# Patient Record
Sex: Male | Born: 1960
Health system: Southern US, Community
[De-identification: ages and names within clinical notes are randomized; demographics above are authoritative.]

## PROBLEM LIST (undated history)

## (undated) DIAGNOSIS — I2510 Atherosclerotic heart disease of native coronary artery without angina pectoris: Secondary | ICD-10-CM

## (undated) DIAGNOSIS — R519 Headache, unspecified: Secondary | ICD-10-CM

## (undated) DIAGNOSIS — H3530 Unspecified macular degeneration: Secondary | ICD-10-CM

## (undated) DIAGNOSIS — I639 Cerebral infarction, unspecified: Secondary | ICD-10-CM

## (undated) DIAGNOSIS — I011 Acute rheumatic endocarditis: Secondary | ICD-10-CM

## (undated) DIAGNOSIS — I5022 Chronic systolic (congestive) heart failure: Secondary | ICD-10-CM

## (undated) DIAGNOSIS — E7800 Pure hypercholesterolemia, unspecified: Secondary | ICD-10-CM

## (undated) DIAGNOSIS — F32A Depression, unspecified: Secondary | ICD-10-CM

## (undated) DIAGNOSIS — Z96651 Presence of right artificial knee joint: Secondary | ICD-10-CM

## (undated) DIAGNOSIS — G4730 Sleep apnea, unspecified: Secondary | ICD-10-CM

## (undated) DIAGNOSIS — J342 Deviated nasal septum: Secondary | ICD-10-CM

## (undated) DIAGNOSIS — M7542 Impingement syndrome of left shoulder: Secondary | ICD-10-CM

## (undated) DIAGNOSIS — M069 Rheumatoid arthritis, unspecified: Secondary | ICD-10-CM

## (undated) DIAGNOSIS — E785 Hyperlipidemia, unspecified: Secondary | ICD-10-CM

## (undated) DIAGNOSIS — I429 Cardiomyopathy, unspecified: Secondary | ICD-10-CM

## (undated) DIAGNOSIS — F419 Anxiety disorder, unspecified: Secondary | ICD-10-CM

## (undated) HISTORY — PX: TONSILLECTOMY: SUR1361

## (undated) HISTORY — PX: KNEE ARTHROSCOPY: SUR90

## (undated) HISTORY — PX: ANKLE SURGERY: SHX546

## (undated) HISTORY — PX: FRACTURE SURGERY: SHX138

## (undated) HISTORY — DX: Cerebral infarction, unspecified: I63.9

## (undated) HISTORY — DX: Hyperlipidemia, unspecified: E78.5

## (undated) HISTORY — DX: Presence of right artificial knee joint: Z96.651

## (undated) HISTORY — DX: Impingement syndrome of left shoulder: M75.42

## (undated) HISTORY — DX: Deviated nasal septum: J34.2

## (undated) HISTORY — DX: Sleep apnea, unspecified: G47.30

## (undated) HISTORY — DX: Atherosclerotic heart disease of native coronary artery without angina pectoris: I25.10

## (undated) HISTORY — DX: Chronic systolic (congestive) heart failure: I50.22

---

## 2007-06-20 ENCOUNTER — Encounter: Admission: RE | Admit: 2007-06-20 | Discharge: 2007-06-20 | Payer: Self-pay | Admitting: Orthopedic Surgery

## 2008-08-30 ENCOUNTER — Encounter: Admission: RE | Admit: 2008-08-30 | Discharge: 2008-08-30 | Payer: Self-pay | Admitting: Internal Medicine

## 2009-07-25 ENCOUNTER — Ambulatory Visit (HOSPITAL_BASED_OUTPATIENT_CLINIC_OR_DEPARTMENT_OTHER): Admission: RE | Admit: 2009-07-25 | Discharge: 2009-07-25 | Payer: Self-pay | Admitting: Orthopedic Surgery

## 2010-07-29 LAB — BASIC METABOLIC PANEL
Chloride: 104 mEq/L (ref 96–112)
GFR calc Af Amer: >60 mL/min (ref >60)
Potassium: 4.3 mEq/L (ref 3.5–5.1)

## 2010-07-29 LAB — POCT HEMOGLOBIN-HEMACUE: Hemoglobin: 13.2 g/dL (ref 13.0–17.0)

## 2010-10-31 ENCOUNTER — Other Ambulatory Visit: Payer: Self-pay | Admitting: Rheumatology

## 2010-10-31 DIAGNOSIS — R0602 Shortness of breath: Secondary | ICD-10-CM

## 2010-11-01 ENCOUNTER — Ambulatory Visit
Admission: RE | Admit: 2010-11-01 | Discharge: 2010-11-01 | Disposition: A | Discharge status: Home / Self Care | Payer: 59 | Source: Ambulatory Visit | Attending: Rheumatology | Admitting: Rheumatology

## 2010-11-01 ENCOUNTER — Other Ambulatory Visit: Payer: Self-pay | Admitting: Rheumatology

## 2010-11-01 DIAGNOSIS — R0602 Shortness of breath: Secondary | ICD-10-CM

## 2010-11-01 MED ORDER — IOHEXOL 300 MG/ML  SOLN
100.0000 mL | Freq: Once | INTRAMUSCULAR | Status: AC | PRN
  Administered 2010-11-01: 100 mL via INTRAVENOUS

## 2010-11-04 ENCOUNTER — Other Ambulatory Visit: Payer: Self-pay | Admitting: Rheumatology

## 2010-11-20 ENCOUNTER — Ambulatory Visit
Admission: RE | Admit: 2010-11-20 | Discharge: 2010-11-20 | Disposition: A | Discharge status: Home / Self Care | Payer: 59 | Source: Ambulatory Visit | Attending: Internal Medicine | Admitting: Internal Medicine

## 2010-11-20 ENCOUNTER — Other Ambulatory Visit: Payer: Self-pay | Admitting: Internal Medicine

## 2010-11-20 DIAGNOSIS — R109 Unspecified abdominal pain: Secondary | ICD-10-CM

## 2010-11-20 MED ORDER — IOHEXOL 300 MG/ML  SOLN
125.0000 mL | Freq: Once | INTRAMUSCULAR | Status: AC | PRN
  Administered 2010-11-20: 125 mL via INTRAVENOUS

## 2011-04-11 ENCOUNTER — Other Ambulatory Visit: Payer: Self-pay | Admitting: Otolaryngology

## 2011-04-14 ENCOUNTER — Inpatient Hospital Stay (HOSPITAL_COMMUNITY): Admission: RE | Admit: 2011-04-14 | Payer: 59 | Source: Ambulatory Visit

## 2011-05-21 ENCOUNTER — Other Ambulatory Visit (HOSPITAL_COMMUNITY): Payer: 59

## 2011-06-18 ENCOUNTER — Encounter (HOSPITAL_COMMUNITY): Payer: Self-pay | Admitting: Pharmacy Technician

## 2011-06-18 ENCOUNTER — Other Ambulatory Visit: Payer: Self-pay

## 2011-06-18 ENCOUNTER — Encounter (HOSPITAL_COMMUNITY): Payer: Self-pay

## 2011-06-18 ENCOUNTER — Encounter (HOSPITAL_COMMUNITY)
Admission: RE | Admit: 2011-06-18 | Discharge: 2011-06-18 | Disposition: A | Discharge status: Home / Self Care | Payer: 59 | Source: Ambulatory Visit | Attending: Otolaryngology | Admitting: Otolaryngology

## 2011-06-18 HISTORY — DX: Acute rheumatic endocarditis: I01.1

## 2011-06-18 HISTORY — DX: Sleep apnea, unspecified: G47.30

## 2011-06-18 LAB — CBC
HCT: 38.9 % — ABNORMAL LOW (ref 39.0–52.0)
Hemoglobin: 13.5 g/dL (ref 13.0–17.0)
RDW: 13.4 % (ref 11.5–15.5)
WBC: 9.8 10*3/uL (ref 4.0–10.5)

## 2011-06-18 LAB — SURGICAL PCR SCREEN
MRSA, PCR: NEGATIVE
Staphylococcus aureus: NEGATIVE

## 2011-06-18 NOTE — Pre-Procedure Instructions (Signed)
20 Christopher Yu  06/18/2011   Your procedure is scheduled on:  Wednesday Feb. 20, 2013  Report to [facility] at 0630 AM.  Call this number if you have problems the morning of surgery: [phone]   Remember:   Do not eat food:After Midnight.  May have clear liquids: up to 4 Hours before arrival. ( up to 2:30am)  Clear liquids include soda, tea, black coffee, apple or grape juice, broth.  Take these medicines the morning of surgery with A SIP OF WATER: omeprazole, sulfasalazine, prednisone, hydrocodone, plaquenil, sertraline   Do not wear jewelry, make-up or nail polish.  Do not wear lotions, powders, or perfumes. You may wear deodorant.  Do not shave 48 hours prior to surgery.  Do not bring valuables to the hospital.  Contacts, dentures or bridgework may not be worn into surgery.  Leave suitcase in the car. After surgery it may be brought to your room.  For patients admitted to the hospital, checkout time is 11:00 AM the day of discharge.   Patients discharged the day of surgery will not be allowed to drive home.  Name and phone number of your driver: [name] [phone]  Special Instructions: CHG Shower Use Special Wash: 1/2 bottle night before surgery and 1/2 bottle morning of surgery.   Please read over the following fact sheets that you were given: Pain Booklet, Coughing and Deep Breathing, MRSA Information and Surgical Site Infection Prevention

## 2011-06-18 NOTE — Progress Notes (Signed)
Contacted Dr. Noralee Space office, spoke with Elita Quick in  Medical records requested sleep study and CXR.

## 2011-06-19 ENCOUNTER — Encounter (HOSPITAL_COMMUNITY): Payer: Self-pay | Admitting: *Deleted

## 2011-06-24 MED ORDER — CEFAZOLIN SODIUM-DEXTROSE 2-3 GM-% IV SOLR
2.0000 g | INTRAVENOUS | Status: AC
  Administered 2011-06-25: 2 g via INTRAVENOUS

## 2011-06-24 MED ORDER — DEXAMETHASONE SODIUM PHOSPHATE 10 MG/ML IJ SOLN
10.0000 mg | Freq: Once | INTRAMUSCULAR | Status: AC
  Administered 2011-06-25: 10 mg via INTRAVENOUS

## 2011-06-25 ENCOUNTER — Encounter (HOSPITAL_COMMUNITY)
Admission: RE | Disposition: A | Discharge status: Home / Self Care | Payer: Self-pay | Source: Ambulatory Visit | Attending: Otolaryngology

## 2011-06-25 ENCOUNTER — Ambulatory Visit (HOSPITAL_COMMUNITY)
Admission: RE | Admit: 2011-06-25 | Discharge: 2011-06-26 | Disposition: A | Discharge status: Home / Self Care | Payer: 59 | Source: Ambulatory Visit | Attending: Otolaryngology | Admitting: Otolaryngology

## 2011-06-25 ENCOUNTER — Encounter (HOSPITAL_COMMUNITY): Payer: Self-pay | Admitting: Anesthesiology

## 2011-06-25 ENCOUNTER — Encounter (HOSPITAL_COMMUNITY): Payer: Self-pay | Admitting: *Deleted

## 2011-06-25 ENCOUNTER — Ambulatory Visit (HOSPITAL_COMMUNITY): Payer: 59 | Admitting: Anesthesiology

## 2011-06-25 ENCOUNTER — Encounter (HOSPITAL_COMMUNITY): Payer: Self-pay | Admitting: Otolaryngology

## 2011-06-25 ENCOUNTER — Encounter (HOSPITAL_COMMUNITY): Payer: Self-pay

## 2011-06-25 DIAGNOSIS — Z01810 Encounter for preprocedural cardiovascular examination: Secondary | ICD-10-CM | POA: Insufficient documentation

## 2011-06-25 DIAGNOSIS — J343 Hypertrophy of nasal turbinates: Secondary | ICD-10-CM | POA: Insufficient documentation

## 2011-06-25 DIAGNOSIS — J342 Deviated nasal septum: Secondary | ICD-10-CM

## 2011-06-25 DIAGNOSIS — J3489 Other specified disorders of nose and nasal sinuses: Secondary | ICD-10-CM | POA: Insufficient documentation

## 2011-06-25 DIAGNOSIS — Z126 Encounter for screening for malignant neoplasm of bladder: Secondary | ICD-10-CM | POA: Insufficient documentation

## 2011-06-25 DIAGNOSIS — G4733 Obstructive sleep apnea (adult) (pediatric): Secondary | ICD-10-CM

## 2011-06-25 HISTORY — PX: NASAL SEPTOPLASTY W/ TURBINOPLASTY: SHX2070

## 2011-06-25 HISTORY — DX: Deviated nasal septum: J34.2

## 2011-06-25 SURGERY — SEPTOPLASTY, NOSE, WITH NASAL TURBINATE REDUCTION
Anesthesia: General | Site: Nose | Laterality: Bilateral | Wound class: Clean Contaminated

## 2011-06-25 MED ORDER — METHOTREXATE 2.5 MG PO TABS: 25.0000 mg | ORAL_TABLET | ORAL | Status: DC

## 2011-06-25 MED ORDER — MORPHINE SULFATE 4 MG/ML IJ SOLN
2.0000 mg | INTRAMUSCULAR | Status: DC | PRN
  Administered 2011-06-25 (×4): 4 mg via INTRAVENOUS
  Filled 2011-06-25 (×4): qty 1

## 2011-06-25 MED ORDER — LACTATED RINGERS IV SOLN
INTRAVENOUS | Status: DC | PRN
  Administered 2011-06-25 (×2): via INTRAVENOUS

## 2011-06-25 MED ORDER — FOLIC ACID 1 MG PO TABS
1.0000 mg | ORAL_TABLET | Freq: Every day | ORAL | Status: DC
  Administered 2011-06-26: 1 mg via ORAL
  Filled 2011-06-25: qty 1

## 2011-06-25 MED ORDER — CEFAZOLIN SODIUM 1-5 GM-% IV SOLN
1.0000 g | Freq: Three times a day (TID) | INTRAVENOUS | Status: DC
  Administered 2011-06-25 – 2011-06-26 (×2): 1 g via INTRAVENOUS
  Filled 2011-06-25 (×4): qty 50

## 2011-06-25 MED ORDER — HYDROMORPHONE HCL PF 1 MG/ML IJ SOLN
0.2500 mg | INTRAMUSCULAR | Status: DC | PRN
  Administered 2011-06-25 (×3): 0.5 mg via INTRAVENOUS

## 2011-06-25 MED ORDER — ACETAMINOPHEN 160 MG/5ML PO SOLN
650.0000 mg | ORAL | Status: DC | PRN
  Filled 2011-06-25: qty 20.3

## 2011-06-25 MED ORDER — SERTRALINE HCL 100 MG PO TABS
100.0000 mg | ORAL_TABLET | Freq: Every day | ORAL | Status: DC
  Administered 2011-06-26: 100 mg via ORAL
  Filled 2011-06-25: qty 1

## 2011-06-25 MED ORDER — MIDAZOLAM HCL 5 MG/5ML IJ SOLN
INTRAMUSCULAR | Status: DC | PRN
  Administered 2011-06-25: 2 mg via INTRAVENOUS

## 2011-06-25 MED ORDER — MIDAZOLAM HCL 2 MG/2ML IJ SOLN: 1.0000 mg | INTRAMUSCULAR | Status: DC | PRN

## 2011-06-25 MED ORDER — PROMETHAZINE HCL 25 MG/ML IJ SOLN: 6.2500 mg | INTRAMUSCULAR | Status: DC | PRN

## 2011-06-25 MED ORDER — HYDROMORPHONE HCL PF 1 MG/ML IJ SOLN
INTRAMUSCULAR | Status: AC
  Filled 2011-06-25: qty 1

## 2011-06-25 MED ORDER — ACETAMINOPHEN 650 MG RE SUPP: 650.0000 mg | RECTAL | Status: DC | PRN

## 2011-06-25 MED ORDER — ONDANSETRON HCL 4 MG/2ML IJ SOLN: 4.0000 mg | INTRAMUSCULAR | Status: DC | PRN

## 2011-06-25 MED ORDER — OXYMETAZOLINE HCL 0.05 % NA SOLN
NASAL | Status: DC | PRN
  Administered 2011-06-25: 1 via NASAL

## 2011-06-25 MED ORDER — PREDNISOLONE 5 MG PO TABS: 10.0000 mg | ORAL_TABLET | Freq: Every day | ORAL | Status: DC

## 2011-06-25 MED ORDER — ETANERCEPT 50 MG/ML ~~LOC~~ SOLN: 50.0000 mg | SUBCUTANEOUS | Status: DC

## 2011-06-25 MED ORDER — ONDANSETRON HCL 4 MG/2ML IJ SOLN
INTRAMUSCULAR | Status: DC | PRN
  Administered 2011-06-25: 4 mg via INTRAVENOUS

## 2011-06-25 MED ORDER — PANTOPRAZOLE SODIUM 40 MG PO TBEC: 80.0000 mg | DELAYED_RELEASE_TABLET | Freq: Every day | ORAL | Status: DC

## 2011-06-25 MED ORDER — ONDANSETRON HCL 4 MG PO TABS: 4.0000 mg | ORAL_TABLET | ORAL | Status: DC | PRN

## 2011-06-25 MED ORDER — FENTANYL CITRATE 0.05 MG/ML IJ SOLN
50.0000 ug | INTRAMUSCULAR | Status: DC | PRN
  Administered 2011-06-25: 100 ug via INTRAVENOUS
  Administered 2011-06-25: 150 ug via INTRAVENOUS

## 2011-06-25 MED ORDER — ROCURONIUM BROMIDE 100 MG/10ML IV SOLN
INTRAVENOUS | Status: DC | PRN
  Administered 2011-06-25: 50 mg via INTRAVENOUS

## 2011-06-25 MED ORDER — NEOSTIGMINE METHYLSULFATE 1 MG/ML IJ SOLN
INTRAMUSCULAR | Status: DC | PRN
  Administered 2011-06-25: 5 mg via INTRAVENOUS

## 2011-06-25 MED ORDER — MUPIROCIN CALCIUM 2 % EX CREA
TOPICAL_CREAM | CUTANEOUS | Status: DC | PRN
  Administered 2011-06-25: 1 via TOPICAL

## 2011-06-25 MED ORDER — LORAZEPAM 2 MG/ML IJ SOLN: 1.0000 mg | Freq: Once | INTRAMUSCULAR | Status: DC | PRN

## 2011-06-25 MED ORDER — LIDOCAINE-EPINEPHRINE 1 %-1:100000 IJ SOLN
INTRAMUSCULAR | Status: DC | PRN
  Administered 2011-06-25: 20 mL

## 2011-06-25 MED ORDER — HYDROCODONE-ACETAMINOPHEN 5-325 MG PO TABS
1.0000 | ORAL_TABLET | Freq: Four times a day (QID) | ORAL | Status: DC | PRN
  Administered 2011-06-26: 1 via ORAL
  Filled 2011-06-25: qty 1

## 2011-06-25 MED ORDER — PREDNISONE 10 MG PO TABS
10.0000 mg | ORAL_TABLET | Freq: Every day | ORAL | Status: DC
  Administered 2011-06-26: 10 mg via ORAL
  Filled 2011-06-25 (×2): qty 1

## 2011-06-25 MED ORDER — PHENYLEPHRINE HCL 10 MG/ML IJ SOLN
INTRAMUSCULAR | Status: DC | PRN
  Administered 2011-06-25 (×2): 80 ug via INTRAVENOUS

## 2011-06-25 MED ORDER — SULFASALAZINE 500 MG PO TABS
500.0000 mg | ORAL_TABLET | Freq: Two times a day (BID) | ORAL | Status: DC
  Administered 2011-06-25 – 2011-06-26 (×2): 500 mg via ORAL
  Filled 2011-06-25 (×4): qty 1

## 2011-06-25 MED ORDER — PROPOFOL 10 MG/ML IV EMUL
INTRAVENOUS | Status: DC | PRN
  Administered 2011-06-25: 130 mg via INTRAVENOUS

## 2011-06-25 MED ORDER — GLYCOPYRROLATE 0.2 MG/ML IJ SOLN
INTRAMUSCULAR | Status: DC | PRN
  Administered 2011-06-25: .7 mg via INTRAVENOUS

## 2011-06-25 MED ORDER — HYDROXYCHLOROQUINE SULFATE 200 MG PO TABS
400.0000 mg | ORAL_TABLET | Freq: Every day | ORAL | Status: DC
  Administered 2011-06-26: 400 mg via ORAL
  Filled 2011-06-25: qty 2

## 2011-06-25 MED ORDER — METHOTREXATE 2.5 MG PO TABS: 25.0000 mg | ORAL_TABLET | Freq: Once | ORAL | Status: DC

## 2011-06-25 MED ORDER — KCL-LACTATED RINGERS-D5W 20 MEQ/L IV SOLN
INTRAVENOUS | Status: DC
  Administered 2011-06-25: 14:00:00 via INTRAVENOUS
  Filled 2011-06-25 (×4): qty 1000

## 2011-06-25 SURGICAL SUPPLY — 31 items
BLADE SURG 15 STRL LF DISP TIS (BLADE) ×1 IMPLANT
BLADE SURG 15 STRL SS (BLADE) ×1
CANISTER SUCTION 2500CC (MISCELLANEOUS) ×2 IMPLANT
CLOTH BEACON ORANGE TIMEOUT ST (SAFETY) ×2 IMPLANT
COAGULATOR SUCT SWTCH 10FR 6 (ELECTROSURGICAL) IMPLANT
ELECT REM PT RETURN 9FT ADLT (ELECTROSURGICAL) ×2
ELECTRODE REM PT RTRN 9FT ADLT (ELECTROSURGICAL) ×1 IMPLANT
GAUZE SPONGE 2X2 8PLY STRL LF (GAUZE/BANDAGES/DRESSINGS) IMPLANT
GLOVE BIOGEL M 7.0 STRL (GLOVE) ×4 IMPLANT
GLOVE ECLIPSE 6.5 STRL STRAW (GLOVE) ×2 IMPLANT
GOWN STRL NON-REIN LRG LVL3 (GOWN DISPOSABLE) ×4 IMPLANT
KIT BASIN OR (CUSTOM PROCEDURE TRAY) ×2 IMPLANT
KIT ROOM TURNOVER OR (KITS) ×2 IMPLANT
NS IRRIG 1000ML POUR BTL (IV SOLUTION) ×2 IMPLANT
PAD ARMBOARD 7.5X6 YLW CONV (MISCELLANEOUS) ×4 IMPLANT
SPLINT NASAL DOYLE BI-VL (GAUZE/BANDAGES/DRESSINGS) ×2 IMPLANT
SPONGE GAUZE 2X2 STER 10/PKG (GAUZE/BANDAGES/DRESSINGS)
SPONGE NEURO XRAY DETECT 1X3 (DISPOSABLE) ×2 IMPLANT
SUCTION FRAZIER TIP 10 FR DISP (SUCTIONS) ×2 IMPLANT
SUCTION FRAZIER TIP 8 FR DISP (SUCTIONS) ×1
SUCTION TUBE FRAZIER 8FR DISP (SUCTIONS) ×1 IMPLANT
SUT ETHILON 3 0 PS 1 (SUTURE) ×2 IMPLANT
SUT PLAIN 4 0 ~~LOC~~ 1 (SUTURE) ×2 IMPLANT
TAPE SURG TRANSPORE 1 IN (GAUZE/BANDAGES/DRESSINGS) ×1 IMPLANT
TAPE SURGICAL TRANSPORE 1 IN (GAUZE/BANDAGES/DRESSINGS) ×1
TOWEL OR 17X24 6PK STRL BLUE (TOWEL DISPOSABLE) ×2 IMPLANT
TOWEL OR 17X26 10 PK STRL BLUE (TOWEL DISPOSABLE) ×2 IMPLANT
TRAY ENT MC OR (CUSTOM PROCEDURE TRAY) ×2 IMPLANT
TUBE SALEM SUMP 16 FR W/ARV (TUBING) ×2 IMPLANT
TUBING EXTENTION W/L.L. (IV SETS) ×2 IMPLANT
WATER STERILE IRR 1000ML POUR (IV SOLUTION) IMPLANT

## 2011-06-25 NOTE — Brief Op Note (Signed)
06/25/2011  10:00 AM  PATIENT:  Christopher Yu  51 y.o. male  PRE-OPERATIVE DIAGNOSIS:  1. DEVIATED NASAL SEPTUM 2. Turbinate Hypertrophy 3. Nasal Adhesions 4. Left Ethmoid Sinusitis 5. OSA     POST-OPERATIVE DIAGNOSIS:   1. DEVIATED NASAL SEPTUM 2. Turbinate Hypertrophy 3. Nasal Adhesions 4. Left Ethmoid Sinusitis 5. OSA     PROCEDURE:  Procedure(s) (LRB): NASAL SEPTOPLASTY WITH TURBINATE REDUCTION (Bilateral) Left Anterior Ethmoidectomy  SURGEON:  Surgeon(s) and Role:    * [name], MD - Primary  PHYSICIAN ASSISTANT:   ASSISTANTS: none   ANESTHESIA:   general  EBL:  Total I/O In: 1600 [I.V.:1600] Out: -   BLOOD ADMINISTERED:none  DRAINS: none   LOCAL MEDICATIONS USED: Lidocaine 5 cc  SPECIMEN:  No Specimen  DISPOSITION OF SPECIMEN:  N/A  COUNTS:  YES  TOURNIQUET:  * No tourniquets in log *  DICTATION: .Other Dictation: Dictation Number [id number]  PLAN OF CARE: Admit for overnight observation  PATIENT DISPOSITION:  PACU - hemodynamically stable.   Delay start of Pharmacological VTE agent (>24hrs) due to surgical blood loss or risk of bleeding: no

## 2011-06-25 NOTE — Anesthesia Postprocedure Evaluation (Signed)
  Anesthesia Post-op Note  Patient: Christopher Yu  Procedure(s) Performed: Procedure(s) (LRB): NASAL SEPTOPLASTY WITH TURBINATE REDUCTION (Bilateral)  Patient Location: PACU  Anesthesia Type: General  Level of Consciousness: awake  Airway and Oxygen Therapy: Patient Spontanous Breathing  Post-op Pain: mild  Post-op Assessment: Post-op Vital signs reviewed, Patient's Cardiovascular Status Stable, Respiratory Function Stable, Patent Airway, No signs of Nausea or vomiting and Pain level controlled  Post-op Vital Signs: stable  Complications: No apparent anesthesia complications

## 2011-06-25 NOTE — Transfer of Care (Signed)
Immediate Anesthesia Transfer of Care Note  Patient: Christopher Yu  Procedure(s) Performed: Procedure(s) (LRB): NASAL SEPTOPLASTY WITH TURBINATE REDUCTION (Bilateral)  Patient Location: PACU  Anesthesia Type: General  Level of Consciousness: awake, alert  and oriented  Airway & Oxygen Therapy: Patient Spontanous Breathing and Patient connected to face mask oxygen  Post-op Assessment: Report given to PACU RN, Post -op Vital signs reviewed and stable and Patient moving all extremities  Post vital signs: Reviewed and stable  Complications: No apparent anesthesia complications

## 2011-06-25 NOTE — H&P (Signed)
Christopher Yu is an 51 y.o. male.   Chief Complaint: Nasal Obstruction and OSA HPI: Chronic hx of prog nasal airway obstr  Past Medical History  Diagnosis Date  . Rheumatoid aortitis   . Sleep apnea     does not use cpap   . Pneumonia     as a child    Past Surgical History  Procedure Date  . Knee arthroscopy     right x 4  . Fracture surgery     left femur fracture x 3  . Tonsillectomy     No family history on file. Social History:  reports that he has quit smoking. He does not have any smokeless tobacco history on file. He reports that he does not drink alcohol or use illicit drugs.  Allergies: No Known Allergies  Medications Prior to Admission  Medication Dose Route Frequency Provider Last Rate Last Dose  . ceFAZolin (ANCEF) IVPB 2 g/50 mL premix  2 g Intravenous 60 min Pre-Op [name], MD      . dexamethasone (DECADRON) injection 10 mg  10 mg Intravenous Once    10 mg at 06/25/11 0816  . fentaNYL (SUBLIMAZE) injection 50-100 mcg  50-100 mcg Intravenous PRN [name], MD      . midazolam (VERSED) injection 1-2 mg  1-2 mg Intravenous PRN [name], MD       No current outpatient prescriptions on file as of 06/25/2011.    No results found for this or any previous visit (from the past 48 hour(s)). No results found.  Review of Systems  Constitutional: Negative.   HENT: Positive for congestion.   Respiratory: Negative.   Cardiovascular: Negative.   Gastrointestinal: Negative.   Musculoskeletal: Negative.   Skin: Negative.     Blood pressure 129/78, pulse 59, temperature 99.7 F (37.6 C), temperature source Oral, resp. rate 18, SpO2 97.00%. Physical Exam  Constitutional: He is oriented to person, place, and time. He appears well-developed and well-nourished.  HENT:       Severe nasal septal deviation, nasal obstr  Neck: Normal range of motion. Neck supple.  Cardiovascular: Normal rate and regular rhythm.   Respiratory: Effort normal and breath sounds  normal.  GI: Soft. Bowel sounds are normal.  Musculoskeletal: Normal range of motion.  Neurological: He is alert and oriented to person, place, and time.     Assessment/Plan Pt adm for Septoplasty and turb reduction. O/N obs for OSA  [name] L 06/25/2011, 8:33 AM

## 2011-06-25 NOTE — Progress Notes (Signed)
06/25/2011 5:48 PM  Christopher Yu [id number]  Post-Op check    Temp:  [97.7 F (36.5 C)-99.7 F (37.6 C)] 97.8 F (36.6 C) (02/20 1700) Pulse Rate:  [51-93] 65  (02/20 1715) Resp:  [7-20] 14  (02/20 1715) BP: (109-131)/(69-80) 109/69 mmHg (02/20 1715) SpO2:  [90 %-97 %] 93 % (02/20 1715) Weight:  [99.5 kg (219 lb 5.7 oz)] 99.5 kg (219 lb 5.7 oz) (02/20 1300),     Intake/Output Summary (Last 24 hours) at 06/25/11 1748 Last data filed at 06/25/11 1700  Gross per 24 hour  Intake   1600 ml  Output   1200 ml  Net    400 ml   Good po fluids.  Good spontaneous void.  SUBJECTIVE:  C/o nasal congestion.  Min pain.  Some bloody drainage in pharynx.  Min pain.  OBJECTIVE:  Color, energy good.  Voice cl. Pharynx moist and clear.  IMPRESSION:  Satisfactory check.  PLAN:  Advance diet.  Begin saline nasal spray.  Anticipate D/C in AM.  Flo Shanks T

## 2011-06-25 NOTE — Anesthesia Preprocedure Evaluation (Addendum)
Anesthesia Evaluation  Patient identified by MRN, date of birth, ID band Patient awake    Reviewed: Allergy & Precautions, H&P , NPO status , Patient's Chart, lab work & pertinent test results  Airway Mallampati: II TM Distance: >3 FB Neck ROM: Full    Dental   Pulmonary sleep apnea ,    Pulmonary exam normal       Cardiovascular     Neuro/Psych    GI/Hepatic   Endo/Other    Renal/GU      Musculoskeletal  (+) Arthritis -,   Abdominal   Peds  Hematology   Anesthesia Other Findings   Reproductive/Obstetrics                           Anesthesia Physical Anesthesia Plan  ASA: II  Anesthesia Plan: General   Post-op Pain Management:    Induction: Intravenous  Airway Management Planned: Oral ETT  Additional Equipment:   Intra-op Plan:   Post-operative Plan: Extubation in OR  Informed Consent: I have reviewed the patients History and Physical, chart, labs and discussed the procedure including the risks, benefits and alternatives for the proposed anesthesia with the patient or authorized representative who has indicated his/her understanding and acceptance.     Plan Discussed with: CRNA and Surgeon  Anesthesia Plan Comments:        Anesthesia Quick Evaluation

## 2011-06-25 NOTE — Op Note (Signed)
NAMEVESTAL, Christopher NO.:  Yu  MEDICAL RECORD NO.:  [id number]  LOCATION:  2602                         FACILITY:  [facility]  PHYSICIAN:  Christopher Yu, M.D.DATE OF BIRTH:  [birth year]  DATE OF PROCEDURE: DATE OF DISCHARGE:                              OPERATIVE REPORT   PREPROCEDURE DIAGNOSES: 1. Nasal septal deviation. 2. Inferior turbinate hypertrophy. 3. Bilateral nasal synechiae. 4. Status post previous sinonasal surgery. 5. Obstructive sleep apnea.  POSTPROCEDURE DIAGNOSES: 1. Nasal septal deviation. 2. Inferior turbinate hypertrophy. 3. Bilateral nasal synechiae. 4. Status post previous sinonasal surgery. 5. Obstructive sleep apnea.  INDICATION FOR SURGERY: 1. Nasal septal deviation. 2. Inferior turbinate hypertrophy. 3. Bilateral nasal synechiae. 4. Status post previous sinonasal surgery. 5. Obstructive sleep apnea.  SURGICAL PROCEDURE: 1. Revision of nasal septoplasty. 2. Bilateral inferior turbinate reduction. 3. Left anterior ethmoidectomy.  ANESTHESIA:  General endotracheal.  COMPLICATIONS:  None.  ESTIMATED BLOOD LOSS:  Less than 50 mL.  The patient was transferred from the operating room to the recovery room in stable condition.  FINDINGS:  Dense bilateral nasal septal adhesions/synechiae.  Dense scarring in the left middle meatus and the lateral nasal wall occluding the anterior ethmoid region.  Thick mucoid material within the left middle meatus, no evidence of polyps or active infection. Anterior ethmoidectomy performed to improve sinus patency and reduce the risk of long-term scarring.  BRIEF HISTORY:  The patient is a 51 year old male referred to our office with a history of progressive symptoms of nasal airway obstruction and obstructive sleep apnea.  He had undergone previous sinonasal surgery many years ago for similar complaints.  Initially had significant improvement in breathing and congestion but  developed worsening symptoms over time with significant nasal airway obstruction, unable to tolerate CPAP secondary to nasal blockage, and given his history, examination and physical findings, I recommended we consider him for the above surgical procedures.  Examination showed a severely scarred nasal septum with multiple adhesions and synechiae between the nasal septum on the lateral nasal wall, unable to adequately visualize the nasal cavity on the patient's left.  Given the history and examination, we recommended the above procedures, primarily revision septoplasty and turbinate reduction.  Intraoperatively, the patient was found to have scarring and adhesions of the left middle turbinate, the lateral nasal wall with obstruction of the middle meatus and an anterior ethmoidectomy was performed to improve nasal patency and reduce the risk of long-term scarring.  Risks, benefits, possible complications of these procedures were discussed in detail with the patient who understood and concurred to our plan for surgery.  He has a history of obstructive sleep apnea, and postoperative recovery was scheduled with overnight observation in step-down.  PROCEDURE:  The patient was brought to the operating room on June 25, 2011, and placed in supine position on the operating table.  General endotracheal anesthesia was established without difficulty.  When the patient adequately anesthetized, he was positioned on the operating table and then prepped and draped in the sterile fashion.  The patient's nose was then injected with a total of 5 mL of 1% lidocaine 1:100,000 solution epinephrine was injected in submucosal fashion along the nasal septum and inferior turbinates bilaterally.  The patient's nose was then packed with Afrin-soaked cottonoid pledgets and were left in place for approximately 10 minutes for vasoconstriction hemostasis.  The patient was positioned, prepped and draped, and the  surgical procedure was begun.  With the patient prepared for surgery, nasal packing was removed, and nasal cavity was examined.  The patient had dense bilateral synechiae with scar tissue between the nasal septum and inferior middle turbinates bilaterally.  The procedure was begun with division of these scar bands using a #15 scalpel blade and a Freer elevator to carefully preserve the mucosa but to lyse the adhesions to allow access to more patent nasal cavity.  With this aspect of the procedure completed, nasal cavity was inspected, and the patient was found to have additional dense adhesions between the left middle turbinate and the left lateral nasal wall with near complete obstruction of the anterior ethmoid region and thick mucoid secretions.  Given that finding, I opted to also undertake an anterior ethmoidectomy during the surgical procedure.  A left anterior hemitransfixion incision was created, and a mucoperichondrial flap was elevated from anterior to posterior along the left-hand side preserving the overlying mucosa.  The patient had undergone previous nasal septoplasty, cartilage was still intact.  This was left into good midline position, but posterior cartilage and bone was resected in order to bring the septum to midline position.  There was a large bony septal spur on the left, which was resected preserving the overlying mucosa. The mucoperichondrial flaps were reapproximated with a 4-0 gut suture on a Keith needle in a horizontal mattressing fashion, and the anterior hemitransfixion incision was closed with the same stitch.  At the conclusion of the procedure, bilateral Doyle nasal septal splints were placed after the application of Bactroban ointment and sutured into position with a 3-0 Ethilon suture.  Inferior turbinate reduction was then performed with cautery set at 12 watts 2 submucosal passes were made in each inferior turbinate where the turbinates have been  adequately cauterized, anterior incisions were created, a small amount of turbinate bone was resected while preserving the overlying mucosa.  The turbinates were then outfractured creating more patent nasal cavity.  Given the dense mucosal adhesions along the left middle turbinate and lateral nasal wall, an incision was created using a Therapist, nutritional and a 15 scalpel blade to lyse adhesions and elevate the middle turbinate into a better medialized position within the ethmoid cavity.  There was a moderate amount of polypoid disease and thick mucoid discharge.  This was carefully resected using thru-cutting forceps creating a patent anterior ethmoid region.  The remainder of the sinuses, maxillary, frontal, and posterior ethmoid were not implemented to allow for better observation and perhaps additional procedures as needed in the future. With the sinus portion of procedure completed, and the patient's nasal cavity was inspected.  There was no evidence of infection or polyps.  No significant active bleeding.  An orogastric tube was passed.  Stomach contents were aspirated.  The Doyle nasal septal splints were placed lateral to the middle turbinates to maintain patency within the middle meatus bilaterally and anterior transection stitch was made at the anterior aspect of the each middle turbinate to the septum in order to help stabilize the middle turbinates in the best anatomic position.  The splints were placed after the application of Bactroban ointment over suture and positioned with a 3-0 Ethilon suture.  The patient was then awakened from his anesthetic, extubated, and then transferred from the operating room to the recovery  room in stable condition.          ______________________________ Christopher Scales Annalee Yu, M.D.     DLS/MEDQ  D:  40/98/1191  T:  06/25/2011  Job:  [id number]

## 2011-06-26 MED ORDER — AMOXICILLIN-POT CLAVULANATE 500-125 MG PO TABS: 1.0000 | ORAL_TABLET | Freq: Two times a day (BID) | ORAL | Status: AC

## 2011-06-26 MED FILL — Dextrose 5% w/ Sodium Chloride 0.45%: INTRAVENOUS | Qty: 1000 | Status: AC

## 2011-06-26 NOTE — Progress Notes (Signed)
Order to discharge patient.  IV d/ced without problems.  Taken off oxygen and sats stayed greater than 92, pt denies SOB.  Reviewed all d/c instructions with pt including handouts on new medication and rx.  Reviewed when to call md and f/u appt.  Discussed s/s of infection including IV site and to call md if any signs or symptoms noted.  Pt verbalized understanding of all instructions and denied further questions.  Written copy on instructions given.  Awaiting wife to d/c via WC.

## 2011-06-26 NOTE — Discharge Instructions (Signed)
Deviated Septum Deviated septum is a shift of the nasal septum away from the midline. The nasal septum is a wall that divides the nasal cavity into halves. Normally, the nasal septum is straight and is located exactly in the middle of the nasal cavity. In many people, it is bent towards the left or right. Symptoms occur when there is a severe shift from the midline. Difficulty in breathing through the nose is the most common symptom. The crooked septum can block the drainage of the air-filled spaces within the bones of the face (sinuses) causing repeated sinus infection. Surgery can be done to correct the deviation. Surgery is usually not recommended in minors as the septum grows till 50 years of age.  SYMPTOMS  People with mild deviation of the nasal septum usually do not have any symptoms. Symptoms develop when the deviation is severe. The common symptoms are:  Blockage in one or both sides of the nose.   Nasal congestion or stuffy nose.   Frequent nosebleeds.   Frequent sinus infection.   Headache.   Facial pain.   Excess collection of mucus at the back of the throat or nose (postnasal drip).   Noisy breathing while sleeping.  Some people with mild deviation have symptoms only when they get common cold. The deviated septum causes frequent inflammation of the sinuses (sinusitis) by blocking the sinus. DIAGNOSIS   During the first visit, your caregiver will ask about your symptoms.   Your caregiver will examine the appearance of your nose.   You will be asked about any previous injury that may have caused damage to your nose.   You will be asked about any previous nose surgeries.   Your caregiver will check the position of your nasal septum.   Your caregiver may use a flashlight and a device used to widen your nostril (nasal speculum).   Your caregiver may suggest other tests, such as a computerized X-ray scan (CT or CAT scan), if needed.  TREATMENT  Mild deviation of the septum  does not need any treatment. Your caregiver may advise a surgery to correct the deviated septum (septoplasty), if the deviation is severe. This surgery is done through your nose. No bruising will be visible outside. Your caregiver may instruct you to stop taking aspirin and other blood thinning medicines. Listen to your caregiver regarding those medicines. You will be given local or general anesthesia. Your surgeon will remove or adjust the septum and place it in the correct position. The entire procedure takes about 1 to 2 hours. You will be given a nasal pack to control the bleeding. The patient typically keeps the nasal pack in until the first follow-up visit after surgery. Hospital admission is not needed for this. Your surgeon might also do a sinus surgery along with this surgery, if needed. Sinus surgery entails opening the ostia (openings) of the sinuses to allow better drainage. Once deviated septum has been fixed, larger openings allow for better drainage. This leads to decreased incidence in sinus infections. HOME CARE INSTRUCTIONS   Follow your caregiver's advice after surgery.   Do not blow your nose.   Do not hold your breath.   Use ice packs to reduce pain.   For a few days, tighten your muscles while bearing down during bowel movement.   If bleeding occurs that exceeds nasal packing, and it does not stop with gentle pressure, call your caregiver.  MAKE SURE YOU:   Understand these instructions.   Will watch your condition.  Will get help right away if you are not doing well or get worse.  Document Released: 02/16/2007 Document Revised: 01/01/2011 Document Reviewed: 02/16/2007 [facility] Patient Information 2012 [place], [place].

## 2011-06-26 NOTE — Discharge Summary (Signed)
Physician Discharge Summary  Patient ID: Christopher Yu MRN: [id number] DOB/AGE: [birth year] 51 y.o.  Admit date: 06/25/2011 Discharge date: 06/26/2011  Admission Diagnoses:  Principal Problem:  *Deviated nasal septum Active Problems:  Obstructive apnea   Discharge Diagnoses:  Same  Surgeries: Procedure(s): NASAL SEPTOPLASTY WITH TURBINATE REDUCTION on 06/25/2011   Consultants: None  Discharged Condition: Improved  Hospital Course: Christopher Yu is an 51 y.o. male who was admitted 06/25/2011 with a diagnosis of Deviated nasal septum and went to the operating room on 06/25/2011 and underwent the above named procedures.   Pt stable O/N, no airway or respiratory concerns. D/C to home.  Recent vital signs:  Filed Vitals:   06/26/11 0700  BP: 109/68  Pulse: 50  Temp: 97.7 F (36.5 C)  Resp: 13    Recent laboratory studies:  Results for orders placed during the hospital encounter of 06/18/11  CBC      Component Value Range   WBC 9.8  4.0 - 10.5 (K/uL)   RBC 4.40  4.22 - 5.81 (MIL/uL)   Hemoglobin 13.5  13.0 - 17.0 (g/dL)   HCT 13.0 (*) 86.5 - 52.0 (%)   MCV 88.4  78.0 - 100.0 (fL)   MCH 30.7  26.0 - 34.0 (pg)   MCHC 34.7  30.0 - 36.0 (g/dL)   RDW 78.4  69.6 - 29.5 (%)   Platelets 225  150 - 400 (K/uL)  SURGICAL PCR SCREEN      Component Value Range   MRSA, PCR NEGATIVE  NEGATIVE    Staphylococcus aureus NEGATIVE  NEGATIVE     Discharge Medications:   Medication List  As of 06/26/2011  8:01 AM   STOP taking these medications         aspirin EC 81 MG tablet         TAKE these medications         amoxicillin-clavulanate 500-125 MG per tablet   Commonly known as: AUGMENTIN   Take 1 tablet (500 mg total) by mouth 2 (two) times daily.      etanercept 50 MG/ML injection   Commonly known as: ENBREL   Inject 50 mg into the skin once a week. On Sunday      folic acid 1 MG tablet   Commonly known as: FOLVITE   Take 1 mg by mouth daily.     HYDROcodone-acetaminophen 5-500 MG per tablet   Commonly known as: VICODIN   Take 1 tablet by mouth every 6 (six) hours as needed. For pain      hydroxychloroquine 200 MG tablet   Commonly known as: PLAQUENIL   Take 400 mg by mouth daily.      methotrexate 2.5 MG tablet   Commonly known as: RHEUMATREX   Take 25 mg by mouth once a week. On Saturday.     Caution:Chemotherapy. Protect from light.      omeprazole 20 MG capsule   Commonly known as: PRILOSEC   Take 20 mg by mouth daily.      prednisoLONE 5 MG Tabs   Take 10 mg by mouth daily.      sertraline 100 MG tablet   Commonly known as: ZOLOFT   Take 100 mg by mouth daily.      sulfaSALAzine 500 MG tablet   Commonly known as: AZULFIDINE   Take 500 mg by mouth 2 (two) times daily.            Diagnostic Studies: No results found.  Disposition: Final discharge  disposition not confirmed  Discharge Orders    Future Orders Please Complete By Expires   Diet - low sodium heart healthy      Increase activity slowly      Discharge instructions      Comments:   1. Limited activity 2. Normal diet 3. May bathe and shower 4. Saline nasal spray - 4 puffs/nostril every hour while awake 5. Elevate Head of Bed 6. No nose blowing      Follow-up Information    Follow up with Lala Been L, MD in 2 weeks.   Contact information:   [address] [place] [address] [phone]           Signed: [name] 06/26/2011, 8:01 AM

## 2011-06-26 NOTE — Progress Notes (Signed)
Patient ID: Christopher Yu, male   DOB: 04/13/1961, 51 y.o.   MRN: [id number]   ENT Progress Note: POD #1 s/p Procedure(s): NASAL SEPTOPLASTY WITH TURBINATE REDUCTION   Subjective: Pt stable, no complaints  Objective: Vital signs in last 24 hours: Temp:  [97.6 F (36.4 C)-98.9 F (37.2 C)] 97.9 F (36.6 C) (02/21 0555) Pulse Rate:  [51-93] 60  (02/21 0555) Resp:  [7-21] 21  (02/21 0555) BP: (96-131)/(55-83) 96/55 mmHg (02/21 0555) SpO2:  [90 %-96 %] 94 % (02/21 0555) Weight:  [99.5 kg (219 lb 5.7 oz)-105.4 kg (232 lb 5.8 oz)] 105.4 kg (232 lb 5.8 oz) (02/21 0011) Weight change:  Last BM Date: 06/25/11  Intake/Output from previous day: 02/20 0701 - 02/21 0700 In: 2600 [I.V.:2500; IV Piggyback:100] Out: 2800 [Urine:2800] Intake/Output this shift:    Labs:  Studies/Results: No results found.   PHYSICAL EXAM: Min bloody d/c Splints in-place Airway stable   Assessment/Plan: D/C to home    Christopher Yu L 06/26/2011, 7:50 AM

## 2011-07-01 ENCOUNTER — Encounter (HOSPITAL_COMMUNITY): Payer: Self-pay | Admitting: Otolaryngology

## 2011-09-26 ENCOUNTER — Ambulatory Visit (HOSPITAL_BASED_OUTPATIENT_CLINIC_OR_DEPARTMENT_OTHER): Payer: 59 | Attending: Otolaryngology

## 2011-09-26 VITALS — Ht 74.0 in | Wt 220.0 lb

## 2011-09-26 DIAGNOSIS — G4733 Obstructive sleep apnea (adult) (pediatric): Secondary | ICD-10-CM | POA: Insufficient documentation

## 2011-10-04 DIAGNOSIS — G4733 Obstructive sleep apnea (adult) (pediatric): Secondary | ICD-10-CM

## 2011-10-04 NOTE — Procedures (Signed)
NAME:  Christopher Yu, Christopher Yu             ACCOUNT NO.:  [id number]  MEDICAL RECORD NO.:  [id number]          PATIENT TYPE:  OUT  LOCATION:  SLEEP CENTER                 FACILITY[facility]  [facility]  PHYSICIAN:  Zurisadai Helminiak D. Maple Hudson, MD, FCCP, FACPDATE OF BIRTH:  27-Jan-1962  DATE OF STUDY:  09/26/2011                           NOCTURNAL POLYSOMNOGRAM  REFERRING PHYSICIAN:  Onalee Hua L. Annalee Genta, M.D.  INDICATION FOR STUDY:  Hypersomnia with sleep apnea.  EPWORTH SLEEPINESS SCORE:  7/24.  BMI 28.2, weight 220 pounds, height 74 inches, neck 16 inches.  HOME MEDICATIONS:  Charted and reviewed.  SLEEP ARCHITECTURE:  Total sleep time 355.5 minutes with sleep efficiency 91.9%.  Stage I was 10.1%, stage II 62.2%, stage III 2.8%, REM 24.9% of total sleep time.  Sleep latency 19.5 minutes, REM latency 84.5 minutes.  Awake after sleep onset 12 minutes.  Arousal index of 12.7.  Bedtime medication:  None.  RESPIRATORY DATA:  Apnea-hypopnea index (AHI) 10.1 per hour.  A total of 60 events was scored including 16 obstructive apneas, 1 central apnea, 43 hypopneas.  Events were more common while supine.  REM AHI 10.2 per hour.  There were insufficient early events to permit application of CPAP by split protocol CPAP titration on this study night.  Most respiratory events developed around 4 to 5 a.m.  OXYGEN DATA:  Very loud snoring with oxygen desaturation to a nadir of 83% and mean oxygen saturation through the study of 92.8% on room air.  CARDIAC DATA:  Sinus rhythm with PACs and PVCs.  MOVEMENT/PARASOMNIA:  Twenty-four limb jerks were counted, of which 4 were associated with arousals or awakening for periodic limb movement with arousal index of 0.7 per hour.  No bathroom trips.  IMPRESSION/RECOMMENDATION: 1. Mild obstructive sleep apnea/hypopnea syndrome, apnea-hypopnea     index 10.1 per hour with events mostly associated with supine sleep     and rapid eye movement.  Very loud snoring with oxygen desaturation     to a nadir of 83% and mean oxygen saturation through the study of     92.8% on room air. 2. There were insufficient early events to meet protocol requirements     for initiation of split protocol continuous     positive airway pressure titration on this study night.  Return for     continuous positive airway pressure titration is an option if     clinically appropriate.     [name], MD, [facility] Bollman, FACP Diplomate, American Board of Sleep Medicine    CDY/MEDQ  D:  10/04/2011 08:48:17  T:  10/04/2011 09:10:19  Job:  [id number]

## 2011-12-12 ENCOUNTER — Ambulatory Visit (HOSPITAL_BASED_OUTPATIENT_CLINIC_OR_DEPARTMENT_OTHER): Payer: 59 | Attending: Otolaryngology

## 2011-12-12 VITALS — Ht 74.0 in | Wt 220.0 lb

## 2011-12-12 DIAGNOSIS — G4710 Hypersomnia, unspecified: Secondary | ICD-10-CM | POA: Insufficient documentation

## 2011-12-12 DIAGNOSIS — Z9989 Dependence on other enabling machines and devices: Secondary | ICD-10-CM

## 2011-12-12 DIAGNOSIS — G4730 Sleep apnea, unspecified: Secondary | ICD-10-CM | POA: Insufficient documentation

## 2011-12-12 DIAGNOSIS — G4733 Obstructive sleep apnea (adult) (pediatric): Secondary | ICD-10-CM

## 2011-12-21 DIAGNOSIS — G4710 Hypersomnia, unspecified: Secondary | ICD-10-CM

## 2011-12-21 DIAGNOSIS — G4730 Sleep apnea, unspecified: Secondary | ICD-10-CM

## 2011-12-22 ENCOUNTER — Other Ambulatory Visit: Payer: Self-pay | Admitting: Rheumatology

## 2011-12-22 DIAGNOSIS — M069 Rheumatoid arthritis, unspecified: Secondary | ICD-10-CM

## 2011-12-22 NOTE — Procedures (Signed)
NAME:  Christopher Yu, Christopher Yu             ACCOUNT NO.:  [id number]  MEDICAL RECORD NO.:  [id number]          PATIENT TYPE:  OUT  LOCATION:  SLEEP CENTER                 FACILITY[facility]  [facility]  PHYSICIAN:  Clinton D. Maple Hudson, MD, FCCP, FACPDATE OF BIRTH:  07[birth year]03-1961  DATE OF STUDY:  12/12/2011                           NOCTURNAL POLYSOMNOGRAM  REFERRING PHYSICIAN:  Onalee Hua L. Annalee Genta, M.D.  INDICATION FOR STUDY:  Hypersomnia with sleep apnea.  EPWORTH SLEEPINESS SCORE:  6/24.  BMI 28.2, weight 220 pounds, height 74 inches, neck 16 inches.  MEDICATIONS:  Home medications charted and reviewed.  A baseline diagnostic NPSG on Sep 26, 2011, had recorded an AHI of 10.1 per hour with body weight 220 pounds.  CPAP titration is now requested.  SLEEP ARCHITECTURE:  Total sleep time 393.5 minutes with sleep efficiency 92.4%.  Stage I was 3.7%, stage II 62.8%.  Stage III 1.7%, REM 31.9% of total sleep time.  Sleep latency 12.5 minutes, REM latency 112 minutes.  Awake after sleep onset 15 minutes.  Arousal index 5.8.  Bedtime medication:  None.  RESPIRATORY DATA:  CPAP titration protocol.  CPAP was titrated to 8 CWP, AHI 0 per hour.  He wore a medium ResMed Quattro FX full-face mask with heated humidifier.  OXYGEN DATA:  Snoring was prevented by CPAP, and mean oxygen saturation held at 93.1% on room air.  CARDIAC DATA:  Sinus rhythm with occasional PVC.  MOVEMENT-PARASOMNIA:  A few limb jerks were noted with insignificant effect on sleep.  IMPRESSION-RECOMMENDATIONS: 1. Successful CPAP titration to 8 CWP, AHI 0 per hour.  He wore a     medium ResMed Quattro FX full-face mask with heated humidifier.     Snoring was prevented, oxygen saturation maintained at a mean of     93.1% on room air through the study, and percentage time spent in     REM was high, suggesting REM rebound with restoration of normal     airway function. 2. Baseline diagnostic NPSG on Sep 26, 2011, had recorded AHI 10.1 per  hour.  Body weight was 200 pounds for the initial study.     Clinton D. Maple Hudson, MD, [facility], FACP Diplomate, American Board of Sleep Medicine    CDY/MEDQ  D:  12/21/2011 13:22:16  T:  12/22/2011 03:25:27  Job:  [id number]

## 2011-12-24 ENCOUNTER — Ambulatory Visit
Admission: RE | Admit: 2011-12-24 | Discharge: 2011-12-24 | Disposition: A | Discharge status: Home / Self Care | Payer: 59 | Source: Ambulatory Visit | Attending: Rheumatology | Admitting: Rheumatology

## 2011-12-24 ENCOUNTER — Other Ambulatory Visit: Payer: Self-pay | Admitting: Rheumatology

## 2011-12-24 DIAGNOSIS — M069 Rheumatoid arthritis, unspecified: Secondary | ICD-10-CM

## 2011-12-24 MED ORDER — IOHEXOL 180 MG/ML  SOLN
2.0000 mL | Freq: Once | INTRAMUSCULAR | Status: AC | PRN
  Administered 2011-12-24: 2 mL via INTRA_ARTICULAR

## 2011-12-24 MED ORDER — METHYLPREDNISOLONE ACETATE 40 MG/ML INJ SUSP (RADIOLOG
120.0000 mg | Freq: Once | INTRAMUSCULAR | Status: AC
  Administered 2011-12-24: 120 mg via INTRA_ARTICULAR

## 2012-05-05 HISTORY — PX: ANKLE FUSION: SHX881

## 2013-05-25 HISTORY — PX: ANKLE FUSION: SHX881

## 2013-06-06 ENCOUNTER — Other Ambulatory Visit (HOSPITAL_COMMUNITY): Payer: Self-pay | Admitting: Rheumatology

## 2013-06-06 ENCOUNTER — Encounter (HOSPITAL_COMMUNITY)
Admission: RE | Admit: 2013-06-06 | Discharge: 2013-06-06 | Disposition: A | Discharge status: Home / Self Care | Payer: 59 | Source: Ambulatory Visit | Attending: Rheumatology | Admitting: Rheumatology

## 2013-06-06 ENCOUNTER — Encounter (HOSPITAL_COMMUNITY): Payer: Self-pay

## 2013-06-06 DIAGNOSIS — M069 Rheumatoid arthritis, unspecified: Secondary | ICD-10-CM | POA: Insufficient documentation

## 2013-06-06 HISTORY — DX: Rheumatoid arthritis, unspecified: M06.9

## 2013-06-06 MED ORDER — SODIUM CHLORIDE 0.9 % IV SOLN
Freq: Every day | INTRAVENOUS | Status: DC
  Administered 2013-06-06: 09:00:00 via INTRAVENOUS

## 2013-06-06 MED ORDER — HYDROCORTISONE NA SUCCINATE PF 100 MG IJ SOLR
100.0000 mg | Freq: Every day | INTRAMUSCULAR | Status: DC
  Administered 2013-06-06: 100 mg via INTRAVENOUS
  Filled 2013-06-06: qty 2

## 2013-06-06 MED ORDER — LORATADINE 10 MG PO TABS
10.0000 mg | ORAL_TABLET | Freq: Every day | ORAL | Status: DC
  Administered 2013-06-06: 10 mg via ORAL
  Filled 2013-06-06: qty 1

## 2013-06-06 MED ORDER — ACETAMINOPHEN 325 MG PO TABS
650.0000 mg | ORAL_TABLET | Freq: Every day | ORAL | Status: DC
  Administered 2013-06-06: 650 mg via ORAL
  Filled 2013-06-06: qty 2

## 2013-06-06 MED ORDER — SODIUM CHLORIDE 0.9 % IV SOLN
1000.0000 mg | Freq: Every day | INTRAVENOUS | Status: DC
  Administered 2013-06-06: 1000 mg via INTRAVENOUS
  Filled 2013-06-06 (×2): qty 100

## 2013-06-06 NOTE — Progress Notes (Signed)
Pt here for # 1of 2 infusion of Rituxan. Pt did not take Zyrtec at home last night so I called the office and spoke to [place] ( for Dr Nickola Major) Rip Harbour to give Claritin po (along with other premeds) pre infusion while in short stay today and for the future infusion. She states she had tried to call patient on Friday and asked that I have him call office today .This message was relayed to patient. Pt had" Left Ankle fusion surgery at [facility] in [place] 05/25/13" and Dr Nickola Major office was made aware of this by short stay staff. Pt is OK for this infusion today per Morrie Sheldon ( per Dr Nickola Major)

## 2013-06-06 NOTE — Progress Notes (Signed)
Uneventful infusion of #1 of 2 Rituxan with next scheduled appointment for 06/20/13

## 2013-06-06 NOTE — Discharge Instructions (Signed)
Rituximab injection What is this medicine? RITUXIMAB (ri TUX i mab) is a monoclonal antibody. This medicine changes the way the body's immune system works. It is used commonly to treat non-Hodgkin's lymphoma and other conditions. In cancer cells, this drug targets a specific protein within cancer cells and stops the cancer cells from growing. It is also used to treat rhuematoid arthritis (RA). In RA, this medicine slow the inflammatory process and help reduce joint pain and swelling. This medicine is often used with other cancer or arthritis medications. This medicine may be used for other purposes; ask your health care provider or pharmacist if you have questions. COMMON BRAND NAME(S): Rituxan What should I tell my health care provider before I take this medicine? They need to know if you have any of these conditions: -blood disorders -heart disease -history of hepatitis B -infection (especially a virus infection such as chickenpox, cold sores, or herpes) -irregular heartbeat -kidney disease -lung or breathing disease, like asthma -lupus -an unusual or allergic reaction to rituximab, mouse proteins, other medicines, foods, dyes, or preservatives -pregnant or trying to get pregnant -breast-feeding How should I use this medicine? This medicine is for infusion into a vein. It is administered in a hospital or clinic by a specially trained health care professional. A special MedGuide will be given to you by the pharmacist with each prescription and refill. Be sure to read this information carefully each time. Talk to your pediatrician regarding the use of this medicine in children. This medicine is not approved for use in children. Overdosage: If you think you have taken too much of this medicine contact a poison control center or emergency room at once. NOTE: This medicine is only for you. Do not share this medicine with others. What if I miss a dose? It is important not to miss a dose. Call  your doctor or health care professional if you are unable to keep an appointment. What may interact with this medicine? -cisplatin -medicines for blood pressure -some other medicines for arthritis -vaccines This list may not describe all possible interactions. Give your health care provider a list of all the medicines, herbs, non-prescription drugs, or dietary supplements you use. Also tell them if you smoke, drink alcohol, or use illegal drugs. Some items may interact with your medicine. What should I watch for while using this medicine? Report any side effects that you notice during your treatment right away, such as changes in your breathing, fever, chills, dizziness or lightheadedness. These effects are more common with the first dose. Visit your prescriber or health care professional for checks on your progress. You will need to have regular blood work. Report any other side effects. The side effects of this medicine can continue after you finish your treatment. Continue your course of treatment even though you feel ill unless your doctor tells you to stop. Call your doctor or health care professional for advice if you get a fever, chills or sore throat, or other symptoms of a cold or flu. Do not treat yourself. This drug decreases your body's ability to fight infections. Try to avoid being around people who are sick. This medicine may increase your risk to bruise or bleed. Call your doctor or health care professional if you notice any unusual bleeding. Be careful brushing and flossing your teeth or using a toothpick because you may get an infection or bleed more easily. If you have any dental work done, tell your dentist you are receiving this medicine. Avoid taking products  that contain aspirin, acetaminophen, ibuprofen, naproxen, or ketoprofen unless instructed by your doctor. These medicines may hide a fever. Do not become pregnant while taking this medicine. Women should inform their doctor  if they wish to become pregnant or think they might be pregnant. There is a potential for serious side effects to an unborn child. Talk to your health care professional or pharmacist for more information. Do not breast-feed an infant while taking this medicine. What side effects may I notice from receiving this medicine? Side effects that you should report to your doctor or health care professional as soon as possible: -allergic reactions like skin rash, itching or hives, swelling of the face, lips, or tongue -low blood counts - this medicine may decrease the number of white blood cells, red blood cells and platelets. You may be at increased risk for infections and bleeding. -signs of infection - fever or chills, cough, sore throat, pain or difficulty passing urine -signs of decreased platelets or bleeding - bruising, pinpoint red spots on the skin, black, tarry stools, blood in the urine -signs of decreased red blood cells - unusually weak or tired, fainting spells, lightheadedness -breathing problems -confused, not responsive -chest pain -fast, irregular heartbeat -feeling faint or lightheaded, falls -mouth sores -redness, blistering, peeling or loosening of the skin, including inside the mouth -stomach pain -swelling of the ankles, feet, or hands -trouble passing urine or change in the amount of urine Side effects that usually do not require medical attention (report to your doctor or other health care professional if they continue or are bothersome): -anxiety -headache -loss of appetite -muscle aches -nausea -night sweats This list may not describe all possible side effects. Call your doctor for medical advice about side effects. You may report side effects to FDA at 1-800-FDA-1088. Where should I keep my medicine? This drug is given in a hospital or clinic and will not be stored at home. NOTE: This sheet is a summary. It may not cover all possible information. If you have questions  about this medicine, talk to your doctor, pharmacist, or health care provider.  2014, Elsevier/Gold Standard. (2007-12-20 14:04:59)  Rheumatoid Arthritis Rheumatoid arthritis is a long-term (chronic) inflammatory disease that causes pain, swelling, and stiffness of the joints. It can affect the entire body, including the eyes and lungs. The effects of rheumatoid arthritis vary widely among those with the condition. CAUSES  The cause of rheumatoid arthritis is not known. It tends to run in families and is more common in women. Certain cells of the body's natural defense system (immune system) do not work properly and begin to attack healthy joints. It primarily involves the connective tissue that lines the joints (synovial membrane). This can cause damage to the joint. SYMPTOMS   Pain, stiffness, swelling, and decreased motion of many joints, especially in the hands and feet.  Stiffness that is worse in the morning. It may last 1 2 hours or longer.  Numbness and tingling in the hands.  Fatigue.  Loss of appetite.  Weight loss.  Low-grade fever.  Dry eyes and mouth.  Firm lumps (rheumatoid nodules) that grow beneath the skin in areas such as the elbows and hands. DIAGNOSIS  Diagnosis is based on the symptoms described, an exam, and blood tests. Sometimes, X-rays are helpful. TREATMENT  The goals of treatment are to relieve pain, reduce inflammation, and to slow down or stop joint damage and disability. Methods vary and may include:  Maintaining a balance of rest, exercise, and proper nutrition.  Medicines:  Pain relievers (analgesics).  Corticosteroids and nonsteroidal anti-inflammatory drugs (NSAIDs) to reduce inflammation.  Disease-modifying antirheumatic drugs (DMARDs) to try to slow the course of the disease.  Biologic response modifiers to reduce inflammation and damage.  Physical therapy and occupational therapy.  Surgery for patients with severe joint damage. Joint  replacement or fusing of joints may be needed.  Routine monitoring and ongoing care, such as office visits, blood and urine tests, and X-rays. HOME CARE INSTRUCTIONS   Remain physically active and reduce activity when the disease gets worse.  Eat a well-balanced diet.  Put heat on affected joints when you wake up and before activities. Keep the heat on the affected joint for as long as directed by your caregiver.  Put ice on affected joints following activities or exercising.  Put ice in a plastic bag.  Place a towel between your skin and the bag.  Leave the ice on for 15-20 minutes, 03-04 times a day.  Take all medicines and supplements as directed by your caregiver.  Use splints as directed by your caregiver. Splints help maintain joint position and function.  Do not sleep with pillows under your knees. This may lead to spasms.  Participate in a self-management program to keep current with the latest treatment and coping skills. SEEK IMMEDIATE MEDICAL CARE IF:  You have fainting episodes.  You have periods of extreme weakness.  You rapidly develop a hot, painful joint that is more severe than usual joint aches.  You have chills.  You have a fever. MAKE SURE YOU:  Understand these instructions.  Will watch your condition.  Will get help right away if you are not doing well or get worse. FOR MORE INFORMATION  American College of Rheumatology: www.rheumatology.org Arthritis Foundation: www.arthritis.org Document Released: 04/18/2000 Document Revised: 10/21/2011 Document Reviewed: 05/28/2011 [facility] Patient Information 2014 [place], [place].

## 2013-06-20 ENCOUNTER — Encounter (HOSPITAL_COMMUNITY)
Admission: RE | Admit: 2013-06-20 | Discharge: 2013-06-20 | Disposition: A | Discharge status: Home / Self Care | Payer: 59 | Source: Ambulatory Visit | Attending: Rheumatology | Admitting: Rheumatology

## 2013-06-20 ENCOUNTER — Encounter (HOSPITAL_COMMUNITY): Payer: Self-pay

## 2013-06-20 ENCOUNTER — Other Ambulatory Visit (HOSPITAL_COMMUNITY): Payer: Self-pay | Admitting: Rheumatology

## 2013-06-20 MED ORDER — LORATADINE 10 MG PO TABS
10.0000 mg | ORAL_TABLET | Freq: Every day | ORAL | Status: AC
  Administered 2013-06-20: 10 mg via ORAL
  Filled 2013-06-20: qty 1

## 2013-06-20 MED ORDER — METHYLPREDNISOLONE SODIUM SUCC 125 MG IJ SOLR
100.0000 mg | Freq: Once | INTRAMUSCULAR | Status: AC
  Administered 2013-06-20: 100 mg via INTRAVENOUS
  Filled 2013-06-20: qty 1.6

## 2013-06-20 MED ORDER — ACETAMINOPHEN 325 MG PO TABS
650.0000 mg | ORAL_TABLET | Freq: Every day | ORAL | Status: AC
  Administered 2013-06-20: 650 mg via ORAL
  Filled 2013-06-20: qty 2

## 2013-06-20 MED ORDER — SODIUM CHLORIDE 0.9 % IV SOLN
Freq: Every day | INTRAVENOUS | Status: AC
  Administered 2013-06-20: 08:00:00 via INTRAVENOUS

## 2013-06-20 MED ORDER — SODIUM CHLORIDE 0.9 % IV SOLN
1000.0000 mg | Freq: Every day | INTRAVENOUS | Status: AC
  Administered 2013-06-20: 1000 mg via INTRAVENOUS
  Filled 2013-06-20: qty 100

## 2013-06-20 NOTE — Discharge Instructions (Signed)

## 2013-06-20 NOTE — Progress Notes (Signed)
Uneventful infusion #2 of 2 Rituxan today. Pt discharged ambulatory with his crutches to meet wife at car

## 2015-04-17 ENCOUNTER — Other Ambulatory Visit (HOSPITAL_COMMUNITY): Payer: Self-pay | Admitting: Orthopaedic Surgery

## 2015-04-19 NOTE — Pre-Procedure Instructions (Signed)
GLOVER CAPANO  04/19/2015      [id number] - [place], [place] - [address] [place] [address] Phone: [phone] Fax: [phone]    Your procedure is scheduled on Tuesday, Dec. 27th   Report to [facility] Admitting at 5:30 AM  Call this number if you have problems the morning of surgery:  [phone]   Remember:  Do not eat food or drink liquids after midnight Monday.  Take these medicines the morning of surgery with A SIP OF WATER : Hydrocodone   Do not wear jewelry - no rings or watches.  Do not wear lotions or colognes.   You may NOT wear deodorant the morning of surgery.             Men may shave face and neck.   Do not bring valuables to the hospital.  [facility] is not responsible for any belongings or valuables.  Contacts, dentures or bridgework may not be worn into surgery.  Leave your suitcase in the car.  After surgery it may be brought to your room. For patients admitted to the hospital, discharge time will be determined by your treatment team.  Name and phone number of your driver:     Please read over the following fact sheets that you were given. Pain Booklet, Coughing and Deep Breathing, MRSA Information and Surgical Site Infection Prevention

## 2015-04-20 ENCOUNTER — Encounter (HOSPITAL_COMMUNITY): Payer: Self-pay

## 2015-04-20 ENCOUNTER — Encounter (HOSPITAL_COMMUNITY)
Admission: RE | Admit: 2015-04-20 | Discharge: 2015-04-20 | Disposition: A | Discharge status: Home / Self Care | Payer: 59 | Source: Ambulatory Visit | Attending: Orthopaedic Surgery | Admitting: Orthopaedic Surgery

## 2015-04-20 DIAGNOSIS — G4733 Obstructive sleep apnea (adult) (pediatric): Secondary | ICD-10-CM | POA: Diagnosis not present

## 2015-04-20 DIAGNOSIS — Z87891 Personal history of nicotine dependence: Secondary | ICD-10-CM | POA: Diagnosis not present

## 2015-04-20 DIAGNOSIS — Z01812 Encounter for preprocedural laboratory examination: Secondary | ICD-10-CM | POA: Insufficient documentation

## 2015-04-20 DIAGNOSIS — Z01818 Encounter for other preprocedural examination: Secondary | ICD-10-CM | POA: Diagnosis present

## 2015-04-20 DIAGNOSIS — Z7982 Long term (current) use of aspirin: Secondary | ICD-10-CM | POA: Insufficient documentation

## 2015-04-20 DIAGNOSIS — M179 Osteoarthritis of knee, unspecified: Secondary | ICD-10-CM | POA: Insufficient documentation

## 2015-04-20 DIAGNOSIS — I444 Left anterior fascicular block: Secondary | ICD-10-CM | POA: Insufficient documentation

## 2015-04-20 DIAGNOSIS — Z79899 Other long term (current) drug therapy: Secondary | ICD-10-CM | POA: Insufficient documentation

## 2015-04-20 DIAGNOSIS — M069 Rheumatoid arthritis, unspecified: Secondary | ICD-10-CM | POA: Diagnosis not present

## 2015-04-20 LAB — BASIC METABOLIC PANEL
ANION GAP: 9 (ref 5–15)
BUN: 10 mg/dL (ref 6–20)
CALCIUM: 9.1 mg/dL (ref 8.9–10.3)
CO2: 25 mmol/L (ref 22–32)
Chloride: 107 mmol/L (ref 101–111)
Creatinine, Ser: 1.18 mg/dL (ref 0.61–1.24)
GFR calc non Af Amer: >60 mL/min (ref >60)
Glucose, Bld: 117 mg/dL — ABNORMAL HIGH (ref 65–99)
Potassium: 4.6 mmol/L (ref 3.5–5.1)
Sodium: 141 mmol/L (ref 135–145)

## 2015-04-20 LAB — CBC
HCT: 39.7 % (ref 39.0–52.0)
HEMOGLOBIN: 13.1 g/dL (ref 13.0–17.0)
MCH: 29.9 pg (ref 26.0–34.0)
MCHC: 33 g/dL (ref 30.0–36.0)
MCV: 90.6 fL (ref 78.0–100.0)
Platelets: 193 10*3/uL (ref 150–400)
RBC: 4.38 MIL/uL (ref 4.22–5.81)
RDW: 15.6 % — ABNORMAL HIGH (ref 11.5–15.5)
WBC: 8.3 10*3/uL (ref 4.0–10.5)

## 2015-04-20 LAB — SURGICAL PCR SCREEN
MRSA, PCR: NEGATIVE
Staphylococcus aureus: NEGATIVE

## 2015-04-20 NOTE — Progress Notes (Signed)
Per his rheumatologist, he wanted patient to have stress test back in 2005.  Came out normal. Denies any cardiac issues now, nor has he seen a cardio. Tested for OSA back in 2013, he thinks at [facility].  Test came back positive, but patient is unable to tolerate the mask, so, he doesn't wear it.Marland Kitchen

## 2015-04-23 ENCOUNTER — Encounter (HOSPITAL_COMMUNITY): Payer: Self-pay | Admitting: Emergency Medicine

## 2015-04-23 NOTE — Progress Notes (Signed)
Anesthesia Chart Review:  Pt is 54 year old male scheduled for R total knee arthroplasty on 05/01/2015 with Dr. Maureen Ralphs.   PMH includes:  OSA (no CPAP use), RA. Former smoker. BMI 32. S/p nasal septoplasty 06/25/11.   Medications include: ASA, hydroxychloroquine, leflunomide, methotrexate, prednisone, sulfasalazine.   Preoperative labs reviewed.    EKG 04/20/15: NSR. LAFB. T wave abnormality, consider lateral ischemia. Ischemia is new compared to 06/18/11 tracing.   Reviewed case with Dr. Aleene Davidson. Pt will need cardiac clearance prior to surgery. Left voicemail for Sherrie in Dr. Eliberto Ivory office.   Rica Mast, FNP-BC [facility] Stay [facility]/Anesthesiology Phone: [phone] 04/23/2015 5:04 PM

## 2015-04-25 ENCOUNTER — Encounter: Payer: Self-pay | Admitting: Cardiology

## 2015-04-26 ENCOUNTER — Other Ambulatory Visit: Payer: Self-pay | Admitting: Cardiology

## 2015-04-26 NOTE — Progress Notes (Signed)
Patient ID: Christopher Yu, male   DOB: Aug 25, 1960, 54 y.o.   MRN: [id number]   Christopher Yu  Date of visit:  04/25/2015 DOB:  [birth year]    Age:  54 yrs. Medical record number:  [id number]     Account number:  [id number] Primary Care Provider: Thora Lance ____________________________ CURRENT DIAGNOSES  1. Abnormal electrocardiogram [ECG]  2. Encounter for preprocedural cardiovascular examination ____________________________ ALLERGIES  No Known Allergies ____________________________ MEDICATIONS  1. methotrexate sodium 2.5 mg tablet, 8 q week  2. folic acid 1 mg tablet, 1 p.o. daily  3. Plaquenil 200 mg tablet, 2 p.o. daily  4. Arava 10 mg tablet, 1 p.o. daily  5. sulfasalazine 500 mg tablet, 2 p.o. b.i.d.  6. aspirin 81 mg chewable tablet, 1 p.o. daily  7. hydrocodone 5 mg-acetaminophen 325 mg tablet, TID  8. prednisone 2.5 mg tablet, PRN ____________________________ CHIEF COMPLAINTS  Surg clearance for knee replacement ____________________________ HISTORY OF PRESENT ILLNESS This very nice 54 year old is seen for preoperative cardiac evaluation. The patient has a long-standing history of rheumatoid arthritis. He has been treated with a no improve disease modifying therapist through the years and has been under the care of multiple rheumatologists. He is in the process of switching care. He is currently on prednisone, hydroxychloroquine, Arava, sulfasalzine and methotrexate. His activity is limited because of arthritis and he is unable to exercise. He has no cardiovascular symptoms. He denies angina and has no PND, orthopnea, syncope, palpitations, or claudication. In 2012 he evidently had a CT scan done that showed some coronary calcification advanced for age particularly in the LAD at that time. He reports having had a stress test a number of years ago.  On a preoperative EKG done for upcoming knee replacement surgery next week he was found to have left axis deviation and T wave  inversions in the lateral leads. I was asked to assess him. A previous EKG from 2013 showed upright T waves in the lateral leads. He may have mild hyperlipidemia. ____________________________ PAST HISTORY  Past Medical Illnesses:  rheumatoid arthritis, obesity, denies hypertension or diabetes;  Cardiovascular Illnesses:  CAD (coronary calcification noted on CT scan 2012);  Surgical Procedures:  deviated septum repair, knee surgery, bil ankle surg;  NYHA Classification:  I;  Canadian Angina Classification:  Class 0: Asymptomatic;  Cardiology Procedures-Invasive:  no previous interventional or invasive cardiology procedures;  Cardiology Procedures-Noninvasive:  treadmill cardiolite;  LVEF not documented,   ____________________________ CARDIO-PULMONARY TEST DATES EKG Date:  04/25/2015;   ____________________________ FAMILY HISTORY Brother -- Brother alive and well Father -- Father dead Mother -- Mother alive and well Sister -- Sister alive and well Sister -- Sister alive with problem, Lupus erythematosus, Kidney disorder Sister -- Sister alive and well ____________________________ SOCIAL HISTORY Alcohol Use:  socially;  Smoking:  used to smoke but quit 2008;  Diet:  regular diet;  Lifestyle:  divorced and remarried;  Exercise:  exercise is limited due to physical disability;  Occupation:  Production designer, 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 Weight:  245.00 lbs. Height:  74"BMI: 31  Constitutional:  pleasant white male in no acute distress, mildly obese Skin:  warm and dry to touch, no apparent skin lesions, or masses noted. Head:  normocephalic, normal hair pattern, no masses or tenderness Eyes:  EOMS Intact, PERRLA, C and S clear, Funduscopic exam not done. ENT:  ears, nose and throat reveal no  gross abnormalities.  Dentition good. Neck:  supple, without massess. No JVD, thyromegaly or carotid bruits. Carotid upstroke normal. Chest:  normal symmetry, clear to auscultation. Cardiac:  regular rhythm, normal S1 and S2, No S3 or S4, no murmurs, gallops or rubs detected. Peripheral Pulses:  the femoral,dorsalis pedis, and posterior tibial pulses are full and equal bilaterally with no bruits auscultated. Extremities & Back:  no deformities, clubbing, cyanosis, erythema or edema observed. Normal muscle strength and tone. Neurological:  no gross motor or sensory deficits noted, affect appropriate, oriented x3. ____________________________ IMPRESSIONS/PLAN  1. Abnormal EKG 2. Coronary artery disease as documented by coronary calcification on CT scan several years ago 3. Obesity 4. Rheumatoid arthritis  Recommendations:  He does have coronary calcification which would indicate the presence of established coronary disease. I would recommend prior to surgery that he have a myocardial perfusion scan to be sure that he does not have high risk ischemia. At the present time he is totally asymptomatic. Thank you for asking me to see him with you.  ____________________________ TODAYS ORDERS  1. 12 Lead EKG: Today  2. Lexiscan 1 day: First Available                       ____________________________ Cardiology Physician:  Darden Palmer MD [facility]

## 2015-04-27 ENCOUNTER — Encounter: Payer: Self-pay | Admitting: Cardiology

## 2015-04-27 ENCOUNTER — Other Ambulatory Visit: Payer: Self-pay | Admitting: Cardiology

## 2015-04-27 ENCOUNTER — Ambulatory Visit
Admission: RE | Admit: 2015-04-27 | Discharge: 2015-04-27 | Disposition: A | Discharge status: Home / Self Care | Payer: 59 | Source: Ambulatory Visit | Attending: Cardiology | Admitting: Cardiology

## 2015-04-27 ENCOUNTER — Ambulatory Visit (HOSPITAL_COMMUNITY)
Admission: RE | Admit: 2015-04-27 | Discharge: 2015-04-27 | Disposition: A | Discharge status: Home / Self Care | Payer: 59 | Source: Ambulatory Visit | Attending: Internal Medicine | Admitting: Internal Medicine

## 2015-04-27 DIAGNOSIS — I2510 Atherosclerotic heart disease of native coronary artery without angina pectoris: Secondary | ICD-10-CM

## 2015-04-27 DIAGNOSIS — I429 Cardiomyopathy, unspecified: Secondary | ICD-10-CM

## 2015-04-27 DIAGNOSIS — Z87891 Personal history of nicotine dependence: Secondary | ICD-10-CM | POA: Diagnosis not present

## 2015-04-27 DIAGNOSIS — Z01810 Encounter for preprocedural cardiovascular examination: Secondary | ICD-10-CM

## 2015-04-27 DIAGNOSIS — I517 Cardiomegaly: Secondary | ICD-10-CM | POA: Diagnosis not present

## 2015-04-27 DIAGNOSIS — I351 Nonrheumatic aortic (valve) insufficiency: Secondary | ICD-10-CM | POA: Insufficient documentation

## 2015-04-27 DIAGNOSIS — G4730 Sleep apnea, unspecified: Secondary | ICD-10-CM

## 2015-04-27 DIAGNOSIS — E785 Hyperlipidemia, unspecified: Secondary | ICD-10-CM

## 2015-04-27 DIAGNOSIS — M0609 Rheumatoid arthritis without rheumatoid factor, multiple sites: Secondary | ICD-10-CM

## 2015-04-27 MED ORDER — TRANEXAMIC ACID 1000 MG/10ML IV SOLN
1000.0000 mg | INTRAVENOUS | Status: DC
  Filled 2015-04-27: qty 10

## 2015-04-27 NOTE — Progress Notes (Signed)
Patient ID: Christopher [name], male   DOB: 05/02/1961, 54 y.o.   MRN: [id number]  Christopher [name]  Date of visit:  04/25/2015 DOB:  08/17/1960    Age:  54 yrs. Medical record number:  [id number]     Account number:  [id number] Primary Care Provider: GRIFFIN, JOHN [name] ____________________________ CURRENT DIAGNOSES  1. Abnormal electrocardiogram [ECG]  2. Encounter for preprocedural cardiovascular examination ____________________________ ALLERGIES  No Known Allergies ____________________________ MEDICATIONS  1. methotrexate sodium 2.5 mg tablet, 8 q week  2. folic acid 1 mg tablet, 1 p.o. daily  3. Plaquenil 200 mg tablet, 2 p.o. daily  4. Arava 10 mg tablet, 1 p.o. daily  5. sulfasalazine 500 mg tablet, 2 p.o. b.i.d.  6. aspirin 81 mg chewable tablet, 1 p.o. daily  7. hydrocodone 5 mg-acetaminophen 325 mg tablet, TID  8. prednisone 2.5 mg tablet, PRN ____________________________ CHIEF COMPLAINTS  Surg clearance for knee replacement ____________________________ HISTORY OF PRESENT ILLNESS This very nice 54-year-old is seen for preoperative cardiac evaluation. The patient has a long-standing history of rheumatoid arthritis. He has been treated with a number of  disease modifying therapies through the years and has been under the care of multiple rheumatologists. He is in the process of switching care. He is currently on prednisone, hydroxychloroquine, Arava, sulfasalzine and methotrexate. His activity is limited because of arthritis and she is unable to exercise. He has no cardiovascular symptoms. He denies angina and has no PND, orthopnea, syncope, palpitations, or claudication. In 2012 he evidently had a CT scan done that showed some coronary calcification advanced for age particularly in the LAD at that time. He reports having had a stress test a number of years ago.  On a preoperative EKG done for upcoming knee replacement surgery next week he was found to have left axis deviation and T wave  inversions in the lateral leads. I was asked to assess him. A previous EKG from 2013 showed upright T waves in the lateral leads. He may have mild hyperlipidemia. ____________________________ PAST HISTORY  Past Medical Illnesses:  rheumatoid arthritis, obesity, denies hypertension or diabetes;  Cardiovascular Illnesses:  CAD (coronary calcification noted on CT scan 2012);  Surgical Procedures:  deviated septum repair, knee surgery, bil ankle surg;  NYHA Classification:  I;  Canadian Angina Classification:  Class 0: Asymptomatic;  Cardiology Procedures-Invasive:  no previous interventional or invasive cardiology procedures;  Cardiology Procedures-Noninvasive:  treadmill cardiolite;  LVEF not documented,   ____________________________ CARDIO-PULMONARY TEST DATES EKG Date:  04/25/2015;   ____________________________ FAMILY HISTORY Brother -- Brother alive and well Father -- Father dead Mother -- Mother alive and well Sister -- Sister alive and well Sister -- Sister alive with problem, Lupus erythematosus, Kidney disorder Sister -- Sister alive and well ____________________________ SOCIAL HISTORY Alcohol Use:  socially;  Smoking:  used to smoke but quit 2008;  Diet:  regular diet;  Lifestyle:  divorced and remarried;  Exercise:  exercise is limited due to physical disability;  Occupation:  manager and [employer];  Residence:  lives with wife and children;   ____________________________ PHYSICAL EXAMINATION VITAL SIGNS  Blood Pressure:  124/70 Sitting, Left arm, regular cuff  , 134/74 Standing, Left arm and regular cuff   Pulse:  76/min. Weight:  245.00 lbs. Height:  74"BMI: 31  Constitutional:  pleasant white male in no acute distress, mildly obese Skin:  warm and dry to touch, no apparent skin lesions, or masses noted. Head:  normocephalic, normal hair pattern, no masses or tenderness Eyes:    EOMS Intact, PERRLA, C and S clear, Funduscopic exam not done. ENT:  ears, nose and throat reveal no  gross abnormalities.  Dentition good. Neck:  supple, without massess. No JVD, thyromegaly or carotid bruits. Carotid upstroke normal. Chest:  normal symmetry, clear to auscultation. Cardiac:  regular rhythm, normal S1 and S2, No S3 or S4, no murmurs, gallops or rubs detected. Peripheral Pulses:  the femoral,dorsalis pedis, and posterior tibial pulses are full and equal bilaterally with no bruits auscultated. Extremities & Back:  no deformities, clubbing, cyanosis, erythema or edema observed. Normal muscle strength and tone. Neurological:  no gross motor or sensory deficits noted, affect appropriate, oriented x3. ____________________________ IMPRESSIONS/PLAN  1. Abnormal EKG 2. Coronary artery disease as documented by coronary calcification on CT scan several years ago 3. Obesity 4. Rheumatoid arthritis  Recommendations:  He does have coronary calcification which would indicate the presence of established coronary disease. I would recommend prior to surgery that he have a myocardial perfusion scan to be sure that he does not have high risk ischemia. At the present time he is totally asymptomatic. Thank you for asking me to see him with you.  ____________________________ TODAYS ORDERS  1. 12 Lead EKG: Today  2. Lexiscan 1 day: First Available                       ____________________________ Cardiology Physician:  W. Spencer Myrlene Riera, Jr. MD [facility]     

## 2015-04-27 NOTE — Progress Notes (Signed)
  Echocardiogram 2D Echocardiogram has been performed.  Christopher [name] 04/27/2015, 2:16 PM

## 2015-04-27 NOTE — Progress Notes (Unsigned)
Patient ID: Christopher [name], male   DOB: 21-Jun-1960, 54 y.o.   MRN: [id number]   Christopher [name]  Date of visit:  04/27/2015 DOB:  [birth year]    Age:  54 yrs. Medical record number:  [id number]     Account number:  [id number] Primary Care Provider: Thora [name] ____________________________ CURRENT DIAGNOSES  1. Cardiomyopathy, unspecified  2. Other Rheumatoid Arthritis With Rheumatoid Factor Of Multiple Sites  3. Abnormal electrocardiogram [ECG]  4. Encounter for preprocedural cardiovascular examination ____________________________ ALLERGIES  No Known Allergies ____________________________ MEDICATIONS  1. methotrexate sodium 2.5 mg tablet, 8 q week  2. folic acid 1 mg tablet, 1 p.o. daily  3. Plaquenil 200 mg tablet, 2 p.o. daily  4. Arava 10 mg tablet, 1 p.o. daily  5. sulfasalazine 500 mg tablet, 2 p.o. b.i.d.  6. aspirin 81 mg chewable tablet, 1 p.o. daily  7. hydrocodone 5 mg-acetaminophen 325 mg tablet, TID  8. prednisone 2.5 mg tablet, PRN  9. metoprolol succinate ER 25 mg tablet,extended release 24 hr, 1 p.o. daily  10. lisinopril 10 mg tablet, 1 p.o. daily ____________________________ CHIEF COMPLAINTS  Followup of Abnormal electrocardiogram [ECG] ____________________________ HISTORY OF PRESENT ILLNESS The patient returns for cardiac followup. He had a nuclear perfusion scan yesterday that showed an EF of 39% with global hypokinesis and a fixed inferior wall defect. He was originally seen for preoperative cardiac evaluation but when this was noted this was put on hold. An echocardiogram done earlier at time today confirms ejection fraction of around 40% with global hypokinesis. The echo also shows aortic regurgitation of a mild degree. The patient has an extensive history of rheumatoid arthritis and has received Rutoxin as well as a number of other immunomodulators. He is also on hydroxy chloroquine. He is not symptomatic and denies PND, orthopnea or  edema. ____________________________ PAST HISTORY  Past Medical Illnesses:  rheumatoid arthritis, obesity, denies hypertension or diabetes;  Cardiovascular Illnesses:  CAD (coronary calcification noted on CT scan 2012), cardiomyopathy(idiopathic);  Surgical Procedures:  deviated septum repair, knee surgery, bil ankle surg;  NYHA Classification:  I;  Canadian Angina Classification:  Class 0: Asymptomatic;  Cardiology Procedures-Invasive:  no previous interventional or invasive cardiology procedures;  Cardiology Procedures-Noninvasive:  treadmill cardiolite, adenosine cardiolite December 2016, echocardiogram December 2016;  LVEF of % documented via nuclear study on 04/26/2015,   ____________________________ CARDIO-PULMONARY TEST DATES EKG Date:  04/25/2015;  Nuclear Study Date:  04/26/2015;  Echocardiography Date: 04/27/2015;   ____________________________ FAMILY HISTORY Brother -- Brother alive and well Father -- Father dead Mother -- Mother alive and well Sister -- Sister alive and well Sister -- Sister alive with problem, Lupus erythematosus, Kidney disorder Sister -- Sister alive and well ____________________________ SOCIAL HISTORY Alcohol Use:  socially;  Smoking:  used to smoke but quit 2008;  Diet:  regular diet;  Lifestyle:  divorced and remarried;  Exercise:  exercise is limited due to physical disability;  Occupation:  Production designer, 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  Integumentary:no rashes or new skin lesions. Respiratory: denies dyspnea, cough, wheezing or hemoptysis. Cardiovascular:  please review HPI Abdominal: denies dyspepsia, GI bleeding, constipation, or diarrhea Genitourinary-Male: no dysuria, urgency, frequency, or nocturia  Musculoskeletal:  severe arthirits Neurological:  denies headaches, stroke, or TIA Psychiatric:  denies depression or anxiety  Hematological/Immunologic:  denies any food allergies, bleeding disorders. ____________________________ PHYSICAL EXAMINATION VITAL SIGNS  Blood Pressure:  132/80 Sitting, Left arm, regular cuff  , 128/80 Standing, Left arm and regular cuff   Pulse:  88/min. Weight:  240.00 lbs. Height:  74"BMI: 31  Constitutional:  pleasant white male in no acute distress, mildly obese Skin:  warm and dry to touch, no apparent skin lesions, or masses noted. Head:  normocephalic, normal hair pattern, no masses or tenderness Eyes:  EOMS Intact, PERRLA, C and S clear, Funduscopic exam not done. ENT:  ears, nose and throat reveal no gross abnormalities.  Dentition good. Neck:  supple, without massess. No JVD, thyromegaly or carotid bruits. Carotid upstroke normal. Chest:  normal symmetry, clear to auscultation. Cardiac:  regular rhythm, normal S1 and S2, No S3 or S4, no murmurs, gallops or rubs detected. Abdomen:  abdomen soft,non-tender, no masses, no hepatospenomegaly, or aneurysm noted Peripheral Pulses:  the femoral,dorsalis pedis, and posterior tibial pulses are full and equal bilaterally with no bruits auscultated. Extremities & Back:  no deformities, clubbing, cyanosis, erythema or edema observed. Normal muscle strength and tone. Neurological:  no gross motor or sensory deficits noted, affect appropriate, oriented x3. ____________________________ IMPRESSIONS/PLAN  1. New diagnosis of cardiomyopathy of undetermined cause. He does have coronary calcification noted on his CT scan from 2012 in the LAD and circumflex and also has a mild fixed inferior defect that could either be diaphragmatic attenuation or scar. EF was 40% confirmed by echo. 2. Severe rheumatoid arthritis 3. Mild hyperlipidemia  Recommendations:  Discuss cardiomyopathy in detail with the patient and wife. We discussed potential etiologies for cardiomyopathy. I think it is important to exclude coronary artery disease as a cause. He and his  wife would like to get a catheterization taken care of next week. He's reports that he is extremely claustrophobic in the CT scan. We will request a radial catheterization from [facility] to be done hopefully next week. Cardiac catheterization was discussed with the patient including risks of myocardial infarction, death, stroke, bleeding, arrhythmia, dye allergy, or renal insufficiency. He understands and is willing to proceed.   In the meantime we'll request lab work, chest x-ray and will initiate treatment with lisinopril 10 mg daily and metoprolol succinate 25 mg daily. I asked him to discontinue the hydroxychloroquine none as this has been associated with heart failure in the past. Will also need to review the other rheumatology medicines he has been on as some have been associated with cardiomyopathy. ____________________________ TODAYS ORDERS  1. Chest X-ray PA/Lat: today  2. Comprehensive Metabolic Panel: Today  3. Complete Blood Count: Today  4. Draw PT/INR: Today  5. PTT: Today  6. Left Heart Cath: First Available                       ____________________________ Cardiology Physician:  Darden Palmer MD [facility]

## 2015-05-01 ENCOUNTER — Inpatient Hospital Stay (HOSPITAL_COMMUNITY): Admission: RE | Admit: 2015-05-01 | Payer: 59 | Source: Ambulatory Visit | Admitting: Orthopaedic Surgery

## 2015-05-01 ENCOUNTER — Ambulatory Visit (HOSPITAL_COMMUNITY)
Admission: AD | Admit: 2015-05-01 | Discharge: 2015-05-01 | Disposition: A | Discharge status: Home / Self Care | Payer: 59 | Source: Ambulatory Visit | Attending: Interventional Cardiology | Admitting: Interventional Cardiology

## 2015-05-01 ENCOUNTER — Encounter (HOSPITAL_COMMUNITY)
Admission: AD | Disposition: A | Discharge status: Home / Self Care | Payer: Self-pay | Source: Ambulatory Visit | Attending: Interventional Cardiology

## 2015-05-01 ENCOUNTER — Encounter (HOSPITAL_COMMUNITY): Admission: RE | Payer: Self-pay | Source: Ambulatory Visit

## 2015-05-01 DIAGNOSIS — I5021 Acute systolic (congestive) heart failure: Secondary | ICD-10-CM | POA: Insufficient documentation

## 2015-05-01 DIAGNOSIS — Z87891 Personal history of nicotine dependence: Secondary | ICD-10-CM | POA: Insufficient documentation

## 2015-05-01 DIAGNOSIS — Z6831 Body mass index (BMI) 31.0-31.9, adult: Secondary | ICD-10-CM | POA: Insufficient documentation

## 2015-05-01 DIAGNOSIS — I5022 Chronic systolic (congestive) heart failure: Secondary | ICD-10-CM

## 2015-05-01 DIAGNOSIS — M069 Rheumatoid arthritis, unspecified: Secondary | ICD-10-CM | POA: Diagnosis not present

## 2015-05-01 DIAGNOSIS — Z7982 Long term (current) use of aspirin: Secondary | ICD-10-CM | POA: Insufficient documentation

## 2015-05-01 DIAGNOSIS — I2510 Atherosclerotic heart disease of native coronary artery without angina pectoris: Secondary | ICD-10-CM

## 2015-05-01 DIAGNOSIS — Z7952 Long term (current) use of systemic steroids: Secondary | ICD-10-CM | POA: Diagnosis not present

## 2015-05-01 DIAGNOSIS — E669 Obesity, unspecified: Secondary | ICD-10-CM | POA: Insufficient documentation

## 2015-05-01 HISTORY — PX: CARDIAC CATHETERIZATION: SHX172

## 2015-05-01 HISTORY — DX: Chronic systolic (congestive) heart failure: I50.22

## 2015-05-01 SURGERY — ARTHROPLASTY, KNEE, TOTAL
Anesthesia: Choice | Laterality: Right

## 2015-05-01 SURGERY — LEFT HEART CATH AND CORONARY ANGIOGRAPHY
Anesthesia: LOCAL

## 2015-05-01 MED ORDER — ACETAMINOPHEN 325 MG PO TABS
650.0000 mg | ORAL_TABLET | ORAL | Status: DC | PRN
  Administered 2015-05-01: 650 mg via ORAL

## 2015-05-01 MED ORDER — VERAPAMIL HCL 2.5 MG/ML IV SOLN
INTRAVENOUS | Status: DC | PRN
  Administered 2015-05-01: 12:00:00 via INTRA_ARTERIAL

## 2015-05-01 MED ORDER — SODIUM CHLORIDE 0.9 % IJ SOLN: 3.0000 mL | INTRAMUSCULAR | Status: DC | PRN

## 2015-05-01 MED ORDER — IOHEXOL 350 MG/ML SOLN
INTRAVENOUS | Status: DC | PRN
  Administered 2015-05-01: 120 mL via INTRAVENOUS

## 2015-05-01 MED ORDER — HEPARIN SODIUM (PORCINE) 1000 UNIT/ML IJ SOLN
INTRAMUSCULAR | Status: DC | PRN
  Administered 2015-05-01: 5000 [IU] via INTRAVENOUS

## 2015-05-01 MED ORDER — ONDANSETRON HCL 4 MG/2ML IJ SOLN: 4.0000 mg | Freq: Four times a day (QID) | INTRAMUSCULAR | Status: DC | PRN

## 2015-05-01 MED ORDER — SODIUM CHLORIDE 0.9 % WEIGHT BASED INFUSION: 1.0000 mL/kg/h | INTRAVENOUS | Status: DC

## 2015-05-01 MED ORDER — SODIUM CHLORIDE 0.9 % IV SOLN: 250.0000 mL | INTRAVENOUS | Status: DC | PRN

## 2015-05-01 MED ORDER — MIDAZOLAM HCL 2 MG/2ML IJ SOLN
INTRAMUSCULAR | Status: AC
  Filled 2015-05-01: qty 2

## 2015-05-01 MED ORDER — LIDOCAINE HCL (PF) 1 % IJ SOLN
INTRAMUSCULAR | Status: AC
  Filled 2015-05-01: qty 30

## 2015-05-01 MED ORDER — ACETAMINOPHEN 325 MG PO TABS
ORAL_TABLET | ORAL | Status: AC
  Filled 2015-05-01: qty 2

## 2015-05-01 MED ORDER — HEPARIN SODIUM (PORCINE) 1000 UNIT/ML IJ SOLN
INTRAMUSCULAR | Status: AC
  Filled 2015-05-01: qty 1

## 2015-05-01 MED ORDER — VERAPAMIL HCL 2.5 MG/ML IV SOLN
INTRAVENOUS | Status: AC
  Filled 2015-05-01: qty 2

## 2015-05-01 MED ORDER — SODIUM CHLORIDE 0.9 % IJ SOLN: 3.0000 mL | Freq: Two times a day (BID) | INTRAMUSCULAR | Status: DC

## 2015-05-01 MED ORDER — SODIUM CHLORIDE 0.9 % WEIGHT BASED INFUSION
3.0000 mL/kg/h | INTRAVENOUS | Status: AC
  Administered 2015-05-01: 3 mL/kg/h via INTRAVENOUS

## 2015-05-01 MED ORDER — ASPIRIN 81 MG PO CHEW
CHEWABLE_TABLET | ORAL | Status: AC
  Administered 2015-05-01: 81 mg via ORAL
  Filled 2015-05-01: qty 1

## 2015-05-01 MED ORDER — FENTANYL CITRATE (PF) 100 MCG/2ML IJ SOLN
INTRAMUSCULAR | Status: AC
  Filled 2015-05-01: qty 2

## 2015-05-01 MED ORDER — NITROGLYCERIN 1 MG/10 ML FOR IR/CATH LAB
INTRA_ARTERIAL | Status: DC | PRN
  Administered 2015-05-01: 13:00:00

## 2015-05-01 MED ORDER — HEPARIN (PORCINE) IN NACL 2-0.9 UNIT/ML-% IJ SOLN
INTRAMUSCULAR | Status: AC
  Filled 2015-05-01: qty 1000

## 2015-05-01 MED ORDER — FENTANYL CITRATE (PF) 100 MCG/2ML IJ SOLN
INTRAMUSCULAR | Status: DC | PRN
  Administered 2015-05-01 (×2): 50 ug via INTRAVENOUS

## 2015-05-01 MED ORDER — MIDAZOLAM HCL 2 MG/2ML IJ SOLN
INTRAMUSCULAR | Status: DC | PRN
  Administered 2015-05-01 (×2): 1 mg via INTRAVENOUS

## 2015-05-01 MED ORDER — ASPIRIN 81 MG PO CHEW
81.0000 mg | CHEWABLE_TABLET | ORAL | Status: AC
  Administered 2015-05-01: 81 mg via ORAL

## 2015-05-01 SURGICAL SUPPLY — 12 items
CATH EXPO 5F MPA-1 (CATHETERS) ×2 IMPLANT
CATH INFINITI 5 FR JL3.5 (CATHETERS) ×2 IMPLANT
CATH INFINITI JR4 5F (CATHETERS) ×2 IMPLANT
CATH LAUNCHER 5F RADR (CATHETERS) ×1 IMPLANT
CATHETER LAUNCHER 5F RADR (CATHETERS) ×2
DEVICE RAD COMP TR BAND LRG (VASCULAR PRODUCTS) ×2 IMPLANT
GLIDESHEATH SLEND A-KIT 6F 22G (SHEATH) ×2 IMPLANT
KIT HEART LEFT (KITS) ×2 IMPLANT
PACK CARDIAC CATHETERIZATION (CUSTOM PROCEDURE TRAY) ×2 IMPLANT
TRANSDUCER W/STOPCOCK (MISCELLANEOUS) ×2 IMPLANT
TUBING CIL FLEX 10 FLL-RA (TUBING) ×2 IMPLANT
WIRE SAFE-T 1.5MM-J .035X260CM (WIRE) ×2 IMPLANT

## 2015-05-01 NOTE — Discharge Instructions (Signed)
Radial Site Care °Refer to this sheet in the next few weeks. These instructions provide you with information about caring for yourself after your procedure. Your health care provider may also give you more specific instructions. Your treatment has been planned according to current medical practices, but problems sometimes occur. Call your health care provider if you have any problems or questions after your procedure. °WHAT TO EXPECT AFTER THE PROCEDURE °After your procedure, it is typical to have the following: °· Bruising at the radial site that usually fades within 1-2 weeks. °· Blood collecting in the tissue (hematoma) that may be painful to the touch. It should usually decrease in size and tenderness within 1-2 weeks. °HOME CARE INSTRUCTIONS °· Take medicines only as directed by your health care provider. °· You may shower 24-48 hours after the procedure or as directed by your health care provider. Remove the bandage (dressing) and gently wash the site with plain soap and water. Pat the area dry with a clean towel. Do not rub the site, because this may cause bleeding. °· Do not take baths, swim, or use a hot tub until your health care provider approves. °· Check your insertion site every day for redness, swelling, or drainage. °· Do not apply powder or lotion to the site. °· Do not flex or bend the affected arm for 24 hours or as directed by your health care provider. °· Do not push or pull heavy objects with the affected arm for 24 hours or as directed by your health care provider. °· Do not lift over 10 lb (4.5 kg) for 5 days after your procedure or as directed by your health care provider. °· Ask your health care provider when it is okay to: °¨ Return to work or school. °¨ Resume usual physical activities or sports. °¨ Resume sexual activity. °· Do not drive home if you are discharged the same day as the procedure. Have someone else drive you. °· You may drive 24 hours after the procedure unless otherwise  instructed by your health care provider. °· Do not operate machinery or power tools for 24 hours after the procedure. °· If your procedure was done as an outpatient procedure, which means that you went home the same day as your procedure, a responsible adult should be with you for the first 24 hours after you arrive home. °· Keep all follow-up visits as directed by your health care provider. This is important. °SEEK MEDICAL CARE IF: °· You have a fever. °· You have chills °· You have increased bleeding from the radial site. Hold pressure on the site. °SEEK IMMEDIATE MEDICAL CARE IF: °· You have unusual pain at the radial site. °· You have redness, warmth, or swelling at the radial site. °· You have drainage (other than a small amount of blood on the dressing) from the radial site. °· The radial site is bleeding, and the bleeding does not stop after 30 minutes of holding steady pressure on the site. CALL 911 °· Your arm or hand becomes pale, cool, tingly, or numb. °  °This information is not intended to replace advice given to you by your health care provider. Make sure you discuss any questions you have with your health care provider. °  °Document Released: 05/24/2010 Document Revised: 05/12/2014 Document Reviewed: 11/07/2013 °Elsevier Interactive Patient Education ©2016 [employer] ° °

## 2015-05-01 NOTE — Interval H&P Note (Signed)
Cath Lab Visit (complete for each Cath Lab visit)  Clinical Evaluation Leading to the Procedure:   ACS: No.  Non-ACS:    Anginal Classification: No Symptoms  Anti-ischemic medical therapy: Minimal Therapy (1 class of medications)  Non-Invasive Test Results: No non-invasive testing performed  Prior CABG: No previous CABG      History and Physical Interval Note:  05/01/2015 12:19 PM  Christopher Yu  has presented today for surgery, with the diagnosis of abnormal EKG  The various methods of treatment have been discussed with the patient and family. After consideration of risks, benefits and other options for treatment, the patient has consented to  Procedure(s): Left Heart Cath and Coronary Angiography (N/A) as a surgical intervention .  The patient's history has been reviewed, patient examined, no change in status, stable for surgery.  I have reviewed the patient's chart and labs.  Questions were answered to the patient's satisfaction.     Lesleigh Noe

## 2015-05-01 NOTE — Research (Signed)
[place] Study Informed Consent   Subject Name: Christopher Yu  Subject met inclusion and exclusion criteria.  The informed consent form, study requirements and expectations were reviewed with the subject and questions and concerns were addressed prior to the signing of the consent form.  The subject verbalized understanding of the trial requirements.  The subject agreed to participate in the [place] trial and signed the informed consent.  The informed consent was obtained prior to performance of any protocol-specific procedures for the subject.  A copy of the signed informed consent was given to the subject and a copy was placed in the subject's medical record.  [name] 05/01/2015, 11:20 am

## 2015-05-01 NOTE — Interval H&P Note (Signed)
Cath Lab Visit (complete for each Cath Lab visit)  Clinical Evaluation Leading to the Procedure:   ACS: No.  Non-ACS:    Anginal Classification: CCS III  Anti-ischemic medical therapy: Maximal Therapy (2 or more classes of medications)  Non-Invasive Test Results: No non-invasive testing performed  Prior CABG: No previous CABG      History and Physical Interval Note:  05/01/2015 9:14 AM  Christopher Yu  has presented today for surgery, with the diagnosis of abnormal EKG  The various methods of treatment have been discussed with the patient and family. After consideration of risks, benefits and other options for treatment, the patient has consented to  Procedure(s): Left Heart Cath and Coronary Angiography (N/A) as a surgical intervention .  The patient's history has been reviewed, patient examined, no change in status, stable for surgery.  I have reviewed the patient's chart and labs.  Questions were answered to the patient's satisfaction.     Lesleigh Noe

## 2015-05-01 NOTE — H&P (View-Only) (Signed)
Patient ID: BUSH MURDOCH, male   DOB: October 27, 1960, 54 y.o.   MRN: [id number]  Raphel, Stickles  Date of visit:  04/25/2015 DOB:  02-19-1961    Age:  54 yrs. Medical record number:  [id number]     Account number:  [id number] Primary Care Provider: Thora Lance ____________________________ CURRENT DIAGNOSES  1. Abnormal electrocardiogram [ECG]  2. Encounter for preprocedural cardiovascular examination ____________________________ ALLERGIES  No Known Allergies ____________________________ MEDICATIONS  1. methotrexate sodium 2.5 mg tablet, 8 q week  2. folic acid 1 mg tablet, 1 p.o. daily  3. Plaquenil 200 mg tablet, 2 p.o. daily  4. Arava 10 mg tablet, 1 p.o. daily  5. sulfasalazine 500 mg tablet, 2 p.o. b.i.d.  6. aspirin 81 mg chewable tablet, 1 p.o. daily  7. hydrocodone 5 mg-acetaminophen 325 mg tablet, TID  8. prednisone 2.5 mg tablet, PRN ____________________________ CHIEF COMPLAINTS  Surg clearance for knee replacement ____________________________ HISTORY OF PRESENT ILLNESS This very nice 54 year old is seen for preoperative cardiac evaluation. The patient has a long-standing history of rheumatoid arthritis. He has been treated with a number of  disease modifying therapies through the years and has been under the care of multiple rheumatologists. He is in the process of switching care. He is currently on prednisone, hydroxychloroquine, Arava, sulfasalzine and methotrexate. His activity is limited because of arthritis and she is unable to exercise. He has no cardiovascular symptoms. He denies angina and has no PND, orthopnea, syncope, palpitations, or claudication. In 2012 he evidently had a CT scan done that showed some coronary calcification advanced for age particularly in the LAD at that time. He reports having had a stress test a number of years ago.  On a preoperative EKG done for upcoming knee replacement surgery next week he was found to have left axis deviation and T wave  inversions in the lateral leads. I was asked to assess him. A previous EKG from 2013 showed upright T waves in the lateral leads. He may have mild hyperlipidemia. ____________________________ PAST HISTORY  Past Medical Illnesses:  rheumatoid arthritis, obesity, denies hypertension or diabetes;  Cardiovascular Illnesses:  CAD (coronary calcification noted on CT scan 2012);  Surgical Procedures:  deviated septum repair, knee surgery, bil ankle surg;  NYHA Classification:  I;  Canadian Angina Classification:  Class 0: Asymptomatic;  Cardiology Procedures-Invasive:  no previous interventional or invasive cardiology procedures;  Cardiology Procedures-Noninvasive:  treadmill cardiolite;  LVEF not documented,   ____________________________ CARDIO-PULMONARY TEST DATES EKG Date:  04/25/2015;   ____________________________ FAMILY HISTORY Brother -- Brother alive and well Father -- Father dead Mother -- Mother alive and well Sister -- Sister alive and well Sister -- Sister alive with problem, Lupus erythematosus, Kidney disorder Sister -- Sister alive and well ____________________________ SOCIAL HISTORY Alcohol Use:  socially;  Smoking:  used to smoke but quit 2008;  Diet:  regular diet;  Lifestyle:  divorced and remarried;  Exercise:  exercise is limited due to physical disability;  Occupation:  Production designer, 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 Weight:  245.00 lbs. Height:  74"BMI: 31  Constitutional:  pleasant white male in no acute distress, mildly obese Skin:  warm and dry to touch, no apparent skin lesions, or masses noted. Head:  normocephalic, normal hair pattern, no masses or tenderness Eyes:  EOMS Intact, PERRLA, C and S clear, Funduscopic exam not done. ENT:  ears, nose and throat reveal no  gross abnormalities.  Dentition good. Neck:  supple, without massess. No JVD, thyromegaly or carotid bruits. Carotid upstroke normal. Chest:  normal symmetry, clear to auscultation. Cardiac:  regular rhythm, normal S1 and S2, No S3 or S4, no murmurs, gallops or rubs detected. Peripheral Pulses:  the femoral,dorsalis pedis, and posterior tibial pulses are full and equal bilaterally with no bruits auscultated. Extremities & Back:  no deformities, clubbing, cyanosis, erythema or edema observed. Normal muscle strength and tone. Neurological:  no gross motor or sensory deficits noted, affect appropriate, oriented x3. ____________________________ IMPRESSIONS/PLAN  1. Abnormal EKG 2. Coronary artery disease as documented by coronary calcification on CT scan several years ago 3. Obesity 4. Rheumatoid arthritis  Recommendations:  He does have coronary calcification which would indicate the presence of established coronary disease. I would recommend prior to surgery that he have a myocardial perfusion scan to be sure that he does not have high risk ischemia. At the present time he is totally asymptomatic. Thank you for asking me to see him with you.  ____________________________ TODAYS ORDERS  1. 12 Lead EKG: Today  2. Lexiscan 1 day: First Available                       ____________________________ Cardiology Physician:  Darden Palmer MD [facility]

## 2015-05-02 ENCOUNTER — Encounter (HOSPITAL_COMMUNITY): Payer: Self-pay | Admitting: Interventional Cardiology

## 2015-05-02 DIAGNOSIS — G4730 Sleep apnea, unspecified: Secondary | ICD-10-CM | POA: Insufficient documentation

## 2015-05-02 DIAGNOSIS — M069 Rheumatoid arthritis, unspecified: Secondary | ICD-10-CM | POA: Insufficient documentation

## 2015-05-02 DIAGNOSIS — I429 Cardiomyopathy, unspecified: Secondary | ICD-10-CM | POA: Insufficient documentation

## 2015-05-02 DIAGNOSIS — E785 Hyperlipidemia, unspecified: Secondary | ICD-10-CM | POA: Insufficient documentation

## 2015-05-02 NOTE — Progress Notes (Unsigned)
Patient ID: Christopher Yu, male   DOB: [birth year], 54 y.o.   MRN: [id number]   Christopher Yu  Date of visit:  04/27/2015 DOB:  11/22/1960    Age:  54 yrs. Medical record number:  [id number]     Account number:  [id number] Primary Care Provider: Thora Lance ____________________________ CURRENT DIAGNOSES  1. Cardiomyopathy, unspecified  2. Other Rheumatoid Arthritis With Rheumatoid Factor Of Multiple Sites  3. Abnormal electrocardiogram [ECG]  4. Encounter for preprocedural cardiovascular examination ____________________________ ALLERGIES  No Known Allergies ____________________________ MEDICATIONS  1. methotrexate sodium 2.5 mg tablet, 8 q week  2. folic acid 1 mg tablet, 1 p.o. daily  3. Plaquenil 200 mg tablet, 2 p.o. daily  4. Arava 10 mg tablet, 1 p.o. daily  5. sulfasalazine 500 mg tablet, 2 p.o. b.i.d.  6. aspirin 81 mg chewable tablet, 1 p.o. daily  7. hydrocodone 5 mg-acetaminophen 325 mg tablet, TID  8. prednisone 2.5 mg tablet, PRN  9. metoprolol succinate ER 25 mg tablet,extended release 24 hr, 1 p.o. daily  10. lisinopril 10 mg tablet, 1 p.o. daily ____________________________ CHIEF COMPLAINTS  Followup of Abnormal electrocardiogram [ECG] ____________________________ HISTORY OF PRESENT ILLNESS The patient returns for cardiac followup. He had a nuclear perfusion scan yesterday that showed an EF of 39% with global hypokinesis and a fixed inferior wall defect. He was originally seen for preoperative cardiac evaluation but when this was noted this was put on hold. An echocardiogram done earlier at time today confirms ejection fraction of around 40% with global hypokinesis. The echo also shows aortic regurgitation of a mild degree. The patient has an extensive history of rheumatoid arthritis and has received Rutuxin as well as a number of other immunomodulators. He is also on hydroxy chloroquine. He is not symptomatic and denies PND, orthopnea or  edema. ____________________________ PAST HISTORY  Past Medical Illnesses:  rheumatoid arthritis, obesity, denies hypertension or diabetes;  Cardiovascular Illnesses:  CAD (coronary calcification noted on CT scan 2012), cardiomyopathy(idiopathic);  Surgical Procedures:  deviated septum repair, knee surgery, bil ankle surg;  NYHA Classification:  I;  Canadian Angina Classification:  Class 0: Asymptomatic;  Cardiology Procedures-Invasive:  no previous interventional or invasive cardiology procedures;  Cardiology Procedures-Noninvasive:  treadmill cardiolite, adenosine cardiolite December 2016, echocardiogram December 2016;  LVEF of % documented via nuclear study on 04/26/2015,   ____________________________ CARDIO-PULMONARY TEST DATES EKG Date:  04/25/2015;  Nuclear Study Date:  04/26/2015;  Echocardiography Date: 04/27/2015;   ____________________________ FAMILY HISTORY Brother -- Brother alive and well Father -- Father dead Mother -- Mother alive and well Sister -- Sister alive and well Sister -- Sister alive with problem, Lupus erythematosus, Kidney disorder Sister -- Sister alive and well ____________________________ SOCIAL HISTORY Alcohol Use:  socially;  Smoking:  used to smoke but quit 2008;  Diet:  regular diet;  Lifestyle:  divorced and remarried;  Exercise:  exercise is limited due to physical disability;  Occupation:  Production designer, 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  Integumentary:no rashes or new skin lesions. Respiratory: denies dyspnea, cough, wheezing or hemoptysis. Cardiovascular:  please review HPI Abdominal: denies dyspepsia, GI bleeding, constipation, or diarrhea Genitourinary-Male: no dysuria, urgency, frequency, or nocturia  Musculoskeletal:  severe arthirits Neurological:  denies headaches, stroke, or TIA Psychiatric:  denies depression or anxiety  Hematological/Immunologic:  denies any food allergies, bleeding disorders. ____________________________ PHYSICAL EXAMINATION VITAL SIGNS  Blood Pressure:  132/80 Sitting, Left arm, regular cuff  , 128/80 Standing, Left arm and regular cuff   Pulse:  88/min. Weight:  240.00 lbs. Height:  74"BMI: 31  Constitutional:  pleasant white male in no acute distress, mildly obese Skin:  warm and dry to touch, no apparent skin lesions, or masses noted. Head:  normocephalic, normal hair pattern, no masses or tenderness Eyes:  EOMS Intact, PERRLA, C and S clear, Funduscopic exam not done. ENT:  ears, nose and throat reveal no gross abnormalities.  Dentition good. Neck:  supple, without massess. No JVD, thyromegaly or carotid bruits. Carotid upstroke normal. Chest:  normal symmetry, clear to auscultation. Cardiac:  regular rhythm, normal S1 and S2, No S3 or S4, no murmurs, gallops or rubs detected. Abdomen:  abdomen soft,non-tender, no masses, no hepatospenomegaly, or aneurysm noted Peripheral Pulses:  the femoral,dorsalis pedis, and posterior tibial pulses are full and equal bilaterally with no bruits auscultated. Extremities & Back:  no deformities, clubbing, cyanosis, erythema or edema observed. Normal muscle strength and tone. Neurological:  no gross motor or sensory deficits noted, affect appropriate, oriented x3. ____________________________ IMPRESSIONS/PLAN  1. New diagnosis of cardiomyopathy of undetermined cause. He does have coronary calcification noted on his CT scan from 2012 in the [facility] and circumflex and also has a mild fixed inferior defect that could either be diaphragmatic attenuation or scar. EF was 40% confirmed by echo. 2. Severe rheumatoid arthritis 3. Mild hyperlipidemia  Recommendations:  Discuss cardiomyopathy in detail with the patient and wife. We discussed potential etiologies for cardiomyopathy. I think it is important to exclude coronary artery disease as a cause. He and his  wife would like to get a catheterization taken care of next week. He reports that he is extremely claustrophobic in the CT scan. We will request a radial catheterization from [facility] to be done hopefully next week. Cardiac catheterization was discussed with the patient including risks of myocardial infarction, death, stroke, bleeding, arrhythmia, dye allergy, or renal insufficiency. He understands and is willing to proceed.   In the meantime we'll request lab work, chest x-ray and will initiate treatment with lisinopril 10 mg daily and metoprolol succinate 25 mg daily. I asked him to discontinue the hydroxychloroquine as this has been associated with heart failure in some cases. Will also need to review the other rheumatology medicines he has been on as some have been associated with cardiomyopathy. ____________________________ TODAYS ORDERS  1. Chest X-ray PA/Lat: today  2. Comprehensive Metabolic Panel: Today  3. Complete Blood Count: Today  4. Draw PT/INR: Today  5. PTT: Today  6. Left Heart Cath: First Available                       ____________________________ Cardiology Physician:  Darden Palmer MD [facility]

## 2015-05-24 ENCOUNTER — Other Ambulatory Visit (HOSPITAL_COMMUNITY): Payer: Self-pay | Admitting: Orthopaedic Surgery

## 2015-05-25 ENCOUNTER — Encounter (HOSPITAL_COMMUNITY): Payer: Self-pay

## 2015-05-25 ENCOUNTER — Encounter (HOSPITAL_COMMUNITY)
Admission: RE | Admit: 2015-05-25 | Discharge: 2015-05-25 | Disposition: A | Discharge status: Home / Self Care | Payer: 59 | Source: Ambulatory Visit | Attending: Orthopaedic Surgery | Admitting: Orthopaedic Surgery

## 2015-05-25 DIAGNOSIS — Z0183 Encounter for blood typing: Secondary | ICD-10-CM | POA: Diagnosis not present

## 2015-05-25 DIAGNOSIS — Z01812 Encounter for preprocedural laboratory examination: Secondary | ICD-10-CM | POA: Insufficient documentation

## 2015-05-25 DIAGNOSIS — M1711 Unilateral primary osteoarthritis, right knee: Secondary | ICD-10-CM | POA: Diagnosis not present

## 2015-05-25 HISTORY — DX: Cardiomyopathy, unspecified: I42.9

## 2015-05-25 LAB — CBC
HEMATOCRIT: 36.9 % — AB (ref 39.0–52.0)
HEMOGLOBIN: 12.2 g/dL — AB (ref 13.0–17.0)
MCH: 30.1 pg (ref 26.0–34.0)
MCHC: 33.1 g/dL (ref 30.0–36.0)
MCV: 91.1 fL (ref 78.0–100.0)
PLATELETS: 181 10*3/uL (ref 150–400)
RBC: 4.05 MIL/uL — AB (ref 4.22–5.81)
RDW: 15.5 % (ref 11.5–15.5)
WBC: 7.8 10*3/uL (ref 4.0–10.5)

## 2015-05-25 LAB — BASIC METABOLIC PANEL
Anion gap: 6 (ref 5–15)
BUN: 16 mg/dL (ref 6–20)
CALCIUM: 8.5 mg/dL — AB (ref 8.9–10.3)
CO2: 27 mmol/L (ref 22–32)
Chloride: 107 mmol/L (ref 101–111)
Creatinine, Ser: 1.3 mg/dL — ABNORMAL HIGH (ref 0.61–1.24)
GFR calc Af Amer: >60 mL/min (ref >60)
GLUCOSE: 91 mg/dL (ref 65–99)
Potassium: 4.3 mmol/L (ref 3.5–5.1)
SODIUM: 140 mmol/L (ref 135–145)

## 2015-05-25 LAB — TYPE AND SCREEN
ABO/RH(D): O POS
Antibody Screen: NEGATIVE

## 2015-05-25 LAB — SURGICAL PCR SCREEN
MRSA, PCR: NEGATIVE
STAPHYLOCOCCUS AUREUS: NEGATIVE

## 2015-05-25 LAB — ABO/RH: ABO/RH(D): O POS

## 2015-05-25 NOTE — Patient Instructions (Addendum)
YOUR PROCEDURE IS SCHEDULED ON :06/01/15  REPORT TO [facility] MAIN ENTRANCE FOLLOW SIGNS TO EAST ELEVATOR - GO TO 3rd FLOOR CHECK IN AT 3 EAST NURSES STATION (SHORT STAY) AT:  8:00 AM  CALL THIS NUMBER IF YOU HAVE PROBLEMS THE MORNING OF SURGERY [phone]  REMEMBER:ONLY 1 PER PERSON MAY GO TO SHORT STAY WITH YOU TO GET READY THE MORNING OF YOUR SURGERY  DO NOT EAT FOOD OR DRINK LIQUIDS AFTER MIDNIGHT  TAKE THESE MEDICINES THE MORNING OF SURGERY: METOPROLOL / SULFASALAZINE  YOU MAY NOT HAVE ANY METAL ON YOUR BODY INCLUDING HAIR PINS AND PIERCING'S. DO NOT WEAR JEWELRY, MAKEUP, LOTIONS, POWDERS OR PERFUMES. DO NOT WEAR NAIL POLISH. DO NOT SHAVE 48 HRS PRIOR TO SURGERY. MEN MAY SHAVE FACE AND NECK.  DO NOT BRING VALUABLES TO HOSPITAL. [facility] IS NOT RESPONSIBLE FOR VALUABLES.  CONTACTS, DENTURES OR PARTIALS MAY NOT BE WORN TO SURGERY. LEAVE SUITCASE IN CAR. CAN BE BROUGHT TO ROOM AFTER SURGERY.  PATIENTS DISCHARGED THE DAY OF SURGERY WILL NOT BE ALLOWED TO DRIVE HOME.  PLEASE READ OVER THE FOLLOWING INSTRUCTION SHEETS _________________________________________________________________________________                                          [place] - PREPARING FOR SURGERY  Before surgery, you can play an important role.  Because skin is not sterile, your skin needs to be as free of germs as possible.  You can reduce the number of germs on your skin by washing with CHG (chlorahexidine gluconate) soap before surgery.  CHG is an antiseptic cleaner which kills germs and bonds with the skin to continue killing germs even after washing. Please DO NOT use if you have an allergy to CHG or antibacterial soaps.  If your skin becomes reddened/irritated stop using the CHG and inform your nurse when you arrive at Short Stay. Do not shave (including legs and underarms) for at least 48 hours prior to the first CHG shower.  You may shave your face. Please follow these  instructions carefully:   1.  Shower with CHG Soap the night before surgery and the  morning of Surgery.   2.  If you choose to wash your hair, wash your hair first as usual with your  normal  Shampoo.   3.  After you shampoo, rinse your hair and body thoroughly to remove the  shampoo.                                         4.  Use CHG as you would any other liquid soap.  You can apply chg directly  to the skin and wash . Gently wash with scrungie or clean wascloth    5.  Apply the CHG Soap to your body ONLY FROM THE NECK DOWN.   Do not use on open                           Wound or open sores. Avoid contact with eyes, ears mouth and genitals (private parts).                        Genitals (private parts) with your normal soap.  6.  Wash thoroughly, paying special attention to the area where your surgery  will be performed.   7.  Thoroughly rinse your body with warm water from the neck down.   8.  DO NOT shower/wash with your normal soap after using and rinsing off  the CHG Soap .                9.  Pat yourself dry with a clean towel.             10.  Wear clean night clothes to bed after shower             11.  Place clean sheets on your bed the night of your first shower and do not  sleep with pets.  Day of Surgery : Do not apply any lotions/deodorants the morning of surgery.  Please wear clean clothes to the hospital/surgery center.  FAILURE TO FOLLOW THESE INSTRUCTIONS MAY RESULT IN THE CANCELLATION OF YOUR SURGERY    PATIENT SIGNATURE_________________________________  ______________________________________________________________________

## 2015-06-01 ENCOUNTER — Inpatient Hospital Stay (HOSPITAL_COMMUNITY)
Admission: RE | Admit: 2015-06-01 | Discharge: 2015-06-04 | DRG: 470 | Disposition: A | Discharge status: Home Health | Payer: 59 | Source: Ambulatory Visit | Attending: Orthopaedic Surgery | Admitting: Orthopaedic Surgery

## 2015-06-01 ENCOUNTER — Inpatient Hospital Stay (HOSPITAL_COMMUNITY): Payer: 59

## 2015-06-01 ENCOUNTER — Encounter (HOSPITAL_COMMUNITY)
Admission: RE | Disposition: A | Discharge status: Home Health | Payer: Self-pay | Source: Ambulatory Visit | Attending: Orthopaedic Surgery

## 2015-06-01 ENCOUNTER — Encounter (HOSPITAL_COMMUNITY): Payer: Self-pay | Admitting: *Deleted

## 2015-06-01 ENCOUNTER — Inpatient Hospital Stay (HOSPITAL_COMMUNITY): Payer: 59 | Admitting: Anesthesiology

## 2015-06-01 DIAGNOSIS — I2510 Atherosclerotic heart disease of native coronary artery without angina pectoris: Secondary | ICD-10-CM | POA: Diagnosis present

## 2015-06-01 DIAGNOSIS — I429 Cardiomyopathy, unspecified: Secondary | ICD-10-CM | POA: Diagnosis present

## 2015-06-01 DIAGNOSIS — E669 Obesity, unspecified: Secondary | ICD-10-CM | POA: Diagnosis present

## 2015-06-01 DIAGNOSIS — Z87891 Personal history of nicotine dependence: Secondary | ICD-10-CM

## 2015-06-01 DIAGNOSIS — Z01812 Encounter for preprocedural laboratory examination: Secondary | ICD-10-CM

## 2015-06-01 DIAGNOSIS — G4730 Sleep apnea, unspecified: Secondary | ICD-10-CM | POA: Diagnosis present

## 2015-06-01 DIAGNOSIS — M06861 Other specified rheumatoid arthritis, right knee: Principal | ICD-10-CM | POA: Diagnosis present

## 2015-06-01 DIAGNOSIS — I5022 Chronic systolic (congestive) heart failure: Secondary | ICD-10-CM | POA: Diagnosis present

## 2015-06-01 DIAGNOSIS — Z6830 Body mass index (BMI) 30.0-30.9, adult: Secondary | ICD-10-CM | POA: Diagnosis not present

## 2015-06-01 DIAGNOSIS — Z96651 Presence of right artificial knee joint: Secondary | ICD-10-CM

## 2015-06-01 DIAGNOSIS — M069 Rheumatoid arthritis, unspecified: Secondary | ICD-10-CM

## 2015-06-01 DIAGNOSIS — E785 Hyperlipidemia, unspecified: Secondary | ICD-10-CM | POA: Diagnosis present

## 2015-06-01 DIAGNOSIS — M25561 Pain in right knee: Secondary | ICD-10-CM | POA: Diagnosis present

## 2015-06-01 HISTORY — DX: Presence of right artificial knee joint: Z96.651

## 2015-06-01 HISTORY — PX: TOTAL KNEE ARTHROPLASTY: SHX125

## 2015-06-01 SURGERY — ARTHROPLASTY, KNEE, TOTAL
Anesthesia: General | Site: Knee | Laterality: Right

## 2015-06-01 MED ORDER — DEXAMETHASONE SODIUM PHOSPHATE 10 MG/ML IJ SOLN
INTRAMUSCULAR | Status: DC | PRN
  Administered 2015-06-01: 10 mg via INTRAVENOUS

## 2015-06-01 MED ORDER — ESMOLOL HCL 100 MG/10ML IV SOLN
INTRAVENOUS | Status: DC | PRN
  Administered 2015-06-01: 30 ug via INTRAVENOUS

## 2015-06-01 MED ORDER — METOCLOPRAMIDE HCL 5 MG/ML IJ SOLN: 5.0000 mg | Freq: Three times a day (TID) | INTRAMUSCULAR | Status: DC | PRN

## 2015-06-01 MED ORDER — MIDAZOLAM HCL 2 MG/2ML IJ SOLN
INTRAMUSCULAR | Status: AC
  Filled 2015-06-01: qty 2

## 2015-06-01 MED ORDER — BUPIVACAINE-EPINEPHRINE (PF) 0.5% -1:200000 IJ SOLN
INTRAMUSCULAR | Status: AC
  Filled 2015-06-01: qty 30

## 2015-06-01 MED ORDER — ZOLPIDEM TARTRATE 5 MG PO TABS
5.0000 mg | ORAL_TABLET | Freq: Every evening | ORAL | Status: DC | PRN
  Administered 2015-06-02 (×2): 5 mg via ORAL
  Filled 2015-06-01 (×2): qty 1

## 2015-06-01 MED ORDER — ONDANSETRON HCL 4 MG/2ML IJ SOLN: 4.0000 mg | Freq: Four times a day (QID) | INTRAMUSCULAR | Status: DC | PRN

## 2015-06-01 MED ORDER — LACTATED RINGERS IV SOLN: INTRAVENOUS | Status: DC

## 2015-06-01 MED ORDER — PROPOFOL 10 MG/ML IV BOLUS
INTRAVENOUS | Status: AC
  Filled 2015-06-01: qty 20

## 2015-06-01 MED ORDER — SUGAMMADEX SODIUM 500 MG/5ML IV SOLN
INTRAVENOUS | Status: DC | PRN
  Administered 2015-06-01: 200 mg via INTRAVENOUS

## 2015-06-01 MED ORDER — STERILE WATER FOR IRRIGATION IR SOLN
Status: DC | PRN
  Administered 2015-06-01 (×2): 1000 mL

## 2015-06-01 MED ORDER — ACETAMINOPHEN 650 MG RE SUPP: 650.0000 mg | Freq: Four times a day (QID) | RECTAL | Status: DC | PRN

## 2015-06-01 MED ORDER — CEFAZOLIN SODIUM-DEXTROSE 2-3 GM-% IV SOLR
INTRAVENOUS | Status: AC
  Filled 2015-06-01: qty 50

## 2015-06-01 MED ORDER — OXYCODONE HCL 5 MG PO TABS
5.0000 mg | ORAL_TABLET | ORAL | Status: DC | PRN
  Administered 2015-06-01 – 2015-06-04 (×16): 10 mg via ORAL
  Filled 2015-06-01 (×16): qty 2

## 2015-06-01 MED ORDER — FOLIC ACID 1 MG PO TABS
1.0000 mg | ORAL_TABLET | Freq: Every day | ORAL | Status: DC
  Administered 2015-06-01 – 2015-06-04 (×4): 1 mg via ORAL
  Filled 2015-06-01 (×4): qty 1

## 2015-06-01 MED ORDER — POLYETHYLENE GLYCOL 3350 17 G PO PACK
17.0000 g | PACK | Freq: Every day | ORAL | Status: DC | PRN
  Administered 2015-06-04: 17 g via ORAL
  Filled 2015-06-01: qty 1

## 2015-06-01 MED ORDER — SUCCINYLCHOLINE CHLORIDE 20 MG/ML IJ SOLN
INTRAMUSCULAR | Status: DC | PRN
Start: 2015-06-01 — End: 2015-06-01
  Administered 2015-06-01: 100 mg via INTRAVENOUS

## 2015-06-01 MED ORDER — HYDROMORPHONE HCL 1 MG/ML IJ SOLN
INTRAMUSCULAR | Status: DC | PRN
  Administered 2015-06-01 (×2): 1 mg via INTRAVENOUS

## 2015-06-01 MED ORDER — RIVAROXABAN 10 MG PO TABS
10.0000 mg | ORAL_TABLET | Freq: Every day | ORAL | Status: DC
  Administered 2015-06-02 – 2015-06-04 (×3): 10 mg via ORAL
  Filled 2015-06-01 (×4): qty 1

## 2015-06-01 MED ORDER — ONDANSETRON HCL 4 MG/2ML IJ SOLN: 4.0000 mg | Freq: Once | INTRAMUSCULAR | Status: DC | PRN

## 2015-06-01 MED ORDER — SULFASALAZINE 500 MG PO TABS
1000.0000 mg | ORAL_TABLET | Freq: Two times a day (BID) | ORAL | Status: DC
  Administered 2015-06-01 – 2015-06-04 (×6): 1000 mg via ORAL
  Filled 2015-06-01 (×7): qty 2

## 2015-06-01 MED ORDER — METOCLOPRAMIDE HCL 10 MG PO TABS: 5.0000 mg | ORAL_TABLET | Freq: Three times a day (TID) | ORAL | Status: DC | PRN

## 2015-06-01 MED ORDER — ONDANSETRON HCL 4 MG PO TABS: 4.0000 mg | ORAL_TABLET | Freq: Four times a day (QID) | ORAL | Status: DC | PRN

## 2015-06-01 MED ORDER — ESMOLOL HCL 100 MG/10ML IV SOLN
INTRAVENOUS | Status: AC
  Filled 2015-06-01: qty 10

## 2015-06-01 MED ORDER — HYDROMORPHONE HCL 2 MG/ML IJ SOLN
INTRAMUSCULAR | Status: AC
  Filled 2015-06-01: qty 1

## 2015-06-01 MED ORDER — MENTHOL 3 MG MT LOZG: 1.0000 | LOZENGE | OROMUCOSAL | Status: DC | PRN

## 2015-06-01 MED ORDER — METOPROLOL SUCCINATE ER 25 MG PO TB24
25.0000 mg | ORAL_TABLET | Freq: Every day | ORAL | Status: DC
  Administered 2015-06-02 – 2015-06-04 (×3): 25 mg via ORAL
  Filled 2015-06-01 (×3): qty 1

## 2015-06-01 MED ORDER — MIDAZOLAM HCL 5 MG/5ML IJ SOLN
INTRAMUSCULAR | Status: DC | PRN
  Administered 2015-06-01: 2 mg via INTRAVENOUS

## 2015-06-01 MED ORDER — CEFAZOLIN SODIUM-DEXTROSE 2-3 GM-% IV SOLR
2.0000 g | INTRAVENOUS | Status: AC
  Administered 2015-06-01: 2 g via INTRAVENOUS

## 2015-06-01 MED ORDER — LIDOCAINE HCL (CARDIAC) 20 MG/ML IV SOLN
INTRAVENOUS | Status: DC | PRN
Start: 2015-06-01 — End: 2015-06-01
  Administered 2015-06-01: 50 mg via INTRAVENOUS

## 2015-06-01 MED ORDER — FENTANYL CITRATE (PF) 100 MCG/2ML IJ SOLN
INTRAMUSCULAR | Status: DC | PRN
  Administered 2015-06-01 (×2): 100 ug via INTRAVENOUS

## 2015-06-01 MED ORDER — FENTANYL CITRATE (PF) 100 MCG/2ML IJ SOLN
25.0000 ug | INTRAMUSCULAR | Status: DC | PRN
  Administered 2015-06-01 (×2): 50 ug via INTRAVENOUS

## 2015-06-01 MED ORDER — METOPROLOL SUCCINATE ER 25 MG PO TB24
25.0000 mg | ORAL_TABLET | Freq: Every day | ORAL | Status: DC
  Administered 2015-06-01: 25 mg via ORAL
  Filled 2015-06-01: qty 1

## 2015-06-01 MED ORDER — SODIUM CHLORIDE 0.9 % IR SOLN
Status: DC | PRN
  Administered 2015-06-01 (×2): 1000 mL

## 2015-06-01 MED ORDER — FENTANYL CITRATE (PF) 100 MCG/2ML IJ SOLN
INTRAMUSCULAR | Status: AC
  Filled 2015-06-01: qty 2

## 2015-06-01 MED ORDER — PROPOFOL 10 MG/ML IV BOLUS
INTRAVENOUS | Status: DC | PRN
  Administered 2015-06-01: 200 mg via INTRAVENOUS

## 2015-06-01 MED ORDER — ROCURONIUM BROMIDE 100 MG/10ML IV SOLN
INTRAVENOUS | Status: DC | PRN
  Administered 2015-06-01: 35 mg via INTRAVENOUS
  Administered 2015-06-01: 5 mg via INTRAVENOUS
  Administered 2015-06-01: 10 mg via INTRAVENOUS

## 2015-06-01 MED ORDER — HYDROMORPHONE HCL 1 MG/ML IJ SOLN
INTRAMUSCULAR | Status: AC
  Filled 2015-06-01: qty 2

## 2015-06-01 MED ORDER — DEXAMETHASONE SODIUM PHOSPHATE 10 MG/ML IJ SOLN
INTRAMUSCULAR | Status: AC
  Filled 2015-06-01: qty 1

## 2015-06-01 MED ORDER — ONDANSETRON HCL 4 MG/2ML IJ SOLN
INTRAMUSCULAR | Status: AC
  Filled 2015-06-01: qty 2

## 2015-06-01 MED ORDER — SUGAMMADEX SODIUM 200 MG/2ML IV SOLN
INTRAVENOUS | Status: AC
  Filled 2015-06-01: qty 2

## 2015-06-01 MED ORDER — PHENOL 1.4 % MT LIQD: 1.0000 | OROMUCOSAL | Status: DC | PRN

## 2015-06-01 MED ORDER — ONDANSETRON HCL 4 MG/2ML IJ SOLN
INTRAMUSCULAR | Status: DC | PRN
  Administered 2015-06-01: 4 mg via INTRAVENOUS

## 2015-06-01 MED ORDER — 0.9 % SODIUM CHLORIDE (POUR BTL) OPTIME
TOPICAL | Status: DC | PRN
  Administered 2015-06-01: 1000 mL

## 2015-06-01 MED ORDER — HYDROMORPHONE HCL 1 MG/ML IJ SOLN
0.2500 mg | INTRAMUSCULAR | Status: DC | PRN
  Administered 2015-06-01 (×7): 0.25 mg via INTRAVENOUS

## 2015-06-01 MED ORDER — ACETAMINOPHEN 325 MG PO TABS: 650.0000 mg | ORAL_TABLET | Freq: Four times a day (QID) | ORAL | Status: DC | PRN

## 2015-06-01 MED ORDER — LEFLUNOMIDE 20 MG PO TABS
20.0000 mg | ORAL_TABLET | Freq: Every day | ORAL | Status: DC
  Administered 2015-06-01 – 2015-06-04 (×4): 20 mg via ORAL
  Filled 2015-06-01 (×5): qty 1

## 2015-06-01 MED ORDER — METHOCARBAMOL 1000 MG/10ML IJ SOLN
500.0000 mg | Freq: Four times a day (QID) | INTRAVENOUS | Status: DC | PRN
  Administered 2015-06-01: 500 mg via INTRAVENOUS
  Filled 2015-06-01 (×2): qty 5

## 2015-06-01 MED ORDER — METHOCARBAMOL 500 MG PO TABS
500.0000 mg | ORAL_TABLET | Freq: Four times a day (QID) | ORAL | Status: DC | PRN
  Administered 2015-06-01 – 2015-06-04 (×6): 500 mg via ORAL
  Filled 2015-06-01 (×6): qty 1

## 2015-06-01 MED ORDER — LIDOCAINE HCL (CARDIAC) 20 MG/ML IV SOLN
INTRAVENOUS | Status: AC
  Filled 2015-06-01: qty 5

## 2015-06-01 MED ORDER — ALUM & MAG HYDROXIDE-SIMETH 200-200-20 MG/5ML PO SUSP: 30.0000 mL | ORAL | Status: DC | PRN

## 2015-06-01 MED ORDER — CEFAZOLIN SODIUM 1-5 GM-% IV SOLN
1.0000 g | Freq: Four times a day (QID) | INTRAVENOUS | Status: AC
  Administered 2015-06-01 (×2): 1 g via INTRAVENOUS
  Filled 2015-06-01 (×2): qty 50

## 2015-06-01 MED ORDER — BUPIVACAINE-EPINEPHRINE (PF) 0.5% -1:200000 IJ SOLN
INTRAMUSCULAR | Status: DC | PRN
  Administered 2015-06-01: 25 mL

## 2015-06-01 MED ORDER — DOCUSATE SODIUM 100 MG PO CAPS
100.0000 mg | ORAL_CAPSULE | Freq: Two times a day (BID) | ORAL | Status: DC
  Administered 2015-06-01 – 2015-06-04 (×6): 100 mg via ORAL
  Filled 2015-06-01 (×2): qty 1

## 2015-06-01 MED ORDER — HYDROMORPHONE HCL 1 MG/ML IJ SOLN
1.0000 mg | INTRAMUSCULAR | Status: DC | PRN
Start: 2015-06-01 — End: 2015-06-04
  Administered 2015-06-01 – 2015-06-03 (×11): 1 mg via INTRAVENOUS
  Filled 2015-06-01 (×11): qty 1

## 2015-06-01 MED ORDER — LACTATED RINGERS IV SOLN
INTRAVENOUS | Status: DC | PRN
  Administered 2015-06-01 (×2): via INTRAVENOUS

## 2015-06-01 MED ORDER — SODIUM CHLORIDE 0.9 % IV SOLN
INTRAVENOUS | Status: DC
  Administered 2015-06-01: 20:00:00 via INTRAVENOUS

## 2015-06-01 MED ORDER — DIPHENHYDRAMINE HCL 12.5 MG/5ML PO ELIX: 12.5000 mg | ORAL_SOLUTION | ORAL | Status: DC | PRN

## 2015-06-01 SURGICAL SUPPLY — 57 items
BAG ZIPLOCK 12X15 (MISCELLANEOUS) ×2 IMPLANT
BANDAGE ACE 6X5 VEL STRL LF (GAUZE/BANDAGES/DRESSINGS) ×2 IMPLANT
BENZOIN TINCTURE PRP APPL 2/3 (GAUZE/BANDAGES/DRESSINGS) ×2 IMPLANT
BLADE SAG 13.0X1.37X90 (BLADE) IMPLANT
BLADE SAG 18X100X1.27 (BLADE) ×2 IMPLANT
BNDG COHESIVE 6X5 TAN STRL LF (GAUZE/BANDAGES/DRESSINGS) ×2 IMPLANT
BOWL SMART MIX CTS (DISPOSABLE) ×2 IMPLANT
CAPT KNEE TOTAL 3 ×2 IMPLANT
CEMENT BONE 1-PACK (Cement) ×4 IMPLANT
CLOTH BEACON ORANGE TIMEOUT ST (SAFETY) ×2 IMPLANT
COVER BACK TABLE 60X90IN (DRAPES) ×2 IMPLANT
CUFF TOURN SGL QUICK 34 (TOURNIQUET CUFF) ×1
CUFF TRNQT CYL 34X4X40X1 (TOURNIQUET CUFF) ×1 IMPLANT
DRAPE EXTREMITY T 121X128X90 (DRAPE) ×2 IMPLANT
DRAPE POUCH INSTRU U-SHP 10X18 (DRAPES) ×2 IMPLANT
DRAPE U-SHAPE 47X51 STRL (DRAPES) ×2 IMPLANT
DRSG AQUACEL AG ADV 3.5X10 (GAUZE/BANDAGES/DRESSINGS) IMPLANT
DRSG PAD ABDOMINAL 8X10 ST (GAUZE/BANDAGES/DRESSINGS) IMPLANT
DURAPREP 26ML APPLICATOR (WOUND CARE) ×2 IMPLANT
ELECT PENCIL ROCKER SW 15FT (MISCELLANEOUS) ×2 IMPLANT
ELECT REM PT RETURN 9FT ADLT (ELECTROSURGICAL) ×2
ELECTRODE REM PT RTRN 9FT ADLT (ELECTROSURGICAL) ×1 IMPLANT
GAUZE SPONGE 4X4 12PLY STRL (GAUZE/BANDAGES/DRESSINGS) ×2 IMPLANT
GAUZE XEROFORM 1X8 LF (GAUZE/BANDAGES/DRESSINGS) IMPLANT
GLOVE BIO SURGEON STRL SZ7.5 (GLOVE) ×2 IMPLANT
GLOVE BIOGEL PI IND STRL 8 (GLOVE) ×2 IMPLANT
GLOVE BIOGEL PI INDICATOR 8 (GLOVE) ×2
GLOVE ECLIPSE 8.0 STRL XLNG CF (GLOVE) ×2 IMPLANT
GOWN STRL REUS W/TWL XL LVL3 (GOWN DISPOSABLE) ×4 IMPLANT
HANDPIECE INTERPULSE COAX TIP (DISPOSABLE) ×1
IMMOBILIZER KNEE 20 (SOFTGOODS) ×2
IMMOBILIZER KNEE 20 THIGH 36 (SOFTGOODS) ×1 IMPLANT
NS IRRIG 1000ML POUR BTL (IV SOLUTION) ×2 IMPLANT
PACK ANTERIOR HIP CUSTOM (KITS) ×2 IMPLANT
PACK TOTAL KNEE CUSTOM (KITS) IMPLANT
PAD ABD 8X10 STRL (GAUZE/BANDAGES/DRESSINGS) ×4 IMPLANT
PADDING CAST COTTON 6X4 STRL (CAST SUPPLIES) ×4 IMPLANT
POSITIONER SURGICAL ARM (MISCELLANEOUS) IMPLANT
SET HNDPC FAN SPRY TIP SCT (DISPOSABLE) ×1 IMPLANT
SET PAD KNEE POSITIONER (MISCELLANEOUS) ×2 IMPLANT
STAPLER VISISTAT 35W (STAPLE) IMPLANT
STOCKINETTE 6  STRL (DRAPES) ×1
STOCKINETTE 6 STRL (DRAPES) ×1 IMPLANT
STRIP CLOSURE SKIN 1/2X4 (GAUZE/BANDAGES/DRESSINGS) ×4 IMPLANT
SUCTION FRAZIER 12FR DISP (SUCTIONS) ×2 IMPLANT
SUT MNCRL AB 4-0 PS2 18 (SUTURE) ×2 IMPLANT
SUT VIC AB 0 CT1 27 (SUTURE) ×1
SUT VIC AB 0 CT1 27XBRD ANTBC (SUTURE) ×1 IMPLANT
SUT VIC AB 1 CT1 27 (SUTURE) ×2
SUT VIC AB 1 CT1 27XBRD ANTBC (SUTURE) ×2 IMPLANT
SUT VIC AB 2-0 CT1 27 (SUTURE) ×2
SUT VIC AB 2-0 CT1 TAPERPNT 27 (SUTURE) ×2 IMPLANT
TRAY FOLEY W/METER SILVER 14FR (SET/KITS/TRAYS/PACK) IMPLANT
TRAY FOLEY W/METER SILVER 16FR (SET/KITS/TRAYS/PACK) IMPLANT
WATER STERILE IRR 1500ML POUR (IV SOLUTION) IMPLANT
WRAP KNEE MAXI GEL POST OP (GAUZE/BANDAGES/DRESSINGS) ×2 IMPLANT
YANKAUER SUCT BULB TIP 10FT TU (MISCELLANEOUS) IMPLANT

## 2015-06-01 NOTE — Anesthesia Postprocedure Evaluation (Signed)
Anesthesia Post Note  Patient: Christopher Yu  Procedure(s) Performed: Procedure(s) (LRB): RIGHT TOTAL KNEE ARTHROPLASTY (Right)  Patient location during evaluation: PACU Anesthesia Type: General Level of consciousness: awake and alert Pain management: pain level controlled Vital Signs Assessment: post-procedure vital signs reviewed and stable Respiratory status: spontaneous breathing, nonlabored ventilation, respiratory function stable and patient connected to nasal cannula oxygen Cardiovascular status: blood pressure returned to baseline and stable Postop Assessment: no signs of nausea or vomiting Anesthetic complications: no    Last Vitals:  Filed Vitals:   06/01/15 1255 06/01/15 1300  BP:  112/81  Pulse: 80 80  Temp:    Resp: 11 11    Last Pain:  Filed Vitals:   06/01/15 1310  PainSc: 6                  Jackalynn Art JENNETTE

## 2015-06-01 NOTE — H&P (Signed)
TOTAL KNEE ADMISSION H&P  Patient is being admitted for right total knee arthroplasty.  Subjective:  Chief Complaint:right knee pain.  HPI: Christopher Yu, 55 y.o. male, has a history of pain and functional disability in the right knee due to arthritis and has failed non-surgical conservative treatments for greater than 12 weeks to includeNSAID's and/or analgesics, corticosteriod injections, viscosupplementation injections, flexibility and strengthening excercises and activity modification.  Onset of symptoms was gradual, starting 5 years ago with gradually worsening course since that time. The patient noted prior procedures on the knee to include  arthroscopy on the right knee(s).  Patient currently rates pain in the right knee(s) at 10 out of 10 with activity. Patient has night pain, worsening of pain with activity and weight bearing, pain that interferes with activities of daily living and pain with passive range of motion.  Patient has evidence of periarticular osteophytes and joint space narrowing by imaging studies. There is no active infection.  Patient Active Problem List   Diagnosis Date Noted  . Rheumatoid arthritis involving right knee (HCC) 06/01/2015  . Cardiomyopathy (HCC) 05/02/2015  . Hyperlipidemia   . Sleep apnea in adult   . Rheumatoid arthritis (HCC)   . Chronic systolic heart failure (HCC) 05/01/2015  . CAD (coronary artery disease), native coronary artery    Past Medical History  Diagnosis Date  . Rheumatoid aortitis (HCC)   . Arthritis, rheumatoid (HCC)     dx 1988  . Cardiomyopathy (HCC)   . Sleep apnea     does not use cpap - UNABLE TO TOLERATE MASK    Past Surgical History  Procedure Laterality Date  . Knee arthroscopy      right x 4  . Fracture surgery      left femur fracture x 3  . Tonsillectomy    . Nasal septoplasty w/ turbinoplasty  06/25/2011    Procedure: NASAL SEPTOPLASTY WITH TURBINATE REDUCTION;  Surgeon: Osborn Coho, MD;  Location: [facility]  OR;  Service: ENT;  Laterality: Bilateral;  . Ankle fusion Right 2014    related to his arthritis  . Ankle fusion Left 05/25/13    related to his arthritis  . Cardiac catheterization N/A 05/01/2015    Procedure: Left Heart Cath and Coronary Angiography;  Surgeon: Lyn Records, MD;  Location: [facility] INVASIVE CV LAB;  Service: Cardiovascular;  Laterality: N/A;    No prescriptions prior to admission   No Known Allergies  Social History  Substance Use Topics  . Smoking status: Former Smoker -- 0.50 packs/day for 20 years    Quit date: 05/24/2008  . Smokeless tobacco: Not on file     Comment: stopped smoking 8 yrs ago.  . Alcohol Use: Yes     Comment: occaision monthly    No family history on file.   Review of Systems  Musculoskeletal: Positive for joint pain.  All other systems reviewed and are negative.   Objective:  Physical Exam  Constitutional: He is oriented to person, place, and time. He appears well-developed and well-nourished.  HENT:  Head: Normocephalic and atraumatic.  Eyes: EOM are normal. Pupils are equal, round, and reactive to light.  Neck: Normal range of motion. Neck supple.  Cardiovascular: Normal rate and regular rhythm.   Respiratory: Effort normal and breath sounds normal.  GI: Soft. Bowel sounds are normal.  Musculoskeletal:       Right knee: He exhibits decreased range of motion and swelling. Tenderness found. Medial joint line and lateral joint line tenderness noted.  Neurological: He is alert and oriented to person, place, and time.  Skin: Skin is warm and dry.  Psychiatric: He has a normal mood and affect.    Vital signs in last 24 hours:    Labs:   Estimated body mass index is 30.80 kg/(m^2) as calculated from the following:   Height as of 05/01/15: 6\' 2"  (1.88 m).   Weight as of 05/01/15: 108.863 kg (240 lb).   Imaging Review Plain radiographs demonstrate moderate degenerative joint disease of the right knee(s). The overall alignment  isneutral. The bone quality appears to be good for age and reported activity level.  Assessment/Plan:  End stage arthritis, right knee   The patient history, physical examination, clinical judgment of the provider and imaging studies are consistent with end stage degenerative joint disease of the right knee(s) and total knee arthroplasty is deemed medically necessary. The treatment options including medical management, injection therapy arthroscopy and arthroplasty were discussed at length. The risks and benefits of total knee arthroplasty were presented and reviewed. The risks due to aseptic loosening, infection, stiffness, patella tracking problems, thromboembolic complications and other imponderables were discussed. The patient acknowledged the explanation, agreed to proceed with the plan and consent was signed. Patient is being admitted for inpatient treatment for surgery, pain control, PT, OT, prophylactic antibiotics, VTE prophylaxis, progressive ambulation and ADL's and discharge planning. The patient is planning to be discharged home with home health services

## 2015-06-01 NOTE — Discharge Instructions (Addendum)
Information on my medicine - XARELTO® (Rivaroxaban) ° °This medication education was reviewed with me or my healthcare representative as part of my discharge preparation.  The pharmacist that spoke with me during my hospital stay was:  Christopher Yu, Christopher Yu, RPH ° °Why was Xarelto® prescribed for you? °Xarelto® was prescribed for you to reduce the risk of blood clots forming after orthopedic surgery. The medical term for these abnormal blood clots is venous thromboembolism (VTE). ° °What do you need to know about xarelto® ? °Take your Xarelto® ONCE DAILY at the same time every day. °You may take it either with or without food. ° °If you have difficulty swallowing the tablet whole, you may crush it and mix in applesauce just prior to taking your dose. ° °Take Xarelto® exactly as prescribed by your doctor and DO NOT stop taking Xarelto® without talking to the doctor who prescribed the medication.  Stopping without other VTE prevention medication to take the place of Xarelto® may increase your risk of developing a clot. ° °After discharge, you should have regular check-up appointments with your healthcare provider that is prescribing your Xarelto®.   ° °What do you do if you miss a dose? °If you miss a dose, take it as soon as you remember on the same day then continue your regularly scheduled once daily regimen the next day. Do not take two doses of Xarelto® on the same day.  ° °Important Safety Information °A possible side effect of Xarelto® is bleeding. You should call your healthcare provider right away if you experience any of the following: °? Bleeding from an injury or your nose that does not stop. °? Unusual colored urine (red or dark brown) or unusual colored stools (red or black). °? Unusual bruising for unknown reasons. °? A serious fall or if you hit your head (even if there is no bleeding). ° °Some medicines may interact with Xarelto® and might increase your risk of bleeding while on Xarelto®. To help avoid  this, consult your healthcare provider or pharmacist prior to using any new prescription or non-prescription medications, including herbals, vitamins, non-steroidal anti-inflammatory drugs (NSAIDs) and supplements. ° °This website has more information on Xarelto®: www.xarelto.com. ° °INSTRUCTIONS AFTER JOINT REPLACEMENT  ° °o Remove items at home which could result in a fall. This includes throw rugs or furniture in walking pathways °o ICE to the affected joint every three hours while awake for 30 minutes at a time, for at least the first 3-5 days, and then as needed for pain and swelling.  Continue to use ice for pain and swelling. You may notice swelling that will progress down to the foot and ankle.  This is normal after surgery.  Elevate your leg when you are not up walking on it.   °o Continue to use the breathing machine you got in the hospital (incentive spirometer) which will help keep your temperature down.  It is common for your temperature to cycle up and down following surgery, especially at night when you are not up moving around and exerting yourself.  The breathing machine keeps your lungs expanded and your temperature down. ° ° °DIET:  As you were doing prior to hospitalization, we recommend a well-balanced diet. ° °DRESSING / WOUND CARE / SHOWERING ° °Keep the surgical dressing until follow up.  The dressing is water proof, so you can shower without any extra covering.  IF THE DRESSING FALLS OFF or the wound gets wet inside, change the dressing with sterile gauze.    Please use good hand washing techniques before changing the dressing.  Do not use any lotions or creams on the incision until instructed by your surgeon.   ° °ACTIVITY ° °o Increase activity slowly as tolerated, but follow the weight bearing instructions below.   °o No driving for 6 weeks or until further direction given by your physician.  You cannot drive while taking narcotics.  °o No lifting or carrying greater than 10 lbs. until  further directed by your surgeon. °o Avoid periods of inactivity such as sitting longer than an hour when not asleep. This helps prevent blood clots.  °o You may return to work once you are authorized by your doctor.  ° ° ° °WEIGHT BEARING  ° °Weight bearing as tolerated with assist device (walker, cane, etc) as directed, use it as long as suggested by your surgeon or therapist, typically at least 4-6 weeks. ° ° °EXERCISES ° °Results after joint replacement surgery are often greatly improved when you follow the exercise, range of motion and muscle strengthening exercises prescribed by your doctor. Safety measures are also important to protect the joint from further injury. Any time any of these exercises cause you to have increased pain or swelling, decrease what you are doing until you are comfortable again and then slowly increase them. If you have problems or questions, call your caregiver or physical therapist for advice.  ° °Rehabilitation is important following a joint replacement. After just a few days of immobilization, the muscles of the leg can become weakened and shrink (atrophy).  These exercises are designed to build up the tone and strength of the thigh and leg muscles and to improve motion. Often times heat used for twenty to thirty minutes before working out will loosen up your tissues and help with improving the range of motion but do not use heat for the first two weeks following surgery (sometimes heat can increase post-operative swelling).  ° °These exercises can be done on a training (exercise) mat, on the floor, on a table or on a bed. Use whatever works the best and is most comfortable for you.    Use music or television while you are exercising so that the exercises are a pleasant break in your day. This will make your life better with the exercises acting as a break in your routine that you can look forward to.   Perform all exercises about fifteen times, three times per day or as directed.   You should exercise both the operative leg and the other leg as well. ° °Exercises include: °  °• Quad Sets - Tighten up the muscle on the front of the thigh (Quad) and hold for 5-10 seconds.   °• Straight Leg Raises - With your knee straight (if you were given a brace, keep it on), lift the leg to 60 degrees, hold for 3 seconds, and slowly lower the leg.  Perform this exercise against resistance later as your leg gets stronger.  °• Leg Slides: Lying on your back, slowly slide your foot toward your buttocks, bending your knee up off the floor (only go as far as is comfortable). Then slowly slide your foot back down until your leg is flat on the floor again.  °• Angel Wings: Lying on your back spread your legs to the side as far apart as you can without causing discomfort.  °• Hamstring Strength:  Lying on your back, push your heel against the floor with your leg straight by tightening up the   muscles of your buttocks.  Repeat, but this time bend your knee to a comfortable angle, and push your heel against the floor.  You may put a pillow under the heel to make it more comfortable if necessary.  ° °A rehabilitation program following joint replacement surgery can speed recovery and prevent re-injury in the future due to weakened muscles. Contact your doctor or a physical therapist for more information on knee rehabilitation.  ° ° °CONSTIPATION ° °Constipation is defined medically as fewer than three stools per week and severe constipation as less than one stool per week.  Even if you have a regular bowel pattern at home, your normal regimen is likely to be disrupted due to multiple reasons following surgery.  Combination of anesthesia, postoperative narcotics, change in appetite and fluid intake all can affect your bowels.  ° °YOU MUST use at least one of the following options; they are listed in order of increasing strength to get the job done.  They are all available over the counter, and you may need to use some,  POSSIBLY even all of these options:   ° °Drink plenty of fluids (prune juice may be helpful) and high fiber foods °Colace 100 mg by mouth twice a day  °Senokot for constipation as directed and as needed Dulcolax (bisacodyl), take with full glass of water  °Miralax (polyethylene glycol) once or twice a day as needed. ° °If you have tried all these things and are unable to have a bowel movement in the first 3-4 days after surgery call either your surgeon or your primary doctor.   ° °If you experience loose stools or diarrhea, hold the medications until you stool forms back up.  If your symptoms do not get better within 1 week or if they get worse, check with your doctor.  If you experience "the worst abdominal pain ever" or develop nausea or vomiting, please contact the office immediately for further recommendations for treatment. ° ° °ITCHING:  If you experience itching with your medications, try taking only a single pain pill, or even half a pain pill at a time.  You can also use Benadryl over the counter for itching or also to help with sleep.  ° °TED HOSE STOCKINGS:  Use stockings on both legs until for at least 2 weeks or as directed by physician office. They may be removed at night for sleeping. ° °MEDICATIONS:  See your medication summary on the “After Visit Summary” that nursing will review with you.  You may have some home medications which will be placed on hold until you complete the course of blood thinner medication.  It is important for you to complete the blood thinner medication as prescribed. ° °PRECAUTIONS:  If you experience chest pain or shortness of breath - call 911 immediately for transfer to the hospital emergency department.  ° °If you develop a fever greater that 101 F, purulent drainage from wound, increased redness or drainage from wound, foul odor from the wound/dressing, or calf pain - CONTACT YOUR SURGEON.   °                                                °FOLLOW-UP APPOINTMENTS:  If  you do not already have a post-op appointment, please call the office for an appointment to be seen by your surgeon.  Guidelines for how   soon to be seen are listed in your “After Visit Summary”, but are typically between 1-4 weeks after surgery. ° °OTHER INSTRUCTIONS:  ° °Knee Replacement:  Do not place pillow under knee, focus on keeping the knee straight while resting. CPM instructions: 0-90 degrees, 2 hours in the morning, 2 hours in the afternoon, and 2 hours in the evening. Place foam block, curve side up under heel at all times except when in CPM or when walking.  DO NOT modify, tear, cut, or change the foam block in any way. ° °MAKE SURE YOU:  °• Understand these instructions.  °• Get help right away if you are not doing well or get worse.  ° ° °Thank you for letting us be a part of your medical care team.  It is a privilege we respect greatly.  We hope these instructions will help you stay on track for a fast and full recovery!  ° °

## 2015-06-01 NOTE — Op Note (Signed)
Christopher Yu, Christopher Yu NO.:  [id number]  MEDICAL RECORD NO.:  [id number]  LOCATION:  [facility]                         FACILITY:  [facility]  PHYSICIAN:  Christopher Yu. Christopher Yu, M.D.DATE OF BIRTH:  1961/05/01  DATE OF PROCEDURE:  06/01/2015 DATE OF DISCHARGE:                              OPERATIVE REPORT   PREOPERATIVE DIAGNOSIS:  Rheumatoid arthritis and degenerative joint disease, right knee.  POSTOPERATIVE DIAGNOSIS:  Rheumatoid arthritis and degenerative joint disease, right knee.  PROCEDURE:  Right total knee arthroplasty.  IMPLANTS:  Stryker Triathlon knee with size 6 femur, size 6 tibial tray, 9 mm fix bearing polyethylene insert, size 35 patellar button.  SURGEON:  Christopher Poisson, MD.  ASSISTANT:  Christopher Canal, PA-C.  ANESTHESIA: 1. Regional right lower extremity adductor Yu block. 2. General.  TOURNIQUET TIME:  Under 1 hour.  ANTIBIOTICS:  2 g IV Ancef.  BLOOD LOSS:  Less than 200 mL.  COMPLICATIONS:  None.  INDICATIONS:  Christopher Yu is a very pleasant 55 year old gentleman with rheumatoid arthritis.  He has had at least two arthroscopic interventions of his right knee.  His swelling is daily, greatly and detrimentally affected his activities of daily living, his mobility, and his quality of life.  His pain has been quite extensive, and he has failed other forms of conservative treatment as well, including supplement injections and steroid injections in his knee.  At this point, he wished to proceed with a total knee arthroplasty.  He understands the risk of acute blood loss anemia, nerve and vessel injury, fracture, infection, and DVT.  He understands our goals are decreased pain, improved mobility, and overall improved quality of life.  PROCEDURE DESCRIPTION:  After informed consent was obtained, appropriate right knee was marked.  He was brought to the operating room after an adductor Yu block was placed to the right lower  extremity by Anesthesia in the holding room.  In the operating room, general anesthesia was obtained while he was on the operating table.  A nonsterile tourniquet was placed on his upper right thigh, and his right leg was prepped and draped from the thigh down the ankle with DuraPrep and sterile drapes, including a sterile stockinette.  A time-out was called to identify the correct patient, correct right knee.  We then used an Esmarch to wrap out the leg, and the tourniquet was inflated to 300 mmHg of pressure.  We then made a direct midline incision over the patella and carried this proximally and distally.  We dissected down the knee joint and performed a medial parapatellar arthrotomy.  We found a large joint effusion.  We found the lateral compartment was almost devoid of cartilage as well as patellofemoral joint with significant synovitis throughout the knee.  We cleaned as much synovium from the knee as we could and remnants of the ACL, PCL, and medial and lateral meniscus.  With the knee in a flexed position, using the extramedullary cutting guide, we set our cut to take 9 mm off the high side.  We made this cut without difficulty.  We then went for an intramedullary guide through the notch of the femur and the intercondylar area for setting our distal femoral cutting block  at 10 with taking 10 mm off the distal femoral cut and setting our rotation for 5 degrees externally rotated right.  We then made this cut without difficulty.  We brought the knee back down into full extension.  With a 9 mm extension block, we achieved full extension.  We went back to the femur and put our femoral sizing guide based off 3 degrees right and based off the epicondylar axis and Whiteside's line.  We then chose a size 6 femur.  We placed a 4-in-1 cutting block for a size 6 femur, made our anterior and posterior cuts, followed by our chamfer cuts, and then we made our femoral box cut.  We went back to  the tibia.  We trialed for a size 6 tibia in setting the rotation off the femur and the tibial tubercle.  We then made our keel cut for the tibial component.  We placed the trial 6 tibia, followed by the trial 6 femur.  We placed the 9 mm fix bearing polyethylene insert for a size 6 tibial tray and then made our patellar cut and drilled three holes for a patella button size 35.  With all trial components in place, we were pleased with stability and range of motion.  We then removed all trial components and irrigated the knee with normal saline solution using pulsatile lavage.  We then mixed our cement and cemented the real Stryker Triathlon tibial tray size 6 and the real size 6 femur. We cleaned cement debris from the knee and placed the real fix bearing 9 mm polyethylene insert.  We then cemented our patellar button.  Once the cement had hardened, we removed more cement debris from the knee.  We then let the tourniquet down.  Hemostasis was obtained with electrocautery.  We then irrigated the knee with normal saline solution again using pulsatile lavage.  We closed the arthrotomy with interrupted #1 Vicryl suture, followed by 0 Vicryl in the deep tissue, 2-0 Vicryl in the subcutaneous tissue, 4-0 Monocryl subcuticular stitch, and Steri- Strips on the skin.  He was then awakened, extubated, and taken to recovery room in stable condition.  All final counts were correct. There were no complications noted.  Of note, Christopher Canal, PA-C assisted throughout the entire case.  His assistance was crucial for facilitating all aspects of this case.     Christopher Yu. Christopher Yu, M.D.     CYB/MEDQ  D:  06/01/2015  T:  06/01/2015  Job:  [id number]

## 2015-06-01 NOTE — Brief Op Note (Signed)
06/01/2015  11:47 AM  PATIENT:  Christopher Yu  55 y.o. male  PRE-OPERATIVE DIAGNOSIS:  Right knee osteoarthritis  POST-OPERATIVE DIAGNOSIS:  Right knee osteoarthritis  PROCEDURE:  Procedure(s) with comments: RIGHT TOTAL KNEE ARTHROPLASTY (Right) - Block+general  SURGEON:  Surgeon(s) and Role:    * Kathryne Hitch, MD - Primary  PHYSICIAN ASSISTANT: Rexene Edison, PA-C  ANESTHESIA:   regional and general  EBL:  Total I/O In: 1000 [I.V.:1000] Out: -   COUNTS:  YES  TOURNIQUET:   Total Tourniquet Time Documented: Thigh (Right) - 51 minutes Total: Thigh (Right) - 51 minutes   DICTATION: .Other Dictation: Dictation Number [id number]  PLAN OF CARE: Admit to inpatient   PATIENT DISPOSITION:  PACU - hemodynamically stable.   Delay start of Pharmacological VTE agent (>24hrs) due to surgical blood loss or risk of bleeding: no

## 2015-06-01 NOTE — Transfer of Care (Signed)
Immediate Anesthesia Transfer of Care Note  Patient: Christopher Yu  Procedure(s) Performed: Procedure(s) with comments: RIGHT TOTAL KNEE ARTHROPLASTY (Right) - Block+general  Patient Location: PACU  Anesthesia Type:GA combined with regional for post-op pain  Level of Consciousness:  sedated, patient cooperative and responds to stimulation  Airway & Oxygen Therapy:Patient Spontanous Breathing and Patient connected to face mask oxgen  Post-op Assessment:  Report given to PACU RN and Post -op Vital signs reviewed and stable  Post vital signs:  Reviewed and stable  Last Vitals:  Filed Vitals:   06/01/15 0954 06/01/15 0955  BP:    Pulse: 62 64  Temp:    Resp: 18 14    Complications: No apparent anesthesia complications

## 2015-06-01 NOTE — Progress Notes (Signed)
Assisted Dr. M. Judd with right, ultrasound guided, adductor canal block. Side rails up, monitors on throughout procedure. See vital signs in flow sheet. Tolerated Procedure well.  

## 2015-06-01 NOTE — Evaluation (Signed)
Physical Therapy Evaluation Patient Details Name: Christopher Yu MRN: [id number] DOB: May 26, 1960 Today's Date: 06/01/2015   History of Present Illness  R TKR; Pt with hx of RA and s/p Bil ankle fusion  Clinical Impression  Pt s/p R TKR presents with decreased R LE strength/ROM and post op pain limiting functional mobility.  Pt should progress to dc home with HHPT follow up and limited assist of family (wife plans return to work Monday).    Follow Up Recommendations Home health PT    Equipment Recommendations  None recommended by PT    Recommendations for Other Services OT consult     Precautions / Restrictions Precautions Precautions: Knee;Fall Required Braces or Orthoses: Knee Immobilizer - Right Knee Immobilizer - Right: Discontinue once straight leg raise with < 10 degree lag Restrictions Weight Bearing Restrictions: No      Mobility  Bed Mobility Overal bed mobility: Needs Assistance Bed Mobility: Supine to Sit     Supine to sit: Min assist;Mod assist     General bed mobility comments: cues for sequence and use of L LE to self assist  Transfers Overall transfer level: Needs assistance Equipment used: Rolling walker (2 wheeled) Transfers: Sit to/from Stand Sit to Stand: Min assist;From elevated surface         General transfer comment: cues for LE management and use of UEs to self assist  Ambulation/Gait Ambulation/Gait assistance: Min assist;+2 physical assistance;+2 safety/equipment Ambulation Distance (Feet): 5 Feet Assistive device: Rolling walker (2 wheeled) Gait Pattern/deviations: Step-to pattern;Decreased step length - right;Decreased step length - left;Shuffle;Trunk flexed Gait velocity: decreased   General Gait Details: cues for posture, sequence and position from [employer]            Wheelchair Mobility    Modified Rankin (Stroke Patients Only)       Balance                                              Pertinent Vitals/Pain Pain Assessment: 0-10 Pain Score: 8  Pain Location: R knee Pain Descriptors / Indicators: Aching;Sore Pain Intervention(s): Limited activity within patient's tolerance;Monitored during session;Premedicated before session;Ice applied    Home Living Family/patient expects to be discharged to:: Private residence Living Arrangements: Spouse/significant other (Wife says going back to work on Monday) Available Help at Discharge: Family Type of Home: House Home Access: Stairs to enter Entrance Stairs-Rails: Right Entrance Stairs-Number of Steps: 3 Home Layout: Two level;Able to live on main level with bedroom/bathroom Home Equipment: Dan Humphreys - 2 wheels      Prior Function Level of Independence: Independent               Hand Dominance        Extremity/Trunk Assessment   Upper Extremity Assessment: Overall WFL for tasks assessed           Lower Extremity Assessment: RLE deficits/detail      Cervical / Trunk Assessment: Kyphotic  Communication   Communication: No difficulties  Cognition Arousal/Alertness: Awake/alert Behavior During Therapy: WFL for tasks assessed/performed Overall Cognitive Status: Within Functional Limits for tasks assessed                      General Comments      Exercises        Assessment/Plan    PT Assessment Patient needs continued PT services  PT Diagnosis Difficulty walking   PT Problem List Decreased strength;Decreased range of motion;Decreased activity tolerance;Decreased mobility;Decreased knowledge of use of DME;Pain  PT Treatment Interventions DME instruction;Gait training;Stair training;Functional mobility training;Therapeutic activities;Therapeutic exercise;Patient/family education   PT Goals (Current goals can be found in the Care Plan section) Acute Rehab PT Goals Patient Stated Goal: Home PT Goal Formulation: With patient Time For Goal Achievement: 06/05/15 Potential to Achieve  Goals: Good    Frequency 7X/week   Barriers to discharge        Co-evaluation               End of Session Equipment Utilized During Treatment: Gait belt;Right knee immobilizer Activity Tolerance: Patient tolerated treatment well Patient left: in chair;with call bell/phone within reach;with chair alarm set;with family/visitor present Nurse Communication: Mobility status         Time: 8032-1224 PT Time Calculation (min) (ACUTE ONLY): 20 min   Charges:   PT Evaluation $PT Eval Low Complexity: 1 Procedure     PT G Codes:        Christopher Yu 06-08-15, 5:24 PM

## 2015-06-01 NOTE — Anesthesia Procedure Notes (Addendum)
Anesthesia Regional Block:  Adductor canal block  Pre-Anesthetic Checklist: ,, timeout performed, Correct Patient, Correct Site, Correct Laterality, Correct Procedure, Correct Position, site marked, Risks and benefits discussed,  Surgical consent,  Pre-op evaluation,  At surgeon's request and post-op pain management  Laterality: Right  Prep: chloraprep       Needles:  Injection technique: Single-shot  Needle Type: Echogenic Stimulator Needle      Needle Gauge: 21 and 21 G    Additional Needles:  Procedures: ultrasound guided (picture in chart) Adductor canal block Narrative:  Injection made incrementally with aspirations every 5 mL.  Performed by: Personally  Anesthesiologist: Karie Schwalbe  Additional Notes: Patient tolerated the procedure well without complications   Procedure Name: Intubation Date/Time: 06/01/2015 10:20 AM Performed by: Enriqueta Shutter D Pre-anesthesia Checklist: Patient identified, Emergency Drugs available, Suction available and Patient being monitored Patient Re-evaluated:Patient Re-evaluated prior to inductionOxygen Delivery Method: Circle System Utilized Preoxygenation: Pre-oxygenation with 100% oxygen Intubation Type: IV induction Ventilation: Mask ventilation without difficulty Laryngoscope Size: Mac and 4 Grade View: Grade III Tube type: Oral Tube size: 7.5 mm Number of attempts: 1 Airway Equipment and Method: Stylet,  Oral airway and Bougie stylet Placement Confirmation: ETT inserted through vocal cords under direct vision,  positive ETCO2 and breath sounds checked- equal and bilateral Tube secured with: Tape Dental Injury: Teeth and Oropharynx as per pre-operative assessment

## 2015-06-01 NOTE — Anesthesia Preprocedure Evaluation (Signed)
Anesthesia Evaluation  Patient identified by MRN, date of birth, ID band Patient awake    Reviewed: Allergy & Precautions, NPO status , Patient's Chart, lab work & pertinent test results  History of Anesthesia Complications Negative for: history of anesthetic complications  Airway Mallampati: III  TM Distance: >3 FB Neck ROM: Full    Dental no notable dental hx. (+) Dental Advisory Given   Pulmonary sleep apnea , former smoker,    Pulmonary exam normal breath sounds clear to auscultation       Cardiovascular + CAD  Normal cardiovascular exam Rhythm:Regular Rate:Normal  Cardiac clearance per Dr Donnie Aho on the chart. Presented with abnormal EKG in 12/16 and workup discovered EF 35-40%. Patient remains NYHA class 1. Close cardiac follow up.    Neuro/Psych negative neurological ROS  negative psych ROS   GI/Hepatic negative GI ROS, Neg liver ROS,   Endo/Other  obesity  Renal/GU negative Renal ROS  negative genitourinary   Musculoskeletal  (+) Arthritis ,   Abdominal   Peds negative pediatric ROS (+)  Hematology negative hematology ROS (+)   Anesthesia Other Findings   Reproductive/Obstetrics negative OB ROS                             Anesthesia Physical Anesthesia Plan  ASA: III  Anesthesia Plan: General   Post-op Pain Management:    Induction: Intravenous  Airway Management Planned: LMA and Oral ETT  Additional Equipment:   Intra-op Plan:   Post-operative Plan: Extubation in OR  Informed Consent: I have reviewed the patients History and Physical, chart, labs and discussed the procedure including the risks, benefits and alternatives for the proposed anesthesia with the patient or authorized representative who has indicated his/her understanding and acceptance.   Dental advisory given  Plan Discussed with: CRNA  Anesthesia Plan Comments:         Anesthesia Quick  Evaluation

## 2015-06-02 LAB — CBC
HCT: 31.2 % — ABNORMAL LOW (ref 39.0–52.0)
Hemoglobin: 10.2 g/dL — ABNORMAL LOW (ref 13.0–17.0)
MCH: 30.3 pg (ref 26.0–34.0)
MCHC: 32.7 g/dL (ref 30.0–36.0)
MCV: 92.6 fL (ref 78.0–100.0)
PLATELETS: 215 10*3/uL (ref 150–400)
RBC: 3.37 MIL/uL — AB (ref 4.22–5.81)
RDW: 15.4 % (ref 11.5–15.5)
WBC: 10.4 10*3/uL (ref 4.0–10.5)

## 2015-06-02 LAB — BASIC METABOLIC PANEL
Anion gap: 8 (ref 5–15)
BUN: 14 mg/dL (ref 6–20)
CALCIUM: 7.8 mg/dL — AB (ref 8.9–10.3)
CO2: 26 mmol/L (ref 22–32)
CREATININE: 1.01 mg/dL (ref 0.61–1.24)
Chloride: 103 mmol/L (ref 101–111)
GFR calc Af Amer: >60 mL/min (ref >60)
Glucose, Bld: 121 mg/dL — ABNORMAL HIGH (ref 65–99)
POTASSIUM: 4.1 mmol/L (ref 3.5–5.1)
SODIUM: 137 mmol/L (ref 135–145)

## 2015-06-02 NOTE — Progress Notes (Signed)
Physical Therapy Treatment Patient Details Name: Christopher Yu MRN: [id number] DOB: 05-03-1961 Today's Date: 06/02/2015    History of Present Illness R TKR; Pt with hx of RA and s/p Bil ankle fusion    PT Comments    Pt motivated but progressing slowly 2* pain.  Follow Up Recommendations  Home health PT     Equipment Recommendations  None recommended by PT    Recommendations for Other Services OT consult     Precautions / Restrictions Precautions Precautions: Knee;Fall Required Braces or Orthoses: Knee Immobilizer - Right Knee Immobilizer - Right: Discontinue once straight leg raise with < 10 degree lag Restrictions Weight Bearing Restrictions: No Other Position/Activity Restrictions: WBAT    Mobility  Bed Mobility Overal bed mobility: Needs Assistance Bed Mobility: Supine to Sit     Supine to sit: Min assist     General bed mobility comments: cues for sequence and use of L LE to self assist  Transfers Overall transfer level: Needs assistance Equipment used: Rolling walker (2 wheeled) Transfers: Sit to/from Stand Sit to Stand: Min assist;From elevated surface         General transfer comment: cues for LE management and use of UEs to self assist  Ambulation/Gait Ambulation/Gait assistance: Min assist Ambulation Distance (Feet): 41 Feet Assistive device: Rolling walker (2 wheeled) Gait Pattern/deviations: Step-to pattern;Decreased step length - right;Decreased step length - left;Shuffle;Trunk flexed Gait velocity: decreased   General Gait Details: cues for posture, sequence, stride length and position from [employer]            Wheelchair Mobility    Modified Rankin (Stroke Patients Only)       Balance                                    Cognition Arousal/Alertness: Awake/alert Behavior During Therapy: WFL for tasks assessed/performed Overall Cognitive Status: Within Functional Limits for tasks assessed                       Exercises Total Joint Exercises Ankle Circles/Pumps: AROM;Both;15 reps;Supine Quad Sets: AROM;Both;10 reps;Supine Heel Slides: AAROM;Right;10 reps;Supine Straight Leg Raises: AAROM;Right;10 reps;Supine    General Comments        Pertinent Vitals/Pain Pain Assessment: 0-10 Pain Score: 7  Pain Location: R knee Pain Descriptors / Indicators: Sore;Aching;Throbbing Pain Intervention(s): Limited activity within patient's tolerance;Monitored during session;Premedicated before session;Ice applied    Home Living                      Prior Function            PT Goals (current goals can now be found in the care plan section) Acute Rehab PT Goals Patient Stated Goal: Home PT Goal Formulation: With patient Time For Goal Achievement: 06/05/15 Potential to Achieve Goals: Good Progress towards PT goals: Progressing toward goals    Frequency  7X/week    PT Plan Current plan remains appropriate    Co-evaluation             End of Session Equipment Utilized During Treatment: Gait belt;Right knee immobilizer Activity Tolerance: Patient tolerated treatment well Patient left: in chair;with call bell/phone within reach;with chair alarm set;with family/visitor present     Time: 9518-8416 PT Time Calculation (min) (ACUTE ONLY): 40 min  Charges:  $Gait Training: 23-37 mins $Therapeutic Exercise: 8-22 mins  G Codes:      Christopher Yu 06/06/15, 11:37 AM

## 2015-06-02 NOTE — Progress Notes (Signed)
Subjective: 1 Day Post-Op Procedure(s) (LRB): RIGHT TOTAL KNEE ARTHROPLASTY (Right) Patient reports pain as 9 on 0-10 scale.    Objective: Vital signs in last 24 hours: Temp:  [97.6 F (36.4 C)-99 F (37.2 C)] 98.7 F (37.1 C) (01/28 0800) Pulse Rate:  [63-85] 67 (01/28 0800) Resp:  [10-18] 16 (01/28 0800) BP: (109-137)/(57-81) 116/74 mmHg (01/28 0800) SpO2:  [93 %-99 %] 96 % (01/28 0800) Weight:  [109.77 kg (242 lb)] 109.77 kg (242 lb) (01/27 1324)  Intake/Output from previous day: 01/27 0701 - 01/28 0700 In: 2727.5 [P.O.:1200; I.V.:1527.5] Out: 1325 [Urine:1300; Blood:25] Intake/Output this shift: Total I/O In: 360 [P.O.:360] Out: 925 [Urine:925]   Recent Labs  06/02/15 0518  HGB 10.2*    Recent Labs  06/02/15 0518  WBC 10.4  RBC 3.37*  HCT 31.2*  PLT 215    Recent Labs  06/02/15 0518  NA 137  K 4.1  CL 103  CO2 26  BUN 14  CREATININE 1.01  GLUCOSE 121*  CALCIUM 7.8*   No results for input(s): LABPT, INR in the last 72 hours.  Neurologically intact  Assessment/Plan: 1 Day Post-Op Procedure(s) (LRB): RIGHT TOTAL KNEE ARTHROPLASTY (Right) Up with therapy  Kham Zuckerman C 06/02/2015, 12:41 PM

## 2015-06-02 NOTE — Progress Notes (Signed)
OT Cancellation Note  Patient Details Name: ICHAEL PULLARA MRN: [id number] DOB: 1961/01/19   Cancelled Treatment:    Reason Eval/Treat Not Completed: Other (comment)  Pt with PT . OT explained role.  Pt prefers OT next day. Will see pt next day   Alba Cory 06/02/2015, 3:27 PM

## 2015-06-02 NOTE — Progress Notes (Signed)
Physical Therapy Treatment Patient Details Name: Christopher Yu MRN: [id number] DOB: December 27, 1960 Today's Date: 06/02/2015    History of Present Illness R TKR; Pt with hx of RA and s/p Bil ankle fusion    PT Comments    Steady progress with mobility.  Pt hopeful for dc Monday.  Follow Up Recommendations  Home health PT     Equipment Recommendations  None recommended by PT    Recommendations for Other Services OT consult     Precautions / Restrictions Precautions Precautions: Knee;Fall Required Braces or Orthoses: Knee Immobilizer - Right Knee Immobilizer - Right: Discontinue once straight leg raise with < 10 degree lag Restrictions Weight Bearing Restrictions: No Other Position/Activity Restrictions: WBAT    Mobility  Bed Mobility Overal bed mobility: Needs Assistance Bed Mobility: Sit to Supine       Sit to supine: Min assist   General bed mobility comments: cues for sequence and use of L LE to self assist  Transfers Overall transfer level: Needs assistance Equipment used: Rolling walker (2 wheeled) Transfers: Sit to/from Stand Sit to Stand: Min assist         General transfer comment: cues for LE management and use of UEs to self assist  Ambulation/Gait Ambulation/Gait assistance: Min assist;Min guard Ambulation Distance (Feet): 54 Feet (and 15 back from bathroom) Assistive device: Rolling walker (2 wheeled) Gait Pattern/deviations: Step-to pattern;Decreased step length - right;Decreased step length - left;Shuffle;Trunk flexed Gait velocity: decreased   General Gait Details: cues for posture, sequence, stride length and position from [employer]            Wheelchair Mobility    Modified Rankin (Stroke Patients Only)       Balance                                    Cognition Arousal/Alertness: Awake/alert Behavior During Therapy: WFL for tasks assessed/performed Overall Cognitive Status: Within Functional Limits for  tasks assessed                      Exercises      General Comments        Pertinent Vitals/Pain Pain Assessment: 0-10 Pain Score: 6  Pain Location: R knee Pain Descriptors / Indicators: Aching;Sore Pain Intervention(s): Limited activity within patient's tolerance;Monitored during session;Premedicated before session;Ice applied    Home Living                      Prior Function            PT Goals (current goals can now be found in the care plan section) Acute Rehab PT Goals Patient Stated Goal: Home PT Goal Formulation: With patient Time For Goal Achievement: 06/05/15 Potential to Achieve Goals: Good Progress towards PT goals: Progressing toward goals    Frequency  7X/week    PT Plan Current plan remains appropriate    Co-evaluation             End of Session Equipment Utilized During Treatment: Gait belt;Right knee immobilizer Activity Tolerance: Patient tolerated treatment well Patient left: in bed;with call bell/phone within reach;with family/visitor present     Time: 3086-5784 PT Time Calculation (min) (ACUTE ONLY): 24 min  Charges:  $Gait Training: 8-22 mins $Therapeutic Activity: 8-22 mins  G Codes:      Christopher Yu 06/27/15, 3:50 PM

## 2015-06-03 NOTE — Progress Notes (Signed)
Physical Therapy Treatment Patient Details Name: Christopher Yu MRN: [id number] DOB: 1961/05/04 Today's Date: 06/03/2015    History of Present Illness R TKR; Pt with hx of RA and s/p Bil ankle fusion    PT Comments    Pt motivated and with increased confidence re: d/c home.  Reviewed don/doff KI and stairs with written information provided.  Follow Up Recommendations  Home health PT     Equipment Recommendations  None recommended by PT    Recommendations for Other Services OT consult     Precautions / Restrictions Precautions Precautions: Knee;Fall Required Braces or Orthoses: Knee Immobilizer - Right Knee Immobilizer - Right: Discontinue once straight leg raise with < 10 degree lag Restrictions Weight Bearing Restrictions: No Other Position/Activity Restrictions: WBAT    Mobility  Bed Mobility               General bed mobility comments: NT, pt in chair and declines back to bed  Transfers Overall transfer level: Needs assistance Equipment used: Rolling walker (2 wheeled) Transfers: Sit to/from Stand Sit to Stand: Supervision         General transfer comment: cue for LE management  Ambulation/Gait Ambulation/Gait assistance: Supervision Ambulation Distance (Feet): 48 Feet Assistive device: Rolling walker (2 wheeled) Gait Pattern/deviations: Step-to pattern;Decreased step length - right;Decreased step length - left;Shuffle;Trunk flexed Gait velocity: decreased Gait velocity interpretation: Below normal speed for age/gender General Gait Details: min cues for posture, sequence, and position from RW   Stairs Stairs: Yes Stairs assistance: Min assist Stair Management: One rail Right;Step to pattern;Forwards;With crutches Number of Stairs: 4 General stair comments: cues for sequence and foot/crutch placement  Wheelchair Mobility    Modified Rankin (Stroke Patients Only)       Balance                                     Cognition Arousal/Alertness: Awake/alert Behavior During Therapy: WFL for tasks assessed/performed Overall Cognitive Status: Within Functional Limits for tasks assessed                      Exercises      General Comments        Pertinent Vitals/Pain Pain Assessment: 0-10 Pain Score: 6  Pain Location: R knee Pain Descriptors / Indicators: Aching;Sore Pain Intervention(s): Limited activity within patient's tolerance;Monitored during session;Premedicated before session;Ice applied    Home Living Family/patient expects to be discharged to:: Private residence Living Arrangements: Spouse/significant other (Wife says going back to work on Monday) Available Help at Discharge: Family Type of Home: House Home Access: Stairs to enter Entrance Stairs-Rails: Right Home Layout: Two level;Able to live on main level with bedroom/bathroom Home Equipment: Walker - 2 wheels      Prior Function Level of Independence: Independent          PT Goals (current goals can now be found in the care plan section) Acute Rehab PT Goals Patient Stated Goal: Home PT Goal Formulation: With patient Time For Goal Achievement: 06/05/15 Potential to Achieve Goals: Good Progress towards PT goals: Progressing toward goals    Frequency  7X/week    PT Plan Current plan remains appropriate    Co-evaluation             End of Session Equipment Utilized During Treatment: Gait belt;Right knee immobilizer Activity Tolerance: Patient tolerated treatment well Patient left: in chair;with call bell/phone within reach  Time: 1330-1400 PT Time Calculation (min) (ACUTE ONLY): 30 min  Charges:  $Gait Training: 8-22 mins $Therapeutic Activity: 8-22 mins                    G Codes:      Rayven Hendrickson 2015-06-10, 2:15 PM

## 2015-06-03 NOTE — Progress Notes (Signed)
Physical Therapy Treatment Patient Details Name: Christopher Yu MRN: [id number] DOB: Sep 12, 1960 Today's Date: 06/03/2015    History of Present Illness R TKR; Pt with hx of RA and s/p Bil ankle fusion    PT Comments    Pt continues cooperative and progressing steadily with mobility.  ROM at R knee continues pain ltd.  Follow Up Recommendations  Home health PT     Equipment Recommendations  None recommended by PT    Recommendations for Other Services OT consult     Precautions / Restrictions Precautions Precautions: Knee;Fall Required Braces or Orthoses: Knee Immobilizer - Right Knee Immobilizer - Right: Discontinue once straight leg raise with < 10 degree lag Restrictions Weight Bearing Restrictions: No Other Position/Activity Restrictions: WBAT    Mobility  Bed Mobility Overal bed mobility: Needs Assistance Bed Mobility: Supine to Sit     Supine to sit: Min assist     General bed mobility comments: cues for sequence and use of L LE to self assist; min physical assist for R LE  Transfers Overall transfer level: Needs assistance Equipment used: Rolling walker (2 wheeled) Transfers: Sit to/from Stand Sit to Stand: Min guard         General transfer comment: min cues for LE management and use of UEs to self assist  Ambulation/Gait Ambulation/Gait assistance: Min assist;Min guard Ambulation Distance (Feet): 100 Feet Assistive device: Rolling walker (2 wheeled) Gait Pattern/deviations: Step-to pattern;Decreased step length - right;Decreased step length - left;Shuffle;Trunk flexed Gait velocity: decreased Gait velocity interpretation: Below normal speed for age/gender General Gait Details: min cues for posture, sequence, and position from [employer]            Wheelchair Mobility    Modified Rankin (Stroke Patients Only)       Balance                                    Cognition Arousal/Alertness: Awake/alert Behavior  During Therapy: WFL for tasks assessed/performed Overall Cognitive Status: Within Functional Limits for tasks assessed                      Exercises Total Joint Exercises Ankle Circles/Pumps: AROM;Both;15 reps;Supine Quad Sets: AROM;Both;Supine;15 reps Heel Slides: AAROM;Right;Supine;15 reps Straight Leg Raises: Right;Supine;AAROM;15 reps Goniometric ROM: AAROM R knee -8 - 40    General Comments        Pertinent Vitals/Pain Pain Assessment: 0-10 Pain Score: 6  Pain Location: R knee Pain Descriptors / Indicators: Aching;Sore Pain Intervention(s): Limited activity within patient's tolerance;Monitored during session;Premedicated before session;Ice applied    Home Living                      Prior Function            PT Goals (current goals can now be found in the care plan section) Acute Rehab PT Goals Patient Stated Goal: Home PT Goal Formulation: With patient Time For Goal Achievement: 06/05/15 Potential to Achieve Goals: Good Progress towards PT goals: Progressing toward goals    Frequency  7X/week    PT Plan Current plan remains appropriate    Co-evaluation             End of Session Equipment Utilized During Treatment: Gait belt;Right knee immobilizer Activity Tolerance: Patient tolerated treatment well Patient left: in chair;with call bell/phone within reach     Time: [phone]  PT Time Calculation (min) (ACUTE ONLY): 52 min  Charges:  $Gait Training: 8-22 mins $Therapeutic Exercise: 23-37 mins                    G Codes:      Arisha Gervais 06/10/2015, 9:26 AM

## 2015-06-03 NOTE — Progress Notes (Signed)
Subjective: 2 Days Post-Op Procedure(s) (LRB): RIGHT TOTAL KNEE ARTHROPLASTY (Right) Patient reports pain as moderate.    Objective: Vital signs in last 24 hours: Temp:  [98.8 F (37.1 C)-99 F (37.2 C)] 98.8 F (37.1 C) (01/29 0622) Pulse Rate:  [73-74] 73 (01/29 0622) Resp:  [16-17] 16 (01/29 0622) BP: (121-126)/(67-72) 121/72 mmHg (01/29 0622) SpO2:  [94 %-95 %] 94 % (01/29 0622)  Intake/Output from previous day: 01/28 0701 - 01/29 0700 In: 1680 [P.O.:1440; I.V.:240] Out: 1925 [Urine:1925] Intake/Output this shift: Total I/O In: -  Out: 600 [Urine:600]   Recent Labs  06/02/15 0518  HGB 10.2*    Recent Labs  06/02/15 0518  WBC 10.4  RBC 3.37*  HCT 31.2*  PLT 215    Recent Labs  06/02/15 0518  NA 137  K 4.1  CL 103  CO2 26  BUN 14  CREATININE 1.01  GLUCOSE 121*  CALCIUM 7.8*   No results for input(s): LABPT, INR in the last 72 hours.  Neurologically intact  Assessment/Plan: 2 Days Post-Op Procedure(s) (LRB): RIGHT TOTAL KNEE ARTHROPLASTY (Right) Up with therapy , d/c home late tomorrow afternoon.   YATES,MARK C 06/03/2015, 8:57 AM

## 2015-06-03 NOTE — Evaluation (Signed)
Occupational Therapy Evaluation Patient Details Name: Christopher Yu MRN: [id number] DOB: Jun 15, 1960 Today's Date: 06/03/2015    History of Present Illness R TKR; Pt with hx of RA and s/p Bil ankle fusion   Clinical Impression   OT education complete regarding ADL activity s/p TKR. No DME needs    Follow Up Recommendations  No OT follow up    Equipment Recommendations  None recommended by OT    Recommendations for Other Services       Precautions / Restrictions Precautions Precautions: Knee;Fall Required Braces or Orthoses: Knee Immobilizer - Right Knee Immobilizer - Right: Discontinue once straight leg raise with < 10 degree lag Restrictions Weight Bearing Restrictions: No Other Position/Activity Restrictions: WBAT      Mobility Bed Mobility Overal bed mobility: Needs Assistance Bed Mobility: Supine to Sit     Supine to sit: Min assist     General bed mobility comments: pt in chair  Transfers Overall transfer level: Needs assistance Equipment used: Rolling walker (2 wheeled) Transfers: Sit to/from Stand Sit to Stand: Min assist         General transfer comment: min cues for LE management and use of UEs to self assist    Balance                                            ADL Overall ADL's : Needs assistance/impaired Eating/Feeding: Modified independent   Grooming: Set up;Sitting   Upper Body Bathing: Set up;Sitting   Lower Body Bathing: Sit to/from stand;Minimal assistance;With adaptive equipment;Cueing for safety   Upper Body Dressing : Set up;Sitting   Lower Body Dressing: Minimal assistance;Sit to/from stand;Cueing for sequencing;Cueing for safety Lower Body Dressing Details (indicate cue type and reason): instructed in AE - pt is not interested     Toileting- Architect and Hygiene: Min guard;Sit to/from stand;Cueing for safety               Vision     Perception     Praxis      Pertinent  Vitals/Pain Pain Assessment: 0-10 Pain Score: 6  Pain Location: R knee Pain Descriptors / Indicators: Aching;Sore Pain Intervention(s): Limited activity within patient's tolerance;Monitored during session;Premedicated before session;Ice applied     Hand Dominance     Extremity/Trunk Assessment Upper Extremity Assessment Upper Extremity Assessment: Overall WFL for tasks assessed           Communication Communication Communication: No difficulties   Cognition Arousal/Alertness: Awake/alert Behavior During Therapy: WFL for tasks assessed/performed Overall Cognitive Status: Within Functional Limits for tasks assessed                     General Comments       Exercises Exercises: Total Joint     Shoulder Instructions      Home Living Family/patient expects to be discharged to:: Private residence Living Arrangements: Spouse/significant other (Wife says going back to work on Monday) Available Help at Discharge: Family Type of Home: House Home Access: Stairs to enter Secretary/administrator of Steps: 3 Entrance Stairs-Rails: Right Home Layout: Two level;Able to live on main level with bedroom/bathroom               Home Equipment: Walker - 2 wheels          Prior Functioning/Environment Level of Independence: Independent  OT Diagnosis:     OT Problem List:     OT Treatment/Interventions:      OT Goals(Current goals can be found in the care plan section) Acute Rehab OT Goals Patient Stated Goal: Home  OT Frequency:     Barriers to D/C:            Co-evaluation              End of Session Equipment Utilized During Treatment: Rolling walker CPM Right Knee CPM Right Knee: Off Nurse Communication: Mobility status  Activity Tolerance: Patient tolerated treatment well Patient left: in chair;with call bell/phone within reach   Time: 1000-1025 OT Time Calculation (min): 25 min Charges:  OT General Charges $OT Visit: 1  Procedure OT Evaluation $OT Eval Low Complexity: 1 Procedure OT Treatments $Self Care/Home Management : 8-22 mins G-Codes:    Einar Crow D 06/16/15, 10:40 AM

## 2015-06-04 MED ORDER — OXYCODONE-ACETAMINOPHEN 5-325 MG PO TABS: 1.0000 | ORAL_TABLET | ORAL | Status: DC | PRN

## 2015-06-04 MED ORDER — METHOCARBAMOL 500 MG PO TABS: 500.0000 mg | ORAL_TABLET | Freq: Four times a day (QID) | ORAL | Status: DC | PRN

## 2015-06-04 MED ORDER — RIVAROXABAN 10 MG PO TABS
10.0000 mg | ORAL_TABLET | Freq: Every day | ORAL | Status: DC
Start: 2015-06-04 — End: 2018-08-20

## 2015-06-04 NOTE — Progress Notes (Signed)
Subjective: 3 Days Post-Op Procedure(s) (LRB): RIGHT TOTAL KNEE ARTHROPLASTY (Right) Patient reports pain as moderate.    Objective: Vital signs in last 24 hours: Temp:  [98.3 F (36.8 C)-99.5 F (37.5 C)] 99.5 F (37.5 C) (01/30 0600) Pulse Rate:  [98-117] 98 (01/30 0600) Resp:  [18-20] 18 (01/30 0600) BP: (111-130)/(66-76) 111/66 mmHg (01/30 0600) SpO2:  [94 %-96 %] 94 % (01/30 0600)  Intake/Output from previous day: 01/29 0701 - 01/30 0700 In: 840 [P.O.:840] Out: 600 [Urine:600] Intake/Output this shift:     Recent Labs  06/02/15 0518  HGB 10.2*    Recent Labs  06/02/15 0518  WBC 10.4  RBC 3.37*  HCT 31.2*  PLT 215    Recent Labs  06/02/15 0518  NA 137  K 4.1  CL 103  CO2 26  BUN 14  CREATININE 1.01  GLUCOSE 121*  CALCIUM 7.8*   No results for input(s): LABPT, INR in the last 72 hours.  Sensation intact distally Intact pulses distally Dorsiflexion/Plantar flexion intact Incision: dressing C/D/I No cellulitis present Compartment soft  Assessment/Plan: 3 Days Post-Op Procedure(s) (LRB): RIGHT TOTAL KNEE ARTHROPLASTY (Right) Discharge home with home health today  Kathryne Hitch 06/04/2015, 7:30 AM

## 2015-06-04 NOTE — Progress Notes (Addendum)
Physical Therapy Treatment Patient Details Name: Christopher Yu MRN: [id number] DOB: 1960-07-26 Today's Date: 06/04/2015    History of Present Illness R TKR; Pt with hx of RA and s/p Bil ankle fusion    PT Comments    POD # 3 pt eager to D/C to home.  Pt dressed and in recliner with KI on.  Had many questions about when to wear KI.  Sleeping positions.  Given HEP.  Instructed on freq. Instructed on use of ICE.  Asked about car transfers.  Reviewed stairs using one crutch and one rail.  Time spent 15 min addressing all therapy and mobility questions.    Follow Up Recommendations  Home health PT     Equipment Recommendations  None recommended by PT    Recommendations for Other Services       Precautions / Restrictions Precautions Precautions: Knee;Fall Restrictions Weight Bearing Restrictions: No Other Position/Activity Restrictions: WBAT       Balance                                    Cognition Arousal/Alertness: Awake/alert                          Exercises      General Comments        Pertinent Vitals/Pain Pain Assessment: 0-10 Pain Score: 3 Pain Location: R knee Pain Descriptors / Indicators: Aching;Sore Pain Intervention(s): Monitored during session;Premedicated before session;Repositioned    Home Living                      Prior Function            PT Goals (current goals can now be found in the care plan section) Progress towards PT goals: Progressing toward goals    Frequency  7X/week    PT Plan Current plan remains appropriate    Co-evaluation             End of Session Equipment Utilized During Treatment: Gait belt Activity Tolerance: Patient tolerated treatment well Patient left: in chair;with call bell/phone within reach     Time:  11:15 - 11:30                 G Codes:    1 hm  Felecia Shelling  PTA [facility]  Acute  Rehab Pager      [phone]

## 2015-06-04 NOTE — Discharge Summary (Signed)
Patient ID: TAJE LITTLER MRN: [id number] DOB/AGE: [birth year] 54 y.o.  Admit date: 06/01/2015 Discharge date: 06/04/2015  Admission Diagnoses:  Principal Problem:   Rheumatoid arthritis involving right knee [facility]) Active Problems:   Status post total right knee replacement   Discharge Diagnoses:  Same  Past Medical History  Diagnosis Date  . Rheumatoid aortitis (HCC)   . Arthritis, rheumatoid (HCC)     dx 1988  . Cardiomyopathy (HCC)   . Sleep apnea     does not use cpap - UNABLE TO TOLERATE MASK    Surgeries: Procedure(s): RIGHT TOTAL KNEE ARTHROPLASTY on 06/01/2015   Consultants:    Discharged Condition: Improved  Hospital Course: GONZALO WAYMIRE is an 55 y.o. male who was admitted 06/01/2015 for operative treatment ofRheumatoid arthritis involving right knee (HCC). Patient has severe unremitting pain that affects sleep, daily activities, and work/hobbies. After pre-op clearance the patient was taken to the operating room on 06/01/2015 and underwent  Procedure(s): RIGHT TOTAL KNEE ARTHROPLASTY.    Patient was given perioperative antibiotics: Anti-infectives    Start     Dose/Rate Route Frequency Ordered Stop   06/01/15 1600  ceFAZolin (ANCEF) IVPB 1 g/50 mL premix     1 g 100 mL/hr over 30 Minutes Intravenous Every 6 hours 06/01/15 1346 06/01/15 2152   06/01/15 0808  ceFAZolin (ANCEF) IVPB 2 g/50 mL premix     2 g 100 mL/hr over 30 Minutes Intravenous On call to O.R. 06/01/15 0808 06/01/15 1022       Patient was given sequential compression devices, early ambulation, and chemoprophylaxis to prevent DVT.  Patient benefited maximally from hospital stay and there were no complications.    Recent vital signs: Patient Vitals for the past 24 hrs:  BP Temp Temp src Pulse Resp SpO2  06/04/15 0600 111/66 mmHg 99.5 F (37.5 C) Oral 98 18 94 %  06/03/15 2044 130/72 mmHg 98.3 F (36.8 C) Oral (!) 117 20 96 %  06/03/15 1509 112/76 mmHg 99.1 F (37.3 C) Oral (!) 115 18  95 %     Recent laboratory studies:  Recent Labs  06/02/15 0518  WBC 10.4  HGB 10.2*  HCT 31.2*  PLT 215  NA 137  K 4.1  CL 103  CO2 26  BUN 14  CREATININE 1.01  GLUCOSE 121*  CALCIUM 7.8*     Discharge Medications:     Medication List    STOP taking these medications        HYDROcodone-acetaminophen 5-325 MG tablet  Commonly known as:  NORCO/VICODIN      TAKE these medications        aspirin 81 MG tablet  Take 81 mg by mouth daily.     folic acid 1 MG tablet  Commonly known as:  FOLVITE  Take 1 mg by mouth daily.     leflunomide 20 MG tablet  Commonly known as:  ARAVA  Take 20 mg by mouth daily.     lisinopril 10 MG tablet  Commonly known as:  PRINIVIL,ZESTRIL  Take 10 mg by mouth daily.     methocarbamol 500 MG tablet  Commonly known as:  ROBAXIN  Take 1 tablet (500 mg total) by mouth every 6 (six) hours as needed for muscle spasms.     methotrexate 2.5 MG tablet  Commonly known as:  RHEUMATREX  Take 20 mg by mouth once a week. Takes 8 tablets(20mg ) every Saturday.     Caution:Chemotherapy. Protect from light.  metoprolol succinate 25 MG 24 hr tablet  Commonly known as:  TOPROL-XL  Take 25 mg by mouth daily.     oxyCODONE-acetaminophen 5-325 MG tablet  Commonly known as:  ROXICET  Take 1-2 tablets by mouth every 4 (four) hours as needed.     predniSONE 10 MG tablet  Commonly known as:  DELTASONE  Take 5-20 mg by mouth See admin instructions. TAKE 20 MG X 3 DAYS, 15 MG X 3 DAYS, 10 MG X 3 DAYS, AND 5 MG X 3 DAYS. Last dose on 05/23/15.     rivaroxaban 10 MG Tabs tablet  Commonly known as:  XARELTO  Take 1 tablet (10 mg total) by mouth daily with breakfast.     sulfaSALAzine 500 MG tablet  Commonly known as:  AZULFIDINE  Take 1,000 mg by mouth 2 (two) times daily.     Vitamin D (Ergocalciferol) 50000 units Caps capsule  Commonly known as:  DRISDOL  Take 50,000 Units by mouth every 7 (seven) days. Saturdays        Diagnostic  Studies: Dg Knee Right Port  06/01/2015  CLINICAL DATA:  Status post right total knee replacement. EXAM: PORTABLE RIGHT KNEE - 1-2 VIEW COMPARISON:  None. FINDINGS: The femoral and tibial components appear to be well situated. Expected postoperative changes are noted in the anterior soft tissues. No fracture or dislocation is noted. IMPRESSION: Status post right total knee arthroplasty. Electronically Signed   By: Lupita Raider, M.D.   On: 06/01/2015 13:15    Disposition: 01-Home or Self Care      Discharge Instructions    Discharge patient    Complete by:  As directed            Follow-up Information    Follow up with Kathryne Hitch, MD In 2 weeks.   Specialty:  Orthopedic Surgery   Contact information:   [address] [place] [address] [phone]        Signed: Kathryne Hitch 06/04/2015, 7:32 AM

## 2015-06-04 NOTE — Care Management Note (Signed)
Case Management Note  Patient Details  Name: TAVARIUS GREWE MRN: [id number] Date of Birth: 1960-06-07  Subjective/Objective:   Status post total right knee replacement                 Action/Plan: Discharge planning, spoke with patient and spouse at bedside. Have chosen [facility] for [facility] PT. Contacted [facility] for referral. Has all DME at home.   Expected Discharge Date:  06/03/15               Expected Discharge Plan:  Home w Home Health Services  In-House Referral:  NA  Discharge planning Services  CM Consult  Post Acute Care Choice:  Home Health Choice offered to:  Patient  DME Arranged:  N/A DME Agency:  NA  HH Arranged:  PT HH Agency:  [facility]  Status of Service:  Completed, signed off  Medicare Important Message Given:    Date Medicare IM Given:    Medicare IM give by:    Date Additional Medicare IM Given:    Additional Medicare Important Message give by:     If discussed at Long Length of Stay Meetings, dates discussed:    Additional Comments:  Alexis Goodell, RN 06/04/2015, 9:45 AM [phone]

## 2015-06-04 NOTE — Progress Notes (Signed)
Discussed discharge orders with patient and wife until all questions anwsered. Patient states he has walker and all needed Dme. HH arranged with [facility].. Physical therapist states they have finished with today's therapy and patient is ready for discharge. Discharged via wheel chair with wife.

## 2015-06-08 ENCOUNTER — Ambulatory Visit (HOSPITAL_COMMUNITY)
Admission: RE | Admit: 2015-06-08 | Discharge: 2015-06-08 | Disposition: A | Discharge status: Home / Self Care | Payer: 59 | Source: Ambulatory Visit | Attending: Orthopaedic Surgery | Admitting: Orthopaedic Surgery

## 2015-06-08 ENCOUNTER — Other Ambulatory Visit (HOSPITAL_COMMUNITY): Payer: Self-pay | Admitting: Orthopaedic Surgery

## 2015-06-08 DIAGNOSIS — M79661 Pain in right lower leg: Secondary | ICD-10-CM | POA: Diagnosis present

## 2015-06-08 DIAGNOSIS — Z96651 Presence of right artificial knee joint: Secondary | ICD-10-CM | POA: Insufficient documentation

## 2015-06-08 DIAGNOSIS — R938 Abnormal findings on diagnostic imaging of other specified body structures: Secondary | ICD-10-CM | POA: Insufficient documentation

## 2015-06-08 DIAGNOSIS — R52 Pain, unspecified: Secondary | ICD-10-CM | POA: Diagnosis not present

## 2015-06-08 DIAGNOSIS — M7989 Other specified soft tissue disorders: Secondary | ICD-10-CM | POA: Insufficient documentation

## 2015-06-08 NOTE — Progress Notes (Signed)
VASCULAR LAB PRELIMINARY  PRELIMINARY  PRELIMINARY  PRELIMINARY  Bilateral lower extremity venous duplex completed.    Preliminary report:  Right:  DVT noted in the posterior tibial vein ankle to proximal calf .  No evidence of superficial thrombosis.  No Baker's cyst. Left:  No evidence of DVT, superficial thrombosis, or Baker's cyst.  Chevon Laufer, RVS 06/08/2015, 5:35 PM

## 2015-06-11 ENCOUNTER — Ambulatory Visit (HOSPITAL_COMMUNITY): Payer: 59

## 2016-03-25 ENCOUNTER — Other Ambulatory Visit: Payer: Self-pay | Admitting: Internal Medicine

## 2016-03-25 ENCOUNTER — Ambulatory Visit
Admission: RE | Admit: 2016-03-25 | Discharge: 2016-03-25 | Disposition: A | Discharge status: Home / Self Care | Payer: 59 | Source: Ambulatory Visit | Attending: Internal Medicine | Admitting: Internal Medicine

## 2016-03-25 DIAGNOSIS — R519 Headache, unspecified: Secondary | ICD-10-CM

## 2016-03-25 DIAGNOSIS — R51 Headache: Principal | ICD-10-CM

## 2016-04-08 ENCOUNTER — Encounter (INDEPENDENT_AMBULATORY_CARE_PROVIDER_SITE_OTHER): Payer: Self-pay

## 2016-04-08 ENCOUNTER — Ambulatory Visit (INDEPENDENT_AMBULATORY_CARE_PROVIDER_SITE_OTHER): Payer: Self-pay | Admitting: Orthopaedic Surgery

## 2016-04-08 ENCOUNTER — Ambulatory Visit (INDEPENDENT_AMBULATORY_CARE_PROVIDER_SITE_OTHER): Payer: 59 | Admitting: Physician Assistant

## 2016-04-08 DIAGNOSIS — Z96651 Presence of right artificial knee joint: Secondary | ICD-10-CM

## 2016-04-08 NOTE — Progress Notes (Signed)
Office Visit Note   Patient: Christopher Yu           Date of Birth: [birth year]           MRN: [id number] Visit Date: 04/08/2016              Requested by: Kirby Funk, MD [address] [place], [place] [address] PCP: Lillia Mountain, MD   Assessment & Plan: Visit Diagnoses:  1. History of total knee arthroplasty, right     Plan: Quad strengthening exercises are reviewed with the patient. Reassurance the clicking should resolve with strengthening is given. I'll up when necessary basis or if he has pain or any questions or concerns.  Follow-Up Instructions: Return if symptoms worsen or fail to improve.   Orders:  No orders of the defined types were placed in this encounter.  No orders of the defined types were placed in this encounter.     Procedures: No procedures performed   Clinical Data: No additional findings.   Subjective: Chief Complaint  Patient presents with  . Right Knee - Follow-up    Patient states he's doing well. Good ROM and strength,     HPI  Review of Systems   Objective: Vital Signs: There were no vitals taken for this visit.  Physical Exam  Constitutional: He is oriented to person, place, and time. He appears well-developed and well-nourished. He appears distressed.  Eyes: EOM are normal.  Pulmonary/Chest: Effort normal.  Neurological: He is alert and oriented to person, place, and time.  Skin: Skin is warm and dry. He is not diaphoretic.  Surgical incision right knee well healed  Psychiatric: He has a normal mood and affect.    Ortho Exam Excellent range of motion of the right knee. No instability valgus varus stressing of the right knee. Right knee with slight click with passive range of motion under the patella. Underdeveloped VMO on the right compared to the left VMO. Right calf supple nontender.  Specialty Comments:  No specialty comments available.  Imaging: No results found. No radiographs were  obtained today patient had films in August 2017  PMFS History: Patient Active Problem List   Diagnosis Date Noted  . Rheumatoid arthritis involving right knee (HCC) 06/01/2015  . Status post total right knee replacement 06/01/2015  . Cardiomyopathy (HCC) 05/02/2015  . Hyperlipidemia   . Sleep apnea in adult   . Rheumatoid arthritis (HCC)   . Chronic systolic heart failure (HCC) 05/01/2015  . CAD (coronary artery disease), native coronary artery    Past Medical History:  Diagnosis Date  . Arthritis, rheumatoid (HCC)    dx 1988  . Cardiomyopathy (HCC)   . Rheumatoid aortitis   . Sleep apnea    does not use cpap - UNABLE TO TOLERATE MASK    No family history on file.  Past Surgical History:  Procedure Laterality Date  . ANKLE FUSION Right 2014   related to his arthritis  . ANKLE FUSION Left 05/25/13   related to his arthritis  . CARDIAC CATHETERIZATION N/A 05/01/2015   Procedure: Left Heart Cath and Coronary Angiography;  Surgeon: Lyn Records, MD;  Location: [facility] INVASIVE CV LAB;  Service: Cardiovascular;  Laterality: N/A;  . FRACTURE SURGERY     left femur fracture x 3  . KNEE ARTHROSCOPY     right x 4  . NASAL SEPTOPLASTY W/ TURBINOPLASTY  06/25/2011   Procedure: NASAL SEPTOPLASTY WITH TURBINATE REDUCTION;  Surgeon: Osborn Coho, MD;  Location: [facility] OR;  Service: ENT;  Laterality: Bilateral;  . TONSILLECTOMY    . TOTAL KNEE ARTHROPLASTY Right 06/01/2015   Procedure: RIGHT TOTAL KNEE ARTHROPLASTY;  Surgeon: Kathryne Hitch, MD;  Location: [facility] ORS;  Service: Orthopedics;  Laterality: Right;  Block+general   Social History   Occupational History  . Not on file.   Social History Main Topics  . Smoking status: Former Smoker    Packs/day: 0.50    Years: 20.00    Quit date: 05/24/2008  . Smokeless tobacco: Not on file     Comment: stopped smoking 8 yrs ago.  . Alcohol use Yes     Comment: occaision monthly  . Drug use: No  . Sexual activity: Not on file

## 2016-05-26 DIAGNOSIS — E785 Hyperlipidemia, unspecified: Secondary | ICD-10-CM | POA: Diagnosis not present

## 2016-05-26 DIAGNOSIS — I429 Cardiomyopathy, unspecified: Secondary | ICD-10-CM | POA: Diagnosis not present

## 2016-05-26 DIAGNOSIS — I2510 Atherosclerotic heart disease of native coronary artery without angina pectoris: Secondary | ICD-10-CM | POA: Diagnosis not present

## 2016-06-13 DIAGNOSIS — M0579 Rheumatoid arthritis with rheumatoid factor of multiple sites without organ or systems involvement: Secondary | ICD-10-CM | POA: Diagnosis not present

## 2016-06-13 DIAGNOSIS — Z79899 Other long term (current) drug therapy: Secondary | ICD-10-CM | POA: Diagnosis not present

## 2016-06-27 DIAGNOSIS — M0579 Rheumatoid arthritis with rheumatoid factor of multiple sites without organ or systems involvement: Secondary | ICD-10-CM | POA: Diagnosis not present

## 2016-10-16 DIAGNOSIS — Z79899 Other long term (current) drug therapy: Secondary | ICD-10-CM | POA: Diagnosis not present

## 2016-10-16 DIAGNOSIS — M0579 Rheumatoid arthritis with rheumatoid factor of multiple sites without organ or systems involvement: Secondary | ICD-10-CM | POA: Diagnosis not present

## 2016-10-22 DIAGNOSIS — Z79899 Other long term (current) drug therapy: Secondary | ICD-10-CM | POA: Diagnosis not present

## 2016-10-22 DIAGNOSIS — M19041 Primary osteoarthritis, right hand: Secondary | ICD-10-CM | POA: Diagnosis not present

## 2016-10-22 DIAGNOSIS — M0579 Rheumatoid arthritis with rheumatoid factor of multiple sites without organ or systems involvement: Secondary | ICD-10-CM | POA: Diagnosis not present

## 2016-11-21 DIAGNOSIS — M0579 Rheumatoid arthritis with rheumatoid factor of multiple sites without organ or systems involvement: Secondary | ICD-10-CM | POA: Diagnosis not present

## 2016-11-21 DIAGNOSIS — Z79899 Other long term (current) drug therapy: Secondary | ICD-10-CM | POA: Diagnosis not present

## 2016-12-01 DIAGNOSIS — M0589 Other rheumatoid arthritis with rheumatoid factor of multiple sites: Secondary | ICD-10-CM | POA: Diagnosis not present

## 2016-12-01 DIAGNOSIS — I429 Cardiomyopathy, unspecified: Secondary | ICD-10-CM | POA: Diagnosis not present

## 2016-12-01 DIAGNOSIS — I2510 Atherosclerotic heart disease of native coronary artery without angina pectoris: Secondary | ICD-10-CM | POA: Diagnosis not present

## 2016-12-04 DIAGNOSIS — Z0000 Encounter for general adult medical examination without abnormal findings: Secondary | ICD-10-CM | POA: Diagnosis not present

## 2016-12-04 DIAGNOSIS — M069 Rheumatoid arthritis, unspecified: Secondary | ICD-10-CM | POA: Diagnosis not present

## 2016-12-04 DIAGNOSIS — E7800 Pure hypercholesterolemia, unspecified: Secondary | ICD-10-CM | POA: Diagnosis not present

## 2016-12-04 DIAGNOSIS — I427 Cardiomyopathy due to drug and external agent: Secondary | ICD-10-CM | POA: Diagnosis not present

## 2016-12-04 DIAGNOSIS — Z125 Encounter for screening for malignant neoplasm of prostate: Secondary | ICD-10-CM | POA: Diagnosis not present

## 2016-12-05 DIAGNOSIS — Z79899 Other long term (current) drug therapy: Secondary | ICD-10-CM | POA: Diagnosis not present

## 2016-12-05 DIAGNOSIS — M0579 Rheumatoid arthritis with rheumatoid factor of multiple sites without organ or systems involvement: Secondary | ICD-10-CM | POA: Diagnosis not present

## 2017-02-12 DIAGNOSIS — H353122 Nonexudative age-related macular degeneration, left eye, intermediate dry stage: Secondary | ICD-10-CM | POA: Diagnosis not present

## 2017-04-03 DIAGNOSIS — M0579 Rheumatoid arthritis with rheumatoid factor of multiple sites without organ or systems involvement: Secondary | ICD-10-CM | POA: Diagnosis not present

## 2017-04-23 DIAGNOSIS — M0579 Rheumatoid arthritis with rheumatoid factor of multiple sites without organ or systems involvement: Secondary | ICD-10-CM | POA: Diagnosis not present

## 2017-04-23 DIAGNOSIS — M479 Spondylosis, unspecified: Secondary | ICD-10-CM | POA: Diagnosis not present

## 2017-04-23 DIAGNOSIS — Z79899 Other long term (current) drug therapy: Secondary | ICD-10-CM | POA: Diagnosis not present

## 2017-06-22 DIAGNOSIS — E7849 Other hyperlipidemia: Secondary | ICD-10-CM | POA: Diagnosis not present

## 2017-06-22 DIAGNOSIS — I428 Other cardiomyopathies: Secondary | ICD-10-CM | POA: Diagnosis not present

## 2017-06-22 DIAGNOSIS — I429 Cardiomyopathy, unspecified: Secondary | ICD-10-CM | POA: Diagnosis not present

## 2017-06-22 DIAGNOSIS — I2510 Atherosclerotic heart disease of native coronary artery without angina pectoris: Secondary | ICD-10-CM | POA: Diagnosis not present

## 2017-07-10 DIAGNOSIS — M0579 Rheumatoid arthritis with rheumatoid factor of multiple sites without organ or systems involvement: Secondary | ICD-10-CM | POA: Diagnosis not present

## 2017-09-24 DIAGNOSIS — M0579 Rheumatoid arthritis with rheumatoid factor of multiple sites without organ or systems involvement: Secondary | ICD-10-CM | POA: Diagnosis not present

## 2017-10-23 DIAGNOSIS — M0579 Rheumatoid arthritis with rheumatoid factor of multiple sites without organ or systems involvement: Secondary | ICD-10-CM | POA: Diagnosis not present

## 2017-10-23 DIAGNOSIS — G8929 Other chronic pain: Secondary | ICD-10-CM | POA: Diagnosis not present

## 2017-10-23 DIAGNOSIS — M25512 Pain in left shoulder: Secondary | ICD-10-CM | POA: Diagnosis not present

## 2017-10-23 DIAGNOSIS — M25511 Pain in right shoulder: Secondary | ICD-10-CM | POA: Diagnosis not present

## 2017-11-06 IMAGING — CR DG CHEST 2V
2 series · 2 of 2 positions shown · non-contrast
Comparison: PA and lateral chest x-ray October 23, 2010

PRE CLINICAL DATA:
Preprocedural examination prior to cardiac catheterization.

EXAM:
CHEST  2 VIEW

[w chest pa]
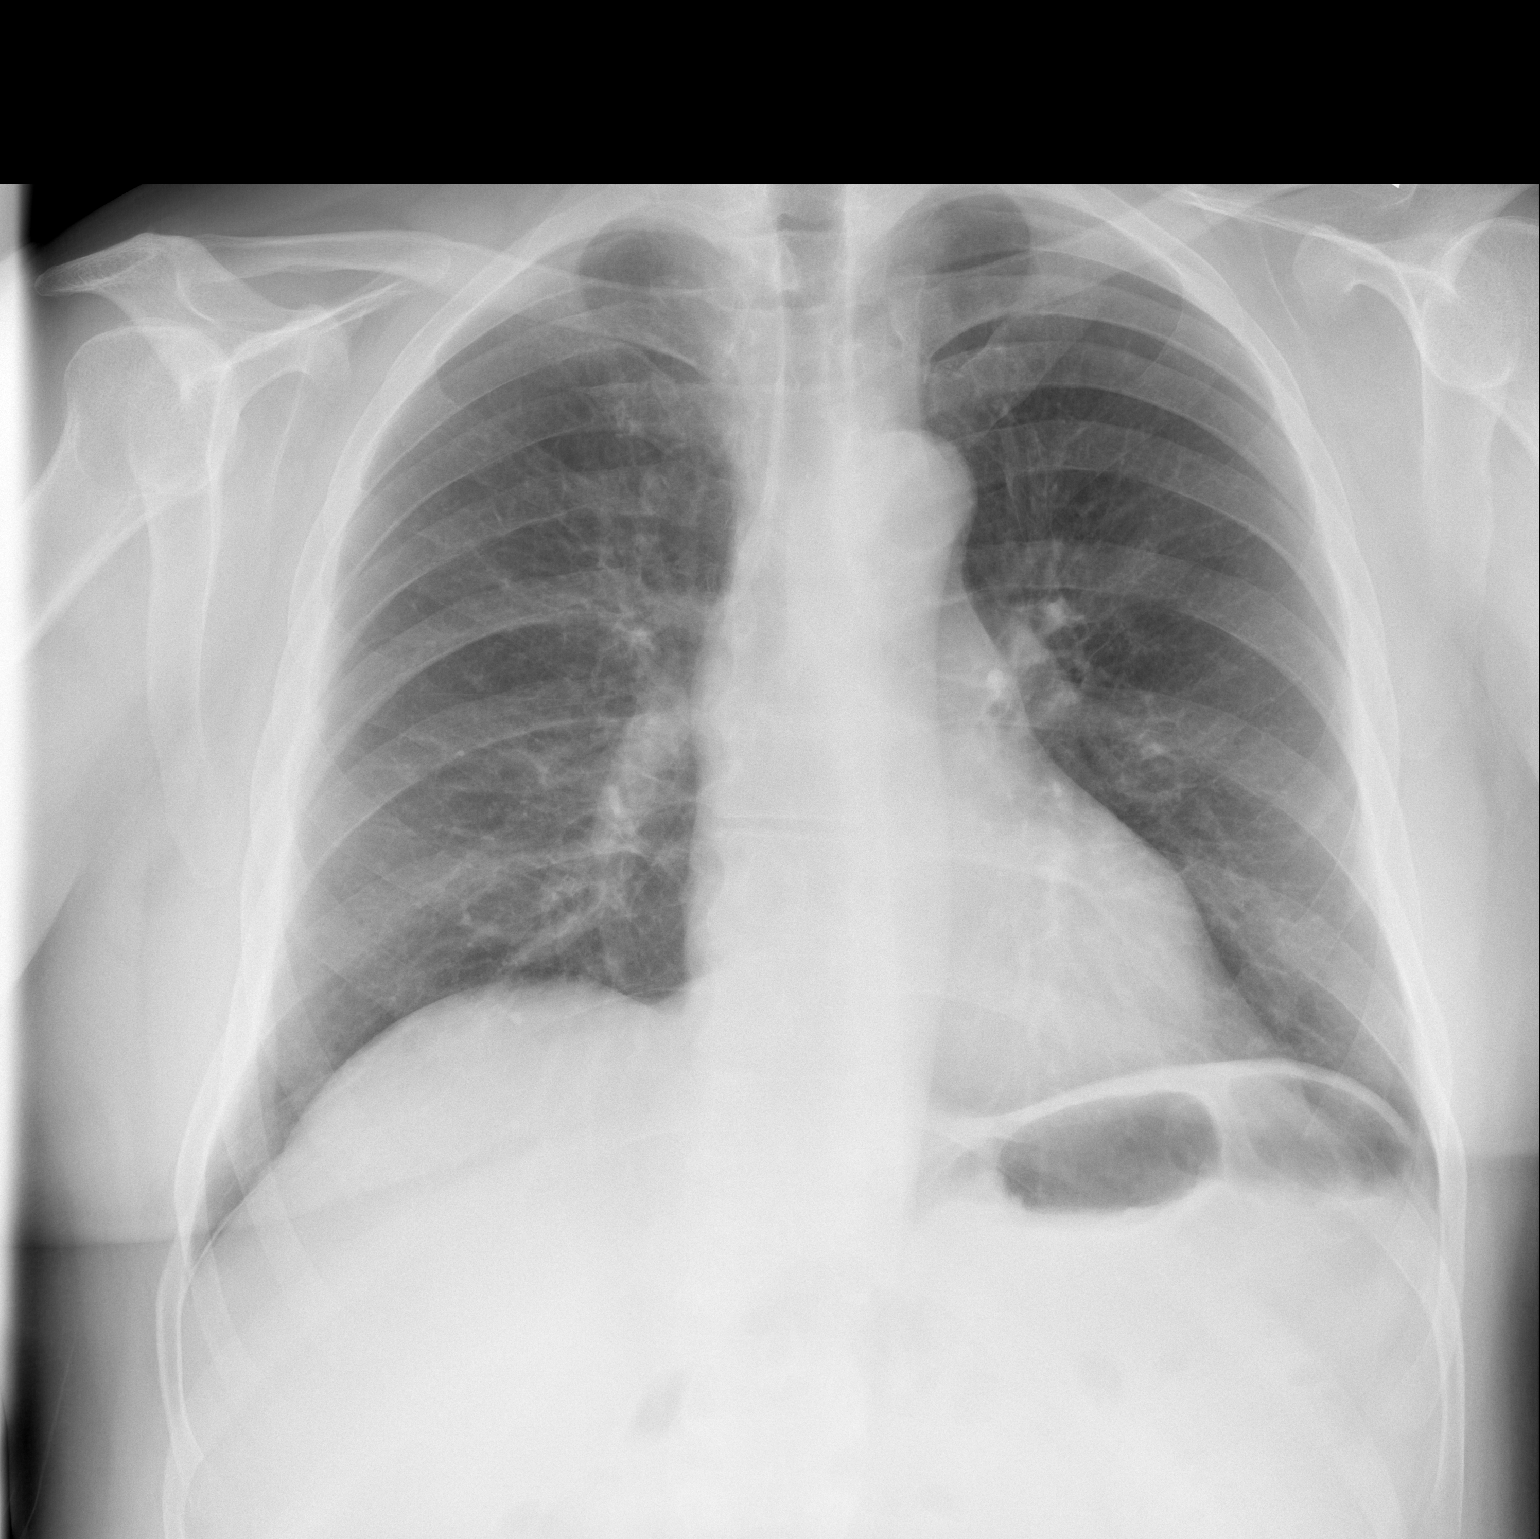

[w chest lat]
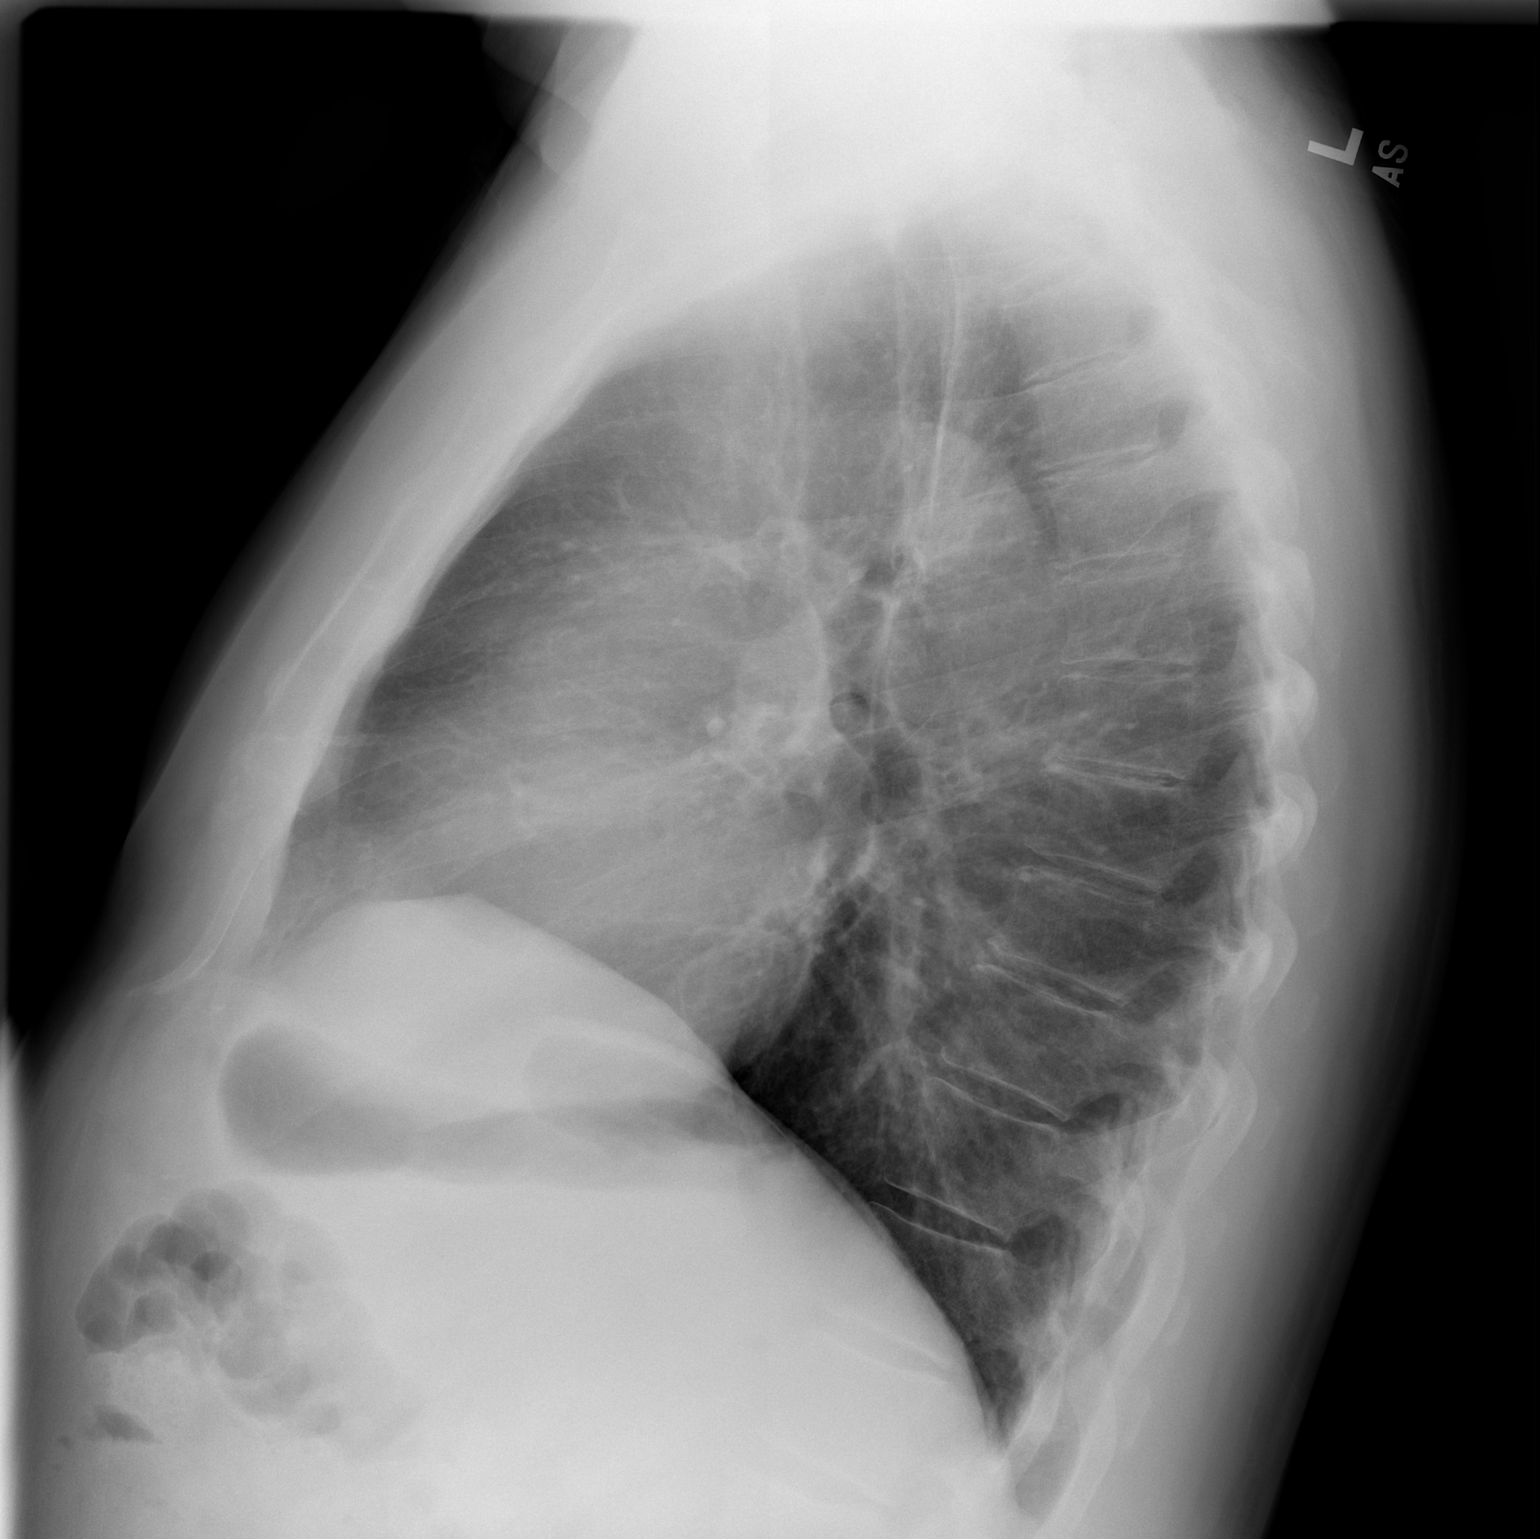

[2 of 2 positions shown; findings below may reference images not displayed]

FINDINGS: The lungs are adequately inflated. There is no focal infiltrate.
There is no pleural effusion. The heart and pulmonary vascularity
are normal. The mediastinum is normal in width. The bony thorax is
unremarkable.
IMPRESSION: There is no active cardiopulmonary disease.

## 2017-12-09 DIAGNOSIS — M069 Rheumatoid arthritis, unspecified: Secondary | ICD-10-CM | POA: Diagnosis not present

## 2017-12-09 DIAGNOSIS — Z0000 Encounter for general adult medical examination without abnormal findings: Secondary | ICD-10-CM | POA: Diagnosis not present

## 2017-12-09 DIAGNOSIS — E7800 Pure hypercholesterolemia, unspecified: Secondary | ICD-10-CM | POA: Diagnosis not present

## 2017-12-09 DIAGNOSIS — I427 Cardiomyopathy due to drug and external agent: Secondary | ICD-10-CM | POA: Diagnosis not present

## 2017-12-21 DIAGNOSIS — I429 Cardiomyopathy, unspecified: Secondary | ICD-10-CM | POA: Diagnosis not present

## 2017-12-21 DIAGNOSIS — I2510 Atherosclerotic heart disease of native coronary artery without angina pectoris: Secondary | ICD-10-CM | POA: Diagnosis not present

## 2017-12-21 DIAGNOSIS — E785 Hyperlipidemia, unspecified: Secondary | ICD-10-CM | POA: Diagnosis not present

## 2017-12-22 ENCOUNTER — Ambulatory Visit (INDEPENDENT_AMBULATORY_CARE_PROVIDER_SITE_OTHER): Payer: 59

## 2017-12-22 ENCOUNTER — Ambulatory Visit (INDEPENDENT_AMBULATORY_CARE_PROVIDER_SITE_OTHER): Payer: 59 | Admitting: Orthopaedic Surgery

## 2017-12-22 ENCOUNTER — Encounter (INDEPENDENT_AMBULATORY_CARE_PROVIDER_SITE_OTHER): Payer: Self-pay | Admitting: Orthopaedic Surgery

## 2017-12-22 DIAGNOSIS — M25512 Pain in left shoulder: Secondary | ICD-10-CM

## 2017-12-22 DIAGNOSIS — M25511 Pain in right shoulder: Secondary | ICD-10-CM

## 2017-12-22 DIAGNOSIS — M7541 Impingement syndrome of right shoulder: Secondary | ICD-10-CM | POA: Diagnosis not present

## 2017-12-22 DIAGNOSIS — M7542 Impingement syndrome of left shoulder: Secondary | ICD-10-CM

## 2017-12-22 HISTORY — DX: Impingement syndrome of left shoulder: M75.42

## 2017-12-22 NOTE — Progress Notes (Signed)
Office Visit Note   Patient: Christopher Yu           Date of Birth: 07-31-1960           MRN: [id number] Visit Date: 12/22/2017              Requested by: Kirby Funk, MD [address] [place], [place] [address] PCP: Kirby Funk, MD   Assessment & Plan: Visit Diagnoses:  1. Acute pain of both shoulders   2. Impingement syndrome of left shoulder   3. Impingement syndrome of right shoulder     Plan: I do feel his diagnosis is shoulder impingement syndrome.  I showed him some sites on the Internet help in terms of decreasing his shoulder pain through exercise.  He will limit his overhead activities for now.  He will limit some of his working out activities.  Since his range of motion is normal not recommending therapy for now.  However he understands that this would be an option.  Also feel that he would benefit from repeat subacromial injections in about 2 months from now only if he is having pain.  All question concerns were answered and addressed.  Follow-up will be as needed.  Follow-Up Instructions: Return if symptoms worsen or fail to improve.   Orders:  Orders Placed This Encounter  Procedures  . XR Shoulder Right  . XR Shoulder Left   No orders of the defined types were placed in this encounter.     Procedures: No procedures performed   Clinical Data: No additional findings.   Subjective: Chief Complaint  Patient presents with  . Right Shoulder - Pain  . Left Shoulder - Pain  The patient comes in today with a history of bilateral shoulder pain left worse than right.  This is been going on for about a year now and is off and on in terms of the pain that it causes him.  He is an avid Teacher, English as a foreign language.  He does have rheumatoid disease.  He hates taking steroids but he had been on steroids recently and that helped calm things down.  He denies any specific injury.  It is intermittent when the pain occurs.  Does not hurt reaching overhead and behind and across  the front.  He denies any numbness and tingling in his hands denies any neck pain.  He denies any specific injuries to his shoulders.  It has been coming down recently but is still stressful to him having this type of pain but it does occur.  HPI  Review of Systems He currently denies any headache, chest pain, shortness of breath, fever, chills, nausea, vomiting.  Objective: Vital Signs: There were no vitals taken for this visit.  Physical Exam Is alert and oriented x3 and in no acute distress Ortho Exam Examination of both shoulder show excellent strength of the rotator cuff.  His range of motion is full in all planes.  He does have positive Neer and Hawkins signs.  A lot of his pain is around the [facility] joint and the subacromial outlet on both shoulders.  The left seems to be worse than right.   Specialty Comments:  No specialty comments available.  Imaging: Xr Shoulder Left  Result Date: 12/22/2017 3 views of the left shoulder show decreased in the subacromial outlet that is minimal.  Otherwise there are no acute findings.  The acromioclavicular joint has adequate space as does the glenohumeral joint.  The shoulder itself is not high riding.  Xr Shoulder Right  Result Date: 12/22/2017 3 views of the right shoulder show no acute findings.  There is adequate space of the subacromial outlet as well as the glenohumeral joint and the AC joint.    PMFS History: Patient Active Problem List   Diagnosis Date Noted  . Impingement syndrome of left shoulder 12/22/2017  . Impingement syndrome of right shoulder 12/22/2017  . Rheumatoid arthritis involving right knee (HCC) 06/01/2015  . Status post total right knee replacement 06/01/2015  . Cardiomyopathy (HCC) 05/02/2015  . Hyperlipidemia   . Sleep apnea in adult   . Rheumatoid arthritis (HCC)   . Chronic systolic heart failure (HCC) 05/01/2015  . CAD (coronary artery disease), native coronary artery    Past Medical History:  Diagnosis  Date  . Arthritis, rheumatoid (HCC)    dx 1988  . Cardiomyopathy (HCC)   . Rheumatoid aortitis   . Sleep apnea    does not use cpap - UNABLE TO TOLERATE MASK    History reviewed. No pertinent family history.  Past Surgical History:  Procedure Laterality Date  . ANKLE FUSION Right 2014   related to his arthritis  . ANKLE FUSION Left 05/25/13   related to his arthritis  . CARDIAC CATHETERIZATION N/A 05/01/2015   Procedure: Left Heart Cath and Coronary Angiography;  Surgeon: Lyn Records, MD;  Location: [facility] INVASIVE CV LAB;  Service: Cardiovascular;  Laterality: N/A;  . FRACTURE SURGERY     left femur fracture x 3  . KNEE ARTHROSCOPY     right x 4  . NASAL SEPTOPLASTY W/ TURBINOPLASTY  06/25/2011   Procedure: NASAL SEPTOPLASTY WITH TURBINATE REDUCTION;  Surgeon: Osborn Coho, MD;  Location: [facility] OR;  Service: ENT;  Laterality: Bilateral;  . TONSILLECTOMY    . TOTAL KNEE ARTHROPLASTY Right 06/01/2015   Procedure: RIGHT TOTAL KNEE ARTHROPLASTY;  Surgeon: Kathryne Hitch, MD;  Location: [facility] ORS;  Service: Orthopedics;  Laterality: Right;  Block+general   Social History   Occupational History  . Not on file  Tobacco Use  . Smoking status: Former Smoker    Packs/day: 0.50    Years: 20.00    Pack years: 10.00    Last attempt to quit: 05/24/2008    Years since quitting: 9.5  . Tobacco comment: stopped smoking 8 yrs ago.  Substance and Sexual Activity  . Alcohol use: Yes    Comment: occaision monthly  . Drug use: No  . Sexual activity: Not on file

## 2017-12-28 DIAGNOSIS — M0579 Rheumatoid arthritis with rheumatoid factor of multiple sites without organ or systems involvement: Secondary | ICD-10-CM | POA: Diagnosis not present

## 2017-12-28 DIAGNOSIS — Z79899 Other long term (current) drug therapy: Secondary | ICD-10-CM | POA: Diagnosis not present

## 2017-12-28 DIAGNOSIS — M8589 Other specified disorders of bone density and structure, multiple sites: Secondary | ICD-10-CM | POA: Diagnosis not present

## 2017-12-28 DIAGNOSIS — M810 Age-related osteoporosis without current pathological fracture: Secondary | ICD-10-CM | POA: Diagnosis not present

## 2018-03-03 ENCOUNTER — Other Ambulatory Visit: Payer: Self-pay | Admitting: Cardiology

## 2018-03-03 ENCOUNTER — Other Ambulatory Visit: Payer: Self-pay

## 2018-03-03 MED ORDER — METOPROLOL SUCCINATE ER 25 MG PO TB24: 25.0000 mg | ORAL_TABLET | Freq: Every day | ORAL | 4 refills | Status: DC

## 2018-03-03 NOTE — Telephone Encounter (Signed)
Refill sent for metoprolol succinate 25mg  one tablet daily.  Patient is due for follow up in Feb 2020.  Will notify front desk to call and scheduled appointment.

## 2018-03-03 NOTE — Telephone Encounter (Signed)
Metoprolol Succinate 25mg  1QD #30 LISINOPROL 10MG  1qd #30 [facility] [phone]

## 2018-03-15 DIAGNOSIS — H353132 Nonexudative age-related macular degeneration, bilateral, intermediate dry stage: Secondary | ICD-10-CM | POA: Diagnosis not present

## 2018-03-17 DIAGNOSIS — M79671 Pain in right foot: Secondary | ICD-10-CM | POA: Diagnosis not present

## 2018-03-17 DIAGNOSIS — M19071 Primary osteoarthritis, right ankle and foot: Secondary | ICD-10-CM | POA: Diagnosis not present

## 2018-03-17 DIAGNOSIS — M19041 Primary osteoarthritis, right hand: Secondary | ICD-10-CM | POA: Diagnosis not present

## 2018-03-17 DIAGNOSIS — Z79899 Other long term (current) drug therapy: Secondary | ICD-10-CM | POA: Diagnosis not present

## 2018-03-17 DIAGNOSIS — M79643 Pain in unspecified hand: Secondary | ICD-10-CM | POA: Diagnosis not present

## 2018-03-17 DIAGNOSIS — M0579 Rheumatoid arthritis with rheumatoid factor of multiple sites without organ or systems involvement: Secondary | ICD-10-CM | POA: Diagnosis not present

## 2018-03-17 DIAGNOSIS — M79642 Pain in left hand: Secondary | ICD-10-CM | POA: Diagnosis not present

## 2018-03-19 NOTE — Telephone Encounter (Signed)
Pharmacy states they did not get lisinopril refill, can this be resent today please?

## 2018-03-23 ENCOUNTER — Telehealth: Payer: Self-pay

## 2018-03-23 ENCOUNTER — Telehealth: Payer: Self-pay | Admitting: Cardiology

## 2018-03-23 MED ORDER — LISINOPRIL 10 MG PO TABS: 10.0000 mg | ORAL_TABLET | Freq: Every day | ORAL | 0 refills | Status: DC

## 2018-03-23 NOTE — Telephone Encounter (Signed)
Rx sent to pharmacy for Lisinopril 10mg  one tablet daily # 30 with no refills sent to pharmacy as requested.   Patient needs appointment for future refills.

## 2018-03-23 NOTE — Telephone Encounter (Signed)
Morrie Sheldon, Please call in this medicine. It was originally sent 10/30 but the pharmacy states they did not receive. Lisinopril 10mg  tablet [facility] [address] [facility] tablets. I put an addendum on Nick's phone note but this was not taken card of apparently.

## 2018-03-24 NOTE — Telephone Encounter (Signed)
Patient will need labs prior to refill being given, will request patient have these done tomorrow as I do not have their current contact info.

## 2018-03-24 NOTE — Telephone Encounter (Signed)
Given to Revankar for review.

## 2018-03-25 NOTE — Telephone Encounter (Signed)
Left message with the answering service for Tilley's office to have someone provide the patient's contact info.

## 2018-03-29 NOTE — Telephone Encounter (Signed)
Christopher Yu no call back yet.

## 2018-03-29 NOTE — Telephone Encounter (Signed)
Per Gunnar Fusi this med has been previously refilled. Patient phone number was added to the chart.

## 2018-03-29 NOTE — Telephone Encounter (Signed)
Christopher Yu, it looks like Christopher Yu already put a script in. One of Dr. York Spaniel CMA is here if you call here, I can put you thru to her

## 2018-04-14 DIAGNOSIS — M0579 Rheumatoid arthritis with rheumatoid factor of multiple sites without organ or systems involvement: Secondary | ICD-10-CM | POA: Diagnosis not present

## 2018-04-14 DIAGNOSIS — Z79899 Other long term (current) drug therapy: Secondary | ICD-10-CM | POA: Diagnosis not present

## 2018-04-14 DIAGNOSIS — M79643 Pain in unspecified hand: Secondary | ICD-10-CM | POA: Diagnosis not present

## 2018-05-10 DIAGNOSIS — M0579 Rheumatoid arthritis with rheumatoid factor of multiple sites without organ or systems involvement: Secondary | ICD-10-CM | POA: Diagnosis not present

## 2018-05-26 DIAGNOSIS — M0579 Rheumatoid arthritis with rheumatoid factor of multiple sites without organ or systems involvement: Secondary | ICD-10-CM | POA: Diagnosis not present

## 2018-06-11 ENCOUNTER — Telehealth: Payer: Self-pay | Admitting: *Deleted

## 2018-06-11 NOTE — Telephone Encounter (Signed)
REFERRAL SENT TO SCHEDULING AND NOTES ON FILE FROM DR. St[place] Leon GRIFFIN [phone]

## 2018-07-14 DIAGNOSIS — M79643 Pain in unspecified hand: Secondary | ICD-10-CM | POA: Diagnosis not present

## 2018-07-14 DIAGNOSIS — M0579 Rheumatoid arthritis with rheumatoid factor of multiple sites without organ or systems involvement: Secondary | ICD-10-CM | POA: Diagnosis not present

## 2018-07-14 DIAGNOSIS — Z79899 Other long term (current) drug therapy: Secondary | ICD-10-CM | POA: Diagnosis not present

## 2018-08-15 ENCOUNTER — Other Ambulatory Visit: Payer: Self-pay | Admitting: Cardiology

## 2018-08-16 ENCOUNTER — Telehealth: Payer: Self-pay

## 2018-08-16 NOTE — Telephone Encounter (Signed)
Called pt to set up possible evisit, left voicemail asking pt to call the office.

## 2018-08-16 NOTE — Telephone Encounter (Signed)
Virtual Visit Pre-Appointment Phone Call  Steps For Call:  1. Confirm consent - "In the setting of the current Covid19 crisis, you are scheduled for a (phone or video) visit with your provider on (date) at (time).  Just as we do with many in-office visits, in order for you to participate in this visit, we must obtain consent.  If you'd like, I can send this to your mychart (if signed up) or email for you to review.  Otherwise, I can obtain your verbal consent now.  All virtual visits are billed to your insurance company just like a normal visit would be.  By agreeing to a virtual visit, we'd like you to understand that the technology does not allow for your provider to perform an examination, and thus may limit your provider's ability to fully assess your condition.  Finally, though the technology is pretty good, we cannot assure that it will always work on either your or our end, and in the setting of a video visit, we may have to convert it to a phone-only visit.  In either situation, we cannot ensure that we have a secure connection.  Are you willing to proceed?"  2. Give patient instructions for WebEx download to smartphone as below if video visit  3. Advise patient to be prepared with any vital sign or heart rhythm information, their current medicines, and a piece of paper and pen handy for any instructions they may receive the day of their visit  4. Inform patient they will receive a phone call 15 minutes prior to their appointment time (may be from unknown caller ID) so they should be prepared to answer  5. Confirm that appointment type is correct in Epic appointment notes (video vs telephone)    TELEPHONE CALL NOTE  Christopher Yu has been deemed a candidate for a follow-up tele-health visit to limit community exposure during the Covid-19 pandemic. I spoke with the patient via phone to ensure availability of phone/video source, confirm preferred email & phone number, and  discuss instructions and expectations.  I reminded Christopher Yu to be prepared with any vital sign and/or heart rhythm information that could potentially be obtained via home monitoring, at the time of his visit. I reminded Christopher Yu to expect a phone call at the time of his visit if his visit.  Did the patient verbally acknowledge consent to treatment? yes  Adele Schilder, CMA 08/16/2018 1:36 PM   DOWNLOADING THE WEBEX SOFTWARE TO SMARTPHONE  - If Apple, go to [employer] and type in WebEx in the search bar. Download [employer], the blue/green circle. The app is free but as with any other app downloads, their phone may require them to verify saved payment information or Apple password. The patient does NOT have to create an account.  - If Android, ask patient to go to [employer] and type in WebEx in the search bar. Download [employer], the blue/green circle. The app is free but as with any other app downloads, their phone may require them to verify saved payment information or Android password. The patient does NOT have to create an account.   CONSENT FOR TELE-HEALTH VISIT - PLEASE REVIEW  I hereby voluntarily request, consent and authorize [facility] and its employed or contracted physicians, physician assistants, nurse practitioners or other licensed health care professionals (the Practitioner), to provide me with telemedicine health care services (the "Services") as deemed necessary by the treating Practitioner.  I acknowledge and consent to receive the Services by the Practitioner via telemedicine. I understand that the telemedicine visit will involve communicating with the Practitioner through live audiovisual communication technology and the disclosure of certain medical information by electronic transmission. I acknowledge that I have been given the opportunity to request an in-person assessment or other available alternative  prior to the telemedicine visit and am voluntarily participating in the telemedicine visit.  I understand that I have the right to withhold or withdraw my consent to the use of telemedicine in the course of my care at any time, without affecting my right to future care or treatment, and that the Practitioner or I may terminate the telemedicine visit at any time. I understand that I have the right to inspect all information obtained and/or recorded in the course of the telemedicine visit and may receive copies of available information for a reasonable fee.  I understand that some of the potential risks of receiving the Services via telemedicine include:  Marland Kitchen Delay or interruption in medical evaluation due to technological equipment failure or disruption; . Information transmitted may not be sufficient (e.g. poor resolution of images) to allow for appropriate medical decision making by the Practitioner; and/or  . In rare instances, security protocols could fail, causing a breach of personal health information.  Furthermore, I acknowledge that it is my responsibility to provide information about my medical history, conditions and care that is complete and accurate to the best of my ability. I acknowledge that Practitioner's advice, recommendations, and/or decision may be based on factors not within their control, such as incomplete or inaccurate data provided by me or distortions of diagnostic images or specimens that may result from electronic transmissions. I understand that the practice of medicine is not an exact science and that Practitioner makes no warranties or guarantees regarding treatment outcomes. I acknowledge that I will receive a copy of this consent concurrently upon execution via email to the email address I last provided but may also request a printed copy by calling the office of [facility].    I understand that my insurance will be billed for this visit.   I have read or had this  consent read to me. . I understand the contents of this consent, which adequately explains the benefits and risks of the Services being provided via telemedicine.  . I have been provided ample opportunity to ask questions regarding this consent and the Services and have had my questions answered to my satisfaction. . I give my informed consent for the services to be provided through the use of telemedicine in my medical care  By participating in this telemedicine visit I agree to the above.

## 2018-08-16 NOTE — Telephone Encounter (Signed)
F/U Message           Patient is retuning "Lucy's call, pls call again.

## 2018-08-20 ENCOUNTER — Other Ambulatory Visit: Payer: Self-pay

## 2018-08-20 ENCOUNTER — Encounter: Payer: Self-pay | Admitting: Interventional Cardiology

## 2018-08-20 ENCOUNTER — Telehealth (INDEPENDENT_AMBULATORY_CARE_PROVIDER_SITE_OTHER): Payer: 59 | Admitting: Interventional Cardiology

## 2018-08-20 DIAGNOSIS — I5022 Chronic systolic (congestive) heart failure: Secondary | ICD-10-CM

## 2018-08-20 DIAGNOSIS — I428 Other cardiomyopathies: Secondary | ICD-10-CM

## 2018-08-20 DIAGNOSIS — E782 Mixed hyperlipidemia: Secondary | ICD-10-CM

## 2018-08-20 DIAGNOSIS — I2510 Atherosclerotic heart disease of native coronary artery without angina pectoris: Secondary | ICD-10-CM | POA: Diagnosis not present

## 2018-08-20 MED ORDER — SPIRONOLACTONE 25 MG PO TABS: 25.0000 mg | ORAL_TABLET | Freq: Every day | ORAL | 3 refills | Status: DC

## 2018-08-20 MED ORDER — LISINOPRIL 10 MG PO TABS: 10.0000 mg | ORAL_TABLET | Freq: Every day | ORAL | 3 refills | Status: DC

## 2018-08-20 MED ORDER — METOPROLOL SUCCINATE ER 25 MG PO TB24: 25.0000 mg | ORAL_TABLET | Freq: Every day | ORAL | 3 refills | Status: DC

## 2018-08-20 NOTE — Patient Instructions (Signed)

## 2018-08-20 NOTE — Progress Notes (Signed)
Virtual Visit via Video Note   This visit type was conducted due to national recommendations for restrictions regarding the COVID-19 Pandemic (e.g. social distancing) in an effort to limit this patient's exposure and mitigate transmission in our community.  Due to his co-morbid illnesses, this patient is at least at moderate risk for complications without adequate follow up.  This format is felt to be most appropriate for this patient at this time.  All issues noted in this document were discussed and addressed.  A limited physical exam was performed with this format.  Please refer to the patient's chart for his consent to telehealth for [facility].   Evaluation Performed:  Follow-up visit  Date:  08/20/2018   ID:  Christopher Yu, DOB May 03, 1961, MRN [id number]  Patient Location: Home Provider Location: Home  PCP:  Christopher FunkGriffin, John, MD  Cardiologist:  No primary care provider on file. Christopher Yu Electrophysiologist:  None   Chief Complaint: Cardiomyopathy  History of Present Illness:    Christopher Yu is a 58 y.o. male with a history of rheumatoid arthritis and chronic systolic heart failure.  Ejection fraction has been documented in the 35 to 40% range in the past.  He has had stress testing done in the past but no cardiac catheterization.  He does carry a diagnosis of coronary artery disease on his problem list from Dr. Donnie Yu.  ECho in 2016.  Stress test in 2016.  Cath in 2016 was done and there was no significant blockage.   He has been on medical therapy for his cardiomyopathy for several years.  The patient does not have symptoms concerning for COVID-19 infection (fever, chills, cough, or new shortness of breath).   Trying to exercise during the quarantine.  Golf has been best exercise for him.    Denies : Chest pain. Dizziness. Leg edema. Nitroglycerin use. Orthopnea. Palpitations. Paroxysmal nocturnal dyspnea. Shortness of breath. Syncope.    Past  Medical History:  Diagnosis Date  . Arthritis, rheumatoid (HCC)    dx 1988  . CAD (coronary artery disease), native coronary artery    Mild LAD disease with calcification noted at cath 12//27/16   . Cardiomyopathy (HCC)   . Chronic systolic heart failure (HCC) 05/01/2015  . Deviated nasal septum 06/25/2011  . Hyperlipidemia   . Impingement syndrome of left shoulder 12/22/2017  . Rheumatoid aortitis   . Sleep apnea    does not use cpap - UNABLE TO TOLERATE MASK  . Sleep apnea in adult   . Status post total right knee replacement 06/01/2015   Past Surgical History:  Procedure Laterality Date  . ANKLE FUSION Right 2014   related to his arthritis  . ANKLE FUSION Left 05/25/13   related to his arthritis  . CARDIAC CATHETERIZATION N/A 05/01/2015   Procedure: Left Heart Cath and Coronary Angiography;  Surgeon: Christopher RecordsHenry W Smith, MD;  [facility];  Service: Cardiovascular;  Laterality: N/A;  . FRACTURE SURGERY     left femur fracture x 3  . KNEE ARTHROSCOPY     right x 4  . NASAL SEPTOPLASTY W/ TURBINOPLASTY  06/25/2011   Procedure: NASAL SEPTOPLASTY WITH TURBINATE REDUCTION;  Surgeon: Christopher Cohoavid Shoemaker, MD;  [facility];  Service: ENT;  Laterality: Bilateral;  . TONSILLECTOMY    . TOTAL KNEE ARTHROPLASTY Right 06/01/2015   Procedure: RIGHT TOTAL KNEE ARTHROPLASTY;  Surgeon: Christopher Hitchhristopher Y Blackman, MD;  Location: [facility] ORS;  Service: Orthopedics;  Laterality: Right;  Block+general  Current Meds  Medication Sig  . aspirin 81 MG tablet Take 81 mg by mouth daily.  . folic acid (FOLVITE) 1 MG tablet Take 1 mg by mouth daily.  Marland Kitchen leflunomide (ARAVA) 10 MG tablet Take 20 mg by mouth daily.   Marland Kitchen lisinopril (PRINIVIL,ZESTRIL) 10 MG tablet Take 1 tablet (10 mg total) by mouth daily. Please call [phone] to make follow up appointment for future refills.  . methotrexate (RHEUMATREX) 2.5 MG tablet Take 20 mg by mouth once a week. Takes 8 tablets(20mg ) every Saturday.      Caution:Chemotherapy. Protect from light.  . metoprolol succinate (TOPROL-XL) 25 MG 24 hr tablet Take 1 tablet (25 mg total) by mouth daily.  . Multiple Vitamins-Minerals (PRESERVISION AREDS PO) Take 2 capsules by mouth daily.  Marland Kitchen spironolactone (ALDACTONE) 25 MG tablet Take 25 mg by mouth daily.  Marland Kitchen sulfaSALAzine (AZULFIDINE) 500 MG tablet Take 1,000 mg by mouth 2 (two) times daily.   . traMADol (ULTRAM) 50 MG tablet Take 50 mg by mouth as needed.     Allergies:   Patient has no known allergies.   Social History   Tobacco Use  . Smoking status: Former Smoker    Packs/day: 0.50    Years: 20.00    Pack years: 10.00    Last attempt to quit: 05/24/2008    Years since quitting: 10.2  . Tobacco comment: stopped smoking 8 yrs ago.  Substance Use Topics  . Alcohol use: Yes    Comment: occaision monthly  . Drug use: No     Family Hx: The patient's family history includes Lupus in his sister.  ROS:   Please see the history of present illness.    Occasional knee pain All other systems reviewed and are negative.   Prior CV studies:   The following studies were reviewed today:  Prior cath reviewed  Labs/Other Tests and Data Reviewed:    EKG:  An ECG dated 12/16 was personally reviewed today and demonstrated:  NSR, lateral T wave inversions  Recent Labs: No results found for requested labs within last 8760 hours.   Recent Lipid Panel No results found for: CHOL, TRIG, HDL, CHOLHDL, LDLCALC, LDLDIRECT  Wt Readings from Last 3 Encounters:  08/20/18 243 lb (110.2 kg)  06/01/15 242 lb (109.8 kg)  05/25/15 245 lb (111.1 kg)     Objective:    Vital Signs:  BP 130/78   Pulse (!) 56   Ht 6\' 2"  (1.88 m)   Wt 243 lb (110.2 kg)   BMI 31.20 kg/m    VITAL SIGNS:  reviewed GEN:  no acute distress RESPIRATORY:  no obvious Shortness of breath exam limited by video format  ASSESSMENT & PLAN:    1. Chronic systolic heart failure: Appears euvolemic.  COntinue current meds.  Will  call in refills. 2. Hyperlipidemia: LDL 140 in 8/19.  Will see if he is candidate  For bempedoic acid. 3. Obesity: Trying to lose weight with exercise and diet. Decreasing sugar intake. 4. Coronary calcification: No significant obstructive disease.  COVID-19 Education: The signs and symptoms of COVID-19 were discussed with the patient and how to seek care for testing (follow up with PCP or arrange E-visit).  The importance of social distancing was discussed today.  Time:   Today, I have spent 25 minutes with the patient with telehealth technology discussing the above problems.     Medication Adjustments/Labs and Tests Ordered: Current medicines are reviewed at length with the patient today.  Concerns regarding  medicines are outlined above.    Tests Ordered: No orders of the defined types were placed in this encounter.   Medication Changes: No orders of the defined types were placed in this encounter.   Disposition:  Follow up in 6 month(s)  Signed, Lance Muss, MD  08/20/2018 10:16 AM    [facility]

## 2018-10-05 IMAGING — CT CT HEAD W/O CM
3 of 4 series · 16 of 47 positions shown, 19 images · non-contrast
Comparison: Brain MRI 08/30/2008.

CLINICAL DATA: 55-year-old male with posterior middle and right
headache for 6 days. No known injury. Initial encounter.

EXAM:
CT HEAD WITHOUT CONTRAST
TECHNIQUE: Contiguous axial images were obtained from the base of the skull
through the vertex without intravenous contrast.

[Series 34: 3d filtered head w/o · axial · non-contrast · 0.49mm/px · z∈[-162,-12]mm · 10 of 36 slices shown, 13 images]
[im 3/36  brain]
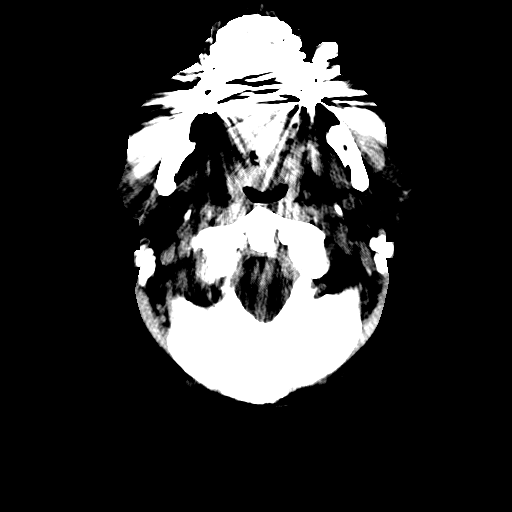
[im 3/36  bone]
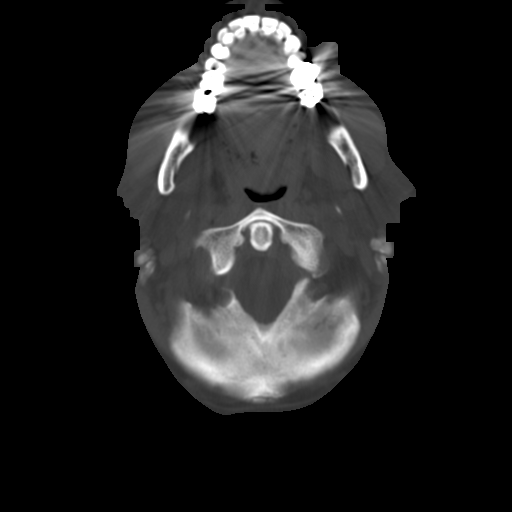
[im 6/36  brain]
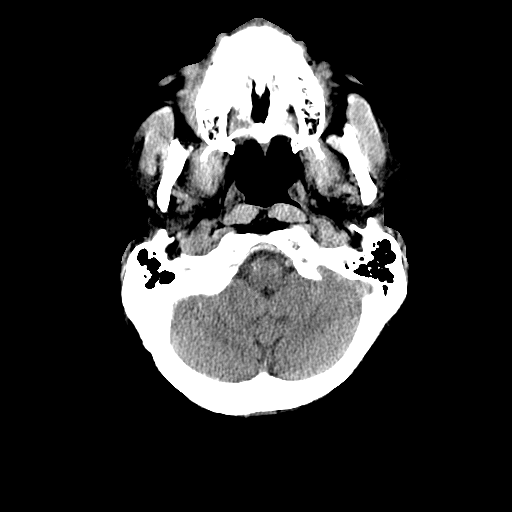
[im 11/36  brain]
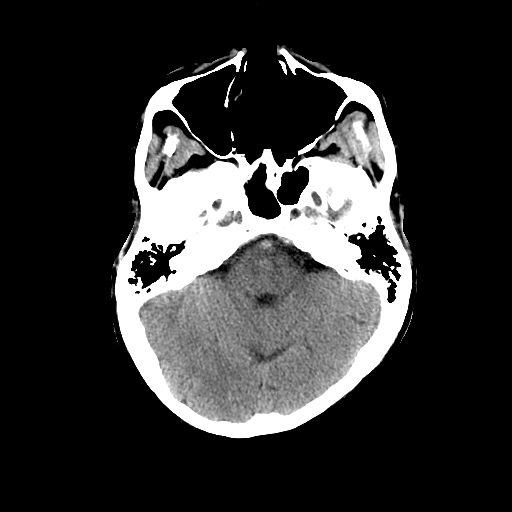
[im 13/36  brain]
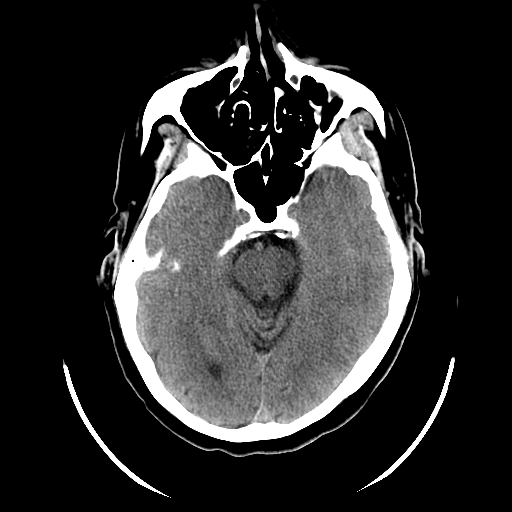
[im 16/36  brain]
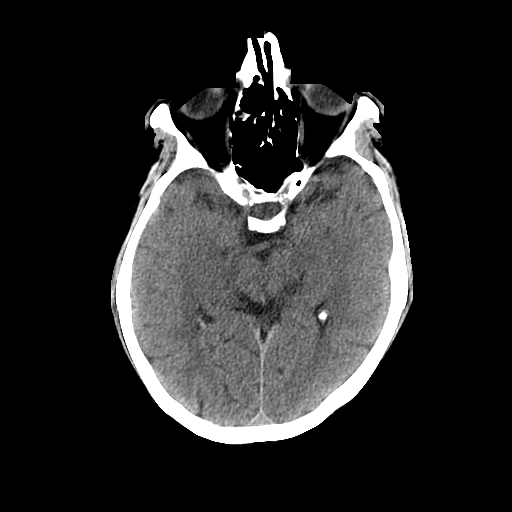
[im 16/36  bone]
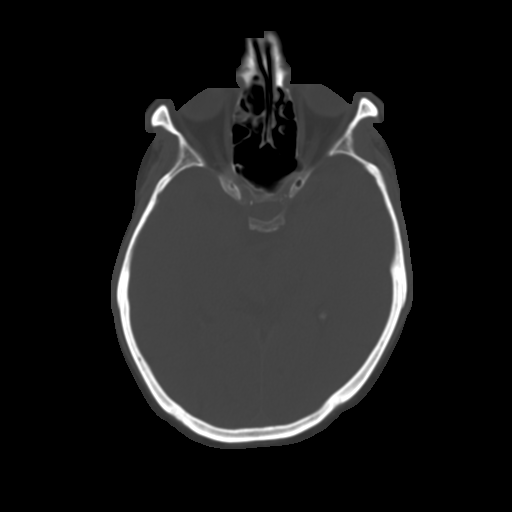
[im 21/36  brain]
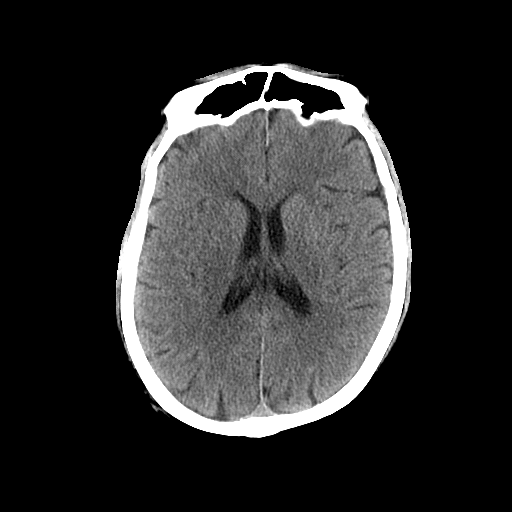
[im 23/36  brain]
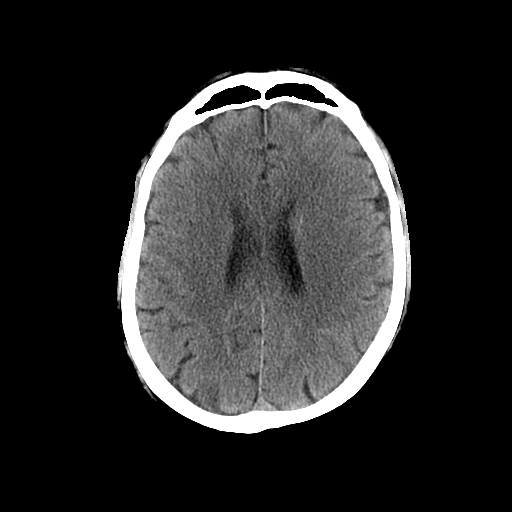
[im 26/36  brain]
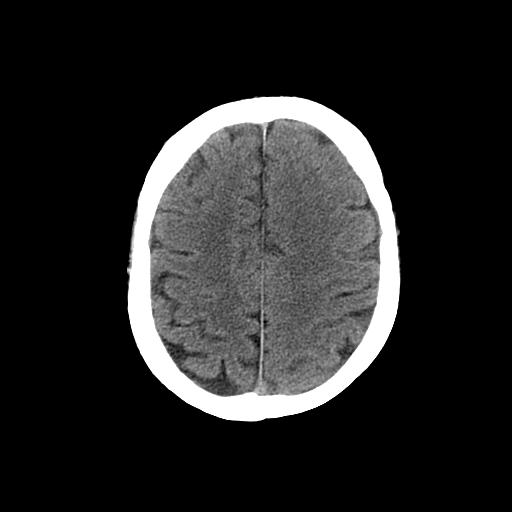
[im 31/36  brain]
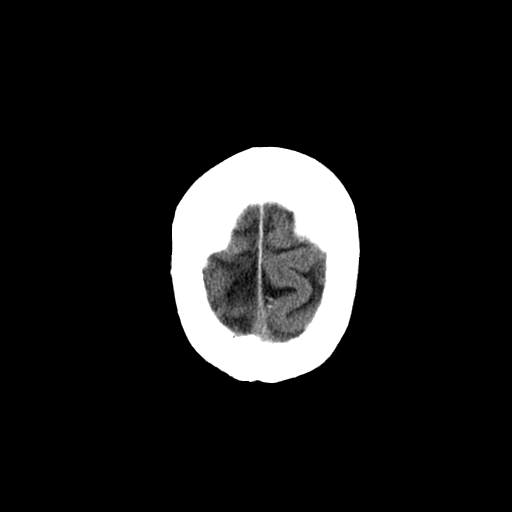
[im 31/36  bone]
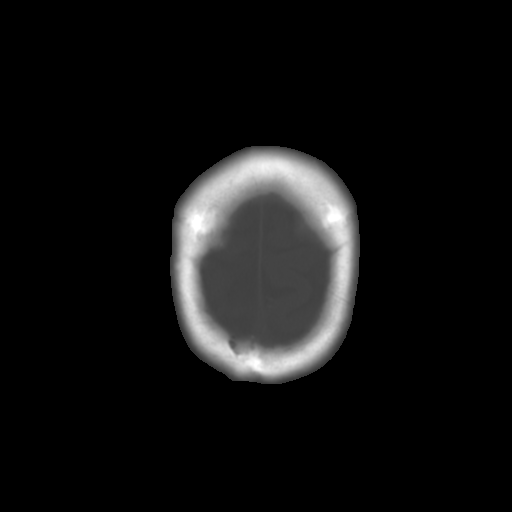
[im 33/36  brain]
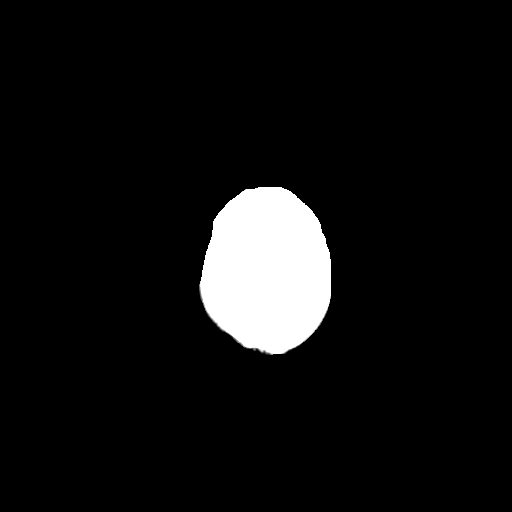

[Series 601: coronal brain · coronal · 0.49mm/px · 3 of 76 slices shown]
[im 26/76  brain]
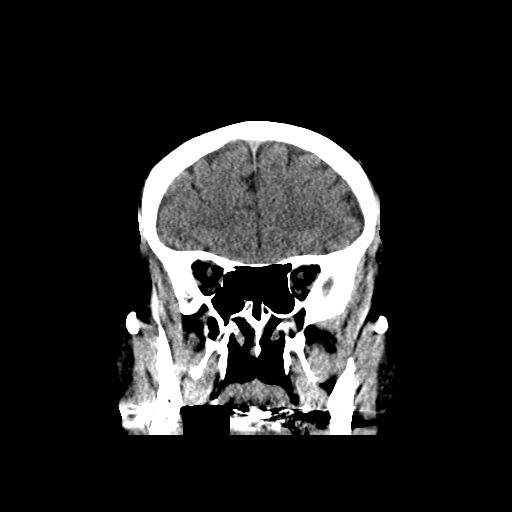
[im 34/76  brain]
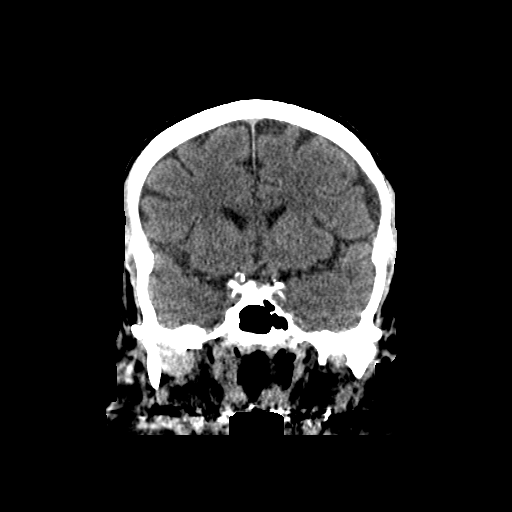
[im 42/76  brain]
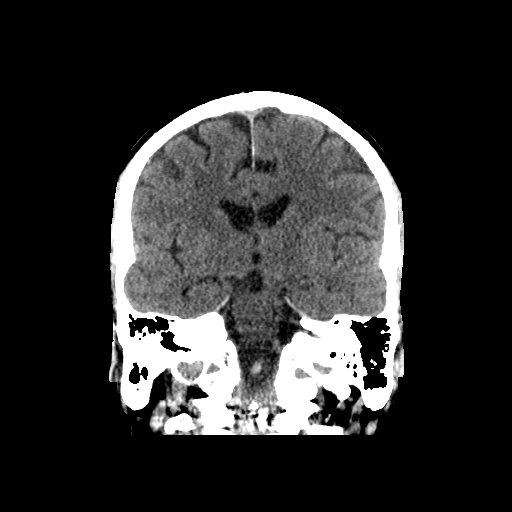

[Series 602: sagittal brain · sagittal · 0.49mm/px · 3 of 62 slices shown]
[im 21/62  brain]
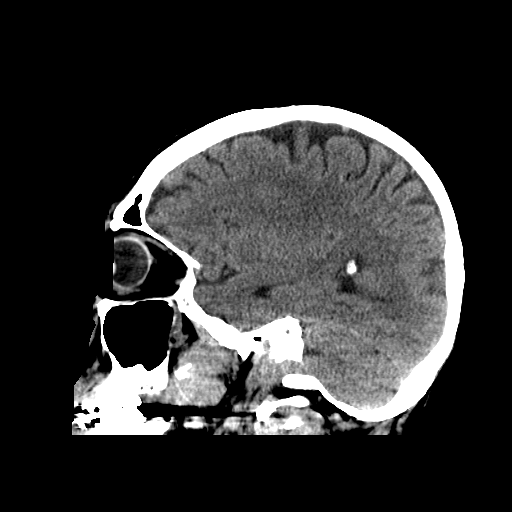
[im 31/62  brain]
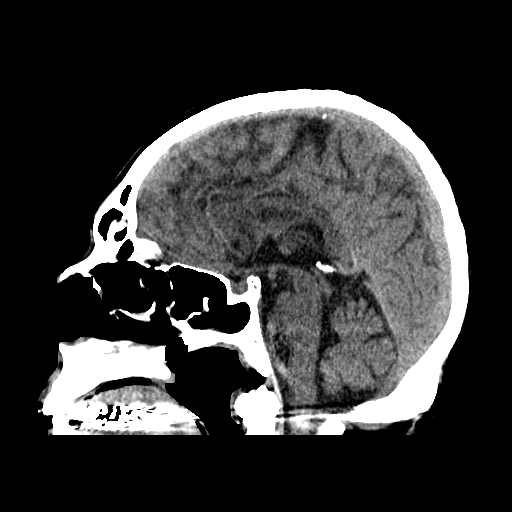
[im 41/62  brain]
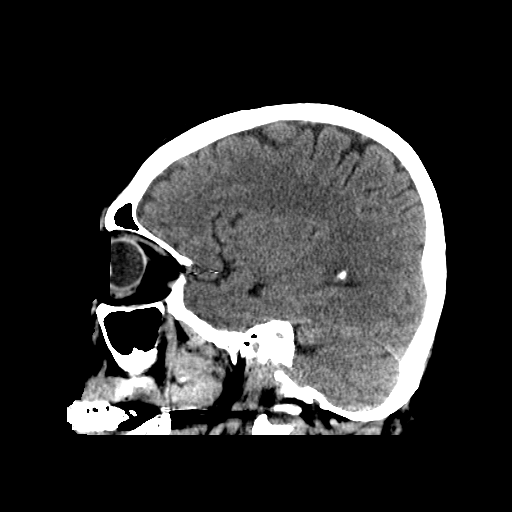

[16 of 47 positions shown; findings below may reference images not displayed]

FINDINGS: Brain: No midline shift, ventriculomegaly, mass effect, evidence of
mass lesion, intracranial hemorrhage or evidence of cortically based
acute infarction. Gray-white matter differentiation is within normal
limits throughout the brain.

Vascular: Mild Calcified atherosclerosis at the skull base.

Skull: No acute osseous abnormality identified.

Sinuses/Orbits: Chronic paranasal sinus polypoid mucosal thickening,
but significantly improved compared to 4868. Residual most affecting
the frontal sinuses, frontoethmoidal recesses, and nasal cavity.
Tympanic cavities and mastoids are clear. No sinus fluid levels
identified.

Other: Visualized orbits and scalp soft tissues are within normal
limits.
IMPRESSION: 1.  Normal noncontrast CT appearance of the brain.
2. Sinonasal polyposis. Associated paranasal sinus disease improved
compared to 4868. No CT evidence of acute sinusitis.

## 2019-08-08 ENCOUNTER — Other Ambulatory Visit: Payer: Self-pay | Admitting: Interventional Cardiology

## 2019-09-01 ENCOUNTER — Encounter: Payer: Self-pay | Admitting: Interventional Cardiology

## 2019-09-01 ENCOUNTER — Other Ambulatory Visit: Payer: Self-pay

## 2019-09-01 ENCOUNTER — Ambulatory Visit: Payer: 59 | Admitting: Interventional Cardiology

## 2019-09-01 VITALS — BP 118/72 | HR 68 | Ht 74.0 in | Wt 243.4 lb

## 2019-09-01 DIAGNOSIS — I2510 Atherosclerotic heart disease of native coronary artery without angina pectoris: Secondary | ICD-10-CM | POA: Diagnosis not present

## 2019-09-01 DIAGNOSIS — E782 Mixed hyperlipidemia: Secondary | ICD-10-CM | POA: Diagnosis not present

## 2019-09-01 DIAGNOSIS — I5022 Chronic systolic (congestive) heart failure: Secondary | ICD-10-CM | POA: Diagnosis not present

## 2019-09-01 DIAGNOSIS — I2584 Coronary atherosclerosis due to calcified coronary lesion: Secondary | ICD-10-CM | POA: Diagnosis not present

## 2019-09-01 MED ORDER — ROSUVASTATIN CALCIUM 10 MG PO TABS: 10.0000 mg | ORAL_TABLET | Freq: Every day | ORAL | 3 refills | Status: DC

## 2019-09-01 NOTE — Patient Instructions (Signed)
Medication Instructions:  Your physician has recommended you make the following change in your medication:   START: rosuvastatin (crestor) 10 mg tablet: Take 1 tablet by mouth once a day   *If you need a refill on your cardiac medications before your next appointment, please call your pharmacy*   Lab Work: Your physician recommends that you return for a FASTING lipid profile and liver function panel in 3 months  If you have labs (blood work) drawn today and your tests are completely normal, you will receive your results only by: Marland Kitchen MyChart Message (if you have MyChart) OR . A paper copy in the mail If you have any lab test that is abnormal or we need to change your treatment, we will call you to review the results.   Testing/Procedures: None ordered   Follow-Up: At [facility], you and your health needs are our priority.  As part of our continuing mission to provide you with exceptional heart care, we have created designated Provider Care Teams.  These Care Teams include your primary Cardiologist (physician) and Advanced Practice Providers (APPs -  Physician Assistants and Nurse Practitioners) who all work together to provide you with the care you need, when you need it.  We recommend signing up for the patient portal called "MyChart".  Sign up information is provided on this After Visit Summary.  MyChart is used to connect with patients for Virtual Visits (Telemedicine).  Patients are able to view lab/test results, encounter notes, upcoming appointments, etc.  Non-urgent messages can be sent to your provider as well.   To learn more about what you can do with MyChart, go to [email or web address].    Your next appointment:   12 month(s)  The format for your next appointment:   In Person  Provider:   You may see Lance Muss, MD or one of the following Advanced Practice Providers on your designated Care Team:    Ronie Spies, PA-C  Jacolyn Reedy, PA-C    Other  Instructions  High-Fiber Diet Fiber, also called dietary fiber, is a type of carbohydrate that is found in fruits, vegetables, whole grains, and beans. A high-fiber diet can have many health benefits. Your health care provider may recommend a high-fiber diet to help:  Prevent constipation. Fiber can make your bowel movements more regular.  Lower your cholesterol.  Relieve the following conditions: ? Swelling of veins in the anus (hemorrhoids). ? Swelling and irritation (inflammation) of specific areas of the digestive tract (uncomplicated diverticulosis). ? A problem of the large intestine (colon) that sometimes causes pain and diarrhea (irritable bowel syndrome, IBS).  Prevent overeating as part of a weight-loss plan.  Prevent heart disease, type 2 diabetes, and certain cancers. What is my plan? The recommended daily fiber intake in grams (g) includes:  38 g for men age 84 or younger.  30 g for men over age 75.  25 g for women age 68 or younger.  21 g for women over age 16. You can get the recommended daily intake of dietary fiber by:  Eating a variety of fruits, vegetables, grains, and beans.  Taking a fiber supplement, if it is not possible to get enough fiber through your diet. What do I need to know about a high-fiber diet?  It is better to get fiber through food sources rather than from fiber supplements. There is not a lot of research about how effective supplements are.  Always check the fiber content on the nutrition facts label of any prepackaged  food. Look for foods that contain 5 g of fiber or more per serving.  Talk with a diet and nutrition specialist (dietitian) if you have questions about specific foods that are recommended or not recommended for your medical condition, especially if those foods are not listed below.  Gradually increase how much fiber you consume. If you increase your intake of dietary fiber too quickly, you may have bloating, cramping, or  gas.  Drink plenty of water. Water helps you to digest fiber. What are tips for following this plan?  Eat a wide variety of high-fiber foods.  Make sure that half of the grains that you eat each day are whole grains.  Eat breads and cereals that are made with whole-grain flour instead of refined flour or white flour.  Eat brown rice, bulgur wheat, or millet instead of white rice.  Start the day with a breakfast that is high in fiber, such as a cereal that contains 5 g of fiber or more per serving.  Use beans in place of meat in soups, salads, and pasta dishes.  Eat high-fiber snacks, such as berries, raw vegetables, nuts, and popcorn.  Choose whole fruits and vegetables instead of processed forms like juice or sauce. What foods can I eat?  Fruits Berries. Pears. Apples. Oranges. Avocado. Prunes and raisins. Dried figs. Vegetables Sweet potatoes. Spinach. Kale. Artichokes. Cabbage. Broccoli. Cauliflower. Green peas. Carrots. Squash. Grains Whole-grain breads. Multigrain cereal. Oats and oatmeal. Brown rice. Barley. Bulgur wheat. [place]. Quinoa. Bran muffins. Popcorn. Rye wafer crackers. Meats and other proteins Navy, kidney, and pinto beans. Soybeans. Split peas. Lentils. Nuts and seeds. Dairy Fiber-fortified yogurt. Beverages Fiber-fortified soy milk. Fiber-fortified orange juice. Other foods Fiber bars. The items listed above may not be a complete list of recommended foods and beverages. Contact a dietitian for more options. What foods are not recommended? Fruits Fruit juice. Cooked, strained fruit. Vegetables Fried potatoes. Canned vegetables. Well-cooked vegetables. Grains White bread. Pasta made with refined flour. White rice. Meats and other proteins Fatty cuts of meat. Fried chicken or fried fish. Dairy Milk. Yogurt. Cream cheese. Sour cream. Fats and oils Butters. Beverages Soft drinks. Other foods Cakes and pastries. The items listed above may not be a  complete list of foods and beverages to avoid. Contact a dietitian for more information. Summary  Fiber is a type of carbohydrate. It is found in fruits, vegetables, whole grains, and beans.  There are many health benefits of eating a high-fiber diet, such as preventing constipation, lowering blood cholesterol, helping with weight loss, and reducing your risk of heart disease, diabetes, and certain cancers.  Gradually increase your intake of fiber. Increasing too fast can result in cramping, bloating, and gas. Drink plenty of water while you increase your fiber.  The best sources of fiber include whole fruits and vegetables, whole grains, nuts, seeds, and beans. This information is not intended to replace advice given to you by your health care provider. Make sure you discuss any questions you have with your health care provider. Document Revised: 02/23/2017 Document Reviewed: 02/23/2017 Elsevier Patient Education  2020 [employer].

## 2019-09-01 NOTE — Progress Notes (Signed)
Cardiology Office Note   Date:  09/01/2019   ID:  Christopher Yu 07-15-1960, MRN [id number]  PCP:  Christopher Funk, MD    No chief complaint on file.  Chronic systolic heart failure  Wt Readings from Last 3 Encounters:  09/01/19 243 lb 6.4 oz (110.4 kg)  08/20/18 243 lb (110.2 kg)  06/01/15 242 lb (109.8 kg)       History of Present Illness: Christopher Yu is a 59 y.o. male  with a history of rheumatoid arthritis and chronic systolic heart failure.  Ejection fraction has been documented in the 35 to 40% range in the past.  He has had stress testing done in the past but no cardiac catheterization.  He does carry a diagnosis of coronary artery disease on his problem list from Dr. Donnie Yu.  ECho in 2016.  Stress test in 2016.  Cath in 2016 was done and there was no significant blockage.  Had problems with a statin in the past.  He does not remember the name.  He can get his cholesterol down with diet.     He has been on medical therapy for his cardiomyopathy for several years.  Denies : Chest pain. Dizziness.  Nitroglycerin use. Orthopnea. Palpitations. Paroxysmal nocturnal dyspnea. Shortness of breath. Syncope.   Minimal edema at the sock line.  Walking 18 holes of golf several times a week.   He got first dose of COVID vaccine.  Delayed based on Rituxan.       Past Medical History:  Diagnosis Date  . Arthritis, rheumatoid (HCC)    dx 1988  . CAD (coronary artery disease), native coronary artery    Mild LAD disease with calcification noted at cath 12//27/16   . Cardiomyopathy (HCC)   . Chronic systolic heart failure (HCC) 05/01/2015  . Deviated nasal septum 06/25/2011  . Hyperlipidemia   . Impingement syndrome of left shoulder 12/22/2017  . Rheumatoid aortitis   . Sleep apnea    does not use cpap - UNABLE TO TOLERATE MASK  . Sleep apnea in adult   . Status post total right knee replacement 06/01/2015    Past Surgical History:    Procedure Laterality Date  . ANKLE FUSION Right 2014   related to his arthritis  . ANKLE FUSION Left 05/25/13   related to his arthritis  . CARDIAC CATHETERIZATION N/A 05/01/2015   Procedure: Left Heart Cath and Coronary Angiography;  Surgeon: Lyn Records, MD;  Location: [facility] INVASIVE CV LAB;  Service: Cardiovascular;  Laterality: N/A;  . FRACTURE SURGERY     left femur fracture x 3  . KNEE ARTHROSCOPY     right x 4  . NASAL SEPTOPLASTY W/ TURBINOPLASTY  06/25/2011   Procedure: NASAL SEPTOPLASTY WITH TURBINATE REDUCTION;  Surgeon: Osborn Coho, MD;  Location: [facility] OR;  Service: ENT;  Laterality: Bilateral;  . TONSILLECTOMY    . TOTAL KNEE ARTHROPLASTY Right 06/01/2015   Procedure: RIGHT TOTAL KNEE ARTHROPLASTY;  Surgeon: Kathryne Hitch, MD;  Location: [facility] ORS;  Service: Orthopedics;  Laterality: Right;  Block+general     Current Outpatient Medications  Medication Sig Dispense Refill  . aspirin 81 MG tablet Take 81 mg by mouth daily.    . folic acid (FOLVITE) 1 MG tablet Take 1 mg by mouth daily.    Marland Kitchen leflunomide (ARAVA) 20 MG tablet Take 20 mg by mouth daily.    Marland Kitchen lisinopril (ZESTRIL) 10 MG tablet Take 1 tablet (10 mg total) by mouth  daily. 90 tablet 3  . methotrexate (RHEUMATREX) 2.5 MG tablet Take 20 mg by mouth once a week. Takes 8 tablets(20mg ) every Saturday.     Caution:Chemotherapy. Protect from light.    . metoprolol succinate (TOPROL-XL) 25 MG 24 hr tablet Take 1 tablet (25 mg total) by mouth daily. Please call and make overdue appt for further refills [phone]. 30 tablet 0  . Multiple Vitamins-Minerals (PRESERVISION AREDS PO) Take 2 capsules by mouth daily.    . predniSONE (DELTASONE) 5 MG tablet Take 5 mg by mouth daily.    Marland Kitchen RITUXAN 500 MG/50ML injection     . sertraline (ZOLOFT) 50 MG tablet Take 50 mg by mouth daily.    Marland Kitchen spironolactone (ALDACTONE) 25 MG tablet Take 1 tablet (25 mg total) by mouth daily. 90 tablet 3  . sulfaSALAzine (AZULFIDINE) 500 MG tablet  Take 1,000 mg by mouth 2 (two) times daily.     . traMADol (ULTRAM) 50 MG tablet Take 50 mg by mouth as needed.     No current facility-administered medications for this visit.    Allergies:   Patient has no known allergies.    Social History:  The patient  reports that he quit smoking about 11 years ago. He has a 10.00 pack-year smoking history. He has never used smokeless tobacco. He reports current alcohol use. He reports that he does not use drugs.   Family History:  The patient's family history includes Lupus in his sister.    ROS:  Please see the history of present illness.   Otherwise, review of systems are positive for increasing exercise.   All other systems are reviewed and negative.    PHYSICAL EXAM: VS:  BP 118/72   Pulse 68   Ht 6\' 2"  (1.88 m)   Wt 243 lb 6.4 oz (110.4 kg)   SpO2 94%   BMI 31.25 kg/m  , BMI Body mass index is 31.25 kg/m. GEN: Well nourished, well developed, in no acute distress  HEENT: normal  Neck: no JVD, carotid bruits, or masses Cardiac: RRR; no murmurs, rubs, or gallops,; trivial pretibial edema  Respiratory:  clear to auscultation bilaterally, normal work of breathing GI: soft, nontender, nondistended, + BS MS: no deformity or atrophy  Skin: warm and dry, no rash Neuro:  Strength and sensation are intact Psych: euthymic mood, full affect   EKG:   The ekg ordered today demonstrates NSR, nonspecific ST changes   Recent Labs: No results found for requested labs within last 8760 hours.   Lipid Panel No results found for: CHOL, TRIG, HDL, CHOLHDL, VLDL, LDLCALC, LDLDIRECT   Other studies Reviewed: Additional studies/ records that were reviewed today with results demonstrating: PMD labs reviewed.   ASSESSMENT AND PLAN:  1. Chronic systolic heart failure: EF 40% in 2019.  Nonischemic cardiomyopathy.  Appears euvolemic. 2. Hyperlipidemia: Start rosuvastatin 10 mg daily.  Recheck lipids in 3 months. 3. Coronary artery calcification:.   Noted LAD calcification in 2016 by catheterization.  No angina.  4. High fiber, whole food plant based diet recommended.    Current medicines are reviewed at length with the patient today.  The patient concerns regarding his medicines were addressed.  The following changes have been made:  No change  Labs/ tests ordered today include: lipids, liver No orders of the defined types were placed in this encounter.   Recommend 150 minutes/week of aerobic exercise Low fat, low carb, high fiber diet recommended  Disposition:   FU in 1 year  Signed, Larae Grooms, MD  09/01/2019 2:32 PM    [facility] [place], [place],   [address] Phone: [phone]; Fax: [phone]

## 2019-09-09 ENCOUNTER — Other Ambulatory Visit: Payer: Self-pay | Admitting: Interventional Cardiology

## 2019-09-20 ENCOUNTER — Other Ambulatory Visit: Payer: Self-pay | Admitting: Interventional Cardiology

## 2020-06-29 DIAGNOSIS — Z79899 Other long term (current) drug therapy: Secondary | ICD-10-CM | POA: Diagnosis not present

## 2020-06-29 DIAGNOSIS — M0579 Rheumatoid arthritis with rheumatoid factor of multiple sites without organ or systems involvement: Secondary | ICD-10-CM | POA: Diagnosis not present

## 2020-06-29 DIAGNOSIS — I429 Cardiomyopathy, unspecified: Secondary | ICD-10-CM | POA: Diagnosis not present

## 2020-06-29 DIAGNOSIS — M79643 Pain in unspecified hand: Secondary | ICD-10-CM | POA: Diagnosis not present

## 2020-09-03 ENCOUNTER — Other Ambulatory Visit: Payer: Self-pay | Admitting: Interventional Cardiology

## 2020-10-24 NOTE — Progress Notes (Signed)
Cardiology Office Note   Date:  10/25/2020   ID:  Christopher Yu October 11, 1960, MRN [id number]  PCP:  Christopher Funk, MD    No chief complaint on file.  Chronic systolic heart failure  Wt Readings from Last 3 Encounters:  10/25/20 251 lb 9.6 oz (114.1 kg)  09/01/19 243 lb 6.4 oz (110.4 kg)  08/20/18 243 lb (110.2 kg)       History of Present Illness: Christopher Yu is a 60 y.o. male  with a history of rheumatoid arthritis and chronic systolic heart failure.  Ejection fraction has been documented in the 35 to 40% range in the past.   He has had stress testing done in the past but no cardiac catheterization.  He does carry a diagnosis of coronary artery disease on his problem list from Dr. Donnie Yu.  ECho in 2016.  Stress test in 2016.  Cath in 2016 was done and there was no significant blockage.   Had problems with a statin in the past.  He does not remember the name.  He can get his cholesterol down with diet.      He has been on medical therapy for his cardiomyopathy for several years.  Since the last visit, he has gained weight.  He plays golf regularly.    Denies : Chest pain. Dizziness. Leg edema. Nitroglycerin use. Orthopnea. Palpitations. Paroxysmal nocturnal dyspnea. Shortness of breath. Syncope.    Past Medical History:  Diagnosis Date   Arthritis, rheumatoid (HCC)    dx 1988   CAD (coronary artery disease), native coronary artery    Mild LAD disease with calcification noted at cath 12//27/16    Cardiomyopathy [facility])    Chronic systolic heart failure (HCC) 05/01/2015   Deviated nasal septum 06/25/2011   Hyperlipidemia    Impingement syndrome of left shoulder 12/22/2017   Rheumatoid aortitis    Sleep apnea    does not use cpap - UNABLE TO TOLERATE MASK   Sleep apnea in adult    Status post total right knee replacement 06/01/2015    Past Surgical History:  Procedure Laterality Date   ANKLE FUSION Right 2014   related to his  arthritis   ANKLE FUSION Left 05/25/13   related to his arthritis   CARDIAC CATHETERIZATION N/A 05/01/2015   Procedure: Left Heart Cath and Coronary Angiography;  Surgeon: Christopher Records, MD;  Location: [facility] INVASIVE CV LAB;  Service: Cardiovascular;  Laterality: N/A;   FRACTURE SURGERY     left femur fracture x 3   KNEE ARTHROSCOPY     right x 4   NASAL SEPTOPLASTY W/ TURBINOPLASTY  06/25/2011   Procedure: NASAL SEPTOPLASTY WITH TURBINATE REDUCTION;  Surgeon: Christopher Coho, MD;  Location: [facility] OR;  Service: ENT;  Laterality: Bilateral;   TONSILLECTOMY     TOTAL KNEE ARTHROPLASTY Right 06/01/2015   Procedure: RIGHT TOTAL KNEE ARTHROPLASTY;  Surgeon: Christopher Hitch, MD;  Location: [facility] ORS;  Service: Orthopedics;  Laterality: Right;  Block+general     Current Outpatient Medications  Medication Sig Dispense Refill   aspirin 81 MG tablet Take 81 mg by mouth daily.     folic acid (FOLVITE) 1 MG tablet Take 1 mg by mouth daily.     leflunomide (ARAVA) 20 MG tablet Take 20 mg by mouth daily.     lisinopril (ZESTRIL) 10 MG tablet TAKE 1 TABLET BY MOUTH EVERY DAY 90 tablet 3   methotrexate (RHEUMATREX) 2.5 MG tablet Take 20 mg by mouth  once a week. Takes 8 tablets(20mg ) every Saturday.     Caution:Chemotherapy. Protect from light.     metoprolol succinate (TOPROL-XL) 25 MG 24 hr tablet TAKE 1 TABLET BY MOUTH EVERY DAY 90 tablet 0   Multiple Vitamins-Minerals (PRESERVISION AREDS PO) Take 2 capsules by mouth daily.     predniSONE (DELTASONE) 5 MG tablet Take 5 mg by mouth daily.     RITUXAN 500 MG/50ML injection      rosuvastatin (CRESTOR) 10 MG tablet TAKE 1 TABLET BY MOUTH EVERY DAY 90 tablet 0   sertraline (ZOLOFT) 50 MG tablet Take 50 mg by mouth daily.     spironolactone (ALDACTONE) 25 MG tablet Take 1 tablet (25 mg total) by mouth daily. 90 tablet 3   sulfaSALAzine (AZULFIDINE) 500 MG tablet Take 1,000 mg by mouth 2 (two) times daily.      traMADol (ULTRAM) 50 MG tablet Take 50 mg by  mouth as needed.     No current facility-administered medications for this visit.    Allergies:   Patient has no known allergies.    Social History:  The patient  reports that he quit smoking about 12 years ago. His smoking use included cigarettes. He has a 10.00 pack-year smoking history. He has never used smokeless tobacco. He reports current alcohol use. He reports that he does not use drugs.   Family History:  The patient's family history includes Lupus in his sister.    ROS:  Please see the history of present illness.   Otherwise, review of systems are positive for weight gain.   All other systems are reviewed and negative.    PHYSICAL EXAM: VS:  BP 112/70   Pulse 72   Ht 6\' 2"  (1.88 m)   Wt 251 lb 9.6 oz (114.1 kg)   SpO2 95%   BMI 32.30 kg/m  , BMI Body mass index is 32.3 kg/m. GEN: Well nourished, well developed, in no acute distress HEENT: normal Neck: no JVD, carotid bruits, or masses Cardiac: RRR; no murmurs, rubs, or gallops,tr LE edema  Respiratory:  clear to auscultation bilaterally, normal work of breathing GI: soft, nontender, nondistended, + BS MS: no deformity or atrophy Skin: warm and dry, no rash Neuro:  Strength and sensation are intact Psych: euthymic mood, full affect   EKG:   The ekg ordered today demonstrates NSR, nonspecific ST flattening; no change from the prior   Recent Labs: No results found for requested labs within last 8760 hours.   Lipid Panel No results found for: CHOL, TRIG, HDL, CHOLHDL, VLDL, LDLCALC, LDLDIRECT   Other studies Reviewed: Additional studies/ Yu that were reviewed today with results demonstrating: LDL 94.    ASSESSMENT AND PLAN:  Chronic systolic heart failure/NICM: EF 40% in 2019 per Dr. Donnie Yu. No signs of heart failure. Labs to be checked in 8/22. Rx given for him to take to [facility].  We went over rationale behind his current CHF eds. He was hoping to decrease meds but is agreeable to continue what he is  currently on.  Hyperlipidemia: LDL 94.  Tolerating rosuvastatin.   Coronary artery calcification: No angina.  Negative cath in 2016. High fiber, whole food, plant based diet.    Current medicines are reviewed at length with the patient today.  The patient concerns regarding his medicines were addressed.  The following changes have been made:  No change  Labs/ tests ordered today include:  No orders of the defined types were placed in this encounter.  Recommend 150 minutes/week of aerobic exercise Low fat, low carb, high fiber diet recommended  Disposition:   FU in 1 year   Signed, Lance Muss, MD  10/25/2020 10:15 AM    [facility] [address] [place], [place], [place]  [address] Phone: [phone]; Fax: [phone]

## 2020-10-25 ENCOUNTER — Ambulatory Visit: Payer: 59 | Admitting: Interventional Cardiology

## 2020-10-25 ENCOUNTER — Encounter: Payer: Self-pay | Admitting: Interventional Cardiology

## 2020-10-25 ENCOUNTER — Other Ambulatory Visit: Payer: Self-pay

## 2020-10-25 VITALS — BP 112/70 | HR 72 | Ht 74.0 in | Wt 251.6 lb

## 2020-10-25 DIAGNOSIS — I2510 Atherosclerotic heart disease of native coronary artery without angina pectoris: Secondary | ICD-10-CM

## 2020-10-25 DIAGNOSIS — I5022 Chronic systolic (congestive) heart failure: Secondary | ICD-10-CM

## 2020-10-25 DIAGNOSIS — E782 Mixed hyperlipidemia: Secondary | ICD-10-CM

## 2020-10-25 DIAGNOSIS — I2584 Coronary atherosclerosis due to calcified coronary lesion: Secondary | ICD-10-CM

## 2020-10-25 NOTE — Patient Instructions (Signed)
Medication Instructions:  Your physician recommends that you continue on your current medications as directed. Please refer to the Current Medication list given to you today.  *If you need a refill on your cardiac medications before your next appointment, please call your pharmacy*   Lab Work: Have lipid profile and CMET done at [facility] in August.  This will be fasting  If you have labs (blood work) drawn today and your tests are completely normal, you will receive your results only by: MyChart Message (if you have MyChart) OR A paper copy in the mail If you have any lab test that is abnormal or we need to change your treatment, we will call you to review the results.   Testing/Procedures: none   Follow-Up: At [facility], you and your health needs are our priority.  As part of our continuing mission to provide you with exceptional heart care, we have created designated Provider Care Teams.  These Care Teams include your primary Cardiologist (physician) and Advanced Practice Providers (APPs -  Physician Assistants and Nurse Practitioners) who all work together to provide you with the care you need, when you need it.  We recommend signing up for the patient portal called "MyChart".  Sign up information is provided on this After Visit Summary.  MyChart is used to connect with patients for Virtual Visits (Telemedicine).  Patients are able to view lab/test results, encounter notes, upcoming appointments, etc.  Non-urgent messages can be sent to your provider as well.   To learn more about what you can do with MyChart, go to [email or web address].    Your next appointment:   12 month(s)  The format for your next appointment:   In Person  Provider:   You may see Lance Muss, MD or one of the following Advanced Practice Providers on your designated Care Team:   Ronie Spies, PA-C Jacolyn Reedy, PA-C   Other Instructions

## 2020-11-06 DIAGNOSIS — M9904 Segmental and somatic dysfunction of sacral region: Secondary | ICD-10-CM | POA: Diagnosis not present

## 2020-11-06 DIAGNOSIS — Z79899 Other long term (current) drug therapy: Secondary | ICD-10-CM | POA: Diagnosis not present

## 2020-11-06 DIAGNOSIS — M5137 Other intervertebral disc degeneration, lumbosacral region: Secondary | ICD-10-CM | POA: Diagnosis not present

## 2020-11-06 DIAGNOSIS — M5386 Other specified dorsopathies, lumbar region: Secondary | ICD-10-CM | POA: Diagnosis not present

## 2020-11-06 DIAGNOSIS — M0579 Rheumatoid arthritis with rheumatoid factor of multiple sites without organ or systems involvement: Secondary | ICD-10-CM | POA: Diagnosis not present

## 2020-11-06 DIAGNOSIS — M79643 Pain in unspecified hand: Secondary | ICD-10-CM | POA: Diagnosis not present

## 2020-11-06 DIAGNOSIS — M9903 Segmental and somatic dysfunction of lumbar region: Secondary | ICD-10-CM | POA: Diagnosis not present

## 2020-11-06 DIAGNOSIS — I429 Cardiomyopathy, unspecified: Secondary | ICD-10-CM | POA: Diagnosis not present

## 2020-11-09 DIAGNOSIS — M5137 Other intervertebral disc degeneration, lumbosacral region: Secondary | ICD-10-CM | POA: Diagnosis not present

## 2020-11-09 DIAGNOSIS — M5386 Other specified dorsopathies, lumbar region: Secondary | ICD-10-CM | POA: Diagnosis not present

## 2020-11-09 DIAGNOSIS — M9904 Segmental and somatic dysfunction of sacral region: Secondary | ICD-10-CM | POA: Diagnosis not present

## 2020-11-09 DIAGNOSIS — M9903 Segmental and somatic dysfunction of lumbar region: Secondary | ICD-10-CM | POA: Diagnosis not present

## 2020-11-13 DIAGNOSIS — M5137 Other intervertebral disc degeneration, lumbosacral region: Secondary | ICD-10-CM | POA: Diagnosis not present

## 2020-11-13 DIAGNOSIS — M9903 Segmental and somatic dysfunction of lumbar region: Secondary | ICD-10-CM | POA: Diagnosis not present

## 2020-11-13 DIAGNOSIS — M5386 Other specified dorsopathies, lumbar region: Secondary | ICD-10-CM | POA: Diagnosis not present

## 2020-11-13 DIAGNOSIS — M9904 Segmental and somatic dysfunction of sacral region: Secondary | ICD-10-CM | POA: Diagnosis not present

## 2020-11-15 DIAGNOSIS — M9904 Segmental and somatic dysfunction of sacral region: Secondary | ICD-10-CM | POA: Diagnosis not present

## 2020-11-15 DIAGNOSIS — M5386 Other specified dorsopathies, lumbar region: Secondary | ICD-10-CM | POA: Diagnosis not present

## 2020-11-15 DIAGNOSIS — M5137 Other intervertebral disc degeneration, lumbosacral region: Secondary | ICD-10-CM | POA: Diagnosis not present

## 2020-11-15 DIAGNOSIS — M9903 Segmental and somatic dysfunction of lumbar region: Secondary | ICD-10-CM | POA: Diagnosis not present

## 2020-11-20 DIAGNOSIS — M9903 Segmental and somatic dysfunction of lumbar region: Secondary | ICD-10-CM | POA: Diagnosis not present

## 2020-11-20 DIAGNOSIS — M5386 Other specified dorsopathies, lumbar region: Secondary | ICD-10-CM | POA: Diagnosis not present

## 2020-11-20 DIAGNOSIS — M9904 Segmental and somatic dysfunction of sacral region: Secondary | ICD-10-CM | POA: Diagnosis not present

## 2020-11-20 DIAGNOSIS — M5137 Other intervertebral disc degeneration, lumbosacral region: Secondary | ICD-10-CM | POA: Diagnosis not present

## 2020-11-22 ENCOUNTER — Other Ambulatory Visit: Payer: Self-pay | Admitting: Interventional Cardiology

## 2020-11-27 DIAGNOSIS — M9904 Segmental and somatic dysfunction of sacral region: Secondary | ICD-10-CM | POA: Diagnosis not present

## 2020-11-27 DIAGNOSIS — M5137 Other intervertebral disc degeneration, lumbosacral region: Secondary | ICD-10-CM | POA: Diagnosis not present

## 2020-11-27 DIAGNOSIS — M9903 Segmental and somatic dysfunction of lumbar region: Secondary | ICD-10-CM | POA: Diagnosis not present

## 2020-11-27 DIAGNOSIS — M5386 Other specified dorsopathies, lumbar region: Secondary | ICD-10-CM | POA: Diagnosis not present

## 2020-12-03 ENCOUNTER — Other Ambulatory Visit: Payer: Self-pay | Admitting: Interventional Cardiology

## 2021-01-28 ENCOUNTER — Other Ambulatory Visit: Payer: Self-pay

## 2021-02-04 ENCOUNTER — Other Ambulatory Visit: Payer: Self-pay

## 2021-02-04 MED ORDER — METOPROLOL SUCCINATE ER 25 MG PO TB24: 25.0000 mg | ORAL_TABLET | Freq: Every day | ORAL | 2 refills | Status: DC

## 2021-02-04 MED ORDER — SPIRONOLACTONE 25 MG PO TABS: 25.0000 mg | ORAL_TABLET | Freq: Every day | ORAL | 2 refills | Status: DC

## 2021-02-04 MED ORDER — LISINOPRIL 10 MG PO TABS: 10.0000 mg | ORAL_TABLET | Freq: Every day | ORAL | 2 refills | Status: DC

## 2021-02-04 MED ORDER — ROSUVASTATIN CALCIUM 10 MG PO TABS: 10.0000 mg | ORAL_TABLET | Freq: Every day | ORAL | 2 refills | Status: DC

## 2021-02-20 DIAGNOSIS — M0579 Rheumatoid arthritis with rheumatoid factor of multiple sites without organ or systems involvement: Secondary | ICD-10-CM | POA: Diagnosis not present

## 2021-02-20 DIAGNOSIS — M810 Age-related osteoporosis without current pathological fracture: Secondary | ICD-10-CM | POA: Diagnosis not present

## 2021-02-20 DIAGNOSIS — Z79899 Other long term (current) drug therapy: Secondary | ICD-10-CM | POA: Diagnosis not present

## 2021-02-20 DIAGNOSIS — I429 Cardiomyopathy, unspecified: Secondary | ICD-10-CM | POA: Diagnosis not present

## 2021-02-20 DIAGNOSIS — M79643 Pain in unspecified hand: Secondary | ICD-10-CM | POA: Diagnosis not present

## 2021-02-20 DIAGNOSIS — M549 Dorsalgia, unspecified: Secondary | ICD-10-CM | POA: Diagnosis not present

## 2021-02-20 DIAGNOSIS — Z23 Encounter for immunization: Secondary | ICD-10-CM | POA: Diagnosis not present

## 2021-02-25 ENCOUNTER — Other Ambulatory Visit: Payer: Self-pay | Admitting: Interventional Cardiology

## 2021-03-07 DIAGNOSIS — M0579 Rheumatoid arthritis with rheumatoid factor of multiple sites without organ or systems involvement: Secondary | ICD-10-CM | POA: Diagnosis not present

## 2021-03-22 DIAGNOSIS — H2513 Age-related nuclear cataract, bilateral: Secondary | ICD-10-CM | POA: Diagnosis not present

## 2021-03-22 DIAGNOSIS — H353131 Nonexudative age-related macular degeneration, bilateral, early dry stage: Secondary | ICD-10-CM | POA: Diagnosis not present

## 2021-03-22 DIAGNOSIS — H04123 Dry eye syndrome of bilateral lacrimal glands: Secondary | ICD-10-CM | POA: Diagnosis not present

## 2021-03-22 DIAGNOSIS — H524 Presbyopia: Secondary | ICD-10-CM | POA: Diagnosis not present

## 2021-05-28 DIAGNOSIS — I429 Cardiomyopathy, unspecified: Secondary | ICD-10-CM | POA: Diagnosis not present

## 2021-05-28 DIAGNOSIS — M0579 Rheumatoid arthritis with rheumatoid factor of multiple sites without organ or systems involvement: Secondary | ICD-10-CM | POA: Diagnosis not present

## 2021-05-28 DIAGNOSIS — M810 Age-related osteoporosis without current pathological fracture: Secondary | ICD-10-CM | POA: Diagnosis not present

## 2021-05-28 DIAGNOSIS — M79643 Pain in unspecified hand: Secondary | ICD-10-CM | POA: Diagnosis not present

## 2021-05-28 DIAGNOSIS — Z79899 Other long term (current) drug therapy: Secondary | ICD-10-CM | POA: Diagnosis not present

## 2021-06-26 DIAGNOSIS — E7800 Pure hypercholesterolemia, unspecified: Secondary | ICD-10-CM | POA: Diagnosis not present

## 2021-06-26 DIAGNOSIS — M8580 Other specified disorders of bone density and structure, unspecified site: Secondary | ICD-10-CM | POA: Diagnosis not present

## 2021-06-26 DIAGNOSIS — I427 Cardiomyopathy due to drug and external agent: Secondary | ICD-10-CM | POA: Diagnosis not present

## 2021-06-26 DIAGNOSIS — R6882 Decreased libido: Secondary | ICD-10-CM | POA: Diagnosis not present

## 2021-06-26 DIAGNOSIS — R5383 Other fatigue: Secondary | ICD-10-CM | POA: Diagnosis not present

## 2021-06-26 DIAGNOSIS — M069 Rheumatoid arthritis, unspecified: Secondary | ICD-10-CM | POA: Diagnosis not present

## 2021-06-26 DIAGNOSIS — Z0000 Encounter for general adult medical examination without abnormal findings: Secondary | ICD-10-CM | POA: Diagnosis not present

## 2021-06-26 DIAGNOSIS — Z125 Encounter for screening for malignant neoplasm of prostate: Secondary | ICD-10-CM | POA: Diagnosis not present

## 2021-07-03 DIAGNOSIS — E291 Testicular hypofunction: Secondary | ICD-10-CM | POA: Diagnosis not present

## 2021-07-16 DIAGNOSIS — E291 Testicular hypofunction: Secondary | ICD-10-CM | POA: Diagnosis not present

## 2021-08-05 DIAGNOSIS — E2740 Unspecified adrenocortical insufficiency: Secondary | ICD-10-CM | POA: Diagnosis not present

## 2021-08-05 DIAGNOSIS — R7989 Other specified abnormal findings of blood chemistry: Secondary | ICD-10-CM | POA: Diagnosis not present

## 2021-08-09 ENCOUNTER — Ambulatory Visit: Payer: BC Managed Care – PPO | Attending: Internal Medicine

## 2021-08-09 DIAGNOSIS — M5441 Lumbago with sciatica, right side: Secondary | ICD-10-CM | POA: Insufficient documentation

## 2021-08-09 DIAGNOSIS — M5459 Other low back pain: Secondary | ICD-10-CM

## 2021-08-09 DIAGNOSIS — R262 Difficulty in walking, not elsewhere classified: Secondary | ICD-10-CM

## 2021-08-09 DIAGNOSIS — R252 Cramp and spasm: Secondary | ICD-10-CM

## 2021-08-09 DIAGNOSIS — M6281 Muscle weakness (generalized): Secondary | ICD-10-CM

## 2021-08-09 NOTE — Therapy (Signed)
?OUTPATIENT PHYSICAL THERAPY THORACOLUMBAR EVALUATION ? ? ?Patient Name: Christopher Yu ?MRN: [id number] ?DOB:03-Jan-1961, 61 y.o., male ?Today's Date: 08/09/2021 ? ? PT End of Session - 08/09/21 1016   ? ? Visit Number 1   ? Authorization Type BCBS   ? PT Start Time 1015   ? PT Stop Time 1100   ? PT Time Calculation (min) 45 min   ? Activity Tolerance Patient tolerated treatment well   ? Behavior During Therapy [facility] for tasks assessed/performed   ? ?  ?  ? ?  ? ? ?Past Medical History:  ?Diagnosis Date  ? Arthritis, rheumatoid (HCC)   ? dx 1988  ? CAD (coronary artery disease), native coronary artery   ? Mild LAD disease with calcification noted at cath 12//27/16   ? Cardiomyopathy (HCC)   ? Chronic systolic heart failure (HCC) 05/01/2015  ? Deviated nasal septum 06/25/2011  ? Hyperlipidemia   ? Impingement syndrome of left shoulder 12/22/2017  ? Rheumatoid aortitis   ? Sleep apnea   ? does not use cpap - UNABLE TO TOLERATE MASK  ? Sleep apnea in adult   ? Status post total right knee replacement 06/01/2015  ? ?Past Surgical History:  ?Procedure Laterality Date  ? ANKLE FUSION Right 2014  ? related to his arthritis  ? ANKLE FUSION Left 05/25/13  ? related to his arthritis  ? CARDIAC CATHETERIZATION N/A 05/01/2015  ? Procedure: Left Heart Cath and Coronary Angiography;  Surgeon: Lyn Records, MD;  Location: [facility] INVASIVE CV LAB;  Service: Cardiovascular;  Laterality: N/A;  ? FRACTURE SURGERY    ? left femur fracture x 3  ? KNEE ARTHROSCOPY    ? right x 4  ? NASAL SEPTOPLASTY W/ TURBINOPLASTY  06/25/2011  ? Procedure: NASAL SEPTOPLASTY WITH TURBINATE REDUCTION;  Surgeon: Osborn Coho, MD;  Location: [facility] OR;  Service: ENT;  Laterality: Bilateral;  ? TONSILLECTOMY    ? TOTAL KNEE ARTHROPLASTY Right 06/01/2015  ? Procedure: RIGHT TOTAL KNEE ARTHROPLASTY;  Surgeon: Kathryne Hitch, MD;  Location: [facility] ORS;  Service: Orthopedics;  Laterality: Right;  Block+general  ? ?Patient Active Problem List  ? Diagnosis Date  Noted  ? Impingement syndrome of left shoulder 12/22/2017  ? Impingement syndrome of right shoulder 12/22/2017  ? Rheumatoid arthritis involving right knee (HCC) 06/01/2015  ? Status post total right knee replacement 06/01/2015  ? Cardiomyopathy (HCC) 05/02/2015  ? Hyperlipidemia   ? Sleep apnea in adult   ? Rheumatoid arthritis (HCC)   ? Chronic systolic heart failure (HCC) 05/01/2015  ? CAD (coronary artery disease), native coronary artery   ? ? ?PCP: Kirby Funk, MD ? ?REFERRING PROVIDER: Kirby Funk, MD ? ?REFERRING DIAG: M54.41 (ICD-10-CM) - Lumbago with sciatica, right side  ? ?THERAPY DIAG:  ?Other low back pain ? ?Muscle weakness (generalized) ? ?Difficulty in walking, not elsewhere classified ? ?Cramp and spasm ? ?ONSET DATE: 07-03-21 ? ?SUBJECTIVE:                                                                                                                                                                                          ? ?  SUBJECTIVE STATEMENT: ?Patient has history of RA and bilateral ankle fusions.  He enjoys golfing.   He states that he typically has exacerbations when he is really active.  Per patient, for example, if he plays golf on Monday, he experiences elevated pain the following day.  He has cardiomyopathy due to his history of high doses of medication for RA.  He states his back pain has lasted for about one year.  He has played through the pain typically. He states that he gets most of hs pain when sitting for long periods of time.  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  He confirms some discomfort in the right leg.  Unsure if this is related to his back.  Right handed golfer.  Plays one time per week.  Has membership at [employer].  Has not been going due to his back pain.  But also admits he gets bored and is more of a recumbant bike, watch a movie while pedaling type exercise person.   ?PERTINENT HISTORY:  ?RA ? ?PAIN:  ?Are you having pain?  Yes: NPRS scale: 3/10 ?Pain location: low back and buttocks ?Pain description: Aching ?Aggravating factors: prolonged sitting ?Relieving factors: foam rolling and stretching ? ? ?PRECAUTIONS: None ? ?WEIGHT BEARING RESTRICTIONS No ? ?FALLS:  ?Has patient fallen in last 6 months? No ? ?LIVING ENVIRONMENT: ?Lives with: lives with their spouse ?Lives in: House/apartment ?Stairs: Yes: Internal: 16 steps; can reach both and External: 5 steps; can reach both ?Has following equipment at home: None ? ?OCCUPATION: Community education officer works from [employer] ? ?PLOF: Independent ? ?PATIENT GOALS To find things that I can do to alleviate the pain ? ? ?OBJECTIVE:  ? ?DIAGNOSTIC FINDINGS:  ?none ? ?PATIENT SURVEYS:  ?FOTO 78 ? ?SCREENING FOR RED FLAGS: ?Bowel or bladder incontinence: No ?Spinal tumors: No ?Cauda equina syndrome: No ?Compression fracture: No ?Abdominal aneurysm: No ? ?COGNITION: ? Overall cognitive status: Within functional limits for tasks assessed   ?  ?SENSATION: ?WFL ? ?MUSCLE LENGTH: ?Hamstrings: Right 50 deg; Left 40 deg ?Thomas test: Right pos; Left pos ? ?POSTURE:  ?No observable abnormalities ? ? ?LUMBAR ROM:  ? ?Active  A/PROM  ?08/09/2021  ?Flexion Fingertiips to mid shin  ?Extension wnl  ?Right lateral flexion To joint line  ?Left lateral flexion To just above joint line  ?Right rotation wnl  ?Left rotation wnl  ? (Blank rows = not tested) ? ?LE ROM: ? ?WNL ? ?LE MMT: ? ?Generally 4 to 4+/5 ? ?LUMBAR SPECIAL TESTS:  ?Slump test: Negative and FABER test: Negative ? ? ?GAIT: ?Distance walked: 50 ?Assistive device utilized: None ?Level of assistance: Complete Independence ?Comments: antalgic ? ? ? ?TODAY'S TREATMENT  ?Initial eval completed and initiated HEP ? ? ?PATIENT EDUCATION:  ?Education details: initiated HEP ?Person educated: Patient ?Education method: Explanation, Demonstration, Verbal cues, and Handouts ?Education comprehension: verbalized understanding, returned demonstration, and verbal  cues required ? ? ?HOME EXERCISE PROGRAM: ?Access Code: [email or web address] ?URL: [email or web address] ?Date: 08/09/2021 ?Prepared by: Mikey Kirschner ? ?Exercises ?- Standing Hamstring Stretch on Chair  - 2 x daily - 7 x weekly - 1 sets - 3 reps - 30 hold ?- Materials engineer with Table and Chair Support  - 2 x daily - 7 x weekly - 1 sets - 3 reps - 30 hold ?- Supine Hamstring Stretch with Strap  - 2 x daily - 7 x weekly - 1 sets - 3 reps - 30  hold ?- Supine ITB Stretch with Strap  - 2 x daily - 7 x weekly - 1 sets - 3 reps - 30 hold ?- Supine Piriformis Stretch Pulling Heel to Hip  - 2 x daily - 7 x weekly - 1 sets - 3 reps - 30 hold ?- Seated Piriformis Stretch with Trunk Bend  - 2 x daily - 7 x weekly - 1 sets - 3 reps - 30 hold ? ?ASSESSMENT: ? ?CLINICAL IMPRESSION: ?Patient is a 61 y.o. male who was seen today for physical therapy evaluation and treatment for low back pain. He presents with extremely tight hamstrings bilaterally, weakness bilateral hip ER, IR, abduction and extension.  He would respond well to LE flexibility, core stabilization and pain control.   ? ? ?OBJECTIVE IMPAIRMENTS cardiopulmonary status limiting activity, difficulty walking, decreased ROM, decreased strength, increased fascial restrictions, increased muscle spasms, impaired flexibility, improper body mechanics, postural dysfunction, and pain.  ? ?ACTIVITY LIMITATIONS cleaning, community activity, driving, meal prep, occupation, laundry, yard work, and shopping.  ? ?PERSONAL FACTORS Fitness, Past/current experiences, Profession, Social background, Time since onset of injury/illness/exacerbation, and 1-2 comorbidities: cardiomyopathy, CHF, RA  are also affecting patient's functional outcome.  ? ? ?REHAB POTENTIAL: Good ? ?CLINICAL DECISION MAKING: Evolving/moderate complexity ? ?EVALUATION COMPLEXITY: Moderate ? ? ?GOALS: ?Goals reviewed with patient? Yes ? ?SHORT TERM GOALS: Target date: 09/06/2021 ? ?Patient will be independent  with initial HEP  ?Baseline: ?Goal status: INITIAL ? ?2.  Pain report to be no greater than 4/10  ?Baseline:  ?Goal status: INITIAL ? ?3.  Patient to be able to complete 9 holes of golf without low back pa

## 2021-08-14 ENCOUNTER — Ambulatory Visit: Payer: BC Managed Care – PPO

## 2021-08-14 DIAGNOSIS — M5441 Lumbago with sciatica, right side: Secondary | ICD-10-CM | POA: Diagnosis not present

## 2021-08-14 DIAGNOSIS — M5459 Other low back pain: Secondary | ICD-10-CM

## 2021-08-14 DIAGNOSIS — R262 Difficulty in walking, not elsewhere classified: Secondary | ICD-10-CM

## 2021-08-14 DIAGNOSIS — M6281 Muscle weakness (generalized): Secondary | ICD-10-CM

## 2021-08-14 DIAGNOSIS — R252 Cramp and spasm: Secondary | ICD-10-CM

## 2021-08-14 NOTE — Therapy (Signed)
?OUTPATIENT PHYSICAL THERAPY TREATMENT NOTE ? ? ?Patient Name: Christopher Yu ?MRN: [id number] ?DOB:[birth year], 61 y.o., male ?Today's Date: 08/14/2021 ? ?PCP: Kirby Funk, MD ?REFERRING PROVIDER: Kirby Funk, MD ? ?END OF SESSION:  ? PT End of Session - 08/14/21 1058   ? ? Visit Number 2   ? Authorization Type BCBS   ? PT Start Time 1016   ? PT Stop Time 1059   ? PT Time Calculation (min) 43 min   ? Activity Tolerance Patient tolerated treatment well   ? Behavior During Therapy [facility] for tasks assessed/performed   ? ?  ?  ? ?  ? ? ?Past Medical History:  ?Diagnosis Date  ? Arthritis, rheumatoid (HCC)   ? dx 1988  ? CAD (coronary artery disease), native coronary artery   ? Mild LAD disease with calcification noted at cath 12//27/16   ? Cardiomyopathy (HCC)   ? Chronic systolic heart failure (HCC) 05/01/2015  ? Deviated nasal septum 06/25/2011  ? Hyperlipidemia   ? Impingement syndrome of left shoulder 12/22/2017  ? Rheumatoid aortitis   ? Sleep apnea   ? does not use cpap - UNABLE TO TOLERATE MASK  ? Sleep apnea in adult   ? Status post total right knee replacement 06/01/2015  ? ?Past Surgical History:  ?Procedure Laterality Date  ? ANKLE FUSION Right 2014  ? related to his arthritis  ? ANKLE FUSION Left 05/25/13  ? related to his arthritis  ? CARDIAC CATHETERIZATION N/A 05/01/2015  ? Procedure: Left Heart Cath and Coronary Angiography;  Surgeon: Lyn Records, MD;  Location: [facility] INVASIVE CV LAB;  Service: Cardiovascular;  Laterality: N/A;  ? FRACTURE SURGERY    ? left femur fracture x 3  ? KNEE ARTHROSCOPY    ? right x 4  ? NASAL SEPTOPLASTY W/ TURBINOPLASTY  06/25/2011  ? Procedure: NASAL SEPTOPLASTY WITH TURBINATE REDUCTION;  Surgeon: Osborn Coho, MD;  Location: [facility] OR;  Service: ENT;  Laterality: Bilateral;  ? TONSILLECTOMY    ? TOTAL KNEE ARTHROPLASTY Right 06/01/2015  ? Procedure: RIGHT TOTAL KNEE ARTHROPLASTY;  Surgeon: Kathryne Hitch, MD;  Location: [facility] ORS;  Service: Orthopedics;   Laterality: Right;  Block+general  ? ?Patient Active Problem List  ? Diagnosis Date Noted  ? Impingement syndrome of left shoulder 12/22/2017  ? Impingement syndrome of right shoulder 12/22/2017  ? Rheumatoid arthritis involving right knee (HCC) 06/01/2015  ? Status post total right knee replacement 06/01/2015  ? Cardiomyopathy (HCC) 05/02/2015  ? Hyperlipidemia   ? Sleep apnea in adult   ? Rheumatoid arthritis (HCC)   ? Chronic systolic heart failure (HCC) 05/01/2015  ? CAD (coronary artery disease), native coronary artery   ? ? ?REFERRING DIAG: M54.41 (ICD-10-CM) - Lumbago with sciatica, right side  ? ?THERAPY DIAG:  ?Other low back pain ? ?Muscle weakness (generalized) ? ?Difficulty in walking, not elsewhere classified ? ?Cramp and spasm ? ?PERTINENT HISTORY: RA ? ?PRECAUTIONS: None ? ?SUBJECTIVE: I have done the exercises.  I've done the easy ones more ? ?PAIN:  ?Are you having pain? Yes: NPRS scale: 3/10 ?Pain location: Rt low back and buttock ?Pain description: sore, stiff  ?Aggravating factors: sitting  ?Relieving factors: Tramadol ? ?OBJECTIVE:  from eval on 08/09/21 ?  ?DIAGNOSTIC FINDINGS:  ?none ?  ?PATIENT SURVEYS:  ?FOTO 73 ?  ?SCREENING FOR RED FLAGS: ?Bowel or bladder incontinence: No ?Spinal tumors: No ?Cauda equina syndrome: No ?Compression fracture: No ?Abdominal aneurysm: No ?  ?COGNITION: ?  Overall cognitive status: Within functional limits for tasks assessed               ?           ?SENSATION: ?WFL ?  ?MUSCLE LENGTH: ?Hamstrings: Right 50 deg; Left 40 deg ?Thomas test: Right pos; Left pos ?  ?POSTURE:  ?No observable abnormalities ?  ?  ?LUMBAR ROM:  ?  ?Active  A/PROM  ?08/09/2021  ?Flexion Fingertiips to mid shin  ?Extension wnl  ?Right lateral flexion To joint line  ?Left lateral flexion To just above joint line  ?Right rotation wnl  ?Left rotation wnl  ? (Blank rows = not tested) ?  ?LE ROM: ?  ?WNL ?LE MMT: ?  ?Generally 4 to 4+/5 ?  ?LUMBAR SPECIAL TESTS:  ?Slump test: Negative and  FABER test: Negative ?  ?  GAIT: ?Distance walked: 50 ?Assistive device utilized: None ?Level of assistance: Complete Independence ?Comments: antalgic ?  ?  ? ?TODAY'S TREATMENT  ?08/09/21: Initial eval completed and initiated HEP ?Treatment on date: 08/14/21 ?NuStep: Level 1 x 6 minutes- PT present to discuss progress ?Hamstring stretch on step and strap: 3x20 seconds each ?ITB stretch with strap 3x20 seconds  ?Piriformis: seated and supine: 3x20 seconds each ? Trigger Point Dry-Needling  ?Treatment instructions: Expect mild to moderate muscle soreness. S/S of pneumothorax if dry needled over a lung field, and to seek immediate medical attention should they occur. Patient verbalized understanding of these instructions and education. ? ?Patient Consent Given: Yes ?Education handout provided: Yes ?Muscles treated: bil. lumbar multifidi and Rt gluteals  ?Treatment response/outcome: twitch response and improved tissue mobility   ? ?Skilled palpation and monitoring by PT during dry needling  ?  ?  ?PATIENT EDUCATION:  ?Education details: [email or web address], DN info (08/14/21) ?Person educated: Patient ?Education method: Explanation, Demonstration, Verbal cues, and Handouts ?Education comprehension: verbalized understanding, returned demonstration, and verbal cues required ?  ?  ?HOME EXERCISE PROGRAM: ?Access Code: [email or web address] ?URL: [email or web address] ?Date: 08/09/2021 ?Prepared by: Mikey Kirschner ?  ?Exercises ?- Standing Hamstring Stretch on Chair  - 2 x daily - 7 x weekly - 1 sets - 3 reps - 30 hold ?- Materials engineer with Table and Chair Support  - 2 x daily - 7 x weekly - 1 sets - 3 reps - 30 hold ?- Supine Hamstring Stretch with Strap  - 2 x daily - 7 x weekly - 1 sets - 3 reps - 30 hold ?- Supine ITB Stretch with Strap  - 2 x daily - 7 x weekly - 1 sets - 3 reps - 30 hold ?- Supine Piriformis Stretch Pulling Heel to Hip  - 2 x daily - 7 x weekly - 1 sets - 3 reps - 30 hold ?- Seated Piriformis Stretch with  Trunk Bend  - 2 x daily - 7 x weekly - 1 sets - 3 reps - 30 hold ?  ?ASSESSMENT: ?  ?CLINICAL IMPRESSION: ?First time follow-up after evaluation today.  Session spent reviewing HEP for flexibility.  Pt was able to demonstrate all aspects correctly.  Pt with tension and trigger points in Rt gluteals and bil lumbar multifidi.  Pt with good response to DN with twitch response and improved tissue mobility.   Pt will continue to benefit from skilled PT to address Rt sided lumbar pain, improve flexibility, core strength and tissue mobility.   ?  ?  ?OBJECTIVE IMPAIRMENTS cardiopulmonary status limiting activity, difficulty walking, decreased ROM, decreased strength, increased fascial  restrictions, increased muscle spasms, impaired flexibility, improper body mechanics, postural dysfunction, and pain.  ?  ?ACTIVITY LIMITATIONS cleaning, community activity, driving, meal prep, occupation, laundry, yard work, and shopping.  ?  ?PERSONAL FACTORS Fitness, Past/current experiences, Profession, Social background, Time since onset of injury/illness/exacerbation, and 1-2 comorbidities: cardiomyopathy, CHF, RA  are also affecting patient's functional outcome.  ?  ?  ?REHAB POTENTIAL: Good ?  ?CLINICAL DECISION MAKING: Evolving/moderate complexity ?  ?EVALUATION COMPLEXITY: Moderate ?  ?  ?GOALS: ?Goals reviewed with patient? Yes ?  ?SHORT TERM GOALS: Target date: 09/06/2021 ?  ?Patient will be independent with initial HEP  ?Baseline: ?Goal status: INITIAL ?  ?2.  Pain report to be no greater than 4/10  ?Baseline:  ?Goal status: INITIAL ?  ?3.  Patient to be able to complete 9 holes of golf without low back pain. ?Baseline:  ?Goal status: INITIAL ?  ?  ?LONG TERM GOALS: Target date: 10/04/2021 ?  ?Patient to be independent with advanced HEP  ?Baseline:  ?Goal status: INITIAL ?  ?2.  Patient to report pain no greater than 2/10  ?Baseline:  ?Goal status: INITIAL ?  ?3.  Patient be be able to sit to do work for greater than 30  minutes ?Baseline:  ?Goal status: INITIAL ?  ?4.  Patient to be able to play full round of golf without exacerbation of pain ?Baseline:  ?Goal status: INITIAL ?  ?  ?  ?PLAN: ?PT FREQUENCY: 2x/week ?  ?PT DURATION: 8 weeks

## 2021-08-14 NOTE — Patient Instructions (Signed)

## 2021-08-15 DIAGNOSIS — E291 Testicular hypofunction: Secondary | ICD-10-CM | POA: Diagnosis not present

## 2021-08-21 DIAGNOSIS — E291 Testicular hypofunction: Secondary | ICD-10-CM | POA: Diagnosis not present

## 2021-08-22 ENCOUNTER — Ambulatory Visit: Payer: BC Managed Care – PPO

## 2021-08-22 DIAGNOSIS — M5441 Lumbago with sciatica, right side: Secondary | ICD-10-CM | POA: Diagnosis not present

## 2021-08-22 DIAGNOSIS — M6281 Muscle weakness (generalized): Secondary | ICD-10-CM

## 2021-08-22 DIAGNOSIS — M5459 Other low back pain: Secondary | ICD-10-CM

## 2021-08-22 DIAGNOSIS — R262 Difficulty in walking, not elsewhere classified: Secondary | ICD-10-CM

## 2021-08-22 DIAGNOSIS — R252 Cramp and spasm: Secondary | ICD-10-CM

## 2021-08-22 NOTE — Therapy (Signed)
?OUTPATIENT PHYSICAL THERAPY TREATMENT NOTE ? ? ?Patient Name: Christopher Yu ?MRN: [id number] ?DOB:December 15, 1960, 61 y.o., male ?Today's Date: 08/22/2021 ? ?PCP: Kirby Funk, MD ?REFERRING PROVIDER: Kirby Funk, MD ? ?END OF SESSION:  ? PT End of Session - 08/22/21 0801   ? ? Visit Number 3   ? Authorization Type BCBS   ? PT Start Time 0802   ? PT Stop Time 0840   ? PT Time Calculation (min) 38 min   ? Activity Tolerance Patient tolerated treatment well   ? Behavior During Therapy [facility] for tasks assessed/performed   ? ?  ?  ? ?  ? ? ?Past Medical History:  ?Diagnosis Date  ? Arthritis, rheumatoid (HCC)   ? dx 1988  ? CAD (coronary artery disease), native coronary artery   ? Mild LAD disease with calcification noted at cath 12//27/16   ? Cardiomyopathy (HCC)   ? Chronic systolic heart failure (HCC) 05/01/2015  ? Deviated nasal septum 06/25/2011  ? Hyperlipidemia   ? Impingement syndrome of left shoulder 12/22/2017  ? Rheumatoid aortitis   ? Sleep apnea   ? does not use cpap - UNABLE TO TOLERATE MASK  ? Sleep apnea in adult   ? Status post total right knee replacement 06/01/2015  ? ?Past Surgical History:  ?Procedure Laterality Date  ? ANKLE FUSION Right 2014  ? related to his arthritis  ? ANKLE FUSION Left 05/25/13  ? related to his arthritis  ? CARDIAC CATHETERIZATION N/A 05/01/2015  ? Procedure: Left Heart Cath and Coronary Angiography;  Surgeon: Lyn Records, MD;  Location: [facility] INVASIVE CV LAB;  Service: Cardiovascular;  Laterality: N/A;  ? FRACTURE SURGERY    ? left femur fracture x 3  ? KNEE ARTHROSCOPY    ? right x 4  ? NASAL SEPTOPLASTY W/ TURBINOPLASTY  06/25/2011  ? Procedure: NASAL SEPTOPLASTY WITH TURBINATE REDUCTION;  Surgeon: Osborn Coho, MD;  Location: [facility] OR;  Service: ENT;  Laterality: Bilateral;  ? TONSILLECTOMY    ? TOTAL KNEE ARTHROPLASTY Right 06/01/2015  ? Procedure: RIGHT TOTAL KNEE ARTHROPLASTY;  Surgeon: Kathryne Hitch, MD;  Location: [facility] ORS;  Service: Orthopedics;   Laterality: Right;  Block+general  ? ?Patient Active Problem List  ? Diagnosis Date Noted  ? Impingement syndrome of left shoulder 12/22/2017  ? Impingement syndrome of right shoulder 12/22/2017  ? Rheumatoid arthritis involving right knee (HCC) 06/01/2015  ? Status post total right knee replacement 06/01/2015  ? Cardiomyopathy (HCC) 05/02/2015  ? Hyperlipidemia   ? Sleep apnea in adult   ? Rheumatoid arthritis (HCC)   ? Chronic systolic heart failure (HCC) 05/01/2015  ? CAD (coronary artery disease), native coronary artery   ? ? ?REFERRING DIAG: M54.41 (ICD-10-CM) - Lumbago with sciatica, right side  ? ?THERAPY DIAG:  ?Other low back pain ? ?Muscle weakness (generalized) ? ?Difficulty in walking, not elsewhere classified ? ?Cramp and spasm ? ?PERTINENT HISTORY: RA ? ?PRECAUTIONS: None ? ?SUBJECTIVE: Patient states he is doing well.  He reports the pain is 3/10.  No problems with DN.   ? ?PAIN:  ?Are you having pain? Yes: NPRS scale: 3/10 ?Pain location: Rt low back and buttock ?Pain description: sore, stiff  ?Aggravating factors: sitting  ?Relieving factors: Tramadol ? ?OBJECTIVE:  from eval on 08/09/21 ?  ?DIAGNOSTIC FINDINGS:  ?none ?  ?PATIENT SURVEYS:  ?FOTO 99 ?  ?SCREENING FOR RED FLAGS: ?Bowel or bladder incontinence: No ?Spinal tumors: No ?Cauda equina syndrome: No ?Compression fracture: No ?Abdominal aneurysm:  No ?  ?COGNITION: ?          Overall cognitive status: Within functional limits for tasks assessed               ?           ?SENSATION: ?WFL ?  ?MUSCLE LENGTH: ?Hamstrings: Right 50 deg; Left 40 deg ?Thomas test: Right pos; Left pos ?  ?POSTURE:  ?No observable abnormalities ?  ?  ?LUMBAR ROM:  ?  ?Active  A/PROM  ?08/09/2021  ?Flexion Fingertiips to mid shin  ?Extension wnl  ?Right lateral flexion To joint line  ?Left lateral flexion To just above joint line  ?Right rotation wnl  ?Left rotation wnl  ? (Blank rows = not tested) ?  ?LE ROM: ?  ?WNL ?LE MMT: ?  ?Generally 4 to 4+/5 ?  ?LUMBAR SPECIAL  TESTS:  ?Slump test: Negative and FABER test: Negative ?  ?  GAIT: ?Distance walked: 50 ?Assistive device utilized: None ?Level of assistance: Complete Independence ?Comments: antalgic ?  ?  ? ?TODAY'S TREATMENT  ?Treatment on date: 08/22/21 ?NuStep: Level 5 x 5 minutes- PT present to discuss progress ?Hamstring stretch on step  3x30 seconds each ?Quad/hip flexor stretch at steps with chair behind with balance pad in chair 3 x 30 sec ?Piriformis: seated and supine: 3x10 seconds each ?Supine butterfly stretch 5 x 10 sec ?PPT x 20 ?PPT with dying bug x 20 ?PPT with clam (blue loop) x 20 ?PPT with bridge + clam (blue loop) x 20 ? ? ?Treatment on date: 08/14/21 ?NuStep: Level 1 x 6 minutes- PT present to discuss progress ?Hamstring stretch on step and strap: 3x20 seconds each ?ITB stretch with strap 3x20 seconds  ?Piriformis: seated and supine: 3x20 seconds each ? Trigger Point Dry-Needling  ?Treatment instructions: Expect mild to moderate muscle soreness. S/S of pneumothorax if dry needled over a lung field, and to seek immediate medical attention should they occur. Patient verbalized understanding of these instructions and education. ? ?Patient Consent Given: Yes ?Education handout provided: Yes ?Muscles treated: bil. lumbar multifidi and Rt gluteals  ?Treatment response/outcome: twitch response and improved tissue mobility   ? ?Skilled palpation and monitoring by PT during dry needling  ? ? 08/09/21: Initial eval completed and initiated HEP ?  ?PATIENT EDUCATION:  ?Education details: [email or web address], DN info (08/14/21) ?Person educated: Patient ?Education method: Explanation, Demonstration, Verbal cues, and Handouts ?Education comprehension: verbalized understanding, returned demonstration, and verbal cues required ?  ?  ?HOME EXERCISE PROGRAM: ?Access Code: [email or web address] ?URL: [email or web address] ?Date: 08/22/2021 ?Prepared by: Mikey Kirschner ? ?Exercises ?- Standing Hamstring Stretch on Chair  - 2 x daily - 7 x  weekly - 1 sets - 3 reps - 30 hold ?- Materials engineer with Table and Chair Support  - 2 x daily - 7 x weekly - 1 sets - 3 reps - 30 hold ?- Supine Hamstring Stretch with Strap  - 2 x daily - 7 x weekly - 1 sets - 3 reps - 30 hold ?- Supine ITB Stretch with Strap  - 2 x daily - 7 x weekly - 1 sets - 3 reps - 30 hold ?- Supine Piriformis Stretch Pulling Heel to Hip  - 2 x daily - 7 x weekly - 1 sets - 3 reps - 30 hold ?- Seated Piriformis Stretch with Trunk Bend  - 2 x daily - 7 x weekly - 1 sets - 3 reps - 30 hold ?- Supine Posterior Pelvic Tilt  -  2 x daily - 7 x weekly - 1 sets - 20 reps ?- Supine Dead Bug with Leg Extension  - 2 x daily - 7 x weekly - 2 sets - 10 reps ?- Dead Bug with [employer]  - 2 x daily - 7 x weekly - 2 sets - 10 reps ?- Supine Bridge  - 2 x daily - 7 x weekly - 1 sets - 20 reps ?- Bridge with Hip Abduction and Resistance  - 2 x daily - 7 x weekly - 2 sets - 10 reps ?  ?ASSESSMENT: ?  ?CLINICAL IMPRESSION: ?Patient was able to tolerate addition of core strengthening with mod fatigue.  He needed rest break after 10 reps on each to maintain pelvic tilt.  On observation, he appears to have more pronounced hip IR and adduction when ambulating.  We will add in hip strengthening to address this next visit.     Pt will continue to benefit from skilled PT to address Rt sided lumbar pain, improve flexibility, core strength and tissue mobility.   ?  ?  ?OBJECTIVE IMPAIRMENTS cardiopulmonary status limiting activity, difficulty walking, decreased ROM, decreased strength, increased fascial restrictions, increased muscle spasms, impaired flexibility, improper body mechanics, postural dysfunction, and pain.  ?  ?ACTIVITY LIMITATIONS cleaning, community activity, driving, meal prep, occupation, laundry, yard work, and shopping.  ?  ?PERSONAL FACTORS Fitness, Past/current experiences, Profession, Social background, Time since onset of injury/illness/exacerbation, and 1-2 comorbidities: cardiomyopathy,  CHF, RA  are also affecting patient's functional outcome.  ?  ?  ?REHAB POTENTIAL: Good ?  ?CLINICAL DECISION MAKING: Evolving/moderate complexity ?  ?EVALUATION COMPLEXITY: Moderate ?  ?  ?GOALS: ?Goals reviewed with

## 2021-08-30 ENCOUNTER — Ambulatory Visit: Payer: BC Managed Care – PPO

## 2021-08-30 DIAGNOSIS — M6281 Muscle weakness (generalized): Secondary | ICD-10-CM

## 2021-08-30 DIAGNOSIS — R252 Cramp and spasm: Secondary | ICD-10-CM

## 2021-08-30 DIAGNOSIS — E291 Testicular hypofunction: Secondary | ICD-10-CM | POA: Diagnosis not present

## 2021-08-30 DIAGNOSIS — M5459 Other low back pain: Secondary | ICD-10-CM

## 2021-08-30 DIAGNOSIS — M5441 Lumbago with sciatica, right side: Secondary | ICD-10-CM | POA: Diagnosis not present

## 2021-08-30 DIAGNOSIS — R262 Difficulty in walking, not elsewhere classified: Secondary | ICD-10-CM

## 2021-08-30 NOTE — Therapy (Signed)
?OUTPATIENT PHYSICAL THERAPY TREATMENT NOTE ? ? ?Patient Name: Christopher MemoryJulian Josephlouis Yu ?MRN: [id number] ?DOB:[birth year], 61 y.o., male, male ?Today's Date: 08/30/2021 ? ?PCP: Kirby FunkGriffin, John, MD ?REFERRING PROVIDER: Kirby FunkGriffin, John, MD ? ?END OF SESSION:  ? PT End of Session - 08/30/21 0846   ? ? Visit Number 4   ? Authorization Type BCBS   ? PT Start Time [phone] [id number]   ? PT Stop Time 0927   ? PT Time Calculation (min) 41 min   ? Activity Tolerance Patient tolerated treatment well   ? Behavior [facility] for tasks assessed/performed   ? ?  ?  ? ?  ? ? ?Past Medical History:  ?Diagnosis Date  ? Arthritis, rheumatoid (HCC)   ? dx 1988  ? CAD (coronary artery disease), native coronary artery   ? Mild LAD disease with calcification noted at cath 12//27/16   ? Cardiomyopathy (HCC)   ? Chronic systolic heart failure (HCC) 05/01/2015  ? Deviated nasal septum 06/25/2011  ? Hyperlipidemia   ? Impingement syndrome of left shoulder 12/22/2017  ? Rheumatoid aortitis   ? Sleep apnea   ? does not use cpap - UNABLE TO TOLERATE MASK  ? Sleep apnea in adult   ? Status post total right knee replacement 06/01/2015  ? ?Past Surgical History:  ?Procedure Laterality Date  ? ANKLE FUSION Right 2014  ? related to his arthritis  ? ANKLE FUSION Left 05/25/13  ? related to his arthritis  ? CARDIAC CATHETERIZATION N/A 05/01/2015  ? Procedure: Left Heart Cath and Coronary Angiography;  Surgeon: Lyn RecordsHenry W Smith, MD;  [facility];  Service: Cardiovascular;  Laterality: N/A;  ? FRACTURE SURGERY    ? left femur fracture x 3  ? KNEE ARTHROSCOPY    ? right x 4  ? NASAL SEPTOPLASTY W/ TURBINOPLASTY  06/25/2011  ? Procedure: NASAL SEPTOPLASTY WITH TURBINATE REDUCTION;  Surgeon: Osborn Cohoavid Shoemaker, MD;  [facility];  Service: ENT;  Laterality: Bilateral;  ? TONSILLECTOMY    ? TOTAL KNEE ARTHROPLASTY Right 06/01/2015  ? Procedure: RIGHT TOTAL KNEE ARTHROPLASTY;  Surgeon: Kathryne Hitchhristopher Y Blackman, MD;  Location: [facility];  Service: Orthopedics;   Laterality: Right;  Block+general  ? ?Patient Active Problem List  ? Diagnosis Date Noted  ? Impingement syndrome of left shoulder 12/22/2017  ? Impingement syndrome of right shoulder 12/22/2017  ? Rheumatoid arthritis involving right knee (HCC) 06/01/2015  ? Status post total right knee replacement 06/01/2015  ? Cardiomyopathy (HCC) 05/02/2015  ? Hyperlipidemia   ? Sleep apnea in adult   ? Rheumatoid arthritis (HCC)   ? Chronic systolic heart failure (HCC) 05/01/2015  ? CAD (coronary artery disease), native coronary artery   ? ? ?REFERRING DIAG: M54.41 (ICD-10-CM) - Lumbago with sciatica, right side  ? ?THERAPY DIAG:  ?Other low back pain ? ?Muscle weakness (generalized) ? ?Difficulty in walking, not elsewhere classified ? ?Cramp and spasm ? ?PERTINENT HISTORY: RA ? ?PRECAUTIONS: None ? ?SUBJECTIVE: Patient states his arthritis is really bad today.  "Can we not do NuStep? I am really hurting.  I played golf on Sunday and it was bad the day after.  It has progressively he is doing well.  He reports the pain is 3/10.  No problems with DN.   ? ?PAIN:  ?Are you having pain? Yes: NPRS scale: 3/10 ?Pain location: Rt low back and buttock ?Pain description: sore, stiff  ?Aggravating factors: sitting  ?Relieving factors: Tramadol ? ?OBJECTIVE:  from eval on 08/09/21 ?  ?DIAGNOSTIC  FINDINGS:  ?none ?  ?PATIENT SURVEYS:  ?FOTO 45 ?  ?SCREENING FOR RED FLAGS: ?Bowel or bladder incontinence: No ?Spinal tumors: No ?Cauda equina syndrome: No ?Compression fracture: No ?Abdominal aneurysm: No ?  ?COGNITION: ?          Overall cognitive status: Within functional limits for tasks assessed               ?           ?SENSATION: ?WFL ?  ?MUSCLE LENGTH: ?Hamstrings: Right 50 deg; Left 40 deg ?Thomas test: Right pos; Left pos ?  ?POSTURE:  ?No observable abnormalities ?  ?  ?LUMBAR ROM:  ?  ?Active  A/PROM  ?08/09/2021  ?Flexion Fingertiips to mid shin  ?Extension wnl  ?Right lateral flexion To joint line  ?Left lateral flexion To just above  joint line  ?Right rotation wnl  ?Left rotation wnl  ? (Blank rows = not tested) ?  ?LE ROM: ?  ?WNL ?LE MMT: ?  ?Generally 4 to 4+/5 ?  ?LUMBAR SPECIAL TESTS:  ?Slump test: Negative and FABER test: Negative ?  ?  GAIT: ?Distance walked: 50 ?Assistive device utilized: None ?Level of assistance: Complete Independence ?Comments: antalgic ?  ?  ? ?TODAY'S TREATMENT  ?Treatment on date: 08/30/21 ?NuStep: Level 5 x 5 minutes- PT present to discuss progress (patient refused- states his arthritis is bad today) ?Supine hamstring stretch   3x30 seconds each ?Supine IT band stretch 3 x 30 seconds ea ?Piriformis: seated and supine: 3x10 seconds each ?Quad/hip flexor stretch at counter with chair behind (no balance pad in chair) 3 x 30 sec ?Lateral band walks 10 steps each way 3 laps (green loop) ?Seated clam x 20 (green loop) ?Supine butterfly stretch 5 x 10 sec ?Hooklying clam x 20 (green loop) ?PPT x 20 ?PPT with dying bug x 20 ?PPT with dying bug with physio ball x 20 ?Open book stretch: added to HEP for trunk and thoracic tightness ?Treatment on date: 08/22/21 ?NuStep: Level 5 x 5 minutes- PT present to discuss progress ?Hamstring stretch on step  3x30 seconds each ?Quad/hip flexor stretch at steps with chair behind with balance pad in chair 3 x 30 sec ?Piriformis: seated and supine: 3x10 seconds each ?Supine butterfly stretch 5 x 10 sec ?PPT x 20 ?PPT with dying bug x 20 ?PPT with clam (blue loop) x 20 ?PPT with bridge + clam (blue loop) x 20 ? ? ?Treatment on date: 08/14/21 ?NuStep: Level 1 x 6 minutes- PT present to discuss progress ?Hamstring stretch on step and strap: 3x20 seconds each ?ITB stretch with strap 3x20 seconds  ?Piriformis: seated and supine: 3x20 seconds each ? Trigger Point Dry-Needling  ?Treatment instructions: Expect mild to moderate muscle soreness. S/S of pneumothorax if dry needled over a lung field, and to seek immediate medical attention should they occur. Patient verbalized understanding of these  instructions and education. ? ?Patient Consent Given: Yes ?Education handout provided: Yes ?Muscles treated: bil. lumbar multifidi and Rt gluteals  ?Treatment response/outcome: twitch response and improved tissue mobility   ? ?Skilled palpation and monitoring by PT during dry needling  ? ? 08/09/21: Initial eval completed and initiated HEP ?  ?PATIENT EDUCATION:  ?Education details: [email or web address], DN info (08/14/21) ?Person educated: Patient ?Education method: Explanation, Demonstration, Verbal cues, and Handouts ?Education comprehension: verbalized understanding, returned demonstration, and verbal cues required ?  ?  ?HOME EXERCISE PROGRAM: ?Access Code: [email or web address] ?URL: [email or web address] ?Date: 08/22/2021 ?Prepared by: Mikey Kirschner ? ?Exercises ?-  Standing Hamstring Stretch on Chair  - 2 x daily - 7 x weekly - 1 sets - 3 reps - 30 hold ?- Materials engineer with Table and Chair Support  - 2 x daily - 7 x weekly - 1 sets - 3 reps - 30 hold ?- Supine Hamstring Stretch with Strap  - 2 x daily - 7 x weekly - 1 sets - 3 reps - 30 hold ?- Supine ITB Stretch with Strap  - 2 x daily - 7 x weekly - 1 sets - 3 reps - 30 hold ?- Supine Piriformis Stretch Pulling Heel to Hip  - 2 x daily - 7 x weekly - 1 sets - 3 reps - 30 hold ?- Seated Piriformis Stretch with Trunk Bend  - 2 x daily - 7 x weekly - 1 sets - 3 reps - 30 hold ?- Supine Posterior Pelvic Tilt  - 2 x daily - 7 x weekly - 1 sets - 20 reps ?- Supine Dead Bug with Leg Extension  - 2 x daily - 7 x weekly - 2 sets - 10 reps ?- Dead Bug with [employer]  - 2 x daily - 7 x weekly - 2 sets - 10 reps ?- Supine Bridge  - 2 x daily - 7 x weekly - 1 sets - 20 reps ?- Bridge with Hip Abduction and Resistance  - 2 x daily - 7 x weekly - 2 sets - 10 reps ?  ?ASSESSMENT: ?  ?CLINICAL IMPRESSION: ?Patient played golf on Sunday and has exacerbation of his arthritis.  He did not want to do NuStep but was able to complete all other tasks without pain.  He continues to  have very weak core and fatigues very easily with  Pt will continue to benefit from skilled PT to address Rt sided lumbar pain, improve flexibility, core strength and tissue mobility.   ?  ?  ?OBJECTIVE IMPAIRMENT

## 2021-09-04 DIAGNOSIS — M0579 Rheumatoid arthritis with rheumatoid factor of multiple sites without organ or systems involvement: Secondary | ICD-10-CM | POA: Diagnosis not present

## 2021-09-06 ENCOUNTER — Ambulatory Visit: Payer: BC Managed Care – PPO | Attending: Internal Medicine

## 2021-09-06 DIAGNOSIS — M5459 Other low back pain: Secondary | ICD-10-CM | POA: Diagnosis not present

## 2021-09-06 DIAGNOSIS — M6281 Muscle weakness (generalized): Secondary | ICD-10-CM

## 2021-09-06 DIAGNOSIS — R252 Cramp and spasm: Secondary | ICD-10-CM

## 2021-09-06 DIAGNOSIS — R262 Difficulty in walking, not elsewhere classified: Secondary | ICD-10-CM

## 2021-09-06 NOTE — Therapy (Addendum)
OUTPATIENT PHYSICAL THERAPY TREATMENT NOTE / DISCHARGE   Patient Name: Christopher Yu MRN: [id number] DOB:1960/07/02, 61 y.o., male Today's Date: 09/06/2021  PCP: Lavone Orn, MD REFERRING PROVIDER: Lavone Orn, MD  END OF SESSION:   PT End of Session - 09/06/21 0805     Visit Number 5    Date for PT Re-Evaluation 10/04/21    Authorization Type BCBS    PT Start Time 0800    PT Stop Time 5465    PT Time Calculation (min) 46 min             Past Medical History:  Diagnosis Date   Arthritis, rheumatoid ([place])    dx 1988   CAD (coronary artery disease), native coronary artery    Mild LAD disease with calcification noted at cath 12//27/16    Cardiomyopathy ([place])    Chronic systolic heart failure ([place]) 05/01/2015   Deviated nasal septum 06/25/2011   Hyperlipidemia    Impingement syndrome of left shoulder 12/22/2017   Rheumatoid aortitis    Sleep apnea    does not use cpap - UNABLE TO TOLERATE MASK   Sleep apnea in adult    Status post total right knee replacement 06/01/2015   Past Surgical History:  Procedure Laterality Date   ANKLE FUSION Right 2014   related to his arthritis   ANKLE FUSION Left 05/25/13   related to his arthritis   CARDIAC CATHETERIZATION N/A 05/01/2015   Procedure: Left Heart Cath and Coronary Angiography;  Surgeon: Belva Crome, MD;  Location: [place] [facility];  Service: Cardiovascular;  Laterality: N/A;   FRACTURE SURGERY     left femur fracture x 3   KNEE ARTHROSCOPY     right x 4   NASAL SEPTOPLASTY W/ TURBINOPLASTY  06/25/2011   Procedure: NASAL SEPTOPLASTY WITH TURBINATE REDUCTION;  Surgeon: Jerrell Belfast, MD;  Location: [facility];  Service: ENT;  Laterality: Bilateral;   TONSILLECTOMY     TOTAL KNEE ARTHROPLASTY Right 06/01/2015   Procedure: RIGHT TOTAL KNEE ARTHROPLASTY;  Surgeon: Mcarthur Rossetti, MD;  Location: [facility];  Service: Orthopedics;  Laterality: Right;  Block+general   Patient Active Problem List    Diagnosis Date Noted   Impingement syndrome of left shoulder 12/22/2017   Impingement syndrome of right shoulder 12/22/2017   Rheumatoid arthritis involving right knee ([place]) 06/01/2015   Status post total right knee replacement 06/01/2015   Cardiomyopathy ([place]) 05/02/2015   Hyperlipidemia    Sleep apnea in adult    Rheumatoid arthritis ([place])    Chronic systolic heart failure ([place]) 05/01/2015   CAD (coronary artery disease), native coronary artery     REFERRING DIAG: M54.41 (ICD-10-CM) - Lumbago with sciatica, right side   THERAPY DIAG:  Other low back pain  Muscle weakness (generalized)  Difficulty in walking, not elsewhere classified  Cramp and spasm  PERTINENT HISTORY: RA  PRECAUTIONS: None  SUBJECTIVE: Patient states he is doing better today.  He played a round of golf since last visit and did not have as much soreness.  He reports his pain at 2/10 today.    PAIN:  Are you having pain? Yes: NPRS scale: 2/10 Pain location: Rt low back and buttock Pain description: sore, stiff  Aggravating factors: sitting  Relieving factors: Tramadol  OBJECTIVE:  from eval on 08/09/21   DIAGNOSTIC FINDINGS:  none   PATIENT SURVEYS:  FOTO 53   SCREENING FOR RED FLAGS: Bowel or bladder incontinence: No Spinal tumors: No Cauda equina syndrome: No  Compression fracture: No Abdominal aneurysm: No   COGNITION:           Overall cognitive status: Within functional limits for tasks assessed                          SENSATION: WFL   MUSCLE LENGTH: Hamstrings: Right 50 deg; Left 40 deg Thomas test: Right pos; Left pos   POSTURE:  No observable abnormalities     LUMBAR ROM:    Active  A/PROM  08/09/2021  Flexion Fingertiips to mid shin  Extension wnl  Right lateral flexion To joint line  Left lateral flexion To just above joint line  Right rotation wnl  Left rotation wnl   (Blank rows = not tested)   LE ROM:   WNL LE MMT:   Generally 4 to 4+/5   LUMBAR SPECIAL  TESTS:  Slump test: Negative and FABER test: Negative     GAIT: Distance walked: 50 Assistive device utilized: None Level of assistance: Complete Independence Comments: antalgic      TODAY'S TREATMENT  Treatment on date: 09/06/21 NuStep: Level 5 x 5 minutes- PT present to discuss progress  Supine hamstring stretch   3x30 seconds each Supine IT band stretch 3 x 30 seconds ea PPT x 20 PPT with dying bug 2 x 10 PPT with dying bug with physio ball 2 x 10 Supine butterfly stretch 5 x 10 sec Hooklying clam x 20 (blue loop) Bridge with clam x 20 (blue loop) Lateral band walks 10 steps each way 3 laps (blue loop) Quad/hip flexor stretch at counter with chair behind (no balance pad in chair) 3 x 30 sec Piriformis: seated and supine: 3x10 seconds each  Treatment on date: 08/30/21 NuStep: Level 5 x 5 minutes- PT present to discuss progress (patient refused- states his arthritis is bad today) Supine hamstring stretch   3x30 seconds each Supine IT band stretch 3 x 30 seconds ea Piriformis: seated and supine: 3x10 seconds each Quad/hip flexor stretch at counter with chair behind (no balance pad in chair) 3 x 30 sec Lateral band walks 10 steps each way 3 laps (green loop) Seated clam x 20 (green loop) Supine butterfly stretch 5 x 10 sec Hooklying clam x 20 (green loop) PPT x 20 PPT with dying bug x 20 PPT with dying bug with physio ball x 20 Open book stretch: added to HEP for trunk and thoracic tightness Treatment on date: 08/22/21 NuStep: Level 5 x 5 minutes- PT present to discuss progress Hamstring stretch on step  3x30 seconds each Quad/hip flexor stretch at steps with chair behind with balance pad in chair 3 x 30 sec Piriformis: seated and supine: 3x10 seconds each Supine butterfly stretch 5 x 10 sec PPT x 20 PPT with dying bug x 20 PPT with clam (blue loop) x 20 PPT with bridge + clam (blue loop) x 20   Treatment on date: 08/14/21 NuStep: Level 1 x 6 minutes- PT present to  discuss progress Hamstring stretch on step and strap: 3x20 seconds each ITB stretch with strap 3x20 seconds  Piriformis: seated and supine: 3x20 seconds each  Trigger Point Dry-Needling  Treatment instructions: Expect mild to moderate muscle soreness. S/S of pneumothorax if dry needled over a lung field, and to seek immediate medical attention should they occur. Patient verbalized understanding of these instructions and education.  Patient Consent Given: Yes Education handout provided: Yes Muscles treated: bil. lumbar multifidi and Rt gluteals  Treatment  response/outcome: twitch response and improved tissue mobility    Skilled palpation and monitoring by PT during dry needling    08/09/21: Initial eval completed and initiated HEP   PATIENT EDUCATION:  Education details: [email or web address], DN info (08/14/21) Person educated: Patient Education method: Consulting civil engineer, Demonstration, Verbal cues, and Handouts Education comprehension: verbalized understanding, returned demonstration, and verbal cues required     HOME EXERCISE PROGRAM: Access Code: [email or web address] URL: [email or web address] Date: 08/22/2021 Prepared by: Candyce Churn  Exercises - Standing Hamstring Stretch on Chair  - 2 x daily - 7 x weekly - 1 sets - 3 reps - 30 hold - Standing Quad Stretch with Table and Chair Support  - 2 x daily - 7 x weekly - 1 sets - 3 reps - 30 hold - Supine Hamstring Stretch with Strap  - 2 x daily - 7 x weekly - 1 sets - 3 reps - 30 hold - Supine ITB Stretch with Strap  - 2 x daily - 7 x weekly - 1 sets - 3 reps - 30 hold - Supine Piriformis Stretch Pulling Heel to Hip  - 2 x daily - 7 x weekly - 1 sets - 3 reps - 30 hold - Seated Piriformis Stretch with Trunk Bend  - 2 x daily - 7 x weekly - 1 sets - 3 reps - 30 hold - Supine Posterior Pelvic Tilt  - 2 x daily - 7 x weekly - 1 sets - 20 reps - Supine Dead Bug with Leg Extension  - 2 x daily - 7 x weekly - 2 sets - 10 reps - Dead Bug with Swiss  Ball  - 2 x daily - 7 x weekly - 2 sets - 10 reps - Supine Bridge  - 2 x daily - 7 x weekly - 1 sets - 20 reps - Bridge with Hip Abduction and Resistance  - 2 x daily - 7 x weekly - 2 sets - 10 reps   ASSESSMENT:   CLINICAL IMPRESSION: Patient is progressing appropriately.  Compliance with HEP is good.  He does seem to be doing some stretches prior to playing golf.  He was able to do core strengthening with improved control and maintaining neutral pelvis but still needs rest break at 10 to complete 20 reps total. He should continue to improve.   We will continue to encourage consistency with HEP.    Pt will continue to benefit from skilled PT to address Rt sided lumbar pain, improve flexibility, core strength and tissue mobility.       OBJECTIVE IMPAIRMENTS cardiopulmonary status limiting activity, difficulty walking, decreased ROM, decreased strength, increased fascial restrictions, increased muscle spasms, impaired flexibility, improper body mechanics, postural dysfunction, and pain.    ACTIVITY LIMITATIONS cleaning, community activity, driving, meal prep, occupation, laundry, yard work, and shopping.    PERSONAL FACTORS Fitness, Past/current experiences, Profession, Social background, Time since onset of injury/illness/exacerbation, and 1-2 comorbidities: cardiomyopathy, CHF, RA  are also affecting patient's functional outcome.      REHAB POTENTIAL: Good   CLINICAL DECISION MAKING: Evolving/moderate complexity   EVALUATION COMPLEXITY: Moderate     GOALS: Goals reviewed with patient? Yes   SHORT TERM GOALS: Target date: 09/06/2021   Patient will be independent with initial HEP  Baseline: Goal status: MET   2.  Pain report to be no greater than 4/10  Baseline:  Goal status: Met 08/22/21   3.  Patient to be able to complete 9 holes of golf without low back pain. Baseline:  Goal status: Met 09/05/21     LONG TERM GOALS: Target date: 10/04/2021   Patient to be independent with  advanced HEP  Baseline:  Goal status: INITIAL   2.  Patient to report pain no greater than 2/10  Baseline:  Goal status: INITIAL   3.  Patient be be able to sit to do work for greater than 30 minutes Baseline:  Goal status: INITIAL   4.  Patient to be able to play full round of golf without exacerbation of pain Baseline:  Goal status: INITIAL       PLAN: PT FREQUENCY: 2x/week   PT DURATION: 8 weeks   PLANNED INTERVENTIONS: Therapeutic exercises, Therapeutic activity, Neuromuscular re-education, Balance training, Gait training, Patient/Family education, Joint mobilization, Stair training, Aquatic Therapy, Dry Needling, Electrical stimulation, Spinal mobilization, Cryotherapy, Moist heat, Taping, Traction, Ultrasound, Ionotophoresis 37m/ml Dexamethasone, and Manual therapy.   PLAN FOR NEXT SESSION: NuStep, DN if indicated, progress core ex , progress hip abductor and ER strengthening (band walks, side lying clam)    PHYSICAL THERAPY DISCHARGE SUMMARY  Visits from Start of Care: 5  Current functional level related to goals / functional outcomes: SEE ABOVE   Remaining deficits: SEE ABOVE   Education / Equipment: SEE ABOVE   Patient agrees to discharge. Patient goals were partially met. Patient is being discharged due to the patient's request.  JAnderson MaltaB. Tali Cleaves, PT 09/25/21 10:05 AM (DC ADDENDUM)  JAnderson MaltaB. Goldie Dimmer, PT 09/06/21 8:47 AM

## 2021-09-13 DIAGNOSIS — E291 Testicular hypofunction: Secondary | ICD-10-CM | POA: Diagnosis not present

## 2021-09-16 ENCOUNTER — Telehealth: Payer: Self-pay

## 2021-09-16 NOTE — Telephone Encounter (Signed)
Call placed to patient due to multiple cancellations.  Patient explains that he has been sick ever since returning from a business trip and has had to cancel a few visits but would like to continue his therapy.   ?

## 2021-09-19 DIAGNOSIS — M0579 Rheumatoid arthritis with rheumatoid factor of multiple sites without organ or systems involvement: Secondary | ICD-10-CM | POA: Diagnosis not present

## 2021-09-27 DIAGNOSIS — E291 Testicular hypofunction: Secondary | ICD-10-CM | POA: Diagnosis not present

## 2021-10-03 DIAGNOSIS — R0982 Postnasal drip: Secondary | ICD-10-CM | POA: Diagnosis not present

## 2021-10-03 DIAGNOSIS — R053 Chronic cough: Secondary | ICD-10-CM | POA: Diagnosis not present

## 2021-10-11 DIAGNOSIS — E291 Testicular hypofunction: Secondary | ICD-10-CM | POA: Diagnosis not present

## 2021-10-30 DIAGNOSIS — Z5181 Encounter for therapeutic drug level monitoring: Secondary | ICD-10-CM | POA: Diagnosis not present

## 2021-10-30 DIAGNOSIS — M818 Other osteoporosis without current pathological fracture: Secondary | ICD-10-CM | POA: Diagnosis not present

## 2021-10-30 DIAGNOSIS — E291 Testicular hypofunction: Secondary | ICD-10-CM | POA: Diagnosis not present

## 2021-10-30 DIAGNOSIS — E229 Hyperfunction of pituitary gland, unspecified: Secondary | ICD-10-CM | POA: Diagnosis not present

## 2021-11-19 DIAGNOSIS — E221 Hyperprolactinemia: Secondary | ICD-10-CM | POA: Diagnosis not present

## 2021-11-27 ENCOUNTER — Other Ambulatory Visit: Payer: Self-pay | Admitting: Internal Medicine

## 2021-11-27 DIAGNOSIS — E221 Hyperprolactinemia: Secondary | ICD-10-CM

## 2021-12-08 ENCOUNTER — Ambulatory Visit
Admission: RE | Admit: 2021-12-08 | Discharge: 2021-12-08 | Disposition: A | Discharge status: Home / Self Care | Payer: BC Managed Care – PPO | Source: Ambulatory Visit | Attending: Internal Medicine | Admitting: Internal Medicine

## 2021-12-08 DIAGNOSIS — E291 Testicular hypofunction: Secondary | ICD-10-CM | POA: Diagnosis not present

## 2021-12-08 DIAGNOSIS — E221 Hyperprolactinemia: Secondary | ICD-10-CM

## 2021-12-08 DIAGNOSIS — J341 Cyst and mucocele of nose and nasal sinus: Secondary | ICD-10-CM | POA: Diagnosis not present

## 2021-12-08 MED ORDER — GADOBENATE DIMEGLUMINE 529 MG/ML IV SOLN
20.0000 mL | Freq: Once | INTRAVENOUS | Status: AC | PRN
  Administered 2021-12-08: 20 mL via INTRAVENOUS

## 2021-12-24 DIAGNOSIS — M79643 Pain in unspecified hand: Secondary | ICD-10-CM | POA: Diagnosis not present

## 2021-12-24 DIAGNOSIS — M0579 Rheumatoid arthritis with rheumatoid factor of multiple sites without organ or systems involvement: Secondary | ICD-10-CM | POA: Diagnosis not present

## 2021-12-24 DIAGNOSIS — I429 Cardiomyopathy, unspecified: Secondary | ICD-10-CM | POA: Diagnosis not present

## 2021-12-24 DIAGNOSIS — Z79899 Other long term (current) drug therapy: Secondary | ICD-10-CM | POA: Diagnosis not present

## 2021-12-25 DIAGNOSIS — Q049 Congenital malformation of brain, unspecified: Secondary | ICD-10-CM | POA: Diagnosis not present

## 2021-12-30 ENCOUNTER — Other Ambulatory Visit: Payer: Self-pay | Admitting: Neurosurgery

## 2021-12-30 DIAGNOSIS — E291 Testicular hypofunction: Secondary | ICD-10-CM | POA: Diagnosis not present

## 2021-12-30 DIAGNOSIS — Z125 Encounter for screening for malignant neoplasm of prostate: Secondary | ICD-10-CM | POA: Diagnosis not present

## 2021-12-30 DIAGNOSIS — Q049 Congenital malformation of brain, unspecified: Secondary | ICD-10-CM

## 2021-12-31 ENCOUNTER — Other Ambulatory Visit: Payer: Self-pay | Admitting: Neurosurgery

## 2022-01-13 NOTE — Progress Notes (Deleted)
Office Visit    Patient Name: Christopher Yu Date of Encounter: 01/13/2022  Primary Care Provider:  Kirby Funk, MD Primary Cardiologist:  Lance Muss, MD Primary Electrophysiologist: None  Chief Complaint    Christopher Yu is a 61 y.o. male with PMH of rheumatoid arthritis, nonobstructive CAD, HFrEF, HLD, OSA (unable to tolerate mask) and ICM who presents today for 1 year follow-up of CHF.  Past Medical History    Past Medical History:  Diagnosis Date   Arthritis, rheumatoid (HCC)    dx 1988   CAD (coronary artery disease), native coronary artery    Mild LAD disease with calcification noted at cath 12//27/16    Cardiomyopathy [facility])    Chronic systolic heart failure (HCC) 05/01/2015   Deviated nasal septum 06/25/2011   Hyperlipidemia    Impingement syndrome of left shoulder 12/22/2017   Rheumatoid aortitis    Sleep apnea    does not use cpap - UNABLE TO TOLERATE MASK   Sleep apnea in adult    Status post total right knee replacement 06/01/2015   Past Surgical History:  Procedure Laterality Date   ANKLE FUSION Right 2014   related to his arthritis   ANKLE FUSION Left 05/25/13   related to his arthritis   CARDIAC CATHETERIZATION N/A 05/01/2015   Procedure: Left Heart Cath and Coronary Angiography;  Surgeon: Lyn Records, MD;  Location: [facility] INVASIVE CV LAB;  Service: Cardiovascular;  Laterality: N/A;   FRACTURE SURGERY     left femur fracture x 3   KNEE ARTHROSCOPY     right x 4   NASAL SEPTOPLASTY W/ TURBINOPLASTY  06/25/2011   Procedure: NASAL SEPTOPLASTY WITH TURBINATE REDUCTION;  Surgeon: Osborn Coho, MD;  Location: [facility] OR;  Service: ENT;  Laterality: Bilateral;   TONSILLECTOMY     TOTAL KNEE ARTHROPLASTY Right 06/01/2015   Procedure: RIGHT TOTAL KNEE ARTHROPLASTY;  Surgeon: Kathryne Hitch, MD;  Location: [facility] ORS;  Service: Orthopedics;  Laterality: Right;  Block+general    Allergies  No Known Allergies  History of  Present Illness    Christopher Yu has a PMH of is a 61 year old male with the above mention past medical history who presents today for follow-up of congestive heart failure.  Mr. Rockel was initially seen by Dr. Donnie Aho in 2016 for preoperative clearance of total knee replacement.  He was noted to have abnormal EKG with T wave inversion in precordial lead.  He was sent for myocardial perfusion scan that showed global hypokinesis and fixed inferior wall defect.  He was then sent for 2D echo that confirmed EF of 40% with global hypokinesis, and aortic regurgitation.  He underwent LHC by Dr. Katrinka Blazing on 04/2015 that showed minimal plaquing in the LAD with nondominant right and mildly dilated hypocontractile left ventricle with EF estimated 35-40%.  He was started on GDMT for management of CHF.  He was last seen on 10/2020 and was euvolemic on examination.  LDL was elevated at 94 and patient was tolerating rosuvastatin without myalgias.  He denied any angina was tolerating medications with no side effects.   Since last being seen in the office patient reports***.  Patient denies chest pain, palpitations, dyspnea, PND, orthopnea, nausea, vomiting, dizziness, syncope, edema, weight gain, or early satiety.     ***Notes:  Home Medications    Current Outpatient Medications  Medication Sig Dispense Refill   aspirin 81 MG tablet Take 81 mg by mouth daily.     folic  acid (FOLVITE) 1 MG tablet Take 1 mg by mouth daily.     leflunomide (ARAVA) 20 MG tablet Take 20 mg by mouth daily.     lisinopril (ZESTRIL) 10 MG tablet Take 1 tablet (10 mg total) by mouth daily. 90 tablet 2   methotrexate (RHEUMATREX) 2.5 MG tablet Take 20 mg by mouth once a week. Takes 8 tablets(20mg ) every Saturday.     Caution:Chemotherapy. Protect from light.     metoprolol succinate (TOPROL-XL) 25 MG 24 hr tablet Take 1 tablet (25 mg total) by mouth daily. 90 tablet 2   Multiple Vitamins-Minerals (PRESERVISION AREDS PO) Take  2 capsules by mouth daily.     predniSONE (DELTASONE) 5 MG tablet Take 5 mg by mouth daily.     RITUXAN 500 MG/50ML injection      rosuvastatin (CRESTOR) 10 MG tablet Take 1 tablet (10 mg total) by mouth daily. 90 tablet 2   sertraline (ZOLOFT) 50 MG tablet Take 50 mg by mouth daily.     spironolactone (ALDACTONE) 25 MG tablet Take 1 tablet (25 mg total) by mouth daily. 90 tablet 2   sulfaSALAzine (AZULFIDINE) 500 MG tablet Take 1,000 mg by mouth 2 (two) times daily.      traMADol (ULTRAM) 50 MG tablet Take 50 mg by mouth as needed.     No current facility-administered medications for this visit.     Review of Systems  Please see the history of present illness.    (+)*** (+)***  All other systems reviewed and are otherwise negative except as noted above.  Physical Exam    Wt Readings from Last 3 Encounters:  10/25/20 251 lb 9.6 oz (114.1 kg)  09/01/19 243 lb 6.4 oz (110.4 kg)  08/20/18 243 lb (110.2 kg)   AJ:OINOM were no vitals filed for this visit.,There is no height or weight on file to calculate BMI.  Constitutional:      Appearance: Healthy appearance. Not in distress.  Neck:     Vascular: JVD normal.  Pulmonary:     Effort: Pulmonary effort is normal.     Breath sounds: No wheezing. No rales. Diminished in the bases Cardiovascular:     Normal rate. Regular rhythm. Normal S1. Normal S2.      Murmurs: There is no murmur.  Edema:    Peripheral edema absent.  Abdominal:     Palpations: Abdomen is soft non tender. There is no hepatomegaly.  Skin:    General: Skin is warm and dry.  Neurological:     General: No focal deficit present.     Mental Status: Alert and oriented to person, place and time.     Cranial Nerves: Cranial nerves are intact.  EKG/LABS/Other Studies Reviewed    ECG personally reviewed by me today - ***  Risk Assessment/Calculations:   {Does this patient have ATRIAL FIBRILLATION?:[phone]}        Lab Results  Component Value Date   WBC  10.4 06/02/2015   HGB 10.2 (L) 06/02/2015   HCT 31.2 (L) 06/02/2015   MCV 92.6 06/02/2015   PLT 215 06/02/2015   Lab Results  Component Value Date   CREATININE 1.01 06/02/2015   BUN 14 06/02/2015   NA 137 06/02/2015   K 4.1 06/02/2015   CL 103 06/02/2015   CO2 26 06/02/2015   No results found for: "ALT", "AST", "GGT", "ALKPHOS", "BILITOT" No results found for: "CHOL", "HDL", "LDLCALC", "LDLDIRECT", "TRIG", "CHOLHDL"  No results found for: "HGBA1C"  Assessment & Plan  1.  HFrEF: -2D echo completed 2020 with moderate LVH and EF of 40% -Current GDMT with Aldactone 25 mg, Toprol-XL 25 mg, lisinopril 10 mg  2.  NICM: -Today patient reports*** -He is***on exam  3.  Hyperlipidemia: -Patient's last LDL cholesterol was*** -Continue Crestor 10 mg daily  4.  Nonobstructive CAD: -s/p LHC 2016 that showed minimal plaquing in the LAD with nondominant right and mildly dilated hypocontractile left ventricle with EF estimated 35-40%.  -Current GDMT with ASA 81 mg, Crestor 10 mg     Disposition: Follow-up with Lance Muss, MD or APP in *** months {Are you ordering a CV Procedure (e.g. stress test, cath, DCCV, TEE, etc)?   Press F2        :[id number]}   Medication Adjustments/Labs and Tests Ordered: Current medicines are reviewed at length with the patient today.  Concerns regarding medicines are outlined above.   Signed, Napoleon Form, Leodis Rains, NP 01/13/2022, 7:04 AM [facility]

## 2022-01-16 ENCOUNTER — Ambulatory Visit: Payer: BC Managed Care – PPO | Admitting: Nurse Practitioner

## 2022-01-17 DIAGNOSIS — E291 Testicular hypofunction: Secondary | ICD-10-CM | POA: Diagnosis not present

## 2022-01-28 ENCOUNTER — Other Ambulatory Visit: Payer: Self-pay

## 2022-01-28 ENCOUNTER — Ambulatory Visit (HOSPITAL_COMMUNITY)
Admission: RE | Admit: 2022-01-28 | Discharge: 2022-01-28 | Disposition: A | Discharge status: Home / Self Care | Payer: BC Managed Care – PPO | Source: Ambulatory Visit | Attending: Neurosurgery | Admitting: Neurosurgery

## 2022-01-28 ENCOUNTER — Other Ambulatory Visit: Payer: Self-pay | Admitting: Neurosurgery

## 2022-01-28 DIAGNOSIS — I429 Cardiomyopathy, unspecified: Secondary | ICD-10-CM | POA: Insufficient documentation

## 2022-01-28 DIAGNOSIS — M069 Rheumatoid arthritis, unspecified: Secondary | ICD-10-CM | POA: Insufficient documentation

## 2022-01-28 DIAGNOSIS — H3530 Unspecified macular degeneration: Secondary | ICD-10-CM | POA: Insufficient documentation

## 2022-01-28 DIAGNOSIS — Q282 Arteriovenous malformation of cerebral vessels: Secondary | ICD-10-CM | POA: Insufficient documentation

## 2022-01-28 DIAGNOSIS — Q049 Congenital malformation of brain, unspecified: Secondary | ICD-10-CM

## 2022-01-28 DIAGNOSIS — Z87891 Personal history of nicotine dependence: Secondary | ICD-10-CM | POA: Diagnosis not present

## 2022-01-28 HISTORY — PX: IR ANGIO EXTERNAL CAROTID SEL EXT CAROTID BILAT MOD SED: IMG5372

## 2022-01-28 HISTORY — PX: IR ANGIO INTRA EXTRACRAN SEL INTERNAL CAROTID BILAT MOD SED: IMG5363

## 2022-01-28 HISTORY — PX: IR ANGIO VERTEBRAL SEL VERTEBRAL BILAT MOD SED: IMG5369

## 2022-01-28 LAB — BASIC METABOLIC PANEL
Anion gap: 5 (ref 5–15)
BUN: 11 mg/dL (ref 6–20)
CO2: 29 mmol/L (ref 22–32)
Calcium: 8.9 mg/dL (ref 8.9–10.3)
Chloride: 106 mmol/L (ref 98–111)
Creatinine, Ser: 1.01 mg/dL (ref 0.61–1.24)
GFR, Estimated: >60 mL/min (ref >60)
Glucose, Bld: 102 mg/dL — ABNORMAL HIGH (ref 70–99)
Potassium: 4.1 mmol/L (ref 3.5–5.1)
Sodium: 140 mmol/L (ref 135–145)

## 2022-01-28 LAB — CBC WITH DIFFERENTIAL/PLATELET
Abs Immature Granulocytes: 0.03 10*3/uL (ref 0.00–0.07)
Basophils Absolute: 0.1 10*3/uL (ref 0.0–0.1)
Basophils Relative: 1 %
Eosinophils Absolute: 0.1 10*3/uL (ref 0.0–0.5)
Eosinophils Relative: 2 %
HCT: 38.7 % — ABNORMAL LOW (ref 39.0–52.0)
Hemoglobin: 13.3 g/dL (ref 13.0–17.0)
Immature Granulocytes: 0 %
Lymphocytes Relative: 14 %
Lymphs Abs: 1.1 10*3/uL (ref 0.7–4.0)
MCH: 29 pg (ref 26.0–34.0)
MCHC: 34.4 g/dL (ref 30.0–36.0)
MCV: 84.5 fL (ref 80.0–100.0)
Monocytes Absolute: 0.5 10*3/uL (ref 0.1–1.0)
Monocytes Relative: 6 %
Neutro Abs: 6.5 10*3/uL (ref 1.7–7.7)
Neutrophils Relative %: 77 %
Platelets: 197 10*3/uL (ref 150–400)
RBC: 4.58 MIL/uL (ref 4.22–5.81)
RDW: 20.2 % — ABNORMAL HIGH (ref 11.5–15.5)
WBC: 8.4 10*3/uL (ref 4.0–10.5)
nRBC: 0 % (ref 0.0–0.2)

## 2022-01-28 LAB — APTT: aPTT: 31 seconds (ref 24–36)

## 2022-01-28 LAB — PROTIME-INR
INR: 1.1 (ref 0.8–1.2)
Prothrombin Time: 14.1 seconds (ref 11.4–15.2)

## 2022-01-28 MED ORDER — MIDAZOLAM HCL 2 MG/2ML IJ SOLN
INTRAMUSCULAR | Status: AC
  Filled 2022-01-28: qty 2

## 2022-01-28 MED ORDER — HYDROCODONE-ACETAMINOPHEN 5-325 MG PO TABS
ORAL_TABLET | ORAL | Status: AC
  Filled 2022-01-28: qty 1

## 2022-01-28 MED ORDER — MIDAZOLAM HCL 2 MG/2ML IJ SOLN
INTRAMUSCULAR | Status: AC | PRN
  Administered 2022-01-28: .5 mg via INTRAVENOUS

## 2022-01-28 MED ORDER — HEPARIN SODIUM (PORCINE) 1000 UNIT/ML IJ SOLN
INTRAMUSCULAR | Status: AC
  Filled 2022-01-28: qty 3

## 2022-01-28 MED ORDER — HYDROCODONE-ACETAMINOPHEN 5-325 MG PO TABS
1.0000 | ORAL_TABLET | ORAL | Status: DC | PRN
  Administered 2022-01-28: 1 via ORAL

## 2022-01-28 MED ORDER — IOHEXOL 300 MG/ML  SOLN
100.0000 mL | Freq: Once | INTRAMUSCULAR | Status: AC | PRN
  Administered 2022-01-28: 50 mL via INTRA_ARTERIAL

## 2022-01-28 MED ORDER — HEPARIN SODIUM (PORCINE) 1000 UNIT/ML IJ SOLN
INTRAMUSCULAR | Status: AC | PRN
  Administered 2022-01-28: 2000 [IU] via INTRAVENOUS

## 2022-01-28 MED ORDER — SODIUM CHLORIDE 0.9 % IV SOLN: INTRAVENOUS | Status: DC

## 2022-01-28 MED ORDER — LIDOCAINE HCL 1 % IJ SOLN
INTRAMUSCULAR | Status: AC
  Filled 2022-01-28: qty 20

## 2022-01-28 MED ORDER — FENTANYL CITRATE (PF) 100 MCG/2ML IJ SOLN
INTRAMUSCULAR | Status: AC | PRN
  Administered 2022-01-28: 25 ug via INTRAVENOUS

## 2022-01-28 MED ORDER — CEFAZOLIN SODIUM-DEXTROSE 2-4 GM/100ML-% IV SOLN: 2.0000 g | INTRAVENOUS | Status: DC

## 2022-01-28 MED ORDER — FENTANYL CITRATE (PF) 100 MCG/2ML IJ SOLN
INTRAMUSCULAR | Status: AC
  Filled 2022-01-28: qty 2

## 2022-01-28 NOTE — Brief Op Note (Signed)
  NEUROSURGERY BRIEF OPERATIVE  NOTE   PREOP DX: AVM  POSTOP DX: Same  PROCEDURE: Diagnostic cerebral angiogram  SURGEON: Dr. Consuella Lose, MD  ANESTHESIA: IV Sedation with Local  APPROACH: Right trans-femoral  EBL: Minimal  SPECIMENS: None  COMPLICATIONS: None  CONDITION: Stable to recovery  FINDINGS (Full report in [facility]): 1. Spetzler-Martin grade 1 left cerebellar AVM   Consuella Lose, MD [facility]

## 2022-01-28 NOTE — Sedation Documentation (Signed)
Patient transported to short stay. Santiago Glad RN at the bedside to receive patient. Groin site assessed. Clean, dry and intact. No drainage noted. Soft to touch , no hematoma noted. +2 palpable distal pulses intact.

## 2022-01-28 NOTE — Sedation Documentation (Signed)
6 Fr angio right groin

## 2022-01-28 NOTE — H&P (Signed)
Chief Complaint   AVM  History of Present Illness  Christopher Yu is a 61 year old man seen at the request of Dr. Laurann Montana.  He is referred for evaluation of a recently discovered incidental likely arteriovenous malformation versus dural fistula.  Briefly, the patient presented with what sounds like generalized fatigue.  His workup revealed low testosterone level.  He ultimately underwent MRI of the brain with pituitary protocol for possible central etiology.  While there was no significant pituitary abnormality noted, he was found to harbor possible posterior fossa AVM versus dural fistula.  Upon questioning, the patient does not report any new headaches, although he does report a chronic history of headaches for which she has been taking gabapentin.  No new numbness tingling or weakness of the extremities.  No gait difficulties or changes in bladder function.  He has visual changes related to early onset of macular degeneration related to a medication he had been put on for rheumatoid arthritis.  Similarly, he had a side effect to one of his rheumatoid medications causing a cardiomyopathy.  He does not report any history of heart attack or stroke.  No known lung, liver, or kidney disease.  He is not currently on any blood thinners or antiplatelet agents.  He quit smoking years ago.  Past Medical History   Past Medical History:  Diagnosis Date   Arthritis, rheumatoid ([place])    dx 1988   CAD (coronary artery disease), native coronary artery    Mild LAD disease with calcification noted at cath 12//27/16    Cardiomyopathy [facility])    Chronic systolic heart failure ([place]) 05/01/2015   Deviated nasal septum 06/25/2011   Hyperlipidemia    Impingement syndrome of left shoulder 12/22/2017   Rheumatoid aortitis    Sleep apnea    does not use cpap - UNABLE TO TOLERATE MASK   Sleep apnea in adult    Status post total right knee replacement 06/01/2015    Past Surgical History   Past Surgical History:   Procedure Laterality Date   ANKLE FUSION Right 2014   related to his arthritis   ANKLE FUSION Left 05/25/13   related to his arthritis   CARDIAC CATHETERIZATION N/A 05/01/2015   Procedure: Left Heart Cath and Coronary Angiography;  Surgeon: Belva Crome, MD;  Location: [place] CV LAB;  Service: Cardiovascular;  Laterality: N/A;   FRACTURE SURGERY     left femur fracture x 3   KNEE ARTHROSCOPY     right x 4   NASAL SEPTOPLASTY W/ TURBINOPLASTY  06/25/2011   Procedure: NASAL SEPTOPLASTY WITH TURBINATE REDUCTION;  Surgeon: Jerrell Belfast, MD;  Location: [place];  Service: ENT;  Laterality: Bilateral;   TONSILLECTOMY     TOTAL KNEE ARTHROPLASTY Right 06/01/2015   Procedure: RIGHT TOTAL KNEE ARTHROPLASTY;  Surgeon: Mcarthur Rossetti, MD;  Location: [facility] ORS;  Service: Orthopedics;  Laterality: Right;  Block+general    Social History   Social History   Tobacco Use   Smoking status: Former    Packs/day: 0.50    Years: 20.00    Total pack years: 10.00    Types: Cigarettes    Quit date: 05/24/2008    Years since quitting: 13.6   Smokeless tobacco: Never   Tobacco comments:    stopped smoking 8 yrs ago.  Substance Use Topics   Alcohol use: Yes    Comment: occaision monthly   Drug use: No    Medications   Prior to Admission medications   Medication Sig  Start Date End Date Taking? Authorizing Provider  aspirin 81 MG tablet Take 81 mg by mouth daily.    [provider]  folic acid (FOLVITE) 1 MG tablet Take 1 mg by mouth daily.    [provider]  leflunomide (ARAVA) 20 MG tablet Take 20 mg by mouth daily. 05/20/19   [provider]  lisinopril (ZESTRIL) 10 MG tablet Take 1 tablet (10 mg total) by mouth daily. 02/04/21   Corky Crafts, MD  methotrexate (RHEUMATREX) 2.5 MG tablet Take 20 mg by mouth once a week. Takes 8 tablets(20mg ) every Saturday.     Caution:Chemotherapy. Protect from light.    [provider]  metoprolol succinate  (TOPROL-XL) 25 MG 24 hr tablet Take 1 tablet (25 mg total) by mouth daily. 02/04/21   Corky Crafts, MD  Multiple Vitamins-Minerals (PRESERVISION AREDS PO) Take 2 capsules by mouth daily.    [provider]  predniSONE (DELTASONE) 5 MG tablet Take 5 mg by mouth daily. 08/08/19   [provider]  RITUXAN 500 MG/50ML injection  06/14/19   [provider]  rosuvastatin (CRESTOR) 10 MG tablet Take 1 tablet (10 mg total) by mouth daily. 02/04/21   Corky Crafts, MD  sertraline (ZOLOFT) 50 MG tablet Take 50 mg by mouth daily.    [provider]  spironolactone (ALDACTONE) 25 MG tablet Take 1 tablet (25 mg total) by mouth daily. 02/04/21   Corky Crafts, MD  sulfaSALAzine (AZULFIDINE) 500 MG tablet Take 1,000 mg by mouth 2 (two) times daily.     [provider]  traMADol (ULTRAM) 50 MG tablet Take 50 mg by mouth as needed. 07/01/18   [provider]    Allergies  No Known Allergies  Review of Systems  ROS  Neurologic Exam  Awake, alert, oriented Memory and concentration grossly intact Speech fluent, appropriate CN grossly intact Motor exam: Upper Extremities Deltoid Bicep Tricep Grip  Right 5/5 5/5 5/5 5/5  Left 5/5 5/5 5/5 5/5   Lower Extremities IP Quad PF DF EHL  Right 5/5 5/5 5/5 5/5 5/5  Left 5/5 5/5 5/5 5/5 5/5   Sensation grossly intact to LT  Imaging  MRI of the brain dated 12/08/2021 was personally reviewed.  This does demonstrate some T2 flow voids in the superficial aspect of the left cerebellar hemisphere as well as somewhat prominent cortical veins in the left occipital region suggestive of a possible dural fistula.   Impression  - 61 y.o. 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 We have  also reviewed in detail the risks, benefits, and alternatives to the procedure. All questions were answered and [facility] provided informed consent to proceed.  Lisbeth Renshaw, MD [facility]

## 2022-01-28 NOTE — Progress Notes (Signed)
Patient ambulated to bathroom, right groin remains CDI, site U.

## 2022-02-06 DIAGNOSIS — Q049 Congenital malformation of brain, unspecified: Secondary | ICD-10-CM | POA: Diagnosis not present

## 2022-02-06 DIAGNOSIS — Z6829 Body mass index (BMI) 29.0-29.9, adult: Secondary | ICD-10-CM | POA: Diagnosis not present

## 2022-02-12 ENCOUNTER — Other Ambulatory Visit: Payer: Self-pay | Admitting: Neurosurgery

## 2022-02-17 ENCOUNTER — Encounter (HOSPITAL_COMMUNITY): Payer: Self-pay

## 2022-02-21 NOTE — Progress Notes (Addendum)
Cardiology Office Note   Date:  02/24/2022   ID:  Pawan, Knechtel Jan 20, 1961, MRN [id number]  PCP:  Kirby Funk, MD    No chief complaint on file.  Chronic systolic heart failure  Wt Readings from Last 3 Encounters:  02/24/22 230 lb (104.3 kg)  01/28/22 232 lb (105.2 kg)  10/25/20 251 lb 9.6 oz (114.1 kg)       History of Present Illness: Christopher Yu is a 61 y.o. male  with a history of rheumatoid arthritis and chronic systolic heart failure.  Ejection fraction has been documented in the 35 to 40% range in the past.   He has had stress testing done in the past.  He does carry a diagnosis of coronary artery disease on his problem list from Dr. Donnie Aho.  ECho in 2016.  Stress test in 2016.  Cath in 2016 was done and there was no significant blockage.   Had problems with a statin in the past.  He does not remember the name.  He can get his cholesterol down with diet.      He has been on medical therapy for his cardiomyopathy for several years.  He has hoped to decrease his medications and we have talked about maintaining ejection fraction by continuing medications, in the past.   In 2022, he has gained weight.  He plays golf regularly.     Denies : Chest pain. Dizziness. Leg edema. Nitroglycerin use. Orthopnea. Palpitations. Paroxysmal nocturnal dyspnea. Shortness of breath. Syncope.    Has AVM surgery from brain scheduled.    Goes to gym on occasion.     Past Medical History:  Diagnosis Date   Arthritis, rheumatoid (HCC)    dx 1988   CAD (coronary artery disease), native coronary artery    Mild LAD disease with calcification noted at cath 12//27/16    Cardiomyopathy [facility])    Chronic systolic heart failure (HCC) 05/01/2015   Deviated nasal septum 06/25/2011   Hyperlipidemia    Impingement syndrome of left shoulder 12/22/2017   Rheumatoid aortitis    Sleep apnea    does not use cpap - UNABLE TO TOLERATE MASK   Sleep apnea in adult     Status post total right knee replacement 06/01/2015    Past Surgical History:  Procedure Laterality Date   ANKLE FUSION Right 2014   related to his arthritis   ANKLE FUSION Left 05/25/13   related to his arthritis   CARDIAC CATHETERIZATION N/A 05/01/2015   Procedure: Left Heart Cath and Coronary Angiography;  Surgeon: Lyn Records, MD;  Location: [facility] INVASIVE CV LAB;  Service: Cardiovascular;  Laterality: N/A;   FRACTURE SURGERY     left femur fracture x 3   IR ANGIO EXTERNAL CAROTID SEL EXT CAROTID BILAT MOD SED  01/28/2022   IR ANGIO INTRA EXTRACRAN SEL INTERNAL CAROTID BILAT MOD SED  01/28/2022   IR ANGIO VERTEBRAL SEL VERTEBRAL BILAT MOD SED  01/28/2022   KNEE ARTHROSCOPY     right x 4   NASAL SEPTOPLASTY W/ TURBINOPLASTY  06/25/2011   Procedure: NASAL SEPTOPLASTY WITH TURBINATE REDUCTION;  Surgeon: Osborn Coho, MD;  Location: [facility] OR;  Service: ENT;  Laterality: Bilateral;   TONSILLECTOMY     TOTAL KNEE ARTHROPLASTY Right 06/01/2015   Procedure: RIGHT TOTAL KNEE ARTHROPLASTY;  Surgeon: Kathryne Hitch, MD;  [facility]S;  Service: Orthopedics;  Laterality: Right;  Block+general     Current Outpatient Medications  Medication Sig Dispense Refill  aspirin 81 MG tablet Take 81 mg by mouth daily.     folic acid (FOLVITE) 1 MG tablet Take 1 mg by mouth daily.     lisinopril (ZESTRIL) 10 MG tablet Take 1 tablet (10 mg total) by mouth daily. 90 tablet 2   methotrexate (RHEUMATREX) 2.5 MG tablet Take 20 mg by mouth once a week. Takes 8 tablets(20mg ) every Saturday.     Caution:Chemotherapy. Protect from light.     metoprolol succinate (TOPROL-XL) 25 MG 24 hr tablet Take 1 tablet (25 mg total) by mouth daily. 90 tablet 2   Multiple Vitamins-Minerals (PRESERVISION AREDS PO) Take 2 capsules by mouth daily.     predniSONE (DELTASONE) 5 MG tablet Take 5 mg by mouth daily.     RITUXAN 500 MG/50ML injection      rosuvastatin (CRESTOR) 10 MG tablet Take 1 tablet (10 mg total) by  mouth daily. 90 tablet 2   sertraline (ZOLOFT) 50 MG tablet Take 50 mg by mouth daily.     spironolactone (ALDACTONE) 25 MG tablet Take 1 tablet (25 mg total) by mouth daily. 90 tablet 2   sulfaSALAzine (AZULFIDINE) 500 MG tablet Take 1,000 mg by mouth 2 (two) times daily.      traMADol (ULTRAM) 50 MG tablet Take 50 mg by mouth as needed.     No current facility-administered medications for this visit.    Allergies:   Patient has no known allergies.    Social History:  The patient  reports that he quit smoking about 13 years ago. His smoking use included cigarettes. He has a 10.00 pack-year smoking history. He has never used smokeless tobacco. He reports current alcohol use. He reports that he does not use drugs.   Family History:  The patient's family history includes Lupus in his sister.    ROS:  Please see the history of present illness.   Otherwise, review of systems are positive for back pain.   All other systems are reviewed and negative.    PHYSICAL EXAM: VS:  BP 104/66   Pulse (!) 52   Ht 6\' 1"  (1.854 m)   Wt 230 lb (104.3 kg)   SpO2 96%   BMI 30.34 kg/m  , BMI Body mass index is 30.34 kg/m. GEN: Well nourished, well developed, in no acute distress HEENT: normal Neck: no JVD, carotid bruits, or masses Cardiac: RRR; no murmurs, rubs, or gallops,no edema  Respiratory:  clear to auscultation bilaterally, normal work of breathing GI: soft, nontender, nondistended, + BS MS: no deformity or atrophy Skin: warm and dry, no rash Neuro:  Strength and sensation are intact Psych: euthymic mood, full affect   EKG:   The ekg ordered today demonstrates NSR, nonspecific ST T wave changes, no change from prior   Recent Labs: 01/28/2022: BUN 11; Creatinine, Ser 1.01; Hemoglobin 13.3; Platelets 197; Potassium 4.1; Sodium 140   Lipid Panel No results found for: "CHOL", "TRIG", "HDL", "CHOLHDL", "VLDL", "LDLCALC", "LDLDIRECT"   Other studies Reviewed: Additional studies/ records  that were reviewed today with results demonstrating: labs reviewed.   ASSESSMENT AND PLAN:  Chronic systolic heart failure/nonischemic cardiomyopathy: EF 40% in 2019 per Dr. Wynonia Lawman.  He has hope to decrease his medications and we have talked about maintaining ejection fraction by continuing medications, in the past.  Given the upcoming brain surgery,Planned for nuclear stress test.  PET/CT scan. Hyperlipidemia: Tolerating rosuvastatin.  LDL 104,Would prefer to see LDL around 70..Increase Crestor to 20 mg daily .  Check liver and  lipid tests in about 3 months. Coronary artery calcification: No clear angina.  Did have 1 episode of chest tightness after exercising for the first time in quite a while.  Given upcoming brain surgery, will check PET/CT stress test to evaluate for any high risk ischemia. Whole food plant-based diet.  High-fiber diet.  Avoid processed foods.    Current medicines are reviewed at length with the patient today.  The patient concerns regarding his medicines were addressed.  The following changes have been made: Increase Crestor to 20 mg daily  Labs/ tests ordered today include: As above No orders of the defined types were placed in this encounter.   Recommend 150 minutes/week of aerobic exercise Low fat, low carb, high fiber diet recommended  Disposition:   FU in 1 year or sooner if stress test is abnormal   Signed, Lance Muss, MD  02/24/2022 8:29 AM    [facility] [address] [place], [place], [place]  [address] Phone: [phone]; Fax: [phone]   Addendum on 03/19/22: ECG reviewed again.  As noted above, he has ST and T wave changes which would make exercise test uninterpretable. Imaging will be necessary.  Corky Crafts, MD

## 2022-02-24 ENCOUNTER — Encounter: Payer: Self-pay | Admitting: Interventional Cardiology

## 2022-02-24 ENCOUNTER — Ambulatory Visit: Payer: BC Managed Care – PPO | Attending: Nurse Practitioner | Admitting: Interventional Cardiology

## 2022-02-24 VITALS — BP 104/66 | HR 52 | Ht 73.0 in | Wt 230.0 lb

## 2022-02-24 DIAGNOSIS — I2510 Atherosclerotic heart disease of native coronary artery without angina pectoris: Secondary | ICD-10-CM | POA: Diagnosis not present

## 2022-02-24 DIAGNOSIS — R072 Precordial pain: Secondary | ICD-10-CM | POA: Diagnosis not present

## 2022-02-24 DIAGNOSIS — I5022 Chronic systolic (congestive) heart failure: Secondary | ICD-10-CM | POA: Diagnosis not present

## 2022-02-24 DIAGNOSIS — I2584 Coronary atherosclerosis due to calcified coronary lesion: Secondary | ICD-10-CM

## 2022-02-24 DIAGNOSIS — E782 Mixed hyperlipidemia: Secondary | ICD-10-CM

## 2022-02-24 MED ORDER — ROSUVASTATIN CALCIUM 20 MG PO TABS: 20.0000 mg | ORAL_TABLET | Freq: Every day | ORAL | 3 refills | Status: DC

## 2022-02-24 NOTE — Patient Instructions (Signed)
Medication Instructions:  Your physician has recommended you make the following change in your medication: increase Rosuvastatin to 20 mg by mouth daily   *If you need a refill on your cardiac medications before your next appointment, please call your pharmacy*   Lab Work: Your physician recommends that you return for lab work on May 27, 2022.  This will be fasting.  The lab opens at 7:15 AM  If you have labs (blood work) drawn today and your tests are completely normal, you will receive your results only by: MyChart Message (if you have MyChart) OR A paper copy in the mail If you have any lab test that is abnormal or we need to change your treatment, we will call you to review the results.   Testing/Procedures: Dr Eldridge Dace recommends you have a Cardiac PET Scan   Follow-Up: At [facility], you and your health needs are our priority.  As part of our continuing mission to provide you with exceptional heart care, we have created designated Provider Care Teams.  These Care Teams include your primary Cardiologist (physician) and Advanced Practice Providers (APPs -  Physician Assistants and Nurse Practitioners) who all work together to provide you with the care you need, when you need it.  We recommend signing up for the patient portal called "MyChart".  Sign up information is provided on this After Visit Summary.  MyChart is used to connect with patients for Virtual Visits (Telemedicine).  Patients are able to view lab/test results, encounter notes, upcoming appointments, etc.  Non-urgent messages can be sent to your provider as well.   To learn more about what you can do with MyChart, go to [email or web address].    Your next appointment:   12 month(s)  The format for your next appointment:   In Person  Provider:   Lance Muss, MD     Other Instructions How to Prepare for Your Cardiac PET/CT Stress Test:  1. Please do not take these medications before your  test: Do not take Toprol the morning of the test  Medications that may interfere with the cardiac pharmacological stress agent (ex. nitrates - including erectile dysfunction medications or beta-blockers) the day of the exam. (Erectile dysfunction medication should be held for at least 72 hrs prior to test) Theophylline containing medications for 12 hours. Dipyridamole 48 hours prior to the test. Your remaining medications may be taken with water.  2. Nothing to eat or drink, except water, 3 hours prior to arrival time.   NO caffeine/decaffeinated products, or chocolate 12 hours prior to arrival.  3. NO perfume, cologne or lotion  4. Total time is 1 to 2 hours; you may want to bring reading material for the waiting time.  5. Please report to Admitting at the [facility] Main Entrance 60 minutes early for your test.  [address] [place], [place] [address]     In preparation for your appointment, medication and supplies will be purchased.  Appointment availability is limited, so if you need to cancel or reschedule, please call the Radiology Department at [phone]  24 hours in advance to avoid a cancellation fee of $100.00  What to Expect After you Arrive:  Once you arrive and check in for your appointment, you will be taken to a preparation room within the Radiology Department.  A technologist or Nurse will obtain your medical history, verify that you are correctly prepped for the exam, and explain the procedure.  Afterwards,  an IV will be  started in your arm and electrodes will be placed on your skin for EKG monitoring during the stress portion of the exam. Then you will be escorted to the PET/CT scanner.  There, staff will get you positioned on the scanner and obtain a blood pressure and EKG.  During the exam, you will continue to be connected to the EKG and blood pressure machines.  A small, safe amount of a radioactive tracer will be injected in your IV to obtain a  series of pictures of your heart along with an injection of a stress agent.    After your Exam:  It is recommended that you eat a meal and drink a caffeinated beverage to counter act any effects of the stress agent.  Drink plenty of fluids for the remainder of the day and urinate frequently for the first couple of hours after the exam.  Your doctor will inform you of your test results within 7-10 business days.  For questions about your test or how to prepare for your test, please call: Marchia Bond, Cardiac Imaging Nurse Navigator  Gordy Clement, Cardiac Imaging Nurse Navigator Office: [phone]   Important Information About Sugar

## 2022-02-27 DIAGNOSIS — E291 Testicular hypofunction: Secondary | ICD-10-CM | POA: Diagnosis not present

## 2022-03-05 ENCOUNTER — Ambulatory Visit (INDEPENDENT_AMBULATORY_CARE_PROVIDER_SITE_OTHER): Payer: BC Managed Care – PPO | Admitting: Orthopaedic Surgery

## 2022-03-05 ENCOUNTER — Ambulatory Visit (INDEPENDENT_AMBULATORY_CARE_PROVIDER_SITE_OTHER): Payer: BC Managed Care – PPO

## 2022-03-05 ENCOUNTER — Encounter: Payer: Self-pay | Admitting: Orthopaedic Surgery

## 2022-03-05 VITALS — Ht 73.0 in | Wt 230.0 lb

## 2022-03-05 DIAGNOSIS — Z96651 Presence of right artificial knee joint: Secondary | ICD-10-CM

## 2022-03-05 DIAGNOSIS — M25561 Pain in right knee: Secondary | ICD-10-CM

## 2022-03-05 NOTE — Progress Notes (Signed)
Office Visit Note   Patient: Christopher Yu           Date of Birth: [birth year]           MRN: [id number] Visit Date: 03/05/2022              Requested by: Lavone Orn, MD [address] [place],  [place] [address] PCP: Lavone Orn, MD   Assessment & Plan: Visit Diagnoses:  1. History of total right knee replacement     Plan: We will have him wear a hinged knee brace which he is given today.  Activities as tolerated.  Follow-up 4 to 6 weeks or if pain becomes worse he will return sooner.  Questions were encouraged and answered by Dr. Delilah Shan and myself.  Follow-Up Instructions: Return in about 4 weeks (around 04/02/2022).   Orders:  Orders Placed This Encounter  Procedures   XR Knee 1-2 Views Right   No orders of the defined types were placed in this encounter.     Procedures: No procedures performed   Clinical Data: No additional findings.   Subjective: No chief complaint on file.   HPI 61 year old male who twisted his knee this past weekend.  States he slipped on some pine straw fell backwards.  Initially had significant pain difficulty bearing weight on the knee.  History of right total knee arthroplasty 06/01/2015.  States that the knee was doing well until this episode.  No instability or pain in the knee prior.  He notes now that the right knee feels like it is going to give way on him particularly with going up and down stairs.  The pain overall has improved with icing and time.No other injuries.   Review of Systems   Objective: Vital Signs: Ht 6\' 1"  (1.854 m)   Wt 230 lb (104.3 kg)   BMI 30.34 kg/m   Physical Exam Constitutional:      Appearance: He is not ill-appearing or diaphoretic.  Pulmonary:     Effort: Pulmonary effort is normal.  Neurological:     Mental Status: He is alert and oriented to person, place, and time.  Psychiatric:        Mood and Affect: Mood normal.     Ortho Exam Right knee full extension  full flexion active.  No abnormal warmth erythema.  No ecchymosis about the knee.  Well-healed surgical incision.  Tenderness along medial joint line along the medial collateral ligament region.  No instability valgus varus stressing.  Anterior drawer is negative.  Slight effusion. Specialty Comments:  No specialty comments available.  Imaging: XR Knee 1-2 Views Right  Result Date: 03/05/2022 Right knee 2 views: Status post right total knee arthroplasty with well-seated components.  No acute fractures or acute findings.  Knee is well located.  Normal bone density.  Arthrosclerosis popliteal vessels.    PMFS History: Patient Active Problem List   Diagnosis Date Noted   Impingement syndrome of left shoulder 12/22/2017   Impingement syndrome of right shoulder 12/22/2017   Rheumatoid arthritis involving right knee ([place]) 06/01/2015   Status post total right knee replacement 06/01/2015   Cardiomyopathy ([place]) 05/02/2015   Hyperlipidemia    Sleep apnea in adult    Rheumatoid arthritis ([place])    Chronic systolic heart failure ([place]) 05/01/2015   CAD (coronary artery disease), native coronary artery    Past Medical History:  Diagnosis Date   Arthritis, rheumatoid (HCC)    dx 1988   CAD (coronary artery  disease), native coronary artery    Mild LAD disease with calcification noted at cath 12//27/16    Cardiomyopathy [facility])    Chronic systolic heart failure ([place]) 05/01/2015   Deviated nasal septum 06/25/2011   Hyperlipidemia    Impingement syndrome of left shoulder 12/22/2017   Rheumatoid aortitis    Sleep apnea    does not use cpap - UNABLE TO TOLERATE MASK   Sleep apnea in adult    Status post total right knee replacement 06/01/2015    Family History  Problem Relation Age of Onset   Lupus Sister     Past Surgical History:  Procedure Laterality Date   ANKLE FUSION Right 2014   related to his arthritis   ANKLE FUSION Left 05/25/13   related to his arthritis   CARDIAC CATHETERIZATION  N/A 05/01/2015   Procedure: Left Heart Cath and Coronary Angiography;  Surgeon: Belva Crome, MD;  Location: [facility];  Service: Cardiovascular;  Laterality: N/A;   FRACTURE SURGERY     left femur fracture x 3   IR ANGIO EXTERNAL CAROTID SEL EXT CAROTID BILAT MOD SED  01/28/2022   IR ANGIO INTRA EXTRACRAN SEL INTERNAL CAROTID BILAT MOD SED  01/28/2022   IR ANGIO VERTEBRAL SEL VERTEBRAL BILAT MOD SED  01/28/2022   KNEE ARTHROSCOPY     right x 4   NASAL SEPTOPLASTY W/ TURBINOPLASTY  06/25/2011   Procedure: NASAL SEPTOPLASTY WITH TURBINATE REDUCTION;  Surgeon: Jerrell Belfast, MD;  Location: [facility];  Service: ENT;  Laterality: Bilateral;   TONSILLECTOMY     TOTAL KNEE ARTHROPLASTY Right 06/01/2015   Procedure: RIGHT TOTAL KNEE ARTHROPLASTY;  Surgeon: Mcarthur Rossetti, MD;  Location: [facility];  Service: Orthopedics;  Laterality: Right;  Block+general   Social History   Occupational History   Not on file  Tobacco Use   Smoking status: Former    Packs/day: 0.50    Years: 20.00    Total pack years: 10.00    Types: Cigarettes    Quit date: 05/24/2008    Years since quitting: 13.7   Smokeless tobacco: Never   Tobacco comments:    stopped smoking 8 yrs ago.  Substance and Sexual Activity   Alcohol use: Yes    Comment: occaision monthly   Drug use: No   Sexual activity: Not on file

## 2022-03-17 ENCOUNTER — Ambulatory Visit: Payer: BC Managed Care – PPO | Admitting: Orthopaedic Surgery

## 2022-03-17 ENCOUNTER — Encounter: Payer: Self-pay | Admitting: Interventional Cardiology

## 2022-03-19 NOTE — Telephone Encounter (Signed)
Corky Crafts, MD  Dossie Arbour, RN OK to change to exercise myoview.

## 2022-03-25 DIAGNOSIS — H524 Presbyopia: Secondary | ICD-10-CM | POA: Diagnosis not present

## 2022-03-25 DIAGNOSIS — H2513 Age-related nuclear cataract, bilateral: Secondary | ICD-10-CM | POA: Diagnosis not present

## 2022-03-25 DIAGNOSIS — H353131 Nonexudative age-related macular degeneration, bilateral, early dry stage: Secondary | ICD-10-CM | POA: Diagnosis not present

## 2022-03-25 DIAGNOSIS — H04123 Dry eye syndrome of bilateral lacrimal glands: Secondary | ICD-10-CM | POA: Diagnosis not present

## 2022-03-26 DIAGNOSIS — E291 Testicular hypofunction: Secondary | ICD-10-CM | POA: Diagnosis not present

## 2022-04-02 NOTE — Pre-Procedure Instructions (Signed)
Surgical Instructions    Your procedure is scheduled on Friday 04/11/22.   Report to [facility] Main Entrance "A" at 05:30 A.M., then check in with the Admitting office.  Call this number if you have problems the morning of surgery:  [phone]   If you have any questions prior to your surgery date call [phone]: Open Monday-Friday 8am-4pm If you experience any cold or flu symptoms such as cough, fever, chills, shortness of breath, etc. between now and your scheduled surgery, please notify us at the above number     Remember:  Do not eat or drink after midnight the night before your surgery    Take these medicines the morning of surgery with A SIP OF WATER:   gabapentin (NEURONTIN)   metoprolol succinate (TOPROL-XL)   Propylene Glycol (SYSTANE COMPLETE)   rosuvastatin (CRESTOR)   sertraline (ZOLOFT)      traMADol (ULTRAM)- If needed   Please follow your surgeon's instructions regarding leflunomide (ARAVA) and sulfaSALAzine (AZULFIDINE).  As of today, STOP taking any Aspirin (unless otherwise instructed by your surgeon) Aleve, Naproxen, Ibuprofen, Motrin, Advil, Goody's, BC's, all herbal medications, fish oil, and all vitamins.           Do not wear jewelry or makeup. Do not wear lotions, powders, perfumes/cologne or deodorant. Do not shave 48 hours prior to surgery.  Men may shave face and neck. Do not bring valuables to the hospital. Do not wear nail polish, gel polish, artificial nails, or any other type of covering on natural nails (fingers and toes) If you have artificial nails or gel coating that need to be removed by a nail salon, please have this removed prior to surgery. Artificial nails or gel coating may interfere with anesthesia's ability to adequately monitor your vital signs.  [facility] is not responsible for any belongings or valuables.    Do NOT Smoke (Tobacco/Vaping)  24 hours prior to your procedure  If you use a CPAP at night, you may bring your  mask for your overnight stay.   Contacts, glasses, hearing aids, dentures or partials may not be worn into surgery, please bring cases for these belongings   For patients admitted to the hospital, discharge time will be determined by your treatment team.   Patients discharged the day of surgery will not be allowed to drive home, and someone needs to stay with them for 24 hours.   SURGICAL WAITING ROOM VISITATION Patients having surgery or a procedure may have no more than 2 support people in the waiting area - these visitors may rotate.   Children under the age of 83 must have an adult with them who is not the patient. If the patient needs to stay at the hospital during part of their recovery, the visitor guidelines for inpatient rooms apply. Pre-op nurse will coordinate an appropriate time for 1 support person to accompany patient in pre-op.  This support person may not rotate.   Please refer to [email or web address] for the visitor guidelines for Inpatients (after your surgery is over and you are in a regular room).    Special instructions:    Oral Hygiene is also important to reduce your risk of infection.  Remember - BRUSH YOUR TEETH THE MORNING OF SURGERY WITH YOUR REGULAR TOOTHPASTE   [facility]- Preparing For Surgery  Before surgery, you can play an important role. Because skin is not sterile, your skin needs to be as free of germs as possible. You can reduce the number of germs  on your skin by washing with CHG (chlorahexidine gluconate) Soap before surgery.  CHG is an antiseptic cleaner which kills germs and bonds with the skin to continue killing germs even after washing.     Please do not use if you have an allergy to CHG or antibacterial soaps. If your skin becomes reddened/irritated stop using the CHG.  Do not shave (including legs and underarms) for at least 48 hours prior to first CHG shower. It is OK to shave your  face.  Please follow these instructions carefully.     Shower the NIGHT BEFORE SURGERY and the MORNING OF SURGERY with CHG Soap.   If you chose to wash your hair, wash your hair first as usual with your normal shampoo. After you shampoo, rinse your hair and body thoroughly to remove the shampoo.  Then [employer] and genitals (private parts) with your normal soap and rinse thoroughly to remove soap.  After that Use CHG Soap as you would any other liquid soap. You can apply CHG directly to the skin and wash gently with a scrungie or a clean washcloth.   Apply the CHG Soap to your body ONLY FROM THE NECK DOWN.  Do not use on open wounds or open sores. Avoid contact with your eyes, ears, mouth and genitals (private parts). Wash Face and genitals (private parts)  with your normal soap.   Wash thoroughly, paying special attention to the area where your surgery will be performed.  Thoroughly rinse your body with warm water from the neck down.  DO NOT shower/wash with your normal soap after using and rinsing off the CHG Soap.  Pat yourself dry with a CLEAN TOWEL.  Wear CLEAN PAJAMAS to bed the night before surgery  Place CLEAN SHEETS on your bed the night before your surgery  DO NOT SLEEP WITH PETS.   Day of Surgery:  Take a shower with CHG soap. Wear Clean/Comfortable clothing the morning of surgery Do not apply any deodorants/lotions.   Remember to brush your teeth WITH YOUR REGULAR TOOTHPASTE.    If you received a COVID test during your pre-op visit, it is requested that you wear a mask when out in public, stay away from anyone that may not be feeling well, and notify your surgeon if you develop symptoms. If you have been in contact with anyone that has tested positive in the last 10 days, please notify your surgeon.    Please read over the following fact sheets that you were given.

## 2022-04-03 ENCOUNTER — Encounter (HOSPITAL_COMMUNITY)
Admission: RE | Admit: 2022-04-03 | Discharge: 2022-04-03 | Disposition: A | Discharge status: Home / Self Care | Payer: BC Managed Care – PPO | Source: Ambulatory Visit | Attending: Neurosurgery | Admitting: Neurosurgery

## 2022-04-03 ENCOUNTER — Encounter (HOSPITAL_COMMUNITY): Payer: Self-pay

## 2022-04-03 ENCOUNTER — Other Ambulatory Visit: Payer: Self-pay | Admitting: Interventional Cardiology

## 2022-04-03 ENCOUNTER — Other Ambulatory Visit: Payer: Self-pay

## 2022-04-03 VITALS — BP 116/60 | HR 54 | Temp 97.5°F | Resp 17 | Ht 73.5 in | Wt 239.8 lb

## 2022-04-03 DIAGNOSIS — Z89521 Acquired absence of right knee: Secondary | ICD-10-CM | POA: Insufficient documentation

## 2022-04-03 DIAGNOSIS — M069 Rheumatoid arthritis, unspecified: Secondary | ICD-10-CM | POA: Insufficient documentation

## 2022-04-03 DIAGNOSIS — Q282 Arteriovenous malformation of cerebral vessels: Secondary | ICD-10-CM | POA: Diagnosis not present

## 2022-04-03 DIAGNOSIS — Z87891 Personal history of nicotine dependence: Secondary | ICD-10-CM | POA: Diagnosis not present

## 2022-04-03 DIAGNOSIS — I5022 Chronic systolic (congestive) heart failure: Secondary | ICD-10-CM | POA: Insufficient documentation

## 2022-04-03 DIAGNOSIS — G4733 Obstructive sleep apnea (adult) (pediatric): Secondary | ICD-10-CM | POA: Diagnosis not present

## 2022-04-03 DIAGNOSIS — I350 Nonrheumatic aortic (valve) stenosis: Secondary | ICD-10-CM | POA: Insufficient documentation

## 2022-04-03 DIAGNOSIS — I2510 Atherosclerotic heart disease of native coronary artery without angina pectoris: Secondary | ICD-10-CM | POA: Insufficient documentation

## 2022-04-03 DIAGNOSIS — R9431 Abnormal electrocardiogram [ECG] [EKG]: Secondary | ICD-10-CM | POA: Insufficient documentation

## 2022-04-03 DIAGNOSIS — E785 Hyperlipidemia, unspecified: Secondary | ICD-10-CM | POA: Insufficient documentation

## 2022-04-03 DIAGNOSIS — Z79899 Other long term (current) drug therapy: Secondary | ICD-10-CM | POA: Insufficient documentation

## 2022-04-03 DIAGNOSIS — Z01812 Encounter for preprocedural laboratory examination: Secondary | ICD-10-CM | POA: Insufficient documentation

## 2022-04-03 DIAGNOSIS — Z01818 Encounter for other preprocedural examination: Secondary | ICD-10-CM

## 2022-04-03 HISTORY — DX: Anxiety disorder, unspecified: F41.9

## 2022-04-03 HISTORY — DX: Headache, unspecified: R51.9

## 2022-04-03 HISTORY — DX: Depression, unspecified: F32.A

## 2022-04-03 LAB — URINALYSIS, ROUTINE W REFLEX MICROSCOPIC
Bilirubin Urine: NEGATIVE
Glucose, UA: NEGATIVE mg/dL
Hgb urine dipstick: NEGATIVE
Ketones, ur: NEGATIVE mg/dL
Leukocytes,Ua: NEGATIVE
Nitrite: NEGATIVE
Protein, ur: NEGATIVE mg/dL
Specific Gravity, Urine: 1.018 (ref 1.005–1.030)
pH: 5 (ref 5.0–8.0)

## 2022-04-03 LAB — CBC
HCT: 40.7 % (ref 39.0–52.0)
Hemoglobin: 13.8 g/dL (ref 13.0–17.0)
MCH: 30.9 pg (ref 26.0–34.0)
MCHC: 33.9 g/dL (ref 30.0–36.0)
MCV: 91.3 fL (ref 80.0–100.0)
Platelets: 206 10*3/uL (ref 150–400)
RBC: 4.46 MIL/uL (ref 4.22–5.81)
RDW: 15.9 % — ABNORMAL HIGH (ref 11.5–15.5)
WBC: 8.3 10*3/uL (ref 4.0–10.5)
nRBC: 0 % (ref 0.0–0.2)

## 2022-04-03 NOTE — Progress Notes (Signed)
PCP - Kirby Funk MD- retired now Dr. Margaretann Loveless Cardiologist - Eldridge Dace  PPM/ICD - denies  Chest x-ray - n/a EKG - 02/24/22 Stress Test - 2005 ECHO - 04/27/15 & 2019 Cardiac Cath - 05/01/15  Sleep Study - yes- was + then - after deviated septum repaired CPAP - n/a  No diabetes.  Please follow your surgeon's instructions regarding leflunomide (ARAVA) and sulfaSALAzine (AZULFIDINE).   As of today, STOP taking any Aspirin (unless otherwise instructed by your surgeon) Aleve, Naproxen, Ibuprofen, Motrin, Advil, Goody's, BC's, all herbal medications, fish oil, and all vitamins.  ERAS Protcol - no PRE-SURGERY Ensure or G2- n/a  COVID TEST- n/a  Anesthesia review: yes - heart history  Patient denies shortness of breath, fever, cough and chest pain at PAT appointment   All instructions explained to the patient, with a verbal understanding of the material. Patient agrees to go over the instructions while at home for a better understanding. The opportunity to ask questions was provided.

## 2022-04-04 ENCOUNTER — Telehealth: Payer: Self-pay | Admitting: Interventional Cardiology

## 2022-04-04 DIAGNOSIS — Z01818 Encounter for other preprocedural examination: Secondary | ICD-10-CM

## 2022-04-04 DIAGNOSIS — R072 Precordial pain: Secondary | ICD-10-CM

## 2022-04-04 DIAGNOSIS — I2510 Atherosclerotic heart disease of native coronary artery without angina pectoris: Secondary | ICD-10-CM

## 2022-04-04 NOTE — Progress Notes (Signed)
Anesthesia Chart Review:  Case: [id number] Date/Time: 04/11/22 0715   Procedures:      STEREOTACTIC SUBOCCIPITAL CRANI FOR RESECT OF AVM     APPLICATION OF CRANIAL NAVIGATION   Anesthesia type: General   Pre-op diagnosis: ARTERIOVENOUS MALFORMATION OF BRAIN   [facility]: [facility] ROOM 21 / [facility]   Surgeons: Lisbeth Renshaw, MD       DISCUSSION: Patient is a 61 year old male scheduled for the above procedure.  History includes former smoker (quit 05/24/08), RA, chronic systolic CHF, nonischemic cardiomyopathy (minimal CAD EF 35-40% 05/01/15, CAD (minimal CAD on 2016), HLD, brain AVM, OSA (intolerant to CPAP), right TKA (06/01/15).    Last visit with cardiologist Dr. Eldridge Dace was 02/24/2022.  A preoperative stress test ordered in anticipation for brain surgery. Initially a PET/CT stress test was ordered, but the test had to be changed to an exercise Myoview due to insurance. I don't see that testing has been done yet. 1 year follow-up planned.  I contacted [facility] to follow-up on stress test and/or clearance status. I also updated Nikki at Dr. Val Riles office.       BMET was not done with PAT labs. Unremarkable BMET on 01/28/22, but > 30 days ago, so will plan for STAT BMET day of surgery.   Chart will be left for follow-up cardiology input.   ADDENDUM 04/10/22 11:41 AM: He had a nuclear stress test on 04/09/22 with findings consistent with prior MI, EF 39%. No reversible ischemia per Dr. Eldridge Dace. He wrote, "No reversible ischemia.  EF is stable from 2019 echo.  Findings consistent with nonischemic cardiomyopathy.  No further cardiac testing needed before AVM surgery.  Avoid excessive IV fluids at the time of surgery to prevent volume overload. "  Anesthesia team to evaluate on the day of surgery.    VS: BP 116/60   Pulse (!) 54   Temp (!) 36.4 C   Resp 17   Ht 6' 1.5" (1.867 m)   Wt 108.8 kg   SpO2 97%   BMI 31.21 kg/m    PROVIDERS: Kirby Funk, MD was PCP, recently  retired and will be seeing Hillard Danker, MD Lance Muss, MD is cardiologist Casimer Lanius, MD is rheumatologist   LABS: Preoperative labs noted. See DISCUSSION. (all labs ordered are listed, but only abnormal results are displayed)  Labs Reviewed  CBC - Abnormal; Notable for the following components:      Result Value   RDW 15.9 (*)    All other components within normal limits  URINALYSIS, ROUTINE W REFLEX MICROSCOPIC  TYPE AND SCREEN     IMAGES: Diagnostic Cerebral Angiogram 01/28/22: IMPRESSION:  1. Spetzler-Martin grade 1 left cerebellar arteriovenous  malformation as described above     MRI Brain 12/08/21: IMPRESSION: 1. Tangle of small vessels in the posterior left cerebellum with probable draining vein. Findings are highly suspicious for dural arteriovenous fistula or arteriovenous malformation. No appreciable surrounding edema. Recommend neurointerventional radiology consultation for management and probable catheter arteriogram. 2. No evidence of a pituitary mass.   EKG: 02/24/22: SB at 50 bpm. LAFB. Non-specific ST/T wave abnormality.    CV: Nuclear stress test 04/09/22:   Findings are consistent with prior myocardial infarction. The study is intermediate risk.   No ST deviation was noted.   LV perfusion is abnormal. Defect 1: There is a small defect with mild reduction in uptake present in the mid inferolateral location(s) that is fixed. There is abnormal wall motion in the defect area. Consistent with infarction.  Left ventricular function is normal. Nuclear stress EF: 39 %. The left ventricular ejection fraction is moderately decreased (30-44%). End diastolic cavity size is moderately enlarged.   Prior study available for comparison from 04/26/2015. - Reviewed by Dr. Eldridge Dace, "No reversible ischemia.  EF is stable from 2019 echo.  Findings consistent with nonischemic cardiomyopathy.  No further cardiac testing needed before AVM surgery.  Avoid excessive IV  fluids at the time of surgery to prevent volume overload."    Echo 06/22/17 [facility]): Conclusions: Moderate concentric LVH with moderate decrease in global wall motion. LV regional wall motion findings:. No wall motion abnormalities. Estimated EF 40%. Left atrial cavity is mildly dilated.  Trileaflet aortic vale with a no regurgitation noted. Mildly aortic vale leaflets. Trace aortic valve stenosis. AV Pk Vel 1.94 m/s. AV Pk Grad 15.0 mmHg. AV Mean Grad 8.8 mmHg. AVA (Vmax) 1.36 cm2. Trace mitral, tricuspid, and pulmonic regurgitation.  The aortic root is mildly dilated.   Cardiac cath 05/01/15: Ost LAD to Dist LAD lesion, 15% stenosed. There is mild to moderate left ventricular systolic dysfunction.   Left dominant coronary system. Minimal plaquing and calcification noted in the LAD. Nondominant right. Overall, widely patent coronary arteries. Mildly dilated and globally hypocontractile left ventricle, poorly visualized by hand injection. Ejection fraction is in the 35-40% range.     RECOMMENDATIONS:   Medical therapy for decreased LV function including removal of any potentially cardiotoxic therapies.  Past Medical History:  Diagnosis Date   Anxiety    Arthritis, rheumatoid (HCC)    dx 1988   CAD (coronary artery disease), native coronary artery    Mild LAD disease with calcification noted at cath 12//27/16    Cardiomyopathy [facility])    Chronic systolic heart failure (HCC) 05/01/2015   Depression    Deviated nasal septum 06/25/2011   Headache    Hyperlipidemia    Impingement syndrome of left shoulder 12/22/2017   Rheumatoid aortitis    Sleep apnea    does not use cpap - UNABLE TO TOLERATE MASK   Sleep apnea in adult    deviated septum repaired, most recent sleep study was negative   Status post total right knee replacement 06/01/2015    Past Surgical History:  Procedure Laterality Date   ANKLE FUSION Right 2014   related to his arthritis   ANKLE FUSION Left  05/25/13   related to his arthritis   CARDIAC CATHETERIZATION N/A 05/01/2015   Procedure: Left Heart Cath and Coronary Angiography;  Surgeon: Lyn Records, MD;  Location[facility] INVASIVE CV LAB;  Service: Cardiovascular;  Laterality: N/A;   FRACTURE SURGERY     left femur fracture x 3   IR ANGIO EXTERNAL CAROTID SEL EXT CAROTID BILAT MOD SED  01/28/2022   IR ANGIO INTRA EXTRACRAN SEL INTERNAL CAROTID BILAT MOD SED  01/28/2022   IR ANGIO VERTEBRAL SEL VERTEBRAL BILAT MOD SED  01/28/2022   KNEE ARTHROSCOPY     right x 4   NASAL SEPTOPLASTY W/ TURBINOPLASTY  06/25/2011   Procedure: NASAL SEPTOPLASTY WITH TURBINATE REDUCTION;  Surgeon: Osborn Coho, MD;  Location: [facility] OR;  Service: ENT;  Laterality: Bilateral;   TONSILLECTOMY     TOTAL KNEE ARTHROPLASTY Right 06/01/2015   Procedure: RIGHT TOTAL KNEE ARTHROPLASTY;  Surgeon: Kathryne Hitch, MD;  Location: [facility] ORS;  Service: Orthopedics;  Laterality: Right;  Block+general    MEDICATIONS:  aspirin 81 MG tablet   CALCIUM PO   Cholecalciferol (VITAMIN D3) 50 MCG (2000 UT) TABS  gabapentin (NEURONTIN) 300 MG capsule   leflunomide (ARAVA) 10 MG tablet   lisinopril (ZESTRIL) 10 MG tablet   methotrexate (RHEUMATREX) 2.5 MG tablet   metoprolol succinate (TOPROL-XL) 25 MG 24 hr tablet   Multiple Vitamins-Minerals (PRESERVISION AREDS PO)   predniSONE (DELTASONE) 5 MG tablet   Propylene Glycol (SYSTANE COMPLETE) 0.6 % SOLN   RITUXAN 500 MG/50ML injection   rosuvastatin (CRESTOR) 20 MG tablet   sertraline (ZOLOFT) 50 MG tablet   spironolactone (ALDACTONE) 25 MG tablet   sulfaSALAzine (AZULFIDINE) 500 MG tablet   TESTOSTERONE IM   traMADol (ULTRAM) 50 MG tablet   No current facility-administered medications for this encounter.    Shonna Chock, PA-C Surgical Short Stay/Anesthesiology [facility] Phone [phone] [facility] Phone [phone] 04/04/2022 5:20 PM

## 2022-04-04 NOTE — Telephone Encounter (Signed)
Received a call from Anesthesiologist at [facility] about scheduling pt a exercise myoview test before surgery 04/11/22. Im unsure if the office, which is [facility], is actually requesting medical clearance or they just need stress test done or both. Please advise

## 2022-04-07 ENCOUNTER — Telehealth (HOSPITAL_COMMUNITY): Payer: Self-pay | Admitting: *Deleted

## 2022-04-07 ENCOUNTER — Encounter: Payer: Self-pay | Admitting: *Deleted

## 2022-04-07 NOTE — Telephone Encounter (Signed)
Reviewed with nurse navigator and PET scan was not approved by insurance.  Patient has been changed to exercise myoview on 12/6.  Patient aware of this appointment.  I verbally went over instructions with patient and will send copy to him through my chart

## 2022-04-07 NOTE — Telephone Encounter (Signed)
Patient given detailed instructions per Myocardial Perfusion Study Information Sheet for the test on 04/09/22 at 0715. Patient notified to arrive 15 minutes early and that it is imperative to arrive on time for appointment to keep from having the test rescheduled.  If you need to cancel or reschedule your appointment, please call the office within 24 hours of your appointment. . Patient verbalized understanding.Jose Alleyne, Adelene Idler

## 2022-04-08 ENCOUNTER — Encounter (HOSPITAL_COMMUNITY): Admission: RE | Admit: 2022-04-08 | Payer: BC Managed Care – PPO | Source: Ambulatory Visit

## 2022-04-09 ENCOUNTER — Ambulatory Visit (HOSPITAL_BASED_OUTPATIENT_CLINIC_OR_DEPARTMENT_OTHER): Payer: BC Managed Care – PPO

## 2022-04-09 ENCOUNTER — Other Ambulatory Visit: Payer: Self-pay | Admitting: Neurosurgery

## 2022-04-09 DIAGNOSIS — Z87891 Personal history of nicotine dependence: Secondary | ICD-10-CM | POA: Diagnosis not present

## 2022-04-09 DIAGNOSIS — I2584 Coronary atherosclerosis due to calcified coronary lesion: Secondary | ICD-10-CM | POA: Diagnosis not present

## 2022-04-09 DIAGNOSIS — R072 Precordial pain: Secondary | ICD-10-CM | POA: Diagnosis not present

## 2022-04-09 DIAGNOSIS — J342 Deviated nasal septum: Secondary | ICD-10-CM | POA: Diagnosis not present

## 2022-04-09 DIAGNOSIS — E785 Hyperlipidemia, unspecified: Secondary | ICD-10-CM | POA: Diagnosis not present

## 2022-04-09 DIAGNOSIS — I429 Cardiomyopathy, unspecified: Secondary | ICD-10-CM | POA: Diagnosis not present

## 2022-04-09 DIAGNOSIS — Z981 Arthrodesis status: Secondary | ICD-10-CM | POA: Diagnosis not present

## 2022-04-09 DIAGNOSIS — Z01818 Encounter for other preprocedural examination: Secondary | ICD-10-CM | POA: Diagnosis not present

## 2022-04-09 DIAGNOSIS — I2510 Atherosclerotic heart disease of native coronary artery without angina pectoris: Secondary | ICD-10-CM | POA: Diagnosis not present

## 2022-04-09 DIAGNOSIS — F32A Depression, unspecified: Secondary | ICD-10-CM | POA: Diagnosis not present

## 2022-04-09 DIAGNOSIS — Z96651 Presence of right artificial knee joint: Secondary | ICD-10-CM | POA: Diagnosis not present

## 2022-04-09 DIAGNOSIS — Z7982 Long term (current) use of aspirin: Secondary | ICD-10-CM | POA: Diagnosis not present

## 2022-04-09 DIAGNOSIS — Q282 Arteriovenous malformation of cerebral vessels: Secondary | ICD-10-CM | POA: Diagnosis not present

## 2022-04-09 DIAGNOSIS — Z7952 Long term (current) use of systemic steroids: Secondary | ICD-10-CM | POA: Diagnosis not present

## 2022-04-09 DIAGNOSIS — Z79899 Other long term (current) drug therapy: Secondary | ICD-10-CM | POA: Diagnosis not present

## 2022-04-09 DIAGNOSIS — I5022 Chronic systolic (congestive) heart failure: Secondary | ICD-10-CM | POA: Diagnosis not present

## 2022-04-09 DIAGNOSIS — R252 Cramp and spasm: Secondary | ICD-10-CM | POA: Diagnosis not present

## 2022-04-09 DIAGNOSIS — H3530 Unspecified macular degeneration: Secondary | ICD-10-CM | POA: Diagnosis not present

## 2022-04-09 DIAGNOSIS — F419 Anxiety disorder, unspecified: Secondary | ICD-10-CM | POA: Diagnosis not present

## 2022-04-09 DIAGNOSIS — M0530 Rheumatoid heart disease with rheumatoid arthritis of unspecified site: Secondary | ICD-10-CM | POA: Diagnosis not present

## 2022-04-09 DIAGNOSIS — M069 Rheumatoid arthritis, unspecified: Secondary | ICD-10-CM | POA: Diagnosis not present

## 2022-04-09 DIAGNOSIS — I110 Hypertensive heart disease with heart failure: Secondary | ICD-10-CM | POA: Diagnosis not present

## 2022-04-09 DIAGNOSIS — I10 Essential (primary) hypertension: Secondary | ICD-10-CM | POA: Diagnosis not present

## 2022-04-09 DIAGNOSIS — I252 Old myocardial infarction: Secondary | ICD-10-CM | POA: Diagnosis not present

## 2022-04-09 LAB — MYOCARDIAL PERFUSION IMAGING
Angina Index: 0
Duke Treadmill Score: 8
Estimated workload: 8.5
Exercise duration (min): 7 min
Exercise duration (sec): 30 s
LV dias vol: 141 mL (ref 62–150)
LV sys vol: 87 mL
MPHR: 160 {beats}/min
Nuc Stress EF: 39 %
Peak HR: 150 {beats}/min
Percent HR: 93 %
Rest HR: 56 {beats}/min
Rest Nuclear Isotope Dose: 10.8 mCi
SDS: 0
SRS: 0
SSS: 0
ST Depression (mm): 0 mm
Stress Nuclear Isotope Dose: 30.3 mCi
TID: 0.81

## 2022-04-09 MED ORDER — TECHNETIUM TC 99M TETROFOSMIN IV KIT
30.3000 | PACK | Freq: Once | INTRAVENOUS | Status: AC | PRN
  Administered 2022-04-09: 30.3 via INTRAVENOUS

## 2022-04-09 MED ORDER — TECHNETIUM TC 99M TETROFOSMIN IV KIT
10.8000 | PACK | Freq: Once | INTRAVENOUS | Status: AC | PRN
  Administered 2022-04-09: 10.8 via INTRAVENOUS

## 2022-04-10 ENCOUNTER — Telehealth: Payer: Self-pay | Admitting: Interventional Cardiology

## 2022-04-10 NOTE — Anesthesia Preprocedure Evaluation (Addendum)
Anesthesia Evaluation  Patient identified by MRN, date of birth, ID band Patient awake    Reviewed: Allergy & Precautions, NPO status , Patient's Chart, lab work & pertinent test results  Airway Mallampati: II  TM Distance: >3 FB Neck ROM: Full    Dental no notable dental hx.    Pulmonary sleep apnea , former smoker   Pulmonary exam normal        Cardiovascular hypertension, Pt. on medications and Pt. on home beta blockers + CAD and + Past MI   Rhythm:Regular Rate:Normal  Perfusion study 2023:   Findings are consistent with prior myocardial infarction. The study is intermediate risk.   No ST deviation was noted.   LV perfusion is abnormal. Defect 1: There is a small defect with mild reduction in uptake present in the mid inferolateral location(s) that is fixed. There is abnormal wall motion in the defect area. Consistent with infarction.   Left ventricular function is normal. Nuclear stress EF: 39 %. The left ventricular ejection fraction is moderately decreased (30-44%). End diastolic cavity size is moderately enlarged.   Prior study available for comparison from 04/26/2015.    Neuro/Psych  Headaches  Anxiety Depression    AVM    GI/Hepatic negative GI ROS, Neg liver ROS,,,  Endo/Other  negative endocrine ROS    Renal/GU negative Renal ROS  negative genitourinary   Musculoskeletal  (+) Arthritis , Osteoarthritis and Rheumatoid disorders,    Abdominal Normal abdominal exam  (+)   Peds  Hematology negative hematology ROS (+)   Anesthesia Other Findings   Reproductive/Obstetrics                             Anesthesia Physical Anesthesia Plan  ASA: 3  Anesthesia Plan: General   Post-op Pain Management:    Induction: Intravenous  PONV Risk Score and Plan: 2 and Ondansetron, Dexamethasone, Midazolam and Treatment may vary due to age or medical condition  Airway Management Planned:  Mask and Oral ETT  Additional Equipment: Arterial line  Intra-op Plan:   Post-operative Plan: Extubation in OR  Informed Consent: I have reviewed the patients History and Physical, chart, labs and discussed the procedure including the risks, benefits and alternatives for the proposed anesthesia with the patient or authorized representative who has indicated his/her understanding and acceptance.     Dental advisory given  Plan Discussed with: CRNA  Anesthesia Plan Comments: (Lab Results      Component                Value               Date                      WBC                      8.3                 04/03/2022                HGB                      13.8                04/03/2022                HCT  40.7                04/03/2022                MCV                      91.3                04/03/2022                PLT                      206                 04/03/2022            Lab Results      Component                Value               Date                      NA                       140                 01/28/2022                K                        4.1                 01/28/2022                CO2                      29                  01/28/2022                GLUCOSE                  102 (H)             01/28/2022                BUN                      11                  01/28/2022                CREATININE               1.01                01/28/2022                CALCIUM                  8.9                 01/28/2022                GFRNONAA                 >60  01/28/2022             PAT note written by Shonna Chock, PA-C.  Nuclear stress test 04/09/22. Per cardiologist Dr. Eldridge Dace, "No reversible ischemia. EF is stable from 2019 echo. Findings consistent with nonischemic cardiomyopathy. No further cardiac testing needed before AVM surgery. Avoid excessive IV fluids at the time of surgery to prevent volume  overload. " )       Anesthesia Quick Evaluation

## 2022-04-10 NOTE — Telephone Encounter (Signed)
Patient is returning call.  °

## 2022-04-10 NOTE — Telephone Encounter (Signed)
I spoke with patient.  See phone note

## 2022-04-10 NOTE — Telephone Encounter (Signed)
I spoke with patient and reviewed stress test results with him 

## 2022-04-10 NOTE — Telephone Encounter (Signed)
Orvan Seen from [facility] called about patient's stress test he had done yesterday.  It came back abnormal, patient is schedule for surgery tomorrow.  They wanted to make sure doctor was away of this.

## 2022-04-10 NOTE — Telephone Encounter (Signed)
Call returned to Anesthesiology.  Message will be given to [facility]. Call placed to patient to review results.  Left message to call office.

## 2022-04-10 NOTE — Telephone Encounter (Signed)
Pam, I can't find Allison's contact, but I cleared him for surgery.  JV

## 2022-04-11 ENCOUNTER — Other Ambulatory Visit: Payer: Self-pay

## 2022-04-11 ENCOUNTER — Inpatient Hospital Stay (HOSPITAL_COMMUNITY)
Admission: RE | Admit: 2022-04-11 | Discharge: 2022-04-17 | DRG: 026 | Disposition: A | Discharge status: Home / Self Care | Payer: BC Managed Care – PPO | Attending: Neurosurgery | Admitting: Neurosurgery

## 2022-04-11 ENCOUNTER — Encounter (HOSPITAL_COMMUNITY): Payer: Self-pay | Admitting: Neurosurgery

## 2022-04-11 ENCOUNTER — Inpatient Hospital Stay (HOSPITAL_COMMUNITY): Payer: BC Managed Care – PPO | Admitting: Vascular Surgery

## 2022-04-11 ENCOUNTER — Inpatient Hospital Stay (HOSPITAL_COMMUNITY)
Admission: RE | Disposition: A | Discharge status: Home / Self Care | Payer: Self-pay | Source: Home / Self Care | Attending: Neurosurgery

## 2022-04-11 DIAGNOSIS — J342 Deviated nasal septum: Secondary | ICD-10-CM | POA: Diagnosis present

## 2022-04-11 DIAGNOSIS — Z7952 Long term (current) use of systemic steroids: Secondary | ICD-10-CM | POA: Diagnosis not present

## 2022-04-11 DIAGNOSIS — F419 Anxiety disorder, unspecified: Secondary | ICD-10-CM | POA: Diagnosis present

## 2022-04-11 DIAGNOSIS — I2510 Atherosclerotic heart disease of native coronary artery without angina pectoris: Secondary | ICD-10-CM | POA: Diagnosis present

## 2022-04-11 DIAGNOSIS — I252 Old myocardial infarction: Secondary | ICD-10-CM | POA: Diagnosis not present

## 2022-04-11 DIAGNOSIS — Z981 Arthrodesis status: Secondary | ICD-10-CM | POA: Diagnosis not present

## 2022-04-11 DIAGNOSIS — Z87891 Personal history of nicotine dependence: Secondary | ICD-10-CM | POA: Diagnosis not present

## 2022-04-11 DIAGNOSIS — Z96651 Presence of right artificial knee joint: Secondary | ICD-10-CM | POA: Diagnosis present

## 2022-04-11 DIAGNOSIS — M069 Rheumatoid arthritis, unspecified: Secondary | ICD-10-CM | POA: Diagnosis present

## 2022-04-11 DIAGNOSIS — Z79899 Other long term (current) drug therapy: Secondary | ICD-10-CM | POA: Diagnosis not present

## 2022-04-11 DIAGNOSIS — Q282 Arteriovenous malformation of cerebral vessels: Secondary | ICD-10-CM | POA: Diagnosis present

## 2022-04-11 DIAGNOSIS — F32A Depression, unspecified: Secondary | ICD-10-CM | POA: Diagnosis present

## 2022-04-11 DIAGNOSIS — I429 Cardiomyopathy, unspecified: Secondary | ICD-10-CM | POA: Diagnosis present

## 2022-04-11 DIAGNOSIS — Z7982 Long term (current) use of aspirin: Secondary | ICD-10-CM | POA: Diagnosis not present

## 2022-04-11 DIAGNOSIS — R252 Cramp and spasm: Secondary | ICD-10-CM | POA: Diagnosis not present

## 2022-04-11 DIAGNOSIS — H3530 Unspecified macular degeneration: Secondary | ICD-10-CM | POA: Diagnosis present

## 2022-04-11 DIAGNOSIS — I110 Hypertensive heart disease with heart failure: Secondary | ICD-10-CM | POA: Diagnosis present

## 2022-04-11 DIAGNOSIS — M0530 Rheumatoid heart disease with rheumatoid arthritis of unspecified site: Secondary | ICD-10-CM | POA: Diagnosis present

## 2022-04-11 DIAGNOSIS — Z9889 Other specified postprocedural states: Secondary | ICD-10-CM

## 2022-04-11 DIAGNOSIS — Z01818 Encounter for other preprocedural examination: Secondary | ICD-10-CM

## 2022-04-11 DIAGNOSIS — I5022 Chronic systolic (congestive) heart failure: Secondary | ICD-10-CM | POA: Diagnosis present

## 2022-04-11 DIAGNOSIS — E785 Hyperlipidemia, unspecified: Secondary | ICD-10-CM | POA: Diagnosis present

## 2022-04-11 HISTORY — PX: CRANIOTOMY: SHX93

## 2022-04-11 HISTORY — PX: APPLICATION OF CRANIAL NAVIGATION: SHX6578

## 2022-04-11 LAB — PREPARE RBC (CROSSMATCH)

## 2022-04-11 LAB — MRSA NEXT GEN BY PCR, NASAL: MRSA by PCR Next Gen: NOT DETECTED

## 2022-04-11 SURGERY — CRANIOTOMY INTRACRANIAL ARTERIO-VENOUS MALFORMATION DURAL COMPLEX (AVM)
Anesthesia: General

## 2022-04-11 MED ORDER — ARTIFICIAL TEARS OPHTHALMIC OINT
TOPICAL_OINTMENT | OPHTHALMIC | Status: AC
  Filled 2022-04-11: qty 3.5

## 2022-04-11 MED ORDER — FENTANYL CITRATE (PF) 100 MCG/2ML IJ SOLN
INTRAMUSCULAR | Status: AC
  Filled 2022-04-11: qty 2

## 2022-04-11 MED ORDER — SODIUM CHLORIDE 0.9 % IV SOLN: INTRAVENOUS | Status: DC

## 2022-04-11 MED ORDER — BUPIVACAINE HCL 0.5 % IJ SOLN
INTRAMUSCULAR | Status: DC | PRN
  Administered 2022-04-11: 5 mL

## 2022-04-11 MED ORDER — EPHEDRINE SULFATE-NACL 50-0.9 MG/10ML-% IV SOSY
PREFILLED_SYRINGE | INTRAVENOUS | Status: DC | PRN
  Administered 2022-04-11 (×2): 5 mg via INTRAVENOUS

## 2022-04-11 MED ORDER — FENTANYL CITRATE (PF) 100 MCG/2ML IJ SOLN
25.0000 ug | INTRAMUSCULAR | Status: DC | PRN
  Administered 2022-04-11 (×2): 25 ug via INTRAVENOUS

## 2022-04-11 MED ORDER — PROPOFOL 10 MG/ML IV BOLUS
INTRAVENOUS | Status: AC
  Filled 2022-04-11: qty 20

## 2022-04-11 MED ORDER — MICROFIBRILLAR COLL HEMOSTAT EX PADS: MEDICATED_PAD | CUTANEOUS | Status: DC | PRN

## 2022-04-11 MED ORDER — PHENYLEPHRINE HCL-NACL 20-0.9 MG/250ML-% IV SOLN
INTRAVENOUS | Status: DC | PRN
  Administered 2022-04-11: 25 ug/min via INTRAVENOUS

## 2022-04-11 MED ORDER — FENTANYL CITRATE (PF) 250 MCG/5ML IJ SOLN
INTRAMUSCULAR | Status: DC | PRN
  Administered 2022-04-11 (×3): 50 ug via INTRAVENOUS
  Administered 2022-04-11: 100 ug via INTRAVENOUS

## 2022-04-11 MED ORDER — ACETAMINOPHEN 10 MG/ML IV SOLN: 1000.0000 mg | Freq: Once | INTRAVENOUS | Status: DC | PRN

## 2022-04-11 MED ORDER — FENTANYL CITRATE (PF) 250 MCG/5ML IJ SOLN
INTRAMUSCULAR | Status: AC
  Filled 2022-04-11: qty 5

## 2022-04-11 MED ORDER — HEMOSTATIC AGENTS (NO CHARGE) OPTIME
TOPICAL | Status: DC | PRN
  Administered 2022-04-11: 1 via TOPICAL

## 2022-04-11 MED ORDER — LIDOCAINE 2% (20 MG/ML) 5 ML SYRINGE
INTRAMUSCULAR | Status: DC | PRN
  Administered 2022-04-11: 80 mg via INTRAVENOUS

## 2022-04-11 MED ORDER — THROMBIN 20000 UNITS EX SOLR
CUTANEOUS | Status: AC
  Filled 2022-04-11: qty 20000

## 2022-04-11 MED ORDER — POLYVINYL ALCOHOL 1.4 % OP SOLN
1.0000 [drp] | Freq: Two times a day (BID) | OPHTHALMIC | Status: DC
  Administered 2022-04-12 – 2022-04-17 (×10): 1 [drp] via OPHTHALMIC
  Filled 2022-04-11: qty 15

## 2022-04-11 MED ORDER — GABAPENTIN 300 MG PO CAPS
300.0000 mg | ORAL_CAPSULE | Freq: Two times a day (BID) | ORAL | Status: DC
  Administered 2022-04-12 – 2022-04-17 (×11): 300 mg via ORAL
  Filled 2022-04-11 (×12): qty 1

## 2022-04-11 MED ORDER — CHLORHEXIDINE GLUCONATE 0.12 % MT SOLN
15.0000 mL | Freq: Once | OROMUCOSAL | Status: AC
  Administered 2022-04-11: 15 mL via OROMUCOSAL
  Filled 2022-04-11: qty 15

## 2022-04-11 MED ORDER — PROMETHAZINE HCL 25 MG PO TABS
12.5000 mg | ORAL_TABLET | ORAL | Status: DC | PRN
  Administered 2022-04-12: 25 mg via ORAL
  Filled 2022-04-11: qty 1

## 2022-04-11 MED ORDER — MORPHINE SULFATE (PF) 2 MG/ML IV SOLN
1.0000 mg | INTRAVENOUS | Status: DC | PRN
  Administered 2022-04-11 – 2022-04-13 (×3): 2 mg via INTRAVENOUS
  Filled 2022-04-11 (×3): qty 1

## 2022-04-11 MED ORDER — CHLORHEXIDINE GLUCONATE CLOTH 2 % EX PADS
6.0000 | MEDICATED_PAD | Freq: Every day | CUTANEOUS | Status: DC
  Administered 2022-04-11 – 2022-04-15 (×5): 6 via TOPICAL

## 2022-04-11 MED ORDER — ROCURONIUM BROMIDE 10 MG/ML (PF) SYRINGE
PREFILLED_SYRINGE | INTRAVENOUS | Status: AC
  Filled 2022-04-11: qty 20

## 2022-04-11 MED ORDER — CHLORHEXIDINE GLUCONATE CLOTH 2 % EX PADS: 6.0000 | MEDICATED_PAD | Freq: Once | CUTANEOUS | Status: DC

## 2022-04-11 MED ORDER — SPIRONOLACTONE 25 MG PO TABS
25.0000 mg | ORAL_TABLET | Freq: Every day | ORAL | Status: DC
  Administered 2022-04-13 – 2022-04-17 (×4): 25 mg via ORAL
  Filled 2022-04-11 (×5): qty 1

## 2022-04-11 MED ORDER — DEXAMETHASONE SODIUM PHOSPHATE 10 MG/ML IJ SOLN
INTRAMUSCULAR | Status: DC | PRN
  Administered 2022-04-11: 10 mg via INTRAVENOUS

## 2022-04-11 MED ORDER — PHENYLEPHRINE 80 MCG/ML (10ML) SYRINGE FOR IV PUSH (FOR BLOOD PRESSURE SUPPORT)
PREFILLED_SYRINGE | INTRAVENOUS | Status: DC | PRN
  Administered 2022-04-11: 160 ug via INTRAVENOUS
  Administered 2022-04-11: 80 ug via INTRAVENOUS

## 2022-04-11 MED ORDER — PROSIGHT PO TABS
ORAL_TABLET | Freq: Every day | ORAL | Status: DC
  Administered 2022-04-12 – 2022-04-14 (×3): 1 via ORAL
  Filled 2022-04-11 (×3): qty 1

## 2022-04-11 MED ORDER — SODIUM CHLORIDE 0.9 % IV SOLN
0.0125 ug/kg/min | INTRAVENOUS | Status: AC
  Administered 2022-04-11 (×3): .2 ug/kg/min via INTRAVENOUS
  Filled 2022-04-11: qty 2000

## 2022-04-11 MED ORDER — PANTOPRAZOLE SODIUM 40 MG IV SOLR
40.0000 mg | Freq: Every day | INTRAVENOUS | Status: DC
  Administered 2022-04-12 – 2022-04-13 (×2): 40 mg via INTRAVENOUS
  Filled 2022-04-11 (×3): qty 10

## 2022-04-11 MED ORDER — HYDROCODONE-ACETAMINOPHEN 5-325 MG PO TABS
1.0000 | ORAL_TABLET | ORAL | Status: DC | PRN
  Administered 2022-04-11 – 2022-04-17 (×23): 1 via ORAL
  Filled 2022-04-11 (×24): qty 1

## 2022-04-11 MED ORDER — ONDANSETRON HCL 4 MG/2ML IJ SOLN
INTRAMUSCULAR | Status: AC
  Filled 2022-04-11: qty 2

## 2022-04-11 MED ORDER — CEFAZOLIN SODIUM-DEXTROSE 2-3 GM-%(50ML) IV SOLR
INTRAVENOUS | Status: DC | PRN
  Administered 2022-04-11 (×2): 2 g via INTRAVENOUS

## 2022-04-11 MED ORDER — THROMBIN 5000 UNITS EX SOLR
OROMUCOSAL | Status: DC | PRN
  Administered 2022-04-11 (×2): 5 mL via TOPICAL

## 2022-04-11 MED ORDER — ONDANSETRON HCL 4 MG/2ML IJ SOLN
4.0000 mg | INTRAMUSCULAR | Status: DC | PRN
  Administered 2022-04-11 – 2022-04-13 (×2): 4 mg via INTRAVENOUS
  Filled 2022-04-11 (×2): qty 2

## 2022-04-11 MED ORDER — EPHEDRINE 5 MG/ML INJ
INTRAVENOUS | Status: AC
  Filled 2022-04-11: qty 5

## 2022-04-11 MED ORDER — ACETAMINOPHEN 650 MG RE SUPP: 650.0000 mg | RECTAL | Status: DC | PRN

## 2022-04-11 MED ORDER — THROMBIN 20000 UNITS EX SOLR
CUTANEOUS | Status: DC | PRN
  Administered 2022-04-11: 20 mL via TOPICAL

## 2022-04-11 MED ORDER — THROMBIN 5000 UNITS EX SOLR
CUTANEOUS | Status: AC
  Filled 2022-04-11: qty 5000

## 2022-04-11 MED ORDER — DEXAMETHASONE SODIUM PHOSPHATE 10 MG/ML IJ SOLN
6.0000 mg | Freq: Four times a day (QID) | INTRAMUSCULAR | Status: AC
  Administered 2022-04-11 – 2022-04-12 (×4): 6 mg via INTRAVENOUS
  Filled 2022-04-11 (×4): qty 1

## 2022-04-11 MED ORDER — SENNOSIDES-DOCUSATE SODIUM 8.6-50 MG PO TABS: 1.0000 | ORAL_TABLET | Freq: Every evening | ORAL | Status: DC | PRN

## 2022-04-11 MED ORDER — DEXAMETHASONE SODIUM PHOSPHATE 4 MG/ML IJ SOLN
4.0000 mg | Freq: Three times a day (TID) | INTRAMUSCULAR | Status: DC
  Administered 2022-04-13 – 2022-04-17 (×11): 4 mg via INTRAVENOUS
  Filled 2022-04-11 (×11): qty 1

## 2022-04-11 MED ORDER — CLEVIDIPINE BUTYRATE 0.5 MG/ML IV EMUL
1.0000 mg/h | INTRAVENOUS | Status: DC
  Administered 2022-04-11: 6 mg/h via INTRAVENOUS
  Administered 2022-04-11: 4 mg/h via INTRAVENOUS
  Administered 2022-04-11: 2 mg/h via INTRAVENOUS
  Filled 2022-04-11: qty 50
  Filled 2022-04-11: qty 100
  Filled 2022-04-11: qty 50

## 2022-04-11 MED ORDER — ONDANSETRON HCL 4 MG/2ML IJ SOLN
INTRAMUSCULAR | Status: AC
  Administered 2022-04-12: 4 mg via INTRAVENOUS
  Filled 2022-04-11: qty 2

## 2022-04-11 MED ORDER — CEFAZOLIN SODIUM-DEXTROSE 2-4 GM/100ML-% IV SOLN
2.0000 g | INTRAVENOUS | Status: DC
  Filled 2022-04-11: qty 100

## 2022-04-11 MED ORDER — LABETALOL HCL 5 MG/ML IV SOLN
INTRAVENOUS | Status: DC | PRN
  Administered 2022-04-11 (×2): 5 mg via INTRAVENOUS
  Administered 2022-04-11: 10 mg via INTRAVENOUS

## 2022-04-11 MED ORDER — SODIUM CHLORIDE 0.9 % IV SOLN
0.0125 ug/kg/min | INTRAVENOUS | Status: DC
  Filled 2022-04-11: qty 2000

## 2022-04-11 MED ORDER — ONDANSETRON HCL 4 MG/2ML IJ SOLN
INTRAMUSCULAR | Status: DC | PRN
  Administered 2022-04-11: 4 mg via INTRAVENOUS

## 2022-04-11 MED ORDER — DEXAMETHASONE SODIUM PHOSPHATE 4 MG/ML IJ SOLN
4.0000 mg | Freq: Four times a day (QID) | INTRAMUSCULAR | Status: AC
  Administered 2022-04-12 – 2022-04-13 (×4): 4 mg via INTRAVENOUS
  Filled 2022-04-11 (×4): qty 1

## 2022-04-11 MED ORDER — BACITRACIN ZINC 500 UNIT/GM EX OINT
TOPICAL_OINTMENT | CUTANEOUS | Status: DC | PRN
  Administered 2022-04-11: 1 via TOPICAL

## 2022-04-11 MED ORDER — CEFAZOLIN SODIUM-DEXTROSE 2-4 GM/100ML-% IV SOLN
2.0000 g | Freq: Three times a day (TID) | INTRAVENOUS | Status: AC
  Administered 2022-04-11 – 2022-04-12 (×2): 2 g via INTRAVENOUS
  Filled 2022-04-11 (×2): qty 100

## 2022-04-11 MED ORDER — BUPIVACAINE HCL (PF) 0.5 % IJ SOLN
INTRAMUSCULAR | Status: AC
  Filled 2022-04-11: qty 30

## 2022-04-11 MED ORDER — PHENYLEPHRINE 80 MCG/ML (10ML) SYRINGE FOR IV PUSH (FOR BLOOD PRESSURE SUPPORT)
PREFILLED_SYRINGE | INTRAVENOUS | Status: AC
  Filled 2022-04-11: qty 10

## 2022-04-11 MED ORDER — SERTRALINE HCL 50 MG PO TABS
50.0000 mg | ORAL_TABLET | Freq: Every morning | ORAL | Status: DC
  Administered 2022-04-12 – 2022-04-17 (×6): 50 mg via ORAL
  Filled 2022-04-11 (×6): qty 1

## 2022-04-11 MED ORDER — SULFASALAZINE 500 MG PO TABS
1000.0000 mg | ORAL_TABLET | Freq: Two times a day (BID) | ORAL | Status: DC
  Administered 2022-04-12 – 2022-04-17 (×11): 1000 mg via ORAL
  Filled 2022-04-11 (×14): qty 2

## 2022-04-11 MED ORDER — ACETAMINOPHEN 325 MG PO TABS
650.0000 mg | ORAL_TABLET | ORAL | Status: DC | PRN
  Administered 2022-04-15 – 2022-04-17 (×6): 650 mg via ORAL
  Filled 2022-04-11 (×6): qty 2

## 2022-04-11 MED ORDER — CLEVIDIPINE BUTYRATE 0.5 MG/ML IV EMUL
INTRAVENOUS | Status: AC
  Filled 2022-04-11: qty 50

## 2022-04-11 MED ORDER — BISACODYL 10 MG RE SUPP: 10.0000 mg | Freq: Every day | RECTAL | Status: DC | PRN

## 2022-04-11 MED ORDER — METOPROLOL SUCCINATE ER 25 MG PO TB24
25.0000 mg | ORAL_TABLET | Freq: Every day | ORAL | Status: DC
  Administered 2022-04-13 – 2022-04-17 (×3): 25 mg via ORAL
  Filled 2022-04-11 (×5): qty 1

## 2022-04-11 MED ORDER — GLYCOPYRROLATE PF 0.2 MG/ML IJ SOSY
PREFILLED_SYRINGE | INTRAMUSCULAR | Status: DC | PRN
  Administered 2022-04-11: .2 mg via INTRAVENOUS

## 2022-04-11 MED ORDER — LIDOCAINE 2% (20 MG/ML) 5 ML SYRINGE
INTRAMUSCULAR | Status: AC
  Filled 2022-04-11: qty 5

## 2022-04-11 MED ORDER — ORAL CARE MOUTH RINSE: 15.0000 mL | Freq: Once | OROMUCOSAL | Status: AC

## 2022-04-11 MED ORDER — ROSUVASTATIN CALCIUM 20 MG PO TABS
20.0000 mg | ORAL_TABLET | Freq: Every day | ORAL | Status: DC
  Administered 2022-04-12 – 2022-04-17 (×6): 20 mg via ORAL
  Filled 2022-04-11 (×6): qty 1

## 2022-04-11 MED ORDER — ACETAMINOPHEN 10 MG/ML IV SOLN
INTRAVENOUS | Status: DC | PRN
  Administered 2022-04-11: 1000 mg via INTRAVENOUS

## 2022-04-11 MED ORDER — ARTIFICIAL TEARS OPHTHALMIC OINT
TOPICAL_OINTMENT | OPHTHALMIC | Status: DC | PRN
  Administered 2022-04-11: 1 via OPHTHALMIC

## 2022-04-11 MED ORDER — SUGAMMADEX SODIUM 200 MG/2ML IV SOLN
INTRAVENOUS | Status: DC | PRN
  Administered 2022-04-11: 215 mg via INTRAVENOUS

## 2022-04-11 MED ORDER — DEXAMETHASONE SODIUM PHOSPHATE 10 MG/ML IJ SOLN
INTRAMUSCULAR | Status: AC
  Filled 2022-04-11: qty 1

## 2022-04-11 MED ORDER — ONDANSETRON HCL 4 MG PO TABS
4.0000 mg | ORAL_TABLET | ORAL | Status: DC | PRN
  Administered 2022-04-13: 4 mg via ORAL
  Filled 2022-04-11: qty 1

## 2022-04-11 MED ORDER — PROPOFOL 10 MG/ML IV BOLUS
INTRAVENOUS | Status: DC | PRN
  Administered 2022-04-11: 160 mg via INTRAVENOUS
  Administered 2022-04-11: 40 mg via INTRAVENOUS

## 2022-04-11 MED ORDER — LISINOPRIL 10 MG PO TABS
10.0000 mg | ORAL_TABLET | Freq: Every day | ORAL | Status: DC
  Administered 2022-04-15 – 2022-04-17 (×2): 10 mg via ORAL
  Filled 2022-04-11 (×5): qty 1

## 2022-04-11 MED ORDER — SODIUM CHLORIDE 0.9 % IV SOLN: 10.0000 mL/h | Freq: Once | INTRAVENOUS | Status: DC

## 2022-04-11 MED ORDER — 0.9 % SODIUM CHLORIDE (POUR BTL) OPTIME
TOPICAL | Status: DC | PRN
  Administered 2022-04-11 (×2): 1000 mL
  Administered 2022-04-11: 2000 mL

## 2022-04-11 MED ORDER — LEFLUNOMIDE 10 MG PO TABS
10.0000 mg | ORAL_TABLET | Freq: Every day | ORAL | Status: DC
  Administered 2022-04-12 – 2022-04-17 (×6): 10 mg via ORAL
  Filled 2022-04-11 (×6): qty 1

## 2022-04-11 MED ORDER — SODIUM CHLORIDE 0.9 % IV SOLN: INTRAVENOUS | Status: DC | PRN

## 2022-04-11 MED ORDER — BACITRACIN ZINC 500 UNIT/GM EX OINT
TOPICAL_OINTMENT | CUTANEOUS | Status: AC
  Filled 2022-04-11: qty 28.35

## 2022-04-11 MED ORDER — LIDOCAINE HCL (PF) 1 % IJ SOLN
INTRAMUSCULAR | Status: DC | PRN
  Administered 2022-04-11: 5 mL

## 2022-04-11 MED ORDER — ROCURONIUM BROMIDE 10 MG/ML (PF) SYRINGE
PREFILLED_SYRINGE | INTRAVENOUS | Status: DC | PRN
  Administered 2022-04-11: 60 mg via INTRAVENOUS
  Administered 2022-04-11 (×2): 50 mg via INTRAVENOUS
  Administered 2022-04-11: 20 mg via INTRAVENOUS
  Administered 2022-04-11 (×2): 40 mg via INTRAVENOUS

## 2022-04-11 MED ORDER — ALBUMIN HUMAN 5 % IV SOLN: INTRAVENOUS | Status: DC | PRN

## 2022-04-11 SURGICAL SUPPLY — 89 items
BAG COUNTER SPONGE SURGICOUNT (BAG) ×1 IMPLANT
BAND RUBBER #18 3X1/16 STRL (MISCELLANEOUS) ×2 IMPLANT
BASKET BONE COLLECTION (BASKET) IMPLANT
BENZOIN TINCTURE PRP APPL 2/3 (GAUZE/BANDAGES/DRESSINGS) IMPLANT
BIT DRILL WIRE PASS 1.3MM (BIT) IMPLANT
BLADE SAW GIGLI 16 STRL (MISCELLANEOUS) IMPLANT
BLADE SURG 15 STRL LF DISP TIS (BLADE) ×1 IMPLANT
BLADE SURG 15 STRL SS (BLADE) ×1
BLADE ULTRA TIP 2M (BLADE) IMPLANT
BNDG GAUZE DERMACEA FLUFF 4 (GAUZE/BANDAGES/DRESSINGS) IMPLANT
BNDG STRETCH 4X75 STRL LF (GAUZE/BANDAGES/DRESSINGS) IMPLANT
BUR ACORN 6.0 PRECISION (BURR) ×1 IMPLANT
BUR MATCHSTICK NEURO 3.0 LAGG (BURR) IMPLANT
BUR ROUND FLUTED 4 SOFT TCH (BURR) IMPLANT
BUR SPIRAL ROUTER 2.3 (BUR) IMPLANT
CABLE BIPOLOR RESECTION CORD (MISCELLANEOUS) ×1 IMPLANT
CANISTER SUCT 3000ML PPV (MISCELLANEOUS) ×1 IMPLANT
CLIP TI MEDIUM 6 (CLIP) IMPLANT
CLIP VESOCCLUDE MED 6/CT (CLIP) IMPLANT
CNTNR URN SCR LID CUP LEK RST (MISCELLANEOUS) IMPLANT
CONT SPEC 4OZ STRL OR WHT (MISCELLANEOUS) ×1
COTTONBALL LRG STERILE PKG (GAUZE/BANDAGES/DRESSINGS) IMPLANT
COVERAGE SUPPORT O-ARM STEALTH (MISCELLANEOUS) ×1 IMPLANT
DERMABOND ADVANCED .7 DNX12 (GAUZE/BANDAGES/DRESSINGS) IMPLANT
DRAPE MICROSCOPE SLANT 54X150 (MISCELLANEOUS) ×1 IMPLANT
DRAPE NEUROLOGICAL W/INCISE (DRAPES) ×1 IMPLANT
DRAPE WARM FLUID 44X44 (DRAPES) ×1 IMPLANT
DRILL WIRE PASS 1.3MM (BIT)
DRSG ADAPTIC 3X8 NADH LF (GAUZE/BANDAGES/DRESSINGS) ×1 IMPLANT
DRSG OPSITE POSTOP 4X8 (GAUZE/BANDAGES/DRESSINGS) IMPLANT
DRSG TELFA 3X8 NADH STRL (GAUZE/BANDAGES/DRESSINGS) ×1 IMPLANT
DURAPREP 6ML APPLICATOR 50/CS (WOUND CARE) ×1 IMPLANT
ELECT REM PT RETURN 9FT ADLT (ELECTROSURGICAL) ×1
ELECTRODE REM PT RTRN 9FT ADLT (ELECTROSURGICAL) ×1 IMPLANT
EVACUATOR SILICONE 100CC (DRAIN) IMPLANT
FEE COVERAGE SUPPORT O-ARM (MISCELLANEOUS) ×1 IMPLANT
FORCEPS BIPOLAR SPETZLER 8 1.0 (NEUROSURGERY SUPPLIES) IMPLANT
GAUZE 4X4 16PLY ~~LOC~~+RFID DBL (SPONGE) IMPLANT
GAUZE SPONGE 4X4 12PLY STRL (GAUZE/BANDAGES/DRESSINGS) IMPLANT
GLOVE BIO SURGEON STRL SZ7.5 (GLOVE) IMPLANT
GLOVE BIOGEL PI IND STRL 7.0 (GLOVE) IMPLANT
GLOVE BIOGEL PI IND STRL 7.5 (GLOVE) ×2 IMPLANT
GLOVE ECLIPSE 7.0 STRL STRAW (GLOVE) ×2 IMPLANT
GLOVE EXAM NITRILE XL STR (GLOVE) IMPLANT
GOWN STRL REUS W/ TWL LRG LVL3 (GOWN DISPOSABLE) ×2 IMPLANT
GOWN STRL REUS W/ TWL XL LVL3 (GOWN DISPOSABLE) IMPLANT
GOWN STRL REUS W/TWL 2XL LVL3 (GOWN DISPOSABLE) IMPLANT
GOWN STRL REUS W/TWL LRG LVL3 (GOWN DISPOSABLE) ×2
GOWN STRL REUS W/TWL XL LVL3 (GOWN DISPOSABLE)
GRAFT DURAGEN MATRIX 3WX3L (Graft) ×1 IMPLANT
GRAFT DURAGEN MATRIX 3X3 SNGL (Graft) IMPLANT
HEMOSTAT POWDER KIT SURGIFOAM (HEMOSTASIS) ×1 IMPLANT
HEMOSTAT SURGICEL 2X14 (HEMOSTASIS) ×1 IMPLANT
HOOK DURA (MISCELLANEOUS) ×1 IMPLANT
KIT BASIN OR (CUSTOM PROCEDURE TRAY) ×1 IMPLANT
KIT DRAIN CSF ACCUDRAIN (MISCELLANEOUS) IMPLANT
KIT TURNOVER KIT B (KITS) ×1 IMPLANT
MARKER SPHERE PSV REFLC NDI (MISCELLANEOUS) ×3 IMPLANT
NEEDLE HYPO 22GX1.5 SAFETY (NEEDLE) ×1 IMPLANT
NS IRRIG 1000ML POUR BTL (IV SOLUTION) ×1 IMPLANT
PACK CRANIOTOMY CUSTOM (CUSTOM PROCEDURE TRAY) ×1 IMPLANT
PAD ARMBOARD 7.5X6 YLW CONV (MISCELLANEOUS) ×1 IMPLANT
PATTIES SURGICAL .25X.25 (GAUZE/BANDAGES/DRESSINGS) IMPLANT
PATTIES SURGICAL .5 X.5 (GAUZE/BANDAGES/DRESSINGS) IMPLANT
PATTIES SURGICAL .5 X3 (DISPOSABLE) IMPLANT
PATTIES SURGICAL 1/4 X 3 (GAUZE/BANDAGES/DRESSINGS) IMPLANT
PATTIES SURGICAL 1X1 (DISPOSABLE) IMPLANT
PIN MAYFIELD SKULL DISP (PIN) IMPLANT
SEALANT ADHERUS EXTEND TIP (MISCELLANEOUS) IMPLANT
SPIKE FLUID TRANSFER (MISCELLANEOUS) ×1 IMPLANT
SPONGE NEURO XRAY DETECT 1X3 (DISPOSABLE) IMPLANT
SPONGE SURGIFOAM ABS GEL 100 (HEMOSTASIS) ×1 IMPLANT
SPONGE SURGIFOAM ABS GEL 100C (HEMOSTASIS) IMPLANT
STAPLER VISISTAT 35W (STAPLE) ×1 IMPLANT
STOCKINETTE 6  STRL (DRAPES) ×1
STOCKINETTE 6 STRL (DRAPES) ×1 IMPLANT
SUT BONE WAX W31G (SUTURE) IMPLANT
SUT ETHILON 3 0 FSL (SUTURE) IMPLANT
SUT NURALON 4 0 TR CR/8 (SUTURE) ×2 IMPLANT
SUT VIC AB 0 CT1 18XCR BRD8 (SUTURE) ×2 IMPLANT
SUT VIC AB 0 CT1 8-18 (SUTURE) ×3
SUT VIC AB 3-0 SH 8-18 (SUTURE) ×2 IMPLANT
SUT VICRYL 3-0 RB1 18 ABS (SUTURE) IMPLANT
TAPE CLOTH 1X10 TAN NS (GAUZE/BANDAGES/DRESSINGS) ×1 IMPLANT
TOWEL GREEN STERILE (TOWEL DISPOSABLE) ×1 IMPLANT
TOWEL GREEN STERILE FF (TOWEL DISPOSABLE) ×1 IMPLANT
TRAY FOLEY MTR SLVR 16FR STAT (SET/KITS/TRAYS/PACK) IMPLANT
UNDERPAD 30X36 HEAVY ABSORB (UNDERPADS AND DIAPERS) IMPLANT
WATER STERILE IRR 1000ML POUR (IV SOLUTION) ×1 IMPLANT

## 2022-04-11 NOTE — Anesthesia Procedure Notes (Signed)
Procedure Name: Intubation Date/Time: 04/11/2022 8:43 AM  Performed by: Bryson Corona, CRNAPre-anesthesia Checklist: Patient identified, Emergency Drugs available, Suction available and Patient being monitored Patient Re-evaluated:Patient Re-evaluated prior to induction Oxygen Delivery Method: Circle System Utilized Preoxygenation: Pre-oxygenation with 100% oxygen Induction Type: IV induction Ventilation: Oral airway inserted - appropriate to patient size Laryngoscope Size: Mac and 4 Grade View: Grade II Tube type: Oral Tube size: 7.5 mm Number of attempts: 1 Airway Equipment and Method: Stylet and Oral airway Placement Confirmation: ETT inserted through vocal cords under direct vision, positive ETCO2 and breath sounds checked- equal and bilateral Secured at: 22 cm Tube secured with: Tape Dental Injury: Teeth and Oropharynx as per pre-operative assessment

## 2022-04-11 NOTE — Brief Op Note (Signed)
04/11/2022  4:09 PM  PATIENT:  Christopher Yu  61 y.o. male  PRE-OPERATIVE DIAGNOSIS:  ARTERIOVENOUS MALFORMATION OF BRAIN  POST-OPERATIVE DIAGNOSIS:  ARTERIOVENOUS MALFORMATION OF BRAIN  PROCEDURE:  Procedure(s): STEREOTACTIC SUBOCCIPITAL CRANIOTOMY FOR RESECTION OF ARTERIO-VENOUS MALFORMATION (N/A) APPLICATION OF CRANIAL NAVIGATION (N/A)  SURGEON:  Surgeon(s) and Role:    * Lisbeth Renshaw, MD - Primary    * Ostergard, Clovis Pu, MD - Assisting  PHYSICIAN ASSISTANT: None  ASSISTANTS: Dr. Autumn Patty, MD   ANESTHESIA:   general  EBL:  1500 mL   BLOOD ADMINISTERED:none  DRAINS: none   LOCAL MEDICATIONS USED:  BUPIVICAINE  and XYLOCAINE   SPECIMEN:  Source of Specimen:  cerebellar AVM for permanent path  DISPOSITION OF SPECIMEN:  PATHOLOGY  COUNTS:  YES  TOURNIQUET:  * No tourniquets in log *  DICTATION: .Dragon Dictation  PLAN OF CARE: Admit to inpatient   PATIENT DISPOSITION:  PACU - hemodynamically stable.   Delay start of Pharmacological VTE agent (>24hrs) due to surgical blood loss or risk of bleeding: yes   Lisbeth Renshaw, MD [facility]

## 2022-04-11 NOTE — Transfer of Care (Signed)
Immediate Anesthesia Transfer of Care Note  Patient: Christopher Yu  Procedure(s) Performed: STEREOTACTIC SUBOCCIPITAL CRANIOTOMY FOR RESECTION OF ARTERIO-VENOUS MALFORMATION APPLICATION OF CRANIAL NAVIGATION  Patient Location: PACU  Anesthesia Type:General  Level of Consciousness: awake and alert   Airway & Oxygen Therapy: Patient Spontanous Breathing and Patient connected to face mask oxygen  Post-op Assessment: Report given to RN, Post -op Vital signs reviewed and stable, Patient moving all extremities X 4, and Patient able to stick tongue midline  Post vital signs: Reviewed and stable  Last Vitals:  Vitals Value Taken Time  BP 112/56 04/11/22 1613  Temp    Pulse 60 04/11/22 1613  Resp 13 04/11/22 1613  SpO2 100 % 04/11/22 1613  Vitals shown include unvalidated device data.  Last Pain:  Vitals:   04/11/22 0620  TempSrc:   PainSc: 0-No pain         Complications: No notable events documented.

## 2022-04-11 NOTE — H&P (Signed)
Chief Complaint   AVM  History of Present Illness  [name] is a 61 year old man I am seeing for the above He is referred for evaluation of a recently discovered incidental likely arteriovenous malformation versus dural fistula. Briefly, the patient presented with what sounds like generalized fatigue. His workup revealed low testosterone level. He ultimately underwent MRI of the brain with pituitary protocol for possible central etiology. While there was no significant pituitary abnormality noted, he was found to harbor possible posterior fossa AVM versus dural fistula. Upon questioning, the patient does not report any new headaches, although he does report a chronic history of headaches for which she has been taking gabapentin. No new numbness tingling or weakness of the extremities. No gait difficulties or changes in bladder function. He underwent angiogram confirming the presence of a small superficial cerebellar AVM.  He has visual changes related to early onset of macular degeneration related to a medication he had been put on for rheumatoid arthritis. Similarly, he had a side effect to one of his rheumatoid medications causing a cardiomyopathy. He does not report any history of heart attack or stroke. No known lung, liver, or kidney disease. He is not currently on any blood thinners or antiplatelet agents. He quit smoking years ago.   Past Medical History   Past Medical History:  Diagnosis Date   Anxiety    Arthritis, rheumatoid (HCC)    dx 1988   CAD (coronary artery disease), native coronary artery    Mild LAD disease with calcification noted at cath 12//27/16    Cardiomyopathy [facility])    Chronic systolic heart failure (HCC) 05/01/2015   Depression    Deviated nasal septum 06/25/2011   Headache    Hyperlipidemia    Impingement syndrome of left shoulder 12/22/2017   Rheumatoid aortitis    Sleep apnea    does not use cpap - UNABLE TO TOLERATE MASK   Sleep apnea in adult     deviated septum repaired, most recent sleep study was negative   Status post total right knee replacement 06/01/2015    Past Surgical History   Past Surgical History:  Procedure Laterality Date   ANKLE FUSION Right 2014   related to his arthritis   ANKLE FUSION Left 05/25/13   related to his arthritis   CARDIAC CATHETERIZATION N/A 05/01/2015   Procedure: Left Heart Cath and Coronary Angiography;  Surgeon: Lyn Records, MD;  Location: [facility] INVASIVE CV LAB;  Service: Cardiovascular;  Laterality: N/A;   FRACTURE SURGERY     left femur fracture x 3   IR ANGIO EXTERNAL CAROTID SEL EXT CAROTID BILAT MOD SED  01/28/2022   IR ANGIO INTRA EXTRACRAN SEL INTERNAL CAROTID BILAT MOD SED  01/28/2022   IR ANGIO VERTEBRAL SEL VERTEBRAL BILAT MOD SED  01/28/2022   KNEE ARTHROSCOPY     right x 4   NASAL SEPTOPLASTY W/ TURBINOPLASTY  06/25/2011   Procedure: NASAL SEPTOPLASTY WITH TURBINATE REDUCTION;  Surgeon: Osborn Coho, MD;  Location[facility] OR;  Service: ENT;  Laterality: Bilateral;   TONSILLECTOMY     TOTAL KNEE ARTHROPLASTY Right 06/01/2015   Procedure: RIGHT TOTAL KNEE ARTHROPLASTY;  Surgeon: Kathryne Hitch, MD;  Location: [facility] ORS;  Service: Orthopedics;  Laterality: Right;  Block+general    Social History   Social History   Tobacco Use   Smoking status: Former    Packs/day: 0.50    Years: 20.00    Total pack years: 10.00    Types: Cigarettes  Quit date: 05/24/2008    Years since quitting: 13.8   Smokeless tobacco: Never   Tobacco comments:    stopped smoking 8 yrs ago.  Vaping Use   Vaping Use: Never used  Substance Use Topics   Alcohol use: Yes    Comment: rarely   Drug use: No    Medications   Prior to Admission medications   Medication Sig Start Date End Date Taking? Authorizing Provider  aspirin 81 MG tablet Take 81 mg by mouth daily.   Yes [provider]  CALCIUM PO Take 1 tablet by mouth daily.   Yes [provider]  Cholecalciferol (VITAMIN D3)  50 MCG (2000 UT) TABS Take 2,000 Units by mouth daily.   Yes [provider]  gabapentin (NEURONTIN) 300 MG capsule Take 300 mg by mouth 2 (two) times daily. 11/07/21  Yes [provider]  leflunomide (ARAVA) 10 MG tablet Take 10 mg by mouth daily.   Yes [provider]  methotrexate (RHEUMATREX) 2.5 MG tablet Take 20 mg by mouth once a week. Takes 8 tablets(20mg ) every Saturday.     Caution:Chemotherapy. Protect from light.   Yes [provider]  metoprolol succinate (TOPROL-XL) 25 MG 24 hr tablet TAKE 1 TABLET DAILY 04/03/22  Yes Corky Crafts, MD  Multiple Vitamins-Minerals (PRESERVISION AREDS PO) Take 2 capsules by mouth daily.   Yes [provider]  Propylene Glycol (SYSTANE COMPLETE) 0.6 % SOLN Place 1 drop into both eyes 2 (two) times daily.   Yes [provider]  rosuvastatin (CRESTOR) 20 MG tablet Take 1 tablet (20 mg total) by mouth daily. 02/24/22  Yes Corky Crafts, MD  sertraline (ZOLOFT) 50 MG tablet Take 50 mg by mouth every morning.   Yes [provider]  sulfaSALAzine (AZULFIDINE) 500 MG tablet Take 1,000 mg by mouth 2 (two) times daily.    Yes [provider]  TESTOSTERONE IM Inject into the muscle See admin instructions. Every three weeks   Yes [provider]  traMADol (ULTRAM) 50 MG tablet Take 50 mg by mouth daily as needed for moderate pain or severe pain. 07/01/18  Yes [provider]  lisinopril (ZESTRIL) 10 MG tablet TAKE 1 TABLET DAILY 04/03/22   Corky Crafts, MD  predniSONE (DELTASONE) 5 MG tablet Take 5 mg by mouth daily as needed (Arthritis). 08/08/19   [provider]  RITUXAN 500 MG/50ML injection See admin instructions. Every 6 months 2 dose infusion Ruxience 06/14/19   [provider]  spironolactone (ALDACTONE) 25 MG tablet TAKE 1 TABLET DAILY 04/03/22   Corky Crafts, MD    Allergies  No Known Allergies  Review of Systems   ROS  Neurologic Exam  Awake, alert, oriented Memory and concentration grossly intact Speech fluent, appropriate CN grossly intact Motor exam: Upper Extremities Deltoid Bicep Tricep Grip  Right 5/5 5/5 5/5 5/5  Left 5/5 5/5 5/5 5/5   Lower Extremities IP Quad PF DF EHL  Right 5/5 5/5 5/5 5/5 5/5  Left 5/5 5/5 5/5 5/5 5/5   Sensation grossly intact to LT  Imaging  S-M grade 1 left cerebellar AVM  Impression  - 61 y.o. male with largely incidental low-grade cerebellar AVM  Plan  - Will proceed with suboccipital crani for resection of AVM  I have reviewed the indications for the procedure as well as the details of the procedure and the expected postoperative course and recovery at length with the patient and his wife in the  office. We have also reviewed in detail the risks, benefits, and alternatives to the procedure. All questions were answered and [facility] provided informed consent to proceed.  Lisbeth Renshaw, MD [facility]

## 2022-04-11 NOTE — Anesthesia Postprocedure Evaluation (Signed)
Anesthesia Post Note  Patient: Marchello Rothgeb  Procedure(s) Performed: STEREOTACTIC SUBOCCIPITAL CRANIOTOMY FOR RESECTION OF ARTERIO-VENOUS MALFORMATION APPLICATION OF CRANIAL NAVIGATION     Patient location during evaluation: PACU Anesthesia Type: General Level of consciousness: awake and alert Pain management: pain level controlled Vital Signs Assessment: post-procedure vital signs reviewed and stable Respiratory status: spontaneous breathing, nonlabored ventilation, respiratory function stable and patient connected to nasal cannula oxygen Cardiovascular status: blood pressure returned to baseline and stable Postop Assessment: no apparent nausea or vomiting Anesthetic complications: no   No notable events documented.  Last Vitals:  Vitals:   04/11/22 1700 04/11/22 1721  BP: 130/89 (!) 162/73  Pulse: 71 (!) 45  Resp: 15 13  Temp: 36.4 C 36.4 C  SpO2: 97% 99%    Last Pain:  Vitals:   04/11/22 1721  TempSrc: Oral  PainSc:                  Nelle Don Arma Reining

## 2022-04-11 NOTE — Anesthesia Procedure Notes (Signed)
Arterial Line Insertion Start/End12/12/2021 7:15 AM, 04/11/2022 7:25 AM Performed by: Alvera Novel, CRNA, CRNA  Patient location: Pre-op. Preanesthetic checklist: patient identified, IV checked, site marked, risks and benefits discussed, surgical consent, monitors and equipment checked, pre-op evaluation, timeout performed and anesthesia consent Lidocaine 1% used for infiltration Right, radial was placed Catheter size: 20 G Hand hygiene performed  and maximum sterile barriers used   Attempts: 1 Procedure performed without using ultrasound guided technique. Following insertion, dressing applied and Biopatch. Post procedure assessment: normal and unchanged  Patient tolerated the procedure well with no immediate complications.

## 2022-04-12 LAB — POCT I-STAT 7, (LYTES, BLD GAS, ICA,H+H)
Acid-base deficit: 4 mmol/L — ABNORMAL HIGH (ref 0.0–2.0)
Acid-base deficit: 6 mmol/L — ABNORMAL HIGH (ref 0.0–2.0)
Acid-base deficit: 6 mmol/L — ABNORMAL HIGH (ref 0.0–2.0)
Bicarbonate: 21.9 mmol/L (ref 20.0–28.0)
Bicarbonate: 20 mmol/L (ref 20.0–28.0)
Bicarbonate: 19.7 mmol/L — ABNORMAL LOW (ref 20.0–28.0)
Calcium, Ion: 1.2 mmol/L (ref 1.15–1.40)
Calcium, Ion: 1.18 mmol/L (ref 1.15–1.40)
Calcium, Ion: 1.14 mmol/L — ABNORMAL LOW (ref 1.15–1.40)
HCT: 34 % — ABNORMAL LOW (ref 39.0–52.0)
HCT: 33 % — ABNORMAL LOW (ref 39.0–52.0)
HCT: 31 % — ABNORMAL LOW (ref 39.0–52.0)
Hemoglobin: 11.6 g/dL — ABNORMAL LOW (ref 13.0–17.0)
Hemoglobin: 11.2 g/dL — ABNORMAL LOW (ref 13.0–17.0)
Hemoglobin: 10.5 g/dL — ABNORMAL LOW (ref 13.0–17.0)
O2 Saturation: 100 %
O2 Saturation: 100 %
O2 Saturation: 100 %
Patient temperature: 35.4
Potassium: 4.6 mmol/L (ref 3.5–5.1)
Potassium: 5 mmol/L (ref 3.5–5.1)
Potassium: 4.8 mmol/L (ref 3.5–5.1)
Sodium: 142 mmol/L (ref 135–145)
Sodium: 142 mmol/L (ref 135–145)
Sodium: 140 mmol/L (ref 135–145)
TCO2: 23 mmol/L (ref 22–32)
TCO2: 21 mmol/L — ABNORMAL LOW (ref 22–32)
TCO2: 21 mmol/L — ABNORMAL LOW (ref 22–32)
pCO2 arterial: 44.6 mmHg (ref 32–48)
pCO2 arterial: 38.9 mmHg (ref 32–48)
pCO2 arterial: 37.9 mmHg (ref 32–48)
pH, Arterial: 7.299 — ABNORMAL LOW (ref 7.35–7.45)
pH, Arterial: 7.318 — ABNORMAL LOW (ref 7.35–7.45)
pH, Arterial: 7.316 — ABNORMAL LOW (ref 7.35–7.45)
pO2, Arterial: 212 mmHg — ABNORMAL HIGH (ref 83–108)
pO2, Arterial: 259 mmHg — ABNORMAL HIGH (ref 83–108)
pO2, Arterial: 241 mmHg — ABNORMAL HIGH (ref 83–108)

## 2022-04-12 MED ORDER — DIAZEPAM 5 MG PO TABS
5.0000 mg | ORAL_TABLET | Freq: Four times a day (QID) | ORAL | Status: DC | PRN
  Administered 2022-04-12 – 2022-04-13 (×3): 5 mg via ORAL
  Filled 2022-04-12 (×3): qty 1

## 2022-04-12 NOTE — Progress Notes (Signed)
  NEUROSURGERY PROGRESS NOTE   Pt seen and examined. No issues overnight. Pt does c/o HA and neck pain. No new N/T/W.  EXAM: Temp:  [97.6 F (36.4 C)-98.8 F (37.1 C)] 98.4 F (36.9 C) (12/09 0400) Pulse Rate:  [45-109] 81 (12/09 0700) Resp:  [11-26] 13 (12/09 0700) BP: (98-162)/(58-89) 98/71 (12/09 0700) SpO2:  [90 %-99 %] 93 % (12/09 0700) Arterial Line BP: (70-162)/(56-88) 70/56 (12/08 2200) Intake/Output      12/08 0701 12/09 0700 12/09 0701 12/10 0700   I.V. (mL/kg) 3659.2 (34.3)    IV Piggyback 1049.7    Total Intake(mL/kg) 4708.9 (44.2)    Urine (mL/kg/hr) 2650 (1)    Blood 1500    Total Output 4150    Net +558.9          Awake, alert, oriented Speech fluent, appropriate CN grossly intact Good strength Wound dry  LABS: Lab Results  Component Value Date   CREATININE 1.01 01/28/2022   BUN 11 01/28/2022   NA 140 04/11/2022   K 4.8 04/11/2022   CL 106 01/28/2022   CO2 29 01/28/2022   Lab Results  Component Value Date   WBC 8.3 04/03/2022   HGB 10.5 (L) 04/11/2022   HCT 31.0 (L) 04/11/2022   MCV 91.3 04/03/2022   PLT 206 04/03/2022    IMPRESSION: - 61 y.o. male POD#1 suboccipital crani for AVM resection, doing well  PLAN: - Mobilize with PT/OT today - d/c Foley - Maintain SBP<173mmHg - Add valium for neck spasm - Cont to monitor in ICU.  - Likely plan on post-resection angiogram early next week   Lisbeth Renshaw, MD [facility]

## 2022-04-13 MED ORDER — HYDROMORPHONE HCL 1 MG/ML IJ SOLN
0.5000 mg | INTRAMUSCULAR | Status: DC | PRN
  Administered 2022-04-13 – 2022-04-16 (×10): 0.5 mg via INTRAVENOUS
  Filled 2022-04-13: qty 1
  Filled 2022-04-13: qty 0.5
  Filled 2022-04-13: qty 1
  Filled 2022-04-13 (×2): qty 0.5
  Filled 2022-04-13: qty 1
  Filled 2022-04-13 (×2): qty 0.5
  Filled 2022-04-13: qty 1
  Filled 2022-04-13 (×2): qty 0.5

## 2022-04-13 MED ORDER — DIAZEPAM 5 MG PO TABS
10.0000 mg | ORAL_TABLET | Freq: Four times a day (QID) | ORAL | Status: DC | PRN
  Administered 2022-04-14 – 2022-04-17 (×8): 10 mg via ORAL
  Filled 2022-04-13 (×8): qty 2

## 2022-04-13 NOTE — Evaluation (Signed)
Physical Therapy Evaluation Patient Details Name: Christopher Yu MRN: [id number] DOB: 1961/03/20 Today's Date: 04/13/2022  History of Present Illness  Pt is a 61yo male who under went a suboccipital craniotomy for resection of cerebellar AVM. PMH: RA, anxiety, CAD PSH: L TKA   Clinical Impression  Pt  admitted with above. Pt with severe headache and onset of neck spasms when in upright position limiting EOB/OOB tolerance and ambulation. Suspect once pain under control pt will progress well however at this time pt requiring min/modAx2 for standing at beside and marching in place. Pt unable to ambulate at this time. Pt also maintains eyes closed t/o session due to light sensitivity (worsening of headache). Pt with good home set up and support. Hopeful for good mobility progression to ultimately d/c home with spouse once medically stable. Acute PT to cont to follow.     Recommendations for follow up therapy are one component of a multi-disciplinary discharge planning process, led by the attending physician.  Recommendations may be updated based on patient status, additional functional criteria and insurance authorization.  Follow Up Recommendations Home health PT (pt may progress enough to not need it)      Assistance Recommended at Discharge Frequent or constant Supervision/Assistance  Patient can return home with the following       Equipment Recommendations Rolling walker (2 wheels) (may not need)  Recommendations for Other Services       Functional Status Assessment Patient has had a recent decline in their functional status and demonstrates the ability to make significant improvements in function in a reasonable and predictable amount of time.     Precautions / Restrictions Precautions Precautions: Fall Precaution Comments: intense neck pain Restrictions Weight Bearing Restrictions: No      Mobility  Bed Mobility Overal bed mobility: Needs Assistance Bed  Mobility: Rolling, Sidelying to Sit, Sit to Sidelying Rolling: Min guard Sidelying to sit: Min assist     Sit to sidelying: Min assist General bed mobility comments: educated on log rolling technique, minA for trunk elevation to EOB, max directional verbal cues    Transfers Overall transfer level: Needs assistance Equipment used: 1 person hand held assist Transfers: Sit to/from Stand Sit to Stand: Min assist, From elevated surface           General transfer comment: verbal cues for safe hand placement, pt L HHA for sit to stand but then required bilat HHA to maintain static standing balance and then marching in place, pt kept eyes closed t/o due to light sensitivity and worsening of headache    Ambulation/Gait               General Gait Details: limited to side stepping to [facility] and marching x 8 reps due to onset of 9/10 pain in neck via spasms and worsening headache requiring pt to return to supine. again pt kept eyes closed, pt shaky with impaired balance during marching with noted posterior bias  Stairs            Wheelchair Mobility    Modified Rankin (Stroke Patients Only) Modified Rankin (Stroke Patients Only) Pre-Morbid Rankin Score: No significant disability Modified Rankin: Moderately severe disability     Balance Overall balance assessment: Needs assistance Sitting-balance support: Feet supported, Bilateral upper extremity supported Sitting balance-Leahy Scale: Fair Sitting balance - Comments: uses hands to brace self   Standing balance support: Bilateral upper extremity supported, During functional activity Standing balance-Leahy Scale: Poor Standing balance comment: pt required bilat HHA  Pertinent Vitals/Pain Pain Assessment Pain Assessment: 0-10 Pain Score: 9  (1/10 headache in supine, 9/10 once in  sitting with onset of spasms) Pain Location: headache with onset of muscle spasms in neck Pain  Descriptors / Indicators: Spasm, Sharp, Shooting Pain Intervention(s): Patient requesting pain meds-RN notified (returned to supine)    Home Living Family/patient expects to be discharged to:: Private residence Living Arrangements: Spouse/significant other Available Help at Discharge: Family Type of Home: House Home Access: Stairs to enter Entrance Stairs-Rails: Right Entrance Stairs-Number of Steps: 3 Alternate Level Stairs-Number of Steps: flight Home Layout: Two level;Able to live on main level with bedroom/bathroom        Prior Function Prior Level of Function : Independent/Modified Independent                     Hand Dominance   Dominant Hand: Right    Extremity/Trunk Assessment   Upper Extremity Assessment Upper Extremity Assessment: Overall WFL for tasks assessed    Lower Extremity Assessment Lower Extremity Assessment: Overall WFL for tasks assessed (suspect possibel coordination deficits when able to ambulate due to cerebellar involvement)    Cervical / Trunk Assessment Cervical / Trunk Assessment: Neck Surgery  Communication   Communication: No difficulties  Cognition Arousal/Alertness: Awake/alert Behavior During Therapy: WFL for tasks assessed/performed Overall Cognitive Status: Within Functional Limits for tasks assessed                                          General Comments General comments (skin integrity, edema, etc.): pt kept eyes closed t/o session. HR in 80s upon PT arrival and increased to 108bpm during standing, BP stable    Exercises     Assessment/Plan    PT Assessment Patient needs continued PT services  PT Problem List Decreased strength;Decreased activity tolerance;Decreased balance;Decreased mobility;Decreased coordination;Decreased knowledge of use of DME;Decreased cognition       PT Treatment Interventions DME instruction;Gait training;Stair training;Functional mobility training;Therapeutic  activities;Balance training;Therapeutic exercise;Cognitive remediation    PT Goals (Current goals can be found in the Care Plan section)  Acute Rehab PT Goals Patient Stated Goal: stop the pain PT Goal Formulation: With patient/family Time For Goal Achievement: 04/27/22 Potential to Achieve Goals: Good    Frequency Min 4X/week     Co-evaluation               AM-PAC PT "6 Clicks" Mobility  Outcome Measure Help needed turning from your back to your side while in a flat bed without using bedrails?: A Little Help needed moving from lying on your back to sitting on the side of a flat bed without using bedrails?: A Little Help needed moving to and from a bed to a chair (including a wheelchair)?: A Little Help needed standing up from a chair using your arms (e.g., wheelchair or bedside chair)?: A Little Help needed to walk in hospital room?: A Lot Help needed climbing 3-5 steps with a railing? : A Lot 6 Click Score: 16    End of Session Equipment Utilized During Treatment: Gait belt Activity Tolerance: Patient limited by pain Patient left: in bed;with call bell/phone within reach;with family/visitor present Nurse Communication: Mobility status;Patient requests pain meds PT Visit Diagnosis: Unsteadiness on feet (R26.81);Difficulty in walking, not elsewhere classified (R26.2)    Time: 1025-8527 PT Time Calculation (min) (ACUTE ONLY): 17 min   Charges:   PT Evaluation $  PT Eval Moderate Complexity: 1 Mod          Lewis Shock, PT, DPT Acute Rehabilitation Services Secure chat preferred Office #: [phone]   Iona Hansen 04/13/2022, 10:23 AM

## 2022-04-13 NOTE — Progress Notes (Signed)
Neurosurgery Service Progress Note  Subjective: No acute events overnight, still having incisional / neck pain, worse when sitting up and improved with laying flat   Objective: Vitals:   04/13/22 0700 04/13/22 0800 04/13/22 0900 04/13/22 1000  BP: (!) 107/52 120/64 115/68 134/73  Pulse: 62 81 77 93  Resp: 20 20 (!) 21 20  Temp:  99.5 F (37.5 C)    TempSrc:  Oral    SpO2: 94% 94% 96% 98%  Weight:      Height:        Physical Exam: Aox3, PERRL, EOMI, FS, strength 5/5x4, incision c/d/i  Assessment & Plan: 61 y.o. man s/p SOC for AVM rsxn, recovering well.  -will change morphine to hydromorphone to try and get better control w/ less nausea, increase diazepam to 10mg  -angio this week    04/13/22 10:44 AM

## 2022-04-13 NOTE — Progress Notes (Signed)
   04/13/22 1700  Provider Notification  Provider Name/Title Dr. Conchita Paris  Date Provider Notified 04/13/22  Time Provider Notified 1715  Method of Notification Call  Notification Reason Other (Comment) (Patient experiencing new double vision)  Provider response No new orders  Date of Provider Response 04/13/22  Time of Provider Response 1715

## 2022-04-13 NOTE — Progress Notes (Signed)
OT Cancellation Note  Patient Details Name: Christopher Yu MRN: [id number] DOB: 1960-05-18   Cancelled Treatment:    Reason Eval/Treat Not Completed: Pain limiting ability to participate (Pt with decreased tolerance of activity due to severe pain. MD adjusting medications. Will follow.)  Evern Bio 04/13/2022, 1:02 PM Berna Spare, OTR/L Acute Rehabilitation Services Office: [phone]

## 2022-04-14 LAB — SURGICAL PATHOLOGY

## 2022-04-14 LAB — BASIC METABOLIC PANEL
Anion gap: 7 (ref 5–15)
BUN: 11 mg/dL (ref 6–20)
CO2: 28 mmol/L (ref 22–32)
Calcium: 8.6 mg/dL — ABNORMAL LOW (ref 8.9–10.3)
Chloride: 105 mmol/L (ref 98–111)
Creatinine, Ser: 0.96 mg/dL (ref 0.61–1.24)
GFR, Estimated: >60 mL/min (ref >60)
Glucose, Bld: 134 mg/dL — ABNORMAL HIGH (ref 70–99)
Potassium: 3.9 mmol/L (ref 3.5–5.1)
Sodium: 140 mmol/L (ref 135–145)

## 2022-04-14 MED ORDER — PROSIGHT PO TABS
1.0000 | ORAL_TABLET | Freq: Every day | ORAL | Status: DC
  Administered 2022-04-15 – 2022-04-17 (×3): 1 via ORAL
  Filled 2022-04-14 (×3): qty 1

## 2022-04-14 MED ORDER — PANTOPRAZOLE SODIUM 40 MG PO TBEC
40.0000 mg | DELAYED_RELEASE_TABLET | Freq: Every day | ORAL | Status: DC
  Administered 2022-04-14 – 2022-04-16 (×3): 40 mg via ORAL
  Filled 2022-04-14 (×3): qty 1

## 2022-04-14 MED FILL — Thrombin For Soln 5000 Unit: CUTANEOUS | Qty: 5000 | Status: AC

## 2022-04-14 NOTE — Progress Notes (Signed)
OT NOTE ( late entry)  Patient is s/p suboccipital craniotomy surgery resulting in functional limitations due to the deficits listed below (see OT problem list). Pt without diplopia at present with testing but does demonstrate decreased convergence with R eye. Pt demonstrates decreased hand to nose with testing and needs more depth perception task next session along with higher balance challenged.  Patient will benefit from skilled OT acutely to increase independence and safety with ADLS to allow discharge HHOT.    04/14/22 1355  OT Visit Information  Last OT Received On 04/14/22  Assistance Needed +1  History of Present Illness Pt is a 61yo male who under went a suboccipital craniotomy for resection of cerebellar AVM. PMH: RA, anxiety, CAD PSH: L TKA  Precautions  Precautions Fall  Restrictions  Weight Bearing Restrictions No  Home Living  Family/patient expects to be discharged to: Private residence  Living Arrangements Spouse/significant other  Available Help at Discharge Family  Type of Home House  Home Access Stairs to enter  Entrance Stairs-Number of Steps 3  Entrance Stairs-Rails Right  Home Layout Two level;Able to live on main level with bedroom/bathroom  Alternate Level Stairs-Number of Steps flight  Bathroom Shower/Tub Walk-in shower  Bathroom Toilet Handicapped height  Bathroom Accessibility Yes  Home Research scientist (physical sciences) (2 wheels)  Prior Function  Prior Level of Function  Independent/Modified Independent  Communication  Communication No difficulties  Pain Assessment  Pain Assessment No/denies pain  Cognition  Arousal/Alertness Awake/alert  Behavior During Therapy WFL for tasks assessed/performed  Overall Cognitive Status Within Functional Limits for tasks assessed  Upper Extremity Assessment  Upper Extremity Assessment Generalized weakness;RUE deficits/detail;LUE deficits/detail  RUE Deficits / Details decreased coordination. pt with need for visual attention  and increased time to open bottle. Pt with decreased proprioception with hand to nose task  RUE Coordination decreased fine motor  LUE Deficits / Details decreased coordination. pt with need for visual attention and increased time to open bottle. Pt with decreased proprioception with hand to nose task  LUE Coordination decreased fine motor  Lower Extremity Assessment  Lower Extremity Assessment Defer to PT evaluation  Cervical / Trunk Assessment  Cervical / Trunk Assessment Neck Surgery  Vision- History  Baseline Vision/History 1 Wears glasses  Patient Visual Report No change from baseline  Vision- Assessment  Vision Assessment? Yes  Eye Alignment [facility]  Ocular Range of Motion [facility]  Tracking/Visual Pursuits Able to track stimulus in all quads without difficulty  Saccades WFL  Convergence Impaired (comment)  Additional Comments pt denies diplopia with all testing during session. pt is noted to have decreased convergence with R eye .  ADL  Overall ADL's  Needs assistance/impaired  Eating/Feeding Set up  Grooming Applying deodorant  Upper Body Bathing Set up  Lower Body Dressing Minimal assistance  Toilet Transfer Minimal assistance  Bed Mobility  General bed mobility comments oob on arrival in chair  Transfers  Overall transfer level Needs assistance  Transfers Sit to/from Stand  Sit to Stand Min guard  General transfer comment pt requires hands for sit<>stand . pt initiates scooting indep  Balance  Overall balance assessment Needs assistance  Sitting-balance support Bilateral upper extremity supported;Feet supported  Sitting balance-Leahy Scale Fair  Standing balance support Bilateral upper extremity supported;During functional activity  Standing balance-Leahy Scale Fair  High Level Balance Comments pt static standing with eye occlusion without LOB.  OT - End of Session  Activity Tolerance Patient tolerated treatment well  Patient left in chair;with chair alarm  set  Nurse  Communication Mobility status;Precautions  OT Assessment  OT Recommendation/Assessment Patient needs continued OT Services  OT Visit Diagnosis Unsteadiness on feet (R26.81)  OT Problem List Decreased activity tolerance;Impaired balance (sitting and/or standing);Impaired vision/perception;Decreased coordination;Decreased knowledge of use of DME or AE;Decreased knowledge of precautions  OT Plan  OT Frequency (ACUTE ONLY) Min 2X/week  OT Treatment/Interventions (ACUTE ONLY) Self-care/ADL training;Therapeutic exercise;Neuromuscular education;Energy conservation;DME and/or AE instruction;Manual therapy;Modalities;Therapeutic activities;Visual/perceptual remediation/compensation;Patient/family education;Balance training  AM-PAC OT "6 Clicks" Daily Activity Outcome Measure (Version 2)  Help from another person eating meals? 3  Help from another person taking care of personal grooming? 3  Help from another person toileting, which includes using toliet, bedpan, or urinal? 3  Help from another person bathing (including washing, rinsing, drying)? 3  Help from another person to put on and taking off regular upper body clothing? 3  Help from another person to put on and taking off regular lower body clothing? 3  6 Click Score 18  Progressive Mobility  What is the highest level of mobility based on the progressive mobility assessment? Level 5 (Walks with assist in room/hall) - Balance while stepping forward/back and can walk in room with assist - Complete  Mobility Referral Yes  Activity Repositioned in chair  OT Recommendation  Follow Up Recommendations Home health OT  Assistance recommended at discharge Set up Supervision/Assistance  Patient can return home with the following A little help with walking and/or transfers;A little help with bathing/dressing/bathroom;Assist for transportation  Functional Status Assessent Patient has had a recent decline in their functional status and demonstrates the ability  to make significant improvements in function in a reasonable and predictable amount of time.  OT Equipment BSC/3in1  Individuals Consulted  Consulted and Agree with Results and Recommendations Patient  Acute Rehab OT Goals  Patient Stated Goal none stated  OT Goal Formulation With patient  Time For Goal Achievement 04/28/22  Potential to Achieve Goals Good  OT Time Calculation  OT Start Time (ACUTE ONLY) 1222  OT Stop Time (ACUTE ONLY) 1239  OT Time Calculation (min) 17 min  OT General Charges  $OT Visit 1 Visit  OT Evaluation  $OT Eval Moderate Complexity 1 Mod  Written Expression  Dominant Hand Right   Brynn, OTR/L  Acute Rehabilitation Services Office: [phone] .

## 2022-04-14 NOTE — Progress Notes (Signed)
  NEUROSURGERY PROGRESS NOTE   Pt seen and examined. No issues overnight. Blurry vision yesterday has resolved. Cont to c/o primarily neck spasms when upright.  EXAM: Temp:  [98.3 F (36.8 C)-99.7 F (37.6 C)] 98.5 F (36.9 C) (12/11 0800) Pulse Rate:  [54-93] 73 (12/11 0900) Resp:  [12-24] 12 (12/11 0900) BP: (98-134)/(37-97) 122/71 (12/11 0900) SpO2:  [94 %-100 %] 94 % (12/11 0900) Intake/Output      12/10 0701 12/11 0700 12/11 0701 12/12 0700   P.O. 40    I.V. (mL/kg) 1863.8 (17.5) 149.9 (1.4)   Total Intake(mL/kg) 1903.8 (17.9) 149.9 (1.4)   Urine (mL/kg/hr) 2800 (1.1)    Total Output 2800    Net -896.3 +149.9         Awake, alert, oriented Speech fluent CN intact MAE good strength Incision c/d/I, no leak  LABS: Lab Results  Component Value Date   CREATININE 1.01 01/28/2022   BUN 11 01/28/2022   NA 140 04/11/2022   K 4.8 04/11/2022   CL 106 01/28/2022   CO2 29 01/28/2022   Lab Results  Component Value Date   WBC 8.3 04/03/2022   HGB 10.5 (L) 04/11/2022   HCT 31.0 (L) 04/11/2022   MCV 91.3 04/03/2022   PLT 206 04/03/2022    IMPRESSION: - 61 y.o. male POD# 3 s/p pos fossa crani for AVM resection, doing well.  PLAN: - Cont to mobilize with PT/OT - Cont Valium and dilaudid PRN - Can transfer to stepdown today - Plan on angiogram prior to d/c home   Lisbeth Renshaw, MD [facility]

## 2022-04-14 NOTE — Progress Notes (Signed)
Physical Therapy Treatment Patient Details Name: Christopher Yu MRN: [id number] DOB: 10-10-1960 Today's Date: 04/14/2022   History of Present Illness Pt is a 61yo male who under went a suboccipital craniotomy for resection of cerebellar AVM. PMH: RA, anxiety, CAD PSH: L TKA    PT Comments    Pt much improved from yesterdays PT eval. Pt with report of different, stronger pain medicine. Pt at 4/10 with headache and denies neck spasms this date. Pt received sitting up in bed. Pt able to tolerate ambulation of 150' with RW and minA this date. Pt very guarded and cautious with limited active cervical rotation, flexion/extension. Pt progressing well towards all goals. Acute PT to cont to follow.    Recommendations for follow up therapy are one component of a multi-disciplinary discharge planning process, led by the attending physician.  Recommendations may be updated based on patient status, additional functional criteria and insurance authorization.  Follow Up Recommendations  Home health PT (progressing well and may not need it)     Assistance Recommended at Discharge Frequent or constant Supervision/Assistance  Patient can return home with the following A little help with walking and/or transfers;A little help with bathing/dressing/bathroom;Assist for transportation;Help with stairs or ramp for entrance   Equipment Recommendations  Rolling walker (2 wheels) (may not need)    Recommendations for Other Services       Precautions / Restrictions Precautions Precautions: Fall Restrictions Weight Bearing Restrictions: No     Mobility  Bed Mobility Overal bed mobility: Needs Assistance Bed Mobility: Supine to Sit     Supine to sit: Min guard     General bed mobility comments: HOB elevated, used bed rails, increased time, guarded, cautious    Transfers Overall transfer level: Needs assistance Equipment used: Rolling walker (2 wheels) Transfers: Sit to/from  Stand Sit to Stand: Min assist, From elevated surface           General transfer comment: verbal cues for safe hand placement,  pt improved with bed elevated as pt is 6'1    Ambulation/Gait Ambulation/Gait assistance: Min assist Gait Distance (Feet): 150 Feet Assistive device: Rolling walker (2 wheels) Gait Pattern/deviations: Step-through pattern, Decreased stride length Gait velocity: decresaed compared to baseline Gait velocity interpretation: <1.31 ft/sec, indicative of household ambulator   General Gait Details: very guarded, stiff neck with no cervical rotation, minA to maintain forward progression of RW for fluid gait pattern, pt denied worsening of headache, remained constant at 4/10   Stairs             Wheelchair Mobility    Modified Rankin (Stroke Patients Only) Modified Rankin (Stroke Patients Only) Pre-Morbid Rankin Score: No significant disability Modified Rankin: Moderate disability     Balance Overall balance assessment: Needs assistance Sitting-balance support: Feet supported, Bilateral upper extremity supported Sitting balance-Leahy Scale: Fair Sitting balance - Comments: uses hands to brace self   Standing balance support: Bilateral upper extremity supported, During functional activity Standing balance-Leahy Scale: Fair Standing balance comment: pt benefits from RW, pt reports feeling more confident                            Cognition Arousal/Alertness: Awake/alert Behavior During Therapy: WFL for tasks assessed/performed Overall Cognitive Status: Within Functional Limits for tasks assessed  General Comments: pt guarded and cautious due to fear of onset of increased pain        Exercises      General Comments General comments (skin integrity, edema, etc.): VSS, pt able to maintain eyes open t/o session this date.      Pertinent Vitals/Pain Pain Assessment Pain Assessment:  0-10 Pain Score: 4  Pain Location: headache Pain Descriptors / Indicators: Headache, Constant Pain Intervention(s): Premedicated before session, Monitored during session    Home Living                          Prior Function            PT Goals (current goals can now be found in the care plan section) Acute Rehab PT Goals PT Goal Formulation: With patient/family Time For Goal Achievement: 04/27/22 Potential to Achieve Goals: Good Progress towards PT goals: Progressing toward goals    Frequency    Min 4X/week      PT Plan Current plan remains appropriate    Co-evaluation              AM-PAC PT "6 Clicks" Mobility   Outcome Measure  Help needed turning from your back to your side while in a flat bed without using bedrails?: A Little Help needed moving from lying on your back to sitting on the side of a flat bed without using bedrails?: A Little Help needed moving to and from a bed to a chair (including a wheelchair)?: A Little Help needed standing up from a chair using your arms (e.g., wheelchair or bedside chair)?: A Little Help needed to walk in hospital room?: A Lot Help needed climbing 3-5 steps with a railing? : A Lot 6 Click Score: 16    End of Session Equipment Utilized During Treatment: Gait belt Activity Tolerance: Patient tolerated treatment well Patient left: with family/visitor present;in chair Nurse Communication: Mobility status PT Visit Diagnosis: Unsteadiness on feet (R26.81);Difficulty in walking, not elsewhere classified (R26.2)     Time: 3154-0086 PT Time Calculation (min) (ACUTE ONLY): 28 min  Charges:  $Gait Training: 8-22 mins $Therapeutic Activity: 8-22 mins                     Christopher Yu, PT, DPT Acute Rehabilitation Services Secure chat preferred Office #: [phone]    Christopher Yu 04/14/2022, 12:10 PM

## 2022-04-15 ENCOUNTER — Encounter (HOSPITAL_COMMUNITY): Payer: Self-pay | Admitting: Neurosurgery

## 2022-04-15 LAB — BPAM RBC
Blood Product Expiration Date: 202401082359
Blood Product Expiration Date: 202401092359
Blood Product Expiration Date: 202401092359
Blood Product Expiration Date: 202401092359
ISSUE DATE / TIME: 202312081308
ISSUE DATE / TIME: 202312081308
Unit Type and Rh: 5100
Unit Type and Rh: 5100
Unit Type and Rh: 5100
Unit Type and Rh: 5100

## 2022-04-15 LAB — TYPE AND SCREEN
ABO/RH(D): O POS
Antibody Screen: NEGATIVE
Unit division: 0
Unit division: 0
Unit division: 0
Unit division: 0

## 2022-04-15 NOTE — Progress Notes (Signed)
Physical Therapy Treatment Patient Details Name: Christopher Yu MRN: [id number] DOB: [birth year] Today's Date: 04/15/2022   History of Present Illness Pt is a 61yo male who under went a suboccipital craniotomy for resection of cerebellar AVM. PMH: RA, anxiety, CAD PSH: L TKA    PT Comments    Pt progressing towards all goals. Pt continues with L UE and LE ataxia but overall improved since initial eval. At this time pt continues to require use of RW for safe ambulation as pt with mild L LE ataxia, impaired co-ordination, and increased fall risk when amb without. Pt also with LOB when turning head during ambulation. Pt given VORx1 exercises, wife present with good understanding. Acute PT to cont to follow.    Recommendations for follow up therapy are one component of a multi-disciplinary discharge planning process, led by the attending physician.  Recommendations may be updated based on patient status, additional functional criteria and insurance authorization.  Follow Up Recommendations  Home health PT     Assistance Recommended at Discharge Frequent or constant Supervision/Assistance  Patient can return home with the following A little help with walking and/or transfers;A little help with bathing/dressing/bathroom;Assist for transportation;Help with stairs or ramp for entrance   Equipment Recommendations  Rolling walker (2 wheels)    Recommendations for Other Services       Precautions / Restrictions Precautions Precautions: Fall Restrictions Weight Bearing Restrictions: No     Mobility  Bed Mobility Overal bed mobility: Needs Assistance Bed Mobility: Supine to Sit     Supine to sit: Min guard     General bed mobility comments: increased time, HOB elevated    Transfers Overall transfer level: Needs assistance Equipment used: Rolling walker (2 wheels) Transfers: Sit to/from Stand Sit to Stand: Min guard           General transfer comment: pt able to  push up from bed today with min guard and steady selft during transition of hands to RW    Ambulation/Gait Ambulation/Gait assistance: Min assist Gait Distance (Feet): 200 Feet Assistive device: Rolling walker (2 wheels) Gait Pattern/deviations: Step-through pattern, Decreased stride length Gait velocity: decresaed compared to baseline Gait velocity interpretation: <1.31 ft/sec, indicative of household ambulator   General Gait Details: pt with short step height and length and noted "heaviess step" with L LE/less control on descent when stepping with L LE. Attempted to ambulate without RW, provided pt with L HHA, pt with increased instability and noted impaired L LE ataxia/impaired co-ordination. pt improved with contraction of core muscles however remains to require modA when amb without AD   Stairs             Wheelchair Mobility    Modified Rankin (Stroke Patients Only) Modified Rankin (Stroke Patients Only) Pre-Morbid Rankin Score: No significant disability Modified Rankin: Moderate disability     Balance Overall balance assessment: Needs assistance Sitting-balance support: Bilateral upper extremity supported, Feet supported Sitting balance-Leahy Scale: Fair Sitting balance - Comments: uses hands to brace self   Standing balance support: Bilateral upper extremity supported, During functional activity Standing balance-Leahy Scale: Fair Standing balance comment: pt benefits from RW, pt reports feeling more confident             High level balance activites: Side stepping High Level Balance Comments: worked on side stepping along EOB progressing to no UE support and increasing step length to emphasize balancing on single limb. Also worked on SLS bilaterally, pt requiring UE assist and unable to balance without  UE assist when in SLS            Cognition Arousal/Alertness: Awake/alert Behavior During Therapy: [facility] for tasks assessed/performed Overall Cognitive  Status: Within Functional Limits for tasks assessed                                 General Comments: pt guarded and cautious due to fear of onset of increased pain        Exercises Other Exercises Other Exercises: gentle AROM to cervical rotation and flex/ext Other Exercises: VORx1 exercises to aide in minimizing dizziness with head turns, wife present with good understanding of exercises    General Comments General comments (skin integrity, edema, etc.): pt assisted to the bathroom, spouse present and demos good assist      Pertinent Vitals/Pain Pain Assessment Pain Assessment: 0-10 Pain Score: 6  Pain Location: headache Pain Descriptors / Indicators: Headache, Constant    Home Living Family/patient expects to be discharged to:: Private residence Living Arrangements: Spouse/significant other Available Help at Discharge: Family Type of Home: House Home Access: Stairs to enter Entrance Stairs-Rails: Right Entrance Stairs-Number of Steps: 3 Alternate Level Stairs-Number of Steps: flight Home Layout: Two level;Able to live on main level with bedroom/bathroom Home Equipment: Rolling Walker (2 wheels)      Prior Function            PT Goals (current goals can now be found in the care plan section) Acute Rehab PT Goals Patient Stated Goal: stop the pain PT Goal Formulation: With patient/family Time For Goal Achievement: 04/27/22 Potential to Achieve Goals: Good Progress towards PT goals: Progressing toward goals    Frequency    Min 4X/week      PT Plan Current plan remains appropriate    Co-evaluation              AM-PAC PT "6 Clicks" Mobility   Outcome Measure  Help needed turning from your back to your side while in a flat bed without using bedrails?: A Little Help needed moving from lying on your back to sitting on the side of a flat bed without using bedrails?: A Little Help needed moving to and from a bed to a chair (including a  wheelchair)?: A Little Help needed standing up from a chair using your arms (e.g., wheelchair or bedside chair)?: A Little Help needed to walk in hospital room?: A Lot Help needed climbing 3-5 steps with a railing? : A Lot 6 Click Score: 16    End of Session Equipment Utilized During Treatment: Gait belt Activity Tolerance: Patient tolerated treatment well Patient left: with family/visitor present;in chair Nurse Communication: Mobility status PT Visit Diagnosis: Unsteadiness on feet (R26.81);Difficulty in walking, not elsewhere classified (R26.2)     Time: 0822-0910 PT Time Calculation (min) (ACUTE ONLY): 48 min  Charges:  $Gait Training: 23-37 mins $Neuromuscular Re-education: 8-22 mins                     Lewis Shock, PT, DPT Acute Rehabilitation Services Secure chat preferred Office #: [phone]    Iona Hansen 04/15/2022, 9:26 AM

## 2022-04-15 NOTE — Progress Notes (Signed)
  NEUROSURGERY PROGRESS NOTE   Pt seen and examined. No issues overnight. No further double vision. Neck pain/spasms improved today.  EXAM: Temp:  [97.6 F (36.4 C)-98.7 F (37.1 C)] 97.6 F (36.4 C) (12/12 1129) Pulse Rate:  [62-88] 85 (12/12 1129) Resp:  [14-18] 18 (12/12 0256) BP: (105-133)/(64-85) 105/68 (12/12 1129) SpO2:  [92 %-97 %] 92 % (12/12 1129) Intake/Output      12/11 0701 12/12 0700 12/12 0701 12/13 0700   P.O.     I.V. (mL/kg) 484.8 (4.5)    Total Intake(mL/kg) 484.8 (4.5)    Urine (mL/kg/hr) 2400 (0.9)    Total Output 2400    Net -1915.2         Urine Occurrence 1 x     Awake, alert, oriented Speech fluent CN intact MAE good strength Incision c/d/I, no leak  LABS: Lab Results  Component Value Date   CREATININE 0.96 04/14/2022   BUN 11 04/14/2022   NA 140 04/14/2022   K 3.9 04/14/2022   CL 105 04/14/2022   CO2 28 04/14/2022   Lab Results  Component Value Date   WBC 8.3 04/03/2022   HGB 10.5 (L) 04/11/2022   HCT 31.0 (L) 04/11/2022   MCV 91.3 04/03/2022   PLT 206 04/03/2022    IMPRESSION: - 61 y.o. male POD# 4 s/p pos fossa crani for AVM resection, doing well.  PLAN: - Cont to mobilize with PT/OT - Cont Valium and dilaudid PRN - Likely angiogram tomorrow, may d/c home tomorrow or Thursday   Lisbeth Renshaw, MD [facility]

## 2022-04-15 NOTE — Op Note (Signed)
NEUROSURGERY OPERATIVE NOTE   PREOP DIAGNOSIS:  Posterior Fossa Arteriovenous malformation   POSTOP DIAGNOSIS: Same  PROCEDURE: Suboccipital craniectomy for resection of arteriovenous malformation Use of intraoperative microscope for microdissection technique  SURGEON: Dr. Lisbeth Renshaw, MD  ASSISTANT: Dr. Ervin Knack, MD  ANESTHESIA: General Endotracheal  EBL: 1500cc  SPECIMENS: Arteriovenous malformation for permanent pathology  DRAINS: None  COMPLICATIONS: None immediate  CONDITION: Hemodynamically stable to the postanesthesia care unit  HISTORY: Christopher Yu is a 61 y.o. male initially seen in the outpatient neurosurgery clinic after essentially incidental discovery of arteriovenous malformation of the posterior fossa.  He underwent workup including diagnostic cerebral angiogram confirming the presence of the AVM.  After lengthy discussion of treatment options, surgical resection was recommended.  The risks, benefits, and alternatives to surgery were all reviewed in detail with the patient and his family.  After all their questions were answered informed consent was obtained and witnessed.  PROCEDURE IN DETAIL: The patient was brought to the operating room. After induction of general anesthesia, the patient was positioned on the operative table in the [place] head holder in the prone position. All pressure points were meticulously padded.  Preoperative MRI was then Co. registered with surface markers and a good accuracy was achieved.  Midline skin incision was then marked out and prepped and draped in the usual sterile fashion.  After timeout was conducted, the midline skin incision was filtrated with local anesthetic with epinephrine.  The incision was then made sharply and carried down through the subcutaneous tissue until the nuchal fascia was identified.  This was then incised in a Y shaped based superiorly.  The suboccipital musculature was then  divided in the avascular midline plane and elevated from the occipital bone and the C1 arch.  All retaining retractors were then placed.  The stereotactic system was then used to confirm enough lateral and superior exposure including the edge of the torcular and transverse sinuses and the edge of the foramen magnum.  This point, the high-speed drill was used to create multiple bur holes, taking care to preserve the underlying dura.  Bur holes were then connected by drilling out troughs and connecting with Kerrison punches.  The suboccipital craniectomy flap was then elevated.  Hemostasis on the epidural surface was secured with bipolar electrocautery.  Leksell rongeur's were used to widen the craniectomy laterally on the left side.  Stereotactic system was used to delineate the lateral extent of the craniectomy.  I also used the high-speed drill to further drill away the occipital bone in order to visualize the most inferior portion of the torcular and left transverse sinus.  The dura was then opened in Y-shaped fashion and reflected superiorly.  The microscope was then brought into the field and the remainder of the AVM resection was done under the microscope using microdissection technique.  Initially, the bipolar electrocautery was used to create a corticotomy over the left cerebellar hemisphere.  The bipolar was then used to dissect through cerebellar white matter around the inferior margin of the tumor.  Dissection was then carried more laterally.  I then attempted to identify the superior margin of the tumor by identifying the tentorium which became very difficult.  It became clear that the AVM was densely adherent to the undersurface of the tentorium.  I therefore slowly worked laterally until I was able to identify the tentorium laterally.  The bipolar was then used to coagulate the tentorial surface of the cerebellum.  In doing so, a large draining  vein was identified and partially avulsed from the  tentorium.  Bleeding was controlled with bipolar electrocautery, morcellized Gelfoam and a cottonoid pledget.  At this point, with ligation of the main draining vein, the arteriovenous malformation became quite friable.  We began working on the medial surface, and in a similar fashion, the medial portion of the arteriovenous malformation was dissected.  It was still difficult to mobilize the arteriovenous malformation inferiorly in order to identify its tentorial surface.  We therefore elected to partially truncate the arteriovenous malformation superiorly as close as possible to the tentorium.  The bipolar electrocautery was used to do this by slowly coagulating the more superior superficial portion of the arteriovenous minute malformation and ligating and moving more anteriorly.  Ultimately, we were able to disconnect the arteriovenous malformation from the tentorium.  The AVM was then sent for permanent pathology.  The tentorial surface was then inspected and no further AVM was seen.  The resection bed was also inspected and no arteriovenous malformation was seen.  There is no active bleeding.  At this point the wound was irrigated with normal saline irrigation.  Hemostasis was secured easily with bipolar electrocautery and morselized L form and thrombin.  The dura was then reapproximated with interrupted 4-0 Nurolon stitches.  A DuraGen onlay graft was placed and the dural surface was covered with a layer of polyethylene glycol sealant.  Retaining retractors were then removed and the wound was again irrigated.  The wound was then closed in multiple layers in standard fashion using a combination of interrupted 0 Vicryl stitches.  The skin was closed with interrupted 3-0 Vicryl subcuticular stitches and Dermabond.  The patient was then transferred to the stretcher and the [place] head holder was removed.  He was then extubated and taken to the postanesthesia care unit in stable hemodynamic condition.  At the  end of the case all sponge, needle, instrument, and cottonoid counts were correct.   Lisbeth Renshaw, MD [facility]

## 2022-04-16 ENCOUNTER — Inpatient Hospital Stay (HOSPITAL_COMMUNITY): Payer: BC Managed Care – PPO

## 2022-04-16 HISTORY — PX: IR ANGIO INTRA EXTRACRAN SEL INTERNAL CAROTID BILAT MOD SED: IMG5363

## 2022-04-16 HISTORY — PX: IR ANGIO VERTEBRAL SEL VERTEBRAL BILAT MOD SED: IMG5369

## 2022-04-16 MED ORDER — IOHEXOL 300 MG/ML  SOLN
100.0000 mL | Freq: Once | INTRAMUSCULAR | Status: AC | PRN
  Administered 2022-04-16: 30 mL via INTRA_ARTERIAL

## 2022-04-16 MED ORDER — HEPARIN SODIUM (PORCINE) 1000 UNIT/ML IJ SOLN
INTRAMUSCULAR | Status: AC | PRN
  Administered 2022-04-16: 2000 [IU] via INTRAVENOUS

## 2022-04-16 MED ORDER — MIDAZOLAM HCL 2 MG/2ML IJ SOLN
INTRAMUSCULAR | Status: AC
  Filled 2022-04-16: qty 2

## 2022-04-16 MED ORDER — HYDROMORPHONE HCL 1 MG/ML IJ SOLN
INTRAMUSCULAR | Status: AC | PRN
  Administered 2022-04-16: .5 mg via INTRAVENOUS

## 2022-04-16 MED ORDER — LIDOCAINE HCL 1 % IJ SOLN
INTRAMUSCULAR | Status: AC
  Filled 2022-04-16: qty 20

## 2022-04-16 MED ORDER — FENTANYL CITRATE (PF) 100 MCG/2ML IJ SOLN
INTRAMUSCULAR | Status: AC | PRN
  Administered 2022-04-16 (×2): 25 ug via INTRAVENOUS

## 2022-04-16 MED ORDER — HEPARIN SODIUM (PORCINE) 1000 UNIT/ML IJ SOLN
INTRAMUSCULAR | Status: AC
  Filled 2022-04-16: qty 10

## 2022-04-16 MED ORDER — HYDROMORPHONE HCL 1 MG/ML IJ SOLN
INTRAMUSCULAR | Status: AC
  Filled 2022-04-16: qty 1

## 2022-04-16 MED ORDER — MIDAZOLAM HCL 2 MG/2ML IJ SOLN
INTRAMUSCULAR | Status: AC | PRN
  Administered 2022-04-16: 1 mg via INTRAVENOUS

## 2022-04-16 MED ORDER — FENTANYL CITRATE (PF) 100 MCG/2ML IJ SOLN
INTRAMUSCULAR | Status: AC
  Filled 2022-04-16: qty 2

## 2022-04-16 NOTE — Progress Notes (Signed)
  NEUROSURGERY PROGRESS NOTE   Pt seen and examined. No issues overnight. Cont to report improvement in neck pain/spasm. Sitting in bedside chair this am.  EXAM: Temp:  [97.6 F (36.4 C)-98.4 F (36.9 C)] 97.8 F (36.6 C) (12/13 0812) Pulse Rate:  [67-87] 67 (12/13 0315) Resp:  [15-19] 17 (12/13 0812) BP: (92-105)/(63-72) 92/63 (12/13 0812) SpO2:  [92 %-97 %] 94 % (12/13 0812) Intake/Output      12/12 0701 12/13 0700 12/13 0701 12/14 0700   P.O. 240    I.V. (mL/kg)     Total Intake(mL/kg) 240 (2.3)    Urine (mL/kg/hr) 700 (0.3)    Total Output 700    Net -460          Awake, alert, oriented Speech fluent CN intact MAE good strength Incision c/d/I, no leak  LABS: Lab Results  Component Value Date   CREATININE 0.96 04/14/2022   BUN 11 04/14/2022   NA 140 04/14/2022   K 3.9 04/14/2022   CL 105 04/14/2022   CO2 28 04/14/2022   Lab Results  Component Value Date   WBC 8.3 04/03/2022   HGB 10.5 (L) 04/11/2022   HCT 31.0 (L) 04/11/2022   MCV 91.3 04/03/2022   PLT 206 04/03/2022    IMPRESSION: - 61 y.o. male POD# 5 s/p pos fossa crani for AVM resection, doing well.  PLAN: - Cont to mobilize with PT/OT - Plan on angio today, likely d/c home tomorrow   Lisbeth Renshaw, MD [facility]

## 2022-04-16 NOTE — Progress Notes (Signed)
Physical Therapy Treatment Patient Details Name: Christopher Yu MRN: [id number] DOB: 10/03/1960 Today's Date: 04/16/2022   History of Present Illness Pt is a 61yo male who under went a suboccipital craniotomy for resection of cerebellar AVM. PMH: RA, anxiety, CAD PSH: L TKA    PT Comments    Pt received in recliner. Session focused on stair training. Pt required min guard assist transfers, min assist amb 200' with RW, and min assist ascend/descend 1 flight of stairs with R rail. Pt returned to recliner at end of session. Ice pack provided for posterior headache.    Recommendations for follow up therapy are one component of a multi-disciplinary discharge planning process, led by the attending physician.  Recommendations may be updated based on patient status, additional functional criteria and insurance authorization.  Follow Up Recommendations  Home health PT     Assistance Recommended at Discharge    Patient can return home with the following A little help with walking and/or transfers;A little help with bathing/dressing/bathroom;Assist for transportation;Help with stairs or ramp for entrance   Equipment Recommendations  Rolling walker (2 wheels)    Recommendations for Other Services       Precautions / Restrictions Precautions Precautions: Fall     Mobility  Bed Mobility               General bed mobility comments: Pt in recliner.    Transfers Overall transfer level: Needs assistance Equipment used: Rolling walker (2 wheels) Transfers: Sit to/from Stand Sit to Stand: Min guard           General transfer comment: cues for hand placement    Ambulation/Gait Ambulation/Gait assistance: Min assist Gait Distance (Feet): 200 Feet Assistive device: Rolling walker (2 wheels) Gait Pattern/deviations: Step-through pattern, Decreased stride length Gait velocity: decreased Gait velocity interpretation: 1.31 - 2.62 ft/sec, indicative of limited  community ambulator   General Gait Details: steady gait with RW   Stairs Stairs: Yes Stairs assistance: Min assist Stair Management: One rail Right, Forwards, Sideways, Alternating pattern, Step to pattern Number of Stairs: 12 General stair comments: forward alternating pattern for ascent, sideways step to pattern for descent. Verbal cues for sequencing   Wheelchair Mobility    Modified Rankin (Stroke Patients Only) Modified Rankin (Stroke Patients Only) Pre-Morbid Rankin Score: No significant disability Modified Rankin: Moderate disability     Balance Overall balance assessment: Needs assistance Sitting-balance support: Feet supported, Single extremity supported Sitting balance-Leahy Scale: Fair     Standing balance support: Bilateral upper extremity supported, During functional activity, No upper extremity supported Standing balance-Leahy Scale: Fair Standing balance comment: static stand without support, RW for amb                            Cognition Arousal/Alertness: Awake/alert Behavior During Therapy: WFL for tasks assessed/performed Overall Cognitive Status: Within Functional Limits for tasks assessed                                          Exercises      General Comments General comments (skin integrity, edema, etc.): sneakers donned for session      Pertinent Vitals/Pain Pain Assessment Pain Assessment: Faces Faces Pain Scale: Hurts even more Pain Location: headache Pain Descriptors / Indicators: Headache, Constant Pain Intervention(s): Monitored during session, Limited activity within patient's tolerance, Ice applied  Home Living                          Prior Function            PT Goals (current goals can now be found in the care plan section) Acute Rehab PT Goals Patient Stated Goal: home Progress towards PT goals: Progressing toward goals    Frequency    Min 4X/week      PT Plan  Current plan remains appropriate    Co-evaluation              AM-PAC PT "6 Clicks" Mobility   Outcome Measure  Help needed turning from your back to your side while in a flat bed without using bedrails?: A Little Help needed moving from lying on your back to sitting on the side of a flat bed without using bedrails?: A Little Help needed moving to and from a bed to a chair (including a wheelchair)?: A Little Help needed standing up from a chair using your arms (e.g., wheelchair or bedside chair)?: A Little Help needed to walk in hospital room?: A Little Help needed climbing 3-5 steps with a railing? : A Little 6 Click Score: 18    End of Session Equipment Utilized During Treatment: Gait belt Activity Tolerance: Patient tolerated treatment well Patient left: in chair;with family/visitor present;with call bell/phone within reach Nurse Communication: Mobility status PT Visit Diagnosis: Unsteadiness on feet (R26.81);Difficulty in walking, not elsewhere classified (R26.2)     Time: 8250-5397 PT Time Calculation (min) (ACUTE ONLY): 28 min  Charges:  $Gait Training: 23-37 mins                     Aida Raider, [place]  Office # [phone] Pager [phone]    Ilda Foil 04/16/2022, 12:21 PM

## 2022-04-16 NOTE — TOC Initial Note (Signed)
Transition of Care [facility]) - Initial/Assessment Note    Patient Details  Name: Christopher Yu MRN: [id number] Date of Birth: 1960-07-20  [facility]) CM/SW Contact:    Ella Bodo, RN Phone Number: 04/16/2022, 4:17 PM  Clinical Narrative:                 Pt is a 61yo male who under went a suboccipital craniotomy for resection of cerebellar AVM.  PTA, pt independent and living at home with spouse.  PT/OT recommending [place] follow up, but per [facility] agency, patient will have high copay from $50-$100.  Met with patient and spouse and discussed possible referral for OP rehab instead; noted patient ambulated 200 feet and was able to do stairs with PT.  Patient and spouse agreeable to OP therapies, and prefer [facility] location. Will make referrals for OP PT/OT at [facility], as desired.   Expected Discharge Plan: OP Rehab Barriers to Discharge: Barriers Resolved        Expected Discharge Plan and Services Expected Discharge Plan: OP Rehab   Discharge Planning Services: CM Consult   Living arrangements for the past 2 months: Single Family Home                                      Prior Living Arrangements/Services Living arrangements for the past 2 months: Single Family Home Lives with:: Spouse Patient language and need for interpreter reviewed:: Yes Do you feel safe going back to the place where you live?: Yes      Need for Family Participation in Patient Care: Yes (Comment) Care giver support system in place?: Yes (comment)   Criminal Activity/Legal Involvement Pertinent to Current Situation/Hospitalization: No - Comment as needed            Emotional Assessment Appearance:: Appears stated age Attitude/Demeanor/Rapport: Engaged Affect (typically observed): Accepting Orientation: : Oriented to Self, Oriented to Place, Oriented to  Time, Oriented to Situation      Admission diagnosis:  Status post craniotomy  [Z98.890] AVM (arteriovenous malformation) brain [Q28.2] Patient Active Problem List   Diagnosis Date Noted   Status post craniotomy 04/11/2022   AVM (arteriovenous malformation) brain 04/11/2022   Impingement syndrome of left shoulder 12/22/2017   Impingement syndrome of right shoulder 12/22/2017   Rheumatoid arthritis involving right knee ([place]) 06/01/2015   Status post total right knee replacement 06/01/2015   Cardiomyopathy ([place]) 05/02/2015   Hyperlipidemia    Sleep apnea in adult    Rheumatoid arthritis ([place])    Chronic systolic heart failure ([place]) 05/01/2015   CAD (coronary artery disease), native coronary artery    PCP:  Lavone Orn, MD Pharmacy:   [id number] [place] [address]Phone: [phone]: [phone] [facility] [place]: [phone]: [phone]    Social Determinants of Health (SDOH) Interventions    Readmission Risk Interventions     No data to display         JReinaldo Raddle RN, BSN  Trauma/Neuro ICU Case Manager [phone]

## 2022-04-16 NOTE — Sedation Documentation (Signed)
Versed 1mg , Fentanyl wasted in sink witness by Dr. .

## 2022-04-16 NOTE — Progress Notes (Signed)
Occupational Therapy Treatment Patient Details Name: Christopher Yu MRN: [id number] DOB: 10/11/1960 Today's Date: 04/16/2022   History of present illness Pt is a 61yo male who under went a suboccipital craniotomy for resection of cerebellar AVM. PMH: RA, anxiety, CAD PSH: L TKA   OT comments  Patient awaiting transport for IR procedure.  OT introduced gross and fine motor coordination exercises: stacking cups using R hand as stabilize assist, and placing straws in a cup.  OT to continue efforts in the acute setting to maximize functional status.  Patient does not have any double vision, but over/under shooting with L hand/arm.     Recommendations for follow up therapy are one component of a multi-disciplinary discharge planning process, led by the attending physician.  Recommendations may be updated based on patient status, additional functional criteria and insurance authorization.    Follow Up Recommendations  Home health OT     Assistance Recommended at Discharge Set up Supervision/Assistance  Patient can return home with the following  A little help with walking and/or transfers;A little help with bathing/dressing/bathroom;Assist for transportation   Equipment Recommendations  BSC/3in1    Recommendations for Other Services      Precautions / Restrictions Precautions Precautions: Fall Restrictions Weight Bearing Restrictions: No                Extremity/Trunk Assessment Upper Extremity Assessment LUE Deficits / Details: decreased coordination. LUE Coordination: decreased fine motor;decreased gross motor   Lower Extremity Assessment Lower Extremity Assessment: Defer to PT evaluation   Cervical / Trunk Assessment Cervical / Trunk Assessment: Neck Surgery    Vision Baseline Vision/History: 1 Wears glasses Patient Visual Report: No change from baseline     Perception Perception Perception: Not tested   Praxis Praxis Praxis: Not tested     Cognition Arousal/Alertness: Awake/alert Behavior During Therapy: WFL for tasks assessed/performed Overall Cognitive Status: Within Functional Limits for tasks assessed                                          Exercises Other Exercises Other Exercises: picking up straws and placing them in a cup.  RUE as stabilize assist. Other Exercises: cup stacking activity to LUE table top.    Shoulder Instructions       General Comments sneakers donned for session                                                                      Frequency  Min 2X/week        Progress Toward Goals  OT Goals(current goals can now be found in the care plan section)  Progress towards OT goals: Progressing toward goals  Acute Rehab OT Goals OT Goal Formulation: With patient Time For Goal Achievement: 04/28/22 Potential to Achieve Goals: Good  Plan Discharge plan remains appropriate    Co-evaluation                 AM-PAC OT "6 Clicks" Daily Activity     Outcome Measure   Help from another person eating meals?: A Little Help from another person taking care of personal grooming?: A Little Help from another  person toileting, which includes using toliet, bedpan, or urinal?: A Little Help from another person bathing (including washing, rinsing, drying)?: A Little Help from another person to put on and taking off regular upper body clothing?: A Little Help from another person to put on and taking off regular lower body clothing?: A Little 6 Click Score: 18    End of Session    OT Visit Diagnosis: Unsteadiness on feet (R26.81)   Activity Tolerance Patient tolerated treatment well   Patient Left in bed;with call bell/phone within reach;with family/visitor present   Nurse Communication          Time: [id number] OT Time Calculation (min): 13 min  Charges: OT General Charges $OT Visit: 1 Visit OT Treatments $Therapeutic Exercise:  8-22 mins  04/16/2022  RP, OTR/L  Acute Rehabilitation Services  Office:  [phone]   Metta Clines 04/16/2022, 3:50 PM

## 2022-04-16 NOTE — Progress Notes (Signed)
Mobility Specialist Progress Note   04/16/22 1655  Mobility  Activity Refused mobility   After max encouragement pt deferring mobility stating they have been moving all day and just got comfortable and would not like to get up. Will follow up tomorrow if time permits.   Frederico Hamman Mobility Specialist Please contact via SecureChat or  Rehab office at [phone]

## 2022-04-16 NOTE — Brief Op Note (Signed)
  NEUROSURGERY BRIEF OPERATIVE  NOTE   PREOP DX: Arteriovenous malformation  POSTOP DX: Same  PROCEDURE: Diagnostic cerebral angiogram  SURGEON: Dr. Lisbeth Renshaw, MD  ANESTHESIA: IV Sedation with Local  APPROACH: Right trans-femoral  EBL: Minimal  SPECIMENS: None  COMPLICATIONS: None  CONDITION: Stable to recovery  FINDINGS (Full report in [facility]): 1. Complete resection of previously seen left cerebellar AVM, withou residual AVM nidus or early venous drainage seen.   Lisbeth Renshaw, MD [facility]

## 2022-04-17 MED ORDER — DIAZEPAM 10 MG PO TABS: 10.0000 mg | ORAL_TABLET | Freq: Three times a day (TID) | ORAL | 0 refills | Status: DC | PRN

## 2022-04-17 MED ORDER — HYDROCODONE-ACETAMINOPHEN 5-325 MG PO TABS: 1.0000 | ORAL_TABLET | ORAL | 0 refills | Status: AC | PRN

## 2022-04-17 NOTE — Progress Notes (Signed)
Order to discharge pt home.  Discharge instructions/AVS given to patient and reviewed - education provided as needed.  Pt advised to call PCP and/or come back to the hospital if there are any problems. Pt verbalized understanding.    Pt waiting on walker to be delivered to room prior to discharge.

## 2022-04-17 NOTE — Plan of Care (Signed)
  Problem: Education: Goal: Knowledge of the prescribed therapeutic regimen will improve Outcome: Adequate for Discharge   Problem: Clinical Measurements: Goal: Usual level of consciousness will be regained or maintained. Outcome: Adequate for Discharge Goal: Neurologic status will improve Outcome: Adequate for Discharge Goal: Ability to maintain intracranial pressure will improve Outcome: Adequate for Discharge   Problem: Skin Integrity: Goal: Demonstration of wound healing without infection will improve Outcome: Adequate for Discharge   Problem: Education: Goal: Knowledge of General Education information will improve Description: Including pain rating scale, medication(s)/side effects and non-pharmacologic comfort measures Outcome: Adequate for Discharge   Problem: Health Behavior/Discharge Planning: Goal: Ability to manage health-related needs will improve Outcome: Adequate for Discharge   Problem: Clinical Measurements: Goal: Ability to maintain clinical measurements within normal limits will improve Outcome: Adequate for Discharge Goal: Will remain free from infection Outcome: Adequate for Discharge Goal: Diagnostic test results will improve Outcome: Adequate for Discharge Goal: Respiratory complications will improve Outcome: Adequate for Discharge Goal: Cardiovascular complication will be avoided Outcome: Adequate for Discharge   Problem: Activity: Goal: Risk for activity intolerance will decrease Outcome: Adequate for Discharge   Problem: Nutrition: Goal: Adequate nutrition will be maintained Outcome: Adequate for Discharge   Problem: Coping: Goal: Level of anxiety will decrease Outcome: Adequate for Discharge   Problem: Elimination: Goal: Will not experience complications related to bowel motility Outcome: Adequate for Discharge Goal: Will not experience complications related to urinary retention Outcome: Adequate for Discharge   Problem: Pain  Managment: Goal: General experience of comfort will improve Outcome: Adequate for Discharge   Problem: Safety: Goal: Ability to remain free from injury will improve Outcome: Adequate for Discharge   Problem: Skin Integrity: Goal: Risk for impaired skin integrity will decrease Outcome: Adequate for Discharge   

## 2022-04-17 NOTE — Progress Notes (Signed)
Order to discharge pt - family requesting walker based on PT recommendations.  Case management notified.

## 2022-04-17 NOTE — Discharge Summary (Signed)
Physician Discharge Summary  Patient ID: Christopher Yu MRN: [id number] DOB/AGE: 1960-10-23 61 y.o.  Admit date: 04/11/2022 Discharge date: 04/17/2022  Admission Diagnoses:  AVM  Discharge Diagnoses:  Same Principal Problem:   Status post craniotomy Active Problems:   AVM (arteriovenous malformation) brain   Discharged Condition: Stable  Hospital Course:  Harding Mcgourty is a 61 y.o. male admitted after elective crani for resection of AVM. He was at baseline postop with significant HA and neck spasms. He was observed in the ICU and then on the floor with progressive improvement in his condition. He underwent postop angiogram revealing complete resection of the AVM. He requested d/c on POD# 6.  Treatments:  Surgery - pos fossa crani for resection of AVM Diagnostic angiogram  Discharge Exam: Blood pressure 110/66, pulse 75, temperature 98.5 F (36.9 C), temperature source Oral, resp. rate 17, height 6' 1.5" (1.867 m), weight 106.6 kg, SpO2 91 %. Awake, alert, oriented Speech fluent, appropriate CN grossly intact 5/5 BUE/BLE Wound c/d/i  Disposition: Discharge disposition: 01-Home or Self Care       Discharge Instructions     Ambulatory referral to Occupational Therapy   Complete by: As directed    Ambulatory referral to Physical Therapy   Complete by: As directed    Call MD for:  redness, tenderness, or signs of infection (pain, swelling, redness, odor or green/yellow discharge around incision site)   Complete by: As directed    Call MD for:  temperature >100.4   Complete by: As directed    Diet - low sodium heart healthy   Complete by: As directed    Discharge instructions   Complete by: As directed    Walk at home as much as possible, at least 4 times / day   Increase activity slowly   Complete by: As directed    Lifting restrictions   Complete by: As directed    No lifting > 10 lbs   May shower / Bathe   Complete by: As  directed    48 hours after surgery   May walk up steps   Complete by: As directed    No dressing needed   Complete by: As directed    Other Restrictions   Complete by: As directed    No bending/twisting at waist      Allergies as of 04/17/2022   No Known Allergies      Medication List     STOP taking these medications    traMADol 50 MG tablet Commonly known as: ULTRAM       TAKE these medications    aspirin 81 MG tablet Take 81 mg by mouth daily.   CALCIUM PO Take 1 tablet by mouth daily.   diazepam 10 MG tablet Commonly known as: VALIUM Take 1 tablet (10 mg total) by mouth every 8 (eight) hours as needed for muscle spasms.   gabapentin 300 MG capsule Commonly known as: NEURONTIN Take 300 mg by mouth 2 (two) times daily.   HYDROcodone-acetaminophen 5-325 MG tablet Commonly known as: NORCO/VICODIN Take 1 tablet by mouth every 4 (four) hours as needed for up to 7 days for moderate pain.   leflunomide 10 MG tablet Commonly known as: ARAVA Take 10 mg by mouth daily.   lisinopril 10 MG tablet Commonly known as: ZESTRIL TAKE 1 TABLET DAILY   methotrexate 2.5 MG tablet Commonly known as: RHEUMATREX Take 20 mg by mouth once a week. Takes 8 tablets(20mg ) every Saturday.  Caution:Chemotherapy. Protect from light.   metoprolol succinate 25 MG 24 hr tablet Commonly known as: TOPROL-XL TAKE 1 TABLET DAILY   predniSONE 5 MG tablet Commonly known as: DELTASONE Take 5 mg by mouth daily as needed (Arthritis).   PRESERVISION AREDS PO Take 2 capsules by mouth daily.   Rituxan 500 MG/50ML injection Generic drug: riTUXimab See admin instructions. Every 6 months 2 dose infusion Ruxience   rosuvastatin 20 MG tablet Commonly known as: CRESTOR Take 1 tablet (20 mg total) by mouth daily.   sertraline 50 MG tablet Commonly known as: ZOLOFT Take 50 mg by mouth every morning.   spironolactone 25 MG tablet Commonly known as: ALDACTONE TAKE 1 TABLET DAILY    sulfaSALAzine 500 MG tablet Commonly known as: AZULFIDINE Take 1,000 mg by mouth 2 (two) times daily.   Systane Complete 0.6 % Soln Generic drug: Propylene Glycol Place 1 drop into both eyes 2 (two) times daily.   TESTOSTERONE IM Inject into the muscle See admin instructions. Every three weeks   Vitamin D3 50 MCG (2000 UT) Tabs Take 2,000 Units by mouth daily.               Discharge Care Instructions  (From admission, onward)           Start     Ordered   04/17/22 0000  No dressing needed        04/17/22 1311            Follow-up Information     [facility]. Call.   Specialty: Rehabilitation Why: Call ASAP to schedule outpatient physical and occupational appts.  An electronic referral has been made on your behalf. Contact information: [address] [place], [address] [id number] mc [place] [address] [phone]        Lisbeth Renshaw, MD Follow up.   Specialty: Neurosurgery Contact information: [address] [place] [address] [phone]                 Signed: Jackelyn Hoehn 04/17/2022, 1:12 PM

## 2022-04-17 NOTE — TOC Transition Note (Signed)
Transition of Care [facility]) - CM/SW Discharge Note   Patient Details  Name: Christopher Yu MRN: [id number] Date of Birth: March 29, 1961  [facility] of Care [facility]) CM/SW Contact:  Ella Bodo, RN Phone Number: 04/17/2022, 2:47pm  Clinical Narrative:    Patient met close stable for discharge home today with spouse.  Outpatient rehab referrals have been made for PT/OT.  Rolling walker requested from [employer] for home.   Final next level of care: OP Rehab Barriers to Discharge: Barriers Resolved   Patient Goals and CMS Choice        Discharge Placement                       Discharge Plan and Services   Discharge Planning Services: CM Consult            DME Arranged: Walker rolling DME Agency: [employer] Date DME Agency Contacted: 04/17/22 Time DME Agency Contacted: [id number] Representative spoke with at DME Agency: [place] ([place]) Interventions     Readmission Risk Interventions     No data to display         Reinaldo Raddle, RN, BSN  Trauma/Neuro ICU Case Manager [phone]

## 2022-04-17 NOTE — Progress Notes (Signed)
Walker arrived to room - pt discharged.

## 2022-04-17 NOTE — Progress Notes (Signed)
  NEUROSURGERY PROGRESS NOTE   Pt seen and examined. No issues overnight. Cont to report improvement in neck pain/spasm.   EXAM: Temp:  [97.7 F (36.5 C)-98.5 F (36.9 C)] 98.5 F (36.9 C) (12/14 1156) Pulse Rate:  [60-93] 75 (12/14 0344) Resp:  [8-19] 17 (12/14 1156) BP: (98-122)/(66-89) 110/66 (12/14 1156) SpO2:  [91 %-100 %] 91 % (12/14 0755) Intake/Output      12/13 0701 12/14 0700 12/14 0701 12/15 0700   P.O.  480   Total Intake(mL/kg)  480 (4.5)   Urine (mL/kg/hr) 750 (0.3) 725 (1.1)   Total Output 750 725   Net -750 -245         Awake, alert, oriented Speech fluent CN intact MAE good strength Incision c/d/I, no leak  LABS: Lab Results  Component Value Date   CREATININE 0.96 04/14/2022   BUN 11 04/14/2022   NA 140 04/14/2022   K 3.9 04/14/2022   CL 105 04/14/2022   CO2 28 04/14/2022   Lab Results  Component Value Date   WBC 8.3 04/03/2022   HGB 10.5 (L) 04/11/2022   HCT 31.0 (L) 04/11/2022   MCV 91.3 04/03/2022   PLT 206 04/03/2022    IMPRESSION: - 61 y.o. male POD# 6 s/p pos fossa crani for AVM resection, doing well. Postop angio revealing complete resection.  PLAN: - d/c home today   Lisbeth Renshaw, MD [facility]

## 2022-04-17 NOTE — Progress Notes (Signed)
PT Cancellation Note  Patient Details Name: Christopher Yu MRN: [id number] DOB: Sep 28, 1960   Cancelled Treatment:    Reason Eval/Treat Not Completed: Patient declined, no reason specified. Pt dressed and waiting for RW to arrive to discharge. Pt declines PT session at this time. PT will continue to follow up as time allows.   Arlyss Gandy 04/17/2022, 3:04 PM

## 2022-04-21 DIAGNOSIS — Q282 Arteriovenous malformation of cerebral vessels: Secondary | ICD-10-CM | POA: Diagnosis not present

## 2022-04-21 NOTE — Therapy (Signed)
OUTPATIENT PHYSICAL THERAPY NEURO EVALUATION   Patient Name: Christopher Yu MRN: [id number] DOB:1960/12/23, 61 y.o., male Today's Date: 04/22/2022   PCP: Kirby Funk, MD  REFERRING PROVIDER: Lisbeth Renshaw, MD  END OF SESSION:  PT End of Session - 04/22/22 1453     Visit Number 1    Number of Visits 17    Date for PT Re-Evaluation 06/17/22    Authorization Type [employer]    Authorization - Visit Number 1    Authorization - Number of Visits 15   30 PT and OT combined   PT Start Time 1320    PT Stop Time 1400    PT Time Calculation (min) 40 min    Equipment Utilized During Treatment Gait belt    Activity Tolerance Patient tolerated treatment well    Behavior During Therapy Flat affect             Past Medical History:  Diagnosis Date   Anxiety    Arthritis, rheumatoid (HCC)    dx 1988   CAD (coronary artery disease), native coronary artery    Mild LAD disease with calcification noted at cath 12//27/16    Cardiomyopathy [facility])    Chronic systolic heart failure (HCC) 05/01/2015   Depression    Deviated nasal septum 06/25/2011   Headache    Hyperlipidemia    Impingement syndrome of left shoulder 12/22/2017   Rheumatoid aortitis    Sleep apnea    does not use cpap - UNABLE TO TOLERATE MASK   Sleep apnea in adult    deviated septum repaired, most recent sleep study was negative   Status post total right knee replacement 06/01/2015   Past Surgical History:  Procedure Laterality Date   ANKLE FUSION Right 2014   related to his arthritis   ANKLE FUSION Left 05/25/13   related to his arthritis   APPLICATION OF CRANIAL NAVIGATION N/A 04/11/2022   Procedure: APPLICATION OF CRANIAL NAVIGATION;  Surgeon: Lisbeth Renshaw, MD;  Location: [facility] OR;  Service: Neurosurgery;  Laterality: N/A;   CARDIAC CATHETERIZATION N/A 05/01/2015   Procedure: Left Heart Cath and Coronary Angiography;  Surgeon: Lyn Records, MD;  Location: [facility] INVASIVE CV LAB;  Service:  Cardiovascular;  Laterality: N/A;   CRANIOTOMY N/A 04/11/2022   Procedure: STEREOTACTIC SUBOCCIPITAL CRANIOTOMY FOR RESECTION OF ARTERIO-VENOUS MALFORMATION;  Surgeon: Lisbeth Renshaw, MD;  Location: [facility] OR;  Service: Neurosurgery;  Laterality: N/A;   FRACTURE SURGERY     left femur fracture x 3   IR ANGIO EXTERNAL CAROTID SEL EXT CAROTID BILAT MOD SED  01/28/2022   IR ANGIO INTRA EXTRACRAN SEL INTERNAL CAROTID BILAT MOD SED  01/28/2022   IR ANGIO INTRA EXTRACRAN SEL INTERNAL CAROTID BILAT MOD SED  04/16/2022   IR ANGIO VERTEBRAL SEL VERTEBRAL BILAT MOD SED  01/28/2022   IR ANGIO VERTEBRAL SEL VERTEBRAL BILAT MOD SED  04/16/2022   KNEE ARTHROSCOPY     right x 4   NASAL SEPTOPLASTY W/ TURBINOPLASTY  06/25/2011   Procedure: NASAL SEPTOPLASTY WITH TURBINATE REDUCTION;  Surgeon: Osborn Coho, MD;  Location: [facility] OR;  Service: ENT;  Laterality: Bilateral;   TONSILLECTOMY     TOTAL KNEE ARTHROPLASTY Right 06/01/2015   Procedure: RIGHT TOTAL KNEE ARTHROPLASTY;  Surgeon: Kathryne Hitch, MD;  Location: [facility] ORS;  Service: Orthopedics;  Laterality: Right;  Block+general   Patient Active Problem List   Diagnosis Date Noted   Status post craniotomy 04/11/2022   AVM (arteriovenous malformation) brain 04/11/2022   Impingement syndrome  of left shoulder 12/22/2017   Impingement syndrome of right shoulder 12/22/2017   Rheumatoid arthritis involving right knee (HCC) 06/01/2015   Status post total right knee replacement 06/01/2015   Cardiomyopathy (HCC) 05/02/2015   Hyperlipidemia    Sleep apnea in adult    Rheumatoid arthritis (HCC)    Chronic systolic heart failure (HCC) 05/01/2015   CAD (coronary artery disease), native coronary artery     ONSET DATE: 04/11/22  REFERRING DIAG: Z16.967 (ICD-10-CM) - Status post craniotomy Q28.2 (ICD-10-CM) - AVM (arteriovenous malformation) brain  THERAPY DIAG:  Unsteadiness on feet  Dizziness and giddiness  Muscle weakness (generalized)  Other  abnormalities of gait and mobility  Rationale for Evaluation and Treatment: Rehabilitation  SUBJECTIVE:                                                                                                                                                                                             SUBJECTIVE STATEMENT: Patient reports that he was D/C'd from the hospital on Thursday with a RW; not using AD at PLOF. Reports lack of stability and wife reports dizziness when getting up or just being upright. No N/V since being home. Dizziness is described as "woozy like I'm going to fall." Worse with movement and continues with movement. Wife reports LOB and having to help him recover. Having trouble getting comfortable in bed d/t incision. Denies hearing loss, tinnitus, photo/phonophobia, migraines. Notes initial diplopia but this has since resolved. Wife reports BP has been a little low at recent MD appointment. Pt accompanied by: significant other  PERTINENT HISTORY: Anxiety, RA, CAD, cardiomyopathy, chronic systolic failure, depression, HA, HLD, R TKA 2017, B ankle fusion 2014/2015, L femur fx surgery x3, craniotomy with resection of AVM 04/11/22  PAIN:  Are you having pain? Yes: NPRS scale: 8/10 Pain location: entire back of the head Pain description: spasm down the incision line Aggravating factors: movement, light, noise Relieving factors: trying to rest, dark, quiet room  PRECAUTIONS: Other: per hospital DC note: No lifting >10 lbs, No bending/twisting at waist   WEIGHT BEARING RESTRICTIONS: No  FALLS: Has patient fallen in last 6 months? No  LIVING ENVIRONMENT: Lives with: lives with their spouse Lives in: House/apartment Stairs:  2 story house with 3 steps to enter with handrail on R Has following equipment at home: Dan Humphreys - 2 wheeled and Tour manager  PLOF: Independent; [facility] in home office which is on 2nd floor; golf  PATIENT GOALS: improve stability   OBJECTIVE:   DIAGNOSTIC  FINDINGS: CEREBRAL ANGIOGRAM 04/16/22: Complete resection of previously identified left cerebellar arteriovenous malformation, without residual nidus or early venous  drainage seen.  COGNITION: Overall cognitive status:  pt notes "trying very little to think"   SENSATION: WFL  COORDINATION: Alternating pronation/supination: dysmetria on L Alternating toe tap: intact B Finger to nose: L mild-mod dysmetria and bradykinesia   MUSCLE TONE: intact B   POSTURE: No Significant postural limitations  OBSERVATION: suboccipital incision well-healing and without red flag signs  PALPATION: no TTP in B shoulders  LOWER EXTREMITY ROM:     Active  Right Eval Left Eval  Hip flexion    Hip extension    Hip abduction    Hip adduction    Hip internal rotation    Hip external rotation    Knee flexion    Knee extension    Ankle dorsiflexion 4 -3  Ankle plantarflexion    Ankle inversion    Ankle eversion     (Blank rows = not tested)  LOWER EXTREMITY MMT:    MMT (in sitting) Right Eval Left Eval  Hip flexion 4+ 4  Hip extension    Hip abduction 4+ 4+  Hip adduction 4+ 4+  Hip internal rotation    Hip external rotation    Knee flexion 4 4  Knee extension 4 4+  Ankle dorsiflexion 4+ 4+  Ankle plantarflexion 4+ 4+  Ankle inversion    Ankle eversion    (Blank rows = not tested)  VESTIBULAR ASSESSMENT   GENERAL OBSERVATION: pt wears bifocals most of the time     OCULOMOTOR EXAM:   Ocular Alignment:  L eye slightly elevated in orbit   Ocular ROM: No Limitations   Spontaneous Nystagmus: absent   Gaze-Induced Nystagmus: absent   Smooth Pursuits:  2-3 saccades in each direction   Saccades:  poor trajectory L, overshooting R, slow vertical   Convergence/Divergence: 1.5 ft before diplopia    VESTIBULAR - OCULAR REFLEX:    Slow VOR: Normal performed within tolerable cervical ROM   VOR Cancellation: Corrective Saccades B   Head-Impulse Test: NT     M-CTSIB  Condition 1:  Firm Surface, EO 10 Sec, Severe and inability to self-correct, required mod A and walker support to steady self    Condition 2: Firm Surface, EC NT Sec,  NT  Sway  Condition 3: Foam Surface, EO NT Sec,  NT  Sway  Condition 4: Foam Surface, EC NT Sec,  NT  Sway      GAIT: Gait pattern:  B short steps with poor safety awareness Assistive device utilized: Walker - 2 wheeled Level of assistance: CGA     TODAY'S TREATMENT:                                                                                                                              DATE: 04/22/22    PATIENT EDUCATION: Education details: prognosis, POC, edu on exam findings, encouraged use of gait belt for safety at home Person educated: Patient and Spouse Education method: Explanation, Demonstration, Tactile cues, and Verbal  cues Education comprehension: verbalized understanding  HOME EXERCISE PROGRAM: Not yet initiated    GOALS: Goals reviewed with patient? Yes  SHORT TERM GOALS: Target date: 05/13/2022  Patient to be independent with initial HEP. Baseline: HEP initiated Goal status: INITIAL    LONG TERM GOALS: Target date: 06/17/2022  Patient to be independent with advanced HEP. Baseline: Not yet initiated  Goal status: INITIAL  Patient to demonstrate B LE strength >/=4+/5.  Baseline: See above Goal status: INITIAL  Patient to report 75% improvement in HAs.  Baseline: - Goal status: INITIAL  Patient to no dizziness with transfers or bed mobility.  Baseline: dizziness Goal status: INITIAL  Patient to complete TUG in <14 sec with LRAD in order to decrease risk of falls.   Baseline: NT Goal status: INITIAL  Patient to demonstrate 5xSTS test in <15 sec in order to decrease risk of falls.  Baseline: NT Goal status: INITIAL  Patient to score at least 40/56 on Berg in order to decrease risk of falls.  Baseline: NT Goal status: INITIAL   ASSESSMENT:  CLINICAL IMPRESSION:  Patient is a 60 y/o  M presenting to OPPT with c/o imbalance and dizziness s/p hospitalization 04/11/22-04/17/22 for suboccipital craniotomy for resection of cerebellar AVM on 04/11/22. Patient today presenting with limited L UE coordination, limited B ankle dorsiflexion ROM, mild hip and knee weakness, significant imbalance, and gait deviations. Oculomotor exam revealed saccades with smooth pursuits, abnormal saccades, convergence insufficiency, and corrective saccades with VOR cancellation.Prior to current episode, patient was independent. Would benefit from skilled PT services 2 x/week for 8 weeks to address aforementioned impairments in order to optimize level of function.    OBJECTIVE IMPAIRMENTS: Abnormal gait, decreased balance, decreased coordination, decreased knowledge of use of DME, difficulty walking, decreased ROM, decreased strength, dizziness, and pain.   ACTIVITY LIMITATIONS: carrying, lifting, bending, sitting, standing, squatting, stairs, transfers, bed mobility, bathing, toileting, dressing, reach over head, and hygiene/grooming  PARTICIPATION LIMITATIONS: meal prep, cleaning, laundry, driving, shopping, community activity, occupation, yard work, and church  PERSONAL FACTORS: Age, Past/current experiences, Time since onset of injury/illness/exacerbation, and 3+ comorbidities: Anxiety, RA, CAD, cardiomyopathy, chronic systolic failure, depression, HA, HLD, R TKA 2017, B ankle fusion 2014/2015, L femur fx surgery x3, craniotomy with resection of AVM 04/11/22  are also affecting patient's functional outcome.   REHAB POTENTIAL: Good  CLINICAL DECISION MAKING: Evolving/moderate complexity  EVALUATION COMPLEXITY: Moderate  PLAN:  PT FREQUENCY: 2x/week  PT DURATION: 8 weeks  PLANNED INTERVENTIONS: Therapeutic exercises, Therapeutic activity, Neuromuscular re-education, Balance training, Gait training, Patient/Family education, Self Care, Joint mobilization, Stair training, Vestibular training, Canalith  repositioning, DME instructions, Aquatic Therapy, Dry Needling, Electrical stimulation, Cryotherapy, Moist heat, Taping, Manual therapy, and Re-evaluation  PLAN FOR NEXT SESSION: assess TUG, 5xSTS, Berg; initiate HEP for gaze stabilization/habituation and balance; possibly check orthostatics if needed   Anette Guarneri, PT, DPT 04/22/22 3:02 PM  [facility] at [facility] [address] [place], [place] [address] Phone # [phone] Fax # [phone]

## 2022-04-22 ENCOUNTER — Other Ambulatory Visit (HOSPITAL_COMMUNITY): Payer: BC Managed Care – PPO

## 2022-04-22 ENCOUNTER — Other Ambulatory Visit: Payer: Self-pay

## 2022-04-22 ENCOUNTER — Ambulatory Visit: Payer: BC Managed Care – PPO | Admitting: Physical Therapy

## 2022-04-22 ENCOUNTER — Ambulatory Visit: Payer: BC Managed Care – PPO | Attending: Neurosurgery | Admitting: Occupational Therapy

## 2022-04-22 ENCOUNTER — Encounter: Payer: Self-pay | Admitting: Physical Therapy

## 2022-04-22 DIAGNOSIS — R2681 Unsteadiness on feet: Secondary | ICD-10-CM

## 2022-04-22 DIAGNOSIS — R41842 Visuospatial deficit: Secondary | ICD-10-CM | POA: Insufficient documentation

## 2022-04-22 DIAGNOSIS — R42 Dizziness and giddiness: Secondary | ICD-10-CM

## 2022-04-22 DIAGNOSIS — M6281 Muscle weakness (generalized): Secondary | ICD-10-CM | POA: Diagnosis not present

## 2022-04-22 DIAGNOSIS — R2689 Other abnormalities of gait and mobility: Secondary | ICD-10-CM

## 2022-04-22 DIAGNOSIS — R278 Other lack of coordination: Secondary | ICD-10-CM | POA: Diagnosis not present

## 2022-04-22 NOTE — Therapy (Signed)
OUTPATIENT OCCUPATIONAL THERAPY NEURO EVALUATION  Patient Name: Christopher Yu MRN: [id number] DOB:1960-09-11, 61 y.o., male Today's Date: 04/22/2022  PCP: Kirby Funk, MD REFERRING PROVIDER: Lisbeth Renshaw, MD  END OF SESSION:  OT End of Session - 04/22/22 1359     Visit Number 1    Number of Visits 17    Date for OT Re-Evaluation 06/20/22    Authorization Type [employer]    OT Start Time 1232    OT Stop Time 1317    OT Time Calculation (min) 45 min    Activity Tolerance Patient tolerated treatment well    Behavior During Therapy [facility] for tasks assessed/performed;Flat affect             Past Medical History:  Diagnosis Date   Anxiety    Arthritis, rheumatoid (HCC)    dx 1988   CAD (coronary artery disease), native coronary artery    Mild LAD disease with calcification noted at cath 12//27/16    Cardiomyopathy [facility])    Chronic systolic heart failure (HCC) 05/01/2015   Depression    Deviated nasal septum 06/25/2011   Headache    Hyperlipidemia    Impingement syndrome of left shoulder 12/22/2017   Rheumatoid aortitis    Sleep apnea    does not use cpap - UNABLE TO TOLERATE MASK   Sleep apnea in adult    deviated septum repaired, most recent sleep study was negative   Status post total right knee replacement 06/01/2015   Past Surgical History:  Procedure Laterality Date   ANKLE FUSION Right 2014   related to his arthritis   ANKLE FUSION Left 05/25/13   related to his arthritis   APPLICATION OF CRANIAL NAVIGATION N/A 04/11/2022   Procedure: APPLICATION OF CRANIAL NAVIGATION;  Surgeon: Lisbeth Renshaw, MD;  Location: [facility] OR;  Service: Neurosurgery;  Laterality: N/A;   CARDIAC CATHETERIZATION N/A 05/01/2015   Procedure: Left Heart Cath and Coronary Angiography;  Surgeon: Lyn Records, MD;  Location: [facility] INVASIVE CV LAB;  Service: Cardiovascular;  Laterality: N/A;   CRANIOTOMY N/A 04/11/2022   Procedure: STEREOTACTIC SUBOCCIPITAL CRANIOTOMY FOR  RESECTION OF ARTERIO-VENOUS MALFORMATION;  Surgeon: Lisbeth Renshaw, MD;  Location: [facility] OR;  Service: Neurosurgery;  Laterality: N/A;   FRACTURE SURGERY     left femur fracture x 3   IR ANGIO EXTERNAL CAROTID SEL EXT CAROTID BILAT MOD SED  01/28/2022   IR ANGIO INTRA EXTRACRAN SEL INTERNAL CAROTID BILAT MOD SED  01/28/2022   IR ANGIO INTRA EXTRACRAN SEL INTERNAL CAROTID BILAT MOD SED  04/16/2022   IR ANGIO VERTEBRAL SEL VERTEBRAL BILAT MOD SED  01/28/2022   IR ANGIO VERTEBRAL SEL VERTEBRAL BILAT MOD SED  04/16/2022   KNEE ARTHROSCOPY     right x 4   NASAL SEPTOPLASTY W/ TURBINOPLASTY  06/25/2011   Procedure: NASAL SEPTOPLASTY WITH TURBINATE REDUCTION;  Surgeon: Osborn Coho, MD;  Location: [facility] OR;  Service: ENT;  Laterality: Bilateral;   TONSILLECTOMY     TOTAL KNEE ARTHROPLASTY Right 06/01/2015   Procedure: RIGHT TOTAL KNEE ARTHROPLASTY;  Surgeon: Kathryne Hitch, MD;  Location: [facility] ORS;  Service: Orthopedics;  Laterality: Right;  Block+general   Patient Active Problem List   Diagnosis Date Noted   Status post craniotomy 04/11/2022   AVM (arteriovenous malformation) brain 04/11/2022   Impingement syndrome of left shoulder 12/22/2017   Impingement syndrome of right shoulder 12/22/2017   Rheumatoid arthritis involving right knee (HCC) 06/01/2015   Status post total right knee replacement 06/01/2015  Cardiomyopathy (HCC) 05/02/2015   Hyperlipidemia    Sleep apnea in adult    Rheumatoid arthritis (HCC)    Chronic systolic heart failure (HCC) 05/01/2015   CAD (coronary artery disease), native coronary artery     ONSET DATE: 04/11/22  REFERRING DIAG: E94.076 (ICD-10-CM) - Status post craniotomy Q28.2 (ICD-10-CM) - AVM (arteriovenous malformation) brain  THERAPY DIAG:  Other lack of coordination  Other abnormalities of gait and mobility  Unsteadiness on feet  Visuospatial deficit  Rationale for Evaluation and Treatment: Rehabilitation  SUBJECTIVE:   SUBJECTIVE  STATEMENT: 61 y.o. male s/p pos fossa crani for AVM resection on 04/11/22. Postop angio revealing complete resection. Pt is currently requiring min assist for ambulation with RW and Min-max assist for ADLs due to decreased initiation, fatigue, headache, and precautions limiting bending.  Pt reports diplopia has resolved, however experiencing light sensitivity and headaches.  Pt is keeping his eyes closed frequently. Pt accompanied by: self and significant other  PERTINENT HISTORY:  Pt is s/p suboccipital craniotomy for resection of cerebellar AVM. PMH: RA, anxiety, CAD PSH: L TKA  PRECAUTIONS: Other: no bending, twisting, lifting > 10#  WEIGHT BEARING RESTRICTIONS:  no lifting > 10#  PAIN:  Are you having pain? Yes: NPRS scale: 8/10 Pain location: headache Pain description: nonstop, dull/nagging but can become sharp/shooting Aggravating factors: movement, lights Relieving factors: nothing, however has been using ice packs to alleviate  FALLS: Has patient fallen in last 6 months? No  LIVING ENVIRONMENT: Lives with: lives with their family (wife, and 65 and 68 yo daughters) Lives in: House/apartment Stairs: Yes: Internal: full flight of steps to home office, however is currently not having to navigate steps; on right going up and External: 3 steps; on right going up Has following equipment at home: Dan Humphreys - 2 wheeled and shower chair  PLOF: Independent, Independent with basic ADLs, Independent with household mobility without device, and Vocation/Vocational requirements: working as a Production designer, theatre/television/film in [employer], requires occasional travel by plane  PATIENT GOALS: to be able to move without having to use a walker  OBJECTIVE:   HAND DOMINANCE: Right  ADLs: Transfers/ambulation related to ADLs: Min A with RW Eating: wife will assist with cutting up foods, opening items, and occasionally placing items in his hand Grooming: Supervision when standing at sink UB Dressing: Min assist LB  Dressing: Mod-max assist Toileting: wife will assist with pulling up pants/hygiene, min assist for transfers off toilet Bathing: supervision/cues for bathing, wife completes 5% Tub Shower transfers: Mod-max assist transfers in/out of shower Equipment: Shower seat without back, Walk in shower, Long handled shoe horn, and Long handled sponge  IADLs: Community mobility: not driving Medication management: Wife setting him up with meds Financial management: pt was primary, however wife doing at this time.  MOBILITY STATUS: Needs Assist: Min A with RW  ACTIVITY TOLERANCE: Activity tolerance: diminished  FUNCTIONAL OUTCOME MEASURES: Myrtis Ser Index of Independence in ADLs:2/6  UPPER EXTREMITY ROM:   WFL bilaterally, mild decreased coordination with reaching overhead and into internal/external rotation.  UPPER EXTREMITY MMT:    WFL bilaterally  HAND FUNCTION: Grip strength: Right: 80 lbs; Left: 81 lbs  COORDINATION: Finger Nose Finger test: overshooting mildly with LUE 9 Hole Peg test: Right: 36.38 sec; Left: 1:00.87 sec Box and Blocks:  Right 41 blocks, Left 35 blocks  SENSATION: WFL  COGNITION: Overall cognitive status: Difficulty to assess due to: flat affect and decreased engagement in tasks s/p surgery Pt reports that he has been avoiding his phone, television,  and bill paying, anything that makes him think. Spouse reports he is on a lot of medications which may also be impacting his arousal/cognition.   VISION: Subjective report: no changes Baseline vision: Bifocals and Wears glasses all the time Visual history: macular degeneration  VISION ASSESSMENT: To be further assessed in functional context Ocular ROM: WFL Tracking/Visual pursuits: Able to track stimulus in all quads without difficulty Saccades: additional eye shifts occurred during testing, decreased speed of saccadic movements, and additional eye shifts in B eyes when looking to L  Patient has difficulty with  following activities due to following visual impairments: is currently not reading, looking at phone or tv  OBSERVATIONS: flat affect, closing eyes frequently   TODAY'S TREATMENT:                                                               NA eval only  PATIENT EDUCATION: Education details: Educated on role and purpose of OT as well as potential interventions and goals for therapy based on initial evaluation findings. Person educated: Patient and Spouse Education method: Explanation Education comprehension: verbalized understanding and needs further education  HOME EXERCISE PROGRAM: TBD   GOALS: Goals reviewed with patient? No  SHORT TERM GOALS: Target date: 05/23/22  Pt will be independent in HEP for fine and gross motor control. Baseline: Goal status: INITIAL  2.  Pt will verbalize understanding of adaptive strategies, task modifications, and/or potential A/E needs to increase ease, safety, and independence w/ ADLs. Baseline:  Goal status: INITIAL  3.  Pt will demonstrate improved ease with sit > stand to get up from toilet or other seating options in home by improving score on sit > stand to decreased fall risk.  Baseline: time TBD Goal status: INITIAL  4.  Pt will perform dynamic standing task for 5 mins as needed for simple IADLs w/o LOB using DME and/or countertop support prn   Baseline:  Goal status: INITIAL  5.  Pt will demonstrate improved fine motor coordination for ADLs as evidenced by decreasing 9 hole peg test score for 5 by 3 secs  Baseline:  Goal status: INITIAL   LONG TERM GOALS: Target date: 06/20/22  Pt will demonstrate shower transfers with DME PRN at supervision level. Baseline: currently sponge bathing due to fearfulness of showering, but wants to return to showers.   Goal status: INITIAL   Pt will report increased participation in IADLs (such as cooking) to increase independence with functional IADLs.  Baseline:  Goal status: INITIAL   Pt  will complete UB dressing, to include clothing fasteners, at Mod I level with use of AE and/or alternative strategies PRN. Baseline:  Goal status: INITIAL   Pt will complete LB dressing with Supervision/setup with use of AE and/or alternative strategies PRN. Baseline:  Goal status: INITIAL   Pt will demonstrate improved fine motor coordination for ADLs as evidenced by decreasing 9 hole peg test score for LUE by 10 secs to increase participation in medication management. Baseline: Goal status: INITIAL  ASSESSMENT:  CLINICAL IMPRESSION: Patient is a 61 y.o. male who was seen today for occupational therapy evaluation for impairments s/p suboccipital craniotomy for resection of cerebellar AVM. 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  Pt will benefit from skilled occupational therapy services to address coordination, pain management, balance, GM/FM control, cognition, safety awareness, introduction of compensatory strategies/AE prn, visual-perception, and implementation of an HEP to improve participation and safety during ADLs and IADLs.   PERFORMANCE DEFICITS: in functional skills including ADLs, IADLs, coordination, pain, Fine motor control, Gross motor control, balance, body mechanics, endurance, decreased knowledge of precautions, decreased knowledge of use of DME, vision, and UE functional use, cognitive skills including energy/drive, problem solving, and thought, and psychosocial skills including environmental adaptation, habits, and routines and behaviors.   IMPAIRMENTS: are limiting patient from ADLs and IADLs.   CO-MORBIDITIES: may have co-morbidities  that affects occupational performance. Patient will benefit from skilled OT to address above impairments and improve overall function.  MODIFICATION OR ASSISTANCE TO  COMPLETE EVALUATION: Min-Moderate modification of tasks or assist with assess necessary to complete an evaluation.  OT OCCUPATIONAL PROFILE AND HISTORY: Detailed assessment: Review of records and additional review of physical, cognitive, psychosocial history related to current functional performance.  CLINICAL DECISION MAKING: Moderate - several treatment options, min-mod task modification necessary  REHAB POTENTIAL: Good  EVALUATION COMPLEXITY: Moderate    PLAN:  OT FREQUENCY: 2x/week  OT DURATION: 8 weeks  PLANNED INTERVENTIONS: self care/ADL training, therapeutic exercise, therapeutic activity, neuromuscular re-education, balance training, functional mobility training, compression bandaging, moist heat, cryotherapy, patient/family education, cognitive remediation/compensation, visual/perceptual remediation/compensation, psychosocial skills training, energy conservation, coping strategies training, and DME and/or AE instructions  RECOMMENDED OTHER SERVICES: NA  CONSULTED AND AGREED WITH PLAN OF CARE: Patient and family member/caregiver  PLAN FOR NEXT SESSION: Initiate vision exercises (for saccades), educate on AE/DME for LB bathing/dressing, functional transfers for shower transfers   [place], [facility], OTR/L 04/22/2022, 2:00 PM

## 2022-04-23 NOTE — Therapy (Signed)
OUTPATIENT PHYSICAL THERAPY NEURO TREATMENT   Patient Name: Christopher Yu MRN: [id number] DOB:[birth year], 61 y.o., male Today's Date: 04/24/2022   PCP: Kirby Funk, MD  REFERRING PROVIDER: Lisbeth Renshaw, MD  END OF SESSION:  PT End of Session - 04/24/22 1153     Visit Number 2    Number of Visits 17    Date for PT Re-Evaluation 06/17/22    Authorization Type [employer]    Authorization - Visit Number 2    Authorization - Number of Visits 15   30 PT and OT combined   PT Start Time 1104    PT Stop Time 1149    PT Time Calculation (min) 45 min    Equipment Utilized During Treatment Gait belt    Activity Tolerance Patient tolerated treatment well;Patient limited by fatigue    Behavior During Therapy Flat affect              Past Medical History:  Diagnosis Date   Anxiety    Arthritis, rheumatoid (HCC)    dx 1988   CAD (coronary artery disease), native coronary artery    Mild LAD disease with calcification noted at cath 12//27/16    Cardiomyopathy [facility])    Chronic systolic heart failure (HCC) 05/01/2015   Depression    Deviated nasal septum 06/25/2011   Headache    Hyperlipidemia    Impingement syndrome of left shoulder 12/22/2017   Rheumatoid aortitis    Sleep apnea    does not use cpap - UNABLE TO TOLERATE MASK   Sleep apnea in adult    deviated septum repaired, most recent sleep study was negative   Status post total right knee replacement 06/01/2015   Past Surgical History:  Procedure Laterality Date   ANKLE FUSION Right 2014   related to his arthritis   ANKLE FUSION Left 05/25/13   related to his arthritis   APPLICATION OF CRANIAL NAVIGATION N/A 04/11/2022   Procedure: APPLICATION OF CRANIAL NAVIGATION;  Surgeon: Lisbeth Renshaw, MD;  Location: [facility] OR;  Service: Neurosurgery;  Laterality: N/A;   CARDIAC CATHETERIZATION N/A 05/01/2015   Procedure: Left Heart Cath and Coronary Angiography;  Surgeon: Lyn Records, MD;  Location: [facility]  INVASIVE CV LAB;  Service: Cardiovascular;  Laterality: N/A;   CRANIOTOMY N/A 04/11/2022   Procedure: STEREOTACTIC SUBOCCIPITAL CRANIOTOMY FOR RESECTION OF ARTERIO-VENOUS MALFORMATION;  Surgeon: Lisbeth Renshaw, MD;  Location: [facility] OR;  Service: Neurosurgery;  Laterality: N/A;   FRACTURE SURGERY     left femur fracture x 3   IR ANGIO EXTERNAL CAROTID SEL EXT CAROTID BILAT MOD SED  01/28/2022   IR ANGIO INTRA EXTRACRAN SEL INTERNAL CAROTID BILAT MOD SED  01/28/2022   IR ANGIO INTRA EXTRACRAN SEL INTERNAL CAROTID BILAT MOD SED  04/16/2022   IR ANGIO VERTEBRAL SEL VERTEBRAL BILAT MOD SED  01/28/2022   IR ANGIO VERTEBRAL SEL VERTEBRAL BILAT MOD SED  04/16/2022   KNEE ARTHROSCOPY     right x 4   NASAL SEPTOPLASTY W/ TURBINOPLASTY  06/25/2011   Procedure: NASAL SEPTOPLASTY WITH TURBINATE REDUCTION;  Surgeon: Osborn Coho, MD;  Location: [facility] OR;  Service: ENT;  Laterality: Bilateral;   TONSILLECTOMY     TOTAL KNEE ARTHROPLASTY Right 06/01/2015   Procedure: RIGHT TOTAL KNEE ARTHROPLASTY;  Surgeon: Kathryne Hitch, MD;  Location: [facility] ORS;  Service: Orthopedics;  Laterality: Right;  Block+general   Patient Active Problem List   Diagnosis Date Noted   Status post craniotomy 04/11/2022   AVM (arteriovenous malformation) brain 04/11/2022  Impingement syndrome of left shoulder 12/22/2017   Impingement syndrome of right shoulder 12/22/2017   Rheumatoid arthritis involving right knee (HCC) 06/01/2015   Status post total right knee replacement 06/01/2015   Cardiomyopathy (HCC) 05/02/2015   Hyperlipidemia    Sleep apnea in adult    Rheumatoid arthritis (HCC)    Chronic systolic heart failure (HCC) 05/01/2015   CAD (coronary artery disease), native coronary artery     ONSET DATE: 04/11/22  REFERRING DIAG: B93.903 (ICD-10-CM) - Status post craniotomy Q28.2 (ICD-10-CM) - AVM (arteriovenous malformation) brain  THERAPY DIAG:  Unsteadiness on feet  Dizziness and giddiness  Muscle weakness  (generalized)  Other abnormalities of gait and mobility  Rationale for Evaluation and Treatment: Rehabilitation  SUBJECTIVE:                                                                                                                                                                                             SUBJECTIVE STATEMENT: No recent changes. Wife reports that he continues to be unstable. Had a fall into a wall without head injury. Wife reports reports that BP was 105/58 mmHg and HR 57 but not sure if this is normal for him.  Pt accompanied by: significant other  PERTINENT HISTORY: Anxiety, RA, CAD, cardiomyopathy, chronic systolic failure, depression, HA, HLD, R TKA 2017, B ankle fusion 2014/2015, L femur fx surgery x3, craniotomy with resection of AVM 04/11/22  PAIN:  Are you having pain? Yes: NPRS scale: 5/10 Pain location: entire back of the head Pain description: spasm down the incision line Aggravating factors: movement, light, noise Relieving factors: trying to rest, dark, quiet room  PRECAUTIONS: Other: per hospital DC note: No lifting >10 lbs, No bending/twisting at waist   WEIGHT BEARING RESTRICTIONS: No  FALLS: Has patient fallen in last 6 months? No  LIVING ENVIRONMENT: Lives with: lives with their spouse Lives in: House/apartment Stairs:  2 story house with 3 steps to enter with handrail on R Has following equipment at home: Environmental consultant - 2 wheeled and Shower bench  PLOF: Independent; WFH in home office which is on 2nd floor; golf  PATIENT GOALS: improve stability   OBJECTIVE:     TODAY'S TREATMENT: 04/24/22 Activity Comments  TUG 31.36 sec with RW  5xSTS 31.18 sec with B armrests   Berg 23/56  In II bars with mirror feedback: Standing balance Romberg Standing R/L and ant/pos wt shifts  Marching  Hesitancy with L wt shift and requiring additional verbal cues to avoid tipping onto toes with poor awareness and self-recovery; noted L knee pain and  fatigue- required sit break  [facility] PT Assessment - 04/24/22 0001       Standardized Balance Assessment   Standardized Balance Assessment Berg Balance Test      Berg Balance Test   Sit to Stand Able to stand without using hands and stabilize independently    Standing Unsupported Able to stand 30 seconds unsupported    Sitting with Back Unsupported but Feet Supported on Floor or Stool Able to sit safely and securely 2 minutes    Stand to Sit Sits independently, has uncontrolled descent    Transfers Able to transfer with verbal cueing and /or supervision    Standing Unsupported with Eyes Closed Able to stand 10 seconds with supervision    Standing Unsupported with Feet Together Needs help to attain position but able to stand for 30 seconds with feet together    From Standing, Reach Forward with Outstretched Arm Reaches forward but needs supervision    From Standing Position, Pick up Object from Floor Unable to try/needs assist to keep balance   NT d/t precautions   From Standing Position, Turn to Look Behind Over each Shoulder Turn sideways only but maintains balance    Turn 360 Degrees Needs assistance while turning   near-fall   Standing Unsupported, Alternately Place Feet on Step/Stool Needs assistance to keep from falling or unable to try    Standing Unsupported, One Foot in Front Able to plae foot ahead of the other independently and hold 30 seconds    Standing on One Leg Unable to try or needs assist to prevent fall    Total Score 23              PATIENT EDUCATION: Education details: answered wife's questions on patient's status and use of adaptive equipment/DME for safer toilet transfers; HEP- to be performed at counter with gait belt and wife's supervision; wife with concerns about somewhat if a decline in pt's function since leaving the hospital despite no overt red flag s/sx- encouraged to continue monitoring patient and vitals and keep MD aware of changes  Person  educated: Patient and Spouse Education method: Explanation, Demonstration, Tactile cues, Verbal cues, and Handouts Education comprehension: verbalized understanding and returned demonstration    Below measures were taken at time of initial evaluation unless otherwise specified:  DIAGNOSTIC FINDINGS: CEREBRAL ANGIOGRAM 04/16/22: Complete resection of previously identified left cerebellar arteriovenous malformation, without residual nidus or early venous drainage seen.  COGNITION: Overall cognitive status:  pt notes "trying very little to think"   SENSATION: WFL  COORDINATION: Alternating pronation/supination: dysmetria on L Alternating toe tap: intact B Finger to nose: L mild-mod dysmetria and bradykinesia   MUSCLE TONE: intact B   POSTURE: No Significant postural limitations  OBSERVATION: suboccipital incision well-healing and without red flag signs  PALPATION: no TTP in B shoulders  LOWER EXTREMITY ROM:     Active  Right Eval Left Eval  Hip flexion    Hip extension    Hip abduction    Hip adduction    Hip internal rotation    Hip external rotation    Knee flexion    Knee extension    Ankle dorsiflexion 4 -3  Ankle plantarflexion    Ankle inversion    Ankle eversion     (Blank rows = not tested)  LOWER EXTREMITY MMT:    MMT (in sitting) Right Eval Left Eval  Hip flexion 4+ 4  Hip extension    Hip abduction 4+ 4+  Hip adduction 4+ 4+  Hip internal rotation  Hip external rotation    Knee flexion 4 4  Knee extension 4 4+  Ankle dorsiflexion 4+ 4+  Ankle plantarflexion 4+ 4+  Ankle inversion    Ankle eversion    (Blank rows = not tested)  VESTIBULAR ASSESSMENT   GENERAL OBSERVATION: pt wears bifocals most of the time     OCULOMOTOR EXAM:   Ocular Alignment:  L eye slightly elevated in orbit   Ocular ROM: No Limitations   Spontaneous Nystagmus: absent   Gaze-Induced Nystagmus: absent   Smooth Pursuits:  2-3 saccades in each  direction   Saccades:  poor trajectory L, overshooting R, slow vertical   Convergence/Divergence: 1.5 ft before diplopia    VESTIBULAR - OCULAR REFLEX:    Slow VOR: Normal performed within tolerable cervical ROM   VOR Cancellation: Corrective Saccades B   Head-Impulse Test: NT     M-CTSIB  Condition 1: Firm Surface, EO 10 Sec, Severe and inability to self-correct, required mod A and walker support to steady self    Condition 2: Firm Surface, EC NT Sec,  NT  Sway  Condition 3: Foam Surface, EO NT Sec,  NT  Sway  Condition 4: Foam Surface, EC NT Sec,  NT  Sway      GAIT: Gait pattern:  B short steps with poor safety awareness Assistive device utilized: Walker - 2 wheeled Level of assistance: CGA     TODAY'S TREATMENT:                                                                                                                              DATE: 04/22/22    PATIENT EDUCATION: Education details: prognosis, POC, edu on exam findings, encouraged use of gait belt for safety at home Person educated: Patient and Spouse Education method: Explanation, Demonstration, Tactile cues, and Verbal cues Education comprehension: verbalized understanding  HOME EXERCISE PROGRAM: Not yet initiated    GOALS: Goals reviewed with patient? Yes  SHORT TERM GOALS: Target date: 05/13/2022  Patient to be independent with initial HEP. Baseline: HEP initiated Goal status: IN PROGRESS    LONG TERM GOALS: Target date: 06/17/2022  Patient to be independent with advanced HEP. Baseline: Not yet initiated  Goal status: IN PROGRESS  Patient to demonstrate B LE strength >/=4+/5.  Baseline: See above Goal status: IN PROGRESS  Patient to report 75% improvement in HAs.  Baseline: - Goal status: IN PROGRESS  Patient to no dizziness with transfers or bed mobility.  Baseline: dizziness Goal status: IN PROGRESS  Patient to complete TUG in <14 sec with LRAD in order to decrease risk of falls.    Baseline: 31.36 sec with RW Goal status: IN PROGRESS  Patient to demonstrate 5xSTS test in <15 sec in order to decrease risk of falls.  Baseline: 31.18 sec with B armrests Goal status: IN PROGRESS  Patient to score at least 40/56 on Berg in order to decrease risk of falls.  Baseline: 23/56 Goal  status: IN PROGRESS   ASSESSMENT:  CLINICAL IMPRESSION: Patient arrived to session with wife with near-fall occurring as he doffed his jacket at start of session. Patient continues with poor safety awareness and lack of awareness of limits of stability in standing. Wife reports low BP and HR, however upon chart review, vitals were similar at cardiology appointment in October (BP 104/66; Pulse 52). All balance testing today indicates an increased risk of falls- wife reports gait belt is in the mail. Worked on standing balance in parallel bars with use of mirror for max awareness. Patient limited by L knee pain and fatigue, but able to tolerate duration of session. Patient and wife reported understanding of all edu provided and without complaints at end of session except fatigue.     OBJECTIVE IMPAIRMENTS: Abnormal gait, decreased balance, decreased coordination, decreased knowledge of use of DME, difficulty walking, decreased ROM, decreased strength, dizziness, and pain.   ACTIVITY LIMITATIONS: carrying, lifting, bending, sitting, standing, squatting, stairs, transfers, bed mobility, bathing, toileting, dressing, reach over head, and hygiene/grooming  PARTICIPATION LIMITATIONS: meal prep, cleaning, laundry, driving, shopping, community activity, occupation, yard work, and church  PERSONAL FACTORS: Age, Past/current experiences, Time since onset of injury/illness/exacerbation, and 3+ comorbidities: Anxiety, RA, CAD, cardiomyopathy, chronic systolic failure, depression, HA, HLD, R TKA 2017, B ankle fusion 2014/2015, L femur fx surgery x3, craniotomy with resection of AVM 04/11/22  are also affecting  patient's functional outcome.   REHAB POTENTIAL: Good  CLINICAL DECISION MAKING: Evolving/moderate complexity  EVALUATION COMPLEXITY: Moderate  PLAN:  PT FREQUENCY: 2x/week  PT DURATION: 8 weeks  PLANNED INTERVENTIONS: Therapeutic exercises, Therapeutic activity, Neuromuscular re-education, Balance training, Gait training, Patient/Family education, Self Care, Joint mobilization, Stair training, Vestibular training, Canalith repositioning, DME instructions, Aquatic Therapy, Dry Needling, Electrical stimulation, Cryotherapy, Moist heat, Taping, Manual therapy, and Re-evaluation  PLAN FOR NEXT SESSION: progress HEP for gaze stabilization/habituation and balance; possibly check orthostatics if needed   Anette Guarneri, PT, DPT 04/24/22 11:59 AM  [facility] at [facility] [address], [address] [place], [place] [address] Phone # [phone] Fax # [phone]

## 2022-04-24 ENCOUNTER — Ambulatory Visit: Payer: BC Managed Care – PPO | Admitting: Physical Therapy

## 2022-04-24 ENCOUNTER — Ambulatory Visit: Payer: BC Managed Care – PPO | Admitting: Occupational Therapy

## 2022-04-24 ENCOUNTER — Encounter: Payer: Self-pay | Admitting: Physical Therapy

## 2022-04-24 DIAGNOSIS — R2689 Other abnormalities of gait and mobility: Secondary | ICD-10-CM | POA: Diagnosis not present

## 2022-04-24 DIAGNOSIS — R42 Dizziness and giddiness: Secondary | ICD-10-CM

## 2022-04-24 DIAGNOSIS — R2681 Unsteadiness on feet: Secondary | ICD-10-CM | POA: Diagnosis not present

## 2022-04-24 DIAGNOSIS — R278 Other lack of coordination: Secondary | ICD-10-CM

## 2022-04-24 DIAGNOSIS — R41842 Visuospatial deficit: Secondary | ICD-10-CM | POA: Diagnosis not present

## 2022-04-24 DIAGNOSIS — M6281 Muscle weakness (generalized): Secondary | ICD-10-CM

## 2022-04-24 NOTE — Therapy (Signed)
OUTPATIENT OCCUPATIONAL THERAPY  Treatment Note  Patient Name: Christopher Yu MRN: [id number] DOB:October 25, 1960, 61 y.o., male Today's Date: 04/24/2022  PCP: Kirby Funk, MD REFERRING PROVIDER: Lisbeth Renshaw, MD  END OF SESSION:  OT End of Session - 04/24/22 1201     Visit Number 2    Number of Visits 17    Date for OT Re-Evaluation 06/20/22    Authorization Type [employer]    OT Start Time 1151    OT Stop Time 1229    OT Time Calculation (min) 38 min    Activity Tolerance Patient tolerated treatment well    Behavior During Therapy [facility] for tasks assessed/performed;Flat affect              Past Medical History:  Diagnosis Date   Anxiety    Arthritis, rheumatoid (HCC)    dx 1988   CAD (coronary artery disease), native coronary artery    Mild LAD disease with calcification noted at cath 12//27/16    Cardiomyopathy [facility])    Chronic systolic heart failure (HCC) 05/01/2015   Depression    Deviated nasal septum 06/25/2011   Headache    Hyperlipidemia    Impingement syndrome of left shoulder 12/22/2017   Rheumatoid aortitis    Sleep apnea    does not use cpap - UNABLE TO TOLERATE MASK   Sleep apnea in adult    deviated septum repaired, most recent sleep study was negative   Status post total right knee replacement 06/01/2015   Past Surgical History:  Procedure Laterality Date   ANKLE FUSION Right 2014   related to his arthritis   ANKLE FUSION Left 05/25/13   related to his arthritis   APPLICATION OF CRANIAL NAVIGATION N/A 04/11/2022   Procedure: APPLICATION OF CRANIAL NAVIGATION;  Surgeon: Lisbeth Renshaw, MD;  Location: [facility] OR;  Service: Neurosurgery;  Laterality: N/A;   CARDIAC CATHETERIZATION N/A 05/01/2015   Procedure: Left Heart Cath and Coronary Angiography;  Surgeon: Lyn Records, MD;  Location: [facility] INVASIVE CV LAB;  Service: Cardiovascular;  Laterality: N/A;   CRANIOTOMY N/A 04/11/2022   Procedure: STEREOTACTIC SUBOCCIPITAL CRANIOTOMY FOR  RESECTION OF ARTERIO-VENOUS MALFORMATION;  Surgeon: Lisbeth Renshaw, MD;  Location: [facility] OR;  Service: Neurosurgery;  Laterality: N/A;   FRACTURE SURGERY     left femur fracture x 3   IR ANGIO EXTERNAL CAROTID SEL EXT CAROTID BILAT MOD SED  01/28/2022   IR ANGIO INTRA EXTRACRAN SEL INTERNAL CAROTID BILAT MOD SED  01/28/2022   IR ANGIO INTRA EXTRACRAN SEL INTERNAL CAROTID BILAT MOD SED  04/16/2022   IR ANGIO VERTEBRAL SEL VERTEBRAL BILAT MOD SED  01/28/2022   IR ANGIO VERTEBRAL SEL VERTEBRAL BILAT MOD SED  04/16/2022   KNEE ARTHROSCOPY     right x 4   NASAL SEPTOPLASTY W/ TURBINOPLASTY  06/25/2011   Procedure: NASAL SEPTOPLASTY WITH TURBINATE REDUCTION;  Surgeon: Osborn Coho, MD;  Location: [facility] OR;  Service: ENT;  Laterality: Bilateral;   TONSILLECTOMY     TOTAL KNEE ARTHROPLASTY Right 06/01/2015   Procedure: RIGHT TOTAL KNEE ARTHROPLASTY;  Surgeon: Kathryne Hitch, MD;  Location: [facility] ORS;  Service: Orthopedics;  Laterality: Right;  Block+general   Patient Active Problem List   Diagnosis Date Noted   Status post craniotomy 04/11/2022   AVM (arteriovenous malformation) brain 04/11/2022   Impingement syndrome of left shoulder 12/22/2017   Impingement syndrome of right shoulder 12/22/2017   Rheumatoid arthritis involving right knee (HCC) 06/01/2015   Status post total right knee replacement 06/01/2015  Cardiomyopathy (HCC) 05/02/2015   Hyperlipidemia    Sleep apnea in adult    Rheumatoid arthritis (HCC)    Chronic systolic heart failure (HCC) 05/01/2015   CAD (coronary artery disease), native coronary artery     ONSET DATE: 04/11/22  REFERRING DIAG: Y18.563 (ICD-10-CM) - Status post craniotomy Q28.2 (ICD-10-CM) - AVM (arteriovenous malformation) brain  THERAPY DIAG:  Unsteadiness on feet  Muscle weakness (generalized)  Other lack of coordination  Visuospatial deficit  Rationale for Evaluation and Treatment: Rehabilitation  SUBJECTIVE:   SUBJECTIVE STATEMENT: Pt  states that he is mentally fatigued after PT session. Pt accompanied by: self and significant other  PERTINENT HISTORY:  Pt is s/p suboccipital craniotomy for resection of cerebellar AVM. PMH: RA, anxiety, CAD PSH: L TKA  PRECAUTIONS: Other: no bending, twisting, lifting > 10#  WEIGHT BEARING RESTRICTIONS:  no lifting > 10#  PAIN:  Are you having pain? Yes: NPRS scale: 8/10 Pain location: headache Pain description: nonstop, dull/nagging but can become sharp/shooting Aggravating factors: movement, lights Relieving factors: nothing, however has been using ice packs to alleviate  FALLS: Has patient fallen in last 6 months? No  LIVING ENVIRONMENT: Lives with: lives with their family (wife, and 56 and 21 yo daughters) Lives in: House/apartment Stairs: Yes: Internal: full flight of steps to home office, however is currently not having to navigate steps; on right going up and External: 3 steps; on right going up Has following equipment at home: Dan Humphreys - 2 wheeled and shower chair  PLOF: Independent, Independent with basic ADLs, Independent with household mobility without device, and Vocation/Vocational requirements: working as a Production designer, theatre/television/film in [employer], requires occasional travel by plane  PATIENT GOALS: to be able to move without having to use a walker  OBJECTIVE:   HAND DOMINANCE: Right  ADLs: Transfers/ambulation related to ADLs: Min A with RW Eating: wife will assist with cutting up foods, opening items, and occasionally placing items in his hand Grooming: Supervision when standing at sink UB Dressing: Min assist LB Dressing: Mod-max assist Toileting: wife will assist with pulling up pants/hygiene, min assist for transfers off toilet Bathing: supervision/cues for bathing, wife completes 5% Tub Shower transfers: Mod-max assist transfers in/out of shower Equipment: Shower seat without back, Walk in shower, Long handled shoe horn, and Long handled sponge   MOBILITY STATUS:  Needs Assist: Min A with RW  ACTIVITY TOLERANCE: Activity tolerance: diminished  FUNCTIONAL OUTCOME MEASURES: Myrtis Ser Index of Independence in ADLs:2/6  COORDINATION: Finger Nose Finger test: overshooting mildly with LUE 9 Hole Peg test: Right: 36.38 sec; Left: 1:00.87 sec Box and Blocks:  Right 41 blocks, Left 35 blocks  COGNITION: Overall cognitive status: Difficulty to assess due to: flat affect and decreased engagement in tasks s/p surgery Pt reports that he has been avoiding his phone, television, and bill paying, anything that makes him think. Spouse reports he is on a lot of medications which may also be impacting his arousal/cognition.    VISION ASSESSMENT: To be further assessed in functional context Ocular ROM: WFL Tracking/Visual pursuits: Able to track stimulus in all quads without difficulty Saccades: additional eye shifts occurred during testing, decreased speed of saccadic movements, and additional eye shifts in B eyes when looking to L  Patient has difficulty with following activities due to following visual impairments: is currently not reading, looking at phone or tv  OBSERVATIONS: flat affect, closing eyes frequently   TODAY'S TREATMENT:  04/24/22 Large grip pegs: pattern replication with focus on visual scanning and sequencing as well as use of LUE.  Pt knocking pegs off table due to motor apraxia. Pt demonstrating good motor control with large grip pegs with placement and removal from resistive peg board. Word search: Attempted to engage in word search with focus on visual scanning and saccadic movements.  Pt able to complete 50% with increased time, however reports increasing headache and wishing to terminate task. Toilet transfers: wife asking for assistance with problem solving toilet transfers.  Pt's toilet is in a water closet therefore difficult spacing for access and for assistance.  Discussed hand  placement on RW and toilet seat (if possible) to assist in lowering self down to toilet.  OT providing verbal cues and demonstration for spouse positioning as well to aid in assisting pt with lowering him and lifting from toilet (as needed).  Discussed body mechanics for increased ease and to decrease risk of injuring back.  Pt's wife practiced with therapist, as pt not feeling up to additional movements at this time.   PATIENT EDUCATION: Education details: educated on paper/pen puzzles for vision and body mechanics and positioning for toilet transfers. Person educated: Patient and Spouse Education method: Explanation Education comprehension: verbalized understanding and needs further education  HOME EXERCISE PROGRAM: TBD   GOALS: Goals reviewed with patient? Yes  SHORT TERM GOALS: Target date: 05/23/22  Pt will be independent in HEP for fine and gross motor control. Baseline: Goal status: IN PROGRESS  2.  Pt will verbalize understanding of adaptive strategies, task modifications, and/or potential A/E needs to increase ease, safety, and independence w/ ADLs. Baseline:  Goal status: IN PROGRESS  3.  Pt will demonstrate improved ease with sit > stand to get up from toilet or other seating options in home by improving score on sit > stand to decreased fall risk.  Baseline: time TBD Goal status: IN PROGRESS  4.  Pt will perform dynamic standing task for 5 mins as needed for simple IADLs w/o LOB using DME and/or countertop support prn   Baseline:  Goal status: IN PROGRESS  5.  Pt will demonstrate improved fine motor coordination for ADLs as evidenced by decreasing 9 hole peg test score for LUE by 3 secs  Baseline:  Goal status: IN PROGRESS   LONG TERM GOALS: Target date: 06/20/22  Pt will demonstrate shower transfers with DME PRN at supervision level. Baseline: currently sponge bathing due to fearfulness of showering, but wants to return to showers.   Goal status: IN PROGRESS    Pt will report increased participation in IADLs (such as cooking) to increase independence with functional IADLs.  Baseline:  Goal status: IN PROGRESS   Pt will complete UB dressing, to include clothing fasteners, at Mod I level with use of AE and/or alternative strategies PRN. Baseline:  Goal status: IN PROGRESS   Pt will complete LB dressing with Supervision/setup with use of AE and/or alternative strategies PRN. Baseline:  Goal status: IN PROGRESS   Pt will demonstrate improved fine motor coordination for ADLs as evidenced by decreasing 9 hole peg test score for LUE by 10 secs to increase participation in medication management. Baseline: Goal status: IN PROGRESS  ASSESSMENT:  CLINICAL IMPRESSION: Pt continues to be limited by decreased activity tolerance, balance, and headache.  Pt with minimal tolerance for visual scanning tasks and simulated bathroom transfers this session.  Wife reports that pt had a near fall in the bathroom, therefore wanted to address safe  bathroom transfers.  OT and wife practiced and wife returned demonstration even providing appropriate cues and positioning to aid pt in sit > stand from therapy mat.  PERFORMANCE DEFICITS: in functional skills including ADLs, IADLs, coordination, pain, Fine motor control, Gross motor control, balance, body mechanics, endurance, decreased knowledge of precautions, decreased knowledge of use of DME, vision, and UE functional use, cognitive skills including energy/drive, problem solving, and thought, and psychosocial skills including environmental adaptation, habits, and routines and behaviors.   IMPAIRMENTS: are limiting patient from ADLs and IADLs.   CO-MORBIDITIES: may have co-morbidities  that affects occupational performance. Patient will benefit from skilled OT to address above impairments and improve overall function.  MODIFICATION OR ASSISTANCE TO COMPLETE EVALUATION: Min-Moderate modification of tasks or assist with  assess necessary to complete an evaluation.  OT OCCUPATIONAL PROFILE AND HISTORY: Detailed assessment: Review of records and additional review of physical, cognitive, psychosocial history related to current functional performance.  CLINICAL DECISION MAKING: Moderate - several treatment options, min-mod task modification necessary  REHAB POTENTIAL: Good  EVALUATION COMPLEXITY: Moderate    PLAN:  OT FREQUENCY: 2x/week  OT DURATION: 8 weeks  PLANNED INTERVENTIONS: self care/ADL training, therapeutic exercise, therapeutic activity, neuromuscular re-education, balance training, functional mobility training, compression bandaging, moist heat, cryotherapy, patient/family education, cognitive remediation/compensation, visual/perceptual remediation/compensation, psychosocial skills training, energy conservation, coping strategies training, and DME and/or AE instructions  RECOMMENDED OTHER SERVICES: NA  CONSULTED AND AGREED WITH PLAN OF CARE: Patient and family member/caregiver  PLAN FOR NEXT SESSION: Initiate vision exercises (for saccades), educate on AE/DME for LB bathing/dressing, functional transfers for shower transfers   [place], [facility], OTR/L 04/24/2022, 12:50 PM

## 2022-04-29 DIAGNOSIS — Q282 Arteriovenous malformation of cerebral vessels: Secondary | ICD-10-CM | POA: Diagnosis not present

## 2022-04-29 NOTE — Therapy (Signed)
OUTPATIENT PHYSICAL THERAPY NEURO TREATMENT   Patient Name: Christopher Yu MRN: [id number] DOB:01-02-1961, 61 y.o., male Today's Date: 04/30/2022   PCP: Kirby Funk, MD  REFERRING PROVIDER: Lisbeth Renshaw, MD  END OF SESSION:  PT End of Session - 04/30/22 1306     Visit Number 3    Number of Visits 17    Date for PT Re-Evaluation 06/17/22    Authorization Type [employer]    Authorization - Visit Number 3    Authorization - Number of Visits 15   30 PT and OT combined   PT Start Time 1230    PT Stop Time 1304    PT Time Calculation (min) 34 min    Equipment Utilized During Treatment Gait belt    Activity Tolerance Patient limited by fatigue    Behavior During Therapy Flat affect               Past Medical History:  Diagnosis Date   Anxiety    Arthritis, rheumatoid (HCC)    dx 1988   CAD (coronary artery disease), native coronary artery    Mild LAD disease with calcification noted at cath 12//27/16    Cardiomyopathy [facility])    Chronic systolic heart failure (HCC) 05/01/2015   Depression    Deviated nasal septum 06/25/2011   Headache    Hyperlipidemia    Impingement syndrome of left shoulder 12/22/2017   Rheumatoid aortitis    Sleep apnea    does not use cpap - UNABLE TO TOLERATE MASK   Sleep apnea in adult    deviated septum repaired, most recent sleep study was negative   Status post total right knee replacement 06/01/2015   Past Surgical History:  Procedure Laterality Date   ANKLE FUSION Right 2014   related to his arthritis   ANKLE FUSION Left 05/25/13   related to his arthritis   APPLICATION OF CRANIAL NAVIGATION N/A 04/11/2022   Procedure: APPLICATION OF CRANIAL NAVIGATION;  Surgeon: Lisbeth Renshaw, MD;  Location: [facility] OR;  Service: Neurosurgery;  Laterality: N/A;   CARDIAC CATHETERIZATION N/A 05/01/2015   Procedure: Left Heart Cath and Coronary Angiography;  Surgeon: Lyn Records, MD;  Location: [facility] INVASIVE CV LAB;  Service:  Cardiovascular;  Laterality: N/A;   CRANIOTOMY N/A 04/11/2022   Procedure: STEREOTACTIC SUBOCCIPITAL CRANIOTOMY FOR RESECTION OF ARTERIO-VENOUS MALFORMATION;  Surgeon: Lisbeth Renshaw, MD;  Location: [facility] OR;  Service: Neurosurgery;  Laterality: N/A;   FRACTURE SURGERY     left femur fracture x 3   IR ANGIO EXTERNAL CAROTID SEL EXT CAROTID BILAT MOD SED  01/28/2022   IR ANGIO INTRA EXTRACRAN SEL INTERNAL CAROTID BILAT MOD SED  01/28/2022   IR ANGIO INTRA EXTRACRAN SEL INTERNAL CAROTID BILAT MOD SED  04/16/2022   IR ANGIO VERTEBRAL SEL VERTEBRAL BILAT MOD SED  01/28/2022   IR ANGIO VERTEBRAL SEL VERTEBRAL BILAT MOD SED  04/16/2022   KNEE ARTHROSCOPY     right x 4   NASAL SEPTOPLASTY W/ TURBINOPLASTY  06/25/2011   Procedure: NASAL SEPTOPLASTY WITH TURBINATE REDUCTION;  Surgeon: Osborn Coho, MD;  Location: [facility] OR;  Service: ENT;  Laterality: Bilateral;   TONSILLECTOMY     TOTAL KNEE ARTHROPLASTY Right 06/01/2015   Procedure: RIGHT TOTAL KNEE ARTHROPLASTY;  Surgeon: Kathryne Hitch, MD;  Location: [facility] ORS;  Service: Orthopedics;  Laterality: Right;  Block+general   Patient Active Problem List   Diagnosis Date Noted   Status post craniotomy 04/11/2022   AVM (arteriovenous malformation) brain 04/11/2022  Impingement syndrome of left shoulder 12/22/2017   Impingement syndrome of right shoulder 12/22/2017   Rheumatoid arthritis involving right knee ([place]) 06/01/2015   Status post total right knee replacement 06/01/2015   Cardiomyopathy ([place]) 05/02/2015   Hyperlipidemia    Sleep apnea in adult    Rheumatoid arthritis ([place])    Chronic systolic heart failure ([place]) 05/01/2015   CAD (coronary artery disease), native coronary artery     ONSET DATE: 04/11/22  REFERRING DIAG: [id number] (ICD-10-CM) - Status post craniotomy Q28.2 (ICD-10-CM) - AVM (arteriovenous malformation) brain  THERAPY DIAG:  Unsteadiness on feet  Dizziness and giddiness  Muscle weakness (generalized)  Other  abnormalities of gait and mobility  Rationale for Evaluation and Treatment: Rehabilitation  SUBJECTIVE:                                                                                                                                                                                             SUBJECTIVE STATEMENT: Wife reports that patient had a CT scan yesterday. Still unsteady but seems like he is able to recover a little better. Wife reports that pt has not doing his HEP- does not want to. Patient reports increased HA over the last couple of days- reports that this is why he is not doing his HEP.   Pt accompanied by: significant other  PERTINENT HISTORY: Anxiety, RA, CAD, cardiomyopathy, chronic systolic failure, depression, HA, HLD, R TKA 2017, B ankle fusion 2014/2015, L femur fx surgery x3, craniotomy with resection of AVM 04/11/22  PAIN:  Are you having pain? Yes: NPRS scale: 7/10 Pain location: entire back of the head Pain description: spasm down the incision line Aggravating factors: movement, light, noise Relieving factors: trying to rest, dark, quiet room  PRECAUTIONS: Other: per hospital DC note: No lifting >10 lbs, No bending/twisting at waist   WEIGHT BEARING RESTRICTIONS: No  FALLS: Has patient fallen in last 6 months? No  LIVING ENVIRONMENT: Lives with: lives with their spouse Lives in: House/apartment Stairs:  2 story house with 3 steps to enter with handrail on R Has following equipment at home: Environmental consultant - 2 wheeled and Shower bench  PLOF: Independent; [place] in home office which is on 2nd floor; golf  PATIENT GOALS: improve stability   OBJECTIVE:    TODAY'S TREATMENT: 04/30/22 Activity Comments  STS pushing off knees 10x  Cueing to scoot forward, encouraged "nose over toes"  Standing balance hip width 2x30" Romberg 2x30" Standing wide with EC 2x30" Cues to shift slightly onto L LE and avoid L knee recurvatum; occasional cues to shift forward onto toes   At  walker: Alt march  10x  Limited by fatigue; excessive posterior sway     PATIENT EDUCATION: Education details: discussion with patient and wife about ongoing head pain and current functional status; discussed possibility of HHPT d/t patient's limited endurance and wife's report of difficulty navigating at home Person educated: Patient and Spouse Education method: Explanation Education comprehension: verbalized understanding   HOME EXERCISE PROGRAM Last updated: 04/24/22 Access Code: [email or web address] URL: [email or web address] Date: 04/29/2022 Prepared by: [facility]  Program Notes perform at a counter top and with gait belt with wife's supervision!  Exercises - Sit to Stand with Counter Support  - 1 x daily - 5 x weekly - 2 sets - 10 reps - Wide Stance with Counter Support  - 1 x daily - 5 x weekly - 2 sets - 10 reps - Side to Side Weight Shift with Counter Support  - 1 x daily - 5 x weekly - 2 sets - 10 reps - Forward Backward Weight Shift with Counter Support  - 1 x daily - 5 x weekly - 2 sets - 10 reps    Below measures were taken at time of initial evaluation unless otherwise specified:  DIAGNOSTIC FINDINGS: CEREBRAL ANGIOGRAM 04/16/22: Complete resection of previously identified left cerebellar arteriovenous malformation, without residual nidus or early venous drainage seen.  COGNITION: Overall cognitive status:  pt notes "trying very little to think"   SENSATION: WFL  COORDINATION: Alternating pronation/supination: dysmetria on L Alternating toe tap: intact B Finger to nose: L mild-mod dysmetria and bradykinesia   MUSCLE TONE: intact B   POSTURE: No Significant postural limitations  OBSERVATION: suboccipital incision well-healing and without red flag signs  PALPATION: no TTP in B shoulders  LOWER EXTREMITY ROM:     Active  Right Eval Left Eval  Hip flexion    Hip extension    Hip abduction    Hip  adduction    Hip internal rotation    Hip external rotation    Knee flexion    Knee extension    Ankle dorsiflexion 4 -3  Ankle plantarflexion    Ankle inversion    Ankle eversion     (Blank rows = not tested)  LOWER EXTREMITY MMT:    MMT (in sitting) Right Eval Left Eval  Hip flexion 4+ 4  Hip extension    Hip abduction 4+ 4+  Hip adduction 4+ 4+  Hip internal rotation    Hip external rotation    Knee flexion 4 4  Knee extension 4 4+  Ankle dorsiflexion 4+ 4+  Ankle plantarflexion 4+ 4+  Ankle inversion    Ankle eversion    (Blank rows = not tested)  VESTIBULAR ASSESSMENT   GENERAL OBSERVATION: pt wears bifocals most of the time     OCULOMOTOR EXAM:   Ocular Alignment:  L eye slightly elevated in orbit   Ocular ROM: No Limitations   Spontaneous Nystagmus: absent   Gaze-Induced Nystagmus: absent   Smooth Pursuits:  2-3 saccades in each direction   Saccades:  poor trajectory L, overshooting R, slow vertical   Convergence/Divergence: 1.5 ft before diplopia    VESTIBULAR - OCULAR REFLEX:    Slow VOR: Normal performed within tolerable cervical ROM   VOR Cancellation: Corrective Saccades B   Head-Impulse Test: NT     M-CTSIB  Condition 1: Firm Surface, EO 10 Sec, Severe and inability to self-correct, required mod A and walker support to steady self    Condition 2: Firm Surface,  EC NT Sec,  NT  Sway  Condition 3: Foam Surface, EO NT Sec,  NT  Sway  Condition 4: Foam Surface, EC NT Sec,  NT  Sway      GAIT: Gait pattern:  B short steps with poor safety awareness Assistive device utilized: Walker - 2 wheeled Level of assistance: CGA     TODAY'S TREATMENT:                                                                                                                              DATE: 04/22/22    PATIENT EDUCATION: Education details: prognosis, POC, edu on exam findings, encouraged use of gait belt for safety at home Person educated: Patient and  Spouse Education method: Explanation, Demonstration, Tactile cues, and Verbal cues Education comprehension: verbalized understanding  HOME EXERCISE PROGRAM: Not yet initiated    GOALS: Goals reviewed with patient? Yes  SHORT TERM GOALS: Target date: 05/13/2022  Patient to be independent with initial HEP. Baseline: HEP initiated Goal status: IN PROGRESS    LONG TERM GOALS: Target date: 06/17/2022  Patient to be independent with advanced HEP. Baseline: Not yet initiated  Goal status: IN PROGRESS  Patient to demonstrate B LE strength >/=4+/5.  Baseline: See above Goal status: IN PROGRESS  Patient to report 75% improvement in HAs.  Baseline: - Goal status: IN PROGRESS  Patient to no dizziness with transfers or bed mobility.  Baseline: dizziness Goal status: IN PROGRESS  Patient to complete TUG in <14 sec with LRAD in order to decrease risk of falls.   Baseline: 31.36 sec with RW Goal status: IN PROGRESS  Patient to demonstrate 5xSTS test in <15 sec in order to decrease risk of falls.  Baseline: 31.18 sec with B armrests Goal status: IN PROGRESS  Patient to score at least 40/56 on Berg in order to decrease risk of falls.  Baseline: 23/56 Goal status: IN PROGRESS   ASSESSMENT:  CLINICAL IMPRESSION: Patient arrived to session with wife with report of worsening headache and notes that the patient underwent head CT yesterday. Patient reports HEP noncompliance d/t this pain. D/t patient's c/o photophobia, session was performed in dim lighting. Worked on review of STS transfers with intermittent cueing for proper form and safety. Patient still requires cueing for safe transfers with walker occasionally. Static balance appeared to be improved today, however with very limited endurance for standing activities today. Patient requested to end session short and upon discussion, patient and spouse request transition to HHPT/OT d/t limited endurance and some mention of difficulty  navigating in the home.    OBJECTIVE IMPAIRMENTS: Abnormal gait, decreased balance, decreased coordination, decreased knowledge of use of DME, difficulty walking, decreased ROM, decreased strength, dizziness, and pain.   ACTIVITY LIMITATIONS: carrying, lifting, bending, sitting, standing, squatting, stairs, transfers, bed mobility, bathing, toileting, dressing, reach over head, and hygiene/grooming  PARTICIPATION LIMITATIONS: meal prep, cleaning, laundry, driving, shopping, community activity, occupation, yard work, and church  PERSONAL  FACTORS: Age, Past/current experiences, Time since onset of injury/illness/exacerbation, and 3+ comorbidities: Anxiety, RA, CAD, cardiomyopathy, chronic systolic failure, depression, HA, HLD, R TKA 2017, B ankle fusion 2014/2015, L femur fx surgery x3, craniotomy with resection of AVM 04/11/22  are also affecting patient's functional outcome.   REHAB POTENTIAL: Good  CLINICAL DECISION MAKING: Evolving/moderate complexity  EVALUATION COMPLEXITY: Moderate  PLAN:  PT FREQUENCY: 2x/week  PT DURATION: 8 weeks  PLANNED INTERVENTIONS: Therapeutic exercises, Therapeutic activity, Neuromuscular re-education, Balance training, Gait training, Patient/Family education, Self Care, Joint mobilization, Stair training, Vestibular training, Canalith repositioning, DME instructions, Aquatic Therapy, Dry Needling, Electrical stimulation, Cryotherapy, Moist heat, Taping, Manual therapy, and Re-evaluation  PLAN FOR NEXT SESSION: 30 day hold vs. DC at this time   Janene Harvey, PT, DPT 04/30/22 1:07 PM  [facility] [address] [place], [place] [address] Phone # [phone] Fax # [phone]

## 2022-04-30 ENCOUNTER — Ambulatory Visit: Payer: BC Managed Care – PPO | Admitting: Occupational Therapy

## 2022-04-30 ENCOUNTER — Encounter: Payer: Self-pay | Admitting: Physical Therapy

## 2022-04-30 ENCOUNTER — Ambulatory Visit: Payer: BC Managed Care – PPO | Admitting: Physical Therapy

## 2022-04-30 DIAGNOSIS — R2681 Unsteadiness on feet: Secondary | ICD-10-CM

## 2022-04-30 DIAGNOSIS — M6281 Muscle weakness (generalized): Secondary | ICD-10-CM

## 2022-04-30 DIAGNOSIS — R2689 Other abnormalities of gait and mobility: Secondary | ICD-10-CM | POA: Diagnosis not present

## 2022-04-30 DIAGNOSIS — R42 Dizziness and giddiness: Secondary | ICD-10-CM

## 2022-04-30 DIAGNOSIS — R278 Other lack of coordination: Secondary | ICD-10-CM | POA: Diagnosis not present

## 2022-04-30 DIAGNOSIS — R41842 Visuospatial deficit: Secondary | ICD-10-CM | POA: Diagnosis not present

## 2022-04-30 NOTE — Therapy (Signed)
[place] [facility] [address], [address] [place], [place], [address] Phone: [phone]   Fax:  [phone]  Patient Details  Name: Christopher Yu MRN: [id number] Date of Birth: 1960-08-01 Referring Provider:  Kirby Funk, MD  Encounter Date: 04/30/2022  Pt arrived for PT session prior to scheduled OT session.  PT session terminated early and pt, spouse, PT discussed pursuing home health services, due to poor tolerance.  Pt left prior to OT session.  Rosalio Loud, OT 04/30/2022, 1:08 PM  [place] [facility] [address] [place], [place], [address] Phone: [phone]   Fax:  [phone]

## 2022-05-01 DIAGNOSIS — Q282 Arteriovenous malformation of cerebral vessels: Secondary | ICD-10-CM | POA: Diagnosis not present

## 2022-05-06 ENCOUNTER — Ambulatory Visit: Payer: BC Managed Care – PPO | Attending: Neurosurgery | Admitting: Occupational Therapy

## 2022-05-06 DIAGNOSIS — R42 Dizziness and giddiness: Secondary | ICD-10-CM | POA: Insufficient documentation

## 2022-05-06 DIAGNOSIS — R262 Difficulty in walking, not elsewhere classified: Secondary | ICD-10-CM | POA: Diagnosis not present

## 2022-05-06 DIAGNOSIS — R41842 Visuospatial deficit: Secondary | ICD-10-CM | POA: Diagnosis not present

## 2022-05-06 DIAGNOSIS — R2681 Unsteadiness on feet: Secondary | ICD-10-CM | POA: Diagnosis not present

## 2022-05-06 DIAGNOSIS — M5459 Other low back pain: Secondary | ICD-10-CM | POA: Insufficient documentation

## 2022-05-06 DIAGNOSIS — M6281 Muscle weakness (generalized): Secondary | ICD-10-CM | POA: Insufficient documentation

## 2022-05-06 DIAGNOSIS — R278 Other lack of coordination: Secondary | ICD-10-CM | POA: Insufficient documentation

## 2022-05-06 DIAGNOSIS — R2689 Other abnormalities of gait and mobility: Secondary | ICD-10-CM | POA: Diagnosis not present

## 2022-05-06 NOTE — Therapy (Signed)
OUTPATIENT OCCUPATIONAL THERAPY  Treatment Note  Patient Name: Christopher Yu MRN: [id number] DOB:1961-04-25, 62 y.o., male Today's Date: 05/06/2022  PCP: Kirby Funk, MD REFERRING PROVIDER: Lisbeth Renshaw, MD  END OF SESSION:  OT End of Session - 05/06/22 0955     Visit Number 3    Number of Visits 17    Date for OT Re-Evaluation 06/20/22    Authorization Type [employer]    OT Start Time 0933    OT Stop Time 1015    OT Time Calculation (min) 42 min    Activity Tolerance Patient tolerated treatment well    Behavior During Therapy [facility] for tasks assessed/performed;Flat affect               Past Medical History:  Diagnosis Date   Anxiety    Arthritis, rheumatoid (HCC)    dx 1988   CAD (coronary artery disease), native coronary artery    Mild LAD disease with calcification noted at cath 12//27/16    Cardiomyopathy [facility])    Chronic systolic heart failure (HCC) 05/01/2015   Depression    Deviated nasal septum 06/25/2011   Headache    Hyperlipidemia    Impingement syndrome of left shoulder 12/22/2017   Rheumatoid aortitis    Sleep apnea    does not use cpap - UNABLE TO TOLERATE MASK   Sleep apnea in adult    deviated septum repaired, most recent sleep study was negative   Status post total right knee replacement 06/01/2015   Past Surgical History:  Procedure Laterality Date   ANKLE FUSION Right 2014   related to his arthritis   ANKLE FUSION Left 05/25/13   related to his arthritis   APPLICATION OF CRANIAL NAVIGATION N/A 04/11/2022   Procedure: APPLICATION OF CRANIAL NAVIGATION;  Surgeon: Lisbeth Renshaw, MD;  Location: [facility] OR;  Service: Neurosurgery;  Laterality: N/A;   CARDIAC CATHETERIZATION N/A 05/01/2015   Procedure: Left Heart Cath and Coronary Angiography;  Surgeon: Lyn Records, MD;  Location: [facility] INVASIVE CV LAB;  Service: Cardiovascular;  Laterality: N/A;   CRANIOTOMY N/A 04/11/2022   Procedure: STEREOTACTIC SUBOCCIPITAL CRANIOTOMY FOR  RESECTION OF ARTERIO-VENOUS MALFORMATION;  Surgeon: Lisbeth Renshaw, MD;  Location: [facility] OR;  Service: Neurosurgery;  Laterality: N/A;   FRACTURE SURGERY     left femur fracture x 3   IR ANGIO EXTERNAL CAROTID SEL EXT CAROTID BILAT MOD SED  01/28/2022   IR ANGIO INTRA EXTRACRAN SEL INTERNAL CAROTID BILAT MOD SED  01/28/2022   IR ANGIO INTRA EXTRACRAN SEL INTERNAL CAROTID BILAT MOD SED  04/16/2022   IR ANGIO VERTEBRAL SEL VERTEBRAL BILAT MOD SED  01/28/2022   IR ANGIO VERTEBRAL SEL VERTEBRAL BILAT MOD SED  04/16/2022   KNEE ARTHROSCOPY     right x 4   NASAL SEPTOPLASTY W/ TURBINOPLASTY  06/25/2011   Procedure: NASAL SEPTOPLASTY WITH TURBINATE REDUCTION;  Surgeon: Osborn Coho, MD;  Location: [facility] OR;  Service: ENT;  Laterality: Bilateral;   TONSILLECTOMY     TOTAL KNEE ARTHROPLASTY Right 06/01/2015   Procedure: RIGHT TOTAL KNEE ARTHROPLASTY;  Surgeon: Kathryne Hitch, MD;  Location: [facility] ORS;  Service: Orthopedics;  Laterality: Right;  Block+general   Patient Active Problem List   Diagnosis Date Noted   Status post craniotomy 04/11/2022   AVM (arteriovenous malformation) brain 04/11/2022   Impingement syndrome of left shoulder 12/22/2017   Impingement syndrome of right shoulder 12/22/2017   Rheumatoid arthritis involving right knee (HCC) 06/01/2015   Status post total right knee replacement  06/01/2015   Cardiomyopathy ([place]) 05/02/2015   Hyperlipidemia    Sleep apnea in adult    Rheumatoid arthritis ([place])    Chronic systolic heart failure ([place]) 05/01/2015   CAD (coronary artery disease), native coronary artery     ONSET DATE: 04/11/22  REFERRING DIAG: M42.683 (ICD-10-CM) - Status post craniotomy Q28.2 (ICD-10-CM) - AVM (arteriovenous malformation) brain  THERAPY DIAG:  Unsteadiness on feet  Muscle weakness (generalized)  Other abnormalities of gait and mobility  Other lack of coordination  Visuospatial deficit  Rationale for Evaluation and Treatment:  Rehabilitation  SUBJECTIVE:   SUBJECTIVE STATEMENT: Pt reports things are going "slowly." Pt accompanied by: self and significant other  PERTINENT HISTORY:  Pt is s/p suboccipital craniotomy for resection of cerebellar AVM. PMH: RA, anxiety, CAD PSH: L TKA  PRECAUTIONS: Other: no bending, twisting, lifting > 10#  WEIGHT BEARING RESTRICTIONS:  no lifting > 10#  PAIN:  Are you having pain? Yes: NPRS scale: 6/10 Pain location: headache Pain description: nonstop, dull/nagging but can become sharp/shooting Aggravating factors: movement, lights Relieving factors: nothing, however has been using ice packs to alleviate  FALLS: Has patient fallen in last 6 months? No  LIVING ENVIRONMENT: Lives with: lives with their family (wife, and 25 and 63 yo daughters) Lives in: House/apartment Stairs: Yes: Internal: full flight of steps to home office, however is currently not having to navigate steps; on right going up and External: 3 steps; on right going up Has following equipment at home: Gilford Rile - 2 wheeled and shower chair  PLOF: Independent, Independent with basic ADLs, Independent with household mobility without device, and Vocation/Vocational requirements: working as a Freight forwarder in [employer], requires occasional travel by plane  PATIENT GOALS: to be able to move without having to use a walker  OBJECTIVE:   HAND DOMINANCE: Right  ADLs: Transfers/ambulation related to ADLs: Min A with RW Eating: wife will assist with cutting up foods, opening items, and occasionally placing items in his hand Grooming: Supervision when standing at sink UB Dressing: Min assist LB Dressing: Mod-max assist Toileting: wife will assist with pulling up pants/hygiene, min assist for transfers off toilet Bathing: supervision/cues for bathing, wife completes 5% Tub Shower transfers: Mod-max assist transfers in/out of shower Equipment: Shower seat without back, Walk in shower, Long handled shoe horn, and  Long handled sponge   MOBILITY STATUS: Needs Assist: Min A with RW  ACTIVITY TOLERANCE: Activity tolerance: diminished  FUNCTIONAL OUTCOME MEASURES: Ron Parker Index of Independence in ADLs:2/6  COORDINATION: Finger Nose Finger test: overshooting mildly with LUE 9 Hole Peg test: Right: 36.38 sec; Left: 1:00.87 sec Box and Blocks:  Right 41 blocks, Left 35 blocks  COGNITION: Overall cognitive status: Difficulty to assess due to: flat affect and decreased engagement in tasks s/p surgery Pt reports that he has been avoiding his phone, television, and bill paying, anything that makes him think. Spouse reports he is on a lot of medications which may also be impacting his arousal/cognition.    VISION ASSESSMENT: To be further assessed in functional context Ocular ROM: WFL Tracking/Visual pursuits: Able to track stimulus in all quads without difficulty Saccades: additional eye shifts occurred during testing, decreased speed of saccadic movements, and additional eye shifts in B eyes when looking to L  Patient has difficulty with following activities due to following visual impairments: is currently not reading, looking at phone or tv  OBSERVATIONS: flat affect, closing eyes frequently   TODAY'S TREATMENT:  05/06/22 IADL: pt reports attempting to work on his laptop, but finding difficulty with entering passwords and when writing emails.  Discussed cognitive, visual, as well as coordination component with computer work. Bathroom transfers: pt and spouse report quality of mobility is improving, but not quantity.  Spouse reports that pt is getting in/out of bathroom water closet with improved ease.  Spouse reports that shower transfers are still challenging as the shower is quite small and the seat is sometimes unsteady due to slope of floor.  Discussed adjusting seat legs to improve stability as able. Coordination: placing large grip pegs  with LUE with focus on motor control.  Pt with mod overshooting when placing pegs.  OT increased challenge to placing stones on top of pegs starting with one at a time and progression to in-hand manipulation with stones.  Pt with min-mod difficulty dropping 40% with in-hand manipulation. Dynamic standing: Pt tolerated standing 3 mins while completing small peg board pattern replication.  Pt demonstrating significant slowing of coordination and cognition when completing peg board pattern in standing.  OT educated on not "overloading the system" by increasing challenge too many steps.  Discussed functional carryover to home with ADLs and IADLs.   04/24/22 Large grip pegs: pattern replication with focus on visual scanning and sequencing as well as use of LUE.  Pt knocking pegs off table due to motor apraxia. Pt demonstrating good motor control with large grip pegs with placement and removal from resistive peg board. Word search: Attempted to engage in word search with focus on visual scanning and saccadic movements.  Pt able to complete 50% with increased time, however reports increasing headache and wishing to terminate task. Toilet transfers: wife asking for assistance with problem solving toilet transfers.  Pt's toilet is in a water closet therefore difficult spacing for access and for assistance.  Discussed hand placement on RW and toilet seat (if possible) to assist in lowering self down to toilet.  OT providing verbal cues and demonstration for spouse positioning as well to aid in assisting pt with lowering him and lifting from toilet (as needed).  Discussed body mechanics for increased ease and to decrease risk of injuring back.  Pt's wife practiced with therapist, as pt not feeling up to additional movements at this time.   PATIENT EDUCATION: Education details: educated on paper/pen puzzles for vision and body mechanics and positioning for toilet transfers. Person educated: Patient and  Spouse Education method: Explanation Education comprehension: verbalized understanding and needs further education  HOME EXERCISE PROGRAM: TBD   GOALS: Goals reviewed with patient? Yes  SHORT TERM GOALS: Target date: 05/23/22  Pt will be independent in HEP for fine and gross motor control. Baseline: Goal status: IN PROGRESS  2.  Pt will verbalize understanding of adaptive strategies, task modifications, and/or potential A/E needs to increase ease, safety, and independence w/ ADLs. Baseline:  Goal status: IN PROGRESS  3.  Pt will demonstrate improved ease with sit > stand to get up from toilet or other seating options in home by improving score on sit > stand to decreased fall risk.  Baseline: time TBD Goal status: IN PROGRESS  4.  Pt will perform dynamic standing task for 5 mins as needed for simple IADLs w/o LOB using DME and/or countertop support prn   Baseline:  Goal status: IN PROGRESS  5.  Pt will demonstrate improved fine motor coordination for ADLs as evidenced by decreasing 9 hole peg test score for LUE by 3 secs  Baseline:9 [facility]  Peg test: Right: 36.38 sec; Left: 1:00.87 sec  Goal status: IN PROGRESS   LONG TERM GOALS: Target date: 06/20/22  Pt will demonstrate shower transfers with DME PRN at supervision level. Baseline: currently sponge bathing due to fearfulness of showering, but wants to return to showers.   Goal status: IN PROGRESS   Pt will report increased participation in IADLs (such as cooking) to increase independence with functional IADLs.  Baseline:  Goal status: IN PROGRESS   Pt will complete UB dressing, to include clothing fasteners, at Mod I level with use of AE and/or alternative strategies PRN. Baseline:  Goal status: IN PROGRESS   Pt will complete LB dressing with Supervision/setup with use of AE and/or alternative strategies PRN. Baseline:  Goal status: IN PROGRESS   Pt will demonstrate improved fine motor coordination for ADLs as  evidenced by decreasing 9 hole peg test score for LUE by 10 secs to increase participation in medication management. Baseline: Goal status: IN PROGRESS  ASSESSMENT:  CLINICAL IMPRESSION: Pt continues to be limited by decreased activity tolerance, balance, and headache.  OT treated pt in small treatment room with lights dimmed to allow for increased focus.  Pt completing gross and fine motor coordination tasks with min-mod difficulty with some overshooting and increased dropping of items with increased challenge.  OT attempted to incorporate dynamic standing and cognitive challenge, pt able to complete standing for 3 mins but demonstrating significant slowing in processing with increased demand.  OT educated on functional carryover to ADLs and IADLs with focus on small, manageable increases without "overloading the system".   PERFORMANCE DEFICITS: in functional skills including ADLs, IADLs, coordination, pain, Fine motor control, Gross motor control, balance, body mechanics, endurance, decreased knowledge of precautions, decreased knowledge of use of DME, vision, and UE functional use, cognitive skills including energy/drive, problem solving, and thought, and psychosocial skills including environmental adaptation, habits, and routines and behaviors.   IMPAIRMENTS: are limiting patient from ADLs and IADLs.   CO-MORBIDITIES: may have co-morbidities  that affects occupational performance. Patient will benefit from skilled OT to address above impairments and improve overall function.  MODIFICATION OR ASSISTANCE TO COMPLETE EVALUATION: Min-Moderate modification of tasks or assist with assess necessary to complete an evaluation.  OT OCCUPATIONAL PROFILE AND HISTORY: Detailed assessment: Review of records and additional review of physical, cognitive, psychosocial history related to current functional performance.  CLINICAL DECISION MAKING: Moderate - several treatment options, min-mod task modification  necessary  REHAB POTENTIAL: Good  EVALUATION COMPLEXITY: Moderate    PLAN:  OT FREQUENCY: 2x/week  OT DURATION: 8 weeks  PLANNED INTERVENTIONS: self care/ADL training, therapeutic exercise, therapeutic activity, neuromuscular re-education, balance training, functional mobility training, compression bandaging, moist heat, cryotherapy, patient/family education, cognitive remediation/compensation, visual/perceptual remediation/compensation, psychosocial skills training, energy conservation, coping strategies training, and DME and/or AE instructions  RECOMMENDED OTHER SERVICES: NA  CONSULTED AND AGREED WITH PLAN OF CARE: Patient and family member/caregiver  PLAN FOR NEXT SESSION: Initiate vision exercises (for saccades), educate on AE/DME for LB bathing/dressing, functional transfers for shower transfers, coordination, dynamic standing   Masiah Woody, [place], OTR/L 05/06/2022, 9:55 AM

## 2022-05-07 ENCOUNTER — Ambulatory Visit: Payer: BC Managed Care – PPO

## 2022-05-07 DIAGNOSIS — M6281 Muscle weakness (generalized): Secondary | ICD-10-CM

## 2022-05-07 DIAGNOSIS — R278 Other lack of coordination: Secondary | ICD-10-CM | POA: Diagnosis not present

## 2022-05-07 DIAGNOSIS — R2681 Unsteadiness on feet: Secondary | ICD-10-CM

## 2022-05-07 DIAGNOSIS — R42 Dizziness and giddiness: Secondary | ICD-10-CM

## 2022-05-07 DIAGNOSIS — R41842 Visuospatial deficit: Secondary | ICD-10-CM | POA: Diagnosis not present

## 2022-05-07 DIAGNOSIS — R262 Difficulty in walking, not elsewhere classified: Secondary | ICD-10-CM

## 2022-05-07 DIAGNOSIS — M5459 Other low back pain: Secondary | ICD-10-CM | POA: Diagnosis not present

## 2022-05-07 DIAGNOSIS — R2689 Other abnormalities of gait and mobility: Secondary | ICD-10-CM | POA: Diagnosis not present

## 2022-05-07 NOTE — Therapy (Signed)
OUTPATIENT PHYSICAL THERAPY NEURO TREATMENT   Patient Name: Christopher Yu MRN: [id number] DOB:[birth year], 62 y.o., male Today's Date: 05/07/2022   PCP: Kirby Funk, MD  REFERRING PROVIDER: Lisbeth Renshaw, MD  END OF SESSION:  PT End of Session - 05/07/22 1606     Visit Number 4    Number of Visits 17    Date for PT Re-Evaluation 06/17/22    Authorization Type [employer]    Authorization - Visit Number 4    Authorization - Number of Visits 15   30 PT and OT combined   PT Start Time 1615    PT Stop Time 1700    PT Time Calculation (min) 45 min    Equipment Utilized During Treatment Gait belt    Activity Tolerance Patient limited by fatigue    Behavior During Therapy Flat affect               Past Medical History:  Diagnosis Date   Anxiety    Arthritis, rheumatoid (HCC)    dx 1988   CAD (coronary artery disease), native coronary artery    Mild LAD disease with calcification noted at cath 12//27/16    Cardiomyopathy [facility])    Chronic systolic heart failure (HCC) 05/01/2015   Depression    Deviated nasal septum 06/25/2011   Headache    Hyperlipidemia    Impingement syndrome of left shoulder 12/22/2017   Rheumatoid aortitis    Sleep apnea    does not use cpap - UNABLE TO TOLERATE MASK   Sleep apnea in adult    deviated septum repaired, most recent sleep study was negative   Status post total right knee replacement 06/01/2015   Past Surgical History:  Procedure Laterality Date   ANKLE FUSION Right 2014   related to his arthritis   ANKLE FUSION Left 05/25/13   related to his arthritis   APPLICATION OF CRANIAL NAVIGATION N/A 04/11/2022   Procedure: APPLICATION OF CRANIAL NAVIGATION;  Surgeon: Lisbeth Renshaw, MD;  Location: [facility] OR;  Service: Neurosurgery;  Laterality: N/A;   CARDIAC CATHETERIZATION N/A 05/01/2015   Procedure: Left Heart Cath and Coronary Angiography;  Surgeon: Lyn Records, MD;  Location: [facility] INVASIVE CV LAB;  Service:  Cardiovascular;  Laterality: N/A;   CRANIOTOMY N/A 04/11/2022   Procedure: STEREOTACTIC SUBOCCIPITAL CRANIOTOMY FOR RESECTION OF ARTERIO-VENOUS MALFORMATION;  Surgeon: Lisbeth Renshaw, MD;  Location: [facility] OR;  Service: Neurosurgery;  Laterality: N/A;   FRACTURE SURGERY     left femur fracture x 3   IR ANGIO EXTERNAL CAROTID SEL EXT CAROTID BILAT MOD SED  01/28/2022   IR ANGIO INTRA EXTRACRAN SEL INTERNAL CAROTID BILAT MOD SED  01/28/2022   IR ANGIO INTRA EXTRACRAN SEL INTERNAL CAROTID BILAT MOD SED  04/16/2022   IR ANGIO VERTEBRAL SEL VERTEBRAL BILAT MOD SED  01/28/2022   IR ANGIO VERTEBRAL SEL VERTEBRAL BILAT MOD SED  04/16/2022   KNEE ARTHROSCOPY     right x 4   NASAL SEPTOPLASTY W/ TURBINOPLASTY  06/25/2011   Procedure: NASAL SEPTOPLASTY WITH TURBINATE REDUCTION;  Surgeon: Osborn Coho, MD;  Location: [facility] OR;  Service: ENT;  Laterality: Bilateral;   TONSILLECTOMY     TOTAL KNEE ARTHROPLASTY Right 06/01/2015   Procedure: RIGHT TOTAL KNEE ARTHROPLASTY;  Surgeon: Kathryne Hitch, MD;  Location: [facility] ORS;  Service: Orthopedics;  Laterality: Right;  Block+general   Patient Active Problem List   Diagnosis Date Noted   Status post craniotomy 04/11/2022   AVM (arteriovenous malformation) brain 04/11/2022  Impingement syndrome of left shoulder 12/22/2017   Impingement syndrome of right shoulder 12/22/2017   Rheumatoid arthritis involving right knee (HCC) 06/01/2015   Status post total right knee replacement 06/01/2015   Cardiomyopathy (HCC) 05/02/2015   Hyperlipidemia    Sleep apnea in adult    Rheumatoid arthritis (HCC)    Chronic systolic heart failure (HCC) 05/01/2015   CAD (coronary artery disease), native coronary artery     ONSET DATE: 04/11/22  REFERRING DIAG: S28.768 (ICD-10-CM) - Status post craniotomy Q28.2 (ICD-10-CM) - AVM (arteriovenous malformation) brain  THERAPY DIAG:  Unsteadiness on feet  Muscle weakness (generalized)  Other abnormalities of gait and  mobility  Dizziness and giddiness  Difficulty in walking, not elsewhere classified  Rationale for Evaluation and Treatment: Rehabilitation  SUBJECTIVE:                                                                                                                                                                                             SUBJECTIVE STATEMENT: Feeling fatigued busy day with MD appointments  Pt accompanied by: significant other  PERTINENT HISTORY: Anxiety, RA, CAD, cardiomyopathy, chronic systolic failure, depression, HA, HLD, R TKA 2017, B ankle fusion 2014/2015, L femur fx surgery x3, craniotomy with resection of AVM 04/11/22  PAIN:  Are you having pain? Yes: NPRS scale: "not as bad"/10 Pain location: entire back of the head Pain description: spasm down the incision line Aggravating factors: movement, light, noise Relieving factors: trying to rest, dark, quiet room  PRECAUTIONS: Other: per hospital DC note: No lifting >10 lbs, No bending/twisting at waist   WEIGHT BEARING RESTRICTIONS: No  FALLS: Has patient fallen in last 6 months? No  LIVING ENVIRONMENT: Lives with: lives with their spouse Lives in: House/apartment Stairs:  2 story house with 3 steps to enter with handrail on R Has following equipment at home: Environmental consultant - 2 wheeled and Shower bench  PLOF: Independent; WFH in home office which is on 2nd floor; golf  PATIENT GOALS: improve stability   OBJECTIVE:   TODAY'S TREATMENT: 05/07/22 Activity Comments  Seated warm-up -AP 30 reps -LAQ 20 reps  STS to alt march 10x W/ RW for UE support  Gait training  -W/ cane, cues for sequence and guarding due to unsteadiness. -w / RW and CGA  Sidestepping At counter x 60 sec  [facility] taps 1x3  Pt education -activity pacing, energy conservation, task segmentation  Vitals at end of session 106/82, 89 bpm     TODAY'S TREATMENT: 04/30/22 Activity Comments  STS pushing off knees 10x  Cueing to scoot forward,  encouraged "nose over toes"  Standing balance  hip width 2x30" Romberg 2x30" Standing wide with EC 2x30" Cues to shift slightly onto L LE and avoid L knee recurvatum; occasional cues to shift forward onto toes   At walker: Alt march 10x  Limited by fatigue; excessive posterior sway     PATIENT EDUCATION: Education details: discussion with patient and wife about ongoing head pain and current functional status; discussed possibility of HHPT d/t patient's limited endurance and wife's report of difficulty navigating at home Person educated: Patient and Spouse Education method: Explanation Education comprehension: verbalized understanding   HOME EXERCISE PROGRAM Last updated: 04/24/22 Access Code: [email or web address] URL: [email or web address] Date: 04/29/2022 Prepared by: [facility]  Program Notes perform at a counter top and with gait belt with wife's supervision!  Exercises - Sit to Stand with Counter Support  - 1 x daily - 5 x weekly - 2 sets - 10 reps - Wide Stance with Counter Support  - 1 x daily - 5 x weekly - 2 sets - 10 reps - Side to Side Weight Shift with Counter Support  - 1 x daily - 5 x weekly - 2 sets - 10 reps - Forward Backward Weight Shift with Counter Support  - 1 x daily - 5 x weekly - 2 sets - 10 reps    Below measures were taken at time of initial evaluation unless otherwise specified:  DIAGNOSTIC FINDINGS: CEREBRAL ANGIOGRAM 04/16/22: Complete resection of previously identified left cerebellar arteriovenous malformation, without residual nidus or early venous drainage seen.  COGNITION: Overall cognitive status:  pt notes "trying very little to think"   SENSATION: WFL  COORDINATION: Alternating pronation/supination: dysmetria on L Alternating toe tap: intact B Finger to nose: L mild-mod dysmetria and bradykinesia   MUSCLE TONE: intact B   POSTURE: No Significant postural limitations  OBSERVATION:  suboccipital incision well-healing and without red flag signs  PALPATION: no TTP in B shoulders  LOWER EXTREMITY ROM:     Active  Right Eval Left Eval  Hip flexion    Hip extension    Hip abduction    Hip adduction    Hip internal rotation    Hip external rotation    Knee flexion    Knee extension    Ankle dorsiflexion 4 -3  Ankle plantarflexion    Ankle inversion    Ankle eversion     (Blank rows = not tested)  LOWER EXTREMITY MMT:    MMT (in sitting) Right Eval Left Eval  Hip flexion 4+ 4  Hip extension    Hip abduction 4+ 4+  Hip adduction 4+ 4+  Hip internal rotation    Hip external rotation    Knee flexion 4 4  Knee extension 4 4+  Ankle dorsiflexion 4+ 4+  Ankle plantarflexion 4+ 4+  Ankle inversion    Ankle eversion    (Blank rows = not tested)  VESTIBULAR ASSESSMENT   GENERAL OBSERVATION: pt wears bifocals most of the time     OCULOMOTOR EXAM:   Ocular Alignment:  L eye slightly elevated in orbit   Ocular ROM: No Limitations   Spontaneous Nystagmus: absent   Gaze-Induced Nystagmus: absent   Smooth Pursuits:  2-3 saccades in each direction   Saccades:  poor trajectory L, overshooting R, slow vertical   Convergence/Divergence: 1.5 ft before diplopia    VESTIBULAR - OCULAR REFLEX:    Slow VOR: Normal performed within tolerable cervical ROM   VOR Cancellation: Corrective Saccades B   Head-Impulse Test:  NT     M-CTSIB  Condition 1: Firm Surface, EO 10 Sec, Severe and inability to self-correct, required mod A and walker support to steady self    Condition 2: Firm Surface, EC NT Sec,  NT  Sway  Condition 3: Foam Surface, EO NT Sec,  NT  Sway  Condition 4: Foam Surface, EC NT Sec,  NT  Sway      GAIT: Gait pattern:  B short steps with poor safety awareness Assistive device utilized: Walker - 2 wheeled Level of assistance: CGA     TODAY'S TREATMENT:                                                                                                                               DATE: 04/22/22    PATIENT EDUCATION: Education details: prognosis, POC, edu on exam findings, encouraged use of gait belt for safety at home Person educated: Patient and Spouse Education method: Explanation, Demonstration, Tactile cues, and Verbal cues Education comprehension: verbalized understanding  HOME EXERCISE PROGRAM: Not yet initiated    GOALS: Goals reviewed with patient? Yes  SHORT TERM GOALS: Target date: 05/13/2022  Patient to be independent with initial HEP. Baseline: HEP initiated Goal status: IN PROGRESS    LONG TERM GOALS: Target date: 06/17/2022  Patient to be independent with advanced HEP. Baseline: Not yet initiated  Goal status: IN PROGRESS  Patient to demonstrate B LE strength >/=4+/5.  Baseline: See above Goal status: IN PROGRESS  Patient to report 75% improvement in HAs.  Baseline: - Goal status: IN PROGRESS  Patient to no dizziness with transfers or bed mobility.  Baseline: dizziness Goal status: IN PROGRESS  Patient to complete TUG in <14 sec with LRAD in order to decrease risk of falls.   Baseline: 31.36 sec with RW Goal status: IN PROGRESS  Patient to demonstrate 5xSTS test in <15 sec in order to decrease risk of falls.  Baseline: 31.18 sec with B armrests Goal status: IN PROGRESS  Patient to score at least 40/56 on Berg in order to decrease risk of falls.  Baseline: 23/56 Goal status: IN PROGRESS   ASSESSMENT:  CLINICAL IMPRESSION: Emphasis on improving balance and coordination with focus on single limb support with activities.  Difficulty in sequence and coordination requiring cues for step length and symmetry.  Increased fatigue throughout session requiring frequent therapeutic rest periods of 1-3 minutes between activities. Quite limited in functional activity tolerance and questioning if home health services would be better option at this time due to excessive fatigue/exertion for traveling,  transferring, and ambulation to/from clinic causing undue burden.     OBJECTIVE IMPAIRMENTS: Abnormal gait, decreased balance, decreased coordination, decreased knowledge of use of DME, difficulty walking, decreased ROM, decreased strength, dizziness, and pain.   ACTIVITY LIMITATIONS: carrying, lifting, bending, sitting, standing, squatting, stairs, transfers, bed mobility, bathing, toileting, dressing, reach over head, and hygiene/grooming  PARTICIPATION LIMITATIONS: meal prep, cleaning, laundry, driving, shopping, community activity, occupation,  yard work, and church  PERSONAL FACTORS: Age, Past/current experiences, Time since onset of injury/illness/exacerbation, and 3+ comorbidities: Anxiety, RA, CAD, cardiomyopathy, chronic systolic failure, depression, HA, HLD, R TKA 2017, B ankle fusion 2014/2015, L femur fx surgery x3, craniotomy with resection of AVM 04/11/22  are also affecting patient's functional outcome.   REHAB POTENTIAL: Good  CLINICAL DECISION MAKING: Evolving/moderate complexity  EVALUATION COMPLEXITY: Moderate  PLAN:  PT FREQUENCY: 2x/week  PT DURATION: 8 weeks  PLANNED INTERVENTIONS: Therapeutic exercises, Therapeutic activity, Neuromuscular re-education, Balance training, Gait training, Patient/Family education, Self Care, Joint mobilization, Stair training, Vestibular training, Canalith repositioning, DME instructions, Aquatic Therapy, Dry Needling, Electrical stimulation, Cryotherapy, Moist heat, Taping, Manual therapy, and Re-evaluation  PLAN FOR NEXT SESSION: 30 day hold vs. DC at this time   5:04 PM, 05/07/22 M. Shary Decamp, PT, DPT Physical Therapist- [place] Office Number: [phone]   [facility] at [facility] [address] [place], [place] [address] Phone # [phone] Fax # [phone]

## 2022-05-09 ENCOUNTER — Ambulatory Visit: Payer: BC Managed Care – PPO

## 2022-05-09 ENCOUNTER — Telehealth: Payer: Self-pay | Admitting: Interventional Cardiology

## 2022-05-09 DIAGNOSIS — R278 Other lack of coordination: Secondary | ICD-10-CM | POA: Diagnosis not present

## 2022-05-09 DIAGNOSIS — R42 Dizziness and giddiness: Secondary | ICD-10-CM

## 2022-05-09 DIAGNOSIS — M6281 Muscle weakness (generalized): Secondary | ICD-10-CM | POA: Diagnosis not present

## 2022-05-09 DIAGNOSIS — R41842 Visuospatial deficit: Secondary | ICD-10-CM | POA: Diagnosis not present

## 2022-05-09 DIAGNOSIS — R2681 Unsteadiness on feet: Secondary | ICD-10-CM | POA: Diagnosis not present

## 2022-05-09 DIAGNOSIS — R262 Difficulty in walking, not elsewhere classified: Secondary | ICD-10-CM | POA: Diagnosis not present

## 2022-05-09 DIAGNOSIS — R2689 Other abnormalities of gait and mobility: Secondary | ICD-10-CM | POA: Diagnosis not present

## 2022-05-09 DIAGNOSIS — M5459 Other low back pain: Secondary | ICD-10-CM | POA: Diagnosis not present

## 2022-05-09 NOTE — Telephone Encounter (Signed)
Patient's wife reports BP has been running lower since patient had recent surgery.  He is not eating well or drinking much.  Patient will hold lisinopril as recommended below.  They will continue to monitor BP and let us know if any problems.  Wife encouraging patient to improve po intake.  Orthostatic precautions reviewed with patient's wife

## 2022-05-09 NOTE — Telephone Encounter (Signed)
Hold lisinopril and continue to monitor BP.

## 2022-05-09 NOTE — Therapy (Signed)
OUTPATIENT PHYSICAL THERAPY NEURO TREATMENT   Patient Name: Christopher Yu MRN: [id number] DOB:Dec 02, 1960, 62 y.o., male Today's Date: 05/09/2022   PCP: Lavone Orn, MD  REFERRING PROVIDER: Consuella Lose, MD  END OF SESSION:  PT End of Session - 05/09/22 0834     Visit Number 5    Number of Visits 17    Date for PT Re-Evaluation 06/17/22    Authorization Type [employer]    Authorization - Visit Number 5    Authorization - Number of Visits 15   30 PT and OT combined   PT Start Time 0845    PT Stop Time 0930    PT Time Calculation (min) 45 min    Equipment Utilized During Treatment Gait belt    Activity Tolerance Patient limited by fatigue    Behavior During Therapy Flat affect               Past Medical History:  Diagnosis Date   Anxiety    Arthritis, rheumatoid ([place])    dx 1988   CAD (coronary artery disease), native coronary artery    Mild LAD disease with calcification noted at cath 12//27/16    Cardiomyopathy [facility])    Chronic systolic heart failure ([place]) 05/01/2015   Depression    Deviated nasal septum 06/25/2011   Headache    Hyperlipidemia    Impingement syndrome of left shoulder 12/22/2017   Rheumatoid aortitis    Sleep apnea    does not use cpap - UNABLE TO TOLERATE MASK   Sleep apnea in adult    deviated septum repaired, most recent sleep study was negative   Status post total right knee replacement 06/01/2015   Past Surgical History:  Procedure Laterality Date   ANKLE FUSION Right 2014   related to his arthritis   ANKLE FUSION Left 05/25/13   related to his arthritis   APPLICATION OF CRANIAL NAVIGATION N/A 04/11/2022   Procedure: APPLICATION OF CRANIAL NAVIGATION;  Surgeon: Consuella Lose, MD;  Location: [place];  Service: Neurosurgery;  Laterality: N/A;   CARDIAC CATHETERIZATION N/A 05/01/2015   Procedure: Left Heart Cath and Coronary Angiography;  Surgeon: Belva Crome, MD;  Location: [place] [facility];  Service:  Cardiovascular;  Laterality: N/A;   CRANIOTOMY N/A 04/11/2022   Procedure: STEREOTACTIC SUBOCCIPITAL CRANIOTOMY FOR RESECTION OF ARTERIO-VENOUS MALFORMATION;  Surgeon: Consuella Lose, MD;  Location: [place];  Service: Neurosurgery;  Laterality: N/A;   FRACTURE SURGERY     left femur fracture x 3   IR ANGIO EXTERNAL CAROTID SEL EXT CAROTID BILAT MOD SED  01/28/2022   IR ANGIO INTRA EXTRACRAN SEL INTERNAL CAROTID BILAT MOD SED  01/28/2022   IR ANGIO INTRA EXTRACRAN SEL INTERNAL CAROTID BILAT MOD SED  04/16/2022   IR ANGIO VERTEBRAL SEL VERTEBRAL BILAT MOD SED  01/28/2022   IR ANGIO VERTEBRAL SEL VERTEBRAL BILAT MOD SED  04/16/2022   KNEE ARTHROSCOPY     right x 4   NASAL SEPTOPLASTY W/ TURBINOPLASTY  06/25/2011   Procedure: NASAL SEPTOPLASTY WITH TURBINATE REDUCTION;  Surgeon: Jerrell Belfast, MD;  Location: [place];  Service: ENT;  Laterality: Bilateral;   TONSILLECTOMY     TOTAL KNEE ARTHROPLASTY Right 06/01/2015   Procedure: RIGHT TOTAL KNEE ARTHROPLASTY;  Surgeon: Mcarthur Rossetti, MD;  Location: [facility];  Service: Orthopedics;  Laterality: Right;  Block+general   Patient Active Problem List   Diagnosis Date Noted   Status post craniotomy 04/11/2022   AVM (arteriovenous malformation) brain 04/11/2022  Impingement syndrome of left shoulder 12/22/2017   Impingement syndrome of right shoulder 12/22/2017   Rheumatoid arthritis involving right knee ([place]) 06/01/2015   Status post total right knee replacement 06/01/2015   Cardiomyopathy ([place]) 05/02/2015   Hyperlipidemia    Sleep apnea in adult    Rheumatoid arthritis ([place])    Chronic systolic heart failure ([place]) 05/01/2015   CAD (coronary artery disease), native coronary artery     ONSET DATE: 04/11/22  REFERRING DIAG: J49.702 (ICD-10-CM) - Status post craniotomy Q28.2 (ICD-10-CM) - AVM (arteriovenous malformation) brain  THERAPY DIAG:  Unsteadiness on feet  Muscle weakness (generalized)  Other abnormalities of gait and  mobility  Dizziness and giddiness  Difficulty in walking, not elsewhere classified  Rationale for Evaluation and Treatment: Rehabilitation  SUBJECTIVE:                                                                                                                                                                                             SUBJECTIVE STATEMENT: Feeling sluggish and some dizziness with transfers  Pt accompanied by: significant other  PERTINENT HISTORY: Anxiety, RA, CAD, cardiomyopathy, chronic systolic failure, depression, HA, HLD, R TKA 2017, B ankle fusion 2014/2015, L femur fx surgery x3, craniotomy with resection of AVM 04/11/22  PAIN:  Are you having pain? Yes: NPRS scale: "not as bad"/10 Pain location: entire back of the head Pain description: spasm down the incision line Aggravating factors: movement, light, noise Relieving factors: trying to rest, dark, quiet room  PRECAUTIONS: Other: per hospital DC note: No lifting >10 lbs, No bending/twisting at waist   WEIGHT BEARING RESTRICTIONS: No  FALLS: Has patient fallen in last 6 months? No  LIVING ENVIRONMENT: Lives with: lives with their spouse Lives in: House/apartment Stairs:  2 story house with 3 steps to enter with handrail on R Has following equipment at home: Environmental consultant - 2 wheeled and Shower bench  PLOF: Independent; [place] in home office which is on 2nd floor; golf  PATIENT GOALS: improve stability   OBJECTIVE:   TODAY'S TREATMENT: 05/09/22 Activity Comments  Seated gaze stabilization VOR x 1 horizontal: slow and with saccades present x 30 sec VOR x1 vertical, saccades present x 30 sec VOR cancellation 2x15 sec some saccadic intrusions --notes 3/10 HA symptoms  Balance/coordination -feet together EO x 30" -feet together EC x 30" mild-mod sway to left -feet together EO/EC head turns 3x--symptomatic w/ EC -standing at EOM: twist to cross midline to retrieve cones and place on ipsilat side 10x (break  after 4)   Vitals mid-session 90/74 mmHg, 104 bpm--feeling symptomatic  Positioned to supine x 5 min w/  knee wedge   Orthostatics Supine: 105/72, 72 bpm Standing: 77/65, 104 bpm       TODAY'S TREATMENT: 05/07/22 Activity Comments  Seated warm-up -AP 30 reps -LAQ 20 reps  STS to alt march 10x W/ RW for UE support  Gait training  -W/ cane, cues for sequence and guarding due to unsteadiness. -w / RW and CGA  Sidestepping At counter x 60 sec  [place] taps 1x3  Pt education -activity pacing, energy conservation, task segmentation  Vitals at end of session 106/82, 89 bpm      HOME EXERCISE PROGRAM: Access Code: [email or web address] URL: [email or web address] Date: 05/09/2022 Prepared by: Shary Decamp  Exercises - Seated Gaze Stabilization with Head Rotation  - 1 x daily - 7 x weekly - 3-5 sets - 15-30 sec hold - Seated Gaze Stabilization with Head Nod  - 1 x daily - 7 x weekly - 3-5 sets - 15-30 hold - Seated VOR Cancellation  - 1 x daily - 7 x weekly - 3-5 sets - 15-30 hold  PATIENT EDUCATION: Education details: discussion with patient and wife about ongoing head pain and current functional status; discussed possibility of HHPT d/t patient's limited endurance and wife's report of difficulty navigating at home Person educated: Patient and Spouse Education method: Explanation Education comprehension: verbalized understanding   HOME EXERCISE PROGRAM Last updated: 04/24/22 Access Code: [email or web address] URL: [email or web address] Date: 04/29/2022 Prepared by: [facility] - Outpatient  Rehab - [facility]  Program Notes perform at a counter top and with gait belt with wife's supervision!  Exercises - Sit to Stand with Counter Support  - 1 x daily - 5 x weekly - 2 sets - 10 reps - Wide Stance with Counter Support  - 1 x daily - 5 x weekly - 2 sets - 10 reps - Side to Side Weight Shift with Counter Support  - 1 x daily - 5 x weekly - 2 sets - 10 reps - Forward Backward  Weight Shift with Counter Support  - 1 x daily - 5 x weekly - 2 sets - 10 reps    Below measures were taken at time of initial evaluation unless otherwise specified:  DIAGNOSTIC FINDINGS: CEREBRAL ANGIOGRAM 04/16/22: Complete resection of previously identified left cerebellar arteriovenous malformation, without residual nidus or early venous drainage seen.  COGNITION: Overall cognitive status:  pt notes "trying very little to think"   SENSATION: WFL  COORDINATION: Alternating pronation/supination: dysmetria on L Alternating toe tap: intact B Finger to nose: L mild-mod dysmetria and bradykinesia   MUSCLE TONE: intact B   POSTURE: No Significant postural limitations  OBSERVATION: suboccipital incision well-healing and without red flag signs  PALPATION: no TTP in B shoulders  LOWER EXTREMITY ROM:     Active  Right Eval Left Eval  Hip flexion    Hip extension    Hip abduction    Hip adduction    Hip internal rotation    Hip external rotation    Knee flexion    Knee extension    Ankle dorsiflexion 4 -3  Ankle plantarflexion    Ankle inversion    Ankle eversion     (Blank rows = not tested)  LOWER EXTREMITY MMT:    MMT (in sitting) Right Eval Left Eval  Hip flexion 4+ 4  Hip extension    Hip abduction 4+ 4+  Hip adduction 4+ 4+  Hip internal rotation    Hip external rotation    Knee flexion 4 4  Knee extension 4  4+  Ankle dorsiflexion 4+ 4+  Ankle plantarflexion 4+ 4+  Ankle inversion    Ankle eversion    (Blank rows = not tested)  VESTIBULAR ASSESSMENT   GENERAL OBSERVATION: pt wears bifocals most of the time     OCULOMOTOR EXAM:   Ocular Alignment:  L eye slightly elevated in orbit   Ocular ROM: No Limitations   Spontaneous Nystagmus: absent   Gaze-Induced Nystagmus: absent   Smooth Pursuits:  2-3 saccades in each direction   Saccades:  poor trajectory L, overshooting R, slow vertical   Convergence/Divergence: 1.5 ft before diplopia     VESTIBULAR - OCULAR REFLEX:    Slow VOR: Normal performed within tolerable cervical ROM   VOR Cancellation: Corrective Saccades B   Head-Impulse Test: NT     M-CTSIB  Condition 1: Firm Surface, EO 10 Sec, Severe and inability to self-correct, required mod A and walker support to steady self    Condition 2: Firm Surface, EC NT Sec,  NT  Sway  Condition 3: Foam Surface, EO NT Sec,  NT  Sway  Condition 4: Foam Surface, EC NT Sec,  NT  Sway      GAIT: Gait pattern:  B short steps with poor safety awareness Assistive device utilized: Walker - 2 wheeled Level of assistance: CGA     TODAY'S TREATMENT:                                                                                                                              DATE: 04/22/22    PATIENT EDUCATION: Education details: prognosis, POC, edu on exam findings, encouraged use of gait belt for safety at home Person educated: Patient and Spouse Education method: Explanation, Demonstration, Tactile cues, and Verbal cues Education comprehension: verbalized understanding    GOALS: Goals reviewed with patient? Yes  SHORT TERM GOALS: Target date: 05/13/2022  Patient to be independent with initial HEP. Baseline: HEP initiated Goal status: IN PROGRESS    LONG TERM GOALS: Target date: 06/17/2022  Patient to be independent with advanced HEP. Baseline: Not yet initiated  Goal status: IN PROGRESS  Patient to demonstrate B LE strength >/=4+/5.  Baseline: See above Goal status: IN PROGRESS  Patient to report 75% improvement in HAs.  Baseline: - Goal status: IN PROGRESS  Patient to no dizziness with transfers or bed mobility.  Baseline: dizziness Goal status: IN PROGRESS  Patient to complete TUG in <14 sec with LRAD in order to decrease risk of falls.   Baseline: 31.36 sec with RW Goal status: IN PROGRESS  Patient to demonstrate 5xSTS test in <15 sec in order to decrease risk of falls.  Baseline: 31.18 sec with B  armrests Goal status: IN PROGRESS  Patient to score at least 40/56 on [place] in order to decrease risk of falls.  Baseline: 23/56 Goal status: IN PROGRESS   ASSESSMENT:  CLINICAL IMPRESSION: Session initiated with gaze stabilization activities and demonstrates significant degree of saccadic  intrusions during VOR x 1 and VOR cancellation and pt reports target to be "jumpy" in his sight.  Good performance with slow pacing and intermittently requires segmented movement vs smooth. Proceeded with balance and coordination activities from a sitting to standing position w/ pt becoming symptomatic in dizziness and lightheadedness and BP reveals low reading.  Performed orthostatic hypotension assessment from this point. See above for measurements    OBJECTIVE IMPAIRMENTS: Abnormal gait, decreased balance, decreased coordination, decreased knowledge of use of DME, difficulty walking, decreased ROM, decreased strength, dizziness, and pain.   ACTIVITY LIMITATIONS: carrying, lifting, bending, sitting, standing, squatting, stairs, transfers, bed mobility, bathing, toileting, dressing, reach over head, and hygiene/grooming  PARTICIPATION LIMITATIONS: meal prep, cleaning, laundry, driving, shopping, community activity, occupation, yard work, and church  PERSONAL FACTORS: Age, Past/current experiences, Time since onset of injury/illness/exacerbation, and 3+ comorbidities: Anxiety, RA, CAD, cardiomyopathy, chronic systolic failure, depression, HA, HLD, R TKA 2017, B ankle fusion 2014/2015, L femur fx surgery x3, craniotomy with resection of AVM 04/11/22  are also affecting patient's functional outcome.   REHAB POTENTIAL: Good  CLINICAL DECISION MAKING: Evolving/moderate complexity  EVALUATION COMPLEXITY: Moderate  PLAN:  PT FREQUENCY: 2x/week  PT DURATION: 8 weeks  PLANNED INTERVENTIONS: Therapeutic exercises, Therapeutic activity, Neuromuscular re-education, Balance training, Gait training,  Patient/Family education, Self Care, Joint mobilization, Stair training, Vestibular training, Canalith repositioning, DME instructions, Aquatic Therapy, Dry Needling, Electrical stimulation, Cryotherapy, Moist heat, Taping, Manual therapy, and Re-evaluation  PLAN FOR NEXT SESSION: monitor vitals, HEP review, standing activities as tolerated   8:35 AM, 05/09/22 M. Shary Decamp, PT, DPT Physical Therapist- [place] Office Number: [phone]   [facility] [address] [place], [place] [address] Phone # [phone] Fax # [phone]

## 2022-05-09 NOTE — Telephone Encounter (Signed)
  Pt c/o BP issue: STAT if pt c/o blurred vision, one-sided weakness or slurred speech  1. What are your last 5 BP readings?  At PT appt: 105/72 79 laying down 77/65 109 standing  At home: 100/39 58 94/39 57 95/46 57   2. Are you having any other symptoms (ex. Dizziness, headache, blurred vision, passed out)? When he stands up, pt feels like he is going to pass out  3. What is your BP issue? Pt's wife calling, she said, since pt had his brain surgery, pt's BP been low. She wanted to know if hey need to see Dr, Irish Lack

## 2022-05-12 ENCOUNTER — Telehealth: Payer: Self-pay | Admitting: Physical Therapy

## 2022-05-12 ENCOUNTER — Ambulatory Visit: Payer: BC Managed Care – PPO | Admitting: Physical Therapy

## 2022-05-12 ENCOUNTER — Ambulatory Visit: Payer: BC Managed Care – PPO | Admitting: Occupational Therapy

## 2022-05-12 ENCOUNTER — Encounter: Payer: Self-pay | Admitting: Physical Therapy

## 2022-05-12 DIAGNOSIS — M6281 Muscle weakness (generalized): Secondary | ICD-10-CM | POA: Diagnosis not present

## 2022-05-12 DIAGNOSIS — M5459 Other low back pain: Secondary | ICD-10-CM | POA: Diagnosis not present

## 2022-05-12 DIAGNOSIS — R2681 Unsteadiness on feet: Secondary | ICD-10-CM

## 2022-05-12 DIAGNOSIS — R41842 Visuospatial deficit: Secondary | ICD-10-CM

## 2022-05-12 DIAGNOSIS — R2689 Other abnormalities of gait and mobility: Secondary | ICD-10-CM

## 2022-05-12 DIAGNOSIS — R278 Other lack of coordination: Secondary | ICD-10-CM | POA: Diagnosis not present

## 2022-05-12 DIAGNOSIS — R262 Difficulty in walking, not elsewhere classified: Secondary | ICD-10-CM

## 2022-05-12 DIAGNOSIS — R42 Dizziness and giddiness: Secondary | ICD-10-CM

## 2022-05-12 NOTE — Therapy (Signed)
OUTPATIENT OCCUPATIONAL THERAPY  Treatment Note  Patient Name: Christopher Yu MRN: [id number] DOB:October 26, 1960, 62 y.o., male Today's Date: 05/12/2022  PCP: Christopher Funk, MD REFERRING PROVIDER: Lisbeth Renshaw, MD  END OF SESSION:  OT End of Session - 05/12/22 1551     Visit Number 4    Number of Visits 17    Date for OT Re-Evaluation 06/20/22    Authorization Type [employer]    OT Start Time 1535    OT Stop Time 1615    OT Time Calculation (min) 40 min    Activity Tolerance Patient tolerated treatment well    Behavior During Therapy Christopher Yu for tasks assessed/performed;Flat affect                Past Medical History:  Diagnosis Date   Anxiety    Arthritis, rheumatoid (HCC)    dx 1988   CAD (coronary artery disease), native coronary artery    Mild LAD disease with calcification noted at cath 12//27/16    Cardiomyopathy [facility])    Chronic systolic heart failure (HCC) 05/01/2015   Depression    Deviated nasal septum 06/25/2011   Headache    Hyperlipidemia    Impingement syndrome of left shoulder 12/22/2017   Rheumatoid aortitis    Sleep apnea    does not use cpap - UNABLE TO TOLERATE MASK   Sleep apnea in adult    deviated septum repaired, most recent sleep study was negative   Status post total right knee replacement 06/01/2015   Past Surgical History:  Procedure Laterality Date   ANKLE FUSION Right 2014   related to his arthritis   ANKLE FUSION Left 05/25/13   related to his arthritis   APPLICATION OF CRANIAL NAVIGATION N/A 04/11/2022   Procedure: APPLICATION OF CRANIAL NAVIGATION;  Surgeon: Christopher Renshaw, MD;  Location: [facility] OR;  Service: Neurosurgery;  Laterality: N/A;   CARDIAC CATHETERIZATION N/A 05/01/2015   Procedure: Left Heart Cath and Coronary Angiography;  Surgeon: Christopher Records, MD;  Location: [facility] INVASIVE CV LAB;  Service: Cardiovascular;  Laterality: N/A;   CRANIOTOMY N/A 04/11/2022   Procedure: STEREOTACTIC SUBOCCIPITAL CRANIOTOMY FOR  RESECTION OF ARTERIO-VENOUS MALFORMATION;  Surgeon: Christopher Renshaw, MD;  Location: [facility] OR;  Service: Neurosurgery;  Laterality: N/A;   FRACTURE SURGERY     left femur fracture x 3   IR ANGIO EXTERNAL CAROTID SEL EXT CAROTID BILAT MOD SED  01/28/2022   IR ANGIO INTRA EXTRACRAN SEL INTERNAL CAROTID BILAT MOD SED  01/28/2022   IR ANGIO INTRA EXTRACRAN SEL INTERNAL CAROTID BILAT MOD SED  04/16/2022   IR ANGIO VERTEBRAL SEL VERTEBRAL BILAT MOD SED  01/28/2022   IR ANGIO VERTEBRAL SEL VERTEBRAL BILAT MOD SED  04/16/2022   KNEE ARTHROSCOPY     right x 4   NASAL SEPTOPLASTY W/ TURBINOPLASTY  06/25/2011   Procedure: NASAL SEPTOPLASTY WITH TURBINATE REDUCTION;  Surgeon: Christopher Coho, MD;  Location: [facility] OR;  Service: ENT;  Laterality: Bilateral;   TONSILLECTOMY     TOTAL KNEE ARTHROPLASTY Right 06/01/2015   Procedure: RIGHT TOTAL KNEE ARTHROPLASTY;  Surgeon: Christopher Hitch, MD;  Location: [facility] ORS;  Service: Orthopedics;  Laterality: Right;  Block+general   Patient Active Problem List   Diagnosis Date Noted   Status post craniotomy 04/11/2022   AVM (arteriovenous malformation) brain 04/11/2022   Impingement syndrome of left shoulder 12/22/2017   Impingement syndrome of right shoulder 12/22/2017   Rheumatoid arthritis involving right knee (HCC) 06/01/2015   Status post total right knee  replacement 06/01/2015   Cardiomyopathy (HCC) 05/02/2015   Hyperlipidemia    Sleep apnea in adult    Rheumatoid arthritis (HCC)    Chronic systolic heart failure (HCC) 05/01/2015   CAD (coronary artery disease), native coronary artery     ONSET DATE: 04/11/22  REFERRING DIAG: I33.825 (ICD-10-CM) - Status post craniotomy Q28.2 (ICD-10-CM) - AVM (arteriovenous malformation) brain  THERAPY DIAG:  Unsteadiness on feet  Muscle weakness (generalized)  Other abnormalities of gait and mobility  Other lack of coordination  Visuospatial deficit  Rationale for Evaluation and Treatment:  Rehabilitation  SUBJECTIVE:   SUBJECTIVE STATEMENT: Pt reports fatigue and dizziness, especially when moving from place to place (the getting up and down). Pt accompanied by: self and significant other  PERTINENT HISTORY:  Pt is s/p suboccipital craniotomy for resection of cerebellar AVM. PMH: RA, anxiety, CAD PSH: L TKA  PRECAUTIONS: Other: no bending, twisting, lifting > 10#  WEIGHT BEARING RESTRICTIONS:  no lifting > 10#  PAIN:  Are you having pain? Yes: NPRS scale: 3/10 Pain location: headache Pain description: nonstop, dull/nagging but can become sharp/shooting Aggravating factors: movement, lights Relieving factors: nothing, however has been using ice packs to alleviate  FALLS: Has patient fallen in last 6 months? No  LIVING ENVIRONMENT: Lives with: lives with their family (wife, and 73 and 41 yo daughters) Lives in: House/apartment Stairs: Yes: Internal: full flight of steps to home office, however is currently not having to navigate steps; on right going up and External: 3 steps; on right going up Has following equipment at home: Dan Humphreys - 2 wheeled and shower chair  PLOF: Independent, Independent with basic ADLs, Independent with household mobility without device, and Vocation/Vocational requirements: working as a Production designer, theatre/television/film in [employer], requires occasional travel by plane  PATIENT GOALS: to be able to move without having to use a walker  OBJECTIVE:   HAND DOMINANCE: Right  ADLs: Transfers/ambulation related to ADLs: Min A with RW Eating: wife will assist with cutting up foods, opening items, and occasionally placing items in his hand Grooming: Supervision when standing at sink UB Dressing: Min assist LB Dressing: Mod-max assist Toileting: wife will assist with pulling up pants/hygiene, min assist for transfers off toilet Bathing: supervision/cues for bathing, wife completes 5% Tub Shower transfers: Mod-max assist transfers in/out of shower Equipment:  Shower seat without back, Walk in shower, Long handled shoe horn, and Long handled sponge   MOBILITY STATUS: Needs Assist: Min A with RW  ACTIVITY TOLERANCE: Activity tolerance: diminished  FUNCTIONAL OUTCOME MEASURES: Myrtis Ser Index of Independence in ADLs:2/6  COORDINATION: Finger Nose Finger test: overshooting mildly with LUE 9 Hole Peg test: Right: 36.38 sec; Left: 1:00.87 sec Box and Blocks:  Right 41 blocks, Left 35 blocks  COGNITION: Overall cognitive status: Difficulty to assess due to: flat affect and decreased engagement in tasks s/p surgery Pt reports that he has been avoiding his phone, television, and bill paying, anything that makes him think. Spouse reports he is on a lot of medications which may also be impacting his arousal/cognition.    VISION ASSESSMENT: To be further assessed in functional context Ocular ROM: WFL Tracking/Visual pursuits: Able to track stimulus in all quads without difficulty Saccades: additional eye shifts occurred during testing, decreased speed of saccadic movements, and additional eye shifts in B eyes when looking to L  Patient has difficulty with following activities due to following visual impairments: is currently not reading, looking at phone or tv  OBSERVATIONS: flat affect, closing eyes frequently  TODAY'S TREATMENT:                                                               05/12/22 Pt asking questions about his low BP.  Pt reports that his cardiologist discontinued one of his meds to aid in this.  Discussed positional changes with increased upright/out of bed positioning as well as possibility of wearing compression stockings for BP management. Coordination: small peg board pattern replication with focus on motor control and manipulation of small items.  Pt unable to see numbers on grid, therefore modified task to completing with colored dots to decrease visual and cognitive challenge.  Pt dropping 20% of pegs.  Pt able to recognize  that an error occurred but with increased difficulty to recognize and correct error. OT increased challenge to completing in-hand manipulation and translation of pegs from finger tips <> palm when removing pegs.  Pt demonstrating improved motor control with translation compared to one at a time placement into peg board. Coordination: w/ LUE including: flipping cards one at a time off a deck, picking up various small objects/coins, picking up a small object and translating palm-to-fingertips, picking up coins and stacking them, and picking up 5-10 coins 1 at a time and translating palm to fingertips to place in coin slot.  Pt demonstrating most difficulty with stacking and unstacking coins.  Provided with HEP of above exercises and encouraged engagement in these tasks at home.   05/06/22 IADL: pt reports attempting to work on his laptop, but finding difficulty with entering passwords and when writing emails.  Discussed cognitive, visual, as well as coordination component with computer work. Bathroom transfers: pt and spouse report quality of mobility is improving, but not quantity.  Spouse reports that pt is getting in/out of bathroom water closet with improved ease.  Spouse reports that shower transfers are still challenging as the shower is quite small and the seat is sometimes unsteady due to slope of floor.  Discussed adjusting seat legs to improve stability as able. Coordination: placing large grip pegs with LUE with focus on motor control.  Pt with mod overshooting when placing pegs.  OT increased challenge to placing stones on top of pegs starting with one at a time and progression to in-hand manipulation with stones.  Pt with min-mod difficulty dropping 40% with in-hand manipulation. Dynamic standing: Pt tolerated standing 3 mins while completing small peg board pattern replication.  Pt demonstrating significant slowing of coordination and cognition when completing peg board pattern in standing.  OT  educated on not "overloading the system" by increasing challenge too many steps.  Discussed functional carryover to home with ADLs and IADLs.   04/24/22 Large grip pegs: pattern replication with focus on visual scanning and sequencing as well as use of LUE.  Pt knocking pegs off table due to motor apraxia. Pt demonstrating good motor control with large grip pegs with placement and removal from resistive peg board. Word search: Attempted to engage in word search with focus on visual scanning and saccadic movements.  Pt able to complete 50% with increased time, however reports increasing headache and wishing to terminate task. Toilet transfers: wife asking for assistance with problem solving toilet transfers.  Pt's toilet is in a water closet therefore difficult spacing for access and for  assistance.  Discussed hand placement on RW and toilet seat (if possible) to assist in lowering self down to toilet.  OT providing verbal cues and demonstration for spouse positioning as well to aid in assisting pt with lowering him and lifting from toilet (as needed).  Discussed body mechanics for increased ease and to decrease risk of injuring back.  Pt's wife practiced with therapist, as pt not feeling up to additional movements at this time.   PATIENT EDUCATION: Education details: educated on paper/pen puzzles for vision and body mechanics and positioning for toilet transfers. Person educated: Patient and Spouse Education method: Explanation Education comprehension: verbalized understanding and needs further education  HOME EXERCISE PROGRAM: Coordination HEP   GOALS: Goals reviewed with patient? Yes  SHORT TERM GOALS: Target date: 05/23/22  Pt will be independent in HEP for fine and gross motor control. Baseline: Goal status: IN PROGRESS  2.  Pt will verbalize understanding of adaptive strategies, task modifications, and/or potential A/E needs to increase ease, safety, and independence w/  ADLs. Baseline:  Goal status: IN PROGRESS  3.  Pt will demonstrate improved ease with sit > stand to get up from toilet or other seating options in home by improving score on sit > stand to decreased fall risk.  Baseline: time TBD Goal status: IN PROGRESS  4.  Pt will perform dynamic standing task for 5 mins as needed for simple IADLs w/o LOB using DME and/or countertop support prn   Baseline:  Goal status: IN PROGRESS  5.  Pt will demonstrate improved fine motor coordination for ADLs as evidenced by decreasing 9 hole peg test score for LUE by 3 secs  Baseline:9 Hole Peg test: Right: 36.38 sec; Left: 1:00.87 sec  Goal status: IN PROGRESS   LONG TERM GOALS: Target date: 06/20/22  Pt will demonstrate shower transfers with DME PRN at supervision level. Baseline: currently sponge bathing due to fearfulness of showering, but wants to return to showers.   Goal status: IN PROGRESS   Pt will report increased participation in IADLs (such as cooking) to increase independence with functional IADLs.  Baseline:  Goal status: IN PROGRESS   Pt will complete UB dressing, to include clothing fasteners, at Mod I level with use of AE and/or alternative strategies PRN. Baseline:  Goal status: IN PROGRESS   Pt will complete LB dressing with Supervision/setup with use of AE and/or alternative strategies PRN. Baseline:  Goal status: IN PROGRESS   Pt will demonstrate improved fine motor coordination for ADLs as evidenced by decreasing 9 hole peg test score for LUE by 10 secs to increase participation in medication management. Baseline: Goal status: IN PROGRESS  ASSESSMENT:  CLINICAL IMPRESSION: Pt reports decreased headaches but still reports some dizziness and light sensitivity.  Therefore OT treated pt in small treatment room with lights dimmed to allow for increased focus.  Pt demonstrating coordination impairments with LUE, especially with manipulating small pegs to place into fingertips to  place pegs into peg board.  Vision continues to impact his sequencing and recognition of errors with pattern replication.  PERFORMANCE DEFICITS: in functional skills including ADLs, IADLs, coordination, pain, Fine motor control, Gross motor control, balance, body mechanics, endurance, decreased knowledge of precautions, decreased knowledge of use of DME, vision, and UE functional use, cognitive skills including energy/drive, problem solving, and thought, and psychosocial skills including environmental adaptation, habits, and routines and behaviors.   IMPAIRMENTS: are limiting patient from ADLs and IADLs.   CO-MORBIDITIES: may have co-morbidities  that affects occupational  performance. Patient will benefit from skilled OT to address above impairments and improve overall function.  MODIFICATION OR ASSISTANCE TO COMPLETE EVALUATION: Min-Moderate modification of tasks or assist with assess necessary to complete an evaluation.  OT OCCUPATIONAL PROFILE AND HISTORY: Detailed assessment: Review of Yu and additional review of physical, cognitive, psychosocial history related to current functional performance.  CLINICAL DECISION MAKING: Moderate - several treatment options, min-mod task modification necessary  REHAB POTENTIAL: Good  EVALUATION COMPLEXITY: Moderate    PLAN:  OT FREQUENCY: 2x/week  OT DURATION: 8 weeks  PLANNED INTERVENTIONS: self care/ADL training, therapeutic exercise, therapeutic activity, neuromuscular re-education, balance training, functional mobility training, compression bandaging, moist heat, cryotherapy, patient/family education, cognitive remediation/compensation, visual/perceptual remediation/compensation, psychosocial skills training, energy conservation, coping strategies training, and DME and/or AE instructions  RECOMMENDED OTHER SERVICES: NA  CONSULTED AND AGREED WITH PLAN OF CARE: Patient and family member/caregiver  PLAN FOR NEXT SESSION: Initiate vision  exercises (for saccades), educate on AE/DME for LB bathing/dressing, functional transfers for shower transfers, coordination, dynamic standing   Bryn Saline, OTR/L 05/12/2022, 3:51 PM

## 2022-05-12 NOTE — Therapy (Signed)
OUTPATIENT PHYSICAL THERAPY NEURO TREATMENT   Patient Name: Christopher Yu MRN: [id number] DOB:11/10/1960, 62 y.o., male Today's Date: 05/12/2022   PCP: Kirby Funk, MD  REFERRING PROVIDER: Lisbeth Renshaw, MD  END OF SESSION:  PT End of Session - 05/12/22 1701     Visit Number 6    Number of Visits 17    Date for PT Re-Evaluation 06/17/22    Authorization Type [employer]    Authorization - Visit Number 6    Authorization - Number of Visits 15   30 PT and OT combined   PT Start Time 1618    PT Stop Time 1659    PT Time Calculation (min) 41 min    Equipment Utilized During Treatment Gait belt    Activity Tolerance Patient tolerated treatment well    Behavior During Therapy Flat affect                Past Medical History:  Diagnosis Date   Anxiety    Arthritis, rheumatoid (HCC)    dx 1988   CAD (coronary artery disease), native coronary artery    Mild LAD disease with calcification noted at cath 12//27/16    Cardiomyopathy [facility])    Chronic systolic heart failure (HCC) 05/01/2015   Depression    Deviated nasal septum 06/25/2011   Headache    Hyperlipidemia    Impingement syndrome of left shoulder 12/22/2017   Rheumatoid aortitis    Sleep apnea    does not use cpap - UNABLE TO TOLERATE MASK   Sleep apnea in adult    deviated septum repaired, most recent sleep study was negative   Status post total right knee replacement 06/01/2015   Past Surgical History:  Procedure Laterality Date   ANKLE FUSION Right 2014   related to his arthritis   ANKLE FUSION Left 05/25/13   related to his arthritis   APPLICATION OF CRANIAL NAVIGATION N/A 04/11/2022   Procedure: APPLICATION OF CRANIAL NAVIGATION;  Surgeon: Lisbeth Renshaw, MD;  Location: [facility] OR;  Service: Neurosurgery;  Laterality: N/A;   CARDIAC CATHETERIZATION N/A 05/01/2015   Procedure: Left Heart Cath and Coronary Angiography;  Surgeon: Lyn Records, MD;  Location: [facility] INVASIVE CV LAB;  Service:  Cardiovascular;  Laterality: N/A;   CRANIOTOMY N/A 04/11/2022   Procedure: STEREOTACTIC SUBOCCIPITAL CRANIOTOMY FOR RESECTION OF ARTERIO-VENOUS MALFORMATION;  Surgeon: Lisbeth Renshaw, MD;  Location: [facility] OR;  Service: Neurosurgery;  Laterality: N/A;   FRACTURE SURGERY     left femur fracture x 3   IR ANGIO EXTERNAL CAROTID SEL EXT CAROTID BILAT MOD SED  01/28/2022   IR ANGIO INTRA EXTRACRAN SEL INTERNAL CAROTID BILAT MOD SED  01/28/2022   IR ANGIO INTRA EXTRACRAN SEL INTERNAL CAROTID BILAT MOD SED  04/16/2022   IR ANGIO VERTEBRAL SEL VERTEBRAL BILAT MOD SED  01/28/2022   IR ANGIO VERTEBRAL SEL VERTEBRAL BILAT MOD SED  04/16/2022   KNEE ARTHROSCOPY     right x 4   NASAL SEPTOPLASTY W/ TURBINOPLASTY  06/25/2011   Procedure: NASAL SEPTOPLASTY WITH TURBINATE REDUCTION;  Surgeon: Osborn Coho, MD;  Location: [facility];  Service: ENT;  Laterality: Bilateral;   TONSILLECTOMY     TOTAL KNEE ARTHROPLASTY Right 06/01/2015   Procedure: RIGHT TOTAL KNEE ARTHROPLASTY;  Surgeon: Kathryne Hitch, MD;  Location: [facility] ORS;  Service: Orthopedics;  Laterality: Right;  Block+general   Patient Active Problem List   Diagnosis Date Noted   Status post craniotomy 04/11/2022   AVM (arteriovenous malformation) brain 04/11/2022  Impingement syndrome of left shoulder 12/22/2017   Impingement syndrome of right shoulder 12/22/2017   Rheumatoid arthritis involving right knee ([place]) 06/01/2015   Status post total right knee replacement 06/01/2015   Cardiomyopathy ([place]) 05/02/2015   Hyperlipidemia    Sleep apnea in adult    Rheumatoid arthritis ([place])    Chronic systolic heart failure ([place]) 05/01/2015   CAD (coronary artery disease), native coronary artery     ONSET DATE: 04/11/22  REFERRING DIAG: Y85.027 (ICD-10-CM) - Status post craniotomy Q28.2 (ICD-10-CM) - AVM (arteriovenous malformation) brain  THERAPY DIAG:  Unsteadiness on feet  Muscle weakness (generalized)  Other abnormalities of gait and  mobility  Dizziness and giddiness  Difficulty in walking, not elsewhere classified  Rationale for Evaluation and Treatment: Rehabilitation  SUBJECTIVE:                                                                                                                                                                                             SUBJECTIVE STATEMENT: Still waiting to hear about HHPT. Feeling like he would prefer that once he gets a referral.   Pt accompanied by: significant other  PERTINENT HISTORY: Anxiety, RA, CAD, cardiomyopathy, chronic systolic failure, depression, HA, HLD, R TKA 2017, B ankle fusion 2014/2015, L femur fx surgery x3, craniotomy with resection of AVM 04/11/22  PAIN:  Are you having pain? Yes: NPRS scale: "6"/10 Pain location: entire back of the head Pain description: spasm down the incision line Aggravating factors: movement, light, noise Relieving factors: trying to rest, dark, quiet room  PRECAUTIONS: Other: per hospital DC note: No lifting >10 lbs, No bending/twisting at waist   WEIGHT BEARING RESTRICTIONS: No  FALLS: Has patient fallen in last 6 months? No  LIVING ENVIRONMENT: Lives with: lives with their spouse Lives in: House/apartment Stairs:  2 story house with 3 steps to enter with handrail on R Has following equipment at home: Environmental consultant - 2 wheeled and Shower bench  PLOF: Independent; [place] in home office which is on 2nd floor; golf  PATIENT GOALS: improve stability   OBJECTIVE:      TODAY'S TREATMENT: 05/12/22 Activity Comments  mini squat 10x At II bar   alt lateral steps 10x At II bar ; required standing rest break to allow dizziness to subside before continuing; cues to look straight ahead    alt posterior steps 10x  At II bar ; B UE support; narrow BOS  Sidestepping along II bars 6x Good stability; focusing gaze on target  STS focusing gaze on target 5x  Cues to scoot forward; pushing off knees to stand, using Ues to control  descent   LOB upon standing up from chair in between II bars Required min A to recover  Sitting head turns to targets 30" Moderate dizziness   Sitting head nods to targets 30", 15"   Able to complete 22 sec initially d/t moderate nausea; encouraged slower pace   Sitting VOR horizontal 30" C/o mild nausea   Sitting VOR vertical 30"  Mild dizziness   R/L elbow prop + gaze stability 4x C/o moderate dizziness; encouraged to slow speed           HOME EXERCISE PROGRAM Last updated: 05/12/22 Access Code: [email or web address] URL: [email or web address] Date: 05/12/2022 Prepared by: [facility] Out[facility]  Rehab - [facility]  Exercises - Seated Gaze Stabilization with Head Rotation  - 1 x daily - 7 x weekly - 3-5 sets - 15-30 sec hold - Seated Gaze Stabilization with Head Nod  - 1 x daily - 7 x weekly - 3-5 sets - 15-30 hold - Seated VOR Cancellation  - 1 x daily - 7 x weekly - 3-5 sets - 15-30 hold - Sidebending to Elbow Short Sit  - 1 x daily - 5 x weekly - 2-3 sets - 4 reps   PATIENT EDUCATION: Education details: educated on habituating light sensitivity by avoiding excessive sunglasses use; HEP update  Person educated: Patient Education method: Explanation, Demonstration, Tactile cues, Verbal cues, and Handouts Education comprehension: verbalized understanding and returned demonstration   Below measures were taken at time of initial evaluation unless otherwise specified:  DIAGNOSTIC FINDINGS: CEREBRAL ANGIOGRAM 04/16/22: Complete resection of previously identified left cerebellar arteriovenous malformation, without residual nidus or early venous drainage seen.  COGNITION: Overall cognitive status:  pt notes "trying very little to think"   SENSATION: WFL  COORDINATION: Alternating pronation/supination: dysmetria on L Alternating toe tap: intact B Finger to nose: L mild-mod dysmetria and bradykinesia   MUSCLE TONE: intact B   POSTURE: No Significant postural  limitations  OBSERVATION: suboccipital incision well-healing and without red flag signs  PALPATION: no TTP in B shoulders  LOWER EXTREMITY ROM:     Active  Right Eval Left Eval  Hip flexion    Hip extension    Hip abduction    Hip adduction    Hip internal rotation    Hip external rotation    Knee flexion    Knee extension    Ankle dorsiflexion 4 -3  Ankle plantarflexion    Ankle inversion    Ankle eversion     (Blank rows = not tested)  LOWER EXTREMITY MMT:    MMT (in sitting) Right Eval Left Eval  Hip flexion 4+ 4  Hip extension    Hip abduction 4+ 4+  Hip adduction 4+ 4+  Hip internal rotation    Hip external rotation    Knee flexion 4 4  Knee extension 4 4+  Ankle dorsiflexion 4+ 4+  Ankle plantarflexion 4+ 4+  Ankle inversion    Ankle eversion    (Blank rows = not tested)  VESTIBULAR ASSESSMENT   GENERAL OBSERVATION: pt wears bifocals most of the time     OCULOMOTOR EXAM:   Ocular Alignment:  L eye slightly elevated in orbit   Ocular ROM: No Limitations   Spontaneous Nystagmus: absent   Gaze-Induced Nystagmus: absent   Smooth Pursuits:  2-3 saccades in each direction   Saccades:  poor trajectory L, overshooting R, slow vertical   Convergence/Divergence: 1.5 ft before diplopia    VESTIBULAR - OCULAR REFLEX:    Slow  VOR: Normal performed within tolerable cervical ROM   VOR Cancellation: Corrective Saccades B   Head-Impulse Test: NT     M-CTSIB  Condition 1: Firm Surface, EO 10 Sec, Severe and inability to self-correct, required mod A and walker support to steady self    Condition 2: Firm Surface, EC NT Sec,  NT  Sway  Condition 3: Foam Surface, EO NT Sec,  NT  Sway  Condition 4: Foam Surface, EC NT Sec,  NT  Sway      GAIT: Gait pattern:  B short steps with poor safety awareness Assistive device utilized: Walker - 2 wheeled Level of assistance: CGA     TODAY'S TREATMENT:                                                                                                                               DATE: 04/22/22    PATIENT EDUCATION: Education details: prognosis, POC, edu on exam findings, encouraged use of gait belt for safety at home Person educated: Patient and Spouse Education method: Explanation, Demonstration, Tactile cues, and Verbal cues Education comprehension: verbalized understanding    GOALS: Goals reviewed with patient? Yes  SHORT TERM GOALS: Target date: 05/13/2022  Patient to be independent with initial HEP. Baseline: HEP initiated Goal status: IN PROGRESS    LONG TERM GOALS: Target date: 06/17/2022  Patient to be independent with advanced HEP. Baseline: Not yet initiated  Goal status: IN PROGRESS  Patient to demonstrate B LE strength >/=4+/5.  Baseline: See above Goal status: IN PROGRESS  Patient to report 75% improvement in HAs.  Baseline: - Goal status: IN PROGRESS  Patient to no dizziness with transfers or bed mobility.  Baseline: dizziness Goal status: IN PROGRESS  Patient to complete TUG in <14 sec with LRAD in order to decrease risk of falls.   Baseline: 31.36 sec with RW Goal status: IN PROGRESS  Patient to demonstrate 5xSTS test in <15 sec in order to decrease risk of falls.  Baseline: 31.18 sec with B armrests Goal status: IN PROGRESS  Patient to score at least 40/56 on [place] in order to decrease risk of falls.  Baseline: 23/56 Goal status: IN PROGRESS   ASSESSMENT:  CLINICAL IMPRESSION: Patient arrived to session with report of not hearing from HHPT agency for referral. Reports that he would prefer to be seen in HHPT. Worked on dynamic balance activities with improved capacity for functional and balance activities today compared to previous session. Patient appears safer with ambulation with RW than before; still with 1 LOB upon standing up from chair requiring min A to recover. Gaze stabilization activities brought on mild-moderate symptoms with cueing to reduce speed or  otherwise modify PRN. Patient tolerated session well and without complaints upon leaving.     OBJECTIVE IMPAIRMENTS: Abnormal gait, decreased balance, decreased coordination, decreased knowledge of use of DME, difficulty walking, decreased ROM, decreased strength, dizziness, and pain.   ACTIVITY LIMITATIONS: carrying, lifting, bending, sitting,  standing, squatting, stairs, transfers, bed mobility, bathing, toileting, dressing, reach over head, and hygiene/grooming  PARTICIPATION LIMITATIONS: meal prep, cleaning, laundry, driving, shopping, community activity, occupation, yard work, and church  PERSONAL FACTORS: Age, Past/current experiences, Time since onset of injury/illness/exacerbation, and 3+ comorbidities: Anxiety, RA, CAD, cardiomyopathy, chronic systolic failure, depression, HA, HLD, R TKA 2017, B ankle fusion 2014/2015, L femur fx surgery x3, craniotomy with resection of AVM 04/11/22  are also affecting patient's functional outcome.   REHAB POTENTIAL: Good  CLINICAL DECISION MAKING: Evolving/moderate complexity  EVALUATION COMPLEXITY: Moderate  PLAN:  PT FREQUENCY: 2x/week  PT DURATION: 8 weeks  PLANNED INTERVENTIONS: Therapeutic exercises, Therapeutic activity, Neuromuscular re-education, Balance training, Gait training, Patient/Family education, Self Care, Joint mobilization, Stair training, Vestibular training, Canalith repositioning, DME instructions, Aquatic Therapy, Dry Needling, Electrical stimulation, Cryotherapy, Moist heat, Taping, Manual therapy, and Re-evaluation  PLAN FOR NEXT SESSION: monitor vitals, HEP review, standing activities as tolerated  Anette Guarneri, PT, DPT 05/12/22 5:06 PM  [facility] [address], [address] [place], [place] [address] Phone # [phone] Fax # [phone]

## 2022-05-12 NOTE — Telephone Encounter (Signed)
Hello Dr. Kathyrn Sheriff,  I am seening [name] in OPPT s/p craniotomy for AVM. Patient is struggling with moderate-severe headaches and poor activity and therapy session tolerance. At this time I feel he may be better-served in home health PT/OT d/t poor tolerance and limited endurance. Patient and wife are agreeable. Please advise.  Thanks!  Janene Harvey, PT, DPT 04/30/22 1:09 PM  [facility] at [facility] [address] [place], [place], [place] [address] Phone # [phone] Fax # [phone]

## 2022-05-14 ENCOUNTER — Ambulatory Visit: Payer: BC Managed Care – PPO

## 2022-05-19 ENCOUNTER — Ambulatory Visit: Payer: BC Managed Care – PPO | Admitting: Occupational Therapy

## 2022-05-19 ENCOUNTER — Ambulatory Visit: Payer: BC Managed Care – PPO

## 2022-05-19 DIAGNOSIS — R262 Difficulty in walking, not elsewhere classified: Secondary | ICD-10-CM

## 2022-05-19 DIAGNOSIS — M6281 Muscle weakness (generalized): Secondary | ICD-10-CM | POA: Diagnosis not present

## 2022-05-19 DIAGNOSIS — R42 Dizziness and giddiness: Secondary | ICD-10-CM

## 2022-05-19 DIAGNOSIS — R41842 Visuospatial deficit: Secondary | ICD-10-CM

## 2022-05-19 DIAGNOSIS — R2681 Unsteadiness on feet: Secondary | ICD-10-CM

## 2022-05-19 DIAGNOSIS — M5459 Other low back pain: Secondary | ICD-10-CM

## 2022-05-19 DIAGNOSIS — R278 Other lack of coordination: Secondary | ICD-10-CM

## 2022-05-19 DIAGNOSIS — R2689 Other abnormalities of gait and mobility: Secondary | ICD-10-CM

## 2022-05-19 NOTE — Therapy (Signed)
OUTPATIENT PHYSICAL THERAPY NEURO TREATMENT   Patient Name: Christopher Yu MRN: [id number] DOB:1961/05/05, 62 y.o., male Today's Date: 05/19/2022   PCP: Kirby Funk, MD  REFERRING PROVIDER: Lisbeth Renshaw, MD  END OF SESSION:  PT End of Session - 05/19/22 1619     Visit Number 7    Number of Visits 17    Date for PT Re-Evaluation 06/17/22    Authorization Type [employer]    Authorization - Visit Number 7    Authorization - Number of Visits 15   30 PT and OT combined   PT Start Time 1618    PT Stop Time 1700    PT Time Calculation (min) 42 min    Equipment Utilized During Treatment Gait belt    Activity Tolerance Patient tolerated treatment well    Behavior During Therapy Flat affect                Past Medical History:  Diagnosis Date   Anxiety    Arthritis, rheumatoid (HCC)    dx 1988   CAD (coronary artery disease), native coronary artery    Mild LAD disease with calcification noted at cath 12//27/16    Cardiomyopathy [facility])    Chronic systolic heart failure (HCC) 05/01/2015   Depression    Deviated nasal septum 06/25/2011   Headache    Hyperlipidemia    Impingement syndrome of left shoulder 12/22/2017   Rheumatoid aortitis    Sleep apnea    does not use cpap - UNABLE TO TOLERATE MASK   Sleep apnea in adult    deviated septum repaired, most recent sleep study was negative   Status post total right knee replacement 06/01/2015   Past Surgical History:  Procedure Laterality Date   ANKLE FUSION Right 2014   related to his arthritis   ANKLE FUSION Left 05/25/13   related to his arthritis   APPLICATION OF CRANIAL NAVIGATION N/A 04/11/2022   Procedure: APPLICATION OF CRANIAL NAVIGATION;  Surgeon: Lisbeth Renshaw, MD;  Location: [facility] OR;  Service: Neurosurgery;  Laterality: N/A;   CARDIAC CATHETERIZATION N/A 05/01/2015   Procedure: Left Heart Cath and Coronary Angiography;  Surgeon: Lyn Records, MD;  Location: [facility] INVASIVE CV LAB;  Service:  Cardiovascular;  Laterality: N/A;   CRANIOTOMY N/A 04/11/2022   Procedure: STEREOTACTIC SUBOCCIPITAL CRANIOTOMY FOR RESECTION OF ARTERIO-VENOUS MALFORMATION;  Surgeon: Lisbeth Renshaw, MD;  Location: [facility] OR;  Service: Neurosurgery;  Laterality: N/A;   FRACTURE SURGERY     left femur fracture x 3   IR ANGIO EXTERNAL CAROTID SEL EXT CAROTID BILAT MOD SED  01/28/2022   IR ANGIO INTRA EXTRACRAN SEL INTERNAL CAROTID BILAT MOD SED  01/28/2022   IR ANGIO INTRA EXTRACRAN SEL INTERNAL CAROTID BILAT MOD SED  04/16/2022   IR ANGIO VERTEBRAL SEL VERTEBRAL BILAT MOD SED  01/28/2022   IR ANGIO VERTEBRAL SEL VERTEBRAL BILAT MOD SED  04/16/2022   KNEE ARTHROSCOPY     right x 4   NASAL SEPTOPLASTY W/ TURBINOPLASTY  06/25/2011   Procedure: NASAL SEPTOPLASTY WITH TURBINATE REDUCTION;  Surgeon: Osborn Coho, MD;  Location: [facility] OR;  Service: ENT;  Laterality: Bilateral;   TONSILLECTOMY     TOTAL KNEE ARTHROPLASTY Right 06/01/2015   Procedure: RIGHT TOTAL KNEE ARTHROPLASTY;  Surgeon: Kathryne Hitch, MD;  Location: [facility] ORS;  Service: Orthopedics;  Laterality: Right;  Block+general   Patient Active Problem List   Diagnosis Date Noted   Status post craniotomy 04/11/2022   AVM (arteriovenous malformation) brain 04/11/2022  Impingement syndrome of left shoulder 12/22/2017   Impingement syndrome of right shoulder 12/22/2017   Rheumatoid arthritis involving right knee ([place]) 06/01/2015   Status post total right knee replacement 06/01/2015   Cardiomyopathy ([place]) 05/02/2015   Hyperlipidemia    Sleep apnea in adult    Rheumatoid arthritis ([place])    Chronic systolic heart failure ([place]) 05/01/2015   CAD (coronary artery disease), native coronary artery     ONSET DATE: 04/11/22  REFERRING DIAG: J68.115 (ICD-10-CM) - Status post craniotomy Q28.2 (ICD-10-CM) - AVM (arteriovenous malformation) brain  THERAPY DIAG:  Unsteadiness on feet  Muscle weakness (generalized)  Other abnormalities of gait and  mobility  Dizziness and giddiness  Difficulty in walking, not elsewhere classified  Other low back pain  Rationale for Evaluation and Treatment: Rehabilitation  SUBJECTIVE:                                                                                                                                                                                             SUBJECTIVE STATEMENT: Still having a degree of dizziness and nauseated feeling . Back is ok today Pt accompanied by: significant other  PERTINENT HISTORY: Anxiety, RA, CAD, cardiomyopathy, chronic systolic failure, depression, HA, HLD, R TKA 2017, B ankle fusion 2014/2015, L femur fx surgery x3, craniotomy with resection of AVM 04/11/22  PAIN:  Are you having pain? Yes: NPRS scale: "6"/10 Pain location: entire back of the head Pain description: spasm down the incision line Aggravating factors: movement, light, noise Relieving factors: trying to rest, dark, quiet room  PRECAUTIONS: Other: per hospital DC note: No lifting >10 lbs, No bending/twisting at waist   WEIGHT BEARING RESTRICTIONS: No  FALLS: Has patient fallen in last 6 months? No  LIVING ENVIRONMENT: Lives with: lives with their spouse Lives in: House/apartment Stairs:  2 story house with 3 steps to enter with handrail on R Has following equipment at home: Environmental consultant - 2 wheeled and Shower bench  PLOF: Independent; [place] in home office which is on 2nd floor; golf  PATIENT GOALS: improve stability   OBJECTIVE:    TODAY'S TREATMENT: 05/19/22 Activity Comments  STS 3x5  Elevated EOM 25", no UE use  Seated VOR x 1 3x15 sec, saccadic intrusions horizontal > vertical, use of mirror for watching self which reduced abnormal eye movements  Walking with head turns W/ RW and performing fast head turns to targets, 5/10 lightheaded/dizzy  2MWT 280 ft w/ RW and onset of lightheaded/fatigue  vitals 120/85, 96 bpm, 97%         TODAY'S TREATMENT: 05/12/22 Activity  Comments  mini squat 10x At II bar  alt lateral steps 10x At II bar ; required standing rest break to allow dizziness to subside before continuing; cues to look straight ahead    alt posterior steps 10x  At II bar ; B UE support; narrow BOS  Sidestepping along II bars 6x Good stability; focusing gaze on target  STS focusing gaze on target 5x  Cues to scoot forward; pushing off knees to stand, using Ues to control descent   LOB upon standing up from chair in between II bars Required min A to recover  Sitting head turns to targets 30" Moderate dizziness   Sitting head nods to targets 30", 15"   Able to complete 22 sec initially d/t moderate nausea; encouraged slower pace   Sitting VOR horizontal 30" C/o mild nausea   Sitting VOR vertical 30"  Mild dizziness   R/L elbow prop + gaze stability 4x C/o moderate dizziness; encouraged to slow speed           HOME EXERCISE PROGRAM Last updated: 05/12/22 Access Code: [email or web address] URL: [email or web address] Date: 05/12/2022 Prepared by: [facility] - Outpatient  Rehab - [facility]  Exercises - Seated Gaze Stabilization with Head Rotation  - 1 x daily - 7 x weekly - 3-5 sets - 15-30 sec hold - Seated Gaze Stabilization with Head Nod  - 1 x daily - 7 x weekly - 3-5 sets - 15-30 hold - Seated VOR Cancellation  - 1 x daily - 7 x weekly - 3-5 sets - 15-30 hold - Sidebending to Elbow Short Sit  - 1 x daily - 5 x weekly - 2-3 sets - 4 reps   PATIENT EDUCATION: Education details: educated on habituating light sensitivity by avoiding excessive sunglasses use; HEP update  Person educated: Patient Education method: Explanation, Demonstration, Tactile cues, Verbal cues, and Handouts Education comprehension: verbalized understanding and returned demonstration   Below measures were taken at time of initial evaluation unless otherwise specified:  DIAGNOSTIC FINDINGS: CEREBRAL ANGIOGRAM 04/16/22: Complete resection of previously identified  left cerebellar arteriovenous malformation, without residual nidus or early venous drainage seen.  COGNITION: Overall cognitive status:  pt notes "trying very little to think"   SENSATION: WFL  COORDINATION: Alternating pronation/supination: dysmetria on L Alternating toe tap: intact B Finger to nose: L mild-mod dysmetria and bradykinesia   MUSCLE TONE: intact B   POSTURE: No Significant postural limitations  OBSERVATION: suboccipital incision well-healing and without red flag signs  PALPATION: no TTP in B shoulders  LOWER EXTREMITY ROM:     Active  Right Eval Left Eval  Hip flexion    Hip extension    Hip abduction    Hip adduction    Hip internal rotation    Hip external rotation    Knee flexion    Knee extension    Ankle dorsiflexion 4 -3  Ankle plantarflexion    Ankle inversion    Ankle eversion     (Blank rows = not tested)  LOWER EXTREMITY MMT:    MMT (in sitting) Right Eval Left Eval  Hip flexion 4+ 4  Hip extension    Hip abduction 4+ 4+  Hip adduction 4+ 4+  Hip internal rotation    Hip external rotation    Knee flexion 4 4  Knee extension 4 4+  Ankle dorsiflexion 4+ 4+  Ankle plantarflexion 4+ 4+  Ankle inversion    Ankle eversion    (Blank rows = not tested)  VESTIBULAR ASSESSMENT   GENERAL OBSERVATION: pt wears bifocals most of  the time     OCULOMOTOR EXAM:   Ocular Alignment:  L eye slightly elevated in orbit   Ocular ROM: No Limitations   Spontaneous Nystagmus: absent   Gaze-Induced Nystagmus: absent   Smooth Pursuits:  2-3 saccades in each direction   Saccades:  poor trajectory L, overshooting R, slow vertical   Convergence/Divergence: 1.5 ft before diplopia    VESTIBULAR - OCULAR REFLEX:    Slow VOR: Normal performed within tolerable cervical ROM   VOR Cancellation: Corrective Saccades B   Head-Impulse Test: NT     M-CTSIB  Condition 1: Firm Surface, EO 10 Sec, Severe and inability to self-correct, required mod A and  walker support to steady self    Condition 2: Firm Surface, EC NT Sec,  NT  Sway  Condition 3: Foam Surface, EO NT Sec,  NT  Sway  Condition 4: Foam Surface, EC NT Sec,  NT  Sway      GAIT: Gait pattern:  B short steps with poor safety awareness Assistive device utilized: Walker - 2 wheeled Level of assistance: CGA     TODAY'S TREATMENT:                                                                                                                              DATE: 04/22/22    PATIENT EDUCATION: Education details: prognosis, POC, edu on exam findings, encouraged use of gait belt for safety at home Person educated: Patient and Spouse Education method: Explanation, Demonstration, Tactile cues, and Verbal cues Education comprehension: verbalized understanding    GOALS: Goals reviewed with patient? Yes  SHORT TERM GOALS: Target date: 05/13/2022  Patient to be independent with initial HEP. Baseline: HEP initiated Goal status: IN PROGRESS    LONG TERM GOALS: Target date: 06/17/2022  Patient to be independent with advanced HEP. Baseline: Not yet initiated  Goal status: IN PROGRESS  Patient to demonstrate B LE strength >/=4+/5.  Baseline: See above Goal status: IN PROGRESS  Patient to report 75% improvement in HAs.  Baseline: - Goal status: IN PROGRESS  Patient to no dizziness with transfers or bed mobility.  Baseline: dizziness Goal status: IN PROGRESS  Patient to complete TUG in <14 sec with LRAD in order to decrease risk of falls.   Baseline: 31.36 sec with RW Goal status: IN PROGRESS  Patient to demonstrate 5xSTS test in <15 sec in order to decrease risk of falls.  Baseline: 31.18 sec with B armrests Goal status: IN PROGRESS  Patient to score at least 40/56 on [place] in order to decrease risk of falls.  Baseline: 23/56 Goal status: IN PROGRESS   ASSESSMENT:  CLINICAL IMPRESSION: Pt notes ongoing issues of pervasive dizziness/unsteadiness and also  limited by lack of endurance/activity tolerance and notes limited stamina when standing at home and performing activities.  Gaze stabilization activities initiated in sitting with fixation on near target and demonstrating saccadic intrusions horizontal > vertical.  Dynamic gait activity x 2.5  min with onset of fatigue and symptoms.  Following pt increasingly fatigued and seemingly agitated-anxious behaviors noted and requesting end of session.  Discussed with pt and spouse regarding activity intensity and perhaps need to reduce therapy days when OT and PT are performed back to back either appointments spaced apart by day and/or time if amenable.     OBJECTIVE IMPAIRMENTS: Abnormal gait, decreased balance, decreased coordination, decreased knowledge of use of DME, difficulty walking, decreased ROM, decreased strength, dizziness, and pain.   ACTIVITY LIMITATIONS: carrying, lifting, bending, sitting, standing, squatting, stairs, transfers, bed mobility, bathing, toileting, dressing, reach over head, and hygiene/grooming  PARTICIPATION LIMITATIONS: meal prep, cleaning, laundry, driving, shopping, community activity, occupation, yard work, and church  PERSONAL FACTORS: Age, Past/current experiences, Time since onset of injury/illness/exacerbation, and 3+ comorbidities: Anxiety, RA, CAD, cardiomyopathy, chronic systolic failure, depression, HA, HLD, R TKA 2017, B ankle fusion 2014/2015, L femur fx surgery x3, craniotomy with resection of AVM 04/11/22  are also affecting patient's functional outcome.   REHAB POTENTIAL: Good  CLINICAL DECISION MAKING: Evolving/moderate complexity  EVALUATION COMPLEXITY: Moderate  PLAN:  PT FREQUENCY: 2x/week  PT DURATION: 8 weeks  PLANNED INTERVENTIONS: Therapeutic exercises, Therapeutic activity, Neuromuscular re-education, Balance training, Gait training, Patient/Family education, Self Care, Joint mobilization, Stair training, Vestibular training, Canalith  repositioning, DME instructions, Aquatic Therapy, Dry Needling, Electrical stimulation, Cryotherapy, Moist heat, Taping, Manual therapy, and Re-evaluation  PLAN FOR NEXT SESSION: monitor vitals, HEP review, standing activities as tolerated.  4:54 PM, 05/19/22 M. Shary Decamp, PT, DPT Physical Therapist- [place] Office Number: [phone]   [facility] at [facility] [address] [place], [place] [address] Phone # [phone] Fax # [phone]

## 2022-05-19 NOTE — Therapy (Signed)
OUTPATIENT OCCUPATIONAL THERAPY  Treatment Note  Patient Name: Christopher Yu MRN: [id number] DOB:[birth year], 62 y.o., male Today's Date: 05/19/2022  PCP: Kirby Funk, MD REFERRING PROVIDER: Lisbeth Renshaw, MD  END OF SESSION:  OT End of Session - 05/19/22 1535     Visit Number 5    Number of Visits 17    Date for OT Re-Evaluation 06/20/22    Authorization Type [employer]    OT Start Time 1535    OT Stop Time 1615    OT Time Calculation (min) 40 min    Activity Tolerance Patient tolerated treatment well    Behavior During Therapy [facility] for tasks assessed/performed;Flat affect                 Past Medical History:  Diagnosis Date   Anxiety    Arthritis, rheumatoid (HCC)    dx 1988   CAD (coronary artery disease), native coronary artery    Mild LAD disease with calcification noted at cath 12//27/16    Cardiomyopathy [facility])    Chronic systolic heart failure (HCC) 05/01/2015   Depression    Deviated nasal septum 06/25/2011   Headache    Hyperlipidemia    Impingement syndrome of left shoulder 12/22/2017   Rheumatoid aortitis    Sleep apnea    does not use cpap - UNABLE TO TOLERATE MASK   Sleep apnea in adult    deviated septum repaired, most recent sleep study was negative   Status post total right knee replacement 06/01/2015   Past Surgical History:  Procedure Laterality Date   ANKLE FUSION Right 2014   related to his arthritis   ANKLE FUSION Left 05/25/13   related to his arthritis   APPLICATION OF CRANIAL NAVIGATION N/A 04/11/2022   Procedure: APPLICATION OF CRANIAL NAVIGATION;  Surgeon: Lisbeth Renshaw, MD;  Location: [facility] OR;  Service: Neurosurgery;  Laterality: N/A;   CARDIAC CATHETERIZATION N/A 05/01/2015   Procedure: Left Heart Cath and Coronary Angiography;  Surgeon: Lyn Records, MD;  Location: [facility] INVASIVE CV LAB;  Service: Cardiovascular;  Laterality: N/A;   CRANIOTOMY N/A 04/11/2022   Procedure: STEREOTACTIC SUBOCCIPITAL CRANIOTOMY FOR  RESECTION OF ARTERIO-VENOUS MALFORMATION;  Surgeon: Lisbeth Renshaw, MD;  Location: [facility] OR;  Service: Neurosurgery;  Laterality: N/A;   FRACTURE SURGERY     left femur fracture x 3   IR ANGIO EXTERNAL CAROTID SEL EXT CAROTID BILAT MOD SED  01/28/2022   IR ANGIO INTRA EXTRACRAN SEL INTERNAL CAROTID BILAT MOD SED  01/28/2022   IR ANGIO INTRA EXTRACRAN SEL INTERNAL CAROTID BILAT MOD SED  04/16/2022   IR ANGIO VERTEBRAL SEL VERTEBRAL BILAT MOD SED  01/28/2022   IR ANGIO VERTEBRAL SEL VERTEBRAL BILAT MOD SED  04/16/2022   KNEE ARTHROSCOPY     right x 4   NASAL SEPTOPLASTY W/ TURBINOPLASTY  06/25/2011   Procedure: NASAL SEPTOPLASTY WITH TURBINATE REDUCTION;  Surgeon: Osborn Coho, MD;  Location: [facility] OR;  Service: ENT;  Laterality: Bilateral;   TONSILLECTOMY     TOTAL KNEE ARTHROPLASTY Right 06/01/2015   Procedure: RIGHT TOTAL KNEE ARTHROPLASTY;  Surgeon: Kathryne Hitch, MD;  Location: [facility] ORS;  Service: Orthopedics;  Laterality: Right;  Block+general   Patient Active Problem List   Diagnosis Date Noted   Status post craniotomy 04/11/2022   AVM (arteriovenous malformation) brain 04/11/2022   Impingement syndrome of left shoulder 12/22/2017   Impingement syndrome of right shoulder 12/22/2017   Rheumatoid arthritis involving right knee (HCC) 06/01/2015   Status post total right  knee replacement 06/01/2015   Cardiomyopathy (HCC) 05/02/2015   Hyperlipidemia    Sleep apnea in adult    Rheumatoid arthritis (HCC)    Chronic systolic heart failure (HCC) 05/01/2015   CAD (coronary artery disease), native coronary artery     ONSET DATE: 04/11/22  REFERRING DIAG: E83.151 (ICD-10-CM) - Status post craniotomy Q28.2 (ICD-10-CM) - AVM (arteriovenous malformation) brain  THERAPY DIAG:  Unsteadiness on feet  Other lack of coordination  Visuospatial deficit  Muscle weakness (generalized)  Rationale for Evaluation and Treatment: Rehabilitation  SUBJECTIVE:   SUBJECTIVE STATEMENT: Pt  reports things are not "too bad", other that not being able to sleep.  Pt accompanied by: self and significant other  PERTINENT HISTORY:  Pt is s/p suboccipital craniotomy for resection of cerebellar AVM. PMH: RA, anxiety, CAD PSH: L TKA  PRECAUTIONS: Other: no bending, twisting, lifting > 10#  WEIGHT BEARING RESTRICTIONS:  no lifting > 10#  PAIN:  Are you having pain? Yes: NPRS scale: 5/10 Pain location: ear ache Pain description: nonstop, dull/nagging but can become sharp/shooting Aggravating factors: movement, lights Relieving factors: nothing, however has been using ice packs to alleviate  FALLS: Has patient fallen in last 6 months? No  LIVING ENVIRONMENT: Lives with: lives with their family (wife, and 18 and 71 yo daughters) Lives in: House/apartment Stairs: Yes: Internal: full flight of steps to home office, however is currently not having to navigate steps; on right going up and External: 3 steps; on right going up Has following equipment at home: Dan Humphreys - 2 wheeled and shower chair  PLOF: Independent, Independent with basic ADLs, Independent with household mobility without device, and Vocation/Vocational requirements: working as a Production designer, theatre/television/film in [employer], requires occasional travel by plane  PATIENT GOALS: to be able to move without having to use a walker  OBJECTIVE:   HAND DOMINANCE: Right  ADLs: Transfers/ambulation related to ADLs: Min A with RW Eating: wife will assist with cutting up foods, opening items, and occasionally placing items in his hand Grooming: Supervision when standing at sink UB Dressing: Min assist LB Dressing: Mod-max assist Toileting: wife will assist with pulling up pants/hygiene, min assist for transfers off toilet Bathing: supervision/cues for bathing, wife completes 5% Tub Shower transfers: Mod-max assist transfers in/out of shower Equipment: Shower seat without back, Walk in shower, Long handled shoe horn, and Long handled  sponge   MOBILITY STATUS: Needs Assist: Min A with RW  ACTIVITY TOLERANCE: Activity tolerance: diminished  FUNCTIONAL OUTCOME MEASURES: Myrtis Ser Index of Independence in ADLs:2/6  COORDINATION: Finger Nose Finger test: overshooting mildly with LUE 9 Hole Peg test: Right: 36.38 sec; Left: 1:00.87 sec Box and Blocks:  Right 41 blocks, Left 35 blocks  COGNITION: Overall cognitive status: Difficulty to assess due to: flat affect and decreased engagement in tasks s/p surgery Pt reports that he has been avoiding his phone, television, and bill paying, anything that makes him think. Spouse reports he is on a lot of medications which may also be impacting his arousal/cognition.    VISION ASSESSMENT: To be further assessed in functional context Ocular ROM: WFL Tracking/Visual pursuits: Able to track stimulus in all quads without difficulty Saccades: additional eye shifts occurred during testing, decreased speed of saccadic movements, and additional eye shifts in B eyes when looking to L  Patient has difficulty with following activities due to following visual impairments: is currently not reading, looking at phone or tv  OBSERVATIONS: flat affect, closing eyes frequently   TODAY'S TREATMENT:  05/19/22 Sleep: pt reports that he tosses and turns at night and will occasionally nap during the day if he did not get good sleep overnight.  Discussed sleep hygiene with attempting to decrease day time sleeping to allow for increased sleep overnight.  Pt reports turning off phone alerts to decrease stimulus that may awaken him at night.  Attributes restlessness to pain in head when he rolls over. Balance: Pt reports safety awareness with being able to walk around the kitchen without RW, however holding on to counter throughout kitchen.  Dynamic standing to engage in pipe tree puzzle pattern replication in standing.  Pt completing while  maintaining standing 3 mins before requiring seated rest break.  Pt demonstrating mild balance perturbations without UE support, but no LOB or reports of lightheadedness or dizziness.   Coordination: Noted ataxia in LUE when attempting pipe tree puzzle connections with LUE, frequently overshooting.  Noted increased ataxia with horizontal movements vs vertical movements.  OT did note and educated on decreased ataxia with elbow proximity to torso. 9 hole peg test: L: 1:01.41 sec.  Pt continues to demonstrate decreased motor control and ataxia, and difficulty placing pegs into peg holes.   05/12/22 Pt asking questions about his low BP.  Pt reports that his cardiologist discontinued one of his meds to aid in this.  Discussed positional changes with increased upright/out of bed positioning as well as possibility of wearing compression stockings for BP management. Coordination: small peg board pattern replication with focus on motor control and manipulation of small items.  Pt unable to see numbers on grid, therefore modified task to completing with colored dots to decrease visual and cognitive challenge.  Pt dropping 20% of pegs.  Pt able to recognize that an error occurred but with increased difficulty to recognize and correct error. OT increased challenge to completing in-hand manipulation and translation of pegs from finger tips <> palm when removing pegs.  Pt demonstrating improved motor control with translation compared to one at a time placement into peg board. Coordination: w/ LUE including: flipping cards one at a time off a deck, picking up various small objects/coins, picking up a small object and translating palm-to-fingertips, picking up coins and stacking them, and picking up 5-10 coins 1 at a time and translating palm to fingertips to place in coin slot.  Pt demonstrating most difficulty with stacking and unstacking coins.  Provided with HEP of above exercises and encouraged engagement in these tasks  at home.   05/06/22 IADL: pt reports attempting to work on his laptop, but finding difficulty with entering passwords and when writing emails.  Discussed cognitive, visual, as well as coordination component with computer work. Bathroom transfers: pt and spouse report quality of mobility is improving, but not quantity.  Spouse reports that pt is getting in/out of bathroom water closet with improved ease.  Spouse reports that shower transfers are still challenging as the shower is quite small and the seat is sometimes unsteady due to slope of floor.  Discussed adjusting seat legs to improve stability as able. Coordination: placing large grip pegs with LUE with focus on motor control.  Pt with mod overshooting when placing pegs.  OT increased challenge to placing stones on top of pegs starting with one at a time and progression to in-hand manipulation with stones.  Pt with min-mod difficulty dropping 40% with in-hand manipulation. Dynamic standing: Pt tolerated standing 3 mins while completing small peg board pattern replication.  Pt demonstrating significant slowing of coordination and cognition  when completing peg board pattern in standing.  OT educated on not "overloading the system" by increasing challenge too many steps.  Discussed functional carryover to home with ADLs and IADLs.   PATIENT EDUCATION: Education details: educated on paper/pen puzzles for vision and body mechanics and positioning for toilet transfers. Person educated: Patient and Spouse Education method: Explanation Education comprehension: verbalized understanding and needs further education  HOME EXERCISE PROGRAM: Coordination HEP   GOALS: Goals reviewed with patient? Yes  SHORT TERM GOALS: Target date: 05/23/22  Pt will be independent in HEP for fine and gross motor control. Baseline: Goal status: IN PROGRESS  2.  Pt will verbalize understanding of adaptive strategies, task modifications, and/or potential A/E needs to  increase ease, safety, and independence w/ ADLs. Baseline:  Goal status: IN PROGRESS  3.  Pt will demonstrate improved ease with sit > stand to get up from toilet or other seating options in home by improving score on sit > stand to decreased fall risk.  Baseline: time TBD Goal status: IN PROGRESS  4.  Pt will perform dynamic standing task for 5 mins as needed for simple IADLs w/o LOB using DME and/or countertop support prn   Baseline:  Goal status: IN PROGRESS  5.  Pt will demonstrate improved fine motor coordination for ADLs as evidenced by decreasing 9 hole peg test score for LUE by 3 secs  Baseline:9 Hole Peg test: Right: 36.38 sec; Left: 1:00.87 sec  Goal status: IN PROGRESS   LONG TERM GOALS: Target date: 06/20/22  Pt will demonstrate shower transfers with DME PRN at supervision level. Baseline: currently sponge bathing due to fearfulness of showering, but wants to return to showers.   Goal status: IN PROGRESS   Pt will report increased participation in IADLs (such as cooking) to increase independence with functional IADLs.  Baseline:  Goal status: IN PROGRESS   Pt will complete UB dressing, to include clothing fasteners, at Mod I level with use of AE and/or alternative strategies PRN. Baseline:  Goal status: IN PROGRESS   Pt will complete LB dressing with Supervision/setup with use of AE and/or alternative strategies PRN. Baseline:  Goal status: IN PROGRESS   Pt will demonstrate improved fine motor coordination for ADLs as evidenced by decreasing 9 hole peg test score for LUE by 10 secs to increase participation in medication management. Baseline: Goal status: IN PROGRESS  ASSESSMENT:  CLINICAL IMPRESSION: Pt reports decreased headaches but ongoing "pressure" in head especially when turning over in bed at night time.  Pt continues to demonstrate coordination impairments with LUE, both fine and gross motor control with small pegs and when completing pipe tree pattern.   Discussed visual attention to tasks and possible proximity of elbows to torso to minimize gross motor ataxia.  PERFORMANCE DEFICITS: in functional skills including ADLs, IADLs, coordination, pain, Fine motor control, Gross motor control, balance, body mechanics, endurance, decreased knowledge of precautions, decreased knowledge of use of DME, vision, and UE functional use, cognitive skills including energy/drive, problem solving, and thought, and psychosocial skills including environmental adaptation, habits, and routines and behaviors.   IMPAIRMENTS: are limiting patient from ADLs and IADLs.   CO-MORBIDITIES: may have co-morbidities  that affects occupational performance. Patient will benefit from skilled OT to address above impairments and improve overall function.  MODIFICATION OR ASSISTANCE TO COMPLETE EVALUATION: Min-Moderate modification of tasks or assist with assess necessary to complete an evaluation.  OT OCCUPATIONAL PROFILE AND HISTORY: Detailed assessment: Review of records and additional review of physical,  cognitive, psychosocial history related to current functional performance.  CLINICAL DECISION MAKING: Moderate - several treatment options, min-mod task modification necessary  REHAB POTENTIAL: Good  EVALUATION COMPLEXITY: Moderate    PLAN:  OT FREQUENCY: 2x/week  OT DURATION: 8 weeks  PLANNED INTERVENTIONS: self care/ADL training, therapeutic exercise, therapeutic activity, neuromuscular re-education, balance training, functional mobility training, compression bandaging, moist heat, cryotherapy, patient/family education, cognitive remediation/compensation, visual/perceptual remediation/compensation, psychosocial skills training, energy conservation, coping strategies training, and DME and/or AE instructions  RECOMMENDED OTHER SERVICES: NA  CONSULTED AND AGREED WITH PLAN OF CARE: Patient and family member/caregiver  PLAN FOR NEXT SESSION: Initiate vision exercises (for  saccades), educate on AE/DME for LB bathing/dressing, functional transfers for shower transfers, coordination, dynamic standing   Lizette Pazos, [place], OTR/L 05/19/2022, 3:35 PM

## 2022-05-21 ENCOUNTER — Ambulatory Visit: Payer: BC Managed Care – PPO | Admitting: Occupational Therapy

## 2022-05-21 ENCOUNTER — Ambulatory Visit: Payer: BC Managed Care – PPO

## 2022-05-22 ENCOUNTER — Ambulatory Visit: Payer: BC Managed Care – PPO | Admitting: Rehabilitative and Restorative Service Providers"

## 2022-05-22 ENCOUNTER — Encounter: Payer: Self-pay | Admitting: Rehabilitative and Restorative Service Providers"

## 2022-05-22 DIAGNOSIS — R262 Difficulty in walking, not elsewhere classified: Secondary | ICD-10-CM | POA: Diagnosis not present

## 2022-05-22 DIAGNOSIS — M5459 Other low back pain: Secondary | ICD-10-CM | POA: Diagnosis not present

## 2022-05-22 DIAGNOSIS — M6281 Muscle weakness (generalized): Secondary | ICD-10-CM

## 2022-05-22 DIAGNOSIS — R278 Other lack of coordination: Secondary | ICD-10-CM | POA: Diagnosis not present

## 2022-05-22 DIAGNOSIS — R42 Dizziness and giddiness: Secondary | ICD-10-CM

## 2022-05-22 DIAGNOSIS — R2689 Other abnormalities of gait and mobility: Secondary | ICD-10-CM | POA: Diagnosis not present

## 2022-05-22 DIAGNOSIS — R2681 Unsteadiness on feet: Secondary | ICD-10-CM

## 2022-05-22 DIAGNOSIS — R41842 Visuospatial deficit: Secondary | ICD-10-CM | POA: Diagnosis not present

## 2022-05-22 NOTE — Therapy (Signed)
OUTPATIENT PHYSICAL THERAPY NEURO TREATMENT   Patient Name: Christopher Yu MRN: [id number] DOB:[birth year], 62 y.o., male Today's Date: 05/22/2022   PCP: Lavone Orn, MD  REFERRING PROVIDER: Consuella Lose, MD  END OF SESSION:  PT End of Session - 05/22/22 1351     Visit Number 8    Number of Visits 17    Date for PT Re-Evaluation 06/17/22    Authorization Type BC[employer]    Authorization - Visit Number 8    Authorization - Number of Visits 15   30 PT and OT combined   PT Start Time 1400    PT Stop Time 2355    PT Time Calculation (min) 45 min    Equipment Utilized During Treatment Gait belt    Activity Tolerance Patient tolerated treatment well    Behavior During Therapy Flat affect             Past Medical History:  Diagnosis Date   Anxiety    Arthritis, rheumatoid ([place])    dx 1988   CAD (coronary artery disease), native coronary artery    Mild LAD disease with calcification noted at cath 12//27/16    Cardiomyopathy [facility])    Chronic systolic heart failure ([place]) 05/01/2015   Depression    Deviated nasal septum 06/25/2011   Headache    Hyperlipidemia    Impingement syndrome of left shoulder 12/22/2017   Rheumatoid aortitis    Sleep apnea    does not use cpap - UNABLE TO TOLERATE MASK   Sleep apnea in adult    deviated septum repaired, most recent sleep study was negative   Status post total right knee replacement 06/01/2015   Past Surgical History:  Procedure Laterality Date   ANKLE FUSION Right 2014   related to his arthritis   ANKLE FUSION Left 05/25/13   related to his arthritis   APPLICATION OF CRANIAL NAVIGATION N/A 04/11/2022   Procedure: APPLICATION OF CRANIAL NAVIGATION;  Surgeon: Consuella Lose, MD;  Location: [facility];  Service: Neurosurgery;  Laterality: N/A;   CARDIAC CATHETERIZATION N/A 05/01/2015   Procedure: Left Heart Cath and Coronary Angiography;  Surgeon: Belva Crome, MD;  Location: [facility];  Service:  Cardiovascular;  Laterality: N/A;   CRANIOTOMY N/A 04/11/2022   Procedure: STEREOTACTIC SUBOCCIPITAL CRANIOTOMY FOR RESECTION OF ARTERIO-VENOUS MALFORMATION;  Surgeon: Consuella Lose, MD;  Location: [facility];  Service: Neurosurgery;  Laterality: N/A;   FRACTURE SURGERY     left femur fracture x 3   IR ANGIO EXTERNAL CAROTID SEL EXT CAROTID BILAT MOD SED  01/28/2022   IR ANGIO INTRA EXTRACRAN SEL INTERNAL CAROTID BILAT MOD SED  01/28/2022   IR ANGIO INTRA EXTRACRAN SEL INTERNAL CAROTID BILAT MOD SED  04/16/2022   IR ANGIO VERTEBRAL SEL VERTEBRAL BILAT MOD SED  01/28/2022   IR ANGIO VERTEBRAL SEL VERTEBRAL BILAT MOD SED  04/16/2022   KNEE ARTHROSCOPY     right x 4   NASAL SEPTOPLASTY W/ TURBINOPLASTY  06/25/2011   Procedure: NASAL SEPTOPLASTY WITH TURBINATE REDUCTION;  Surgeon: Jerrell Belfast, MD;  Location: [facility];  Service: ENT;  Laterality: Bilateral;   TONSILLECTOMY     TOTAL KNEE ARTHROPLASTY Right 06/01/2015   Procedure: RIGHT TOTAL KNEE ARTHROPLASTY;  Surgeon: Mcarthur Rossetti, MD;  Location: [facility];  Service: Orthopedics;  Laterality: Right;  Block+general   Patient Active Problem List   Diagnosis Date Noted   Status post craniotomy 04/11/2022   AVM (arteriovenous malformation) brain 04/11/2022   Impingement syndrome  of left shoulder 12/22/2017   Impingement syndrome of right shoulder 12/22/2017   Rheumatoid arthritis involving right knee ([employer]) 06/01/2015   Status post total right knee replacement 06/01/2015   Cardiomyopathy ([place]) 05/02/2015   Hyperlipidemia    Sleep apnea in adult    Rheumatoid arthritis ([place])    Chronic systolic heart failure ([place]) 05/01/2015   CAD (coronary artery disease), native coronary artery     ONSET DATE: 04/11/22  REFERRING DIAG: [id number] (ICD-10-CM) - Status post craniotomy Q28.2 (ICD-10-CM) - AVM (arteriovenous malformation) brain  THERAPY DIAG:  Unsteadiness on feet  Dizziness and giddiness  Muscle weakness (generalized)  Other  abnormalities of gait and mobility  Rationale for Evaluation and Treatment: Rehabilitation  SUBJECTIVE:                                                                                                                                                                                             SUBJECTIVE STATEMENT: The patient is not doing HEP regularly. He did 2 minutes on his recumbent bike, rested, then did 3 minutes. The patient notes looking into a mirror with head turns, he doesn't get dizzy. He is also doing a playing card with visual tracking (gaze x 1)-- he gets a headache with this activity. HA comes on quickly and he is taking Advil and Tylenol to manage. He cannot get sleep because he rolls on the back of his head and wakes (he is not sure if it is b/c of the wound). Traveling here provokes nausea and it takes some time to reduce symptoms upon arrival. He is taking meds for motion sickness. He has had one fall-- he moves quick and doesn't like to use the RW at home--it makes his dog get close to his feet. Pt accompanied by: significant other, Kathlee Nations  PERTINENT HISTORY: Anxiety, RA, CAD, cardiomyopathy, chronic systolic failure, depression, HA, HLD, R TKA 2017, B ankle fusion 2014/2015, L femur fx surgery x3, craniotomy with resection of AVM 04/11/22  PAIN:  Are you having pain? Yes: NPRS scale: 5/10 Pain location: Headache Pain description: headache Aggravating factors: movement, light, noise Relieving factors: trying to rest, dark, quiet room  PRECAUTIONS: Other: per hospital DC note: No lifting >10 lbs, No bending/twisting at waist   WEIGHT BEARING RESTRICTIONS: No  FALLS: Has patient fallen in last 6 months? No  LIVING ENVIRONMENT: Lives with: lives with their spouse Lives in: House/apartment Stairs:  2 story house with 3 steps to enter with handrail on R Has following equipment at home: Gilford Rile - 2 wheeled and Shower bench  PLOF: Independent; [facility] in home office which is on 2nd  floor; golf  PATIENT GOALS: improve stability   OBJECTIVE:   [facility] Adult PT Treatment:                                                DATE: 05/22/22 Vitals: 114/86, hr=99 Therapeutic Exercise: Supine Bridges x 10 reps Neuromuscular re-ed: Habituation rolling x 4 reps-- supine rolling does not provokes dizziness. He does note some discomfort in back of his head.  Moving supine to sitting (after rolling), he reports dizziness 5/10 Gait: Gait activities with RW with supervision with fast movement and cues to slow speed x 75 feet Gait without device in clinic with gait belt with CGA x 100 feet with cues to slow pace Gait with SPC with cues for technique + close supervision and setting up cane Recommended continued use of the RW in the community due to imbalance. Self Care: Discussed HEP and compliance is variable due to fatigue, HA, and dizziness PT modified current HEP for activities that provoke less dizziness and reemphasized need for consistent routine  HOME EXERCISE PROGRAM Last updated: 05/22/22 Access Code: [email or web address] URL: [email or web address] Date: 05/22/2022 Prepared by: Margretta Ditty  Program Notes REMEMBER: Get up and stand in place, take a deep breath, and remember to go slow. Recumbent bike x 2 minutes every day to begin a consistent routine.  Exercises - Seated Cervical Flexion AROM  - 1 x daily - 7 x weekly - 1 sets - 3 reps - Seated Cervical Rotation AROM  - 1 x daily - 7 x weekly - 1 sets - 3 reps  PATIENT EDUCATION: Education details: HEP update, use of RW in community Person educated: Patient and his wife, [name] Education method: Explanation, Demonstration, Tactile cues, Verbal cues, and Handouts Education comprehension: verbalized understanding and returned demonstration   Below measures were taken at time of initial evaluation unless otherwise specified:  DIAGNOSTIC FINDINGS: CEREBRAL ANGIOGRAM 04/16/22: Complete resection of previously  identified left cerebellar arteriovenous malformation, without residual nidus or early venous drainage seen.  COGNITION: Overall cognitive status:  pt notes "trying very little to think"   SENSATION: WFL  COORDINATION: Alternating pronation/supination: dysmetria on L Alternating toe tap: intact B Finger to nose: L mild-mod dysmetria and bradykinesia   MUSCLE TONE: intact B   POSTURE: No Significant postural limitations  OBSERVATION: suboccipital incision well-healing and without red flag signs  PALPATION: no TTP in B shoulders  LOWER EXTREMITY ROM:     Active  Right Eval Left Eval  Ankle dorsiflexion 4 -3  Ankle plantarflexion     (Blank rows = not tested)  LOWER EXTREMITY MMT:    MMT (in sitting) Right Eval Left Eval  Hip flexion 4+ 4  Hip extension    Hip abduction 4+ 4+  Hip adduction 4+ 4+  Hip internal rotation    Hip external rotation    Knee flexion 4 4  Knee extension 4 4+  Ankle dorsiflexion 4+ 4+  Ankle plantarflexion 4+ 4+  (Blank rows = not tested)  VESTIBULAR ASSESSMENT   GENERAL OBSERVATION: pt wears bifocals most of the time     OCULOMOTOR EXAM:   Ocular Alignment:  L eye slightly elevated in orbit   Ocular ROM: No Limitations   Spontaneous Nystagmus: absent   Gaze-Induced Nystagmus: absent   Smooth Pursuits:  2-3 saccades in each direction   Saccades:  poor trajectory L,  overshooting R, slow vertical   Convergence/Divergence: 1.5 ft before diplopia    VESTIBULAR - OCULAR REFLEX:    Slow VOR: Normal performed within tolerable cervical ROM   VOR Cancellation: Corrective Saccades B   Head-Impulse Test: NT    M-CTSIB  Condition 1: Firm Surface, EO 10 Sec, Severe and inability to self-correct, required mod A and walker support to steady self    Condition 2: Firm Surface, EC NT Sec,  NT  Sway  Condition 3: Foam Surface, EO NT Sec,  NT  Sway  Condition 4: Foam Surface, EC NT Sec,  NT  Sway   GAIT: Gait pattern:  B short steps with  poor safety awareness Assistive device utilized: Walker - 2 wheeled Level of assistance: CGA  GOALS: Goals reviewed with patient? Yes  SHORT TERM GOALS: Target date: 05/13/2022  Patient to be independent with initial HEP. Baseline: HEP initiated Goal status: Not Met-- patient is having a hard time completing HEP due to HA, nausea, fatigue. *Modified 05/22/22  LONG TERM GOALS: Target date: 06/17/2022  Patient to be independent with advanced HEP. Baseline: Not yet initiated  Goal status: IN PROGRESS  Patient to demonstrate B LE strength >/=4+/5.  Baseline: See above Goal status: IN PROGRESS  Patient to report 75% improvement in HAs.  Baseline: - Goal status: IN PROGRESS  Patient to no dizziness with transfers or bed mobility.  Baseline: dizziness Goal status: IN PROGRESS  Patient to complete TUG in <14 sec with LRAD in order to decrease risk of falls.   Baseline: 31.36 sec with RW Goal status: IN PROGRESS  Patient to demonstrate 5xSTS test in <15 sec in order to decrease risk of falls.  Baseline: 31.18 sec with B armrests Goal status: IN PROGRESS  Patient to score at least 40/56 on [place] in order to decrease risk of falls.  Baseline: 23/56 Goal status: IN PROGRESS  ASSESSMENT:  CLINICAL IMPRESSION: The patient is continuing with limitations in performing carryover of activities to home. PT modified HEP to begin with more gentle habituation activities, and emphasizied slow movement and consistency at home. He notes lack of sleep hinders him during the day and this seems due to discomfort in posterior head and along incision with rolling. PT performed today in clinic and he tolerated this task well. Patient is able to ambulate with [facility] with supervision and cues to slow speed. He notes he has ordered a [facility] for home. He is currently wall walking at home and is not using device indoors. PT to continue to progress to patient tolerance recommending slower pace for energy conservation  and starting a more consistent routine. Patient tolerated today's session well.   OBJECTIVE IMPAIRMENTS: Abnormal gait, decreased balance, decreased coordination, decreased knowledge of use of DME, difficulty walking, decreased ROM, decreased strength, dizziness, and pain.   PLAN:  PT FREQUENCY: 2x/week  PT DURATION: 8 weeks  PLANNED INTERVENTIONS: Therapeutic exercises, Therapeutic activity, Neuromuscular re-education, Balance training, Gait training, Patient/Family education, Self Care, Joint mobilization, Stair training, Vestibular training, Canalith repositioning, DME instructions, Aquatic Therapy, Dry Needling, Electrical stimulation, Cryotherapy, Moist heat, Taping, Manual therapy, and Re-evaluation  PLAN FOR NEXT SESSION: monitor vitals, HEP review, plan to progress from seated head motions to gaze adaptation again when he can tolerate more activity (currently not doing regular HEP b/c of HA with activities), standing activities as tolerated.  3:20 PM, 05/22/22 Rudell Cobb, [facility] [address], [place], [place] [address] Phone # [phone])  [phone] Fax # [phone]

## 2022-05-23 ENCOUNTER — Other Ambulatory Visit: Payer: Self-pay | Admitting: *Deleted

## 2022-05-23 ENCOUNTER — Telehealth: Payer: Self-pay | Admitting: Interventional Cardiology

## 2022-05-23 DIAGNOSIS — E782 Mixed hyperlipidemia: Secondary | ICD-10-CM

## 2022-05-23 NOTE — Telephone Encounter (Signed)
Newq Message:    Patient says he needs an order ,so he can have his lab work at [facility] in [place] please.

## 2022-05-23 NOTE — Telephone Encounter (Signed)
New orders placed.  Patient's wife notified.

## 2022-05-26 ENCOUNTER — Ambulatory Visit: Payer: BC Managed Care – PPO

## 2022-05-26 ENCOUNTER — Ambulatory Visit: Payer: BC Managed Care – PPO | Admitting: Occupational Therapy
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
## 2022-05-27 ENCOUNTER — Encounter: Payer: Self-pay | Admitting: Physical Therapy

## 2022-05-27 ENCOUNTER — Other Ambulatory Visit: Payer: BC Managed Care – PPO

## 2022-05-27 ENCOUNTER — Ambulatory Visit: Payer: BC Managed Care – PPO | Admitting: Physical Therapy

## 2022-05-27 DIAGNOSIS — R42 Dizziness and giddiness: Secondary | ICD-10-CM | POA: Diagnosis not present

## 2022-05-27 DIAGNOSIS — M5459 Other low back pain: Secondary | ICD-10-CM | POA: Diagnosis not present

## 2022-05-27 DIAGNOSIS — R2681 Unsteadiness on feet: Secondary | ICD-10-CM

## 2022-05-27 DIAGNOSIS — R278 Other lack of coordination: Secondary | ICD-10-CM | POA: Diagnosis not present

## 2022-05-27 DIAGNOSIS — M6281 Muscle weakness (generalized): Secondary | ICD-10-CM

## 2022-05-27 DIAGNOSIS — R262 Difficulty in walking, not elsewhere classified: Secondary | ICD-10-CM | POA: Diagnosis not present

## 2022-05-27 DIAGNOSIS — R41842 Visuospatial deficit: Secondary | ICD-10-CM | POA: Diagnosis not present

## 2022-05-27 DIAGNOSIS — R2689 Other abnormalities of gait and mobility: Secondary | ICD-10-CM | POA: Diagnosis not present

## 2022-05-28 ENCOUNTER — Ambulatory Visit: Payer: BC Managed Care – PPO

## 2022-05-28 ENCOUNTER — Ambulatory Visit: Payer: BC Managed Care – PPO | Admitting: Occupational Therapy

## 2022-05-28 DIAGNOSIS — R2681 Unsteadiness on feet: Secondary | ICD-10-CM | POA: Diagnosis not present

## 2022-05-28 DIAGNOSIS — R41842 Visuospatial deficit: Secondary | ICD-10-CM | POA: Diagnosis not present

## 2022-05-28 DIAGNOSIS — R262 Difficulty in walking, not elsewhere classified: Secondary | ICD-10-CM | POA: Diagnosis not present

## 2022-05-28 DIAGNOSIS — R2689 Other abnormalities of gait and mobility: Secondary | ICD-10-CM

## 2022-05-28 DIAGNOSIS — M6281 Muscle weakness (generalized): Secondary | ICD-10-CM

## 2022-05-28 DIAGNOSIS — R278 Other lack of coordination: Secondary | ICD-10-CM

## 2022-05-28 DIAGNOSIS — M5459 Other low back pain: Secondary | ICD-10-CM | POA: Diagnosis not present

## 2022-05-28 DIAGNOSIS — R42 Dizziness and giddiness: Secondary | ICD-10-CM | POA: Diagnosis not present

## 2022-05-28 NOTE — Therapy (Signed)
OUTPATIENT OCCUPATIONAL THERAPY  Treatment Note  Patient Name: Christopher Yu MRN: [id number] DOB:1960-05-30, 62 y.o., male Today's Date: 05/28/2022  PCP: Kirby Funk, MD REFERRING PROVIDER: Lisbeth Renshaw, MD  END OF SESSION:        Past Medical History:  Diagnosis Date   Anxiety    Arthritis, rheumatoid (HCC)    dx 1988   CAD (coronary artery disease), native coronary artery    Mild LAD disease with calcification noted at cath 12//27/16    Cardiomyopathy [facility])    Chronic systolic heart failure (HCC) 05/01/2015   Depression    Deviated nasal septum 06/25/2011   Headache    Hyperlipidemia    Impingement syndrome of left shoulder 12/22/2017   Rheumatoid aortitis    Sleep apnea    does not use cpap - UNABLE TO TOLERATE MASK   Sleep apnea in adult    deviated septum repaired, most recent sleep study was negative   Status post total right knee replacement 06/01/2015   Past Surgical History:  Procedure Laterality Date   ANKLE FUSION Right 2014   related to his arthritis   ANKLE FUSION Left 05/25/13   related to his arthritis   APPLICATION OF CRANIAL NAVIGATION N/A 04/11/2022   Procedure: APPLICATION OF CRANIAL NAVIGATION;  Surgeon: Lisbeth Renshaw, MD;  Location: [facility] OR;  Service: Neurosurgery;  Laterality: N/A;   CARDIAC CATHETERIZATION N/A 05/01/2015   Procedure: Left Heart Cath and Coronary Angiography;  Surgeon: Lyn Records, MD;  Location: [facility] INVASIVE CV LAB;  Service: Cardiovascular;  Laterality: N/A;   CRANIOTOMY N/A 04/11/2022   Procedure: STEREOTACTIC SUBOCCIPITAL CRANIOTOMY FOR RESECTION OF ARTERIO-VENOUS MALFORMATION;  Surgeon: Lisbeth Renshaw, MD;  Location: [facility] OR;  Service: Neurosurgery;  Laterality: N/A;   FRACTURE SURGERY     left femur fracture x 3   IR ANGIO EXTERNAL CAROTID SEL EXT CAROTID BILAT MOD SED  01/28/2022   IR ANGIO INTRA EXTRACRAN SEL INTERNAL CAROTID BILAT MOD SED  01/28/2022   IR ANGIO INTRA EXTRACRAN SEL INTERNAL  CAROTID BILAT MOD SED  04/16/2022   IR ANGIO VERTEBRAL SEL VERTEBRAL BILAT MOD SED  01/28/2022   IR ANGIO VERTEBRAL SEL VERTEBRAL BILAT MOD SED  04/16/2022   KNEE ARTHROSCOPY     right x 4   NASAL SEPTOPLASTY W/ TURBINOPLASTY  06/25/2011   Procedure: NASAL SEPTOPLASTY WITH TURBINATE REDUCTION;  Surgeon: Osborn Coho, MD;  Location: [facility] OR;  Service: ENT;  Laterality: Bilateral;   TONSILLECTOMY     TOTAL KNEE ARTHROPLASTY Right 06/01/2015   Procedure: RIGHT TOTAL KNEE ARTHROPLASTY;  Surgeon: Kathryne Hitch, MD;  Location: [facility] ORS;  Service: Orthopedics;  Laterality: Right;  Block+general   Patient Active Problem List   Diagnosis Date Noted   Status post craniotomy 04/11/2022   AVM (arteriovenous malformation) brain 04/11/2022   Impingement syndrome of left shoulder 12/22/2017   Impingement syndrome of right shoulder 12/22/2017   Rheumatoid arthritis involving right knee (HCC) 06/01/2015   Status post total right knee replacement 06/01/2015   Cardiomyopathy (HCC) 05/02/2015   Hyperlipidemia    Sleep apnea in adult    Rheumatoid arthritis (HCC)    Chronic systolic heart failure (HCC) 05/01/2015   CAD (coronary artery disease), native coronary artery     ONSET DATE: 04/11/22  REFERRING DIAG: G26.948 (ICD-10-CM) - Status post craniotomy Q28.2 (ICD-10-CM) - AVM (arteriovenous malformation) brain  THERAPY DIAG:  No diagnosis found.  Rationale for Evaluation and Treatment: Rehabilitation  SUBJECTIVE:   SUBJECTIVE STATEMENT: Pt reports that  he took his morning medication at 8 and then feels that he is then taking medication to chase the headache instead of heading it off. Pt accompanied by: self and significant other  PERTINENT HISTORY:  Pt is s/p suboccipital craniotomy for resection of cerebellar AVM. PMH: RA, anxiety, CAD PSH: L TKA  PRECAUTIONS: Other: no bending, twisting, lifting > 10#  WEIGHT BEARING RESTRICTIONS:  no lifting > 10#  PAIN:  Are you having pain? Yes:  NPRS scale: 6/10 Pain location: headache Pain description: nonstop, dull/nagging but can become sharp/shooting Aggravating factors: dizziness with mobility/fatigue Relieving factors: nothing, however has been using ice packs to alleviate  FALLS: Has patient fallen in last 6 months? No  LIVING ENVIRONMENT: Lives with: lives with their family (wife, and 28 and 55 yo daughters) Lives in: House/apartment Stairs: Yes: Internal: full flight of steps to home office, however is currently not having to navigate steps; on right going up and External: 3 steps; on right going up Has following equipment at home: Gilford Rile - 2 wheeled and shower chair  PLOF: Independent, Independent with basic ADLs, Independent with household mobility without device, and Vocation/Vocational requirements: working as a Freight forwarder in [employer], requires occasional travel by plane  PATIENT GOALS: to be able to move without having to use a walker  OBJECTIVE:   HAND DOMINANCE: Right  ADLs: Transfers/ambulation related to ADLs: Min A with RW Eating: wife will assist with cutting up foods, opening items, and occasionally placing items in his hand Grooming: Supervision when standing at sink UB Dressing: Min assist LB Dressing: Mod-max assist Toileting: wife will assist with pulling up pants/hygiene, min assist for transfers off toilet Bathing: supervision/cues for bathing, wife completes 5% Tub Shower transfers: Mod-max assist transfers in/out of shower Equipment: Shower seat without back, Walk in shower, Long handled shoe horn, and Long handled sponge   MOBILITY STATUS: Needs Assist: Min A with RW  ACTIVITY TOLERANCE: Activity tolerance: diminished  FUNCTIONAL OUTCOME MEASURES: Ron Parker Index of Independence in ADLs:2/6  COORDINATION: Finger Nose Finger test: overshooting mildly with LUE 9 Hole Peg test: Right: 36.38 sec; Left: 1:00.87 sec Box and Blocks:  Right 41 blocks, Left 35 blocks  COGNITION: Overall  cognitive status: Difficulty to assess due to: flat affect and decreased engagement in tasks s/p surgery Pt reports that he has been avoiding his phone, television, and bill paying, anything that makes him think. Spouse reports he is on a lot of medications which may also be impacting his arousal/cognition.    VISION ASSESSMENT: To be further assessed in functional context Ocular ROM: WFL Tracking/Visual pursuits: Able to track stimulus in all quads without difficulty Saccades: additional eye shifts occurred during testing, decreased speed of saccadic movements, and additional eye shifts in B eyes when looking to L  Patient has difficulty with following activities due to following visual impairments: is currently not reading, looking at phone or tv  OBSERVATIONS: flat affect, closing eyes frequently   TODAY'S TREATMENT:                                                               05/28/22 5x sit > stand: 18.19 sec.  Pt demonstrating improved motor control and controlled descent.  Pt reports dizziness afterwards, reporting he typically will stand up and wait  a moment to allow everything to settle prior to moving. OT reviewing best practice to decrease fall risk and to take a pause once upright in standing to acclimate prior to ambulating.   Pipe tree puzzle: Pt tolerated standing 5.5 mins with alternating UE support on table while completing vertical pipe tree puzzle.  OT increased challenge to retrieving items with LUE to focus on motor control with reach and manipulation of PVC pieces.  Pt reports fatigue and lightheadedness post standing. 9 hole peg: Completed in 1:00 with 3 drops and increased trunk and UE ataxia.  Completed 2nd attempt in 45.38 sec with improvements in motor control and no drops.   ADL: Pt and spouse report that pt is moving around home with improved ease, however still with tendency to move too quickly sometimes and demonstrating instability.  Pt reports "shaky" when shaving  both in UE as well as trunk.  Utilizes seat intermittently to allow for rest break during grooming tasks.  Pt reports setup for clothing, with pt able to dress himself with occasional assist for socks/shoes.  Min A/ steady A for shower transfers, and would not attempt bathing if wife not present. Trunk control: engaged in supine bridging with and without ball between knees to further challenge core stability.  OT noted instability in trunk during standing tasks, possibly attributing to coordination impairments.   Educated on addressing core stability to aid in standing balance as well as LUE coordination.     05/19/22 Sleep: pt reports that he tosses and turns at night and will occasionally nap during the day if he did not get good sleep overnight.  Discussed sleep hygiene with attempting to decrease day time sleeping to allow for increased sleep overnight.  Pt reports turning off phone alerts to decrease stimulus that may awaken him at night.  Attributes restlessness to pain in head when he rolls over. Balance: Pt reports safety awareness with being able to walk around the kitchen without RW, however holding on to counter throughout kitchen.  Dynamic standing to engage in pipe tree puzzle pattern replication in standing.  Pt completing while maintaining standing 3 mins before requiring seated rest break.  Pt demonstrating mild balance perturbations without UE support, but no LOB or reports of lightheadedness or dizziness.   Coordination: Noted ataxia in LUE when attempting pipe tree puzzle connections with LUE, frequently overshooting.  Noted increased ataxia with horizontal movements vs vertical movements.  OT did note and educated on decreased ataxia with elbow proximity to torso. 9 hole peg test: L: 1:01.41 sec.  Pt continues to demonstrate decreased motor control and ataxia, and difficulty placing pegs into peg holes.   05/12/22 Pt asking questions about his low BP.  Pt reports that his cardiologist  discontinued one of his meds to aid in this.  Discussed positional changes with increased upright/out of bed positioning as well as possibility of wearing compression stockings for BP management. Coordination: small peg board pattern replication with focus on motor control and manipulation of small items.  Pt unable to see numbers on grid, therefore modified task to completing with colored dots to decrease visual and cognitive challenge.  Pt dropping 20% of pegs.  Pt able to recognize that an error occurred but with increased difficulty to recognize and correct error. OT increased challenge to completing in-hand manipulation and translation of pegs from finger tips <> palm when removing pegs.  Pt demonstrating improved motor control with translation compared to one at a time placement into peg board.  Coordination: w/ LUE including: flipping cards one at a time off a deck, picking up various small objects/coins, picking up a small object and translating palm-to-fingertips, picking up coins and stacking them, and picking up 5-10 coins 1 at a time and translating palm to fingertips to place in coin slot.  Pt demonstrating most difficulty with stacking and unstacking coins.  Provided with HEP of above exercises and encouraged engagement in these tasks at home.   PATIENT EDUCATION: Education details: ongoing condition specific education Person educated: Patient and Spouse Education method: Explanation Education comprehension: verbalized understanding and needs further education  HOME EXERCISE PROGRAM: Coordination HEP   GOALS: Goals reviewed with patient? Yes  SHORT TERM GOALS: Target date: 05/23/22  Pt will be independent in HEP for fine and gross motor control. Baseline: Goal status: IN PROGRESS  2.  Pt will verbalize understanding of adaptive strategies, task modifications, and/or potential A/E needs to increase ease, safety, and independence w/ ADLs. Baseline:  Goal status: MET -  05/28/22  3.  Pt will demonstrate improved ease with sit > stand to get up from toilet or other seating options in home by improving score on sit > stand to decreased fall risk.  Baseline: time TBD Goal status: MET - 18.19 sec on 05/28/22 and pt and spouse report improved mobility in home  4.  Pt will perform dynamic standing task for 5 mins as needed for simple IADLs w/o LOB using DME and/or countertop support prn   Baseline:  Goal status: MET - 05/28/22  5.  Pt will demonstrate improved fine motor coordination for ADLs as evidenced by decreasing 9 hole peg test score for LUE by 3 secs  Baseline:9 Hole Peg test: Right: 36.38 sec; Left: 1:00.87 sec  Goal status: MET - 52.7 sec on 05/28/22   LONG TERM GOALS: Target date: 06/20/22  Pt will demonstrate shower transfers with DME PRN at supervision level. Baseline: currently sponge bathing due to fearfulness of showering, but wants to return to showers.   Goal status: IN PROGRESS   Pt will report increased participation in IADLs (such as cooking) to increase independence with functional IADLs.  Baseline:  Goal status: IN PROGRESS   Pt will complete UB dressing, to include clothing fasteners, at Mod I level with use of AE and/or alternative strategies PRN. Baseline:  Goal status: IN PROGRESS   Pt will complete LB dressing with Supervision/setup with use of AE and/or alternative strategies PRN. Baseline:  Goal status: IN PROGRESS   Pt will demonstrate improved fine motor coordination for ADLs as evidenced by decreasing 9 hole peg test score for LUE by 10 secs to increase participation in medication management. Baseline: Goal status: IN PROGRESS  ASSESSMENT:  CLINICAL IMPRESSION: Pt and spouse asking questions about persistent swelling at surgical site, OT deferred back to surgeon in regards to best treatment.  Pt tolerated increased upright sitting and standing activity this session with min instability and fatigue.  Pt continues to  demonstrate coordination impairments with LUE, both fine and gross motor control with small pegs and when completing pipe tree pattern.  Reiterated possible proximity of elbows to torso to minimize gross motor ataxia as well as focus on trunk/core stability with supine exercises to carry over to standing activities.  PERFORMANCE DEFICITS: in functional skills including ADLs, IADLs, coordination, pain, Fine motor control, Gross motor control, balance, body mechanics, endurance, decreased knowledge of precautions, decreased knowledge of use of DME, vision, and UE functional use, cognitive skills including energy/drive, problem solving,  and thought, and psychosocial skills including environmental adaptation, habits, and routines and behaviors.   IMPAIRMENTS: are limiting patient from ADLs and IADLs.   CO-MORBIDITIES: may have co-morbidities  that affects occupational performance. Patient will benefit from skilled OT to address above impairments and improve overall function.  MODIFICATION OR ASSISTANCE TO COMPLETE EVALUATION: Min-Moderate modification of tasks or assist with assess necessary to complete an evaluation.  OT OCCUPATIONAL PROFILE AND HISTORY: Detailed assessment: Review of records and additional review of physical, cognitive, psychosocial history related to current functional performance.  CLINICAL DECISION MAKING: Moderate - several treatment options, min-mod task modification necessary  REHAB POTENTIAL: Good  EVALUATION COMPLEXITY: Moderate    PLAN:  OT FREQUENCY: 2x/week  OT DURATION: 8 weeks  PLANNED INTERVENTIONS: self care/ADL training, therapeutic exercise, therapeutic activity, neuromuscular re-education, balance training, functional mobility training, compression bandaging, moist heat, cryotherapy, patient/family education, cognitive remediation/compensation, visual/perceptual remediation/compensation, psychosocial skills training, energy conservation, coping strategies  training, and DME and/or AE instructions  RECOMMENDED OTHER SERVICES: NA  CONSULTED AND AGREED WITH PLAN OF CARE: Patient and family member/caregiver  PLAN FOR NEXT SESSION: Initiate vision exercises (for saccades), educate on AE/DME for LB bathing/dressing, functional transfers for shower transfers, coordination, dynamic standing   Chrishawn Boley, [place], OTR/L 05/28/2022, 12:27 PM

## 2022-05-29 ENCOUNTER — Ambulatory Visit: Payer: BC Managed Care – PPO | Admitting: Rehabilitative and Restorative Service Providers"

## 2022-05-29 ENCOUNTER — Encounter: Payer: Self-pay | Admitting: Rehabilitative and Restorative Service Providers"

## 2022-05-29 DIAGNOSIS — R2689 Other abnormalities of gait and mobility: Secondary | ICD-10-CM | POA: Diagnosis not present

## 2022-05-29 DIAGNOSIS — R2681 Unsteadiness on feet: Secondary | ICD-10-CM

## 2022-05-29 DIAGNOSIS — R262 Difficulty in walking, not elsewhere classified: Secondary | ICD-10-CM | POA: Diagnosis not present

## 2022-05-29 DIAGNOSIS — R278 Other lack of coordination: Secondary | ICD-10-CM | POA: Diagnosis not present

## 2022-05-29 DIAGNOSIS — M6281 Muscle weakness (generalized): Secondary | ICD-10-CM | POA: Diagnosis not present

## 2022-05-29 DIAGNOSIS — M5459 Other low back pain: Secondary | ICD-10-CM | POA: Diagnosis not present

## 2022-05-29 DIAGNOSIS — R42 Dizziness and giddiness: Secondary | ICD-10-CM | POA: Diagnosis not present

## 2022-05-29 DIAGNOSIS — R41842 Visuospatial deficit: Secondary | ICD-10-CM | POA: Diagnosis not present

## 2022-05-29 NOTE — Therapy (Signed)
OUTPATIENT PHYSICAL THERAPY NEURO TREATMENT   Patient Name: Christopher Yu MRN: [id number] DOB:1960/12/05, 62 y.o., male Today's Date: 05/29/2022   PCP: Lavone Orn, MD  REFERRING PROVIDER: Consuella Lose, MD  END OF SESSION:  PT End of Session - 05/29/22 1022     Visit Number 10    Number of Visits 17    Date for PT Re-Evaluation 06/17/22    Authorization Type [employer]    Authorization - Number of Visits 42   30 PT and OT combined   PT Start Time 1022    PT Stop Time 1100    PT Time Calculation (min) 38 min    Equipment Utilized During Treatment Gait belt    Activity Tolerance Patient tolerated treatment well    Behavior During Therapy WFL for tasks assessed/performed              Past Medical History:  Diagnosis Date   Anxiety    Arthritis, rheumatoid ([place])    dx 1988   CAD (coronary artery disease), native coronary artery    Mild LAD disease with calcification noted at cath 12//27/16    Cardiomyopathy [facility])    Chronic systolic heart failure ([place]) 05/01/2015   Depression    Deviated nasal septum 06/25/2011   Headache    Hyperlipidemia    Impingement syndrome of left shoulder 12/22/2017   Rheumatoid aortitis    Sleep apnea    does not use cpap - UNABLE TO TOLERATE MASK   Sleep apnea in adult    deviated septum repaired, most recent sleep study was negative   Status post total right knee replacement 06/01/2015   Past Surgical History:  Procedure Laterality Date   ANKLE FUSION Right 2014   related to his arthritis   ANKLE FUSION Left 05/25/13   related to his arthritis   APPLICATION OF CRANIAL NAVIGATION N/A 04/11/2022   Procedure: APPLICATION OF CRANIAL NAVIGATION;  Surgeon: Consuella Lose, MD;  Location: [facility];  Service: Neurosurgery;  Laterality: N/A;   CARDIAC CATHETERIZATION N/A 05/01/2015   Procedure: Left Heart Cath and Coronary Angiography;  Surgeon: Belva Crome, MD;  Location: [facility];  Service: Cardiovascular;   Laterality: N/A;   CRANIOTOMY N/A 04/11/2022   Procedure: STEREOTACTIC SUBOCCIPITAL CRANIOTOMY FOR RESECTION OF ARTERIO-VENOUS MALFORMATION;  Surgeon: Consuella Lose, MD;  Location: [employer];  Service: Neurosurgery;  Laterality: N/A;   FRACTURE SURGERY     left femur fracture x 3   IR ANGIO EXTERNAL CAROTID SEL EXT CAROTID BILAT MOD SED  01/28/2022   IR ANGIO INTRA EXTRACRAN SEL INTERNAL CAROTID BILAT MOD SED  01/28/2022   IR ANGIO INTRA EXTRACRAN SEL INTERNAL CAROTID BILAT MOD SED  04/16/2022   IR ANGIO VERTEBRAL SEL VERTEBRAL BILAT MOD SED  01/28/2022   IR ANGIO VERTEBRAL SEL VERTEBRAL BILAT MOD SED  04/16/2022   KNEE ARTHROSCOPY     right x 4   NASAL SEPTOPLASTY W/ TURBINOPLASTY  06/25/2011   Procedure: NASAL SEPTOPLASTY WITH TURBINATE REDUCTION;  Surgeon: Jerrell Belfast, MD;  Location: [facility];  Service: ENT;  Laterality: Bilateral;   TONSILLECTOMY     TOTAL KNEE ARTHROPLASTY Right 06/01/2015   Procedure: RIGHT TOTAL KNEE ARTHROPLASTY;  Surgeon: Mcarthur Rossetti, MD;  Location: [facility];  Service: Orthopedics;  Laterality: Right;  Block+general   Patient Active Problem List   Diagnosis Date Noted   Status post craniotomy 04/11/2022   AVM (arteriovenous malformation) brain 04/11/2022   Impingement syndrome of left shoulder 12/22/2017  Impingement syndrome of right shoulder 12/22/2017   Rheumatoid arthritis involving right knee ([place]) 06/01/2015   Status post total right knee replacement 06/01/2015   Cardiomyopathy ([place]) 05/02/2015   Hyperlipidemia    Sleep apnea in adult    Rheumatoid arthritis ([place])    Chronic systolic heart failure ([place]) 05/01/2015   CAD (coronary artery disease), native coronary artery     ONSET DATE: 04/11/22  REFERRING DIAG: [id number] (ICD-10-CM) - Status post craniotomy Q28.2 (ICD-10-CM) - AVM (arteriovenous malformation) brain  THERAPY DIAG:  Unsteadiness on feet  Muscle weakness (generalized)  Other abnormalities of gait and mobility  Rationale  for Evaluation and Treatment: Rehabilitation  SUBJECTIVE:                                                                                                                                                                                             SUBJECTIVE STATEMENT: Pt notes "not my best day". He reports not sleeping well, feeling nausea and having a HA today. Pt accompanied by: significant other  PERTINENT HISTORY: Anxiety, RA, CAD, cardiomyopathy, chronic systolic failure, depression, HA, HLD, R TKA 2017, B ankle fusion 2014/2015, L femur fx surgery x3, craniotomy with resection of AVM 04/11/22  PAIN:  Are you having pain? Yes: NPRS scale: 4/10 Pain location: Headache Pain description: headache Aggravating factors: movement, light, noise Relieving factors: trying to rest, dark, quiet room  PRECAUTIONS: Other: per hospital DC note: No lifting >10 lbs, No bending/twisting at waist   WEIGHT BEARING RESTRICTIONS: No  FALLS: Has patient fallen in last 6 months? No  LIVING ENVIRONMENT: Lives with: lives with their spouse Lives in: House/apartment Stairs:  2 story house with 3 steps to enter with handrail on R Has following equipment at home: Environmental consultant - 2 wheeled and Shower bench  PLOF: Independent; [place] in home office which is on 2nd floor; golf  PATIENT GOALS: improve stability   OBJECTIVE:   [place] [facility] Adult PT Treatment:                                                DATE: 05/29/2022 Therapeutic Exercise: Nu-step level 4 x 3 minutes Les only  Sit<>stand x 5 reps without UE support with increasing HA and dizziness Supine Trunk rotation adding head motion x 8 reps without dizziness Bridges x 10 reps Bridges with marching x 5 reps R and L Dizziness 5/10 and HA 6/10 after supine exercises and pressure in his head is increasing (to top and front); sat  and let symptoms settle Neuromuscular re-ed: Habituation cervical flexion to neutral x 4 reps cervical extension to neutral x 4  reps Combined flexion and extension x 5 reps Dizziness rated 5/10 after vertical head movements Standing head motion Oculomotor=moving between 2 targets in horizontal plane, tracking horizontal/vertical for smooth pursuits (did while resting-- no increase in symptoms-- continues with overshooting on saccadic eye movements) Foam standing with eyes open, eyes closed, rocker board with board positioned with L lateral lean working with eyes open and eyes closed with CGA to min A Marching in place Eyes open/eyes closed with narrowing base of support and CGA to SBA After standing has 5/10 HA Gait: Figure 8 walking with CGA x 4 reps without increased dizziness Gait with RW into clinic x 50 feet with cues for slower pace Gait without device in clinic with CGA   PATIENT EDUCATION: Education details: handout provided for safe stair sequencing, encouraged continued SPC use indoor and RW outdoors for max safety an energy conservation; discussion on finding right amount of activity to avoid extreme exacerbation of symptoms  Person educated: Patient Education method: Explanation, Demonstration, Tactile cues, Verbal cues, and Handouts Education comprehension: verbalized understanding and returned demonstration  HOME EXERCISE PROGRAM Last updated: 05/22/22 Access Code: [email or web address] URL: [email or web address] Date: 05/22/2022 Prepared by: Rudell Cobb  Program Notes REMEMBER: Get up and stand in place, take a deep breath, and remember to go slow. Recumbent bike x 2 minutes every day to begin a consistent routine.  Exercises - Seated Cervical Flexion AROM  - 1 x daily - 7 x weekly - 1 sets - 3 reps - Seated Cervical Rotation AROM  - 1 x daily - 7 x weekly - 1 sets - 3 reps   Below measures were taken at time of initial evaluation unless otherwise specified:  DIAGNOSTIC FINDINGS: CEREBRAL ANGIOGRAM 04/16/22: Complete resection of previously identified left cerebellar arteriovenous  malformation, without residual nidus or early venous drainage seen.  COGNITION: Overall cognitive status:  pt notes "trying very little to think"   SENSATION: WFL  COORDINATION: Alternating pronation/supination: dysmetria on L Alternating toe tap: intact B Finger to nose: L mild-mod dysmetria and bradykinesia   MUSCLE TONE: intact B   POSTURE: No Significant postural limitations  OBSERVATION: suboccipital incision well-healing and without red flag signs  PALPATION: no TTP in B shoulders  LOWER EXTREMITY ROM:     Active  Right Eval Left Eval  Ankle dorsiflexion 4 -3  Ankle plantarflexion     (Blank rows = not tested)  LOWER EXTREMITY MMT:    MMT (in sitting) Right Eval Left Eval  Hip flexion 4+ 4  Hip extension    Hip abduction 4+ 4+  Hip adduction 4+ 4+  Hip internal rotation    Hip external rotation    Knee flexion 4 4  Knee extension 4 4+  Ankle dorsiflexion 4+ 4+  Ankle plantarflexion 4+ 4+  (Blank rows = not tested)  VESTIBULAR ASSESSMENT   GENERAL OBSERVATION: pt wears bifocals most of the time     OCULOMOTOR EXAM:   Ocular Alignment:  L eye slightly elevated in orbit   Ocular ROM: No Limitations   Spontaneous Nystagmus: absent   Gaze-Induced Nystagmus: absent   Smooth Pursuits:  2-3 saccades in each direction   Saccades:  poor trajectory L, overshooting R, slow vertical   Convergence/Divergence: 1.5 ft before diplopia    VESTIBULAR - OCULAR REFLEX:    Slow VOR: Normal performed within tolerable cervical ROM   VOR  Cancellation: Corrective Saccades B   Head-Impulse Test: NT    M-CTSIB  Condition 1: Firm Surface, EO 10 Sec, Severe and inability to self-correct, required mod A and walker support to steady self    Condition 2: Firm Surface, EC NT Sec,  NT  Sway  Condition 3: Foam Surface, EO NT Sec,  NT  Sway  Condition 4: Foam Surface, EC NT Sec,  NT  Sway   GAIT: Gait pattern:  B short steps with poor safety awareness Assistive device  utilized: Walker - 2 wheeled Level of assistance: CGA  GOALS: Goals reviewed with patient? Yes  SHORT TERM GOALS: Target date: 05/13/2022  Patient to be independent with initial HEP. Baseline: HEP initiated Goal status: Not Met-- patient is having a hard time completing HEP due to HA, nausea, fatigue. *Modified 05/22/22  LONG TERM GOALS: Target date: 06/17/2022  Patient to be independent with advanced HEP. Baseline: Not yet initiated  Goal status: IN PROGRESS  Patient to demonstrate B LE strength >/=4+/5.  Baseline: See above Goal status: IN PROGRESS  Patient to report 75% improvement in HAs.  Baseline: - Goal status: IN PROGRESS  Patient to no dizziness with transfers or bed mobility.  Baseline: dizziness Goal status: IN PROGRESS  Patient to complete TUG in <14 sec with LRAD in order to decrease risk of falls.   Baseline: 31.36 sec with RW Goal status: IN PROGRESS  Patient to demonstrate 5xSTS test in <15 sec in order to decrease risk of falls.  Baseline: 31.18 sec with B armrests Goal status: IN PROGRESS  Patient to score at least 40/56 on Berg in order to decrease risk of falls.  Baseline: 23/56 Goal status: IN PROGRESS  ASSESSMENT:  CLINICAL IMPRESSION: The patient not feeling well at start of session, but tolerated activities. PT worked to vary tasks to avoid increasing dizziness t/o session taking breaks for supine exercises and multi-sensory balance activities. The patient was able to demo adaptation to L pulling with balance challenges with practice in session.  OBJECTIVE IMPAIRMENTS: Abnormal gait, decreased balance, decreased coordination, decreased knowledge of use of DME, difficulty walking, decreased ROM, decreased strength, dizziness, and pain.   PLAN:  PT FREQUENCY: 2x/week  PT DURATION: 8 weeks  PLANNED INTERVENTIONS: Therapeutic exercises, Therapeutic activity, Neuromuscular re-education, Balance training, Gait training, Patient/Family education,  Self Care, Joint mobilization, Stair training, Vestibular training, Canalith repositioning, DME instructions, Aquatic Therapy, Dry Needling, Electrical stimulation, Cryotherapy, Moist heat, Taping, Manual therapy, and Re-evaluation  PLAN FOR NEXT SESSION: plan to progress from seated head motions to gaze adaptation again when he can tolerate more activity (currently not doing regular HEP b/c of HA with activities), standing activities as tolerated; gait training with cane  Dandria Griego, PT 05/29/22 10:26 AM  [facility] at [facility] [address], [address] [place], [place] [address] Phone # [phone] Fax # [phone]

## 2022-05-30 ENCOUNTER — Ambulatory Visit: Payer: BC Managed Care – PPO | Admitting: Occupational Therapy

## 2022-05-30 DIAGNOSIS — R262 Difficulty in walking, not elsewhere classified: Secondary | ICD-10-CM | POA: Diagnosis not present

## 2022-05-30 DIAGNOSIS — M6281 Muscle weakness (generalized): Secondary | ICD-10-CM

## 2022-05-30 DIAGNOSIS — R41842 Visuospatial deficit: Secondary | ICD-10-CM

## 2022-05-30 DIAGNOSIS — R2681 Unsteadiness on feet: Secondary | ICD-10-CM | POA: Diagnosis not present

## 2022-05-30 DIAGNOSIS — R278 Other lack of coordination: Secondary | ICD-10-CM

## 2022-05-30 DIAGNOSIS — M5459 Other low back pain: Secondary | ICD-10-CM | POA: Diagnosis not present

## 2022-05-30 DIAGNOSIS — R2689 Other abnormalities of gait and mobility: Secondary | ICD-10-CM | POA: Diagnosis not present

## 2022-05-30 DIAGNOSIS — R42 Dizziness and giddiness: Secondary | ICD-10-CM | POA: Diagnosis not present

## 2022-05-30 NOTE — Therapy (Signed)
OUTPATIENT OCCUPATIONAL THERAPY  Treatment Note  Patient Name: Christopher Yu MRN: [id number] DOB:08-25-1960, 62 y.o., male Today's Date: 05/30/2022  PCP: Kirby Funk, MD REFERRING PROVIDER: Lisbeth Renshaw, MD  END OF SESSION:        Past Medical History:  Diagnosis Date   Anxiety    Arthritis, rheumatoid (HCC)    dx 1988   CAD (coronary artery disease), native coronary artery    Mild LAD disease with calcification noted at cath 12//27/16    Cardiomyopathy [facility])    Chronic systolic heart failure (HCC) 05/01/2015   Depression    Deviated nasal septum 06/25/2011   Headache    Hyperlipidemia    Impingement syndrome of left shoulder 12/22/2017   Rheumatoid aortitis    Sleep apnea    does not use cpap - UNABLE TO TOLERATE MASK   Sleep apnea in adult    deviated septum repaired, most recent sleep study was negative   Status post total right knee replacement 06/01/2015   Past Surgical History:  Procedure Laterality Date   ANKLE FUSION Right 2014   related to his arthritis   ANKLE FUSION Left 05/25/13   related to his arthritis   APPLICATION OF CRANIAL NAVIGATION N/A 04/11/2022   Procedure: APPLICATION OF CRANIAL NAVIGATION;  Surgeon: Lisbeth Renshaw, MD;  Location: [facility] OR;  Service: Neurosurgery;  Laterality: N/A;   CARDIAC CATHETERIZATION N/A 05/01/2015   Procedure: Left Heart Cath and Coronary Angiography;  Surgeon: Lyn Records, MD;  Location: [facility] INVASIVE CV LAB;  Service: Cardiovascular;  Laterality: N/A;   CRANIOTOMY N/A 04/11/2022   Procedure: STEREOTACTIC SUBOCCIPITAL CRANIOTOMY FOR RESECTION OF ARTERIO-VENOUS MALFORMATION;  Surgeon: Lisbeth Renshaw, MD;  Location: [facility] OR;  Service: Neurosurgery;  Laterality: N/A;   FRACTURE SURGERY     left femur fracture x 3   IR ANGIO EXTERNAL CAROTID SEL EXT CAROTID BILAT MOD SED  01/28/2022   IR ANGIO INTRA EXTRACRAN SEL INTERNAL CAROTID BILAT MOD SED  01/28/2022   IR ANGIO INTRA EXTRACRAN SEL INTERNAL  CAROTID BILAT MOD SED  04/16/2022   IR ANGIO VERTEBRAL SEL VERTEBRAL BILAT MOD SED  01/28/2022   IR ANGIO VERTEBRAL SEL VERTEBRAL BILAT MOD SED  04/16/2022   KNEE ARTHROSCOPY     right x 4   NASAL SEPTOPLASTY W/ TURBINOPLASTY  06/25/2011   Procedure: NASAL SEPTOPLASTY WITH TURBINATE REDUCTION;  Surgeon: Osborn Coho, MD;  Location: [facility] OR;  Service: ENT;  Laterality: Bilateral;   TONSILLECTOMY     TOTAL KNEE ARTHROPLASTY Right 06/01/2015   Procedure: RIGHT TOTAL KNEE ARTHROPLASTY;  Surgeon: Kathryne Hitch, MD;  Location: [facility] ORS;  Service: Orthopedics;  Laterality: Right;  Block+general   Patient Active Problem List   Diagnosis Date Noted   Status post craniotomy 04/11/2022   AVM (arteriovenous malformation) brain 04/11/2022   Impingement syndrome of left shoulder 12/22/2017   Impingement syndrome of right shoulder 12/22/2017   Rheumatoid arthritis involving right knee (HCC) 06/01/2015   Status post total right knee replacement 06/01/2015   Cardiomyopathy (HCC) 05/02/2015   Hyperlipidemia    Sleep apnea in adult    Rheumatoid arthritis (HCC)    Chronic systolic heart failure (HCC) 05/01/2015   CAD (coronary artery disease), native coronary artery     ONSET DATE: 04/11/22  REFERRING DIAG: N56.213 (ICD-10-CM) - Status post craniotomy Q28.2 (ICD-10-CM) - AVM (arteriovenous malformation) brain  THERAPY DIAG:  No diagnosis found.  Rationale for Evaluation and Treatment: Rehabilitation  SUBJECTIVE:   SUBJECTIVE STATEMENT: Pt reports that  he took his morning medication at 8 and then feels that he is then taking medication to chase the headache instead of heading it off. Pt accompanied by: self and significant other  PERTINENT HISTORY:  Pt is s/p suboccipital craniotomy for resection of cerebellar AVM. PMH: RA, anxiety, CAD PSH: L TKA  PRECAUTIONS: Other: no bending, twisting, lifting > 10#  WEIGHT BEARING RESTRICTIONS:  no lifting > 10#  PAIN:  Are you having pain? Yes:  NPRS scale: 6/10 Pain location: headache Pain description: nonstop, dull/nagging but can become sharp/shooting Aggravating factors: dizziness with mobility/fatigue Relieving factors: nothing, however has been using ice packs to alleviate  FALLS: Has patient fallen in last 6 months? No  LIVING ENVIRONMENT: Lives with: lives with their family (wife, and 87 and 77 yo daughters) Lives in: House/apartment Stairs: Yes: Internal: full flight of steps to home office, however is currently not having to navigate steps; on right going up and External: 3 steps; on right going up Has following equipment at home: Gilford Rile - 2 wheeled and shower chair  PLOF: Independent, Independent with basic ADLs, Independent with household mobility without device, and Vocation/Vocational requirements: working as a Freight forwarder in [employer], requires occasional travel by plane  PATIENT GOALS: to be able to move without having to use a walker  OBJECTIVE:   HAND DOMINANCE: Right  ADLs: Transfers/ambulation related to ADLs: Min A with RW Eating: wife will assist with cutting up foods, opening items, and occasionally placing items in his hand Grooming: Supervision when standing at sink UB Dressing: Min assist LB Dressing: Mod-max assist Toileting: wife will assist with pulling up pants/hygiene, min assist for transfers off toilet Bathing: supervision/cues for bathing, wife completes 5% Tub Shower transfers: Mod-max assist transfers in/out of shower Equipment: Shower seat without back, Walk in shower, Long handled shoe horn, and Long handled sponge   MOBILITY STATUS: Needs Assist: Min A with RW  ACTIVITY TOLERANCE: Activity tolerance: diminished  FUNCTIONAL OUTCOME MEASURES: Ron Parker Index of Independence in ADLs:2/6  COORDINATION: Finger Nose Finger test: overshooting mildly with LUE 9 Hole Peg test: Right: 36.38 sec; Left: 1:00.87 sec Box and Blocks:  Right 41 blocks, Left 35 blocks  COGNITION: Overall  cognitive status: Difficulty to assess due to: flat affect and decreased engagement in tasks s/p surgery Pt reports that he has been avoiding his phone, television, and bill paying, anything that makes him think. Spouse reports he is on a lot of medications which may also be impacting his arousal/cognition.    VISION ASSESSMENT: To be further assessed in functional context Ocular ROM: WFL Tracking/Visual pursuits: Able to track stimulus in all quads without difficulty Saccades: additional eye shifts occurred during testing, decreased speed of saccadic movements, and additional eye shifts in B eyes when looking to L  Patient has difficulty with following activities due to following visual impairments: is currently not reading, looking at phone or tv  OBSERVATIONS: flat affect, closing eyes frequently   TODAY'S TREATMENT:                                                               05/30/22 Shower transfers: Pt reports that he is getting in the shower with Min- steady assist when stepping over shower ledge and then supervision for bathing.  Pt reports  that he starts out standing, but then has shower chair to sit as he fatigues.  Pt states that he stands to wash his hair standing, but then sits when applying soap to wash cloth and then remains seated.   Cup stacking: engaged in stacking cups in pyramid shape with LUE with focus on gross motor control.  Pt completing initial pyramid stack with good effort and only dropping one.  OT then increased challenge to rotating cups in finger tips prior to stacking. Pt then dropping x3 due to increased gross and fine motor control challenge.  Incorporated reaching across midline and out to L side to challenge weight shifting and functional reach.   Ball toss: toss/catch with ipsilateral LUE as well as tossing R <> L UE.  OT educating on motor control as well as visual component to task.  Pt demonstrating improved motor control and decreased drops with use of  "juggling" ball due to increased weight for additional proprioception during toss/catch. BUE use: engaged in chest pass with soccer ball with focus on symmetry with tossing.  OT increasing challenge by moving to facilitate grading of movement and motor control when tossing/catching.  Engaged in folding towels with focus on use of BUE in tandem and with reciprocal movements.       05/28/22 5x sit > stand: 18.19 sec.  Pt demonstrating improved motor control and controlled descent.  Pt reports dizziness afterwards, reporting he typically will stand up and wait a moment to allow everything to settle prior to moving. OT reviewing best practice to decrease fall risk and to take a pause once upright in standing to acclimate prior to ambulating.   Pipe tree puzzle: Pt tolerated standing 5.5 mins with alternating UE support on table while completing vertical pipe tree puzzle.  OT increased challenge to retrieving items with LUE to focus on motor control with reach and manipulation of PVC pieces.  Pt reports fatigue and lightheadedness post standing. 9 hole peg: Completed in 1:00 with 3 drops and increased trunk and UE ataxia.  Completed 2nd attempt in 45.38 sec with improvements in motor control and no drops.   ADL: Pt and spouse report that pt is moving around home with improved ease, however still with tendency to move too quickly sometimes and demonstrating instability.  Pt reports "shaky" when shaving both in UE as well as trunk.  Utilizes seat intermittently to allow for rest break during grooming tasks.  Pt reports setup for clothing, with pt able to dress himself with occasional assist for socks/shoes.  Min A/ steady A for shower transfers, and would not attempt bathing if wife not present. Trunk control: engaged in supine bridging with and without ball between knees to further challenge core stability.  OT noted instability in trunk during standing tasks, possibly attributing to coordination impairments.    Educated on addressing core stability to aid in standing balance as well as LUE coordination.     05/19/22 Sleep: pt reports that he tosses and turns at night and will occasionally nap during the day if he did not get good sleep overnight.  Discussed sleep hygiene with attempting to decrease day time sleeping to allow for increased sleep overnight.  Pt reports turning off phone alerts to decrease stimulus that may awaken him at night.  Attributes restlessness to pain in head when he rolls over. Balance: Pt reports safety awareness with being able to walk around the kitchen without RW, however holding on to counter throughout kitchen.  Dynamic standing to  engage in pipe tree puzzle pattern replication in standing.  Pt completing while maintaining standing 3 mins before requiring seated rest break.  Pt demonstrating mild balance perturbations without UE support, but no LOB or reports of lightheadedness or dizziness.   Coordination: Noted ataxia in LUE when attempting pipe tree puzzle connections with LUE, frequently overshooting.  Noted increased ataxia with horizontal movements vs vertical movements.  OT did note and educated on decreased ataxia with elbow proximity to torso. 9 hole peg test: L: 1:01.41 sec.  Pt continues to demonstrate decreased motor control and ataxia, and difficulty placing pegs into peg holes.   PATIENT EDUCATION: Education details: ongoing condition specific education Person educated: Patient and Spouse Education method: Explanation Education comprehension: verbalized understanding and needs further education  HOME EXERCISE PROGRAM: Coordination HEP  Functional tasks for LUE/BUE (see pt instructions)   GOALS: Goals reviewed with patient? Yes  SHORT TERM GOALS: Target date: 05/23/22  Pt will be independent in HEP for fine and gross motor control. Baseline: Goal status: IN PROGRESS  2.  Pt will verbalize understanding of adaptive strategies, task modifications, and/or  potential A/E needs to increase ease, safety, and independence w/ ADLs. Baseline:  Goal status: MET - 05/28/22  3.  Pt will demonstrate improved ease with sit > stand to get up from toilet or other seating options in home by improving score on sit > stand to decreased fall risk.  Baseline: time TBD Goal status: MET - 18.19 sec on 05/28/22 and pt and spouse report improved mobility in home  4.  Pt will perform dynamic standing task for 5 mins as needed for simple IADLs w/o LOB using DME and/or countertop support prn   Baseline:  Goal status: MET - 05/28/22  5.  Pt will demonstrate improved fine motor coordination for ADLs as evidenced by decreasing 9 hole peg test score for LUE by 3 secs  Baseline:9 Hole Peg test: Right: 36.38 sec; Left: 1:00.87 sec  Goal status: MET - 52.7 sec on 05/28/22   LONG TERM GOALS: Target date: 06/20/22  Pt will demonstrate shower transfers with DME PRN at supervision level. Baseline: currently sponge bathing due to fearfulness of showering, but wants to return to showers.   Goal status: IN PROGRESS - Min - Steady A for transfers on 05/30/22   Pt will report increased participation in IADLs (such as cooking) to increase independence with functional IADLs.  Baseline:  Goal status: IN PROGRESS   Pt will complete UB dressing, to include clothing fasteners, at Mod I level with use of AE and/or alternative strategies PRN. Baseline:  Goal status: IN PROGRESS   Pt will complete LB dressing with Supervision/setup with use of AE and/or alternative strategies PRN. Baseline:  Goal status: IN PROGRESS   Pt will demonstrate improved fine motor coordination for ADLs as evidenced by decreasing 9 hole peg test score for LUE by 10 secs to increase participation in medication management. Baseline: Goal status: IN PROGRESS  ASSESSMENT:  CLINICAL IMPRESSION: Pt and spouse reporting decreased engagement in [facility] activities at home, therefore focus placed on increased functional  tasks and/or tasks similar to preferred leisure tasks (golf). Pt continues to demonstrate coordination impairments with LUE, particularly gross motor control this session with stacking/unstacking of cups and ball toss.  Pt demonstrating overflow movements with LUE when tossing ball.    PERFORMANCE DEFICITS: in functional skills including ADLs, IADLs, coordination, pain, Fine motor control, Gross motor control, balance, body mechanics, endurance, decreased knowledge of precautions, decreased knowledge  of use of DME, vision, and UE functional use, cognitive skills including energy/drive, problem solving, and thought, and psychosocial skills including environmental adaptation, habits, and routines and behaviors.   IMPAIRMENTS: are limiting patient from ADLs and IADLs.   CO-MORBIDITIES: may have co-morbidities  that affects occupational performance. Patient will benefit from skilled OT to address above impairments and improve overall function.  MODIFICATION OR ASSISTANCE TO COMPLETE EVALUATION: Min-Moderate modification of tasks or assist with assess necessary to complete an evaluation.  OT OCCUPATIONAL PROFILE AND HISTORY: Detailed assessment: Review of records and additional review of physical, cognitive, psychosocial history related to current functional performance.  CLINICAL DECISION MAKING: Moderate - several treatment options, min-mod task modification necessary  REHAB POTENTIAL: Good  EVALUATION COMPLEXITY: Moderate    PLAN:  OT FREQUENCY: 2x/week  OT DURATION: 8 weeks  PLANNED INTERVENTIONS: self care/ADL training, therapeutic exercise, therapeutic activity, neuromuscular re-education, balance training, functional mobility training, compression bandaging, moist heat, cryotherapy, patient/family education, cognitive remediation/compensation, visual/perceptual remediation/compensation, psychosocial skills training, energy conservation, coping strategies training, and DME and/or AE  instructions  RECOMMENDED OTHER SERVICES: NA  CONSULTED AND AGREED WITH PLAN OF CARE: Patient and family member/caregiver  PLAN FOR NEXT SESSION: Initiate vision exercises (for saccades), educate on AE/DME for LB bathing/dressing, functional transfers for shower transfers, coordination, dynamic standing   Adrieanna Boteler, [place], OTR/L 05/30/2022, 8:31 AM

## 2022-05-30 NOTE — Patient Instructions (Signed)
Functional activities: Christopher Yu toss within Left hand Balls toss R <> L hand (large arc and side to side) Chest pass with use of Both hands  Fold towels, clothes, socks, etc with both hand Put dishes away  [name] clothes in closet

## 2022-06-02 ENCOUNTER — Ambulatory Visit: Payer: BC Managed Care – PPO

## 2022-06-02 ENCOUNTER — Ambulatory Visit: Payer: BC Managed Care – PPO | Admitting: Occupational Therapy

## 2022-06-02 DIAGNOSIS — M6281 Muscle weakness (generalized): Secondary | ICD-10-CM | POA: Diagnosis not present

## 2022-06-02 DIAGNOSIS — R278 Other lack of coordination: Secondary | ICD-10-CM | POA: Diagnosis not present

## 2022-06-02 DIAGNOSIS — M5459 Other low back pain: Secondary | ICD-10-CM | POA: Diagnosis not present

## 2022-06-02 DIAGNOSIS — R2689 Other abnormalities of gait and mobility: Secondary | ICD-10-CM

## 2022-06-02 DIAGNOSIS — R41842 Visuospatial deficit: Secondary | ICD-10-CM

## 2022-06-02 DIAGNOSIS — R42 Dizziness and giddiness: Secondary | ICD-10-CM | POA: Diagnosis not present

## 2022-06-02 DIAGNOSIS — R2681 Unsteadiness on feet: Secondary | ICD-10-CM | POA: Diagnosis not present

## 2022-06-02 DIAGNOSIS — R262 Difficulty in walking, not elsewhere classified: Secondary | ICD-10-CM | POA: Diagnosis not present

## 2022-06-02 NOTE — Patient Instructions (Addendum)
Local Driver Evaluation Programs:  Comprehensive Evaluation: includes clinical and in vehicle behind the wheel testing by OCCUPATIONAL THERAPIST. Programs have varying levels of adaptive controls available for trial.   [employer], [place] [address] [place], [place]  [address] [phone] or [phone] [email or web address] Evaluator:  Richelle Ito, OT/CDRS/CDI/SCDCM/[facility] [address] [place], [place] [address] [phone] [email or web address] Evaluators:  Bertram Savin, OT and Mertie Clause, OT  [facility]) [place] ([place]) [facility] [place] [address] [place], [place]  [address] [phone] [email or web address] Evaluators:  Bernadene Bell, KT; Heron Sabins, KT;  Shirlee Latch, KT (KT=kiniesotherapist)   Clinical evaluations only:  Includes clinical testing, refers to other programs or local certified driving instructor for behind the wheel testing.  [facility] (outpatient Rehab) [place] [address] [place],  [address] [phone] for scheduling [email or web address] Evaluators:  Valentino Hue, OT; Haynes Hoehn, OT  Other area clinical evaluators available upon request including [facility], [place] and [facility].       Resource List What is a Warden/ranger: Your Road Ahead - A Guide to [employer] Evaluations http://www.thehartford.[email or web address]/resources/mature-market-excellence/publications-on-aging  Association for Musician - Disability and Driving Fact Sheets http://www.aded.net/?page=510  Driving after a Brain  Injury: Brain Injury Association of [employer] [place].be?A=SearchResult&SearchID=9495675&ObjectID=2758842&ObjectType=35  Driving with Adaptive Equipment: Chi[employer] Association [email or web address]

## 2022-06-02 NOTE — Therapy (Signed)
OUTPATIENT OCCUPATIONAL THERAPY  Treatment Note  Patient Name: Nikhil Opsahl MRN: [id number] DOB:18-Jul-1960, 62 y.o., male Today's Date: 06/02/2022  PCP: Lavone Orn, MD REFERRING PROVIDER: Consuella Lose, MD  END OF SESSION:  OT End of Session - 06/02/22 1602     Visit Number 8    Number of Visits 17    Date for OT Re-Evaluation 06/20/22    Authorization Type [employer]    OT Start Time 1538    OT Stop Time 1618    OT Time Calculation (min) 40 min    Activity Tolerance Patient tolerated treatment well    Behavior During Therapy [facility] for tasks assessed/performed;Flat affect                  Past Medical History:  Diagnosis Date   Anxiety    Arthritis, rheumatoid ([place])    dx 1988   CAD (coronary artery disease), native coronary artery    Mild LAD disease with calcification noted at cath 12//27/16    Cardiomyopathy [facility])    Chronic systolic heart failure ([place]) 05/01/2015   Depression    Deviated nasal septum 06/25/2011   Headache    Hyperlipidemia    Impingement syndrome of left shoulder 12/22/2017   Rheumatoid aortitis    Sleep apnea    does not use cpap - UNABLE TO TOLERATE MASK   Sleep apnea in adult    deviated septum repaired, most recent sleep study was negative   Status post total right knee replacement 06/01/2015   Past Surgical History:  Procedure Laterality Date   ANKLE FUSION Right 2014   related to his arthritis   ANKLE FUSION Left 05/25/13   related to his arthritis   APPLICATION OF CRANIAL NAVIGATION N/A 04/11/2022   Procedure: APPLICATION OF CRANIAL NAVIGATION;  Surgeon: Consuella Lose, MD;  Location: [place];  Service: Neurosurgery;  Laterality: N/A;   CARDIAC CATHETERIZATION N/A 05/01/2015   Procedure: Left Heart Cath and Coronary Angiography;  Surgeon: Belva Crome, MD;  Location: [place] [facility];  Service: Cardiovascular;  Laterality: N/A;   CRANIOTOMY N/A 04/11/2022   Procedure: STEREOTACTIC SUBOCCIPITAL CRANIOTOMY  FOR RESECTION OF ARTERIO-VENOUS MALFORMATION;  Surgeon: Consuella Lose, MD;  Location: [place];  Service: Neurosurgery;  Laterality: N/A;   FRACTURE SURGERY     left femur fracture x 3   IR ANGIO EXTERNAL CAROTID SEL EXT CAROTID BILAT MOD SED  01/28/2022   IR ANGIO INTRA EXTRACRAN SEL INTERNAL CAROTID BILAT MOD SED  01/28/2022   IR ANGIO INTRA EXTRACRAN SEL INTERNAL CAROTID BILAT MOD SED  04/16/2022   IR ANGIO VERTEBRAL SEL VERTEBRAL BILAT MOD SED  01/28/2022   IR ANGIO VERTEBRAL SEL VERTEBRAL BILAT MOD SED  04/16/2022   KNEE ARTHROSCOPY     right x 4   NASAL SEPTOPLASTY W/ TURBINOPLASTY  06/25/2011   Procedure: NASAL SEPTOPLASTY WITH TURBINATE REDUCTION;  Surgeon: Jerrell Belfast, MD;  Location: [place];  Service: ENT;  Laterality: Bilateral;   TONSILLECTOMY     TOTAL KNEE ARTHROPLASTY Right 06/01/2015   Procedure: RIGHT TOTAL KNEE ARTHROPLASTY;  Surgeon: Mcarthur Rossetti, MD;  Location: [facility];  Service: Orthopedics;  Laterality: Right;  Block+general   Patient Active Problem List   Diagnosis Date Noted   Status post craniotomy 04/11/2022   AVM (arteriovenous malformation) brain 04/11/2022   Impingement syndrome of left shoulder 12/22/2017   Impingement syndrome of right shoulder 12/22/2017   Rheumatoid arthritis involving right knee ([place]) 06/01/2015   Status post total  right knee replacement 06/01/2015   Cardiomyopathy ([facility]) 05/02/2015   Hyperlipidemia    Sleep apnea in adult    Rheumatoid arthritis ([facility])    Chronic systolic heart failure ([facility]) 05/01/2015   CAD (coronary artery disease), native coronary artery     ONSET DATE: 04/11/22  REFERRING DIAG: B15.176 (ICD-10-CM) - Status post craniotomy Q28.2 (ICD-10-CM) - AVM (arteriovenous malformation) brain  THERAPY DIAG:  No diagnosis found.  Rationale for Evaluation and Treatment: Rehabilitation  SUBJECTIVE:   SUBJECTIVE STATEMENT: Pt reports "more tired than anything", that he wakes up 2-3 times per night and has  difficulty falling asleep. Pt accompanied by: self and significant other  PERTINENT HISTORY:  Pt is s/p suboccipital craniotomy for resection of cerebellar AVM. PMH: RA, anxiety, CAD PSH: L TKA  PRECAUTIONS: Other: no bending, twisting, lifting > 10#  WEIGHT BEARING RESTRICTIONS:  no lifting > 10#  PAIN:  Are you having pain? Yes: NPRS scale: 2/10 Pain location: neck Pain description: dull/nagging, aggravating Aggravating factors: neck exercises Relieving factors: pain meds occasionally  FALLS: Has patient fallen in last 6 months? No  LIVING ENVIRONMENT: Lives with: lives with their family (wife, and 19 and 33 yo daughters) Lives in: House/apartment Stairs: Yes: Internal: full flight of steps to home office, however is currently not having to navigate steps; on right going up and External: 3 steps; on right going up Has following equipment at home: Dan Humphreys - 2 wheeled and shower chair  PLOF: Independent, Independent with basic ADLs, Independent with household mobility without device, and Vocation/Vocational requirements: working as a Production designer, theatre/television/film in [employer], requires occasional travel by plane  PATIENT GOALS: to be able to move without having to use a walker  OBJECTIVE:   HAND DOMINANCE: Right  ADLs: Transfers/ambulation related to ADLs: Min A with RW Eating: wife will assist with cutting up foods, opening items, and occasionally placing items in his hand Grooming: Supervision when standing at sink UB Dressing: Min assist LB Dressing: Mod-max assist Toileting: wife will assist with pulling up pants/hygiene, min assist for transfers off toilet Bathing: supervision/cues for bathing, wife completes 5% Tub Shower transfers: Mod-max assist transfers in/out of shower Equipment: Shower seat without back, Walk in shower, Long handled shoe horn, and Long handled sponge   MOBILITY STATUS: Needs Assist: Min A with RW  ACTIVITY TOLERANCE: Activity tolerance:  diminished  FUNCTIONAL OUTCOME MEASURES: Myrtis Ser Index of Independence in ADLs:2/6  COORDINATION: Finger Nose Finger test: overshooting mildly with LUE 9 Hole Peg test: Right: 36.38 sec; Left: 1:00.87 sec Box and Blocks:  Right 41 blocks, Left 35 blocks  COGNITION: Overall cognitive status: Difficulty to assess due to: flat affect and decreased engagement in tasks s/p surgery Pt reports that he has been avoiding his phone, television, and bill paying, anything that makes him think. Spouse reports he is on a lot of medications which may also be impacting his arousal/cognition.    VISION ASSESSMENT: To be further assessed in functional context Ocular ROM: WFL Tracking/Visual pursuits: Able to track stimulus in all quads without difficulty Saccades: additional eye shifts occurred during testing, decreased speed of saccadic movements, and additional eye shifts in B eyes when looking to L  Patient has difficulty with following activities due to following visual impairments: is currently not reading, looking at phone or tv  OBSERVATIONS: flat affect, closing eyes frequently   TODAY'S TREATMENT:  06/02/22 Return to driving: discussed typical return to driving recommendations.  Pt reports surgeon deferring decision to return to driving to PT/OT.  Discussed return to driving recommendations with driving in parking lot > neighborhood > back roads, etc.  OT also educated on in clinic and comprehensive drivers evaluations if needed.  Provided with printouts of above. Ball toss with BUE while completing cognitive dual tasking to name items based on letter on ball.  Pt demonstrating mild improved coordination with ball toss with BUE compared to previous session. [place]: completed small peg board pattern replication with LUE with focus on motor control and coordination.  Pt dropping 20% of pegs due to small size and decreased coordination.   Transitioned to tangram activity with challenge to pick up pieces to fit into picture.  Pt frequently dragging pieces to edge of table, OT encouraging pt to attempt to pick up.  Completed second tangram picture with pt successfully picking up pieces without dragging.  OT encouraged pt to engage in jigsaw puzzles with use of nondominant LUE 25-50% of time.   05/30/22 Shower transfers: Pt reports that he is getting in the shower with Min- steady assist when stepping over shower ledge and then supervision for bathing.  Pt reports that he starts out standing, but then has shower chair to sit as he fatigues.  Pt states that he stands to wash his hair standing, but then sits when applying soap to wash cloth and then remains seated.   Cup stacking: engaged in stacking cups in pyramid shape with LUE with focus on gross motor control.  Pt completing initial pyramid stack with good effort and only dropping one.  OT then increased challenge to rotating cups in finger tips prior to stacking. Pt then dropping x3 due to increased gross and fine motor control challenge.  Incorporated reaching across midline and out to L side to challenge weight shifting and functional reach.   Ball toss: toss/catch with ipsilateral LUE as well as tossing R <> L UE.  OT educating on motor control as well as visual component to task.  Pt demonstrating improved motor control and decreased drops with use of "juggling" ball due to increased weight for additional proprioception during toss/catch. BUE use: engaged in chest pass with soccer ball with focus on symmetry with tossing.  OT increasing challenge by moving to facilitate grading of movement and motor control when tossing/catching.  Engaged in folding towels with focus on use of BUE in tandem and with reciprocal movements.      05/28/22 5x sit > stand: 18.19 sec.  Pt demonstrating improved motor control and controlled descent.  Pt reports dizziness afterwards, reporting he typically will  stand up and wait a moment to allow everything to settle prior to moving. OT reviewing best practice to decrease fall risk and to take a pause once upright in standing to acclimate prior to ambulating.   Pipe tree puzzle: Pt tolerated standing 5.5 mins with alternating UE support on table while completing vertical pipe tree puzzle.  OT increased challenge to retrieving items with LUE to focus on motor control with reach and manipulation of PVC pieces.  Pt reports fatigue and lightheadedness post standing. 9 hole peg: Completed in 1:00 with 3 drops and increased trunk and UE ataxia.  Completed 2nd attempt in 45.38 sec with improvements in motor control and no drops.   ADL: Pt and spouse report that pt is moving around home with improved ease, however still with tendency to move too quickly  sometimes and demonstrating instability.  Pt reports "shaky" when shaving both in UE as well as trunk.  Utilizes seat intermittently to allow for rest break during grooming tasks.  Pt reports setup for clothing, with pt able to dress himself with occasional assist for socks/shoes.  Min A/ steady A for shower transfers, and would not attempt bathing if wife not present. Trunk control: engaged in supine bridging with and without ball between knees to further challenge core stability.  OT noted instability in trunk during standing tasks, possibly attributing to coordination impairments.   Educated on addressing core stability to aid in standing balance as well as LUE coordination.    PATIENT EDUCATION: Education details: ongoing condition specific education Person educated: Patient and Spouse Education method: Explanation Education comprehension: verbalized understanding and needs further education  HOME EXERCISE PROGRAM: Coordination HEP  Functional tasks for LUE/BUE (see pt instructions)   GOALS: Goals reviewed with patient? Yes  SHORT TERM GOALS: Target date: 05/23/22  Pt will be independent in HEP for fine  and gross motor control. Baseline: Goal status: IN PROGRESS  2.  Pt will verbalize understanding of adaptive strategies, task modifications, and/or potential A/E needs to increase ease, safety, and independence w/ ADLs. Baseline:  Goal status: MET - 05/28/22  3.  Pt will demonstrate improved ease with sit > stand to get up from toilet or other seating options in home by improving score on sit > stand to decreased fall risk.  Baseline: time TBD Goal status: MET - 18.19 sec on 05/28/22 and pt and spouse report improved mobility in home  4.  Pt will perform dynamic standing task for 5 mins as needed for simple IADLs w/o LOB using DME and/or countertop support prn   Baseline:  Goal status: MET - 05/28/22  5.  Pt will demonstrate improved fine motor coordination for ADLs as evidenced by decreasing 9 hole peg test score for LUE by 3 secs  Baseline:9 Hole Peg test: Right: 36.38 sec; Left: 1:00.87 sec  Goal status: MET - 52.7 sec on 05/28/22   LONG TERM GOALS: Target date: 06/20/22  Pt will demonstrate shower transfers with DME PRN at supervision level. Baseline: currently sponge bathing due to fearfulness of showering, but wants to return to showers.   Goal status: IN PROGRESS - Min - Steady A for transfers on 05/30/22   Pt will report increased participation in IADLs (such as cooking) to increase independence with functional IADLs.  Baseline:  Goal status: IN PROGRESS   Pt will complete UB dressing, to include clothing fasteners, at Mod I level with use of AE and/or alternative strategies PRN. Baseline:  Goal status: IN PROGRESS   Pt will complete LB dressing with Supervision/setup with use of AE and/or alternative strategies PRN. Baseline:  Goal status: IN PROGRESS   Pt will demonstrate improved fine motor coordination for ADLs as evidenced by decreasing 9 hole peg test score for LUE by 10 secs to increase participation in medication management. Baseline: Goal status: IN  PROGRESS  ASSESSMENT:  CLINICAL IMPRESSION: Pt asking questions about return to driving.  Discussed attempting to drive in empty parking lot initially with spouse in car, once pt, spouse, and MD report pt ready to resume driving trials.  Discussed additional clinic and comprehensive drivers evaluations if desired.  Pt continues to demonstrate min-mod coordination impairments with LUE, with gross and fine motor control, however demonstrating good problem solving and adaptive strategies.  PERFORMANCE DEFICITS: in functional skills including ADLs, IADLs, coordination, pain, Fine  motor control, Gross motor control, balance, body mechanics, endurance, decreased knowledge of precautions, decreased knowledge of use of DME, vision, and UE functional use, cognitive skills including energy/drive, problem solving, and thought, and psychosocial skills including environmental adaptation, habits, and routines and behaviors.   IMPAIRMENTS: are limiting patient from ADLs and IADLs.   CO-MORBIDITIES: may have co-morbidities  that affects occupational performance. Patient will benefit from skilled OT to address above impairments and improve overall function.  MODIFICATION OR ASSISTANCE TO COMPLETE EVALUATION: Min-Moderate modification of tasks or assist with assess necessary to complete an evaluation.  OT OCCUPATIONAL PROFILE AND HISTORY: Detailed assessment: Review of records and additional review of physical, cognitive, psychosocial history related to current functional performance.  CLINICAL DECISION MAKING: Moderate - several treatment options, min-mod task modification necessary  REHAB POTENTIAL: Good  EVALUATION COMPLEXITY: Moderate    PLAN:  OT FREQUENCY: 2x/week  OT DURATION: 8 weeks  PLANNED INTERVENTIONS: self care/ADL training, therapeutic exercise, therapeutic activity, neuromuscular re-education, balance training, functional mobility training, compression bandaging, moist heat, cryotherapy,  patient/family education, cognitive remediation/compensation, visual/perceptual remediation/compensation, psychosocial skills training, energy conservation, coping strategies training, and DME and/or AE instructions  RECOMMENDED OTHER SERVICES: NA  CONSULTED AND AGREED WITH PLAN OF CARE: Patient and family member/caregiver  PLAN FOR NEXT SESSION: Initiate vision exercises (for saccades), educate on AE/DME for LB bathing/dressing, functional transfers for shower transfers, coordination, dynamic standing   Galileah Piggee, [place], OTR/L 06/02/2022, 4:02 PM

## 2022-06-03 ENCOUNTER — Ambulatory Visit: Payer: BC Managed Care – PPO

## 2022-06-03 DIAGNOSIS — R262 Difficulty in walking, not elsewhere classified: Secondary | ICD-10-CM

## 2022-06-03 DIAGNOSIS — M5459 Other low back pain: Secondary | ICD-10-CM | POA: Diagnosis not present

## 2022-06-03 DIAGNOSIS — R42 Dizziness and giddiness: Secondary | ICD-10-CM

## 2022-06-03 DIAGNOSIS — M6281 Muscle weakness (generalized): Secondary | ICD-10-CM | POA: Diagnosis not present

## 2022-06-03 DIAGNOSIS — R2689 Other abnormalities of gait and mobility: Secondary | ICD-10-CM

## 2022-06-03 DIAGNOSIS — R41842 Visuospatial deficit: Secondary | ICD-10-CM | POA: Diagnosis not present

## 2022-06-03 DIAGNOSIS — R2681 Unsteadiness on feet: Secondary | ICD-10-CM

## 2022-06-03 DIAGNOSIS — R278 Other lack of coordination: Secondary | ICD-10-CM | POA: Diagnosis not present

## 2022-06-03 NOTE — Therapy (Signed)
OUTPATIENT PHYSICAL THERAPY NEURO TREATMENT   Patient Name: Christopher Yu MRN: [id number] DOB:12-23-1960, 62 y.o., male Today's Date: 06/03/2022   PCP: Kirby Funk, MD  REFERRING PROVIDER: Lisbeth Renshaw, MD  END OF SESSION:  PT End of Session - 06/03/22 1617     Visit Number 11    Number of Visits 17    Date for PT Re-Evaluation 06/17/22    Authorization Type [employer]    Authorization - Visit Number 11    Authorization - Number of Visits 15   30 PT and OT combined   PT Start Time 1615    PT Stop Time 1700    PT Time Calculation (min) 45 min    Equipment Utilized During Treatment Gait belt    Activity Tolerance Patient tolerated treatment well    Behavior During Therapy WFL for tasks assessed/performed              Past Medical History:  Diagnosis Date   Anxiety    Arthritis, rheumatoid (HCC)    dx 1988   CAD (coronary artery disease), native coronary artery    Mild LAD disease with calcification noted at cath 12//27/16    Cardiomyopathy [facility])    Chronic systolic heart failure (HCC) 05/01/2015   Depression    Deviated nasal septum 06/25/2011   Headache    Hyperlipidemia    Impingement syndrome of left shoulder 12/22/2017   Rheumatoid aortitis    Sleep apnea    does not use cpap - UNABLE TO TOLERATE MASK   Sleep apnea in adult    deviated septum repaired, most recent sleep study was negative   Status post total right knee replacement 06/01/2015   Past Surgical History:  Procedure Laterality Date   ANKLE FUSION Right 2014   related to his arthritis   ANKLE FUSION Left 05/25/13   related to his arthritis   APPLICATION OF CRANIAL NAVIGATION N/A 04/11/2022   Procedure: APPLICATION OF CRANIAL NAVIGATION;  Surgeon: Lisbeth Renshaw, MD;  Location: [facility] OR;  Service: Neurosurgery;  Laterality: N/A;   CARDIAC CATHETERIZATION N/A 05/01/2015   Procedure: Left Heart Cath and Coronary Angiography;  Surgeon: Lyn Records, MD;  Location: [facility] INVASIVE CV  LAB;  Service: Cardiovascular;  Laterality: N/A;   CRANIOTOMY N/A 04/11/2022   Procedure: STEREOTACTIC SUBOCCIPITAL CRANIOTOMY FOR RESECTION OF ARTERIO-VENOUS MALFORMATION;  Surgeon: Lisbeth Renshaw, MD;  Location: [facility] OR;  Service: Neurosurgery;  Laterality: N/A;   FRACTURE SURGERY     left femur fracture x 3   IR ANGIO EXTERNAL CAROTID SEL EXT CAROTID BILAT MOD SED  01/28/2022   IR ANGIO INTRA EXTRACRAN SEL INTERNAL CAROTID BILAT MOD SED  01/28/2022   IR ANGIO INTRA EXTRACRAN SEL INTERNAL CAROTID BILAT MOD SED  04/16/2022   IR ANGIO VERTEBRAL SEL VERTEBRAL BILAT MOD SED  01/28/2022   IR ANGIO VERTEBRAL SEL VERTEBRAL BILAT MOD SED  04/16/2022   KNEE ARTHROSCOPY     right x 4   NASAL SEPTOPLASTY W/ TURBINOPLASTY  06/25/2011   Procedure: NASAL SEPTOPLASTY WITH TURBINATE REDUCTION;  Surgeon: Osborn Coho, MD;  Location: [facility] OR;  Service: ENT;  Laterality: Bilateral;   TONSILLECTOMY     TOTAL KNEE ARTHROPLASTY Right 06/01/2015   Procedure: RIGHT TOTAL KNEE ARTHROPLASTY;  Surgeon: Kathryne Hitch, MD;  Location: [facility] ORS;  Service: Orthopedics;  Laterality: Right;  Block+general   Patient Active Problem List   Diagnosis Date Noted   Status post craniotomy 04/11/2022   AVM (arteriovenous malformation) brain 04/11/2022  Impingement syndrome of left shoulder 12/22/2017   Impingement syndrome of right shoulder 12/22/2017   Rheumatoid arthritis involving right knee ([place]) 06/01/2015   Status post total right knee replacement 06/01/2015   Cardiomyopathy ([place]) 05/02/2015   Hyperlipidemia    Sleep apnea in adult    Rheumatoid arthritis ([place])    Chronic systolic heart failure ([place]) 05/01/2015   CAD (coronary artery disease), native coronary artery     ONSET DATE: 04/11/22  REFERRING DIAG: U27.253 (ICD-10-CM) - Status post craniotomy Q28.2 (ICD-10-CM) - AVM (arteriovenous malformation) brain  THERAPY DIAG:  Other abnormalities of gait and mobility  Muscle weakness  (generalized)  Unsteadiness on feet  Dizziness and giddiness  Difficulty in walking, not elsewhere classified  Rationale for Evaluation and Treatment: Rehabilitation  SUBJECTIVE:                                                                                                                                                                                             SUBJECTIVE STATEMENT: Been busier today walking more around the house. Efforts on the bike are limited to around 2 min rounds. Fatigue is prominent. HA has been less frequent. Pt accompanied by: significant other  PERTINENT HISTORY: Anxiety, RA, CAD, cardiomyopathy, chronic systolic failure, depression, HA, HLD, R TKA 2017, B ankle fusion 2014/2015, L femur fx surgery x3, craniotomy with resection of AVM 04/11/22  PAIN:  Are you having pain? Yes: NPRS scale: 4/10 Pain location: Headache Pain description: headache Aggravating factors: movement, light, noise Relieving factors: trying to rest, dark, quiet room  PRECAUTIONS: Other: per hospital DC note: No lifting >10 lbs, No bending/twisting at waist   WEIGHT BEARING RESTRICTIONS: No  FALLS: Has patient fallen in last 6 months? No  LIVING ENVIRONMENT: Lives with: lives with their spouse Lives in: House/apartment Stairs:  2 story house with 3 steps to enter with handrail on R Has following equipment at home: Environmental consultant - 2 wheeled and Shower bench  PLOF: Independent; [place] in home office which is on 2nd floor; golf  PATIENT GOALS: improve stability   OBJECTIVE:   TODAY'S TREATMENT: 06/03/22 Activity Comments  habituation -cervical flexion-extension--discomfort on posterior neck from pulling on surgical site -cervical rotation 3x  Oculomotor (seated) -horizontal/vertical saccades: 2-target, no symptoms  Forward march/backward walk 4x25 ft CGA  Sidestepping  4x25 ft CGA  Fig 8 walk 4 laps  Side step over 6" hurdle Some dizziness/unsteadiness with looking down   LAQ  3x10 5#  Standing hamstring curl 1x10 5#  Seated hip add iso 2x10 3 sec hold  Seated hip abd 2x10 Green band     [facility] Adult  PT Treatment:                                                DATE: 05/29/2022 Therapeutic Exercise: Nu-step level 4 x 3 minutes Les only  Sit<>stand x 5 reps without UE support with increasing HA and dizziness Supine Trunk rotation adding head motion x 8 reps without dizziness Bridges x 10 reps Bridges with marching x 5 reps R and L Dizziness 5/10 and HA 6/10 after supine exercises and pressure in his head is increasing (to top and front); sat and let symptoms settle Neuromuscular re-ed: Habituation cervical flexion to neutral x 4 reps cervical extension to neutral x 4 reps Combined flexion and extension x 5 reps Dizziness rated 5/10 after vertical head movements Standing head motion Oculomotor=moving between 2 targets in horizontal plane, tracking horizontal/vertical for smooth pursuits (did while resting-- no increase in symptoms-- continues with overshooting on saccadic eye movements) Foam standing with eyes open, eyes closed, rocker board with board positioned with L lateral lean working with eyes open and eyes closed with CGA to min A Marching in place Eyes open/eyes closed with narrowing base of support and CGA to SBA After standing has 5/10 HA Gait: Figure 8 walking with CGA x 4 reps without increased dizziness Gait with RW into clinic x 50 feet with cues for slower pace Gait without device in clinic with CGA   PATIENT EDUCATION: Education details: handout provided for safe stair sequencing, encouraged continued SPC use indoor and RW outdoors for max safety an energy conservation; discussion on finding right amount of activity to avoid extreme exacerbation of symptoms  Person educated: Patient Education method: Explanation, Demonstration, Tactile cues, Verbal cues, and Handouts Education comprehension: verbalized understanding and returned  demonstration  HOME EXERCISE PROGRAM Last updated: 05/22/22 Access Code: [email or web address] URL: [email or web address] Date: 05/22/2022 Prepared by: Rudell Cobb  Program Notes REMEMBER: Get up and stand in place, take a deep breath, and remember to go slow. Recumbent bike x 2 minutes every day to begin a consistent routine.  Exercises - Seated Cervical Flexion AROM  - 1 x daily - 7 x weekly - 1 sets - 3 reps - Seated Cervical Rotation AROM  - 1 x daily - 7 x weekly - 1 sets - 3 reps   Below measures were taken at time of initial evaluation unless otherwise specified:  DIAGNOSTIC FINDINGS: CEREBRAL ANGIOGRAM 04/16/22: Complete resection of previously identified left cerebellar arteriovenous malformation, without residual nidus or early venous drainage seen.  COGNITION: Overall cognitive status:  pt notes "trying very little to think"   SENSATION: WFL  COORDINATION: Alternating pronation/supination: dysmetria on L Alternating toe tap: intact B Finger to nose: L mild-mod dysmetria and bradykinesia   MUSCLE TONE: intact B   POSTURE: No Significant postural limitations  OBSERVATION: suboccipital incision well-healing and without red flag signs  PALPATION: no TTP in B shoulders  LOWER EXTREMITY ROM:     Active  Right Eval Left Eval  Ankle dorsiflexion 4 -3  Ankle plantarflexion     (Blank rows = not tested)  LOWER EXTREMITY MMT:    MMT (in sitting) Right Eval Left Eval  Hip flexion 4+ 4  Hip extension    Hip abduction 4+ 4+  Hip adduction 4+ 4+  Hip internal rotation    Hip external rotation    Knee flexion 4 4  Knee extension 4 4+  Ankle dorsiflexion 4+ 4+  Ankle plantarflexion 4+ 4+  (Blank rows = not tested)  VESTIBULAR ASSESSMENT   GENERAL OBSERVATION: pt wears bifocals most of the time     OCULOMOTOR EXAM:   Ocular Alignment:  L eye slightly elevated in orbit   Ocular ROM: No Limitations   Spontaneous Nystagmus: absent   Gaze-Induced  Nystagmus: absent   Smooth Pursuits:  2-3 saccades in each direction   Saccades:  poor trajectory L, overshooting R, slow vertical   Convergence/Divergence: 1.5 ft before diplopia    VESTIBULAR - OCULAR REFLEX:    Slow VOR: Normal performed within tolerable cervical ROM   VOR Cancellation: Corrective Saccades B   Head-Impulse Test: NT    M-CTSIB  Condition 1: Firm Surface, EO 10 Sec, Severe and inability to self-correct, required mod A and walker support to steady self    Condition 2: Firm Surface, EC NT Sec,  NT  Sway  Condition 3: Foam Surface, EO NT Sec,  NT  Sway  Condition 4: Foam Surface, EC NT Sec,  NT  Sway   GAIT: Gait pattern:  B short steps with poor safety awareness Assistive device utilized: Walker - 2 wheeled Level of assistance: CGA  GOALS: Goals reviewed with patient? Yes  SHORT TERM GOALS: Target date: 05/13/2022  Patient to be independent with initial HEP. Baseline: HEP initiated Goal status: Not Met-- patient is having a hard time completing HEP due to HA, nausea, fatigue. *Modified 05/22/22  LONG TERM GOALS: Target date: 06/17/2022  Patient to be independent with advanced HEP. Baseline: Not yet initiated  Goal status: IN PROGRESS  Patient to demonstrate B LE strength >/=4+/5.  Baseline: See above Goal status: IN PROGRESS  Patient to report 75% improvement in HAs.  Baseline: - Goal status: IN PROGRESS  Patient to no dizziness with transfers or bed mobility.  Baseline: dizziness Goal status: IN PROGRESS  Patient to complete TUG in <14 sec with LRAD in order to decrease risk of falls.   Baseline: 31.36 sec with RW Goal status: IN PROGRESS  Patient to demonstrate 5xSTS test in <15 sec in order to decrease risk of falls.  Baseline: 31.18 sec with B armrests Goal status: IN PROGRESS  Patient to score at least 40/56 on Berg in order to decrease risk of falls.  Baseline: 23/56 Goal status: IN PROGRESS  ASSESSMENT:  CLINICAL IMPRESSION: Reports  no HA or nausea experienced with oculomotor activities but notable for continued overshooting during saccades.  Proceeded with dynamic balance activities to improve postural stability and several instances of LOB with single limb stance demands and when stepping over obstacles looking down causing unsteadiness and symptoms of dizziness. Initiated resistance training which was tolerated well in sitting, less so in standing (3x10 reps vs 1x10). Continued sessions to advance POC details.   OBJECTIVE IMPAIRMENTS: Abnormal gait, decreased balance, decreased coordination, decreased knowledge of use of DME, difficulty walking, decreased ROM, decreased strength, dizziness, and pain.   PLAN:  PT FREQUENCY: 2x/week  PT DURATION: 8 weeks  PLANNED INTERVENTIONS: Therapeutic exercises, Therapeutic activity, Neuromuscular re-education, Balance training, Gait training, Patient/Family education, Self Care, Joint mobilization, Stair training, Vestibular training, Canalith repositioning, DME instructions, Aquatic Therapy, Dry Needling, Electrical stimulation, Cryotherapy, Moist heat, Taping, Manual therapy, and Re-evaluation  PLAN FOR NEXT SESSION: plan to progress from seated head motions to gaze adaptation again when he can tolerate more activity (currently not doing regular HEP b/c of HA with activities), standing activities as tolerated;  gait training with cane  5:08 PM, 06/03/22 M. Sherlyn Lees, PT, DPT Physical Therapist- [place] Office Number: [phone]   [place] [facility] [address], [address], [place] [address] Phone # [phone] Fax # [phone]

## 2022-06-04 ENCOUNTER — Ambulatory Visit: Payer: BC Managed Care – PPO

## 2022-06-04 ENCOUNTER — Ambulatory Visit: Payer: BC Managed Care – PPO | Admitting: Occupational Therapy

## 2022-06-04 DIAGNOSIS — R278 Other lack of coordination: Secondary | ICD-10-CM | POA: Diagnosis not present

## 2022-06-04 DIAGNOSIS — M5459 Other low back pain: Secondary | ICD-10-CM | POA: Diagnosis not present

## 2022-06-04 DIAGNOSIS — R262 Difficulty in walking, not elsewhere classified: Secondary | ICD-10-CM | POA: Diagnosis not present

## 2022-06-04 DIAGNOSIS — M6281 Muscle weakness (generalized): Secondary | ICD-10-CM

## 2022-06-04 DIAGNOSIS — R41842 Visuospatial deficit: Secondary | ICD-10-CM | POA: Diagnosis not present

## 2022-06-04 DIAGNOSIS — R42 Dizziness and giddiness: Secondary | ICD-10-CM | POA: Diagnosis not present

## 2022-06-04 DIAGNOSIS — R2681 Unsteadiness on feet: Secondary | ICD-10-CM | POA: Diagnosis not present

## 2022-06-04 DIAGNOSIS — R2689 Other abnormalities of gait and mobility: Secondary | ICD-10-CM

## 2022-06-04 NOTE — Therapy (Addendum)
OUTPATIENT PHYSICAL THERAPY NEURO TREATMENT   Patient Name: Christopher Yu MRN: [id number] DOB:[birth year], 62 y.o., male Today's Date: 06/05/2022  Progress Note Reporting Period 04/22/22 to 06/05/22  See note below for Objective Data and Assessment of Progress/Goals.   PCP: Lavone Orn, MD  REFERRING PROVIDER: Consuella Lose, MD  END OF SESSION:  PT End of Session - 06/05/22 1703     Visit Number 12    Number of Visits 17    Date for PT Re-Evaluation 06/17/22    Authorization Type [employer]    Authorization - Visit Number 12    Authorization - Number of Visits 70   30 PT and OT combined   PT Start Time 1619    PT Stop Time 1700    PT Time Calculation (min) 41 min    Equipment Utilized During Treatment Gait belt    Activity Tolerance Patient tolerated treatment well    Behavior During Therapy WFL for tasks assessed/performed               Past Medical History:  Diagnosis Date   Anxiety    Arthritis, rheumatoid ([place])    dx 1988   CAD (coronary artery disease), native coronary artery    Mild LAD disease with calcification noted at cath 12//27/16    Cardiomyopathy [facility])    Chronic systolic heart failure ([place]) 05/01/2015   Depression    Deviated nasal septum 06/25/2011   Headache    Hyperlipidemia    Impingement syndrome of left shoulder 12/22/2017   Rheumatoid aortitis    Sleep apnea    does not use cpap - UNABLE TO TOLERATE MASK   Sleep apnea in adult    deviated septum repaired, most recent sleep study was negative   Status post total right knee replacement 06/01/2015   Past Surgical History:  Procedure Laterality Date   ANKLE FUSION Right 2014   related to his arthritis   ANKLE FUSION Left 05/25/13   related to his arthritis   APPLICATION OF CRANIAL NAVIGATION N/A 04/11/2022   Procedure: APPLICATION OF CRANIAL NAVIGATION;  Surgeon: Consuella Lose, MD;  Location: [place];  Service: Neurosurgery;  Laterality: N/A;   CARDIAC CATHETERIZATION  N/A 05/01/2015   Procedure: Left Heart Cath and Coronary Angiography;  Surgeon: Belva Crome, MD;  Location: [place] [facility];  Service: Cardiovascular;  Laterality: N/A;   CRANIOTOMY N/A 04/11/2022   Procedure: STEREOTACTIC SUBOCCIPITAL CRANIOTOMY FOR RESECTION OF ARTERIO-VENOUS MALFORMATION;  Surgeon: Consuella Lose, MD;  Location: [place];  Service: Neurosurgery;  Laterality: N/A;   FRACTURE SURGERY     left femur fracture x 3   IR ANGIO EXTERNAL CAROTID SEL EXT CAROTID BILAT MOD SED  01/28/2022   IR ANGIO INTRA EXTRACRAN SEL INTERNAL CAROTID BILAT MOD SED  01/28/2022   IR ANGIO INTRA EXTRACRAN SEL INTERNAL CAROTID BILAT MOD SED  04/16/2022   IR ANGIO VERTEBRAL SEL VERTEBRAL BILAT MOD SED  01/28/2022   IR ANGIO VERTEBRAL SEL VERTEBRAL BILAT MOD SED  04/16/2022   KNEE ARTHROSCOPY     right x 4   NASAL SEPTOPLASTY W/ TURBINOPLASTY  06/25/2011   Procedure: NASAL SEPTOPLASTY WITH TURBINATE REDUCTION;  Surgeon: Jerrell Belfast, MD;  Location: [place];  Service: ENT;  Laterality: Bilateral;   TONSILLECTOMY     TOTAL KNEE ARTHROPLASTY Right 06/01/2015   Procedure: RIGHT TOTAL KNEE ARTHROPLASTY;  Surgeon: Mcarthur Rossetti, MD;  Location: [facility];  Service: Orthopedics;  Laterality: Right;  Block+general   Patient Active Problem  List   Diagnosis Date Noted   Status post craniotomy 04/11/2022   AVM (arteriovenous malformation) brain 04/11/2022   Impingement syndrome of left shoulder 12/22/2017   Impingement syndrome of right shoulder 12/22/2017   Rheumatoid arthritis involving right knee ([place]) 06/01/2015   Status post total right knee replacement 06/01/2015   Cardiomyopathy ([place]) 05/02/2015   Hyperlipidemia    Sleep apnea in adult    Rheumatoid arthritis ([place])    Chronic systolic heart failure ([place]) 05/01/2015   CAD (coronary artery disease), native coronary artery     ONSET DATE: 04/11/22  REFERRING DIAG: [id number] (ICD-10-CM) - Status post craniotomy Q28.2 (ICD-10-CM) - AVM  (arteriovenous malformation) brain  THERAPY DIAG:  Muscle weakness (generalized)  Unsteadiness on feet  Other abnormalities of gait and mobility  Dizziness and giddiness  Rationale for Evaluation and Treatment: Rehabilitation  SUBJECTIVE:                                                                                                                                                                                             SUBJECTIVE STATEMENT: Nothing new since last time. HAs are less severe and less frequent. Describes a prior injury to the R lateral knee which was treated by Dr. Ninfa Linden who said it was a strain. Felt this again today when moving blankets in bed.   Pt accompanied by: significant other  PERTINENT HISTORY: Anxiety, RA, CAD, cardiomyopathy, chronic systolic failure, depression, HA, HLD, R TKA 2017, B ankle fusion 2014/2015, L femur fx surgery x3, craniotomy with resection of AVM 04/11/22  PAIN:  Are you having pain? Yes: NPRS scale: 0/10 Pain location: Headache Pain description: headache Aggravating factors: movement, light, noise Relieving factors: trying to rest, dark, quiet room  PRECAUTIONS: Other: per hospital DC note: No lifting >10 lbs, No bending/twisting at waist   WEIGHT BEARING RESTRICTIONS: No  FALLS: Has patient fallen in last 6 months? No  LIVING ENVIRONMENT: Lives with: lives with their spouse Lives in: House/apartment Stairs:  2 story house with 3 steps to enter with handrail on R Has following equipment at home: Environmental consultant - 2 wheeled and Shower bench  PLOF: Independent; [place] in home office which is on 2nd floor; golf  PATIENT GOALS: improve stability   OBJECTIVE:     TODAY'S TREATMENT: 06/05/22 Activity Comments  R knee valgus/varus testing  Some discomfort with varus testing; TTP over R distal HS tendon  STS + medball 2x5 Cues to scoot forward; mild posterior sway; c/o moderate R knee pain   fwd/back stepping 10x each Weaning UE support  to finger tips; narrow BOS  1/4 turns, 1/2 turns  to targets Good use of wide BOS; good stability; mild dizziness   placing cones from low mat to overhead mirror 2x7 each side Mild-moderate dizziness, worse in L diagonal; good stability; L hand dysmetria   Gait training with pt's cane x168f Cane in R hand moving slower than L foot  Stair navigation with cane x5 Edu patient and wife on safe sequencing  Figure 8 turns with cane x4 Some difficulty sequencing with R turns     PATIENT EDUCATION: Education details: encouraged continued use of RW outside, cane inside; advised patient ok to walk outside with cane on level surfaces with supervision only Person educated: Patient and Spouse Education method: Explanation, Demonstration, Tactile cues, and Verbal cues Education comprehension: verbalized understanding and returned demonstration   HOME EXERCISE PROGRAM Last updated: 05/22/22 Access Code: [email or web address]: [email or web address] Date: 05/22/2022 Prepared by: CRudell Cobb Program Notes REMEMBER: Get up and stand in place, take a deep breath, and remember to go slow. Recumbent bike x 2 minutes every day to begin a consistent routine.  Exercises - Seated Cervical Flexion AROM  - 1 x daily - 7 x weekly - 1 sets - 3 reps - Seated Cervical Rotation AROM  - 1 x daily - 7 x weekly - 1 sets - 3 reps   Below measures were taken at time of initial evaluation unless otherwise specified:  DIAGNOSTIC FINDINGS: CEREBRAL ANGIOGRAM 04/16/22: Complete resection of previously identified left cerebellar arteriovenous malformation, without residual nidus or early venous drainage seen.  COGNITION: Overall cognitive status:  pt notes "trying very little to think"   SENSATION: WFL  COORDINATION: Alternating pronation/supination: dysmetria on L Alternating toe tap: intact B Finger to nose: L mild-mod dysmetria and bradykinesia   MUSCLE TONE: intact B   POSTURE: No Significant  postural limitations  OBSERVATION: suboccipital incision well-healing and without red flag signs  PALPATION: no TTP in B shoulders  LOWER EXTREMITY ROM:     Active  Right Eval Left Eval  Ankle dorsiflexion 4 -3  Ankle plantarflexion     (Blank rows = not tested)  LOWER EXTREMITY MMT:    MMT (in sitting) Right Eval Left Eval  Hip flexion 4+ 4  Hip extension    Hip abduction 4+ 4+  Hip adduction 4+ 4+  Hip internal rotation    Hip external rotation    Knee flexion 4 4  Knee extension 4 4+  Ankle dorsiflexion 4+ 4+  Ankle plantarflexion 4+ 4+  (Blank rows = not tested)  VESTIBULAR ASSESSMENT   GENERAL OBSERVATION: pt wears bifocals most of the time     OCULOMOTOR EXAM:   Ocular Alignment:  L eye slightly elevated in orbit   Ocular ROM: No Limitations   Spontaneous Nystagmus: absent   Gaze-Induced Nystagmus: absent   Smooth Pursuits:  2-3 saccades in each direction   Saccades:  poor trajectory L, overshooting R, slow vertical   Convergence/Divergence: 1.5 ft before diplopia    VESTIBULAR - OCULAR REFLEX:    Slow VOR: Normal performed within tolerable cervical ROM   VOR Cancellation: Corrective Saccades B   Head-Impulse Test: NT    M-CTSIB  Condition 1: Firm Surface, EO 10 Sec, Severe and inability to self-correct, required mod A and walker support to steady self    Condition 2: Firm Surface, EC NT Sec,  NT  Sway  Condition 3: Foam Surface, EO NT Sec,  NT  Sway  Condition 4: Foam Surface, EC NT Sec,  NT  Sway  GAIT: Gait pattern:  B short steps with poor safety awareness Assistive device utilized: Walker - 2 wheeled Level of assistance: CGA  GOALS: Goals reviewed with patient? Yes  SHORT TERM GOALS: Target date: 05/13/2022  Patient to be independent with initial HEP. Baseline: HEP initiated Goal status: Not Met-- patient is having a hard time completing HEP due to HA, nausea, fatigue. *Modified 05/22/22  LONG TERM GOALS: Target date:  06/17/2022  Patient to be independent with advanced HEP. Baseline: Not yet initiated  Goal status: IN PROGRESS  Patient to demonstrate B LE strength >/=4+/5.  Baseline: See above Goal status: IN PROGRESS  Patient to report 75% improvement in HAs.  Baseline: - Goal status: IN PROGRESS  Patient to no dizziness with transfers or bed mobility.  Baseline: dizziness Goal status: IN PROGRESS  Patient to complete TUG in <14 sec with LRAD in order to decrease risk of falls.   Baseline: 31.36 sec with RW Goal status: IN PROGRESS  Patient to demonstrate 5xSTS test in <15 sec in order to decrease risk of falls.  Baseline: 31.18 sec with B armrests Goal status: IN PROGRESS  Patient to score at least 40/56 on Berg in order to decrease risk of falls.  Baseline: 23/56 Goal status: IN PROGRESS  ASSESSMENT:  CLINICAL IMPRESSION: Patient arrived to session with report of a flare of R posterolateral knee pain which he reports is an old injury. Patient was able to perform functional habituation activities and balance tasks today with improved stability and tolerance. Patient with most dizziness with bending activities to the L, but resolved with rest. Worked on gait training with patient's cane which required some verbal and manual cues to improve. Also reviewed sequencing with stairs using cane. Patient reported understanding of all edu provided and without complaints at end of session.    OBJECTIVE IMPAIRMENTS: Abnormal gait, decreased balance, decreased coordination, decreased knowledge of use of DME, difficulty walking, decreased ROM, decreased strength, dizziness, and pain.   PLAN:  PT FREQUENCY: 2x/week  PT DURATION: 8 weeks  PLANNED INTERVENTIONS: Therapeutic exercises, Therapeutic activity, Neuromuscular re-education, Balance training, Gait training, Patient/Family education, Self Care, Joint mobilization, Stair training, Vestibular training, Canalith repositioning, DME instructions,  Aquatic Therapy, Dry Needling, Electrical stimulation, Cryotherapy, Moist heat, Taping, Manual therapy, and Re-evaluation  PLAN FOR NEXT SESSION: plan to progress from seated head motions to gaze adaptation again when he can tolerate more activity (currently not doing regular HEP b/c of HA with activities), standing activities as tolerated; gait training with cane   Janene Harvey, PT, DPT 06/05/22 5:09 PM  [facility] [address] [place],  [address] Phone # [phone] Fax # [phone]   PHYSICAL THERAPY DISCHARGE SUMMARY  Visits from Start of Care: 12  Current functional level related to goals / functional outcomes: Unable to assess; patient did not return   Remaining deficits: Unable to assess   Education / Equipment: HEP  Plan: Patient agrees to discharge.  Patient goals were not met. Patient is being discharged due to not returning to PT.       Janene Harvey, PT, DPT 07/08/22 4:02 PM  [facility] [address] [place], [place],  [address] Phone # [phone] Fax # [phone]

## 2022-06-04 NOTE — Therapy (Signed)
OUTPATIENT OCCUPATIONAL THERAPY  Treatment Note  Patient Name: Christopher Yu MRN: [id number] DOB:[birth year], 62 y.o., male Today's Date: 06/04/2022  PCP: Kirby Funk, MD REFERRING PROVIDER: Lisbeth Renshaw, MD  END OF SESSION:  OT End of Session - 06/04/22 1537     Visit Number 9    Number of Visits 17    Date for OT Re-Evaluation 06/20/22    Authorization Type [employer]    OT Start Time 1537    OT Stop Time 1617    OT Time Calculation (min) 40 min    Activity Tolerance Patient tolerated treatment well    Behavior During Therapy [facility] for tasks assessed/performed;Flat affect                   Past Medical History:  Diagnosis Date   Anxiety    Arthritis, rheumatoid (HCC)    dx 1988   CAD (coronary artery disease), native coronary artery    Mild LAD disease with calcification noted at cath 12//27/16    Cardiomyopathy [facility])    Chronic systolic heart failure (HCC) 05/01/2015   Depression    Deviated nasal septum 06/25/2011   Headache    Hyperlipidemia    Impingement syndrome of left shoulder 12/22/2017   Rheumatoid aortitis    Sleep apnea    does not use cpap - UNABLE TO TOLERATE MASK   Sleep apnea in adult    deviated septum repaired, most recent sleep study was negative   Status post total right knee replacement 06/01/2015   Past Surgical History:  Procedure Laterality Date   ANKLE FUSION Right 2014   related to his arthritis   ANKLE FUSION Left 05/25/13   related to his arthritis   APPLICATION OF CRANIAL NAVIGATION N/A 04/11/2022   Procedure: APPLICATION OF CRANIAL NAVIGATION;  Surgeon: Lisbeth Renshaw, MD;  Location: [facility] OR;  Service: Neurosurgery;  Laterality: N/A;   CARDIAC CATHETERIZATION N/A 05/01/2015   Procedure: Left Heart Cath and Coronary Angiography;  Surgeon: Lyn Records, MD;  Location: [facility] INVASIVE CV LAB;  Service: Cardiovascular;  Laterality: N/A;   CRANIOTOMY N/A 04/11/2022   Procedure: STEREOTACTIC SUBOCCIPITAL CRANIOTOMY  FOR RESECTION OF ARTERIO-VENOUS MALFORMATION;  Surgeon: Lisbeth Renshaw, MD;  Location: [facility] OR;  Service: Neurosurgery;  Laterality: N/A;   FRACTURE SURGERY     left femur fracture x 3   IR ANGIO EXTERNAL CAROTID SEL EXT CAROTID BILAT MOD SED  01/28/2022   IR ANGIO INTRA EXTRACRAN SEL INTERNAL CAROTID BILAT MOD SED  01/28/2022   IR ANGIO INTRA EXTRACRAN SEL INTERNAL CAROTID BILAT MOD SED  04/16/2022   IR ANGIO VERTEBRAL SEL VERTEBRAL BILAT MOD SED  01/28/2022   IR ANGIO VERTEBRAL SEL VERTEBRAL BILAT MOD SED  04/16/2022   KNEE ARTHROSCOPY     right x 4   NASAL SEPTOPLASTY W/ TURBINOPLASTY  06/25/2011   Procedure: NASAL SEPTOPLASTY WITH TURBINATE REDUCTION;  Surgeon: Osborn Coho, MD;  Location: [facility] OR;  Service: ENT;  Laterality: Bilateral;   TONSILLECTOMY     TOTAL KNEE ARTHROPLASTY Right 06/01/2015   Procedure: RIGHT TOTAL KNEE ARTHROPLASTY;  Surgeon: Kathryne Hitch, MD;  Location: [facility] ORS;  Service: Orthopedics;  Laterality: Right;  Block+general   Patient Active Problem List   Diagnosis Date Noted   Status post craniotomy 04/11/2022   AVM (arteriovenous malformation) brain 04/11/2022   Impingement syndrome of left shoulder 12/22/2017   Impingement syndrome of right shoulder 12/22/2017   Rheumatoid arthritis involving right knee (HCC) 06/01/2015   Status post  total right knee replacement 06/01/2015   Cardiomyopathy ([place]) 05/02/2015   Hyperlipidemia    Sleep apnea in adult    Rheumatoid arthritis ([place])    Chronic systolic heart failure ([place]) 05/01/2015   CAD (coronary artery disease), native coronary artery     ONSET DATE: 04/11/22  REFERRING DIAG: R48.546 (ICD-10-CM) - Status post craniotomy Q28.2 (ICD-10-CM) - AVM (arteriovenous malformation) brain  THERAPY DIAG:  Muscle weakness (generalized)  Unsteadiness on feet  Other abnormalities of gait and mobility  Other lack of coordination  Rationale for Evaluation and Treatment: Rehabilitation  SUBJECTIVE:    SUBJECTIVE STATEMENT: Pt reports taking tylenol about every 6 hours to keep headache at [place].   Pt accompanied by: self and significant other  PERTINENT HISTORY:  Pt is s/p suboccipital craniotomy for resection of cerebellar AVM. PMH: RA, anxiety, CAD PSH: L TKA  PRECAUTIONS: Other: no bending, twisting, lifting > 10#  WEIGHT BEARING RESTRICTIONS:  no lifting > 10#  PAIN:  Are you having pain? No  FALLS: Has patient fallen in last 6 months? No  LIVING ENVIRONMENT: Lives with: lives with their family (wife, and 6 and 72 yo daughters) Lives in: House/apartment Stairs: Yes: Internal: full flight of steps to home office, however is currently not having to navigate steps; on right going up and External: 3 steps; on right going up Has following equipment at home: Gilford Rile - 2 wheeled and shower chair  PLOF: Independent, Independent with basic ADLs, Independent with household mobility without device, and Vocation/Vocational requirements: working as a Freight forwarder in [employer], requires occasional travel by plane  PATIENT GOALS: to be able to move without having to use a walker  OBJECTIVE:   HAND DOMINANCE: Right  ADLs: Transfers/ambulation related to ADLs: Min A with RW Eating: wife will assist with cutting up foods, opening items, and occasionally placing items in his hand Grooming: Supervision when standing at sink UB Dressing: Min assist LB Dressing: Mod-max assist Toileting: wife will assist with pulling up pants/hygiene, min assist for transfers off toilet Bathing: supervision/cues for bathing, wife completes 5% Tub Shower transfers: Mod-max assist transfers in/out of shower Equipment: Shower seat without back, Walk in shower, Long handled shoe horn, and Long handled sponge   MOBILITY STATUS: Needs Assist: Min A with RW  ACTIVITY TOLERANCE: Activity tolerance: diminished  FUNCTIONAL OUTCOME MEASURES: Ron Parker Index of Independence in ADLs:2/6  COORDINATION: Finger  Nose Finger test: overshooting mildly with LUE 9 Hole Peg test: Right: 36.38 sec; Left: 1:00.87 sec Box and Blocks:  Right 41 blocks, Left 35 blocks  COGNITION: Overall cognitive status: Difficulty to assess due to: flat affect and decreased engagement in tasks s/p surgery Pt reports that he has been avoiding his phone, television, and bill paying, anything that makes him think. Spouse reports he is on a lot of medications which may also be impacting his arousal/cognition.    VISION ASSESSMENT: To be further assessed in functional context Ocular ROM: WFL Tracking/Visual pursuits: Able to track stimulus in all quads without difficulty Saccades: additional eye shifts occurred during testing, decreased speed of saccadic movements, and additional eye shifts in B eyes when looking to L  Patient has difficulty with following activities due to following visual impairments: is currently not reading, looking at phone or tv  OBSERVATIONS: flat affect, closing eyes frequently   TODAY'S TREATMENT:  06/04/22 Dynamic standing: tolerated standing 9 mins while completing jigsaw puzzle with no onset of dizziness, instability, or headache.  Pt utilizing LUE to pick up jigsaw puzzle pieces and BUE when placing into puzzle. Pt demonstrating mild coordination deficits with large puzzle pieces, and requiring increased time with sequencing when assembling 24 piece puzzle. Coordination: utilized Leisure centre manager with LUE on level 75# and then 50# to pick up 1 inch blocks to address motor control while maintaining sustained grasp. Pt reports improved ease with less resistance, however continues to demonstrate decreased motor control and coordination despite resistance amount. Engaged in stacking of blocks with LUE with focus on use of wrist weight vs stabilizing at elbow and/or forearm.  Pt demonstrating improved coordination with stabilization at table top or  along torso. ADL: reiterated BUE tasks to complete at home to continue to address coordination as pt not overly motivated to engage in coins and other small tasks.  Pt preferring more functional tasks.    06/02/22 Return to driving: discussed typical return to driving recommendations.  Pt reports surgeon deferring decision to return to driving to PT/OT.  Discussed return to driving recommendations with driving in parking lot > neighborhood > back roads, etc.  OT also educated on in clinic and comprehensive drivers evaluations if needed.  Provided with printouts of above. Ball toss with BUE while completing cognitive dual tasking to name items based on letter on ball.  Pt demonstrating mild improved coordination with ball toss with BUE compared to previous session. FMC: completed small peg board pattern replication with LUE with focus on motor control and coordination.  Pt dropping 20% of pegs due to small size and decreased coordination.  Transitioned to tangram activity with challenge to pick up pieces to fit into picture.  Pt frequently dragging pieces to edge of table, OT encouraging pt to attempt to pick up.  Completed second tangram picture with pt successfully picking up pieces without dragging.  OT encouraged pt to engage in jigsaw puzzles with use of nondominant LUE 25-50% of time.   05/30/22 Shower transfers: Pt reports that he is getting in the shower with Min- steady assist when stepping over shower ledge and then supervision for bathing.  Pt reports that he starts out standing, but then has shower chair to sit as he fatigues.  Pt states that he stands to wash his hair standing, but then sits when applying soap to wash cloth and then remains seated.   Cup stacking: engaged in stacking cups in pyramid shape with LUE with focus on gross motor control.  Pt completing initial pyramid stack with good effort and only dropping one.  OT then increased challenge to rotating cups in finger tips prior to  stacking. Pt then dropping x3 due to increased gross and fine motor control challenge.  Incorporated reaching across midline and out to L side to challenge weight shifting and functional reach.   Ball toss: toss/catch with ipsilateral LUE as well as tossing R <> L UE.  OT educating on motor control as well as visual component to task.  Pt demonstrating improved motor control and decreased drops with use of "juggling" ball due to increased weight for additional proprioception during toss/catch. BUE use: engaged in chest pass with soccer ball with focus on symmetry with tossing.  OT increasing challenge by moving to facilitate grading of movement and motor control when tossing/catching.  Engaged in folding towels with focus on use of BUE in tandem and with reciprocal movements.  PATIENT EDUCATION: Education details: ongoing condition specific education Person educated: Patient and Spouse Education method: Explanation Education comprehension: verbalized understanding and needs further education  HOME EXERCISE PROGRAM: Coordination HEP  Functional tasks for LUE/BUE (see pt instructions)   GOALS: Goals reviewed with patient? Yes  SHORT TERM GOALS: Target date: 05/23/22  Pt will be independent in HEP for fine and gross motor control. Baseline: Goal status: IN PROGRESS  2.  Pt will verbalize understanding of adaptive strategies, task modifications, and/or potential A/E needs to increase ease, safety, and independence w/ ADLs. Baseline:  Goal status: MET - 05/28/22  3.  Pt will demonstrate improved ease with sit > stand to get up from toilet or other seating options in home by improving score on sit > stand to decreased fall risk.  Baseline: time TBD Goal status: MET - 18.19 sec on 05/28/22 and pt and spouse report improved mobility in home  4.  Pt will perform dynamic standing task for 5 mins as needed for simple IADLs w/o LOB using DME and/or countertop support prn   Baseline:  Goal  status: MET - 05/28/22  5.  Pt will demonstrate improved fine motor coordination for ADLs as evidenced by decreasing 9 hole peg test score for LUE by 3 secs  Baseline:9 Hole Peg test: Right: 36.38 sec; Left: 1:00.87 sec  Goal status: MET - 52.7 sec on 05/28/22   LONG TERM GOALS: Target date: 06/20/22  Pt will demonstrate shower transfers with DME PRN at supervision level. Baseline: currently sponge bathing due to fearfulness of showering, but wants to return to showers.   Goal status: IN PROGRESS - Min - Steady A for transfers on 05/30/22   Pt will report increased participation in IADLs (such as cooking) to increase independence with functional IADLs.  Baseline:  Goal status: IN PROGRESS   Pt will complete UB dressing, to include clothing fasteners, at Mod I level with use of AE and/or alternative strategies PRN. Baseline:  Goal status: IN PROGRESS   Pt will complete LB dressing with Supervision/setup with use of AE and/or alternative strategies PRN. Baseline:  Goal status: IN PROGRESS   Pt will demonstrate improved fine motor coordination for ADLs as evidenced by decreasing 9 hole peg test score for LUE by 10 secs to increase participation in medication management. Baseline: Goal status: IN PROGRESS  ASSESSMENT:  CLINICAL IMPRESSION: Pt tolerated increased standing this session without dizziness or onset of instability.  Pt continues to demonstrate min-mod coordination impairments with LUE, with gross and fine motor control, however demonstrating good problem solving and adaptive strategies.  Engaged in problem solving to decrease impact of motor control on tasks and increase engagement in functional coordination tasks at home.  PERFORMANCE DEFICITS: in functional skills including ADLs, IADLs, coordination, pain, Fine motor control, Gross motor control, balance, body mechanics, endurance, decreased knowledge of precautions, decreased knowledge of use of DME, vision, and UE functional  use, cognitive skills including energy/drive, problem solving, and thought, and psychosocial skills including environmental adaptation, habits, and routines and behaviors.   IMPAIRMENTS: are limiting patient from ADLs and IADLs.   CO-MORBIDITIES: may have co-morbidities  that affects occupational performance. Patient will benefit from skilled OT to address above impairments and improve overall function.  MODIFICATION OR ASSISTANCE TO COMPLETE EVALUATION: Min-Moderate modification of tasks or assist with assess necessary to complete an evaluation.  OT OCCUPATIONAL PROFILE AND HISTORY: Detailed assessment: Review of records and additional review of physical, cognitive, psychosocial history related to current functional performance.  CLINICAL DECISION MAKING: Moderate - several treatment options, min-mod task modification necessary  REHAB POTENTIAL: Good  EVALUATION COMPLEXITY: Moderate    PLAN:  OT FREQUENCY: 2x/week  OT DURATION: 8 weeks  PLANNED INTERVENTIONS: self care/ADL training, therapeutic exercise, therapeutic activity, neuromuscular re-education, balance training, functional mobility training, compression bandaging, moist heat, cryotherapy, patient/family education, cognitive remediation/compensation, visual/perceptual remediation/compensation, psychosocial skills training, energy conservation, coping strategies training, and DME and/or AE instructions  RECOMMENDED OTHER SERVICES: NA  CONSULTED AND AGREED WITH PLAN OF CARE: Patient and family member/caregiver  PLAN FOR NEXT SESSION: Initiate vision exercises (for saccades), educate on AE/DME for LB bathing/dressing, functional transfers for shower transfers, coordination, dynamic standing   Saleah Rishel, [place], OTR/L 06/04/2022, 3:39 PM

## 2022-06-05 ENCOUNTER — Encounter: Payer: Self-pay | Admitting: Physical Therapy

## 2022-06-05 ENCOUNTER — Ambulatory Visit: Payer: BC Managed Care – PPO | Attending: Neurosurgery | Admitting: Physical Therapy

## 2022-06-05 DIAGNOSIS — R2689 Other abnormalities of gait and mobility: Secondary | ICD-10-CM | POA: Insufficient documentation

## 2022-06-05 DIAGNOSIS — R2681 Unsteadiness on feet: Secondary | ICD-10-CM | POA: Diagnosis not present

## 2022-06-05 DIAGNOSIS — R42 Dizziness and giddiness: Secondary | ICD-10-CM | POA: Insufficient documentation

## 2022-06-05 DIAGNOSIS — R41842 Visuospatial deficit: Secondary | ICD-10-CM | POA: Diagnosis not present

## 2022-06-05 DIAGNOSIS — R278 Other lack of coordination: Secondary | ICD-10-CM | POA: Diagnosis not present

## 2022-06-05 DIAGNOSIS — M6281 Muscle weakness (generalized): Secondary | ICD-10-CM | POA: Insufficient documentation

## 2022-06-09 ENCOUNTER — Ambulatory Visit: Payer: BC Managed Care – PPO | Admitting: Occupational Therapy

## 2022-06-09 ENCOUNTER — Ambulatory Visit: Payer: BC Managed Care – PPO

## 2022-06-09 DIAGNOSIS — R41842 Visuospatial deficit: Secondary | ICD-10-CM

## 2022-06-09 DIAGNOSIS — R278 Other lack of coordination: Secondary | ICD-10-CM | POA: Diagnosis not present

## 2022-06-09 DIAGNOSIS — R42 Dizziness and giddiness: Secondary | ICD-10-CM | POA: Diagnosis not present

## 2022-06-09 DIAGNOSIS — R2689 Other abnormalities of gait and mobility: Secondary | ICD-10-CM | POA: Diagnosis not present

## 2022-06-09 DIAGNOSIS — M6281 Muscle weakness (generalized): Secondary | ICD-10-CM

## 2022-06-09 DIAGNOSIS — R2681 Unsteadiness on feet: Secondary | ICD-10-CM

## 2022-06-09 NOTE — Therapy (Addendum)
OUTPATIENT OCCUPATIONAL THERAPY  Treatment Note  Patient Name: Christopher Yu MRN: [id number] DOB:1961/02/17, 62 y.o., male Today's Date: 06/09/2022  PCP: Lavone Orn, MD REFERRING PROVIDER: Consuella Lose, MD  END OF SESSION:  OT End of Session - 06/09/22 1536     Visit Number 10    Number of Visits 17    Date for OT Re-Evaluation 06/20/22    Authorization Type BC[employer]    OT Start Time [id number]    OT Stop Time [id number]    OT Time Calculation (min) 40 min    Activity Tolerance Patient tolerated treatment well    Behavior During Therapy [facility] for tasks assessed/performed;Flat affect                    Past Medical History:  Diagnosis Date   Anxiety    Arthritis, rheumatoid ([place])    dx 1988   CAD (coronary artery disease), native coronary artery    Mild LAD disease with calcification noted at cath 12//27/16    Cardiomyopathy [facility])    Chronic systolic heart failure ([place]) 05/01/2015   Depression    Deviated nasal septum 06/25/2011   Headache    Hyperlipidemia    Impingement syndrome of left shoulder 12/22/2017   Rheumatoid aortitis    Sleep apnea    does not use cpap - UNABLE TO TOLERATE MASK   Sleep apnea in adult    deviated septum repaired, most recent sleep study was negative   Status post total right knee replacement 06/01/2015   Past Surgical History:  Procedure Laterality Date   ANKLE FUSION Right 2014   related to his arthritis   ANKLE FUSION Left 05/25/13   related to his arthritis   APPLICATION OF CRANIAL NAVIGATION N/A 04/11/2022   Procedure: APPLICATION OF CRANIAL NAVIGATION;  Surgeon: Consuella Lose, MD;  Location: [place];  Service: Neurosurgery;  Laterality: N/A;   CARDIAC CATHETERIZATION N/A 05/01/2015   Procedure: Left Heart Cath and Coronary Angiography;  Surgeon: Belva Crome, MD;  Location: [place] [facility];  Service: Cardiovascular;  Laterality: N/A;   CRANIOTOMY N/A 04/11/2022   Procedure: STEREOTACTIC SUBOCCIPITAL  CRANIOTOMY FOR RESECTION OF ARTERIO-VENOUS MALFORMATION;  Surgeon: Consuella Lose, MD;  Location: [place];  Service: Neurosurgery;  Laterality: N/A;   FRACTURE SURGERY     left femur fracture x 3   IR ANGIO EXTERNAL CAROTID SEL EXT CAROTID BILAT MOD SED  01/28/2022   IR ANGIO INTRA EXTRACRAN SEL INTERNAL CAROTID BILAT MOD SED  01/28/2022   IR ANGIO INTRA EXTRACRAN SEL INTERNAL CAROTID BILAT MOD SED  04/16/2022   IR ANGIO VERTEBRAL SEL VERTEBRAL BILAT MOD SED  01/28/2022   IR ANGIO VERTEBRAL SEL VERTEBRAL BILAT MOD SED  04/16/2022   KNEE ARTHROSCOPY     right x 4   NASAL SEPTOPLASTY W/ TURBINOPLASTY  06/25/2011   Procedure: NASAL SEPTOPLASTY WITH TURBINATE REDUCTION;  Surgeon: Jerrell Belfast, MD;  Location: [place];  Service: ENT;  Laterality: Bilateral;   TONSILLECTOMY     TOTAL KNEE ARTHROPLASTY Right 06/01/2015   Procedure: RIGHT TOTAL KNEE ARTHROPLASTY;  Surgeon: Mcarthur Rossetti, MD;  Location: [facility];  Service: Orthopedics;  Laterality: Right;  Block+general   Patient Active Problem List   Diagnosis Date Noted   Status post craniotomy 04/11/2022   AVM (arteriovenous malformation) brain 04/11/2022   Impingement syndrome of left shoulder 12/22/2017   Impingement syndrome of right shoulder 12/22/2017   Rheumatoid arthritis involving right knee ([place]) 06/01/2015   Status  post total right knee replacement 06/01/2015   Cardiomyopathy ([place]) 05/02/2015   Hyperlipidemia    Sleep apnea in adult    Rheumatoid arthritis ([place])    Chronic systolic heart failure ([place]) 05/01/2015   CAD (coronary artery disease), native coronary artery     ONSET DATE: 04/11/22  REFERRING DIAG: [id number] (ICD-10-CM) - Status post craniotomy Q28.2 (ICD-10-CM) - AVM (arteriovenous malformation) brain  THERAPY DIAG:  Muscle weakness (generalized)  Unsteadiness on feet  Other lack of coordination  Visuospatial deficit  Rationale for Evaluation and Treatment: Rehabilitation  SUBJECTIVE:   SUBJECTIVE  STATEMENT: Pt reports increased head pain and mood swings, spouse reports they are tapering his prednisone with today being last dose.   Pt accompanied by: self and significant other  PERTINENT HISTORY:  Pt is s/p suboccipital craniotomy for resection of cerebellar AVM. PMH: RA, anxiety, CAD PSH: L TKA  PRECAUTIONS: Other: no bending, twisting, lifting > 10#  WEIGHT BEARING RESTRICTIONS:  no lifting > 10#  PAIN:  Are you having pain? Yes: NPRS scale: 3/10 Pain location: head Pain description: headache, constant Aggravating factors: bending forward Relieving factors: ice  FALLS: Has patient fallen in last 6 months? No  LIVING ENVIRONMENT: Lives with: lives with their family (wife, and 89 and 48 yo daughters) Lives in: House/apartment Stairs: Yes: Internal: full flight of steps to home office, however is currently not having to navigate steps; on right going up and External: 3 steps; on right going up Has following equipment at home: [place]ford Rile - 2 wheeled and shower chair  PLOF: Independent, Independent with basic ADLs, Independent with household mobility without device, and Vocation/Vocational requirements: working as a Freight forwarder in [employer], requires occasional travel by plane  PATIENT GOALS: to be able to move without having to use a walker  OBJECTIVE:   HAND DOMINANCE: Right  ADLs: Transfers/ambulation related to ADLs: Min A with RW Eating: wife will assist with cutting up foods, opening items, and occasionally placing items in his hand Grooming: Supervision when standing at sink UB Dressing: Min assist LB Dressing: Mod-max assist Toileting: wife will assist with pulling up pants/hygiene, min assist for transfers off toilet Bathing: supervision/cues for bathing, wife completes 5% Tub Shower transfers: Mod-max assist transfers in/out of shower Equipment: Shower seat without back, Walk in shower, Long handled shoe horn, and Long handled sponge   MOBILITY STATUS:  Needs Assist: Min A with RW  ACTIVITY TOLERANCE: Activity tolerance: diminished  FUNCTIONAL OUTCOME MEASURES: Ron Parker Index of Independence in ADLs:2/6  COORDINATION: Finger Nose Finger test: overshooting mildly with LUE 9 Hole Peg test: Right: 36.38 sec; Left: 1:00.87 sec Box and Blocks:  Right 41 blocks, Left 35 blocks  COGNITION: Overall cognitive status: Difficulty to assess due to: flat affect and decreased engagement in tasks s/p surgery Pt reports that he has been avoiding his phone, television, and bill paying, anything that makes him think. Spouse reports he is on a lot of medications which may also be impacting his arousal/cognition.    VISION ASSESSMENT: To be further assessed in functional context Ocular ROM: WFL Tracking/Visual pursuits: Able to track stimulus in all quads without difficulty Saccades: additional eye shifts occurred during testing, decreased speed of saccadic movements, and additional eye shifts in B eyes when looking to L  Patient has difficulty with following activities due to following visual impairments: is currently not reading, looking at phone or tv  OBSERVATIONS: flat affect, closing eyes frequently   TODAY'S TREATMENT:  06/09/22 Complex planning/organization: engaged in complex planning/problem solving prompt requiring pt to organize items into systematic schedule to complete all in time allotted.  Discussed prioritizing activities to ensure completion of tasks.  Pt requiring OT to read prompt, while pt listing all tasks and then placing stops in order.  Pt reporting significant change in handwriting, however still legible.  OT discussed impact of headache and speed on handwriting.  Pt with decreased recall of information impacting correct organization of stops, but able to correct when reminded and pt making good rationales for particular order of stops.  OT encouraged pt to prioritize daily  tasks to increase initiation and participation in more complex sequencing.  IADLs: discussed alternative methods to allow pt to increase participation in household tasks such as laundry.  Discussed sitting vs standing and use of reacher to retrieve clothing from dryer.  OT recommended placing laundry basket on top of dryer to decrease need to bend forward.  Even recommended sitting to fold laundry in laundry room and then wife to transfer items to correct location as pt still not stable to carry laundry basket without AD. Handwriting: encouraged pt to increase participation in handwriting activities, with attempts to identify purposeful and/or motivating tasks to increase participation.  Discussed writing grocery list, reminders for upcoming PCP appt, and/or writing down sports scores/stats.   06/04/22 Dynamic standing: tolerated standing 9 mins while completing jigsaw puzzle with no onset of dizziness, instability, or headache.  Pt utilizing LUE to pick up jigsaw puzzle pieces and BUE when placing into puzzle. Pt demonstrating mild coordination deficits with large puzzle pieces, and requiring increased time with sequencing when assembling 24 piece puzzle. Coordination: utilized Psychologist, forensic with LUE on level 75# and then 50# to pick up 1 inch blocks to address motor control while maintaining sustained grasp. Pt reports improved ease with less resistance, however continues to demonstrate decreased motor control and coordination despite resistance amount. Engaged in [place] of blocks with LUE with focus on use of wrist weight vs stabilizing at elbow and/or forearm.  Pt demonstrating improved coordination with stabilization at table top or along torso. ADL: reiterated BUE tasks to complete at home to continue to address coordination as pt not overly motivated to engage in coins and other small tasks.  Pt preferring more functional tasks.    06/02/22 Return to driving: discussed typical return to driving  recommendations.  Pt reports surgeon deferring decision to return to driving to PT/OT.  Discussed return to driving recommendations with driving in parking lot > neighborhood > back roads, etc.  OT also educated on in clinic and comprehensive drivers evaluations if needed.  Provided with printouts of above. Ball toss with BUE while completing cognitive dual tasking to name items based on letter on ball.  Pt demonstrating mild improved coordination with ball toss with BUE compared to previous session. [place]: completed small peg board pattern replication with LUE with focus on motor control and coordination.  Pt dropping 20% of pegs due to small size and decreased coordination.  Transitioned to tangram activity with challenge to pick up pieces to fit into picture.  Pt frequently dragging pieces to edge of table, OT encouraging pt to attempt to pick up.  Completed second tangram picture with pt successfully picking up pieces without dragging.  OT encouraged pt to engage in jigsaw puzzles with use of nondominant LUE 25-50% of time.   PATIENT EDUCATION: Education details: ongoing condition specific education Person educated: Patient and Spouse Education method: Explanation Education comprehension:  verbalized understanding and needs further education  HOME EXERCISE PROGRAM: Coordination HEP  Functional tasks for LUE/BUE (see pt instructions)   GOALS: Goals reviewed with patient? Yes  SHORT TERM GOALS: Target date: 05/23/22  Pt will be independent in HEP for fine and gross motor control. Baseline: Goal status: IN PROGRESS  2.  Pt will verbalize understanding of adaptive strategies, task modifications, and/or potential A/E needs to increase ease, safety, and independence w/ ADLs. Baseline:  Goal status: MET - 05/28/22  3.  Pt will demonstrate improved ease with sit > stand to get up from toilet or other seating options in home by improving score on sit > stand to decreased fall risk.  Baseline:  time TBD Goal status: MET - 18.19 sec on 05/28/22 and pt and spouse report improved mobility in home  4.  Pt will perform dynamic standing task for 5 mins as needed for simple IADLs w/o LOB using DME and/or countertop support prn   Baseline:  Goal status: MET - 05/28/22  5.  Pt will demonstrate improved fine motor coordination for ADLs as evidenced by decreasing 9 hole peg test score for LUE by 3 secs  Baseline:9 Hole Peg test: Right: 36.38 sec; Left: 1:00.87 sec  Goal status: MET - 52.7 sec on 05/28/22   LONG TERM GOALS: Target date: 06/20/22  Pt will demonstrate shower transfers with DME PRN at supervision level. Baseline: currently sponge bathing due to fearfulness of showering, but wants to return to showers.   Goal status: IN PROGRESS - Min - Steady A for transfers on 05/30/22   Pt will report increased participation in IADLs (such as cooking) to increase independence with functional IADLs.  Baseline:  Goal status: IN PROGRESS   Pt will complete UB dressing, to include clothing fasteners, at Mod I level with use of AE and/or alternative strategies PRN. Baseline:  Goal status: IN PROGRESS   Pt will complete LB dressing with Supervision/setup with use of AE and/or alternative strategies PRN. Baseline:  Goal status: IN PROGRESS   Pt will demonstrate improved fine motor coordination for ADLs as evidenced by decreasing 9 hole peg test score for LUE by 10 secs to increase participation in medication management. Baseline: Goal status: IN PROGRESS  ASSESSMENT:  CLINICAL IMPRESSION: Pt verbalizing increased headache, mood swings, and overall fatigue this session.  Discussed impact of decreased sleep on moods and overall demeanor as pt more frustrated with performance this session.  Pt reporting decreased motivation to engage in previously enjoyable activities as he cannot complete them to the full extent that he could prior to surgery.  OT and spouse encouraged pt to break down tasks and  complete those tasks to focus on improved mobility, coordination, and activity tolerance.    PERFORMANCE DEFICITS: in functional skills including ADLs, IADLs, coordination, pain, Fine motor control, Gross motor control, balance, body mechanics, endurance, decreased knowledge of precautions, decreased knowledge of use of DME, vision, and UE functional use, cognitive skills including energy/drive, problem solving, and thought, and psychosocial skills including environmental adaptation, habits, and routines and behaviors.   IMPAIRMENTS: are limiting patient from ADLs and IADLs.   CO-MORBIDITIES: may have co-morbidities  that affects occupational performance. Patient will benefit from skilled OT to address above impairments and improve overall function.  MODIFICATION OR ASSISTANCE TO COMPLETE EVALUATION: Min-Moderate modification of tasks or assist with assess necessary to complete an evaluation.  OT OCCUPATIONAL PROFILE AND HISTORY: Detailed assessment: Review of records and additional review of physical, cognitive, psychosocial history related  to current functional performance.  CLINICAL DECISION MAKING: Moderate - several treatment options, min-mod task modification necessary  REHAB POTENTIAL: Good  EVALUATION COMPLEXITY: Moderate    PLAN:  OT FREQUENCY: 2x/week  OT DURATION: 8 weeks  PLANNED INTERVENTIONS: self care/ADL training, therapeutic exercise, therapeutic activity, neuromuscular re-education, balance training, functional mobility training, compression bandaging, moist heat, cryotherapy, patient/family education, cognitive remediation/compensation, visual/perceptual remediation/compensation, psychosocial skills training, energy conservation, coping strategies training, and DME and/or AE instructions  RECOMMENDED OTHER SERVICES: NA  CONSULTED AND AGREED WITH PLAN OF CARE: Patient and family member/caregiver  PLAN FOR NEXT SESSION: Initiate vision exercises (for saccades),  educate on AE/DME for LB bathing/dressing, functional transfers for shower transfers, coordination, dynamic standing   Keilyn Haggard, [place], OTR/L 06/09/2022, 4:29 PM  OCCUPATIONAL THERAPY DISCHARGE SUMMARY  Visits from Start of Care: 10  Current functional level related to goals / functional outcomes: Unable to assess; pt did not return since visit 06/09/22.  Pt with increased concerns about swelling at surgical site and cancelled additional visits.     Remaining deficits: Impaired coordination and decreased attention to task   Education / Equipment: Energy conservation, coordination HEP   Patient agrees to discharge. Patient goals were not met. Patient is being discharged due to not returning since the last visit.Marland Kitchen   Simonne Come OTR/L 07/09/22

## 2022-06-10 ENCOUNTER — Ambulatory Visit: Payer: BC Managed Care – PPO

## 2022-06-11 ENCOUNTER — Ambulatory Visit: Payer: BC Managed Care – PPO | Admitting: Occupational Therapy

## 2022-06-11 ENCOUNTER — Ambulatory Visit: Payer: BC Managed Care – PPO

## 2022-06-11 DIAGNOSIS — I10 Essential (primary) hypertension: Secondary | ICD-10-CM | POA: Diagnosis not present

## 2022-06-11 DIAGNOSIS — Q059 Spina bifida, unspecified: Secondary | ICD-10-CM | POA: Diagnosis not present

## 2022-06-11 DIAGNOSIS — Q282 Arteriovenous malformation of cerebral vessels: Secondary | ICD-10-CM | POA: Diagnosis not present

## 2022-06-12 ENCOUNTER — Ambulatory Visit: Payer: BC Managed Care – PPO

## 2022-06-12 DIAGNOSIS — G96198 Other disorders of meninges, not elsewhere classified: Secondary | ICD-10-CM | POA: Diagnosis not present

## 2022-06-13 ENCOUNTER — Other Ambulatory Visit (HOSPITAL_COMMUNITY): Payer: Self-pay | Admitting: Neurosurgery

## 2022-06-13 DIAGNOSIS — G96198 Other disorders of meninges, not elsewhere classified: Secondary | ICD-10-CM

## 2022-06-16 ENCOUNTER — Ambulatory Visit: Payer: BC Managed Care – PPO | Admitting: Occupational Therapy

## 2022-06-16 ENCOUNTER — Ambulatory Visit: Payer: BC Managed Care – PPO

## 2022-06-17 ENCOUNTER — Ambulatory Visit: Payer: BC Managed Care – PPO | Admitting: Physical Therapy

## 2022-06-18 ENCOUNTER — Ambulatory Visit (HOSPITAL_COMMUNITY)
Admission: RE | Admit: 2022-06-18 | Discharge: 2022-06-18 | Disposition: A | Discharge status: Home / Self Care | Payer: BC Managed Care – PPO | Source: Ambulatory Visit | Attending: Neurosurgery | Admitting: Neurosurgery

## 2022-06-18 ENCOUNTER — Ambulatory Visit: Payer: BC Managed Care – PPO | Admitting: Occupational Therapy

## 2022-06-18 ENCOUNTER — Other Ambulatory Visit (HOSPITAL_COMMUNITY): Payer: Self-pay | Admitting: Neurosurgery

## 2022-06-18 ENCOUNTER — Ambulatory Visit: Payer: BC Managed Care – PPO

## 2022-06-18 DIAGNOSIS — G96198 Other disorders of meninges, not elsewhere classified: Secondary | ICD-10-CM

## 2022-06-18 MED ORDER — LIDOCAINE HCL (PF) 1 % IJ SOLN
5.0000 mL | Freq: Once | INTRAMUSCULAR | Status: AC
  Administered 2022-06-18: 2 mL via INTRADERMAL

## 2022-06-18 NOTE — Discharge Instructions (Signed)
Myelogram and Lumbar Puncture Discharge Instructions  Go home and rest quietly for the next 24 hours.  It is important to lie flat for the next 24 hours.  Get up only to go to the restroom.  You may lie in the bed or on a couch on your back, your stomach, your left side or your right side.  You may have one pillow under your head.  You may have pillows between your knees while you are on your side or under your knees while you are on your back.  DO NOT drive today.  Recline the seat as far back as it will go, while still wearing your seat belt, on the way home.  You may get up to go to the bathroom as needed.  You may sit up for 10 minutes to eat.  You may resume your normal diet and medications unless otherwise indicated.  The incidence of headache, nausea, or vomiting is about 5% (one in 20 patients).  If you develop a headache, lie flat and drink plenty of fluids until the headache goes away.  Caffeinated beverages may be helpful.  If you develop severe nausea and vomiting or a headache that does not go away with flat bed rest, call radiology department  You may resume normal activities after your 24 hours of bed rest is over; however, do not exert yourself strongly or do any heavy lifting tomorrow.  Call your physician for a follow-up appointment.  The results of your myelogram will be sent directly to your physician by the following day.  If you have any questions or if complications develop after you arrive home, please call radiology department  Discharge instructions have been explained to the patient.  The patient, or the person responsible for the patient, fully understands these instructions.

## 2022-06-18 NOTE — Procedures (Signed)
PROCEDURE SUMMARY:  Successful Fl guided lumbar puncture from L3-4.  Yielded 33cc of clear CSF. Opening pressure: 21 cmH2O Closing pressure: 13 cmH2O  No immediate complications.  Pt tolerated well.   EBL negligible  Joh Rao PA-C 06/18/2022 11:21 AM

## 2022-06-19 ENCOUNTER — Ambulatory Visit: Payer: BC Managed Care – PPO | Admitting: Physical Therapy

## 2022-06-20 ENCOUNTER — Encounter (HOSPITAL_COMMUNITY): Payer: Self-pay | Admitting: Interventional Cardiology

## 2022-06-23 DIAGNOSIS — Q282 Arteriovenous malformation of cerebral vessels: Secondary | ICD-10-CM | POA: Diagnosis not present

## 2022-06-23 DIAGNOSIS — G96198 Other disorders of meninges, not elsewhere classified: Secondary | ICD-10-CM | POA: Diagnosis not present

## 2022-07-02 ENCOUNTER — Telehealth (HOSPITAL_COMMUNITY): Payer: Self-pay | Admitting: Interventional Cardiology

## 2022-07-02 ENCOUNTER — Other Ambulatory Visit: Payer: Self-pay | Admitting: Interventional Cardiology

## 2022-07-02 NOTE — Telephone Encounter (Signed)
Just an FYI. We have made several attempts to contact this patient including sending a letter to schedule or reschedule their Cardiac PET CT. We will be removing the patient from the [place].   MAILED LETTER [facility]  06/20/22 [facility] to schedule @ 12:43/[facility]  left vm; LW11/13'@1148'$      Thank you

## 2022-07-02 NOTE — Telephone Encounter (Signed)
PET Scan was not approved by insurance.  Patient had exercise myoview

## 2022-07-04 ENCOUNTER — Other Ambulatory Visit: Payer: Self-pay | Admitting: Neurosurgery

## 2022-07-04 DIAGNOSIS — G96198 Other disorders of meninges, not elsewhere classified: Secondary | ICD-10-CM

## 2022-07-05 ENCOUNTER — Ambulatory Visit
Admission: RE | Admit: 2022-07-05 | Discharge: 2022-07-05 | Disposition: A | Discharge status: Home / Self Care | Payer: BC Managed Care – PPO | Source: Ambulatory Visit | Attending: Neurosurgery | Admitting: Neurosurgery

## 2022-07-05 DIAGNOSIS — G9389 Other specified disorders of brain: Secondary | ICD-10-CM | POA: Diagnosis not present

## 2022-07-05 DIAGNOSIS — G96198 Other disorders of meninges, not elsewhere classified: Secondary | ICD-10-CM

## 2022-07-05 DIAGNOSIS — Z9889 Other specified postprocedural states: Secondary | ICD-10-CM | POA: Diagnosis not present

## 2022-07-08 ENCOUNTER — Other Ambulatory Visit (HOSPITAL_COMMUNITY): Payer: BC Managed Care – PPO

## 2022-07-09 ENCOUNTER — Other Ambulatory Visit: Payer: Self-pay | Admitting: Neurosurgery

## 2022-07-09 DIAGNOSIS — G96198 Other disorders of meninges, not elsewhere classified: Secondary | ICD-10-CM | POA: Diagnosis not present

## 2022-07-09 DIAGNOSIS — Z6826 Body mass index (BMI) 26.0-26.9, adult: Secondary | ICD-10-CM | POA: Diagnosis not present

## 2022-07-15 NOTE — Pre-Procedure Instructions (Signed)
Surgical Instructions    Your procedure is scheduled on Friday 07/18/22.   Report to [facility] Main Entrance "A" at 11:30 A.M., then check in with the Admitting office.  Call this number if you have problems the morning of surgery:  [phone]   If you have any questions prior to your surgery date call [phone]: Open Monday-Friday 8am-4pm If you experience any cold or flu symptoms such as cough, fever, chills, shortness of breath, etc. between now and your scheduled surgery, please notify us at the above number     Remember:  Do not eat after midnight the night before your surgery  You may drink clear liquids until 10:30 A.M. the morning of your surgery.   Clear liquids allowed are: Water, Non-Citrus Juices (without pulp), Carbonated Beverages, Clear Tea, Black Coffee ONLY (NO MILK, CREAM OR POWDERED CREAMER of any kind), and Gatorade    Take these medicines the morning of surgery with A SIP OF WATER:   gabapentin (NEURONTIN)   metoprolol succinate (TOPROL-XL)   rosuvastatin (CRESTOR)   sertraline (ZOLOFT)    Take these medicines if needed:   diazepam (VALIUM)   Propylene Glycol (SYSTANE COMPLETE)   Please follow your surgeon's instructions regarding sulfaSALAzine (AZULFIDINE) and Aspirin. If you have not received instructions then please contact your surgeon's office for instructions.   As of today, STOP taking any Aspirin (unless otherwise instructed by your surgeon) Aleve, Naproxen, Ibuprofen, Motrin, Advil, Goody's, BC's, all herbal medications, fish oil, and all vitamins.           Do not wear jewelry or makeup. Do not wear lotions, powders, perfumes/cologne or deodorant. Do not shave 48 hours prior to surgery.  Men may shave face and neck. Do not bring valuables to the hospital. Do not wear nail polish, gel polish, artificial nails, or any other type of covering on natural nails (fingers and toes) If you have artificial nails or gel coating that need to be removed  by a nail salon, please have this removed prior to surgery. Artificial nails or gel coating may interfere with anesthesia's ability to adequately monitor your vital signs.  [facility] is not responsible for any belongings or valuables.    Do NOT Smoke (Tobacco/Vaping)  24 hours prior to your procedure  If you use a CPAP at night, you may bring your mask for your overnight stay.   Contacts, glasses, hearing aids, dentures or partials may not be worn into surgery, please bring cases for these belongings   For patients admitted to the hospital, discharge time will be determined by your treatment team.   Patients discharged the day of surgery will not be allowed to drive home, and someone needs to stay with them for 24 hours.   SURGICAL WAITING ROOM VISITATION Patients having surgery or a procedure may have no more than 2 support people in the waiting area - these visitors may rotate.   Children under the age of 30 must have an adult with them who is not the patient. If the patient needs to stay at the hospital during part of their recovery, the visitor guidelines for inpatient rooms apply. Pre-op nurse will coordinate an appropriate time for 1 support person to accompany patient in pre-op.  This support person may not rotate.   Please refer to [email or web address] for the visitor guidelines for Inpatients (after your surgery is over and you are in a regular room).    Special instructions:    Oral Hygiene is also important to  reduce your risk of infection.  Remember - BRUSH YOUR TEETH THE MORNING OF SURGERY WITH YOUR REGULAR TOOTHPASTE   [place]- Preparing For Surgery  Before surgery, you can play an important role. Because skin is not sterile, your skin needs to be as free of germs as possible. You can reduce the number of germs on your skin by washing with CHG (chlorahexidine gluconate) Soap before surgery.  CHG is an antiseptic  cleaner which kills germs and bonds with the skin to continue killing germs even after washing.     Please do not use if you have an allergy to CHG or antibacterial soaps. If your skin becomes reddened/irritated stop using the CHG.  Do not shave (including legs and underarms) for at least 48 hours prior to first CHG shower. It is OK to shave your face.  Please follow these instructions carefully.     Shower the NIGHT BEFORE SURGERY and the MORNING OF SURGERY with CHG Soap.   If you chose to wash your hair, wash your hair first as usual with your normal shampoo. After you shampoo, rinse your hair and body thoroughly to remove the shampoo.  Then [employer] and genitals (private parts) with your normal soap and rinse thoroughly to remove soap.  After that Use CHG Soap as you would any other liquid soap. You can apply CHG directly to the skin and wash gently with a scrungie or a clean washcloth.   Apply the CHG Soap to your body ONLY FROM THE NECK DOWN.  Do not use on open wounds or open sores. Avoid contact with your eyes, ears, mouth and genitals (private parts). Wash Face and genitals (private parts)  with your normal soap.   Wash thoroughly, paying special attention to the area where your surgery will be performed.  Thoroughly rinse your body with warm water from the neck down.  DO NOT shower/wash with your normal soap after using and rinsing off the CHG Soap.  Pat yourself dry with a CLEAN TOWEL.  Wear CLEAN PAJAMAS to bed the night before surgery  Place CLEAN SHEETS on your bed the night before your surgery  DO NOT SLEEP WITH PETS.   Day of Surgery:  Take a shower with CHG soap. Wear Clean/Comfortable clothing the morning of surgery Do not apply any deodorants/lotions.   Remember to brush your teeth WITH YOUR REGULAR TOOTHPASTE.    If you received a COVID test during your pre-op visit, it is requested that you wear a mask when out in public, stay away from anyone that may not  be feeling well, and notify your surgeon if you develop symptoms. If you have been in contact with anyone that has tested positive in the last 10 days, please notify your surgeon.    Please read over the following fact sheets that you were given.

## 2022-07-16 ENCOUNTER — Other Ambulatory Visit: Payer: Self-pay

## 2022-07-16 ENCOUNTER — Other Ambulatory Visit: Payer: Self-pay | Admitting: Neurosurgery

## 2022-07-16 ENCOUNTER — Encounter (HOSPITAL_COMMUNITY)
Admission: RE | Admit: 2022-07-16 | Discharge: 2022-07-16 | Disposition: A | Discharge status: Home / Self Care | Payer: BC Managed Care – PPO | Source: Ambulatory Visit | Attending: Neurosurgery | Admitting: Neurosurgery

## 2022-07-16 ENCOUNTER — Encounter (HOSPITAL_COMMUNITY): Payer: Self-pay

## 2022-07-16 VITALS — BP 123/88 | HR 62 | Temp 98.2°F | Resp 18 | Ht 73.0 in | Wt 198.0 lb

## 2022-07-16 DIAGNOSIS — Z981 Arthrodesis status: Secondary | ICD-10-CM | POA: Diagnosis not present

## 2022-07-16 DIAGNOSIS — G96198 Other disorders of meninges, not elsewhere classified: Secondary | ICD-10-CM | POA: Diagnosis not present

## 2022-07-16 DIAGNOSIS — Z7982 Long term (current) use of aspirin: Secondary | ICD-10-CM | POA: Diagnosis not present

## 2022-07-16 DIAGNOSIS — Z01818 Encounter for other preprocedural examination: Secondary | ICD-10-CM

## 2022-07-16 DIAGNOSIS — I5022 Chronic systolic (congestive) heart failure: Secondary | ICD-10-CM | POA: Diagnosis not present

## 2022-07-16 DIAGNOSIS — M069 Rheumatoid arthritis, unspecified: Secondary | ICD-10-CM | POA: Diagnosis not present

## 2022-07-16 DIAGNOSIS — E7800 Pure hypercholesterolemia, unspecified: Secondary | ICD-10-CM | POA: Diagnosis not present

## 2022-07-16 DIAGNOSIS — G4733 Obstructive sleep apnea (adult) (pediatric): Secondary | ICD-10-CM | POA: Diagnosis not present

## 2022-07-16 DIAGNOSIS — Z01812 Encounter for preprocedural laboratory examination: Secondary | ICD-10-CM | POA: Insufficient documentation

## 2022-07-16 DIAGNOSIS — Z87891 Personal history of nicotine dependence: Secondary | ICD-10-CM | POA: Insufficient documentation

## 2022-07-16 DIAGNOSIS — Z79899 Other long term (current) drug therapy: Secondary | ICD-10-CM | POA: Diagnosis not present

## 2022-07-16 DIAGNOSIS — I2510 Atherosclerotic heart disease of native coronary artery without angina pectoris: Secondary | ICD-10-CM | POA: Diagnosis not present

## 2022-07-16 DIAGNOSIS — E785 Hyperlipidemia, unspecified: Secondary | ICD-10-CM | POA: Insufficient documentation

## 2022-07-16 DIAGNOSIS — I428 Other cardiomyopathies: Secondary | ICD-10-CM | POA: Diagnosis not present

## 2022-07-16 DIAGNOSIS — G4730 Sleep apnea, unspecified: Secondary | ICD-10-CM | POA: Diagnosis not present

## 2022-07-16 DIAGNOSIS — Z7952 Long term (current) use of systemic steroids: Secondary | ICD-10-CM | POA: Diagnosis not present

## 2022-07-16 DIAGNOSIS — Z96651 Presence of right artificial knee joint: Secondary | ICD-10-CM | POA: Diagnosis not present

## 2022-07-16 DIAGNOSIS — G9782 Other postprocedural complications and disorders of nervous system: Secondary | ICD-10-CM | POA: Diagnosis not present

## 2022-07-16 DIAGNOSIS — I252 Old myocardial infarction: Secondary | ICD-10-CM | POA: Diagnosis not present

## 2022-07-16 DIAGNOSIS — H3530 Unspecified macular degeneration: Secondary | ICD-10-CM | POA: Diagnosis not present

## 2022-07-16 HISTORY — DX: Unspecified macular degeneration: H35.30

## 2022-07-16 HISTORY — DX: Pure hypercholesterolemia, unspecified: E78.00

## 2022-07-16 LAB — CBC
HCT: 39.5 % (ref 39.0–52.0)
Hemoglobin: 12.5 g/dL — ABNORMAL LOW (ref 13.0–17.0)
MCH: 23.8 pg — ABNORMAL LOW (ref 26.0–34.0)
MCHC: 31.6 g/dL (ref 30.0–36.0)
MCV: 75.2 fL — ABNORMAL LOW (ref 80.0–100.0)
Platelets: 263 10*3/uL (ref 150–400)
RBC: 5.25 MIL/uL (ref 4.22–5.81)
RDW: 19.2 % — ABNORMAL HIGH (ref 11.5–15.5)
WBC: 11.1 10*3/uL — ABNORMAL HIGH (ref 4.0–10.5)
nRBC: 0 % (ref 0.0–0.2)

## 2022-07-16 LAB — BASIC METABOLIC PANEL
Anion gap: 9 (ref 5–15)
BUN: 14 mg/dL (ref 6–20)
CO2: 27 mmol/L (ref 22–32)
Calcium: 9.5 mg/dL (ref 8.9–10.3)
Chloride: 104 mmol/L (ref 98–111)
Creatinine, Ser: 0.95 mg/dL (ref 0.61–1.24)
GFR, Estimated: >60 mL/min (ref >60)
Glucose, Bld: 135 mg/dL — ABNORMAL HIGH (ref 70–99)
Potassium: 4 mmol/L (ref 3.5–5.1)
Sodium: 140 mmol/L (ref 135–145)

## 2022-07-16 LAB — SURGICAL PCR SCREEN
MRSA, PCR: NEGATIVE
Staphylococcus aureus: NEGATIVE

## 2022-07-16 NOTE — Progress Notes (Addendum)
PCP - Dr. Rochele Raring Cardiologist - Dr. Irish Lack Rheumatologist: Dr. Kathlene November  PPM/ICD - Denies  Chest x-ray - 04/27/2015 EKG - 02/24/2022 Stress Test - 04/09/2022 ECHO - 04/27/2015 Cardiac Cath - 05/01/2015  Sleep Study - Yes, diagnosed with sleep apnea CPAP - Does not wear one  Non-diabetic Last dose of GLP1 agonist-  n/a GLP1 instructions: n/a  Blood Thinner Instructions: Denies Aspirin Instructions: Last dose 07/11/2022  ERAS Protcol -Yes, clear liquid 3 hours prior to surgery PRE-SURGERY Ensure or G2- No  COVID TEST- Denies   Anesthesia review: Yes, Hx cardiomyopathy. Significant, progressively worsening difficulty walking and performing ADL's. Has macular degeneration, wife needs to be present to review information.   Patient denies shortness of breath, fever, cough and chest pain at PAT appointment   All instructions explained to the patient, with a verbal understanding of the material. Patient agrees to go over the instructions while at home for a better understanding. Patient also instructed to self quarantine after being tested for COVID-19. The opportunity to ask questions was provided.

## 2022-07-17 NOTE — Anesthesia Preprocedure Evaluation (Addendum)
Anesthesia Evaluation  Patient identified by MRN, date of birth, ID band Patient awake    Reviewed: Allergy & Precautions, Patient's Chart, lab work & pertinent test results, Unable to perform ROS - Chart review only  Airway Mallampati: II  TM Distance: >3 FB Neck ROM: Full    Dental no notable dental hx.    Pulmonary sleep apnea , former smoker   Pulmonary exam normal        Cardiovascular + CAD   Rhythm:Regular Rate:Normal  Stress MPS 04/2022   Findings are consistent with prior myocardial infarction. The study is intermediate risk.   No ST deviation was noted.   LV perfusion is abnormal. Defect 1: There is a small defect with mild reduction in uptake present in the mid inferolateral location(s) that is fixed. There is abnormal wall motion in the defect area. Consistent with infarction.   Left ventricular function is normal. Nuclear stress EF: 39 %. The left ventricular ejection fraction is moderately decreased (30-44%). End diastolic cavity size is moderately enlarged.   Prior study available for comparison from 04/26/2015.    Neuro/Psych  Headaches PSYCHIATRIC DISORDERS Anxiety Depression       GI/Hepatic negative GI ROS, Neg liver ROS,,,  Endo/Other  negative endocrine ROS    Renal/GU negative Renal ROS     Musculoskeletal  (+) Arthritis ,    Abdominal Normal abdominal exam  (+)   Peds  Hematology negative hematology ROS (+)   Anesthesia Other Findings   Reproductive/Obstetrics                             Anesthesia Physical Anesthesia Plan  ASA: 4  Anesthesia Plan: General   Post-op Pain Management:    Induction: Intravenous  PONV Risk Score and Plan: Ondansetron, Dexamethasone and Treatment may vary due to age or medical condition  Airway Management Planned:   Additional Equipment: ClearSight  Intra-op Plan:   Post-operative Plan: Extubation in OR  Informed  Consent: I have reviewed the patients History and Physical, chart, labs and discussed the procedure including the risks, benefits and alternatives for the proposed anesthesia with the patient or authorized representative who has indicated his/her understanding and acceptance.     Dental advisory given  Plan Discussed with: CRNA  Anesthesia Plan Comments: (PAT note by Karoline Caldwell, PA-C: History includesformer smoker (quit [id number], chronic systolic CHF, nonischemic cardiomyopathy(minimal CAD EF 35-40% 05/01/15,CAD (minimal CAD on 2016),HLD, brain AVM, OSA (intolerant to CPAP), right TKA (06/01/15).  Last visit with cardiologist Dr.Varanasiwas 02/24/2022. A preoperative stress test ordered in anticipation for brain surgery. Nuclear stress test on 04/09/22 with findings consistent with prior MI, EF 39%. No reversible ischemia per Dr. Irish Lack. He wrote, "No reversible ischemia. EF is stable from 2019 echo. Findings consistent with nonischemic cardiomyopathy. No further cardiac testing needed before AVM surgery. Avoid excessive IV fluids at the time of surgery to prevent volume overload."  Patient did subsequently undergo surgery on [id number] without complication.  Preop labs reviewed, mild anemia with hemoglobin 12.5, otherwise unremarkable.  EKG 02/24/22: SB at 50 bpm. LAFB. Non-specific ST/T wave abnormality.   Nuclear stress test 04/09/22:  Findings are consistent with prior myocardial infarction. The study is intermediate risk.  No ST deviation was noted.  LV perfusion is abnormal. Defect 1: There is a small defect with mild reduction in uptake present in the mid inferolateral location(s) that is fixed. There is abnormal wall motion in the defect area. Consistent  with infarction.  Left ventricular function is normal. Nuclear stress EF: 39 %. The left ventricular ejection fraction is moderately decreased (30-44%). End diastolic cavity size is moderately enlarged.   Prior study available for comparison from 04/26/2015. - Reviewed by Dr. Irish Lack, "No reversible ischemia. EF is stable from 2019 echo. Findings consistent with nonischemic cardiomyopathy. No further cardiac testing needed before AVM surgery. Avoid excessive IV fluids at the time of surgery to prevent volume overload."   Echo 06/22/17: Conclusions: 1. Moderate concentric LVH with moderate decrease in global wall motion. LV regional wall motion findings:. No wall motion abnormalities. Estimated EF 40%. 2. Left atrial cavity is mildly dilated.  3. Trileaflet aortic vale with a no regurgitation noted. Mildly aortic vale leaflets. Trace aortic valve stenosis. AV Pk Vel 1.94 m/s. AV Pk Grad 15.0 mmHg. AV Mean Grad 8.8 mmHg. AVA (Vmax) 1.36 cm2. 4. Trace mitral, tricuspid, and pulmonic regurgitation.  5. The aortic root is mildly dilated.  Cardiac cath 05/01/15: 1. Ost LAD to Dist LAD lesion, 15% stenosed. 2. There is mild to moderate left ventricular systolic dysfunction.  ? Left dominant coronary system. Minimal plaquing and calcification noted in the LAD. Nondominant right. ? Overall, widely patent coronary arteries. ? Mildly dilated and globally hypocontractile left ventricle, poorly visualized by hand injection. Ejection fraction is in the 35-40% range.   RECOMMENDATIONS:  ? Medical therapy for decreased LV function including removal of any potentially cardiotoxic therapies.   )        Anesthesia Quick Evaluation

## 2022-07-17 NOTE — Progress Notes (Signed)
Patient was called and informed that the surgery time for tomorrow was changed to 12:30 o'clock. Patient was instructed to be at the hospital at 10:30 o'clock and stop drinking clear liquids at 09:30 o'clock. Patient verbalized understanding.

## 2022-07-17 NOTE — Progress Notes (Signed)
Anesthesia Chart Review:  History includes former smoker (quit 05/24/08), RA, chronic systolic CHF, nonischemic cardiomyopathy (minimal CAD EF 35-40% 05/01/15, CAD (minimal CAD on 2016), HLD, brain AVM, OSA (intolerant to CPAP), right TKA (06/01/15).     Last visit with cardiologist Dr. Irish Lack was 02/24/2022.  A preoperative stress test ordered in anticipation for brain surgery. Nuclear stress test on 04/09/22 with findings consistent with prior MI, EF 39%. No reversible ischemia per Dr. Irish Lack. He wrote, "No reversible ischemia.  EF is stable from 2019 echo.  Findings consistent with nonischemic cardiomyopathy.  No further cardiac testing needed before AVM surgery.  Avoid excessive IV fluids at the time of surgery to prevent volume overload. "   Patient did subsequently undergo surgery on 99991111 without complication.  Preop labs reviewed, mild anemia with hemoglobin 12.5, otherwise unremarkable.   EKG 02/24/22: SB at 50 bpm. LAFB. Non-specific ST/T wave abnormality.    Nuclear stress test 04/09/22:   Findings are consistent with prior myocardial infarction. The study is intermediate risk.   No ST deviation was noted.   LV perfusion is abnormal. Defect 1: There is a small defect with mild reduction in uptake present in the mid inferolateral location(s) that is fixed. There is abnormal wall motion in the defect area. Consistent with infarction.   Left ventricular function is normal. Nuclear stress EF: 39 %. The left ventricular ejection fraction is moderately decreased (30-44%). End diastolic cavity size is moderately enlarged.   Prior study available for comparison from 04/26/2015. - Reviewed by Dr. Irish Lack, "No reversible ischemia.  EF is stable from 2019 echo.  Findings consistent with nonischemic cardiomyopathy.  No further cardiac testing needed before AVM surgery.  Avoid excessive IV fluids at the time of surgery to prevent volume overload."      Echo 06/22/17: Conclusions: Moderate  concentric LVH with moderate decrease in global wall motion. LV regional wall motion findings:. No wall motion abnormalities. Estimated EF 40%. Left atrial cavity is mildly dilated.  Trileaflet aortic vale with a no regurgitation noted. Mildly aortic vale leaflets. Trace aortic valve stenosis. AV Pk Vel 1.94 m/s. AV Pk Grad 15.0 mmHg. AV Mean Grad 8.8 mmHg. AVA (Vmax) 1.36 cm2. Trace mitral, tricuspid, and pulmonic regurgitation.  The aortic root is mildly dilated.    Cardiac cath 05/01/15: Ost LAD to Dist LAD lesion, 15% stenosed. There is mild to moderate left ventricular systolic dysfunction.   Left dominant coronary system. Minimal plaquing and calcification noted in the LAD. Nondominant right. Overall, widely patent coronary arteries. Mildly dilated and globally hypocontractile left ventricle, poorly visualized by hand injection. Ejection fraction is in the 35-40% range.     RECOMMENDATIONS:   Medical therapy for decreased LV function including removal of any potentially cardiotoxic therapies.    Wynonia Musty [facility] Short Stay Center/Anesthesiology Phone [phone] 07/17/2022 10:19 AM

## 2022-07-18 ENCOUNTER — Other Ambulatory Visit: Payer: Self-pay

## 2022-07-18 ENCOUNTER — Inpatient Hospital Stay (HOSPITAL_COMMUNITY): Payer: BC Managed Care – PPO

## 2022-07-18 ENCOUNTER — Inpatient Hospital Stay (HOSPITAL_COMMUNITY): Payer: BC Managed Care – PPO | Admitting: Certified Registered"

## 2022-07-18 ENCOUNTER — Encounter (HOSPITAL_COMMUNITY): Payer: Self-pay | Admitting: Neurosurgery

## 2022-07-18 ENCOUNTER — Inpatient Hospital Stay (HOSPITAL_COMMUNITY): Payer: BC Managed Care – PPO | Admitting: Physician Assistant

## 2022-07-18 ENCOUNTER — Inpatient Hospital Stay (HOSPITAL_COMMUNITY)
Admission: RE | Admit: 2022-07-18 | Discharge: 2022-07-25 | DRG: 026 | Disposition: A | Discharge status: Home / Self Care | Payer: BC Managed Care – PPO | Source: Ambulatory Visit | Attending: Neurosurgery | Admitting: Neurosurgery

## 2022-07-18 ENCOUNTER — Inpatient Hospital Stay (HOSPITAL_COMMUNITY)
Admission: RE | Disposition: A | Discharge status: Home / Self Care | Payer: Self-pay | Source: Ambulatory Visit | Attending: Neurosurgery

## 2022-07-18 DIAGNOSIS — M069 Rheumatoid arthritis, unspecified: Secondary | ICD-10-CM | POA: Diagnosis present

## 2022-07-18 DIAGNOSIS — I2510 Atherosclerotic heart disease of native coronary artery without angina pectoris: Secondary | ICD-10-CM | POA: Diagnosis present

## 2022-07-18 DIAGNOSIS — Z87891 Personal history of nicotine dependence: Secondary | ICD-10-CM

## 2022-07-18 DIAGNOSIS — G4733 Obstructive sleep apnea (adult) (pediatric): Secondary | ICD-10-CM | POA: Diagnosis present

## 2022-07-18 DIAGNOSIS — I428 Other cardiomyopathies: Secondary | ICD-10-CM | POA: Diagnosis present

## 2022-07-18 DIAGNOSIS — E7800 Pure hypercholesterolemia, unspecified: Secondary | ICD-10-CM | POA: Diagnosis present

## 2022-07-18 DIAGNOSIS — I5022 Chronic systolic (congestive) heart failure: Secondary | ICD-10-CM | POA: Diagnosis present

## 2022-07-18 DIAGNOSIS — G96198 Other disorders of meninges, not elsewhere classified: Secondary | ICD-10-CM | POA: Diagnosis present

## 2022-07-18 DIAGNOSIS — Z7982 Long term (current) use of aspirin: Secondary | ICD-10-CM

## 2022-07-18 DIAGNOSIS — Z981 Arthrodesis status: Secondary | ICD-10-CM | POA: Diagnosis not present

## 2022-07-18 DIAGNOSIS — I252 Old myocardial infarction: Secondary | ICD-10-CM | POA: Diagnosis not present

## 2022-07-18 DIAGNOSIS — Z79899 Other long term (current) drug therapy: Secondary | ICD-10-CM | POA: Diagnosis not present

## 2022-07-18 DIAGNOSIS — Z96651 Presence of right artificial knee joint: Secondary | ICD-10-CM | POA: Diagnosis present

## 2022-07-18 DIAGNOSIS — H3530 Unspecified macular degeneration: Secondary | ICD-10-CM | POA: Diagnosis present

## 2022-07-18 DIAGNOSIS — Z7952 Long term (current) use of systemic steroids: Secondary | ICD-10-CM | POA: Diagnosis not present

## 2022-07-18 DIAGNOSIS — G9782 Other postprocedural complications and disorders of nervous system: Secondary | ICD-10-CM | POA: Diagnosis present

## 2022-07-18 HISTORY — PX: LUMBAR LAMINECTOMY/DECOMPRESSION MICRODISCECTOMY: SHX5026

## 2022-07-18 HISTORY — PX: PLACEMENT OF LUMBAR DRAIN: SHX6028

## 2022-07-18 LAB — MRSA NEXT GEN BY PCR, NASAL: MRSA by PCR Next Gen: NOT DETECTED

## 2022-07-18 SURGERY — PLACEMENT OF LUMBAR DRAIN
Anesthesia: General

## 2022-07-18 MED ORDER — DEXAMETHASONE SODIUM PHOSPHATE 10 MG/ML IJ SOLN
INTRAMUSCULAR | Status: AC
  Filled 2022-07-18: qty 1

## 2022-07-18 MED ORDER — SPIRONOLACTONE 12.5 MG HALF TABLET
12.5000 mg | ORAL_TABLET | Freq: Every day | ORAL | Status: DC
  Administered 2022-07-19 – 2022-07-25 (×7): 12.5 mg via ORAL
  Filled 2022-07-18 (×8): qty 1

## 2022-07-18 MED ORDER — METOPROLOL SUCCINATE ER 25 MG PO TB24
25.0000 mg | ORAL_TABLET | Freq: Every day | ORAL | Status: DC
  Administered 2022-07-19 – 2022-07-25 (×7): 25 mg via ORAL
  Filled 2022-07-18 (×7): qty 1

## 2022-07-18 MED ORDER — CEFAZOLIN SODIUM-DEXTROSE 2-4 GM/100ML-% IV SOLN
2.0000 g | INTRAVENOUS | Status: AC
  Administered 2022-07-18: 2 g via INTRAVENOUS
  Filled 2022-07-18: qty 100

## 2022-07-18 MED ORDER — SERTRALINE HCL 50 MG PO TABS
50.0000 mg | ORAL_TABLET | Freq: Every morning | ORAL | Status: DC
  Administered 2022-07-19 – 2022-07-25 (×7): 50 mg via ORAL
  Filled 2022-07-18 (×7): qty 1

## 2022-07-18 MED ORDER — PHENYLEPHRINE HCL-NACL 20-0.9 MG/250ML-% IV SOLN
INTRAVENOUS | Status: DC | PRN
  Administered 2022-07-18: 25 ug/min via INTRAVENOUS

## 2022-07-18 MED ORDER — PROPOFOL 10 MG/ML IV BOLUS
INTRAVENOUS | Status: DC | PRN
  Administered 2022-07-18: 140 mg via INTRAVENOUS

## 2022-07-18 MED ORDER — MIDAZOLAM HCL 2 MG/2ML IJ SOLN
INTRAMUSCULAR | Status: DC | PRN
  Administered 2022-07-18: 2 mg via INTRAVENOUS

## 2022-07-18 MED ORDER — FENTANYL CITRATE (PF) 100 MCG/2ML IJ SOLN
25.0000 ug | INTRAMUSCULAR | Status: DC | PRN
  Administered 2022-07-18 (×2): 25 ug via INTRAVENOUS

## 2022-07-18 MED ORDER — LABETALOL HCL 5 MG/ML IV SOLN: 10.0000 mg | INTRAVENOUS | Status: DC | PRN

## 2022-07-18 MED ORDER — ONDANSETRON HCL 4 MG/2ML IJ SOLN
4.0000 mg | INTRAMUSCULAR | Status: DC | PRN
  Administered 2022-07-19: 4 mg via INTRAVENOUS
  Filled 2022-07-18: qty 2

## 2022-07-18 MED ORDER — THROMBIN 5000 UNITS EX SOLR
CUTANEOUS | Status: AC
  Filled 2022-07-18: qty 15000

## 2022-07-18 MED ORDER — THROMBIN (RECOMBINANT) 5000 UNITS EX SOLR
CUTANEOUS | Status: DC | PRN
  Administered 2022-07-18: 10 mL via TOPICAL

## 2022-07-18 MED ORDER — ORAL CARE MOUTH RINSE: 15.0000 mL | OROMUCOSAL | Status: DC | PRN

## 2022-07-18 MED ORDER — ADHERUS DURAL SEALANT
PACK | TOPICAL | Status: DC | PRN
  Administered 2022-07-18: 1 via TOPICAL

## 2022-07-18 MED ORDER — FENTANYL CITRATE (PF) 250 MCG/5ML IJ SOLN
INTRAMUSCULAR | Status: DC | PRN
  Administered 2022-07-18 (×2): 100 ug via INTRAVENOUS

## 2022-07-18 MED ORDER — CHLORHEXIDINE GLUCONATE CLOTH 2 % EX PADS: 6.0000 | MEDICATED_PAD | Freq: Once | CUTANEOUS | Status: DC

## 2022-07-18 MED ORDER — LIDOCAINE 2% (20 MG/ML) 5 ML SYRINGE
INTRAMUSCULAR | Status: DC | PRN
  Administered 2022-07-18: 60 mg via INTRAVENOUS

## 2022-07-18 MED ORDER — PROMETHAZINE HCL 12.5 MG PO TABS: 12.5000 mg | ORAL_TABLET | ORAL | Status: DC | PRN

## 2022-07-18 MED ORDER — LIDOCAINE 2% (20 MG/ML) 5 ML SYRINGE
INTRAMUSCULAR | Status: AC
  Filled 2022-07-18: qty 5

## 2022-07-18 MED ORDER — CHLORHEXIDINE GLUCONATE CLOTH 2 % EX PADS
6.0000 | MEDICATED_PAD | Freq: Every day | CUTANEOUS | Status: DC
  Administered 2022-07-19 – 2022-07-25 (×7): 6 via TOPICAL

## 2022-07-18 MED ORDER — ACETAMINOPHEN 10 MG/ML IV SOLN
1000.0000 mg | Freq: Once | INTRAVENOUS | Status: DC | PRN
  Administered 2022-07-18: 1000 mg via INTRAVENOUS

## 2022-07-18 MED ORDER — PANTOPRAZOLE SODIUM 40 MG IV SOLR
40.0000 mg | Freq: Every day | INTRAVENOUS | Status: DC
  Administered 2022-07-18 – 2022-07-20 (×2): 40 mg via INTRAVENOUS
  Filled 2022-07-18 (×2): qty 10

## 2022-07-18 MED ORDER — ACETAMINOPHEN 10 MG/ML IV SOLN
INTRAVENOUS | Status: AC
  Filled 2022-07-18: qty 100

## 2022-07-18 MED ORDER — ROCURONIUM BROMIDE 10 MG/ML (PF) SYRINGE
PREFILLED_SYRINGE | INTRAVENOUS | Status: DC | PRN
  Administered 2022-07-18: 60 mg via INTRAVENOUS
  Administered 2022-07-18: 10 mg via INTRAVENOUS

## 2022-07-18 MED ORDER — DIAZEPAM 5 MG PO TABS
10.0000 mg | ORAL_TABLET | Freq: Three times a day (TID) | ORAL | Status: DC | PRN
  Administered 2022-07-18 – 2022-07-21 (×3): 10 mg via ORAL
  Filled 2022-07-18 (×4): qty 2

## 2022-07-18 MED ORDER — LIDOCAINE-EPINEPHRINE 1 %-1:100000 IJ SOLN
INTRAMUSCULAR | Status: AC
  Filled 2022-07-18: qty 1

## 2022-07-18 MED ORDER — ONDANSETRON HCL 4 MG PO TABS: 4.0000 mg | ORAL_TABLET | ORAL | Status: DC | PRN

## 2022-07-18 MED ORDER — HYDROCODONE-ACETAMINOPHEN 5-325 MG PO TABS
1.0000 | ORAL_TABLET | ORAL | Status: DC | PRN
  Administered 2022-07-18: 1 via ORAL
  Administered 2022-07-18: 2 via ORAL
  Filled 2022-07-18: qty 1
  Filled 2022-07-18: qty 2

## 2022-07-18 MED ORDER — DEXAMETHASONE SODIUM PHOSPHATE 10 MG/ML IJ SOLN
INTRAMUSCULAR | Status: DC | PRN
  Administered 2022-07-18: 5 mg via INTRAVENOUS

## 2022-07-18 MED ORDER — LACTATED RINGERS IV SOLN: INTRAVENOUS | Status: DC

## 2022-07-18 MED ORDER — PHENYLEPHRINE 80 MCG/ML (10ML) SYRINGE FOR IV PUSH (FOR BLOOD PRESSURE SUPPORT)
PREFILLED_SYRINGE | INTRAVENOUS | Status: DC | PRN
  Administered 2022-07-18: 160 ug via INTRAVENOUS

## 2022-07-18 MED ORDER — CEFAZOLIN SODIUM-DEXTROSE 1-4 GM/50ML-% IV SOLN
1.0000 g | Freq: Three times a day (TID) | INTRAVENOUS | Status: AC
  Administered 2022-07-18: 1 g via INTRAVENOUS
  Filled 2022-07-18: qty 50

## 2022-07-18 MED ORDER — ORAL CARE MOUTH RINSE: 15.0000 mL | Freq: Once | OROMUCOSAL | Status: AC

## 2022-07-18 MED ORDER — SODIUM CHLORIDE 0.9 % IV SOLN: INTRAVENOUS | Status: DC

## 2022-07-18 MED ORDER — ROSUVASTATIN CALCIUM 20 MG PO TABS
20.0000 mg | ORAL_TABLET | Freq: Every day | ORAL | Status: DC
  Administered 2022-07-19 – 2022-07-25 (×7): 20 mg via ORAL
  Filled 2022-07-18 (×7): qty 1

## 2022-07-18 MED ORDER — GABAPENTIN 300 MG PO CAPS
300.0000 mg | ORAL_CAPSULE | Freq: Two times a day (BID) | ORAL | Status: DC
  Administered 2022-07-18 – 2022-07-25 (×14): 300 mg via ORAL
  Filled 2022-07-18 (×14): qty 1

## 2022-07-18 MED ORDER — 0.9 % SODIUM CHLORIDE (POUR BTL) OPTIME
TOPICAL | Status: DC | PRN
  Administered 2022-07-18: 1000 mL

## 2022-07-18 MED ORDER — PROPOFOL 10 MG/ML IV BOLUS
INTRAVENOUS | Status: AC
  Filled 2022-07-18: qty 20

## 2022-07-18 MED ORDER — ONDANSETRON HCL 4 MG/2ML IJ SOLN
INTRAMUSCULAR | Status: AC
  Filled 2022-07-18: qty 2

## 2022-07-18 MED ORDER — MIDAZOLAM HCL 2 MG/2ML IJ SOLN
INTRAMUSCULAR | Status: AC
  Filled 2022-07-18: qty 2

## 2022-07-18 MED ORDER — FENTANYL CITRATE (PF) 250 MCG/5ML IJ SOLN
INTRAMUSCULAR | Status: AC
  Filled 2022-07-18: qty 5

## 2022-07-18 MED ORDER — BACITRACIN ZINC 500 UNIT/GM EX OINT
TOPICAL_OINTMENT | CUTANEOUS | Status: AC
  Filled 2022-07-18: qty 28.35

## 2022-07-18 MED ORDER — FENTANYL CITRATE (PF) 100 MCG/2ML IJ SOLN
INTRAMUSCULAR | Status: AC
  Filled 2022-07-18: qty 2

## 2022-07-18 MED ORDER — FLUTICASONE PROPIONATE 50 MCG/ACT NA SUSP: 1.0000 | Freq: Every day | NASAL | Status: DC | PRN

## 2022-07-18 MED ORDER — ACETAMINOPHEN 650 MG RE SUPP: 650.0000 mg | RECTAL | Status: DC | PRN

## 2022-07-18 MED ORDER — ONDANSETRON HCL 4 MG/2ML IJ SOLN
INTRAMUSCULAR | Status: DC | PRN
  Administered 2022-07-18: 4 mg via INTRAVENOUS

## 2022-07-18 MED ORDER — ACETAMINOPHEN 325 MG PO TABS
650.0000 mg | ORAL_TABLET | ORAL | Status: DC | PRN
  Administered 2022-07-21 – 2022-07-23 (×4): 650 mg via ORAL
  Filled 2022-07-18 (×4): qty 2

## 2022-07-18 MED ORDER — SULFASALAZINE 500 MG PO TABS
1000.0000 mg | ORAL_TABLET | Freq: Two times a day (BID) | ORAL | Status: DC
  Administered 2022-07-18 – 2022-07-25 (×14): 1000 mg via ORAL
  Filled 2022-07-18 (×15): qty 2

## 2022-07-18 MED ORDER — HYDROCODONE-ACETAMINOPHEN 5-325 MG PO TABS
1.0000 | ORAL_TABLET | ORAL | Status: DC | PRN
  Administered 2022-07-18: 1 via ORAL
  Filled 2022-07-18: qty 1

## 2022-07-18 MED ORDER — CHLORHEXIDINE GLUCONATE 0.12 % MT SOLN
15.0000 mL | Freq: Once | OROMUCOSAL | Status: AC
  Administered 2022-07-18: 15 mL via OROMUCOSAL
  Filled 2022-07-18: qty 15

## 2022-07-18 MED ORDER — THROMBIN 5000 UNITS EX SOLR
OROMUCOSAL | Status: DC | PRN
  Administered 2022-07-18: 5 mL via TOPICAL

## 2022-07-18 MED ORDER — PREDNISONE 20 MG PO TABS: 20.0000 mg | ORAL_TABLET | Freq: Every day | ORAL | Status: DC | PRN

## 2022-07-18 MED ORDER — POLYVINYL ALCOHOL 1.4 % OP SOLN
1.0000 [drp] | Freq: Two times a day (BID) | OPHTHALMIC | Status: DC
  Administered 2022-07-18 – 2022-07-25 (×14): 1 [drp] via OPHTHALMIC
  Filled 2022-07-18 (×3): qty 15

## 2022-07-18 MED ORDER — ROCURONIUM BROMIDE 10 MG/ML (PF) SYRINGE
PREFILLED_SYRINGE | INTRAVENOUS | Status: AC
  Filled 2022-07-18: qty 10

## 2022-07-18 MED ORDER — SUGAMMADEX SODIUM 200 MG/2ML IV SOLN
INTRAVENOUS | Status: DC | PRN
  Administered 2022-07-18: 200 mg via INTRAVENOUS

## 2022-07-18 MED ORDER — BUPIVACAINE HCL (PF) 0.5 % IJ SOLN
INTRAMUSCULAR | Status: AC
  Filled 2022-07-18: qty 30

## 2022-07-18 SURGICAL SUPPLY — 78 items
ADH SKN CLS APL DERMABOND .7 (GAUZE/BANDAGES/DRESSINGS) ×1
APL SKNCLS STERI-STRIP NONHPOA (GAUZE/BANDAGES/DRESSINGS)
BAG COUNTER SPONGE SURGICOUNT (BAG) ×2 IMPLANT
BAG DRN CSF CATH SYS STRL MNTR (MISCELLANEOUS) ×1
BAG SPNG CNTER NS LX DISP (BAG) ×2
BAND INSRT 18 STRL LF DISP RB (MISCELLANEOUS) ×2
BAND RUBBER #18 3X1/16 STRL (MISCELLANEOUS) ×2 IMPLANT
BENZOIN TINCTURE PRP APPL 2/3 (GAUZE/BANDAGES/DRESSINGS) IMPLANT
BLADE CLIPPER SURG (BLADE) IMPLANT
BLADE SURG 11 STRL SS (BLADE) ×1 IMPLANT
BLADE SURG 15 STRL LF DISP TIS (BLADE) ×1 IMPLANT
BLADE SURG 15 STRL SS (BLADE) ×1
BUR MATCHSTICK NEURO 3.0 LAGG (BURR) ×1 IMPLANT
BUR PRECISION FLUTE 5.0 (BURR) IMPLANT
CANISTER SUCT 3000ML PPV (MISCELLANEOUS) ×1 IMPLANT
CATH LUMBAR HERMETIC 14G (CATHETERS) IMPLANT
CATHETER LUMBAR HERMETIC 14G (CATHETERS) ×1
DERMABOND ADVANCED .7 DNX12 (GAUZE/BANDAGES/DRESSINGS) ×2 IMPLANT
DRAPE C-ARM 42X72 X-RAY (DRAPES) ×1 IMPLANT
DRAPE HALF SHEET 40X57 (DRAPES) ×1 IMPLANT
DRAPE LAPAROTOMY 100X72X124 (DRAPES) ×2 IMPLANT
DRAPE MICROSCOPE SLANT 54X150 (MISCELLANEOUS) ×1 IMPLANT
DRAPE SURG 17X23 STRL (DRAPES) ×2 IMPLANT
DRSG OPSITE POSTOP 3X4 (GAUZE/BANDAGES/DRESSINGS) ×1 IMPLANT
DRSG OPSITE POSTOP 4X8 (GAUZE/BANDAGES/DRESSINGS) IMPLANT
DRSG TEGADERM 4X10 (GAUZE/BANDAGES/DRESSINGS) IMPLANT
DRSG TEGADERM 4X4.75 (GAUZE/BANDAGES/DRESSINGS) IMPLANT
DURAGUARD 04CMX04CM IMPLANT
DURAPREP 26ML APPLICATOR (WOUND CARE) ×2 IMPLANT
ELECT REM PT RETURN 9FT ADLT (ELECTROSURGICAL) ×1
ELECTRODE REM PT RTRN 9FT ADLT (ELECTROSURGICAL) ×1 IMPLANT
GAUZE 4X4 16PLY ~~LOC~~+RFID DBL (SPONGE) ×1 IMPLANT
GAUZE SPONGE 4X4 12PLY STRL (GAUZE/BANDAGES/DRESSINGS) IMPLANT
GLOVE BIO SURGEON STRL SZ7.5 (GLOVE) IMPLANT
GLOVE BIOGEL PI IND STRL 7.5 (GLOVE) ×4 IMPLANT
GLOVE ECLIPSE 7.0 STRL STRAW (GLOVE) ×2 IMPLANT
GLOVE EXAM NITRILE XL STR (GLOVE) IMPLANT
GOWN STRL REUS W/ TWL LRG LVL3 (GOWN DISPOSABLE) ×4 IMPLANT
GOWN STRL REUS W/ TWL XL LVL3 (GOWN DISPOSABLE) IMPLANT
GOWN STRL REUS W/TWL 2XL LVL3 (GOWN DISPOSABLE) IMPLANT
GOWN STRL REUS W/TWL LRG LVL3 (GOWN DISPOSABLE) ×4
GOWN STRL REUS W/TWL XL LVL3 (GOWN DISPOSABLE) ×2
GRAFT DURAGEN MATRIX 3WX3L (Graft) ×1 IMPLANT
GRAFT DURAGEN MATRIX 3X3 SNGL (Graft) IMPLANT
HEMOSTAT POWDER KIT SURGIFOAM (HEMOSTASIS) ×1 IMPLANT
KIT BASIN OR (CUSTOM PROCEDURE TRAY) ×2 IMPLANT
KIT TURNOVER KIT B (KITS) ×2 IMPLANT
NDL HYPO 18GX1.5 BLUNT FILL (NEEDLE) IMPLANT
NDL HYPO 25X1 1.5 SAFETY (NEEDLE) ×1 IMPLANT
NDL SPNL 18GX3.5 QUINCKE PK (NEEDLE) IMPLANT
NEEDLE HYPO 18GX1.5 BLUNT FILL (NEEDLE) IMPLANT
NEEDLE HYPO 22GX1.5 SAFETY (NEEDLE) ×1 IMPLANT
NEEDLE HYPO 25X1 1.5 SAFETY (NEEDLE) ×1 IMPLANT
NEEDLE SPNL 18GX3.5 QUINCKE PK (NEEDLE) IMPLANT
NS IRRIG 1000ML POUR BTL (IV SOLUTION) ×2 IMPLANT
PACK BASIC III (CUSTOM PROCEDURE TRAY) ×1
PACK LAMINECTOMY NEURO (CUSTOM PROCEDURE TRAY) ×1 IMPLANT
PACK SRG BSC III STRL LF ECLPS (CUSTOM PROCEDURE TRAY) ×1 IMPLANT
PAD ARMBOARD 7.5X6 YLW CONV (MISCELLANEOUS) ×6 IMPLANT
SOL ELECTROSURG ANTI STICK (MISCELLANEOUS) ×2
SOLUTION ELECTROSURG ANTI STCK (MISCELLANEOUS) ×2 IMPLANT
SPECIMEN JAR SMALL (MISCELLANEOUS) IMPLANT
SPIKE FLUID TRANSFER (MISCELLANEOUS) ×1 IMPLANT
SPONGE SURGIFOAM ABS GEL 12-7 (HEMOSTASIS) IMPLANT
SPONGE SURGIFOAM ABS GEL SZ50 (HEMOSTASIS) ×1 IMPLANT
SPONGE T-LAP 4X18 ~~LOC~~+RFID (SPONGE) IMPLANT
STAPLER SKIN PROX WIDE 3.9 (STAPLE) IMPLANT
STRIP CLOSURE SKIN 1/2X4 (GAUZE/BANDAGES/DRESSINGS) IMPLANT
SUT VIC AB 0 CT1 18XCR BRD8 (SUTURE) ×1 IMPLANT
SUT VIC AB 0 CT1 8-18 (SUTURE) ×2
SUT VIC AB 2-0 CT1 18 (SUTURE) IMPLANT
SUT VICRYL 3-0 RB1 18 ABS (SUTURE) ×3 IMPLANT
SYR 3ML LL SCALE MARK (SYRINGE) IMPLANT
SYR CONTROL 10ML LL (SYRINGE) ×2 IMPLANT
SYSTEM CSF EXTERNAL DRAINAGE (MISCELLANEOUS) IMPLANT
TOWEL GREEN STERILE (TOWEL DISPOSABLE) ×2 IMPLANT
TOWEL GREEN STERILE FF (TOWEL DISPOSABLE) ×2 IMPLANT
WATER STERILE IRR 1000ML POUR (IV SOLUTION) ×1 IMPLANT

## 2022-07-18 NOTE — Op Note (Signed)
  NEUROSURGERY OPERATIVE NOTE   PREOP DIAGNOSIS:  Posterior fossa pseudomeningocele   POSTOP DIAGNOSIS: Same  PROCEDURE: Placement of lumbar drain Repair of posterior fossa pseudomeningocele  SURGEON: Dr. Consuella Lose, MD  ASSISTANT: None  ANESTHESIA: General Endotracheal  EBL: Minimal  SPECIMENS: None  DRAINS: Lumbar drain  COMPLICATIONS: None immediate  CONDITION: Hemodynamically stable to PACU  HISTORY: Christopher Yu is a 62 y.o. male with a history of posterior fossa arteriovenous malformation who underwent posterior fossa craniectomy for resection approximately 3 months ago.  Patient has complained of generalized fatigue and gait instability with progressively enlarging pseudomeningocele at the operative site.  Although he did not have any significant improvement after high-volume lumbar puncture, we elected to proceed with placement of a lumbar drain and repair of the pseudomeningocele in hopes of improving his symptoms.  The risks, benefits, and alternatives to surgery were all reviewed in detail with the patient and his wife.  After all questions were answered informed consent was obtained and witnessed.   PROCEDURE IN DETAIL: The patient was brought to the operating room. After induction of general anesthesia, the patient was positioned on the operative table in the Mayfield head holder in the prone position. All pressure points were meticulously padded. Previous skin incision was then marked out and prepped and draped in the usual sterile fashion.  After timeout was conducted, the skin of the low back was prepped and draped.  Under AP fluoroscopy, the L4-5 interspace was identified and a Touhy needle was introduced.  Good clear flow of CSF was obtained.  A lumbar drain catheter was inserted to approximately 15 cm at the skin.  Position was confirmed with fluoroscopy.  Touhy needle was then removed and the drain was secured to the skin.  At this point,  the posterior fossa incision was opened sharply.  Immediately underneath this identified a large pseudomeningocele pocket with CSF.  This was suctioned out.  I then was easily able to identify the previous posterior fossa craniectomy.  I identified 2 areas of dehiscent dura, at the level of the foramen magnum just above the level of C1, and more laterally on the patient's left side.  The remainder of the dura appeared to be somewhat adherent to the cerebellum underneath.  It became clear that primary closure of the dura would not be possible.  I therefore elected to place an inlay graft of collagen.  This was then covered with a sheet of bovine pericardial onlay graft.  Subsequent to this, I cover the region with polyethylene glycol sealant.  There is no active CSF leakage noted.  The suboccipital musculature was then reapproximated in multiple layers using a combination of interrupted 0 Vicryl stitches.  Skin was closed with interrupted 3-0 Vicryl stitches and the skin was closed with Dermabond.  Sterile dressing was then applied.  At the end of the case all sponge, needle, and instrument counts were correct. The patient was then transferred to the stretcher and the Mayfield removed. He was then extubated, and taken to the post-anesthesia care unit in stable hemodynamic condition.   Consuella Lose, MD [facility]

## 2022-07-18 NOTE — Anesthesia Procedure Notes (Signed)
Procedure Name: Intubation Date/Time: 07/18/2022 11:47 AM  Performed by: Mosetta Pigeon, CRNAPre-anesthesia Checklist: Patient identified, Emergency Drugs available, Suction available and Patient being monitored Patient Re-evaluated:Patient Re-evaluated prior to induction Oxygen Delivery Method: Circle System Utilized Preoxygenation: Pre-oxygenation with 100% oxygen Induction Type: IV induction Ventilation: Mask ventilation without difficulty Laryngoscope Size: Glidescope and 3 Grade View: Grade I Tube type: Oral Tube size: 7.5 mm Number of attempts: 1 Airway Equipment and Method: Rigid stylet and Video-laryngoscopy Placement Confirmation: ETT inserted through vocal cords under direct vision, positive ETCO2 and breath sounds checked- equal and bilateral Secured at: 23 cm Tube secured with: Tape Dental Injury: Teeth and Oropharynx as per pre-operative assessment  Comments: Glidescope used because of limited mobility r/t pseudomenigiocele

## 2022-07-18 NOTE — Anesthesia Postprocedure Evaluation (Signed)
Anesthesia Post Note  Patient: Christopher Yu  Procedure(s) Performed: PLACEMENT OF LUMBAR DRAIN Repair of Pseudomeningiocele posterior     Patient location during evaluation: PACU Anesthesia Type: General Level of consciousness: awake and alert Pain management: pain level controlled Vital Signs Assessment: post-procedure vital signs reviewed and stable Respiratory status: spontaneous breathing, nonlabored ventilation, respiratory function stable and patient connected to nasal cannula oxygen Cardiovascular status: blood pressure returned to baseline and stable Postop Assessment: no apparent nausea or vomiting Anesthetic complications: no   No notable events documented.  Last Vitals:  Vitals:   07/18/22 1430 07/18/22 1500  BP: 137/86 130/83  Pulse: 79 63  Resp: 15 11  Temp: 36.8 C   SpO2: 99% 97%    Last Pain:  Vitals:   07/18/22 1333  TempSrc:   PainSc: 0-No pain                 Belenda Cruise P Everlyn Farabaugh

## 2022-07-18 NOTE — Transfer of Care (Signed)
Immediate Anesthesia Transfer of Care Note  Patient: Christopher Yu  Procedure(s) Performed: PLACEMENT OF LUMBAR DRAIN Repair of Pseudomeningiocele  Patient Location: PACU  Anesthesia Type:General  Level of Consciousness: awake, drowsy, and patient cooperative  Airway & Oxygen Therapy: Patient Spontanous Breathing and Patient connected to nasal cannula oxygen  Post-op Assessment: Report given to RN and Post -op Vital signs reviewed and stable  Post vital signs: Reviewed and stable  Last Vitals:  Vitals Value Taken Time  BP 120/86 07/18/22 1332  Temp    Pulse 83 07/18/22 1340  Resp 13 07/18/22 1340  SpO2 97 % 07/18/22 1340  Vitals shown include unvalidated device data.  Last Pain:  Vitals:   07/18/22 1020  TempSrc: Oral  PainSc: 4          Complications: No notable events documented.

## 2022-07-18 NOTE — Progress Notes (Addendum)
Patient  transferred from PACU to 4NICU.  Patient is alert and orient at this time.  Lumbar drain site assess, clean dry and intact.    Patient's wife notified and in room.  Patient's belonging at bedside (clothing only).    Per Dr. Kathyrn Sheriff: Lumbar drain 20 cc every 2 hours or 10 cc every hour.

## 2022-07-18 NOTE — H&P (Signed)
Chief Complaint   No chief complaint on file.   History of Present Illness  Christopher Yu is a 62 y.o. male with a history of cerebellar arteriovenous malformation who underwent posterior fossa craniotomy for resection of AVM approximately 3 months ago.  While the patient has done well from a neurologic standpoint, he continues to complain of generalized fatigue and lack of progress in ambulation.  Patient does have a enlarging pseudomeningocele at the operative site.  Although patient did undergo high-volume lumbar puncture without any significant improvement in symptoms we did elect to proceed with operative repair of the pseudomeningocele with placement of lumbar drain.  Past Medical History   Past Medical History:  Diagnosis Date   Anxiety    Arthritis, rheumatoid ([place])    dx 1988   CAD (coronary artery disease), native coronary artery    Mild LAD disease with calcification noted at cath 12//27/16    Cardiomyopathy ([place])    Cardiomyopathy ([place])    Chronic systolic heart failure ([place]) 05/01/2015   Depression    Deviated nasal septum 06/25/2011   Headache    High cholesterol    Hyperlipidemia    Impingement syndrome of left shoulder 12/22/2017   Macular degeneration    Rheumatoid aortitis    Sleep apnea    does not use cpap - UNABLE TO TOLERATE MASK   Sleep apnea in adult    deviated septum repaired, most recent sleep study was negative   Status post total right knee replacement 06/01/2015    Past Surgical History   Past Surgical History:  Procedure Laterality Date   ANKLE FUSION Right 05/05/2012   related to his arthritis   ANKLE FUSION Left 05/25/2013   related to his arthritis   ANKLE SURGERY Bilateral    APPLICATION OF CRANIAL NAVIGATION N/A 04/11/2022   Procedure: APPLICATION OF CRANIAL NAVIGATION;  Surgeon: Consuella Lose, MD;  Location: [facility];  Service: Neurosurgery;  Laterality: N/A;   CARDIAC CATHETERIZATION N/A 05/01/2015   Procedure:  Left Heart Cath and Coronary Angiography;  Surgeon: Belva Crome, MD;  Location: [facility];  Service: Cardiovascular;  Laterality: N/A;   CRANIOTOMY N/A 04/11/2022   Procedure: STEREOTACTIC SUBOCCIPITAL CRANIOTOMY FOR RESECTION OF ARTERIO-VENOUS MALFORMATION;  Surgeon: Consuella Lose, MD;  Location: [facility];  Service: Neurosurgery;  Laterality: N/A;   FRACTURE SURGERY     left femur fracture x 3   IR ANGIO EXTERNAL CAROTID SEL EXT CAROTID BILAT MOD SED  01/28/2022   IR ANGIO INTRA EXTRACRAN SEL INTERNAL CAROTID BILAT MOD SED  01/28/2022   IR ANGIO INTRA EXTRACRAN SEL INTERNAL CAROTID BILAT MOD SED  04/16/2022   IR ANGIO VERTEBRAL SEL VERTEBRAL BILAT MOD SED  01/28/2022   IR ANGIO VERTEBRAL SEL VERTEBRAL BILAT MOD SED  04/16/2022   KNEE ARTHROSCOPY     right x 4   NASAL SEPTOPLASTY W/ TURBINOPLASTY  06/25/2011   Procedure: NASAL SEPTOPLASTY WITH TURBINATE REDUCTION;  Surgeon: Jerrell Belfast, MD;  Location: [facility];  Service: ENT;  Laterality: Bilateral;   TONSILLECTOMY     TOTAL KNEE ARTHROPLASTY Right 06/01/2015   Procedure: RIGHT TOTAL KNEE ARTHROPLASTY;  Surgeon: Mcarthur Rossetti, MD;  Location: [facility];  Service: Orthopedics;  Laterality: Right;  Block+general    Social History   Social History   Tobacco Use   Smoking status: Former    Packs/day: 0.50    Years: 20.00    Additional pack years: 0.00    Total pack years: 10.00  Types: Cigarettes    Quit date: 05/24/2008    Years since quitting: 14.1   Smokeless tobacco: Never   Tobacco comments:    stopped smoking 8 yrs ago.  Vaping Use   Vaping Use: Never used  Substance Use Topics   Alcohol use: Yes    Comment: rarely   Drug use: No    Medications   Prior to Admission medications   Medication Sig Start Date End Date Taking? Authorizing Provider  aspirin 81 MG tablet Take 81 mg by mouth daily.   Yes [provider]  CALCIUM PO Take 1 tablet by mouth daily.   Yes [provider]   Cholecalciferol (VITAMIN D3) 50 MCG (2000 UT) TABS Take 2,000 Units by mouth daily.   Yes [provider]  clindamycin (CLEOCIN) 150 MG capsule Take 600 mg by mouth See admin instructions. Take 600mg  by mouth an hour prior to dental appointment.   Yes [provider]  diazepam (VALIUM) 10 MG tablet Take 1 tablet (10 mg total) by mouth every 8 (eight) hours as needed for muscle spasms. 04/17/22  Yes Consuella Lose, MD  fluticasone (FLONASE) 50 MCG/ACT nasal spray Place 1 spray into both nostrils daily as needed for allergies or rhinitis.   Yes [provider]  gabapentin (NEURONTIN) 300 MG capsule Take 300 mg by mouth 2 (two) times daily. 11/07/21  Yes [provider]  methotrexate (RHEUMATREX) 2.5 MG tablet Take 20 mg by mouth once a week. Takes 8 tablets(20mg ) every Saturday.     Caution:Chemotherapy. Protect from light.   Yes [provider]  metoprolol succinate (TOPROL-XL) 25 MG 24 hr tablet TAKE 1 TABLET DAILY 07/02/22  Yes Jettie Booze, MD  Multiple Vitamins-Minerals (PRESERVISION AREDS 2 PO) Take 2 capsules by mouth daily.   Yes [provider]  predniSONE (DELTASONE) 5 MG tablet Take 20 mg by mouth daily as needed (Arthritis). 08/08/19  Yes [provider]  Propylene Glycol (SYSTANE COMPLETE) 0.6 % SOLN Place 1 drop into both eyes 2 (two) times daily.   Yes [provider]  rosuvastatin (CRESTOR) 20 MG tablet Take 1 tablet (20 mg total) by mouth daily. 02/24/22  Yes Jettie Booze, MD  sertraline (ZOLOFT) 50 MG tablet Take 50 mg by mouth every morning.   Yes [provider]  spironolactone (ALDACTONE) 25 MG tablet TAKE 1 TABLET DAILY Patient taking differently: Take 12.5 mg by mouth daily. 07/02/22  Yes Jettie Booze, MD  sulfaSALAzine (AZULFIDINE) 500 MG tablet Take 1,000 mg by mouth 2 (two) times daily.    Yes [provider]  traMADol (ULTRAM) 50 MG tablet Take 50 mg by mouth every  6 (six) hours as needed for moderate pain or severe pain. 12/05/16  Yes [provider]  lisinopril (ZESTRIL) 10 MG tablet TAKE 1 TABLET DAILY Patient not taking: Reported on 05/09/2022 04/03/22   Jettie Booze, MD  RITUXAN 500 MG/50ML injection See admin instructions. Every 6 months 2 dose infusion Ruxience Patient not taking: Reported on 04/22/2022 06/14/19   [provider]    Allergies  No Known Allergies  Review of Systems  ROS  Neurologic Exam  Awake, alert, oriented Memory and concentration grossly intact Speech fluent, appropriate CN grossly intact Motor exam: Upper Extremities Deltoid Bicep Tricep Grip  Right 5/5 5/5 5/5 5/5  Left 5/5 5/5 5/5 5/5   Lower Extremities IP Quad PF DF EHL  Right 5/5 5/5 5/5 5/5 5/5  Left 5/5 5/5 5/5  5/5 5/5   Sensation grossly intact to LT  Impression  - 62 y.o. male 6-month status post posterior fossa craniotomy for resection of arteriovenous malformation with enlarging pseudomeningocele  Plan  -Proceed with placement of lumbar drain and direct repair of the posterior fossa pseudomeningocele  I have reviewed the indications for the procedure as well as the details of the procedure and the expected postoperative course and recovery at length with the patient and his wife in the office. We have also reviewed in detail the risks, benefits, and alternatives to the procedure. All questions were answered and Gardner Candle provided informed consent to proceed.  Consuella Lose, MD [facility]

## 2022-07-19 MED ORDER — OXYCODONE HCL 5 MG PO TABS
5.0000 mg | ORAL_TABLET | ORAL | Status: DC | PRN
  Administered 2022-07-19 – 2022-07-22 (×19): 5 mg via ORAL
  Filled 2022-07-19 (×19): qty 1

## 2022-07-19 MED ORDER — DIAZEPAM 5 MG PO TABS
5.0000 mg | ORAL_TABLET | ORAL | Status: DC
  Administered 2022-07-19 – 2022-07-25 (×32): 5 mg via ORAL
  Filled 2022-07-19 (×33): qty 1

## 2022-07-19 NOTE — Progress Notes (Signed)
Patient neuro status stable. Alert and oriented x 4. Lumbar drain draining 10 cc /hr and then clamp. HOB maintained at 30 degrees. Honeycomb dressing to the back of the neck C/D/I. V/S stable. Patient voiding. No N/V. Tolerating PO.   The main issue so far overnight is pain control being inadequate. The patient c/o 8/10 to the neck and back of the head where surgery was performed earlier today consistently despite given 5mg  hydrocodone during the day, escalated to 10mg  at 8pm, changed to 5mg  oxycodone administered at 12am. The patient also got his scheduled 300mg  gabapentin and prn 10mg  Valium.   At 1am he remains wide awake in bed unable to get comfortable.   The patient has not called me into the room to report being in pain or needing medication, he does not mention it until pain assessment and reassessment is done per unit protocol. He has been open to trying other interventions like ice pack and even asked for a refill on this.   I checked his PTA meds and he was prescribed Tramadol 50mg  q 6 hours PRN as well as non-narcotic meds for his rheumatoid Arthritis. I checked PMP aware database for [place] and the patient is not on chronic narcotics. He was prescribed hydrocodone 5mg  in short supply by Dr. Kathyrn Sheriff after d/c from prior surgery in December and also recently leading up to this surgery.   The on call provider contacted twice concerning unchanged pain of 8/10 intensity and difficulty sleeping. The patient was quickly responsive and attentive to the concerns presented however The overall position is that the pain will get better gradually, we cannot take his pain away, and we cannot risk impairing his neuro status.   The patient is encouraged to address it further with the provider team and upper management tomorrow if he chooses.

## 2022-07-19 NOTE — Progress Notes (Signed)
  NEUROSURGERY PROGRESS NOTE   No issues overnight. Pt cont to report significant neck pain/soreness 8/10 severity. Appears comfortable currently.  EXAM:  BP 136/71   Pulse (!) 58   Temp 98 F (36.7 C) (Oral)   Resp 13   Ht 6\' 1"  (1.854 m)   Wt 90.3 kg   SpO2 93%   BMI 26.25 kg/m   Awake, alert, oriented  Speech fluent, appropriate  CN grossly intact  5/5 BUE/BLE  Wound c/d/I, no leak, no recurrence of pseudomeningocele LD in place, draining adequately  IMPRESSION:  62 y.o. male POD#1 s/p placement of lumbar drain, repair of pos fossa pseudomeningocele, doing wel  PLAN: - valium scheduled 5mg  q4hrs, cont oxycodone q4 hrs - Cont lumbar drainage 20cc q2 hrs, plan on at least another 3-4 days - Mobilize today   Consuella Lose, MD [facility]

## 2022-07-20 MED ORDER — PANTOPRAZOLE SODIUM 40 MG PO TBEC
40.0000 mg | DELAYED_RELEASE_TABLET | Freq: Every day | ORAL | Status: DC
  Administered 2022-07-20 – 2022-07-24 (×5): 40 mg via ORAL
  Filled 2022-07-20 (×6): qty 1

## 2022-07-20 NOTE — Progress Notes (Addendum)
NEUROSURGERY PROGRESS NOTE  Doing well. No acute events overnight. Complains of appropriate headaches Posterior fossa Incision is a little swollen but no drainage.  Lumbar drain in place  Temp:  [98.6 F (37 C)-99.9 F (37.7 C)] 99.9 F (37.7 C) (03/17 0300) Pulse Rate:  [58-109] 80 (03/17 0800) Resp:  [12-21] 16 (03/17 0800) BP: (106-166)/(56-95) 114/62 (03/17 0800) SpO2:  [87 %-97 %] 94 % (03/17 0800)  Plan: Continue lumbar drain with a goal of 20cc Q2 hrs.   Eleonore Chiquito, NP 07/20/2022 8:30 AM

## 2022-07-21 ENCOUNTER — Encounter (HOSPITAL_COMMUNITY): Payer: Self-pay | Admitting: Neurosurgery

## 2022-07-21 MED FILL — Thrombin For Soln 5000 Unit: CUTANEOUS | Qty: 2 | Status: AC

## 2022-07-21 NOTE — Progress Notes (Signed)
  NEUROSURGERY PROGRESS NOTE   No issues overnight. Pt cont to report significant neck pain/soreness. Appears comfortable currently, eating breakfast.  EXAM:  BP 118/71   Pulse (!) 104   Temp 98.6 F (37 C) (Oral)   Resp 16   Ht 6\' 1"  (1.854 m)   Wt 90.3 kg   SpO2 92%   BMI 26.25 kg/m   Awake, alert, oriented  Speech fluent, appropriate  CN grossly intact  5/5 BUE/BLE  Wound c/d/I, no leak, no recurrence of pseudomeningocele LD in place, draining adequately  IMPRESSION:  62 y.o. male POD#3 s/p placement of lumbar drain, repair of pos fossa pseudomeningocele, doing well  PLAN: - cont current pain regimen - Cont lumbar drainage 20cc q2 hrs, plan on at least another few days - Mobilize today with PT/OT   Consuella Lose, MD [facility]

## 2022-07-21 NOTE — Evaluation (Signed)
Physical Therapy Evaluation Patient Details Name: Christopher Yu MRN: [id number] DOB: 1960-06-27 Today's Date: 07/21/2022  History of Present Illness  62 yo male presenting 3/15 for surgical repair of  pseudomeningocele now after having craniotomy for resection of AVM approximately 3 months ago.  He continues to complain of generalized fatigue and lack of progress in ambulation. PMH: for CAD, R TKA, cardiomyopathy, chronic systolic heart failure, macular degeneration.   Clinical Impression  Pt in bed upon arrival of PT, agreeable to evaluation at this time. Prior to admission the pt was ambulating with use of RW, mostly independent, but wife does report x2 falls at home since last d/c. The pt now presents with limitations in functional mobility, power, endurance, and dynamic stability due to above dx and resulting pain, and will continue to benefit from skilled PT to address these deficits. The pt was able to complete sit-stand transfers and short bout of hallway ambulation with minG and use of RW. He had no overt LOB and VSS, but was limited by headache with activity. Will continue to benefit from skilled PT acutely to progress endurance and further challenge dynamic stability to allow pt to return to full independence.         Recommendations for follow up therapy are one component of a multi-disciplinary discharge planning process, led by the attending physician.  Recommendations may be updated based on patient status, additional functional criteria and insurance authorization.  Follow Up Recommendations Outpatient PT      Assistance Recommended at Discharge Intermittent Supervision/Assistance  Patient can return home with the following  A little help with walking and/or transfers;A little help with bathing/dressing/bathroom;Assistance with cooking/housework;Help with stairs or ramp for entrance    Equipment Recommendations None recommended by PT  Recommendations for Other  Services       Functional Status Assessment Patient has had a recent decline in their functional status and demonstrates the ability to make significant improvements in function in a reasonable and predictable amount of time.     Precautions / Restrictions Precautions Precautions: Fall Precaution Comments: lumbar drain Restrictions Weight Bearing Restrictions: No      Mobility  Bed Mobility Overal bed mobility: Needs Assistance             General bed mobility comments: pt OOB in recliner at start and end of session    Transfers Overall transfer level: Needs assistance Equipment used: Rolling walker (2 wheels) Transfers: Sit to/from Stand Sit to Stand: Min guard           General transfer comment: Min guard and use of the RW for sit to stand    Ambulation/Gait Ambulation/Gait assistance: Min guard, +2 safety/equipment Gait Distance (Feet): 60 Feet Assistive device: Rolling walker (2 wheels) Gait Pattern/deviations: Step-through pattern, Decreased stride length Gait velocity: decreased Gait velocity interpretation: <1.31 ft/sec, indicative of household ambulator   General Gait Details: slow and guarded but no overt LOB or need for assist. limited cervical movement due to pain. VSS.  Stairs            Wheelchair Mobility    Modified Rankin (Stroke Patients Only)       Balance Overall balance assessment: Needs assistance, History of Falls Sitting-balance support: No upper extremity supported Sitting balance-Leahy Scale: Fair     Standing balance support: During functional activity, Reliant on assistive device for balance Standing balance-Leahy Scale: Poor Standing balance comment: dependent on BUE support  Pertinent Vitals/Pain Pain Assessment Pain Assessment: Faces Faces Pain Scale: Hurts even more Pain Location: headache Pain Descriptors / Indicators: Aching, Discomfort Pain Intervention(s): Limited  activity within patient's tolerance, Monitored during session, Repositioned, Patient requesting pain meds-RN notified    Home Living Family/patient expects to be discharged to:: Private residence Living Arrangements: Spouse/significant other;Children (two daughters age 14 and 22) Available Help at Discharge: Available PRN/intermittently (spouse works 7-12:30) Type of Home: House Home Access: Stairs to enter Entrance Stairs-Rails: Right Entrance Stairs-Number of Steps: 3 Alternate Level Stairs-Number of Steps: flight Home Layout: Two level;Able to live on main level with bedroom/bathroom Home Equipment: Rolling Walker (2 wheels);Shower seat;Cane - single point      Prior Function Prior Level of Function : Needs assist             Mobility Comments: Uses walker has had some falls since original surgery but prior, did not need assistance. ADLs Comments: Spouse has been supervising recently with ADLs     Hand Dominance   Dominant Hand: Right    Extremity/Trunk Assessment   Upper Extremity Assessment Upper Extremity Assessment: Defer to OT evaluation    Lower Extremity Assessment Lower Extremity Assessment: Overall WFL for tasks assessed    Cervical / Trunk Assessment Cervical / Trunk Assessment: Other exceptions Cervical / Trunk Exceptions: decreased cervical movements throughout secondary to pain and stiffness.  Communication   Communication: No difficulties  Cognition Arousal/Alertness: Awake/alert Behavior During Therapy: WFL for tasks assessed/performed Overall Cognitive Status: Within Functional Limits for tasks assessed                                          General Comments General comments (skin integrity, edema, etc.): VSS on RA    Exercises     Assessment/Plan    PT Assessment Patient needs continued PT services  PT Problem List Decreased strength;Decreased activity tolerance;Decreased balance       PT Treatment Interventions  Gait training;DME instruction;Stair training;Functional mobility training;Therapeutic activities;Therapeutic exercise;Balance training;Patient/family education    PT Goals (Current goals can be found in the Care Plan section)  Acute Rehab PT Goals Patient Stated Goal: return home, not fall PT Goal Formulation: With patient Time For Goal Achievement: 08/04/22 Potential to Achieve Goals: Good    Frequency Min 3X/week     Co-evaluation               AM-PAC PT "6 Clicks" Mobility  Outcome Measure Help needed turning from your back to your side while in a flat bed without using bedrails?: A Little Help needed moving from lying on your back to sitting on the side of a flat bed without using bedrails?: A Little Help needed moving to and from a bed to a chair (including a wheelchair)?: A Little Help needed standing up from a chair using your arms (e.g., wheelchair or bedside chair)?: A Little Help needed to walk in hospital room?: A Little Help needed climbing 3-5 steps with a railing? : A Little 6 Click Score: 18    End of Session Equipment Utilized During Treatment: Gait belt Activity Tolerance: Patient tolerated treatment well Patient left: in chair;with call bell/phone within reach;with chair alarm set;with nursing/sitter in room;with family/visitor present Nurse Communication: Mobility status;Patient requests pain meds PT Visit Diagnosis: Unsteadiness on feet (R26.81);History of falling (Z91.81)    Time: [id number] PT Time Calculation (min) (ACUTE ONLY): 24 min  Charges:   PT Evaluation $PT Eval Low Complexity: 1 Low PT Treatments $Gait Training: 8-22 mins        West Carbo, PT, DPT   Acute Rehabilitation Department Office [phone] Secure Chat Communication Preferred   Sandra Cockayne 07/21/2022, 3:03 PM

## 2022-07-21 NOTE — Evaluation (Signed)
Occupational Therapy Evaluation Patient Details Name: Christopher Yu MRN: [id number] DOB: [birth year] Today's Date: 07/21/2022   History of Present Illness Christopher Yu is a 62 y.o. male admitted for enlarging pseudomeningocele now after having craniotomy for resection of AVM approximately 3 months ago.  He continues to complain of generalized fatigue and lack of progress in ambulation. Procedure completed by Dr Kathyrn Sheriff.  PMH: for CAD, R TKA, cardiomyopathy, chronic systolic heart failure, macular degeneration.   Clinical Impression   Pt currently at min guard assist for simulated selfcare and toilet transfers with use of the RW.  Increased posterior head/neck pain limited participation.  Feel overall he continues to have balance deficits that will need to be addressed in order to return home and be at a safe level for completion of selfcare tasks.  Recommend acute care OT at this time to continue working on balance and endurance with impact on ADL performance.  He will have PRN supervision at home from his spouse.       Recommendations for follow up therapy are one component of a multi-disciplinary discharge planning process, led by the attending physician.  Recommendations may be updated based on patient status, additional functional criteria and insurance authorization.   Follow Up Recommendations  No OT follow up     Assistance Recommended at Discharge PRN  Patient can return home with the following Assistance with cooking/housework;A little help with bathing/dressing/bathroom;Help with stairs or ramp for entrance;Assist for transportation    Functional Status Assessment  Patient has had a recent decline in their functional status and demonstrates the ability to make significant improvements in function in a reasonable and predictable amount of time.  Equipment Recommendations  None recommended by OT    Recommendations for Other Services        Precautions / Restrictions Precautions Precautions: Fall Precaution Comments: lumbar drain Restrictions Weight Bearing Restrictions: No      Mobility Bed Mobility Overal bed mobility: Needs Assistance Bed Mobility: Supine to Sit     Supine to sit: Supervision, HOB elevated          Transfers Overall transfer level: Needs assistance Equipment used: Rolling walker (2 wheels) Transfers: Sit to/from Stand Sit to Stand: Min guard           General transfer comment: Min guard and use of the RW for sit to stand and transfer to the bedside chair.      Balance Overall balance assessment: Needs assistance Sitting-balance support: No upper extremity supported Sitting balance-Leahy Scale: Fair     Standing balance support: During functional activity, Reliant on assistive device for balance Standing balance-Leahy Scale: Poor                             ADL either performed or assessed with clinical judgement   ADL Overall ADL's : Needs assistance/impaired Eating/Feeding: Independent;Sitting   Grooming: Oral care;Bed level;Set up   Upper Body Bathing: Supervision/ safety;Sitting Upper Body Bathing Details (indicate cue type and reason): simulated Lower Body Bathing: Min guard;Sit to/from stand Lower Body Bathing Details (indicate cue type and reason): simulated Upper Body Dressing : Set up;Sitting Upper Body Dressing Details (indicate cue type and reason): simulated Lower Body Dressing: Min guard;Sit to/from stand Lower Body Dressing Details (indicate cue type and reason): simulated Toilet Transfer: Ambulation;Stand-pivot;Min guard;Rolling walker (2 wheels) Toilet Transfer Details (indicate cue type and reason): simulated to recliner, pt denied need to toilet Toileting- Clothing Manipulation and  Hygiene: Sit to/from stand;Min guard       Functional mobility during ADLs: Min guard;Rolling walker (2 wheels) General ADL Comments: Pt currently min guard for  transfers with use of the RW for support.  Increased posterior cervical pain limits ability to participate.  BP in sitting 101/74 initially with HR in the low 100s.  After standing and transferring to recliner BP at 116/73.  Oxygen sats at 91-92% on room air.     Vision Baseline Vision/History: 1 Wears glasses;6 Macular Degeneration Ability to See in Adequate Light: 0 Adequate Patient Visual Report: Other (comment) (Pt reports having diplopia previously but now resolved.  Vision overall not back to Christopher Yu or baseline but getting closer.) Vision Assessment?: Vision impaired- to be further tested in functional context     Perception  [facility]   Praxis  [employer]    Pertinent Vitals/Pain Pain Assessment Pain Assessment: Faces Pain Score: 5  Faces Pain Scale: Hurts a little bit Pain Location: back of the neck and upper lower back Pain Descriptors / Indicators: Aching, Discomfort Pain Intervention(s): Limited activity within patient's tolerance, Repositioned     Hand Dominance  right   Extremity/Trunk Assessment Upper Extremity Assessment Upper Extremity Assessment: Overall WFL for tasks assessed   Lower Extremity Assessment Lower Extremity Assessment: Defer to PT evaluation   Cervical / Trunk Assessment Cervical / Trunk Assessment: Other exceptions Cervical / Trunk Exceptions: decreased cervical movements throughout secondary to pain and stiffness.   Communication Communication Communication: No difficulties   Cognition Arousal/Alertness: Awake/alert Behavior During Therapy: WFL for tasks assessed/performed Overall Cognitive Status: Within Functional Limits for tasks assessed                                       General Comments       Exercises     Shoulder Instructions      Home Living Family/patient expects to be discharged to:: Private residence Living Arrangements: Spouse/significant other;Children (two daughers 16 and 6) Available Help at Discharge:  Available PRN/intermittently (spouse works right now 7-12:30) Type of Home: House Home Access: Stairs to enter [employer] of Steps: [place]: Two level;Able to live on main level with bedroom/bathroom     Bathroom Shower/Tub: Occupational psychologist: Handicapped height Bathroom Accessibility: Yes   Home Equipment: Conservation officer, nature (2 wheels);Shower seat;Cane - single point          Prior Functioning/Environment Prior Level of Function : Needs assist             Mobility Comments: Uses walker has had some falls since original surgery but prior, did not need assistance. ADLs Comments: Spouse has been supervising recently with ADLs        OT Problem List: Decreased range of motion;Impaired balance (sitting and/or standing);Pain;Decreased strength;Decreased coordination      OT Treatment/Interventions: Self-care/ADL training;Patient/family education;Balance training;Therapeutic activities;DME and/or AE instruction    OT Goals(Current goals can be found in the care plan section) Acute Rehab OT Goals Patient Stated Goal: Pt wants to get back to playing golf OT Goal Formulation: With patient/family Time For Goal Achievement: 08/04/22 Potential to Achieve Goals: Good  OT Frequency: Min 2X/week       AM-PAC OT "6 Clicks" Daily Activity     Outcome Measure Help from another person eating meals?: None Help from another person taking care of personal grooming?: A Little Help from  another person toileting, which includes using toliet, bedpan, or urinal?: A Little Help from another person bathing (including washing, rinsing, drying)?: A Little Help from another person to put on and taking off regular upper body clothing?: A Little Help from another person to put on and taking off regular lower body clothing?: A Little 6 Click Score: 19   End of Session Equipment Utilized During Treatment: Gait belt;Rolling walker (2 wheels) Nurse Communication:  Mobility status  Activity Tolerance: Patient limited by pain Patient left: in chair;with chair alarm set;with family/visitor present  OT Visit Diagnosis: Unsteadiness on feet (R26.81);Other abnormalities of gait and mobility (R26.89);Repeated falls (R29.6);Muscle weakness (generalized) (M62.81);Pain Pain - Right/Left:  (posterior neck)                Time: [id number] OT Time Calculation (min): 39 min Charges:  OT General Charges $OT Visit: 1 Visit OT Evaluation $OT Eval Moderate Complexity: 1 Mod OT Treatments $Self Care/Home Management : 23-37 mins Clyda Greener, OTR/L [place]  Office [phone] 07/21/2022

## 2022-07-22 MED ORDER — HYDROCODONE-ACETAMINOPHEN 7.5-325 MG PO TABS
1.0000 | ORAL_TABLET | Freq: Four times a day (QID) | ORAL | Status: DC | PRN
  Administered 2022-07-22 – 2022-07-25 (×9): 1 via ORAL
  Filled 2022-07-22 (×10): qty 1

## 2022-07-22 NOTE — Progress Notes (Signed)
  NEUROSURGERY PROGRESS NOTE   No issues overnight.   EXAM:  BP 106/75   Pulse 79   Temp 98.4 F (36.9 C) (Oral)   Resp 15   Ht 6\' 1"  (1.854 m)   Wt 90.3 kg   SpO2 93%   BMI 26.25 kg/m   Awake, alert, oriented  Speech fluent, appropriate  CN grossly intact  5/5 BUE/BLE  Wound c/d/I, no leak, no recurrence of pseudomeningocele LD in place, draining adequately  IMPRESSION:  62 y.o. male POD#4 s/p placement of lumbar drain, repair of pos fossa pseudomeningocele, doing well  PLAN: - cont current pain regimen - Cont lumbar drainage 20cc q2 hrs, plan on at least another day - Cont to mobilize   Consuella Lose, MD [facility]

## 2022-07-22 NOTE — Progress Notes (Signed)
Physical Therapy Treatment Patient Details Name: Christopher Yu MRN: [id number] DOB: April 06, 1961 Today's Date: 07/22/2022   History of Present Illness 62 yo male presenting 3/15 for surgical repair of  pseudomeningocele now after having craniotomy for resection of AVM approximately 3 months ago.  He continues to complain of generalized fatigue and lack of progress in ambulation. +lumbar drain PMH: for CAD, R TKA, cardiomyopathy, chronic systolic heart failure, macular degeneration.    PT Comments    Patient initially not wanting to work with PT, however when RN agreed he could go back to bed after working with PT he was agreeable. Patient feeling better but still with "throbbing" headache while up walking. Tolerated incr distance (250 ft) with RW and minguard assist (after RN clamped lumbar drain and detached IV so pt only had one pole to manage while walking).     Recommendations for follow up therapy are one component of a multi-disciplinary discharge planning process, led by the attending physician.  Recommendations may be updated based on patient status, additional functional criteria and insurance authorization.  Follow Up Recommendations  Outpatient PT     Assistance Recommended at Discharge Intermittent Supervision/Assistance  Patient can return home with the following A little help with walking and/or transfers;A little help with bathing/dressing/bathroom;Assistance with cooking/housework;Help with stairs or ramp for entrance   Equipment Recommendations  None recommended by PT    Recommendations for Other Services       Precautions / Restrictions Precautions Precautions: Fall;Other (comment) Precaution Comments: lumbar drain to be clamped prior to change of position Restrictions Weight Bearing Restrictions: No     Mobility  Bed Mobility Overal bed mobility: Needs Assistance Bed Mobility: Supine to Sit, Sit to Supine     Supine to sit: Supervision, HOB  elevated Sit to supine: Supervision   General bed mobility comments: no physical assist; managing lines/drain only    Transfers Overall transfer level: Needs assistance Equipment used: Rolling walker (2 wheels) Transfers: Sit to/from Stand Sit to Stand: Min guard           General transfer comment: Min guard and use of the RW for sit to stand    Ambulation/Gait Ambulation/Gait assistance: Min guard Gait Distance (Feet): 250 Feet Assistive device: Rolling walker (2 wheels) Gait Pattern/deviations: Step-through pattern, Decreased stride length, Trunk flexed Gait velocity: decreased Gait velocity interpretation: 1.31 - 2.62 ft/sec, indicative of limited community ambulator   General Gait Details: improved pace; vc for upright posture   Stairs             Wheelchair Mobility    Modified Rankin (Stroke Patients Only)       Balance Overall balance assessment: Needs assistance, History of Falls Sitting-balance support: No upper extremity supported Sitting balance-Leahy Scale: Fair     Standing balance support: No upper extremity supported Standing balance-Leahy Scale: Fair                              Cognition Arousal/Alertness: Awake/alert Behavior During Therapy: WFL for tasks assessed/performed Overall Cognitive Status: Within Functional Limits for tasks assessed                                          Exercises      General Comments General comments (skin integrity, edema, etc.): VSS per monitor      Pertinent  Vitals/Pain Pain Assessment Pain Assessment: Faces Faces Pain Scale: Hurts even more Pain Location: headache Pain Descriptors / Indicators: Aching, Discomfort Pain Intervention(s): Limited activity within patient's tolerance, Monitored during session    Home Living                          Prior Function            PT Goals (current goals can now be found in the care plan section) Acute  Rehab PT Goals Patient Stated Goal: return home, not fall Time For Goal Achievement: 08/04/22 Potential to Achieve Goals: Good Progress towards PT goals: Progressing toward goals    Frequency    Min 3X/week      PT Plan Current plan remains appropriate    Co-evaluation              AM-PAC PT "6 Clicks" Mobility   Outcome Measure  Help needed turning from your back to your side while in a flat bed without using bedrails?: A Little Help needed moving from lying on your back to sitting on the side of a flat bed without using bedrails?: A Little Help needed moving to and from a bed to a chair (including a wheelchair)?: A Little Help needed standing up from a chair using your arms (e.g., wheelchair or bedside chair)?: A Little Help needed to walk in hospital room?: A Little Help needed climbing 3-5 steps with a railing? : A Little 6 Click Score: 18    End of Session Equipment Utilized During Treatment: Gait belt Activity Tolerance: Patient tolerated treatment well Patient left: with call bell/phone within reach;in bed;with bed alarm set;Other (comment) (pt agreed to get up only if he could go back to bed) Nurse Communication: Mobility status;Other (comment) (session done and can reconnect IV) PT Visit Diagnosis: Unsteadiness on feet (R26.81);History of falling (Z91.81)     Time: [id number] PT Time Calculation (min) (ACUTE ONLY): 25 min  Charges:  $Gait Training: 23-37 mins                      Arby Barrette, [place]  Office [phone]    Rexanne Mano 07/22/2022, 2:41 PM

## 2022-07-23 NOTE — Progress Notes (Signed)
PT Cancellation Note  Patient Details Name: Christopher Yu MRN: [id number] DOB: Jun 26, 1960   Cancelled Treatment:    Reason Eval/Treat Not Completed: Pain limiting ability to participate; attempted twice and pt had recently walked with OT then in too much pain and RN aware.  Will follow up.   Reginia Naas 07/23/2022, 5:04 PM Magda Kiel, PT Acute Rehabilitation Services Office:[phone] 07/23/2022

## 2022-07-23 NOTE — Progress Notes (Signed)
  NEUROSURGERY PROGRESS NOTE   No issues overnight.   EXAM:  BP (!) 115/55   Pulse 69   Temp 98.1 F (36.7 C) (Oral)   Resp 17   Ht 6\' 1"  (1.854 m)   Wt 90.3 kg   SpO2 91%   BMI 26.25 kg/m   Awake, alert, oriented  Speech fluent, appropriate  CN grossly intact  5/5 BUE/BLE  Wound c/d/I, no leak, no recurrence of pseudomeningocele LD in place, draining adequately  IMPRESSION:  62 y.o. male POD#5 s/p placement of lumbar drain, repair of pos fossa pseudomeningocele, doing well  PLAN: - cont current pain regimen - Clamp LD today, monitor pseudomeningocele - Cont to mobilize   Consuella Lose, MD [facility]

## 2022-07-23 NOTE — Progress Notes (Signed)
Occupational Therapy Treatment Patient Details Name: Christopher Yu MRN: [id number] DOB: Dec 17, 1960 Today's Date: 07/23/2022   History of present illness 62 yo male presenting 3/15 for surgical repair of  pseudomeningocele now after having craniotomy for resection of AVM approximately 3 months ago.  He continues to complain of generalized fatigue and lack of progress in ambulation. +lumbar drain PMH: for CAD, R TKA, cardiomyopathy, chronic systolic heart failure, macular degeneration.   OT comments  Pt currently min assist for LB dressing sit to stand without use of an assistive device.  Min guard with use as well as for functional mobility in the room and in the hallway.  Encouraged head turns while walking as pt demonstrates limited cervical movement secondary stiffness and pain.  VSS throughout session with pt reporting pain at 7/10 with standing and mobility which was in improvement from yesterday.  Feel he is making steady progress with OT recommend continued acute care rehab to reach modified independent level for home.     Recommendations for follow up therapy are one component of a multi-disciplinary discharge planning process, led by the attending physician.  Recommendations may be updated based on patient status, additional functional criteria and insurance authorization.    Follow Up Recommendations  No OT follow up     Assistance Recommended at Discharge PRN  Patient can return home with the following  Assistance with cooking/housework;A little help with bathing/dressing/bathroom;Help with stairs or ramp for entrance;Assist for transportation   Equipment Recommendations  None recommended by OT       Precautions / Restrictions Precautions Precaution Comments: lumbar drain to be clamped prior to change of position Restrictions Weight Bearing Restrictions: No       Mobility Bed Mobility Overal bed mobility: Needs Assistance Bed Mobility: Supine to Sit, Sit to  Supine     Supine to sit: Supervision, HOB elevated Sit to supine: Supervision   General bed mobility comments: HOB sliglty elevated approximately 25 degrees with pt not needing any physical assist for transitions, just perparation of lines.    Transfers Overall transfer level: Needs assistance Equipment used: Rolling walker (2 wheels) Transfers: Sit to/from Stand Sit to Stand: Min guard           General transfer comment: Min guard and use of the RW for sit to stand     Balance Overall balance assessment: Needs assistance, History of Falls Sitting-balance support: No upper extremity supported Sitting balance-Leahy Scale: Fair     Standing balance support: No upper extremity supported Standing balance-Leahy Scale: Poor Standing balance comment: RW utilized for mobility.  LOB noted posteriorly when standing to pull underpants up over hips.                           ADL either performed or assessed with clinical judgement   ADL Overall ADL's : Needs assistance/impaired                 Upper Body Dressing : Minimal assistance;Sitting Upper Body Dressing Details (indicate cue type and reason): to donn hospital gown like a robe Lower Body Dressing: Sit to/from stand;Minimal assistance Lower Body Dressing Details (indicate cue type and reason): for donning shorts and gripper socks EOB Toilet Transfer: Min guard;Ambulation;Rolling walker (2 wheels) Toilet Transfer Details (indicate cue type and reason): simulated as pt declined need to toilet Toileting- Clothing Manipulation and Hygiene: Sit to/from stand;Min guard Toileting - Clothing Manipulation Details (indicate cue type and reason): simulated sit  to stand.     Functional mobility during ADLs: Min guard;Rolling walker (2 wheels) General ADL Comments: Pt able to mobilize in the room and in the hallway with use of the RW for support and min guard.  He completed LB dressing tasks prior to mobilization but  declined need to toilet or complete grooming or bathing tasks.  VS stable throughout session.  BP at 110/68 in sitting with HR at 67 BPM.      Cognition Arousal/Alertness: Awake/alert Behavior During Therapy: WFL for tasks assessed/performed Overall Cognitive Status: Within Functional Limits for tasks assessed                                                     Pertinent Vitals/ Pain       Pain Assessment Pain Assessment: 0-10 Pain Score: 7  Pain Location: headache Pain Descriptors / Indicators: Aching, Discomfort Pain Intervention(s): Limited activity within patient's tolerance, Repositioned         Frequency  Min 2X/week        Progress Toward Goals  OT Goals(current goals can now be found in the care plan section)  Progress towards OT goals: Progressing toward goals  Acute Rehab OT Goals Patient Stated Goal: Pt wants to get back to playing golf. OT Goal Formulation: With patient/family Time For Goal Achievement: 08/04/22 Potential to Achieve Goals: Good  Plan Discharge plan remains appropriate       AM-PAC OT "6 Clicks" Daily Activity     Outcome Measure   Help from another person eating meals?: None Help from another person taking care of personal grooming?: A Little Help from another person toileting, which includes using toliet, bedpan, or urinal?: A Little Help from another person bathing (including washing, rinsing, drying)?: A Little   Help from another person to put on and taking off regular lower body clothing?: A Little 6 Click Score: 16    End of Session Equipment Utilized During Treatment: Gait belt;Rolling walker (2 wheels)  OT Visit Diagnosis: Unsteadiness on feet (R26.81);Other abnormalities of gait and mobility (R26.89);Repeated falls (R29.6);Muscle weakness (generalized) (M62.81);Pain Pain - part of body:  (posterior head)   Activity Tolerance Patient limited by pain   Patient Left in bed;with family/visitor  present   Nurse Communication Mobility status        Time: 1433-1500 OT Time Calculation (min): 27 min  Charges: OT General Charges $OT Visit: 1 Visit OT Treatments $Self Care/Home Management : 8-22 mins $Therapeutic Activity: 8-22 mins Clyda Greener, OTR/L [place]  Office [phone] 07/23/2022

## 2022-07-24 NOTE — Progress Notes (Signed)
Patient arrived to unit via 4N-ICU. Patient Aox4, vitals WNL and focused assessment completed. Patient oriented to unit, call light in reach, bed in lowest position and bed alarm on. Patient currently resting in bed.

## 2022-07-24 NOTE — Progress Notes (Signed)
Physical Therapy Treatment Patient Details Name: Christopher Yu MRN: [id number] DOB: [birth year] Today's Date: 07/24/2022   History of Present Illness 62 yo male presenting 3/15 for surgical repair of  pseudomeningocele now after having craniotomy for resection of AVM approximately 3 months ago.  He continues to complain of generalized fatigue and lack of progress in ambulation. +lumbar drain (removed 3/21) PMH: for CAD, R TKA, cardiomyopathy, chronic systolic heart failure, macular degeneration.    PT Comments    Patient reports plan for discharge home tomorrow. Wife present and understandably a bit anxious about his return home and continued risk of falling. (He had 2 falls at home PTA). Educated wife on use of gait belt and she reported they have one but pt does not like for her to use it. Patient reports he feels she makes him lose his balance when she holds the belt. Educated that they must use gait belt and she must walk with pt for at least 2 days on discharge home as pt undoubtedly weaker than when he came into hospital. We discussed their technique for up/down steps and recommendation for OPPT. He was getting services at [facility] [place] and felt they were not challenging him. Educated on [facility] on [address] and he is interested in trying that clinic.     Recommendations for follow up therapy are one component of a multi-disciplinary discharge planning process, led by the attending physician.  Recommendations may be updated based on patient status, additional functional criteria and insurance authorization.  Follow Up Recommendations  Outpatient PT (they would like to try [facility] on [address])     Assistance Recommended at Discharge Frequent or constant Supervision/Assistance  Patient can return home with the following A little help with walking and/or transfers;A little help with bathing/dressing/bathroom;Assistance with cooking/housework;Help with stairs or ramp  for entrance   Equipment Recommendations  None recommended by PT    Recommendations for Other Services       Precautions / Restrictions Precautions Precautions: Fall Restrictions Weight Bearing Restrictions: No     Mobility  Bed Mobility               General bed mobility comments: up in recliner    Transfers Overall transfer level: Needs assistance Equipment used: Rolling walker (2 wheels) Transfers: Sit to/from Stand Sit to Stand: Supervision           General transfer comment: proper sequencing to push up from chair and to reach back to chair prior to sitting    Ambulation/Gait Ambulation/Gait assistance: Min guard Gait Distance (Feet): 400 Feet Assistive device: Rolling walker (2 wheels) Gait Pattern/deviations: Step-through pattern, Decreased stride length, Trunk flexed Gait velocity: decreased     General Gait Details: improved pace; no imbalance with turns, walking sideways or backwards   Stairs Stairs:  (discussed how pt does stairs at home and how they maneuver RW (pt stands and holds rail at bottom of steps while wife puts RW up to landing, then she comes down to assist him up)           Wheelchair Mobility    Modified Rankin (Stroke Patients Only)       Balance Overall balance assessment: Needs assistance, History of Falls Sitting-balance support: No upper extremity supported Sitting balance-Leahy Scale: Fair     Standing balance support: No upper extremity supported Standing balance-Leahy Scale: Poor Standing balance comment: RW utilized for mobility.  Wife reports periods of imbalance backwards at home (one of his falls was  backwards                            Cognition Arousal/Alertness: Awake/alert Behavior During Therapy: WFL for tasks assessed/performed Overall Cognitive Status: Within Functional Limits for tasks assessed                                          Exercises      General  Comments General comments (skin integrity, edema, etc.): Discussed pt's falls at home (1. was not using RW and trying to go from bed to bathroom on his own, 2. Had RW with him and lost balance backwards. Wife anxious re: his return home scheduled for tomorrow. Practiced with gait belt with her and gave tips on how to try to slow his fall and protect his head if he were to go backwards. Discussed with pt and wife that she needs to walk with him at all times for at least first couple of days as he acclimates to being home and on carpet/various surfaces. They already have a gait belt at home.      Pertinent Vitals/Pain Pain Assessment Pain Assessment: 0-10 Pain Score: 5  Pain Location: headache Pain Descriptors / Indicators: Aching, Discomfort Pain Intervention(s): Limited activity within patient's tolerance, Monitored during session, Ice applied    Home Living                          Prior Function            PT Goals (current goals can now be found in the care plan section) Acute Rehab PT Goals Patient Stated Goal: return home, not fall Time For Goal Achievement: 08/04/22 Potential to Achieve Goals: Good Progress towards PT goals: Progressing toward goals    Frequency    Min 3X/week      PT Plan Current plan remains appropriate    Co-evaluation              AM-PAC PT "6 Clicks" Mobility   Outcome Measure  Help needed turning from your back to your side while in a flat bed without using bedrails?: None Help needed moving from lying on your back to sitting on the side of a flat bed without using bedrails?: None Help needed moving to and from a bed to a chair (including a wheelchair)?: A Little Help needed standing up from a chair using your arms (e.g., wheelchair or bedside chair)?: A Little Help needed to walk in hospital room?: A Little Help needed climbing 3-5 steps with a railing? : A Little 6 Click Score: 20    End of Session Equipment Utilized  During Treatment: Gait belt Activity Tolerance: Patient tolerated treatment well Patient left: with call bell/phone within reach;in chair;with family/visitor present Nurse Communication: Mobility status;Other (comment) (education with wife due to anxiety) PT Visit Diagnosis: Unsteadiness on feet (R26.81);History of falling (Z91.81)     Time: [id number] PT Time Calculation (min) (ACUTE ONLY): 35 min  Charges:  $Gait Training: 8-22 mins $Self Care/Home Management: Leonardo, [place]  Office [phone]    Rexanne Mano 07/24/2022, 3:33 PM

## 2022-07-24 NOTE — Progress Notes (Signed)
  NEUROSURGERY PROGRESS NOTE   LD disconnected early this am. Pt cont to c/o neck pain/HA  EXAM:  BP 125/60   Pulse 83   Temp 98.1 F (36.7 C) (Oral)   Resp (!) 21   Ht 6\' 1"  (1.854 m)   Wt 90.3 kg   SpO2 94%   BMI 26.25 kg/m   Awake, alert, oriented  Speech fluent, appropriate  CN grossly intact  5/5 BUE/BLE  Wound c/d/I, no leak, no recurrence of pseudomeningocele LD in place, clamped  IMPRESSION:  62 y.o. male POD#6 s/p placement of lumbar drain, repair of pos fossa pseudomeningocele, doing well.   PLAN: - d/c LD today - cont current pain regimen - Cont to mobilize - Pt prefers d/c home tomorrow   Consuella Lose, MD [facility]

## 2022-07-24 NOTE — Progress Notes (Signed)
  Transition of Care [facility]) Screening Note   Patient Details  Name: Christopher Yu Date of Birth: March 24, 1961   Transition of Care [facility]) CM/SW Contact:    Benard Halsted, LCSW Phone Number: 07/24/2022, 9:37 AM    Transition of Care Department [facility]) has reviewed patient. We will continue to monitor patient advancement through interdisciplinary progression rounds. If new patient transition needs arise, please place a TOC consult.

## 2022-07-24 NOTE — Progress Notes (Signed)
TOC following for home health needs.   Gilmore Laroche, MSW, [facility]

## 2022-07-24 NOTE — Progress Notes (Signed)
Patient pulled apart his lumbar drain, the white tubing into his back was still intact but disconnected from the drainage tubing. Neurosurgery aware, tubing tied off, and taped in place. Patient c/o headache and lying flat in bed, will continue to monitor.

## 2022-07-25 MED ORDER — DIAZEPAM 5 MG PO TABS: 5.0000 mg | ORAL_TABLET | Freq: Three times a day (TID) | ORAL | 0 refills | Status: DC | PRN

## 2022-07-25 MED ORDER — HYDROCODONE-ACETAMINOPHEN 5-325 MG PO TABS: 1.0000 | ORAL_TABLET | ORAL | 0 refills | Status: DC | PRN

## 2022-07-25 NOTE — TOC Transition Note (Signed)
Transition of Care (TOC) - CM/SW Discharge Note Marvetta Gibbons RN,BSN Transitions of Care Unit 4NP (Non Trauma)- RN Case Manager See Treatment Team for direct Phone #   Patient Details  Name: Christopher Yu MRN: [id number] Date of Birth: 20-May-1960  Transition of Care [facility]) CM/SW Contact:  Dawayne Patricia, RN Phone Number: 07/25/2022, 11:03 AM   Clinical Narrative:    Pt stable for transition home today, wife to transport home. Per PT note pt requesting referral to Neuro outpt rehab for outpt PT. Referral has been made - Referral # [id number].   No further TOC needs noted.    Final next level of care: OP Rehab Barriers to Discharge: No Barriers Identified   Patient Goals and CMS Choice CMS Medicare.gov Compare Post Acute Care list provided to:: Patient    Discharge Placement                 Home        Discharge Plan and Services Additional resources added to the After Visit Summary for     Discharge Planning Services: CM Consult Post Acute Care Choice: NA          DME Arranged: N/A DME Agency: NA       HH Arranged: NA HH Agency: NA        Social Determinants of Health (SDOH) Interventions SDOH Screenings   Food Insecurity: No Food Insecurity (07/18/2022)  Housing: [place]  (07/18/2022)  Transportation Needs: No Transportation Needs (07/18/2022)  Utilities: Not At Risk (07/18/2022)  Tobacco Use: Medium Risk (07/21/2022)     Readmission Risk Interventions    07/25/2022   11:03 AM  Readmission Risk Prevention Plan  Post Dischage Appt Complete  Medication Screening Complete  Transportation Screening Complete

## 2022-07-25 NOTE — Progress Notes (Signed)
Discharge instructions reviewed with pt and his wife.  Copy of instructions given to pt/wife. Informed scripts sent to his pharmacy for pick up.  Dr Kathyrn Sheriff to see pt during instructions and spoke with pt and wife, he answered any questions she had.  Nurse asked about 2 staples in R lateral scalp as to when should he have them removed, as wife had asked this nurse.  Dr Kathyrn Sheriff ordered to go ahead and remove now before discharging home.  2 staples were removed easily, site unremarkable, no bleeding, no drainage, area with hair, no need for steri strips. PT had worked with pt and wife yesterday per PT notes, they worked on walking,  use of gait belt and walker.   Pt a little unsteady this am, pt does better with walker when stands than without walker. Gait belt obtained and placed on pt for use by wife to aide in getting him into the house when they get home. Wife has his walker in the trunk of car to use when she gets home.  Pt d/c'd via wheelchair with belongings, with wife.              Escorted by nurse to assist getting pt into car.

## 2022-07-25 NOTE — Discharge Summary (Signed)
Physician Discharge Summary  Patient ID: Christopher Yu MRN: [id number] DOB/AGE: 08-27-1960 62 y.o.  Admit date: 07/18/2022 Discharge date: 07/25/2022  Admission Diagnoses:  Pseudomeningocele  Discharge Diagnoses:  Same Principal Problem:   Pseudomeningocele Active Problems:   Pseudomeningocele due to surgical procedure   Discharged Condition: Stable  Hospital Course:  Christopher Yu is a 62 y.o. male admitted after operative repair of pseudomeningocele and placement of lumbar drain. He was monitored in the ICU with drainage of CSF for 5 days. LD was then clamped and removed without recurrence of the pseudo. He was discharged in stable condition  Treatments: Surgery - Repair of pseudomeningocele, placement of lumbar drain.  Discharge Exam: Blood pressure 120/62, pulse 83, temperature 98.2 F (36.8 C), temperature source Oral, resp. rate 17, height 6\' 1"  (1.854 m), weight 90.3 kg, SpO2 96 %. Awake, alert, oriented Speech fluent, appropriate CN grossly intact 5/5 BUE/BLE Wound c/d/i  Disposition: Discharge disposition: 01-Home or Self Care       Discharge Instructions     Call MD for:  redness, tenderness, or signs of infection (pain, swelling, redness, odor or green/yellow discharge around incision site)   Complete by: As directed    Call MD for:  temperature >100.4   Complete by: As directed    Diet - low sodium heart healthy   Complete by: As directed    Discharge instructions   Complete by: As directed    Walk at home as much as possible, at least 4 times / day   Increase activity slowly   Complete by: As directed    Lifting restrictions   Complete by: As directed    No lifting > 10 lbs   May shower / Bathe   Complete by: As directed    48 hours after surgery   May walk up steps   Complete by: As directed    No dressing needed   Complete by: As directed    Other Restrictions   Complete by: As directed    No bending/twisting  at waist      Allergies as of 07/25/2022   No Known Allergies      Medication List     STOP taking these medications    traMADol 50 MG tablet Commonly known as: ULTRAM       TAKE these medications    aspirin 81 MG tablet Take 81 mg by mouth daily.   CALCIUM PO Take 1 tablet by mouth daily.   clindamycin 150 MG capsule Commonly known as: CLEOCIN Take 600 mg by mouth See admin instructions. Take 600mg  by mouth an hour prior to dental appointment.   diazepam 5 MG tablet Commonly known as: VALIUM Take 1 tablet (5 mg total) by mouth every 8 (eight) hours as needed for muscle spasms. What changed:  medication strength how much to take   fluticasone 50 MCG/ACT nasal spray Commonly known as: FLONASE Place 1 spray into both nostrils daily as needed for allergies or rhinitis.   gabapentin 300 MG capsule Commonly known as: NEURONTIN Take 300 mg by mouth 2 (two) times daily.   HYDROcodone-acetaminophen 5-325 MG tablet Commonly known as: NORCO/VICODIN Take 1 tablet by mouth every 4 (four) hours as needed for up to 7 days for moderate pain.   lisinopril 10 MG tablet Commonly known as: ZESTRIL TAKE 1 TABLET DAILY   methotrexate 2.5 MG tablet Commonly known as: RHEUMATREX Take 20 mg by mouth once a week. Takes 8 tablets(20mg ) every Saturday.  Caution:Chemotherapy. Protect from light.   metoprolol succinate 25 MG 24 hr tablet Commonly known as: TOPROL-XL TAKE 1 TABLET DAILY   predniSONE 5 MG tablet Commonly known as: DELTASONE Take 20 mg by mouth daily as needed (Arthritis).   PRESERVISION AREDS 2 PO Take 2 capsules by mouth daily.   Rituxan 500 MG/50ML injection Generic drug: riTUXimab See admin instructions. Every 6 months 2 dose infusion Ruxience   rosuvastatin 20 MG tablet Commonly known as: CRESTOR Take 1 tablet (20 mg total) by mouth daily.   sertraline 50 MG tablet Commonly known as: ZOLOFT Take 50 mg by mouth every morning.   spironolactone  25 MG tablet Commonly known as: ALDACTONE TAKE 1 TABLET DAILY What changed: how much to take   sulfaSALAzine 500 MG tablet Commonly known as: AZULFIDINE Take 1,000 mg by mouth 2 (two) times daily.   Systane Complete 0.6 % Soln Generic drug: Propylene Glycol Place 1 drop into both eyes 2 (two) times daily.   Vitamin D3 50 MCG (2000 UT) Tabs Take 2,000 Units by mouth daily.               Discharge Care Instructions  (From admission, onward)           Start     Ordered   07/25/22 0000  No dressing needed        07/25/22 [id number]            Follow-up Information     Consuella Lose, MD Follow up.   Specialty: Neurosurgery Contact information: [address] [place] [address] [phone]                 Signed: Jairo Ben 07/25/2022, 9:00 AM

## 2022-07-28 ENCOUNTER — Emergency Department (HOSPITAL_COMMUNITY): Payer: BC Managed Care – PPO

## 2022-07-28 ENCOUNTER — Other Ambulatory Visit: Payer: Self-pay

## 2022-07-28 ENCOUNTER — Inpatient Hospital Stay (HOSPITAL_COMMUNITY)
Admission: EM | Admit: 2022-07-28 | Discharge: 2022-07-31 | DRG: 080 | Disposition: A | Discharge status: Rehabilitation Facility | Payer: BC Managed Care – PPO | Attending: Internal Medicine | Admitting: Internal Medicine

## 2022-07-28 ENCOUNTER — Encounter (HOSPITAL_COMMUNITY): Payer: Self-pay | Admitting: Emergency Medicine

## 2022-07-28 DIAGNOSIS — F4320 Adjustment disorder, unspecified: Secondary | ICD-10-CM | POA: Diagnosis not present

## 2022-07-28 DIAGNOSIS — Z6826 Body mass index (BMI) 26.0-26.9, adult: Secondary | ICD-10-CM | POA: Diagnosis not present

## 2022-07-28 DIAGNOSIS — R471 Dysarthria and anarthria: Secondary | ICD-10-CM | POA: Diagnosis not present

## 2022-07-28 DIAGNOSIS — R260 Ataxic gait: Secondary | ICD-10-CM | POA: Diagnosis present

## 2022-07-28 DIAGNOSIS — Z8249 Family history of ischemic heart disease and other diseases of the circulatory system: Secondary | ICD-10-CM | POA: Diagnosis not present

## 2022-07-28 DIAGNOSIS — E7800 Pure hypercholesterolemia, unspecified: Secondary | ICD-10-CM | POA: Diagnosis not present

## 2022-07-28 DIAGNOSIS — G96198 Other disorders of meninges, not elsewhere classified: Secondary | ICD-10-CM | POA: Diagnosis not present

## 2022-07-28 DIAGNOSIS — G936 Cerebral edema: Secondary | ICD-10-CM | POA: Diagnosis present

## 2022-07-28 DIAGNOSIS — I63541 Cerebral infarction due to unspecified occlusion or stenosis of right cerebellar artery: Principal | ICD-10-CM | POA: Diagnosis present

## 2022-07-28 DIAGNOSIS — R531 Weakness: Secondary | ICD-10-CM | POA: Diagnosis not present

## 2022-07-28 DIAGNOSIS — R7303 Prediabetes: Secondary | ICD-10-CM | POA: Diagnosis present

## 2022-07-28 DIAGNOSIS — H3530 Unspecified macular degeneration: Secondary | ICD-10-CM | POA: Diagnosis present

## 2022-07-28 DIAGNOSIS — Z7982 Long term (current) use of aspirin: Secondary | ICD-10-CM

## 2022-07-28 DIAGNOSIS — I959 Hypotension, unspecified: Secondary | ICD-10-CM | POA: Diagnosis not present

## 2022-07-28 DIAGNOSIS — I110 Hypertensive heart disease with heart failure: Secondary | ICD-10-CM | POA: Diagnosis present

## 2022-07-28 DIAGNOSIS — E785 Hyperlipidemia, unspecified: Secondary | ICD-10-CM | POA: Diagnosis not present

## 2022-07-28 DIAGNOSIS — F32A Depression, unspecified: Secondary | ICD-10-CM | POA: Diagnosis not present

## 2022-07-28 DIAGNOSIS — I5042 Chronic combined systolic (congestive) and diastolic (congestive) heart failure: Secondary | ICD-10-CM | POA: Diagnosis present

## 2022-07-28 DIAGNOSIS — M069 Rheumatoid arthritis, unspecified: Secondary | ICD-10-CM | POA: Diagnosis present

## 2022-07-28 DIAGNOSIS — Z7952 Long term (current) use of systemic steroids: Secondary | ICD-10-CM

## 2022-07-28 DIAGNOSIS — M0609 Rheumatoid arthritis without rheumatoid factor, multiple sites: Secondary | ICD-10-CM | POA: Diagnosis not present

## 2022-07-28 DIAGNOSIS — Z981 Arthrodesis status: Secondary | ICD-10-CM | POA: Diagnosis not present

## 2022-07-28 DIAGNOSIS — R1310 Dysphagia, unspecified: Secondary | ICD-10-CM | POA: Diagnosis present

## 2022-07-28 DIAGNOSIS — Z982 Presence of cerebrospinal fluid drainage device: Secondary | ICD-10-CM | POA: Diagnosis not present

## 2022-07-28 DIAGNOSIS — I63549 Cerebral infarction due to unspecified occlusion or stenosis of unspecified cerebellar artery: Secondary | ICD-10-CM | POA: Diagnosis not present

## 2022-07-28 DIAGNOSIS — E43 Unspecified severe protein-calorie malnutrition: Secondary | ICD-10-CM | POA: Diagnosis not present

## 2022-07-28 DIAGNOSIS — I2510 Atherosclerotic heart disease of native coronary artery without angina pectoris: Secondary | ICD-10-CM | POA: Diagnosis present

## 2022-07-28 DIAGNOSIS — G4733 Obstructive sleep apnea (adult) (pediatric): Secondary | ICD-10-CM | POA: Diagnosis not present

## 2022-07-28 DIAGNOSIS — Z79899 Other long term (current) drug therapy: Secondary | ICD-10-CM | POA: Diagnosis not present

## 2022-07-28 DIAGNOSIS — R29818 Other symptoms and signs involving the nervous system: Secondary | ICD-10-CM | POA: Diagnosis not present

## 2022-07-28 DIAGNOSIS — Z87891 Personal history of nicotine dependence: Secondary | ICD-10-CM | POA: Diagnosis not present

## 2022-07-28 DIAGNOSIS — R9431 Abnormal electrocardiogram [ECG] [EKG]: Secondary | ICD-10-CM | POA: Diagnosis not present

## 2022-07-28 DIAGNOSIS — R4701 Aphasia: Secondary | ICD-10-CM | POA: Diagnosis present

## 2022-07-28 DIAGNOSIS — R4781 Slurred speech: Secondary | ICD-10-CM | POA: Diagnosis not present

## 2022-07-28 DIAGNOSIS — Z91199 Patient's noncompliance with other medical treatment and regimen due to unspecified reason: Secondary | ICD-10-CM

## 2022-07-28 DIAGNOSIS — I5022 Chronic systolic (congestive) heart failure: Secondary | ICD-10-CM | POA: Diagnosis not present

## 2022-07-28 DIAGNOSIS — I429 Cardiomyopathy, unspecified: Secondary | ICD-10-CM | POA: Diagnosis not present

## 2022-07-28 DIAGNOSIS — E663 Overweight: Secondary | ICD-10-CM | POA: Diagnosis present

## 2022-07-28 DIAGNOSIS — T380X5A Adverse effect of glucocorticoids and synthetic analogues, initial encounter: Secondary | ICD-10-CM | POA: Diagnosis not present

## 2022-07-28 DIAGNOSIS — I493 Ventricular premature depolarization: Secondary | ICD-10-CM | POA: Diagnosis present

## 2022-07-28 DIAGNOSIS — M0520 Rheumatoid vasculitis with rheumatoid arthritis of unspecified site: Secondary | ICD-10-CM | POA: Diagnosis not present

## 2022-07-28 DIAGNOSIS — R519 Headache, unspecified: Secondary | ICD-10-CM | POA: Diagnosis not present

## 2022-07-28 DIAGNOSIS — R29703 NIHSS score 3: Secondary | ICD-10-CM | POA: Diagnosis present

## 2022-07-28 DIAGNOSIS — Z96651 Presence of right artificial knee joint: Secondary | ICD-10-CM | POA: Diagnosis present

## 2022-07-28 DIAGNOSIS — Z832 Family history of diseases of the blood and blood-forming organs and certain disorders involving the immune mechanism: Secondary | ICD-10-CM

## 2022-07-28 DIAGNOSIS — G4700 Insomnia, unspecified: Secondary | ICD-10-CM | POA: Diagnosis not present

## 2022-07-28 DIAGNOSIS — I639 Cerebral infarction, unspecified: Secondary | ICD-10-CM | POA: Diagnosis not present

## 2022-07-28 DIAGNOSIS — W19XXXA Unspecified fall, initial encounter: Secondary | ICD-10-CM | POA: Diagnosis not present

## 2022-07-28 DIAGNOSIS — I69993 Ataxia following unspecified cerebrovascular disease: Secondary | ICD-10-CM | POA: Diagnosis not present

## 2022-07-28 DIAGNOSIS — R1312 Dysphagia, oropharyngeal phase: Secondary | ICD-10-CM | POA: Diagnosis not present

## 2022-07-28 DIAGNOSIS — G4489 Other headache syndrome: Secondary | ICD-10-CM | POA: Diagnosis not present

## 2022-07-28 DIAGNOSIS — I63233 Cerebral infarction due to unspecified occlusion or stenosis of bilateral carotid arteries: Secondary | ICD-10-CM | POA: Diagnosis not present

## 2022-07-28 DIAGNOSIS — R270 Ataxia, unspecified: Secondary | ICD-10-CM | POA: Diagnosis not present

## 2022-07-28 DIAGNOSIS — R29810 Facial weakness: Secondary | ICD-10-CM | POA: Diagnosis not present

## 2022-07-28 DIAGNOSIS — I679 Cerebrovascular disease, unspecified: Secondary | ICD-10-CM | POA: Diagnosis not present

## 2022-07-28 DIAGNOSIS — R000 Tachycardia, unspecified: Secondary | ICD-10-CM | POA: Diagnosis present

## 2022-07-28 DIAGNOSIS — I25810 Atherosclerosis of coronary artery bypass graft(s) without angina pectoris: Secondary | ICD-10-CM | POA: Diagnosis not present

## 2022-07-28 DIAGNOSIS — I6389 Other cerebral infarction: Secondary | ICD-10-CM | POA: Diagnosis not present

## 2022-07-28 DIAGNOSIS — G910 Communicating hydrocephalus: Secondary | ICD-10-CM | POA: Diagnosis not present

## 2022-07-28 LAB — COMPREHENSIVE METABOLIC PANEL
ALT: 20 U/L (ref 0–44)
AST: 23 U/L (ref 15–41)
Albumin: 3.9 g/dL (ref 3.5–5.0)
Alkaline Phosphatase: 65 U/L (ref 38–126)
Anion gap: 12 (ref 5–15)
BUN: 11 mg/dL (ref 6–20)
CO2: 27 mmol/L (ref 22–32)
Calcium: 9.5 mg/dL (ref 8.9–10.3)
Chloride: 100 mmol/L (ref 98–111)
Creatinine, Ser: 0.92 mg/dL (ref 0.61–1.24)
GFR, Estimated: >60 mL/min (ref >60)
Glucose, Bld: 116 mg/dL — ABNORMAL HIGH (ref 70–99)
Potassium: 4.5 mmol/L (ref 3.5–5.1)
Sodium: 139 mmol/L (ref 135–145)
Total Bilirubin: 0.4 mg/dL (ref 0.3–1.2)
Total Protein: 7.3 g/dL (ref 6.5–8.1)

## 2022-07-28 LAB — CBC WITH DIFFERENTIAL/PLATELET
Abs Immature Granulocytes: 0.04 10*3/uL (ref 0.00–0.07)
Basophils Absolute: 0.1 10*3/uL (ref 0.0–0.1)
Basophils Relative: 1 %
Eosinophils Absolute: 0.6 10*3/uL — ABNORMAL HIGH (ref 0.0–0.5)
Eosinophils Relative: 6 %
HCT: 38 % — ABNORMAL LOW (ref 39.0–52.0)
Hemoglobin: 11.5 g/dL — ABNORMAL LOW (ref 13.0–17.0)
Immature Granulocytes: 0 %
Lymphocytes Relative: 14 %
Lymphs Abs: 1.5 10*3/uL (ref 0.7–4.0)
MCH: 23 pg — ABNORMAL LOW (ref 26.0–34.0)
MCHC: 30.3 g/dL (ref 30.0–36.0)
MCV: 76 fL — ABNORMAL LOW (ref 80.0–100.0)
Monocytes Absolute: 0.5 10*3/uL (ref 0.1–1.0)
Monocytes Relative: 4 %
Neutro Abs: 8.2 10*3/uL — ABNORMAL HIGH (ref 1.7–7.7)
Neutrophils Relative %: 75 %
Platelets: 240 10*3/uL (ref 150–400)
RBC: 5 MIL/uL (ref 4.22–5.81)
RDW: 19.1 % — ABNORMAL HIGH (ref 11.5–15.5)
WBC: 10.9 10*3/uL — ABNORMAL HIGH (ref 4.0–10.5)
nRBC: 0 % (ref 0.0–0.2)

## 2022-07-28 MED ORDER — SENNOSIDES-DOCUSATE SODIUM 8.6-50 MG PO TABS: 1.0000 | ORAL_TABLET | Freq: Every evening | ORAL | Status: DC | PRN

## 2022-07-28 MED ORDER — SERTRALINE HCL 50 MG PO TABS
50.0000 mg | ORAL_TABLET | Freq: Every morning | ORAL | Status: DC
  Administered 2022-07-29 – 2022-07-31 (×3): 50 mg via ORAL
  Filled 2022-07-28 (×3): qty 1

## 2022-07-28 MED ORDER — SPIRONOLACTONE 12.5 MG HALF TABLET
12.5000 mg | ORAL_TABLET | Freq: Every day | ORAL | Status: DC
  Administered 2022-07-29: 12.5 mg via ORAL
  Filled 2022-07-28: qty 1

## 2022-07-28 MED ORDER — LACTATED RINGERS IV SOLN: INTRAVENOUS | Status: DC

## 2022-07-28 MED ORDER — STROKE: EARLY STAGES OF RECOVERY BOOK
Freq: Once | Status: AC
  Filled 2022-07-28: qty 1

## 2022-07-28 MED ORDER — ACETAMINOPHEN 325 MG PO TABS
650.0000 mg | ORAL_TABLET | ORAL | Status: DC | PRN
  Administered 2022-07-29 – 2022-07-31 (×2): 650 mg via ORAL
  Filled 2022-07-28 (×2): qty 2

## 2022-07-28 MED ORDER — DIAZEPAM 5 MG PO TABS
5.0000 mg | ORAL_TABLET | Freq: Three times a day (TID) | ORAL | Status: DC | PRN
  Administered 2022-07-29: 5 mg via ORAL
  Filled 2022-07-28: qty 1

## 2022-07-28 MED ORDER — DIAZEPAM 5 MG PO TABS
5.0000 mg | ORAL_TABLET | Freq: Once | ORAL | Status: AC
  Administered 2022-07-28: 5 mg via ORAL
  Filled 2022-07-28: qty 1

## 2022-07-28 MED ORDER — ASPIRIN 81 MG PO TABS: 81.0000 mg | ORAL_TABLET | Freq: Every day | ORAL | Status: DC

## 2022-07-28 MED ORDER — ENOXAPARIN SODIUM 40 MG/0.4ML IJ SOSY
40.0000 mg | PREFILLED_SYRINGE | INTRAMUSCULAR | Status: DC
  Administered 2022-07-28 – 2022-07-30 (×3): 40 mg via SUBCUTANEOUS
  Filled 2022-07-28 (×3): qty 0.4

## 2022-07-28 MED ORDER — ACETAMINOPHEN 160 MG/5ML PO SOLN
650.0000 mg | ORAL | Status: DC | PRN
  Filled 2022-07-28: qty 20.3

## 2022-07-28 MED ORDER — ROSUVASTATIN CALCIUM 20 MG PO TABS
20.0000 mg | ORAL_TABLET | Freq: Every day | ORAL | Status: DC
  Administered 2022-07-29 – 2022-07-31 (×3): 20 mg via ORAL
  Filled 2022-07-28 (×3): qty 1

## 2022-07-28 MED ORDER — OXYCODONE-ACETAMINOPHEN 5-325 MG PO TABS
1.0000 | ORAL_TABLET | Freq: Once | ORAL | Status: AC
  Administered 2022-07-28: 1 via ORAL
  Filled 2022-07-28: qty 1

## 2022-07-28 MED ORDER — ACETAMINOPHEN 650 MG RE SUPP: 650.0000 mg | RECTAL | Status: DC | PRN

## 2022-07-28 MED ORDER — ASPIRIN 81 MG PO TBEC
81.0000 mg | DELAYED_RELEASE_TABLET | Freq: Every day | ORAL | Status: DC
  Administered 2022-07-29 – 2022-07-31 (×3): 81 mg via ORAL
  Filled 2022-07-28 (×3): qty 1

## 2022-07-28 NOTE — H&P (Signed)
History and Physical    Christopher Yu [id number] DOB: Jun 14, 1960 DOA: 07/28/2022  PCP: Charlane Ferretti, MD   Patient coming from: Home   Chief Complaint: Gait difficulty, slurred speech   HPI: Christopher Yu is a 62 y.o. male with medical history significant for rheumatoid arthritis, depression, CAD, OSA with CPAP intolerance, chronic systolic CHF, AVM resection in December 2023, and repair of pseudomeningocele on 07/18/2022, presenting to the emergency department with gait difficulty and slurred speech.  Patient's wife is at the bedside and assists with the history.  Patient reportedly had some dysarthria while still in the hospital that was attributed to sedating medications.  Dysarthria was noted to have worsened significantly on 07/26/2022 and did not improve when his wife withheld his analgesics.  He has also had increasing difficulty with gait and balance over the past 3 days.  ED Course: Upon arrival to the ED, patient is found to be afebrile and saturating mid 90s on room air with normal respiratory rate, normal heart rate, and stable blood pressure.  EKG demonstrates sinus tachycardia with rate 1 1, PVCs, and LAFB.  MRI brain notable for new small focus of acute ischemia in right superior cerebellar hemisphere.  Findings were discussed with neurosurgery, neurology was consulted by the ED physician, and the patient was treated with Valium and Percocet in the ED.  Review of Systems:  All other systems reviewed and apart from HPI, are negative.  Past Medical History:  Diagnosis Date   Anxiety    Arthritis, rheumatoid ([place])    dx 1988   CAD (coronary artery disease), native coronary artery    Mild LAD disease with calcification noted at cath 12//27/16    Cardiomyopathy ([place])    Cardiomyopathy ([place])    Chronic systolic heart failure ([place]) 05/01/2015   Depression    Deviated nasal septum 06/25/2011   Headache    High cholesterol    Hyperlipidemia     Impingement syndrome of left shoulder 12/22/2017   Macular degeneration    Rheumatoid aortitis    Sleep apnea    does not use cpap - UNABLE TO TOLERATE MASK   Sleep apnea in adult    deviated septum repaired, most recent sleep study was negative   Status post total right knee replacement 06/01/2015    Past Surgical History:  Procedure Laterality Date   ANKLE FUSION Right 05/05/2012   related to his arthritis   ANKLE FUSION Left 05/25/2013   related to his arthritis   ANKLE SURGERY Bilateral    APPLICATION OF CRANIAL NAVIGATION N/A 04/11/2022   Procedure: APPLICATION OF CRANIAL NAVIGATION;  Surgeon: Consuella Lose, MD;  Location: [facility];  Service: Neurosurgery;  Laterality: N/A;   CARDIAC CATHETERIZATION N/A 05/01/2015   Procedure: Left Heart Cath and Coronary Angiography;  Surgeon: Belva Crome, MD;  Location: [facility];  Service: Cardiovascular;  Laterality: N/A;   CRANIOTOMY N/A 04/11/2022   Procedure: STEREOTACTIC SUBOCCIPITAL CRANIOTOMY FOR RESECTION OF ARTERIO-VENOUS MALFORMATION;  Surgeon: Consuella Lose, MD;  Location: [facility];  Service: Neurosurgery;  Laterality: N/A;   FRACTURE SURGERY     left femur fracture x 3   IR ANGIO EXTERNAL CAROTID SEL EXT CAROTID BILAT MOD SED  01/28/2022   IR ANGIO INTRA EXTRACRAN SEL INTERNAL CAROTID BILAT MOD SED  01/28/2022   IR ANGIO INTRA EXTRACRAN SEL INTERNAL CAROTID BILAT MOD SED  04/16/2022   IR ANGIO VERTEBRAL SEL VERTEBRAL BILAT MOD SED  01/28/2022   IR ANGIO VERTEBRAL  SEL VERTEBRAL BILAT MOD SED  04/16/2022   KNEE ARTHROSCOPY     right x 4   LUMBAR LAMINECTOMY/DECOMPRESSION MICRODISCECTOMY N/A 07/18/2022   Procedure: Repair of Pseudomeningiocele posterior;  Surgeon: Consuella Lose, MD;  Location: [facility];  Service: Neurosurgery;  Laterality: N/A;   NASAL SEPTOPLASTY W/ TURBINOPLASTY  06/25/2011   Procedure: NASAL SEPTOPLASTY WITH TURBINATE REDUCTION;  Surgeon: Jerrell Belfast, MD;  Location: [facility];  Service: ENT;   Laterality: Bilateral;   PLACEMENT OF LUMBAR DRAIN N/A 07/18/2022   Procedure: PLACEMENT OF LUMBAR DRAIN;  Surgeon: Consuella Lose, MD;  Location: [facility];  Service: Neurosurgery;  Laterality: N/A;   TONSILLECTOMY     TOTAL KNEE ARTHROPLASTY Right 06/01/2015   Procedure: RIGHT TOTAL KNEE ARTHROPLASTY;  Surgeon: Mcarthur Rossetti, MD;  Location: [facility];  Service: Orthopedics;  Laterality: Right;  Block+general    Social History:   reports that he quit smoking about 14 years ago. His smoking use included cigarettes. He has a 10.00 pack-year smoking history. He has never used smokeless tobacco. He reports current alcohol use. He reports that he does not use drugs.  No Known Allergies  Family History  Problem Relation Age of Onset   Lupus Sister      Prior to Admission medications   Medication Sig Start Date End Date Taking? Authorizing Provider  aspirin 81 MG tablet Take 81 mg by mouth daily.    [provider]  CALCIUM PO Take 1 tablet by mouth daily.    [provider]  Cholecalciferol (VITAMIN D3) 50 MCG (2000 UT) TABS Take 2,000 Units by mouth daily.    [provider]  clindamycin (CLEOCIN) 150 MG capsule Take 600 mg by mouth See admin instructions. Take 600mg  by mouth an hour prior to dental appointment.    [provider]  diazepam (VALIUM) 5 MG tablet Take 1 tablet (5 mg total) by mouth every 8 (eight) hours as needed for muscle spasms. 07/25/22   Consuella Lose, MD  fluticasone (FLONASE) 50 MCG/ACT nasal spray Place 1 spray into both nostrils daily as needed for allergies or rhinitis.    [provider]  gabapentin (NEURONTIN) 300 MG capsule Take 300 mg by mouth 2 (two) times daily. 11/07/21   [provider]  HYDROcodone-acetaminophen (NORCO/VICODIN) 5-325 MG tablet Take 1 tablet by mouth every 4 (four) hours as needed for up to 7 days for moderate pain. 07/25/22 08/01/22  Consuella Lose, MD  lisinopril (ZESTRIL) 10 MG  tablet TAKE 1 TABLET DAILY Patient not taking: Reported on 05/09/2022 04/03/22   Jettie Booze, MD  methotrexate (RHEUMATREX) 2.5 MG tablet Take 20 mg by mouth once a week. Takes 8 tablets(20mg ) every Saturday.     Caution:Chemotherapy. Protect from light.    [provider]  metoprolol succinate (TOPROL-XL) 25 MG 24 hr tablet TAKE 1 TABLET DAILY 07/02/22   Jettie Booze, MD  Multiple Vitamins-Minerals (PRESERVISION AREDS 2 PO) Take 2 capsules by mouth daily.    [provider]  predniSONE (DELTASONE) 5 MG tablet Take 20 mg by mouth daily as needed (Arthritis). 08/08/19   [provider]  Propylene Glycol (SYSTANE COMPLETE) 0.6 % SOLN Place 1 drop into both eyes 2 (two) times daily.    [provider]  RITUXAN 500 MG/50ML injection See admin instructions. Every 6 months 2 dose infusion Ruxience Patient not taking: Reported on 04/22/2022 06/14/19   [provider]  rosuvastatin (CRESTOR) 20 MG tablet Take 1 tablet (20 mg total)  by mouth daily. 02/24/22   Jettie Booze, MD  sertraline (ZOLOFT) 50 MG tablet Take 50 mg by mouth every morning.    [provider]  spironolactone (ALDACTONE) 25 MG tablet TAKE 1 TABLET DAILY Patient taking differently: Take 12.5 mg by mouth daily. 07/02/22   Jettie Booze, MD  sulfaSALAzine (AZULFIDINE) 500 MG tablet Take 1,000 mg by mouth 2 (two) times daily.     [provider]    Physical Exam: Vitals:   07/28/22 1715 07/28/22 2015 07/28/22 2021 07/28/22 2030  BP: 113/80 113/67  110/68  Pulse: 91 89  83  Resp: 19 11  14   Temp:   98.7 F (37.1 C)   TempSrc:   Oral   SpO2: 94% 94%  95%  Weight:      Height:         Constitutional: NAD, no pallor or diaphoresis   Eyes: PERTLA, lids and conjunctivae normal ENMT: Mucous membranes are moist. Posterior pharynx clear of any exudate or lesions.   Neck: supple, no masses  Respiratory: no wheezing, no crackles. No accessory muscle  use.  Cardiovascular: S1 & S2 heard, regular rate and rhythm. No extremity edema.   Abdomen: No distension, no tenderness, soft. Bowel sounds active.  Musculoskeletal: no clubbing / cyanosis. No joint deformity upper and lower extremities.   Skin: no significant rashes, lesions, ulcers. Warm, dry, well-perfused. Neurologic: CN 2-12 grossly intact. Sensation intact. Strength 5/5 in all 4 limbs. Alert and oriented.  Psychiatric: Calm. Cooperative.    Labs and Imaging on Admission: I have personally reviewed following labs and imaging studies  CBC: Recent Labs  Lab 07/28/22 1644  WBC 10.9*  NEUTROABS 8.2*  HGB 11.5*  HCT 38.0*  MCV 76.0*  PLT [id number]   Basic Metabolic Panel: Recent Labs  Lab 07/28/22 1644  NA 139  K 4.5  CL 100  CO2 27  GLUCOSE 116*  BUN 11  CREATININE 0.92  CALCIUM 9.5   GFR: Estimated Creatinine Clearance: 96.5 mL/min (by C-G formula based on SCr of 0.92 mg/dL). Liver Function Tests: Recent Labs  Lab 07/28/22 1644  AST 23  ALT 20  ALKPHOS 65  BILITOT 0.4  PROT 7.3  ALBUMIN 3.9   No results for input(s): "LIPASE", "AMYLASE" in the last 168 hours. No results for input(s): "AMMONIA" in the last 168 hours. Coagulation Profile: No results for input(s): "INR", "PROTIME" in the last 168 hours. Cardiac Enzymes: No results for input(s): "CKTOTAL", "CKMB", "CKMBINDEX", "TROPONINI" in the last 168 hours. BNP (last 3 results) No results for input(s): "PROBNP" in the last 8760 hours. HbA1C: No results for input(s): "HGBA1C" in the last 72 hours. CBG: No results for input(s): "GLUCAP" in the last 168 hours. Lipid Profile: No results for input(s): "CHOL", "HDL", "LDLCALC", "TRIG", "CHOLHDL", "LDLDIRECT" in the last 72 hours. Thyroid Function Tests: No results for input(s): "TSH", "T4TOTAL", "FREET4", "T3FREE", "THYROIDAB" in the last 72 hours. Anemia Panel: No results for input(s): "VITAMINB12", "FOLATE", "FERRITIN", "TIBC", "IRON", "RETICCTPCT" in the  last 72 hours. Urine analysis:    Component Value Date/Time   COLORURINE YELLOW 04/03/2022 0816   APPEARANCEUR CLEAR 04/03/2022 0816   LABSPEC 1.018 04/03/2022 0816   PHURINE 5.0 04/03/2022 0816   GLUCOSEU NEGATIVE 04/03/2022 0816   HGBUR NEGATIVE 04/03/2022 0816   BILIRUBINUR NEGATIVE 04/03/2022 0816   KETONESUR NEGATIVE 04/03/2022 0816   PROTEINUR NEGATIVE 04/03/2022 0816   NITRITE NEGATIVE 04/03/2022 0816   LEUKOCYTESUR NEGATIVE 04/03/2022 0816   Sepsis Labs: @LABRCNTIP (procalcitonin:4,lacticidven:4) )  No results found for this or any previous visit (from the past 240 hour(s)).   Radiological Exams on Admission: MR Brain Wo Contrast (neuro protocol)  Result Date: 07/28/2022 CLINICAL DATA:  Neuro deficit, acute, stroke suspected. EXAM: MRI HEAD WITHOUT CONTRAST TECHNIQUE: Multiplanar, multiecho pulse sequences of the brain and surrounding structures were obtained without intravenous contrast. COMPARISON:  Head CT 07/28/2022.  MRI brain 07/05/2022. FINDINGS: Brain: New small focus of acute infarction in the right superior cerebellar hemisphere with surrounding area of increased T2 signal and cystic change, which is new since the prior MRI dated 07/05/2022 and suspicious for now chronic infarct. Postoperative changes of prior left posterior fossa resection. Redemonstrated foci of gas within the resection cavity, fourth ventricle and extra-axial spaces, likely secondary to recent pseudomeningocele repair and/or lumbar drain placement/removal. No acute intracranial hemorrhage.  No hydrocephalus. Vascular: Normal flow voids. Skull and upper cervical spine: Suboccipital craniectomy. Sinuses/Orbits: Trace fluid in the right sphenoid sinus. Other: Decreased size of the fluid collection in the dorsal soft tissues overlying the suboccipital craniectomy. IMPRESSION: 1. New small focus of acute infarction in the right superior cerebellar hemisphere with surrounding area of increased T2 signal and cystic  change, which is new since the prior MRI dated 07/05/2022 and suspicious for now chronic infarct. 2. Postoperative changes of prior left posterior fossa resection. Redemonstrated foci of gas within the resection cavity, fourth ventricle and extra-axial spaces, likely secondary to recent pseudomeningocele repair and/or lumbar drain placement/removal. 3. Decreased size of the fluid collection in the dorsal soft tissues overlying the suboccipital craniectomy. Electronically Signed   By: Emmit Alexanders M.D.   On: 07/28/2022 20:06   CT Head Wo Contrast  Result Date: 07/28/2022 CLINICAL DATA:  Headache, neuro deficit. EXAM: CT HEAD WITHOUT CONTRAST TECHNIQUE: Contiguous axial images were obtained from the base of the skull through the vertex without intravenous contrast. RADIATION DOSE REDUCTION: This exam was performed according to the departmental dose-optimization program which includes automated exposure control, adjustment of the mA and/or kV according to patient size and/or use of iterative reconstruction technique. COMPARISON:  Head CT 04/29/2022.  MRI brain 07/05/2022. 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 New pneumocephalus in the resection cavity, likely secondary to recent pseudomeningocele repair and lumbar drain placement/removal. No acute intracranial hemorrhage, mass effect or midline shift. No hydrocephalus. Vascular: No hyperdense vessel or unexpected calcification. Skull: Suboccipital craniectomy. Sinuses/Orbits: Trace fluid in the sphenoid sinus. Orbits are unremarkable. Other: Partially imaged fluid collection in the dorsal soft tissues overlying the suboccipital craniectomy, likely residual or recurrent pseudomeningocele. IMPRESSION: 1. 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 New  pneumocephalus in the resection cavity, likely secondary to recent pseudomeningocele repair and lumbar drain placement/removal. No acute intracranial hemorrhage. 2. Partially imaged fluid collection in the dorsal soft tissues overlying the suboccipital craniectomy, likely residual or recurrent pseudomeningocele. Electronically Signed   By: Emmit Alexanders M.D.   On: 07/28/2022 17:15    EKG: Independently reviewed. Sinus tachycardia, rate 101, PVCs, LAFB, QTc 523.   Assessment/Plan   1. Ischemic CVA  - ***  -  -  -  -  -  -  -   2. CAD  - No anginal complaints  - Continue ASA, Crestor    3. Chronic HFrEF  - EF was 40% in 2019  - Appears compensated  - Continue Aldactone, monitor wt and I/Os   4. Depression  -  Continue Zoloft   5. Rheumatoid arthritis  - Pt reports being overdue for Rituxan infusion but is asymptomatic at this time    DVT prophylaxis: Lovenox  Code Status: Full  Level of Care: Level of care: Telemetry Medical Family Communication: Wife at bedside  Disposition Plan:  Patient is from: Home   Anticipated d/c is to: TBD Anticipated d/c date is: 3/26 or 07/30/22  Patient currently: Pending CVA workup  Consults called: Neurology  Admission status: Observation     Vianne Bulls, MD [facility]  07/28/2022, 10:26 PM

## 2022-07-28 NOTE — ED Provider Notes (Signed)
[facility] Provider Note   CSN: [id number] Arrival date & time: 07/28/22  1558     History  Chief Complaint  Patient presents with   Aphasia   Weakness    Christopher Yu is a 62 y.o. male.  62 year old male presents with dysarthria time several days.  Patient recently had drainage of a pseudomeningocele done and was discharged in the hospital 2 days ago.  Patient notes some new headache.  He has had nausea but no vomiting.  No fever or chills.  Wife at bedside states that patient has been on pain medications which took him off but his symptoms did not improve.  Denies any new falls.  Patient with left the hospital had to use a walker with a gait belt.  Wife states that his gait has become worse.  Called the neurosurgery office and told to come here       Home Medications Prior to Admission medications   Medication Sig Start Date End Date Taking? Authorizing Provider  aspirin 81 MG tablet Take 81 mg by mouth daily.    [provider]  CALCIUM PO Take 1 tablet by mouth daily.    [provider]  Cholecalciferol (VITAMIN D3) 50 MCG (2000 UT) TABS Take 2,000 Units by mouth daily.    [provider]  clindamycin (CLEOCIN) 150 MG capsule Take 600 mg by mouth See admin instructions. Take 600mg  by mouth an hour prior to dental appointment.    [provider]  diazepam (VALIUM) 5 MG tablet Take 1 tablet (5 mg total) by mouth every 8 (eight) hours as needed for muscle spasms. 07/25/22   Consuella Lose, MD  fluticasone (FLONASE) 50 MCG/ACT nasal spray Place 1 spray into both nostrils daily as needed for allergies or rhinitis.    [provider]  gabapentin (NEURONTIN) 300 MG capsule Take 300 mg by mouth 2 (two) times daily. 11/07/21   [provider]  HYDROcodone-acetaminophen (NORCO/VICODIN) 5-325 MG tablet Take 1 tablet by mouth every 4 (four) hours as needed for up to 7 days for  moderate pain. 07/25/22 08/01/22  Consuella Lose, MD  lisinopril (ZESTRIL) 10 MG tablet TAKE 1 TABLET DAILY Patient not taking: Reported on 05/09/2022 04/03/22   Jettie Booze, MD  methotrexate (RHEUMATREX) 2.5 MG tablet Take 20 mg by mouth once a week. Takes 8 tablets(20mg ) every Saturday.     Caution:Chemotherapy. Protect from light.    [provider]  metoprolol succinate (TOPROL-XL) 25 MG 24 hr tablet TAKE 1 TABLET DAILY 07/02/22   Jettie Booze, MD  Multiple Vitamins-Minerals (PRESERVISION AREDS 2 PO) Take 2 capsules by mouth daily.    [provider]  predniSONE (DELTASONE) 5 MG tablet Take 20 mg by mouth daily as needed (Arthritis). 08/08/19   [provider]  Propylene Glycol (SYSTANE COMPLETE) 0.6 % SOLN Place 1 drop into both eyes 2 (two) times daily.    [provider]  RITUXAN 500 MG/50ML injection See admin instructions. Every 6 months 2 dose infusion Ruxience Patient not taking: Reported on 04/22/2022 06/14/19   [provider]  rosuvastatin (CRESTOR) 20 MG tablet Take 1 tablet (20 mg total) by mouth daily. 02/24/22   Jettie Booze, MD  sertraline (ZOLOFT) 50 MG tablet Take 50 mg by mouth every morning.    [provider]  spironolactone (ALDACTONE) 25 MG tablet TAKE 1 TABLET DAILY Patient taking differently: Take 12.5 mg by mouth daily. 07/02/22  Jettie Booze, MD  sulfaSALAzine (AZULFIDINE) 500 MG tablet Take 1,000 mg by mouth 2 (two) times daily.     [provider]      Allergies    Patient has no known allergies.    Review of Systems   Review of Systems  All other systems reviewed and are negative.   Physical Exam Updated Vital Signs BP 108/72   Pulse 65   Temp 98.9 F (37.2 C)   Resp 18   Ht 1.854 m (6\' 1" )   Wt 90.3 kg   SpO2 96%   BMI 26.25 kg/m  Physical Exam Vitals and nursing note reviewed.  Constitutional:      General: He is not in acute distress.    Appearance:  Normal appearance. He is well-developed. He is not toxic-appearing.  HENT:     Head: Normocephalic and atraumatic.  Eyes:     General: Lids are normal.     Conjunctiva/sclera: Conjunctivae normal.     Pupils: Pupils are equal, round, and reactive to light.  Neck:     Thyroid: No thyroid mass.     Trachea: No tracheal deviation.  Cardiovascular:     Rate and Rhythm: Normal rate and regular rhythm.     Heart sounds: Normal heart sounds. No murmur heard.    No gallop.  Pulmonary:     Effort: Pulmonary effort is normal. No respiratory distress.     Breath sounds: Normal breath sounds. No stridor. No decreased breath sounds, wheezing, rhonchi or rales.  Abdominal:     General: There is no distension.     Palpations: Abdomen is soft.     Tenderness: There is no abdominal tenderness. There is no rebound.  Musculoskeletal:        General: No tenderness. Normal range of motion.     Cervical back: Normal range of motion and neck supple.  Skin:    General: Skin is warm and dry.     Findings: No abrasion or rash.  Neurological:     Mental Status: He is alert and oriented to person, place, and time. Mental status is at baseline.     GCS: GCS eye subscore is 4. GCS verbal subscore is 5. GCS motor subscore is 6.     Cranial Nerves: No cranial nerve deficit or facial asymmetry.     Sensory: No sensory deficit.     Motor: Motor function is intact.     Comments: Dysarthria noted Strength is 5 of 5 in upper as well as lower extremities  Psychiatric:        Attention and Perception: Attention normal.        Mood and Affect: Affect is flat.        Speech: Speech is delayed.        Behavior: Behavior normal.     ED Results / Procedures / Treatments   Labs (all labs ordered are listed, but only abnormal results are displayed) Labs Reviewed  CBC WITH DIFFERENTIAL/PLATELET  COMPREHENSIVE METABOLIC PANEL    EKG EKG Interpretation  Date/Time:  Monday July 28 2022 16:14:44 EDT Ventricular  Rate:  101 PR Interval:  150 QRS Duration: 94 QT Interval:  404 QTC Calculation: 523 R Axis:   -54 Text Interpretation: Sinus tachycardia with occasional Premature ventricular complexes Pulmonary disease pattern Left anterior fascicular block Nonspecific ST and T wave abnormality Prolonged QT Abnormal ECG When compared with ECG of 20-Apr-2015 10:39, PREVIOUS ECG IS PRESENT Confirmed by Lacretia Leigh ([id number]) on  07/28/2022 4:21:34 PM  Radiology No results found.  Procedures Procedures    Medications Ordered in ED Medications  lactated ringers infusion ( Intravenous New Bag/Given 07/28/22 1643)    ED Course/ Medical Decision Making/ A&P                             Medical Decision Making Amount and/or Complexity of Data Reviewed Labs: ordered. Radiology: ordered. ECG/medicine tests: ordered.  Risk Prescription drug management.  Patient is EKG per my interpretation shows sinus tach with occasional PVCs. Head CT per my interpretation showed no acute findings.  Subsequently had MRI of his brain which was positive for acute CVA.  Patient symptoms began several days ago and therefore he is out of any intervention window.  Discussed with neurosurgeon on-call, Dr. Saintclair Halsted and he is aware of the results.  I have discussed with neurology on-call who is seen the patient.  Will admit to the medicine service  CRITICAL CARE Performed by: Leota Jacobsen Total critical care time: 45 minutes Critical care time was exclusive of separately billable procedures and treating other patients. Critical care was necessary to treat or prevent imminent or life-threatening deterioration. Critical care was time spent personally by me on the following activities: development of treatment plan with patient and/or surrogate as well as nursing, discussions with consultants, evaluation of patient's response to treatment, examination of patient, obtaining history from patient or surrogate, ordering and performing  treatments and interventions, ordering and review of laboratory studies, ordering and review of radiographic studies, pulse oximetry and re-evaluation of patient's condition.         Final Clinical Impression(s) / ED Diagnoses Final diagnoses:  None    Rx / DC Orders ED Discharge Orders     None         Lacretia Leigh, MD 07/28/22 2122

## 2022-07-28 NOTE — ED Triage Notes (Signed)
Per [facility] pt coming from home- recently in hospital and had brain surgery 2 weeks ago. Over the past three days patient has been having gait issues, slurred speech, and left sided facial droop.

## 2022-07-29 ENCOUNTER — Ambulatory Visit: Payer: BC Managed Care – PPO | Admitting: Physical Therapy

## 2022-07-29 ENCOUNTER — Observation Stay (HOSPITAL_COMMUNITY): Payer: BC Managed Care – PPO

## 2022-07-29 DIAGNOSIS — Z8249 Family history of ischemic heart disease and other diseases of the circulatory system: Secondary | ICD-10-CM | POA: Diagnosis not present

## 2022-07-29 DIAGNOSIS — E43 Unspecified severe protein-calorie malnutrition: Secondary | ICD-10-CM | POA: Diagnosis present

## 2022-07-29 DIAGNOSIS — E785 Hyperlipidemia, unspecified: Secondary | ICD-10-CM | POA: Diagnosis not present

## 2022-07-29 DIAGNOSIS — I2510 Atherosclerotic heart disease of native coronary artery without angina pectoris: Secondary | ICD-10-CM | POA: Diagnosis not present

## 2022-07-29 DIAGNOSIS — Z7952 Long term (current) use of systemic steroids: Secondary | ICD-10-CM | POA: Diagnosis not present

## 2022-07-29 DIAGNOSIS — Z981 Arthrodesis status: Secondary | ICD-10-CM | POA: Diagnosis not present

## 2022-07-29 DIAGNOSIS — G96198 Other disorders of meninges, not elsewhere classified: Secondary | ICD-10-CM | POA: Diagnosis present

## 2022-07-29 DIAGNOSIS — G4733 Obstructive sleep apnea (adult) (pediatric): Secondary | ICD-10-CM | POA: Diagnosis not present

## 2022-07-29 DIAGNOSIS — Z96651 Presence of right artificial knee joint: Secondary | ICD-10-CM | POA: Diagnosis not present

## 2022-07-29 DIAGNOSIS — R471 Dysarthria and anarthria: Secondary | ICD-10-CM | POA: Diagnosis not present

## 2022-07-29 DIAGNOSIS — E7800 Pure hypercholesterolemia, unspecified: Secondary | ICD-10-CM | POA: Diagnosis not present

## 2022-07-29 DIAGNOSIS — I110 Hypertensive heart disease with heart failure: Secondary | ICD-10-CM | POA: Diagnosis present

## 2022-07-29 DIAGNOSIS — I63549 Cerebral infarction due to unspecified occlusion or stenosis of unspecified cerebellar artery: Secondary | ICD-10-CM | POA: Diagnosis not present

## 2022-07-29 DIAGNOSIS — M0520 Rheumatoid vasculitis with rheumatoid arthritis of unspecified site: Secondary | ICD-10-CM | POA: Diagnosis not present

## 2022-07-29 DIAGNOSIS — I959 Hypotension, unspecified: Secondary | ICD-10-CM | POA: Diagnosis not present

## 2022-07-29 DIAGNOSIS — F4320 Adjustment disorder, unspecified: Secondary | ICD-10-CM | POA: Diagnosis not present

## 2022-07-29 DIAGNOSIS — Z7982 Long term (current) use of aspirin: Secondary | ICD-10-CM | POA: Diagnosis not present

## 2022-07-29 DIAGNOSIS — I429 Cardiomyopathy, unspecified: Secondary | ICD-10-CM | POA: Diagnosis not present

## 2022-07-29 DIAGNOSIS — R1312 Dysphagia, oropharyngeal phase: Secondary | ICD-10-CM | POA: Diagnosis not present

## 2022-07-29 DIAGNOSIS — I69993 Ataxia following unspecified cerebrovascular disease: Secondary | ICD-10-CM | POA: Diagnosis not present

## 2022-07-29 DIAGNOSIS — E663 Overweight: Secondary | ICD-10-CM | POA: Diagnosis present

## 2022-07-29 DIAGNOSIS — R4701 Aphasia: Secondary | ICD-10-CM | POA: Diagnosis not present

## 2022-07-29 DIAGNOSIS — I5022 Chronic systolic (congestive) heart failure: Secondary | ICD-10-CM | POA: Diagnosis not present

## 2022-07-29 DIAGNOSIS — R1310 Dysphagia, unspecified: Secondary | ICD-10-CM | POA: Diagnosis not present

## 2022-07-29 DIAGNOSIS — G936 Cerebral edema: Secondary | ICD-10-CM | POA: Diagnosis present

## 2022-07-29 DIAGNOSIS — I639 Cerebral infarction, unspecified: Secondary | ICD-10-CM | POA: Diagnosis not present

## 2022-07-29 DIAGNOSIS — R270 Ataxia, unspecified: Secondary | ICD-10-CM | POA: Diagnosis not present

## 2022-07-29 DIAGNOSIS — Z6826 Body mass index (BMI) 26.0-26.9, adult: Secondary | ICD-10-CM | POA: Diagnosis not present

## 2022-07-29 DIAGNOSIS — I63541 Cerebral infarction due to unspecified occlusion or stenosis of right cerebellar artery: Secondary | ICD-10-CM | POA: Diagnosis present

## 2022-07-29 DIAGNOSIS — I5042 Chronic combined systolic (congestive) and diastolic (congestive) heart failure: Secondary | ICD-10-CM | POA: Diagnosis present

## 2022-07-29 DIAGNOSIS — M0609 Rheumatoid arthritis without rheumatoid factor, multiple sites: Secondary | ICD-10-CM | POA: Diagnosis not present

## 2022-07-29 DIAGNOSIS — Z87891 Personal history of nicotine dependence: Secondary | ICD-10-CM | POA: Diagnosis not present

## 2022-07-29 DIAGNOSIS — G4700 Insomnia, unspecified: Secondary | ICD-10-CM | POA: Diagnosis not present

## 2022-07-29 DIAGNOSIS — T380X5A Adverse effect of glucocorticoids and synthetic analogues, initial encounter: Secondary | ICD-10-CM | POA: Diagnosis not present

## 2022-07-29 DIAGNOSIS — I6389 Other cerebral infarction: Secondary | ICD-10-CM

## 2022-07-29 DIAGNOSIS — R7303 Prediabetes: Secondary | ICD-10-CM | POA: Diagnosis not present

## 2022-07-29 DIAGNOSIS — F32A Depression, unspecified: Secondary | ICD-10-CM | POA: Diagnosis present

## 2022-07-29 DIAGNOSIS — G4489 Other headache syndrome: Secondary | ICD-10-CM | POA: Diagnosis not present

## 2022-07-29 DIAGNOSIS — G910 Communicating hydrocephalus: Secondary | ICD-10-CM | POA: Diagnosis not present

## 2022-07-29 DIAGNOSIS — Z982 Presence of cerebrospinal fluid drainage device: Secondary | ICD-10-CM | POA: Diagnosis not present

## 2022-07-29 DIAGNOSIS — M069 Rheumatoid arthritis, unspecified: Secondary | ICD-10-CM | POA: Diagnosis not present

## 2022-07-29 DIAGNOSIS — Z79899 Other long term (current) drug therapy: Secondary | ICD-10-CM | POA: Diagnosis not present

## 2022-07-29 DIAGNOSIS — I25810 Atherosclerosis of coronary artery bypass graft(s) without angina pectoris: Secondary | ICD-10-CM | POA: Diagnosis not present

## 2022-07-29 DIAGNOSIS — I63233 Cerebral infarction due to unspecified occlusion or stenosis of bilateral carotid arteries: Secondary | ICD-10-CM | POA: Diagnosis not present

## 2022-07-29 LAB — BASIC METABOLIC PANEL
Anion gap: 12 (ref 5–15)
BUN: 10 mg/dL (ref 6–20)
CO2: 27 mmol/L (ref 22–32)
Calcium: 9.2 mg/dL (ref 8.9–10.3)
Chloride: 102 mmol/L (ref 98–111)
Creatinine, Ser: 0.92 mg/dL (ref 0.61–1.24)
GFR, Estimated: >60 mL/min (ref >60)
Glucose, Bld: 90 mg/dL (ref 70–99)
Potassium: 3.9 mmol/L (ref 3.5–5.1)
Sodium: 141 mmol/L (ref 135–145)

## 2022-07-29 LAB — ECHOCARDIOGRAM COMPLETE
AR max vel: 2.01 cm2
AV Area VTI: 1.97 cm2
AV Area mean vel: 1.93 cm2
AV Mean grad: 7 mmHg
AV Peak grad: 12.7 mmHg
Ao pk vel: 1.79 m/s
Area-P 1/2: 3.77 cm2
Height: 73 in
S' Lateral: 3.2 cm
Weight: 3184 oz

## 2022-07-29 LAB — MAGNESIUM: Magnesium: 1.6 mg/dL — ABNORMAL LOW (ref 1.7–2.4)

## 2022-07-29 LAB — LIPID PANEL
Cholesterol: 118 mg/dL (ref 0–200)
HDL: 42 mg/dL (ref >40)
LDL Cholesterol: 57 mg/dL (ref 0–99)
Total CHOL/HDL Ratio: 2.8 RATIO
Triglycerides: 97 mg/dL (ref <150)
VLDL: 19 mg/dL (ref 0–40)

## 2022-07-29 LAB — HIV ANTIBODY (ROUTINE TESTING W REFLEX): HIV Screen 4th Generation wRfx: NONREACTIVE

## 2022-07-29 MED ORDER — IOHEXOL 350 MG/ML SOLN
75.0000 mL | Freq: Once | INTRAVENOUS | Status: AC | PRN
  Administered 2022-07-29: 75 mL via INTRAVENOUS

## 2022-07-29 MED ORDER — MAGNESIUM SULFATE 2 GM/50ML IV SOLN
2.0000 g | Freq: Once | INTRAVENOUS | Status: AC
  Administered 2022-07-29: 2 g via INTRAVENOUS
  Filled 2022-07-29: qty 50

## 2022-07-29 MED ORDER — DEXAMETHASONE SODIUM PHOSPHATE 4 MG/ML IJ SOLN
4.0000 mg | Freq: Three times a day (TID) | INTRAMUSCULAR | Status: DC
  Administered 2022-07-29 – 2022-07-31 (×5): 4 mg via INTRAVENOUS
  Filled 2022-07-29 (×5): qty 1

## 2022-07-29 NOTE — Progress Notes (Signed)
Inpatient Rehab Admissions Coordinator Note:   Per PT recommendations patient was screened for CIR candidacy by Michel Santee, PT. At this time, pt appears to be a potential candidate for CIR. I will plcae an order for rehab consult for full assessment, per our protocol.  Please contact me any with questions.Shann Medal, PT, DPT [phone] 07/29/22 2:33 PM

## 2022-07-29 NOTE — Evaluation (Signed)
Physical Therapy Evaluation Patient Details Name: Christopher Yu MRN: [id number] DOB: 1960-07-22 Today's Date: 07/29/2022  History of Present Illness  62 y.o. male presents to [facility] hospital on 07/28/2022 with slurred speech, worsening gait and HA. Pt recently discharged 07/25/2022 after operative repair of pseudomeningiocele. MRI brain 3/25 demonstrates acute R cerebellar infarct. PMH: for CAD, R TKA, cardiomyopathy, chronic systolic heart failure, macular degeneration.  Clinical Impression  Pt presents to PT with deficits in functional mobility, gait, balance, endurance, strength, power, coordination, speech. Pt appears generally weak, with significantly impaired sitting and standing balance. Pt demonstrates a strong posterior lean initially in sitting, followed by a left lateral lean in standing. Pt benefits from physical assistance to ambulate for a limited distance at this time, with both lateral and posterior losses of balance. PT recommends frequent mobilization during this admission in an effort to improve mobility quality and to reduce falls risk. PT recommends high intensity inpatient PT services at the time of discharge.       Recommendations for follow up therapy are one component of a multi-disciplinary discharge planning process, led by the attending physician.  Recommendations may be updated based on patient status, additional functional criteria and insurance authorization.  Follow Up Recommendations       Assistance Recommended at Discharge Frequent or constant Supervision/Assistance  Patient can return home with the following  A lot of help with walking and/or transfers;A lot of help with bathing/dressing/bathroom;Assistance with cooking/housework;Direct supervision/assist for medications management;Direct supervision/assist for financial management;Assist for transportation;Help with stairs or ramp for entrance    Equipment Recommendations Wheelchair (measurements PT)   Recommendations for Other Services  Rehab consult    Functional Status Assessment Patient has had a recent decline in their functional status and demonstrates the ability to make significant improvements in function in a reasonable and predictable amount of time.     Precautions / Restrictions Precautions Precautions: Fall Restrictions Weight Bearing Restrictions: No      Mobility  Bed Mobility Overal bed mobility: Needs Assistance Bed Mobility: Supine to Sit, Sit to Supine     Supine to sit: Mod assist, HOB elevated Sit to supine: Min assist        Transfers Overall transfer level: Needs assistance Equipment used: Rolling walker (2 wheels) Transfers: Sit to/from Stand Sit to Stand: Min assist           General transfer comment: strong left lateral lean initially, requires verbal and visual cues to correct    Ambulation/Gait Ambulation/Gait assistance: Min assist Gait Distance (Feet): 4 Feet Assistive device: Rolling walker (2 wheels) Gait Pattern/deviations: Step-to pattern Gait velocity: reduced Gait velocity interpretation: <1.31 ft/sec, indicative of household ambulator   General Gait Details: slowed step-to gait, reduced step length bilaterally, increased lateral sway, posterior lean with backward steps when returning toward bed  Stairs            Wheelchair Mobility    Modified Rankin (Stroke Patients Only) Modified Rankin (Stroke Patients Only) Pre-Morbid Rankin Score: No symptoms Modified Rankin: Moderately severe disability     Balance Overall balance assessment: Needs assistance Sitting-balance support: Single extremity supported, Feet supported Sitting balance-Leahy Scale: Poor Sitting balance - Comments: posterior and rightward lean Postural control: Posterior lean, Right lateral lean Standing balance support: Bilateral upper extremity supported, Reliant on assistive device for balance Standing balance-Leahy Scale: Poor Standing  balance comment: leftward lean initially  Pertinent Vitals/Pain Pain Assessment Pain Assessment: Faces Faces Pain Scale: Hurts little more Pain Location: generalized Pain Descriptors / Indicators: Grimacing Pain Intervention(s): Monitored during session    Home Living Family/patient expects to be discharged to:: Private residence Living Arrangements: Spouse/significant other;Children Available Help at Discharge: Available PRN/intermittently (spouse works 7-12:30 typically) Type of Home: House Home Access: Stairs to enter Entrance Stairs-Rails: Right Entrance Stairs-Number of Steps: 3 Alternate Level Stairs-Number of Steps: flight Home Layout: Two level;Able to live on main level with bedroom/bathroom Home Equipment: Rolling Walker (2 wheels);Shower seat;Cane - single point;Grab bars - toilet      Prior Function Prior Level of Function : Needs assist             Mobility Comments: independent with mobility prior to initial surgery in december. Has been ambulating with RW since surgery, multiple falls. Was ambulating with RW and assistance from spouse at all times since recent discharge on 3/22. ADLs Comments: assistance with all ADLs since discharge last week. Independent prior to initial surgery in December     Hand Dominance   Dominant Hand: Right    Extremity/Trunk Assessment   Upper Extremity Assessment Upper Extremity Assessment: RUE deficits/detail;LUE deficits/detail RUE Deficits / Details: ROM WFL, strength grossly 4+/5 RUE Coordination: decreased gross motor (slowed finger to nose) LUE Deficits / Details: ROM WFL, strength grossly 4+/5 LUE Coordination: decreased gross motor (slowed finger to nose)    Lower Extremity Assessment Lower Extremity Assessment: Generalized weakness    Cervical / Trunk Assessment Cervical / Trunk Assessment: Other exceptions;Kyphotic Cervical / Trunk Exceptions: reduced cervical extension  since most recent surgery  Communication   Communication: Other (comment);Expressive difficulties (significant dysarthria)  Cognition Arousal/Alertness: Awake/alert Behavior During Therapy: WFL for tasks assessed/performed Overall Cognitive Status: Impaired/Different from baseline Area of Impairment: Problem solving, Awareness                           Awareness: Emergent Problem Solving: Slow processing, Requires verbal cues          General Comments General comments (skin integrity, edema, etc.): VSS on RA with mobility, pt on 5L [place] upon PT arrival due to desaturation when sleeping    Exercises     Assessment/Plan    PT Assessment Patient needs continued PT services  PT Problem List Decreased strength;Decreased activity tolerance;Decreased balance;Decreased mobility;Decreased knowledge of use of DME;Decreased safety awareness;Decreased knowledge of precautions;Pain;Decreased coordination;Decreased cognition       PT Treatment Interventions DME instruction;Gait training;Stair training;Functional mobility training;Therapeutic activities;Therapeutic exercise;Balance training;Neuromuscular re-education;Patient/family education    PT Goals (Current goals can be found in the Care Plan section)  Acute Rehab PT Goals Patient Stated Goal: to return to independence in mobility PT Goal Formulation: With patient/family Time For Goal Achievement: 08/12/22 Potential to Achieve Goals: Fair    Frequency Min 4X/week     Co-evaluation               AM-PAC PT "6 Clicks" Mobility  Outcome Measure Help needed turning from your back to your side while in a flat bed without using bedrails?: A Little Help needed moving from lying on your back to sitting on the side of a flat bed without using bedrails?: A Lot Help needed moving to and from a bed to a chair (including a wheelchair)?: A Little Help needed standing up from a chair using your arms (e.g., wheelchair or bedside  chair)?: A Little Help needed to walk in hospital room?:  Total Help needed climbing 3-5 steps with a railing? : Total 6 Click Score: 13    End of Session Equipment Utilized During Treatment: Gait belt Activity Tolerance: Patient limited by fatigue Patient left: in bed;with call bell/phone within reach;with family/visitor present Nurse Communication: Mobility status PT Visit Diagnosis: Other abnormalities of gait and mobility (R26.89);Unsteadiness on feet (R26.81);Other symptoms and signs involving the nervous system (R29.898)    Time: 1202-1230 PT Time Calculation (min) (ACUTE ONLY): 28 min   Charges:   PT Evaluation $PT Eval Moderate Complexity: [place], PT, DPT Acute Rehabilitation Office [phone]   Zenaida Niece 07/29/2022, 1:49 PM

## 2022-07-29 NOTE — Hospital Course (Signed)
Patient is a 62 year old with past medical history of rheumatoid arthritis, CAD, OSA with CPAP intolerance, systolic/diastolic heart failure with ejection fraction of 40 to 45%, depression and recent repair of pseudomeningocele on 3/15 (discharged on 3/22) who presented to the emergency department on 3/25 dysarthria and gait difficulty.    Admitted to hospitalist service with neurology and neurosurgery consult.  MRI brain on admission noted improvement in small pseudomeningocele and no significant hemorrhage in the surgical cavity.  There is some right cerebellar flair noted which may be affecting patient's language.  Neurosurgery felt no need for intervention, but would benefit from steroids and patient started on dexamethasone.  Patient seen by physical and Occupational Therapy who recommended inpatient rehab of which consult is pending.  Patient seen by speech therapy put him on dysphagia 2 diet with nectar thick liquids.

## 2022-07-29 NOTE — Evaluation (Signed)
Occupational Therapy Evaluation Patient Details Name: Christopher Yu MRN: [id number] DOB: [birth year] Today's Date: 07/29/2022   History of Present Illness 62 y.o. male presents to [facility] hospital on 07/28/2022 with slurred speech, worsening gait and HA. Pt recently discharged 07/25/2022 after operative repair of pseudomeningiocele. MRI brain 3/25 demonstrates acute R cerebellar infarct. PMH: for CAD, R TKA, cardiomyopathy, chronic systolic heart failure, macular degeneration.   Clinical Impression   Pt admitted for above diagnosis. PTA, patient lived with spouse and has been receiving assistance with bADLs and completing functional ambulation Mod I with RW. Patient currently needing Min A for bed mobility and Min guard assist for sit>stand at bedside and lateral stepping. Patient not buckling but slow to take steps at bedside. Patient assessed for vision and coordination, moderate deficits with RUE/LUE coordination. RUE undershoots more than L but LUE not as coordinated with rapid alt movements. Pt would continue to benefit from continued acute skilled OT services to address above deficits and help transition to next level of care. Pt demos ability to tolerate 3+ hours of intensive therapy and reach Mod I functional performance, would benefit with intensive 3 hour therapy program to maximize functional independence.     Recommendations for follow up therapy are one component of a multi-disciplinary discharge planning process, led by the attending physician.  Recommendations may be updated based on patient status, additional functional criteria and insurance authorization.   Assistance Recommended at Discharge Frequent or constant Supervision/Assistance  Patient can return home with the following Assistance with cooking/housework;Help with stairs or ramp for entrance;Assist for transportation;A lot of help with bathing/dressing/bathroom;A little help with walking and/or transfers     Functional Status Assessment  Patient has had a recent decline in their functional status and demonstrates the ability to make significant improvements in function in a reasonable and predictable amount of time.  Equipment Recommendations  None recommended by OT    Recommendations for Other Services       Precautions / Restrictions Precautions Precautions: Fall Restrictions Weight Bearing Restrictions: No      Mobility Bed Mobility Overal bed mobility: Needs Assistance Bed Mobility: Supine to Sit, Sit to Supine     Supine to sit: Min assist Sit to supine: Min guard        Transfers Overall transfer level: Needs assistance Equipment used: Rolling walker (2 wheels) Transfers: Sit to/from Stand Sit to Stand: Min guard (increased time)           General transfer comment: Lateral side steps at bedside      Balance Overall balance assessment: Needs assistance Sitting-balance support: Single extremity supported, Feet supported Sitting balance-Leahy Scale: Fair Sitting balance - Comments: L lean, able to correct and return to midline with visual cues Postural control: Left lateral lean Standing balance support: Bilateral upper extremity supported, Reliant on assistive device for balance Standing balance-Leahy Scale: Poor Standing balance comment: Reliant on RW for support                           ADL either performed or assessed with clinical judgement   ADL Overall ADL's : Needs assistance/impaired Eating/Feeding: Independent;Sitting   Grooming: Sitting;Min guard   Upper Body Bathing: Min guard;Sitting Upper Body Bathing Details (indicate cue type and reason): Pt spouse reports sponge bathing recently PTA Lower Body Bathing: Sitting/lateral leans;Moderate assistance Lower Body Bathing Details (indicate cue type and reason): Pt spouse reports sponge bathing recently PTA Upper Body Dressing : Min guard;Sitting  Lower Body Dressing: Moderate  assistance;Sitting/lateral leans   Toilet Transfer: Rolling walker (2 wheels);Ambulation;Moderate assistance   Toileting- Clothing Manipulation and Hygiene: Moderate assistance;Sit to/from stand   Tub/ Banker: Moderate assistance;Rolling walker (2 wheels);Ambulation   Functional mobility during ADLs: Rolling walker (2 wheels);Min guard General ADL Comments: Lateral stepping at bedside     Vision Baseline Vision/History: 1 Wears glasses;6 Macular Degeneration Patient Visual Report: No change from baseline (Pt reports no visual changes PTA since December admission) Vision Assessment?: Yes Eye Alignment: Within Functional Limits Ocular Range of Motion: Within Functional Limits Tracking/Visual Pursuits: Decreased smoothness of horizontal tracking;Decreased smoothness of vertical tracking;Decreased smoothness of eye movement to RIGHT inferior field;Decreased smoothness of eye movement to LEFT inferior field;Decreased smoothness of eye movement to RIGHT superior field;Decreased smoothness of eye movement to LEFT superior field Diplopia Assessment:  (Pt does not report any double vision) Depth Perception: Undershoots Additional Comments: R undershoots more than L     Perception     Praxis      Pertinent Vitals/Pain Pain Assessment Pain Location: Headache     Hand Dominance Right   Extremity/Trunk Assessment Upper Extremity Assessment Upper Extremity Assessment: RUE deficits/detail;LUE deficits/detail RUE Deficits / Details: ROM WFL, Dysmetria present RUE Coordination: decreased gross motor (undershoots finger to nose) LUE Deficits / Details: ROM WFL, Dysmetria present LUE Coordination: decreased gross motor (undershoots finger to nose)   Lower Extremity Assessment Lower Extremity Assessment: Generalized weakness   Cervical / Trunk Assessment Cervical / Trunk Assessment: Kyphotic   Communication Communication Communication: Other (comment);Expressive difficulties  (significant dysarthria)   Cognition Arousal/Alertness: Awake/alert Behavior During Therapy: WFL for tasks assessed/performed   Area of Impairment: Awareness, Problem solving                           Awareness: Anticipatory Problem Solving: Slow processing       General Comments  VSS on RA, Sp02 at 91% at end of OT session, returned to 2L 02 and Sp02 at 95%    Exercises     Shoulder Instructions      Home Living Family/patient expects to be discharged to:: Private residence Living Arrangements: Spouse/significant other;Children Available Help at Discharge: Available PRN/intermittently (spouse works 7-12:30 typically) Type of Home: House Home Access: Stairs to enter Technical brewer of Steps: 3 Entrance Stairs-Rails: Right Home Layout: Two level;Able to live on main level with bedroom/bathroom Alternate Level Stairs-Number of Steps: flight   Bathroom Shower/Tub: Hospital doctor Toilet: Handicapped height Bathroom Accessibility: Yes   Home Equipment: Conservation officer, nature (2 wheels);Shower seat;Cane - single point;Grab bars - toilet          Prior Functioning/Environment Prior Level of Function : Needs assist             Mobility Comments: independent with mobility prior to initial surgery in december. Has been ambulating with RW since surgery, multiple falls. Was ambulating with RW and assistance from spouse at all times since recent discharge on 3/22. ADLs Comments: assistance with all ADLs since discharge last week. Independent prior to initial surgery in December        OT Problem List: Decreased range of motion;Impaired balance (sitting and/or standing);Decreased strength;Decreased coordination      OT Treatment/Interventions: Self-care/ADL training;Patient/family education;Balance training;Therapeutic activities;DME and/or AE instruction;Therapeutic exercise    OT Goals(Current goals can be found in the care plan section) Acute  Rehab OT Goals Patient Stated Goal: To return home OT Goal Formulation: With  patient/family Time For Goal Achievement: 08/11/22 Potential to Achieve Goals: Good ADL Goals Pt Will Perform Grooming: standing;with min guard assist (standing at sink) Pt Will Perform Lower Body Bathing: with min guard assist;sitting/lateral leans Pt Will Transfer to Toilet: with min guard assist;ambulating  OT Frequency: Min 2X/week    Co-evaluation              AM-PAC OT "6 Clicks" Daily Activity     Outcome Measure Help from another person eating meals?: None Help from another person taking care of personal grooming?: A Little Help from another person toileting, which includes using toliet, bedpan, or urinal?: A Lot Help from another person bathing (including washing, rinsing, drying)?: A Lot Help from another person to put on and taking off regular upper body clothing?: A Little Help from another person to put on and taking off regular lower body clothing?: A Lot 6 Click Score: 16   End of Session Equipment Utilized During Treatment: Gait belt;Rolling walker (2 wheels) Nurse Communication: Mobility status  Activity Tolerance: Patient tolerated treatment well Patient left: in bed;with family/visitor present;with call bell/phone within reach  OT Visit Diagnosis: Unsteadiness on feet (R26.81);Other abnormalities of gait and mobility (R26.89);Other symptoms and signs involving the nervous system (R29.898)                Time: [id number] OT Time Calculation (min): 22 min Charges:  OT General Charges $OT Visit: 1 Visit OT Evaluation $OT Eval Moderate Complexity: 1 Mod  07/29/2022  AB, OTR/L  Acute Rehabilitation Services  Office: [place] 07/29/2022, 4:40 PM

## 2022-07-29 NOTE — ED Notes (Signed)
ED TO INPATIENT HANDOFF REPORT  ED Nurse Name and Phone #:  Eloisa Northern [phone]  S Name/Age/Gender Christopher Yu 62 y.o. male Room/Bed: 040C/040C  Code Status   Code Status: Full Code  Home/SNF/Other Home Patient oriented to: situation Is this baseline? Yes   Triage Complete: Triage complete  Chief Complaint Ischemic cerebrovascular accident (CVA) ([place]) [I63.9] Acute CVA (cerebrovascular accident) [facility]) [I63.9]  Triage Note Per [facility] pt coming from home- recently in hospital and had brain surgery 2 weeks ago. Over the past three days patient has been having gait issues, slurred speech, and left sided facial droop.    Allergies No Known Allergies  Level of Care/Admitting Diagnosis ED Disposition     ED Disposition  Admit   Condition  --   Comment  Hospital Area: [facility] [[id number]]  Level of Care: Telemetry Medical [104]  May admit patient to [facility] or [facility] if equivalent level of care is available:: No  Covid Evaluation: Asymptomatic - no recent exposure (last 10 days) testing not required  Diagnosis: Acute CVA (cerebrovascular accident) [facility]) [id number]  Admitting Physician: Lavina Hamman [id number]  Attending Physician: Lavina Hamman [id number]  Certification:: I certify there are rare and unusual circumstances requiring inpatient admission          B Medical/Surgery History Past Medical History:  Diagnosis Date   Anxiety    Arthritis, rheumatoid ([place])    dx 1988   CAD (coronary artery disease), native coronary artery    Mild LAD disease with calcification noted at cath 12//27/16    Cardiomyopathy ([place])    Cardiomyopathy ([place])    Chronic systolic heart failure ([place]) 05/01/2015   Depression    Deviated nasal septum 06/25/2011   Headache    High cholesterol    Hyperlipidemia    Impingement syndrome of left shoulder 12/22/2017   Macular degeneration    Rheumatoid aortitis    Sleep apnea     does not use cpap - UNABLE TO TOLERATE MASK   Sleep apnea in adult    deviated septum repaired, most recent sleep study was negative   Status post total right knee replacement 06/01/2015   Past Surgical History:  Procedure Laterality Date   ANKLE FUSION Right 05/05/2012   related to his arthritis   ANKLE FUSION Left 05/25/2013   related to his arthritis   ANKLE SURGERY Bilateral    APPLICATION OF CRANIAL NAVIGATION N/A 04/11/2022   Procedure: APPLICATION OF CRANIAL NAVIGATION;  Surgeon: Consuella Lose, MD;  Location: [facility];  Service: Neurosurgery;  Laterality: N/A;   CARDIAC CATHETERIZATION N/A 05/01/2015   Procedure: Left Heart Cath and Coronary Angiography;  Surgeon: Belva Crome, MD;  Location: [facility];  Service: Cardiovascular;  Laterality: N/A;   CRANIOTOMY N/A 04/11/2022   Procedure: STEREOTACTIC SUBOCCIPITAL CRANIOTOMY FOR RESECTION OF ARTERIO-VENOUS MALFORMATION;  Surgeon: Consuella Lose, MD;  Location: [facility];  Service: Neurosurgery;  Laterality: N/A;   FRACTURE SURGERY     left femur fracture x 3   IR ANGIO EXTERNAL CAROTID SEL EXT CAROTID BILAT MOD SED  01/28/2022   IR ANGIO INTRA EXTRACRAN SEL INTERNAL CAROTID BILAT MOD SED  01/28/2022   IR ANGIO INTRA EXTRACRAN SEL INTERNAL CAROTID BILAT MOD SED  04/16/2022   IR ANGIO VERTEBRAL SEL VERTEBRAL BILAT MOD SED  01/28/2022   IR ANGIO VERTEBRAL SEL VERTEBRAL BILAT MOD SED  04/16/2022   KNEE ARTHROSCOPY     right x 4   LUMBAR  LAMINECTOMY/DECOMPRESSION MICRODISCECTOMY N/A 07/18/2022   Procedure: Repair of Pseudomeningiocele posterior;  Surgeon: Consuella Lose, MD;  Location: [facility];  Service: Neurosurgery;  Laterality: N/A;   NASAL SEPTOPLASTY W/ TURBINOPLASTY  06/25/2011   Procedure: NASAL SEPTOPLASTY WITH TURBINATE REDUCTION;  Surgeon: Jerrell Belfast, MD;  Location: [facility];  Service: ENT;  Laterality: Bilateral;   PLACEMENT OF LUMBAR DRAIN N/A 07/18/2022   Procedure: PLACEMENT OF LUMBAR DRAIN;  Surgeon:  Consuella Lose, MD;  Location: [facility];  Service: Neurosurgery;  Laterality: N/A;   TONSILLECTOMY     TOTAL KNEE ARTHROPLASTY Right 06/01/2015   Procedure: RIGHT TOTAL KNEE ARTHROPLASTY;  Surgeon: Mcarthur Rossetti, MD;  Location: [facility];  Service: Orthopedics;  Laterality: Right;  Block+general     A IV Location/Drains/Wounds Patient Lines/Drains/Airways Status     Active Line/Drains/Airways     Name Placement date Placement time Site Days   Peripheral IV 07/28/22 18 G Right Antecubital 07/28/22  1635  Antecubital  1            Intake/Output Last 24 hours  Intake/Output Summary (Last 24 hours) at 07/29/2022 1435 Last data filed at 07/29/2022 1140 Gross per 24 hour  Intake 770.83 ml  Output --  Net 770.83 ml    Labs/Imaging Results for orders placed or performed during the hospital encounter of 07/28/22 (from the past 48 hour(s))  CBC with Differential/Platelet     Status: Abnormal   Collection Time: 07/28/22  4:44 PM  Result Value Ref Range   WBC 10.9 (H) 4.0 - 10.5 K/uL   RBC 5.00 4.22 - 5.81 MIL/uL   Hemoglobin 11.5 (L) 13.0 - 17.0 g/dL   HCT 38.0 (L) 39.0 - 52.0 %   MCV 76.0 (L) 80.0 - 100.0 fL   MCH 23.0 (L) 26.0 - 34.0 pg   MCHC 30.3 30.0 - 36.0 g/dL   RDW 19.1 (H) 11.5 - 15.5 %   Platelets 240 150 - 400 K/uL    Comment: REPEATED TO VERIFY   nRBC 0.0 0.0 - 0.2 %   Neutrophils Relative % 75 %   Neutro Abs 8.2 (H) 1.7 - 7.7 K/uL   Lymphocytes Relative 14 %   Lymphs Abs 1.5 0.7 - 4.0 K/uL   Monocytes Relative 4 %   Monocytes Absolute 0.5 0.1 - 1.0 K/uL   Eosinophils Relative 6 %   Eosinophils Absolute 0.6 (H) 0.0 - 0.5 K/uL   Basophils Relative 1 %   Basophils Absolute 0.1 0.0 - 0.1 K/uL   Immature Granulocytes 0 %   Abs Immature Granulocytes 0.04 0.00 - 0.07 K/uL    Comment: Performed at [facility], [address] [place], [place] [address]  Comprehensive metabolic panel     Status: Abnormal   Collection Time: 07/28/22  4:44 PM  Result  Value Ref Range   Sodium 139 135 - 145 mmol/L   Potassium 4.5 3.5 - 5.1 mmol/L   Chloride 100 98 - 111 mmol/L   CO2 27 22 - 32 mmol/L   Glucose, Bld 116 (H) 70 - 99 mg/dL    Comment: Glucose reference range applies only to samples taken after fasting for at least 8 hours.   BUN 11 6 - 20 mg/dL   Creatinine, Ser 0.92 0.61 - 1.24 mg/dL   Calcium 9.5 8.9 - 10.3 mg/dL   Total Protein 7.3 6.5 - 8.1 g/dL   Albumin 3.9 3.5 - 5.0 g/dL   AST 23 15 - 41 U/L   ALT 20  0 - 44 U/L   Alkaline Phosphatase 65 38 - 126 U/L   Total Bilirubin 0.4 0.3 - 1.2 mg/dL   GFR, Estimated >60 >60 mL/min    Comment: (NOTE) Calculated using the CKD-EPI Creatinine Equation (2021)    Anion gap 12 5 - 15    Comment: Performed at [facility] [address], [place], [place] [id number]  Basic metabolic panel     Status: None   Collection Time: 07/29/22  1:56 AM  Result Value Ref Range   Sodium 141 135 - 145 mmol/L   Potassium 3.9 3.5 - 5.1 mmol/L   Chloride 102 98 - 111 mmol/L   CO2 27 22 - 32 mmol/L   Glucose, Bld 90 70 - 99 mg/dL    Comment: Glucose reference range applies only to samples taken after fasting for at least 8 hours.   BUN 10 6 - 20 mg/dL   Creatinine, Ser 0.92 0.61 - 1.24 mg/dL   Calcium 9.2 8.9 - 10.3 mg/dL   GFR, Estimated >60 >60 mL/min    Comment: (NOTE) Calculated using the CKD-EPI Creatinine Equation (2021)    Anion gap 12 5 - 15    Comment: Performed at [facility] [address] [place], [place] [address]  HIV Antibody (routine testing w rflx)     Status: None   Collection Time: 07/29/22  2:00 AM  Result Value Ref Range   HIV Screen 4th Generation wRfx Non Reactive Non Reactive    Comment: Performed at [facility], [place] [address] [place], [place] [address]  Lipid panel     Status: None   Collection Time: 07/29/22  2:00 AM  Result Value Ref Range   Cholesterol 118 0 - 200 mg/dL   Triglycerides 97 <150 mg/dL   HDL 42 >40 mg/dL   Total CHOL/HDL Ratio 2.8  RATIO   VLDL 19 0 - 40 mg/dL   LDL Cholesterol 57 0 - 99 mg/dL    Comment:        Total Cholesterol/HDL:CHD Risk Coronary Heart Disease Risk Table                     Men   Women  1/2 Average Risk   3.4   3.3  Average Risk       5.0   4.4  2 X Average Risk   9.6   7.1  3 X Average Risk  23.4   11.0        Use the calculated Patient Ratio above and the CHD Risk Table to determine the patient's CHD Risk.        ATP III CLASSIFICATION (LDL):  <100     mg/dL   Optimal  100-129  mg/dL   Near or Above                    Optimal  130-159  mg/dL   Borderline  160-189  mg/dL   High  >190     mg/dL   Very High Performed at [facility] [address] [place], [place] [address]   Magnesium     Status: Abnormal   Collection Time: 07/29/22  2:00 AM  Result Value Ref Range   Magnesium 1.6 (L) 1.7 - 2.4 mg/dL    Comment: Performed at [facility] [address] [place], [place] [address]   CT ANGIO HEAD NECK W WO CM  Result Date:  07/29/2022 CLINICAL DATA:  Stroke, determine embolic source EXAM: CT ANGIOGRAPHY HEAD AND NECK TECHNIQUE: Multidetector CT imaging of the head and neck was performed using the standard protocol during bolus administration of intravenous contrast. Multiplanar CT image reconstructions and MIPs were obtained to evaluate the vascular anatomy. Carotid stenosis measurements (when applicable) are obtained utilizing NASCET criteria, using the distal internal carotid diameter as the denominator. RADIATION DOSE REDUCTION: This exam was performed according to the departmental dose-optimization program which includes automated exposure control, adjustment of the mA and/or kV according to patient size and/or use of iterative reconstruction technique. CONTRAST:  70mL OMNIPAQUE IOHEXOL 350 MG/ML SOLN COMPARISON:  Brain MRI from yesterday FINDINGS: CT HEAD FINDINGS Brain: Pseudomeningocele with recent repair and posterior fossa gas along the upper cerebellum. Infarct by  MRIs difficult to separate from chronic low-density in the right more than left cerebral hemisphere. Unchanged collection with mass effect asymmetrically to the left cerebral hemisphere. No hydrocephalus or acute hemorrhage. Vascular: See below Skull: Remote suboccipital craniectomy. Sinuses/Orbits: Negative Review of the MIP images confirms the above findings CTA NECK FINDINGS Aortic arch: Atheromatous plaque with 3 vessel branching. Right carotid system: Mild atheromatous plaque at the bifurcation. No ulceration or significant stenosis Left carotid system: Mild atheromatous plaque at the bifurcation. No ulceration or significant stenosis. Vertebral arteries: No proximal subclavian stenosis. Mild atheromatous plaque at the right vertebral origin. No beading or ulceration. Extrinsic narrowing of the left vertebral artery at C5-6 due to facet spurring from behind, narrowing measuring 60% on sagittal reformats. Skeleton: Ordinary cervical spine degeneration. Other neck: No acute finding. Known dissecting fluid collection spanning from the suboccipital craniectomy inferiorly into the neck. Upper chest: Clear apical lungs. Review of the MIP images confirms the above findings CTA HEAD FINDINGS Anterior circulation: Mild calcified plaque along the carotid siphons. No stenosis, branch occlusion, beading, or aneurysm Posterior circulation: The vertebral and basilar arteries are smoothly contoured and widely patent. Symmetrically enhancing superior cerebellar arteries without visible occlusion or irregularity. Symmetric and smoothly contoured posterior cerebral arteries. Venous sinuses: Unremarkable for arterial timing. Anatomic variants: None significant Review of the MIP images confirms the above findings IMPRESSION: 1. No vascular lesion seen underlying the cerebellar infarct. 2. Chronic 60% narrowing of the left vertebral artery due to extrinsic compression by C5-6 left facet osteophyte. 3. Limited atheromatous change  for age. Electronically Signed   By: Jorje Guild M.D.   On: 07/29/2022 05:06   MR Brain Wo Contrast (neuro protocol)  Result Date: 07/28/2022 CLINICAL DATA:  Neuro deficit, acute, stroke suspected. EXAM: MRI HEAD WITHOUT CONTRAST TECHNIQUE: Multiplanar, multiecho pulse sequences of the brain and surrounding structures were obtained without intravenous contrast. COMPARISON:  Head CT 07/28/2022.  MRI brain 07/05/2022. FINDINGS: Brain: New small focus of acute infarction in the right superior cerebellar hemisphere with surrounding area of increased T2 signal and cystic change, which is new since the prior MRI dated 07/05/2022 and suspicious for now chronic infarct. Postoperative changes of prior left posterior fossa resection. Redemonstrated foci of gas within the resection cavity, fourth ventricle and extra-axial spaces, likely secondary to recent pseudomeningocele repair and/or lumbar drain placement/removal. No acute intracranial hemorrhage.  No hydrocephalus. Vascular: Normal flow voids. Skull and upper cervical spine: Suboccipital craniectomy. Sinuses/Orbits: Trace fluid in the right sphenoid sinus. Other: Decreased size of the fluid collection in the dorsal soft tissues overlying the suboccipital craniectomy. IMPRESSION: 1. New small focus of acute infarction in the right superior cerebellar hemisphere with surrounding area of increased T2 signal and  cystic change, which is new since the prior MRI dated 07/05/2022 and suspicious for now chronic infarct. 2. Postoperative changes of prior left posterior fossa resection. Redemonstrated foci of gas within the resection cavity, fourth ventricle and extra-axial spaces, likely secondary to recent pseudomeningocele repair and/or lumbar drain placement/removal. 3. Decreased size of the fluid collection in the dorsal soft tissues overlying the suboccipital craniectomy. Electronically Signed   By: Emmit Alexanders M.D.   On: 07/28/2022 20:06   CT Head Wo  Contrast  Result Date: 07/28/2022 CLINICAL DATA:  Headache, neuro deficit. EXAM: CT HEAD WITHOUT CONTRAST TECHNIQUE: Contiguous axial images were obtained from the base of the skull through the vertex without intravenous contrast. RADIATION DOSE REDUCTION: This exam was performed according to the departmental dose-optimization program which includes automated exposure control, adjustment of the mA and/or kV according to patient size and/or use of iterative reconstruction technique. COMPARISON:  Head CT 04/29/2022.  MRI brain 07/05/2022. 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 New pneumocephalus in the resection cavity, likely secondary to recent pseudomeningocele repair and lumbar drain placement/removal. No acute intracranial hemorrhage, mass effect or midline shift. No hydrocephalus. Vascular: No hyperdense vessel or unexpected calcification. Skull: Suboccipital craniectomy. Sinuses/Orbits: Trace fluid in the sphenoid sinus. Orbits are unremarkable. Other: Partially imaged fluid collection in the dorsal soft tissues overlying the suboccipital craniectomy, likely residual or recurrent pseudomeningocele. IMPRESSION: 1. 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 New pneumocephalus in the resection cavity, likely secondary to recent pseudomeningocele repair and lumbar drain placement/removal. No acute intracranial hemorrhage. 2. Partially imaged fluid collection in the dorsal soft tissues overlying the suboccipital craniectomy, likely residual or recurrent pseudomeningocele. Electronically Signed   By: Emmit Alexanders M.D.   On: 07/28/2022 17:15    Pending Labs Unresulted Labs (From admission, onward)     Start     Ordered   08/04/22 0500  Creatinine, serum  (enoxaparin (LOVENOX)    CrCl >/= 30 ml/min)  Weekly,    R     Comments: while on enoxaparin therapy    07/28/22 2225   07/29/22 0500  Hemoglobin A1c  (Labs)  Tomorrow morning,   R       Comments: To assess prior glycemic control    07/28/22 2225            Vitals/Pain Today's Vitals   07/29/22 1000 07/29/22 1100 07/29/22 1200 07/29/22 1225  BP: (!) 140/93 (!) 124/100 122/79   Pulse: 92 95 81   Resp: 17 (!) 21 14   Temp:   98.1 F (36.7 C)   TempSrc:   Oral   SpO2: 96% 96% 98%   Weight:      Height:      PainSc:    0-No pain    Isolation Precautions No active isolations  Medications Medications  rosuvastatin (CRESTOR) tablet 20 mg (20 mg Oral Given 07/29/22 1058)  sertraline (ZOLOFT) tablet 50 mg (50 mg Oral Given 07/29/22 1058)  diazepam (VALIUM) tablet 5 mg (5 mg Oral Given 07/29/22 1058)  acetaminophen (TYLENOL) tablet 650 mg (650 mg Oral Given 07/29/22 1058)    Or  acetaminophen (TYLENOL) 160 MG/5ML solution 650 mg ( Per Tube See Alternative 07/29/22 1058)    Or  acetaminophen (TYLENOL) suppository 650 mg ( Rectal See Alternative 07/29/22 1058)  senna-docusate (Senokot-S) tablet 1 tablet (has no administration in time range)  enoxaparin (LOVENOX) injection 40  mg (40 mg Subcutaneous Given 07/28/22 2251)  aspirin EC tablet 81 mg (81 mg Oral Given 07/29/22 1058)  diazepam (VALIUM) tablet 5 mg (5 mg Oral Given 07/28/22 1911)  oxyCODONE-acetaminophen (PERCOCET/ROXICET) 5-325 MG per tablet 1 tablet (1 tablet Oral Given 07/28/22 2043)   stroke: early stages of recovery book ( Does not apply Given 07/29/22 1058)  iohexol (OMNIPAQUE) 350 MG/ML injection 75 mL (75 mLs Intravenous Contrast Given 07/29/22 0454)  magnesium sulfate IVPB 2 g 50 mL (0 g Intravenous Stopped 07/29/22 1140)    Mobility walks     Focused Assessments Neuro Assessment Handoff:  Swallow screen pass? Yes    NIH Stroke Scale  Dizziness Present: No Headache Present: No Interval: Other (Comment) Level of Consciousness (1a.)   : Alert, keenly responsive LOC  Questions (1b. )   : Answers both questions correctly LOC Commands (1c. )   : Performs both tasks correctly Best Gaze (2. )  : Normal Visual (3. )  : No visual loss Facial Palsy (4. )    : Minor paralysis Motor Arm, Left (5a. )   : No drift Motor Arm, Right (5b. ) : No drift Motor Leg, Left (6a. )  : No drift Motor Leg, Right (6b. ) : No drift Limb Ataxia (7. ): Absent Sensory (8. )  : Normal, no sensory loss Best Language (9. )  : Mild-to-moderate aphasia Dysarthria (10. ): Mild-to-moderate dysarthria, patient slurs at least some words and, at worst, can be understood with some difficulty Extinction/Inattention (11.)   : No Abnormality Complete NIHSS TOTAL: 3     Neuro Assessment:   Neuro Checks:   Shift assessment (07/28/22 2250)  Has TPA been given? No If patient is a Neuro Trauma and patient is going to OR before floor call report to [facility] nurse: [phone] or [phone]   R Recommendations: See Admitting Provider Note  Report given to:   Additional Notes:

## 2022-07-29 NOTE — Progress Notes (Signed)
Echocardiogram 2D Echocardiogram has been performed.  Ronny Flurry 07/29/2022, 2:56 PM

## 2022-07-29 NOTE — Progress Notes (Signed)
TR[facility] night cross cover note:   I was notified by RN that this patient has failed his nursing bedside swallow screen, coughing, choking, when attempting to drink.  Consequently, I have made the patient strict n.p.o. for now.  He has an existing order for SLP consultation, with request for formal swallow evaluation.     Babs Bertin, DO Hospitalist

## 2022-07-29 NOTE — ED Notes (Signed)
RN tried to assess pt and pt refuses to answer questions. RN cannot establish neuro baseline.

## 2022-07-29 NOTE — Consult Note (Signed)
NEUROLOGY CONSULTATION NOTE   Date of service: July 29, 2022 Patient Name: Christopher Yu MRN:  [id number] DOB:  [birth year] Reason for consult: "R cerebellar stroke on MRI Brain" Requesting Provider: Vianne Bulls, MD _ _ _   _ __   _ __ _ _  __ __   _ __   __ _  History of Present Illness  Christopher Yu is a 62 y.o. male with PMH significant for CAD, OSA, HLD, cerebellar AVM s/p posterior fossa craniotomy for resection in Dec 2023 and then recent operative repair of pseudomeningiocele and just discharged on Friday who presents with slurred speech, worsening gait and headache.  Timeline of his symptoms is somewhat unclear. Per wife, some gait imbalance after surgery in December with some slurring of speech. Was initially using walker and was intermittently able to switch to cane by late Jan/ early feb. Had some falls with cane and when he would stand up, he would sway left to right. Also had some slurring of speech back in December.  He underwent repair of meningocele on 07/18/22. He was in the hospital and still with gait imbalance, speech slurred but still able to understand. He was discharged Friday and worsened again on Saturday, she stopped sedating meds but still wobbly, speech now significantly worse, coughed up on food a little and eating mostly soups/ juices/puree. She was unable to get in touch with PCP or NSGY office and was finally able to get in touch with them today and was recommended that he come to hospital.  MRI Brain demonstrates a small area of acute R cerebellar infarct overlying possible chronic R superior cerebellar stroke.  No prior hx of strokes, no famil hx of strokes, former smoker, quit decade ago, does no drink EtOH.  Was on rituximab for RA, last dose was in oct 2023 and was hel for planned neurosurgery. Was taking aspirin at home and was held again for neurosurgery and just resumed on Saturday. Wife has also been cutting down pain  medications as he seems very loopy.  Eats mostly vegetables, little meat. Can feel when his bladder is full hold urine for a resonable amount of time and can completely empty his bladder when he urinates, bowel movements are voluntary, no saddle anesthesia.   ROS   Constitutional Denies weight loss, fever and chills.   HEENT Denies changes in vision and hearing.   Respiratory Denies SOB and cough.   CV Denies palpitations and CP   GI Denies abdominal pain, nausea, vomiting and diarrhea.   GU Denies dysuria and urinary frequency.   MSK Denies myalgia and joint pain.   Skin Denies rash and pruritus.   Neurological Denies headache and syncope.   Psychiatric Denies recent changes in mood. Denies anxiety and depression.    Past History   Past Medical History:  Diagnosis Date   Anxiety    Arthritis, rheumatoid ([place])    dx 1988   CAD (coronary artery disease), native coronary artery    Mild LAD disease with calcification noted at cath 12//27/16    Cardiomyopathy ([place])    Cardiomyopathy ([place])    Chronic systolic heart failure ([place]) 05/01/2015   Depression    Deviated nasal septum 06/25/2011   Headache    High cholesterol    Hyperlipidemia    Impingement syndrome of left shoulder 12/22/2017   Macular degeneration    Rheumatoid aortitis    Sleep apnea    does not use cpap - UNABLE TO TOLERATE  MASK   Sleep apnea in adult    deviated septum repaired, most recent sleep study was negative   Status post total right knee replacement 06/01/2015   Past Surgical History:  Procedure Laterality Date   ANKLE FUSION Right 05/05/2012   related to his arthritis   ANKLE FUSION Left 05/25/2013   related to his arthritis   ANKLE SURGERY Bilateral    APPLICATION OF CRANIAL NAVIGATION N/A 04/11/2022   Procedure: APPLICATION OF CRANIAL NAVIGATION;  Surgeon: Consuella Lose, MD;  Location: [facility];  Service: Neurosurgery;  Laterality: N/A;   CARDIAC CATHETERIZATION N/A 05/01/2015   Procedure:  Left Heart Cath and Coronary Angiography;  Surgeon: Belva Crome, MD;  Location: [facility];  Service: Cardiovascular;  Laterality: N/A;   CRANIOTOMY N/A 04/11/2022   Procedure: STEREOTACTIC SUBOCCIPITAL CRANIOTOMY FOR RESECTION OF ARTERIO-VENOUS MALFORMATION;  Surgeon: Consuella Lose, MD;  Location: [facility];  Service: Neurosurgery;  Laterality: N/A;   FRACTURE SURGERY     left femur fracture x 3   IR ANGIO EXTERNAL CAROTID SEL EXT CAROTID BILAT MOD SED  01/28/2022   IR ANGIO INTRA EXTRACRAN SEL INTERNAL CAROTID BILAT MOD SED  01/28/2022   IR ANGIO INTRA EXTRACRAN SEL INTERNAL CAROTID BILAT MOD SED  04/16/2022   IR ANGIO VERTEBRAL SEL VERTEBRAL BILAT MOD SED  01/28/2022   IR ANGIO VERTEBRAL SEL VERTEBRAL BILAT MOD SED  04/16/2022   KNEE ARTHROSCOPY     right x 4   LUMBAR LAMINECTOMY/DECOMPRESSION MICRODISCECTOMY N/A 07/18/2022   Procedure: Repair of Pseudomeningiocele posterior;  Surgeon: Consuella Lose, MD;  Location: [facility];  Service: Neurosurgery;  Laterality: N/A;   NASAL SEPTOPLASTY W/ TURBINOPLASTY  06/25/2011   Procedure: NASAL SEPTOPLASTY WITH TURBINATE REDUCTION;  Surgeon: Jerrell Belfast, MD;  Location: [facility];  Service: ENT;  Laterality: Bilateral;   PLACEMENT OF LUMBAR DRAIN N/A 07/18/2022   Procedure: PLACEMENT OF LUMBAR DRAIN;  Surgeon: Consuella Lose, MD;  Location: [facility];  Service: Neurosurgery;  Laterality: N/A;   TONSILLECTOMY     TOTAL KNEE ARTHROPLASTY Right 06/01/2015   Procedure: RIGHT TOTAL KNEE ARTHROPLASTY;  Surgeon: Mcarthur Rossetti, MD;  Location: [facility];  Service: Orthopedics;  Laterality: Right;  Block+general   Family History  Problem Relation Age of Onset   Lupus Sister    Social History   Socioeconomic History   Marital status: Married    Spouse name: Christopher Yu   Number of children: 4   Years of education: Not on file   Highest education level: Not on file  Occupational History   Not on file  Tobacco Use   Smoking  status: Former    Packs/day: 0.50    Years: 20.00    Additional pack years: 0.00    Total pack years: 10.00    Types: Cigarettes    Quit date: 05/24/2008    Years since quitting: 14.1   Smokeless tobacco: Never   Tobacco comments:    stopped smoking 8 yrs ago.  Vaping Use   Vaping Use: Never used  Substance and Sexual Activity   Alcohol use: Yes    Comment: rarely   Drug use: No   Sexual activity: Not on file  Other Topics Concern   Not on file  Social History Narrative   Not on file   Social Determinants of Health   Financial Resource Strain: Not on file  Food Insecurity: No Food Insecurity (07/18/2022)   Hunger Vital Sign    Worried About Running Out of  Food in the Last Year: Never true    [place] in the Last Year: Never true  Transportation Needs: No Transportation Needs (07/18/2022)   PRAPARE - Hydrologist (Medical): No    Lack of Transportation (Non-Medical): No  Physical Activity: Not on file  Stress: Not on file  Social Connections: Not on file   No Known Allergies  Medications  (Not in a hospital admission)    Vitals   Vitals:   07/28/22 2015 07/28/22 2021 07/28/22 2030 07/28/22 2230  BP: 113/67  110/68 109/82  Pulse: 89  83 86  Resp: 11  14 15   Temp:  98.7 F (37.1 C)    TempSrc:  Oral    SpO2: 94%  95% 96%  Weight:      Height:         Body mass index is 26.25 kg/m.  Physical Exam   General: Laying comfortably in bed; in no acute distress.  HENT: Normal oropharynx and mucosa. Normal external appearance of ears and nose.  Neck: Supple, no pain or tenderness  CV: No JVD. No peripheral edema.  Pulmonary: Symmetric Chest rise. Normal respiratory effort.  Abdomen: Soft to touch, non-tender.  Ext: No cyanosis, edema, or deformity  Skin: No rash. Normal palpation of skin.   Musculoskeletal: Normal digits and nails by inspection. No clubbing.   Neurologic Examination  Mental status/Cognition: Alert,  oriented to self, place, month and year, good attention.  Speech/language: significantly dysarthric speech, non fluent, comprehension intact, object naming intact, repetition intact. Cranial nerves:   CN II Pupils equal and reactive to light, no VF deficits    CN III,IV,VI EOM intact, no gaze preference or deviation, no nystagmus    CN V normal sensation in V1, V2, and V3 segments bilaterally    CN VII no asymmetry, no nasolabial fold flattening    CN VIII normal hearing to speech    CN IX & X normal palatal elevation, no uvular deviation    CN XI 5/5 head turn and 5/5 shoulder shrug bilaterally    CN XII midline tongue protrusion    Motor:  Muscle bulk: poor, tone normal, pronator drift none tremor none Mvmt Root Nerve  Muscle Right Left Comments  SA C5/6 Ax Deltoid 5 5   EF C5/6 Mc Biceps 5 5   EE C6/7/8 Rad Triceps 5 5   WF C6/7 Med FCR     WE C7/8 PIN ECU     F Ab C8/T1 U ADM/FDI 5 5   HF L1/2/3 Fem Illopsoas 4+ 4+   KE L2/3/4 Fem Quad 4+ 4+   DF L4/5 D Peron Tib Ant 5 5   PF S1/2 Tibial Grc/Sol 5 5    Reflexes:  Right Left Comments  Pectoralis      Biceps (C5/6) 2 2   Brachioradialis (C5/6) 2 2    Triceps (C6/7) 2 2    Patellar (L3/4) 3 3 Spread with cross adductors +   Achilles (S1)      Hoffman      Plantar down down   Jaw jerk    Sensation:  Light touch    Pin prick    Temperature    Vibration   Proprioception    Coordination/Complex Motor:  - Finger to Nose with some past pointing on the right - Heel to shin intact BL - Rapid alternating movement are slowed but intact BL - Gait: attempted but aborted 2/2 noted truncal  ataxia.  Labs   CBC:  Recent Labs  Lab 07/28/22 1644  WBC 10.9*  NEUTROABS 8.2*  HGB 11.5*  HCT 38.0*  MCV 76.0*  PLT [id number]    Basic Metabolic Panel:  Lab Results  Component Value Date   NA 139 07/28/2022   K 4.5 07/28/2022   CO2 27 07/28/2022   GLUCOSE 116 (H) 07/28/2022   BUN 11 07/28/2022   CREATININE 0.92 07/28/2022    CALCIUM 9.5 07/28/2022   GFRNONAA >60 07/28/2022   GFRAA >60 06/02/2015   Lipid Panel: No results found for: "LDLCALC" HgbA1c: No results found for: "HGBA1C" Urine Drug Screen: No results found for: "LABOPIA", "COCAINSCRNUR", "LABBENZ", "AMPHETMU", "THCU", "LABBARB"  Alcohol Level No results found for: "ETH"  CT Head without contrast(Personally reviewed): 1. 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 New pneumocephalus in the resection cavity, likely secondary to recent pseudomeningocele repair and lumbar drain placement/removal. No acute intracranial hemorrhage. 2. Partially imaged fluid collection in the dorsal soft tissues overlying the suboccipital craniectomy, likely residual or recurrent pseudomeningocele.  CT angio Head and Neck with contrast: pending  MRI Brain: 1. New small focus of acute infarction in the right superior cerebellar hemisphere with surrounding area of increased T2 signal and cystic change, which is new since the prior MRI dated 07/05/2022 and suspicious for now chronic infarct. 2. Postoperative changes of prior left posterior fossa resection. Redemonstrated foci of gas within the resection cavity, fourth ventricle and extra-axial spaces, likely secondary to recent pseudomeningocele repair and/or lumbar drain placement/removal. 3. Decreased size of the fluid collection in the dorsal soft tissues overlying the suboccipital craniectomy.  Impression   Christopher Yu is a 62 y.o. male with PMH significant for CAD, OSA, HLD, cerebellar AVM s/p posterior fossa craniotomy for resection in Dec 2023 and then recent operative repair of pseudomeningiocele and just discharged on Friday who presents with slurred speech, worsening gait. His neurologic examination is notable for truncal ataxia. MRI shows acute on likely chronic R superior cerebellar infarct.  Timeline  of is symptoms is somewhat unclear as symptoms were noted after surgery back in December and there seems to have been acute worsening more recently over the weekend. Risk of strokes is elevated in the periop period.  Recommendations  Acute on likely chronic R superior Cerebellar stroke: - Frequent Neuro checks per stroke unit protocol - Recommend brain imaging with MRI Brain without contrast - CT angio head and neck - Recommend obtaining TTE - Recommend obtaining Lipid panel with LDL - Please start statin if LDL > 70 - Recommend HbA1c to evaluate for diabetes and how well it is controlled. - Antithrombotic - Aspirin 81mg  daily if okay with neurosurgery. - Recommend DVT ppx - SBP goal - gradual normotension. - Recommend Telemetry monitoring for arrythmia - Recommend bedside swallow screen prior to PO intake. - Stroke education booklet - Recommend PT/OT/SLP consult - stroke team to follow.  ______________________________________________________________________   Thank you for the opportunity to take part in the care of this patient. If you have any further questions, please contact the neurology consultation attending.  Signed,  [place] Pager Number [phone] _ _ _   _ __   _ __ _ _  __ __   _ __   __ _

## 2022-07-29 NOTE — Consult Note (Signed)
CC: speech difficulty  HPI:     Patient is a 62 y.o. male with hx of L cerebellar AVM resected by Dr. Kathyrn Sheriff who developed pseudomeningocele for which he underwent repair and LD placement on 3/15.  He was discharged on 3/22.  He developed increased speech difficulty with less intelligible speech and increased ataxia over 3 days and was brought to the ER yesterday.   No fevers, chills, wound drainage.    Patient Active Problem List   Diagnosis Date Noted   Ischemic cerebrovascular accident (CVA) ([place]) 07/28/2022   Prolonged QT interval 07/28/2022   Pseudomeningocele 07/18/2022   Pseudomeningocele due to surgical procedure 07/18/2022   Status post craniotomy 04/11/2022   AVM (arteriovenous malformation) brain 04/11/2022   Impingement syndrome of left shoulder 12/22/2017   Impingement syndrome of right shoulder 12/22/2017   Rheumatoid arthritis involving right knee ([place]) 06/01/2015   Status post total right knee replacement 06/01/2015   Cardiomyopathy ([place]) 05/02/2015   Hyperlipidemia    Sleep apnea in adult    Rheumatoid arthritis ([place])    Chronic systolic heart failure ([place]) 05/01/2015   CAD (coronary artery disease), native coronary artery    Past Medical History:  Diagnosis Date   Anxiety    Arthritis, rheumatoid ([place])    dx 1988   CAD (coronary artery disease), native coronary artery    Mild LAD disease with calcification noted at cath 12//27/16    Cardiomyopathy ([place])    Cardiomyopathy ([place])    Chronic systolic heart failure ([place]) 05/01/2015   Depression    Deviated nasal septum 06/25/2011   Headache    High cholesterol    Hyperlipidemia    Impingement syndrome of left shoulder 12/22/2017   Macular degeneration    Rheumatoid aortitis    Sleep apnea    does not use cpap - UNABLE TO TOLERATE MASK   Sleep apnea in adult    deviated septum repaired, most recent sleep study was negative   Status post total right knee replacement 06/01/2015    Past Surgical  History:  Procedure Laterality Date   ANKLE FUSION Right 05/05/2012   related to his arthritis   ANKLE FUSION Left 05/25/2013   related to his arthritis   ANKLE SURGERY Bilateral    APPLICATION OF CRANIAL NAVIGATION N/A 04/11/2022   Procedure: APPLICATION OF CRANIAL NAVIGATION;  Surgeon: Consuella Lose, MD;  Location: [facility];  Service: Neurosurgery;  Laterality: N/A;   CARDIAC CATHETERIZATION N/A 05/01/2015   Procedure: Left Heart Cath and Coronary Angiography;  Surgeon: Belva Crome, MD;  Location: [facility];  Service: Cardiovascular;  Laterality: N/A;   CRANIOTOMY N/A 04/11/2022   Procedure: STEREOTACTIC SUBOCCIPITAL CRANIOTOMY FOR RESECTION OF ARTERIO-VENOUS MALFORMATION;  Surgeon: Consuella Lose, MD;  Location: [facility];  Service: Neurosurgery;  Laterality: N/A;   FRACTURE SURGERY     left femur fracture x 3   IR ANGIO EXTERNAL CAROTID SEL EXT CAROTID BILAT MOD SED  01/28/2022   IR ANGIO INTRA EXTRACRAN SEL INTERNAL CAROTID BILAT MOD SED  01/28/2022   IR ANGIO INTRA EXTRACRAN SEL INTERNAL CAROTID BILAT MOD SED  04/16/2022   IR ANGIO VERTEBRAL SEL VERTEBRAL BILAT MOD SED  01/28/2022   IR ANGIO VERTEBRAL SEL VERTEBRAL BILAT MOD SED  04/16/2022   KNEE ARTHROSCOPY     right x 4   LUMBAR LAMINECTOMY/DECOMPRESSION MICRODISCECTOMY N/A 07/18/2022   Procedure: Repair of Pseudomeningiocele posterior;  Surgeon: Consuella Lose, MD;  Location: [facility];  Service: Neurosurgery;  Laterality: N/A;  NASAL SEPTOPLASTY W/ TURBINOPLASTY  06/25/2011   Procedure: NASAL SEPTOPLASTY WITH TURBINATE REDUCTION;  Surgeon: Jerrell Belfast, MD;  Location: [facility];  Service: ENT;  Laterality: Bilateral;   PLACEMENT OF LUMBAR DRAIN N/A 07/18/2022   Procedure: PLACEMENT OF LUMBAR DRAIN;  Surgeon: Consuella Lose, MD;  Location: [facility];  Service: Neurosurgery;  Laterality: N/A;   TONSILLECTOMY     TOTAL KNEE ARTHROPLASTY Right 06/01/2015   Procedure: RIGHT TOTAL KNEE ARTHROPLASTY;  Surgeon: Mcarthur Rossetti, MD;  Location[facility];  Service: Orthopedics;  Laterality: Right;  Block+general    (Not in a hospital admission)  No Known Allergies  Social History   Tobacco Use   Smoking status: Former    Packs/day: 0.50    Years: 20.00    Additional pack years: 0.00    Total pack years: 10.00    Types: Cigarettes    Quit date: 05/24/2008    Years since quitting: 14.1   Smokeless tobacco: Never   Tobacco comments:    stopped smoking 8 yrs ago.  Substance Use Topics   Alcohol use: Yes    Comment: rarely    Family History  Problem Relation Age of Onset   Lupus Sister      Review of Systems Review of systems not obtained due to patient factors.  Objective:   Patient Vitals for the past 8 hrs:  BP Temp Temp src Pulse Resp SpO2  07/29/22 1200 122/79 98.1 F (36.7 C) Oral 81 14 98 %  07/29/22 1100 (!) 124/100 -- -- 95 (!) 21 96 %  07/29/22 1000 (!) 140/93 -- -- 92 17 96 %  07/29/22 0851 119/87 98.3 F (36.8 C) Oral (!) 101 (!) 21 96 %   I/O last 3 completed shifts: In: 720.8 [I.V.:720.8] Out: -  No intake/output data recorded.    Eyes open spontaneously. + dysarthria.  Wound c/d with small soft underlying fluid collection  Data ReviewCBC:  Lab Results  Component Value Date   WBC 10.9 (H) 07/28/2022   RBC 5.00 07/28/2022   BMP:  Lab Results  Component Value Date   GLUCOSE 90 07/29/2022   CO2 27 07/29/2022   BUN 10 07/29/2022   CREATININE 0.92 07/29/2022   CALCIUM 9.2 07/29/2022   Radiology review:   MRI brain performed yesterday was reviewed.  Small pseudomeningocele noted, significantly improved from preoperatively.  No significant hemorrhage in surgical cavity.  Some right cerebellar FLAIR signal noted.  Small partial hyperintensity over superior aspect of right cerebellar hemisphere on DWI.  No hydrocephalus seen  Assessment:   Principal Problem:   Ischemic cerebrovascular accident (CVA) ([place]) Active Problems:   CAD (coronary artery disease),  native coronary artery   Chronic systolic heart failure ([facility])   Rheumatoid arthritis ([facility])   Prolonged QT interval  62 yo M with hx of resection of cerebellar AVM and s/p repair of pseudomeningocele  Plan:  - no neurosurgical intervention indicated.  I agree with stroke neurology that this small DWI hyperintensity is not typical of stroke in morphology, distribution or intensity and is more likely postoperative change.  He does have some increased cerebellar FLAIR signal and may have some cerebellar dysfunction affecting his prosody.  He likely would benefit from steroids-- dexamethasone 4 mg q 8 x 3 days, tapering by half every 3 days.  He likely will be admitted for possible eventual rehab admission.  I discussed my clinical impression with the patient's wife.

## 2022-07-29 NOTE — Progress Notes (Addendum)
STROKE TEAM PROGRESS NOTE   INTERVAL HISTORY His wife is at the bedside.   Patient underwent repair of meningocele on 07/18/22.  By Dr. Kathyrn Sheriff.  Per wife patient was "out of it", had slurred speech, gait instability and balance issues since surgery. Discharged 07/25/22.  She stated his slurred speech worsened on Saturday and that the patient has been having issues walking and falling since going home.  MRI showed what could possibly be an acute right cerebellar infarct.   However with the extensive surgical changes, fluid and pressure changes in that area recently it is hard to tell if this is an actual stroke or just due to postop changes. Stroke workup continuing.  Patient exhibiting dysarthria with scanning speech.  Gait was not tested but from wife's description it sounds like truncal ataxia which would be prevalent due to a midline cerebellar issue. Neurosurgery was consulted and agrees that scan is not typical of stroke in morphology distribution or intensity and is more likely postoperative change.  Vitals:   07/29/22 0851 07/29/22 1000 07/29/22 1100 07/29/22 1200  BP: 119/87 (!) 140/93 (!) 124/100 122/79  Pulse: (!) 101 92 95 81  Resp: (!) 21 17 (!) 21 14  Temp: 98.3 F (36.8 C)   98.1 F (36.7 C)  TempSrc: Oral   Oral  SpO2: 96% 96% 96% 98%  Weight:      Height:       CBC:  Recent Labs  Lab 07/28/22 1644  WBC 10.9*  NEUTROABS 8.2*  HGB 11.5*  HCT 38.0*  MCV 76.0*  PLT [id number]   Basic Metabolic Panel:  Recent Labs  Lab 07/28/22 1644 07/29/22 0156 07/29/22 0200  NA 139 141  --   K 4.5 3.9  --   CL 100 102  --   CO2 27 27  --   GLUCOSE 116* 90  --   BUN 11 10  --   CREATININE 0.92 0.92  --   CALCIUM 9.5 9.2  --   MG  --   --  1.6*   Lipid Panel:  Recent Labs  Lab 07/29/22 0200  CHOL 118  TRIG 97  HDL 42  CHOLHDL 2.8  VLDL 19  LDLCALC 57   HgbA1c: No results for input(s): "HGBA1C" in the last 168 hours. Urine Drug Screen: No results for input(s):  "LABOPIA", "COCAINSCRNUR", "LABBENZ", "AMPHETMU", "THCU", "LABBARB" in the last 168 hours.  Alcohol Level No results for input(s): "ETH" in the last 168 hours.  IMAGING past 24 hours CT ANGIO HEAD NECK W WO CM  Result Date: 07/29/2022 CLINICAL DATA:  Stroke, determine embolic source EXAM: CT ANGIOGRAPHY HEAD AND NECK TECHNIQUE: Multidetector CT imaging of the head and neck was performed using the standard protocol during bolus administration of intravenous contrast. Multiplanar CT image reconstructions and MIPs were obtained to evaluate the vascular anatomy. Carotid stenosis measurements (when applicable) are obtained utilizing NASCET criteria, using the distal internal carotid diameter as the denominator. RADIATION DOSE REDUCTION: This exam was performed according to the departmental dose-optimization program which includes automated exposure control, adjustment of the mA and/or kV according to patient size and/or use of iterative reconstruction technique. CONTRAST:  28mL OMNIPAQUE IOHEXOL 350 MG/ML SOLN COMPARISON:  Brain MRI from yesterday FINDINGS: CT HEAD FINDINGS Brain: Pseudomeningocele with recent repair and posterior fossa gas along the upper cerebellum. Infarct by MRIs difficult to separate from chronic low-density in the right more than left cerebral hemisphere. Unchanged collection with mass effect asymmetrically to the left cerebral  hemisphere. No hydrocephalus or acute hemorrhage. Vascular: See below Skull: Remote suboccipital craniectomy. Sinuses/Orbits: Negative Review of the MIP images confirms the above findings CTA NECK FINDINGS Aortic arch: Atheromatous plaque with 3 vessel branching. Right carotid system: Mild atheromatous plaque at the bifurcation. No ulceration or significant stenosis Left carotid system: Mild atheromatous plaque at the bifurcation. No ulceration or significant stenosis. Vertebral arteries: No proximal subclavian stenosis. Mild atheromatous plaque at the right vertebral  origin. No beading or ulceration. Extrinsic narrowing of the left vertebral artery at C5-6 due to facet spurring from behind, narrowing measuring 60% on sagittal reformats. Skeleton: Ordinary cervical spine degeneration. Other neck: No acute finding. Known dissecting fluid collection spanning from the suboccipital craniectomy inferiorly into the neck. Upper chest: Clear apical lungs. Review of the MIP images confirms the above findings CTA HEAD FINDINGS Anterior circulation: Mild calcified plaque along the carotid siphons. No stenosis, branch occlusion, beading, or aneurysm Posterior circulation: The vertebral and basilar arteries are smoothly contoured and widely patent. Symmetrically enhancing superior cerebellar arteries without visible occlusion or irregularity. Symmetric and smoothly contoured posterior cerebral arteries. Venous sinuses: Unremarkable for arterial timing. Anatomic variants: None significant Review of the MIP images confirms the above findings IMPRESSION: 1. No vascular lesion seen underlying the cerebellar infarct. 2. Chronic 60% narrowing of the left vertebral artery due to extrinsic compression by C5-6 left facet osteophyte. 3. Limited atheromatous change for age. Electronically Signed   By: Jorje Guild M.D.   On: 07/29/2022 05:06   MR Brain Wo Contrast (neuro protocol)  Result Date: 07/28/2022 CLINICAL DATA:  Neuro deficit, acute, stroke suspected. EXAM: MRI HEAD WITHOUT CONTRAST TECHNIQUE: Multiplanar, multiecho pulse sequences of the brain and surrounding structures were obtained without intravenous contrast. COMPARISON:  Head CT 07/28/2022.  MRI brain 07/05/2022. FINDINGS: Brain: New small focus of acute infarction in the right superior cerebellar hemisphere with surrounding area of increased T2 signal and cystic change, which is new since the prior MRI dated 07/05/2022 and suspicious for now chronic infarct. Postoperative changes of prior left posterior fossa resection.  Redemonstrated foci of gas within the resection cavity, fourth ventricle and extra-axial spaces, likely secondary to recent pseudomeningocele repair and/or lumbar drain placement/removal. No acute intracranial hemorrhage.  No hydrocephalus. Vascular: Normal flow voids. Skull and upper cervical spine: Suboccipital craniectomy. Sinuses/Orbits: Trace fluid in the right sphenoid sinus. Other: Decreased size of the fluid collection in the dorsal soft tissues overlying the suboccipital craniectomy. IMPRESSION: 1. New small focus of acute infarction in the right superior cerebellar hemisphere with surrounding area of increased T2 signal and cystic change, which is new since the prior MRI dated 07/05/2022 and suspicious for now chronic infarct. 2. Postoperative changes of prior left posterior fossa resection. Redemonstrated foci of gas within the resection cavity, fourth ventricle and extra-axial spaces, likely secondary to recent pseudomeningocele repair and/or lumbar drain placement/removal. 3. Decreased size of the fluid collection in the dorsal soft tissues overlying the suboccipital craniectomy. Electronically Signed   By: Emmit Alexanders M.D.   On: 07/28/2022 20:06   CT Head Wo Contrast  Result Date: 07/28/2022 CLINICAL DATA:  Headache, neuro deficit. EXAM: CT HEAD WITHOUT CONTRAST TECHNIQUE: Contiguous axial images were obtained from the base of the skull through the vertex without intravenous contrast. RADIATION DOSE REDUCTION: This exam was performed according to the departmental dose-optimization program which includes automated exposure control, adjustment of the mA and/or kV according to patient size and/or use of iterative reconstruction technique. COMPARISON:  Head CT 04/29/2022.  MRI  brain 07/05/2022. 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 New pneumocephalus in the resection cavity, likely  secondary to recent pseudomeningocele repair and lumbar drain placement/removal. No acute intracranial hemorrhage, mass effect or midline shift. No hydrocephalus. Vascular: No hyperdense vessel or unexpected calcification. Skull: Suboccipital craniectomy. Sinuses/Orbits: Trace fluid in the sphenoid sinus. Orbits are unremarkable. Other: Partially imaged fluid collection in the dorsal soft tissues overlying the suboccipital craniectomy, likely residual or recurrent pseudomeningocele. IMPRESSION: 1. 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 New pneumocephalus in the resection cavity, likely secondary to recent pseudomeningocele repair and lumbar drain placement/removal. No acute intracranial hemorrhage. 2. Partially imaged fluid collection in the dorsal soft tissues overlying the suboccipital craniectomy, likely residual or recurrent pseudomeningocele. Electronically Signed   By: Emmit Alexanders M.D.   On: 07/28/2022 17:15    PHYSICAL EXAM  Temp:  [98.1 F (36.7 C)-98.9 F (37.2 C)] 98.1 F (36.7 C) (03/26 1200) Pulse Rate:  [65-101] 81 (03/26 1200) Resp:  [11-21] 14 (03/26 1200) BP: (107-140)/(67-103) 122/79 (03/26 1200) SpO2:  [93 %-98 %] 98 % (03/26 1200) Weight:  [90.3 kg] 90.3 kg (03/25 1615)  General - Well nourished, well developed caucasian male, in no apparent distress. Swelling noted to occipital region, wife states this has improved since surgery.  Cardiovascular - Regular rhythm and rate.  Mental status/Cognition: Alert, oriented to self, place, month and year, good attention.  Speech/language: significantly dysarthric scanning cerebellar speech, non fluent, comprehension intact, object naming intact, repetition intact.  Cranial Nerves II - XII - II - Visual field intact OU. III, IV, VI - Extraocular movements intact, slightly slower to the right, no nystagmus. V - Facial sensation intact  bilaterally. VII - Facial movement intact bilaterally. VIII - Hearing intact to voice X - Palate elevates symmetrically. XI - Chin turning & shoulder shrug intact bilaterally. XII - Tongue protrusion midline  Motor Strength - The patient's strength was normal in all extremities and pronator drift was absent.  Bulk was normal and fasciculations were absent.   Motor Tone - Muscle tone was assessed at the neck and appendages and was normal.  Sensory - Light touch assessed and symmetrical.    Coordination - The patient had normal movements in the hands and feet with no ataxia or dysmetria.  Tremor was absent. Likely truncal ataxia from the description of symptoms.   Gait and Station - deferred.  As patient complains of truncal ataxia  ASSESSMENT/PLAN [name] is a 62 y.o. male with PMH significant for CAD, OSA, HLD, cerebellar AVM s/p posterior fossa craniotomy for resection in Dec 2023 and then recent operative repair of pseudomeningiocele and just discharged on Friday who presents with slurred speech, worsening gait. His neurologic examination is notable for truncal ataxia. MRI read states acute on likely chronic R superior cerebellar infarct. However, in comparing the most recent MRI to the one post-op, it is hard to tell if this is a stroke or is a deficit from the fluid shift/pressure changes occurred post-op/post-drain removal.   Timeline of is symptoms is somewhat unclear as symptoms were noted after surgery back in December and more recently after second surgery on March 15th. Wife states that the symptoms have gotten worse since going home, but were initially present while in the hospital after the surgery.   R cerebellar infarct versus post-op changes from recent surgery x 2 Etiology:  pending testing  CT 3/25: Similar postoperative appearance from prior suboccipital craniectomy for resection of the posterior fossa arteriovenous malformation. New pneumocephalus  in the resection cavity, likely secondary to recent pseudomeningocele repair and lumbar drain placement/removal. No acute intracranial hemorrhage. Partially imaged fluid collection in the dorsal soft tissues overlying the suboccipital craniectomy, likely residual or recurrent pseudomeningocele. MRI  3/25: New small focus of acute infarction in the right superior cerebellar hemisphere with surrounding area of increased T2 signal and cystic change, which is new since the prior MRI dated 07/05/2022 and suspicious for now chronic infarct. Postoperative changes of prior left posterior fossa resection. Redemonstrated foci of gas within the resection cavity, fourth ventricle and extra-axial spaces, likely secondary to recent pseudomeningocele repair and/or lumbar drain placement/removal. Decreased size of the fluid collection in the dorsal soft tissues overlying the suboccipital craniectomy Neurosurgery consulted: Agree that small DWI hyperintensity is not typical of stroke in morphology, distribution or intensity and is more likely postoperative change. Recommends  steroids-- dexamethasone 4 mg q 8 x 3 days, tapering by half every 3 days.   2D Echo: pending  LDL 57 HgbA1c No results found for requested labs within last 1095 days. VTE prophylaxis - lovenox    Diet   Diet regular Fluid consistency: Thin   aspirin 81 mg daily prior to admission, now on aspirin 81 mg daily.  Therapy recommendations:  pending Disposition:  pending  Hypertension Home meds: Metoprolol 25 mg, lisinopril 10 mg Permissive hypertension (OK if < 200/120) but gradually normalize in 1-2 days Long-term BP goal normotensive  Hyperlipidemia Home meds: Crestor 20 mg, resumed in hospital LDL 57, goal < 70 Continue statin at discharge   Other Stroke Risk Factors Former Cigarette smoker Coronary artery disease Obstructive sleep apnea Congestive heart failure  Other Active Problems Depression Rheumatoid  Arthritis  Hospital day # 0   Pt seen by Neuro NP/APP and later by MD. Note/plan to be edited by MD as needed.    Otelia Santee, DNP, AGACNP-BC [facility] Please use AMION for pager and EPIC for messaging  I have personally obtained history,examined this patient, reviewed notes, independently viewed imaging studies, participated in medical decision making and plan of care.ROS completed by me personally and pertinent positives fully documented  I have made any additions or clarifications directly to the above note. Agree with note above.  Patient underwent posterior fossa surgery for left cerebellar AVM resection initially and then had pseudomeningocele excision last week following which she has had some slurred speech and gait ataxia which got worse prompting this admission.  MRI shows weak diffusion positive lesion in the right superior cerebellum but given other abnormalities including a new CSF cyst collection and FLAIR hyperintensities these likely represent postoperative changes rather than a true cerebellar infarct.  Case discussed with neurosurgeon Dr. Marcello Moores who agrees.  Plan Short course of steroids to see if it helps.  Continue ongoing stroke workup.  Physical Occupational Therapy consults and patient would likely benefit from inpatient rehab stay.  Continue aspirin 81 mg daily for now and aggressive risk factor modification.  Long discussion with patient and wife at the bedside and answered questions.  Discussed with Dr. Posey Pronto and Dr. Marcello Moores neurosurgeon.     Greater than 50% time during this 50-minute visit were spent in counseling and coordination of care about his abnormal MRI and discussion about evaluation and treatment plan and answering questions.  Antony Contras, MD Medical Director [facility] Pager: [phone] 07/29/2022 3:29 PM  To contact  Stroke Continuity provider, please refer to [email or web address] After hours, contact General Neurology

## 2022-07-29 NOTE — Progress Notes (Signed)
[facility] Progress Note Patient: Rephael Brincefield [id number] DOB: 26-Aug-1960 DOA: 07/28/2022  DOS: the patient was seen and examined on 07/29/2022  Brief hospital course: PMH of rheumatoid arthritis, CAD, OSA with CPAP intolerance, chronic systolic CHF, AVM resection in December, repair of pseudomeningocele on 3/15, depression present to the hospital with dysarthria and gait difficulty. Found to have postop cerebral edema likely responsible for his dysarthria and gait difficulty.  Neurology and neurosurgery were consulted.  Currently on IV steroids. Assessment and Plan: Postop changes from recent neurosurgery. Concern for right cerebellar infarct Presents with complaints of dysarthria as well as gait ataxia. CT of the head shows postop changes. MRI brain shows area of small focus of infarction in the right superior cerebellar hemisphere. Neurology was consulted. Concern is that that this appears to be most likely post op changes rather than acute stroke. Neurosurgery was consulted. Recommend to initiate the patient on dexamethasone 4 mg every 8 hours for 3 days with tapering by half every 3-day after that. LDL 57.  Hemoglobin A1c pending.  Currently on Lovenox for DVT prophylaxis. Speech therapy cleared the patient and currently on regular diet. PT OT recommends AIR. Neurology recommends 80 mg aspirin on discharge. Echocardiogram currently pending.  History of left cerebellar AVM resection 12/23. Pseudomeningocele repair and lumbar drain placement on 3/15. Appreciate neurosurgery consultation. Presentation most likely appear to be postop changes. Currently on Decadron.  OSA. Noncompliance with CPAP. Placing the patient at high risk of poor outcome.  CAD. Chronic HFrEF. EF 40%. Echocardiogram currently pending. On Aldactone currently holding. On aspirin continue.  HLD. On Crestor continue.  Depression. Currently no SI or HI. Continue Zoloft.  History  of rheumatoid arthritis. On methotrexate, sulfasalazine, rituximab. Currently holding on medication. Receiving steroids.  Patient was on gabapentin. Currently on hold.   Subjective: Patient is drowsy.  Easily arousable.  Denies any headache.  Denies any dizziness.  No nausea no vomiting no fever no chills.  No chest pain.  No abdomen pain.  Clearly has apneic spell at the time of my evaluation while sleeping.  Physical Exam: General: in Mild distress, No Rash Cardiovascular: S1 and S2 Present, No Murmur Respiratory: Good respiratory effort, Bilateral Air entry present. No Crackles, No wheezes Abdomen: Bowel Sound present, No tenderness Extremities: No edema Neuro: Alert and oriented x3, no new focal deficit, past-pointing on the right.  No asterixis.  Data Reviewed: I have Reviewed nursing notes, Vitals, and Lab results. Since last encounter, pertinent lab results CBC and BMP   . I have ordered test including CBC and BMP  . I have discussed pt's care plan and test results with neurology  .   Disposition: Status is: Inpatient Remains inpatient appropriate because: Need IV steroids and CIR.  enoxaparin (LOVENOX) injection 40 mg Start: 07/28/22 2230   Family Communication: Wife at bedside Level of care: Telemetry Medical   Vitals:   07/29/22 1100 07/29/22 1200 07/29/22 1610 07/29/22 1656  BP: (!) 124/100 122/79  121/78  Pulse: 95 81  83  Resp: (!) 21 14    Temp:  98.1 F (36.7 C) 98 F (36.7 C) 97.8 F (36.6 C)  TempSrc:  Oral Oral Oral  SpO2: 96% 98%  96%  Weight:      Height:         Author: Berle Mull, MD 07/29/2022 5:58 PM  Please look on [email or web address] to find out who is on call.

## 2022-07-30 ENCOUNTER — Inpatient Hospital Stay (HOSPITAL_COMMUNITY): Payer: BC Managed Care – PPO

## 2022-07-30 DIAGNOSIS — F32A Depression, unspecified: Secondary | ICD-10-CM

## 2022-07-30 DIAGNOSIS — R7303 Prediabetes: Secondary | ICD-10-CM

## 2022-07-30 DIAGNOSIS — G4733 Obstructive sleep apnea (adult) (pediatric): Secondary | ICD-10-CM

## 2022-07-30 DIAGNOSIS — M0609 Rheumatoid arthritis without rheumatoid factor, multiple sites: Secondary | ICD-10-CM | POA: Diagnosis not present

## 2022-07-30 DIAGNOSIS — R270 Ataxia, unspecified: Secondary | ICD-10-CM | POA: Diagnosis not present

## 2022-07-30 DIAGNOSIS — R471 Dysarthria and anarthria: Secondary | ICD-10-CM

## 2022-07-30 DIAGNOSIS — R1310 Dysphagia, unspecified: Secondary | ICD-10-CM

## 2022-07-30 DIAGNOSIS — I5042 Chronic combined systolic (congestive) and diastolic (congestive) heart failure: Secondary | ICD-10-CM | POA: Diagnosis not present

## 2022-07-30 DIAGNOSIS — E785 Hyperlipidemia, unspecified: Secondary | ICD-10-CM

## 2022-07-30 DIAGNOSIS — I5022 Chronic systolic (congestive) heart failure: Secondary | ICD-10-CM | POA: Diagnosis not present

## 2022-07-30 DIAGNOSIS — I639 Cerebral infarction, unspecified: Secondary | ICD-10-CM | POA: Diagnosis not present

## 2022-07-30 LAB — CBC
HCT: 34.7 % — ABNORMAL LOW (ref 39.0–52.0)
Hemoglobin: 10.7 g/dL — ABNORMAL LOW (ref 13.0–17.0)
MCH: 23 pg — ABNORMAL LOW (ref 26.0–34.0)
MCHC: 30.8 g/dL (ref 30.0–36.0)
MCV: 74.5 fL — ABNORMAL LOW (ref 80.0–100.0)
Platelets: 231 10*3/uL (ref 150–400)
RBC: 4.66 MIL/uL (ref 4.22–5.81)
RDW: 18.6 % — ABNORMAL HIGH (ref 11.5–15.5)
WBC: 7.7 10*3/uL (ref 4.0–10.5)
nRBC: 0 % (ref 0.0–0.2)

## 2022-07-30 LAB — MAGNESIUM: Magnesium: 1.9 mg/dL (ref 1.7–2.4)

## 2022-07-30 LAB — BASIC METABOLIC PANEL
Anion gap: 12 (ref 5–15)
BUN: 9 mg/dL (ref 6–20)
CO2: 28 mmol/L (ref 22–32)
Calcium: 9.2 mg/dL (ref 8.9–10.3)
Chloride: 99 mmol/L (ref 98–111)
Creatinine, Ser: 0.78 mg/dL (ref 0.61–1.24)
GFR, Estimated: >60 mL/min (ref >60)
Glucose, Bld: 120 mg/dL — ABNORMAL HIGH (ref 70–99)
Potassium: 3.7 mmol/L (ref 3.5–5.1)
Sodium: 139 mmol/L (ref 135–145)

## 2022-07-30 LAB — BRAIN NATRIURETIC PEPTIDE: B Natriuretic Peptide: 22.5 pg/mL (ref 0.0–100.0)

## 2022-07-30 LAB — HEMOGLOBIN A1C
Hgb A1c MFr Bld: 6 % — ABNORMAL HIGH (ref 4.8–5.6)
Mean Plasma Glucose: 126 mg/dL

## 2022-07-30 NOTE — Plan of Care (Signed)
  Problem: Clinical Measurements: Goal: Neurologic status will improve Outcome: Progressing   Problem: Skin Integrity: Goal: Demonstration of wound healing without infection will improve Outcome: Progressing   Problem: Education: Goal: Knowledge of disease or condition will improve Outcome: Progressing   Problem: Ischemic Stroke/TIA Tissue Perfusion: Goal: Complications of ischemic stroke/TIA will be minimized Outcome: Progressing   Problem: Nutrition: Goal: Risk of aspiration will decrease Outcome: Progressing

## 2022-07-30 NOTE — Progress Notes (Addendum)
On call notified re: pt's wife had attempted to give pt various liquids. When entering room, pt had a congested cough. When attempting to drink, pt coughing, choking, drooling. Pt has baseline asymmetrical face/lips, slurred speech. Participated in assessment, follows commands.

## 2022-07-30 NOTE — Progress Notes (Signed)
    Inpatient Rehabilitation Admissions Coordinator   Met with patient and wife at bedside for rehab assessment. We discussed goals and expectations of a possible CIR admit. They prefer CIR for rehab. Wife would have to take FMLA to provide supervision after CIR if needed. I will begin insurance Auth with [employer] for possible CIR admit pending approval. Please call me with any questions.   Danne Baxter, RN, MSN Rehab Admissions Coordinator [phone]

## 2022-07-30 NOTE — Progress Notes (Signed)
Speech Language Pathology Treatment: Dysphagia  Patient Details Name: Christopher Yu MRN: [id number] DOB: 11-28-1960 Today's Date: 07/30/2022 Time: [id number] SLP Time Calculation (min) (ACUTE ONLY): 23 min  Assessment / Plan / Recommendation Clinical Impression  Notified by dietitian that pt had difficulty with lunch. Pt and wife stated he got choked with scrambled eggs. Pt is on Dys 2 diet, nectar thick liquids and tried to order soup and peanut butter sandwich. Educated that these are difficult textures and would not be safest textures (soups can be thickened with Simply Thick). Looked at menu with pt and spouse and helped chose items that may be safest and easiest to manage. Will upgrade diet texture to regular so he has fewer restrictions and can order foods (softer) that he feels he can manage.  Observed with nectar thick juice and he independently turned head to the left. Consumed pudding and immediately began to talk after swallow with noted wet vocal quality. Educated him to refrain from talking right after he swallows but to swallow a second time and intermittently clear his throat. He needed cues to be in a more upright position as well. ST will continue to follow for strategies and speech-language-cognitive assessment tomorrow.    HPI HPI: 62 y.o. male [facility] to [facility] hospital [facility] 07[facility]22 with slurred speech, worsening gait and HA. Pt recently discharged 07/25/2022 after operative repair of pseudomeningiocele. MRI brain 3/25 demonstrates acute R cerebellar infarct. PMH: for CAD, R TKA, cardiomyopathy, chronic systolic heart failure, macular degeneration.      SLP Plan  Continue with current plan of care      Recommendations for follow up therapy are one component of a multi-disciplinary discharge planning process, led by the attending physician.  Recommendations may be updated based on patient status, additional functional criteria and insurance authorization.     Recommendations  Diet recommendations: Regular;Nectar-thick liquid Liquids provided via: Cup;No straw Medication Administration: Crushed with puree Supervision: Patient able to self feed;Full supervision/cueing for compensatory strategies Compensations: Slow rate;Small sips/bites;Multiple dry swallows after each bite/sip;Clear throat intermittently Postural Changes and/or Swallow Maneuvers: Seated upright 90 degrees                Rehab consult Oral care BID   Frequent or constant Supervision/Assistance Dysphagia, oropharyngeal phase (R13.12)     Continue with current plan of care     Houston Siren  07/30/2022, 3:07 PM

## 2022-07-30 NOTE — Progress Notes (Signed)
STROKE TEAM PROGRESS NOTE   INTERVAL HISTORY His wife is at the bedside.   Patient is lying comfortably in bed.  Speech therapist is at the room and recommends dysphagia 2 diet and patient advised to bend his neck to the left while drinking liquids.  Therapist recommend inpatient rehab patient and family are willing Neurological exam unchanged.  Vital signs stable Vitals:   07/29/22 2330 07/30/22 0322 07/30/22 0745 07/30/22 1132  BP: 127/78 120/65 108/80 108/87  Pulse: 92 (!) 105 83 94  Resp: 18 18 18 18   Temp: 98.1 F (36.7 C) 98.7 F (37.1 C) 97.9 F (36.6 C) 98.1 F (36.7 C)  TempSrc: Oral Oral Oral Oral  SpO2: 92% 93% 97% 96%  Weight:      Height:       CBC:  Recent Labs  Lab 07/28/22 1644 07/30/22 0401  WBC 10.9* 7.7  NEUTROABS 8.2*  --   HGB 11.5* 10.7*  HCT 38.0* 34.7*  MCV 76.0* 74.5*  PLT 240 [id number]   Basic Metabolic Panel:  Recent Labs  Lab 07/29/22 0156 07/29/22 0200 07/30/22 0401  NA 141  --  139  K 3.9  --  3.7  CL 102  --  99  CO2 27  --  28  GLUCOSE 90  --  120*  BUN 10  --  9  CREATININE 0.92  --  0.78  CALCIUM 9.2  --  9.2  MG  --  1.6* 1.9   Lipid Panel:  Recent Labs  Lab 07/29/22 0200  CHOL 118  TRIG 97  HDL 42  CHOLHDL 2.8  VLDL 19  LDLCALC 57   HgbA1c:  Recent Labs  Lab 07/29/22 0200  HGBA1C 6.0*   Urine Drug Screen: No results for input(s): "LABOPIA", "COCAINSCRNUR", "LABBENZ", "AMPHETMU", "THCU", "LABBARB" in the last 168 hours.  Alcohol Level No results for input(s): "ETH" in the last 168 hours.  IMAGING past 24 hours DG Swallowing Func-Speech Pathology  Result Date: 07/30/2022 Table formatting from the original result was not included. Modified Barium Swallow Study Patient Details Name: Christopher Yu MRN: [id number] Date of Birth: 08/31/1960 Today's Date: 07/30/2022 HPI/PMH: HPI: 62 y.o. male presents to [facility] hospital on 07/28/2022 with slurred speech, worsening gait and HA. Pt recently discharged 07/25/2022 after  operative repair of pseudomeningiocele. MRI brain 3/25 demonstrates acute R cerebellar infarct. PMH: for CAD, R TKA, cardiomyopathy, chronic systolic heart failure, macular degeneration. Clinical Impression: Clinical Impression: Pt demonstrated minimal oral dysphagia with trace lingual residue present intermittently and mild pharyngeal dysphagia. Pt exhibited decreased laryngeal elevation with thin barium entering airway prior to full laryngeal closure resulting in penetration remaining on the anterior portion of vestibule. A chin tuck posture was not effective and he continued to penetrate with spontaeous cough that expelled penetrates. A head turn to left with thin resulted in sensed aspiration with weak cough. Nectar thick was also penetrated during and after the swallow and chin tuck was ineffective however a head turn to the left was 90% effective to prevent penetration over multiple trials. Reduced tonuge base retraction led to minimal vallecular residue consistently. Pt was able to masticate solid texture with flash penetration. Given deconditioning recommend he start a more conservative diet texture of Dys 2 (minced), nectar thick with head turn to the left with liquids, intermittent cough/throat clear, no straws, small sips and initiate meds crushed in puree (can advance to whole in puree as able). Factors that may increase risk of adverse event in presence of  aspiration ([place] 2021): No data recorded Recommendations/Plan: Swallowing Evaluation Recommendations Swallowing Evaluation Recommendations Recommendations: PO diet PO Diet Recommendation: Dysphagia 2 (Finely chopped); Mildly thick liquids (Level 2, nectar thick) Liquid Administration via: Cup; No straw Medication Administration: Crushed with puree Supervision: Staff to assist with self-feeding; Full supervision/cueing for swallowing strategies Swallowing strategies  : Head turn left during swallowing (with liquids) Postural changes:  Position pt fully upright for meals Oral care recommendations: Oral care BID (2x/day) Treatment Plan Treatment Plan Treatment recommendations: Therapy as outlined in treatment plan below Follow-up recommendations: Acute inpatient rehab (3 hours/day) Functional status assessment: Patient has had a recent decline in their functional status and demonstrates the ability to make significant improvements in function in a reasonable and predictable amount of time. Treatment frequency: Min 2x/week Treatment duration: 2 weeks Interventions: Aspiration precaution training; Patient/family education; Compensatory techniques; Trials of upgraded texture/liquids Recommendations Recommendations for follow up therapy are one component of a multi-disciplinary discharge planning process, led by the attending physician.  Recommendations may be updated based on patient status, additional functional criteria and insurance authorization. Assessment: Orofacial Exam: Orofacial Exam Oral Cavity: Oral Hygiene: WFL Oral Cavity - Dentition: Adequate natural dentition Orofacial Anatomy: Other (comment) Oral Motor/Sensory Function: Suspected cranial nerve impairment CN V - Trigeminal: WFL CN VII - Facial: Left motor impairment CN IX - Glossopharyngeal, CN X - Vagus: Not tested Anatomy: Anatomy: WFL Boluses Administered: Boluses Administered Boluses Administered: Thin liquids (Level 0); Mildly thick liquids (Level 2, nectar thick); Moderately thick liquids (Level 3, honey thick); Puree; Solid  Oral Impairment Domain: Oral Impairment Domain Lip Closure: No labial escape Tongue control during bolus hold: Cohesive bolus between tongue to palatal seal Bolus preparation/mastication: Timely and efficient chewing and mashing Bolus transport/lingual motion: Brisk tongue motion Oral residue: Trace residue lining oral structures Location of oral residue : Tongue Initiation of pharyngeal swallow : Pyriform sinuses  Pharyngeal Impairment Domain: Pharyngeal  Impairment Domain Soft palate elevation: No bolus between soft palate (SP)/pharyngeal wall (PW) Laryngeal elevation: Partial superior movement of thyroid cartilage/partial approximation of arytenoids to epiglottic petiole Anterior hyoid excursion: Partial anterior movement Epiglottic movement: Complete inversion Laryngeal vestibule closure: Incomplete, narrow column air/contrast in laryngeal vestibule Pharyngeal stripping wave : Present - complete Pharyngeal contraction (A/P view only): N/A Pharyngoesophageal segment opening: Complete distension and complete duration, no obstruction of flow Tongue base retraction: Trace column of contrast or air between tongue base and PPW Pharyngeal residue: Collection of residue within or on pharyngeal structures Location of pharyngeal residue: Valleculae  Esophageal Impairment Domain: Esophageal Impairment Domain Esophageal clearance upright position: Complete clearance, esophageal coating Pill: Esophageal Impairment Domain Esophageal clearance upright position: Complete clearance, esophageal coating Penetration/Aspiration Scale Score: Penetration/Aspiration Scale Score 2.  Material enters airway, remains ABOVE vocal cords then ejected out: Thin liquids (Level 0) 3.  Material enters airway, remains ABOVE vocal cords and not ejected out: Thin liquids (Level 0) 4.  Material enters airway, CONTACTS cords then ejected out: Mildly thick liquids (Level 2, nectar thick) Compensatory Strategies: Compensatory Strategies Compensatory strategies: Yes Chin tuck: Ineffective Ineffective Chin Tuck: Thin liquid (Level 0); Mildly thick liquid (Level 2, nectar thick) Left head turn: Effective (ineffective with thin, effective with nectar) Effective Left Head Turn: Mildly thick liquid (Level 2, nectar thick) Ineffective Left Head Turn: Thin liquid (Level 0)   General Information: Caregiver present: No  Diet Prior to this Study: NPO   Temperature : Normal   Respiratory Status: WFL   Supplemental  O2: None (Room air)  History of Recent Intubation: No  Behavior/Cognition: Alert; Cooperative; Pleasant mood Self-Feeding Abilities: Able to self-feed Baseline vocal quality/speech: Hypophonia/low volume Volitional Cough: Able to elicit Volitional Swallow: Able to elicit No data recorded Goal Planning: Prognosis for improved oropharyngeal function: Good No data recorded No data recorded No data recorded Consulted and agree with results and recommendations: Patient; Family member/caregiver Pain: Pain Assessment Pain Assessment: No/denies pain Pain Score: 5 Faces Pain Scale: 4 Pain Location: Headache Pain Descriptors / Indicators: Grimacing Pain Intervention(s): Monitored during session End of Session: Start Time:SLP Start Time (ACUTE ONLY): 1038 Stop Time: SLP Stop Time (ACUTE ONLY): 1051 Time Calculation:SLP Time Calculation (min) (ACUTE ONLY): 13 min Charges: SLP Evaluations $ SLP Speech Visit: 1 Visit SLP Evaluations $BSS Swallow: 1 Procedure $MBS Swallow: 1 Procedure SLP visit diagnosis: SLP Visit Diagnosis: Dysphagia, oropharyngeal phase (R13.12) Past Medical History: Past Medical History: Diagnosis Date  Anxiety   Arthritis, rheumatoid ([place])   dx 1988  CAD (coronary artery disease), native coronary artery   Mild LAD disease with calcification noted at cath 12//27/16   Cardiomyopathy ([place])   Cardiomyopathy ([place])   Chronic systolic heart failure ([place]) 05/01/2015  Depression   Deviated nasal septum 06/25/2011  Headache   High cholesterol   Hyperlipidemia   Impingement syndrome of left shoulder 12/22/2017  Macular degeneration   Rheumatoid aortitis   Sleep apnea   does not use cpap - UNABLE TO TOLERATE MASK  Sleep apnea in adult   deviated septum repaired, most recent sleep study was negative  Status post total right knee replacement 06/01/2015 Past Surgical History: Past Surgical History: Procedure Laterality Date  ANKLE FUSION Right 05/05/2012  related to his arthritis  ANKLE FUSION Left 05/25/2013  related to  his arthritis  ANKLE SURGERY Bilateral   APPLICATION OF CRANIAL NAVIGATION N/A [id number]  Procedure: APPLICATION OF CRANIAL NAVIGATION;  Surgeon: Consuella Lose, MD;  Location: [facility];  Service: Neurosurgery;  Laterality: N/A;  CARDIAC CATHETERIZATION N/A 05/01/2015  Procedure: Left Heart Cath and Coronary Angiography;  Surgeon: Belva Crome, MD;  Location: [facility];  Service: Cardiovascular;  Laterality: N/A;  CRANIOTOMY N/A 04/11/2022  Procedure: STEREOTACTIC SUBOCCIPITAL CRANIOTOMY FOR RESECTION OF ARTERIO-VENOUS MALFORMATION;  Surgeon: Consuella Lose, MD;  Location: [facility];  Service: Neurosurgery;  Laterality: N/A;  FRACTURE SURGERY    left femur fracture x 3  IR ANGIO EXTERNAL CAROTID SEL EXT CAROTID BILAT MOD SED  01/28/2022  IR ANGIO INTRA EXTRACRAN SEL INTERNAL CAROTID BILAT MOD SED  01/28/2022  IR ANGIO INTRA EXTRACRAN SEL INTERNAL CAROTID BILAT MOD SED  04/16/2022  IR ANGIO VERTEBRAL SEL VERTEBRAL BILAT MOD SED  01/28/2022  IR ANGIO VERTEBRAL SEL VERTEBRAL BILAT MOD SED  04/16/2022  KNEE ARTHROSCOPY    right x 4  LUMBAR LAMINECTOMY/DECOMPRESSION MICRODISCECTOMY N/A 07/18/2022  Procedure: Repair of Pseudomeningiocele posterior;  Surgeon: Consuella Lose, MD;  Location: [facility];  Service: Neurosurgery;  Laterality: N/A;  NASAL SEPTOPLASTY W/ TURBINOPLASTY  06/25/2011  Procedure: NASAL SEPTOPLASTY WITH TURBINATE REDUCTION;  Surgeon: Jerrell Belfast, MD;  Location: [facility];  Service: ENT;  Laterality: Bilateral;  PLACEMENT OF LUMBAR DRAIN N/A 07/18/2022  Procedure: PLACEMENT OF LUMBAR DRAIN;  Surgeon: Consuella Lose, MD;  Location: [facility];  Service: Neurosurgery;  Laterality: N/A;  TONSILLECTOMY    TOTAL KNEE ARTHROPLASTY Right 06/01/2015  Procedure: RIGHT TOTAL KNEE ARTHROPLASTY;  Surgeon: Mcarthur Rossetti, MD;  Location: [facility];  Service: Orthopedics;  Laterality: Right;  Block+general [place] Siren 07/30/2022, 12:27 PM  ECHOCARDIOGRAM COMPLETE  Result Date: 07/29/2022     ECHOCARDIOGRAM REPORT   Patient Name:   ALEXXANDER BRINDLE Date of Exam: 07/29/2022 Medical Rec #:  [id number]                   Height:       73.0 in Accession #:    [id number]                  Weight:       199.0 lb Date of Birth:  [birth year]                   BSA:          2.147 m Patient Age:    62 years                    BP:           124/103 mmHg Patient Gender: M                           HR:           93 bpm. Exam Location:  Inpatient Procedure: 2D Echo, Cardiac Doppler and Color Doppler Indications:    Stroke I63.9  History:        Patient has prior history of Echocardiogram examinations, most                 recent 06/22/2017. Cardiomyopathy and CHF, CAD, Stroke; Risk                 Factors:Sleep Apnea and Dyslipidemia.  Sonographer:    Ronny Flurry Referring Phys: [id number] TIMOTHY S OPYD  Sonographer Comments: No subcostal window. IMPRESSIONS  1. Left ventricular ejection fraction, by estimation, is 40 to 45%. The left ventricle has mildly decreased function. The left ventricle demonstrates global hypokinesis. Left ventricular diastolic parameters are consistent with Grade I diastolic dysfunction (impaired relaxation).  2. Right ventricular systolic function is normal. The right ventricular size is normal.  3. The mitral valve is normal in structure. No evidence of mitral valve regurgitation.  4. Suspect bicuspid aortic valve. There is mild low flow-low gradient aortic stenosis (gradients are underestimated due to low stroke volume). The aortic valve is bicuspid. There is mild calcification of the aortic valve. There is moderate thickening of  the aortic valve. Aortic valve regurgitation is not visualized. Mild aortic valve stenosis. Aortic valve mean gradient measures 7.0 mmHg. Aortic valve Vmax measures 1.78 m/s.  5. Aortic dilatation noted. There is moderate dilatation of the aortic root, measuring 45 mm. Comparison(s): Prior images unable to be directly viewed, comparison made by report  only. The aortic valve is identified as bicuspid, otherwise no change. FINDINGS  Left Ventricle: Left ventricular ejection fraction, by estimation, is 40 to 45%. The left ventricle has mildly decreased function. The left ventricle demonstrates global hypokinesis. The left ventricular internal cavity size was normal in size. There is  borderline concentric left ventricular hypertrophy. Left ventricular diastolic parameters are consistent with Grade I diastolic dysfunction (impaired relaxation). Normal left ventricular filling pressure. Right Ventricle: The right ventricular size is normal. No increase in right ventricular wall thickness. Right ventricular systolic function is normal. Left Atrium: Left atrial size was normal in size. Right Atrium: Right atrial size was normal in size. Pericardium: There is no evidence of pericardial effusion. Mitral Valve: The mitral valve is normal in structure. No evidence of mitral valve regurgitation.  Tricuspid Valve: The tricuspid valve is normal in structure. Tricuspid valve regurgitation is not demonstrated. Aortic Valve: Suspect bicuspid aortic valve. There is mild low flow-low gradient aortic stenosis (gradients are underestimated due to low stroke volume). The aortic valve is bicuspid. There is mild calcification of the aortic valve. There is moderate thickening of the aortic valve. Aortic valve regurgitation is not visualized. Mild aortic stenosis is present. Aortic valve mean gradient measures 7.0 mmHg. Aortic valve peak gradient measures 12.7 mmHg. Aortic valve area, by VTI measures 1.97 cm. Pulmonic Valve: The pulmonic valve was grossly normal. Pulmonic valve regurgitation is not visualized. Aorta: Aortic dilatation noted. There is moderate dilatation of the aortic root, measuring 45 mm. IAS/Shunts: No atrial level shunt detected by color flow Doppler.  LEFT VENTRICLE PLAX 2D LVIDd:         4.60 cm LVIDs:         3.20 cm LV PW:         1.10 cm LV IVS:        1.20 cm  LVOT diam:     2.60 cm LV SV:         60 LV SV Index:   28 LVOT Area:     5.31 cm  RIGHT VENTRICLE RV S prime:     12.90 cm/s LEFT ATRIUM             Index        RIGHT ATRIUM           Index LA diam:        3.60 cm 1.68 cm/m   RA Area:     15.70 cm LA Vol (A2C):   39.7 ml 18.49 ml/m  RA Volume:   31.45 ml  14.65 ml/m LA Vol (A4C):   31.0 ml 14.44 ml/m LA Biplane Vol: 36.5 ml 17.00 ml/m  AORTIC VALVE AV Area (Vmax):    2.01 cm AV Area (Vmean):   1.93 cm AV Area (VTI):     1.97 cm AV Vmax:           178.50 cm/s AV Vmean:          121.500 cm/s AV VTI:            0.302 m AV Peak Grad:      12.7 mmHg AV Mean Grad:      7.0 mmHg LVOT Vmax:         67.42 cm/s LVOT Vmean:        44.100 cm/s LVOT VTI:          0.112 m LVOT/AV VTI ratio: 0.37  AORTA Ao Root diam: 4.55 cm Ao Asc diam:  4.20 cm MITRAL VALVE MV Area (PHT): 3.77 cm    SHUNTS MV Decel Time: 201 msec    Systemic VTI:  0.11 m MV E velocity: 38.00 cm/s  Systemic Diam: 2.60 cm MV A velocity: 75.10 cm/s MV E/A ratio:  0.51 Mihai Croitoru MD Electronically signed by Sanda Klein MD Signature Date/Time: 07/29/2022/4:13:22 PM    Final     PHYSICAL EXAM  Temp:  [97.8 F (36.6 C)-98.7 F (37.1 C)] 98.1 F (36.7 C) (03/27 1132) Pulse Rate:  [79-105] 94 (03/27 1132) Resp:  [18] 18 (03/27 1132) BP: (108-127)/(65-87) 108/87 (03/27 1132) SpO2:  [92 %-97 %] 96 % (03/27 1132)  General - Well nourished, well developed caucasian male, in no apparent distress. Swelling noted to occipital region, wife states this has improved since surgery.  Cardiovascular - Regular  rhythm and rate.  Mental status/Cognition: Alert, oriented to self, place, month and year, good attention.  Speech/language: significantly dysarthric scanning cerebellar speech, non fluent, comprehension intact, object naming intact, repetition intact.  Cranial Nerves II - XII - II - Visual field intact OU. III, IV, VI - Extraocular movements intact, slightly slower to the right, no  nystagmus. V - Facial sensation intact bilaterally. VII - Facial movement intact bilaterally. VIII - Hearing intact to voice X - Palate elevates symmetrically. XI - Chin turning & shoulder shrug intact bilaterally. XII - Tongue protrusion midline  Motor Strength - The patient's strength was normal in all extremities and pronator drift was absent.  Bulk was normal and fasciculations were absent.   Motor Tone - Muscle tone was assessed at the neck and appendages and was normal.  Sensory - Light touch assessed and symmetrical.    Coordination - The patient had normal movements in the hands and feet with no ataxia or dysmetria.  Tremor was absent. Likely truncal ataxia from the description of symptoms.   Gait and Station - deferred.  As patient complains of truncal ataxia  ASSESSMENT/PLAN [name] is a 62 y.o. male with PMH significant for CAD, OSA, HLD, cerebellar AVM s/p posterior fossa craniotomy for resection in Dec 2023 and then recent operative repair of pseudomeningiocele and just discharged on Friday who presents with slurred speech, worsening gait. His neurologic examination is notable for truncal ataxia. MRI read states acute on likely chronic R superior cerebellar infarct. However, in comparing the most recent MRI to the one post-op, it is hard to tell if this is a stroke or is a deficit from the fluid shift/pressure changes occurred post-op/post-drain removal.   Timeline of is symptoms is somewhat unclear as symptoms were noted after surgery back in December and more recently after second surgery on March 15th. Wife states that the symptoms have gotten worse since going home, but were initially present while in the hospital after the surgery.    ? R cerebellar infarct versus post-op changes from recent surgery x 2 Etiology:  pending testing   CT 3/25: Similar postoperative appearance from prior suboccipital craniectomy for resection of the posterior fossa  arteriovenous malformation. New pneumocephalus in the resection cavity, likely secondary to recent pseudomeningocele repair and lumbar drain placement/removal. No acute intracranial hemorrhage. Partially imaged fluid collection in the dorsal soft tissues overlying the suboccipital craniectomy, likely residual or recurrent pseudomeningocele. MRI  3/25: New small focus of acute infarction in the right superior cerebellar hemisphere with surrounding area of increased T2 signal and cystic change, which is new since the prior MRI dated 07/05/2022 and suspicious for now chronic infarct. Postoperative changes of prior left posterior fossa resection. Redemonstrated foci of gas within the resection cavity, fourth ventricle and extra-axial spaces, likely secondary to recent pseudomeningocele repair and/or lumbar drain placement/removal. Decreased size of the fluid collection in the dorsal soft tissues overlying the suboccipital craniectomy Neurosurgery consulted: Agree that small DWI hyperintensity is not typical of stroke in morphology, distribution or intensity and is more likely postoperative change. Recommends  steroids-- dexamethasone 4 mg q 8 x 3 days, tapering by half every 3 days.   2D Echo: Ejection fraction 40 to 45%.  Global hypokinesis.  Suspect bicuspid aortic valve.  Moderate aortic root dilatation measuring 45 mm  LDL 57 HgbA1c No results found for requested labs within last 1095 days. VTE prophylaxis - lovenox    Diet   DIET DYS 2 Room service appropriate?  Yes; Fluid consistency: Nectar Thick   aspirin 81 mg daily prior to admission, now on aspirin 81 mg daily.  Therapy recommendations:  pending Disposition:  pending  Hypertension Home meds: Metoprolol 25 mg, lisinopril 10 mg Permissive hypertension (OK if < 200/120) but gradually normalize in 1-2 days Long-term BP goal normotensive  Hyperlipidemia Home meds: Crestor 20 mg, resumed in hospital LDL 57, goal < 70 Continue statin at  discharge   Other Stroke Risk Factors Former Cigarette smoker Coronary artery disease Obstructive sleep apnea Congestive heart failure  Other Active Problems Depression Rheumatoid Arthritis  Hospital day # 1    Patient underwent posterior fossa surgery for left cerebellar AVM resection initially and then had pseudomeningocele excision last week following which she has had some slurred speech and gait ataxia which got worse prompting this admission.  MRI shows weak diffusion positive lesion in the right superior cerebellum but given other abnormalities including a new CSF cyst collection and FLAIR hyperintensities these likely represent postoperative changes rather than a true cerebellar infarct.  Case discussed with neurosurgeon Dr. Marcello Moores who agrees.  Continue Short course of steroids to see if it helps.  Continue ongoing stroke workup.  Physical Occupational Therapy consults and patient would likely benefit from inpatient rehab stay.  Continue aspirin 81 mg daily for now and aggressive risk factor modification.  Long discussion with patient and wife at the bedside and answered questions.  Discussed with Dr.Krishnan.  Stroke team will sign off.  Kindly call for questions     greater than 50% time during this 35 minute visit were spent in counseling and coordination of care about his abnormal MRI and discussion about evaluation and treatment plan and answering questions.  Antony Contras, MD Medical Director [facility] Pager: [phone] 07/30/2022 12:34 PM  To contact Stroke Continuity provider, please refer to [email or web address] After hours, contact General Neurology

## 2022-07-30 NOTE — Progress Notes (Signed)
[facility] Progress Note Patient: Christopher Yu [id number] DOB: 09/13/1960 DOA: 07/28/2022  DOS: the patient was seen and examined on 07/30/2022  Brief hospital course: Patient is a 62 year old with past medical history of rheumatoid arthritis, CAD, OSA with CPAP intolerance, systolic/diastolic heart failure with ejection fraction of 40 to 45%, depression and recent repair of pseudomeningocele on 3/15 (discharged on 3/22) who presented to the emergency department on 3/25 dysarthria and gait difficulty.    Admitted to hospitalist service with neurology and neurosurgery consult.  MRI brain on admission noted improvement in small pseudomeningocele and no significant hemorrhage in the surgical cavity.  There is some right cerebellar flair noted which may be affecting patient's language.  Neurosurgery felt no need for intervention, but would benefit from steroids and patient started on dexamethasone.  Patient seen by physical and Occupational Therapy who recommended inpatient rehab of which consult is pending.  Patient seen by speech therapy put him on dysphagia 2 diet with nectar thick liquids. Assessment and Plan: Dysarthria and gait ataxia:  Appreciate neurosurgery and neurology help.  Stroke workup unrevealing.  A1c and LDL unremarkable.  Neurology recommends starting aspirin.  Neurosurgery with steroid taper. Awaiting inpatient reach evaluation  Prediabetes A1c at 6.0.  Dysphagia Seen by speech therapy and put on restricted diet.  They will continue to follow patient to inpatient rehab  History of left cerebellar AVM resection 12/23. Pseudomeningocele repair and lumbar drain placement on 3/15. Appreciate neurosurgery consultation. Presentation most likely appear to be postop changes. Currently on Decadron with taper  OSA. Noncompliance with CPAP. Placing the patient at high risk of poor outcome.  Chronic systolic/diastolic heart failure Echocardiogram notes  ejection fraction of 40 to 45% and grade 1 diastolic dysfunction.  Looks to be euvolemic, but will check BNP.  Continue aspirin  Hyperlipidemia On Crestor continue.  Depression. Stable, continue Zoloft  History of rheumatoid arthritis. On methotrexate, sulfasalazine, rituximab. Currently holding on medication. Receiving steroids.  Patient was on gabapentin. Currently on hold.   Subjective: No complaints other than continued weakness and some difficulty in speech  Physical Exam: General: Alert and oriented x 3, no acute distress Cardiovascular: Regular rate and rhythm, S1-S2 Respiratory: Clear to auscultation bilaterally Abdomen: Soft nontender, nondistended, positive bowel sounds Extremities: No clubbing or cyanosis or edema Neuro: Dysarthric speech, but no focal deficits  Data Reviewed: Hemoglobin of 10.7 with MCV of 75  Disposition: Status is: Inpatient Remains inpatient appropriate because:  -Continue IV steroids -Waiting inpatient rehab evaluation  enoxaparin (LOVENOX) injection 40 mg Start: 07/28/22 2230   Family Communication: Wife at bedside Level of care: Telemetry Medical   Vitals:   07/29/22 2330 07/30/22 0322 07/30/22 0745 07/30/22 1132  BP: 127/78 120/65 108/80 108/87  Pulse: 92 (!) 105 83 94  Resp: 18 18 18 18   Temp: 98.1 F (36.7 C) 98.7 F (37.1 C) 97.9 F (36.6 C) 98.1 F (36.7 C)  TempSrc: Oral Oral Oral Oral  SpO2: 92% 93% 97% 96%  Weight:      Height:         Author: Annita Brod, MD 07/30/2022 1:31 PM  Please look on [email or web address] to find out who is on call.

## 2022-07-30 NOTE — Consult Note (Signed)
Physical Medicine and Rehabilitation Consult Reason for Consult:Rehab Referring Physician: Dr. Maryland Pink    HPI: Christopher Yu is a 62 y.o. male with past medical history of CAD, OSA, depression, rheumatoid arthritis, chronic systolic CHF, prior AVM resection December 2023 and pseudomeningocele repair on 07/18/2022 who presented to the ER on 07/28/2022 for altered gait and dysarthria or worsening for about 3 days prior to admission.  Head CT on 07/28/2022 showed similar postoperative appearance of prior suboccipital craniectomy, new pneumocephalus and resection cavity.  MRI brain 07/28/2022 showed new small focus of acute infarction right superior cerebellar hemisphere.  He was seen by Dr. Duffy Rhody neurosurgery and neurology Dr. Leonie Man.  Neurosurgery reports no neurosurgical intervention is indicated.  He was noted to have some increased cerebellar FLAIR signal indicating possible cerebellar dysfunction.  Neurology and neurosurgery feel that small DWI hyperintensity not felt to be typical of stroke and more likely to be postoperative change.  He was started on steroids with dexamethasone taper.  He was seen by speech therapist and started on dysphagia 2/nectar diet.  He has been continued on aspirin 81 mg daily and aggressive risk factor modification recommended.  Patient reported to have compliance with CPAP.  Echocardiogram with EF 40 to 45% and grade 1 diastolic dysfunction.  Patient seen by PT and OT who feel that patient would benefit from CIR.   Review of Systems  Constitutional:  Negative for chills and fever.  Eyes:  Negative for double vision.  Respiratory:  Negative for shortness of breath.   Cardiovascular:  Negative for chest pain.  Gastrointestinal:  Negative for abdominal pain, nausea and vomiting.  Genitourinary: Negative.   Musculoskeletal:  Negative for joint pain.  Skin:  Negative for rash.  Neurological:  Positive for speech change and weakness.    Past Medical History:  Diagnosis Date   Anxiety    Arthritis, rheumatoid ([place])    dx 1988   CAD (coronary artery disease), native coronary artery    Mild LAD disease with calcification noted at cath 12//27/16    Cardiomyopathy ([place])    Cardiomyopathy ([place])    Chronic systolic heart failure ([place]) 05/01/2015   Depression    Deviated nasal septum 06/25/2011   Headache    High cholesterol    Hyperlipidemia    Impingement syndrome of left shoulder 12/22/2017   Macular degeneration    Rheumatoid aortitis    Sleep apnea    does not use cpap - UNABLE TO TOLERATE MASK   Sleep apnea in adult    deviated septum repaired, most recent sleep study was negative   Status post total right knee replacement 06/01/2015   Past Surgical History:  Procedure Laterality Date   ANKLE FUSION Right 05/05/2012   related to his arthritis   ANKLE FUSION Left 05/25/2013   related to his arthritis   ANKLE SURGERY Bilateral    APPLICATION OF CRANIAL NAVIGATION N/A 04/11/2022   Procedure: APPLICATION OF CRANIAL NAVIGATION;  Surgeon: Consuella Lose, MD;  Location: [place];  Service: Neurosurgery;  Laterality: N/A;   CARDIAC CATHETERIZATION N/A 05/01/2015   Procedure: Left Heart Cath and Coronary Angiography;  Surgeon: Belva Crome, MD;  Location: [facility];  Service: Cardiovascular;  Laterality: N/A;   CRANIOTOMY N/A 04/11/2022   Procedure: STEREOTACTIC SUBOCCIPITAL CRANIOTOMY FOR RESECTION OF ARTERIO-VENOUS MALFORMATION;  Surgeon: Consuella Lose, MD;  Location: [place];  Service: Neurosurgery;  Laterality: N/A;   FRACTURE SURGERY  left femur fracture x 3   IR ANGIO EXTERNAL CAROTID SEL EXT CAROTID BILAT MOD SED  01/28/2022   IR ANGIO INTRA EXTRACRAN SEL INTERNAL CAROTID BILAT MOD SED  01/28/2022   IR ANGIO INTRA EXTRACRAN SEL INTERNAL CAROTID BILAT MOD SED  04/16/2022   IR ANGIO VERTEBRAL SEL VERTEBRAL BILAT MOD SED  01/28/2022   IR ANGIO VERTEBRAL SEL VERTEBRAL BILAT MOD SED  04/16/2022    KNEE ARTHROSCOPY     right x 4   LUMBAR LAMINECTOMY/DECOMPRESSION MICRODISCECTOMY N/A 07/18/2022   Procedure: Repair of Pseudomeningiocele posterior;  Surgeon: Consuella Lose, MD;  Location: [place];  Service: Neurosurgery;  Laterality: N/A;   NASAL SEPTOPLASTY W/ TURBINOPLASTY  06/25/2011   Procedure: NASAL SEPTOPLASTY WITH TURBINATE REDUCTION;  Surgeon: Jerrell Belfast, MD;  Location: [place];  Service: ENT;  Laterality: Bilateral;   PLACEMENT OF LUMBAR DRAIN N/A 07/18/2022   Procedure: PLACEMENT OF LUMBAR DRAIN;  Surgeon: Consuella Lose, MD;  Location: [facility];  Service: Neurosurgery;  Laterality: N/A;   TONSILLECTOMY     TOTAL KNEE ARTHROPLASTY Right 06/01/2015   Procedure: RIGHT TOTAL KNEE ARTHROPLASTY;  Surgeon: Mcarthur Rossetti, MD;  [facility];  Service: Orthopedics;  Laterality: Right;  Block+general   Family History  Problem Relation Age of Onset   Lupus Sister    Social History:  reports that he quit smoking about 14 years ago. His smoking use included cigarettes. He has a 10.00 pack-year smoking history. He has never used smokeless tobacco. He reports current alcohol use. He reports that he does not use drugs. Allergies: No Known Allergies Medications Prior to Admission  Medication Sig Dispense Refill   aspirin 81 MG tablet Take 81 mg by mouth daily.     CALCIUM PO Take 1 tablet by mouth daily.     Cholecalciferol (VITAMIN D3) 50 MCG (2000 UT) TABS Take 2,000 Units by mouth daily.     diazepam (VALIUM) 5 MG tablet Take 1 tablet (5 mg total) by mouth every 8 (eight) hours as needed for muscle spasms. 30 tablet 0   fluticasone (FLONASE) 50 MCG/ACT nasal spray Place 1 spray into both nostrils daily as needed for allergies or rhinitis.     gabapentin (NEURONTIN) 300 MG capsule Take 300 mg by mouth 2 (two) times daily.     methotrexate (RHEUMATREX) 2.5 MG tablet Take 20 mg by mouth once a week. Takes 8 tablets(20mg ) every Saturday.     Caution:Chemotherapy. Protect  from light.     metoprolol succinate (TOPROL-XL) 25 MG 24 hr tablet TAKE 1 TABLET DAILY 90 tablet 2   Multiple Vitamins-Minerals (PRESERVISION AREDS 2 PO) Take 2 capsules by mouth daily.     predniSONE (DELTASONE) 5 MG tablet Take 20 mg by mouth daily as needed (Arthritis).     rosuvastatin (CRESTOR) 20 MG tablet Take 1 tablet (20 mg total) by mouth daily. 90 tablet 3   sertraline (ZOLOFT) 50 MG tablet Take 50 mg by mouth every morning.     spironolactone (ALDACTONE) 25 MG tablet TAKE 1 TABLET DAILY (Patient taking differently: Take 12.5 mg by mouth daily.) 90 tablet 2   sulfaSALAzine (AZULFIDINE) 500 MG tablet Take 1,000 mg by mouth 2 (two) times daily.      traMADol (ULTRAM) 50 MG tablet Take 50 mg by mouth every 6 (six) hours as needed for moderate pain or severe pain.     clindamycin (CLEOCIN) 150 MG capsule Take 600 mg by mouth See admin instructions. Take 600mg  by mouth an  hour prior to dental appointment.     HYDROcodone-acetaminophen (NORCO/VICODIN) 5-325 MG tablet Take 1 tablet by mouth every 4 (four) hours as needed for up to 7 days for moderate pain. (Patient not taking: Reported on 07/30/2022) 30 tablet 0   lisinopril (ZESTRIL) 10 MG tablet TAKE 1 TABLET DAILY (Patient not taking: Reported on 05/09/2022) 90 tablet 3   Propylene Glycol (SYSTANE COMPLETE) 0.6 % SOLN Place 1 drop into both eyes 2 (two) times daily.     RITUXAN 500 MG/50ML injection See admin instructions. Every 6 months 2 dose infusion Ruxience (Patient not taking: Reported on 04/22/2022)      Home: [place] expects to be discharged to:: Private residence Living Arrangements: Spouse/significant other, Children Available Help at Discharge: Available PRN/intermittently (spouse works 7-12:30 typically) Type of Home: House Home Access: Stairs to enter Technical brewer of Steps: 3 Entrance Stairs-Rails: Right [place]: Two level, Able to live on main level with bedroom/bathroom Alternate Level  Stairs-Number of Steps: flight Bathroom Shower/Tub: Multimedia programmer: Handicapped height Bathroom Accessibility: Yes Home Equipment: Conservation officer, nature (2 wheels), [place] - single point, Grab bars - toilet  Functional History: Prior Function Prior Level of Function : Needs assist Mobility Comments: independent with mobility prior to initial surgery in december. Has been ambulating with RW since surgery, multiple falls. Was ambulating with RW and assistance from spouse at all times since recent discharge on 3/22. ADLs Comments: assistance with all ADLs since discharge last week. Independent prior to initial surgery in December Functional Status:  Mobility: Bed Mobility Overal bed mobility: Needs Assistance Bed Mobility: Supine to Sit, Sit to Supine Supine to sit: Supervision Sit to supine: Supervision General bed mobility comments: No physical assist required Transfers Overall transfer level: Needs assistance Equipment used: Rolling walker (2 wheels) Transfers: Sit to/from Stand Sit to Stand: Min assist General transfer comment: Increased time to rise, light minA to power up to walker Ambulation/Gait Ambulation/Gait assistance: Min assist, Mod assist Gait Distance (Feet): 100 Feet Assistive device: Rolling walker (2 wheels) Gait Pattern/deviations: Decreased step length - left, Ataxic, Trunk flexed, Step-to pattern General Gait Details: Verbal cues for increased L step length, upright posture, environmental/obstacle negotiation. Pt requiring min assist overall for balance, regressing to modA with fatigue and increased L lateral lean in standing Gait velocity: reduced Gait velocity interpretation: <1.31 ft/sec, indicative of household ambulator    ADL: ADL Overall ADL's : Needs assistance/impaired Eating/Feeding: Independent, Sitting Grooming: Sitting, Min guard Upper Body Bathing: Min guard, Sitting Upper Body Bathing Details (indicate cue type and reason):  Pt spouse reports sponge bathing recently PTA Lower Body Bathing: Sitting/lateral leans, Moderate assistance Lower Body Bathing Details (indicate cue type and reason): Pt spouse reports sponge bathing recently PTA Upper Body Dressing : Min guard, Sitting Lower Body Dressing: Moderate assistance, Sitting/lateral leans Toilet Transfer: Rolling walker (2 wheels), Ambulation, Moderate assistance Toileting- Clothing Manipulation and Hygiene: Moderate assistance, Sit to/from stand Tub/ Shower Transfer: Moderate assistance, Rolling walker (2 wheels), Ambulation Functional mobility during ADLs: Rolling walker (2 wheels), Min guard General ADL Comments: Lateral stepping at bedside  Cognition: Cognition Overall Cognitive Status: Impaired/Different from baseline Orientation Level: Oriented X4 Cognition Arousal/Alertness: Awake/alert Behavior During Therapy: Flat affect Overall Cognitive Status: Impaired/Different from baseline Area of Impairment: Problem solving, Awareness Awareness: Emergent Problem Solving: Requires verbal cues  Blood pressure 108/87, pulse 94, temperature 98.1 F (36.7 C), temperature source Oral, resp. rate 18, height 6\' 1"  (1.854 m), weight 90.3 kg, SpO2 96 %.  Physical Exam  General: NAD, lying in bed HEENT: Head with occipital swelling noted, PERRLA, EOMI, sclera anicteric, oral mucosa pink and moist, dentition intact, ext ear canals clear,  Neck: Supple without JVD or lymphadenopathy Heart: Reg rate and rhythm. No murmurs rubs or gallops Chest: CTA bilaterally without wheezes, rales, or rhonchi; no distress Abdomen: Soft, non-tender, non-distended, bowel sounds positive. Extremities: No clubbing, cyanosis, or edema. Pulses are 2+ Psych: Pt's affect is appropriate. Pt is cooperative Skin: Clean and intact without signs of breakdown Neuro: Alert and oriented x 4, follows commands, dysarthria present, comprehension overall intact, repetition intact, able to name 3  objects, cranial nerves II through XII intact Strength 5 out of 5 in bilateral upper extremities Strength 4+ out of 5 in proximal lower extremities, 5 out of 5 in distal lower extremities Sensation intact light touch in all 4 extremities No pronator drift FTN altered L>R HTS b/l slow to complete Musculoskeletal: Normal bulk and tone, no joint swelling noted Healed right TKA incision  Results for orders placed or performed during the hospital encounter of 07/28/22 (from the past 24 hour(s))  Basic metabolic panel     Status: Abnormal   Collection Time: 07/30/22  4:01 AM  Result Value Ref Range   Sodium 139 135 - 145 mmol/L   Potassium 3.7 3.5 - 5.1 mmol/L   Chloride 99 98 - 111 mmol/L   CO2 28 22 - 32 mmol/L   Glucose, Bld 120 (H) 70 - 99 mg/dL   BUN 9 6 - 20 mg/dL   Creatinine, Ser 0.78 0.61 - 1.24 mg/dL   Calcium 9.2 8.9 - 10.3 mg/dL   GFR, Estimated >60 >60 mL/min   Anion gap 12 5 - 15  CBC     Status: Abnormal   Collection Time: 07/30/22  4:01 AM  Result Value Ref Range   WBC 7.7 4.0 - 10.5 K/uL   RBC 4.66 4.22 - 5.81 MIL/uL   Hemoglobin 10.7 (L) 13.0 - 17.0 g/dL   HCT 34.7 (L) 39.0 - 52.0 %   MCV 74.5 (L) 80.0 - 100.0 fL   MCH 23.0 (L) 26.0 - 34.0 pg   MCHC 30.8 30.0 - 36.0 g/dL   RDW 18.6 (H) 11.5 - 15.5 %   Platelets 231 150 - 400 K/uL   nRBC 0.0 0.0 - 0.2 %  Magnesium     Status: None   Collection Time: 07/30/22  4:01 AM  Result Value Ref Range   Magnesium 1.9 1.7 - 2.4 mg/dL   DG Swallowing Func-Speech Pathology  Result Date: 07/30/2022 Table formatting from the original result was not included. Modified Barium Swallow Study Patient Details Name: Jaggar Zaborski MRN: [id number] Date of Birth: 03-Oct-1960 Today's Date: 07/30/2022 HPI/PMH: HPI: 62 y.o. male [facility] to [facility] hospital on [facility]22 with slurred speech, worsening gait and HA. Pt recently discharged 07/25/2022 after operative repair of pseudomeningiocele. MRI brain 3/25 demonstrates acute R  cerebellar infarct. PMH: for CAD, R TKA, cardiomyopathy, chronic systolic heart failure, macular degeneration. Clinical Impression: Clinical Impression: Pt demonstrated minimal oral dysphagia with trace lingual residue present intermittently and mild pharyngeal dysphagia. Pt exhibited decreased laryngeal elevation with thin barium entering airway prior to full laryngeal closure resulting in penetration remaining on the anterior portion of vestibule. A chin tuck posture was not effective and he continued to penetrate with spontaeous cough that expelled penetrates. A head turn to left with thin resulted in sensed aspiration with weak cough. Nectar thick was also penetrated during and  after the swallow and chin tuck was ineffective however a head turn to the left was 90% effective to prevent penetration over multiple trials. Reduced tonuge base retraction led to minimal vallecular residue consistently. Pt was able to masticate solid texture with flash penetration. Given deconditioning recommend he start a more conservative diet texture of Dys 2 (minced), nectar thick with head turn to the left with liquids, intermittent cough/throat clear, no straws, small sips and initiate meds crushed in puree (can advance to whole in puree as able). Factors that may increase risk of adverse event in presence of aspiration ([place] 2021): No data recorded Recommendations/Plan: Swallowing Evaluation Recommendations Swallowing Evaluation Recommendations Recommendations: PO diet PO Diet Recommendation: Dysphagia 2 (Finely chopped); Mildly thick liquids (Level 2, nectar thick) Liquid Administration via: Cup; No straw Medication Administration: Crushed with puree Supervision: Staff to assist with self-feeding; Full supervision/cueing for swallowing strategies Swallowing strategies  : Head turn left during swallowing (with liquids) Postural changes: Position pt fully upright for meals Oral care recommendations: Oral care BID  (2x/day) Treatment Plan Treatment Plan Treatment recommendations: Therapy as outlined in treatment plan below Follow-up recommendations: Acute inpatient rehab (3 hours/day) Functional status assessment: Patient has had a recent decline in their functional status and demonstrates the ability to make significant improvements in function in a reasonable and predictable amount of time. Treatment frequency: Min 2x/week Treatment duration: 2 weeks Interventions: Aspiration precaution training; Patient/family education; Compensatory techniques; Trials of upgraded texture/liquids Recommendations Recommendations for follow up therapy are one component of a multi-disciplinary discharge planning process, led by the attending physician.  Recommendations may be updated based on patient status, additional functional criteria and insurance authorization. Assessment: Orofacial Exam: Orofacial Exam Oral Cavity: Oral Hygiene: WFL Oral Cavity - Dentition: Adequate natural dentition Orofacial Anatomy: Other (comment) Oral Motor/Sensory Function: Suspected cranial nerve impairment CN V - Trigeminal: WFL CN VII - Facial: Left motor impairment CN IX - Glossopharyngeal, CN X - Vagus: Not tested Anatomy: Anatomy: WFL Boluses Administered: Boluses Administered Boluses Administered: Thin liquids (Level 0); Mildly thick liquids (Level 2, nectar thick); Moderately thick liquids (Level 3, honey thick); Puree; Solid  Oral Impairment Domain: Oral Impairment Domain Lip Closure: No labial escape Tongue control during bolus hold: Cohesive bolus between tongue to palatal seal Bolus preparation/mastication: Timely and efficient chewing and mashing Bolus transport/lingual motion: Brisk tongue motion Oral residue: Trace residue lining oral structures Location of oral residue : Tongue Initiation of pharyngeal swallow : Pyriform sinuses  Pharyngeal Impairment Domain: Pharyngeal Impairment Domain Soft palate elevation: No bolus between soft palate  (SP)/pharyngeal wall (PW) Laryngeal elevation: Partial superior movement of thyroid cartilage/partial approximation of arytenoids to epiglottic petiole Anterior hyoid excursion: Partial anterior movement Epiglottic movement: Complete inversion Laryngeal vestibule closure: Incomplete, narrow column air/contrast in laryngeal vestibule Pharyngeal stripping wave : Present - complete Pharyngeal contraction (A/P view only): N/A Pharyngoesophageal segment opening: Complete distension and complete duration, no obstruction of flow Tongue base retraction: Trace column of contrast or air between tongue base and PPW Pharyngeal residue: Collection of residue within or on pharyngeal structures Location of pharyngeal residue: Valleculae  Esophageal Impairment Domain: Esophageal Impairment Domain Esophageal clearance upright position: Complete clearance, esophageal coating Pill: Esophageal Impairment Domain Esophageal clearance upright position: Complete clearance, esophageal coating Penetration/Aspiration Scale Score: Penetration/Aspiration Scale Score 2.  Material enters airway, remains ABOVE vocal cords then ejected out: Thin liquids (Level 0) 3.  Material enters airway, remains ABOVE vocal cords and not ejected out: Thin liquids (Level 0) 4.  Material  enters airway, CONTACTS cords then ejected out: Mildly thick liquids (Level 2, nectar thick) Compensatory Strategies: Compensatory Strategies Compensatory strategies: Yes Chin tuck: Ineffective Ineffective Chin Tuck: Thin liquid (Level 0); Mildly thick liquid (Level 2, nectar thick) Left head turn: Effective (ineffective with thin, effective with nectar) Effective Left Head Turn: Mildly thick liquid (Level 2, nectar thick) Ineffective Left Head Turn: Thin liquid (Level 0)   General Information: Caregiver present: No  Diet Prior to this Study: NPO   Temperature : Normal   Respiratory Status: WFL   Supplemental O2: None (Room air)   History of Recent Intubation: No   Behavior/Cognition: Alert; Cooperative; Pleasant mood Self-Feeding Abilities: Able to self-feed Baseline vocal quality/speech: Hypophonia/low volume Volitional Cough: Able to elicit Volitional Swallow: Able to elicit No data recorded Goal Planning: Prognosis for improved oropharyngeal function: Good No data recorded No data recorded No data recorded Consulted and agree with results and recommendations: Patient; Family member/caregiver Pain: Pain Assessment Pain Assessment: No/denies pain Pain Score: 5 Faces Pain Scale: 4 Pain Location: Headache Pain Descriptors / Indicators: Grimacing Pain Intervention(s): Monitored during session End of Session: Start Time:SLP Start Time (ACUTE ONLY): 1038 Stop Time: SLP Stop Time (ACUTE ONLY): 1051 Time Calculation:SLP Time Calculation (min) (ACUTE ONLY): 13 min Charges: SLP Evaluations $ SLP Speech Visit: 1 Visit SLP Evaluations $BSS Swallow: 1 Procedure $MBS Swallow: 1 Procedure SLP visit diagnosis: SLP Visit Diagnosis: Dysphagia, oropharyngeal phase (R13.12) Past Medical History: Past Medical History: Diagnosis Date  Anxiety   Arthritis, rheumatoid ([place])   dx 1988  CAD (coronary artery disease), native coronary artery   Mild LAD disease with calcification noted at cath 12//27/16   Cardiomyopathy ([place])   Cardiomyopathy ([place])   Chronic systolic heart failure ([place]) 05/01/2015  Depression   Deviated nasal septum 06/25/2011  Headache   High cholesterol   Hyperlipidemia   Impingement syndrome of left shoulder 12/22/2017  Macular degeneration   Rheumatoid aortitis   Sleep apnea   does not use cpap - UNABLE TO TOLERATE MASK  Sleep apnea in adult   deviated septum repaired, most recent sleep study was negative  Status post total right knee replacement 06/01/2015 Past Surgical History: Past Surgical History: Procedure Laterality Date  ANKLE FUSION Right 05/05/2012  related to his arthritis  ANKLE FUSION Left 05/25/2013  related to his arthritis  ANKLE SURGERY Bilateral   APPLICATION OF  CRANIAL NAVIGATION N/A [id number]  Procedure: APPLICATION OF CRANIAL NAVIGATION;  Surgeon: Consuella Lose, MD;  Location: [place];  Service: Neurosurgery;  Laterality: N/A;  CARDIAC CATHETERIZATION N/A 05/01/2015  Procedure: Left Heart Cath and Coronary Angiography;  Surgeon: Belva Crome, MD;  Location: [facility];  Service: Cardiovascular;  Laterality: N/A;  CRANIOTOMY N/A 04/11/2022  Procedure: STEREOTACTIC SUBOCCIPITAL CRANIOTOMY FOR RESECTION OF ARTERIO-VENOUS MALFORMATION;  Surgeon: Consuella Lose, MD;  Location: [place];  Service: Neurosurgery;  Laterality: N/A;  FRACTURE SURGERY    left femur fracture x 3  IR ANGIO EXTERNAL CAROTID SEL EXT CAROTID BILAT MOD SED  01/28/2022  IR ANGIO INTRA EXTRACRAN SEL INTERNAL CAROTID BILAT MOD SED  01/28/2022  IR ANGIO INTRA EXTRACRAN SEL INTERNAL CAROTID BILAT MOD SED  04/16/2022  IR ANGIO VERTEBRAL SEL VERTEBRAL BILAT MOD SED  01/28/2022  IR ANGIO VERTEBRAL SEL VERTEBRAL BILAT MOD SED  04/16/2022  KNEE ARTHROSCOPY    right x 4  LUMBAR LAMINECTOMY/DECOMPRESSION MICRODISCECTOMY N/A 07/18/2022  Procedure: Repair of Pseudomeningiocele posterior;  Surgeon: Consuella Lose, MD;  Location: [place];  Service: Neurosurgery;  Laterality:  N/A;  NASAL SEPTOPLASTY W/ TURBINOPLASTY  06/25/2011  Procedure: NASAL SEPTOPLASTY WITH TURBINATE REDUCTION;  Surgeon: Jerrell Belfast, MD;  Location: [place];  Service: ENT;  Laterality: Bilateral;  PLACEMENT OF LUMBAR DRAIN N/A 07/18/2022  Procedure: PLACEMENT OF LUMBAR DRAIN;  Surgeon: Consuella Lose, MD;  Location: [place];  Service: Neurosurgery;  Laterality: N/A;  TONSILLECTOMY    TOTAL KNEE ARTHROPLASTY Right 06/01/2015  Procedure: RIGHT TOTAL KNEE ARTHROPLASTY;  Surgeon: Mcarthur Rossetti, MD;  [facility];  Service: Orthopedics;  Laterality: Right;  Block+general [place] Si[place] 07/30/2022, 12:27 PM  ECHOCARDIOGRAM COMPLETE  Result Date: 07/29/2022    ECHOCARDIOGRAM REPORT   Patient Name:   JACQUE VIRGINIA Date of Exam: 07/29/2022 Medical Rec #:  [id number]                   Height:       73.0 in Accession #:    [id number]                  Weight:       199.0 lb Date of Birth:  [birth year]                   BSA:          2.147 m Patient Age:    28 years                    BP:           124/103 mmHg Patient Gender: M                           HR:           93 bpm. Exam Location:  Inpatient Procedure: 2D Echo, Cardiac Doppler and Color Doppler Indications:    Stroke I63.9  History:        Patient has prior history of Echocardiogram examinations, most                 recent 06/22/2017. Cardiomyopathy and CHF, CAD, Stroke; Risk                 Factors:Sleep Apnea and Dyslipidemia.  Sonographer:    Ronny Flurry Referring Phys: [id number] TIMOTHY S OPYD  Sonographer Comments: No subcostal window. IMPRESSIONS  1. Left ventricular ejection fraction, by estimation, is 40 to 45%. The left ventricle has mildly decreased function. The left ventricle demonstrates global hypokinesis. Left ventricular diastolic parameters are consistent with Grade I diastolic dysfunction (impaired relaxation).  2. Right ventricular systolic function is normal. The right ventricular size is normal.  3. The mitral valve is normal in structure. No evidence of mitral valve regurgitation.  4. Suspect bicuspid aortic valve. There is mild low flow-low gradient aortic stenosis (gradients are underestimated due to low stroke volume). The aortic valve is bicuspid. There is mild calcification of the aortic valve. There is moderate thickening of  the aortic valve. Aortic valve regurgitation is not visualized. Mild aortic valve stenosis. Aortic valve mean gradient measures 7.0 mmHg. Aortic valve Vmax measures 1.78 m/s.  5. Aortic dilatation noted. There is moderate dilatation of the aortic root, measuring 45 mm. Comparison(s): Prior images unable to be directly viewed, comparison made by report only. The aortic valve is identified as bicuspid, otherwise no  change. FINDINGS  Left Ventricle: Left ventricular ejection fraction, by estimation, is 40 to 45%. The left ventricle has mildly decreased function. The left  ventricle demonstrates global hypokinesis. The left ventricular internal cavity size was normal in size. There is  borderline concentric left ventricular hypertrophy. Left ventricular diastolic parameters are consistent with Grade I diastolic dysfunction (impaired relaxation). Normal left ventricular filling pressure. Right Ventricle: The right ventricular size is normal. No increase in right ventricular wall thickness. Right ventricular systolic function is normal. Left Atrium: Left atrial size was normal in size. Right Atrium: Right atrial size was normal in size. Pericardium: There is no evidence of pericardial effusion. Mitral Valve: The mitral valve is normal in structure. No evidence of mitral valve regurgitation. Tricuspid Valve: The tricuspid valve is normal in structure. Tricuspid valve regurgitation is not demonstrated. Aortic Valve: Suspect bicuspid aortic valve. There is mild low flow-low gradient aortic stenosis (gradients are underestimated due to low stroke volume). The aortic valve is bicuspid. There is mild calcification of the aortic valve. There is moderate thickening of the aortic valve. Aortic valve regurgitation is not visualized. Mild aortic stenosis is present. Aortic valve mean gradient measures 7.0 mmHg. Aortic valve peak gradient measures 12.7 mmHg. Aortic valve area, by VTI measures 1.97 cm. Pulmonic Valve: The pulmonic valve was grossly normal. Pulmonic valve regurgitation is not visualized. Aorta: Aortic dilatation noted. There is moderate dilatation of the aortic root, measuring 45 mm. IAS/Shunts: No atrial level shunt detected by color flow Doppler.  LEFT VENTRICLE PLAX 2D LVIDd:         4.60 cm LVIDs:         3.20 cm LV PW:         1.10 cm LV IVS:        1.20 cm LVOT diam:     2.60 cm LV SV:         60 LV SV Index:   28 LVOT  Area:     5.31 cm  RIGHT VENTRICLE RV S prime:     12.90 cm/s LEFT ATRIUM             Index        RIGHT ATRIUM           Index LA diam:        3.60 cm 1.68 cm/m   RA Area:     15.70 cm LA Vol (A2C):   39.7 ml 18.49 ml/m  RA Volume:   31.45 ml  14.65 ml/m LA Vol (A4C):   31.0 ml 14.44 ml/m LA Biplane Vol: 36.5 ml 17.00 ml/m  AORTIC VALVE AV Area (Vmax):    2.01 cm AV Area (Vmean):   1.93 cm AV Area (VTI):     1.97 cm AV Vmax:           178.50 cm/s AV Vmean:          121.500 cm/s AV VTI:            0.302 m AV Peak Grad:      12.7 mmHg AV Mean Grad:      7.0 mmHg LVOT Vmax:         67.42 cm/s LVOT Vmean:        44.100 cm/s LVOT VTI:          0.112 m LVOT/AV VTI ratio: 0.37  AORTA Ao Root diam: 4.55 cm Ao Asc diam:  4.20 cm MITRAL VALVE MV Area (PHT): 3.77 cm    SHUNTS MV Decel Time: 201 msec    Systemic VTI:  0.11 m MV E velocity: 38.00 cm/s  Systemic Diam: 2.60 cm MV A velocity:  75.10 cm/s MV E/A ratio:  0.51 Mihai Croitoru MD Electronically signed by Sanda Klein MD Signature Date/Time: 07/29/2022/4:13:22 PM    Final    CT ANGIO HEAD NECK W WO CM  Result Date: 07/29/2022 CLINICAL DATA:  Stroke, determine embolic source EXAM: CT ANGIOGRAPHY HEAD AND NECK TECHNIQUE: Multidetector CT imaging of the head and neck was performed using the standard protocol during bolus administration of intravenous contrast. Multiplanar CT image reconstructions and MIPs were obtained to evaluate the vascular anatomy. Carotid stenosis measurements (when applicable) are obtained utilizing NASCET criteria, using the distal internal carotid diameter as the denominator. RADIATION DOSE REDUCTION: This exam was performed according to the departmental dose-optimization program which includes automated exposure control, adjustment of the mA and/or kV according to patient size and/or use of iterative reconstruction technique. CONTRAST:  54mL OMNIPAQUE IOHEXOL 350 MG/ML SOLN COMPARISON:  Brain MRI from yesterday FINDINGS: CT HEAD  FINDINGS Brain: Pseudomeningocele with recent repair and posterior fossa gas along the upper cerebellum. Infarct by MRIs difficult to separate from chronic low-density in the right more than left cerebral hemisphere. Unchanged collection with mass effect asymmetrically to the left cerebral hemisphere. No hydrocephalus or acute hemorrhage. Vascular: See below Skull: Remote suboccipital craniectomy. Sinuses/Orbits: Negative Review of the MIP images confirms the above findings CTA NECK FINDINGS Aortic arch: Atheromatous plaque with 3 vessel branching. Right carotid system: Mild atheromatous plaque at the bifurcation. No ulceration or significant stenosis Left carotid system: Mild atheromatous plaque at the bifurcation. No ulceration or significant stenosis. Vertebral arteries: No proximal subclavian stenosis. Mild atheromatous plaque at the right vertebral origin. No beading or ulceration. Extrinsic narrowing of the left vertebral artery at C5-6 due to facet spurring from behind, narrowing measuring 60% on sagittal reformats. Skeleton: Ordinary cervical spine degeneration. Other neck: No acute finding. Known dissecting fluid collection spanning from the suboccipital craniectomy inferiorly into the neck. Upper chest: Clear apical lungs. Review of the MIP images confirms the above findings CTA HEAD FINDINGS Anterior circulation: Mild calcified plaque along the carotid siphons. No stenosis, branch occlusion, beading, or aneurysm Posterior circulation: The vertebral and basilar arteries are smoothly contoured and widely patent. Symmetrically enhancing superior cerebellar arteries without visible occlusion or irregularity. Symmetric and smoothly contoured posterior cerebral arteries. Venous sinuses: Unremarkable for arterial timing. Anatomic variants: None significant Review of the MIP images confirms the above findings IMPRESSION: 1. No vascular lesion seen underlying the cerebellar infarct. 2. Chronic 60% narrowing of the  left vertebral artery due to extrinsic compression by C5-6 left facet osteophyte. 3. Limited atheromatous change for age. Electronically Signed   By: Jorje Guild M.D.   On: 07/29/2022 05:06   MR Brain Wo Contrast (neuro protocol)  Result Date: 07/28/2022 CLINICAL DATA:  Neuro deficit, acute, stroke suspected. EXAM: MRI HEAD WITHOUT CONTRAST TECHNIQUE: Multiplanar, multiecho pulse sequences of the brain and surrounding structures were obtained without intravenous contrast. COMPARISON:  Head CT 07/28/2022.  MRI brain 07/05/2022. FINDINGS: Brain: New small focus of acute infarction in the right superior cerebellar hemisphere with surrounding area of increased T2 signal and cystic change, which is new since the prior MRI dated 07/05/2022 and suspicious for now chronic infarct. Postoperative changes of prior left posterior fossa resection. Redemonstrated foci of gas within the resection cavity, fourth ventricle and extra-axial spaces, likely secondary to recent pseudomeningocele repair and/or lumbar drain placement/removal. No acute intracranial hemorrhage.  No hydrocephalus. Vascular: Normal flow voids. Skull and upper cervical spine: Suboccipital craniectomy. Sinuses/Orbits: Trace fluid in the right sphenoid sinus. Other:  Decreased size of the fluid collection in the dorsal soft tissues overlying the suboccipital craniectomy. IMPRESSION: 1. New small focus of acute infarction in the right superior cerebellar hemisphere with surrounding area of increased T2 signal and cystic change, which is new since the prior MRI dated 07/05/2022 and suspicious for now chronic infarct. 2. Postoperative changes of prior left posterior fossa resection. Redemonstrated foci of gas within the resection cavity, fourth ventricle and extra-axial spaces, likely secondary to recent pseudomeningocele repair and/or lumbar drain placement/removal. 3. Decreased size of the fluid collection in the dorsal soft tissues overlying the  suboccipital craniectomy. Electronically Signed   By: Emmit Alexanders M.D.   On: 07/28/2022 20:06   CT Head Wo Contrast  Result Date: 07/28/2022 CLINICAL DATA:  Headache, neuro deficit. EXAM: CT HEAD WITHOUT CONTRAST TECHNIQUE: Contiguous axial images were obtained from the base of the skull through the vertex without intravenous contrast. RADIATION DOSE REDUCTION: This exam was performed according to the departmental dose-optimization program which includes automated exposure control, adjustment of the mA and/or kV according to patient size and/or use of iterative reconstruction technique. COMPARISON:  Head CT 04/29/2022.  MRI brain 07/05/2022. 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 New pneumocephalus in the resection cavity, likely secondary to recent pseudomeningocele repair and lumbar drain placement/removal. No acute intracranial hemorrhage, mass effect or midline shift. No hydrocephalus. Vascular: No hyperdense vessel or unexpected calcification. Skull: Suboccipital craniectomy. Sinuses/Orbits: Trace fluid in the sphenoid sinus. Orbits are unremarkable. Other: Partially imaged fluid collection in the dorsal soft tissues overlying the suboccipital craniectomy, likely residual or recurrent pseudomeningocele. IMPRESSION: 1. 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 New pneumocephalus in the resection cavity, likely secondary to recent pseudomeningocele repair and lumbar drain placement/removal. No acute intracranial hemorrhage. 2. Partially imaged fluid collection in the dorsal soft tissues overlying the suboccipital craniectomy, likely residual or recurrent pseudomeningocele. Electronically Signed   By: Emmit Alexanders M.D.   On: 07/28/2022 17:15    Assessment/Plan: Diagnosis: Pseudomeningocele repair  with worsening ataxia, dysarthria, dysphagia Does the need for close, 24 hr/day medical supervision in concert with the patient's rehab needs make it unreasonable for this patient to be served in a less intensive setting? Yes Co-Morbidities requiring supervision/potential complications:  -Prediabetes -OSA -Systolic/diastolic heart failure -Rheumatoid arthritis -Depression -Hyperlipidemia Due to bladder management, bowel management, safety, skin/wound care, disease management, medication administration, pain management, and patient education, does the patient require 24 hr/day rehab nursing? Yes Does the patient require coordinated care of a physician, rehab nurse, therapy disciplines of PT/OT/SLP  to address physical and functional deficits in the context of the above medical diagnosis(es)? Yes Addressing deficits in the following areas: balance, endurance, locomotion, strength, transferring, bowel/bladder control, bathing, dressing, feeding, grooming, toileting, swallowing, and psychosocial support Can the patient actively participate in an intensive therapy program of at least 3 hrs of therapy per day at least 5 days per week? Yes The potential for patient to make measurable gains while on inpatient rehab is excellent Anticipated functional outcomes upon discharge from inpatient rehab are modified independent  with PT, modified independent with OT, modified independent with SLP. Estimated rehab length of stay to reach the above functional goals is: 7-10 Anticipated discharge destination: Home Overall Rehab/Functional Prognosis: excellent  POST ACUTE RECOMMENDATIONS: This patient's condition is appropriate for continued rehabilitative care in the following setting: [facility] Patient has agreed to participate in recommended program. Yes  Note that insurance prior authorization may be required for reimbursement for recommended care.  Comment: Patient with functional deficits related to ataxia,  dysarthria and dysphagia.  He would be a good candidate for treatment with inpatient therapy at [facility].  I have personally performed a face to face diagnostic evaluation of this patient. Additionally, I have examined the patient's medical record including any pertinent labs and radiographic images. If the physician assistant has documented in this note, I have reviewed and edited or otherwise concur with the physician assistant's documentation.  Thanks,  Jennye Boroughs, MD 07/30/2022

## 2022-07-30 NOTE — Progress Notes (Signed)
Modified Barium Swallow Study  Patient Details  Name: Christopher Yu MRN: [id number] Date of Birth: 10-05-1960  Today's Date: 07/30/2022  Modified Barium Swallow completed.  Full report located under Chart Review in the Imaging Section.  History of Present Illness 62 y.o. male presents to [facility] hospital on 07/28/2022 with slurred speech, worsening gait and HA. Pt recently discharged 07/25/2022 after operative repair of pseudomeningiocele. MRI brain 3/25 demonstrates acute R cerebellar infarct. PMH: for CAD, R TKA, cardiomyopathy, chronic systolic heart failure, macular degeneration.   Clinical Impression Pt demonstrated minimal oral dysphagia with trace lingual residue present intermittently and mild pharyngeal dysphagia. Pt exhibited decreased laryngeal elevation with thin barium entering airway prior to full laryngeal closure resulting in penetration remaining on the anterior portion of vestibule. A chin tuck posture was not effective and he continued to penetrate with spontaeous cough that expelled penetrates. A head turn to left with thin resulted in sensed aspiration with weak cough. Nectar thick was also penetrated during and after the swallow and chin tuck was ineffective however a head turn to the left was 90% effective to prevent penetration over multiple trials. Reduced tonuge base retraction led to minimal vallecular residue consistently. Pt was able to masticate solid texture with flash penetration. Given deconditioning recommend he start a more conservative diet texture of Dys 2 (minced), nectar thick with head turn to the left with liquids, intermittent cough/throat clear, no straws, small sips and initiate meds crushed in puree (can advance to whole in puree as able). Factors that may increase risk of adverse event in presence of aspiration Christopher Yu & Christopher Yu 2021):    Swallow Evaluation Recommendations Recommendations: PO diet PO Diet Recommendation: Dysphagia 2 (Finely  chopped);Mildly thick liquids (Level 2, nectar thick) Liquid Administration via: Cup;No straw Medication Administration: Crushed with puree Supervision: Staff to assist with self-feeding;Full supervision/cueing for swallowing strategies Swallowing strategies  : Head turn left during swallowing (with liquids) Postural changes: Position pt fully upright for meals Oral care recommendations: Oral care BID (2x/day)      Christopher Yu 07/30/2022,12:27 PM

## 2022-07-30 NOTE — Progress Notes (Signed)
Initial Nutrition Assessment  DOCUMENTATION CODES:   Severe malnutrition in context of chronic illness  INTERVENTION:  Continue diet as recommended by SLP Monitor PO intake Magic cup TID with meals, each supplement provides 290 kcal and 9 grams of protein Nectar Thick Mighty Shake TID, each supplement provides 330 and 9g protein   NUTRITION DIAGNOSIS:   Severe Malnutrition (in the context of chronic illness) related to poor appetite as evidenced by severe muscle depletion, percent weight loss.  GOAL:   Patient will meet greater than or equal to 90% of their needs  MONITOR:   PO intake, Supplement acceptance, Labs, Weight trends  REASON FOR ASSESSMENT:   Malnutrition Screening Tool    ASSESSMENT:   Pt with hx of CAD, CHF, HLD, and recent pseudomeningocele s/p drainage presented to ED with difficulty speaking and gait issues.  Imaging in ED initially read as a CVA but after comparing recent MRI, Neurology and Neurosurgery feel it is more consistent with post-op changes.  Pt resting in bed at the time of assessment, wife at bedside. Discussed events over the last few months. Pt and wife endorse that since pt's initial hospitalization in December for AVM resection he has had periods of poor PO and decline. Noted a 15.2% weight loss noted over the last 3 months.which is extreme and severe for this time frame. Pt endorses that he has had poor energy and is getting fatigued performing his normal daily activities.   Pt did undergo an MBS this AM and had diet put in place. However, eggs were not well tolerated and pt began to cough. Frustrated with inability to consume foods that he wants and all he had had since his diet was advanced was a carton of nectar thick apple juice. Discussed with SLP and MD.   Did speak to pt and wife about my concerns with his excessive weight loss and the muscle and fat deficis that are seen on exam. Pt meets criteria for severe malnutrition. Discussed the  importance of starting to obtain adequate nutrition in rebuilding lost muscle stores and preserving functional status. SLP made changes to diet and discussed strategies with pt. Hopeful that pt will be able to increase his PO intake but did mention a cortrak tube as a last resort as a tool in providing nutrition while swallowing improves.   Pt being evaluated for CIR.  Nutritionally Relevant Medications: Scheduled Meds:  dexamethasone injection  4 mg Intravenous Q8H   rosuvastatin  20 mg Oral Daily   PRN Meds: senna-docusate  Labs Reviewed  NUTRITION - FOCUSED PHYSICAL EXAM: Flowsheet Row Most Recent Value  Orbital Region Mild depletion  Upper Arm Region Moderate depletion  Thoracic and Lumbar Region Mild depletion  Buccal Region Mild depletion  Temple Region Mild depletion  Clavicle Bone Region Mild depletion  Clavicle and Acromion Bone Region Mild depletion  Scapular Bone Region Mild depletion  Dorsal Hand Moderate depletion  Patellar Region Severe depletion  Anterior Thigh Region Severe depletion  Posterior Calf Region Severe depletion  Edema (RD Assessment) None  Hair Reviewed  Eyes Reviewed  Mouth Reviewed  Skin Reviewed  Nails Reviewed   Diet Order:   Diet Order             Diet regular Room service appropriate? No; Fluid consistency: Nectar Thick  Diet effective now                   EDUCATION NEEDS:   Education needs have been addressed  Skin:  Skin Assessment: Reviewed RN Assessment  Last BM:  unsure  Height:   Ht Readings from Last 1 Encounters:  07/28/22 6\' 1"  (1.854 m)    Weight:   Wt Readings from Last 1 Encounters:  07/28/22 90.3 kg    Ideal Body Weight:  83.6 kg  BMI:  Body mass index is 26.25 kg/m.  Estimated Nutritional Needs:  Kcal:  2100-2300 kcal/d Protein:  105-120g/d Fluid:  2.2 L/d    Ranell Patrick, RD, LDN Clinical Dietitian RD pager # available in [place]  After hours/weekend pager # available in [facility]

## 2022-07-30 NOTE — Evaluation (Signed)
Clinical/Bedside Swallow Evaluation Patient Details  Name: Christopher Yu MRN: [id number] Date of Birth: [birth year]  Today's Date: 07/30/2022 Time: SLP Start Time (ACUTE ONLY): [id number] SLP Stop Time (ACUTE ONLY): 0908 SLP Time Calculation (min) (ACUTE ONLY): 14 min  Past Medical History:  Past Medical History:  Diagnosis Date   Anxiety    Arthritis, rheumatoid ([place])    dx 1988   CAD (coronary artery disease), native coronary artery    Mild LAD disease with calcification noted at cath 12//27/16    Cardiomyopathy ([place])    Cardiomyopathy ([place])    Chronic systolic heart failure ([place]) 05/01/2015   Depression    Deviated nasal septum 06/25/2011   Headache    High cholesterol    Hyperlipidemia    Impingement syndrome of left shoulder 12/22/2017   Macular degeneration    Rheumatoid aortitis    Sleep apnea    does not use cpap - UNABLE TO TOLERATE MASK   Sleep apnea in adult    deviated septum repaired, most recent sleep study was negative   Status post total right knee replacement 06/01/2015   Past Surgical History:  Past Surgical History:  Procedure Laterality Date   ANKLE FUSION Right 05/05/2012   related to his arthritis   ANKLE FUSION Left 05/25/2013   related to his arthritis   ANKLE SURGERY Bilateral    APPLICATION OF CRANIAL NAVIGATION N/A 04/11/2022   Procedure: APPLICATION OF CRANIAL NAVIGATION;  Surgeon: Consuella Lose, MD;  Location: [place];  Service: Neurosurgery;  Laterality: N/A;   CARDIAC CATHETERIZATION N/A 05/01/2015   Procedure: Left Heart Cath and Coronary Angiography;  Surgeon: Belva Crome, MD;  Location: [place] [facility];  Service: Cardiovascular;  Laterality: N/A;   CRANIOTOMY N/A 04/11/2022   Procedure: STEREOTACTIC SUBOCCIPITAL CRANIOTOMY FOR RESECTION OF ARTERIO-VENOUS MALFORMATION;  Surgeon: Consuella Lose, MD;  Location: [place];  Service: Neurosurgery;  Laterality: N/A;   FRACTURE SURGERY     left femur fracture x 3   IR ANGIO  EXTERNAL CAROTID SEL EXT CAROTID BILAT MOD SED  01/28/2022   IR ANGIO INTRA EXTRACRAN SEL INTERNAL CAROTID BILAT MOD SED  01/28/2022   IR ANGIO INTRA EXTRACRAN SEL INTERNAL CAROTID BILAT MOD SED  04/16/2022   IR ANGIO VERTEBRAL SEL VERTEBRAL BILAT MOD SED  01/28/2022   IR ANGIO VERTEBRAL SEL VERTEBRAL BILAT MOD SED  04/16/2022   KNEE ARTHROSCOPY     right x 4   LUMBAR LAMINECTOMY/DECOMPRESSION MICRODISCECTOMY N/A 07/18/2022   Procedure: Repair of Pseudomeningiocele posterior;  Surgeon: Consuella Lose, MD;  Location: [place];  Service: Neurosurgery;  Laterality: N/A;   NASAL SEPTOPLASTY W/ TURBINOPLASTY  06/25/2011   Procedure: NASAL SEPTOPLASTY WITH TURBINATE REDUCTION;  Surgeon: Jerrell Belfast, MD;  Location: [place];  Service: ENT;  Laterality: Bilateral;   PLACEMENT OF LUMBAR DRAIN N/A 07/18/2022   Procedure: PLACEMENT OF LUMBAR DRAIN;  Surgeon: Consuella Lose, MD;  Location: [place];  Service: Neurosurgery;  Laterality: N/A;   TONSILLECTOMY     TOTAL KNEE ARTHROPLASTY Right 06/01/2015   Procedure: RIGHT TOTAL KNEE ARTHROPLASTY;  Surgeon: Mcarthur Rossetti, MD;  [facility];  Service: Orthopedics;  Laterality: Right;  Block+general   HPI:  62 y.o. male presents to [facility] hospital on 07/28/2022 with slurred speech, worsening gait and HA. Pt recently discharged 07/25/2022 after operative repair of pseudomeningiocele. MRI brain 3/25 demonstrates acute R cerebellar infarct. PMH: for CAD, R TKA, cardiomyopathy, chronic systolic heart failure, macular degeneration.    Assessment / Plan /  Recommendation  Clinical Impression  Pt's wife states he had been having trouble with foods and liquids at once once discharged after recent procedure. Intermittently he has been coughing when eating. He exhibits mild facial asymmetry on left side with dysarthric speech and low intensity. He consumed applesauce, thin and solid texture with what appeared to be timely onset from observation. There was one  delayed cough at end of eval. Given reports of dysphagia from pt/family and RN, pt and instrumental assessment is recommended at [facility] is scheduled for today at 10:30. Prior to study he can have sips thn and applesauce if desired. SLP Visit Diagnosis: Dysphagia, unspecified (R13.10)    Aspiration Risk  Mild aspiration risk    Diet Recommendation Other (Comment) (applesauce, sips thin)   Liquid Administration via: Cup Compensations: Slow rate;Small sips/bites Postural Changes: Seated upright at 90 degrees    Other  Recommendations Oral Care Recommendations: Oral care BID    Recommendations for follow up therapy are one component of a multi-disciplinary discharge planning process, led by the attending physician.  Recommendations may be updated based on patient status, additional functional criteria and insurance authorization.  Follow up Recommendations        Assistance Recommended at Discharge    Functional Status Assessment    Frequency and Duration            Prognosis        Swallow Study   General HPI: 62 y.o. male presents to [facility] hospital on 07/28/2022 with slurred speech, worsening gait and HA. Pt recently discharged 07/25/2022 after operative repair of pseudomeningiocele. MRI brain 3/25 demonstrates acute R cerebellar infarct. PMH: for CAD, R TKA, cardiomyopathy, chronic systolic heart failure, macular degeneration. Type of Study: Bedside Swallow Evaluation Previous Swallow Assessment:  (none) Diet Prior to this Study: NPO Temperature Spikes Noted: No Respiratory Status: Nasal cannula History of Recent Intubation: No Behavior/Cognition: Alert;Cooperative;Pleasant mood Oral Cavity Assessment: Within Functional Limits Oral Care Completed by SLP: No Oral Cavity - Dentition: Adequate natural dentition Vision: Functional for self-feeding Self-Feeding Abilities: Needs assist Patient Positioning: Upright in bed Baseline Vocal Quality: Low vocal intensity Volitional Cough:  Weak    Oral/Motor/Sensory Function Overall Oral Motor/Sensory Function: Mild impairment Facial ROM: Reduced left;Suspected CN VII (facial) dysfunction Facial Symmetry: Suspected CN VII (facial) dysfunction;Abnormal symmetry left Facial Strength: Reduced left;Suspected CN VII (facial) dysfunction   Ice Chips Ice chips: Not tested   Thin Liquid Thin Liquid: Impaired Presentation: Cup;Straw Pharyngeal  Phase Impairments: Cough - Delayed    Nectar Thick Nectar Thick Liquid: Not tested   Honey Thick Honey Thick Liquid: Not tested   Puree Puree: Within functional limits   Solid     Solid: Within functional limits      Houston Siren 07/30/2022,9:21 AM

## 2022-07-30 NOTE — Progress Notes (Signed)
Physical Therapy Treatment Patient Details Name: Christopher Yu MRN: [id number] DOB: 09-14-1960 Today's Date: 07/30/2022   History of Present Illness 62 y.o. male presents to [facility] hospital on 07/28/2022 with slurred speech, worsening gait and HA. Pt recently discharged 07/25/2022 after operative repair of pseudomeningiocele. MRI brain 3/25 demonstrates acute R cerebellar infarct. PMH: for CAD, R TKA, cardiomyopathy, chronic systolic heart failure, macular degeneration.    PT Comments    Pt making great progress towards his physical therapy goals, exhibiting improved balance, ambulation distance and activity tolerance this session. Session focused on sitting and standing balance activities and gait training. Pt requiring min-mod assist for functional mobility. Exhibits mildly ataxic gait with decreased L sided coordination and increased L lateral lean with fatigue. Has excellent potential for neuro recovery based on age, PLOF, motivation and family support. Patient will benefit from intensive inpatient follow up therapy, >3 hours/day   Recommendations for follow up therapy are one component of a multi-disciplinary discharge planning process, led by the attending physician.  Recommendations may be updated based on patient status, additional functional criteria and insurance authorization.        Assistance Recommended at Discharge Frequent or constant Supervision/Assistance  Patient can return home with the following A lot of help with walking and/or transfers;A lot of help with bathing/dressing/bathroom;Assistance with cooking/housework;Direct supervision/assist for medications management;Direct supervision/assist for financial management;Assist for transportation;Help with stairs or ramp for entrance   Equipment Recommendations  None recommended by PT    Recommendations for Other Services Rehab consult     Precautions / Restrictions Precautions Precautions:  Fall Restrictions Weight Bearing Restrictions: No     Mobility  Bed Mobility Overal bed mobility: Needs Assistance Bed Mobility: Supine to Sit, Sit to Supine     Supine to sit: Supervision Sit to supine: Supervision   General bed mobility comments: No physical assist required    Transfers Overall transfer level: Needs assistance Equipment used: Rolling walker (2 wheels) Transfers: Sit to/from Stand Sit to Stand: Min assist           General transfer comment: Increased time to rise, light minA to power up to walker    Ambulation/Gait Ambulation/Gait assistance: Min assist, Mod assist Gait Distance (Feet): 100 Feet Assistive device: Rolling walker (2 wheels) Gait Pattern/deviations: Decreased step length - left, Ataxic, Trunk flexed, Step-to pattern Gait velocity: reduced     General Gait Details: Verbal cues for increased L step length, upright posture, environmental/obstacle negotiation. Pt requiring min assist overall for balance, regressing to modA with fatigue and increased L lateral lean in standing   Stairs             Wheelchair Mobility    Modified Rankin (Stroke Patients Only) Modified Rankin (Stroke Patients Only) Pre-Morbid Rankin Score: No symptoms Modified Rankin: Moderately severe disability     Balance Overall balance assessment: Needs assistance Sitting-balance support: Feet supported Sitting balance-Leahy Scale: Fair     Standing balance support: No upper extremity supported, During functional activity Standing balance-Leahy Scale: Poor Standing balance comment: Requiring min guard when brushing teeth at sink                            Cognition Arousal/Alertness: Awake/alert Behavior During Therapy: Flat affect Overall Cognitive Status: Impaired/Different from baseline Area of Impairment: Problem solving, Awareness  Awareness: Emergent Problem Solving: Requires verbal cues           Exercises Other Exercises Other Exercises: Seated: functional reaching with BUE's    General Comments        Pertinent Vitals/Pain Pain Assessment Pain Assessment: No/denies pain    Home Living                          Prior Function            PT Goals (current goals can now be found in the care plan section) Acute Rehab PT Goals Patient Stated Goal: to return to independence in mobility PT Goal Formulation: With patient/family Time For Goal Achievement: 08/12/22 Potential to Achieve Goals: Good Progress towards PT goals: Progressing toward goals    Frequency    Min 4X/week      PT Plan Current plan remains appropriate    Co-evaluation              AM-PAC PT "6 Clicks" Mobility   Outcome Measure  Help needed turning from your back to your side while in a flat bed without using bedrails?: A Little Help needed moving from lying on your back to sitting on the side of a flat bed without using bedrails?: A Little Help needed moving to and from a bed to a chair (including a wheelchair)?: A Little Help needed standing up from a chair using your arms (e.g., wheelchair or bedside chair)?: A Little Help needed to walk in hospital room?: A Lot Help needed climbing 3-5 steps with a railing? : A Lot 6 Click Score: 16    End of Session Equipment Utilized During Treatment: Gait belt Activity Tolerance: Patient tolerated treatment well Patient left: in bed;with call bell/phone within reach;with family/visitor present;with bed alarm set Nurse Communication: Mobility status PT Visit Diagnosis: Other abnormalities of gait and mobility (R26.89);Unsteadiness on feet (R26.81);Other symptoms and signs involving the nervous system (R29.898)     Time: [id number] PT Time Calculation (min) (ACUTE ONLY): 24 min  Charges:  $Gait Training: 8-22 mins $Therapeutic Activity: 8-22 mins                     Wyona Almas, PT, DPT Acute Rehabilitation  Services Office [place] 07/30/2022, 10:38 AM

## 2022-07-31 ENCOUNTER — Inpatient Hospital Stay (HOSPITAL_COMMUNITY)
Admission: RE | Admit: 2022-07-31 | Discharge: 2022-08-26 | DRG: 031 | Disposition: A | Discharge status: Other Acute Inpatient Hospital | Payer: BC Managed Care – PPO | Source: Intra-hospital | Attending: Physical Medicine and Rehabilitation | Admitting: Physical Medicine and Rehabilitation

## 2022-07-31 ENCOUNTER — Encounter (HOSPITAL_COMMUNITY): Payer: Self-pay | Admitting: Physical Medicine and Rehabilitation

## 2022-07-31 ENCOUNTER — Other Ambulatory Visit: Payer: Self-pay

## 2022-07-31 DIAGNOSIS — I639 Cerebral infarction, unspecified: Secondary | ICD-10-CM | POA: Diagnosis present

## 2022-07-31 DIAGNOSIS — Z982 Presence of cerebrospinal fluid drainage device: Secondary | ICD-10-CM

## 2022-07-31 DIAGNOSIS — G910 Communicating hydrocephalus: Secondary | ICD-10-CM | POA: Diagnosis not present

## 2022-07-31 DIAGNOSIS — Z87891 Personal history of nicotine dependence: Secondary | ICD-10-CM

## 2022-07-31 DIAGNOSIS — G4733 Obstructive sleep apnea (adult) (pediatric): Secondary | ICD-10-CM | POA: Diagnosis not present

## 2022-07-31 DIAGNOSIS — E7800 Pure hypercholesterolemia, unspecified: Secondary | ICD-10-CM | POA: Diagnosis not present

## 2022-07-31 DIAGNOSIS — E785 Hyperlipidemia, unspecified: Secondary | ICD-10-CM

## 2022-07-31 DIAGNOSIS — E43 Unspecified severe protein-calorie malnutrition: Secondary | ICD-10-CM | POA: Insufficient documentation

## 2022-07-31 DIAGNOSIS — R1310 Dysphagia, unspecified: Secondary | ICD-10-CM | POA: Diagnosis not present

## 2022-07-31 DIAGNOSIS — G4700 Insomnia, unspecified: Secondary | ICD-10-CM | POA: Diagnosis present

## 2022-07-31 DIAGNOSIS — I959 Hypotension, unspecified: Secondary | ICD-10-CM | POA: Diagnosis not present

## 2022-07-31 DIAGNOSIS — I10 Essential (primary) hypertension: Secondary | ICD-10-CM | POA: Diagnosis not present

## 2022-07-31 DIAGNOSIS — G96198 Other disorders of meninges, not elsewhere classified: Secondary | ICD-10-CM | POA: Diagnosis not present

## 2022-07-31 DIAGNOSIS — Z5189 Encounter for other specified aftercare: Secondary | ICD-10-CM

## 2022-07-31 DIAGNOSIS — T380X5A Adverse effect of glucocorticoids and synthetic analogues, initial encounter: Secondary | ICD-10-CM | POA: Diagnosis not present

## 2022-07-31 DIAGNOSIS — D72829 Elevated white blood cell count, unspecified: Secondary | ICD-10-CM | POA: Diagnosis present

## 2022-07-31 DIAGNOSIS — H3530 Unspecified macular degeneration: Secondary | ICD-10-CM | POA: Diagnosis present

## 2022-07-31 DIAGNOSIS — I69993 Ataxia following unspecified cerebrovascular disease: Secondary | ICD-10-CM | POA: Diagnosis not present

## 2022-07-31 DIAGNOSIS — R1312 Dysphagia, oropharyngeal phase: Secondary | ICD-10-CM

## 2022-07-31 DIAGNOSIS — Z79899 Other long term (current) drug therapy: Secondary | ICD-10-CM | POA: Diagnosis not present

## 2022-07-31 DIAGNOSIS — R7303 Prediabetes: Secondary | ICD-10-CM | POA: Diagnosis not present

## 2022-07-31 DIAGNOSIS — Z96651 Presence of right artificial knee joint: Secondary | ICD-10-CM | POA: Diagnosis not present

## 2022-07-31 DIAGNOSIS — G4489 Other headache syndrome: Secondary | ICD-10-CM | POA: Diagnosis not present

## 2022-07-31 DIAGNOSIS — R519 Headache, unspecified: Secondary | ICD-10-CM | POA: Diagnosis not present

## 2022-07-31 DIAGNOSIS — G4730 Sleep apnea, unspecified: Secondary | ICD-10-CM | POA: Diagnosis present

## 2022-07-31 DIAGNOSIS — M069 Rheumatoid arthritis, unspecified: Secondary | ICD-10-CM | POA: Diagnosis present

## 2022-07-31 DIAGNOSIS — Z7982 Long term (current) use of aspirin: Secondary | ICD-10-CM

## 2022-07-31 DIAGNOSIS — F4320 Adjustment disorder, unspecified: Secondary | ICD-10-CM | POA: Diagnosis not present

## 2022-07-31 DIAGNOSIS — I69320 Aphasia following cerebral infarction: Secondary | ICD-10-CM

## 2022-07-31 DIAGNOSIS — R471 Dysarthria and anarthria: Secondary | ICD-10-CM | POA: Diagnosis not present

## 2022-07-31 DIAGNOSIS — Z832 Family history of diseases of the blood and blood-forming organs and certain disorders involving the immune mechanism: Secondary | ICD-10-CM

## 2022-07-31 DIAGNOSIS — I429 Cardiomyopathy, unspecified: Secondary | ICD-10-CM | POA: Diagnosis not present

## 2022-07-31 DIAGNOSIS — G9389 Other specified disorders of brain: Secondary | ICD-10-CM | POA: Diagnosis not present

## 2022-07-31 DIAGNOSIS — R4701 Aphasia: Secondary | ICD-10-CM | POA: Diagnosis present

## 2022-07-31 DIAGNOSIS — M0520 Rheumatoid vasculitis with rheumatoid arthritis of unspecified site: Secondary | ICD-10-CM

## 2022-07-31 DIAGNOSIS — I69322 Dysarthria following cerebral infarction: Secondary | ICD-10-CM

## 2022-07-31 DIAGNOSIS — I25810 Atherosclerosis of coronary artery bypass graft(s) without angina pectoris: Secondary | ICD-10-CM

## 2022-07-31 DIAGNOSIS — I2510 Atherosclerotic heart disease of native coronary artery without angina pectoris: Secondary | ICD-10-CM | POA: Diagnosis present

## 2022-07-31 DIAGNOSIS — I63549 Cerebral infarction due to unspecified occlusion or stenosis of unspecified cerebellar artery: Secondary | ICD-10-CM | POA: Diagnosis not present

## 2022-07-31 DIAGNOSIS — Z79631 Long term (current) use of antimetabolite agent: Secondary | ICD-10-CM

## 2022-07-31 DIAGNOSIS — M62838 Other muscle spasm: Secondary | ICD-10-CM | POA: Diagnosis present

## 2022-07-31 DIAGNOSIS — R4182 Altered mental status, unspecified: Secondary | ICD-10-CM | POA: Diagnosis not present

## 2022-07-31 DIAGNOSIS — I5022 Chronic systolic (congestive) heart failure: Secondary | ICD-10-CM | POA: Diagnosis not present

## 2022-07-31 DIAGNOSIS — M0609 Rheumatoid arthritis without rheumatoid factor, multiple sites: Secondary | ICD-10-CM | POA: Diagnosis not present

## 2022-07-31 DIAGNOSIS — S60211D Contusion of right wrist, subsequent encounter: Secondary | ICD-10-CM

## 2022-07-31 DIAGNOSIS — I69391 Dysphagia following cerebral infarction: Secondary | ICD-10-CM

## 2022-07-31 LAB — BASIC METABOLIC PANEL
Anion gap: 10 (ref 5–15)
BUN: 16 mg/dL (ref 6–20)
CO2: 28 mmol/L (ref 22–32)
Calcium: 9.1 mg/dL (ref 8.9–10.3)
Chloride: 102 mmol/L (ref 98–111)
Creatinine, Ser: 0.82 mg/dL (ref 0.61–1.24)
GFR, Estimated: >60 mL/min (ref >60)
Glucose, Bld: 125 mg/dL — ABNORMAL HIGH (ref 70–99)
Potassium: 3.5 mmol/L (ref 3.5–5.1)
Sodium: 140 mmol/L (ref 135–145)

## 2022-07-31 LAB — CBC
HCT: 31.7 % — ABNORMAL LOW (ref 39.0–52.0)
Hemoglobin: 10.4 g/dL — ABNORMAL LOW (ref 13.0–17.0)
MCH: 23.8 pg — ABNORMAL LOW (ref 26.0–34.0)
MCHC: 32.8 g/dL (ref 30.0–36.0)
MCV: 72.5 fL — ABNORMAL LOW (ref 80.0–100.0)
Platelets: 241 10*3/uL (ref 150–400)
RBC: 4.37 MIL/uL (ref 4.22–5.81)
RDW: 18.5 % — ABNORMAL HIGH (ref 11.5–15.5)
WBC: 10.2 10*3/uL (ref 4.0–10.5)
nRBC: 0 % (ref 0.0–0.2)

## 2022-07-31 LAB — MAGNESIUM: Magnesium: 2 mg/dL (ref 1.7–2.4)

## 2022-07-31 MED ORDER — DEXAMETHASONE SODIUM PHOSPHATE 4 MG/ML IJ SOLN
1.0000 mg | Freq: Three times a day (TID) | INTRAMUSCULAR | Status: AC
  Administered 2022-08-04 – 2022-08-07 (×9): 1 mg via INTRAVENOUS
  Filled 2022-07-31 (×10): qty 1

## 2022-07-31 MED ORDER — ENOXAPARIN SODIUM 40 MG/0.4ML IJ SOSY
40.0000 mg | PREFILLED_SYRINGE | INTRAMUSCULAR | Status: DC
  Administered 2022-07-31 – 2022-08-07 (×8): 40 mg via SUBCUTANEOUS
  Filled 2022-07-31 (×8): qty 0.4

## 2022-07-31 MED ORDER — KETOROLAC TROMETHAMINE 15 MG/ML IJ SOLN
15.0000 mg | Freq: Once | INTRAMUSCULAR | Status: AC
  Administered 2022-07-31: 15 mg via INTRAVENOUS
  Filled 2022-07-31: qty 1

## 2022-07-31 MED ORDER — ACETAMINOPHEN 325 MG PO TABS
650.0000 mg | ORAL_TABLET | ORAL | Status: DC | PRN
  Administered 2022-08-01: 650 mg via ORAL
  Filled 2022-07-31: qty 2

## 2022-07-31 MED ORDER — DEXAMETHASONE SODIUM PHOSPHATE 4 MG/ML IJ SOLN
2.0000 mg | Freq: Three times a day (TID) | INTRAMUSCULAR | Status: AC
  Administered 2022-08-01 – 2022-08-04 (×9): 2 mg via INTRAVENOUS
  Filled 2022-07-31 (×9): qty 1

## 2022-07-31 MED ORDER — ACETAMINOPHEN 650 MG RE SUPP: 650.0000 mg | RECTAL | Status: DC | PRN

## 2022-07-31 MED ORDER — ENOXAPARIN SODIUM 40 MG/0.4ML IJ SOSY: 40.0000 mg | PREFILLED_SYRINGE | INTRAMUSCULAR | Status: DC

## 2022-07-31 MED ORDER — SERTRALINE HCL 50 MG PO TABS
50.0000 mg | ORAL_TABLET | Freq: Every morning | ORAL | Status: DC
  Administered 2022-08-01 – 2022-08-12 (×11): 50 mg via ORAL
  Filled 2022-07-31 (×11): qty 1

## 2022-07-31 MED ORDER — ROSUVASTATIN CALCIUM 20 MG PO TABS
20.0000 mg | ORAL_TABLET | Freq: Every day | ORAL | Status: DC
  Administered 2022-08-01 – 2022-08-26 (×25): 20 mg via ORAL
  Filled 2022-07-31 (×25): qty 1

## 2022-07-31 MED ORDER — DEXAMETHASONE SODIUM PHOSPHATE 4 MG/ML IJ SOLN: 1.0000 mg | Freq: Every day | INTRAMUSCULAR | Status: DC

## 2022-07-31 MED ORDER — DEXAMETHASONE 4 MG PO TABS
4.0000 mg | ORAL_TABLET | Freq: Three times a day (TID) | ORAL | Status: DC
  Administered 2022-07-31: 4 mg via ORAL
  Filled 2022-07-31: qty 1

## 2022-07-31 MED ORDER — SENNOSIDES-DOCUSATE SODIUM 8.6-50 MG PO TABS: 1.0000 | ORAL_TABLET | Freq: Every evening | ORAL | Status: DC | PRN

## 2022-07-31 MED ORDER — DIAZEPAM 2 MG PO TABS
5.0000 mg | ORAL_TABLET | Freq: Three times a day (TID) | ORAL | Status: DC | PRN
  Administered 2022-08-09 – 2022-08-23 (×3): 5 mg via ORAL
  Filled 2022-07-31 (×3): qty 3

## 2022-07-31 MED ORDER — DEXAMETHASONE SODIUM PHOSPHATE 4 MG/ML IJ SOLN
1.0000 mg | Freq: Two times a day (BID) | INTRAMUSCULAR | Status: DC
  Filled 2022-07-31 (×2): qty 1

## 2022-07-31 MED ORDER — DEXAMETHASONE SODIUM PHOSPHATE 4 MG/ML IJ SOLN
4.0000 mg | Freq: Three times a day (TID) | INTRAMUSCULAR | Status: AC
  Administered 2022-07-31 – 2022-08-01 (×2): 4 mg via INTRAVENOUS
  Filled 2022-07-31 (×2): qty 1

## 2022-07-31 MED ORDER — ACETAMINOPHEN 160 MG/5ML PO SOLN: 650.0000 mg | ORAL | Status: DC | PRN

## 2022-07-31 MED ORDER — ASPIRIN 81 MG PO TBEC
81.0000 mg | DELAYED_RELEASE_TABLET | Freq: Every day | ORAL | Status: DC
  Administered 2022-08-01 – 2022-08-07 (×7): 81 mg via ORAL
  Filled 2022-07-31 (×7): qty 1

## 2022-07-31 NOTE — Evaluation (Signed)
Speech Language Pathology Evaluation Patient Details Name: Christopher Yu MRN: [id number] DOB: 01-07-1961 Today's Date: 07/31/2022 Time: [id number] SLP Time Calculation (min) (ACUTE ONLY): 11 min  Problem List:  Patient Active Problem List   Diagnosis Date Noted   Protein-calorie malnutrition, severe 07/31/2022   Acute CVA (cerebrovascular accident) ([place]) 07/29/2022   Ischemic cerebrovascular accident (CVA) ([place]) 07/28/2022   Prolonged QT interval 07/28/2022   Pseudomeningocele 07/18/2022   Pseudomeningocele due to surgical procedure 07/18/2022   Status post craniotomy 04/11/2022   AVM (arteriovenous malformation) brain 04/11/2022   Impingement syndrome of left shoulder 12/22/2017   Impingement syndrome of right shoulder 12/22/2017   Rheumatoid arthritis involving right knee ([place]) 06/01/2015   Status post total right knee replacement 06/01/2015   Cardiomyopathy ([place]) 05/02/2015   Hyperlipidemia    Sleep apnea in adult    Rheumatoid arthritis ([place])    Chronic systolic heart failure ([place]) 05/01/2015   CAD (coronary artery disease), native coronary artery    Past Medical History:  Past Medical History:  Diagnosis Date   Anxiety    Arthritis, rheumatoid ([place])    dx 1988   CAD (coronary artery disease), native coronary artery    Mild LAD disease with calcification noted at cath 12//27/16    Cardiomyopathy ([place])    Cardiomyopathy ([place])    Chronic systolic heart failure ([place]) 05/01/2015   Depression    Deviated nasal septum 06/25/2011   Headache    High cholesterol    Hyperlipidemia    Impingement syndrome of left shoulder 12/22/2017   Macular degeneration    Rheumatoid aortitis    Sleep apnea    does not use cpap - UNABLE TO TOLERATE MASK   Sleep apnea in adult    deviated septum repaired, most recent sleep study was negative   Status post total right knee replacement 06/01/2015   Past Surgical History:  Past Surgical History:  Procedure Laterality Date    ANKLE FUSION Right 05/05/2012   related to his arthritis   ANKLE FUSION Left 05/25/2013   related to his arthritis   ANKLE SURGERY Bilateral    APPLICATION OF CRANIAL NAVIGATION N/A 04/11/2022   Procedure: APPLICATION OF CRANIAL NAVIGATION;  Surgeon: Consuella Lose, MD;  Location: [place];  Service: Neurosurgery;  Laterality: N/A;   CARDIAC CATHETERIZATION N/A 05/01/2015   Procedure: Left Heart Cath and Coronary Angiography;  Surgeon: Belva Crome, MD;  Location: [place] [facility];  Service: Cardiovascular;  Laterality: N/A;   CRANIOTOMY N/A 04/11/2022   Procedure: STEREOTACTIC SUBOCCIPITAL CRANIOTOMY FOR RESECTION OF ARTERIO-VENOUS MALFORMATION;  Surgeon: Consuella Lose, MD;  Location: [place];  Service: Neurosurgery;  Laterality: N/A;   FRACTURE SURGERY     left femur fracture x 3   IR ANGIO EXTERNAL CAROTID SEL EXT CAROTID BILAT MOD SED  01/28/2022   IR ANGIO INTRA EXTRACRAN SEL INTERNAL CAROTID BILAT MOD SED  01/28/2022   IR ANGIO INTRA EXTRACRAN SEL INTERNAL CAROTID BILAT MOD SED  04/16/2022   IR ANGIO VERTEBRAL SEL VERTEBRAL BILAT MOD SED  01/28/2022   IR ANGIO VERTEBRAL SEL VERTEBRAL BILAT MOD SED  04/16/2022   KNEE ARTHROSCOPY     right x 4   LUMBAR LAMINECTOMY/DECOMPRESSION MICRODISCECTOMY N/A 07/18/2022   Procedure: Repair of Pseudomeningiocele posterior;  Surgeon: Consuella Lose, MD;  Location: [place];  Service: Neurosurgery;  Laterality: N/A;   NASAL SEPTOPLASTY W/ TURBINOPLASTY  06/25/2011   Procedure: NASAL SEPTOPLASTY WITH TURBINATE REDUCTION;  Surgeon: Jerrell Belfast, MD;  Location: [place];  Service: ENT;  Laterality: Bilateral;   PLACEMENT OF LUMBAR DRAIN N/A 07/18/2022   Procedure: PLACEMENT OF LUMBAR DRAIN;  Surgeon: Consuella Lose, MD;  Location: [place];  Service: Neurosurgery;  Laterality: N/A;   TONSILLECTOMY     TOTAL KNEE ARTHROPLASTY Right 06/01/2015   Procedure: RIGHT TOTAL KNEE ARTHROPLASTY;  Surgeon: Mcarthur Rossetti, MD;  [facility];   Service: Orthopedics;  Laterality: Right;  Block+general   HPI:  62 y.o. male presents to [facility] hospital on 07/28/2022 with slurred speech, worsening gait and HA. Pt recently discharged 07/25/2022 after operative repair of pseudomeningiocele. MRI brain 3/25 demonstrates acute R cerebellar infarct. PMH: for CAD, R TKA, cardiomyopathy, chronic systolic heart failure, macular degeneration.   Assessment / Plan / Recommendation Clinical Impression  Pt presents with a moderate dysarthria marked by imprecise articulation, reduced respiratory support and hypohonia. His language abilities are intact to express his needs, thoughts and comprehend information. Cognitively he scored a 22/30 on the SLUMS indicative of mild cognitive impairments. He was able to recall all 5 words. Deficits noted in the areas of problem solving, divergent naming of simple category, attention to backward digit recall. Pt would benefit from continued ST on rehab and CIR coordinator stated that he will be admitted to rehab today. Educated pt and wife re: strategies to increase respiratory support and articulatory precision. Will defer goals and plan to ST on CIR at this time.    SLP Assessment  SLP Recommendation/Assessment: Patient needs continued Speech Lanaguage Pathology Services SLP Visit Diagnosis: Dysphagia, oropharyngeal phase (R13.12)    Recommendations for follow up therapy are one component of a multi-disciplinary discharge planning process, led by the attending physician.  Recommendations may be updated based on patient status, additional functional criteria and insurance authorization.    Follow Up Recommendations  Acute inpatient rehab (3hours/day)    Assistance Recommended at Discharge  Frequent or constant Supervision/Assistance  Functional Status Assessment Patient has had a recent decline in their functional status and demonstrates the ability to make significant improvements in function in a reasonable and predictable  amount of time.  Frequency and Duration min 2x/week  2 weeks      SLP Evaluation Cognition  Overall Cognitive Status: Impaired/Different from baseline Arousal/Alertness: Awake/alert Orientation Level: Oriented to person;Oriented to place;Oriented to situation (oriented to year, not day of week) Year: 2024 Day of Week: Incorrect Attention: Sustained Sustained Attention: Appears intact Memory: Appears intact (recalled 5/5 words) Awareness: Impaired Awareness Impairment: Intellectual impairment;Anticipatory impairment Problem Solving: Impaired Problem Solving Impairment: Verbal basic Safety/Judgment: Impaired       Comprehension  Auditory Comprehension Overall Auditory Comprehension: Appears within functional limits for tasks assessed Visual Recognition/Discrimination Discrimination: Not tested Reading Comprehension Reading Status: Not tested    Expression Expression Primary Mode of Expression: Verbal Verbal Expression Overall Verbal Expression: Appears within functional limits for tasks assessed Pragmatics: No impairment Written Expression Dominant Hand: Right Written Expression: Not tested   Oral / Motor  Oral Motor/Sensory Function Overall Oral Motor/Sensory Function: Mild impairment Facial ROM: Reduced left;Suspected CN VII (facial) dysfunction Facial Symmetry: Suspected CN VII (facial) dysfunction;Abnormal symmetry left Facial Strength: Reduced left;Suspected CN VII (facial) dysfunction Motor Speech Overall Motor Speech: Impaired Respiration: Impaired Level of Impairment: Sentence Phonation: Low vocal intensity Resonance: Within functional limits Articulation: Impaired Level of Impairment: Word Intelligibility: Intelligibility reduced Word: 75-100% accurate Phrase: 50-74% accurate Sentence: 50-74% accurate Conversation: 25-49% accurate Motor Planning: Impaired Level of Impairment: Phrase Effective Techniques: Over-articulate;Increased vocal intensity;Slow  rate  Houston Siren 07/31/2022, 10:50 AM

## 2022-07-31 NOTE — Discharge Summary (Signed)
Physician Discharge Summary   Patient: Christopher Yu MRN: [id number] DOB: 1961-03-17  Admit date:     07/28/2022  Discharge date: 07/31/22  Discharge Physician: Annita Brod   PCP: Charlane Ferretti, MD   Recommendations at discharge:   Patient being discharged to inpatient rehab New medications: IV Decadron 4 mg every 8 hours x 3 days, then decrease down to 2 mg every 8 hours x 3 days then decrease down to 1 mg every 8 hours x 3 days then decrease down to 1 mg twice a day x 3 days then decrease down to 1 mg once a day x 3 days. Patient's home rheumatologic medications of rituximab, methotrexate and sulfasalazine may be resumed once steroids are completed.  Discharge Diagnoses: Principal Problem:   Ischemic cerebrovascular accident (CVA) ([place]) Active Problems:   CAD (coronary artery disease), native coronary artery   Chronic systolic heart failure (HCC)   Rheumatoid arthritis (HCC)   Prolonged QT interval   Acute CVA (cerebrovascular accident) ([place])   Protein-calorie malnutrition, severe  Resolved Problems:   * No resolved hospital problems. [facility] Course: Patient is a 62 year old with past medical history of rheumatoid arthritis, CAD, OSA with CPAP intolerance, systolic/diastolic heart failure with ejection fraction of 40 to 45%, depression and recent repair of pseudomeningocele on 3/15 (discharged on 3/22) who presented to the emergency department on 3/25 dysarthria and gait difficulty.    Admitted to hospitalist service with neurology and neurosurgery consult.  MRI brain on admission noted improvement in small pseudomeningocele and no significant hemorrhage in the surgical cavity.  There is some right cerebellar flair noted which may be affecting patient's language.  Neurosurgery felt no need for intervention, but would benefit from steroids and patient started on dexamethasone.  Patient seen by physical and Occupational Therapy who recommended inpatient rehab of  which consult is pending.  Patient seen by speech therapy put him on dysphagia 2 diet with nectar thick liquids.  Assessment and Plan: Dysarthria and gait ataxia:  Appreciate neurosurgery and neurology help.  Stroke workup unrevealing.  A1c and LDL unremarkable.  Neurology recommends starting aspirin.  Neurosurgery with steroid taper. Seen by PT and OT and patient accepted for inpatient rehab.   Prediabetes A1c at 6.0.   Dysphagia Seen by speech therapy and put on restricted diet.  They will continue to follow patient to inpatient rehab   History of left cerebellar AVM resection 12/23. Pseudomeningocele repair and lumbar drain placement on 3/15. Appreciate neurosurgery consultation. Presentation most likely appear to be postop changes. Currently on Decadron with taper   OSA. Noncompliance with CPAP. Placing the patient at high risk of poor outcome.   Chronic systolic/diastolic heart failure Echocardiogram notes ejection fraction of 40 to 45% and grade 1 diastolic dysfunction.  Looks to be euvolemic, and BNP on day of discharge at 22.  Continue aspirin   Hyperlipidemia On Crestor continue.   Depression. Stable, continue Zoloft  Overweight Meets criteria BMI greater than 25   History of rheumatoid arthritis. On methotrexate, sulfasalazine, rituximab. Currently holding on medication. Receiving steroids.   Patient was on gabapentin. Currently on hold.        Consultants:  -Neurosurgery -Neurology -Inpatient rehab  Procedures performed: Echocardiogram done 3/26 notes actually improvement in ejection fraction now at 40 to [id number] with diastolic dysfunction Disposition: Inpatient rehab Diet recommendation:  Dysphagia 2 diet regular with nectar thick liquids DISCHARGE MEDICATION: Allergies as of 07/31/2022   No Known Allergies  Medication List     ASK your doctor about these medications    aspirin 81 MG tablet Take 81 mg by mouth daily.   CALCIUM PO Take  1 tablet by mouth daily.   clindamycin 150 MG capsule Commonly known as: CLEOCIN Take 600 mg by mouth See admin instructions. Take 600mg  by mouth an hour prior to dental appointment.   diazepam 5 MG tablet Commonly known as: VALIUM Take 1 tablet (5 mg total) by mouth every 8 (eight) hours as needed for muscle spasms.   fluticasone 50 MCG/ACT nasal spray Commonly known as: FLONASE Place 1 spray into both nostrils daily as needed for allergies or rhinitis.   gabapentin 300 MG capsule Commonly known as: NEURONTIN Take 300 mg by mouth 2 (two) times daily.   HYDROcodone-acetaminophen 5-325 MG tablet Commonly known as: NORCO/VICODIN Take 1 tablet by mouth every 4 (four) hours as needed for up to 7 days for moderate pain.   lisinopril 10 MG tablet Commonly known as: ZESTRIL TAKE 1 TABLET DAILY   methotrexate 2.5 MG tablet Commonly known as: RHEUMATREX Take 20 mg by mouth once a week. Takes 8 tablets(20mg ) every Saturday.     Caution:Chemotherapy. Protect from light.   metoprolol succinate 25 MG 24 hr tablet Commonly known as: TOPROL-XL TAKE 1 TABLET DAILY   predniSONE 5 MG tablet Commonly known as: DELTASONE Take 20 mg by mouth daily as needed (Arthritis).   PRESERVISION AREDS 2 PO Take 2 capsules by mouth daily.   Rituxan 500 MG/50ML injection Generic drug: riTUXimab See admin instructions. Every 6 months 2 dose infusion Ruxience   rosuvastatin 20 MG tablet Commonly known as: CRESTOR Take 1 tablet (20 mg total) by mouth daily.   sertraline 50 MG tablet Commonly known as: ZOLOFT Take 50 mg by mouth every morning.   spironolactone 25 MG tablet Commonly known as: ALDACTONE TAKE 1 TABLET DAILY   sulfaSALAzine 500 MG tablet Commonly known as: AZULFIDINE Take 1,000 mg by mouth 2 (two) times daily.   Systane Complete 0.6 % Soln Generic drug: Propylene Glycol Place 1 drop into both eyes 2 (two) times daily.   traMADol 50 MG tablet Commonly known as: ULTRAM Take  50 mg by mouth every 6 (six) hours as needed for moderate pain or severe pain.   Vitamin D3 50 MCG (2000 UT) Tabs Take 2,000 Units by mouth daily.        Discharge Exam: Filed Weights   07/28/22 1615  Weight: 90.3 kg   General: Alert and oriented x 2-3, no acute distress, continue dysarthric speech Cardiovascular: Regular rate and rhythm, S1-S2  Condition at discharge: improving  The results of significant diagnostics from this hospitalization (including imaging, microbiology, ancillary and laboratory) are listed below for reference.   Imaging Studies: DG Swallowing Func-Speech Pathology  Result Date: 07/30/2022 Table formatting from the original result was not included. Modified Barium Swallow Study Patient Details Name: Ivery Hovsepian MRN: [id number] Date of Birth: 07-Jul-1960 Today's Date: 07/30/2022 HPI/PMH: HPI: 62 y.o. male presents to [facility] hospital on 07/28/2022 with slurred speech, worsening gait and HA. Pt recently discharged 07/25/2022 after operative repair of pseudomeningiocele. MRI brain 3/25 demonstrates acute R cerebellar infarct. PMH: for CAD, R TKA, cardiomyopathy, chronic systolic heart failure, macular degeneration. Clinical Impression: Clinical Impression: Pt demonstrated minimal oral dysphagia with trace lingual residue present intermittently and mild pharyngeal dysphagia. Pt exhibited decreased laryngeal elevation with thin barium entering airway prior to full laryngeal closure resulting in penetration remaining on the anterior  portion of vestibule. A chin tuck posture was not effective and he continued to penetrate with spontaeous cough that expelled penetrates. A head turn to left with thin resulted in sensed aspiration with weak cough. Nectar thick was also penetrated during and after the swallow and chin tuck was ineffective however a head turn to the left was 90% effective to prevent penetration over multiple trials. Reduced tonuge base retraction led to  minimal vallecular residue consistently. Pt was able to masticate solid texture with flash penetration. Given deconditioning recommend he start a more conservative diet texture of Dys 2 (minced), nectar thick with head turn to the left with liquids, intermittent cough/throat clear, no straws, small sips and initiate meds crushed in puree (can advance to whole in puree as able). Factors that may increase risk of adverse event in presence of aspiration ([place] 2021): No data recorded Recommendations/Plan: Swallowing Evaluation Recommendations Swallowing Evaluation Recommendations Recommendations: PO diet PO Diet Recommendation: Dysphagia 2 (Finely chopped); Mildly thick liquids (Level 2, nectar thick) Liquid Administration via: Cup; No straw Medication Administration: Crushed with puree Supervision: Staff to assist with self-feeding; Full supervision/cueing for swallowing strategies Swallowing strategies  : Head turn left during swallowing (with liquids) Postural changes: Position pt fully upright for meals Oral care recommendations: Oral care BID (2x/day) Treatment Plan Treatment Plan Treatment recommendations: Therapy as outlined in treatment plan below Follow-up recommendations: Acute inpatient rehab (3 hours/day) Functional status assessment: Patient has had a recent decline in their functional status and demonstrates the ability to make significant improvements in function in a reasonable and predictable amount of time. Treatment frequency: Min 2x/week Treatment duration: 2 weeks Interventions: Aspiration precaution training; Patient/family education; Compensatory techniques; Trials of upgraded texture/liquids Recommendations Recommendations for follow up therapy are one component of a multi-disciplinary discharge planning process, led by the attending physician.  Recommendations may be updated based on patient status, additional functional criteria and insurance authorization. Assessment: Orofacial  Exam: Orofacial Exam Oral Cavity: Oral Hygiene: WFL Oral Cavity - Dentition: Adequate natural dentition Orofacial Anatomy: Other (comment) Oral Motor/Sensory Function: Suspected cranial nerve impairment CN V - Trigeminal: WFL CN VII - Facial: Left motor impairment CN IX - Glossopharyngeal, CN X - Vagus: Not tested Anatomy: Anatomy: WFL Boluses Administered: Boluses Administered Boluses Administered: Thin liquids (Level 0); Mildly thick liquids (Level 2, nectar thick); Moderately thick liquids (Level 3, honey thick); Puree; Solid  Oral Impairment Domain: Oral Impairment Domain Lip Closure: No labial escape Tongue control during bolus hold: Cohesive bolus between tongue to palatal seal Bolus preparation/mastication: Timely and efficient chewing and mashing Bolus transport/lingual motion: Brisk tongue motion Oral residue: Trace residue lining oral structures Location of oral residue : Tongue Initiation of pharyngeal swallow : Pyriform sinuses  Pharyngeal Impairment Domain: Pharyngeal Impairment Domain Soft palate elevation: No bolus between soft palate (SP)/pharyngeal wall (PW) Laryngeal elevation: Partial superior movement of thyroid cartilage/partial approximation of arytenoids to epiglottic petiole Anterior hyoid excursion: Partial anterior movement Epiglottic movement: Complete inversion Laryngeal vestibule closure: Incomplete, narrow column air/contrast in laryngeal vestibule Pharyngeal stripping wave : Present - complete Pharyngeal contraction (A/P view only): N/A Pharyngoesophageal segment opening: Complete distension and complete duration, no obstruction of flow Tongue base retraction: Trace column of contrast or air between tongue base and PPW Pharyngeal residue: Collection of residue within or on pharyngeal structures Location of pharyngeal residue: Valleculae  Esophageal Impairment Domain: Esophageal Impairment Domain Esophageal clearance upright position: Complete clearance, esophageal coating Pill:  Esophageal Impairment Domain Esophageal clearance upright position: Complete clearance, esophageal  coating Penetration/Aspiration Scale Score: Penetration/Aspiration Scale Score 2.  Material enters airway, remains ABOVE vocal cords then ejected out: Thin liquids (Level 0) 3.  Material enters airway, remains ABOVE vocal cords and not ejected out: Thin liquids (Level 0) 4.  Material enters airway, CONTACTS cords then ejected out: Mildly thick liquids (Level 2, nectar thick) Compensatory Strategies: Compensatory Strategies Compensatory strategies: Yes Chin tuck: Ineffective Ineffective Chin Tuck: Thin liquid (Level 0); Mildly thick liquid (Level 2, nectar thick) Left head turn: Effective (ineffective with thin, effective with nectar) Effective Left Head Turn: Mildly thick liquid (Level 2, nectar thick) Ineffective Left Head Turn: Thin liquid (Level 0)   General Information: Caregiver present: No  Diet Prior to this Study: NPO   Temperature : Normal   Respiratory Status: WFL   Supplemental O2: None (Room air)   History of Recent Intubation: No  Behavior/Cognition: Alert; Cooperative; Pleasant mood Self-Feeding Abilities: Able to self-feed Baseline vocal quality/speech: Hypophonia/low volume Volitional Cough: Able to elicit Volitional Swallow: Able to elicit No data recorded Goal Planning: Prognosis for improved oropharyngeal function: Good No data recorded No data recorded No data recorded Consulted and agree with results and recommendations: Patient; Family member/caregiver Pain: Pain Assessment Pain Assessment: No/denies pain Pain Score: 5 Faces Pain Scale: 4 Pain Location: Headache Pain Descriptors / Indicators: Grimacing Pain Intervention(s): Monitored during session End of Session: Start Time:SLP Start Time (ACUTE ONLY): 1038 Stop Time: SLP Stop Time (ACUTE ONLY): 1051 Time Calculation:SLP Time Calculation (min) (ACUTE ONLY): 13 min Charges: SLP Evaluations $ SLP Speech Visit: 1 Visit SLP Evaluations $BSS Swallow:  1 Procedure $MBS Swallow: 1 Procedure SLP visit diagnosis: SLP Visit Diagnosis: Dysphagia, oropharyngeal phase (R13.12) Past Medical History: Past Medical History: Diagnosis Date  Anxiety   Arthritis, rheumatoid ([place])   dx 1988  CAD (coronary artery disease), native coronary artery   Mild LAD disease with calcification noted at cath 12//27/16   Cardiomyopathy ([place])   Cardiomyopathy ([place])   Chronic systolic heart failure ([place]) 05/01/2015  Depression   Deviated nasal septum 06/25/2011  Headache   High cholesterol   Hyperlipidemia   Impingement syndrome of left shoulder 12/22/2017  Macular degeneration   Rheumatoid aortitis   Sleep apnea   does not use cpap - UNABLE TO TOLERATE MASK  Sleep apnea in adult   deviated septum repaired, most recent sleep study was negative  Status post total right knee replacement 06/01/2015 Past Surgical History: Past Surgical History: Procedure Laterality Date  ANKLE FUSION Right 05/05/2012  related to his arthritis  ANKLE FUSION Left 05/25/2013  related to his arthritis  ANKLE SURGERY Bilateral   APPLICATION OF CRANIAL NAVIGATION N/A [id number]  Procedure: APPLICATION OF CRANIAL NAVIGATION;  Surgeon: Consuella Lose, MD;  Location: [facility];  Service: Neurosurgery;  Laterality: N/A;  CARDIAC CATHETERIZATION N/A 05/01/2015  Procedure: Left Heart Cath and Coronary Angiography;  Surgeon: Belva Crome, MD;  Location: [facility];  Service: Cardiovascular;  Laterality: N/A;  CRANIOTOMY N/A 04/11/2022  Procedure: STEREOTACTIC SUBOCCIPITAL CRANIOTOMY FOR RESECTION OF ARTERIO-VENOUS MALFORMATION;  Surgeon: Consuella Lose, MD;  Location: [facility];  Service: Neurosurgery;  Laterality: N/A;  FRACTURE SURGERY    left femur fracture x 3  IR ANGIO EXTERNAL CAROTID SEL EXT CAROTID BILAT MOD SED  01/28/2022  IR ANGIO INTRA EXTRACRAN SEL INTERNAL CAROTID BILAT MOD SED  01/28/2022  IR ANGIO INTRA EXTRACRAN SEL INTERNAL CAROTID BILAT MOD SED  04/16/2022  IR ANGIO VERTEBRAL SEL VERTEBRAL BILAT MOD  SED  01/28/2022  IR ANGIO  VERTEBRAL SEL VERTEBRAL BILAT MOD SED  04/16/2022  KNEE ARTHROSCOPY    right x 4  LUMBAR LAMINECTOMY/DECOMPRESSION MICRODISCECTOMY N/A 07/18/2022  Procedure: Repair of Pseudomeningiocele posterior;  Surgeon: Consuella Lose, MD;  Location: [facility];  Service: Neurosurgery;  Laterality: N/A;  NASAL SEPTOPLASTY W/ TURBINOPLASTY  06/25/2011  Procedure: NASAL SEPTOPLASTY WITH TURBINATE REDUCTION;  Surgeon: Jerrell Belfast, MD;  Location: [facility];  Service: ENT;  Laterality: Bilateral;  PLACEMENT OF LUMBAR DRAIN N/A 07/18/2022  Procedure: PLACEMENT OF LUMBAR DRAIN;  Surgeon: Consuella Lose, MD;  Location: [facility];  Service: Neurosurgery;  Laterality: N/A;  TONSILLECTOMY    TOTAL KNEE ARTHROPLASTY Right 06/01/2015  Procedure: RIGHT TOTAL KNEE ARTHROPLASTY;  Surgeon: Mcarthur Rossetti, MD;  Location: [facility];  Service: Orthopedics;  Laterality: Right;  Block+general [place] Siren 07/30/2022, 12:27 PM  ECHOCARDIOGRAM COMPLETE  Result Date: 07/29/2022    ECHOCARDIOGRAM REPORT   Patient Name:   TARY DIEUDONNE Date of Exam: 07/29/2022 Medical Rec #:  [id number]                   Height:       73.0 in Accession #:    [id number]                  Weight:       199.0 lb Date of Birth:  1960-06-11                   BSA:          2.147 m Patient Age:    47 years                    BP:           124/103 mmHg Patient Gender: M                           HR:           93 bpm. Exam Location:  Inpatient Procedure: 2D Echo, Cardiac Doppler and Color Doppler Indications:    Stroke I63.9  History:        Patient has prior history of Echocardiogram examinations, most                 recent 06/22/2017. Cardiomyopathy and CHF, CAD, Stroke; Risk                 Factors:Sleep Apnea and Dyslipidemia.  Sonographer:    Ronny Flurry Referring Phys: [id number] TIMOTHY S OPYD  Sonographer Comments: No subcostal window. IMPRESSIONS  1. Left ventricular ejection fraction, by estimation, is 40 to 45%. The  left ventricle has mildly decreased function. The left ventricle demonstrates global hypokinesis. Left ventricular diastolic parameters are consistent with Grade I diastolic dysfunction (impaired relaxation).  2. Right ventricular systolic function is normal. The right ventricular size is normal.  3. The mitral valve is normal in structure. No evidence of mitral valve regurgitation.  4. Suspect bicuspid aortic valve. There is mild low flow-low gradient aortic stenosis (gradients are underestimated due to low stroke volume). The aortic valve is bicuspid. There is mild calcification of the aortic valve. There is moderate thickening of  the aortic valve. Aortic valve regurgitation is not visualized. Mild aortic valve stenosis. Aortic valve mean gradient measures 7.0 mmHg. Aortic valve Vmax measures 1.78 m/s.  5. Aortic dilatation noted. There is moderate dilatation of the aortic root, measuring 45 mm. Comparison(s): Prior images unable  to be directly viewed, comparison made by report only. The aortic valve is identified as bicuspid, otherwise no change. FINDINGS  Left Ventricle: Left ventricular ejection fraction, by estimation, is 40 to 45%. The left ventricle has mildly decreased function. The left ventricle demonstrates global hypokinesis. The left ventricular internal cavity size was normal in size. There is  borderline concentric left ventricular hypertrophy. Left ventricular diastolic parameters are consistent with Grade I diastolic dysfunction (impaired relaxation). Normal left ventricular filling pressure. Right Ventricle: The right ventricular size is normal. No increase in right ventricular wall thickness. Right ventricular systolic function is normal. Left Atrium: Left atrial size was normal in size. Right Atrium: Right atrial size was normal in size. Pericardium: There is no evidence of pericardial effusion. Mitral Valve: The mitral valve is normal in structure. No evidence of mitral valve regurgitation.  Tricuspid Valve: The tricuspid valve is normal in structure. Tricuspid valve regurgitation is not demonstrated. Aortic Valve: Suspect bicuspid aortic valve. There is mild low flow-low gradient aortic stenosis (gradients are underestimated due to low stroke volume). The aortic valve is bicuspid. There is mild calcification of the aortic valve. There is moderate thickening of the aortic valve. Aortic valve regurgitation is not visualized. Mild aortic stenosis is present. Aortic valve mean gradient measures 7.0 mmHg. Aortic valve peak gradient measures 12.7 mmHg. Aortic valve area, by VTI measures 1.97 cm. Pulmonic Valve: The pulmonic valve was grossly normal. Pulmonic valve regurgitation is not visualized. Aorta: Aortic dilatation noted. There is moderate dilatation of the aortic root, measuring 45 mm. IAS/Shunts: No atrial level shunt detected by color flow Doppler.  LEFT VENTRICLE PLAX 2D LVIDd:         4.60 cm LVIDs:         3.20 cm LV PW:         1.10 cm LV IVS:        1.20 cm LVOT diam:     2.60 cm LV SV:         60 LV SV Index:   28 LVOT Area:     5.31 cm  RIGHT VENTRICLE RV S prime:     12.90 cm/s LEFT ATRIUM             Index        RIGHT ATRIUM           Index LA diam:        3.60 cm 1.68 cm/m   RA Area:     15.70 cm LA Vol (A2C):   39.7 ml 18.49 ml/m  RA Volume:   31.45 ml  14.65 ml/m LA Vol (A4C):   31.0 ml 14.44 ml/m LA Biplane Vol: 36.5 ml 17.00 ml/m  AORTIC VALVE AV Area (Vmax):    2.01 cm AV Area (Vmean):   1.93 cm AV Area (VTI):     1.97 cm AV Vmax:           178.50 cm/s AV Vmean:          121.500 cm/s AV VTI:            0.302 m AV Peak Grad:      12.7 mmHg AV Mean Grad:      7.0 mmHg LVOT Vmax:         67.42 cm/s LVOT Vmean:        44.100 cm/s LVOT VTI:          0.112 m LVOT/AV VTI ratio: 0.37  AORTA Ao Root diam: 4.55 cm Ao  Asc diam:  4.20 cm MITRAL VALVE MV Area (PHT): 3.77 cm    SHUNTS MV Decel Time: 201 msec    Systemic VTI:  0.11 m MV E velocity: 38.00 cm/s  Systemic Diam: 2.60 cm MV  A velocity: 75.10 cm/s MV E/A ratio:  0.51 Mihai Croitoru MD Electronically signed by Sanda Klein MD Signature Date/Time: 07/29/2022/4:13:22 PM    Final    CT ANGIO HEAD NECK W WO CM  Result Date: 07/29/2022 CLINICAL DATA:  Stroke, determine embolic source EXAM: CT ANGIOGRAPHY HEAD AND NECK TECHNIQUE: Multidetector CT imaging of the head and neck was performed using the standard protocol during bolus administration of intravenous contrast. Multiplanar CT image reconstructions and MIPs were obtained to evaluate the vascular anatomy. Carotid stenosis measurements (when applicable) are obtained utilizing NASCET criteria, using the distal internal carotid diameter as the denominator. RADIATION DOSE REDUCTION: This exam was performed according to the departmental dose-optimization program which includes automated exposure control, adjustment of the mA and/or kV according to patient size and/or use of iterative reconstruction technique. CONTRAST:  26mL OMNIPAQUE IOHEXOL 350 MG/ML SOLN COMPARISON:  Brain MRI from yesterday FINDINGS: CT HEAD FINDINGS Brain: Pseudomeningocele with recent repair and posterior fossa gas along the upper cerebellum. Infarct by MRIs difficult to separate from chronic low-density in the right more than left cerebral hemisphere. Unchanged collection with mass effect asymmetrically to the left cerebral hemisphere. No hydrocephalus or acute hemorrhage. Vascular: See below Skull: Remote suboccipital craniectomy. Sinuses/Orbits: Negative Review of the MIP images confirms the above findings CTA NECK FINDINGS Aortic arch: Atheromatous plaque with 3 vessel branching. Right carotid system: Mild atheromatous plaque at the bifurcation. No ulceration or significant stenosis Left carotid system: Mild atheromatous plaque at the bifurcation. No ulceration or significant stenosis. Vertebral arteries: No proximal subclavian stenosis. Mild atheromatous plaque at the right vertebral origin. No beading or  ulceration. Extrinsic narrowing of the left vertebral artery at C5-6 due to facet spurring from behind, narrowing measuring 60% on sagittal reformats. Skeleton: Ordinary cervical spine degeneration. Other neck: No acute finding. Known dissecting fluid collection spanning from the suboccipital craniectomy inferiorly into the neck. Upper chest: Clear apical lungs. Review of the MIP images confirms the above findings CTA HEAD FINDINGS Anterior circulation: Mild calcified plaque along the carotid siphons. No stenosis, branch occlusion, beading, or aneurysm Posterior circulation: The vertebral and basilar arteries are smoothly contoured and widely patent. Symmetrically enhancing superior cerebellar arteries without visible occlusion or irregularity. Symmetric and smoothly contoured posterior cerebral arteries. Venous sinuses: Unremarkable for arterial timing. Anatomic variants: None significant Review of the MIP images confirms the above findings IMPRESSION: 1. No vascular lesion seen underlying the cerebellar infarct. 2. Chronic 60% narrowing of the left vertebral artery due to extrinsic compression by C5-6 left facet osteophyte. 3. Limited atheromatous change for age. Electronically Signed   By: Jorje Guild M.D.   On: 07/29/2022 05:06   MR Brain Wo Contrast (neuro protocol)  Result Date: 07/28/2022 CLINICAL DATA:  Neuro deficit, acute, stroke suspected. EXAM: MRI HEAD WITHOUT CONTRAST TECHNIQUE: Multiplanar, multiecho pulse sequences of the brain and surrounding structures were obtained without intravenous contrast. COMPARISON:  Head CT 07/28/2022.  MRI brain 07/05/2022. FINDINGS: Brain: New small focus of acute infarction in the right superior cerebellar hemisphere with surrounding area of increased T2 signal and cystic change, which is new since the prior MRI dated 07/05/2022 and suspicious for now chronic infarct. Postoperative changes of prior left posterior fossa resection. Redemonstrated foci of gas  within the resection  cavity, fourth ventricle and extra-axial spaces, likely secondary to recent pseudomeningocele repair and/or lumbar drain placement/removal. No acute intracranial hemorrhage.  No hydrocephalus. Vascular: Normal flow voids. Skull and upper cervical spine: Suboccipital craniectomy. Sinuses/Orbits: Trace fluid in the right sphenoid sinus. Other: Decreased size of the fluid collection in the dorsal soft tissues overlying the suboccipital craniectomy. IMPRESSION: 1. New small focus of acute infarction in the right superior cerebellar hemisphere with surrounding area of increased T2 signal and cystic change, which is new since the prior MRI dated 07/05/2022 and suspicious for now chronic infarct. 2. Postoperative changes of prior left posterior fossa resection. Redemonstrated foci of gas within the resection cavity, fourth ventricle and extra-axial spaces, likely secondary to recent pseudomeningocele repair and/or lumbar drain placement/removal. 3. Decreased size of the fluid collection in the dorsal soft tissues overlying the suboccipital craniectomy. Electronically Signed   By: Emmit Alexanders M.D.   On: 07/28/2022 20:06   CT Head Wo Contrast  Result Date: 07/28/2022 CLINICAL DATA:  Headache, neuro deficit. EXAM: CT HEAD WITHOUT CONTRAST TECHNIQUE: Contiguous axial images were obtained from the base of the skull through the vertex without intravenous contrast. RADIATION DOSE REDUCTION: This exam was performed according to the departmental dose-optimization program which includes automated exposure control, adjustment of the mA and/or kV according to patient size and/or use of iterative reconstruction technique. COMPARISON:  Head CT 04/29/2022.  MRI brain 07/05/2022. 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 New pneumocephalus in the resection cavity, likely secondary to recent  pseudomeningocele repair and lumbar drain placement/removal. No acute intracranial hemorrhage, mass effect or midline shift. No hydrocephalus. Vascular: No hyperdense vessel or unexpected calcification. Skull: Suboccipital craniectomy. Sinuses/Orbits: Trace fluid in the sphenoid sinus. Orbits are unremarkable. Other: Partially imaged fluid collection in the dorsal soft tissues overlying the suboccipital craniectomy, likely residual or recurrent pseudomeningocele. IMPRESSION: 1. 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 New pneumocephalus in the resection cavity, likely secondary to recent pseudomeningocele repair and lumbar drain placement/removal. No acute intracranial hemorrhage. 2. Partially imaged fluid collection in the dorsal soft tissues overlying the suboccipital craniectomy, likely residual or recurrent pseudomeningocele. Electronically Signed   By: Emmit Alexanders M.D.   On: 07/28/2022 17:15   DG Lumbar Spine 1 View  Result Date: 07/18/2022 CLINICAL DATA:  [id number] Surgery, elective [id number] EXAM: LUMBAR SPINE - 1 VIEW COMPARISON:  None Available. FINDINGS: Single intraoperative image during lumbar drain placement. IMPRESSION: Single intraoperative image during lumbar drain placement. Electronically Signed   By: Maurine Simmering M.D.   On: 07/18/2022 12:42   DG C-Arm 1-60 Min-No Report  Result Date: 07/18/2022 Fluoroscopy was utilized by the requesting physician.  No radiographic interpretation.   MR BRAIN WO CONTRAST  Result Date: 07/05/2022 CLINICAL DATA:  Acquired pseudomeningocele EXAM: MRI HEAD WITHOUT CONTRAST TECHNIQUE: Multiplanar, multiecho pulse sequences of the brain and surrounding structures were obtained without intravenous contrast. COMPARISON:  12/08/2021 FINDINGS: Brain: History of cerebellar AVM treatment. Suboccipital craniectomy is present with large CSF intensity collection in the posterior  scalp and neck measuring 9 cm craniocaudal and 7.5 cm in thickness. Contiguous collection in the left and superior posterior fossa. Fourth ventricle and foramen magnum are patent. No brain sagging. No acute infarct, hemorrhage, hydrocephalus, or collection. Susceptibility artifact and gliosis around the left cerebellar surgical site. Vascular: Major flow voids and vascular enhancements are preserved. Skull and upper cervical spine: Suboccipital craniotomy as described.  Sinuses/Orbits: Negative IMPRESSION: 9 x 7 cm suboccipital pseudomeningocele at site of craniotomy. Some intracranial extension along the postoperative left cerebellum. No hydrocephalus or foramen magnum stenosis. Electronically Signed   By: Jorje Guild M.D.   On: 07/05/2022 09:10    Microbiology: Results for orders placed or performed during the hospital encounter of 07/18/22  MRSA Next Gen by PCR, Nasal     Status: None   Collection Time: 07/18/22  4:09 PM   Specimen: Nasal Mucosa; Nasal Swab  Result Value Ref Range Status   MRSA by PCR Next Gen NOT DETECTED NOT DETECTED Final    Comment: (NOTE) The GeneXpert MRSA Assay (FDA approved for NASAL specimens only), is one component of a comprehensive MRSA colonization surveillance program. It is not intended to diagnose MRSA infection nor to guide or monitor treatment for MRSA infections. Test performance is not FDA approved in patients less than 59 years old. Performed at [facility], [place] [address] [place], [place] [address]     Labs: CBC: Recent Labs  Lab 07/28/22 1644 07/30/22 0401 07/31/22 0640  WBC 10.9* 7.7 10.2  NEUTROABS 8.2*  --   --   HGB 11.5* 10.7* 10.4*  HCT 38.0* 34.7* 31.7*  MCV 76.0* 74.5* 72.5*  PLT 240 231 [id number]   Basic Metabolic Panel: Recent Labs  Lab 07/28/22 1644 07/29/22 0156 07/29/22 0200 07/30/22 0401 07/31/22 0640  NA 139 141  --  139 140  K 4.5 3.9  --  3.7 3.5  CL 100 102  --  99 102  CO2 27 27  --  28 28  GLUCOSE 116*  90  --  120* 125*  BUN 11 10  --  9 16  CREATININE 0.92 0.92  --  0.78 0.82  CALCIUM 9.5 9.2  --  9.2 9.1  MG  --   --  1.6* 1.9 2.0   Liver Function Tests: Recent Labs  Lab 07/28/22 1644  AST 23  ALT 20  ALKPHOS 65  BILITOT 0.4  PROT 7.3  ALBUMIN 3.9   CBG: No results for input(s): "GLUCAP" in the last 168 hours.  Discharge time spent: greater than 30 minutes.  Signed: Annita Brod, MD [facility] 07/31/2022

## 2022-07-31 NOTE — Discharge Instructions (Addendum)
Inpatient Rehab Discharge Instructions  Christopher Yu Discharge date and time: No discharge date for patient encounter.   Activities/Precautions/ Functional Status: Activity: activity as tolerated Diet: regular diet nectar thick liquids Wound Care: routine skin checks Functional status:  ___ No restrictions     ___ Walk up steps independently ___ 24/7 supervision/assistance   ___ Walk up steps with assistance ___ Intermittent supervision/assistance  ___ Bathe/dress independently ___ Walk with walker     _x__ Bathe/dress with assistance ___ Walk Independently    ___ Shower independently ___ Walk with assistance    ___ Shower with assistance ___ No alcohol     ___ Return to work/school ________  Special Instructions: No driving smoking or alcohol  COMMUNITY REFERRALS UPON DISCHARGE:    Outpatient: PT     OT    ST                 Agency: [facility]     Phone: [phone]             Appointment Date/Time: *Please expect follow-up within 7-10 business days to schedule your appointment. If you have not received updates, be sure to contact site directly.*  Medical Equipment/Items Ordered: wheelchair and 3in1 bedside commode                                                 Agency/Supplier: [employer] [phone]     My questions have been answered and I understand these instructions. I will adhere to these goals and the provided educational materials after my discharge from the hospital.  Patient/Caregiver Signature _______________________________ Date __________  Clinician Signature _______________________________________ Date __________  Please bring this form and your medication list with you to all your follow-up doctor's appointments.

## 2022-07-31 NOTE — Progress Notes (Signed)
Inpatient Rehabilitation Admission Medication Review by a Pharmacist  A complete drug regimen review was completed for this patient to identify any potential clinically significant medication issues.  High Risk Drug Classes Is patient taking? Indication by Medication  Antipsychotic No   Anticoagulant Yes Lovenox - VTE ppx  Antibiotic No   Opioid No   Antiplatelet No   Hypoglycemics/insulin No   Vasoactive Medication No   Chemotherapy No   Other Yes Dexamethasone - dysarthria, gait ataxia, RA  Rosuvastatin - HLD Sertraline - Mood Diazepam prn spasms     Type of Medication Issue Identified Description of Issue Recommendation(s)  Drug Interaction(s) (clinically significant)     Duplicate Therapy     Allergy     No Medication Administration End Date     Incorrect Dose     Additional Drug Therapy Needed     Significant med changes from prior encounter (inform family/care partners about these prior to discharge).    Other       Clinically significant medication issues were identified that warrant physician communication and completion of prescribed/recommended actions by midnight of the next day:  No  Pharmacist comments: metoprolol, spironolactone, lisinopril, gabapentin, methotrexate, sulfasalazine on hold (on discharge order medication list to be reconciled).  Time spent performing this drug regimen review (minutes):  30 minutes  Thank you Anette Guarneri, PharmD

## 2022-07-31 NOTE — Plan of Care (Signed)
  Problem: Education: Goal: Knowledge of disease or condition will improve Outcome: Progressing   Problem: Ischemic Stroke/TIA Tissue Perfusion: Goal: Complications of ischemic stroke/TIA will be minimized Outcome: Progressing   Problem: Health Behavior/Discharge Planning: Goal: Ability to manage health-related needs will improve Outcome: Progressing Goal: Goals will be collaboratively established with patient/family Outcome: Progressing   Problem: Nutrition: Goal: Risk of aspiration will decrease Outcome: Progressing Goal: Dietary intake will improve Outcome: Progressing

## 2022-07-31 NOTE — Progress Notes (Signed)
Patient ID: Christopher Yu, male   DOB: 07-04-1960, 62 y.o.   MRN: [id number]  INPATIENT REHABILITATION ADMISSION NOTE   Arrival Method: bed     Mental Orientation: x4   Assessment: see flowsheet   Skin: CDI   IV'S: right forearm/saline locked   Pain: none reported   Tubes and Drains: n/a   Safety Measures: in place   Vital Signs: see flowsheet   Height and Weight: see flowsheet   Rehab Orientation: completed   Family: at bedside

## 2022-07-31 NOTE — Progress Notes (Signed)
Jennye Boroughs, MD  Physician Physical Medicine and Rehabilitation   Consult Note    Signed   Date of Service: 07/30/2022  1:37 PM  Related encounter: ED to Hosp-Admission (Discharged) from 07/28/2022 in [facility]   Signed     Expand All Collapse All  Show:Clear all [x] Written[x] Templated[] Copied  Added by: [x] Jennye Boroughs, MD  [] Hover for details                Physical Medicine and Rehabilitation Consult Reason for Consult:Rehab Referring Physician: Dr. Maryland Pink      HPI: Christopher Yu is a 62 y.o. male with past medical history of CAD, OSA, depression, rheumatoid arthritis, chronic systolic CHF, prior AVM resection December 2023 and pseudomeningocele repair on 07/18/2022 who presented to the ER on 07/28/2022 for altered gait and dysarthria or worsening for about 3 days prior to admission.  Head CT on 07/28/2022 showed similar postoperative appearance of prior suboccipital craniectomy, new pneumocephalus and resection cavity.  MRI brain 07/28/2022 showed new small focus of acute infarction right superior cerebellar hemisphere.  He was seen by Dr. Duffy Rhody neurosurgery and neurology Dr. Leonie Man.  Neurosurgery reports no neurosurgical intervention is indicated.  He was noted to have some increased cerebellar FLAIR signal indicating possible cerebellar dysfunction.  Neurology and neurosurgery feel that small DWI hyperintensity not felt to be typical of stroke and more likely to be postoperative change.  He was started on steroids with dexamethasone taper.  He was seen by speech therapist and started on dysphagia 2/nectar diet.  He has been continued on aspirin 81 mg daily and aggressive risk factor modification recommended.  Patient reported to have compliance with CPAP.  Echocardiogram with EF 40 to 45% and grade 1 diastolic dysfunction.  Patient seen by PT and OT who feel that patient would benefit from CIR.     Review of Systems   Constitutional:  Negative for chills and fever.  Eyes:  Negative for double vision.  Respiratory:  Negative for shortness of breath.   Cardiovascular:  Negative for chest pain.  Gastrointestinal:  Negative for abdominal pain, nausea and vomiting.  Genitourinary: Negative.   Musculoskeletal:  Negative for joint pain.  Skin:  Negative for rash.  Neurological:  Positive for speech change and weakness.        Past Medical History:  Diagnosis Date   Anxiety     Arthritis, rheumatoid ([place])      dx 1988   CAD (coronary artery disease), native coronary artery      Mild LAD disease with calcification noted at cath 12//27/16    Cardiomyopathy ([place])     Cardiomyopathy ([place])     Chronic systolic heart failure ([place]) 05/01/2015   Depression     Deviated nasal septum 06/25/2011   Headache     High cholesterol     Hyperlipidemia     Impingement syndrome of left shoulder 12/22/2017   Macular degeneration     Rheumatoid aortitis     Sleep apnea      does not use cpap - UNABLE TO TOLERATE MASK   Sleep apnea in adult      deviated septum repaired, most recent sleep study was negative   Status post total right knee replacement 06/01/2015         Past Surgical History:  Procedure Laterality Date   ANKLE FUSION Right 05/05/2012    related to his arthritis   ANKLE FUSION Left 05/25/2013    related to  his arthritis   ANKLE SURGERY Bilateral     APPLICATION OF CRANIAL NAVIGATION N/A 04/11/2022    Procedure: APPLICATION OF CRANIAL NAVIGATION;  Surgeon: Consuella Lose, MD;  Location: [facility];  Service: Neurosurgery;  Laterality: N/A;   CARDIAC CATHETERIZATION N/A 05/01/2015    Procedure: Left Heart Cath and Coronary Angiography;  Surgeon: Belva Crome, MD;  Location: [facility];  Service: Cardiovascular;  Laterality: N/A;   CRANIOTOMY N/A 04/11/2022    Procedure: STEREOTACTIC SUBOCCIPITAL CRANIOTOMY FOR RESECTION OF ARTERIO-VENOUS MALFORMATION;  Surgeon: Consuella Lose, MD;   Location: [facility];  Service: Neurosurgery;  Laterality: N/A;   FRACTURE SURGERY        left femur fracture x 3   IR ANGIO EXTERNAL CAROTID SEL EXT CAROTID BILAT MOD SED   01/28/2022   IR ANGIO INTRA EXTRACRAN SEL INTERNAL CAROTID BILAT MOD SED   01/28/2022   IR ANGIO INTRA EXTRACRAN SEL INTERNAL CAROTID BILAT MOD SED   04/16/2022   IR ANGIO VERTEBRAL SEL VERTEBRAL BILAT MOD SED   01/28/2022   IR ANGIO VERTEBRAL SEL VERTEBRAL BILAT MOD SED   04/16/2022   KNEE ARTHROSCOPY        right x 4   LUMBAR LAMINECTOMY/DECOMPRESSION MICRODISCECTOMY N/A 07/18/2022    Procedure: Repair of Pseudomeningiocele posterior;  Surgeon: Consuella Lose, MD;  Location: [facility];  Service: Neurosurgery;  Laterality: N/A;   NASAL SEPTOPLASTY W/ TURBINOPLASTY   06/25/2011    Procedure: NASAL SEPTOPLASTY WITH TURBINATE REDUCTION;  Surgeon: Jerrell Belfast, MD;  Location: [facility];  Service: ENT;  Laterality: Bilateral;   PLACEMENT OF LUMBAR DRAIN N/A 07/18/2022    Procedure: PLACEMENT OF LUMBAR DRAIN;  Surgeon: Consuella Lose, MD;  Location: [facility];  Service: Neurosurgery;  Laterality: N/A;   TONSILLECTOMY       TOTAL KNEE ARTHROPLASTY Right 06/01/2015    Procedure: RIGHT TOTAL KNEE ARTHROPLASTY;  Surgeon: Mcarthur Rossetti, MD;  [facility];  Service: Orthopedics;  Laterality: Right;  Block+general         Family History  Problem Relation Age of Onset   Lupus Sister      Social History:  reports that he quit smoking about 14 years ago. His smoking use included cigarettes. He has a 10.00 pack-year smoking history. He has never used smokeless tobacco. He reports current alcohol use. He reports that he does not use drugs. Allergies: No Known Allergies       Medications Prior to Admission  Medication Sig Dispense Refill   aspirin 81 MG tablet Take 81 mg by mouth daily.       CALCIUM PO Take 1 tablet by mouth daily.       Cholecalciferol (VITAMIN D3) 50 MCG (2000 UT) TABS Take 2,000 Units by mouth daily.        diazepam (VALIUM) 5 MG tablet Take 1 tablet (5 mg total) by mouth every 8 (eight) hours as needed for muscle spasms. 30 tablet 0   fluticasone (FLONASE) 50 MCG/ACT nasal spray Place 1 spray into both nostrils daily as needed for allergies or rhinitis.       gabapentin (NEURONTIN) 300 MG capsule Take 300 mg by mouth 2 (two) times daily.       methotrexate (RHEUMATREX) 2.5 MG tablet Take 20 mg by mouth once a week. Takes 8 tablets(20mg ) every Saturday.     Caution:Chemotherapy. Protect from light.       metoprolol succinate (TOPROL-XL) 25 MG 24 hr tablet TAKE 1 TABLET DAILY 90 tablet  2   Multiple Vitamins-Minerals (PRESERVISION AREDS 2 PO) Take 2 capsules by mouth daily.       predniSONE (DELTASONE) 5 MG tablet Take 20 mg by mouth daily as needed (Arthritis).       rosuvastatin (CRESTOR) 20 MG tablet Take 1 tablet (20 mg total) by mouth daily. 90 tablet 3   sertraline (ZOLOFT) 50 MG tablet Take 50 mg by mouth every morning.       spironolactone (ALDACTONE) 25 MG tablet TAKE 1 TABLET DAILY (Patient taking differently: Take 12.5 mg by mouth daily.) 90 tablet 2   sulfaSALAzine (AZULFIDINE) 500 MG tablet Take 1,000 mg by mouth 2 (two) times daily.        traMADol (ULTRAM) 50 MG tablet Take 50 mg by mouth every 6 (six) hours as needed for moderate pain or severe pain.       clindamycin (CLEOCIN) 150 MG capsule Take 600 mg by mouth See admin instructions. Take 600mg  by mouth an hour prior to dental appointment.       HYDROcodone-acetaminophen (NORCO/VICODIN) 5-325 MG tablet Take 1 tablet by mouth every 4 (four) hours as needed for up to 7 days for moderate pain. (Patient not taking: Reported on 07/30/2022) 30 tablet 0   lisinopril (ZESTRIL) 10 MG tablet TAKE 1 TABLET DAILY (Patient not taking: Reported on 05/09/2022) 90 tablet 3   Propylene Glycol (SYSTANE COMPLETE) 0.6 % SOLN Place 1 drop into both eyes 2 (two) times daily.       RITUXAN 500 MG/50ML injection See admin instructions. Every 6 months 2 dose  infusion Ruxience (Patient not taking: Reported on 04/22/2022)          Home: [place] expects to be discharged to:: Private residence Living Arrangements: Spouse/significant other, Children Available Help at Discharge: Available PRN/intermittently (spouse works 7-12:30 typically) Type of Home: House Home Access: Stairs to enter Technical brewer of Steps: 3 Entrance Stairs-Rails: Right [place]: Two level, Able to live on main level with bedroom/bathroom Alternate Level Stairs-Number of Steps: flight Bathroom Shower/Tub: Multimedia programmer: Handicapped height Bathroom Accessibility: Yes Home Equipment: Conservation officer, nature (2 wheels), [place] - single point, Grab bars - toilet  Functional History: Prior Function Prior Level of Function : Needs assist Mobility Comments: independent with mobility prior to initial surgery in december. Has been ambulating with RW since surgery, multiple falls. Was ambulating with RW and assistance from spouse at all times since recent discharge on 3/22. ADLs Comments: assistance with all ADLs since discharge last week. Independent prior to initial surgery in December Functional Status:  Mobility: Bed Mobility Overal bed mobility: Needs Assistance Bed Mobility: Supine to Sit, Sit to Supine Supine to sit: Supervision Sit to supine: Supervision General bed mobility comments: No physical assist required Transfers Overall transfer level: Needs assistance Equipment used: Rolling walker (2 wheels) Transfers: Sit to/from Stand Sit to Stand: Min assist General transfer comment: Increased time to rise, light minA to power up to walker Ambulation/Gait Ambulation/Gait assistance: Min assist, Mod assist Gait Distance (Feet): 100 Feet Assistive device: Rolling walker (2 wheels) Gait Pattern/deviations: Decreased step length - left, Ataxic, Trunk flexed, Step-to pattern General Gait Details: Verbal cues for increased  L step length, upright posture, environmental/obstacle negotiation. Pt requiring min assist overall for balance, regressing to modA with fatigue and increased L lateral lean in standing Gait velocity: reduced Gait velocity interpretation: <1.31 ft/sec, indicative of household ambulator   ADL: ADL Overall ADL's : Needs assistance/impaired Eating/Feeding: Independent, Sitting Grooming: Sitting,  Min guard Upper Body Bathing: Min guard, Sitting Upper Body Bathing Details (indicate cue type and reason): Pt spouse reports sponge bathing recently PTA Lower Body Bathing: Sitting/lateral leans, Moderate assistance Lower Body Bathing Details (indicate cue type and reason): Pt spouse reports sponge bathing recently PTA Upper Body Dressing : Min guard, Sitting Lower Body Dressing: Moderate assistance, Sitting/lateral leans Toilet Transfer: Rolling walker (2 wheels), Ambulation, Moderate assistance Toileting- Clothing Manipulation and Hygiene: Moderate assistance, Sit to/from stand Tub/ Shower Transfer: Moderate assistance, Rolling walker (2 wheels), Ambulation Functional mobility during ADLs: Rolling walker (2 wheels), Min guard General ADL Comments: Lateral stepping at bedside   Cognition: Cognition Overall Cognitive Status: Impaired/Different from baseline Orientation Level: Oriented X4 Cognition Arousal/Alertness: Awake/alert Behavior During Therapy: Flat affect Overall Cognitive Status: Impaired/Different from baseline Area of Impairment: Problem solving, Awareness Awareness: Emergent Problem Solving: Requires verbal cues   Blood pressure 108/87, pulse 94, temperature 98.1 F (36.7 C), temperature source Oral, resp. rate 18, height 6\' 1"  (1.854 m), weight 90.3 kg, SpO2 96 %. Physical Exam   General: NAD, lying in bed HEENT: Head with occipital swelling noted, PERRLA, EOMI, sclera anicteric, oral mucosa pink and moist, dentition intact, ext ear canals clear,  Neck: Supple without JVD or  lymphadenopathy Heart: Reg rate and rhythm. No murmurs rubs or gallops Chest: CTA bilaterally without wheezes, rales, or rhonchi; no distress Abdomen: Soft, non-tender, non-distended, bowel sounds positive. Extremities: No clubbing, cyanosis, or edema. Pulses are 2+ Psych: Pt's affect is appropriate. Pt is cooperative Skin: Clean and intact without signs of breakdown Neuro: Alert and oriented x 4, follows commands, dysarthria present, comprehension overall intact, repetition intact, able to name 3 objects, cranial nerves II through XII intact Strength 5 out of 5 in bilateral upper extremities Strength 4+ out of 5 in proximal lower extremities, 5 out of 5 in distal lower extremities Sensation intact light touch in all 4 extremities No pronator drift FTN altered L>R HTS b/l slow to complete Musculoskeletal: Normal bulk and tone, no joint swelling noted Healed right TKA incision   Lab Results Last 24 Hours       Results for orders placed or performed during the hospital encounter of 07/28/22 (from the past 24 hour(s))  Basic metabolic panel     Status: Abnormal    Collection Time: 07/30/22  4:01 AM  Result Value Ref Range    Sodium 139 135 - 145 mmol/L    Potassium 3.7 3.5 - 5.1 mmol/L    Chloride 99 98 - 111 mmol/L    CO2 28 22 - 32 mmol/L    Glucose, Bld 120 (H) 70 - 99 mg/dL    BUN 9 6 - 20 mg/dL    Creatinine, Ser 0.78 0.61 - 1.24 mg/dL    Calcium 9.2 8.9 - 10.3 mg/dL    GFR, Estimated >60 >60 mL/min    Anion gap 12 5 - 15  CBC     Status: Abnormal    Collection Time: 07/30/22  4:01 AM  Result Value Ref Range    WBC 7.7 4.0 - 10.5 K/uL    RBC 4.66 4.22 - 5.81 MIL/uL    Hemoglobin 10.7 (L) 13.0 - 17.0 g/dL    HCT 34.7 (L) 39.0 - 52.0 %    MCV 74.5 (L) 80.0 - 100.0 fL    MCH 23.0 (L) 26.0 - 34.0 pg    MCHC 30.8 30.0 - 36.0 g/dL    RDW 18.6 (H) 11.5 - 15.5 %    Platelets  231 150 - 400 K/uL    nRBC 0.0 0.0 - 0.2 %  Magnesium     Status: None    Collection Time: 07/30/22   4:01 AM  Result Value Ref Range    Magnesium 1.9 1.7 - 2.4 mg/dL       Imaging Results (Last 48 hours)  DG Swallowing Func-Speech Pathology   Result Date: 07/30/2022 Table formatting from the original result was not included. Modified Barium Swallow Study Patient Details Name: Hosey Harps MRN: [id number] Date of Birth: 1961-03-30 Today's Date: 07/30/2022 HPI/PMH: HPI: 62 y.o. male presents to [facility] hospital on 07/28/2022 with slurred speech, worsening gait and HA. Pt recently discharged 07/25/2022 after operative repair of pseudomeningiocele. MRI brain 3/25 demonstrates acute R cerebellar infarct. PMH: for CAD, R TKA, cardiomyopathy, chronic systolic heart failure, macular degeneration. Clinical Impression: Clinical Impression: Pt demonstrated minimal oral dysphagia with trace lingual residue present intermittently and mild pharyngeal dysphagia. Pt exhibited decreased laryngeal elevation with thin barium entering airway prior to full laryngeal closure resulting in penetration remaining on the anterior portion of vestibule. A chin tuck posture was not effective and he continued to penetrate with spontaeous cough that expelled penetrates. A head turn to left with thin resulted in sensed aspiration with weak cough. Nectar thick was also penetrated during and after the swallow and chin tuck was ineffective however a head turn to the left was 90% effective to prevent penetration over multiple trials. Reduced tonuge base retraction led to minimal vallecular residue consistently. Pt was able to masticate solid texture with flash penetration. Given deconditioning recommend he start a more conservative diet texture of Dys 2 (minced), nectar thick with head turn to the left with liquids, intermittent cough/throat clear, no straws, small sips and initiate meds crushed in puree (can advance to whole in puree as able). Factors that may increase risk of adverse event in presence of aspiration ([place]  2021): No data recorded Recommendations/Plan: Swallowing Evaluation Recommendations Swallowing Evaluation Recommendations Recommendations: PO diet PO Diet Recommendation: Dysphagia 2 (Finely chopped); Mildly thick liquids (Level 2, nectar thick) Liquid Administration via: Cup; No straw Medication Administration: Crushed with puree Supervision: Staff to assist with self-feeding; Full supervision/cueing for swallowing strategies Swallowing strategies  : Head turn left during swallowing (with liquids) Postural changes: Position pt fully upright for meals Oral care recommendations: Oral care BID (2x/day) Treatment Plan Treatment Plan Treatment recommendations: Therapy as outlined in treatment plan below Follow-up recommendations: Acute inpatient rehab (3 hours/day) Functional status assessment: Patient has had a recent decline in their functional status and demonstrates the ability to make significant improvements in function in a reasonable and predictable amount of time. Treatment frequency: Min 2x/week Treatment duration: 2 weeks Interventions: Aspiration precaution training; Patient/family education; Compensatory techniques; Trials of upgraded texture/liquids Recommendations Recommendations for follow up therapy are one component of a multi-disciplinary discharge planning process, led by the attending physician.  Recommendations may be updated based on patient status, additional functional criteria and insurance authorization. Assessment: Orofacial Exam: Orofacial Exam Oral Cavity: Oral Hygiene: WFL Oral Cavity - Dentition: Adequate natural dentition Orofacial Anatomy: Other (comment) Oral Motor/Sensory Function: Suspected cranial nerve impairment CN V - Trigeminal: WFL CN VII - Facial: Left motor impairment CN IX - Glossopharyngeal, CN X - Vagus: Not tested Anatomy: Anatomy: WFL Boluses Administered: Boluses Administered Boluses Administered: Thin liquids (Level 0); Mildly thick liquids (Level 2, nectar thick);  Moderately thick liquids (Level 3, honey thick); Puree; Solid  Oral Impairment Domain: Oral Impairment Domain Lip  Closure: No labial escape Tongue control during bolus hold: Cohesive bolus between tongue to palatal seal Bolus preparation/mastication: Timely and efficient chewing and mashing Bolus transport/lingual motion: Brisk tongue motion Oral residue: Trace residue lining oral structures Location of oral residue : Tongue Initiation of pharyngeal swallow : Pyriform sinuses  Pharyngeal Impairment Domain: Pharyngeal Impairment Domain Soft palate elevation: No bolus between soft palate (SP)/pharyngeal wall (PW) Laryngeal elevation: Partial superior movement of thyroid cartilage/partial approximation of arytenoids to epiglottic petiole Anterior hyoid excursion: Partial anterior movement Epiglottic movement: Complete inversion Laryngeal vestibule closure: Incomplete, narrow column air/contrast in laryngeal vestibule Pharyngeal stripping wave : Present - complete Pharyngeal contraction (A/P view only): N/A Pharyngoesophageal segment opening: Complete distension and complete duration, no obstruction of flow Tongue base retraction: Trace column of contrast or air between tongue base and PPW Pharyngeal residue: Collection of residue within or on pharyngeal structures Location of pharyngeal residue: Valleculae  Esophageal Impairment Domain: Esophageal Impairment Domain Esophageal clearance upright position: Complete clearance, esophageal coating Pill: Esophageal Impairment Domain Esophageal clearance upright position: Complete clearance, esophageal coating Penetration/Aspiration Scale Score: Penetration/Aspiration Scale Score 2.  Material enters airway, remains ABOVE vocal cords then ejected out: Thin liquids (Level 0) 3.  Material enters airway, remains ABOVE vocal cords and not ejected out: Thin liquids (Level 0) 4.  Material enters airway, CONTACTS cords then ejected out: Mildly thick liquids (Level 2, nectar thick)  Compensatory Strategies: Compensatory Strategies Compensatory strategies: Yes Chin tuck: Ineffective Ineffective Chin Tuck: Thin liquid (Level 0); Mildly thick liquid (Level 2, nectar thick) Left head turn: Effective (ineffective with thin, effective with nectar) Effective Left Head Turn: Mildly thick liquid (Level 2, nectar thick) Ineffective Left Head Turn: Thin liquid (Level 0)   General Information: Caregiver present: No  Diet Prior to this Study: NPO   Temperature : Normal   Respiratory Status: WFL   Supplemental O2: None (Room air)   History of Recent Intubation: No  Behavior/Cognition: Alert; Cooperative; Pleasant mood Self-Feeding Abilities: Able to self-feed Baseline vocal quality/speech: Hypophonia/low volume Volitional Cough: Able to elicit Volitional Swallow: Able to elicit No data recorded Goal Planning: Prognosis for improved oropharyngeal function: Good No data recorded No data recorded No data recorded Consulted and agree with results and recommendations: Patient; Family member/caregiver Pain: Pain Assessment Pain Assessment: No/denies pain Pain Score: 5 Faces Pain Scale: 4 Pain Location: Headache Pain Descriptors / Indicators: Grimacing Pain Intervention(s): Monitored during session End of Session: Start Time:SLP Start Time (ACUTE ONLY): 1038 Stop Time: SLP Stop Time (ACUTE ONLY): 1051 Time Calculation:SLP Time Calculation (min) (ACUTE ONLY): 13 min Charges: SLP Evaluations $ SLP Speech Visit: 1 Visit SLP Evaluations $BSS Swallow: 1 Procedure $MBS Swallow: 1 Procedure SLP visit diagnosis: SLP Visit Diagnosis: Dysphagia, oropharyngeal phase (R13.12) Past Medical History: Past Medical History: Diagnosis Date  Anxiety   Arthritis, rheumatoid ([place])   dx 1988  CAD (coronary artery disease), native coronary artery   Mild LAD disease with calcification noted at cath 12//27/16   Cardiomyopathy ([place])   Cardiomyopathy ([place])   Chronic systolic heart failure ([place]) 05/01/2015  Depression   Deviated nasal septum  06/25/2011  Headache   High cholesterol   Hyperlipidemia   Impingement syndrome of left shoulder 12/22/2017  Macular degeneration   Rheumatoid aortitis   Sleep apnea   does not use cpap - UNABLE TO TOLERATE MASK  Sleep apnea in adult   deviated septum repaired, most recent sleep study was negative  Status post total right knee replacement 06/01/2015 Past Surgical History: Past  Surgical History: Procedure Laterality Date  ANKLE FUSION Right 05/05/2012  related to his arthritis  ANKLE FUSION Left 05/25/2013  related to his arthritis  ANKLE SURGERY Bilateral   APPLICATION OF CRANIAL NAVIGATION N/A [id number]  Procedure: APPLICATION OF CRANIAL NAVIGATION;  Surgeon: Consuella Lose, MD;  Location: [facility];  Service: Neurosurgery;  Laterality: N/A;  CARDIAC CATHETERIZATION N/A 05/01/2015  Procedure: Left Heart Cath and Coronary Angiography;  Surgeon: Belva Crome, MD;  Location: [facility];  Service: Cardiovascular;  Laterality: N/A;  CRANIOTOMY N/A 04/11/2022  Procedure: STEREOTACTIC SUBOCCIPITAL CRANIOTOMY FOR RESECTION OF ARTERIO-VENOUS MALFORMATION;  Surgeon: Consuella Lose, MD;  Location: [facility];  Service: Neurosurgery;  Laterality: N/A;  FRACTURE SURGERY    left femur fracture x 3  IR ANGIO EXTERNAL CAROTID SEL EXT CAROTID BILAT MOD SED  01/28/2022  IR ANGIO INTRA EXTRACRAN SEL INTERNAL CAROTID BILAT MOD SED  01/28/2022  IR ANGIO INTRA EXTRACRAN SEL INTERNAL CAROTID BILAT MOD SED  04/16/2022  IR ANGIO VERTEBRAL SEL VERTEBRAL BILAT MOD SED  01/28/2022  IR ANGIO VERTEBRAL SEL VERTEBRAL BILAT MOD SED  04/16/2022  KNEE ARTHROSCOPY    right x 4  LUMBAR LAMINECTOMY/DECOMPRESSION MICRODISCECTOMY N/A 07/18/2022  Procedure: Repair of Pseudomeningiocele posterior;  Surgeon: Consuella Lose, MD;  Location: [facility];  Service: Neurosurgery;  Laterality: N/A;  NASAL SEPTOPLASTY W/ TURBINOPLASTY  06/25/2011  Procedure: NASAL SEPTOPLASTY WITH TURBINATE REDUCTION;  Surgeon: Jerrell Belfast, MD;  Location: [facility];   Service: ENT;  Laterality: Bilateral;  PLACEMENT OF LUMBAR DRAIN N/A 07/18/2022  Procedure: PLACEMENT OF LUMBAR DRAIN;  Surgeon: Consuella Lose, MD;  Location: [facility];  Service: Neurosurgery;  Laterality: N/A;  TONSILLECTOMY    TOTAL KNEE ARTHROPLASTY Right 06/01/2015  Procedure: RIGHT TOTAL KNEE ARTHROPLASTY;  Surgeon: Mcarthur Rossetti, MD;  Location: [facility];  Service: Orthopedics;  Laterality: Right;  Block+general [place] Siren 07/30/2022, 12:27 PM   ECHOCARDIOGRAM COMPLETE   Result Date: 07/29/2022    ECHOCARDIOGRAM REPORT   Patient Name:   KELLEE KNEECE Date of Exam: 07/29/2022 Medical Rec #:  [id number]                   Height:       73.0 in Accession #:    [id number]                  Weight:       199.0 lb Date of Birth:  26-Oct-1960                   BSA:          2.147 m Patient Age:    62 years                    BP:           124/103 mmHg Patient Gender: M                           HR:           93 bpm. Exam Location:  Inpatient Procedure: 2D Echo, Cardiac Doppler and Color Doppler Indications:    Stroke I63.9  History:        Patient has prior history of Echocardiogram examinations, most                 recent 06/22/2017. Cardiomyopathy and CHF, CAD, Stroke; Risk  Factors:Sleep Apnea and Dyslipidemia.  Sonographer:    Ronny Flurry Referring Phys: [id number] TIMOTHY S OPYD  Sonographer Comments: No subcostal window. IMPRESSIONS  1. Left ventricular ejection fraction, by estimation, is 40 to 45%. The left ventricle has mildly decreased function. The left ventricle demonstrates global hypokinesis. Left ventricular diastolic parameters are consistent with Grade I diastolic dysfunction (impaired relaxation).  2. Right ventricular systolic function is normal. The right ventricular size is normal.  3. The mitral valve is normal in structure. No evidence of mitral valve regurgitation.  4. Suspect bicuspid aortic valve. There is mild low flow-low gradient aortic  stenosis (gradients are underestimated due to low stroke volume). The aortic valve is bicuspid. There is mild calcification of the aortic valve. There is moderate thickening of  the aortic valve. Aortic valve regurgitation is not visualized. Mild aortic valve stenosis. Aortic valve mean gradient measures 7.0 mmHg. Aortic valve Vmax measures 1.78 m/s.  5. Aortic dilatation noted. There is moderate dilatation of the aortic root, measuring 45 mm. Comparison(s): Prior images unable to be directly viewed, comparison made by report only. The aortic valve is identified as bicuspid, otherwise no change. FINDINGS  Left Ventricle: Left ventricular ejection fraction, by estimation, is 40 to 45%. The left ventricle has mildly decreased function. The left ventricle demonstrates global hypokinesis. The left ventricular internal cavity size was normal in size. There is  borderline concentric left ventricular hypertrophy. Left ventricular diastolic parameters are consistent with Grade I diastolic dysfunction (impaired relaxation). Normal left ventricular filling pressure. Right Ventricle: The right ventricular size is normal. No increase in right ventricular wall thickness. Right ventricular systolic function is normal. Left Atrium: Left atrial size was normal in size. Right Atrium: Right atrial size was normal in size. Pericardium: There is no evidence of pericardial effusion. Mitral Valve: The mitral valve is normal in structure. No evidence of mitral valve regurgitation. Tricuspid Valve: The tricuspid valve is normal in structure. Tricuspid valve regurgitation is not demonstrated. Aortic Valve: Suspect bicuspid aortic valve. There is mild low flow-low gradient aortic stenosis (gradients are underestimated due to low stroke volume). The aortic valve is bicuspid. There is mild calcification of the aortic valve. There is moderate thickening of the aortic valve. Aortic valve regurgitation is not visualized. Mild aortic stenosis is  present. Aortic valve mean gradient measures 7.0 mmHg. Aortic valve peak gradient measures 12.7 mmHg. Aortic valve area, by VTI measures 1.97 cm. Pulmonic Valve: The pulmonic valve was grossly normal. Pulmonic valve regurgitation is not visualized. Aorta: Aortic dilatation noted. There is moderate dilatation of the aortic root, measuring 45 mm. IAS/Shunts: No atrial level shunt detected by color flow Doppler.  LEFT VENTRICLE PLAX 2D LVIDd:         4.60 cm LVIDs:         3.20 cm LV PW:         1.10 cm LV IVS:        1.20 cm LVOT diam:     2.60 cm LV SV:         60 LV SV Index:   28 LVOT Area:     5.31 cm  RIGHT VENTRICLE RV S prime:     12.90 cm/s LEFT ATRIUM             Index        RIGHT ATRIUM           Index LA diam:        3.60 cm 1.68 cm/m   RA  Area:     15.70 cm LA Vol (A2C):   39.7 ml 18.49 ml/m  RA Volume:   31.45 ml  14.65 ml/m LA Vol (A4C):   31.0 ml 14.44 ml/m LA Biplane Vol: 36.5 ml 17.00 ml/m  AORTIC VALVE AV Area (Vmax):    2.01 cm AV Area (Vmean):   1.93 cm AV Area (VTI):     1.97 cm AV Vmax:           178.50 cm/s AV Vmean:          121.500 cm/s AV VTI:            0.302 m AV Peak Grad:      12.7 mmHg AV Mean Grad:      7.0 mmHg LVOT Vmax:         67.42 cm/s LVOT Vmean:        44.100 cm/s LVOT VTI:          0.112 m LVOT/AV VTI ratio: 0.37  AORTA Ao Root diam: 4.55 cm Ao Asc diam:  4.20 cm MITRAL VALVE MV Area (PHT): 3.77 cm    SHUNTS MV Decel Time: 201 msec    Systemic VTI:  0.11 m MV E velocity: 38.00 cm/s  Systemic Diam: 2.60 cm MV A velocity: 75.10 cm/s MV E/A ratio:  0.51 Mihai Croitoru MD Electronically signed by Sanda Klein MD Signature Date/Time: 07/29/2022/4:13:22 PM    Final     CT ANGIO HEAD NECK W WO CM   Result Date: 07/29/2022 CLINICAL DATA:  Stroke, determine embolic source EXAM: CT ANGIOGRAPHY HEAD AND NECK TECHNIQUE: Multidetector CT imaging of the head and neck was performed using the standard protocol during bolus administration of intravenous contrast. Multiplanar  CT image reconstructions and MIPs were obtained to evaluate the vascular anatomy. Carotid stenosis measurements (when applicable) are obtained utilizing NASCET criteria, using the distal internal carotid diameter as the denominator. RADIATION DOSE REDUCTION: This exam was performed according to the departmental dose-optimization program which includes automated exposure control, adjustment of the mA and/or kV according to patient size and/or use of iterative reconstruction technique. CONTRAST:  43mL OMNIPAQUE IOHEXOL 350 MG/ML SOLN COMPARISON:  Brain MRI from yesterday FINDINGS: CT HEAD FINDINGS Brain: Pseudomeningocele with recent repair and posterior fossa gas along the upper cerebellum. Infarct by MRIs difficult to separate from chronic low-density in the right more than left cerebral hemisphere. Unchanged collection with mass effect asymmetrically to the left cerebral hemisphere. No hydrocephalus or acute hemorrhage. Vascular: See below Skull: Remote suboccipital craniectomy. Sinuses/Orbits: Negative Review of the MIP images confirms the above findings CTA NECK FINDINGS Aortic arch: Atheromatous plaque with 3 vessel branching. Right carotid system: Mild atheromatous plaque at the bifurcation. No ulceration or significant stenosis Left carotid system: Mild atheromatous plaque at the bifurcation. No ulceration or significant stenosis. Vertebral arteries: No proximal subclavian stenosis. Mild atheromatous plaque at the right vertebral origin. No beading or ulceration. Extrinsic narrowing of the left vertebral artery at C5-6 due to facet spurring from behind, narrowing measuring 60% on sagittal reformats. Skeleton: Ordinary cervical spine degeneration. Other neck: No acute finding. Known dissecting fluid collection spanning from the suboccipital craniectomy inferiorly into the neck. Upper chest: Clear apical lungs. Review of the MIP images confirms the above findings CTA HEAD FINDINGS Anterior circulation: Mild  calcified plaque along the carotid siphons. No stenosis, branch occlusion, beading, or aneurysm Posterior circulation: The vertebral and basilar arteries are smoothly contoured and widely patent. Symmetrically enhancing superior cerebellar arteries without visible occlusion  or irregularity. Symmetric and smoothly contoured posterior cerebral arteries. Venous sinuses: Unremarkable for arterial timing. Anatomic variants: None significant Review of the MIP images confirms the above findings IMPRESSION: 1. No vascular lesion seen underlying the cerebellar infarct. 2. Chronic 60% narrowing of the left vertebral artery due to extrinsic compression by C5-6 left facet osteophyte. 3. Limited atheromatous change for age. Electronically Signed   By: Jorje Guild M.D.   On: 07/29/2022 05:06    MR Brain Wo Contrast (neuro protocol)   Result Date: 07/28/2022 CLINICAL DATA:  Neuro deficit, acute, stroke suspected. EXAM: MRI HEAD WITHOUT CONTRAST TECHNIQUE: Multiplanar, multiecho pulse sequences of the brain and surrounding structures were obtained without intravenous contrast. COMPARISON:  Head CT 07/28/2022.  MRI brain 07/05/2022. FINDINGS: Brain: New small focus of acute infarction in the right superior cerebellar hemisphere with surrounding area of increased T2 signal and cystic change, which is new since the prior MRI dated 07/05/2022 and suspicious for now chronic infarct. Postoperative changes of prior left posterior fossa resection. Redemonstrated foci of gas within the resection cavity, fourth ventricle and extra-axial spaces, likely secondary to recent pseudomeningocele repair and/or lumbar drain placement/removal. No acute intracranial hemorrhage.  No hydrocephalus. Vascular: Normal flow voids. Skull and upper cervical spine: Suboccipital craniectomy. Sinuses/Orbits: Trace fluid in the right sphenoid sinus. Other: Decreased size of the fluid collection in the dorsal soft tissues overlying the suboccipital  craniectomy. IMPRESSION: 1. New small focus of acute infarction in the right superior cerebellar hemisphere with surrounding area of increased T2 signal and cystic change, which is new since the prior MRI dated 07/05/2022 and suspicious for now chronic infarct. 2. Postoperative changes of prior left posterior fossa resection. Redemonstrated foci of gas within the resection cavity, fourth ventricle and extra-axial spaces, likely secondary to recent pseudomeningocele repair and/or lumbar drain placement/removal. 3. Decreased size of the fluid collection in the dorsal soft tissues overlying the suboccipital craniectomy. Electronically Signed   By: Emmit Alexanders M.D.   On: 07/28/2022 20:06    CT Head Wo Contrast   Result Date: 07/28/2022 CLINICAL DATA:  Headache, neuro deficit. EXAM: CT HEAD WITHOUT CONTRAST TECHNIQUE: Contiguous axial images were obtained from the base of the skull through the vertex without intravenous contrast. RADIATION DOSE REDUCTION: This exam was performed according to the departmental dose-optimization program which includes automated exposure control, adjustment of the mA and/or kV according to patient size and/or use of iterative reconstruction technique. COMPARISON:  Head CT 04/29/2022.  MRI brain 07/05/2022. 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 New pneumocephalus in the resection cavity, likely secondary to recent pseudomeningocele repair and lumbar drain placement/removal. No acute intracranial hemorrhage, mass effect or midline shift. No hydrocephalus. Vascular: No hyperdense vessel or unexpected calcification. Skull: Suboccipital craniectomy. Sinuses/Orbits: Trace fluid in the sphenoid sinus. Orbits are unremarkable. Other: Partially imaged fluid collection in the dorsal soft tissues overlying the suboccipital craniectomy, likely residual or recurrent pseudomeningocele.  IMPRESSION: 1. 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 New pneumocephalus in the resection cavity, likely secondary to recent pseudomeningocele repair and lumbar drain placement/removal. No acute intracranial hemorrhage. 2. Partially imaged fluid collection in the dorsal soft tissues overlying the suboccipital craniectomy, likely residual or recurrent pseudomeningocele. Electronically Signed   By: Emmit Alexanders M.D.   On: 07/28/2022 17:15       Assessment/Plan: Diagnosis: Pseudomeningocele repair with worsening ataxia, dysarthria, dysphagia Does the need for close,  24 hr/day medical supervision in concert with the patient's rehab needs make it unreasonable for this patient to be served in a less intensive setting? Yes Co-Morbidities requiring supervision/potential complications:  -Prediabetes -OSA -Systolic/diastolic heart failure -Rheumatoid arthritis -Depression -Hyperlipidemia Due to bladder management, bowel management, safety, skin/wound care, disease management, medication administration, pain management, and patient education, does the patient require 24 hr/day rehab nursing? Yes Does the patient require coordinated care of a physician, rehab nurse, therapy disciplines of PT/OT/SLP  to address physical and functional deficits in the context of the above medical diagnosis(es)? Yes Addressing deficits in the following areas: balance, endurance, locomotion, strength, transferring, bowel/bladder control, bathing, dressing, feeding, grooming, toileting, swallowing, and psychosocial support Can the patient actively participate in an intensive therapy program of at least 3 hrs of therapy per day at least 5 days per week? Yes The potential for patient to make measurable gains while on inpatient rehab is excellent Anticipated functional outcomes upon discharge from inpatient rehab are modified independent   with PT, modified independent with OT, modified independent with SLP. Estimated rehab length of stay to reach the above functional goals is: 7-10 Anticipated discharge destination: Home Overall Rehab/Functional Prognosis: excellent   POST ACUTE RECOMMENDATIONS: This patient's condition is appropriate for continued rehabilitative care in the following setting: CIR Patient has agreed to participate in recommended program. Yes Note that insurance prior authorization may be required for reimbursement for recommended care.   Comment: Patient with functional deficits related to ataxia, dysarthria and dysphagia.  He would be a good candidate for treatment with inpatient therapy at CIR.   I have personally performed a face to face diagnostic evaluation of this patient. Additionally, I have examined the patient's medical record including any pertinent labs and radiographic images. If the physician assistant has documented in this note, I have reviewed and edited or otherwise concur with the physician assistant's documentation.   Thanks,   Jennye Boroughs, MD 07/30/2022             Routing History

## 2022-07-31 NOTE — Progress Notes (Signed)
Speech Language Pathology Treatment: Dysphagia  Patient Details Name: Christopher Yu MRN: [id number] DOB: 09/05/1960 Today's Date: 07/31/2022 Time: [id number] SLP Time Calculation (min) (ACUTE ONLY): 11 min  Assessment / Plan / Recommendation Clinical Impression  Pt's wife stated he had several coughs with breakfast and some reminders for left head turn. Observed with nectar thick liquids and he needed cueing for one of two trials to turn head- no s/s aspiration noted with liquids but he did cough after bite of cut up pancake. Reminded him to refrain from talking immediately after swallows and for double swallows and intermittent throat clears. Reiterated upright position, small sips and no straws. At end of session he was able to recall left head turn. Continue regular texture with pt/wife assisting to order foods that are softer and easily managed, nectar thick with left head turn, crush meds, double swallow and intermittent throat clear. Wife noted mild oral residue after liquids intermittently.    HPI HPI: 62 y.o. male presents to [facility] hospital on 07/28/2022 with slurred speech, worsening gait and HA. Pt recently discharged 07/25/2022 after operative repair of pseudomeningiocele. MRI brain 3/25 demonstrates acute R cerebellar infarct. PMH: for CAD, R TKA, cardiomyopathy, chronic systolic heart failure, macular degeneration.      SLP Plan  Continue with current plan of care      Recommendations for follow up therapy are one component of a multi-disciplinary discharge planning process, led by the attending physician.  Recommendations may be updated based on patient status, additional functional criteria and insurance authorization.    Recommendations  Diet recommendations: Regular;Nectar-thick liquid Liquids provided via: Cup;No straw Medication Administration: Crushed with puree Supervision: Patient able to self feed;Full supervision/cueing for compensatory  strategies Compensations: Slow rate;Small sips/bites;Multiple dry swallows after each bite/sip;Clear throat intermittently Postural Changes and/or Swallow Maneuvers: Seated upright 90 degrees                Rehab consult Oral care BID   Frequent or constant Supervision/Assistance Dysphagia, oropharyngeal phase (R13.12)     Continue with current plan of care     Houston Siren  07/31/2022, 10:26 AM

## 2022-07-31 NOTE — Progress Notes (Signed)
Occupational Therapy Treatment Patient Details Name: Christopher Yu MRN: [id number] DOB: 1960/07/24 Today's Date: 07/31/2022   History of present illness 62 y.o. male presents to [facility] hospital on 07/28/2022 with slurred speech, worsening gait and HA. Pt recently discharged 07/25/2022 after operative repair of pseudomeningiocele. MRI brain 3/25 demonstrates acute R cerebellar infarct. PMH: for CAD, R TKA, cardiomyopathy, chronic systolic heart failure, macular degeneration.   OT comments  Patient demonstrating good gains with OT treatment. Patient able to stand at sink for grooming with min guard to min assist for balance. Patient required mod assist for toilet transfer due to lower surface and assisted with toilet hygiene but required mod assist to complete. Patient is motivated towards progress and would benefit from intensive therapy to increase independence with self care and functional transfers. Acute OT to continue to follow.    Recommendations for follow up therapy are one component of a multi-disciplinary discharge planning process, led by the attending physician.  Recommendations may be updated based on patient status, additional functional criteria and insurance authorization.    Assistance Recommended at Discharge Frequent or constant Supervision/Assistance  Patient can return home with the following  Assistance with cooking/housework;Help with stairs or ramp for entrance;Assist for transportation;A lot of help with bathing/dressing/bathroom;A little help with walking and/or transfers   Equipment Recommendations  None recommended by OT    Recommendations for Other Services      Precautions / Restrictions Precautions Precautions: Fall Restrictions Weight Bearing Restrictions: No       Mobility Bed Mobility Overal bed mobility: Needs Assistance Bed Mobility: Supine to Sit     Supine to sit: Supervision     General bed mobility comments: increased time and  supervision    Transfers Overall transfer level: Needs assistance Equipment used: Rolling walker (2 wheels) Transfers: Sit to/from Stand Sit to Stand: Min assist           General transfer comment: able to ambulate to bathroom and required mod assist from commode to stand     Balance Overall balance assessment: Needs assistance Sitting-balance support: Feet supported Sitting balance-Leahy Scale: Fair     Standing balance support: Single extremity supported, Bilateral upper extremity supported, No upper extremity supported Standing balance-Leahy Scale: Poor Standing balance comment: min guard to min assist standing at sink                           ADL either performed or assessed with clinical judgement   ADL Overall ADL's : Needs assistance/impaired     Grooming: Wash/dry hands;Wash/dry face;Oral care;Brushing hair;Minimal assistance;Standing Grooming Details (indicate cue type and reason): stood at sink with min guard to min assist for balance                 Toilet Transfer: Rolling walker (2 wheels);Ambulation;Moderate assistance Toilet Transfer Details (indicate cue type and reason): ambulated to bathroom with cues for rail use and assistance to lower to commode and to stand from commode Toileting- Clothing Manipulation and Hygiene: Moderate assistance;Sit to/from stand Toileting - Clothing Manipulation Details (indicate cue type and reason): Patient performed toilet hygiene seated with supervision and stood with mod assist to complete       General ADL Comments: frequent cues for safety    Extremity/Trunk Assessment              Vision       Perception     Praxis      Cognition Arousal/Alertness:  Awake/alert Behavior During Therapy: Flat affect Overall Cognitive Status: Impaired/Different from baseline Area of Impairment: Problem solving, Awareness                           Awareness: Emergent Problem Solving:  Requires verbal cues General Comments: cues for safety with walker use and tranfers        Exercises      Shoulder Instructions       General Comments      Pertinent Vitals/ Pain       Pain Assessment Pain Assessment: No/denies pain Pain Intervention(s): Monitored during session  Home Living     Available Help at Discharge: Available 24 hours/day (I asked wife to take FMLA for 24/7 supervision after CIR) Type of Home: House                              Lives With: Spouse    Prior Functioning/Environment              Frequency  Min 2X/week        Progress Toward Goals  OT Goals(current goals can now be found in the care plan section)  Progress towards OT goals: Progressing toward goals  Acute Rehab OT Goals Patient Stated Goal: go to rehab OT Goal Formulation: With patient/family Time For Goal Achievement: 08/11/22 Potential to Achieve Goals: Good ADL Goals Pt Will Perform Grooming: standing;with min guard assist Pt Will Perform Lower Body Bathing: with min guard assist;sitting/lateral leans Pt Will Perform Lower Body Dressing: with modified independence;sit to/from stand Pt Will Transfer to Toilet: with min guard assist;ambulating Pt Will Perform Toileting - Clothing Manipulation and hygiene: with modified independence;sit to/from stand Pt Will Perform Tub/Shower Transfer: Shower transfer;shower seat;rolling walker Pt/caregiver will Perform Home Exercise Program: Increased ROM;With written HEP provided;Independently  Plan Discharge plan remains appropriate    Co-evaluation                 AM-PAC OT "6 Clicks" Daily Activity     Outcome Measure   Help from another person eating meals?: None Help from another person taking care of personal grooming?: A Little Help from another person toileting, which includes using toliet, bedpan, or urinal?: A Lot Help from another person bathing (including washing, rinsing, drying)?: A Lot Help  from another person to put on and taking off regular upper body clothing?: A Little Help from another person to put on and taking off regular lower body clothing?: A Lot 6 Click Score: 16    End of Session Equipment Utilized During Treatment: Gait belt;Rolling walker (2 wheels)  OT Visit Diagnosis: Unsteadiness on feet (R26.81);Other abnormalities of gait and mobility (R26.89);Other symptoms and signs involving the nervous system (R29.898)   Activity Tolerance Patient tolerated treatment well   Patient Left in chair;with call bell/phone within reach;with chair alarm set;with family/visitor present   Nurse Communication Mobility status        Time: 1033-1100 OT Time Calculation (min): 27 min  Charges: OT General Charges $OT Visit: 1 Visit OT Treatments $Self Care/Home Management : 23-37 mins  Lodema Hong, [place]  Office [phone]   Trixie Dredge 07/31/2022, 12:37 PM

## 2022-07-31 NOTE — Progress Notes (Signed)
Inpatient Rehabilitation Admissions Coordinator  I have insurance approval and CIR bed to admit him to today. I met with patient, wife and 3 daughters at bedside and they are in agreement. I will alert acute team and TOC and make the arrangements to admit today.  Danne Baxter, RN, MSN Rehab Admissions Coordinator [phone] 07/31/2022 10:43 AM

## 2022-07-31 NOTE — H&P (Signed)
Physical Medicine and Rehabilitation Admission H&P    Chief Complaint  Patient presents with   Aphasia   Weakness   : HPI: Christopher Yu is a 62 year old right-handed male with history significant for rheumatoid arthritis maintained on methotrexate, CAD maintained on low-dose aspirin, OSA with CPAP intolerance, quit smoking 14 years ago, chronic systolic congestive heart failure, 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 Kathyrn Sheriff and discharged home 07/25/2022.  Per chart review lives with spouse and family.  Spouse works 7-12 30 typically.  Two-level home bed and bath main level 3 steps to entry.  Patient was ambulated with a rolling walker and assistance from spouse since recent discharge 3/22.  Patient presented 07/28/2022 with progressive slurred speech gait abnormality and headache.  CT/MRI showed new small focus of acute infarction in the right superior cerebellar hemisphere with surrounding area of increased T2 signal and cystic change new since prior comparison 07/05/2022.  CT angiogram head and neck no vascular lesions seen underlying the cerebellar infarct.  Echocardiogram with ejection fraction of 40 to 45% left ventricle demonstrating global hypokinesis with grade 1 diastolic dysfunction.  Neurology Dr. Leonie Man as well as neurosurgery follow-up Dr. Duffy Rhody follow-up currently maintained on low-dose aspirin.  No current plan for neurosurgical intervention.  Lovenox added for DVT prophylaxis.  Neurology as well as neurosurgery felt that small DWI hyperintensity identified on imaging not felt to be typical of stroke and more likely to be postoperative change.  He was started on steroids with dexamethasone taper.  His home rheumatologic medications currently on hold with plan to consider restarting after dexamethasone taper is complete.  He is currently on a regular diet with nectar thick liquid diet.  Therapy evaluations completed due  to patient's decreased functional ability gait abnormality was admitted for a comprehensive rehab program.  Review of Systems  Constitutional:  Negative for chills, fever and malaise/fatigue.  HENT:  Negative for congestion and hearing loss.   Eyes:  Negative for blurred vision and double vision.  Respiratory:  Negative for cough, shortness of breath and wheezing.   Cardiovascular:  Negative for chest pain, palpitations and leg swelling.  Gastrointestinal:  Negative for abdominal pain, constipation, heartburn, nausea and vomiting.  Genitourinary:  Negative for dysuria, flank pain and hematuria.  Musculoskeletal:  Positive for joint pain and myalgias.  Skin:  Negative for rash.  Neurological:  Positive for speech change, weakness and headaches. Negative for sensory change.  Psychiatric/Behavioral:  Positive for depression.        Anxiety  All other systems reviewed and are negative.  Past Medical History:  Diagnosis Date   Anxiety    Arthritis, rheumatoid ([place])    dx 1988   CAD (coronary artery disease), native coronary artery    Mild LAD disease with calcification noted at cath 12//27/16    Cardiomyopathy ([place])    Cardiomyopathy ([place])    Chronic systolic heart failure ([place]) 05/01/2015   Depression    Deviated nasal septum 06/25/2011   Headache    High cholesterol    Hyperlipidemia    Impingement syndrome of left shoulder 12/22/2017   Macular degeneration    Rheumatoid aortitis    Sleep apnea    does not use cpap - UNABLE TO TOLERATE MASK   Sleep apnea in adult    deviated septum repaired, most recent sleep study was negative   Status post total right knee replacement 06/01/2015   Past Surgical History:  Procedure Laterality  Date   ANKLE FUSION Right 05/05/2012   related to his arthritis   ANKLE FUSION Left 05/25/2013   related to his arthritis   ANKLE SURGERY Bilateral    APPLICATION OF CRANIAL NAVIGATION N/A 04/11/2022   Procedure: APPLICATION OF CRANIAL NAVIGATION;   Surgeon: Consuella Lose, MD;  Location: [facility];  Service: Neurosurgery;  Laterality: N/A;   CARDIAC CATHETERIZATION N/A 05/01/2015   Procedure: Left Heart Cath and Coronary Angiography;  Surgeon: Belva Crome, MD;  Location: [facility];  Service: Cardiovascular;  Laterality: N/A;   CRANIOTOMY N/A 04/11/2022   Procedure: STEREOTACTIC SUBOCCIPITAL CRANIOTOMY FOR RESECTION OF ARTERIO-VENOUS MALFORMATION;  Surgeon: Consuella Lose, MD;  Location: [facility];  Service: Neurosurgery;  Laterality: N/A;   FRACTURE SURGERY     left femur fracture x 3   IR ANGIO EXTERNAL CAROTID SEL EXT CAROTID BILAT MOD SED  01/28/2022   IR ANGIO INTRA EXTRACRAN SEL INTERNAL CAROTID BILAT MOD SED  01/28/2022   IR ANGIO INTRA EXTRACRAN SEL INTERNAL CAROTID BILAT MOD SED  04/16/2022   IR ANGIO VERTEBRAL SEL VERTEBRAL BILAT MOD SED  01/28/2022   IR ANGIO VERTEBRAL SEL VERTEBRAL BILAT MOD SED  04/16/2022   KNEE ARTHROSCOPY     right x 4   LUMBAR LAMINECTOMY/DECOMPRESSION MICRODISCECTOMY N/A 07/18/2022   Procedure: Repair of Pseudomeningiocele posterior;  Surgeon: Consuella Lose, MD;  Location: [facility];  Service: Neurosurgery;  Laterality: N/A;   NASAL SEPTOPLASTY W/ TURBINOPLASTY  06/25/2011   Procedure: NASAL SEPTOPLASTY WITH TURBINATE REDUCTION;  Surgeon: Jerrell Belfast, MD;  Location: [facility];  Service: ENT;  Laterality: Bilateral;   PLACEMENT OF LUMBAR DRAIN N/A 07/18/2022   Procedure: PLACEMENT OF LUMBAR DRAIN;  Surgeon: Consuella Lose, MD;  Location: [facility];  Service: Neurosurgery;  Laterality: N/A;   TONSILLECTOMY     TOTAL KNEE ARTHROPLASTY Right 06/01/2015   Procedure: RIGHT TOTAL KNEE ARTHROPLASTY;  Surgeon: Mcarthur Rossetti, MD;  Location: [facility];  Service: Orthopedics;  Laterality: Right;  Block+general   Family History  Problem Relation Age of Onset   Lupus Sister    Social History:  reports that he quit smoking about 14 years ago. His smoking use included cigarettes. He has a 10.00  pack-year smoking history. He has never used smokeless tobacco. He reports current alcohol use. He reports that he does not use drugs. Allergies: No Known Allergies Medications Prior to Admission  Medication Sig Dispense Refill   aspirin 81 MG tablet Take 81 mg by mouth daily.     CALCIUM PO Take 1 tablet by mouth daily.     Cholecalciferol (VITAMIN D3) 50 MCG (2000 UT) TABS Take 2,000 Units by mouth daily.     clindamycin (CLEOCIN) 150 MG capsule Take 600 mg by mouth See admin instructions. Take 600mg  by mouth an hour prior to dental appointment.     diazepam (VALIUM) 5 MG tablet Take 1 tablet (5 mg total) by mouth every 8 (eight) hours as needed for muscle spasms. 30 tablet 0   fluticasone (FLONASE) 50 MCG/ACT nasal spray Place 1 spray into both nostrils daily as needed for allergies or rhinitis.     gabapentin (NEURONTIN) 300 MG capsule Take 300 mg by mouth 2 (two) times daily.     HYDROcodone-acetaminophen (NORCO/VICODIN) 5-325 MG tablet Take 1 tablet by mouth every 4 (four) hours as needed for up to 7 days for moderate pain. (Patient not taking: Reported on 07/30/2022) 30 tablet 0   lisinopril (ZESTRIL) 10 MG tablet TAKE 1  TABLET DAILY (Patient not taking: Reported on 05/09/2022) 90 tablet 3   methotrexate (RHEUMATREX) 2.5 MG tablet Take 20 mg by mouth once a week. Takes 8 tablets(20mg ) every Saturday.     Caution:Chemotherapy. Protect from light.     metoprolol succinate (TOPROL-XL) 25 MG 24 hr tablet TAKE 1 TABLET DAILY 90 tablet 2   Multiple Vitamins-Minerals (PRESERVISION AREDS 2 PO) Take 2 capsules by mouth daily.     predniSONE (DELTASONE) 5 MG tablet Take 20 mg by mouth daily as needed (Arthritis).     Propylene Glycol (SYSTANE COMPLETE) 0.6 % SOLN Place 1 drop into both eyes 2 (two) times daily.     RITUXAN 500 MG/50ML injection See admin instructions. Every 6 months 2 dose infusion Ruxience (Patient not taking: Reported on 04/22/2022)     rosuvastatin (CRESTOR) 20 MG tablet Take 1  tablet (20 mg total) by mouth daily. 90 tablet 3   sertraline (ZOLOFT) 50 MG tablet Take 50 mg by mouth every morning.     spironolactone (ALDACTONE) 25 MG tablet TAKE 1 TABLET DAILY (Patient taking differently: Take 12.5 mg by mouth daily.) 90 tablet 2   sulfaSALAzine (AZULFIDINE) 500 MG tablet Take 1,000 mg by mouth 2 (two) times daily.      traMADol (ULTRAM) 50 MG tablet Take 50 mg by mouth every 6 (six) hours as needed for moderate pain or severe pain.       Home: Home Living Family/patient expects to be discharged to:: Private residence Living Arrangements: Spouse/significant other, Children Available Help at Discharge: Available PRN/intermittently (spouse works 7-12:30 typically) Type of Home: House Home Access: Stairs to enter Technical brewer of Steps: 3 Entrance Stairs-Rails: Right Fried[place]: Two level, Able to live on main level with bedroom/bathroom Alternate Level Stairs-Number of Steps: flight Bathroom Shower/Tub: Multimedia programmer: Handicapped height Bathroom Accessibility: Yes Home Equipment: Conservation officer, nature (2 wheels), [place] - single point, Grab bars - toilet   Functional History: Prior Function Prior Level of Function : Needs assist Mobility Comments: independent with mobility prior to initial surgery in december. Has been ambulating with RW since surgery, multiple falls. Was ambulating with RW and assistance from spouse at all times since recent discharge on 3/22. ADLs Comments: assistance with all ADLs since discharge last week. Independent prior to initial surgery in December   Functional Status:  Mobility: Bed Mobility Overal bed mobility: Needs Assistance Bed Mobility: Supine to Sit, Sit to Supine Supine to sit: Supervision Sit to supine: Supervision General bed mobility comments: No physical assist required Transfers Overall transfer level: Needs assistance Equipment used: Rolling walker (2 wheels) Transfers: Sit to/from  Stand Sit to Stand: Min assist General transfer comment: Increased time to rise, light minA to power up to walker Ambulation/Gait Ambulation/Gait assistance: Min assist, Mod assist Gait Distance (Feet): 100 Feet Assistive device: Rolling walker (2 wheels) Gait Pattern/deviations: Decreased step length - left, Ataxic, Trunk flexed, Step-to pattern General Gait Details: Verbal cues for increased L step length, upright posture, environmental/obstacle negotiation. Pt requiring min assist overall for balance, regressing to modA with fatigue and increased L lateral lean in standing Gait velocity: reduced Gait velocity interpretation: <1.31 ft/sec, indicative of household ambulator   ADL: ADL Overall ADL's : Needs assistance/impaired Eating/Feeding: Independent, Sitting Grooming: Sitting, Min guard Upper Body Bathing: Min guard, Sitting Upper Body Bathing Details (indicate cue type and reason): Pt spouse reports sponge bathing recently PTA Lower Body Bathing: Sitting/lateral leans, Moderate assistance Lower Body Bathing Details (indicate cue type and  reason): Pt spouse reports sponge bathing recently PTA Upper Body Dressing : Min guard, Sitting Lower Body Dressing: Moderate assistance, Sitting/lateral leans Toilet Transfer: Rolling walker (2 wheels), Ambulation, Moderate assistance Toileting- Clothing Manipulation and Hygiene: Moderate assistance, Sit to/from stand Tub/ Shower Transfer: Moderate assistance, Rolling walker (2 wheels), Ambulation Functional mobility during ADLs: Rolling walker (2 wheels), Min guard General ADL Comments: Lateral stepping at bedside   Cognition: Cognition Overall Cognitive Status: Impaired/Different from baseline Orientation Level: Oriented X4 Cognition Arousal/Alertness: Awake/alert Behavior During Therapy: Flat affect Overall Cognitive Status: Impaired/Different from baseline Area of Impairment: Problem solving, Awareness Awareness: Emergent Problem  Solving: Requires verbal cues      Physical Exam: Blood pressure 104/65, pulse 75, temperature 98.4 F (36.9 C), resp. rate 16, height 6\' 1"  (1.854 m), weight 90.3 kg, SpO2 95 %.     General: NAD, lying in bed HEENT: Head with occipital swelling noted, PERRLA, EOMI, sclera anicteric, oral mucosa pink and moist, dentition intact, ext ear canals clear,  Neck: Supple without JVD or lymphadenopathy Heart: Reg rate and rhythm. No murmurs rubs or gallops Chest: CTA bilaterally without wheezes, rales, or rhonchi; no distress Abdomen: Soft, non-tender, non-distended, bowel sounds positive. Extremities: No clubbing, cyanosis, or edema. Pulses are 2+ Psych: Pt's affect is appropriate. Pt is cooperative Skin: Clean and intact without signs of breakdown Neuro: Alert and oriented x 4, makes eye contact, follows commands, dysarthria present, comprehension overall intact, fair insight and awareness, repetition intact, able to name 3 objects, cranial nerves II through XII intact Strength 5 out of 5 in bilateral upper extremities Strength 4+ out of 5 in proximal lower extremities, 5 out of 5 in distal lower extremities Sensation intact light touch in all 4 extremities No pronator drift FTN slightly altered L>R HTS b/l slightly slow to complete Musculoskeletal: Normal bulk and tone, no joint swelling noted Healed right TKA incision    Results for orders placed or performed during the hospital encounter of 07/28/22 (from the past 48 hour(s))  Basic metabolic panel     Status: Abnormal   Collection Time: 07/30/22  4:01 AM  Result Value Ref Range   Sodium 139 135 - 145 mmol/L   Potassium 3.7 3.5 - 5.1 mmol/L   Chloride 99 98 - 111 mmol/L   CO2 28 22 - 32 mmol/L   Glucose, Bld 120 (H) 70 - 99 mg/dL    Comment: Glucose reference range applies only to samples taken after fasting for at least 8 hours.   BUN 9 6 - 20 mg/dL   Creatinine, Ser 0.78 0.61 - 1.24 mg/dL   Calcium 9.2 8.9 - 10.3 mg/dL    GFR, Estimated >60 >60 mL/min    Comment: (NOTE) Calculated using the CKD-EPI Creatinine Equation (2021)    Anion gap 12 5 - 15    Comment: Performed at [facility] [address], [place], [place] [address]  CBC     Status: Abnormal   Collection Time: 07/30/22  4:01 AM  Result Value Ref Range   WBC 7.7 4.0 - 10.5 K/uL   RBC 4.66 4.22 - 5.81 MIL/uL   Hemoglobin 10.7 (L) 13.0 - 17.0 g/dL   HCT 34.7 (L) 39.0 - 52.0 %   MCV 74.5 (L) 80.0 - 100.0 fL   MCH 23.0 (L) 26.0 - 34.0 pg   MCHC 30.8 30.0 - 36.0 g/dL   RDW 18.6 (H) 11.5 - 15.5 %   Platelets 231 150 - 400 K/uL    Comment:  REPEATED TO VERIFY   nRBC 0.0 0.0 - 0.2 %    Comment: Performed at [facility], [place] [address], [place], [place] [address]  Magnesium     Status: None   Collection Time: 07/30/22  4:01 AM  Result Value Ref Range   Magnesium 1.9 1.7 - 2.4 mg/dL    Comment: Performed at [facility] [address], [place], [place] [address]  Brain natriuretic peptide     Status: None   Collection Time: 07/30/22  1:47 PM  Result Value Ref Range   B Natriuretic Peptide 22.5 0.0 - 100.0 pg/mL    Comment: Performed at [facility] [address], [place], [place] [id number]  Basic metabolic panel     Status: Abnormal   Collection Time: 07/31/22  6:40 AM  Result Value Ref Range   Sodium 140 135 - 145 mmol/L   Potassium 3.5 3.5 - 5.1 mmol/L   Chloride 102 98 - 111 mmol/L   CO2 28 22 - 32 mmol/L   Glucose, Bld 125 (H) 70 - 99 mg/dL    Comment: Glucose reference range applies only to samples taken after fasting for at least 8 hours.   BUN 16 6 - 20 mg/dL   Creatinine, Ser 0.82 0.61 - 1.24 mg/dL   Calcium 9.1 8.9 - 10.3 mg/dL   GFR, Estimated >60 >60 mL/min    Comment: (NOTE) Calculated using the CKD-EPI Creatinine Equation (2021)    Anion gap 10 5 - 15    Comment: Performed at [facility] [address], [place], [place] [address]  CBC     Status: Abnormal   Collection Time: 07/31/22   6:40 AM  Result Value Ref Range   WBC 10.2 4.0 - 10.5 K/uL   RBC 4.37 4.22 - 5.81 MIL/uL   Hemoglobin 10.4 (L) 13.0 - 17.0 g/dL   HCT 31.7 (L) 39.0 - 52.0 %   MCV 72.5 (L) 80.0 - 100.0 fL   MCH 23.8 (L) 26.0 - 34.0 pg   MCHC 32.8 30.0 - 36.0 g/dL   RDW 18.5 (H) 11.5 - 15.5 %   Platelets 241 150 - 400 K/uL    Comment: REPEATED TO VERIFY   nRBC 0.0 0.0 - 0.2 %    Comment: Performed at [facility], [place] [address], [place],  [address]  Magnesium     Status: None   Collection Time: 07/31/22  6:40 AM  Result Value Ref Range   Magnesium 2.0 1.7 - 2.4 mg/dL    Comment: Performed at [facility] [address], [place],  [address]   DG Swallowing Func-Speech Pathology  Result Date: 07/30/2022 Table formatting from the original result was not included. Modified Barium Swallow Study Patient Details Name: Hiro Dufour MRN: [id number] Date of Birth: 1961/03/13 Today's Date: 07/30/2022 HPI/PMH: HPI: 62 y.o. male presents to [facility] hospital on 07/28/2022 with slurred speech, worsening gait and HA. Pt recently discharged 07/25/2022 after operative repair of pseudomeningiocele. MRI brain 3/25 demonstrates acute R cerebellar infarct. PMH: for CAD, R TKA, cardiomyopathy, chronic systolic heart failure, macular degeneration. Clinical Impression: Clinical Impression: Pt demonstrated minimal oral dysphagia with trace lingual residue present intermittently and mild pharyngeal dysphagia. Pt exhibited decreased laryngeal elevation with thin barium entering airway prior to full laryngeal closure resulting in penetration remaining on the anterior portion of vestibule. A chin tuck posture was not effective and he continued to penetrate with spontaeous cough that  expelled penetrates. A head turn to left with thin resulted in sensed aspiration with weak cough. Nectar thick was also penetrated during and after the swallow and chin tuck was ineffective however a head turn to the left was 90%  effective to prevent penetration over multiple trials. Reduced tonuge base retraction led to minimal vallecular residue consistently. Pt was able to masticate solid texture with flash penetration. Given deconditioning recommend he start a more conservative diet texture of Dys 2 (minced), nectar thick with head turn to the left with liquids, intermittent cough/throat clear, no straws, small sips and initiate meds crushed in puree (can advance to whole in puree as able). Factors that may increase risk of adverse event in presence of aspiration ([place] 2021): No data recorded Recommendations/Plan: Swallowing Evaluation Recommendations Swallowing Evaluation Recommendations Recommendations: PO diet PO Diet Recommendation: Dysphagia 2 (Finely chopped); Mildly thick liquids (Level 2, nectar thick) Liquid Administration via: Cup; No straw Medication Administration: Crushed with puree Supervision: Staff to assist with self-feeding; Full supervision/cueing for swallowing strategies Swallowing strategies  : Head turn left during swallowing (with liquids) Postural changes: Position pt fully upright for meals Oral care recommendations: Oral care BID (2x/day) Treatment Plan Treatment Plan Treatment recommendations: Therapy as outlined in treatment plan below Follow-up recommendations: Acute inpatient rehab (3 hours/day) Functional status assessment: Patient has had a recent decline in their functional status and demonstrates the ability to make significant improvements in function in a reasonable and predictable amount of time. Treatment frequency: Min 2x/week Treatment duration: 2 weeks Interventions: Aspiration precaution training; Patient/family education; Compensatory techniques; Trials of upgraded texture/liquids Recommendations Recommendations for follow up therapy are one component of a multi-disciplinary discharge planning process, led by the attending physician.  Recommendations may be updated based on patient  status, additional functional criteria and insurance authorization. Assessment: Orofacial Exam: Orofacial Exam Oral Cavity: Oral Hygiene: WFL Oral Cavity - Dentition: Adequate natural dentition Orofacial Anatomy: Other (comment) Oral Motor/Sensory Function: Suspected cranial nerve impairment CN V - Trigeminal: WFL CN VII - Facial: Left motor impairment CN IX - Glossopharyngeal, CN X - Vagus: Not tested Anatomy: Anatomy: WFL Boluses Administered: Boluses Administered Boluses Administered: Thin liquids (Level 0); Mildly thick liquids (Level 2, nectar thick); Moderately thick liquids (Level 3, honey thick); Puree; Solid  Oral Impairment Domain: Oral Impairment Domain Lip Closure: No labial escape Tongue control during bolus hold: Cohesive bolus between tongue to palatal seal Bolus preparation/mastication: Timely and efficient chewing and mashing Bolus transport/lingual motion: Brisk tongue motion Oral residue: Trace residue lining oral structures Location of oral residue : Tongue Initiation of pharyngeal swallow : Pyriform sinuses  Pharyngeal Impairment Domain: Pharyngeal Impairment Domain Soft palate elevation: No bolus between soft palate (SP)/pharyngeal wall (PW) Laryngeal elevation: Partial superior movement of thyroid cartilage/partial approximation of arytenoids to epiglottic petiole Anterior hyoid excursion: Partial anterior movement Epiglottic movement: Complete inversion Laryngeal vestibule closure: Incomplete, narrow column air/contrast in laryngeal vestibule Pharyngeal stripping wave : Present - complete Pharyngeal contraction (A/P view only): N/A Pharyngoesophageal segment opening: Complete distension and complete duration, no obstruction of flow Tongue base retraction: Trace column of contrast or air between tongue base and PPW Pharyngeal residue: Collection of residue within or on pharyngeal structures Location of pharyngeal residue: Valleculae  Esophageal Impairment Domain: Esophageal Impairment Domain  Esophageal clearance upright position: Complete clearance, esophageal coating Pill: Esophageal Impairment Domain Esophageal clearance upright position: Complete clearance, esophageal coating Penetration/Aspiration Scale Score: Penetration/Aspiration Scale Score 2.  Material enters airway, remains ABOVE vocal cords then ejected out:  Thin liquids (Level 0) 3.  Material enters airway, remains ABOVE vocal cords and not ejected out: Thin liquids (Level 0) 4.  Material enters airway, CONTACTS cords then ejected out: Mildly thick liquids (Level 2, nectar thick) Compensatory Strategies: Compensatory Strategies Compensatory strategies: Yes Chin tuck: Ineffective Ineffective Chin Tuck: Thin liquid (Level 0); Mildly thick liquid (Level 2, nectar thick) Left head turn: Effective (ineffective with thin, effective with nectar) Effective Left Head Turn: Mildly thick liquid (Level 2, nectar thick) Ineffective Left Head Turn: Thin liquid (Level 0)   General Information: Caregiver present: No  Diet Prior to this Study: NPO   Temperature : Normal   Respiratory Status: WFL   Supplemental O2: None (Room air)   History of Recent Intubation: No  Behavior/Cognition: Alert; Cooperative; Pleasant mood Self-Feeding Abilities: Able to self-feed Baseline vocal quality/speech: Hypophonia/low volume Volitional Cough: Able to elicit Volitional Swallow: Able to elicit No data recorded Goal Planning: Prognosis for improved oropharyngeal function: Good No data recorded No data recorded No data recorded Consulted and agree with results and recommendations: Patient; Family member/caregiver Pain: Pain Assessment Pain Assessment: No/denies pain Pain Score: 5 Faces Pain Scale: 4 Pain Location: Headache Pain Descriptors / Indicators: Grimacing Pain Intervention(s): Monitored during session End of Session: Start Time:SLP Start Time (ACUTE ONLY): 1038 Stop Time: SLP Stop Time (ACUTE ONLY): 1051 Time Calculation:SLP Time Calculation (min) (ACUTE ONLY): 13 min  Charges: SLP Evaluations $ SLP Speech Visit: 1 Visit SLP Evaluations $BSS Swallow: 1 Procedure $MBS Swallow: 1 Procedure SLP visit diagnosis: SLP Visit Diagnosis: Dysphagia, oropharyngeal phase (R13.12) Past Medical History: Past Medical History: Diagnosis Date  Anxiety   Arthritis, rheumatoid ([facility])   dx 1988  CAD (coronary artery disease), native coronary artery   Mild LAD disease with calcification noted at cath 12//27/16   Cardiomyopathy ([place])   Cardiomyopathy ([place])   Chronic systolic heart failure ([place]) 05/01/2015  Depression   Deviated nasal septum 06/25/2011  Headache   High cholesterol   Hyperlipidemia   Impingement syndrome of left shoulder 12/22/2017  Macular degeneration   Rheumatoid aortitis   Sleep apnea   does not use cpap - UNABLE TO TOLERATE MASK  Sleep apnea in adult   deviated septum repaired, most recent sleep study was negative  Status post total right knee replacement 06/01/2015 Past Surgical History: Past Surgical History: Procedure Laterality Date  ANKLE FUSION Right 05/05/2012  related to his arthritis  ANKLE FUSION Left 05/25/2013  related to his arthritis  ANKLE SURGERY Bilateral   APPLICATION OF CRANIAL NAVIGATION N/A [id number]  Procedure: APPLICATION OF CRANIAL NAVIGATION;  Surgeon: Consuella Lose, MD;  Location: [facility];  Service: Neurosurgery;  Laterality: N/A;  CARDIAC CATHETERIZATION N/A 05/01/2015  Procedure: Left Heart Cath and Coronary Angiography;  Surgeon: Belva Crome, MD;  Location: [facility];  Service: Cardiovascular;  Laterality: N/A;  CRANIOTOMY N/A 04/11/2022  Procedure: STEREOTACTIC SUBOCCIPITAL CRANIOTOMY FOR RESECTION OF ARTERIO-VENOUS MALFORMATION;  Surgeon: Consuella Lose, MD;  Location: [facility];  Service: Neurosurgery;  Laterality: N/A;  FRACTURE SURGERY    left femur fracture x 3  IR ANGIO EXTERNAL CAROTID SEL EXT CAROTID BILAT MOD SED  01/28/2022  IR ANGIO INTRA EXTRACRAN SEL INTERNAL CAROTID BILAT MOD SED  01/28/2022  IR ANGIO INTRA EXTRACRAN SEL  INTERNAL CAROTID BILAT MOD SED  04/16/2022  IR ANGIO VERTEBRAL SEL VERTEBRAL BILAT MOD SED  01/28/2022  IR ANGIO VERTEBRAL SEL VERTEBRAL BILAT MOD SED  04/16/2022  KNEE ARTHROSCOPY    right x 4  LUMBAR  LAMINECTOMY/DECOMPRESSION MICRODISCECTOMY N/A 07/18/2022  Procedure: Repair of Pseudomeningiocele posterior;  Surgeon: Consuella Lose, MD;  Location: [facility];  Service: Neurosurgery;  Laterality: N/A;  NASAL SEPTOPLASTY W/ TURBINOPLASTY  06/25/2011  Procedure: NASAL SEPTOPLASTY WITH TURBINATE REDUCTION;  Surgeon: Jerrell Belfast, MD;  Location: [facility];  Service: ENT;  Laterality: Bilateral;  PLACEMENT OF LUMBAR DRAIN N/A 07/18/2022  Procedure: PLACEMENT OF LUMBAR DRAIN;  Surgeon: Consuella Lose, MD;  Location: [facility];  Service: Neurosurgery;  Laterality: N/A;  TONSILLECTOMY    TOTAL KNEE ARTHROPLASTY Right 06/01/2015  Procedure: RIGHT TOTAL KNEE ARTHROPLASTY;  Surgeon: Mcarthur Rossetti, MD;  Location: [facility];  Service: Orthopedics;  Laterality: Right;  Block+general [place] Siren 07/30/2022, 12:27 PM  ECHOCARDIOGRAM COMPLETE  Result Date: 07/29/2022    ECHOCARDIOGRAM REPORT   Patient Name:   GUINN WEISHAUPT Date of Exam: 07/29/2022 Medical Rec #:  [id number]                   Height:       73.0 in Accession #:    [id number]                  Weight:       199.0 lb Date of Birth:  [birth year]                   BSA:          2.147 m Patient Age:    85 years                    BP:           124/103 mmHg Patient Gender: M                           HR:           93 bpm. Exam Location:  Inpatient Procedure: 2D Echo, Cardiac Doppler and Color Doppler Indications:    Stroke I63.9  History:        Patient has prior history of Echocardiogram examinations, most                 recent 06/22/2017. Cardiomyopathy and CHF, CAD, Stroke; Risk                 Factors:Sleep Apnea and Dyslipidemia.  Sonographer:    Ronny Flurry Referring Phys: [id number] TIMOTHY S OPYD  Sonographer Comments: No subcostal window.  IMPRESSIONS  1. Left ventricular ejection fraction, by estimation, is 40 to 45%. The left ventricle has mildly decreased function. The left ventricle demonstrates global hypokinesis. Left ventricular diastolic parameters are consistent with Grade I diastolic dysfunction (impaired relaxation).  2. Right ventricular systolic function is normal. The right ventricular size is normal.  3. The mitral valve is normal in structure. No evidence of mitral valve regurgitation.  4. Suspect bicuspid aortic valve. There is mild low flow-low gradient aortic stenosis (gradients are underestimated due to low stroke volume). The aortic valve is bicuspid. There is mild calcification of the aortic valve. There is moderate thickening of  the aortic valve. Aortic valve regurgitation is not visualized. Mild aortic valve stenosis. Aortic valve mean gradient measures 7.0 mmHg. Aortic valve Vmax measures 1.78 m/s.  5. Aortic dilatation noted. There is moderate dilatation of the aortic root, measuring 45 mm. Comparison(s): Prior images unable to be directly viewed, comparison made by report only. The aortic valve is identified as bicuspid, otherwise no change.  FINDINGS  Left Ventricle: Left ventricular ejection fraction, by estimation, is 40 to 45%. The left ventricle has mildly decreased function. The left ventricle demonstrates global hypokinesis. The left ventricular internal cavity size was normal in size. There is  borderline concentric left ventricular hypertrophy. Left ventricular diastolic parameters are consistent with Grade I diastolic dysfunction (impaired relaxation). Normal left ventricular filling pressure. Right Ventricle: The right ventricular size is normal. No increase in right ventricular wall thickness. Right ventricular systolic function is normal. Left Atrium: Left atrial size was normal in size. Right Atrium: Right atrial size was normal in size. Pericardium: There is no evidence of pericardial effusion. Mitral Valve:  The mitral valve is normal in structure. No evidence of mitral valve regurgitation. Tricuspid Valve: The tricuspid valve is normal in structure. Tricuspid valve regurgitation is not demonstrated. Aortic Valve: Suspect bicuspid aortic valve. There is mild low flow-low gradient aortic stenosis (gradients are underestimated due to low stroke volume). The aortic valve is bicuspid. There is mild calcification of the aortic valve. There is moderate thickening of the aortic valve. Aortic valve regurgitation is not visualized. Mild aortic stenosis is present. Aortic valve mean gradient measures 7.0 mmHg. Aortic valve peak gradient measures 12.7 mmHg. Aortic valve area, by VTI measures 1.97 cm. Pulmonic Valve: The pulmonic valve was grossly normal. Pulmonic valve regurgitation is not visualized. Aorta: Aortic dilatation noted. There is moderate dilatation of the aortic root, measuring 45 mm. IAS/Shunts: No atrial level shunt detected by color flow Doppler.  LEFT VENTRICLE PLAX 2D LVIDd:         4.60 cm LVIDs:         3.20 cm LV PW:         1.10 cm LV IVS:        1.20 cm LVOT diam:     2.60 cm LV SV:         60 LV SV Index:   28 LVOT Area:     5.31 cm  RIGHT VENTRICLE RV S prime:     12.90 cm/s LEFT ATRIUM             Index        RIGHT ATRIUM           Index LA diam:        3.60 cm 1.68 cm/m   RA Area:     15.70 cm LA Vol (A2C):   39.7 ml 18.49 ml/m  RA Volume:   31.45 ml  14.65 ml/m LA Vol (A4C):   31.0 ml 14.44 ml/m LA Biplane Vol: 36.5 ml 17.00 ml/m  AORTIC VALVE AV Area (Vmax):    2.01 cm AV Area (Vmean):   1.93 cm AV Area (VTI):     1.97 cm AV Vmax:           178.50 cm/s AV Vmean:          121.500 cm/s AV VTI:            0.302 m AV Peak Grad:      12.7 mmHg AV Mean Grad:      7.0 mmHg LVOT Vmax:         67.42 cm/s LVOT Vmean:        44.100 cm/s LVOT VTI:          0.112 m LVOT/AV VTI ratio: 0.37  AORTA Ao Root diam: 4.55 cm Ao Asc diam:  4.20 cm MITRAL VALVE MV Area (PHT): 3.77 cm    SHUNTS MV Decel Time:  201  msec    Systemic VTI:  0.11 m MV E velocity: 38.00 cm/s  Systemic Diam: 2.60 cm MV A velocity: 75.10 cm/s MV E/A ratio:  0.51 Mihai Croitoru MD Electronically signed by Sanda Klein MD Signature Date/Time: 07/29/2022/4:13:22 PM    Final       Blood pressure 104/65, pulse 75, temperature 98.4 F (36.9 C), resp. rate 16, height 6\' 1"  (1.854 m), weight 90.3 kg, SpO2 95 %.    Medical Problem List and Plan: 1. Functional deficits secondary to question right cerebellar infarct versus postoperative changes from recent pseudomeningocele repair with worsening ataxia, dysarthria and dysphagia as well as history of AVM resection December 2023.Marland Kitchen  Decadron as indicated 4 MG Q8 HOURS with TAPER STARTING TOMORROW  -patient may shower  -ELOS/Goals: 7-10 days, Mod I PT/OT/SLP  -Admit to CIR 2.  Antithrombotics: -DVT/anticoagulation:  Pharmaceutical: Lovenox  -antiplatelet therapy: Aspirin 81 mg daily 3. Pain Management: Tylenol as needed, Valium as needed muscle spasms 4. Mood/Behavior/Sleep: Zoloft 50 mg daily  -antipsychotic agents: N/A 5. Neuropsych/cognition: This patient is capable of making decisions on his own behalf. 6. Skin/Wound Care: Routine skin checks 7. Fluids/Electrolytes/Nutrition: Routine in and outs with follow-up chemistries 8.  Hyperlipidemia.  Crestor 20mg  daily 9.  Rheumatoid arthritis.  Consider resuming home regimen of rituximab, methotrexate and sulfasalazine after steroids completed.  Currently on steroids.  10.  CAD.  Continue low-dose aspirin. Denies CP 11.  Dysphagia.Regular diet with nectar liquids.Follow up SLP 12.  Chronic systolic congestive heart failure.  Monitor for any signs of fluid overload. Daily weight  13.  OSA.Intolerance to CPAP. Counseling on CPAP benefits.  14. Prediabetes. A1C 6.0. Monitor on CBC/CMP   Jennye Boroughs, MD 07/31/2022

## 2022-07-31 NOTE — PMR Pre-admission (Signed)
PMR Admission Coordinator Pre-Admission Assessment  Patient: Christopher Yu is an 62 y.o., male MRN: [id number] DOB: [birth year] Height: 6\' 1"  (185.4 cm) Weight: 90.3 kg              Insurance Information HMO:     PPO: yes     PCP:      IPA:      80/20:      OTHER:  PRIMARY: [employer] [place]      Policy#: [id number]      Subscriber: pt CM Name: approved via portal      Phone#: [phone]     Fax#: via [place] hub or [phone] Pre-Cert#: [id number] approved 3/28 until 08/06/22      Employer: [employer] Benefits:  Phone #: [phone]     Name: 3/28 Eff. Date: 05/10/20     Deduct: $6000      Out of Pocket Max: $8000      Life Max: none  CIR: 90%      SNF: 90% Outpatient: 90%     Co-Pay: 30 visits combined ( 28 remaining) Home Health: 90%      Co-Pay: 10% DME: 90%     Co-Pay: 10% Providers: In network  SECONDARY: none  Development worker, community:       Phone#:   [employer] Engineer, [employer]" for patients in Inpatient Rehabilitation Facilities with attached "Privacy Act [place] Records" was provided and verbally reviewed with: N/A  Emergency Contact Information Contact Information     Name Relation Home Work Mobile   [place] Spouse [phone]  [phone]      Current Medical History  Patient Admitting Diagnosis: pseudomeningocele repair  History of Present Illness:  62 year old right-handed male with history significant for rheumatoid arthritis maintained on methotrexate, CAD maintained on low-dose aspirin, OSA with CPAP intolerance, quit smoking 14 years ago, chronic systolic congestive heart failure, 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 Kathyrn Sheriff and discharged home 07/25/2022. Patient presented on 07/28/22 with progressive slurred speech gait abnormality and headache.    CT/MRI showed new small focus of acute infarction in the right superior cerebellar  hemisphere with surrounding area of increased T2 signal and cystic change new since prior comparison 07/05/2022.  CT angiogram head and neck no vascular lesions seen underlying the cerebellar infarct.  Echocardiogram with ejection fraction of 40 to 45% left ventricle demonstrating global hypokinesis with grade 1 diastolic dysfunction.  Neurology Dr. Leonie Man as well as neurosurgery follow-up Dr. Duffy Rhody follow-up currently maintained on low-dose aspirin.  No current plan for neurosurgical intervention.  Lovenox added for DVT prophylaxis.  Neurology as well as neurosurgery felt that small DWI hyperintensity identified on imaging not felt to be typical of stroke and more likely to be postoperative change.  He was started on steroids with dexamethasone taper.  He is currently on a regular diet with nectar thick liquid diet.    Patient[facility] from [facility] has been reviewed by the rehabilitation admission coordinator and physician.  Past Medical History  Past Medical History:  Diagnosis Date   Anxiety    Arthritis, rheumatoid ([place])    dx 1988   CAD (coronary artery disease), native coronary artery    Mild LAD disease with calcification noted at cath 12//27/16    Cardiomyopathy ([place])    Cardiomyopathy ([place])    Chronic systolic heart failure ([place]) 05/01/2015   Depression    Deviated nasal septum 06/25/2011   Headache  High cholesterol    Hyperlipidemia    Impingement syndrome of left shoulder 12/22/2017   Macular degeneration    Rheumatoid aortitis    Sleep apnea    does not use cpap - UNABLE TO TOLERATE MASK   Sleep apnea in adult    deviated septum repaired, most recent sleep study was negative   Status post total right knee replacement 06/01/2015   Has the patient had major surgery during 100 days prior to admission? Yes  Family History  family history includes Lupus in his sister.   Current Medications   Current Facility-Administered Medications:     acetaminophen (TYLENOL) tablet 650 mg, 650 mg, Oral, Q4H PRN, 650 mg at 07/29/22 1058 **OR** acetaminophen (TYLENOL) 160 MG/5ML solution 650 mg, 650 mg, Per Tube, Q4H PRN **OR** acetaminophen (TYLENOL) suppository 650 mg, 650 mg, Rectal, Q4H PRN, Opyd, Ilene Qua, MD   aspirin EC tablet 81 mg, 81 mg, Oral, Daily, Opyd, Ilene Qua, MD, 81 mg at 07/30/22 1106   dexamethasone (DECADRON) injection 4 mg, 4 mg, Intravenous, Q8H, Lavina Hamman, MD, 4 mg at 07/31/22 [id number]   diazepam (VALIUM) tablet 5 mg, 5 mg, Oral, Q8H PRN, Opyd, Timothy S, MD, 5 mg at 07/29/22 1058   enoxaparin (LOVENOX) injection 40 mg, 40 mg, Subcutaneous, Q24H, Opyd, Ilene Qua, MD, 40 mg at 07/30/22 2055   rosuvastatin (CRESTOR) tablet 20 mg, 20 mg, Oral, Daily, Opyd, Timothy S, MD, 20 mg at 07/30/22 1106   senna-docusate (Senokot-S) tablet 1 tablet, 1 tablet, Oral, QHS PRN, Opyd, Ilene Qua, MD   sertraline (ZOLOFT) tablet 50 mg, 50 mg, Oral, q morning, Opyd, Ilene Qua, MD, 50 mg at 07/30/22 1106  Patients Current Diet:  Diet Order             Diet regular Room service appropriate? No; Fluid consistency: Nectar Thick  Diet effective now                   Precautions / Restrictions Precautions Precautions: Fall Restrictions Weight Bearing Restrictions: No   Has the patient had 2 or more falls or a fall with injury in the past year?Yes  Prior Activity Level Community (5-7x/wk): was independent and working prior to surgery 12/23  Prior Functional Level Prior Function Prior Level of Function : Needs assist Mobility Comments: independent with mobility prior to initial surgery in december. Has been ambulating with RW since surgery, multiple falls. Was ambulating with RW and assistance from spouse at all times since recent discharge on 3/22. ADLs Comments: assistance with all ADLs since discharge last week. Independent prior to initial surgery in December  Self Care: Did the patient need help bathing, dressing, using the  toilet or eating?  Independent  Indoor Mobility: Did the patient need assistance with walking from room to room (with or without device)? Independent  Stairs: Did the patient need assistance with internal or external stairs (with or without device)? Independent  Functional Cognition: Did the patient need help planning regular tasks such as shopping or remembering to take medications? Independent  Patient Information Are you of Hispanic, Latino/a,or Spanish origin?: A. No, not of Hispanic, Latino/a, or Spanish origin What is your race?: C. American [place] or [place] Native ([place]) Do you need or want an interpreter to communicate with a doctor or health care staff?: 0. No  Patient's Response To:  Health Literacy and Transportation Is the patient able to respond to health literacy and transportation needs?: Yes Health Literacy -  How often do you need to have someone help you when you read instructions, pamphlets, or other written material from your doctor or pharmacy?: Never In the past 12 months, has lack of transportation kept you from medical appointments or from getting medications?: No In the past 12 months, has lack of transportation kept you from meetings, work, or from getting things needed for daily living?: No  Home Assistive Devices / [place] Devices/Equipment: Environmental consultant (specify type), Shower chair with back, Transfer belt Home Equipment: Conservation officer, nature (2 wheels), Shower seat, Cane - single point, Grab bars - toilet  Prior Device Use: Indicate devices/aids used by the patient prior to current illness, exacerbation or injury? None of the above recently using RW since last surgery and d/c 3/22  Current Functional Level Cognition  Arousal/Alertness: Awake/alert Overall Cognitive Status: Impaired/Different from baseline Orientation Level: Oriented to person, Oriented to place, Oriented to situation (oriented to year, not day of  week) Attention: Sustained Sustained Attention: Appears intact Memory: Appears intact (recalled 5/5 words) Awareness: Impaired Awareness Impairment: Intellectual impairment, Anticipatory impairment Problem Solving: Impaired Problem Solving Impairment: Verbal basic Safety/Judgment: Impaired    Extremity Assessment (includes Sensation/Coordination)  Upper Extremity Assessment: RUE deficits/detail, LUE deficits/detail RUE Deficits / Details: ROM WFL, Dysmetria present RUE Coordination: decreased gross motor (undershoots finger to nose) LUE Deficits / Details: ROM WFL, Dysmetria present LUE Coordination: decreased gross motor (undershoots finger to nose)  Lower Extremity Assessment: Generalized weakness    ADLs  Overall ADL's : Needs assistance/impaired Eating/Feeding: Independent, Sitting Grooming: Sitting, Min guard Upper Body Bathing: Min guard, Sitting Upper Body Bathing Details (indicate cue type and reason): Pt spouse reports sponge bathing recently PTA Lower Body Bathing: Sitting/lateral leans, Moderate assistance Lower Body Bathing Details (indicate cue type and reason): Pt spouse reports sponge bathing recently PTA Upper Body Dressing : Min guard, Sitting Lower Body Dressing: Moderate assistance, Sitting/lateral leans Toilet Transfer: Rolling walker (2 wheels), Ambulation, Moderate assistance Toileting- Clothing Manipulation and Hygiene: Moderate assistance, Sit to/from stand Tub/ Shower Transfer: Moderate assistance, Rolling walker (2 wheels), Ambulation Functional mobility during ADLs: Rolling walker (2 wheels), Min guard General ADL Comments: Lateral stepping at bedside    Mobility  Overal bed mobility: Needs Assistance Bed Mobility: Supine to Sit, Sit to Supine Supine to sit: Supervision Sit to supine: Supervision General bed mobility comments: No physical assist required    Transfers  Overall transfer level: Needs assistance Equipment used: Rolling walker (2  wheels) Transfers: Sit to/from Stand Sit to Stand: Min assist General transfer comment: Increased time to rise, light minA to power up to walker    Ambulation / Gait / Stairs / Wheelchair Mobility  Ambulation/Gait Ambulation/Gait assistance: Min assist, Mod assist Gait Distance (Feet): 100 Feet Assistive device: Rolling walker (2 wheels) Gait Pattern/deviations: Decreased step length - left, Ataxic, Trunk flexed, Step-to pattern General Gait Details: Verbal cues for increased L step length, upright posture, environmental/obstacle negotiation. Pt requiring min assist overall for balance, regressing to modA with fatigue and increased L lateral lean in standing Gait velocity: reduced Gait velocity interpretation: <1.31 ft/sec, indicative of household ambulator    Posture / Balance Dynamic Sitting Balance Sitting balance - Comments: L lean, able to correct and return to midline with visual cues Balance Overall balance assessment: Needs assistance Sitting-balance support: Feet supported Sitting balance-Leahy Scale: Fair Sitting balance - Comments: L lean, able to correct and return to midline with visual cues Postural control: Left lateral lean Standing balance support: No upper extremity supported, During  functional activity Standing balance-Leahy Scale: Poor Standing balance comment: Requiring min guard when brushing teeth at sink    Special needs/care consideration Fall precautions     Previous Home Environment Living Arrangements: Spouse/significant other, Children  Lives With: Spouse Available Help at Discharge: Available 24 hours/day (I asked wife to take FMLA for 24/7 supervision after CIR) Type of Home: House Home Layout: Two level, Able to live on main level with bedroom/bathroom Alternate Level Stairs-Number of Steps: flight Home Access: Stairs to enter Entrance Stairs-Rails: Right Entrance Stairs-Number of Steps: 3 Bathroom Shower/Tub: Multimedia programmer:  Handicapped height Bathroom Accessibility: Yes Golf: No  Discharge Living Setting Plans for Discharge Living Setting: Patient's home, Lives with (comment) (wife) Type of Home at Discharge: House Discharge Home Layout: Two level, Able to live on main level with bedroom/bathroom Alternate Level Stairs-Number of Steps: flight Discharge Home Access: Stairs to enter Entrance Stairs-Rails: Right Entrance Stairs-Number of Steps: 3 Discharge Bathroom Shower/Tub: Walk-in shower Discharge Bathroom Toilet: Handicapped height Discharge Bathroom Accessibility: Yes How Accessible: Accessible via walker Does the patient have any problems obtaining your medications?: No  Social/Family/Support Systems Patient Roles: Spouse, Parent (employee) Contact Information: wife, Kathlee [place] Anticipated Caregiver: wife Anticipated Ambulance person Information: see contacts Ability/Limitations of Caregiver: wife works but can take [employer] if needed Caregiver Availability: 24/7 Discharge Plan Discussed with Primary Caregiver: Yes Is Caregiver In Agreement with Plan?: Yes Does Caregiver/Family have Issues with Lodging/Transportation while Pt is in Rehab?: No  Goals Patient/Family Goal for Rehab: Mod I with PT, OT and SLP Expected length of stay: ELOS 7 to 10 days Pt/Family Agrees to Admission and willing to participate: Yes Program Orientation Provided & Reviewed with Pt/Caregiver Including Roles  & Responsibilities: Yes  Decrease burden of Care through IP rehab admission: n/a  Possible need for SNF placement upon discharge:not anticipated  Patient Condition: This patient's condition remains as documented in the consult dated 07/30/22, in which the Rehabilitation Physician determined and documented that the patient's condition is appropriate for intensive rehabilitative care in an inpatient rehabilitation facility. Will admit to inpatient rehab today.  Preadmission Screen Completed By:  Cleatrice Burke, RN, MSN 07/31/2022 11:21 AM ______________________________________________________________________   Discussed status with Dr. Curlene Dolphin on 07/31/22 at 1123 and received approval for admission today.  Admission Coordinator:  Cleatrice Burke, time [id number] Date 07/31/22

## 2022-07-31 NOTE — Progress Notes (Signed)
Jennye Boroughs, MD  Physician Physical Medicine and Rehabilitation   PMR Pre-admission    Signed   Date of Service: 07/31/2022 10:54 AM  Related encounter: ED to Hosp-Admission (Discharged) from 07/28/2022 in [facility]   Signed      Show:Clear all [x] Written[x] Templated[x] Copied  Added by: [x] Cristina Gong, RN  [] Hover for details PMR Admission Coordinator Pre-Admission Assessment   Patient: Christopher Yu is an 62 y.o., male MRN: [id number] DOB: 05/17/1960 Height: 6\' 1"  (185.4 cm) Weight: 90.3 kg                                                                                                                                                  Insurance Information HMO:     PPO: yes     PCP:      IPA:      80/20:      OTHER:  PRIMARY: [employer] [place]      Policy#: [id number]      Subscriber: pt CM Name: approved via portal      Phone#: [phone]     Fax#: via [place] hub or [phone] Pre-Cert#: [id number] approved 3/28 until 08/06/22      Employer: [employer] Benefits:  Phone #: [phone]     Name: 3/28 Eff. Date: 05/10/20     Deduct: $6000      Out of Pocket Max: $8000      Life Max: none  CIR: 90%      SNF: 90% Outpatient: 90%     Co-Pay: 30 visits combined ( 28 remaining) Home Health: 90%      Co-Pay: 10% DME: 90%     Co-Pay: 10% Providers: In network  SECONDARY: none   Development worker, community:       Phone#:    The Engineer, petroleum" for patients in Inpatient Rehabilitation Facilities with attached "Privacy Act [place] Records" was provided and verbally reviewed with: N/A   Emergency Contact Information Contact Information       Name Relation Home Work Mobile    [place] Spouse [phone]   [phone]         Current Medical History  Patient Admitting Diagnosis: pseudomeningocele repair   History of Present Illness:  62 year old right-handed  male with history significant for rheumatoid arthritis maintained on methotrexate, CAD maintained on low-dose aspirin, OSA with CPAP intolerance, quit smoking 14 years ago, chronic systolic congestive heart failure, 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 Kathyrn Sheriff and discharged home 07/25/2022. Patient presented on 07/28/22 with progressive slurred speech gait abnormality and headache.     CT/MRI showed new small focus of acute infarction in the right superior cerebellar hemisphere with surrounding area of increased T2 signal and cystic change new since prior comparison 07/05/2022.  CT angiogram  head and neck no vascular lesions seen underlying the cerebellar infarct.  Echocardiogram with ejection fraction of 40 to 45% left ventricle demonstrating global hypokinesis with grade 1 diastolic dysfunction.  Neurology Dr. Leonie Man as well as neurosurgery follow-up Dr. Duffy Rhody follow-up currently maintained on low-dose aspirin.  No current plan for neurosurgical intervention.  Lovenox added for DVT prophylaxis.  Neurology as well as neurosurgery felt that small DWI hyperintensity identified on imaging not felt to be typical of stroke and more likely to be postoperative change.  He was started on steroids with dexamethasone taper.  He is currently on a regular diet with nectar thick liquid diet.     Patient's [facility] has been reviewed by the rehabilitation admission coordinator and physician.   Past Medical History      Past Medical History:  Diagnosis Date   Anxiety     Arthritis, rheumatoid ([place])      dx 1988   CAD (coronary artery disease), native coronary artery      Mild LAD disease with calcification noted at cath 12//27/16    Cardiomyopathy ([place])     Cardiomyopathy ([place])     Chronic systolic heart failure ([place]) 05/01/2015   Depression     Deviated nasal septum 06/25/2011   Headache     High cholesterol      Hyperlipidemia     Impingement syndrome of left shoulder 12/22/2017   Macular degeneration     Rheumatoid aortitis     Sleep apnea      does not use cpap - UNABLE TO TOLERATE MASK   Sleep apnea in adult      deviated septum repaired, most recent sleep study was negative   Status post total right knee replacement 06/01/2015    Has the patient had major surgery during 100 days prior to admission? Yes   Family History  family history includes Lupus in his sister.     Current Medications    Current Facility-Administered Medications:    acetaminophen (TYLENOL) tablet 650 mg, 650 mg, Oral, Q4H PRN, 650 mg at 07/29/22 1058 **OR** acetaminophen (TYLENOL) 160 MG/5ML solution 650 mg, 650 mg, Per Tube, Q4H PRN **OR** acetaminophen (TYLENOL) suppository 650 mg, 650 mg, Rectal, Q4H PRN, Opyd, Ilene Qua, MD   aspirin EC tablet 81 mg, 81 mg, Oral, Daily, Opyd, Ilene Qua, MD, 81 mg at 07/30/22 1106   dexamethasone (DECADRON) injection 4 mg, 4 mg, Intravenous, Q8H, Lavina Hamman, MD, 4 mg at 07/31/22 [id number]   diazepam (VALIUM) tablet 5 mg, 5 mg, Oral, Q8H PRN, Opyd, Timothy S, MD, 5 mg at 07/29/22 1058   enoxaparin (LOVENOX) injection 40 mg, 40 mg, Subcutaneous, Q24H, Opyd, Ilene Qua, MD, 40 mg at 07/30/22 2055   rosuvastatin (CRESTOR) tablet 20 mg, 20 mg, Oral, Daily, Opyd, Timothy S, MD, 20 mg at 07/30/22 1106   senna-docusate (Senokot-S) tablet 1 tablet, 1 tablet, Oral, QHS PRN, Opyd, Ilene Qua, MD   sertraline (ZOLOFT) tablet 50 mg, 50 mg, Oral, q morning, Opyd, Ilene Qua, MD, 50 mg at 07/30/22 1106   Patients Current Diet:  Diet Order                  Diet regular Room service appropriate? No; Fluid consistency: Nectar Thick  Diet effective now                         Precautions / Restrictions Precautions Precautions: Fall Restrictions Weight  Bearing Restrictions: No    Has the patient had 2 or more falls or a fall with injury in the past year?Yes   Prior Activity  Level Community (5-7x/wk): was independent and working prior to surgery 12/23   Prior Functional Level Prior Function Prior Level of Function : Needs assist Mobility Comments: independent with mobility prior to initial surgery in december. Has been ambulating with RW since surgery, multiple falls. Was ambulating with RW and assistance from spouse at all times since recent discharge on 3/22. ADLs Comments: assistance with all ADLs since discharge last week. Independent prior to initial surgery in December   Self Care: Did the patient need help bathing, dressing, using the toilet or eating?  Independent   Indoor Mobility: Did the patient need assistance with walking from room to room (with or without device)? Independent   Stairs: Did the patient need assistance with internal or external stairs (with or without device)? Independent   Functional Cognition: Did the patient need help planning regular tasks such as shopping or remembering to take medications? Independent   Patient Information Are you of Hispanic, Latino/a,or Spanish origin?: A. No, not of Hispanic, Latino/a, or Spanish origin What is your race?: C. American [place] or [place] Native ([place]) Do you need or want an interpreter to communicate with a doctor or health care staff?: 0. No   Patient's Response To:  Health Literacy and Transportation Is the patient able to respond to health literacy and transportation needs?: Yes Health Literacy - How often do you need to have someone help you when you read instructions, pamphlets, or other written material from your doctor or pharmacy?: Never In the past 12 months, has lack of transportation kept you from medical appointments or from getting medications?: No In the past 12 months, has lack of transportation kept you from meetings, work, or from getting things needed for daily living?: No   Home Assistive Devices / [place] Devices/Equipment:  Environmental consultant (specify type), Shower chair with back, Transfer belt Home Equipment: Conservation officer, nature (2 wheels), Shower seat, Cane - single point, Grab bars - toilet   Prior Device Use: Indicate devices/aids used by the patient prior to current illness, exacerbation or injury? None of the above recently using RW since last surgery and d/c 3/22   Current Functional Level Cognition   Arousal/Alertness: Awake/alert Overall Cognitive Status: Impaired/Different from baseline Orientation Level: Oriented to person, Oriented to place, Oriented to situation (oriented to year, not day of week) Attention: Sustained Sustained Attention: Appears intact Memory: Appears intact (recalled 5/5 words) Awareness: Impaired Awareness Impairment: Intellectual impairment, Anticipatory impairment Problem Solving: Impaired Problem Solving Impairment: Verbal basic Safety/Judgment: Impaired    Extremity Assessment (includes Sensation/Coordination)   Upper Extremity Assessment: RUE deficits/detail, LUE deficits/detail RUE Deficits / Details: ROM WFL, Dysmetria present RUE Coordination: decreased gross motor (undershoots finger to nose) LUE Deficits / Details: ROM WFL, Dysmetria present LUE Coordination: decreased gross motor (undershoots finger to nose)  Lower Extremity Assessment: Generalized weakness     ADLs   Overall ADL's : Needs assistance/impaired Eating/Feeding: Independent, Sitting Grooming: Sitting, Min guard Upper Body Bathing: Min guard, Sitting Upper Body Bathing Details (indicate cue type and reason): Pt spouse reports sponge bathing recently PTA Lower Body Bathing: Sitting/lateral leans, Moderate assistance Lower Body Bathing Details (indicate cue type and reason): Pt spouse reports sponge bathing recently PTA Upper Body Dressing : Min guard, Sitting Lower Body Dressing: Moderate assistance, Sitting/lateral leans Toilet Transfer: Rolling walker (2 wheels), Ambulation,  Moderate assistance Toileting-  Clothing Manipulation and Hygiene: Moderate assistance, Sit to/from stand Tub/ Shower Transfer: Moderate assistance, Rolling walker (2 wheels), Ambulation Functional mobility during ADLs: Rolling walker (2 wheels), Min guard General ADL Comments: Lateral stepping at bedside     Mobility   Overal bed mobility: Needs Assistance Bed Mobility: Supine to Sit, Sit to Supine Supine to sit: Supervision Sit to supine: Supervision General bed mobility comments: No physical assist required     Transfers   Overall transfer level: Needs assistance Equipment used: Rolling walker (2 wheels) Transfers: Sit to/from Stand Sit to Stand: Min assist General transfer comment: Increased time to rise, light minA to power up to walker     Ambulation / Gait / Stairs / Wheelchair Mobility   Ambulation/Gait Ambulation/Gait assistance: Min assist, Mod assist Gait Distance (Feet): 100 Feet Assistive device: Rolling walker (2 wheels) Gait Pattern/deviations: Decreased step length - left, Ataxic, Trunk flexed, Step-to pattern General Gait Details: Verbal cues for increased L step length, upright posture, environmental/obstacle negotiation. Pt requiring min assist overall for balance, regressing to modA with fatigue and increased L lateral lean in standing Gait velocity: reduced Gait velocity interpretation: <1.31 ft/sec, indicative of household ambulator     Posture / Balance Dynamic Sitting Balance Sitting balance - Comments: L lean, able to correct and return to midline with visual cues Balance Overall balance assessment: Needs assistance Sitting-balance support: Feet supported Sitting balance-Leahy Scale: Fair Sitting balance - Comments: L lean, able to correct and return to midline with visual cues Postural control: Left lateral lean Standing balance support: No upper extremity supported, During functional activity Standing balance-Leahy Scale: Poor Standing balance comment: Requiring min guard when  brushing teeth at sink     Special needs/care consideration Fall precautions        Previous Home Environment Living Arrangements: Spouse/significant other, Children  Lives With: Spouse Available Help at Discharge: Available 24 hours/day (I asked wife to take FMLA for 24/7 supervision after CIR) Type of Home: House Home Layout: Two level, Able to live on main level with bedroom/bathroom Alternate Level Stairs-Number of Steps: flight Home Access: Stairs to enter Entrance Stairs-Rails: Right Entrance Stairs-Number of Steps: 3 Bathroom Shower/Tub: Multimedia programmer: Handicapped height Bathroom Accessibility: Yes Home Care Services: No   Discharge Living Setting Plans for Discharge Living Setting: Patient's home, Lives with (comment) (wife) Type of Home at Discharge: House Discharge Home Layout: Two level, Able to live on main level with bedroom/bathroom Alternate Level Stairs-Number of Steps: flight Discharge Home Access: Stairs to enter Entrance Stairs-Rails: Right Entrance Stairs-Number of Steps: 3 Discharge Bathroom Shower/Tub: Walk-in shower Discharge Bathroom Toilet: Handicapped height Discharge Bathroom Accessibility: Yes How Accessible: Accessible via walker Does the patient have any problems obtaining your medications?: No   Social/Family/Support Systems Patient Roles: Spouse, Parent (employee) Contact Information: wife, Kathlee [place] Anticipated Caregiver: wife Anticipated Ambulance person Information: see contacts Ability/Limitations of Caregiver: wife works but can take [employer] if needed Caregiver Availability: 24/7 Discharge Plan Discussed with Primary Caregiver: Yes Is Caregiver In Agreement with Plan?: Yes Does Caregiver/Family have Issues with Lodging/Transportation while Pt is in Rehab?: No   Goals Patient/Family Goal for Rehab: Mod I with PT, OT and SLP Expected length of stay: ELOS 7 to 10 days Pt/Family Agrees to Admission and willing to  participate: Yes Program Orientation Provided & Reviewed with Pt/Caregiver Including Roles  & Responsibilities: Yes   Decrease burden of Care through IP rehab admission: n/a   Possible need for SNF placement upon  discharge:not anticipated   Patient Condition: This patient's condition remains as documented in the consult dated 07/30/22, in which the Rehabilitation Physician determined and documented that the patient's condition is appropriate for intensive rehabilitative care in an inpatient rehabilitation facility. Will admit to inpatient rehab today.   Preadmission Screen Completed By:  Cleatrice Burke, RN, MSN 07/31/2022 11:21 AM ______________________________________________________________________   Discussed status with Dr. Curlene Dolphin on 07/31/22 at 1123 and received approval for admission today.   Admission Coordinator:  Cleatrice Burke, time [id number] Date 07/31/22            Revision History

## 2022-07-31 NOTE — Progress Notes (Signed)
PT Cancellation Note  Patient Details Name: Christopher Yu MRN: [id number] DOB: [birth year]   Cancelled Treatment:    Reason Eval/Treat Not Completed: (P) Fatigue/lethargy limiting ability to participate. Pt reporting he just got back to bed and was comfortable, requesting PT follow-up later as time permits.   Moishe Spice, PT, DPT Acute Rehabilitation Services  Office: [place] 07/31/2022, 1:26 PM

## 2022-07-31 NOTE — Progress Notes (Signed)
Patient ID: Christopher Yu, male   DOB: 1960/08/10, 62 y.o.   MRN: [id number] Met with the patient, and family to review current situation, rehab process, team conference and plan of care. Reviewed medications and dietary modifications including nectar thickened liquids.  Continue to follow along to address educational needs to facilitate preparation for discharge. Margarito Liner

## 2022-08-01 DIAGNOSIS — G96198 Other disorders of meninges, not elsewhere classified: Secondary | ICD-10-CM | POA: Diagnosis not present

## 2022-08-01 LAB — CBC WITH DIFFERENTIAL/PLATELET
Abs Immature Granulocytes: 0.05 10*3/uL (ref 0.00–0.07)
Basophils Absolute: 0 10*3/uL (ref 0.0–0.1)
Basophils Relative: 0 %
Eosinophils Absolute: 0 10*3/uL (ref 0.0–0.5)
Eosinophils Relative: 0 %
HCT: 32.7 % — ABNORMAL LOW (ref 39.0–52.0)
Hemoglobin: 10.6 g/dL — ABNORMAL LOW (ref 13.0–17.0)
Immature Granulocytes: 1 %
Lymphocytes Relative: 6 %
Lymphs Abs: 0.7 10*3/uL (ref 0.7–4.0)
MCH: 23.6 pg — ABNORMAL LOW (ref 26.0–34.0)
MCHC: 32.4 g/dL (ref 30.0–36.0)
MCV: 72.8 fL — ABNORMAL LOW (ref 80.0–100.0)
Monocytes Absolute: 0.3 10*3/uL (ref 0.1–1.0)
Monocytes Relative: 3 %
Neutro Abs: 10 10*3/uL — ABNORMAL HIGH (ref 1.7–7.7)
Neutrophils Relative %: 90 %
Platelets: 241 10*3/uL (ref 150–400)
RBC: 4.49 MIL/uL (ref 4.22–5.81)
RDW: 19.5 % — ABNORMAL HIGH (ref 11.5–15.5)
WBC: 11 10*3/uL — ABNORMAL HIGH (ref 4.0–10.5)
nRBC: 0 % (ref 0.0–0.2)

## 2022-08-01 LAB — COMPREHENSIVE METABOLIC PANEL
ALT: 15 U/L (ref 0–44)
AST: 19 U/L (ref 15–41)
Albumin: 3.7 g/dL (ref 3.5–5.0)
Alkaline Phosphatase: 57 U/L (ref 38–126)
Anion gap: 12 (ref 5–15)
BUN: 29 mg/dL — ABNORMAL HIGH (ref 6–20)
CO2: 26 mmol/L (ref 22–32)
Calcium: 9.1 mg/dL (ref 8.9–10.3)
Chloride: 102 mmol/L (ref 98–111)
Creatinine, Ser: 0.93 mg/dL (ref 0.61–1.24)
GFR, Estimated: >60 mL/min (ref >60)
Glucose, Bld: 149 mg/dL — ABNORMAL HIGH (ref 70–99)
Potassium: 3.5 mmol/L (ref 3.5–5.1)
Sodium: 140 mmol/L (ref 135–145)
Total Bilirubin: 0.4 mg/dL (ref 0.3–1.2)
Total Protein: 6.8 g/dL (ref 6.5–8.1)

## 2022-08-01 MED ORDER — ACETAMINOPHEN 500 MG PO TABS
1000.0000 mg | ORAL_TABLET | Freq: Three times a day (TID) | ORAL | Status: DC
  Administered 2022-08-01 – 2022-08-02 (×3): 1000 mg via ORAL
  Filled 2022-08-01 (×3): qty 2

## 2022-08-01 MED ORDER — MELATONIN 5 MG PO TABS: 5.0000 mg | ORAL_TABLET | Freq: Every evening | ORAL | Status: DC | PRN

## 2022-08-01 MED ORDER — GABAPENTIN 300 MG PO CAPS
300.0000 mg | ORAL_CAPSULE | Freq: Two times a day (BID) | ORAL | Status: DC | PRN
  Administered 2022-08-01 – 2022-08-02 (×2): 300 mg via ORAL
  Filled 2022-08-01 (×2): qty 1

## 2022-08-01 MED ORDER — DICLOFENAC SODIUM 1 % EX GEL
2.0000 g | Freq: Four times a day (QID) | CUTANEOUS | Status: DC
  Administered 2022-08-01 – 2022-08-25 (×53): 2 g via TOPICAL
  Filled 2022-08-01: qty 100

## 2022-08-01 MED ORDER — GABAPENTIN 300 MG PO CAPS: 300.0000 mg | ORAL_CAPSULE | Freq: Every day | ORAL | Status: DC

## 2022-08-01 NOTE — Progress Notes (Signed)
PROGRESS NOTE   Subjective/Complaints:  No events overnihgt. Complaints of headaches radiating from surgical site on back of neck, ongoing, unchanged. Also c/o poor sleep due to headaches and inability to calm down.   ROS: +Has, +insomnia. Denies fevers, chills, N/V, abdominal pain, constipation, diarrhea, SOB, cough, chest pain, new weakness or paraesthesias.    Objective:   No results found. Recent Labs    07/31/22 0640 08/01/22 0917  WBC 10.2 11.0*  HGB 10.4* 10.6*  HCT 31.7* 32.7*  PLT 241 241   Recent Labs    07/31/22 0640 08/01/22 0917  NA 140 140  K 3.5 3.5  CL 102 102  CO2 28 26  GLUCOSE 125* 149*  BUN 16 29*  CREATININE 0.82 0.93  CALCIUM 9.1 9.1    Intake/Output Summary (Last 24 hours) at 08/01/2022 1317 Last data filed at 07/31/2022 1830 Gross per 24 hour  Intake 120 ml  Output --  Net 120 ml        Physical Exam: Vital Signs Blood pressure 113/66, pulse 85, temperature 97.7 F (36.5 C), resp. rate 15, height 6\' 1"  (1.854 m), weight 90 kg, SpO2 94 %.     General: NAD, lying in bed HEENT: Head with occipital swelling noted, fluctuant, nontedner, no warmth. , PERRLA, EOMI, sclera anicteric, oral mucosa pink and moist  Neck: Supple without JVD or lymphadenopathy Heart: Reg rate and rhythm. No murmurs rubs or gallops Chest: CTA bilaterally without wheezes, rales, or rhonchi; no distress Abdomen: Soft, non-tender, non-distended, bowel sounds positive. Extremities: No clubbing, cyanosis, or edema. Pulses are 2+ Psych: Pt's affect is appropriate. Pt is cooperative Skin: Clean and intact without signs of breakdown Neuro: Alert and oriented x 4, Strength 5 out of 5 in bilateral upper extremities Strength 4+ out of 5 in proximal lower extremities, 5 out of 5 in distal lower extremities Sensation intact light touch in all 4 extremities + Speech apraxia and mild word confusion +dysarthria + FTN  slightly altered L>R HTS b/l slightly slow to complete Musculoskeletal: Normal bulk and tone, no joint swelling noted Healed right TKA incision  Assessment/Plan: 1. Functional deficits which require 3+ hours per day of interdisciplinary therapy in a comprehensive inpatient rehab setting. Physiatrist is providing close team supervision and 24 hour management of active medical problems listed below. Physiatrist and rehab team continue to assess barriers to discharge/monitor patient progress toward functional and medical goals  Care Tool:  Bathing              Bathing assist       Upper Body Dressing/Undressing Upper body dressing        Upper body assist      Lower Body Dressing/Undressing Lower body dressing            Lower body assist       Toileting Toileting    Toileting assist       Transfers Chair/bed transfer  Transfers assist     Chair/bed transfer assist level: Minimal Assistance - Patient > 75%     Locomotion Ambulation   Ambulation assist      Assist level: Minimal Assistance - Patient > 75% Assistive device: Walker-rolling  Max distance: 150'   Walk 10 feet activity   Assist     Assist level: Moderate Assistance - Patient - 50 - 74% Assistive device: Hand held assist   Walk 50 feet activity   Assist    Assist level: Minimal Assistance - Patient > 75% Assistive device: Walker-rolling    Walk 150 feet activity   Assist    Assist level: Minimal Assistance - Patient > 75% Assistive device: Walker-rolling    Walk 10 feet on uneven surface  activity   Assist     Assist level: Minimal Assistance - Patient > 75% Assistive device: Walker-rolling   Wheelchair     Assist Is the patient using a wheelchair?: Yes Type of Wheelchair: Manual    Wheelchair assist level: Minimal Assistance - Patient > 75% Max wheelchair distance: 150'    Wheelchair 50 feet with 2 turns activity    Assist        Assist  Level: Minimal Assistance - Patient > 75%   Wheelchair 150 feet activity     Assist      Assist Level: Minimal Assistance - Patient > 75%   Blood pressure 113/66, pulse 85, temperature 97.7 F (36.5 C), resp. rate 15, height 6\' 1"  (1.854 m), weight 90 kg, SpO2 94 %.  1. Functional deficits secondary to postoperative changes from recent pseudomeningocele repair with worsening ataxia, dysarthria and dysphagia as well as history of AVM resection December 2023..            -patient may shower             -ELOS/Goals: 7-10 days, Mod I PT/OT/SLP             -Admit to CIR -   Decadron as indicated 4 MG Q8 HOURS with TAPER STARTING 3/29 to 4/10  2.  Antithrombotics: -DVT/anticoagulation:  Pharmaceutical: Lovenox             -antiplatelet therapy: Aspirin 81 mg daily 3. Pain Management: Tylenol as needed, Valium as needed muscle spasms   - 3/29: Schedule Tylenol 100 mg TID, add gabapentin 300 mg QHS for headache and sleep  4. Mood/Behavior/Sleep: Zoloft 50 mg daily             -antipsychotic agents: N/A   -3/29: Gabapentin as above ,melatonin PRN  5. Neuropsych/cognition: This patient is capable of making decisions on his own behalf. 6. Skin/Wound Care: Routine skin checks 7. Dysphagia/Fluids/Electrolytes/Nutrition: Routine in and outs with follow-up chemistries   - 3/29: Cr stable, BUN increased, encourage PO fluids and re-test Monday   - SLP eval pending; thickened liquids will make hydration challenging   8.  Hyperlipidemia.  Crestor 20mg  daily 9.  Rheumatoid arthritis.  Consider resuming home regimen of rituximab, methotrexate and sulfasalazine after steroids completed (4/10).  Currently on steroids.   10.  CAD.  Continue low-dose aspirin. Denies CP 11.  Dysphagia.Regular diet with nectar liquids.Follow up SLP 12.  Chronic systolic congestive heart failure.  Monitor for any signs of fluid overload. Daily weight  Filed Weights   07/31/22 1430 08/01/22 0500  Weight: 91.1 kg 90  kg    - stable, monitor  13.  OSA.Intolerance to CPAP. Counseling on CPAP benefits.  14. Prediabetes. A1C 6.0. Monitor on CBC/CMP 15. Leukocytosis. [place] due to steroids. Stable. Monitor for s/s infection.   LOS: 1 days A FACE TO [place] 08/01/2022, 1:17 PM

## 2022-08-01 NOTE — Progress Notes (Signed)
Patient family request evaluation of right wrist small bilateral bruising and pain with lateral movement

## 2022-08-01 NOTE — Progress Notes (Signed)
Occupational Therapy Assessment and Plan  Patient Details  Name: Christopher Yu MRN: [id number] Date of Birth: January 17, 1961  OT Diagnosis: ataxia, cognitive deficits, hemiplegia affecting non-dominant side, and muscle weakness (generalized) Rehab Potential: Rehab Potential (ACUTE ONLY): Excellent ELOS: 14-18 days   Today's Date: 08/01/2022 OT Individual Time: 1300-1415 OT Individual Time Calculation (min): 75 min     Hospital Problem: Principal Problem:   Pseudomeningocele   Past Medical History:  Past Medical History:  Diagnosis Date   Anxiety    Arthritis, rheumatoid ([place])    dx 1988   CAD (coronary artery disease), native coronary artery    Mild LAD disease with calcification noted at cath 12//27/16    Cardiomyopathy ([place])    Cardiomyopathy ([place])    Chronic systolic heart failure ([place]) 05/01/2015   Depression    Deviated nasal septum 06/25/2011   Headache    High cholesterol    Hyperlipidemia    Impingement syndrome of left shoulder 12/22/2017   Macular degeneration    Rheumatoid aortitis    Sleep apnea    does not use cpap - UNABLE TO TOLERATE MASK   Sleep apnea in adult    deviated septum repaired, most recent sleep study was negative   Status post total right knee replacement 06/01/2015   Past Surgical History:  Past Surgical History:  Procedure Laterality Date   ANKLE FUSION Right 05/05/2012   related to his arthritis   ANKLE FUSION Left 05/25/2013   related to his arthritis   ANKLE SURGERY Bilateral    APPLICATION OF CRANIAL NAVIGATION N/A 04/11/2022   Procedure: APPLICATION OF CRANIAL NAVIGATION;  Surgeon: Consuella Lose, MD;  Location: [facility];  Service: Neurosurgery;  Laterality: N/A;   CARDIAC CATHETERIZATION N/A 05/01/2015   Procedure: Left Heart Cath and Coronary Angiography;  Surgeon: Belva Crome, MD;  Location: [facility];  Service: Cardiovascular;  Laterality: N/A;   CRANIOTOMY N/A 04/11/2022   Procedure: STEREOTACTIC  SUBOCCIPITAL CRANIOTOMY FOR RESECTION OF ARTERIO-VENOUS MALFORMATION;  Surgeon: Consuella Lose, MD;  Location: [facility];  Service: Neurosurgery;  Laterality: N/A;   FRACTURE SURGERY     left femur fracture x 3   IR ANGIO EXTERNAL CAROTID SEL EXT CAROTID BILAT MOD SED  01/28/2022   IR ANGIO INTRA EXTRACRAN SEL INTERNAL CAROTID BILAT MOD SED  01/28/2022   IR ANGIO INTRA EXTRACRAN SEL INTERNAL CAROTID BILAT MOD SED  04/16/2022   IR ANGIO VERTEBRAL SEL VERTEBRAL BILAT MOD SED  01/28/2022   IR ANGIO VERTEBRAL SEL VERTEBRAL BILAT MOD SED  04/16/2022   KNEE ARTHROSCOPY     right x 4   LUMBAR LAMINECTOMY/DECOMPRESSION MICRODISCECTOMY N/A 07/18/2022   Procedure: Repair of Pseudomeningiocele posterior;  Surgeon: Consuella Lose, MD;  Location: [facility];  Service: Neurosurgery;  Laterality: N/A;   NASAL SEPTOPLASTY W/ TURBINOPLASTY  06/25/2011   Procedure: NASAL SEPTOPLASTY WITH TURBINATE REDUCTION;  Surgeon: Jerrell Belfast, MD;  Location: [facility];  Service: ENT;  Laterality: Bilateral;   PLACEMENT OF LUMBAR DRAIN N/A 07/18/2022   Procedure: PLACEMENT OF LUMBAR DRAIN;  Surgeon: Consuella Lose, MD;  Location: [facility];  Service: Neurosurgery;  Laterality: N/A;   TONSILLECTOMY     TOTAL KNEE ARTHROPLASTY Right 06/01/2015   Procedure: RIGHT TOTAL KNEE ARTHROPLASTY;  Surgeon: Mcarthur Rossetti, MD;  Location: [facility];  Service: Orthopedics;  Laterality: Right;  Block+general    Assessment & Plan Clinical Impression: Patient is a 62 y.o. year old male with recent admission to the hospital on 07/28/2022 with  slurred speech, worsening gait and HA. Pt recently discharged 07/25/2022 after operative repair of pseudomeningiocele. MRI brain 3/25 demonstrates acute R cerebellar infarct. PMH: for CAD, R TKA, cardiomyopathy, chronic systolic heart failure, macular degeneration. Patient transferred to CIR on 07/31/2022 .    Patient currently requiresmin/ mod with basic self-care skills secondary to muscle weakness,  decreased cardiorespiratoy endurance, impaired timing and sequencing, unbalanced muscle activation, ataxia, and decreased coordination, decreased awareness and decreased safety awareness, and decreased standing balance, decreased postural control, hemiplegia, and decreased balance strategies.  Prior to hospitalization, patient could complete BADL with modified independent .  Patient will benefit from skilled intervention to increase independence with basic self-care skills prior to discharge home with care partner.  Anticipate patient will require 24 hour supervision and follow up outpatient.  OT - End of Session Endurance Deficit: Yes Endurance Deficit Description: Rest breaks within BADL tasks OT Assessment Rehab Potential (ACUTE ONLY): Excellent OT Patient demonstrates impairments in the following area(s): Balance;Cognition;Endurance;Motor;Perception;Safety;Vision;Behavior;Pain OT Basic ADL's Functional Problem(s): Eating;Bathing;Grooming;Toileting;Dressing OT Transfers Functional Problem(s): Toilet;Tub/Shower OT Additional Impairment(s): Fuctional Use of Upper Extremity OT Plan OT Intensity: Minimum of 1-2 x/day, 45 to 90 minutes OT Frequency: 5 out of 7 days OT Duration/Estimated Length of Stay: 14-18 days OT Treatment/Interventions: Balance/vestibular training;Cognitive remediation/compensation;Community reintegration;Discharge planning;Disease mangement/prevention;DME/adaptive equipment instruction;Functional electrical stimulation;Functional mobility training;Neuromuscular re-education;Pain management;Patient/family education;Psychosocial support;Self Care/advanced ADL retraining;Skin care/wound managment;Splinting/orthotics;Therapeutic Activities;Therapeutic Exercise;UE/LE Strength taining/ROM;UE/LE Coordination activities;Visual/perceptual remediation/compensation;Wheelchair propulsion/positioning OT Self Feeding Anticipated Outcome(s): mod I OT Basic Self-Care Anticipated Outcome(s):  mod I/supervision OT Toileting Anticipated Outcome(s): supervision OT Bathroom Transfers Anticipated Outcome(s): supevrision OT Recommendation Recommendations for Other Services: Therapeutic Recreation consult Therapeutic Recreation Interventions: Pet therapy;Outing/community reintergration Patient destination: Home Follow Up Recommendations: Outpatient OT Equipment Recommended: To be determined Equipment Details: will likely need a shower seat   OT Evaluation Precautions/Restrictions  Precautions Precautions: Fall Restrictions Weight Bearing Restrictions: No Other Position/Activity Restrictions: Ataxia, impulsive, impaired problem-solving Pain  Denies pain Home Living/Prior [place] expects to be discharged to:: Private residence Living Arrangements: Spouse/significant other Available Help at Discharge: Available 24 hours/day, Available PRN/intermittently Type of Home: House Home Access: Stairs to enter [employer] of Steps: 3 Entrance Stairs-Rails: Right Home Layout: Two level, Able to live on main level with bedroom/bathroom Alternate Level Stairs-Number of Steps: Full flight of stairs- Patient's office is on the second floor Alternate Level Stairs-Rails: Right Bathroom Shower/Tub: Walk-in shower Bathroom Toilet: Handicapped height Bathroom Accessibility: No Additional Comments: Difficulty getting RW into water closet where toilet is, would get to main bathroom where shower is.  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 Has not been back to work since then. He was active and independent prior to decemeber. Driving, golfing, working. 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 Leisure: Hobbies-yes (Comment) (Golf) Vision Baseline Vision/History: 1 Wears glasses Ability to See in Adequate Light: 0 Adequate Patient Visual Report: No change from baseline Vision Assessment?: Yes Eye Alignment: Within Functional Limits Ocular Range of Motion: Within Functional Limits Tracking/Visual Pursuits: Decreased smoothness of horizontal tracking;Decreased smoothness of vertical tracking;Decreased smoothness of eye movement to RIGHT inferior field;Decreased smoothness of eye movement to LEFT inferior field;Decreased smoothness of eye movement to RIGHT superior field;Decreased  smoothness of eye movement to LEFT superior field Depth Perception: Undershoots Additional Comments: R undershoots Perception  Perception: Impaired Spatial Orientation: Patient with R lateral bias with functional mobility Praxis Praxis: Impaired Praxis Impairment Details: Motor planning Praxis-Other Comments:  (Impaired coordination, worse on the L than R) Cognition Cognition Overall Cognitive Status: Impaired/Different from baseline Arousal/Alertness: Awake/alert Memory: Appears intact Attention: Sustained Sustained Attention: Appears intact Awareness: Impaired Awareness Impairment: Intellectual impairment;Anticipatory impairment Problem Solving: Impaired Problem Solving Impairment: Verbal basic Safety/Judgment: Impaired Brief Interview for Mental Status (BIMS) Repetition of Three Words (First Attempt): 3 Temporal Orientation: Year: Correct Temporal Orientation: Month: Accurate within 5 days Temporal Orientation: Day: Correct Recall: "Sock": Yes, no cue required Recall: "Blue": Yes, no cue required Recall: "Bed": Yes, after cueing ("a piece of furniture") BIMS Summary Score: 14 Sensation Sensation Light Touch: Appears Intact Coordination Gross Motor Movements are Fluid and Coordinated:  No Fine Motor Movements are Fluid and Coordinated: No Coordination and Movement Description: Ataxic movement patterns Finger Nose Finger Test: Undershoots with B UE (L>R) 9 Hole Peg Test: R-52 L- 62 Motor  Motor Motor: Ataxia Motor - Skilled Clinical Observations: Ataxic movements patterns with impaired coordination  Trunk/Postural Assessment  Cervical Assessment Cervical Assessment: Within Functional Limits Thoracic Assessment Thoracic Assessment: Within Functional Limits Lumbar Assessment Lumbar Assessment: Within Functional Limits Postural Control Postural Control: Deficits on evaluation Righting Reactions: Delayed and inadequate  Balance Balance Balance Assessed: Yes Static Sitting Balance Static Sitting - Balance Support: No upper extremity supported;Feet supported Static Sitting - Level of Assistance: 5: Stand by assistance Dynamic Sitting Balance Dynamic Sitting - Balance Support: During functional activity;Feet supported Dynamic Sitting - Level of Assistance: 5: Stand by assistance Sitting balance - Comments: L lean, able to correct and return to midline with visual cues Static Standing Balance Static Standing - Balance Support: During functional activity;Bilateral upper extremity supported Static Standing - Level of Assistance: 5: Stand by assistance;4: Min assist Dynamic Standing Balance Dynamic Standing - Balance Support: During functional activity;Bilateral upper extremity supported Dynamic Standing - Level of Assistance: 4: Min assist;3: Mod assist Extremity/Trunk Assessment RUE Assessment RUE Assessment: Within Functional Limits General Strength Comments: strength is 5/5-coordination deficits LUE Assessment LUE Assessment: Within Functional Limits General Strength Comments: strength 5/5-coordination deficits worse on L than R  Care Tool Care Tool Self Care Eating   Eating Assist Level: Supervision/Verbal cueing    Oral Care    Oral Care Assist Level:  Minimal Assistance - Patient > 75%    Bathing   Body parts bathed by patient: Right arm;Left arm;Chest;Abdomen;Front perineal area;Right upper leg;Buttocks;Left upper leg;Right lower leg;Left lower leg;Face     Assist Level: Moderate Assistance - Patient 50 - 74%    Upper Body Dressing(including orthotics)   What is the patient wearing?: Pull over shirt   Assist Level: Minimal Assistance - Patient > 75%    Lower Body Dressing (excluding footwear)   What is the patient wearing?: Pants;Underwear/pull up Assist for lower body dressing: Moderate Assistance - Patient 50 - 74%    Putting on/Taking off footwear   What is the patient wearing?: Non-skid slipper socks Assist for footwear: Moderate Assistance - Patient 50 - 74%       Care Tool Toileting Toileting activity   Assist for toileting: Minimal Assistance - Patient > 75%     Care Tool Bed Mobility Roll left and right activity   Roll left and right assist level: Supervision/Verbal cueing    Sit to lying activity   Sit to lying  assist level: Supervision/Verbal cueing    Lying to sitting on side of bed activity   Lying to sitting on side of bed assist level: the ability to move from lying on the back to sitting on the side of the bed with no back support.: Supervision/Verbal cueing     Care Tool Transfers Sit to stand transfer   Sit to stand assist level: Minimal Assistance - Patient > 75%    Chair/bed transfer   Chair/bed transfer assist level: Minimal Assistance - Patient > 75%     Toilet transfer   Assist Level: Minimal Assistance - Patient > 75%     Care Tool Cognition  Expression of Ideas and Wants Expression of Ideas and Wants: 3. Some difficulty - exhibits some difficulty with expressing needs and ideas (e.g, some words or finishing thoughts) or speech is not clear  Understanding Verbal and Non-Verbal Content Understanding Verbal and Non-Verbal Content: 4. Understands (complex and basic) - clear comprehension  without cues or repetitions   Memory/Recall Ability Memory/Recall Ability : Current season;Location of own room;Staff names and faces;That he or she is in a hospital/hospital unit   Refer to Care Plan for [place] 1 OT Short Term Goal 1 (Week 1): Patient will maintain standing balance at the sink with CGA within functional BADL task OT Short Term Goal 2 (Week 1): Patient will complete LB dressing with min A OT Short Term Goal 3 (Week 1): Patient will tolerate standing for 3 minutes in preparation for BADL tasks  Recommendations for other services: Therapeutic Recreation  Pet therapy and Outing/community reintegration   Skilled Therapeutic Intervention ADL ADL Eating: Set up;Supervision/safety Grooming: Minimal assistance Upper Body Bathing: Minimal assistance Lower Body Bathing: Minimal assistance Upper Body Dressing: Minimal assistance Lower Body Dressing: Moderate assistance Toileting: Minimal assistance Toilet Transfer: Minimal assistance Social research officer, government: Minimal assistance ADL Comments: Has RW, has raised seat with handles over commode Mobility  Bed Mobility Bed Mobility: Rolling Right;Rolling Left;Supine to Sit;Sit to Supine Rolling Right: Supervision/verbal cueing Rolling Left: Supervision/Verbal cueing Supine to Sit: Supervision/Verbal cueing Sit to Supine: Supervision/Verbal cueing Transfers Sit to Stand: Minimal Assistance - Patient > 75% Stand to Sit: Minimal Assistance - Patient > 75%   Discharge Criteria: Patient will be discharged from OT if patient refuses treatment 3 consecutive times without medical reason, if treatment goals not met, if there is a change in medical status, if patient makes no progress towards goals or if patient is discharged from hospital.  The above assessment, treatment plan, treatment alternatives and goals were discussed and mutually agreed upon: by patient and by family  Valma Cava 08/01/2022,  3:31 PM

## 2022-08-01 NOTE — Progress Notes (Signed)
Inpatient Rehabilitation  Patient information reviewed and entered into eRehab system by Amritha Yorke M. Nariah Morgano, M.A., CCC/SLP, PPS Coordinator.  Information including medical coding, functional ability and quality indicators will be reviewed and updated through discharge.    

## 2022-08-01 NOTE — Progress Notes (Signed)
Patient ID: Christopher Yu, male   DOB: 1960-11-14, 62 y.o.   MRN: [id number]  SW went by room to complete assessment, however, pt in OT session. SW met with pt wife Christopher Yu [phone]) who as in room. SW introduced self, explained role, and discussed discharge process. She confirms she will be his primary caregiver despite two teenagers at home. She is aware SW will follow-up with updates as they are available.   SW will follow-up with pt to complete assessment.   Loralee Pacas, MSW, [place] Office: [phone] Cell: [phone] Fax: [phone]

## 2022-08-01 NOTE — Progress Notes (Signed)
Physical Therapy Assessment and Plan  Patient Details  Name: Christopher Yu MRN: [id number] Date of Birth: February 24, 1961  PT Diagnosis: Abnormality of gait, Ataxia, Ataxic gait, Cognitive deficits, Coordination disorder, Difficulty walking, Impaired cognition, and Pain in neck/head (surgical site) Rehab Potential: Good ELOS: 14-18 days   Today's Date: 08/01/2022 PT Individual Time: 1005-1120 PT Individual Time Calculation (min): 75 min    Hospital Problem: Principal Problem:   Pseudomeningocele   Past Medical History:  Past Medical History:  Diagnosis Date   Anxiety    Arthritis, rheumatoid ([place])    dx 1988   CAD (coronary artery disease), native coronary artery    Mild LAD disease with calcification noted at cath 12//27/16    Cardiomyopathy ([place])    Cardiomyopathy ([place])    Chronic systolic heart failure ([place]) 05/01/2015   Depression    Deviated nasal septum 06/25/2011   Headache    High cholesterol    Hyperlipidemia    Impingement syndrome of left shoulder 12/22/2017   Macular degeneration    Rheumatoid aortitis    Sleep apnea    does not use cpap - UNABLE TO TOLERATE MASK   Sleep apnea in adult    deviated septum repaired, most recent sleep study was negative   Status post total right knee replacement 06/01/2015   Past Surgical History:  Past Surgical History:  Procedure Laterality Date   ANKLE FUSION Right 05/05/2012   related to his arthritis   ANKLE FUSION Left 05/25/2013   related to his arthritis   ANKLE SURGERY Bilateral    APPLICATION OF CRANIAL NAVIGATION N/A 04/11/2022   Procedure: APPLICATION OF CRANIAL NAVIGATION;  Surgeon: Consuella Lose, MD;  Location: [facility];  Service: Neurosurgery;  Laterality: N/A;   CARDIAC CATHETERIZATION N/A 05/01/2015   Procedure: Left Heart Cath and Coronary Angiography;  Surgeon: Belva Crome, MD;  Location: [facility];  Service: Cardiovascular;  Laterality: N/A;   CRANIOTOMY N/A 04/11/2022    Procedure: STEREOTACTIC SUBOCCIPITAL CRANIOTOMY FOR RESECTION OF ARTERIO-VENOUS MALFORMATION;  Surgeon: Consuella Lose, MD;  Location: [facility];  Service: Neurosurgery;  Laterality: N/A;   FRACTURE SURGERY     left femur fracture x 3   IR ANGIO EXTERNAL CAROTID SEL EXT CAROTID BILAT MOD SED  01/28/2022   IR ANGIO INTRA EXTRACRAN SEL INTERNAL CAROTID BILAT MOD SED  01/28/2022   IR ANGIO INTRA EXTRACRAN SEL INTERNAL CAROTID BILAT MOD SED  04/16/2022   IR ANGIO VERTEBRAL SEL VERTEBRAL BILAT MOD SED  01/28/2022   IR ANGIO VERTEBRAL SEL VERTEBRAL BILAT MOD SED  04/16/2022   KNEE ARTHROSCOPY     right x 4   LUMBAR LAMINECTOMY/DECOMPRESSION MICRODISCECTOMY N/A 07/18/2022   Procedure: Repair of Pseudomeningiocele posterior;  Surgeon: Consuella Lose, MD;  Location: [facility];  Service: Neurosurgery;  Laterality: N/A;   NASAL SEPTOPLASTY W/ TURBINOPLASTY  06/25/2011   Procedure: NASAL SEPTOPLASTY WITH TURBINATE REDUCTION;  Surgeon: Jerrell Belfast, MD;  Location: [facility];  Service: ENT;  Laterality: Bilateral;   PLACEMENT OF LUMBAR DRAIN N/A 07/18/2022   Procedure: PLACEMENT OF LUMBAR DRAIN;  Surgeon: Consuella Lose, MD;  Location: [facility];  Service: Neurosurgery;  Laterality: N/A;   TONSILLECTOMY     TOTAL KNEE ARTHROPLASTY Right 06/01/2015   Procedure: RIGHT TOTAL KNEE ARTHROPLASTY;  Surgeon: Mcarthur Rossetti, MD;  Location: [facility];  Service: Orthopedics;  Laterality: Right;  Block+general    Assessment & Plan Clinical Impression: Patient is a 62 year old right-handed male with history significant for rheumatoid arthritis maintained  on methotrexate, CAD maintained on low-dose aspirin, OSA with CPAP intolerance, quit smoking 14 years ago, chronic systolic congestive heart failure, 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 Kathyrn Sheriff and discharged home 07/25/2022.  Per chart review lives with spouse and family.  Spouse works 7-12 30 typically.   Two-level home bed and bath main level 3 steps to entry.  Patient was ambulated with a rolling walker and assistance from spouse since recent discharge 3/22.  Patient presented 07/28/2022 with progressive slurred speech gait abnormality and headache.  CT/MRI showed new small focus of acute infarction in the right superior cerebellar hemisphere with surrounding area of increased T2 signal and cystic change new since prior comparison 07/05/2022.  CT angiogram head and neck no vascular lesions seen underlying the cerebellar infarct.  Echocardiogram with ejection fraction of 40 to 45% left ventricle demonstrating global hypokinesis with grade 1 diastolic dysfunction.  Neurology Dr. Leonie Man as well as neurosurgery follow-up Dr. Duffy Rhody follow-up currently maintained on low-dose aspirin.  No current plan for neurosurgical intervention.  Lovenox added for DVT prophylaxis.  Neurology as well as neurosurgery felt that small DWI hyperintensity identified on imaging not felt to be typical of stroke and more likely to be postoperative change.  He was started on steroids with dexamethasone taper.  His home rheumatologic medications currently on hold with plan to consider restarting after dexamethasone taper is complete.  He is currently on a regular diet with nectar thick liquid diet.  Therapy evaluations completed due to patient's decreased functional ability gait abnormality was admitted for a comprehensive rehab program.   Patient currently requires mod with mobility secondary to muscle weakness, decreased cardiorespiratoy endurance, impaired timing and sequencing, ataxia, and decreased coordination, impaired safety awareness, decreased midline orientation and decreased attention to left, decreased problem solving and decreased safety awareness, and decreased standing balance and decreased balance strategies.  Prior to hospitalization, patient was supervision with mobility and lived with Spouse in a House home.  Home  access is 3Stairs to enter.  Patient will benefit from skilled PT intervention to maximize safe functional mobility, minimize fall risk, and decrease caregiver burden for planned discharge home with 24 hour supervision.  Anticipate patient will benefit from follow up OP at discharge.  PT - End of Session Activity Tolerance: Tolerates 30+ min activity with multiple rests Endurance Deficit: Yes Endurance Deficit Description: Impaired endurance/activity tolerance PT Assessment Rehab Potential (ACUTE/IP ONLY): Good PT Barriers to Discharge: Home environment access/layout;Insurance for SNF coverage PT Patient demonstrates impairments in the following area(s): Balance;Perception;Behavior;Safety;Sensory;Endurance;Motor;Pain PT Transfers Functional Problem(s): Bed Mobility;Bed to Chair;Car;Floor;Furniture PT Locomotion Functional Problem(s): Ambulation;Wheelchair Mobility;Stairs PT Plan PT Intensity: Minimum of 1-2 x/day ,45 to 90 minutes PT Frequency: 5 out of 7 days PT Duration Estimated Length of Stay: 14-18 days PT Treatment/Interventions: Community reintegration;Ambulation/gait training;DME/adaptive equipment instruction;Neuromuscular re-education;Psychosocial support;Stair training;UE/LE Strength taining/ROM;Wheelchair propulsion/positioning;Balance/vestibular training;Discharge planning;Functional electrical stimulation;Pain management;Therapeutic Activities;UE/LE Coordination activities;Cognitive remediation/compensation;Disease management/prevention;Functional mobility training;Patient/family education;Splinting/orthotics;Therapeutic Exercise;Visual/perceptual remediation/compensation PT Transfers Anticipated Outcome(s): Supv with LRAD PT Locomotion Anticipated Outcome(s): Supv with LRAD PT Recommendation Recommendations for Other Services: Neuropsych consult;Therapeutic Recreation consult Therapeutic Recreation Interventions: Pet therapy;Kitchen group;Stress management;Outing/community  reintergration Follow Up Recommendations: Outpatient PT Patient destination: Home Equipment Recommended: To be determined Equipment Details: TBD   PT Evaluation Precautions/Restrictions Precautions Precautions: Fall Restrictions Weight Bearing Restrictions: No Other Position/Activity Restrictions: Ataxia, impulsive, impaired problem-solving Pain Interference Pain Interference Pain Effect on Sleep: 1. Rarely or not at all Pain Interference with Therapy Activities: 1. Rarely or not  at all Pain Interference with Day-to-Day Activities: 1. Rarely or not at all Home Living/Prior [place] Available Help at Discharge: Available 24 hours/day;Available PRN/intermittently Type of Home: House Home Access: Stairs to enter [employer] of Steps: 3 Entrance Stairs-Rails: Right Home Layout: Two level;Able to live on main level with bedroom/bathroom Alternate Level Stairs-Number of Steps: Full flight of stairs- Patient's office is on the second floor Alternate Level Stairs-Rails: Right Bathroom Shower/Tub: Walk-in shower Bathroom Toilet: Handicapped height Bathroom Accessibility: No Additional Comments: Patient states walker would not be able to "completely fit" in their bathroom  Lives With: Spouse Prior Function Level of Independence: Requires assistive device for independence;Needs assistance with ADLs;Needs assistance with gait;Needs assistance with tranfers (Patient states wife was assisting with mobility, ADLs, medication management, etc. Patient ambulated with RW and stated he feel twice and both were backwards)  Able to Take Stairs?: Yes Driving: No Vocation: Full time employment Vocation Requirements: Works for a small [employer] firm and works mostly from home Leisure: Hobbies-yes (Comment) (Golf) Vision/Perception  Vision - History Ability to See in Adequate Light: 0 Adequate Perception Perception: Impaired Spatial Orientation: Patient with R lateral bias with  functional mobility Praxis Praxis: Impaired Praxis Impairment Details: Motor planning Praxis-Other Comments: Impaired coordination with ataxic movement patterns  Cognition Overall Cognitive Status: Impaired/Different from baseline Arousal/Alertness: Awake/alert Orientation Level: Oriented X4 Year: 2024 Month: March Day of Week: Incorrect Sustained Attention: Appears intact Memory: Appears intact Awareness: Impaired Problem Solving: Impaired Safety/Judgment: Impaired Sensation Sensation Light Touch: Appears Intact Coordination Gross Motor Movements are Fluid and Coordinated: No Fine Motor Movements are Fluid and Coordinated: No Coordination and Movement Description: Ataxic movement patterns Finger Nose Finger Test: Undershoots with B UE (L>R) Motor  Motor Motor: Ataxia Motor - Skilled Clinical Observations: Ataxic movements patterns with impaired coordination   Trunk/Postural Assessment  Cervical Assessment Cervical Assessment: Within Functional Limits Thoracic Assessment Thoracic Assessment: Within Functional Limits Lumbar Assessment Lumbar Assessment: Within Functional Limits Postural Control Postural Control: Deficits on evaluation Righting Reactions: Delayed and inadequate  Balance Balance Balance Assessed: Yes Static Sitting Balance Static Sitting - Balance Support: No upper extremity supported;Feet supported Static Sitting - Level of Assistance: 5: Stand by assistance Dynamic Sitting Balance Dynamic Sitting - Balance Support: During functional activity;Feet supported Dynamic Sitting - Level of Assistance: 5: Stand by assistance Static Standing Balance Static Standing - Balance Support: During functional activity;Bilateral upper extremity supported Static Standing - Level of Assistance: 5: Stand by assistance;4: Min assist (CGA) Dynamic Standing Balance Dynamic Standing - Balance Support: During functional activity;Bilateral upper extremity supported Dynamic  Standing - Level of Assistance: 4: Min assist;3: Mod assist Extremity Assessment      RLE Assessment RLE Assessment: Within Functional Limits General Strength Comments: Gross strength WFL, however limited by impaired coordination LLE Assessment LLE Assessment: Exceptions to [facility] General Strength Comments: 4/5- limited by impaired coordination  Care Tool Care Tool Bed Mobility Roll left and right activity   Roll left and right assist level: Supervision/Verbal cueing    Sit to lying activity   Sit to lying assist level: Supervision/Verbal cueing    Lying to sitting on side of bed activity   Lying to sitting on side of bed assist level: the ability to move from lying on the back to sitting on the side of the bed with no back support.: Supervision/Verbal cueing     Care Tool Transfers Sit to stand transfer   Sit to stand assist level: Minimal Assistance - Patient > 75%  Chair/bed transfer   Chair/bed transfer assist level: Minimal Assistance - Patient > 75%     Physiological scientist transfer assist level: Minimal Assistance - Patient > 75%      Care Tool Locomotion Ambulation   Assist level: Minimal Assistance - Patient > 75% Assistive device: Walker-rolling Max distance: 150'  Walk 10 feet activity   Assist level: Moderate Assistance - Patient - 50 - 74% Assistive device: Hand held assist   Walk 50 feet with 2 turns activity   Assist level: Minimal Assistance - Patient > 75% Assistive device: Walker-rolling  Walk 150 feet activity   Assist level: Minimal Assistance - Patient > 75% Assistive device: Walker-rolling  Walk 10 feet on uneven surfaces activity   Assist level: Minimal Assistance - Patient > 75% Assistive device: Walker-rolling  Stairs   Assist level: Minimal Assistance - Patient > 75% Stairs assistive device: 2 hand rails Max number of stairs: 12  Walk up/down 1 step activity   Walk up/down 1 step (curb) assist level: Minimal  Assistance - Patient > 75% Walk up/down 1 step or curb assistive device: 2 hand rails  Walk up/down 4 steps activity   Walk up/down 4 steps assist level: Minimal Assistance - Patient > 75% Walk up/down 4 steps assistive device: 2 hand rails  Walk up/down 12 steps activity   Walk up/down 12 steps assist level: Minimal Assistance - Patient > 75% Walk up/down 12 steps assistive device: 2 hand rails  Pick up small objects from floor   Pick up small object from the floor assist level: Minimal Assistance - Patient > 75% Pick up small object from the floor assistive device: RW  Wheelchair Is the patient using a wheelchair?: Yes Type of Wheelchair: Manual   Wheelchair assist level: Minimal Assistance - Patient > 75% Max wheelchair distance: 150'  Wheel 50 feet with 2 turns activity   Assist Level: Minimal Assistance - Patient > 75%  Wheel 150 feet activity   Assist Level: Minimal Assistance - Patient > 75%    Refer to Care Plan for Long Term Goals  SHORT TERM GOAL WEEK 1 PT Short Term Goal 1 (Week 1): Patient will perform bed/chair transfer with LRAD and CGA PT Short Term Goal 2 (Week 1): Patient will ambulate >108' with LRAD and CGA PT Short Term Goal 3 (Week 1): Patient will ascend/descend > a flight of stairs with R HR and CGA  Recommendations for other services: Neuropsych and Therapeutic Recreation  Pet therapy, Kitchen group, Stress management, and Outing/community reintegration  Skilled Therapeutic Intervention Mobility Bed Mobility Bed Mobility: Rolling Right;Rolling Left;Supine to Sit;Sit to Supine Rolling Right: Supervision/verbal cueing Rolling Left: Supervision/Verbal cueing Supine to Sit: Supervision/Verbal cueing Sit to Supine: Supervision/Verbal cueing Transfers Transfers: Sit to Stand;Stand to Sit;Stand Pivot Transfers Sit to Stand: Minimal Assistance - Patient > 75% Stand to Sit: Minimal Assistance - Patient > 75% Stand Pivot Transfers: Minimal Assistance - Patient  > 75% Stand Pivot Transfer Details: Verbal cues for technique;Verbal cues for precautions/safety;Verbal cues for safe use of DME/AE Transfer (Assistive device): Rolling walker Locomotion  Gait Ambulation: Yes Gait Assistance: Minimal Assistance - Patient > 75% Gait Distance (Feet): 150 Feet Assistive device: Rolling walker Gait Assistance Details: Verbal cues for technique;Verbal cues for safe use of DME/AE;Verbal cues for precautions/safety;Verbal cues for gait pattern Gait Gait: Yes Gait Pattern: Shuffle;Poor foot clearance - right (R lateral lean throughout gait leading to poor R foot  clearance) Gait velocity: Decreased Stairs / Additional Locomotion Stairs: Yes Stairs Assistance: Minimal Assistance - Patient > 75% Stair Management Technique: Two rails Number of Stairs: 12 Height of Stairs: 6 Ramp: Minimal Assistance - Patient >75% Curb: Minimal Assistance - Patient >75% Wheelchair Mobility Wheelchair Mobility: Yes Wheelchair Assistance: Minimal assistance - Patient >75% Wheelchair Propulsion: Both upper extremities Wheelchair Parts Management: Needs assistance Distance: 150'   Skilled Intervention- Patient greeted supine in bed and agreeable to PT treatment session- Patient reported 4/10 pain in his head, however pre-medicated prior to start of treatment session. Evaluation completed (see details above and below) with education on PT POC and goals and individual treatment initiated with focus on dynamic stability, transfers, gait, endurance/activity tolerance and improved coordination. Patient performed all functional mobility with the use of a RW and MinA- Patient required VC for staying within the middle of the frame of the RW as patient has a R lateral bias with functional mobility. Patient gait trained x35' without the use of an AD and L HHA with ModA overall- Patient demonstrated propulsive gait pattern with poor foot clearance and anterior lean secondary to impaired  coordination and poor midline posturing. Patient returned to his room sitting upright in wheelchair with posey belt on, call bell within reach, RN present and all needs met.    Discharge Criteria: Patient will be discharged from PT if patient refuses treatment 3 consecutive times without medical reason, if treatment goals not met, if there is a change in medical status, if patient makes no progress towards goals or if patient is discharged from hospital.  The above assessment, treatment plan, treatment alternatives and goals were discussed and mutually agreed upon: by patient  [place] 08/01/2022, 12:04 PM

## 2022-08-01 NOTE — Evaluation (Signed)
Speech Language Pathology Assessment and Plan  Patient Details  Name: Christopher Yu MRN: [id number] Date of Birth: 1960/11/11  SLP Diagnosis: Dysarthria;Cognitive Impairments;Dysphagia  Rehab Potential: Excellent ELOS: 14-18 days    Today's Date: 08/01/2022 SLP Individual Time: [id number] SLP Individual Time Calculation (min): 65 min   Hospital Problem: Principal Problem:   Pseudomeningocele  Past Medical History:  Past Medical History:  Diagnosis Date   Anxiety    Arthritis, rheumatoid ([place])    dx 1988   CAD (coronary artery disease), native coronary artery    Mild LAD disease with calcification noted at cath 12//27/16    Cardiomyopathy ([place])    Cardiomyopathy ([place])    Chronic systolic heart failure ([place]) 05/01/2015   Depression    Deviated nasal septum 06/25/2011   Headache    High cholesterol    Hyperlipidemia    Impingement syndrome of left shoulder 12/22/2017   Macular degeneration    Rheumatoid aortitis    Sleep apnea    does not use cpap - UNABLE TO TOLERATE MASK   Sleep apnea in adult    deviated septum repaired, most recent sleep study was negative   Status post total right knee replacement 06/01/2015   Past Surgical History:  Past Surgical History:  Procedure Laterality Date   ANKLE FUSION Right 05/05/2012   related to his arthritis   ANKLE FUSION Left 05/25/2013   related to his arthritis   ANKLE SURGERY Bilateral    APPLICATION OF CRANIAL NAVIGATION N/A 04/11/2022   Procedure: APPLICATION OF CRANIAL NAVIGATION;  Surgeon: Consuella Lose, MD;  Location: [facility];  Service: Neurosurgery;  Laterality: N/A;   CARDIAC CATHETERIZATION N/A 05/01/2015   Procedure: Left Heart Cath and Coronary Angiography;  Surgeon: Belva Crome, MD;  Location: [facility];  Service: Cardiovascular;  Laterality: N/A;   CRANIOTOMY N/A 04/11/2022   Procedure: STEREOTACTIC SUBOCCIPITAL CRANIOTOMY FOR RESECTION OF ARTERIO-VENOUS MALFORMATION;  Surgeon:  Consuella Lose, MD;  Location: [facility];  Service: Neurosurgery;  Laterality: N/A;   FRACTURE SURGERY     left femur fracture x 3   IR ANGIO EXTERNAL CAROTID SEL EXT CAROTID BILAT MOD SED  01/28/2022   IR ANGIO INTRA EXTRACRAN SEL INTERNAL CAROTID BILAT MOD SED  01/28/2022   IR ANGIO INTRA EXTRACRAN SEL INTERNAL CAROTID BILAT MOD SED  04/16/2022   IR ANGIO VERTEBRAL SEL VERTEBRAL BILAT MOD SED  01/28/2022   IR ANGIO VERTEBRAL SEL VERTEBRAL BILAT MOD SED  04/16/2022   KNEE ARTHROSCOPY     right x 4   LUMBAR LAMINECTOMY/DECOMPRESSION MICRODISCECTOMY N/A 07/18/2022   Procedure: Repair of Pseudomeningiocele posterior;  Surgeon: Consuella Lose, MD;  Location: [facility];  Service: Neurosurgery;  Laterality: N/A;   NASAL SEPTOPLASTY W/ TURBINOPLASTY  06/25/2011   Procedure: NASAL SEPTOPLASTY WITH TURBINATE REDUCTION;  Surgeon: Jerrell Belfast, MD;  Location: [facility];  Service: ENT;  Laterality: Bilateral;   PLACEMENT OF LUMBAR DRAIN N/A 07/18/2022   Procedure: PLACEMENT OF LUMBAR DRAIN;  Surgeon: Consuella Lose, MD;  Location: [facility];  Service: Neurosurgery;  Laterality: N/A;   TONSILLECTOMY     TOTAL KNEE ARTHROPLASTY Right 06/01/2015   Procedure: RIGHT TOTAL KNEE ARTHROPLASTY;  Surgeon: Mcarthur Rossetti, MD;  Location: [facility];  Service: Orthopedics;  Laterality: Right;  Block+general    Assessment / Plan / Recommendation Clinical Impression Patient  is a 62 year old right-handed male with history significant for rheumatoid arthritis maintained on methotrexate, CAD maintained on low-dose aspirin, OSA with CPAP intolerance, quit smoking 14  years ago, chronic systolic congestive heart failure, 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 Kathyrn Sheriff and discharged home 07/25/2022.    Patient presented 07/28/2022 with progressive slurred speech gait abnormality and headache.  CT/MRI showed new small focus of acute infarction in the right superior  cerebellar hemisphere with surrounding area of increased T2 signal and cystic change new since prior comparison 07/05/2022.  CT angiogram head and neck no vascular lesions seen underlying the cerebellar infarct.  Echocardiogram with ejection fraction of 40 to 45% left ventricle demonstrating global hypokinesis with grade 1 diastolic dysfunction.  Neurology as well as neurosurgery felt that small DWI hyperintensity identified on imaging not felt to be typical of stroke and more likely to be postoperative change.  He was started on steroids with dexamethasone taper.  Therapy evaluations completed with recommendations for a comprehensive rehab program. Patient admitted 07/31/22.  Upon arrival, patient awake while sitting EOB. Patient reported he had already consumed a banana with "no problems" and agreeable to consuming his breakfast tray.  Prior to consuming breakfast, patient with a baseline wet vocal quality that required cues to self-monitor and correct. Patient demonstrated efficient mastication and minimal oral residue with regular solid textures without overt s/s of aspiration. Intermittent overt s/s of aspiration were observed with nectar-thick liquids characterized by wet vocal quality and delayed coughing. Moderate verbal cues were needed for a complete head turn to the left (patient often turning his body along with his head), use of small sips and multiple swallows, and to minimize talking during PO intake. Overall, bedside presentation appears consistent with MBS results. Recommend patient continue current diet but patient will require close supervision for use of swallowing compensatory strategies and diet tolerance.  SLP administered the Cognistat in which the patient scored WFL on all subtests except for mild impairments in short-term recall. During informal assessment, deficits in attention and awareness were observed.   Patient demonstrates a moderate to severe ataxic dysarthria characterized by  decreased/uncoordinated respiratory support, impaired prosody and imprecise consonants impacting his overall speech intelligibility at the phrase level.   Patient would benefit from skilled SLP intervention to maximize his swallowing and cognitive functioning as well as his functional communication prior to discharge.     Skilled Therapeutic Interventions          Administered a BSE and cognitive-linguistic evaluation, please see above for details.   SLP Assessment  Patient will need skilled Speech Lanaguage Pathology Services during CIR admission    Recommendations  SLP Diet Recommendations: Age appropriate regular solids;Nectar (with focus on choosing softer foods) Medication Administration: Crushed with puree Supervision: Patient able to self feed;Full supervision/cueing for compensatory strategies Compensations: Slow rate;Small sips/bites;Multiple dry swallows after each bite/sip;Clear throat intermittently Postural Changes and/or Swallow Maneuvers: Seated upright 90 degrees;Head turn left during swallow (with liquids) Oral Care Recommendations: Oral care BID Recommendations for Other Services: Neuropsych consult Patient destination: Home Follow up Recommendations: Outpatient SLP;Home Health SLP;24 hour supervision/assistance Equipment Recommended: To be determined    SLP Frequency 3 to 5 out of 7 days   SLP Duration  SLP Intensity  SLP Treatment/Interventions 14-18 days  Minumum of 1-2 x/day, 30 to 90 minutes  Cognitive remediation/compensation;Cueing hierarchy;Dysphagia/aspiration precaution training;Environmental controls;Functional tasks;Internal/external aids;Speech/Language facilitation;Patient/family education;Therapeutic Activities    Pain Intermittent pain at surgery site Nursing aware  Prior Functioning Type of Home: House  Lives With: Spouse Available Help at Discharge: Available 24 hours/day;Available PRN/intermittently Vocation: Full time employment  SLP  Evaluation Cognition Overall  Cognitive Status: Impaired/Different from baseline Arousal/Alertness: Awake/alert Orientation Level: Oriented X4 Attention: Sustained Sustained Attention: Appears intact Memory: Appears intact Awareness: Impaired Awareness Impairment: Emergent impairment Problem Solving: Impaired Problem Solving Impairment: Functional basic Safety/Judgment: Impaired  Comprehension Auditory Comprehension Overall Auditory Comprehension: Appears within functional limits for tasks assessed Expression Expression Primary Mode of Expression: Verbal Verbal Expression Overall Verbal Expression: Appears within functional limits for tasks assessed Written Expression Dominant Hand: Right Oral Motor Oral Motor/Sensory Function Overall Oral Motor/Sensory Function: Mild impairment Facial ROM: Reduced left;Suspected CN VII (facial) dysfunction Facial Symmetry: Suspected CN VII (facial) dysfunction;Abnormal symmetry left Facial Strength: Reduced left;Suspected CN VII (facial) dysfunction Motor Speech Overall Motor Speech: Impaired Respiration: Impaired Level of Impairment: Phrase Resonance: Hypernasality Articulation: Impaired Level of Impairment: Word Intelligibility: Intelligibility reduced Word: 75-100% accurate Phrase: 75-100% accurate Sentence: 50-74% accurate Motor Planning: Impaired Effective Techniques: Over-articulate;Increased vocal intensity;Slow rate  Care Tool Care Tool Cognition Ability to hear (with hearing aid or hearing appliances if normally used Ability to hear (with hearing aid or hearing appliances if normally used): 0. Adequate - no difficulty in normal conservation, social interaction, listening to TV   Expression of Ideas and Wants Expression of Ideas and Wants: 3. Some difficulty - exhibits some difficulty with expressing needs and ideas (e.g, some words or finishing thoughts) or speech is not clear   Understanding Verbal and Non-Verbal Content  Understanding Verbal and Non-Verbal Content: 4. Understands (complex and basic) - clear comprehension without cues or repetitions  Memory/Recall Ability Memory/Recall Ability : Current season;Location of own room;Staff names and faces;That he or she is in a hospital/hospital unit   Bedside Swallowing Assessment General Date of Onset: 07/30/22 Previous Swallow Assessment: MBS; Recommended regular textures with nectar-thick liquids with use of compensatory strategies Diet Prior to this Study: Regular;Mildly thick liquids (Level 2, nectar thick) Temperature Spikes Noted: No Respiratory Status: Room air History of Recent Intubation: No Behavior/Cognition: Alert;Cooperative;Pleasant mood;Impulsive Oral Cavity - Dentition: Adequate natural dentition Self-Feeding Abilities: Able to feed self;Needs set up Vision: Functional for self-feeding Patient Positioning: Upright in bed Baseline Vocal Quality: Wet Volitional Cough: Weak Volitional Swallow: Able to elicit  Ice Chips Ice chips: Not tested Thin Liquid Thin Liquid: Not tested Nectar Thick Nectar Thick Liquid: Impaired Presentation: Cup Pharyngeal Phase Impairments: Suspected delayed Swallow;Multiple swallows;Wet Vocal Quality;Cough - Delayed Honey Thick Honey Thick Liquid: Not tested Puree Puree: Within functional limits Presentation: Self Fed;Spoon Solid Solid: Within functional limits Presentation: Self Fed;Spoon BSE Assessment Risk for Aspiration Impact on safety and function: Mild aspiration risk;Moderate aspiration risk  Short Term Goals: Week 1: SLP Short Term Goal 1 (Week 1): Patient will consume curent diet with Min verbal cues for use of swallowing compensatory strategies. SLP Short Term Goal 2 (Week 1): Patient will utilize speech intelligibility strategies at the phrase level to achieve ~90% intelligibility with Min verbal cues. SLP Short Term Goal 3 (Week 1): Patient will demonstrate sustained attention to functional  tasks for 30 minutes with  min verbal cues for redirection. SLP Short Term Goal 4 (Week 1): Patient will self-monitor and correct communication breakdowns with Mod verbal cues. SLP Short Term Goal 5 (Week 1): Patient will self-monitor and correct wet vocal quality with mod verbal cues.  Refer to Care Plan for Long Term Goals  Recommendations for other services: Neuropsych  Discharge Criteria: Patient will be discharged from SLP if patient refuses treatment 3 consecutive times without medical reason, if treatment goals not met, if there is a change in medical status, if patient  makes no progress towards goals or if patient is discharged from hospital.  The above assessment, treatment plan, treatment alternatives and goals were discussed and mutually agreed upon: by patient  Valina Maes 08/01/2022, 4:43 PM

## 2022-08-01 NOTE — Plan of Care (Signed)
  Problem: RH Balance Goal: LTG Patient will maintain dynamic standing with ADLs (OT) Description: LTG:  Patient will maintain dynamic standing balance with assist during activities of daily living (OT)  Flowsheets (Taken 08/01/2022 1517) LTG: Pt will maintain dynamic standing balance during ADLs with: Supervision/Verbal cueing   Problem: Sit to Stand Goal: LTG:  Patient will perform sit to stand in prep for activites of daily living with assistance level (OT) Description: LTG:  Patient will perform sit to stand in prep for activites of daily living with assistance level (OT) Flowsheets (Taken 08/01/2022 1517) LTG: PT will perform sit to stand in prep for activites of daily living with assistance level: Supervision/Verbal cueing   Problem: RH Grooming Goal: LTG Patient will perform grooming w/assist,cues/equip (OT) Description: LTG: Patient will perform grooming with assist, with/without cues using equipment (OT) Flowsheets (Taken 08/01/2022 1517) LTG: Pt will perform grooming with assistance level of: Independent with assistive device    Problem: RH Bathing Goal: LTG Patient will bathe all body parts with assist levels (OT) Description: LTG: Patient will bathe all body parts with assist levels (OT) Flowsheets (Taken 08/01/2022 1517) LTG: Pt will perform bathing with assistance level/cueing: Supervision/Verbal cueing   Problem: RH Dressing Goal: LTG Patient will perform upper body dressing (OT) Description: LTG Patient will perform upper body dressing with assist, with/without cues (OT). Flowsheets (Taken 08/01/2022 1517) LTG: Pt will perform upper body dressing with assistance level of: Independent with assistive device Goal: LTG Patient will perform lower body dressing w/assist (OT) Description: LTG: Patient will perform lower body dressing with assist, with/without cues in positioning using equipment (OT) Flowsheets (Taken 08/01/2022 1517) LTG: Pt will perform lower body dressing with  assistance level of: Supervision/Verbal cueing   Problem: RH Toileting Goal: LTG Patient will perform toileting task (3/3 steps) with assistance level (OT) Description: LTG: Patient will perform toileting task (3/3 steps) with assistance level (OT)  Flowsheets (Taken 08/01/2022 1517) LTG: Pt will perform toileting task (3/3 steps) with assistance level: Supervision/Verbal cueing   Problem: RH Functional Use of Upper Extremity Goal: LTG Patient will use RT/LT upper extremity as a (OT) Description: LTG: Patient will use right/left upper extremity as a stabilizer/gross assist/diminished/nondominant/dominant level with assist, with/without cues during functional activity (OT) Flowsheets (Taken 08/01/2022 1517) LTG: Use of upper extremity in functional activities: LUE as diminished level   Problem: RH Toilet Transfers Goal: LTG Patient will perform toilet transfers w/assist (OT) Description: LTG: Patient will perform toilet transfers with assist, with/without cues using equipment (OT) Flowsheets (Taken 08/01/2022 1517) LTG: Pt will perform toilet transfers with assistance level of: Supervision/Verbal cueing   Problem: RH Tub/Shower Transfers Goal: LTG Patient will perform tub/shower transfers w/assist (OT) Description: LTG: Patient will perform tub/shower transfers with assist, with/without cues using equipment (OT) Flowsheets (Taken 08/01/2022 1517) LTG: Pt will perform tub/shower stall transfers with assistance level of: Supervision/Verbal cueing

## 2022-08-01 NOTE — Plan of Care (Signed)
  Problem: RH Swallowing Goal: LTG Patient will consume least restrictive diet using compensatory strategies with assistance (SLP) Description: LTG:  Patient will consume least restrictive diet using compensatory strategies with assistance (SLP) Flowsheets (Taken 08/01/2022 1646) LTG: Pt Patient will consume least restrictive diet using compensatory strategies with assistance of (SLP): Supervision Goal: LTG Patient will participate in dysphagia therapy to increase swallow function with assistance (SLP) Description: LTG:  Patient will participate in dysphagia therapy to increase swallow function with assistance (SLP) Flowsheets (Taken 08/01/2022 1646) LTG: Pt will participate in dysphagia therapy to increase swallow function with assistance of (SLP): Supervision Goal: LTG Pt will demonstrate functional change in swallow as evidenced by bedside/clinical objective assessment (SLP) Description: LTG: Patient will demonstrate functional change in swallow as evidenced by bedside/clinical objective assessment (SLP) Flowsheets (Taken 08/01/2022 1646) LTG: Patient will demonstrate functional change in swallow as evidenced by bedside/clinical objective assessment: Oropharyngeal swallow   Problem: RH Expression Communication Goal: LTG Patient will increase speech intelligibility (SLP) Description: LTG: Patient will increase speech intelligibility at word/phrase/conversation level with cues, % of the time (SLP) Flowsheets (Taken 08/01/2022 1646) LTG: Patient will increase speech intelligibility (SLP): Minimal Assistance - Patient > 75%   Problem: RH Attention Goal: LTG Patient will demonstrate this level of attention during functional activites (SLP) Description: LTG:  Patient will will demonstrate this level of attention during functional activites (SLP) Flowsheets (Taken 08/01/2022 1646) Patient will demonstrate during cognitive/linguistic activities the attention type of: Selective LTG: Patient will  demonstrate this level of attention during cognitive/linguistic activities with assistance of (SLP): Supervision   Problem: RH Awareness Goal: LTG: Patient will demonstrate awareness during functional activites type of (SLP) Description: LTG: Patient will demonstrate awareness during functional activites type of (SLP) Flowsheets (Taken 08/01/2022 1646) Patient will demonstrate during cognitive/linguistic activities awareness type of: Emergent LTG: Patient will demonstrate awareness during cognitive/linguistic activities with assistance of (SLP): Supervision

## 2022-08-01 NOTE — Plan of Care (Signed)
Problem: RH Balance Goal: LTG Patient will maintain dynamic standing balance (PT) Description: LTG:  Patient will maintain dynamic standing balance with assistance during mobility activities (PT) Flowsheets (Taken 08/01/2022 1206) LTG: Pt will maintain dynamic standing balance during mobility activities with:: Supervision/Verbal cueing   Problem: Sit to Stand Goal: LTG:  Patient will perform sit to stand with assistance level (PT) Description: LTG:  Patient will perform sit to stand with assistance level (PT) Flowsheets (Taken 08/01/2022 1206) LTG: PT will perform sit to stand in preparation for functional mobility with assistance level: Supervision/Verbal cueing   Problem: RH Bed Mobility Goal: LTG Patient will perform bed mobility with assist (PT) Description: LTG: Patient will perform bed mobility with assistance, with/without cues (PT). Flowsheets (Taken 08/01/2022 1206) LTG: Pt will perform bed mobility with assistance level of: Independent with assistive device    Problem: RH Bed to Chair Transfers Goal: LTG Patient will perform bed/chair transfers w/assist (PT) Description: LTG: Patient will perform bed to chair transfers with assistance (PT). Flowsheets (Taken 08/01/2022 1206) LTG: Pt will perform Bed to Chair Transfers with assistance level: Supervision/Verbal cueing   Problem: RH Car Transfers Goal: LTG Patient will perform car transfers with assist (PT) Description: LTG: Patient will perform car transfers with assistance (PT). Flowsheets (Taken 08/01/2022 1206) LTG: Pt will perform car transfers with assist:: Supervision/Verbal cueing   Problem: RH Ambulation Goal: LTG Patient will ambulate in home environment (PT) Description: LTG: Patient will ambulate in home environment, # of feet with assistance (PT). Flowsheets (Taken 08/01/2022 1206) LTG: Pt will ambulate in home environ  assist needed:: Supervision/Verbal cueing LTG: Ambulation distance in home environment: 50' Goal:  LTG Patient will ambulate in community environment (PT) Description: LTG: Patient will ambulate in community environment, # of feet with assistance (PT). Flowsheets (Taken 08/01/2022 1206) LTG: Pt will ambulate in community environ  assist needed:: Supervision/Verbal cueing LTG: Ambulation distance in community environment: 150'   Problem: RH Stairs Goal: LTG Patient will ambulate up and down stairs w/assist (PT) Description: LTG: Patient will ambulate up and down # of stairs with assistance (PT) Flowsheets (Taken 08/01/2022 1206) LTG: Pt will ambulate up/down stairs assist needed:: Supervision/Verbal cueing LTG: Pt will  ambulate up and down number of stairs: Flight of stairs with R HR

## 2022-08-02 DIAGNOSIS — G4489 Other headache syndrome: Secondary | ICD-10-CM | POA: Diagnosis not present

## 2022-08-02 DIAGNOSIS — G4700 Insomnia, unspecified: Secondary | ICD-10-CM | POA: Diagnosis not present

## 2022-08-02 DIAGNOSIS — G96198 Other disorders of meninges, not elsewhere classified: Secondary | ICD-10-CM | POA: Diagnosis not present

## 2022-08-02 MED ORDER — PROSIGHT PO TABS
1.0000 | ORAL_TABLET | Freq: Every day | ORAL | Status: DC
  Administered 2022-08-02 – 2022-08-26 (×24): 1 via ORAL
  Filled 2022-08-02 (×25): qty 1

## 2022-08-02 MED ORDER — ARTIFICIAL TEARS OPHTHALMIC OINT: TOPICAL_OINTMENT | Freq: Every evening | OPHTHALMIC | Status: DC | PRN

## 2022-08-02 MED ORDER — ARTIFICIAL TEARS OPHTHALMIC OINT
TOPICAL_OINTMENT | Freq: Two times a day (BID) | OPHTHALMIC | Status: DC
  Filled 2022-08-02 (×2): qty 3.5

## 2022-08-02 MED ORDER — ERGOCALCIFEROL 200 MCG/ML PO SOLN
2000.0000 [IU] | Freq: Every day | ORAL | Status: DC
  Administered 2022-08-02 – 2022-08-26 (×24): 2000 [IU] via ORAL
  Filled 2022-08-02 (×26): qty 0.25

## 2022-08-02 MED ORDER — GABAPENTIN 300 MG PO CAPS
300.0000 mg | ORAL_CAPSULE | Freq: Two times a day (BID) | ORAL | Status: DC
  Administered 2022-08-02 – 2022-08-11 (×17): 300 mg via ORAL
  Filled 2022-08-02 (×14): qty 1
  Filled 2022-08-02: qty 3
  Filled 2022-08-02 (×2): qty 1

## 2022-08-02 MED ORDER — POLYETHYL GLYCOL-PROPYL GLYCOL 0.4-0.3 % OP SOLN: Freq: Every day | OPHTHALMIC | Status: DC

## 2022-08-02 MED ORDER — ACETAMINOPHEN 325 MG PO TABS
650.0000 mg | ORAL_TABLET | Freq: Three times a day (TID) | ORAL | Status: DC
  Administered 2022-08-02 – 2022-08-26 (×64): 650 mg via ORAL
  Filled 2022-08-02 (×69): qty 2

## 2022-08-02 MED ORDER — HYDROCODONE-ACETAMINOPHEN 5-325 MG PO TABS
1.0000 | ORAL_TABLET | Freq: Four times a day (QID) | ORAL | Status: AC | PRN
  Administered 2022-08-02 – 2022-08-08 (×7): 1 via ORAL
  Filled 2022-08-02 (×7): qty 1

## 2022-08-02 MED ORDER — CALCIUM CARBONATE ANTACID 500 MG PO CHEW
1.0000 | CHEWABLE_TABLET | Freq: Every day | ORAL | Status: DC
  Administered 2022-08-02 – 2022-08-25 (×23): 200 mg via ORAL
  Filled 2022-08-02 (×23): qty 1

## 2022-08-02 MED ORDER — MELATONIN 5 MG PO TABS
5.0000 mg | ORAL_TABLET | Freq: Every day | ORAL | Status: DC
  Administered 2022-08-02 – 2022-08-25 (×23): 5 mg via ORAL
  Filled 2022-08-02 (×23): qty 1

## 2022-08-02 MED ORDER — OCUVITE-LUTEIN PO CAPS
ORAL_CAPSULE | Freq: Every day | ORAL | Status: DC
  Filled 2022-08-02: qty 1

## 2022-08-02 NOTE — Progress Notes (Signed)
Occupational Therapy Session Note  Patient Details  Name: Xylon Weldin MRN: [id number] Date of Birth: 03/16/1961  Today's Date: 08/02/2022 OT Individual Time: 0815-0900 OT Individual Time Calculation (min): 45 min    Short Term Goals: Week 1:  OT Short Term Goal 1 (Week 1): Patient will maintain standing balance at the sink with CGA within functional BADL task OT Short Term Goal 2 (Week 1): Patient will complete LB dressing with min A OT Short Term Goal 3 (Week 1): Patient will tolerate standing for 3 minutes in preparation for BADL tasks  Skilled Therapeutic Interventions/Progress Updates:    P trecieved in bed agreeable to OT. Pt with no pain requesting to get OOB, get dresssed and eat breakfast. Pt needs min A at EOB for trunk control d/t ataxia while donning UB and LB clothing. Pt with 2 overt LOB to L needing MAX to recover to midline. MIN A for components of shirt and pants at seated/sit to stand level with RW. Pt transfers into w/c for grooming with S. Pt completes self feeding in supported sititng position with mod cuing for swallowing stratiegies as outlined in SLP plan while consuming regular textures and nectar liqiuids. 2 coughs overt s/s of aspirtaion with 1 expulsion of sausage. offered adaptive feeding utensils utilized to improve self feeding/ hand to mouth excursion but pt declines weighted utensils with built up handles. Exited session with pt seated in w/c, exit alarm on and call light in reach    Therapy Documentation Precautions:  Precautions Precautions: Fall Restrictions Weight Bearing Restrictions: No Other Position/Activity Restrictions: Ataxia, impulsive, impaired problem-solving   Therapy/Group: Individual Therapy  Tonny Branch 08/02/2022, 9:00 AM

## 2022-08-02 NOTE — Progress Notes (Signed)
Occupational Therapy Session Note  Patient Details  Name: Christopher Yu MRN: [id number] Date of Birth: 10-18-1960  Today's Date: 08/02/2022 OT Individual Time: 1100-1158 OT Individual Time Calculation (min): 58 min    Short Term Goals: Week 1:  OT Short Term Goal 1 (Week 1): Patient will maintain standing balance at the sink with CGA within functional BADL task OT Short Term Goal 2 (Week 1): Patient will complete LB dressing with min A OT Short Term Goal 3 (Week 1): Patient will tolerate standing for 3 minutes in preparation for BADL tasks   Skilled Therapeutic Interventions/Progress Updates:    Pt up in wheelchair at time of session with ADL needs met, no c/o pain. Spouse present who remained throughout session. Pt transported to therapy gym and performing multiple short distance mobility trials with transfer to chairs/mat with cues for pacing with RW, CGA/MIN for ataxia. Seated at [facility], pt performing 1x10 sit ups + basketball toss to spouse. In standing, pt performing multiple dynamic standing balance based activities with basketball toss, chest press, bounce multiple rounds with pt dropping approx 30% of ball toss, encouarged to incorporate LUE to promote symmetry. Pt with occasional LOB needing MIN A to steady given no UE support. Pt in standing performing 15/15 taps alternating LE on various colors up to sequence of 3 to improve single leg stance and balance for LB ADL tasks. Ambulated back to room with RW and wheelchair following MIN/GCA. Set up in wheelchair alarm on call bell in reach.   Therapy Documentation Precautions:  Precautions Precautions: Fall Restrictions Weight Bearing Restrictions: No Other Position/Activity Restrictions: Ataxia, impulsive, impaired problem-solving    Therapy/Group: Individual Therapy  Viona Gilmore 08/02/2022, 12:02 PM

## 2022-08-02 NOTE — Progress Notes (Signed)
Speech Language Pathology Daily Session Note  Patient Details  Name: Christopher Yu MRN: [id number] Date of Birth: 06/12/1960  Today's Date: 08/02/2022 SLP Individual Time: 0910-1000 SLP Individual Time Calculation (min): 50 min  Short Term Goals: Week 1: SLP Short Term Goal 1 (Week 1): Patient will consume curent diet with Min verbal cues for use of swallowing compensatory strategies. SLP Short Term Goal 2 (Week 1): Patient will utilize speech intelligibility strategies at the phrase level to achieve ~90% intelligibility with Min verbal cues. SLP Short Term Goal 3 (Week 1): Patient will demonstrate sustained attention to functional tasks for 30 minutes with  min verbal cues for redirection. SLP Short Term Goal 4 (Week 1): Patient will self-monitor and correct communication breakdowns with Mod verbal cues. SLP Short Term Goal 5 (Week 1): Patient will self-monitor and correct wet vocal quality with mod verbal cues.  Skilled Therapeutic Interventions:  Pt was seen for skilled ST targeting goals for speech and swallowing.  Session began late as pt and family were having a discussion with the PA regarding current medications.  SLP facilitated the session with a functional snack of regular textures and nectar thick liquids to monitor toleration of current diet and continue working towards diet progression.  Pt utilized recommended swallowing precautions with min assist and intermittently wet vocal quality which cleared with use of a throat clear followed by a second swallow.  Pt's wife presented with questions regarding appropriate foods for pt to eat right now as his PO intake has been limited.  SLP provided pt's wife with diet handouts to help in meal selections and answered all questions to pt's and wife's satisfaction at this time.  During conversations with SLP, pt needed min cues to slow rate, increase vocal intensity, and overarticulate to achieve intelligibility at the phrase level.   SLP posted a handout of speech strategies in pt's room to maximize carryover in between therapy sessions.  Pt was left in chair with wife at bedside.  Continue per current plan of care.    Pain Pain Assessment Pain Scale: 0-10 Pain Score: 0-No pain  Therapy/Group: Individual Therapy  Karson Chicas, Selinda Orion 08/02/2022, 12:45 PM

## 2022-08-02 NOTE — Progress Notes (Signed)
PROGRESS NOTE   Subjective/Complaints:  Pt doing alright, didn't sleep great last night, wasn't aware that he needed to request his melatonin. Would prefer that be scheduled rather than PRN.  Wife with many questions about meds being d/c'd. Wants to make sure he's on all the correct meds. Has a list of meds with her. (List of BP/CHF meds that weren't restarted: Spironolactone 12.5mg  QD, Metoprolol 25mg  QD, Lisinopril 10mg  QD [on hold from before]; RA/bone health meds: calcium supplement, Vitamin D3 2000IU QD; Macular degen meds: preservision AREDS2 1 tab BID, Systane 0.6% 1gtt BID). Explained other RA meds on hold while on steroids.  Was taking Hydrocodone 5-325mg  PRN at home but not reordered here, would like this reordered because he's still having incisional/post op pain.  Having some HAs still. Would prefer gabapentin be scheduled rather than PRN.  LBM yesterday.  Urinating fine.  Has some bruises to his R wrist, but denies pain here.  Denies any other complaints or concerns.   ROS: +HAs, +insomnia, +bruises R wrist but no pain. Denies fevers, chills, N/V, abdominal pain, constipation, diarrhea, SOB, cough, chest pain, new weakness or paraesthesias.    Objective:   No results found. Recent Labs    07/31/22 0640 08/01/22 0917  WBC 10.2 11.0*  HGB 10.4* 10.6*  HCT 31.7* 32.7*  PLT 241 241   Recent Labs    07/31/22 0640 08/01/22 0917  NA 140 140  K 3.5 3.5  CL 102 102  CO2 28 26  GLUCOSE 125* 149*  BUN 16 29*  CREATININE 0.82 0.93  CALCIUM 9.1 9.1    Intake/Output Summary (Last 24 hours) at 08/02/2022 1411 Last data filed at 08/02/2022 1324 Gross per 24 hour  Intake 1060 ml  Output 600 ml  Net 460 ml        Physical Exam: Vital Signs Blood pressure (!) 126/92, pulse 99, temperature 97.6 F (36.4 C), temperature source Oral, resp. rate 18, height 6\' 1"  (1.854 m), weight 90.2 kg, SpO2 95 %.    General:  NAD, lying in bed HEENT: Head with occipital swelling noted, fluctuant, nontender, no warmth--not reassessed today PERRLA, EOMI, sclera anicteric, oral mucosa pink and moist  Neck: Supple without JVD Heart: Reg rate and rhythm. No murmurs rubs or gallops Chest: CTA bilaterally without wheezes, rales, or rhonchi; no distress Abdomen: Soft, non-tender, non-distended, bowel sounds positive. Extremities: No clubbing, cyanosis, or edema. Pulses are 2+ Psych: Pt's affect is appropriate. Pt is cooperative Skin: Clean and intact without signs of breakdown, healed incision/scar to posterior neck Neuro: Speech apraxia and dysarthria MsK: small bruise to R wrist over ulnar styloid without tenderness or crepitus, FROM intact, small bruise over [facility] joint as well, also with FROM intact and without tenderness or crepitus  PRIOR EXAM: Neuro: Alert and oriented x 4, Strength 5 out of 5 in bilateral upper extremities Strength 4+ out of 5 in proximal lower extremities, 5 out of 5 in distal lower extremities Sensation intact light touch in all 4 extremities + Speech apraxia and mild word confusion +dysarthria + FTN slightly altered L>R HTS b/l slightly slow to complete Musculoskeletal: Normal bulk and tone, no joint swelling noted Healed right  TKA incision   Assessment/Plan: 1. Functional deficits which require 3+ hours per day of interdisciplinary therapy in a comprehensive inpatient rehab setting. Physiatrist is providing close team supervision and 24 hour management of active medical problems listed below. Physiatrist and rehab team continue to assess barriers to discharge/monitor patient progress toward functional and medical goals  Care Tool:  Bathing    Body parts bathed by patient: Right arm, Left arm, Chest, Abdomen, Front perineal area, Right upper leg, Buttocks, Left upper leg, Right lower leg, Left lower leg, Face         Bathing assist Assist Level: Moderate Assistance - Patient 50 -  74%     Upper Body Dressing/Undressing Upper body dressing   What is the patient wearing?: Pull over shirt    Upper body assist Assist Level: Minimal Assistance - Patient > 75%    Lower Body Dressing/Undressing Lower body dressing      What is the patient wearing?: Pants, Underwear/pull up     Lower body assist Assist for lower body dressing: Moderate Assistance - Patient 50 - 74%     Toileting Toileting    Toileting assist Assist for toileting: Minimal Assistance - Patient > 75%     Transfers Chair/bed transfer  Transfers assist     Chair/bed transfer assist level: Minimal Assistance - Patient > 75%     Locomotion Ambulation   Ambulation assist      Assist level: Minimal Assistance - Patient > 75% Assistive device: Walker-rolling Max distance: 150'   Walk 10 feet activity   Assist     Assist level: Moderate Assistance - Patient - 50 - 74% Assistive device: Hand held assist   Walk 50 feet activity   Assist    Assist level: Minimal Assistance - Patient > 75% Assistive device: Walker-rolling    Walk 150 feet activity   Assist    Assist level: Minimal Assistance - Patient > 75% Assistive device: Walker-rolling    Walk 10 feet on uneven surface  activity   Assist     Assist level: Minimal Assistance - Patient > 75% Assistive device: Walker-rolling   Wheelchair     Assist Is the patient using a wheelchair?: Yes Type of Wheelchair: Manual    Wheelchair assist level: Minimal Assistance - Patient > 75% Max wheelchair distance: 150'    Wheelchair 50 feet with 2 turns activity    Assist        Assist Level: Minimal Assistance - Patient > 75%   Wheelchair 150 feet activity     Assist      Assist Level: Minimal Assistance - Patient > 75%   Blood pressure (!) 126/92, pulse 99, temperature 97.6 F (36.4 C), temperature source Oral, resp. rate 18, height 6\' 1"  (1.854 m), weight 90.2 kg, SpO2 95 %.   Medical  Problem List and Plan: 1. Functional deficits secondary to postoperative changes from recent pseudomeningocele repair with worsening ataxia, dysarthria and dysphagia as well as history of AVM resection December 2023..             -patient may shower             -ELOS/Goals: 7-10 days, Mod I PT/OT/SLP             -Admit to CIR -Decadron as indicated 4 MG Q8 HOURS with TAPER STARTING 3/29 to 4/10  2.  Antithrombotics: -DVT/anticoagulation:  Pharmaceutical: Lovenox 40mg  QD             -antiplatelet  therapy: Aspirin 81 mg daily 3. Pain Management: Tylenol as needed, Diazepam 5mg  q8h PRN muscle spasms, Voltaren 1% 2g QID - 3/29: Schedule Tylenol 1000 mg TID, add gabapentin 300 mg QHS for headache and sleep -08/02/22 pt previously on Hydrocodone-APAP 5-325mg  at prior discharge, was still using this, would like to keep; reordered for now, 5-325mg  q6h PRN x7 days, if needed longer then weekday team to discuss; adjusted tylenol to 650mg  TID to avoid overdosing of tylenol; would also prefer gabapentin be scheduled BID rather than PRN  4. Mood/Behavior/Sleep: Zoloft 50 mg daily             -antipsychotic agents: N/A   -3/29: Gabapentin as above, melatonin 5mg  QHS PRN  -08/02/22 scheduled Gabapentin BID and melatonin QHS per request  5. Neuropsych/cognition: This patient is capable of making decisions on his own behalf. 6. Skin/Wound Care: Routine skin checks 7. Dysphagia/Fluids/Electrolytes/Nutrition: Routine in and outs with follow-up chemistries  - 3/29: Cr stable, BUN increased, encourage PO fluids and re-test Monday--ordered weekly starting 08/04/22   - SLP eval pending; thickened liquids will make hydration challenging -08/02/22 takes Vitamin D 2000IU QD and Calcium supplement at home; reordered  8.  Hyperlipidemia.  Crestor 20mg  daily 9.  Rheumatoid arthritis.  Consider resuming home regimen of rituximab, methotrexate and sulfasalazine after steroids completed (4/10).  Currently on Dexamethasone  with taper. -08/02/22 wife concerned with restarting of meds; advised to readdress this once steroids have tapered around 08/13/22   10.  CAD.  Continue low-dose aspirin. Denies CP 11.  Dysphagia. Regular diet with nectar liquids. Follow up SLP 12.  Chronic systolic congestive heart failure.  Monitor for any signs of fluid overload. Daily weight. Currently HOLDING Spironolactone 12.5mg  QD (halved during last hospital stay), Lisinopril 10mg  QD, Metoprolol 25mg  QD  -08/02/22 wt stable, BP stable, continue to monitor Filed Weights   07/31/22 1430 08/01/22 0500 08/02/22 0509  Weight: 91.1 kg 90 kg 90.2 kg   Vitals:   07/31/22 1448 07/31/22 2041 08/01/22 0357 08/01/22 1534  BP: 105/72 111/74 113/66 102/65   08/01/22 1942 08/02/22 0509 08/02/22 1324  BP: 113/71 105/68 (!) 126/92    13.  OSA. Intolerance to CPAP. Counseling on CPAP benefits.  14. Prediabetes. A1C 6.0. Monitor on CBC/CMP 15. Leukocytosis. Likely due to steroids. Stable. Monitor for s/s infection.  16. Macular degeneration: takes Preservision AREDS2 and Systane 0.6% eye drops at home -08/02/22 ordered hospital formulary alternatives- Lacrilube BID and Prosight multivitamin QD 17. R wrist bruising: -08/02/22 wife concerned with bruises to R wrist, appear older/healing, no tenderness or crepitus/deformities, no swelling. Suspect minor pressure related bruise, doubt underlying bony injury, doubt need for xray; likely related to blood thinners; monitor for now  I spent >17mins with patient and his wife reviewing medications, changing orders, explaining care plan, and coordinating care.   LOS: 2 days A FACE TO [place] 08/02/2022, 2:11 PM

## 2022-08-02 NOTE — Progress Notes (Signed)
Physical Therapy Session Note  Patient Details  Name: Christopher Yu MRN: [id number] Date of Birth: February 17, 1961  Today's Date: 08/02/2022 PT Individual Time: 1446-1530 PT Individual Time Calculation (min): 44 min   Short Term Goals: Week 1:  PT Short Term Goal 1 (Week 1): Patient will perform bed/chair transfer with LRAD and CGA PT Short Term Goal 2 (Week 1): Patient will ambulate >29' with LRAD and CGA PT Short Term Goal 3 (Week 1): Patient will ascend/descend > a flight of stairs with R HR and CGA  Skilled Therapeutic Interventions/Progress Updates: Pt presents L sidelying in bed and asleep, but arouses quickly.  Pt rolls to R and transfers supine to to sit w/ supervision.  Pt scoots to EOB and dons shoes w/ set-up.  Pt leans forward to tie shoes, states unable to perform in [place] position.  PT ties 2nd shoe 2/2 neck pain.  Pt performs squat pivot bed > w/c w/ min A and mod cues for safety.  Pt wheeled to dayroom for energy conservation.  Pt transfers sit to stand w/ min A but verbal cues for sequencing to improve independence.  Pt stands w/o UE support to hook horseshoes over BBALL hoop using unilateral and then BUES simultaneously to challenge balance.  Pt requires min to occasional mod for steadying.  Pt performed 3 x 8 blocks of STS w/o UE support.  Pt educated on sequencing to improve independence.  Pt w/ LOB posteriorly.  Pt amb x 150' to room w/ RW and min A w/ occasional list to Right, especially w/ left turns.  Pt amb to bed and transfers sit to supine w/ supervision.  Bed alarm on and all needs in reach, spouse present throughout session.     Therapy Documentation Precautions:  Precautions Precautions: Fall Restrictions Weight Bearing Restrictions: No Other Position/Activity Restrictions: Ataxia, impulsive, impaired problem-solving General:   Vital Signs: Therapy Vitals Temp: 97.6 F (36.4 C) Temp Source: Oral Pulse Rate: 99 Resp: 18 BP: (!) 126/92 Patient  Position (if appropriate): Sitting Oxygen Therapy SpO2: 95 % O2 Device: Room Air Pain:neck pain not quantified. Pain Assessment Pain Scale: 0-10 Pain Score: 0-No pain Pain Type: Surgical pain Pain Location: Neck Pain Orientation: Posterior Pain Descriptors / Indicators: Aching;Sore Pain Frequency: Constant Pain Onset: On-going Patients Stated Pain Goal: 0 Pain Intervention(s): Medication (See eMAR) Mobility:    Therapy/Group: Individual Therapy  Ladoris Gene 08/02/2022, 4:02 PM

## 2022-08-03 DIAGNOSIS — G96198 Other disorders of meninges, not elsewhere classified: Secondary | ICD-10-CM | POA: Diagnosis not present

## 2022-08-03 DIAGNOSIS — G4700 Insomnia, unspecified: Secondary | ICD-10-CM | POA: Diagnosis not present

## 2022-08-03 DIAGNOSIS — G4489 Other headache syndrome: Secondary | ICD-10-CM | POA: Diagnosis not present

## 2022-08-03 MED ORDER — ENSURE ENLIVE PO LIQD
237.0000 mL | Freq: Two times a day (BID) | ORAL | Status: DC
  Administered 2022-08-03 – 2022-08-25 (×36): 237 mL via ORAL

## 2022-08-03 NOTE — IPOC Note (Signed)
Overall Plan of Care [facility]) Patient Details Name: Christopher Yu MRN: [id number] DOB: 31-May-1960  Admitting Diagnosis: Pseudomeningocele  Hospital Problems: Principal Problem:   Pseudomeningocele     Functional Problem List: Nursing Bladder, Bowel, Endurance, Medication Management, Nutrition, Pain, Safety, Motor  PT Balance, Perception, Behavior, Safety, Sensory, Endurance, Motor, Pain  OT Balance, Cognition, Endurance, Motor, Perception, Safety, Vision, Behavior, Pain  SLP Linguistic, Cognition, Nutrition  TR         Basic ADL's: OT Eating, Bathing, Grooming, Toileting, Dressing     Advanced  ADL's: OT       Transfers: PT Bed Mobility, Bed to Chair, Car, Floor, Manufacturing systems engineer, Metallurgist: PT Ambulation, Emergency planning/management officer, Stairs     Additional Impairments: OT Fuctional Use of Upper Extremity  SLP Social Cognition, Swallowing, Communication expression Attention, Awareness  TR      Anticipated Outcomes Item Anticipated Outcome  Self Feeding mod I  Swallowing  Supervision   Basic self-care  mod I/supervision  Toileting  supervision   Bathroom Transfers supevrision  Bowel/Bladder  continent x2 with min assist, resolve constipation  Transfers  Supv with LRAD  Locomotion  Supv with LRAD  Communication  Min A  Cognition  Supervision  Pain  <3  Safety/Judgment  no falls with min assist   Therapy Plan: PT Intensity: Minimum of 1-2 x/day ,45 to 90 minutes PT Frequency: 5 out of 7 days PT Duration Estimated Length of Stay: 14-18 days OT Intensity: Minimum of 1-2 x/day, 45 to 90 minutes OT Frequency: 5 out of 7 days OT Duration/Estimated Length of Stay: 14-18 days SLP Intensity: Minumum of 1-2 x/day, 30 to 90 minutes SLP Frequency: 3 to 5 out of 7 days SLP Duration/Estimated Length of Stay: 14-18 days   Team Interventions: Nursing Interventions Patient/Family Education, Bladder Management, Bowel Management, Disease  Management/Prevention, Pain Management, Medication Management, Dysphagia/Aspiration Precaution Training, Discharge Planning  PT interventions Community reintegration, Ambulation/gait training, DME/adaptive equipment instruction, Neuromuscular re-education, Psychosocial support, Stair training, UE/LE Strength taining/ROM, Wheelchair propulsion/positioning, Training and development officer, Discharge planning, Functional electrical stimulation, Pain management, Therapeutic Activities, UE/LE Coordination activities, Cognitive remediation/compensation, Disease management/prevention, Functional mobility training, Patient/family education, Splinting/orthotics, Therapeutic Exercise, Visual/perceptual remediation/compensation  OT Interventions Training and development officer, Cognitive remediation/compensation, Community reintegration, Discharge planning, Disease mangement/prevention, DME/adaptive equipment instruction, Functional electrical stimulation, Functional mobility training, Neuromuscular re-education, Pain management, Patient/family education, Psychosocial support, Self Care/advanced ADL retraining, Skin care/wound managment, Splinting/orthotics, Therapeutic Activities, Therapeutic Exercise, UE/LE Strength taining/ROM, UE/LE Coordination activities, Visual/perceptual remediation/compensation, Wheelchair propulsion/positioning  SLP Interventions Cognitive remediation/compensation, English as a second language teacher, Dysphagia/aspiration precaution training, Environmental controls, Functional tasks, Internal/external aids, Speech/Language facilitation, Patient/family education, Therapeutic Activities  TR Interventions    SW/CM Interventions Discharge Planning, Psychosocial Support, Patient/Family Education   Barriers to Discharge MD  Medical stability  Nursing Incontinence 2 level, 3 steps to enter, home with spouse, able to live on main level (Has been ambulating with RW since surgery, multiple falls. Was ambulating with RW and  assistance from spouse at all times since recent discharge on 3/22.)  PT Home environment access/layout, Insurance for SNF coverage    OT      SLP      SW       Team Discharge Planning: Destination: PT-Home ,OT- Home , SLP-Home Projected Follow-up: PT-Outpatient PT, OT-  Outpatient OT, SLP-Outpatient SLP, Home Health SLP, 24 hour supervision/assistance Projected Equipment Needs: PT-To be determined, OT- To be determined, SLP-To be determined Equipment Details: PT-TBD, OT-will likely need a shower seat Patient/family involved  in discharge planning: PT- Patient,  OT-Patient, SLP-Patient  MD ELOS: 7-10 days modI Medical Rehab Prognosis:  Excellent Assessment: The patient has been admitted for CIR therapies with the diagnosis of pseudomeningocele.The team will be addressing functional mobility, strength, stamina, balance, safety, adaptive techniques and equipment, self-care, bowel and bladder mgt, patient and caregiver education. Goals have been set at [facility]. Anticipated discharge destination is home.        See Team Conference Notes for weekly updates to the plan of care

## 2022-08-03 NOTE — Progress Notes (Signed)
PROGRESS NOTE   Subjective/Complaints:  Slept GREAT last night, and is very thankful for that! Pain better controlled today, although he doesn't like the taste of crushed meds.  LBM last night.  Urinating fine.  Denies any other complaints or concerns.   ROS: +HAs-improving, +insomnia-improved, +bruises R wrist but no pain. Denies fevers, chills, N/V, abdominal pain, constipation, diarrhea, SOB, cough, chest pain, new weakness or paraesthesias.    Objective:   No results found. Recent Labs    08/01/22 0917  WBC 11.0*  HGB 10.6*  HCT 32.7*  PLT 241   Recent Labs    08/01/22 0917  NA 140  K 3.5  CL 102  CO2 26  GLUCOSE 149*  BUN 29*  CREATININE 0.93  CALCIUM 9.1    Intake/Output Summary (Last 24 hours) at 08/03/2022 1137 Last data filed at 08/03/2022 0852 Gross per 24 hour  Intake 956 ml  Output 1100 ml  Net -144 ml        Physical Exam: Vital Signs Blood pressure 105/65, pulse 74, temperature 97.7 F (36.5 C), temperature source Oral, resp. rate 16, height 6\' 1"  (1.854 m), weight 91 kg, SpO2 96 %.    General: NAD, sitting up in bed HEENT: Head with occipital swelling noted, fluctuant, nontender, no warmth--not reassessed today PERRLA, EOMI, sclera anicteric, oral mucosa pink and moist  Neck: Supple without JVD Heart: Reg rate and rhythm. No murmurs rubs or gallops Chest: CTA bilaterally without wheezes, rales, or rhonchi; no distress Abdomen: Soft, non-tender, non-distended, bowel sounds positive. Extremities: No clubbing, cyanosis, or edema. Pulses are 2+ Psych: Pt's affect is appropriate. Pt is cooperative. Happier Skin: Clean and intact without signs of breakdown, healed incision/scar to posterior neck Neuro: Speech apraxia and dysarthria MsK: small bruise to R wrist over ulnar styloid without tenderness or crepitus, FROM intact, small bruise over [facility] joint as well, also with FROM intact and  without tenderness or crepitus-stable  PRIOR EXAM: Neuro: Alert and oriented x 4, Strength 5 out of 5 in bilateral upper extremities Strength 4+ out of 5 in proximal lower extremities, 5 out of 5 in distal lower extremities Sensation intact light touch in all 4 extremities + Speech apraxia and mild word confusion +dysarthria + FTN slightly altered L>R HTS b/l slightly slow to complete Musculoskeletal: Normal bulk and tone, no joint swelling noted Healed right TKA incision   Assessment/Plan: 1. Functional deficits which require 3+ hours per day of interdisciplinary therapy in a comprehensive inpatient rehab setting. Physiatrist is providing close team supervision and 24 hour management of active medical problems listed below. Physiatrist and rehab team continue to assess barriers to discharge/monitor patient progress toward functional and medical goals  Care Tool:  Bathing    Body parts bathed by patient: Right arm, Left arm, Chest, Abdomen, Front perineal area, Right upper leg, Buttocks, Left upper leg, Right lower leg, Left lower leg, Face         Bathing assist Assist Level: Moderate Assistance - Patient 50 - 74%     Upper Body Dressing/Undressing Upper body dressing   What is the patient wearing?: Pull over shirt    Upper body assist Assist Level: Minimal  Assistance - Patient > 75%    Lower Body Dressing/Undressing Lower body dressing      What is the patient wearing?: Pants, Underwear/pull up     Lower body assist Assist for lower body dressing: Moderate Assistance - Patient 50 - 74%     Toileting Toileting    Toileting assist Assist for toileting: Minimal Assistance - Patient > 75%     Transfers Chair/bed transfer  Transfers assist     Chair/bed transfer assist level: Minimal Assistance - Patient > 75%     Locomotion Ambulation   Ambulation assist      Assist level: Minimal Assistance - Patient > 75% Assistive device: Walker-rolling Max  distance: 150   Walk 10 feet activity   Assist     Assist level: Minimal Assistance - Patient > 75% Assistive device: Walker-rolling   Walk 50 feet activity   Assist    Assist level: Minimal Assistance - Patient > 75% Assistive device: Walker-rolling    Walk 150 feet activity   Assist    Assist level: Minimal Assistance - Patient > 75% Assistive device: Walker-rolling    Walk 10 feet on uneven surface  activity   Assist     Assist level: Minimal Assistance - Patient > 75% Assistive device: Walker-rolling   Wheelchair     Assist Is the patient using a wheelchair?: Yes Type of Wheelchair: Manual    Wheelchair assist level: Minimal Assistance - Patient > 75% Max wheelchair distance: 150'    Wheelchair 50 feet with 2 turns activity    Assist        Assist Level: Minimal Assistance - Patient > 75%   Wheelchair 150 feet activity     Assist      Assist Level: Minimal Assistance - Patient > 75%   Blood pressure 105/65, pulse 74, temperature 97.7 F (36.5 C), temperature source Oral, resp. rate 16, height 6\' 1"  (1.854 m), weight 91 kg, SpO2 96 %.   Medical Problem List and Plan: 1. Functional deficits secondary to postoperative changes from recent pseudomeningocele repair with worsening ataxia, dysarthria and dysphagia as well as history of AVM resection December 2023..             -patient may shower             -ELOS/Goals: 7-10 days, Mod I PT/OT/SLP             -Admit to CIR -Decadron as indicated 4 MG Q8 HOURS with TAPER STARTING 3/29 to 4/10  2.  Antithrombotics: -DVT/anticoagulation:  Pharmaceutical: Lovenox 40mg  QD             -antiplatelet therapy: Aspirin 81 mg daily 3. Pain Management: Tylenol as needed, Diazepam 5mg  q8h PRN muscle spasms, Voltaren 1% 2g QID - 3/29: Schedule Tylenol 1000 mg TID, add gabapentin 300 mg QHS for headache and sleep -08/02/22 pt previously on Hydrocodone-APAP 5-325mg  at prior discharge, was still  using this, would like to keep; reordered for now, 5-325mg  q6h PRN x7 days, if needed longer then weekday team to discuss; adjusted tylenol to 650mg  TID to avoid overdosing of tylenol; would also prefer gabapentin be scheduled BID rather than PRN -08/03/22 improved control, cont regimen for now  4. Mood/Behavior/Sleep: Zoloft 50 mg daily             -antipsychotic agents: N/A   -3/29: Gabapentin as above, melatonin 5mg  QHS PRN  -08/02/22 scheduled Gabapentin BID and melatonin QHS per request  -08/03/22 greatly improved sleep, cont  regimen  5. Neuropsych/cognition: This patient is capable of making decisions on his own behalf. 6. Skin/Wound Care: Routine skin checks 7. Dysphagia/Fluids/Electrolytes/Nutrition: Routine in and outs with follow-up chemistries  - 3/29: Cr stable, BUN increased, encourage PO fluids and re-test Monday--ordered weekly starting 08/04/22   - SLP eval pending; thickened liquids will make hydration challenging -08/02/22 takes Vitamin D 2000IU QD and Calcium supplement at home; reordered  8.  Hyperlipidemia.  Crestor 20mg  daily 9.  Rheumatoid arthritis.  Consider resuming home regimen of rituximab, methotrexate and sulfasalazine after steroids completed (4/10).  Currently on Dexamethasone with taper. -08/02/22 wife concerned with restarting of meds; advised to readdress this once steroids have tapered around 08/13/22   10.  CAD.  Continue low-dose aspirin. Denies CP 11.  Dysphagia. Regular diet with nectar liquids. Follow up SLP 12.  Chronic systolic congestive heart failure.  Monitor for any signs of fluid overload. Daily weight. Currently HOLDING Spironolactone 12.5mg  QD (halved during last hospital stay), Lisinopril 10mg  QD, Metoprolol 25mg  QD  -3/30-31/24 wt stable, BP stable, continue to monitor Filed Weights   08/01/22 0500 08/02/22 0509 08/03/22 0554  Weight: 90 kg 90.2 kg 91 kg   Vitals:   07/31/22 1448 07/31/22 2041 08/01/22 0357 08/01/22 1534  BP: 105/72 111/74  113/66 102/65   08/01/22 1942 08/02/22 0509 08/02/22 1324 08/02/22 1922  BP: 113/71 105/68 (!) 126/92 99/67   08/03/22 0538  BP: 105/65    13.  OSA. Intolerance to CPAP. Counseling on CPAP benefits.  14. Prediabetes. A1C 6.0. Monitor on CBC/CMP 15. Leukocytosis. Likely due to steroids. Stable. Monitor for s/s infection.  16. Macular degeneration: takes Preservision AREDS2 and Systane 0.6% eye drops at home -08/02/22 ordered hospital formulary alternatives- Lacrilube BID and Prosight multivitamin QD 17. R wrist bruising: -08/02/22 wife concerned with bruises to R wrist, appear older/healing, no tenderness or crepitus/deformities, no swelling. Suspect minor pressure related bruise, doubt underlying bony injury, doubt need for xray; likely related to blood thinners; monitor for now    LOS: 3 days A FACE TO [place] 08/03/2022, 11:37 AM

## 2022-08-04 DIAGNOSIS — G96198 Other disorders of meninges, not elsewhere classified: Secondary | ICD-10-CM | POA: Diagnosis not present

## 2022-08-04 LAB — CBC
HCT: 31.5 % — ABNORMAL LOW (ref 39.0–52.0)
Hemoglobin: 9.8 g/dL — ABNORMAL LOW (ref 13.0–17.0)
MCH: 23.4 pg — ABNORMAL LOW (ref 26.0–34.0)
MCHC: 31.1 g/dL (ref 30.0–36.0)
MCV: 75.4 fL — ABNORMAL LOW (ref 80.0–100.0)
Platelets: 234 10*3/uL (ref 150–400)
RBC: 4.18 MIL/uL — ABNORMAL LOW (ref 4.22–5.81)
RDW: 19.7 % — ABNORMAL HIGH (ref 11.5–15.5)
WBC: 10.6 10*3/uL — ABNORMAL HIGH (ref 4.0–10.5)
nRBC: 0 % (ref 0.0–0.2)

## 2022-08-04 LAB — BASIC METABOLIC PANEL
Anion gap: 9 (ref 5–15)
BUN: 18 mg/dL (ref 8–23)
CO2: 29 mmol/L (ref 22–32)
Calcium: 8.8 mg/dL — ABNORMAL LOW (ref 8.9–10.3)
Chloride: 103 mmol/L (ref 98–111)
Creatinine, Ser: 0.79 mg/dL (ref 0.61–1.24)
GFR, Estimated: >60 mL/min (ref >60)
Glucose, Bld: 122 mg/dL — ABNORMAL HIGH (ref 70–99)
Potassium: 4.1 mmol/L (ref 3.5–5.1)
Sodium: 141 mmol/L (ref 135–145)

## 2022-08-04 NOTE — Progress Notes (Signed)
Physical Therapy Session Note  Patient Details  Name: Christopher Yu MRN: [id number] Date of Birth: Dec 17, 1960  Today's Date: 08/04/2022 PT Individual Time: 0800-0915 PT Individual Time Calculation (min): 75 min   Short Term Goals: Week 1:  PT Short Term Goal 1 (Week 1): Patient will perform bed/chair transfer with LRAD and CGA PT Short Term Goal 2 (Week 1): Patient will ambulate >47' with LRAD and CGA PT Short Term Goal 3 (Week 1): Patient will ascend/descend > a flight of stairs with R HR and CGA  Skilled Therapeutic Interventions/Progress Updates:  Patient greeted sitting upright in wheelchair in room and agreeable to PT treatment session. Therapist assisted with donning socks and shoes for time management. Patient impulsively stood from whelechair prior to therapist being nearby or having RW in front of him- Educated regarding improved safety awareness. Patient gait trained >171' form his room to day room with RW and CGA/MinA for safety/stability. Patient continues to demonstrate R lateral list with x2 instances of posterior LOB requiring assistance for improved stability secondary to poor righting reactions.   Patient performed x10 sit/stands from EOM without the use of an AD or UE support and CGA- Patient provided with education regarding increased anterior weight shift and scooting forward prior to standing secondary to notable posterior bias. Patient with x1 significant posterior LOB and x1 anterior LOB requiring steadying assistance from therapist.   Neuro Re-Ed -Patient stood with B UE support while performing alternating foot taps to 4" step with CGA for safety- Patient performed x20 total with increased R lateral lean during L stance phase.  -Same activity performed as above, however colored dots were placed on the step for external visual cues regarding foot placement, x20 performed in total.  -Same activity performed as above, however patient used only a S UE, x20 with  R UE support and x20 with L UE with a seated rest break in between. VC for improved midline posturing and decreased cadence for improved overall control with activity. Increased difficulty with balance and midline posturing when using L UE for support vs R UE.  -Patient stood with B UE support and performed toe taps to 4 various colored cones- x20 on each LE with CGA for safety. Therapist used one to 4 step commands with 95% accuracy.  -Patient performed x10 sit/stands with 2" block under R foot in order to facilitate forced use of L LE throughout stand and improve overall midline posturing with mirror in front for external visual cues. Patient performed without UE use and Min/ModA at times for improved stability- Improved ability and form with increased repetition.   Patient gait trained >171' back to his room with RW and CGA/MinA- VC throughout for improved postural extension, forward gaze and staying within the middle on the walker.   Patient left sitting upright in wheelchair in room with posey belt on, call bell within reach, tray table in front and all needs met.    Therapy Documentation Precautions:  Precautions Precautions: Fall Restrictions Weight Bearing Restrictions: No Other Position/Activity Restrictions: Ataxia, impulsive, impaired problem-solving   Therapy/Group: Individual Therapy  Christopher Yu 08/04/2022, 7:49 AM

## 2022-08-04 NOTE — Care Management (Signed)
[facility] Individual Statement of Services  Patient Name:  Christopher Yu  Date:  08/04/2022  Welcome to the [facility].  Our goal is to provide you with an individualized program based on your diagnosis and situation, designed to meet your specific needs.  With this comprehensive rehabilitation program, you will be expected to participate in at least 3 hours of rehabilitation therapies Monday-Friday, with modified therapy programming on the weekends.  Your rehabilitation program will include the following services:  Physical Therapy (PT), Occupational Therapy (OT), Speech Therapy (ST), 24 hour per day rehabilitation nursing, Therapeutic Recreaction (TR), Psychology, Neuropsychology, Care Coordinator, Rehabilitation Medicine, [place], and Other  Weekly team conferences will be held on Tuesdays to discuss your progress.  Your Inpatient Rehabilitation Care Coordinator will talk with you frequently to get your input and to update you on team discussions.  Team conferences with you and your family in attendance may also be held.  Expected length of stay: 14-18 days    Overall anticipated outcome: Supervision  Depending on your progress and recovery, your program may change. Your Inpatient Rehabilitation Care Coordinator will coordinate services and will keep you informed of any changes. Your Inpatient Rehabilitation Care Coordinator's name and contact numbers are listed  below.  The following services may also be recommended but are not provided by the [facility] will be made to provide these services after discharge if needed.  Arrangements include referral to agencies that provide these services.  Your insurance has been verified to be:  [employer]  Your primary  doctor is:  Rochele Raring  Pertinent information will be shared with your doctor and your insurance company.  Inpatient Rehabilitation Care Coordinator:  Cathleen Corti [phone] or ([phone]  Information discussed with and copy given to patient by: Rana Snare, 08/04/2022, 9:17 AM

## 2022-08-04 NOTE — Progress Notes (Signed)
Inpatient Rehabilitation Care Coordinator Assessment and Plan Patient Details  Name: Christopher Yu MRN: [id number] Date of Birth: [birth year]  Today's Date: 08/04/2022  Hospital Problems: Principal Problem:   Pseudomeningocele  Past Medical History:  Past Medical History:  Diagnosis Date   Anxiety    Arthritis, rheumatoid ([place])    dx 1988   CAD (coronary artery disease), native coronary artery    Mild LAD disease with calcification noted at cath 12//27/16    Cardiomyopathy ([place])    Cardiomyopathy ([place])    Chronic systolic heart failure ([facility]) 05/01/2015   Depression    Deviated nasal septum 06/25/2011   Headache    High cholesterol    Hyperlipidemia    Impingement syndrome of left shoulder 12/22/2017   Macular degeneration    Rheumatoid aortitis    Sleep apnea    does not use cpap - UNABLE TO TOLERATE MASK   Sleep apnea in adult    deviated septum repaired, most recent sleep study was negative   Status post total right knee replacement 06/01/2015   Past Surgical History:  Past Surgical History:  Procedure Laterality Date   ANKLE FUSION Right 05/05/2012   related to his arthritis   ANKLE FUSION Left 05/25/2013   related to his arthritis   ANKLE SURGERY Bilateral    APPLICATION OF CRANIAL NAVIGATION N/A 04/11/2022   Procedure: APPLICATION OF CRANIAL NAVIGATION;  Surgeon: Consuella Lose, MD;  Location: [facility];  Service: Neurosurgery;  Laterality: N/A;   CARDIAC CATHETERIZATION N/A 05/01/2015   Procedure: Left Heart Cath and Coronary Angiography;  Surgeon: Belva Crome, MD;  Location: [facility];  Service: Cardiovascular;  Laterality: N/A;   CRANIOTOMY N/A 04/11/2022   Procedure: STEREOTACTIC SUBOCCIPITAL CRANIOTOMY FOR RESECTION OF ARTERIO-VENOUS MALFORMATION;  Surgeon: Consuella Lose, MD;  Location: [facility];  Service: Neurosurgery;  Laterality: N/A;   FRACTURE SURGERY     left femur fracture x 3   IR ANGIO EXTERNAL CAROTID SEL EXT CAROTID  BILAT MOD SED  01/28/2022   IR ANGIO INTRA EXTRACRAN SEL INTERNAL CAROTID BILAT MOD SED  01/28/2022   IR ANGIO INTRA EXTRACRAN SEL INTERNAL CAROTID BILAT MOD SED  04/16/2022   IR ANGIO VERTEBRAL SEL VERTEBRAL BILAT MOD SED  01/28/2022   IR ANGIO VERTEBRAL SEL VERTEBRAL BILAT MOD SED  04/16/2022   KNEE ARTHROSCOPY     right x 4   LUMBAR LAMINECTOMY/DECOMPRESSION MICRODISCECTOMY N/A 07/18/2022   Procedure: Repair of Pseudomeningiocele posterior;  Surgeon: Consuella Lose, MD;  Location: [facility];  Service: Neurosurgery;  Laterality: N/A;   NASAL SEPTOPLASTY W/ TURBINOPLASTY  06/25/2011   Procedure: NASAL SEPTOPLASTY WITH TURBINATE REDUCTION;  Surgeon: Jerrell Belfast, MD;  Location: [facility];  Service: ENT;  Laterality: Bilateral;   PLACEMENT OF LUMBAR DRAIN N/A 07/18/2022   Procedure: PLACEMENT OF LUMBAR DRAIN;  Surgeon: Consuella Lose, MD;  Location: [facility];  Service: Neurosurgery;  Laterality: N/A;   TONSILLECTOMY     TOTAL KNEE ARTHROPLASTY Right 06/01/2015   Procedure: RIGHT TOTAL KNEE ARTHROPLASTY;  Surgeon: Mcarthur Rossetti, MD;  Location: [facility];  Service: Orthopedics;  Laterality: Right;  Block+general   Social History:  reports that he quit smoking about 14 years ago. His smoking use included cigarettes. He has a 10.00 pack-year smoking history. He has never used smokeless tobacco. He reports current alcohol use. He reports that he does not use drugs.  Family / Support Systems Marital Status: Married How Long?: 20 years Patient Roles: Spouse, Parent Spouse/Significant Other: Kathlee Nations (  wife) [phone] Children: two teenage daughters- chloe (10) and anna (16) Other Supports: none reported Anticipated Caregiver: Wife Ability/Limitations of Caregiver: Wife will be primary caregiver. Caregiver Availability: 24/7 Family Dynamics: Pt lives with his wife, and their two daughters.  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 Wife reports all FMLA forms have been completed Legal History/Current Legal Issues: Denies Guardian/Conservator: No HCPOA in place.   Abuse/Neglect Abuse/Neglect Assessment Can Be Completed: Yes Physical Abuse: Denies Verbal Abuse: Denies Sexual Abuse: Denies Exploitation of patient/patient's resources: Denies Self-Neglect: Denies  Patient response to: Social Isolation - How often do you feel lonely or isolated from those around you?: Never  Emotional Status Pt's affect, behavior and adjustment status: Pt in good spirits at time of visit Recent Psychosocial Issues: Denies Psychiatric History: Denies Substance Abuse History: Denies  Patient / Family Perceptions, Expectations & Goals Pt/Family understanding of illness & functional limitations: Pt and wife have  a general understanding of care needs Premorbid pt/family roles/activities: Independent Anticipated changes in roles/activities/participation: Assistance with ADLs.IADLs Pt/family expectations/goals: pt goal is to work on speech, so he can speak nore consicely, walking again, and taking care of himself as he was before.  Community Resources [facility] Scripts: None Premorbid Home Care/DME Agencies: None Transportation available at discharge: Wife Is the patient able to respond to transportation needs?: Yes In the past 12 months, has lack of transportation kept you from medical appointments or from getting medications?: No In the past 12 months, has lack of transportation kept you from meetings, work, or from getting things needed for daily  living?: No Resource referrals recommended: Neuropsychology  Discharge Planning Living Arrangements: Spouse/significant other, Children Support Systems: Children, Spouse/significant other Type of Residence: Private residence Insurance Resources: Multimedia programmer (specify) Nurse, mental health) Financial Resources: Employment Museum/gallery curator Screen Referred: No Living Expenses: Medical laboratory scientific officer Management: Patient, Spouse Does the patient have any problems obtaining your medications?: No Home Management: Pt reports everyone in the home helps manage homecare needs. Patient/Family Preliminary Plans: No changes Care Coordinator Barriers to Discharge: Decreased caregiver support, Lack of/limited family support, Insurance for SNF coverage Care Coordinator Anticipated Follow Up Needs: HH/OP Expected length of stay: 14-18 days  Clinical Impression SW met with pt in room to introduce self, explain role, discuss discharge process, and inform on ELOS. No HCPOA. Pt is not a English as a second language teacher. No DME.   Makaylen Thieme A Hason Ofarrell 08/04/2022, 1:39 PM

## 2022-08-04 NOTE — Progress Notes (Signed)
Occupational Therapy Session Note  Patient Details  Name: Christopher Yu MRN: [id number] Date of Birth: 1960/06/20  Today's Date: 08/04/2022 OT Individual Time: 1300-1415 OT Individual Time Calculation (min): 75 min    Short Term Goals: Week 1:  OT Short Term Goal 1 (Week 1): Patient will maintain standing balance at the sink with CGA within functional BADL task OT Short Term Goal 2 (Week 1): Patient will complete LB dressing with min A OT Short Term Goal 3 (Week 1): Patient will tolerate standing for 3 minutes in preparation for BADL tasks  Skilled Therapeutic Interventions/Progress Updates:    Pt greeted semi-reclined in bed awake and agreeable to OT treatment session. Pt agreeable to shower today. Pt ambulated with RW to the bathroom with min A. Pt stood ot doff clothing with min A for balance with lateral lean to the L. Bathing completed sit<>stand from shower seat with min A and cues for safety. Focus on forced use of L UE. Pt overshooting with L hand and frequently dropping wash cloth. Pt transitioned out of shower with min A stand-pivot. Dressing tasks completed from wc at the sink with education provided for hemi dressing techniques. Pt needed min A for balance when standing to pull up pants. Standing balance/endurance with standing grooming tasks at the sink and CGA/min A for balance. B UE strength/coordination with wc propsulsion to therapy gym. OT issued soft yellow theraputty and beads. Addressed L hand strength and coordination with theraputty exercises and bead finding activity in putty. Focus on providing appropriate amount of pressure to make ball and logs with putty. Small peg board activity with focus on in-hand manipulation, translation, and rotation of small pegs. Pt with difficulty translating small pegs from palm, so graded task to only collecting one bead at a time to provide just right challenge. Tried using the tweezers to remove pegs with focus on pinch and FMC.  Pt ambulated back to room with RW and min A with 1 LOB requiring mod A to correct. Pt returned to room and left seated in recliner with alarm belt on, call bell in reach and needs met.   Therapy Documentation Precautions:  Precautions Precautions: Fall Restrictions Weight Bearing Restrictions: No Other Position/Activity Restrictions: Ataxia, impulsive, impaired problem-solving Pain:  Denies pain   Therapy/Group: Individual Therapy  Valma Cava 08/04/2022, 1:44 PM

## 2022-08-04 NOTE — Progress Notes (Signed)
Speech Language Pathology Daily Session Note  Patient Details  Name: Christopher Yu MRN: [id number] Date of Birth: 30-Aug-1960  Today's Date: 08/04/2022 SLP Individual Time: 1015-1100 SLP Individual Time Calculation (min): 45 min  Short Term Goals: Week 1: SLP Short Term Goal 1 (Week 1): Patient will consume curent diet with Min verbal cues for use of swallowing compensatory strategies. SLP Short Term Goal 2 (Week 1): Patient will utilize speech intelligibility strategies at the phrase level to achieve ~90% intelligibility with Min verbal cues. SLP Short Term Goal 3 (Week 1): Patient will demonstrate sustained attention to functional tasks for 30 minutes with  min verbal cues for redirection. SLP Short Term Goal 4 (Week 1): Patient will self-monitor and correct communication breakdowns with Mod verbal cues. SLP Short Term Goal 5 (Week 1): Patient will self-monitor and correct wet vocal quality with mod verbal cues.  Skilled Therapeutic Interventions: Skilled treatment session focused on communication goals. Upon arrival, patient was awake while upright in the wheelchair and agreeable to treatment session.  SLP facilitated session by providing overall Mod A verbal and visual cues for use of diaphragmatic breathing at rest while siting upright. Patient also able to utilize diaphragmatic breathing while sustaining individual phonemes and at the simple word level with Mod verbal cues. Patient with increased vocal intensity during exercises. However, patient unable to carryover diaphragmatic breathing during functional conversation with Mod verbal cues needed to self-monitor and correct communication breakdown. Patient given handout for utilization and practice throughout the day. Patient left upright in wheelchair with alarm on and all needs within reach. Continue with current plan of care.      Pain Pain Assessment Pain Scale: 0-10 Pain Score: 9  Pain Type: Acute pain Pain Location:  Neck Pain Descriptors / Indicators: Aching Pain Frequency: Intermittent Pain Onset: Gradual  Therapy/Group: Individual Therapy  Teryl Gubler 08/04/2022, 1:18 PM

## 2022-08-04 NOTE — Progress Notes (Signed)
Patient ID: Christopher Yu, male   DOB: 1961/04/21, 62 y.o.   MRN: [id number]  SW spoke with pt wife to inform on ELOS, and will follow-up with updates from team conference.   Loralee Pacas, MSW, [place] Office: [phone] Cell: [phone] Fax: [phone]

## 2022-08-04 NOTE — Progress Notes (Signed)
PROGRESS NOTE   Subjective/Complaints:  No acute complaints. No events overnight. Inquiring about hospital bed for home to improve bed mobility; discussed determination of home equipment closer to discharge.   ROS: +HAs-none, +insomnia-resolved, +bruises R wrist  - stable. Denies fevers, chills, N/V, abdominal pain, constipation, diarrhea, SOB, cough, chest pain, new weakness or paraesthesias.    Objective:   No results found. Recent Labs    08/04/22 0447  WBC 10.6*  HGB 9.8*  HCT 31.5*  PLT 234    Recent Labs    08/04/22 0447  NA 141  K 4.1  CL 103  CO2 29  GLUCOSE 122*  BUN 18  CREATININE 0.79  CALCIUM 8.8*     Intake/Output Summary (Last 24 hours) at 08/04/2022 2137 Last data filed at 08/04/2022 1745 Gross per 24 hour  Intake 590 ml  Output 1250 ml  Net -660 ml         Physical Exam: Vital Signs Blood pressure 107/73, pulse 81, temperature 98.7 F (37.1 C), resp. rate 19, height 6\' 1"  (1.854 m), weight 91 kg, SpO2 97 %.    General: NAD, sitting up in bed HEENT: Head with occipital swelling noted, fluctuant, nontender, no warmth - unchanged.  PERRLA Neck: Supple without JVD Heart: Reg rate and rhythm. No murmurs rubs or gallops Chest: CTA bilaterally without wheezes, rales, or rhonchi; no distress Abdomen: Soft, non-tender, non-distended, bowel sounds positive. Extremities: No clubbing, cyanosis, or edema.   Psych: Pt's affect is appropriate. Pt is cooperative.   Skin: Clean and intact without signs of breakdown, healed incision/scar to posterior neck MsK: small bruise to R wrist over ulnar styloid - stable Neuro: Alert and oriented x 4,  + Speech apraxia and dysarthria; ongoing Strength 4+ out of 5 in proximal lower extremities Otherwise 5/5 throughout Sensation intact light touch in all 4 extremities + FTN slightly altered L>R HTS b/l slightly slow to complete Musculoskeletal: Normal bulk  and tone, no joint swelling noted Healed right TKA incision   Assessment/Plan: 1. Functional deficits which require 3+ hours per day of interdisciplinary therapy in a comprehensive inpatient rehab setting. Physiatrist is providing close team supervision and 24 hour management of active medical problems listed below. Physiatrist and rehab team continue to assess barriers to discharge/monitor patient progress toward functional and medical goals  Care Tool:  Bathing    Body parts bathed by patient: Right arm, Left arm, Chest, Abdomen, Front perineal area, Right upper leg, Buttocks, Left upper leg, Right lower leg, Left lower leg, Face         Bathing assist Assist Level: Moderate Assistance - Patient 50 - 74%     Upper Body Dressing/Undressing Upper body dressing   What is the patient wearing?: Pull over shirt    Upper body assist Assist Level: Minimal Assistance - Patient > 75%    Lower Body Dressing/Undressing Lower body dressing      What is the patient wearing?: Pants, Underwear/pull up     Lower body assist Assist for lower body dressing: Moderate Assistance - Patient 50 - 74%     Toileting Toileting    Toileting assist Assist for toileting: Minimal Assistance - Patient >  75%     Transfers Chair/bed transfer  Transfers assist     Chair/bed transfer assist level: Minimal Assistance - Patient > 75%     Locomotion Ambulation   Ambulation assist      Assist level: Minimal Assistance - Patient > 75% Assistive device: Walker-rolling Max distance: 150   Walk 10 feet activity   Assist     Assist level: Minimal Assistance - Patient > 75% Assistive device: Walker-rolling   Walk 50 feet activity   Assist    Assist level: Minimal Assistance - Patient > 75% Assistive device: Walker-rolling    Walk 150 feet activity   Assist    Assist level: Minimal Assistance - Patient > 75% Assistive device: Walker-rolling    Walk 10 feet on uneven  surface  activity   Assist     Assist level: Minimal Assistance - Patient > 75% Assistive device: Walker-rolling   Wheelchair     Assist Is the patient using a wheelchair?: Yes Type of Wheelchair: Manual    Wheelchair assist level: Minimal Assistance - Patient > 75% Max wheelchair distance: 150'    Wheelchair 50 feet with 2 turns activity    Assist        Assist Level: Minimal Assistance - Patient > 75%   Wheelchair 150 feet activity     Assist      Assist Level: Minimal Assistance - Patient > 75%   Blood pressure 107/73, pulse 81, temperature 98.7 F (37.1 C), resp. rate 19, height 6\' 1"  (1.854 m), weight 91 kg, SpO2 97 %.   Medical Problem List and Plan: 1. Functional deficits secondary to postoperative changes from recent pseudomeningocele repair with worsening ataxia, dysarthria and dysphagia as well as history of AVM resection December 2023..             -patient may shower             -ELOS/Goals: 7-10 days, Mod I PT/OT/SLP             -Admit to CIR -Decadron as indicated 4 MG Q8 HOURS with TAPER STARTING 3/29 to 4/10  2.  Antithrombotics: -DVT/anticoagulation:  Pharmaceutical: Lovenox 40mg  QD             -antiplatelet therapy: Aspirin 81 mg daily  3. Pain Management: Tylenol as needed, Diazepam 5mg  q8h PRN muscle spasms, Voltaren 1% 2g QID - 3/29: Schedule Tylenol 1000 mg TID, add gabapentin 300 mg QHS for headache and sleep -08/02/22 pt previously on Hydrocodone-APAP 5-325mg  at prior discharge, was still using this, would like to keep; reordered for now, 5-325mg  q6h PRN x7 days, if needed longer then weekday team to discuss; adjusted tylenol to 650mg  TID to avoid overdosing of tylenol; would also prefer gabapentin be scheduled BID rather than PRN -08/03/22 - 4/1 improved control, cont regimen for now  4. Mood/Behavior/Sleep: Zoloft 50 mg daily             -antipsychotic agents: N/A   -3/29: Gabapentin as above, melatonin 5mg  QHS PRN  -08/02/22  scheduled Gabapentin BID and melatonin QHS per request  -08/03/22 greatly improved sleep - continue  5. Neuropsych/cognition: This patient is capable of making decisions on his own behalf. 6. Skin/Wound Care: Routine skin checks 7. Dysphagia/Fluids/Electrolytes/Nutrition: Routine in and outs with follow-up chemistries  - 3/29: Cr stable, BUN increased, encourage PO fluids and re-test Monday--ordered weekly; stable CR, BUN improved  4/1 -08/02/22 takes Vitamin D 2000IU QD and Calcium supplement at home; reordered  8.  Hyperlipidemia.  Crestor 20mg  daily 9.  Rheumatoid arthritis.  Consider resuming home regimen of rituximab, methotrexate and sulfasalazine after steroids completed (4/10).  Currently on Dexamethasone with taper. -08/02/22 wife concerned with restarting of meds; advised to readdress this once steroids have tapered around 08/13/22   10.  CAD.  Continue low-dose aspirin. Denies CP 11.  Dysphagia. Regular diet with nectar liquids. Follow up SLP 12.  Chronic systolic congestive heart failure.  Monitor for any signs of fluid overload. Daily weight. Currently HOLDING Spironolactone 12.5mg  QD (halved during last hospital stay), Lisinopril 10mg  QD, Metoprolol 25mg  QD  -3/30-4/2 wt stable, BP stable, continue to monitor [facility] Weights   08/02/22 0509 08/03/22 0554 08/04/22 0449  Weight: 90.2 kg 91 kg 91 kg   Vitals:   08/01/22 1534 08/01/22 1942 08/02/22 0509 08/02/22 1324  BP: 102/65 113/71 105/68 (!) 126/92   08/02/22 1922 08/03/22 0538 08/03/22 1450 08/03/22 1923  BP: 99/67 105/65 112/79 (!) 123/56   08/03/22 1959 08/04/22 0449 08/04/22 1239 08/04/22 2109  BP: 120/88 (!) 140/75 (!) 132/98 107/73    13.  OSA. Intolerance to CPAP. Counseling on CPAP benefits.  14. Prediabetes. A1C 6.0. Monitor on CBC/CMP 15. Leukocytosis. Likely due to steroids. Stable. Monitor for s/s infection.    - 4/12: 10.6; monitor  16. Macular degeneration: takes Preservision AREDS2 and Systane 0.6% eye drops  at home -08/02/22 ordered hospital formulary alternatives- Lacrilube BID and Prosight multivitamin QD - 4/1: Wife concerned regarding use of alternative to systane; recommended bringing in from home   17. R wrist bruising - stable -08/02/22 wife concerned with bruises to R wrist, appear older/healing, no tenderness or crepitus/deformities, no swelling. Suspect minor pressure related bruise, doubt underlying bony injury, doubt need for xray; likely related to blood thinners; monitor for now  - 4/12: Mild HgB drop 10.6->9.8; repeat h/h is suspicious for enlarging hematoma or melena. Currently within normal limits for lab variability.     LOS: 4 days A FACE TO FACE EVALUATION WAS [facility] 08/04/2022, 9:37 PM

## 2022-08-05 DIAGNOSIS — F4320 Adjustment disorder, unspecified: Secondary | ICD-10-CM

## 2022-08-05 DIAGNOSIS — G96198 Other disorders of meninges, not elsewhere classified: Secondary | ICD-10-CM | POA: Diagnosis not present

## 2022-08-05 MED ORDER — POLYVINYL ALCOHOL 1.4 % OP SOLN
1.0000 [drp] | OPHTHALMIC | Status: DC | PRN
  Administered 2022-08-05 – 2022-08-07 (×5): 1 [drp] via OPHTHALMIC
  Filled 2022-08-05: qty 15

## 2022-08-05 MED ORDER — TRAZODONE HCL 50 MG PO TABS: 50.0000 mg | ORAL_TABLET | Freq: Every evening | ORAL | Status: DC | PRN

## 2022-08-05 NOTE — Progress Notes (Signed)
Patient ID: Christopher Yu, male   DOB: 19-Nov-1960, 62 y.o.   MRN: [id number]  SW met with pt in room to provide updates from team conference, and d/c date 4/16. Pt is aware SW will follow-up with his wife to give updates.   [name] spoke with pt wife on above. She has questions in relation to his insomnia. Informed will relay to medical staff and ask someone to follow-up. SW discussed scheduling family education closer towards discharge, and SW will provide d/c recs as well. SW relayed medical question.  Loralee Pacas, MSW, [place] Office: [phone] Cell: [phone] Fax: [phone]

## 2022-08-05 NOTE — Progress Notes (Signed)
Physical Therapy Session Note  Patient Details  Name: Christopher Yu MRN: [id number] Date of Birth: [birth year]  Today's Date: 08/05/2022 PT Individual Time: 0807-0905 PT Individual Time Calculation (min): 58 min   Short Term Goals: Week 1:  PT Short Term Goal 1 (Week 1): Patient will perform bed/chair transfer with LRAD and CGA PT Short Term Goal 2 (Week 1): Patient will ambulate >10' with LRAD and CGA PT Short Term Goal 3 (Week 1): Patient will ascend/descend > a flight of stairs with R HR and CGA  Skilled Therapeutic Interventions/Progress Updates:  Patient greeted semi-reclined in bed in room and agreeable to PT treatment session- Denies pain this treatment session. Patient transitioned to sitting EOB with supv and use of bed rail- While sitting EOB, patient was able to don socks and shoes with set-up assistance and good sitting balance. Patient stood from EOB with RW and CGA/MinA for improved anterior weight shift/stability as patient demonstrates posterior bias. Patient gait trained x170' from his room to rehab gym with RW and CGA/MinA- VC for improved midline posturing, L lateral weight shift for improved R foot clearance and forward gaze.   Patient performed various sit/stands throughout treatment session without UE support and CGA/MinA- Patient with posterior bias as times and instructed to re-do stands with poor mechanics.  Neuro Re-Ed -Patient tasked with standing with S UE support on RW and alternating reaching across his body for squigz on mirror and then placing them in container on his L. Completed x14 with CGA/MinA for improved stability as patient presented with anterior lean during reaching with L UE.  -Patient stood on airex foam without UE support, 3 x 1 minute with CGA for safety and minor VC for improved posterior weight shift.  - Patient performed the same activity above with the squigz, however while standing on airex foam pad and with 1.5# weights donned to B  UE. CGA/MinA from therapist for improved stability.  -Patient performed alternating step-ups to airex foam pad with RW and CGA/MinA for improved stability- Mirror placed in front for external visual cues regarding midline posturing. Patient with anterior/posterior bias throughout activity and required VC for improved midline positioning. Patient performed 2 x 10 with a seated rest break in between.   Patient gait trained back to his room with RW and 4# weights donned to B ankles and CGA/MinA from therapist- Patient left sitting upright in wheelchair with posey belt on, call bell within reach and all needs met.    Therapy Documentation Precautions:  Precautions Precautions: Fall Restrictions Weight Bearing Restrictions: No Other Position/Activity Restrictions: Ataxia, impulsive, impaired problem-solving  Therapy/Group: Individual Therapy  Lareen Mullings 08/05/2022, 7:54 AM

## 2022-08-05 NOTE — Progress Notes (Signed)
Speech Language Pathology Daily Session Note  Patient Details  Name: Christopher Yu MRN: [id number] Date of Birth: [birth year]  Today's Date: 08/05/2022 SLP Individual Time: 0910-1000 SLP Individual Time Calculation (min): 50 min      Short Term Goals: Week 1: SLP Short Term Goal 1 (Week 1): Patient will consume curent diet with Min verbal cues for use of swallowing compensatory strategies. SLP Short Term Goal 2 (Week 1): Patient will utilize speech intelligibility strategies at the phrase level to achieve ~90% intelligibility with Min verbal cues. SLP Short Term Goal 3 (Week 1): Patient will demonstrate sustained attention to functional tasks for 30 minutes with  min verbal cues for redirection. SLP Short Term Goal 4 (Week 1): Patient will self-monitor and correct communication breakdowns with Mod verbal cues. SLP Short Term Goal 5 (Week 1): Patient will self-monitor and correct wet vocal quality with mod verbal cues.  Skilled Therapeutic Interventions: Skilled treatment session focused on cognitive and dysphagia goals. SLP facilitated session by providing skilled observation with patient consuming medications crushed in puree and nectar-thick liquids. Patient utilized swallowing compensatory strategies with Min verbal cues with a wet vocal quality observed with supervision level verbal cues needed to self-monitor and correct. Provided ongoing education regarding patient's current diet and how to appropriately thicken liquids. Patient verbalized understanding but will need reinforcement. SLP also facilitated session by providing Mod A verbal and visual cues for use of diaphragmatic breathing at the phrase level during a structured reading task. Task focused on utilization of diaphragmatic breathing, east onset, and taking appropriate breaths between words. Patient completed task with overall Mod A multimodal cues to self-monitor and correct communication breakdown. Patient left upright  in wheelchair with alarm on and all needs within reach. Continue with current plan of care.      Pain No/Denies Pain   Therapy/Group: Individual Therapy  Ellysa Parrack 08/05/2022, 11:04 AM

## 2022-08-05 NOTE — Progress Notes (Signed)
PROGRESS NOTE   Subjective/Complaints:  No acute complaints. No events overnight. Endorses sleeping well, eating well.  Per SLP, working on using diaphragm for breath support. Wife concerned regarding ongoing insomnia, which patient does not currently endorse. Also concerned regarding formulary sub for Systane eye drops; informed they may bring these in to dispense.  LBM 4/1  ROS: +HAs-none, +insomnia-resolved, +bruises R wrist  - stable. Denies fevers, chills, N/V, abdominal pain, constipation, diarrhea, SOB, cough, chest pain, new weakness or paraesthesias.    Objective:   No results found. Recent Labs    08/04/22 0447  WBC 10.6*  HGB 9.8*  HCT 31.5*  PLT 234    Recent Labs    08/04/22 0447  NA 141  K 4.1  CL 103  CO2 29  GLUCOSE 122*  BUN 18  CREATININE 0.79  CALCIUM 8.8*     Intake/Output Summary (Last 24 hours) at 08/05/2022 2040 Last data filed at 08/05/2022 0800 Gross per 24 hour  Intake 358 ml  Output 600 ml  Net -242 ml         Physical Exam: Vital Signs Blood pressure 104/61, pulse 80, temperature 98.5 F (36.9 C), resp. rate 15, height 6\' 1"  (1.854 m), weight 91.9 kg, SpO2 95 %.    General: NAD, sitting up in bed HEENT: Head with occipital swelling noted, fluctuant, nontender, no warmth - unchanged from prior exams, not decreased or increased in size. PERRLA Neck: Supple without JVD Heart: Reg rate and rhythm. No murmurs rubs or gallops Chest: CTA bilaterally without wheezes, rales, or rhonchi; no distress. Requires cueing for deep breathing with engaged core, diaphragm.  Abdomen: Soft, non-tender, non-distended, bowel sounds positive. Extremities: No clubbing, cyanosis, or edema.   Psych: Pt's affect is appropriate. Pt is cooperative.   Skin: Clean and intact without signs of breakdown, healed incision/scar to posterior neck MsK: small bruise to R wrist over ulnar styloid -  improving Neuro: Alert and oriented x 4,  + Speech apraxia and dysarthria; ongoing Strength 4+ out of 5 in proximal lower extremities Otherwise 5/5 throughout Sensation intact light touch in all 4 extremities + FTN slightly altered L>R HTS b/l slightly slow to complete Musculoskeletal: Normal bulk and tone, no joint swelling noted Healed right TKA incision   Assessment/Plan: 1. Functional deficits which require 3+ hours per day of interdisciplinary therapy in a comprehensive inpatient rehab setting. Physiatrist is providing close team supervision and 24 hour management of active medical problems listed below. Physiatrist and rehab team continue to assess barriers to discharge/monitor patient progress toward functional and medical goals  Care Tool:  Bathing    Body parts bathed by patient: Right arm, Left arm, Chest, Abdomen, Front perineal area, Right upper leg, Buttocks, Left upper leg, Right lower leg, Left lower leg, Face         Bathing assist Assist Level: Moderate Assistance - Patient 50 - 74%     Upper Body Dressing/Undressing Upper body dressing   What is the patient wearing?: Pull over shirt    Upper body assist Assist Level: Minimal Assistance - Patient > 75%    Lower Body Dressing/Undressing Lower body dressing  What is the patient wearing?: Pants, Underwear/pull up     Lower body assist Assist for lower body dressing: Moderate Assistance - Patient 50 - 74%     Toileting Toileting    Toileting assist Assist for toileting: Minimal Assistance - Patient > 75%     Transfers Chair/bed transfer  Transfers assist     Chair/bed transfer assist level: Minimal Assistance - Patient > 75%     Locomotion Ambulation   Ambulation assist      Assist level: Minimal Assistance - Patient > 75% Assistive device: Walker-rolling Max distance: 150   Walk 10 feet activity   Assist     Assist level: Minimal Assistance - Patient > 75% Assistive  device: Walker-rolling   Walk 50 feet activity   Assist    Assist level: Minimal Assistance - Patient > 75% Assistive device: Walker-rolling    Walk 150 feet activity   Assist    Assist level: Minimal Assistance - Patient > 75% Assistive device: Walker-rolling    Walk 10 feet on uneven surface  activity   Assist     Assist level: Minimal Assistance - Patient > 75% Assistive device: Walker-rolling   Wheelchair     Assist Is the patient using a wheelchair?: Yes Type of Wheelchair: Manual    Wheelchair assist level: Minimal Assistance - Patient > 75% Max wheelchair distance: 150'    Wheelchair 50 feet with 2 turns activity    Assist        Assist Level: Minimal Assistance - Patient > 75%   Wheelchair 150 feet activity     Assist      Assist Level: Minimal Assistance - Patient > 75%   Blood pressure 104/61, pulse 80, temperature 98.5 F (36.9 C), resp. rate 15, height 6\' 1"  (1.854 m), weight 91.9 kg, SpO2 95 %.   Medical Problem List and Plan: 1. Functional deficits secondary to postoperative changes from recent pseudomeningocele repair with worsening ataxia, dysarthria and dysphagia as well as history of AVM resection December 2023..             -patient may shower             -ELOS/Goals: 7-10 days, Mod I PT/OT/SLP; DC goal 4/16             -Admit to CIR -Decadron as indicated 4 MG Q8 HOURS with TAPER STARTING 3/29 to 4/10  2.  Antithrombotics: -DVT/anticoagulation:  Pharmaceutical: Lovenox 40mg  QD             -antiplatelet therapy: Aspirin 81 mg daily  3. Pain Management: Tylenol as needed, Diazepam 5mg  q8h PRN muscle spasms, Voltaren 1% 2g QID - 3/29: Schedule Tylenol 1000 mg TID, add gabapentin 300 mg QHS for headache and sleep -08/02/22 pt previously on Hydrocodone-APAP 5-325mg  at prior discharge, was still using this, would like to keep; reordered for now, 5-325mg  q6h PRN x7 days, if needed longer then weekday team to discuss;  adjusted tylenol to 650mg  TID to avoid overdosing of tylenol; would also prefer gabapentin be scheduled BID rather than PRN -08/03/22 - 4/1 improved control, cont regimen for now  4. Mood/Behavior/Sleep: Zoloft 50 mg daily             -antipsychotic agents: N/A   -3/29: Gabapentin as above, melatonin 5mg  QHS PRN  -08/02/22 scheduled Gabapentin BID and melatonin QHS per request  -08/03/22 greatly improved sleep - continue   - 4/2: Added trazodone 50 mg PRN for ?insomnia; not endorsed by patient.  5. Neuropsych/cognition: This patient is capable of making decisions on his own behalf. 6. Skin/Wound Care: Routine skin checks 7. Dysphagia/Fluids/Electrolytes/Nutrition: Routine in and outs with follow-up chemistries  - 3/29: Cr stable, BUN increased, encourage PO fluids and re-test Monday--ordered weekly; stable CR, BUN improved  4/1 -08/02/22 takes Vitamin D 2000IU QD and Calcium supplement at home; reordered  8.  Hyperlipidemia.  Crestor 20mg  daily 9.  Rheumatoid arthritis.  Consider resuming home regimen of rituximab, methotrexate and sulfasalazine after steroids completed (4/10).  Currently on Dexamethasone with taper. -08/02/22 wife concerned with restarting of meds; advised to readdress this once steroids have tapered around 08/13/22   10.  CAD.  Continue low-dose aspirin. Denies CP 11.  Dysphagia. Regular diet with nectar liquids. Follow up SLP 12.  Chronic systolic congestive heart failure.  Monitor for any signs of fluid overload. Daily weight. Currently HOLDING Spironolactone 12.5mg  QD (halved during last hospital stay), Lisinopril 10mg  QD, Metoprolol 25mg  QD  -3/30-4/1 wt stable, BP stable, continue to monitor  - 4/2: weights mildly increased, vitals stable; may resume BB if weights remain stable and no low BP in next 1-2 days Filed Weights   08/03/22 0554 08/04/22 0449 08/05/22 0500  Weight: 91 kg 91 kg 91.9 kg      08/05/2022    7:49 PM 08/05/2022   12:50 PM 08/05/2022    5:07 AM   Vitals with BMI  Systolic [id number]  Diastolic 61 93 76  Pulse 80 95 85      13.  OSA. Intolerance to CPAP. Counseling on CPAP benefits.  14. Prediabetes. A1C 6.0. Monitor on CBC/CMP 15. Leukocytosis. Likely due to steroids. Stable. Monitor for s/s infection.    - 4/12: 10.6; monitor  16. Macular degeneration: takes Preservision AREDS2 and Systane 0.6% eye drops at home -08/02/22 ordered hospital formulary alternatives- Lacrilube BID and Prosight multivitamin QD - 4/1-4/2: Wife concerned regarding use of alternative to systane; recommended bringing in from home   17. R wrist bruising - stable -08/02/22 wife concerned with bruises to R wrist, appear older/healing, no tenderness or crepitus/deformities, no swelling. Suspect minor pressure related bruise, doubt underlying bony injury, doubt need for xray; likely related to blood thinners; monitor for now  - 4/12: Mild HgB drop 10.6->9.8; repeat h/h if suspicious for enlarging hematoma or melena. Currently within normal limits for lab variability.     LOS: 5 days A FACE TO FACE EVALUATION WAS PERFORMED  Gertie Gowda 08/05/2022, 8:40 PM

## 2022-08-05 NOTE — Patient Care Conference (Signed)
Inpatient RehabilitationTeam Conference and Plan of Care Update Date: 08/05/2022   Time: 10:55 AM    Patient Name: Christopher Yu      Medical Record Number: [id number]  Date of Birth: 30-Dec-1960 Sex: Male         Room/Bed: [id number] Payor Info: Payor: [place] / Plan: [employer] COMM PPO / Product Type: *No Product type* /    Admit Date/Time:  07/31/2022  2:07 PM  Primary Diagnosis:  Pseudomeningocele  Hospital Problems: Principal Problem:   Pseudomeningocele    Expected Discharge Date: Expected Discharge Date: 08/19/22  Team Members Present: Physician leading conference: Dr. Durel Salts Social Worker Present: Loralee Pacas, [place] Nurse Present: Tacy Learn, RN PT Present: Terence Lux, PT OT Present: Cherylynn Ridges, OT SLP Present: Weston Anna, SLP PPS Coordinator present : Ileana Ladd, PT     Current Status/Progress Goal Weekly Team Focus  Bowel/Bladder   continent of b/b; LBM: 4/1   remain continent   assist with toileting needs prn    Swallow/Nutrition/ Hydration   Regular textures with nectar-thick liquids, Min-Mod A for use of swallowing compensatory strategies   Supervision  tolerance of current diet, use of strategies    ADL's   Min A overall for BADL tasks, min to mod A for dynamic balance   Supervision, mod I   Self-care retraining, pt/family education, dc planning balance, generalized strengthening    Mobility   Supv for bed mobility; Min/ModA for sit/stands, transfers, gait up to 170' with RW; MinA for stair mobility with B HR- Limited by poor balance and midline posturing   Supv with LRAD  Dynamic stability, midline posturing, endurance/activity tolerance, NMR, sit/stands, transfers, gait, stair mobility, discharge planning, family education, etc.    Communication   Mod-Max A   Min A   use of speech intelligibility strategies, breath support    Safety/Cognition/ Behavioral Observations  Mod A    Supervision   slective attention, emergent awareness    Pain   c/o generalized pain; prn norco   pain level <4/10   assess pain level QS and prn    Skin   healed incision to back of neck OTA   remain free of new skin breakdown/infection  assess skin QS and prn      Discharge Planning:  Pt will discharge to home with support from his wife, and PRN support from their children. SW will confirm there are no barriers to discharge.   Team Discussion: Pseudomeningocele. Continent B/B. Pain managed with PRN medications. Insomnia. Cervical incision intact without drainage. Wife ask to bring in patient eye drops if needed. Encourage fluids. Daily weights. Min A with BADLs. Min/Mod A for dynamic balance. Supervision for bed mobility. Min/Mod A for STS, transfers. MinA for stairs with bilateral HR. Patient limited by poor balance and midline posturing.  Patient on target to meet rehab goals: yes, gait 180' with RW  *See Care Plan and progress notes for long and short-term goals.   Revisions to Treatment Plan:  Medication adjustments  Teaching Needs: Medications, safety, self care, gait/transfer training, skin care, etc.   Current Barriers to Discharge: Decreased caregiver support and Home enviroment access/layout  Possible Resolutions to Barriers: Family education, nursing education, order recommended DME     Medical Summary Current Status: medically complicated by poor pain control, insomnia, dry eyes/macular degeneration, CHF, and constipation  Barriers to Discharge: Uncontrolled Pain;Self-care education;Cardiac Complications;Medical stability  Barriers to Discharge Comments: pain control, insomnia, heart emdication regimen titration Possible Resolutions  to Barriers/Weekly Focus: titrate medicaitons, monitor labs and vitals   Continued Need for Acute Rehabilitation Level of Care: The patient requires daily medical management by a physician with specialized training in physical  medicine and rehabilitation for the following reasons: Direction of a multidisciplinary physical rehabilitation program to maximize functional independence : Yes Medical management of patient stability for increased activity during participation in an intensive rehabilitation regime.: Yes Analysis of laboratory values and/or radiology reports with any subsequent need for medication adjustment and/or medical intervention. : Yes   I attest that I was present, lead the team conference, and concur with the assessment and plan of the team.   Ernest Pine 08/05/2022, 3:38 PM

## 2022-08-05 NOTE — Progress Notes (Signed)
Occupational Therapy Session Note  Patient Details  Name: Christopher Yu MRN: [id number] Date of Birth: 21-Aug-1960  Today's Date: 08/05/2022 Session 1 OT Individual Time: 1100-1200 OT Individual Time Calculation (min): 60 min   Session 2 OT Individual Time: 1300-1345 OT Individual Time Calculation (min): 45 min    Short Term Goals: Week 1:  OT Short Term Goal 1 (Week 1): Patient will maintain standing balance at the sink with CGA within functional BADL task OT Short Term Goal 2 (Week 1): Patient will complete LB dressing with min A OT Short Term Goal 3 (Week 1): Patient will tolerate standing for 3 minutes in preparation for BADL tasks  Skilled Therapeutic Interventions/Progress Updates:  Pt greeted sitting in wc and agreeable to OT treatment session. Pt brought down to therapy gym in wc for time management. Addressed standig balance/ednurance, visual scanning, L fine motor coordination with focus on accuracy, reaction time, and amount of hits.    Accuracy 45%, 60%, 67.5% Reaction time: 2.45, 2.13 Hit: 20, 28 Added in central fixation and was able to attend to 4/5 changes in letter.  Trail making activity alternating letters and numbers while using the L hand. Pt had difficulty with alternating letters and numbers. He did ok with only 5 letters/numbers, but then when presented with larger set of numbers/letters, he needed moderate cues to problem solve the correct pattern. L hand fine motor coordination with small foam actviity. Focus on in-hand manipulation and palmer translation of foam cubes. Pt returned to room and left seated In wc with alarm belt on, call bell in reach, and needs met.   Session 2 Pt greeted seated in wc and agreeable to OT treatment session. Declined need to go to the bathroom. Pt brought to therapy gym in wc. Stand-pivot to the R without an AD with min A to stand, but then had lateral and anterior LOB almost throwing himself onto therapy mat requiring  max A to correct and safely sit onto mat. Blocked practice for reaching outside base of support with L and R UE. Placed 1.5 lb weighs onto B UE's and worked on distal stability reaching outside base of support with UE's to reach and collect clothes pins from pole, then place overhead onto basketball goal. Pt needed min up to moderate assistance for balance with cues to keep feet under hips. Pt's losses of balance were unpredictable with pt losing balance anterior, posterior, and laterally. Pt returned to room and was left seated in wc with alarm belt on, call bell in reach, and needs met.    Therapy Documentation Precautions:  Precautions Precautions: Fall Restrictions Weight Bearing Restrictions: No Other Position/Activity Restrictions: Ataxia, impulsive, impaired problem-solving  Pain:  Denies pain    Therapy/Group: Individual Therapy  Valma Cava 08/05/2022, 1:57 PM

## 2022-08-06 ENCOUNTER — Inpatient Hospital Stay (HOSPITAL_COMMUNITY): Payer: BC Managed Care – PPO

## 2022-08-06 DIAGNOSIS — G96198 Other disorders of meninges, not elsewhere classified: Secondary | ICD-10-CM | POA: Diagnosis not present

## 2022-08-06 DIAGNOSIS — F4320 Adjustment disorder, unspecified: Secondary | ICD-10-CM

## 2022-08-06 LAB — CBC WITH DIFFERENTIAL/PLATELET
Abs Immature Granulocytes: 0.08 10*3/uL — ABNORMAL HIGH (ref 0.00–0.07)
Basophils Absolute: 0 10*3/uL (ref 0.0–0.1)
Basophils Relative: 0 %
Eosinophils Absolute: 0 10*3/uL (ref 0.0–0.5)
Eosinophils Relative: 0 %
HCT: 31.3 % — ABNORMAL LOW (ref 39.0–52.0)
Hemoglobin: 10.1 g/dL — ABNORMAL LOW (ref 13.0–17.0)
Immature Granulocytes: 1 %
Lymphocytes Relative: 11 %
Lymphs Abs: 1.3 10*3/uL (ref 0.7–4.0)
MCH: 23.5 pg — ABNORMAL LOW (ref 26.0–34.0)
MCHC: 32.3 g/dL (ref 30.0–36.0)
MCV: 73 fL — ABNORMAL LOW (ref 80.0–100.0)
Monocytes Absolute: 0.8 10*3/uL (ref 0.1–1.0)
Monocytes Relative: 7 %
Neutro Abs: 9.8 10*3/uL — ABNORMAL HIGH (ref 1.7–7.7)
Neutrophils Relative %: 81 %
Platelets: 237 10*3/uL (ref 150–400)
RBC: 4.29 MIL/uL (ref 4.22–5.81)
RDW: 19.8 % — ABNORMAL HIGH (ref 11.5–15.5)
WBC: 12 10*3/uL — ABNORMAL HIGH (ref 4.0–10.5)
nRBC: 0 % (ref 0.0–0.2)

## 2022-08-06 LAB — BASIC METABOLIC PANEL
Anion gap: 9 (ref 5–15)
BUN: 19 mg/dL (ref 8–23)
CO2: 29 mmol/L (ref 22–32)
Calcium: 9 mg/dL (ref 8.9–10.3)
Chloride: 102 mmol/L (ref 98–111)
Creatinine, Ser: 0.86 mg/dL (ref 0.61–1.24)
GFR, Estimated: >60 mL/min (ref >60)
Glucose, Bld: 103 mg/dL — ABNORMAL HIGH (ref 70–99)
Potassium: 4 mmol/L (ref 3.5–5.1)
Sodium: 140 mmol/L (ref 135–145)

## 2022-08-06 MED ORDER — TRAZODONE HCL 50 MG PO TABS
50.0000 mg | ORAL_TABLET | Freq: Every day | ORAL | Status: DC
  Administered 2022-08-06 – 2022-08-25 (×20): 50 mg via ORAL
  Filled 2022-08-06 (×20): qty 1

## 2022-08-06 NOTE — Progress Notes (Signed)
  NEUROSURGERY PROGRESS NOTE   I have reviewed the events of the last week with the patient, his wife, and in EMR.  EXAM:  BP 134/84 (BP Location: Left Arm)   Pulse (!) 101   Temp 98.2 F (36.8 C) (Oral)   Resp 17   Ht 6\' 1"  (1.854 m)   Wt 89.6 kg   SpO2 95%   BMI 26.06 kg/m   Awake, alert, oriented  Speech very dysarthric but appropriate  EOMI, Face symmetric MAE well  IMAGING: MRI does reveal a subacute right cerebellar stroke. There is also postoperative change in the midline and left cerebellar hemisphere from prior AVM resection. CTH today also reviewed and compared with prior CT. This demonstrates some increase in CSF collection in the operative cavity as well as recurrence of the subcutaneous pseudomeningocele.  IMPRESSION:  62 y.o. male presenting after recent pseudomeningocele repair with severe dysarthria and gait instability as well as dysphagia. Suspect this is related to new cerebellar stroke however his previous history of worsening pseudomeningocele requiring operative repair and now radiographic evidence of recurrence suggests the patient also has underlying hydrocephalus. I therefore think he needs more permanent CSF diversion.  PLAN: - At the patient's and his wife's request, I will arrange for one of my partners to review his case and provide a second opinion. - Assuming there is concurrence with the above, would plan on placement of a right VP shunt later this week.   I reviewed the situation extensively with the patient and his wife. We discussed my suspicion that the patient does in fact have "external" hydrocephalus. I reviewed the details of the shunt procedure as well as the expected postop course and recovery. We also discussed potential risks including hemorrhage, shunt malfunction/infection potentially requiring further surgery. All questions this evening were answered.    Consuella Lose, MD [facility]

## 2022-08-06 NOTE — Progress Notes (Signed)
Occupational Therapy Session Note  Patient Details  Name: Christopher Yu MRN: [id number] Date of Birth: 05/08/1960  Today's Date: 08/06/2022 OT Individual Time: [id number] OT Individual Time Calculation (min): 60 min    Short Term Goals: Week 1:  OT Short Term Goal 1 (Week 1): Patient will maintain standing balance at the sink with CGA within functional BADL task OT Short Term Goal 2 (Week 1): Patient will complete LB dressing with min A OT Short Term Goal 3 (Week 1): Patient will tolerate standing for 3 minutes in preparation for BADL tasks  Skilled Therapeutic Interventions/Progress Updates:     Pt received in w/c with no pain. Voice sounding worse, increased ataxia and mild R lean in functional mobility with RW towards end of session. MD present after shower with OT alerting to observations.   ADL: Pt completes ADL at overall min A level. Pt encouraged to gather needed items for shower with no cuing for what to gather, hwoever cuing needed for doffing pants in seated v standing however pt ignores cues. Pt bathes sit to stand with grab bar and CGA. Set up for UB dressing and MIN A for balance during sit to stand from toilet to advance LB clothing past hips.    Therapeutic activity Pt completes bean bag toss in standing position with RW AD for balance and min A overall. Activity performed to improve dynamic balance and functional reach in mod ranges outside BOS in prep for BADLs/IADLs. Reacher used for retreval of item off the floor.    Pt left at end of session in w/c with exit alarm on, call light in reach and all needs met   Therapy Documentation Precautions:  Precautions Precautions: Fall Restrictions Weight Bearing Restrictions: No Other Position/Activity Restrictions: Ataxia, impulsive, impaired problem-solving General:    Therapy/Group: Individual Therapy  Tonny Branch 08/06/2022, 6:38 AM

## 2022-08-06 NOTE — Progress Notes (Signed)
Initial Nutrition Assessment  DOCUMENTATION CODES:   Not applicable  INTERVENTION:  - Continue Ensure Enlive po BID, each supplement provides 350 kcal and 20 grams of protein.  NUTRITION DIAGNOSIS:   Unintentional weight loss related to acute illness as evidenced by percent weight loss.  GOAL:   Patient will meet greater than or equal to 90% of their needs  MONITOR:   PO intake  REASON FOR ASSESSMENT:   Malnutrition Screening Tool    ASSESSMENT:   62 y.o. male admits to CIR related to functional deficits secondary to postoperative changes from recent pseudomeningocele repair with worsening ataxia, dysarthria and dysphagia as well as history of AVM resection December 2023. PMH includes: arthritis, CAD, OSA with CPAP, CHF.  Meds reviewed: TUMS, decadron, Vit D2. Labs reviewed:  WDL.   RD assessing remotely. Per record, pt has been eating 50-100% of his meals since admission. Pt is meeting his needs at this time. Pt has experienced a 15% wt loss over the past 4 months, which is significant. However wt has been holding steady over the past month. Pt has Ensure BID already ordered. RD will continue to closely monitor PO intakes.   NUTRITION - FOCUSED PHYSICAL EXAM:  Assess at follow up.   Diet Order:   Diet Order             Diet regular Room service appropriate? No; Fluid consistency: Nectar Thick  Diet effective now                   EDUCATION NEEDS:   Not appropriate for education at this time  Skin:  Skin Assessment: Reviewed RN Assessment  Last BM:  4/1 - type 6  Height:   Ht Readings from Last 1 Encounters:  07/31/22 6\' 1"  (1.854 m)    Weight:   Wt Readings from Last 1 Encounters:  08/06/22 89.6 kg    Ideal Body Weight:     BMI:  Body mass index is 26.06 kg/m.  Estimated Nutritional Needs:   Kcal:  [id number] kcals  Protein:  115-135 gm  Fluid:  >/= 2.2 L  Thalia Bloodgood, RD, LDN, CNSC.

## 2022-08-06 NOTE — Progress Notes (Signed)
Speech Language Pathology Daily Session Note  Patient Details  Name: Christopher Yu MRN: [id number] Date of Birth: [birth year]  Today's Date: 08/06/2022 SLP Individual Time: [id number] SLP Individual Time Calculation (min): 45 min  Short Term Goals: Week 1: SLP Short Term Goal 1 (Week 1): Patient will consume curent diet with Min verbal cues for use of swallowing compensatory strategies. SLP Short Term Goal 2 (Week 1): Patient will utilize speech intelligibility strategies at the phrase level to achieve ~90% intelligibility with Min verbal cues. SLP Short Term Goal 3 (Week 1): Patient will demonstrate sustained attention to functional tasks for 30 minutes with  min verbal cues for redirection. SLP Short Term Goal 4 (Week 1): Patient will self-monitor and correct communication breakdowns with Mod verbal cues. SLP Short Term Goal 5 (Week 1): Patient will self-monitor and correct wet vocal quality with mod verbal cues.  Skilled Therapeutic Interventions: Skilled treatment session focused on dysphagia and communication goals. Upon arrival, patient was awake while upright in the wheelchair. SLP facilitated session by providing skilled observation with breakfast meal of regular textures with nectar-thick liquids. Patient required supervision-Min verbal cues for use of swallowing compensatory strategies, especially use of multiple swallows. Patient with overt coughing X 2 throughout meal, suspect due to large sips and talking directly after swallowing. Recommend patient continue current diet. Patient would benefit from ongoing  education regarding the reasoning and importance for utilization of strategies. Patient demonstrated a decrease in overall speech intelligibility compared to yesterday's session with Max verbal cues needed to self-monitor and correct communication breakdowns at the phrase level. Overall, patient was 60-70% intelligible. Patient left upright in wheelchair with alarm on and  all needs within reach. Continue with current plan of care.      Pain No/Denies Pain   Therapy/Group: Individual Therapy  Jeremiah Curci 08/06/2022, 8:20 AM

## 2022-08-06 NOTE — Progress Notes (Signed)
PROGRESS NOTE   Subjective/Complaints:  Seen working with PT this a.m., notes that he has some increased ataxia and speech dysarthria compared to prior exam.  Patient endorses that he feels "behind", states he is not referring to the scheduling of therapies but rather to his own mental processes today.  He denies any nausea, vomiting, visual changes, fevers, chills.  He does endorse some difficulty sleeping, but is uncertain what the etiology of this is; denies pain, racing thoughts keeping him up.   ROS: +HAs-none, +insomnia-improved but ongoing, +bruises R wrist  - stable. Denies fevers, chills, N/V, abdominal pain, constipation, diarrhea, SOB, cough, chest pain, new weakness or paraesthesias.    Objective:   No results found. Recent Labs    08/04/22 0447  WBC 10.6*  HGB 9.8*  HCT 31.5*  PLT 234    Recent Labs    08/04/22 0447  NA 141  K 4.1  CL 103  CO2 29  GLUCOSE 122*  BUN 18  CREATININE 0.79  CALCIUM 8.8*     Intake/Output Summary (Last 24 hours) at 08/06/2022 1056 Last data filed at 08/06/2022 0845 Gross per 24 hour  Intake 240 ml  Output 1350 ml  Net -1110 ml         Physical Exam: Vital Signs Blood pressure 117/76, pulse (!) 56, temperature 98.4 F (36.9 C), temperature source Oral, resp. rate 18, height 6\' 1"  (1.854 m), weight 89.6 kg, SpO2 94 %.    General: NAD, sitting up in bed HEENT: Head with occipital swelling noted, fluctuant, nontender, no warmth - unchanged from prior exams, not decreased or increased in size. PERRLA Neck: Supple without JVD Heart: Reg rate and rhythm. No murmurs rubs or gallops Chest: CTA bilaterally without wheezes, rales, or rhonchi; no distress. Abdomen: Soft, non-tender, non-distended, bowel sounds positive. Extremities: No clubbing, cyanosis, or edema.   Psych: Pt's affect is appropriate. Pt is cooperative.   Skin: Clean and intact without signs of breakdown,  healed incision/scar to posterior neck MsK: small bruise to R wrist over ulnar styloid - improving  Neuro: Alert and oriented x 4, however with apparent cognitive slowing in response  + Speech apraxia and dysarthria; worse this a.m., intelligibility greatly decreased Strength antigravity and against resistance in bilateral upper extremities and lower extremities, no apparent changes from last exam Sensation intact light touch in all 4 extremities + FTN extremely altered L>R -much increased from prior exams, worsening ataxia  Musculoskeletal: Normal bulk and tone, no joint swelling noted Healed right TKA incision   Assessment/Plan: 1. Functional deficits which require 3+ hours per day of interdisciplinary therapy in a comprehensive inpatient rehab setting. Physiatrist is providing close team supervision and 24 hour management of active medical problems listed below. Physiatrist and rehab team continue to assess barriers to discharge/monitor patient progress toward functional and medical goals  Care Tool:  Bathing    Body parts bathed by patient: Right arm, Left arm, Chest, Abdomen, Front perineal area, Right upper leg, Buttocks, Left upper leg, Right lower leg, Left lower leg, Face         Bathing assist Assist Level: Moderate Assistance - Patient 50 - 74%  Upper Body Dressing/Undressing Upper body dressing   What is the patient wearing?: Pull over shirt    Upper body assist Assist Level: Minimal Assistance - Patient > 75%    Lower Body Dressing/Undressing Lower body dressing      What is the patient wearing?: Pants, Underwear/pull up     Lower body assist Assist for lower body dressing: Moderate Assistance - Patient 50 - 74%     Toileting Toileting    Toileting assist Assist for toileting: Minimal Assistance - Patient > 75%     Transfers Chair/bed transfer  Transfers assist     Chair/bed transfer assist level: Minimal Assistance - Patient > 75%      Locomotion Ambulation   Ambulation assist      Assist level: Minimal Assistance - Patient > 75% Assistive device: Walker-rolling Max distance: 150   Walk 10 feet activity   Assist     Assist level: Minimal Assistance - Patient > 75% Assistive device: Walker-rolling   Walk 50 feet activity   Assist    Assist level: Minimal Assistance - Patient > 75% Assistive device: Walker-rolling    Walk 150 feet activity   Assist    Assist level: Minimal Assistance - Patient > 75% Assistive device: Walker-rolling    Walk 10 feet on uneven surface  activity   Assist     Assist level: Minimal Assistance - Patient > 75% Assistive device: Walker-rolling   Wheelchair     Assist Is the patient using a wheelchair?: Yes Type of Wheelchair: Manual    Wheelchair assist level: Minimal Assistance - Patient > 75% Max wheelchair distance: 150'    Wheelchair 50 feet with 2 turns activity    Assist        Assist Level: Minimal Assistance - Patient > 75%   Wheelchair 150 feet activity     Assist      Assist Level: Minimal Assistance - Patient > 75%   Blood pressure 117/76, pulse (!) 56, temperature 98.4 F (36.9 C), temperature source Oral, resp. rate 18, height 6\' 1"  (1.854 m), weight 89.6 kg, SpO2 94 %.   Medical Problem List and Plan: 1. Functional deficits secondary to postoperative changes from recent pseudomeningocele repair with worsening ataxia, dysarthria and dysphagia as well as history of AVM resection December 2023..             -patient may shower             -ELOS/Goals: 7-10 days, Mod I PT/OT/SLP; DC goal 4/16             -Admit to CIR -Decadron as indicated 4 MG Q8 HOURS with TAPER STARTING 3/29 to 4/10 -4-3: No focal neurologic deficits, however worsening dysarthria, cognition, and bilateral upper extremity ataxia compared to other exams.  Ordered CT head, labs today for workup.  2.  Antithrombotics: -DVT/anticoagulation:   Pharmaceutical: Lovenox 40mg  QD             -antiplatelet therapy: Aspirin 81 mg daily  3. Pain Management: Tylenol as needed, Diazepam 5mg  q8h PRN muscle spasms, Voltaren 1% 2g QID - 3/29: Schedule Tylenol 1000 mg TID, add gabapentin 300 mg QHS for headache and sleep -08/02/22 pt previously on Hydrocodone-APAP 5-325mg  at prior discharge, was still using this, would like to keep; reordered for now, 5-325mg  q6h PRN x7 days, if needed longer then weekday team to discuss; adjusted tylenol to 650mg  TID to avoid overdosing of tylenol; would also prefer gabapentin be scheduled BID rather than  PRN -08/03/22 - 4/1 improved control, cont regimen for now  4. Mood/Behavior/Sleep: Zoloft 50 mg daily             -antipsychotic agents: N/A   -3/29: Gabapentin as above, melatonin 5mg  QHS PRN  -08/02/22 scheduled Gabapentin BID and melatonin QHS per request  -08/03/22 greatly improved sleep - continue   - 4/2: Added trazodone 50 mg PRN for ?insomnia; not endorsed by patient.   -4-3: Patient endorses ongoing insomnia, uncertain etiology.  As needed trazodone not utilized, will schedule starting tonight.  5. Neuropsych/cognition: This patient is capable of making decisions on his own behalf. 6. Skin/Wound Care: Routine skin checks 7. Dysphagia/Fluids/Electrolytes/Nutrition: Routine in and outs with follow-up chemistries  - 3/29: Cr stable, BUN increased, encourage PO fluids and re-test Monday--ordered weekly; stable CR, BUN improved  4/1 -08/02/22 takes Vitamin D 2000IU QD and Calcium supplement at home; reordered  8.  Hyperlipidemia.  Crestor 20mg  daily 9.  Rheumatoid arthritis.  Consider resuming home regimen of rituximab, methotrexate and sulfasalazine after steroids completed (4/10).  Currently on Dexamethasone with taper. -08/02/22 wife concerned with restarting of meds; advised to readdress this once steroids have tapered around 08/13/22   10.  CAD.  Continue low-dose aspirin. Denies CP 11.  Dysphagia.  Regular diet with nectar liquids. Follow up SLP 12.  Chronic systolic congestive heart failure.  Monitor for any signs of fluid overload. Daily weight. Currently HOLDING Spironolactone 12.5mg  QD (halved during last hospital stay), Lisinopril 10mg  QD, Metoprolol 25mg  QD  -3/30-4/1 wt stable, BP stable, continue to monitor  - 4/3: weights downtrending, monitor Filed Weights   08/04/22 0449 08/05/22 0500 08/06/22 0439  Weight: 91 kg 91.9 kg 89.6 kg      08/06/2022    4:39 AM 08/06/2022    4:37 AM 08/05/2022    7:49 PM  Vitals with BMI  Weight 197 lbs 9 oz    BMI 123456    Systolic  [id number]  Diastolic  76 61  Pulse  56 80      13.  OSA. Intolerance to CPAP. Counseling on CPAP benefits.  14. Prediabetes. A1C 6.0. Monitor on CBC/CMP 15. Leukocytosis. Likely due to steroids. Stable. Monitor for s/s infection.    - 4/12: 10.6; monitor  4-3, recheck today due to cognitive changes above  16. Macular degeneration: takes Preservision AREDS2 and Systane 0.6% eye drops at home -08/02/22 ordered hospital formulary alternatives- Lacrilube BID and Prosight multivitamin QD - 4/1-4/2: Wife concerned regarding use of alternative to systane; recommended bringing in from home   17. R wrist bruising - stable -08/02/22 wife concerned with bruises to R wrist, appear older/healing, no tenderness or crepitus/deformities, no swelling. Suspect minor pressure related bruise, doubt underlying bony injury, doubt need for xray; likely related to blood thinners; monitor for now  - 4/12: Mild HgB drop 10.6->9.8; repeat h/h if suspicious for enlarging hematoma or melena. Currently within normal limits for lab variability.   18.  Cognitive slowing, increased dysarthria and ataxia.  See above, CT head and labs today for workup.  No focal weakness or sensory changes, no gross cranial nerve deficits to indicate need for acute stroke workup.  Last known normal would have been at bedtime last night.   LOS: 6 days A FACE TO  [place] 08/06/2022, 10:56 AM

## 2022-08-06 NOTE — Consult Note (Signed)
Neuropsychological Consultation Comprehensive Inpatient Rehab   Patient:   Christopher Yu   DOB:   Jul 27, 1960  MR Number:  [id number]  Location:  [facility] [place] [id number] [place] [address] Dept: [place]: [phone]           Date of Service:   08/05/2022  Start Time:   2 PM End Time:   3 PM  Provider/Observer:  Ilean Skill, Psy.D.       Clinical Neuropsychologist       Billing Code/Service: [phone]  Reason for Service:    Christopher Yu is a 62 year old male referred for neuropsychological consult due to coping and adjustment issues during his ongoing care on the comprehensive inpatient rehabilitation unit.  Patient has a past medical history including anxiety, rheumatoid arthritis, CAD, OSA with CPAP intolerance, chronic systolic congestive heart failure, cerebellar AVM resection in December 2023 and repair of pseudomeningocele on 07/18/2022 per Dr. Kathyrn Sheriff and discharged home 07/25/2022.  Patient presented to the ED on 07/28/2022 after developing progressive expressive language changes including slurred speech, gait abnormality and headache.  CT/MRI showed small focal acute infarction in the right superior cerebral hemisphere with surrounding area of increased T2 signal that was new since prior comparison in 07/04/2020.  Neurology was consulted as well as neurosurgery and felt that the small DWI intensity identified on imaging was not felt to be typical of stroke and more likely to be postoperative change.  Patient was started on steroids and after therapy evaluations were completed admitted to the comprehensive inpatient rehabilitation unit due to decreased functional ability, gait abnormality and expressive language deficits.  Patient was awake and alert but displayed significant deficits for expressive language that appeared to be primary motor in nature.   Patient has diaphragm motor impairments.  There were bilateral motor deficits noted and deficits inconsistent with primary cerebellar infarct.  Patient admitted to stress about his current postoperative changes.  Patient continues with psychotropic medications including Valium and sertraline as well as trazodone at night for sleep.  HPI for the current admission:    HPI: Christopher Yu is a 61 year old right-handed male with history significant for rheumatoid arthritis maintained on methotrexate, CAD maintained on low-dose aspirin, OSA with CPAP intolerance, quit smoking 14 years ago, chronic systolic congestive heart failure, 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 Kathyrn Sheriff and discharged home 07/25/2022.  Per chart review lives with spouse and family.  Spouse works 7-12 30 typically.  Two-level home bed and bath main level 3 steps to entry.  Patient was ambulated with a rolling walker and assistance from spouse since recent discharge 3/22.  Patient presented 07/28/2022 with progressive slurred speech gait abnormality and headache.  CT/MRI showed new small focus of acute infarction in the right superior cerebellar hemisphere with surrounding area of increased T2 signal and cystic change new since prior comparison 07/05/2022.  CT angiogram head and neck no vascular lesions seen underlying the cerebellar infarct.  Echocardiogram with ejection fraction of 40 to 45% left ventricle demonstrating global hypokinesis with grade 1 diastolic dysfunction.  Neurology Dr. Leonie Man as well as neurosurgery follow-up Dr. Duffy Rhody follow-up currently maintained on low-dose aspirin.  No current plan for neurosurgical intervention.  Lovenox added for DVT prophylaxis.  Neurology as well as neurosurgery felt that small DWI hyperintensity identified on imaging not felt to be typical of stroke and more likely to be postoperative change.  He was started on steroids with  dexamethasone taper.  His home rheumatologic medications currently on hold with plan to consider restarting after dexamethasone taper is complete.  He is currently on a regular diet with nectar thick liquid diet.  Therapy evaluations completed due to patient's decreased functional ability gait abnormality was admitted for a comprehensive rehab program.   Medical History:   Past Medical History:  Diagnosis Date   Anxiety    Arthritis, rheumatoid ([place])    dx 1988   CAD (coronary artery disease), native coronary artery    Mild LAD disease with calcification noted at cath 12//27/16    Cardiomyopathy ([place])    Cardiomyopathy ([place])    Chronic systolic heart failure ([place]) 05/01/2015   Depression    Deviated nasal septum 06/25/2011   Headache    High cholesterol    Hyperlipidemia    Impingement syndrome of left shoulder 12/22/2017   Macular degeneration    Rheumatoid aortitis    Sleep apnea    does not use cpap - UNABLE TO TOLERATE MASK   Sleep apnea in adult    deviated septum repaired, most recent sleep study was negative   Status post total right knee replacement 06/01/2015         Patient Active Problem List   Diagnosis Date Noted   Adjustment disorder 08/06/2022   Protein-calorie malnutrition, severe 07/31/2022   Acute CVA (cerebrovascular accident) 07/29/2022   Ischemic cerebrovascular accident (CVA) 07/28/2022   Prolonged QT interval 07/28/2022   Pseudomeningocele 07/18/2022   Pseudomeningocele due to surgical procedure 07/18/2022   Status post craniotomy 04/11/2022   AVM (arteriovenous malformation) brain 04/11/2022   Impingement syndrome of left shoulder 12/22/2017   Impingement syndrome of right shoulder 12/22/2017   Rheumatoid arthritis involving right knee 06/01/2015   Status post total right knee replacement 06/01/2015   Cardiomyopathy 05/02/2015   Hyperlipidemia    Sleep apnea in adult    Rheumatoid arthritis    Chronic systolic heart failure [id number]   CAD  (coronary artery disease), native coronary artery     Disposition/Plan:  Today we worked on coping and adjustment issues with continued motor deficits and expressive language changes.  Patient appears to be motivated to work on therapeutic interventions with expected discharge in 2 weeks.  I will likely plan on seeing the patient again as he continues to make progress with residual cognitive and motor deficits.         Electronically Signed   _______________________ Ilean Skill, Psy.D. Clinical Neuropsychologist

## 2022-08-06 NOTE — Progress Notes (Addendum)
Physical Therapy Session Note  Patient Details  Name: Christopher Yu MRN: [id number] Date of Birth: Apr 21, 1961  Today's Date: 08/06/2022 PT Individual Time: [id number] PT Individual Time Calculation (min): 16 min   Short Term Goals: Week 1:  PT Short Term Goal 1 (Week 1): Patient will perform bed/chair transfer with LRAD and CGA PT Short Term Goal 2 (Week 1): Patient will ambulate >46' with LRAD and CGA PT Short Term Goal 3 (Week 1): Patient will ascend/descend > a flight of stairs with R HR and CGA  Skilled Therapeutic Interventions/Progress Updates:    Pt presents in room, states he is very tired and not going to therapy this session. Pt does not report pain. This therapist attempts to educate pt on participation with therapy to achieve goals with pt continuing to refuse therapy. Pt reports being kept up all night due to neighbor "yelling," this therapist offers earplugs with pt reporting having trialed ear plugs and them not working as well as pt requesting room change however not being accommodated. This therapist attempts to build therapeutic alliance to promote improved participation with failed attempt. Pt agreeable to complete transfer to bed to nap in preparation for PM PT session. Pt completes stand pivot transfer, verbal cues for safety for keeping shoes donned secondary to pt attempting to take off prior to transfer, requires CGA for stand pivot no device wc to bed. Pt completes sit to supine transfer with supervision, reaches outside BOS for pillows and remains in sidelying with all needs within reach, call light in place, and bed alarm activated at end of session. This therapist communicates with members of interdisciplinary team to accommodate pt concerns at end of session.  Therapy Documentation Precautions:  Precautions Precautions: Fall Restrictions Weight Bearing Restrictions: No Other Position/Activity Restrictions: Ataxia, impulsive, impaired  problem-solving General: PT Amount of Missed Time (min): -44 Minutes PT Missed Treatment Reason: Patient fatigue;Patient unwilling to participate;Other (Comment) (pt reporting not getting any sleep last night secondary to pt in next room yelling, becomes verbally aggressive stating he's "not leaving this room!" because he is so tired)    Therapy/Group: Individual Therapy  Lorna Dibble 08/06/2022, 10:15 AM

## 2022-08-06 NOTE — Progress Notes (Signed)
Physical Therapy Session Note  Patient Details  Name: Christopher Yu MRN: [id number] Date of Birth: [birth year]  Today's Date: 08/06/2022 PT Individual Time: 1302-1350 PT Individual Time Calculation (min): 48 min   Short Term Goals: Week 1:  PT Short Term Goal 1 (Week 1): Patient will perform bed/chair transfer with LRAD and CGA PT Short Term Goal 2 (Week 1): Patient will ambulate >57' with LRAD and CGA PT Short Term Goal 3 (Week 1): Patient will ascend/descend > a flight of stairs with R HR and CGA  Skilled Therapeutic Interventions/Progress Updates:  Patient greeted supine in bed and agreeable to PT treatment session- Patient denied pain at this time, however did report that he was lethargic this morning secondary to a poor night's sleep. Patient with increased dysarthria and ataxic movement patterns this treatment session with MD and PA notified and aware.   Patient transitioned to sitting EOB with supv- While sitting EOB, patient donned tennis shoes with set-up assist. Patient stood from EOB with RW and CGA for safety with improved overall stability. Patient gait trained 180' from his room to day room with RW and MinA for improved stability- VC throughout for improved postural extension/forward gaze, improved left lateral lean in order to have improved R foot clearance and keeping the walker in contact with the floor.   Patient tasked with standing with S UE support while performing alternating foot taps to 4" step with Min/ModA for improved stability- Patient with improved stability when tapping with R foot compared to L.   Patient gait trained x180' with RW and 1.5# weight donned to B ankles and Min/ModA for improved stability- Same cues provided as above with a mod/max LOB to the R noted requiring steadying assistance from therapist secondary to absent righting reactions. Patient educated on improved righting strategies for future gait trials.   Patient gait trained x170'  back to his room with 2.5# weights donned to his B ankles and use of RW with Min/ModA from therapist for stability- Therapist facilitating R hip approximation/blocking in order to improve overall stance phase and prevent dropping into his hip leading to R lateral lean and poor overall stability/midline posturing.   Patient left semi-reclined in bed with spouse present, bed alarm on, call bell within reach and all needs met. Patient and spouse, Kathlee Nations, tasked with correcting meal orders for softer foods in order to decrease chance of coughing throughout meals.    Therapy Documentation Precautions:  Precautions Precautions: Fall Restrictions Weight Bearing Restrictions: No Other Position/Activity Restrictions: Ataxia, impulsive, impaired problem-solving  Therapy/Group: Individual Therapy  Codey Burling 08/06/2022, 11:15 AM

## 2022-08-07 DIAGNOSIS — I639 Cerebral infarction, unspecified: Secondary | ICD-10-CM

## 2022-08-07 LAB — GLUCOSE, CAPILLARY: Glucose-Capillary: 114 mg/dL — ABNORMAL HIGH (ref 70–99)

## 2022-08-07 MED ORDER — POLYETHYL GLYCOL-PROPYL GLYCOL 0.4-0.3 % OP SOLN
2.0000 [drp] | OPHTHALMIC | Status: DC | PRN
  Administered 2022-08-25: 2 [drp] via OPHTHALMIC

## 2022-08-07 NOTE — Discharge Summary (Signed)
Physician Discharge Summary  Patient ID: Christopher Yu MRN: [id number] DOB/AGE: 1960/06/22 62 y.o.  Admit date: 07/31/2022 Discharge date: 08/08/2022  Discharge Diagnoses:  Principal Problem:   Pseudomeningocele Active Problems:   Adjustment disorder DVT prophylaxis Dysphagia Hyperlipidemia Rheumatoid arthritis CAD Chronic systolic congestive heart failure OSA Prediabetes Macular degeneration  Discharged Condition: Guarded  Significant Diagnostic Studies: CT HEAD WO CONTRAST ( )  Result Date: 08/06/2022 CLINICAL DATA:  Mental status change, unknown cause. Pseudomeningocele. EXAM: CT HEAD WITHOUT CONTRAST TECHNIQUE: Contiguous axial images were obtained from the base of the skull through the vertex without intravenous contrast. RADIATION DOSE REDUCTION: This exam was performed according to the departmental dose-optimization program which includes automated exposure control, adjustment of the mA and/or kV according to patient size and/or use of iterative reconstruction technique. COMPARISON:  Head CT 07/29/2022. FINDINGS: Brain: Increased size of the cystic lesion along the roof of the fourth ventricle in the right cerebellar hemisphere, now measuring up to 3.6 x 2.6 cm (image 10 series 4), previously 2.1 x 1.4 cm. Increased mass effect on the inferior fourth ventricle. No acute hydrocephalus. No acute intracranial hemorrhage. Supratentorial cortical gray-white differentiation is preserved. Vascular: No hyperdense vessel or unexpected calcification. Skull: Unchanged postoperative appearance from prior suboccipital craniectomy. Sinuses/Orbits: Unremarkable. Other: Unchanged fluid collection in the dorsal soft tissues underlying the suboccipital craniectomy. IMPRESSION: 1. Increased size of the cystic lesion along the roof of the fourth ventricle in the right cerebellar hemisphere, now measuring up to 3.6 x 2.6 cm, previously 2.1 x 1.4 cm. Increased mass effect on the inferior  fourth ventricle. No acute hydrocephalus. 2. Unchanged fluid collection in the dorsal soft tissues underlying the suboccipital craniectomy. Electronically Signed   By: Orvan Falconer M.D.   On: 08/06/2022 12:16   DG Swallowing Func-Speech Pathology  Result Date: 07/30/2022 Table formatting from the original result was not included. Modified Barium Swallow Study Patient Details Name: Christopher Yu MRN: [id number] Date of Birth: [birth year] Today's Date: 07/30/2022 HPI/PMH: HPI: 62 y.o. male presents to [facility] hospital on 07/28/2022 with slurred speech, worsening gait and HA. Pt recently discharged 07/25/2022 after operative repair of pseudomeningiocele. MRI brain 3/25 demonstrates acute R cerebellar infarct. PMH: for CAD, R TKA, cardiomyopathy, chronic systolic heart failure, macular degeneration. Clinical Impression: Clinical Impression: Pt demonstrated minimal oral dysphagia with trace lingual residue present intermittently and mild pharyngeal dysphagia. Pt exhibited decreased laryngeal elevation with thin barium entering airway prior to full laryngeal closure resulting in penetration remaining on the anterior portion of vestibule. A chin tuck posture was not effective and he continued to penetrate with spontaeous cough that expelled penetrates. A head turn to left with thin resulted in sensed aspiration with weak cough. Nectar thick was also penetrated during and after the swallow and chin tuck was ineffective however a head turn to the left was 90% effective to prevent penetration over multiple trials. Reduced tonuge base retraction led to minimal vallecular residue consistently. Pt was able to masticate solid texture with flash penetration. Given deconditioning recommend he start a more conservative diet texture of Dys 2 (minced), nectar thick with head turn to the left with liquids, intermittent cough/throat clear, no straws, small sips and initiate meds crushed in puree (can advance to whole in puree  as able). Factors that may increase risk of adverse event in presence of aspiration Rubye Oaks & Clearance Coots 2021): No data recorded Recommendations/Plan: Swallowing Evaluation Recommendations Swallowing Evaluation Recommendations Recommendations: PO diet PO Diet Recommendation: Dysphagia 2 (Finely chopped); Mildly thick liquids (Level 2,  nectar thick) Liquid Administration via: Cup; No straw Medication Administration: Crushed with puree Supervision: Staff to assist with self-feeding; Full supervision/cueing for swallowing strategies Swallowing strategies  : Head turn left during swallowing (with liquids) Postural changes: Position pt fully upright for meals Oral care recommendations: Oral care BID (2x/day) Treatment Plan Treatment Plan Treatment recommendations: Therapy as outlined in treatment plan below Follow-up recommendations: Acute inpatient rehab (3 hours/day) Functional status assessment: Patient has had a recent decline in their functional status and demonstrates the ability to make significant improvements in function in a reasonable and predictable amount of time. Treatment frequency: Min 2x/week Treatment duration: 2 weeks Interventions: Aspiration precaution training; Patient/family education; Compensatory techniques; Trials of upgraded texture/liquids Recommendations Recommendations for follow up therapy are one component of a multi-disciplinary discharge planning process, led by the attending physician.  Recommendations may be updated based on patient status, additional functional criteria and insurance authorization. Assessment: Orofacial Exam: Orofacial Exam Oral Cavity: Oral Hygiene: WFL Oral Cavity - Dentition: Adequate natural dentition Orofacial Anatomy: Other (comment) Oral Motor/Sensory Function: Suspected cranial nerve impairment CN V - Trigeminal: WFL CN VII - Facial: Left motor impairment CN IX - Glossopharyngeal, CN X - Vagus: Not tested Anatomy: Anatomy: WFL Boluses Administered: Boluses  Administered Boluses Administered: Thin liquids (Level 0); Mildly thick liquids (Level 2, nectar thick); Moderately thick liquids (Level 3, honey thick); Puree; Solid  Oral Impairment Domain: Oral Impairment Domain Lip Closure: No labial escape Tongue control during bolus hold: Cohesive bolus between tongue to palatal seal Bolus preparation/mastication: Timely and efficient chewing and mashing Bolus transport/lingual motion: Brisk tongue motion Oral residue: Trace residue lining oral structures Location of oral residue : Tongue Initiation of pharyngeal swallow : Pyriform sinuses  Pharyngeal Impairment Domain: Pharyngeal Impairment Domain Soft palate elevation: No bolus between soft palate (SP)/pharyngeal wall (PW) Laryngeal elevation: Partial superior movement of thyroid cartilage/partial approximation of arytenoids to epiglottic petiole Anterior hyoid excursion: Partial anterior movement Epiglottic movement: Complete inversion Laryngeal vestibule closure: Incomplete, narrow column air/contrast in laryngeal vestibule Pharyngeal stripping wave : Present - complete Pharyngeal contraction (A/P view only): N/A Pharyngoesophageal segment opening: Complete distension and complete duration, no obstruction of flow Tongue base retraction: Trace column of contrast or air between tongue base and PPW Pharyngeal residue: Collection of residue within or on pharyngeal structures Location of pharyngeal residue: Valleculae  Esophageal Impairment Domain: Esophageal Impairment Domain Esophageal clearance upright position: Complete clearance, esophageal coating Pill: Esophageal Impairment Domain Esophageal clearance upright position: Complete clearance, esophageal coating Penetration/Aspiration Scale Score: Penetration/Aspiration Scale Score 2.  Material enters airway, remains ABOVE vocal cords then ejected out: Thin liquids (Level 0) 3.  Material enters airway, remains ABOVE vocal cords and not ejected out: Thin liquids (Level 0) 4.   Material enters airway, CONTACTS cords then ejected out: Mildly thick liquids (Level 2, nectar thick) Compensatory Strategies: Compensatory Strategies Compensatory strategies: Yes Chin tuck: Ineffective Ineffective Chin Tuck: Thin liquid (Level 0); Mildly thick liquid (Level 2, nectar thick) Left head turn: Effective (ineffective with thin, effective with nectar) Effective Left Head Turn: Mildly thick liquid (Level 2, nectar thick) Ineffective Left Head Turn: Thin liquid (Level 0)   General Information: Caregiver present: No  Diet Prior to this Study: NPO   Temperature : Normal   Respiratory Status: WFL   Supplemental O2: None (Room air)   History of Recent Intubation: No  Behavior/Cognition: Alert; Cooperative; Pleasant mood Self-Feeding Abilities: Able to self-feed Baseline vocal quality/speech: Hypophonia/low volume Volitional Cough: Able to elicit Volitional Swallow: Able to  elicit No data recorded Goal Planning: Prognosis for improved oropharyngeal function: Good No data recorded No data recorded No data recorded Consulted and agree with results and recommendations: Patient; Family member/caregiver Pain: Pain Assessment Pain Assessment: No/denies pain Pain Score: 5 Faces Pain Scale: 4 Pain Location: Headache Pain Descriptors / Indicators: Grimacing Pain Intervention(s): Monitored during session End of Session: Start Time:SLP Start Time (ACUTE ONLY): 1038 Stop Time: SLP Stop Time (ACUTE ONLY): 1051 Time Calculation:SLP Time Calculation (min) (ACUTE ONLY): 13 min Charges: SLP Evaluations $ SLP Speech Visit: 1 Visit SLP Evaluations $BSS Swallow: 1 Procedure $MBS Swallow: 1 Procedure SLP visit diagnosis: SLP Visit Diagnosis: Dysphagia, oropharyngeal phase (R13.12) Past Medical History: Past Medical History: Diagnosis Date  Anxiety   Arthritis, rheumatoid ([place])   dx 1988  CAD (coronary artery disease), native coronary artery   Mild LAD disease with calcification noted at cath 12//27/16   Cardiomyopathy ([place])    Cardiomyopathy ([place])   Chronic systolic heart failure ([place]) 05/01/2015  Depression   Deviated nasal septum 06/25/2011  Headache   High cholesterol   Hyperlipidemia   Impingement syndrome of left shoulder 12/22/2017  Macular degeneration   Rheumatoid aortitis   Sleep apnea   does not use cpap - UNABLE TO TOLERATE MASK  Sleep apnea in adult   deviated septum repaired, most recent sleep study was negative  Status post total right knee replacement 06/01/2015 Past Surgical History: Past Surgical History: Procedure Laterality Date  ANKLE FUSION Right 05/05/2012  related to his arthritis  ANKLE FUSION Left 05/25/2013  related to his arthritis  ANKLE SURGERY Bilateral   APPLICATION OF CRANIAL NAVIGATION N/A [id number]  Procedure: APPLICATION OF CRANIAL NAVIGATION;  Surgeon: Consuella Lose, MD;  Location: [place];  Service: Neurosurgery;  Laterality: N/A;  CARDIAC CATHETERIZATION N/A 05/01/2015  Procedure: Left Heart Cath and Coronary Angiography;  Surgeon: Belva Crome, MD;  Location: [place] [facility];  Service: Cardiovascular;  Laterality: N/A;  CRANIOTOMY N/A 04/11/2022  Procedure: STEREOTACTIC SUBOCCIPITAL CRANIOTOMY FOR RESECTION OF ARTERIO-VENOUS MALFORMATION;  Surgeon: Consuella Lose, MD;  Location: [place];  Service: Neurosurgery;  Laterality: N/A;  FRACTURE SURGERY    left femur fracture x 3  IR ANGIO EXTERNAL CAROTID SEL EXT CAROTID BILAT MOD SED  01/28/2022  IR ANGIO INTRA EXTRACRAN SEL INTERNAL CAROTID BILAT MOD SED  01/28/2022  IR ANGIO INTRA EXTRACRAN SEL INTERNAL CAROTID BILAT MOD SED  04/16/2022  IR ANGIO VERTEBRAL SEL VERTEBRAL BILAT MOD SED  01/28/2022  IR ANGIO VERTEBRAL SEL VERTEBRAL BILAT MOD SED  04/16/2022  KNEE ARTHROSCOPY    right x 4  LUMBAR LAMINECTOMY/DECOMPRESSION MICRODISCECTOMY N/A 07/18/2022  Procedure: Repair of Pseudomeningiocele posterior;  Surgeon: Consuella Lose, MD;  Location: [place];  Service: Neurosurgery;  Laterality: N/A;  NASAL SEPTOPLASTY W/ TURBINOPLASTY  06/25/2011   Procedure: NASAL SEPTOPLASTY WITH TURBINATE REDUCTION;  Surgeon: Jerrell Belfast, MD;  Location: [place];  Service: ENT;  Laterality: Bilateral;  PLACEMENT OF LUMBAR DRAIN N/A 07/18/2022  Procedure: PLACEMENT OF LUMBAR DRAIN;  Surgeon: Consuella Lose, MD;  Location: [place];  Service: Neurosurgery;  Laterality: N/A;  TONSILLECTOMY    TOTAL KNEE ARTHROPLASTY Right 06/01/2015  Procedure: RIGHT TOTAL KNEE ARTHROPLASTY;  Surgeon: Mcarthur Rossetti, MD;  Location: [facility];  Service: Orthopedics;  Laterality: Right;  Block+general [place] Siren 07/30/2022, 12:27 PM  ECHOCARDIOGRAM COMPLETE  Result Date: 07/29/2022    ECHOCARDIOGRAM REPORT   Patient Name:   MAXFIELD OLBERA Date of Exam: 07/29/2022 Medical Rec #:  [id number]  Height:       73.0 in Accession #:    [id number]                  Weight:       199.0 lb Date of Birth:  August 12, 1960                   BSA:          2.147 m Patient Age:    77 years                    BP:           124/103 mmHg Patient Gender: M                           HR:           93 bpm. Exam Location:  Inpatient Procedure: 2D Echo, Cardiac Doppler and Color Doppler Indications:    Stroke I63.9  History:        Patient has prior history of Echocardiogram examinations, most                 recent 06/22/2017. Cardiomyopathy and CHF, CAD, Stroke; Risk                 Factors:Sleep Apnea and Dyslipidemia.  Sonographer:    Ronny Flurry Referring Phys: [id number] TIMOTHY S OPYD  Sonographer Comments: No subcostal window. IMPRESSIONS  1. Left ventricular ejection fraction, by estimation, is 40 to 45%. The left ventricle has mildly decreased function. The left ventricle demonstrates global hypokinesis. Left ventricular diastolic parameters are consistent with Grade I diastolic dysfunction (impaired relaxation).  2. Right ventricular systolic function is normal. The right ventricular size is normal.  3. The mitral valve is normal in structure. No evidence of mitral  valve regurgitation.  4. Suspect bicuspid aortic valve. There is mild low flow-low gradient aortic stenosis (gradients are underestimated due to low stroke volume). The aortic valve is bicuspid. There is mild calcification of the aortic valve. There is moderate thickening of  the aortic valve. Aortic valve regurgitation is not visualized. Mild aortic valve stenosis. Aortic valve mean gradient measures 7.0 mmHg. Aortic valve Vmax measures 1.78 m/s.  5. Aortic dilatation noted. There is moderate dilatation of the aortic root, measuring 45 mm. Comparison(s): Prior images unable to be directly viewed, comparison made by report only. The aortic valve is identified as bicuspid, otherwise no change. FINDINGS  Left Ventricle: Left ventricular ejection fraction, by estimation, is 40 to 45%. The left ventricle has mildly decreased function. The left ventricle demonstrates global hypokinesis. The left ventricular internal cavity size was normal in size. There is  borderline concentric left ventricular hypertrophy. Left ventricular diastolic parameters are consistent with Grade I diastolic dysfunction (impaired relaxation). Normal left ventricular filling pressure. Right Ventricle: The right ventricular size is normal. No increase in right ventricular wall thickness. Right ventricular systolic function is normal. Left Atrium: Left atrial size was normal in size. Right Atrium: Right atrial size was normal in size. Pericardium: There is no evidence of pericardial effusion. Mitral Valve: The mitral valve is normal in structure. No evidence of mitral valve regurgitation. Tricuspid Valve: The tricuspid valve is normal in structure. Tricuspid valve regurgitation is not demonstrated. Aortic Valve: Suspect bicuspid aortic valve. There is mild low flow-low gradient aortic stenosis (gradients are underestimated due to low stroke volume). The aortic valve is bicuspid. There is mild  calcification of the aortic valve. There is moderate  thickening of the aortic valve. Aortic valve regurgitation is not visualized. Mild aortic stenosis is present. Aortic valve mean gradient measures 7.0 mmHg. Aortic valve peak gradient measures 12.7 mmHg. Aortic valve area, by VTI measures 1.97 cm. Pulmonic Valve: The pulmonic valve was grossly normal. Pulmonic valve regurgitation is not visualized. Aorta: Aortic dilatation noted. There is moderate dilatation of the aortic root, measuring 45 mm. IAS/Shunts: No atrial level shunt detected by color flow Doppler.  LEFT VENTRICLE PLAX 2D LVIDd:         4.60 cm LVIDs:         3.20 cm LV PW:         1.10 cm LV IVS:        1.20 cm LVOT diam:     2.60 cm LV SV:         60 LV SV Index:   28 LVOT Area:     5.31 cm  RIGHT VENTRICLE RV S prime:     12.90 cm/s LEFT ATRIUM             Index        RIGHT ATRIUM           Index LA diam:        3.60 cm 1.68 cm/m   RA Area:     15.70 cm LA Vol (A2C):   39.7 ml 18.49 ml/m  RA Volume:   31.45 ml  14.65 ml/m LA Vol (A4C):   31.0 ml 14.44 ml/m LA Biplane Vol: 36.5 ml 17.00 ml/m  AORTIC VALVE AV Area (Vmax):    2.01 cm AV Area (Vmean):   1.93 cm AV Area (VTI):     1.97 cm AV Vmax:           178.50 cm/s AV Vmean:          121.500 cm/s AV VTI:            0.302 m AV Peak Grad:      12.7 mmHg AV Mean Grad:      7.0 mmHg LVOT Vmax:         67.42 cm/s LVOT Vmean:        44.100 cm/s LVOT VTI:          0.112 m LVOT/AV VTI ratio: 0.37  AORTA Ao Root diam: 4.55 cm Ao Asc diam:  4.20 cm MITRAL VALVE MV Area (PHT): 3.77 cm    SHUNTS MV Decel Time: 201 msec    Systemic VTI:  0.11 m MV E velocity: 38.00 cm/s  Systemic Diam: 2.60 cm MV A velocity: 75.10 cm/s MV E/A ratio:  0.51 Mihai Croitoru MD Electronically signed by Sanda Klein MD Signature Date/Time: 07/29/2022/4:13:22 PM    Final    CT ANGIO HEAD NECK W WO CM  Result Date: 07/29/2022 CLINICAL DATA:  Stroke, determine embolic source EXAM: CT ANGIOGRAPHY HEAD AND NECK TECHNIQUE: Multidetector CT imaging of the head and neck was  performed using the standard protocol during bolus administration of intravenous contrast. Multiplanar CT image reconstructions and MIPs were obtained to evaluate the vascular anatomy. Carotid stenosis measurements (when applicable) are obtained utilizing NASCET criteria, using the distal internal carotid diameter as the denominator. RADIATION DOSE REDUCTION: This exam was performed according to the departmental dose-optimization program which includes automated exposure control, adjustment of the mA and/or kV according to patient size and/or use of iterative reconstruction technique. CONTRAST:  32mL OMNIPAQUE IOHEXOL 350 MG/ML SOLN  COMPARISON:  Brain MRI from yesterday FINDINGS: CT HEAD FINDINGS Brain: Pseudomeningocele with recent repair and posterior fossa gas along the upper cerebellum. Infarct by MRIs difficult to separate from chronic low-density in the right more than left cerebral hemisphere. Unchanged collection with mass effect asymmetrically to the left cerebral hemisphere. No hydrocephalus or acute hemorrhage. Vascular: See below Skull: Remote suboccipital craniectomy. Sinuses/Orbits: Negative Review of the MIP images confirms the above findings CTA NECK FINDINGS Aortic arch: Atheromatous plaque with 3 vessel branching. Right carotid system: Mild atheromatous plaque at the bifurcation. No ulceration or significant stenosis Left carotid system: Mild atheromatous plaque at the bifurcation. No ulceration or significant stenosis. Vertebral arteries: No proximal subclavian stenosis. Mild atheromatous plaque at the right vertebral origin. No beading or ulceration. Extrinsic narrowing of the left vertebral artery at C5-6 due to facet spurring from behind, narrowing measuring 60% on sagittal reformats. Skeleton: Ordinary cervical spine degeneration. Other neck: No acute finding. Known dissecting fluid collection spanning from the suboccipital craniectomy inferiorly into the neck. Upper chest: Clear apical lungs.  Review of the MIP images confirms the above findings CTA HEAD FINDINGS Anterior circulation: Mild calcified plaque along the carotid siphons. No stenosis, branch occlusion, beading, or aneurysm Posterior circulation: The vertebral and basilar arteries are smoothly contoured and widely patent. Symmetrically enhancing superior cerebellar arteries without visible occlusion or irregularity. Symmetric and smoothly contoured posterior cerebral arteries. Venous sinuses: Unremarkable for arterial timing. Anatomic variants: None significant Review of the MIP images confirms the above findings IMPRESSION: 1. No vascular lesion seen underlying the cerebellar infarct. 2. Chronic 60% narrowing of the left vertebral artery due to extrinsic compression by C5-6 left facet osteophyte. 3. Limited atheromatous change for age. Electronically Signed   By: Jorje Guild M.D.   On: 07/29/2022 05:06   MR Brain Wo Contrast (neuro protocol)  Result Date: 07/28/2022 CLINICAL DATA:  Neuro deficit, acute, stroke suspected. EXAM: MRI HEAD WITHOUT CONTRAST TECHNIQUE: Multiplanar, multiecho pulse sequences of the brain and surrounding structures were obtained without intravenous contrast. COMPARISON:  Head CT 07/28/2022.  MRI brain 07/05/2022. FINDINGS: Brain: New small focus of acute infarction in the right superior cerebellar hemisphere with surrounding area of increased T2 signal and cystic change, which is new since the prior MRI dated 07/05/2022 and suspicious for now chronic infarct. Postoperative changes of prior left posterior fossa resection. Redemonstrated foci of gas within the resection cavity, fourth ventricle and extra-axial spaces, likely secondary to recent pseudomeningocele repair and/or lumbar drain placement/removal. No acute intracranial hemorrhage.  No hydrocephalus. Vascular: Normal flow voids. Skull and upper cervical spine: Suboccipital craniectomy. Sinuses/Orbits: Trace fluid in the right sphenoid sinus. Other:  Decreased size of the fluid collection in the dorsal soft tissues overlying the suboccipital craniectomy. IMPRESSION: 1. New small focus of acute infarction in the right superior cerebellar hemisphere with surrounding area of increased T2 signal and cystic change, which is new since the prior MRI dated 07/05/2022 and suspicious for now chronic infarct. 2. Postoperative changes of prior left posterior fossa resection. Redemonstrated foci of gas within the resection cavity, fourth ventricle and extra-axial spaces, likely secondary to recent pseudomeningocele repair and/or lumbar drain placement/removal. 3. Decreased size of the fluid collection in the dorsal soft tissues overlying the suboccipital craniectomy. Electronically Signed   By: Emmit Alexanders M.D.   On: 07/28/2022 20:06   CT Head Wo Contrast  Result Date: 07/28/2022 CLINICAL DATA:  Headache, neuro deficit. EXAM: CT HEAD WITHOUT CONTRAST TECHNIQUE: Contiguous axial images were obtained from the base  of the skull through the vertex without intravenous contrast. RADIATION DOSE REDUCTION: This exam was performed according to the departmental dose-optimization program which includes automated exposure control, adjustment of the mA and/or kV according to patient size and/or use of iterative reconstruction technique. COMPARISON:  Head CT 04/29/2022.  MRI brain 07/05/2022. 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 New pneumocephalus in the resection cavity, likely secondary to recent pseudomeningocele repair and lumbar drain placement/removal. No acute intracranial hemorrhage, mass effect or midline shift. No hydrocephalus. Vascular: No hyperdense vessel or unexpected calcification. Skull: Suboccipital craniectomy. Sinuses/Orbits: Trace fluid in the sphenoid sinus. Orbits are unremarkable. Other: Partially imaged fluid collection in the dorsal soft  tissues overlying the suboccipital craniectomy, likely residual or recurrent pseudomeningocele. IMPRESSION: 1. 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 New pneumocephalus in the resection cavity, likely secondary to recent pseudomeningocele repair and lumbar drain placement/removal. No acute intracranial hemorrhage. 2. Partially imaged fluid collection in the dorsal soft tissues overlying the suboccipital craniectomy, likely residual or recurrent pseudomeningocele. Electronically Signed   By: Emmit Alexanders M.D.   On: 07/28/2022 17:15   DG Lumbar Spine 1 View  Result Date: 07/18/2022 CLINICAL DATA:  [id number] Surgery, elective [id number] EXAM: LUMBAR SPINE - 1 VIEW COMPARISON:  None Available. FINDINGS: Single intraoperative image during lumbar drain placement. IMPRESSION: Single intraoperative image during lumbar drain placement. Electronically Signed   By: Maurine Simmering M.D.   On: 07/18/2022 12:42   DG C-Arm 1-60 Min-No Report  Result Date: 07/18/2022 Fluoroscopy was utilized by the requesting physician.  No radiographic interpretation.    Labs:  Basic Metabolic Panel: Recent Labs  Lab 07/31/22 0640 08/01/22 0917 08/04/22 0447 08/06/22 1116  NA 140 140 141 140  K 3.5 3.5 4.1 4.0  CL 102 102 103 102  CO2 28 26 29 29   GLUCOSE 125* 149* 122* 103*  BUN 16 29* 18 19  CREATININE 0.82 0.93 0.79 0.86  CALCIUM 9.1 9.1 8.8* 9.0  MG 2.0  --   --   --     CBC: Recent Labs  Lab 08/01/22 0917 08/04/22 0447 08/06/22 1116  WBC 11.0* 10.6* 12.0*  NEUTROABS 10.0*  --  9.8*  HGB 10.6* 9.8* 10.1*  HCT 32.7* 31.5* 31.3*  MCV 72.8* 75.4* 73.0*  PLT 241 234 237    CBG: No results for input(s): "GLUCAP" in the last 168 hours.  Family history.  Positive for lupus.  Denies any colon cancer esophageal cancer or rectal cancer  Brief HPI:   Deonte Noe is a 62 y.o. 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 Kathyrn Sheriff and discharged home 07/25/2022.  Per chart review lives with spouse and family.  Spouse works extended hours.  Patient was ambulating with a rolling walker and assistance from spouse since recent discharge 3/22.  Presented 07/28/2022 with progressive slurred speech and gait abnormality and headache.  CT/MRI showed new small focus of acute infarction in the right superior cerebellar hemisphere with surrounding area of increased T2 signal and cystic change new since prior comparison 07/05/2022.  CT angiogram head and neck no vascular lesions seen underlying cerebellar infarct.  Echocardiogram with ejection fraction of 40 to 45% left ventricle demonstrating global  hypokinesis with grade 1 diastolic dysfunction.  Neurology follow-up as well as neurosurgery maintain on low-dose aspirin.  Lovenox added for DVT prophylaxis.  Neurology as well as neurosurgery felt that small DWI hyperintensity identified on imaging not felt to be typical of stroke and more likely to be postoperative change.  He was started on steroids and dexamethasone taper.  His home rheumatologic medications currently on hold with plan to consider restarting after dexamethasone taper completed.  Maintain on a regular diet with nectar thick liquids.  Therapy evaluations completed due to patient decreased functional mobility was admitted for a comprehensive rehab program.   Hospital Course: Michaeldavid Sylte was admitted to rehab 07/31/2022 for inpatient therapies to consist of PT, ST and OT at least three hours five days a week. Past admission physiatrist, therapy team and rehab RN have worked together to provide customized collaborative inpatient rehab.  Pertaining to  patient's postoperative changes from recent pseudomeningocele repair with worsening ataxia dysarthria and dysphagia as well as history of AVM resection December 2023.  Decadron is indicated.  Noted patient with increasing dysarthria cognitive slowing bilateral upper extremity ataxia compared to other exams with CT imaging repeated 08/06/2022 showed increased size of cystic lesion along the roof of the fourth ventricle in the right cerebellar hemisphere now measuring up to 3.6 x 2.6 cm previously 2.1 x 1.4 cm.  Increased mass effect on the inferior fourth ventricle.  Concern of hydrocephalus.  Neurosurgery consulted for plan of care and patient to be discharged to acute care services for right VP shunt per neurosurgery 08/08/2022.  Decadron therapy had been initiated.  During patient's rehab stay maintain on Lovenox for DVT prophylaxis low-dose aspirin as advised.  Pain management with diazepam for muscle spasms gabapentin added for headache as well as to aid in sleep.  Hydrocodone as needed for breakthrough pain.  Mood stabilization melatonin initially added for sleep with little results transition to trazodone.  Crestor ongoing for hyperlipidemia.  Patient with history of rheumatoid arthritis dexamethasone taper undergoing patient on rituximab and methotrexate/sulfasalazine prior to admission and currently on hold.  History of CAD no chest pain shortness of breath low-dose aspirin as advised.  Currently on a regular diet nectar thick liquids with follow-up per speech therapy for dysphagia.  Chronic systolic congestive heart failure exhibiting no signs of fluid overload blood pressure remains somewhat soft.  He did have a history of OSA intolerant to CPAP.  Prediabetes hemoglobin A1c 6.0 monitored while on steroid.   Blood pressures were monitored on TID basis and soft and monitored    Rehab course: During patient's stay in rehab weekly team conferences were held to monitor patient's progress, set goals and  discuss barriers to discharge. At admission, patient required min mod assist mobility rolling walker.  Min assist sit to stand  He/She  has had improvement in activity tolerance, balance, postural control as well as ability to compensate for deficits. He/She has had improvement in functional use RUE/LUE  and RLE/LLE as well as improvement in awareness.  Transition to sitting edge of bed supervision.  Ambulates 180 feet from his room to the day room rolling walker minimal assist for stability.  Completes ADL at overall minimal assist level.       Disposition: Discharge to acute care services 08/08/2022    Diet: Regular with nectar liquids  Special Instructions: As directed per neurosurgery     Follow-up Information     Angelina Sheriff, DO Follow up.   Specialty: Physical Medicine and  Rehabilitation Why: office to call for appointment Contact information: [address] [place] [address] [phone]         Lisbeth Renshaw, MD Follow up.   Specialty: Neurosurgery Why: call for appointment Contact information: [address] [place] [address] [phone]                 Signed: Charlton Amor 08/07/2022, 5:23 AM

## 2022-08-07 NOTE — Progress Notes (Addendum)
PROGRESS NOTE   Subjective/Complaints:  No events overnight.  See neurosurgery documentation for updates on surgical recommendations.  Wife at bedside, discussed in detail the sequence of events with multiple neurosurgical interventions, followed by poor outcomes and needs for repeated intervention.  She feels that they have encountered with seem to be rare complications after the surgery, and is hesitant to undergo another procedure.  They have reached out to their family doctor about a second opinion from [facility] neurosurgery, and are awaiting a call in regards to this today.  Has been defers final medical decision making to his wife, although he does have capacity to make his own decisions and input as well.  Reviewed the patient's course of hospitalization care with the family, agreed to wait on second opinion but, given his worsening ataxia, recommended moving forward with the VP shunt.  ROS: +HAs-none, +insomnia-improved but ongoing, +bruises R wrist  - stable. Denies fevers, chills, N/V, abdominal pain, constipation, diarrhea, SOB, cough, chest pain, new weakness or paraesthesias.    Objective:   CT HEAD WO CONTRAST (5MM)  Result Date: 08/06/2022 CLINICAL DATA:  Mental status change, unknown cause. Pseudomeningocele. EXAM: CT HEAD WITHOUT CONTRAST TECHNIQUE: Contiguous axial images were obtained from the base of the skull through the vertex without intravenous contrast. RADIATION DOSE REDUCTION: This exam was performed according to the departmental dose-optimization program which includes automated exposure control, adjustment of the mA and/or kV according to patient size and/or use of iterative reconstruction technique. COMPARISON:  Head CT 07/29/2022. FINDINGS: Brain: Increased size of the cystic lesion along the roof of the fourth ventricle in the right cerebellar hemisphere, now measuring up to 3.6 x 2.6 cm (image 10 series 4),  previously 2.1 x 1.4 cm. Increased mass effect on the inferior fourth ventricle. No acute hydrocephalus. No acute intracranial hemorrhage. Supratentorial cortical gray-white differentiation is preserved. Vascular: No hyperdense vessel or unexpected calcification. Skull: Unchanged postoperative appearance from prior suboccipital craniectomy. Sinuses/Orbits: Unremarkable. Other: Unchanged fluid collection in the dorsal soft tissues underlying the suboccipital craniectomy. IMPRESSION: 1. Increased size of the cystic lesion along the roof of the fourth ventricle in the right cerebellar hemisphere, now measuring up to 3.6 x 2.6 cm, previously 2.1 x 1.4 cm. Increased mass effect on the inferior fourth ventricle. No acute hydrocephalus. 2. Unchanged fluid collection in the dorsal soft tissues underlying the suboccipital craniectomy. Electronically Signed   By: Emmit Alexanders M.D.   On: 08/06/2022 12:16   Recent Labs    08/06/22 1116  WBC 12.0*  HGB 10.1*  HCT 31.3*  PLT 237    Recent Labs    08/06/22 1116  NA 140  K 4.0  CL 102  CO2 29  GLUCOSE 103*  BUN 19  CREATININE 0.86  CALCIUM 9.0     Intake/Output Summary (Last 24 hours) at 08/07/2022 1232 Last data filed at 08/07/2022 0830 Gross per 24 hour  Intake 236 ml  Output 1950 ml  Net -1714 ml         Physical Exam: Vital Signs Blood pressure 97/66, pulse 69, temperature 98.2 F (36.8 C), temperature source Oral, resp. rate 18, height 6\' 1"  (1.854 m), weight 88.6 kg,  SpO2 93 %.    General: NAD, sitting up in bed HEENT: Head with occipital swelling noted, fluctuant, nontender, no warmth - unchanged from prior exams. PERRLA Neck: Supple without JVD Heart: Reg rate and rhythm. No murmurs rubs or gallops Chest: CTA bilaterally without wheezes, rales, or rhonchi; no distress.  Abdomen: Soft, non-tender, non-distended, bowel sounds positive. Extremities: No clubbing, cyanosis, or edema.   Psych: Pt's affect is appropriate. Pt is  cooperative.   Skin: Clean and intact without signs of breakdown, healed incision/scar to posterior neck MsK: small bruise to R wrist over ulnar styloid - improving  Neuro: Alert and oriented x 4, apparent cognitive slowing in response - ongoing  + Speech apraxia and dysarthria; worse from 2 days prior but stable from yesterday Strength antigravity and against resistance in bilateral upper extremities and lower extremities, no apparent changes from last exam Sensation intact light touch in all 4 extremities + FTN extremely altered R>L today - Mild improvement on L  Musculoskeletal: Normal bulk and tone, no joint swelling noted Healed right TKA incision   Assessment/Plan: 1. Functional deficits which require 3+ hours per day of interdisciplinary therapy in a comprehensive inpatient rehab setting. Physiatrist is providing close team supervision and 24 hour management of active medical problems listed below. Physiatrist and rehab team continue to assess barriers to discharge/monitor patient progress toward functional and medical goals  Care Tool:  Bathing    Body parts bathed by patient: Right arm, Left arm, Chest, Abdomen, Front perineal area, Right upper leg, Buttocks, Left upper leg, Right lower leg, Left lower leg, Face         Bathing assist Assist Level: Moderate Assistance - Patient 50 - 74%     Upper Body Dressing/Undressing Upper body dressing   What is the patient wearing?: Pull over shirt    Upper body assist Assist Level: Minimal Assistance - Patient > 75%    Lower Body Dressing/Undressing Lower body dressing      What is the patient wearing?: Pants, Underwear/pull up     Lower body assist Assist for lower body dressing: Moderate Assistance - Patient 50 - 74%     Toileting Toileting    Toileting assist Assist for toileting: Minimal Assistance - Patient > 75%     Transfers Chair/bed transfer  Transfers assist     Chair/bed transfer assist level:  Minimal Assistance - Patient > 75%     Locomotion Ambulation   Ambulation assist      Assist level: Minimal Assistance - Patient > 75% Assistive device: Walker-rolling Max distance: 150   Walk 10 feet activity   Assist     Assist level: Minimal Assistance - Patient > 75% Assistive device: Walker-rolling   Walk 50 feet activity   Assist    Assist level: Minimal Assistance - Patient > 75% Assistive device: Walker-rolling    Walk 150 feet activity   Assist    Assist level: Minimal Assistance - Patient > 75% Assistive device: Walker-rolling    Walk 10 feet on uneven surface  activity   Assist     Assist level: Minimal Assistance - Patient > 75% Assistive device: Walker-rolling   Wheelchair     Assist Is the patient using a wheelchair?: Yes Type of Wheelchair: Manual    Wheelchair assist level: Minimal Assistance - Patient > 75% Max wheelchair distance: 150'    Wheelchair 50 feet with 2 turns activity    Assist        Assist Level: Minimal  Assistance - Patient > 75%   Wheelchair 150 feet activity     Assist      Assist Level: Minimal Assistance - Patient > 75%   Blood pressure 97/66, pulse 69, temperature 98.2 F (36.8 C), temperature source Oral, resp. rate 18, height 6\' 1"  (1.854 m), weight 88.6 kg, SpO2 93 %.   Medical Problem List and Plan: 1. Functional deficits secondary to postoperative changes from recent pseudomeningocele repair with worsening ataxia, dysarthria and dysphagia as well as history of AVM resection December 2023..             -patient may shower             -ELOS/Goals: 7-10 days, Mod I PT/OT/SLP; DC goal 4/16             -Admit to CIR -Decadron as indicated 4 MG Q8 HOURS with TAPER STARTING 3/29 to 4/10 -4-3: worsening dysarthria, cognition, and bilateral upper extremity ataxia compared to other exams. CT head concerning for obstructive hydrocephalus. 4/4: Dr. Alla German and Dr. Reatha Armour recommending VP  shunt, family waiting on second opinion from [place] for final determination. May go to OR in AM.  - Note Dr. Alla German stating patient likely did, in fact, have a cerebellar stroke and not just post-operative changes as previously noted. Changing primary Dx to cerebellar infarct.   2.  Antithrombotics: -DVT/anticoagulation:  Pharmaceutical: Lovenox 40mg  QD             -antiplatelet therapy: Aspirin 81 mg daily  3. Pain Management: Tylenol as needed, Diazepam 5mg  q8h PRN muscle spasms, Voltaren 1% 2g QID - 3/29: Schedule Tylenol 1000 mg TID, add gabapentin 300 mg QHS for headache and sleep -08/02/22 pt previously on Hydrocodone-APAP 5-325mg  at prior discharge, was still using this, would like to keep; reordered for now, 5-325mg  q6h PRN x7 days, if needed longer then weekday team to discuss; adjusted tylenol to 650mg  TID to avoid overdosing of tylenol; would also prefer gabapentin be scheduled BID rather than PRN -08/03/22 - 4/1 improved control, cont regimen for now  4. Mood/Behavior/Sleep: Zoloft 50 mg daily             -antipsychotic agents: N/A   -3/29: Gabapentin as above, melatonin 5mg  QHS PRN  -08/02/22 scheduled Gabapentin BID and melatonin QHS per request  -08/03/22 greatly improved sleep - continue   - 4/2: Added trazodone 50 mg PRN for ?insomnia; not endorsed by patient.   -4-3: Patient endorses ongoing insomnia, uncertain etiology.  As needed trazodone not utilized, will schedule starting tonight. - sleep stable  5. Neuropsych/cognition: This patient is capable of making decisions on his own behalf. 6. Skin/Wound Care: Routine skin checks 7. Dysphagia/Fluids/Electrolytes/Nutrition: Routine in and outs with follow-up chemistries  - 3/29: Cr stable, BUN increased, encourage PO fluids and re-test Monday--ordered weekly; stable CR, BUN improved  4/1 -08/02/22 takes Vitamin D 2000IU QD and Calcium supplement at home; reordered  8.  Hyperlipidemia.  Crestor 20mg  daily 9.   Rheumatoid arthritis.  Consider resuming home regimen of rituximab, methotrexate and sulfasalazine after steroids completed (4/10).  Currently on Dexamethasone with taper. -08/02/22 wife concerned with restarting of meds; advised to readdress this once steroids have tapered around 08/13/22   10.  CAD.  Continue low-dose aspirin. Denies CP 11.  Dysphagia. Regular diet with nectar liquids. Follow up SLP 12.  Chronic systolic congestive heart failure.  Monitor for any signs of fluid overload. Daily weight. Currently HOLDING Spironolactone 12.5mg  QD (halved during last hospital  stay), Lisinopril 10mg  QD, Metoprolol 25mg  QD  -3/30-4/1 wt stable, BP stable, continue to monitor  - 4/-: weights downtrending, monitor Filed Weights   08/05/22 0500 08/06/22 0439 08/07/22 0504  Weight: 91.9 kg 89.6 kg 88.6 kg      08/07/2022    5:04 AM 08/06/2022    7:37 PM 08/06/2022    2:34 PM  Vitals with BMI  Weight 195 lbs 5 oz    BMI [id number]    Systolic 97 0000000 [id number]  Diastolic 66 88 84  Pulse 69 83 101      13.  OSA. Intolerance to CPAP. Counseling on CPAP benefits.  14. Prediabetes. A1C 6.0. Monitor on CBC/CMP 15. Leukocytosis. Likely due to steroids. Stable. Monitor for s/s infection.    - 4/12: 10.6; monitor  4-3, recheck today due to cognitive changes above - mild increase to 12, no s/s infection, repeat in AM  16. Macular degeneration: takes Preservision AREDS2 and Systane 0.6% eye drops at home -08/02/22 ordered hospital formulary alternatives- Lacrilube BID and Prosight multivitamin QD - 4/1-4/2: Wife concerned regarding use of alternative to systane; recommended bringing in from home  - 4.4: Pharmacy to assist in nonformulary order for home systane  17. R wrist bruising - stable, improving -08/02/22 wife concerned with bruises to R wrist, appear older/healing, no tenderness or crepitus/deformities, no swelling. Suspect minor pressure related bruise, doubt underlying bony injury, doubt need for xray; likely  related to blood thinners; monitor for now  - 4/2: Mild HgB drop 10.6->9.8-> 10.1 4/3  18.  Hydrocephalus s/p AVM resection and pseudomeningocele repair   - 4/3: Cognitive slowing, increased dysarthria and ataxia.  See above, CT head and labs today for workup.  No focal weakness or sensory changes, no gross cranial nerve deficits to indicate need for acute stroke workup.  Last known normal would have been at bedtime last night. - 4/4: CT head concerning for worsening hydrocephalus. See neurosirgery documentation, appreciate their recommendations, awaiting family decision on VP shunt   LOS: 7 days A FACE TO FACE EVALUATION WAS [place] 08/07/2022, 12:32 PM

## 2022-08-07 NOTE — Progress Notes (Signed)
I have spoken with the patient and wife after they had an evaluation by my partner, Dr. Reatha Armour. We will plan on proceeding with placement of VP shunt tomorrow around 3p. I have again reviewed the details of the procedure, risks, and expected postop course. All questions today were answered. Assuming no complications, pt can return to CIR immediately post-procedure.  Consuella Lose, MD [facility]

## 2022-08-07 NOTE — Progress Notes (Signed)
Speech Language Pathology Note  Patient Details  Name: Seger Gouker MRN: [id number] Date of Birth: [birth year] Today's Date: 08/07/2022  SLP received report from OT that patient's wife reports overt s/s of aspiration with solid textures. Patient was sleeping but SLP had discussion with wife who reports plan for proceeding with the shunt. She reports increased overt s/s of aspiration with solid textures. Discussed possible downgrade to puree textures but his wife reports he would "hate that." Discussed change to Dys. 2 textures (minced). Patient's wife agreeable to downgrade and educated on continuing report any increased difficulty to nursing. SLP will f/u for tolerance tomorrow.   Cole, [place] 08/07/2022, 2:57 PM

## 2022-08-07 NOTE — Progress Notes (Signed)
Occupational Therapy Session Note  Patient Details  Name: Collen Jenerette MRN: [id number] Date of Birth: 1961/03/23  Today's Date: 08/07/2022 OT Individual Time: [id number] OT Individual Time Calculation (min): 55 min    Short Term Goals: Week 1:  OT Short Term Goal 1 (Week 1): Patient will maintain standing balance at the sink with CGA within functional BADL task OT Short Term Goal 2 (Week 1): Patient will complete LB dressing with min A OT Short Term Goal 3 (Week 1): Patient will tolerate standing for 3 minutes in preparation for BADL tasks  Skilled Therapeutic Interventions/Progress Updates:    Pt greeted sitting in wc with his wife present. Pt expressed that he may need shunt placement, but was agreeable to OT treatment session. Pt declined participating in BADL tasks this morning. Pt brought to therapy gym in wc. We worked on problem solving and fine motor coordination with slideboard puzzle. Pt needed minimal cus forr problem solving, then used B UE's to completed puzzle. Pt with increased ataxia in B UE's compared to two days ago when this therapist saw him last. Addressed UB coordination and balance with standing clothes pin activity. Incorporated trunk rotation with placement of clothes pin rack to the L or R side depending on which UE we were using. Pt more ataxic on the R side today than usual. Graded task by placing clothes pin rack further away to make pt reach further outside base of support and then try to right himself. Pt with losses of balance laterally, posteriorly, and anteriorly. Pt returned to room and pivoted back to bed with moderate assistance. Neurosurgeon entered room and pt left seated EOB with neuro surgeon, wife, and needs met.   Therapy Documentation Precautions:  Precautions Precautions: Fall Restrictions Weight Bearing Restrictions: No Other Position/Activity Restrictions: Ataxia, impulsive, impaired problem-solving Pain: Pain Assessment Pain  Scale: 0-10 Pain Score: 0-No pain   Therapy/Group: Individual Therapy  Valma Cava 08/07/2022, 9:13 AM

## 2022-08-07 NOTE — Progress Notes (Signed)
Patient seen and examined, as requested by Dr. Kathyrn Sheriff, for discussion of second opinion of placement of ventriculoperitoneal shunt. He is awake alert oriented x 3, has dysarthria and significant right upper extremity dysmetria.  Very mild left-sided dysmetria, primarily in the upper extremity.  Incision is well-healed with slight swelling and fluctuance that is soft, consistent with pseudomeningocele.  I reviewed his CT scans and MRI of the brain.  I agree that there is a new fluid collection causing mass effect along the right cerebellum and fourth ventricle compared to prior imaging, as well as a small foci of acute infarction along the superior cerebral hemisphere.  I believe the small foci of acute infarction is not fully causative of his symptomatology, while it could be causing his dysmetria I do not believe would be causing his dysarthria.  I believe his dysarthria in relation to the dysmetria is mostly due to the mass effect along the right cerebellar hemisphere as well as the fourth ventricle.  I had an extensive discussion with the patient and his wife at bedside, we discussed his imaging findings as well as options of a ventriculoperitoneal or lumboperitoneal shunt.  I agree with placement of a ventriculoperitoneal shunt due to his findings of external hydrocephalus and symptomatology from his right cerebellar hemisphere mass effect and fourth ventricle mass effect.  We went over risks, benefits and expected outcomes as well as recovery.  They are going to discuss among some cells and decide next steps.   Elwin Sleight, [facility]

## 2022-08-07 NOTE — Progress Notes (Signed)
Occupational Therapy Note  Patient Details  Name: Christopher Yu MRN: [id number] Date of Birth: 1960-07-05  Today's Date: 08/07/2022 OT Missed Time: 33 Minutes Missed Time Reason: Patient fatigue  Patient and wife  asleep upon OT arrival. Pt's wife awoke, but patient did not. Per spouse, pt with increased difficulty swallowing during lunch and was choking on eggs. Pt's spouse reported that this took a lot out of him and will now likely have procedure for shunt placement tomorrow due to hydrocephalus. Spouse requested to let pt keep resting. Pt left resting in bed with spouse present and needs met.     [name] 08/07/2022, 2:28 PM

## 2022-08-07 NOTE — Progress Notes (Signed)
Physical Therapy Weekly Progress Note  Patient Details  Name: Christopher Yu MRN: [id number] Date of Birth: 02-08-1961  Beginning of progress report period: August 01, 2022 End of progress report period: August 07, 2022  Today's Date: 08/07/2022 PT Individual Time: 1045-1200 PT Individual Time Calculation (min): 75 min   Patient has met 0 of 3 short term goals. Patient is progressing slowly toward his STG and LTG since initial evaluation. Patient's progress with functional mobility has been delayed secondary to progressing medical conditions with potential need for VP shunt. Patient continues to required Min/ModA for all functional mobility with the use of a RW.   Patient continues to demonstrate the following deficits muscle weakness, decreased cardiorespiratoy endurance, impaired timing and sequencing, ataxia, decreased coordination, and decreased motor planning, decreased midline orientation, decreased initiation, decreased attention, decreased awareness, decreased problem solving, decreased safety awareness, decreased memory, and delayed processing, and decreased standing balance and decreased balance strategies and therefore will continue to benefit from skilled PT intervention to increase functional independence with mobility.  Patient progressing toward long term goals..  Continue plan of care.  PT Short Term Goals Week 1:  PT Short Term Goal 1 (Week 1): Patient will perform bed/chair transfer with LRAD and CGA PT Short Term Goal 1 - Progress (Week 1): Progressing toward goal PT Short Term Goal 2 (Week 1): Patient will ambulate >33' with LRAD and CGA PT Short Term Goal 2 - Progress (Week 1): Progressing toward goal PT Short Term Goal 3 (Week 1): Patient will ascend/descend > a flight of stairs with R HR and CGA PT Short Term Goal 3 - Progress (Week 1): Progressing toward goal Week 2:  PT Short Term Goal 1 (Week 2): Patient will perform bed/chair transfer with LRAD and CGA PT  Short Term Goal 2 (Week 2): Patient will ambulate >37' with LRAD and CGA PT Short Term Goal 3 (Week 2): Patient will ascend/descend > a flight of stairs with R HR and CGA  Skilled Therapeutic Interventions/Progress Updates:  Patient greeted supine in bed in room with spouse, Christopher Yu, present and agreeable to PT treatment session. Patient transitioned to EOB with supv where he was able to don tennis shoes with increased time and VC for decreased cadence. Patient stood from EOB with RW and significant posterior LOB with request for patient to return to a seated position due to poor balance. During second attempt at standing, patient was able to perform sit/stand with CGA- Patient then performed stand pivot transfer to wheelchair with CGA and VC for decreased cadence. Patient wheeled to rehab gym for time management and energy conservation.   Patient completed various gait training with lite-gait harness in order to improve overall coordination and mass repetition for improved functional mobility- -Treadmill with B UE support, x5 minutes at 1.35mph -Treadmill with B UE support 2 x 5 minutes at 1.0 mph with 2.5# weights donned to B LE -Overground gait with lite-gait >500'   Therapist providing multimodal cues and facilitation for improved L lateral weight shift/acceptance for improved R LE swing phase/placement throughout all gait trials with minimal improvements noted with increased repetition. Patient required seated rest breaks in between each activity secondary to fatigue.   Patient returned to his room and left sitting upright in the wheelchair with posey belt on, call bell within reach, tray table in front, spouse present and all needs met.    Therapy Documentation Precautions:  Precautions Precautions: Fall Restrictions Weight Bearing Restrictions: No Other Position/Activity Restrictions: Ataxia, impulsive, impaired problem-solving  Therapy/Group: Individual Therapy  Altan Kraai 08/07/2022, 10:42 AM

## 2022-08-08 ENCOUNTER — Encounter (HOSPITAL_COMMUNITY)
Admission: RE | Disposition: A | Discharge status: Other Acute Inpatient Hospital | Payer: Self-pay | Source: Intra-hospital | Attending: Physical Medicine and Rehabilitation

## 2022-08-08 ENCOUNTER — Inpatient Hospital Stay (HOSPITAL_COMMUNITY): Payer: BC Managed Care – PPO

## 2022-08-08 ENCOUNTER — Inpatient Hospital Stay (HOSPITAL_COMMUNITY): Admission: RE | Admit: 2022-08-08 | Payer: BC Managed Care – PPO | Source: Home / Self Care | Admitting: Neurosurgery

## 2022-08-08 ENCOUNTER — Encounter (HOSPITAL_COMMUNITY): Payer: Self-pay | Admitting: Physical Medicine and Rehabilitation

## 2022-08-08 ENCOUNTER — Other Ambulatory Visit: Payer: Self-pay

## 2022-08-08 ENCOUNTER — Inpatient Hospital Stay (HOSPITAL_COMMUNITY): Payer: BC Managed Care – PPO | Admitting: Anesthesiology

## 2022-08-08 HISTORY — PX: APPLICATION OF CRANIAL NAVIGATION: SHX6578

## 2022-08-08 HISTORY — PX: LAPAROSCOPIC REVISION VENTRICULAR-PERITONEAL (V-P) SHUNT: SHX5924

## 2022-08-08 HISTORY — PX: VENTRICULOPERITONEAL SHUNT: SHX204

## 2022-08-08 SURGERY — SHUNT INSERTION VENTRICULAR-PERITONEAL
Anesthesia: General | Site: Head | Laterality: Right

## 2022-08-08 MED ORDER — LIDOCAINE 2% (20 MG/ML) 5 ML SYRINGE
INTRAMUSCULAR | Status: DC | PRN
  Administered 2022-08-08: 80 mg via INTRAVENOUS

## 2022-08-08 MED ORDER — DICLOFENAC SODIUM 1 % EX GEL: 2.0000 g | Freq: Four times a day (QID) | CUTANEOUS | Status: DC

## 2022-08-08 MED ORDER — SUGAMMADEX SODIUM 200 MG/2ML IV SOLN
INTRAVENOUS | Status: DC | PRN
  Administered 2022-08-08: 200 mg via INTRAVENOUS

## 2022-08-08 MED ORDER — BACITRACIN ZINC 500 UNIT/GM EX OINT
TOPICAL_OINTMENT | CUTANEOUS | Status: DC | PRN
  Administered 2022-08-08: 1 via TOPICAL

## 2022-08-08 MED ORDER — ACETAMINOPHEN 500 MG PO TABS
1000.0000 mg | ORAL_TABLET | Freq: Once | ORAL | Status: DC
  Filled 2022-08-08: qty 2

## 2022-08-08 MED ORDER — HYDROCODONE-ACETAMINOPHEN 5-325 MG PO TABS: 1.0000 | ORAL_TABLET | Freq: Four times a day (QID) | ORAL | 0 refills | Status: DC | PRN

## 2022-08-08 MED ORDER — CHLORHEXIDINE GLUCONATE 0.12 % MT SOLN
OROMUCOSAL | Status: AC
  Administered 2022-08-08: 15 mL via OROMUCOSAL
  Filled 2022-08-08: qty 15

## 2022-08-08 MED ORDER — FENTANYL CITRATE (PF) 100 MCG/2ML IJ SOLN: 25.0000 ug | INTRAMUSCULAR | Status: DC | PRN

## 2022-08-08 MED ORDER — THROMBIN 5000 UNITS EX SOLR: OROMUCOSAL | Status: DC | PRN

## 2022-08-08 MED ORDER — ORAL CARE MOUTH RINSE: 15.0000 mL | Freq: Once | OROMUCOSAL | Status: AC

## 2022-08-08 MED ORDER — ERGOCALCIFEROL 200 MCG/ML PO SOLN: 2000.0000 [IU] | Freq: Every day | ORAL | Status: DC

## 2022-08-08 MED ORDER — CHLORHEXIDINE GLUCONATE 0.12 % MT SOLN: 15.0000 mL | Freq: Once | OROMUCOSAL | Status: AC

## 2022-08-08 MED ORDER — HYDROMORPHONE HCL 1 MG/ML IJ SOLN
0.5000 mg | Freq: Once | INTRAMUSCULAR | Status: AC
  Administered 2022-08-08: 0.5 mg via INTRAVENOUS
  Filled 2022-08-08: qty 0.5

## 2022-08-08 MED ORDER — DEXAMETHASONE SODIUM PHOSPHATE 10 MG/ML IJ SOLN
INTRAMUSCULAR | Status: DC | PRN
  Administered 2022-08-08: 10 mg via INTRAVENOUS

## 2022-08-08 MED ORDER — ACETAMINOPHEN 325 MG PO TABS: 650.0000 mg | ORAL_TABLET | Freq: Three times a day (TID) | ORAL | Status: AC

## 2022-08-08 MED ORDER — ARTIFICIAL TEARS OPHTHALMIC OINT: TOPICAL_OINTMENT | Freq: Two times a day (BID) | OPHTHALMIC | Status: AC

## 2022-08-08 MED ORDER — MELATONIN 5 MG PO TABS: 5.0000 mg | ORAL_TABLET | Freq: Every day | ORAL | 0 refills | Status: DC

## 2022-08-08 MED ORDER — LIDOCAINE-EPINEPHRINE 1 %-1:100000 IJ SOLN
INTRAMUSCULAR | Status: DC | PRN
  Administered 2022-08-08: 3 mL

## 2022-08-08 MED ORDER — ONDANSETRON HCL 4 MG/2ML IJ SOLN
INTRAMUSCULAR | Status: DC | PRN
  Administered 2022-08-08: 4 mg via INTRAVENOUS

## 2022-08-08 MED ORDER — DEXAMETHASONE SODIUM PHOSPHATE 4 MG/ML IJ SOLN
1.0000 mg | Freq: Two times a day (BID) | INTRAMUSCULAR | Status: AC
  Administered 2022-08-09 – 2022-08-10 (×3): 1 mg via INTRAVENOUS
  Filled 2022-08-08 (×2): qty 1

## 2022-08-08 MED ORDER — CEFAZOLIN SODIUM 1 G IJ SOLR
INTRAMUSCULAR | Status: AC
  Filled 2022-08-08: qty 20

## 2022-08-08 MED ORDER — BUPIVACAINE HCL (PF) 0.5 % IJ SOLN
INTRAMUSCULAR | Status: AC
  Filled 2022-08-08: qty 30

## 2022-08-08 MED ORDER — ONDANSETRON HCL 4 MG/2ML IJ SOLN
INTRAMUSCULAR | Status: AC
  Filled 2022-08-08: qty 2

## 2022-08-08 MED ORDER — PROMETHAZINE HCL 25 MG/ML IJ SOLN: 6.2500 mg | INTRAMUSCULAR | Status: DC | PRN

## 2022-08-08 MED ORDER — HEMOSTATIC AGENTS (NO CHARGE) OPTIME
TOPICAL | Status: DC | PRN
  Administered 2022-08-08: 1 via TOPICAL

## 2022-08-08 MED ORDER — PROPOFOL 10 MG/ML IV BOLUS
INTRAVENOUS | Status: AC
  Filled 2022-08-08: qty 20

## 2022-08-08 MED ORDER — DEXAMETHASONE SODIUM PHOSPHATE 4 MG/ML IJ SOLN
1.0000 mg | Freq: Once | INTRAMUSCULAR | Status: AC
  Administered 2022-08-08: 1 mg via INTRAVENOUS

## 2022-08-08 MED ORDER — PHENYLEPHRINE HCL-NACL 20-0.9 MG/250ML-% IV SOLN
INTRAVENOUS | Status: DC | PRN
  Administered 2022-08-08: 30 ug/min via INTRAVENOUS

## 2022-08-08 MED ORDER — DEXAMETHASONE SODIUM PHOSPHATE 4 MG/ML IJ SOLN: 1.0000 mg | Freq: Two times a day (BID) | INTRAMUSCULAR | Status: DC

## 2022-08-08 MED ORDER — 0.9 % SODIUM CHLORIDE (POUR BTL) OPTIME
TOPICAL | Status: DC | PRN
  Administered 2022-08-08: 1000 mL

## 2022-08-08 MED ORDER — THROMBIN 5000 UNITS EX SOLR
CUTANEOUS | Status: DC | PRN
  Administered 2022-08-08 (×2): 5000 [IU] via TOPICAL

## 2022-08-08 MED ORDER — SODIUM CHLORIDE 0.9 % IV SOLN: INTRAVENOUS | Status: DC

## 2022-08-08 MED ORDER — ROCURONIUM BROMIDE 10 MG/ML (PF) SYRINGE
PREFILLED_SYRINGE | INTRAVENOUS | Status: AC
  Filled 2022-08-08: qty 10

## 2022-08-08 MED ORDER — LIDOCAINE 2% (20 MG/ML) 5 ML SYRINGE
INTRAMUSCULAR | Status: AC
  Filled 2022-08-08: qty 5

## 2022-08-08 MED ORDER — ACETAMINOPHEN 10 MG/ML IV SOLN
INTRAVENOUS | Status: AC
  Filled 2022-08-08: qty 100

## 2022-08-08 MED ORDER — THROMBIN 5000 UNITS EX SOLR
CUTANEOUS | Status: AC
  Filled 2022-08-08: qty 5000

## 2022-08-08 MED ORDER — MIDAZOLAM HCL 2 MG/2ML IJ SOLN
INTRAMUSCULAR | Status: AC
  Filled 2022-08-08: qty 2

## 2022-08-08 MED ORDER — ROCURONIUM BROMIDE 10 MG/ML (PF) SYRINGE
PREFILLED_SYRINGE | INTRAVENOUS | Status: DC | PRN
  Administered 2022-08-08: 10 mg via INTRAVENOUS
  Administered 2022-08-08: 60 mg via INTRAVENOUS

## 2022-08-08 MED ORDER — PROPOFOL 10 MG/ML IV BOLUS
INTRAVENOUS | Status: DC | PRN
  Administered 2022-08-08: 150 mg via INTRAVENOUS

## 2022-08-08 MED ORDER — CEFAZOLIN SODIUM-DEXTROSE 2-3 GM-%(50ML) IV SOLR
INTRAVENOUS | Status: DC | PRN
  Administered 2022-08-08: 2 g via INTRAVENOUS

## 2022-08-08 MED ORDER — ACETAMINOPHEN 10 MG/ML IV SOLN
INTRAVENOUS | Status: DC | PRN
  Administered 2022-08-08: 1000 mg via INTRAVENOUS

## 2022-08-08 MED ORDER — FENTANYL CITRATE (PF) 250 MCG/5ML IJ SOLN
INTRAMUSCULAR | Status: AC
  Filled 2022-08-08: qty 5

## 2022-08-08 MED ORDER — ENOXAPARIN SODIUM 40 MG/0.4ML IJ SOSY
40.0000 mg | PREFILLED_SYRINGE | INTRAMUSCULAR | Status: DC
  Administered 2022-08-10 – 2022-08-26 (×17): 40 mg via SUBCUTANEOUS
  Filled 2022-08-08 (×17): qty 0.4

## 2022-08-08 MED ORDER — BACITRACIN ZINC 500 UNIT/GM EX OINT
TOPICAL_OINTMENT | CUTANEOUS | Status: AC
  Filled 2022-08-08: qty 28.35

## 2022-08-08 MED ORDER — FENTANYL CITRATE (PF) 250 MCG/5ML IJ SOLN
INTRAMUSCULAR | Status: DC | PRN
  Administered 2022-08-08: 50 ug via INTRAVENOUS
  Administered 2022-08-08: 100 ug via INTRAVENOUS

## 2022-08-08 MED ORDER — DEXAMETHASONE SODIUM PHOSPHATE 10 MG/ML IJ SOLN
INTRAMUSCULAR | Status: AC
  Filled 2022-08-08: qty 1

## 2022-08-08 MED ORDER — PHENYLEPHRINE 80 MCG/ML (10ML) SYRINGE FOR IV PUSH (FOR BLOOD PRESSURE SUPPORT)
PREFILLED_SYRINGE | INTRAVENOUS | Status: DC | PRN
  Administered 2022-08-08: 120 ug via INTRAVENOUS

## 2022-08-08 MED ORDER — ENOXAPARIN SODIUM 40 MG/0.4ML IJ SOSY: 40.0000 mg | PREFILLED_SYRINGE | INTRAMUSCULAR | Status: DC

## 2022-08-08 MED ORDER — DEXAMETHASONE SODIUM PHOSPHATE 4 MG/ML IJ SOLN
1.0000 mg | Freq: Every day | INTRAMUSCULAR | Status: AC
  Administered 2022-08-11 – 2022-08-13 (×3): 1 mg via INTRAVENOUS
  Filled 2022-08-08 (×3): qty 1

## 2022-08-08 MED ORDER — BUPIVACAINE HCL (PF) 0.5 % IJ SOLN
INTRAMUSCULAR | Status: DC | PRN
  Administered 2022-08-08: 4 mL
  Administered 2022-08-08: 3 mL

## 2022-08-08 MED ORDER — THROMBIN 5000 UNITS EX SOLR
CUTANEOUS | Status: AC
  Filled 2022-08-08: qty 10000

## 2022-08-08 MED ORDER — TRAZODONE HCL 50 MG PO TABS: 50.0000 mg | ORAL_TABLET | Freq: Every day | ORAL | Status: DC

## 2022-08-08 MED ORDER — CALCIUM CARBONATE ANTACID 500 MG PO CHEW: 1.0000 | CHEWABLE_TABLET | Freq: Every day | ORAL | Status: AC

## 2022-08-08 MED ORDER — LIDOCAINE-EPINEPHRINE 1 %-1:100000 IJ SOLN
INTRAMUSCULAR | Status: AC
  Filled 2022-08-08: qty 1

## 2022-08-08 MED ORDER — PHENYLEPHRINE 80 MCG/ML (10ML) SYRINGE FOR IV PUSH (FOR BLOOD PRESSURE SUPPORT)
PREFILLED_SYRINGE | INTRAVENOUS | Status: AC
  Filled 2022-08-08: qty 10

## 2022-08-08 SURGICAL SUPPLY — 83 items
ADH SKN CLS APL DERMABOND .7 (GAUZE/BANDAGES/DRESSINGS) ×2
BAG COUNTER SPONGE SURGICOUNT (BAG) ×4 IMPLANT
BAG SPNG CNTER NS LX DISP (BAG) ×4
BLADE CLIPPER SURG (BLADE) ×4 IMPLANT
BLADE SURG 11 STRL SS (BLADE) ×4 IMPLANT
BOOT SUTURE VASCULAR YLW (MISCELLANEOUS)
BUR PRECISION FLUTE 5.0 (BURR) ×2 IMPLANT
CANISTER SUCT 3000ML PPV (MISCELLANEOUS) ×2 IMPLANT
CATH SNAP PUDENZ 6CM (Shunt) IMPLANT
CLAMP SUTURE YELLOW 5 PAIRS (MISCELLANEOUS) IMPLANT
CLIP RANEY DISP (INSTRUMENTS) IMPLANT
COVERAGE SUPPORT O-ARM STEALTH (MISCELLANEOUS) ×2 IMPLANT
DERMABOND ADVANCED .7 DNX12 (GAUZE/BANDAGES/DRESSINGS) ×2 IMPLANT
DRAPE HALF SHEET 40X57 (DRAPES) ×2 IMPLANT
DRAPE INCISE IOBAN 66X45 STRL (DRAPES) ×2 IMPLANT
DRAPE ORTHO SPLIT 77X108 STRL (DRAPES) ×4
DRAPE SURG ORHT 6 SPLT 77X108 (DRAPES) ×4 IMPLANT
DRSG OPSITE POSTOP 3X4 (GAUZE/BANDAGES/DRESSINGS) IMPLANT
DURAPREP 26ML APPLICATOR (WOUND CARE) ×4 IMPLANT
ELECT REM PT RETURN 9FT ADLT (ELECTROSURGICAL) ×2
ELECTRODE REM PT RTRN 9FT ADLT (ELECTROSURGICAL) ×2 IMPLANT
FEE COVERAGE SUPPORT O-ARM (MISCELLANEOUS) ×2 IMPLANT
GAUZE 4X4 16PLY ~~LOC~~+RFID DBL (SPONGE) IMPLANT
GLOVE BIO SURGEON STRL SZ7.5 (GLOVE) IMPLANT
GLOVE BIO SURGEON STRL SZ8 (GLOVE) ×2 IMPLANT
GLOVE BIOGEL PI IND STRL 7.0 (GLOVE) IMPLANT
GLOVE BIOGEL PI IND STRL 7.5 (GLOVE) ×4 IMPLANT
GLOVE BIOGEL PI IND STRL 8 (GLOVE) ×2 IMPLANT
GLOVE ECLIPSE 7.0 STRL STRAW (GLOVE) ×4 IMPLANT
GLOVE EXAM NITRILE XL STR (GLOVE) IMPLANT
GOWN STRL REUS W/ TWL LRG LVL3 (GOWN DISPOSABLE) ×6 IMPLANT
GOWN STRL REUS W/ TWL XL LVL3 (GOWN DISPOSABLE) ×2 IMPLANT
GOWN STRL REUS W/TWL 2XL LVL3 (GOWN DISPOSABLE) IMPLANT
GOWN STRL REUS W/TWL LRG LVL3 (GOWN DISPOSABLE) ×6
GOWN STRL REUS W/TWL XL LVL3 (GOWN DISPOSABLE) ×2
HEMOSTAT POWDER KIT SURGIFOAM (HEMOSTASIS) IMPLANT
HEMOSTAT SURGICEL 2X14 (HEMOSTASIS) IMPLANT
KIT BASIN OR (CUSTOM PROCEDURE TRAY) ×2 IMPLANT
KIT TURNOVER KIT B (KITS) ×2 IMPLANT
MARKER SKIN DUAL TIP RULER LAB (MISCELLANEOUS) ×4 IMPLANT
MARKER SPHERE PSV REFLC NDI (MISCELLANEOUS) ×6 IMPLANT
NDL HYPO 25X1 1.5 SAFETY (NEEDLE) ×2 IMPLANT
NEEDLE HYPO 22GX1.5 SAFETY (NEEDLE) ×2 IMPLANT
NEEDLE HYPO 25X1 1.5 SAFETY (NEEDLE) ×2 IMPLANT
NS IRRIG 1000ML POUR BTL (IV SOLUTION) ×2 IMPLANT
PACK LAMINECTOMY NEURO (CUSTOM PROCEDURE TRAY) ×2 IMPLANT
PAD ARMBOARD 7.5X6 YLW CONV (MISCELLANEOUS) ×6 IMPLANT
PASSER CATH 65CM DISP (NEUROSURGERY SUPPLIES) ×2 IMPLANT
POINTER TRACER AXIEM (INSTRUMENTS) IMPLANT
SHEATH COOK PEEL AWAY SET 9F (SHEATH) ×2 IMPLANT
SHEATH PERITONEAL INTRO 61 (SHEATH) ×2 IMPLANT
SHUNT STRATA 11 SNAP REG (Shunt) IMPLANT
SLEEVE Z-THREAD 5X100MM (TROCAR) ×2 IMPLANT
SOL ELECTROSURG ANTI STICK (MISCELLANEOUS)
SOLUTION ELECTROSURG ANTI STCK (MISCELLANEOUS) ×4 IMPLANT
SPIKE FLUID TRANSFER (MISCELLANEOUS) ×4 IMPLANT
SPONGE SURGIFOAM ABS GEL SZ50 (HEMOSTASIS) ×2 IMPLANT
SPONGE T-LAP 4X18 ~~LOC~~+RFID (SPONGE) IMPLANT
STAPLER SKIN PROX WIDE 3.9 (STAPLE) ×2 IMPLANT
STAPLER VISISTAT 35W (STAPLE) IMPLANT
STRIP CLOSURE SKIN 1/2X4 (GAUZE/BANDAGES/DRESSINGS) IMPLANT
STYLET 2 COIL SINGLE (INSTRUMENTS) IMPLANT
SUT ETHILON 3 0 PS 1 (SUTURE) IMPLANT
SUT NURALON 4 0 TR CR/8 (SUTURE) IMPLANT
SUT SILK 0 TIES 10X30 (SUTURE) ×2 IMPLANT
SUT SILK 3 0 SH 30 (SUTURE) IMPLANT
SUT VIC AB 3-0 SH 27 (SUTURE) ×2
SUT VIC AB 3-0 SH 27X BRD (SUTURE) ×2 IMPLANT
SUT VIC AB 3-0 SH 8-18 (SUTURE) ×4 IMPLANT
SUT VIC AB 4-0 PS2 27 (SUTURE) ×2 IMPLANT
SUT VICRYL 0 UR6 27IN ABS (SUTURE) ×2 IMPLANT
SYR CONTROL 10ML LL (SYRINGE) ×2 IMPLANT
TAG SUTURE CLAMP YLW 5PR (MISCELLANEOUS)
TOWEL GREEN STERILE (TOWEL DISPOSABLE) ×4 IMPLANT
TOWEL GREEN STERILE FF (TOWEL DISPOSABLE) ×2 IMPLANT
TRACKER ENT PATIENT (MISCELLANEOUS) IMPLANT
TROCAR BALLN 12MMX100 BLUNT (TROCAR) IMPLANT
TROCAR XCEL NON-BLD 5MMX100MML (ENDOMECHANICALS) IMPLANT
TROCAR Z-THREAD OPTICAL 5X100M (TROCAR) ×2 IMPLANT
TUBE CONNECTING 12X1/4 (SUCTIONS) ×2 IMPLANT
UNDERPAD 30X36 HEAVY ABSORB (UNDERPADS AND DIAPERS) ×2 IMPLANT
WARMER LAPAROSCOPE (MISCELLANEOUS) ×2 IMPLANT
WATER STERILE IRR 1000ML POUR (IV SOLUTION) ×4 IMPLANT

## 2022-08-08 NOTE — Progress Notes (Signed)
Physical Therapy Session Note  Patient Details  Name: Christopher Yu MRN: [id number] Date of Birth: 05-May-1961  Today's Date: 08/08/2022 PT Missed Time: 60 Minutes Missed Time Reason: Patient fatigue;Other (Comment) (Patient scheduled for OR this afternoon for VP Shunt placement)  Patient greeted supine in bed asleep, but easily arousable- Patient deferred therapy this morning secondary to fatigue and plan for OR this afternoon for VP shunt placement. Patient has missed 60 minutes of PT at this time.   Demont Linford 08/08/2022, 7:29 AM

## 2022-08-08 NOTE — Progress Notes (Signed)
SLP Cancellation Note  Patient Details Name: Christopher Yu MRN: [id number] DOB: [birth year]   Cancelled treatment:       Patient missed 45 minutes of skilled SLP intervention due to a medial hold. Patient is scheduled for a procedure later today.                                                                                                 Amalee Olsen 08/08/2022, 1:16 PM

## 2022-08-08 NOTE — Progress Notes (Signed)
PROGRESS NOTE   Subjective/Complaints: No events ovenright. No acute complaints. Patient to go to OR today for VP shunt at approx 3 pm; has been npo since midnight. Plan to return to IPR post-op if no complications. Wife at bedside, questions answered.   ROS: +HAs-none, +insomnia-improved, +bruises R wrist  - stable. Denies fevers, chills, N/V, abdominal pain, constipation, diarrhea, SOB, cough, chest pain, new weakness or paraesthesias.    Objective:   CT HEAD WO CONTRAST ( )  Result Date: 08/06/2022 CLINICAL DATA:  Mental status change, unknown cause. Pseudomeningocele. EXAM: CT HEAD WITHOUT CONTRAST TECHNIQUE: Contiguous axial images were obtained from the base of the skull through the vertex without intravenous contrast. RADIATION DOSE REDUCTION: This exam was performed according to the departmental dose-optimization program which includes automated exposure control, adjustment of the mA and/or kV according to patient size and/or use of iterative reconstruction technique. COMPARISON:  Head CT 07/29/2022. FINDINGS: Brain: Increased size of the cystic lesion along the roof of the fourth ventricle in the right cerebellar hemisphere, now measuring up to 3.6 x 2.6 cm (image 10 series 4), previously 2.1 x 1.4 cm. Increased mass effect on the inferior fourth ventricle. No acute hydrocephalus. No acute intracranial hemorrhage. Supratentorial cortical gray-white differentiation is preserved. Vascular: No hyperdense vessel or unexpected calcification. Skull: Unchanged postoperative appearance from prior suboccipital craniectomy. Sinuses/Orbits: Unremarkable. Other: Unchanged fluid collection in the dorsal soft tissues underlying the suboccipital craniectomy. IMPRESSION: 1. Increased size of the cystic lesion along the roof of the fourth ventricle in the right cerebellar hemisphere, now measuring up to 3.6 x 2.6 cm, previously 2.1 x 1.4 cm. Increased  mass effect on the inferior fourth ventricle. No acute hydrocephalus. 2. Unchanged fluid collection in the dorsal soft tissues underlying the suboccipital craniectomy. Electronically Signed   By: Orvan Falconer M.D.   On: 08/06/2022 12:16   Recent Labs    08/06/22 1116  WBC 12.0*  HGB 10.1*  HCT 31.3*  PLT 237    Recent Labs    08/06/22 1116  NA 140  K 4.0  CL 102  CO2 29  GLUCOSE 103*  BUN 19  CREATININE 0.86  CALCIUM 9.0     Intake/Output Summary (Last 24 hours) at 08/08/2022 1203 Last data filed at 08/07/2022 1857 Gross per 24 hour  Intake 240 ml  Output --  Net 240 ml         Physical Exam: Vital Signs Blood pressure 99/74, pulse (!) 58, temperature 97.9 F (36.6 C), resp. rate 18, height 6\' 1"  (1.854 m), weight 89.5 kg, SpO2 96 %.    General: NAD, sitting up in bed HEENT:  occipital swelling unchanged from prior exams. PERRLA Neck: Supple without JVD Heart: Reg rate and rhythm. No murmurs rubs or gallops Chest: CTA bilaterally without wheezes, rales, or rhonchi; no distress.  Abdomen: Soft, non-tender, non-distended, bowel sounds positive. Extremities: No clubbing, cyanosis, or edema.   Psych: Pt's affect is appropriate. Pt is cooperative.   Skin: Clean and intact without signs of breakdown, healed incision/scar to posterior neck MsK: small bruise to R wrist over ulnar styloid - resolved  Neuro: Alert and oriented x 4, apparent cognitive slowing in response -  ongoing  + Speech apraxia and dysarthria - slightly worse than yesterday Strength antigravity and against resistance in bilateral upper extremities and lower extremities, no apparent changes from last exam Sensation intact light touch in all 4 extremities + FTN extremely altered R>L today - Severe on R, moderate on L this AM  Musculoskeletal: Normal bulk and tone, no joint swelling noted Healed right TKA incision   Assessment/Plan: 1. Functional deficits which require 3+ hours per day of  interdisciplinary therapy in a comprehensive inpatient rehab setting. Physiatrist is providing close team supervision and 24 hour management of active medical problems listed below. Physiatrist and rehab team continue to assess barriers to discharge/monitor patient progress toward functional and medical goals  Care Tool:  Bathing    Body parts bathed by patient: Right arm, Left arm, Chest, Front perineal area, Abdomen, Buttocks, Left upper leg, Left lower leg, Face, Right lower leg, Right upper leg         Bathing assist Assist Level: Minimal Assistance - Patient > 75%     Upper Body Dressing/Undressing Upper body dressing   What is the patient wearing?: Pull over shirt    Upper body assist Assist Level: Contact Guard/Touching assist    Lower Body Dressing/Undressing Lower body dressing      What is the patient wearing?: Underwear/pull up, Pants     Lower body assist Assist for lower body dressing: Minimal Assistance - Patient > 75%     Toileting Toileting    Toileting assist Assist for toileting: Contact Guard/Touching assist     Transfers Chair/bed transfer  Transfers assist     Chair/bed transfer assist level: Minimal Assistance - Patient > 75%     Locomotion Ambulation   Ambulation assist      Assist level: Minimal Assistance - Patient > 75% Assistive device: Walker-rolling Max distance: 150   Walk 10 feet activity   Assist     Assist level: Minimal Assistance - Patient > 75% Assistive device: Walker-rolling   Walk 50 feet activity   Assist    Assist level: Minimal Assistance - Patient > 75% Assistive device: Walker-rolling    Walk 150 feet activity   Assist    Assist level: Minimal Assistance - Patient > 75% Assistive device: Walker-rolling    Walk 10 feet on uneven surface  activity   Assist     Assist level: Minimal Assistance - Patient > 75% Assistive device: Development worker, international aid     Assist Is the  patient using a wheelchair?: Yes Type of Wheelchair: Manual    Wheelchair assist level: Minimal Assistance - Patient > 75% Max wheelchair distance: 150'    Wheelchair 50 feet with 2 turns activity    Assist        Assist Level: Minimal Assistance - Patient > 75%   Wheelchair 150 feet activity     Assist      Assist Level: Minimal Assistance - Patient > 75%   Blood pressure 99/74, pulse (!) 58, temperature 97.9 F (36.6 C), resp. rate 18, height  (1.854 m), weight 89.5 kg, SpO2 96 %.   Medical Problem List and Plan: 1. Functional deficits secondary to cerebellar CVA, c/b recent pseudomeningocele repair and Hx AVM resection December 2023..     - 4/4: Note Dr. Alanson Puls stating patient likely did, in fact, have a cerebellar stroke and not just post-operative changes as previously noted. Changing primary Dx to cerebellar infarct.           -  patient may shower             -ELOS/Goals: 7-10 days, Mod I PT/OT/SLP; DC goal 4/16             -Admit to CIR -Decadron as indicated 4 MG Q8 HOURS with TAPER STARTING 3/29 to 4/10 - 4/3: Worsening hydrocephalus on CT, pending VP shunt placment 4/5 and return to IPR; see below  2.  Antithrombotics: -DVT/anticoagulation:  Pharmaceutical: Lovenox 40mg  QD             -antiplatelet therapy: Aspirin 81 mg daily  3. Pain Management: Tylenol as needed, Diazepam 5mg  q8h PRN muscle spasms, Voltaren 1% 2g QID - 3/29: Schedule Tylenol 1000 mg TID, add gabapentin 300 mg QHS for headache and sleep -08/02/22 pt previously on Hydrocodone-APAP 5-325mg  at prior discharge, was still using this, would like to keep; reordered for now, 5-325mg  q6h PRN x7 days, if needed longer then weekday team to discuss; adjusted tylenol to 650mg  TID to avoid overdosing of tylenol; would also prefer gabapentin be scheduled BID rather than PRN -08/03/22 - 4/1 improved control, cont regimen for now  4. Mood/Behavior/Sleep: Zoloft 50 mg daily              -antipsychotic agents: N/A   -3/29: Gabapentin as above, melatonin 5mg  QHS PRN  -08/02/22 scheduled Gabapentin BID and melatonin QHS per request  -08/03/22 greatly improved sleep - continue   - 4/2: Added trazodone 50 mg PRN for ?insomnia; not endorsed by patient.   -4-3: Patient endorses ongoing insomnia, uncertain etiology.  As needed trazodone not utilized, will schedule starting tonight. - sleep stable  5. Neuropsych/cognition: This patient is capable of making decisions on his own behalf. 6. Skin/Wound Care: Routine skin checks 7. Dysphagia/Fluids/Electrolytes/Nutrition: Routine in and outs with follow-up chemistries  - 3/29: Cr stable, BUN increased, encourage PO fluids and re-test Monday--ordered weekly; stable CR, BUN improved  4/1; stable 4/3 -08/02/22 takes Vitamin D 2000IU QD and Calcium supplement at home; reordered  8.  Hyperlipidemia.  Crestor 20mg  daily 9.  Rheumatoid arthritis.  Consider resuming home regimen of rituximab, methotrexate and sulfasalazine after steroids completed (4/10).  Currently on Dexamethasone with taper. -08/02/22 wife concerned with restarting of meds; advised to readdress this once steroids have tapered around 08/13/22   10.  CAD.  Continue low-dose aspirin. Denies CP 11.  Dysphagia. Regular diet with nectar liquids. Follow up SLP 12.  Chronic systolic congestive heart failure.  Monitor for any signs of fluid overload. Daily weight. Currently HOLDING Spironolactone 12.5mg  QD (halved during last hospital stay), Lisinopril 10mg  QD, Metoprolol 25mg  QD  -3/30-4/5 wt stable, BP stable, continue to monitor Filed Weights   08/06/22 0439 08/07/22 0504 08/08/22 0500  Weight: 89.6 kg 88.6 kg 89.5 kg      08/08/2022    6:09 AM 08/08/2022    5:00 AM 08/07/2022    8:45 PM  Vitals with BMI  Weight  197 lbs 5 oz   BMI  26.04   Systolic 99  126  Diastolic 74  86  Pulse 58  88      13.  OSA. Intolerance to CPAP. Counseling on CPAP benefits.  14. Prediabetes. A1C  6.0. Monitor on CBC/CMP 15. Leukocytosis. Likely due to steroids. Stable. Monitor for s/s infection.    - 10.2->11->10.6 -> 12 4/3 ; monitor  16. Macular degeneration: takes Preservision AREDS2 and Systane 0.6% eye drops at home -08/02/22 ordered hospital formulary alternatives- Lacrilube BID and Prosight multivitamin QD - 4/1-4/2:  Wife concerned regarding use of alternative to systane; recommended bringing in from home  - 4.4: Pharmacy to assist in nonformulary order for home systane; ordered  17. R wrist bruising - stable, improving -08/02/22 wife concerned with bruises to R wrist, appear older/healing, no tenderness or crepitus/deformities, no swelling. Suspect minor pressure related bruise, doubt underlying bony injury, doubt need for xray; likely related to blood thinners; monitor for now  - 4/2: Mild HgB drop 10.6->9.8-> 10.1 4/3  18.  Hydrocephalus s/p AVM resection and pseudomeningocele repair   - 4/3: Cognitive slowing, increased dysarthria and ataxia.  See above, CT head and labs today for workup.  No focal weakness or sensory changes, no gross cranial nerve deficits to indicate need for acute stroke workup.  Last known normal would have been at bedtime last night. - 4/4: CT head concerning for worsening hydrocephalus. See above.  - 4/5:  Dr. Alanson Puls and Dr. Jake Samples recommending VP shunt, family agreeable, plan to OR 4/5 and return to IPR if no complications  LOS: 8 days A FACE TO FACE EVALUATION WAS PERFORMED  Angelina Sheriff 08/08/2022, 12:03 PM

## 2022-08-08 NOTE — Progress Notes (Signed)
Occupational Therapy Weekly Progress Note  Patient Details  Name: Christopher Yu MRN: [id number] Date of Birth: January 07, 1961  Beginning of progress report period: July 31, 2022 End of progress report period: August 08, 2022   Patient has met 1 of 3 short term goals.  Patient is making slow progress with OT goals. He has had medical set back this week with hydrocephalus and need for shunt placement. Prior to this, pt was making progress. Continue current POC for now.   Patient continues to demonstrate the following deficits: muscle weakness, decreased cardiorespiratoy endurance, impaired timing and sequencing, abnormal tone, unbalanced muscle activation, motor apraxia, ataxia, decreased coordination, and decreased motor planning, decreased midline orientation, decreased attention to left, and decreased motor planning, decreased attention, decreased awareness, decreased problem solving, decreased safety awareness, and delayed processing, and decreased sitting balance, decreased standing balance, decreased postural control, hemiplegia, and decreased balance strategies and therefore will continue to benefit from skilled OT intervention to enhance overall performance with BADL and Reduce care partner burden.  Patient progressing toward long term goals..  Continue plan of care.  OT Short Term Goals Week 1:  OT Short Term Goal 1 (Week 1): Patient will maintain standing balance at the sink with CGA within functional BADL task OT Short Term Goal 1 - Progress (Week 1): Progressing toward goal OT Short Term Goal 2 (Week 1): Patient will complete LB dressing with min A OT Short Term Goal 2 - Progress (Week 1): Met OT Short Term Goal 3 (Week 1): Patient will tolerate standing for 3 minutes in preparation for BADL tasks OT Short Term Goal 3 - Progress (Week 1): Met Week 2:  OT Short Term Goal 1 (Week 2): Patient will maintain dynamic balance during BADL tasks with no more than min A OT Short Term  Goal 2 (Week 2): Patient will reach for objects with R hand and bring to mouth with supervision OT Short Term Goal 3 (Week 2): Patient will tie shoes using B UEs  Therapy/Group: Individual Therapy  Mal Amabile 08/08/2022, 3:13 PM

## 2022-08-08 NOTE — Progress Notes (Signed)
Dr. Conchita Paris at bedside, I clarified whether patient was returning to rehab unit 4W or should he go to ICU or other unit. Dr. Conchita Paris said patient could go back to 4W rehab, there would not be any orders for NIH or q15 or q30 vitals or neuro checks.

## 2022-08-08 NOTE — H&P (Signed)
Christopher Yu is an 62 y.o. male.   Chief Complaint: hydrocephalus HPI: asked to assist Christopher Yu with VP shunt  placement.   Past Medical History:  Diagnosis Date   Anxiety    Arthritis, rheumatoid    dx 1988   CAD (coronary artery disease), native coronary artery    Mild LAD disease with calcification noted at cath 12//27/16    Cardiomyopathy    Cardiomyopathy    Chronic systolic heart failure 05/01/2015   Depression    Deviated nasal septum 06/25/2011   Headache    High cholesterol    Hyperlipidemia    Impingement syndrome of left shoulder 12/22/2017   Macular degeneration    Rheumatoid aortitis    Sleep apnea    does not use cpap - UNABLE TO TOLERATE MASK   Sleep apnea in adult    deviated septum repaired, most recent sleep study was negative   Status post total right knee replacement 06/01/2015    Past Surgical History:  Procedure Laterality Date   ANKLE FUSION Right 05/05/2012   related to his arthritis   ANKLE FUSION Left 05/25/2013   related to his arthritis   ANKLE SURGERY Bilateral    APPLICATION OF CRANIAL NAVIGATION N/A 04/11/2022   Procedure: APPLICATION OF CRANIAL NAVIGATION;  Surgeon: Christopher RenshawNundkumar, Neelesh, MD;  Location: [facility] OR;  Service: Neurosurgery;  Laterality: N/A;   CARDIAC CATHETERIZATION N/A 05/01/2015   Procedure: Left Heart Cath and Coronary Angiography;  Surgeon: Christopher RecordsHenry W Smith, MD;  [facility];  Service: Cardiovascular;  Laterality: N/A;   CRANIOTOMY N/A 04/11/2022   Procedure: STEREOTACTIC SUBOCCIPITAL CRANIOTOMY FOR RESECTION OF ARTERIO-VENOUS MALFORMATION;  Surgeon: Christopher RenshawNundkumar, Neelesh, MD;  Location: [facility] OR;  Service: Neurosurgery;  Laterality: N/A;   FRACTURE SURGERY     left femur fracture x 3   IR ANGIO EXTERNAL CAROTID SEL EXT CAROTID BILAT MOD SED  01/28/2022   IR ANGIO INTRA EXTRACRAN SEL INTERNAL CAROTID BILAT MOD SED  01/28/2022   IR ANGIO INTRA EXTRACRAN SEL INTERNAL CAROTID BILAT MOD SED  04/16/2022    IR ANGIO VERTEBRAL SEL VERTEBRAL BILAT MOD SED  01/28/2022   IR ANGIO VERTEBRAL SEL VERTEBRAL BILAT MOD SED  04/16/2022   KNEE ARTHROSCOPY     right x 4   LUMBAR LAMINECTOMY/DECOMPRESSION MICRODISCECTOMY N/A 07/18/2022   Procedure: Repair of Pseudomeningiocele posterior;  Surgeon: Christopher RenshawNundkumar, Neelesh, MD;  Location: [facility] OR;  Service: Neurosurgery;  Laterality: N/A;   NASAL SEPTOPLASTY W/ TURBINOPLASTY  06/25/2011   Procedure: NASAL SEPTOPLASTY WITH TURBINATE REDUCTION;  Surgeon: Christopher Cohoavid Shoemaker, MD;  [facility];  Service: ENT;  Laterality: Bilateral;   PLACEMENT OF LUMBAR DRAIN N/A 07/18/2022   Procedure: PLACEMENT OF LUMBAR DRAIN;  Surgeon: Christopher RenshawNundkumar, Neelesh, MD;  Location: [facility] OR;  Service: Neurosurgery;  Laterality: N/A;   TONSILLECTOMY     TOTAL KNEE ARTHROPLASTY Right 06/01/2015   Procedure: RIGHT TOTAL KNEE ARTHROPLASTY;  Surgeon: Christopher Hitchhristopher Y Blackman, MD;  Location: [facility] ORS;  Service: Orthopedics;  Laterality: Right;  Block+general    Family History  Problem Relation Age of Onset   Lupus Sister    Social History:  reports that he quit smoking about 14 years ago. His smoking use included cigarettes. He has a 10.00 pack-year smoking history. He has never used smokeless tobacco. He reports current alcohol use. He reports that he does not use drugs.  Allergies: No Known Allergies  Medications Prior to Admission  Medication Sig Dispense Refill   aspirin 81 MG tablet  Take 81 mg by mouth daily.     CALCIUM PO Take 1 tablet by mouth daily.     Cholecalciferol (VITAMIN D3) 50 MCG (2000 UT) TABS Take 2,000 Units by mouth daily.     clindamycin (CLEOCIN) 150 MG capsule Take 600 mg by mouth See admin instructions. Take 600mg  by mouth an hour prior to dental appointment.     diazepam (VALIUM) 5 MG tablet Take 1 tablet (5 mg total) by mouth every 8 (eight) hours as needed for muscle spasms. 30 tablet 0   fluticasone (FLONASE) 50 MCG/ACT nasal spray Place 1 spray into both nostrils daily as  needed for allergies or rhinitis.     gabapentin (NEURONTIN) 300 MG capsule Take 300 mg by mouth 2 (two) times daily.     [EXPIRED] HYDROcodone-acetaminophen (NORCO/VICODIN) 5-325 MG tablet Take 1 tablet by mouth every 4 (four) hours as needed for up to 7 days for moderate pain. (Patient not taking: Reported on 07/30/2022) 30 tablet 0   lisinopril (ZESTRIL) 10 MG tablet TAKE 1 TABLET DAILY (Patient not taking: Reported on 05/09/2022) 90 tablet 3   methotrexate (RHEUMATREX) 2.5 MG tablet Take 20 mg by mouth once a week. Takes 8 tablets(20mg ) every Saturday.     Caution:Chemotherapy. Protect from light.     metoprolol succinate (TOPROL-XL) 25 MG 24 hr tablet TAKE 1 TABLET DAILY 90 tablet 2   Multiple Vitamins-Minerals (PRESERVISION AREDS 2 PO) Take 2 capsules by mouth daily.     predniSONE (DELTASONE) 5 MG tablet Take 20 mg by mouth daily as needed (Arthritis).     Propylene Glycol (SYSTANE COMPLETE) 0.6 % SOLN Place 1 drop into both eyes 2 (two) times daily.     RITUXAN 500 MG/50ML injection See admin instructions. Every 6 months 2 dose infusion Ruxience (Patient not taking: Reported on 04/22/2022)     rosuvastatin (CRESTOR) 20 MG tablet Take 1 tablet (20 mg total) by mouth daily. 90 tablet 3   sertraline (ZOLOFT) 50 MG tablet Take 50 mg by mouth every morning.     spironolactone (ALDACTONE) 25 MG tablet TAKE 1 TABLET DAILY (Patient taking differently: Take 12.5 mg by mouth daily.) 90 tablet 2   sulfaSALAzine (AZULFIDINE) 500 MG tablet Take 1,000 mg by mouth 2 (two) times daily.      traMADol (ULTRAM) 50 MG tablet Take 50 mg by mouth every 6 (six) hours as needed for moderate pain or severe pain.      Results for orders placed or performed during the hospital encounter of 07/31/22 (from the past 48 hour(s))  Glucose, capillary     Status: Abnormal   Collection Time: 08/07/22  5:56 AM  Result Value Ref Range   Glucose-Capillary 114 (H) 70 - 99 mg/dL    Comment: Glucose reference range applies only  to samples taken after fasting for at least 8 hours.   No results found.  Review of Systems  All other systems reviewed and are negative.   Blood pressure 122/82, pulse 78, temperature 98 F (36.7 C), temperature source Oral, resp. rate 20, height 6\' 1"  (1.854 m), weight 90 kg, SpO2 94 %. Physical Exam Abdominal:     General: Abdomen is flat.     Palpations: Abdomen is soft.      Assessment/Plan Lap assisted VP shunt placement  DW family at bedside questions answered  The procedure has been discussed with the patient.  Alternative therapies have been discussed with the patient.  Operative risks include bleeding,  Infection,  Organ  injury,  Nerve injury,  Blood vessel injury,  DVT,  Pulmonary embolism,  Death,  And possible reoperation.  Medical management risks include worsening of present situation.  The success of the procedure is 50 -90 % at treating patients symptoms.  The patient understands and agrees to proceed.   Dortha Schwalbe, MD 08/08/2022, 3:16 PM

## 2022-08-08 NOTE — Progress Notes (Signed)
Patient ID: Christopher Yu, male   DOB: 1961-03-07, 62 y.o.   MRN: [id number]   On call provider called as patient reported at the start of the shift having pressure like sensation and headache pain. Per report from AM Nurse patient received PRN medication for headache in which patient rated pain as a "10". During initial assessment with patient rated pain again as a "10". Unable to administer any additional PRN medication for pain at that time. Upon contacting the on-call Provider given verbal orders for one time dose 0.5 mg HYDROmorphone intravenously. Medication administered at 2159. Observed patient resting in bed shortly after and asleep by 0030 after repositioning him in bed. Vitals taken at 0030.

## 2022-08-08 NOTE — Progress Notes (Signed)
Patient ID: Christopher Yu, male   DOB: 07-24-1960, 62 y.o.   MRN: [id number]  Patient returned from PACU. VS stable. Port sites CDI. Honeycomb dressings to head CDI. Patient reports pain in head, medication given. Spouse ordered dinner tray. IV fluids stopped. Patient resting comfortably. Family at bedside.

## 2022-08-08 NOTE — Op Note (Signed)
Preoperative diagnosis: Peritoneal access for VP shunt placement due to hydrocephalus  Postoperative diagnosis: Same  Procedure: Diagnostic laparoscopy with placement of intraperitoneal portion of ventriculoperitoneal shunt Surgeon: Dr Patric Dykes MD  CoSurgeon: Harriette Bouillon, MD  Anesthesia: General with 0.25% Marcaine plain  EBL: Minimal  Specimen: None  Indications for procedure: The patient is a 62 year old male who has hydrocephalus and is in need of a VP shunt.  I was asked to assist the neurosurgeon Dr. Conchita Paris placement in the abdomen.  I met with the patient's family preop and describe the procedure and the understood my role in his care.The procedure has been discussed with the patient.  Alternative therapies have been discussed with the patient.  Operative risks include bleeding,  Infection,  Organ injury,  Nerve injury,  Blood vessel injury,  DVT,  Pulmonary embolism,  Death,  And possible reoperation.  Medical management risks include worsening of present situation.  The success of the procedure is 50 -90 % at treating patients symptoms.  The patient understands and agrees to proceed.    Description of procedure: The patient was taken to the operating placed supine.  After adequate level general esthesia initiated, the abdomen, chest, neck and head were prepped and draped in sterile fashion timeout performed.  Dr. Conchita Paris performed the her surgical port the procedure.  After timeout was performed, a 5 mm incision was made in the left mid abdomen.  This was in the anterior axial line.  A 5 mm Optiview port was placed this was advanced through all abdominal layers under direct vision into the abdominal cavity entered.  Once that injury, insufflation to 15 mmHg CO2 was performed laparoscope was placed.  Four-quadrant laparoscopy revealed no gross abnormality.  No evidence of bowel injury with insertion of this port.  A second 5 mm port was placed in the left upper quadrant.  The catheter  was tunneled to an epigastric position and a small incision was made to pull the catheter out.  I then used a 5 mm port to create a fascial defect and then passed the catheter through the port entry site into the pelvis.  This was then fed up along the right gutter and exited the epigastrium.  It flushed easily.  Laparoscopy was performed and there is no signs of any bleeding of the port sites nor internal viscera or injury.  We then allowed the CO2 to escape and removed our ports.  All skin incisions were closed with 4-0 Monocryl.  All counts were found to be correct.  The patient was stable extubated taken to recovery.

## 2022-08-08 NOTE — Anesthesia Procedure Notes (Signed)
Procedure Name: Intubation Date/Time: 08/08/2022 3:58 PM  Performed by: Pearson Grippe, CRNAPre-anesthesia Checklist: Patient identified, Emergency Drugs available, Suction available and Patient being monitored Patient Re-evaluated:Patient Re-evaluated prior to induction Oxygen Delivery Method: Circle system utilized Preoxygenation: Pre-oxygenation with 100% oxygen Induction Type: IV induction Ventilation: Mask ventilation without difficulty Laryngoscope Size: Miller and 2 Grade View: Grade III Tube type: Oral Tube size: 7.5 mm Number of attempts: 1 Airway Equipment and Method: Stylet and Oral airway Placement Confirmation: ETT inserted through vocal cords under direct vision, positive ETCO2 and breath sounds checked- equal and bilateral Secured at: 23 cm Tube secured with: Tape Dental Injury: Teeth and Oropharynx as per pre-operative assessment

## 2022-08-08 NOTE — Progress Notes (Signed)
Speech Language Pathology Weekly Progress Note  Patient Details  Name: Christopher Yu MRN: [id number] Date of Birth: 1960-12-16  Beginning of progress report period: July 31, 2022 End of progress report period: August 08, 2022   Short Term Goals: Week 1: SLP Short Term Goal 1 (Week 1): Patient will consume curent diet with Min verbal cues for use of swallowing compensatory strategies. SLP Short Term Goal 1 - Progress (Week 1): Not met SLP Short Term Goal 2 (Week 1): Patient will utilize speech intelligibility strategies at the phrase level to achieve ~90% intelligibility with Min verbal cues. SLP Short Term Goal 2 - Progress (Week 1): Not met SLP Short Term Goal 3 (Week 1): Patient will demonstrate sustained attention to functional tasks for 30 minutes with  min verbal cues for redirection. SLP Short Term Goal 3 - Progress (Week 1): Not met SLP Short Term Goal 4 (Week 1): Patient will self-monitor and correct communication breakdowns with Mod verbal cues. SLP Short Term Goal 4 - Progress (Week 1): Not met SLP Short Term Goal 5 (Week 1): Patient will self-monitor and correct wet vocal quality with mod verbal cues. SLP Short Term Goal 5 - Progress (Week 1): Not met    New Short Term Goals: Week 2: SLP Short Term Goal 1 (Week 2): Patient will self-monitor and correct wet vocal quality with mod verbal cues. SLP Short Term Goal 2 (Week 2): Patient will self-monitor and correct communication breakdowns with Mod verbal cues. SLP Short Term Goal 3 (Week 2): Patient will utilize diaphragmtic breathing at the short phrase level to achieve ~80% intelligibility with Mod verbal and visual cues. SLP Short Term Goal 4 (Week 2): Patient will demonstrate sustained attention to functional tasks for 30 minutes with min verbal cues for redirection. SLP Short Term Goal 5 (Week 2): Patient will consume curent diet with Min verbal cues for use of swallowing compensatory strategies.  Weekly Progress  Updates: Patient has made minimal gains and has not met any STGs this reporting period. Patient's overall functional progress has been delayed secondary to progressing medical conditions with need for VP shunt (procedure is planned for later today). Patient continues to required Max A multimodal cues for use of speech intelligibility strategies at the phrase level, Mod-Max A for sustained attention and emergent awareness, and Mod-Max A verbal cues for use of swallowing compensatory strategies with current diet of Dys. 2 textures with nectar-thick liquids resulting in intermittent overt s/s of aspiration. Patient and family education ongoing. Patient would benefit from continued skilled SLP intervention to maximize his cognitive and swallowing function as well as his functional communication prior to discharge.    Intensity: Minumum of 1-2 x/day, 30 to 90 minutes Frequency: 1 to 3 out of 7 days Duration/Length of Stay: 08/19/22 Treatment/Interventions: Cognitive remediation/compensation;Cueing hierarchy;Dysphagia/aspiration precaution training;Environmental controls;Functional tasks;Internal/external aids;Speech/Language facilitation;Patient/family education;Therapeutic Activities     Kamill Fulbright 08/08/2022, 1:21 PM

## 2022-08-08 NOTE — Progress Notes (Signed)
  NEUROSURGERY PROGRESS NOTE   No issues overnight.  EXAM:  BP 122/82   Pulse 78   Temp 98 F (36.7 C) (Oral)   Resp 20   Ht 6\' 1"  (1.854 m)   Wt 90 kg   SpO2 94%   BMI 26.18 kg/m   Awake, alert, oriented  Speech dysarthric, appropriate  5/5 BUE/BLE  Right sided ataxia  IMPRESSION:  62 y.o. male with external HCP after previous AVM resection and more recent pseudomeningocele repair.  PLAN: - Will proceed with lap assisted right VP shunt placement  We have discussed the details of the procedure, risks, benefits, and alternatives. All questions today were answered and informed consent was obtained.   Lisbeth Renshaw, MD [facility]

## 2022-08-08 NOTE — Op Note (Signed)
NEUROSURGERY OPERATIVE NOTE   PREOP DIAGNOSIS: Communicating hydrocephalus  POSTOP DIAGNOSIS: Same  PROCEDURE: 1. Laparoscopic Assisted ventriculoperitoneal shunt placement 2. Use of intraoperative stereotaxy  SURGEON: Dr. Lisbeth Renshaw, MD  CO-SURGEON: Dr. Marca Ancona, MD  ANESTHESIA: General Endotracheal  EBL: Minimal  SPECIMENS: None  DRAINS: None  COMPLICATIONS: None immediate  CONDITION: Stable to postanesthesia care unit  SHUNT PLACED: Med[employer] Strata II, set to 1.5  HISTORY: Christopher Yu is a 62 y.o. male with a complicated history initially involving resection of a posterior fossa arteriovenous malformation.  Subsequent to this the patient was seen in the outpatient neurosurgery clinic with slow but progressively worsening pseudomeningocele and progressive decline in function.  He was ultimately admitted a few weeks ago for placement of a lumbar drain and operative repair of his pseudomeningocele.  After his discharge she was brought back to the hospital with relatively acute onset of dysarthria, ataxia, and imaging demonstrating some recurrence of the pseudomeningocele as well as a cystic cavity developing in the right cerebellum adjacent to the resection cavity.  Because of the recurrence of the pseudomeningocele and his symptoms, it was felt that this was a result of underlying hydrocephalus.  Placement of a ventriculoperitoneal shunt was therefore recommended.  The risks, benefits, and alternatives to the surgery were all reviewed in detail with the patient and his wife.  After all questions were answered informed consent was obtained and witnessed.  PROCEDURE IN DETAIL: The patient was brought to the operating room and transferred to the operative table. After induction of general anesthesia, the patient was positioned on the operative table in the supine position with all pressure points meticulously padded.  The preoperative stereotactic CT scan  was then Co. registered with surface markers utilizing the electromagnetic navigation system until a accuracy of approximately 1.5 mm was achieved.  The skin of the scalp,  neck, chest, and abdomen were prepped in the usual sterile fashion.  After timeout was conducted, the curvilinear right frontal skin incision over Kocher's point was infiltrated with local anesthetic with epinephrine.  Incision was then made sharply and carried down through the galea.  Hemostasis was secured.  High-speed drill was used to create a bur hole over Kocher's point.  The dura was then coagulated and incised.  At this point, the shunt was passed into the retroauricular region with a small counterincision.  The valve was seated subcutaneously.  Shunt passer was then used to pass the distal catheter from the retroauricular incision to the epigastrium.  The distal portion of the catheter was then placed into the peritoneal cavity under laparoscopic guidance by Dr. Luisa Hart, the details of which are dictated in a separate report.  At this point, using standard anatomic landmarks as well as the assistance of the electromagnetic stereotactic system, a 6 cm ventricular catheter was passed into the right frontal horn in 2 passes.  Good clear CSF flow was obtained. The catheter was then connected to the valve apparatus.  The valve was then pumped, and good distal flow was observed through the laparoscope and the peritoneal cavity.  At this point the scalp wounds were irrigated with copious amounts of bacitracin irrigation, and closed using 3-0 Vicryl stitches.  The skin was then closed using standard surgical skin staples.  Sterile dressings were then applied.  At the end of the case all sponge needle and instrument counts were correct.  The patient tolerated the procedure well and was extubated in the room and taken to the postanesthesia care unit  in stable condition.   Lisbeth Renshaw, MD [facility]

## 2022-08-08 NOTE — Progress Notes (Signed)
Occupational Therapy Session Note  Patient Details  Name: Christopher Yu MRN: [id number] Date of Birth: [birth year]  Today's Date: 08/08/2022 OT Missed Time: 45 Minutes Missed Time Reason: MD hold (comment) (MD hold for procedure)  MD hold for shunt procedure this afternoon. Will follow up as pt medically ready for therapy.   [name] [name] 08/08/2022, 3:10 PM

## 2022-08-08 NOTE — Anesthesia Postprocedure Evaluation (Signed)
Anesthesia Post Note  Patient: Christopher Yu  Procedure(s) Performed: LAP ASSITED SHUNT INSERTION VENTRICULAR-PERITONEAL (Right: Head) APPLICATION OF CRANIAL NAVIGATION LAPAROSCOPIC VENTRICULAR-PERITONEAL (V-P) SHUNT     Patient location during evaluation: PACU Anesthesia Type: General Level of consciousness: awake and alert Pain management: pain level controlled Vital Signs Assessment: post-procedure vital signs reviewed and stable Respiratory status: spontaneous breathing, nonlabored ventilation, respiratory function stable and patient connected to nasal cannula oxygen Cardiovascular status: blood pressure returned to baseline and stable Postop Assessment: no apparent nausea or vomiting Anesthetic complications: no  No notable events documented.  Last Vitals:  Vitals:   08/08/22 1710 08/08/22 1725  BP: (!) 140/78 120/79  Pulse: (!) 58 61  Resp: 12 13  Temp: (!) 36.4 C   SpO2: 96% 95%    Last Pain:  Vitals:   08/08/22 1725  TempSrc:   PainSc: 0-No pain                 Promise Weldin S

## 2022-08-08 NOTE — Transfer of Care (Signed)
Immediate Anesthesia Transfer of Care Note  Patient: Christopher Yu  Procedure(s) Performed: LAP ASSITED SHUNT INSERTION VENTRICULAR-PERITONEAL (Right: Head) APPLICATION OF CRANIAL NAVIGATION LAPAROSCOPIC VENTRICULAR-PERITONEAL (V-P) SHUNT  Patient Location: PACU  Anesthesia Type:General  Level of Consciousness: awake and alert   Airway & Oxygen Therapy: Patient Spontanous Breathing and Patient connected to face mask oxygen  Post-op Assessment: Report given to RN and Post -op Vital signs reviewed and stable  Post vital signs: Reviewed and stable  Last Vitals:  Vitals Value Taken Time  BP 140/78 08/08/22 1710  Temp    Pulse 62 08/08/22 1712  Resp 12 08/08/22 1712  SpO2 94 % 08/08/22 1712  Vitals shown include unvalidated device data.  Last Pain:  Vitals:   08/08/22 1459  TempSrc: Oral  PainSc:       Patients Stated Pain Goal: 2 (08/06/22 0818)  Complications: No notable events documented.

## 2022-08-08 NOTE — Progress Notes (Signed)
Physical Therapy Session Note  Patient Details  Name: Taylon Laffitte MRN: [id number] Date of Birth: 1961-04-30  Today's Date: 08/08/2022 PT Missed Time: 45 Minutes Missed Time Reason: MD hold (Comment) (hold for procedure today pt to go for VP shunt placement)  Short Term Goals: Week 1:  PT Short Term Goal 1 (Week 1): Patient will perform bed/chair transfer with LRAD and CGA PT Short Term Goal 1 - Progress (Week 1): Progressing toward goal PT Short Term Goal 2 (Week 1): Patient will ambulate >40' with LRAD and CGA PT Short Term Goal 2 - Progress (Week 1): Progressing toward goal PT Short Term Goal 3 (Week 1): Patient will ascend/descend > a flight of stairs with R HR and CGA PT Short Term Goal 3 - Progress (Week 1): Progressing toward goal  Therapy Documentation Precautions:  Precautions Precautions: Fall Restrictions Weight Bearing Restrictions: No Other Position/Activity Restrictions: Ataxia, impulsive, impaired problem-solving General: PT Amount of Missed Time (min): 45 Minutes PT Missed Treatment Reason: MD hold (Comment) (hold for procedure today pt to go for VP shunt placement)   Therapy/Group: Individual Therapy  Wynn Maudlin, DPT Acute Rehabilitation Services Office [phone]  08/08/22 1:02 PM

## 2022-08-08 NOTE — Anesthesia Preprocedure Evaluation (Addendum)
Anesthesia Evaluation  Patient identified by MRN, date of birth, ID band Patient awake    Reviewed: Allergy & Precautions, NPO status , Patient's Chart, lab work & pertinent test results, reviewed documented beta blocker date and time   Airway Mallampati: III  TM Distance: >3 FB Neck ROM: Full    Dental  (+) Teeth Intact, Dental Advisory Given   Pulmonary sleep apnea , former smoker   Pulmonary exam normal breath sounds clear to auscultation       Cardiovascular hypertension, Pt. on medications and Pt. on home beta blockers + CAD  Normal cardiovascular exam Rhythm:Regular Rate:Normal  Echo 07/29/22: 1. Left ventricular ejection fraction, by estimation, is 40 to 45%. The  left ventricle has mildly decreased function. The left ventricle  demonstrates global hypokinesis. Left ventricular diastolic parameters are  consistent with Grade I diastolic dysfunction (impaired relaxation).   2. Right ventricular systolic function is normal. The right ventricular  size is normal.   3. The mitral valve is normal in structure. No evidence of mitral valve  regurgitation.   4. Suspect bicuspid aortic valve. There is mild low flow-low gradient  aortic stenosis (gradients are underestimated due to low stroke volume).  The aortic valve is bicuspid. There is mild calcification of the aortic  valve. There is moderate thickening of  the aortic valve. Aortic valve regurgitation is not visualized. Mild aortic valve stenosis. Aortic valve mean gradient measures 7.0 mmHg. Aortic valve Vmax measures 1.78 m/s.   5. Aortic dilatation noted. There is moderate dilatation of the aortic  root, measuring 45 mm.     Neuro/Psych  Headaches PSYCHIATRIC DISORDERS Anxiety Depression    HYDROCEPHALUS  cerebellar CVA, c/b recent pseudomeningocele repair and Hx AVM resection December 2023 CVA, Residual Symptoms    GI/Hepatic negative GI ROS, Neg liver ROS,,,   Endo/Other  negative endocrine ROS    Renal/GU negative Renal ROS     Musculoskeletal  (+) Arthritis , Rheumatoid disorders,    Abdominal   Peds  Hematology  (+) Blood dyscrasia, anemia   Anesthesia Other Findings   Reproductive/Obstetrics                             Anesthesia Physical Anesthesia Plan  ASA: 4  Anesthesia Plan: General   Post-op Pain Management: Tylenol PO (pre-op)*   Induction: Intravenous  PONV Risk Score and Plan: 2 and Dexamethasone and Ondansetron  Airway Management Planned: Oral ETT  Additional Equipment:   Intra-op Plan:   Post-operative Plan: Extubation in OR  Informed Consent: I have reviewed the patients History and Physical, chart, labs and discussed the procedure including the risks, benefits and alternatives for the proposed anesthesia with the patient or authorized representative who has indicated his/her understanding and acceptance.     Dental advisory given  Plan Discussed with: CRNA  Anesthesia Plan Comments:         Anesthesia Quick Evaluation

## 2022-08-09 DIAGNOSIS — Z982 Presence of cerebrospinal fluid drainage device: Secondary | ICD-10-CM

## 2022-08-09 MED ORDER — HYDROCODONE-ACETAMINOPHEN 5-325 MG PO TABS
1.0000 | ORAL_TABLET | Freq: Four times a day (QID) | ORAL | Status: DC | PRN
  Administered 2022-08-09 – 2022-08-13 (×7): 1 via ORAL
  Filled 2022-08-09 (×7): qty 1

## 2022-08-09 MED ORDER — MORPHINE SULFATE (PF) 2 MG/ML IV SOLN
4.0000 mg | INTRAVENOUS | Status: DC | PRN
  Administered 2022-08-09 – 2022-08-13 (×7): 4 mg via INTRAVENOUS
  Filled 2022-08-09 (×7): qty 2

## 2022-08-09 NOTE — Progress Notes (Signed)
PROGRESS NOTE   Subjective/Complaints:  Pt upset about his care last night, claims he didn't get any of his medications (many were autoheld due to his procedure). Got VP shunt placed yesterday, having some abd pain and head postop pain. Didn't sleep well due to med issues.  LBM yesterday, urinating fine today. Denies any other complaints or concerns today.   ROS: +HAs/postop head pain, +insomnia-fluctuating, +bruises R wrist- stable, +postop abd pain. Denies fevers, chills, N/V, constipation, diarrhea, SOB, cough, chest pain, new weakness or paraesthesias.    Objective:   CT HEAD WO CONTRAST ( )  Addendum Date: 08/08/2022   ADDENDUM REPORT: 08/08/2022 15:45 ADDENDUM: Impression #2 called by telephone at the time of interpretation on 08/08/2022 at 3:45 pm to provider [facility] , who verbally acknowledged these results. Electronically Signed   By: Jackey Loge D.O.   On: 08/08/2022 15:45   Result Date: 08/08/2022 CLINICAL DATA:  Provided history: Hydrocephalus. Stereotactic protocol for intraoperative navigation. EXAM: CT HEAD WITHOUT CONTRAST TECHNIQUE: Contiguous axial images were obtained from the base of the skull through the vertex without intravenous contrast. RADIATION DOSE REDUCTION: This exam was performed according to the departmental dose-optimization program which includes automated exposure control, adjustment of the mA and/or kV according to patient size and/or use of iterative reconstruction technique. COMPARISON:  Prior head CT examinations 08/06/2022 and earlier. FINDINGS: Brain: Mild generalized cerebral atrophy. 3.4 x 2.7 x 3.0 cm cystic lesion in the right cerebellar hemisphere, not significantly changed in size from the head CT performed yesterday. As before, there is mass effect upon the fourth ventricle without evidence of acute hydrocephalus. There is a 3 mm focus of hyperdensity along the inferior aspect of this  cystic lesion, which was likely present on yesterday's examination but partially obscured by streak/beam hardening artifact on this prior study (see series 3, image 8 on today's examination, as well as series 5, image 57). This finding was not definitively present on the prior head CT of 07/29/2022. This may reflect a tiny focus of acute/subacute hemorrhage or dystrophic calcification. Grossly unchanged fluid collection(s) within the left aspect of the posterior fossa and within the dorsal soft tissues underlying the suboccipital craniectomy at the imaged levels. No demarcated cortical infarct. No midline shift. Vascular: No hyperdense vessel.  Atherosclerotic calcifications. Skull: Prior suboccipital craniectomy. Sinuses/Orbits: No mass or acute finding within the imaged orbits. Mucous retention cysts within the left frontal sinus measuring up to 10 mm. Minimal mucosal thickening and small mucous retention cysts within bilateral ethmoid air cells. Small fluid level within the right sphenoid sinus. IMPRESSION: 1. 3.4 x 2.7 x 3.0 cm cystic lesion within the right cerebellar hemisphere, unchanged in size from yesterday's head CT. As before, there is mass effect upon the fourth ventricle without evidence of acute obstructive hydrocephalus. 2. 3 mm hyperdense focus along the inferior aspect of the cystic lesion, as described and which may reflect a tiny focus of acute/subacute hemorrhage or dystrophic calcification. 3. Fluid collection(s) within the left aspect of the posterior fossa, and within the dorsal soft tissues underlying the suboccipital craniectomy, grossly unchanged at the imaged levels. 4. Mild generalized cerebral atrophy. 5. Paranasal sinus disease, as described. Electronically  Signed: By: Jackey Loge D.O. On: 08/08/2022 15:28   No results for input(s): "WBC", "HGB", "HCT", "PLT" in the last 72 hours. No results for input(s): "NA", "K", "CL", "CO2", "GLUCOSE", "BUN", "CREATININE", "CALCIUM" in the last  72 hours.  Intake/Output Summary (Last 24 hours) at 08/09/2022 1217 Last data filed at 08/09/2022 0742 Gross per 24 hour  Intake 1036 ml  Output 875 ml  Net 161 ml        Physical Exam: Vital Signs Blood pressure (!) 124/94, pulse 98, temperature 98 F (36.7 C), resp. rate 18, height 6\' 1"  (1.854 m), weight (S) 88.1 kg, SpO2 96 %.    General: NAD, sitting up in bed, upset with overnight issues HEENT:  R scalp operative dressings in place, c/d/i. PERRLA Neck: Supple without JVD Heart: Reg rate and rhythm. No murmurs rubs or gallops Chest: CTA bilaterally without wheezes, rales, or rhonchi; no distress.  Abdomen: Soft, minimally tender, non-distended, bowel sounds positive. Multiple laparoscopic incisions with glue, c/d/i Extremities: No clubbing, cyanosis, or edema.   Psych: Pt's affect is appropriate albeit somewhat angry/upset. Pt is cooperative.   Skin: Clean and intact without signs of breakdown, healed incision/scar to posterior neck, dressings to R scalp as above, multiple abd incisions as above MsK: small bruise to R wrist over ulnar styloid - resolved  PRIOR EXAM: Neuro: Alert and oriented x 4, apparent cognitive slowing in response - ongoing  + Speech apraxia and dysarthria - slightly worse than yesterday Strength antigravity and against resistance in bilateral upper extremities and lower extremities, no apparent changes from last exam Sensation intact light touch in all 4 extremities + FTN extremely altered R>L today - Severe on R, moderate on L this AM  Musculoskeletal: Normal bulk and tone, no joint swelling noted Healed right TKA incision   Assessment/Plan: 1. Functional deficits which require 3+ hours per day of interdisciplinary therapy in a comprehensive inpatient rehab setting. Physiatrist is providing close team supervision and 24 hour management of active medical problems listed below. Physiatrist and rehab team continue to assess barriers to  discharge/monitor patient progress toward functional and medical goals  Care Tool:  Bathing    Body parts bathed by patient: Right arm, Left arm, Chest, Front perineal area, Abdomen, Buttocks, Left upper leg, Left lower leg, Face, Right lower leg, Right upper leg         Bathing assist Assist Level: Minimal Assistance - Patient > 75%     Upper Body Dressing/Undressing Upper body dressing   What is the patient wearing?: Pull over shirt    Upper body assist Assist Level: Contact Guard/Touching assist    Lower Body Dressing/Undressing Lower body dressing      What is the patient wearing?: Underwear/pull up, Pants     Lower body assist Assist for lower body dressing: Minimal Assistance - Patient > 75%     Toileting Toileting    Toileting assist Assist for toileting: Contact Guard/Touching assist     Transfers Chair/bed transfer  Transfers assist     Chair/bed transfer assist level: Minimal Assistance - Patient > 75%     Locomotion Ambulation   Ambulation assist      Assist level: Minimal Assistance - Patient > 75% Assistive device: Walker-rolling Max distance: 150   Walk 10 feet activity   Assist     Assist level: Minimal Assistance - Patient > 75% Assistive device: Walker-rolling   Walk 50 feet activity   Assist    Assist level: Minimal Assistance -  Patient > 75% Assistive device: Walker-rolling    Walk 150 feet activity   Assist    Assist level: Minimal Assistance - Patient > 75% Assistive device: Walker-rolling    Walk 10 feet on uneven surface  activity   Assist     Assist level: Minimal Assistance - Patient > 75% Assistive device: Walker-rolling   Wheelchair     Assist Is the patient using a wheelchair?: Yes Type of Wheelchair: Manual    Wheelchair assist level: Minimal Assistance - Patient > 75% Max wheelchair distance: 150'    Wheelchair 50 feet with 2 turns activity    Assist        Assist Level:  Minimal Assistance - Patient > 75%   Wheelchair 150 feet activity     Assist      Assist Level: Minimal Assistance - Patient > 75%   Blood pressure (!) 124/94, pulse 98, temperature 98 F (36.7 C), resp. rate 18, height 6\' 1"  (1.854 m), weight (S) 88.1 kg, SpO2 96 %.   Medical Problem List and Plan: 1. Functional deficits secondary to cerebellar CVA, c/b recent pseudomeningocele repair and Hx AVM resection December 2023..     - 4/4: Note Dr. Alanson Puls stating patient likely did, in fact, have a cerebellar stroke and not just post-operative changes as previously noted. Changing primary Dx to cerebellar infarct.           -patient may shower             -ELOS/Goals: 7-10 days, Mod I PT/OT/SLP; DC goal 4/16             -Admit to CIR -Decadron as indicated 4 MG Q8 HOURS with TAPER STARTING 3/29 to 4/10 - 4/3: Worsening hydrocephalus on CT, pending VP shunt placment 4/5 and return to IPR; see below  2.  Antithrombotics: -DVT/anticoagulation:  Pharmaceutical: Lovenox 40mg  QD -antiplatelet therapy: Aspirin 81 mg daily- currently held 08/09/22 d/t surgery, spoke with Dr. Conchita Paris, states it should restart in 1-2 days; verify that this occurs at anticipated timeframe  3. Pain Management: Tylenol as needed, Diazepam 5mg  q8h PRN muscle spasms, Voltaren 1% 2g QID - 3/29: Schedule Tylenol 1000 mg TID, add gabapentin 300 mg QHS for headache and sleep -08/02/22 pt previously on Hydrocodone-APAP 5-325mg  at prior discharge, was still using this, would like to keep; reordered for now, 5-325mg  q6h PRN x7 days, if needed longer then weekday team to discuss; adjusted tylenol to 650mg  TID to avoid overdosing of tylenol; would also prefer gabapentin be scheduled BID rather than PRN -08/03/22 - 4/1 improved control, cont regimen for now -08/09/22 Hydrocodone 5-325mg  q6h PRN readded since he had surgery again yesterday; also added morphine 4mg  q4h PRN in case he needs IV pain control postoperatively; monitor  closely  4. Mood/Behavior/Sleep: Zoloft 50 mg daily             -antipsychotic agents: N/A   -3/29: Gabapentin as above, melatonin 5mg  QHS PRN  -08/02/22 scheduled Gabapentin BID and melatonin QHS per request  -08/03/22 greatly improved sleep - continue  -4/2: Added trazodone 50 mg PRN for ?insomnia; not endorsed by patient.  -4/3: Patient endorses ongoing insomnia, uncertain etiology.  As needed trazodone not utilized, will schedule starting tonight. - sleep stable -08/09/22 slept poorly but suspect could be from missing some meds yesterday and postop recovery; monitor for now  5. Neuropsych/cognition: This patient is capable of making decisions on his own behalf. 6. Skin/Wound Care: Routine skin checks 7. Dysphagia/Fluids/Electrolytes/Nutrition: Routine in  and outs with follow-up chemistries  - 3/29: Cr stable, BUN increased, encourage PO fluids and re-test Monday--ordered weekly; stable CR, BUN improved  4/1; stable 4/3 -08/02/22 takes Vitamin D 2000IU QD and Calcium supplement at home; reordered  8.  Hyperlipidemia.  Crestor 20mg  daily 9.  Rheumatoid arthritis.  Consider resuming home regimen of rituximab, methotrexate and sulfasalazine after steroids completed (4/10).  Currently on Dexamethasone with taper. -08/02/22 wife concerned with restarting of meds; advised to readdress this once steroids have tapered around 08/13/22   10.  CAD.  Continue low-dose aspirin. Denies CP 11.  Dysphagia. Regular diet with nectar liquids. Follow up SLP 12.  Chronic systolic congestive heart failure.  Monitor for any signs of fluid overload. Daily weight. Currently HOLDING Spironolactone 12.5mg  QD (halved during last hospital stay), Lisinopril 10mg  QD, Metoprolol 25mg  QD  -3/30-4/5 wt stable, BP stable, continue to monitor -08/09/22 wt stable, BPs soft overnight but somewhat close to prior, improved this morning; monitor closely tonight, may need gentle hydration, hold off on labs for now Filed Weights    08/08/22 0500 08/08/22 1459 08/09/22 0500  Weight: 89.5 kg 90 kg (S) 88.1 kg   Vitals:   08/08/22 1404 08/08/22 1459 08/08/22 1710 08/08/22 1725  BP: 129/89 122/82 (!) 140/78 120/79   08/08/22 1740 08/08/22 1754 08/08/22 1951 08/09/22 0030  BP: 125/75 127/76 105/68 (!) 85/57   08/09/22 0035 08/09/22 0506 08/09/22 0522 08/09/22 1216  BP: 90/70 (!) 85/61 (!) 87/58 (!) 124/94     13.  OSA. Intolerance to CPAP. Counseling on CPAP benefits.  14. Prediabetes. A1C 6.0. Monitor on CBC/CMP 15. Leukocytosis. Likely due to steroids. Stable. Monitor for s/s infection.    - 10.2->11->10.6 -> 12 4/3 ; monitor  16. Macular degeneration: takes Preservision AREDS2 and Systane 0.6% eye drops at home -08/02/22 ordered hospital formulary alternatives- Lacrilube BID and Prosight multivitamin QD - 4/1-4/2: Wife concerned regarding use of alternative to systane; recommended bringing in from home  - 4/4: Pharmacy to assist in nonformulary order for home systane; ordered  17. R wrist bruising - stable, improving -08/02/22 wife concerned with bruises to R wrist, appear older/healing, no tenderness or crepitus/deformities, no swelling. Suspect minor pressure related bruise, doubt underlying bony injury, doubt need for xray; likely related to blood thinners; monitor for now  - 4/2: Mild HgB drop 10.6->9.8-> 10.1 4/3  18.  Hydrocephalus s/p AVM resection and pseudomeningocele repair  - 4/3: Cognitive slowing, increased dysarthria and ataxia.  See above, CT head and labs today for workup.  No focal weakness or sensory changes, no gross cranial nerve deficits to indicate need for acute stroke workup.  Last known normal would have been at bedtime last night. - 4/4: CT head concerning for worsening hydrocephalus. See above.  - 4/5:  Dr. Alanson PulsNundakumar and Dr. Jake Samplesawley recommending VP shunt, family agreeable, plan to OR 4/5 and return to IPR if no complications -08/09/22 VP shunt placed last night, doing well, some mild abd  discomfort but nondistended and no exquisite tenderness; pain control as above  I spent >5850mins on coordination of care, discussions with nursing regarding VS, meds, and discussion with pt regarding meds, and consulting Dr. Conchita ParisNundkumar regarding ASA dosing schedule.   LOS: 9 days A FACE TO FACE EVALUATION WAS PERFORMED  [address]Shyane Fossum 08/09/2022, 12:17 PM

## 2022-08-09 NOTE — Progress Notes (Signed)
Patient asymptomatic at this time. Resting in bed. On call provider contacted to update about patient's current vitals and systolic number for last two set of blood pressures less than 100.

## 2022-08-09 NOTE — Progress Notes (Signed)
  NEUROSURGERY PROGRESS NOTE   No issues overnight. Pt reports HA/abd soreness after surgery  EXAM:  BP (!) 87/58 (BP Location: Left Arm)   Pulse 93   Temp 97.7 F (36.5 C)   Resp 18   Ht 6\' 1"  (1.854 m)   Wt (S) 88.1 kg   SpO2 93%   BMI 25.62 kg/m   Awake, alert, oriented  Speech fluent, appropriate  Mild right facial droop, lower cranial neuropathy Unchanged R>L dysmetria/ataxia  IMPRESSION:  62 y.o. male POD#1 right VP shunt  PLAN: - Cont PT/OT - Depending on progress, may consider decreasing Strata II valve setting in a few days   Lisbeth Renshaw, MD [facility]

## 2022-08-10 ENCOUNTER — Encounter (HOSPITAL_COMMUNITY): Payer: Self-pay | Admitting: Neurosurgery

## 2022-08-10 DIAGNOSIS — R1310 Dysphagia, unspecified: Secondary | ICD-10-CM

## 2022-08-10 NOTE — Progress Notes (Signed)
  NEUROSURGERY PROGRESS NOTE   No issues overnight. Pt reports HA/abd soreness, morphine helped with pain yesterday.  EXAM:  BP 111/76 (BP Location: Right Arm)   Pulse 78   Temp 98.1 F (36.7 C) (Oral)   Resp 16   Ht 6\' 1"  (1.854 m)   Wt 87.4 kg   SpO2 91%   BMI 25.42 kg/m   Awake, alert, oriented  Speech fluent, appropriate  Mild right facial droop, lower cranial neuropathy Unchanged R>L dysmetria/ataxia Shunt wounds c/d/i Cervical wound c/d/I, unchanged minimal bogginess c/w underlying pseudomeningocele  IMPRESSION:  62 y.o. male POD#2 right VP shunt, neurologically stable  PLAN: - Cont PT/OT - Depending on progress, may consider decreasing Strata II valve setting tomorrow to 1.0 or 0.5   Lisbeth Renshaw, MD [facility]

## 2022-08-10 NOTE — Progress Notes (Signed)
Speech Language Pathology Daily Session Note  Patient Details  Name: Christopher Yu MRN: [id number] Date of Birth: [birth year]  Today's Date: 08/10/2022 SLP Individual Time: 1300-1315 SLP Individual Time Calculation (min): 15 min  Short Term Goals: Week 2: SLP Short Term Goal 1 (Week 2): Patient will self-monitor and correct wet vocal quality with mod verbal cues. SLP Short Term Goal 2 (Week 2): Patient will self-monitor and correct communication breakdowns with Mod verbal cues. SLP Short Term Goal 3 (Week 2): Patient will utilize diaphragmtic breathing at the short phrase level to achieve ~80% intelligibility with Mod verbal and visual cues. SLP Short Term Goal 4 (Week 2): Patient will demonstrate sustained attention to functional tasks for 30 minutes with min verbal cues for redirection. SLP Short Term Goal 5 (Week 2): Patient will consume curent diet with Min verbal cues for use of swallowing compensatory strategies.  Skilled Therapeutic Interventions: Pt was seen for unscheduled skilled ST services with focus on dysphagia at the request of PA.   Upon SLP entry, patient was sitting up in bed with wife at bedside.  Due to significant dysarthria, wife facilitated conversation regarding diet concerns.  Wife reports patient is not eating because he does not like the current consistency he is on.  Patient refused Dys2 trial when SLP was at bedside.  Patient was receptive to trialing Dys 3 consistency this date.  SLP observed patient with D3 solid x3 bites.  Patient was able to tolerate consistency with no overt difficulty - No oral residue or  s/sx of aspiration were noted.  SLP was comfortable with upgrading patient from D2 to D3 diet at this time with full supervision.  SLP suspects this will also increase intake PO.  Wife encouraged to look at menu with pt and choose items he enjoys.  SLP deferred decision of [name] to primary therapist as patient asked about  consumption of plain water.   Patient was left in room, all immediate needs within reach and wife at bedside.  Continue with current ST POC.   Pain Pain Assessment Faces Pain Scale: No hurt  Therapy/Group: Individual Therapy  [name] 08/10/2022, 3:01 PM

## 2022-08-10 NOTE — Progress Notes (Signed)
PROGRESS NOTE   Subjective/Complaints:  Pt doing better today. Slept "a little". Having some pain right now but will ask for vicodin. Got morphine yesterday and that helped with his postop pain-- knows to try PO pain control meds first but has the IV pain meds for now as a back up. Says overall, postop pain improving.   LBM was this morning, urinating fine today. Denies any other complaints or concerns today.   ROS: +HAs/postop head pain-improving, +insomnia-fluctuating, +bruises R wrist- stable, +postop abd pain-improving. Denies fevers, chills, N/V, constipation, diarrhea, SOB, cough, chest pain, new weakness or paraesthesias.    Objective:   CT HEAD WO CONTRAST ( )  Addendum Date: 08/08/2022   ADDENDUM REPORT: 08/08/2022 15:45 ADDENDUM: Impression #2 called by telephone at the time of interpretation on 08/08/2022 at 3:45 pm to provider [facility] , who verbally acknowledged these results. Electronically Signed   By: Jackey Loge D.O.   On: 08/08/2022 15:45   Result Date: 08/08/2022 CLINICAL DATA:  Provided history: Hydrocephalus. Stereotactic protocol for intraoperative navigation. EXAM: CT HEAD WITHOUT CONTRAST TECHNIQUE: Contiguous axial images were obtained from the base of the skull through the vertex without intravenous contrast. RADIATION DOSE REDUCTION: This exam was performed according to the departmental dose-optimization program which includes automated exposure control, adjustment of the mA and/or kV according to patient size and/or use of iterative reconstruction technique. COMPARISON:  Prior head CT examinations 08/06/2022 and earlier. FINDINGS: Brain: Mild generalized cerebral atrophy. 3.4 x 2.7 x 3.0 cm cystic lesion in the right cerebellar hemisphere, not significantly changed in size from the head CT performed yesterday. As before, there is mass effect upon the fourth ventricle without evidence of acute hydrocephalus.  There is a 3 mm focus of hyperdensity along the inferior aspect of this cystic lesion, which was likely present on yesterday's examination but partially obscured by streak/beam hardening artifact on this prior study (see series 3, image 8 on today's examination, as well as series 5, image 57). This finding was not definitively present on the prior head CT of 07/29/2022. This may reflect a tiny focus of acute/subacute hemorrhage or dystrophic calcification. Grossly unchanged fluid collection(s) within the left aspect of the posterior fossa and within the dorsal soft tissues underlying the suboccipital craniectomy at the imaged levels. No demarcated cortical infarct. No midline shift. Vascular: No hyperdense vessel.  Atherosclerotic calcifications. Skull: Prior suboccipital craniectomy. Sinuses/Orbits: No mass or acute finding within the imaged orbits. Mucous retention cysts within the left frontal sinus measuring up to 10 mm. Minimal mucosal thickening and small mucous retention cysts within bilateral ethmoid air cells. Small fluid level within the right sphenoid sinus. IMPRESSION: 1. 3.4 x 2.7 x 3.0 cm cystic lesion within the right cerebellar hemisphere, unchanged in size from yesterday's head CT. As before, there is mass effect upon the fourth ventricle without evidence of acute obstructive hydrocephalus. 2. 3 mm hyperdense focus along the inferior aspect of the cystic lesion, as described and which may reflect a tiny focus of acute/subacute hemorrhage or dystrophic calcification. 3. Fluid collection(s) within the left aspect of the posterior fossa, and within the dorsal soft tissues underlying the suboccipital craniectomy, grossly unchanged at the imaged  levels. 4. Mild generalized cerebral atrophy. 5. Paranasal sinus disease, as described. Electronically Signed: By: Jackey LogeKyle  Golden D.O. On: 08/08/2022 15:28   No results for input(s): "WBC", "HGB", "HCT", "PLT" in the last 72 hours. No results for input(s): "NA",  "K", "CL", "CO2", "GLUCOSE", "BUN", "CREATININE", "CALCIUM" in the last 72 hours.  Intake/Output Summary (Last 24 hours) at 08/10/2022 1121 Last data filed at 08/10/2022 0737 Gross per 24 hour  Intake 828 ml  Output 1350 ml  Net -522 ml        Physical Exam: Vital Signs Blood pressure 111/76, pulse 78, temperature 98.1 F (36.7 C), temperature source Oral, resp. rate 16, height 6\' 1"  (1.854 m), weight 87.4 kg, SpO2 91 %.    General: NAD, sitting up in bed, much more relaxed HEENT:  R scalp operative dressings in place, c/d/i. PERRLA Neck: Supple without JVD Heart: Reg rate and rhythm. No murmurs rubs or gallops Chest: CTA bilaterally without wheezes, rales, or rhonchi; no distress.  Abdomen: Soft, minimally tender, less than yesterday, non-distended, bowel sounds positive. Multiple laparoscopic incisions with glue, c/d/i Extremities: No clubbing, cyanosis, or edema.   Psych: Pt's affect is appropriate albeit somewhat angry/upset. Pt is cooperative.   Skin: Clean and intact without signs of breakdown, healed incision/scar to posterior neck, dressings to R scalp as above, multiple abd incisions as above MsK: small bruise to R wrist over ulnar styloid - resolved  PRIOR EXAM: Neuro: Alert and oriented x 4, apparent cognitive slowing in response - ongoing  + Speech apraxia and dysarthria - slightly worse than yesterday Strength antigravity and against resistance in bilateral upper extremities and lower extremities, no apparent changes from last exam Sensation intact light touch in all 4 extremities + FTN extremely altered R>L today - Severe on R, moderate on L this AM  Musculoskeletal: Normal bulk and tone, no joint swelling noted Healed right TKA incision   Assessment/Plan: 1. Functional deficits which require 3+ hours per day of interdisciplinary therapy in a comprehensive inpatient rehab setting. Physiatrist is providing close team supervision and 24 hour management of active  medical problems listed below. Physiatrist and rehab team continue to assess barriers to discharge/monitor patient progress toward functional and medical goals  Care Tool:  Bathing    Body parts bathed by patient: Right arm, Left arm, Chest, Front perineal area, Abdomen, Buttocks, Left upper leg, Left lower leg, Face, Right lower leg, Right upper leg         Bathing assist Assist Level: Minimal Assistance - Patient > 75%     Upper Body Dressing/Undressing Upper body dressing   What is the patient wearing?: Pull over shirt    Upper body assist Assist Level: Contact Guard/Touching assist    Lower Body Dressing/Undressing Lower body dressing      What is the patient wearing?: Underwear/pull up, Pants     Lower body assist Assist for lower body dressing: Minimal Assistance - Patient > 75%     Toileting Toileting    Toileting assist Assist for toileting: Contact Guard/Touching assist     Transfers Chair/bed transfer  Transfers assist     Chair/bed transfer assist level: Minimal Assistance - Patient > 75%     Locomotion Ambulation   Ambulation assist      Assist level: Minimal Assistance - Patient > 75% Assistive device: Walker-rolling Max distance: 150   Walk 10 feet activity   Assist     Assist level: Minimal Assistance - Patient > 75% Assistive device: Walker-rolling  Walk 50 feet activity   Assist    Assist level: Minimal Assistance - Patient > 75% Assistive device: Walker-rolling    Walk 150 feet activity   Assist    Assist level: Minimal Assistance - Patient > 75% Assistive device: Walker-rolling    Walk 10 feet on uneven surface  activity   Assist     Assist level: Minimal Assistance - Patient > 75% Assistive device: Walker-rolling   Wheelchair     Assist Is the patient using a wheelchair?: Yes Type of Wheelchair: Manual    Wheelchair assist level: Minimal Assistance - Patient > 75% Max wheelchair distance:  150'    Wheelchair 50 feet with 2 turns activity    Assist        Assist Level: Minimal Assistance - Patient > 75%   Wheelchair 150 feet activity     Assist      Assist Level: Minimal Assistance - Patient > 75%   Blood pressure 111/76, pulse 78, temperature 98.1 F (36.7 C), temperature source Oral, resp. rate 16, height  (1.854 m), weight 87.4 kg, SpO2 91 %.   Medical Problem List and Plan: 1. Functional deficits secondary to cerebellar CVA, c/b recent pseudomeningocele repair and Hx AVM resection December 2023..     - 4/4: Note Dr. Alanson Puls stating patient likely did, in fact, have a cerebellar stroke and not just post-operative changes as previously noted. Changing primary Dx to cerebellar infarct.           -patient may shower             -ELOS/Goals: 7-10 days, Mod I PT/OT/SLP; DC goal 4/16             -Admit to CIR -Decadron as indicated 4 MG Q8 HOURS with TAPER STARTING 3/29 to 4/10 - 4/3: Worsening hydrocephalus on CT, pending VP shunt placment 4/5 and return to IPR; see below  2.  Antithrombotics: -DVT/anticoagulation:  Pharmaceutical: Lovenox  QD -antiplatelet therapy: Aspirin 81 mg daily- currently held 08/09/22 d/t surgery, spoke with Dr. Conchita Paris 08/09/22, states it should restart in 1-2 days; verify that this occurs at anticipated timeframe  3. Pain Management: Tylenol as needed, Diazepam  q8h PRN muscle spasms, Voltaren 1% 2g QID - 3/29: Schedule Tylenol 1000 mg TID, add gabapentin 300 mg QHS for headache and sleep -08/02/22 pt previously on Hydrocodone-APAP 5-325mg  at prior discharge, was still using this, would like to keep; reordered for now, 5-325mg  q6h PRN x7 days, if needed longer then weekday team to discuss; adjusted tylenol to  TID to avoid overdosing of tylenol; would also prefer gabapentin be scheduled BID rather than PRN -08/03/22 - 4/1 improved control, cont regimen for now -08/09/22 Hydrocodone 5-325mg  q6h PRN readded since he had  surgery again yesterday; also added morphine  q4h PRN in case he needs IV pain control postoperatively; monitor closely -08/10/22 pain control improving, monitor  4. Mood/Behavior/Sleep: Zoloft 50 mg daily             -antipsychotic agents: N/A   -3/29: Gabapentin as above, melatonin  QHS PRN  -08/02/22 scheduled Gabapentin BID and melatonin QHS per request  -08/03/22 greatly improved sleep - continue  -4/2: Added trazodone 50 mg PRN for ?insomnia; not endorsed by patient.  -4/3: Patient endorses ongoing insomnia, uncertain etiology.  As needed trazodone not utilized, will schedule starting tonight. - sleep stable -08/09/22 slept poorly but suspect could be from missing some meds yesterday and postop recovery; monitor for now -08/10/22 sleep  gradually improving, monitor  5. Neuropsych/cognition: This patient is capable of making decisions on his own behalf. 6. Skin/Wound Care: Routine skin checks 7. Dysphagia/Fluids/Electrolytes/Nutrition: Routine in and outs with follow-up chemistries  - 3/29: Cr stable, BUN increased, encourage PO fluids and re-test Monday--ordered weekly; stable CR, BUN improved  4/1; stable 4/3 -08/02/22 takes Vitamin D 2000IU QD and Calcium supplement at home; reordered -08/10/22 pt dislikes diet order/purees and refuses to eat it, would prefer to go back to prior diet (bacon, eggs, mashed potatoes)-- not on SLP schedule today but spoke with speech therapy and asked if they could possibly do a quick eval to see if he can advance his diet today-- they will try to get to him today  8.  Hyperlipidemia.  Crestor 20mg  daily 9.  Rheumatoid arthritis.  Consider resuming home regimen of rituximab, methotrexate and sulfasalazine after steroids completed (4/10).  Currently on Dexamethasone with taper. -08/02/22 wife concerned with restarting of meds; advised to readdress this once steroids have tapered around 08/13/22   10.  CAD.  Continue low-dose aspirin. Denies CP 11.  Dysphagia.  Regular diet with nectar liquids. Follow up SLP 12.  Chronic systolic congestive heart failure.  Monitor for any signs of fluid overload. Daily weight. Currently HOLDING Spironolactone 12.5mg  QD (halved during last hospital stay), Lisinopril 10mg  QD, Metoprolol 25mg  QD  -3/30-4/5 wt stable, BP stable, continue to monitor -08/09/22 wt stable, BPs soft overnight but somewhat close to prior, improved this morning; monitor closely tonight, may need gentle hydration, hold off on labs for now -08/10/22 BPs improving, monitor Filed Weights   08/08/22 1459 08/09/22 0500 08/10/22 0500  Weight: 90 kg (S) 88.1 kg 87.4 kg   Vitals:   08/08/22 1725 08/08/22 1740 08/08/22 1754 08/08/22 1951  BP: 120/79 125/75 127/76 105/68   08/09/22 0030 08/09/22 0035 08/09/22 0506 08/09/22 0522  BP: (!) 85/57 90/70 (!) 85/61 (!) 87/58   08/09/22 1216 08/09/22 1242 08/09/22 1932 08/10/22 0446  BP: (!) 124/94 120/83 114/84 111/76     13.  OSA. Intolerance to CPAP. Counseling on CPAP benefits.  14. Prediabetes. A1C 6.0. Monitor on CBC/CMP 15. Leukocytosis. Likely due to steroids. Stable. Monitor for s/s infection.    - 10.2->11->10.6 -> 12 4/3 ; monitor  16. Macular degeneration: takes Preservision AREDS2 and Systane 0.6% eye drops at home -08/02/22 ordered hospital formulary alternatives- Lacrilube BID and Prosight multivitamin QD - 4/1-4/2: Wife concerned regarding use of alternative to systane; recommended bringing in from home  - 4/4: Pharmacy to assist in nonformulary order for home systane; ordered  17. R wrist bruising - stable, improving -08/02/22 wife concerned with bruises to R wrist, appear older/healing, no tenderness or crepitus/deformities, no swelling. Suspect minor pressure related bruise, doubt underlying bony injury, doubt need for xray; likely related to blood thinners; monitor for now  - 4/2: Mild HgB drop 10.6->9.8-> 10.1 4/3  18.  Hydrocephalus s/p AVM resection and pseudomeningocele repair  - 4/3:  Cognitive slowing, increased dysarthria and ataxia.  See above, CT head and labs today for workup.  No focal weakness or sensory changes, no gross cranial nerve deficits to indicate need for acute stroke workup.  Last known normal would have been at bedtime last night. - 4/4: CT head concerning for worsening hydrocephalus. See above.  - 4/5:  Dr. Alanson Puls and Dr. Jake Samples recommending VP shunt, family agreeable, plan to OR 4/5 and return to IPR if no complications -08/09/22 VP shunt placed last night, doing well, some mild abd  discomfort but nondistended and no exquisite tenderness; pain control as above    LOS: 10 days A FACE TO FACE EVALUATION WAS PERFORMED  [id number] Garet Hooton 08/10/2022, 11:21 AM

## 2022-08-11 LAB — CBC
HCT: 31 % — ABNORMAL LOW (ref 39.0–52.0)
Hemoglobin: 9.9 g/dL — ABNORMAL LOW (ref 13.0–17.0)
MCH: 23.4 pg — ABNORMAL LOW (ref 26.0–34.0)
MCHC: 31.9 g/dL (ref 30.0–36.0)
MCV: 73.3 fL — ABNORMAL LOW (ref 80.0–100.0)
Platelets: 262 10*3/uL (ref 150–400)
RBC: 4.23 MIL/uL (ref 4.22–5.81)
RDW: 20.1 % — ABNORMAL HIGH (ref 11.5–15.5)
WBC: 12.9 10*3/uL — ABNORMAL HIGH (ref 4.0–10.5)
nRBC: 0 % (ref 0.0–0.2)

## 2022-08-11 LAB — BASIC METABOLIC PANEL
Anion gap: 9 (ref 5–15)
BUN: 15 mg/dL (ref 8–23)
CO2: 30 mmol/L (ref 22–32)
Calcium: 8.9 mg/dL (ref 8.9–10.3)
Chloride: 101 mmol/L (ref 98–111)
Creatinine, Ser: 0.79 mg/dL (ref 0.61–1.24)
GFR, Estimated: >60 mL/min (ref >60)
Glucose, Bld: 97 mg/dL (ref 70–99)
Potassium: 4 mmol/L (ref 3.5–5.1)
Sodium: 140 mmol/L (ref 135–145)

## 2022-08-11 MED ORDER — GABAPENTIN 300 MG PO CAPS: 300.0000 mg | ORAL_CAPSULE | Freq: Three times a day (TID) | ORAL | Status: DC

## 2022-08-11 MED ORDER — GABAPENTIN 300 MG PO CAPS
300.0000 mg | ORAL_CAPSULE | Freq: Two times a day (BID) | ORAL | Status: DC
  Administered 2022-08-11 – 2022-08-14 (×6): 300 mg via ORAL
  Filled 2022-08-11 (×6): qty 1

## 2022-08-11 MED ORDER — GABAPENTIN 300 MG PO CAPS
600.0000 mg | ORAL_CAPSULE | Freq: Every day | ORAL | Status: DC
  Administered 2022-08-11 – 2022-08-13 (×3): 600 mg via ORAL
  Filled 2022-08-11 (×3): qty 2

## 2022-08-11 MED ORDER — ASPIRIN 81 MG PO TBEC
81.0000 mg | DELAYED_RELEASE_TABLET | Freq: Every day | ORAL | Status: DC
  Administered 2022-08-12 – 2022-08-26 (×15): 81 mg via ORAL
  Filled 2022-08-11 (×15): qty 1

## 2022-08-11 NOTE — Progress Notes (Signed)
Occupational Therapy Session Note  Patient Details  Name: Christopher Yu MRN: [id number] Date of Birth: 06-08-1960  Today's Date: 08/11/2022 OT Individual Time: 6948-5462 OT Individual Time Calculation (min): 60 min    Short Term Goals: Week 1:  OT Short Term Goal 1 (Week 1): Patient will maintain standing balance at the sink with CGA within functional BADL task OT Short Term Goal 1 - Progress (Week 1): Progressing toward goal OT Short Term Goal 2 (Week 1): Patient will complete LB dressing with min A OT Short Term Goal 2 - Progress (Week 1): Met OT Short Term Goal 3 (Week 1): Patient will tolerate standing for 3 minutes in preparation for BADL tasks OT Short Term Goal 3 - Progress (Week 1): Met  Skilled Therapeutic Interventions/Progress Updates:    Pt greeted semi-reclined in bed and requesting to shower today. Pt also with questions about Dysphagia 3 diet. Obtained a handout on Dysphagia 3 diet from SLP and gave to patient and spouse. OT obtained waterproof shower cap to keep scalp incision dry and covered up IV. Functional ambulation into bathroom with RW and min A> Improved overall balance from last therapy session Thursday. Pt sat on commode to void with min A to sit safely on commode. Shower completed with cues to use siap at ash cloth as pt just tried to use water. MinA overall for balance when washing buttocks. Dressing tasks completed from wc at the sink with overall min A and cues for safety. Standing balance/endurance with standing toothbrushing task. Min A for balance. Worked on bilateral coordination with placing toothpaste on toothbrush. Pt brought to therapy gym and addressed head turns, trunk rotation, and R nad L distal control using clothes pin task. Pt's balance improved since procedure Friday. Pt returned to room and left seated in wc with needs met and alarm on.   Therapy Documentation Precautions:  Precautions Precautions: Fall Restrictions Weight Bearing  Restrictions: No Other Position/Activity Restrictions: Ataxia, impulsive, impaired problem-solving General:   Vital Signs: Therapy Vitals Temp: 98 F (36.7 C) Pulse Rate: 89 Resp: 18 BP: 115/84 Patient Position (if appropriate): Lying Oxygen Therapy SpO2: 94 % O2 Device: Room Air Pain: Pain Assessment Pain Score: 4  Faces Pain Scale: No hurt ADL: ADL Eating: Set up, Supervision/safety Grooming: Minimal assistance Upper Body Bathing: Minimal assistance Lower Body Bathing: Minimal assistance Upper Body Dressing: Minimal assistance Lower Body Dressing: Moderate assistance Toileting: Minimal assistance Toilet Transfer: Minimal assistance Film/video editor: Minimal assistance ADL Comments: Has RW, has raised seat with handles over commode Vision   Perception    Praxis   Balance   Exercises:   Other Treatments:     Therapy/Group: Individual Therapy  Mal Amabile 08/11/2022, 2:57 PM

## 2022-08-11 NOTE — Progress Notes (Signed)
PROGRESS NOTE   Subjective/Complaints: Poor sleep, ongoing HA and abdominal pain limiting. Have been consistent since post-op period from shunt.  Neurosurgery to further adjust shunt today Goal DC morphine tomorrow and convert to POs only; discussed with patient, he is agreeable.   ROS: +HAs/postop head pain-improving, +insomnia-fluctuating, +bruises R wrist- stable, +postop abd pain-improving. Denies fevers, chills, N/V, constipation, diarrhea, SOB, cough, chest pain, new weakness or paraesthesias.    Objective:   No results found. Recent Labs    08/11/22 0639  WBC 12.9*  HGB 9.9*  HCT 31.0*  PLT 262   Recent Labs    08/11/22 0639  NA 140  K 4.0  CL 101  CO2 30  GLUCOSE 97  BUN 15  CREATININE 0.79  CALCIUM 8.9    Intake/Output Summary (Last 24 hours) at 08/11/2022 1256 Last data filed at 08/10/2022 2300 Gross per 24 hour  Intake 118 ml  Output 1000 ml  Net -882 ml         Physical Exam: Vital Signs Blood pressure 93/60, pulse 66, temperature 98.9 F (37.2 C), resp. rate 18, height 6\' 1"  (1.854 m), weight 88.5 kg, SpO2 93 %.    General: NAD, sitting up in bed, much more relaxed HEENT:  R scalp  - shunt in place.  PERRLA Neck: Supple without JVD Heart: Reg rate and rhythm. No murmurs rubs or gallops Chest: CTA bilaterally without wheezes, rales, or rhonchi; no distress.  Abdomen: Soft, minimally tender, less than yesterday, non-distended, bowel sounds positive. Multiple laparoscopic incisions with glue, c/d/i Extremities: No clubbing, cyanosis, or edema.   Psych: Pt's affect is appropriate albeit somewhat angry/upset. Pt is cooperative.   Skin: Clean and intact without signs of breakdown, healed incision/scar to posterior neck + staples over shunt site c/d/I + Abdominal trochar sites well approximated with dermabond, c/d/I  Neuro: Alert and oriented x 4   + Speech apraxia and dysarthria - dysarthria  severe Strength antigravity and against resistance in bilateral upper extremities and lower extremities, no apparent changes from last exam Sensation intact light touch in all 4 extremities + FTN extremely altered R>L today - Severe on R, mild on L   Musculoskeletal: Normal bulk and tone, no joint swelling noted    Assessment/Plan: 1. Functional deficits which require 3+ hours per day of interdisciplinary therapy in a comprehensive inpatient rehab setting. Physiatrist is providing close team supervision and 24 hour management of active medical problems listed below. Physiatrist and rehab team continue to assess barriers to discharge/monitor patient progress toward functional and medical goals  Care Tool:  Bathing    Body parts bathed by patient: Right arm, Left arm, Chest, Front perineal area, Abdomen, Buttocks, Left upper leg, Left lower leg, Face, Right lower leg, Right upper leg         Bathing assist Assist Level: Minimal Assistance - Patient > 75%     Upper Body Dressing/Undressing Upper body dressing   What is the patient wearing?: Pull over shirt    Upper body assist Assist Level: Contact Guard/Touching assist    Lower Body Dressing/Undressing Lower body dressing      What is the patient wearing?: Underwear/pull up, Pants  Lower body assist Assist for lower body dressing: Minimal Assistance - Patient > 75%     Toileting Toileting    Toileting assist Assist for toileting: Contact Guard/Touching assist     Transfers Chair/bed transfer  Transfers assist     Chair/bed transfer assist level: Minimal Assistance - Patient > 75%     Locomotion Ambulation   Ambulation assist      Assist level: Minimal Assistance - Patient > 75% Assistive device: Walker-rolling Max distance: 150   Walk 10 feet activity   Assist     Assist level: Minimal Assistance - Patient > 75% Assistive device: Walker-rolling   Walk 50 feet activity   Assist     Assist level: Minimal Assistance - Patient > 75% Assistive device: Walker-rolling    Walk 150 feet activity   Assist    Assist level: Minimal Assistance - Patient > 75% Assistive device: Walker-rolling    Walk 10 feet on uneven surface  activity   Assist     Assist level: Minimal Assistance - Patient > 75% Assistive device: Walker-rolling   Wheelchair     Assist Is the patient using a wheelchair?: Yes Type of Wheelchair: Manual    Wheelchair assist level: Minimal Assistance - Patient > 75% Max wheelchair distance: 150'    Wheelchair 50 feet with 2 turns activity    Assist        Assist Level: Minimal Assistance - Patient > 75%   Wheelchair 150 feet activity     Assist      Assist Level: Minimal Assistance - Patient > 75%   Blood pressure 93/60, pulse 66, temperature 98.9 F (37.2 C), resp. rate 18, height 6\' 1"  (1.854 m), weight 88.5 kg, SpO2 93 %.   Medical Problem List and Plan: 1. Functional deficits secondary to cerebellar CVA, c/b recent pseudomeningocele repair and Hx AVM resection December 2023..     - 4/4: Note Dr. Alanson Puls stating patient likely did, in fact, have a cerebellar stroke and not just post-operative changes as previously noted. Changing primary Dx to cerebellar infarct.           -patient may shower             -ELOS/Goals: 7-10 days, Mod I PT/OT/SLP; DC goal 4/16             -Admit to CIR -Decadron as indicated 4 MG Q8 HOURS with TAPER STARTING 3/29 to 4/10 - 4/3: Worsening hydrocephalus on CT ->VP shunt placment 4/5 -> pending adjustments this AM  2.  Antithrombotics: -DVT/anticoagulation:  Pharmaceutical: Lovenox 40mg  QD -antiplatelet therapy: Aspirin 81 mg daily- currently held 08/09/22 d/t surgery, spoke with Dr. Conchita Paris 08/09/22, states it should restart in 1-2 days - > resume 4/9  3. Pain Management: Tylenol as needed, Diazepam 5mg  q8h PRN muscle spasms, Voltaren 1% 2g QID - 3/29: Schedule Tylenol 1000 mg TID,  add gabapentin 300 mg QHS for headache and sleep -08/02/22 pt previously on Hydrocodone-APAP 5-325mg  at prior discharge, was still using this, would like to keep; reordered for now, 5-325mg  q6h PRN x7 days, if needed longer then weekday team to discuss; adjusted tylenol to 650mg  TID to avoid overdosing of tylenol; would also prefer gabapentin be scheduled BID rather than PRN -08/03/22 - 4/1 improved control, cont regimen for now -08/09/22 Hydrocodone 5-325mg  q6h PRN readded since he had surgery again yesterday; also added morphine 4mg  q4h PRN in case he needs IV pain control postoperatively; monitor closely -08/11/22 pain ongoing, monitor and convert  to PO regimen in AM. For HA, Increase gabapentin from 300 mg BID to 300/300/600  4. Mood/Behavior/Sleep: Zoloft 50 mg daily             -antipsychotic agents: N/A   -3/29: Gabapentin as above, melatonin 5mg  QHS PRN  -08/02/22 scheduled Gabapentin BID and melatonin QHS per request  -08/03/22 greatly improved sleep - continue  -4/2: Added trazodone 50 mg PRN for ?insomnia; not endorsed by patient.  -4/3: Patient endorses ongoing insomnia, uncertain etiology.  As needed trazodone not utilized, will schedule starting tonight. - sleep stable -08/09/22 slept poorly but suspect could be from missing some meds yesterday and postop recovery; monitor for now -08/10/22 sleep gradually improving, monitor - 4/8: remains poor d.t HA, pain; tx as above  5. Neuropsych/cognition: This patient is capable of making decisions on his own behalf. 6. Skin/Wound Care: Routine skin checks 7. Dysphagia/Fluids/Electrolytes/Nutrition: Routine in and outs with follow-up chemistries  - 3/29: Cr stable, BUN increased, encourage PO fluids and re-test Monday--ordered weekly; stable CR, BUN improved  4/1; stable 4/3; stable 4/8 -08/02/22 takes Vitamin D 2000IU QD and Calcium supplement at home; reordered -08/10/22 pt dislikes diet order/purees and refuses to eat it, would prefer to go back to  prior diet (bacon, eggs, mashed potatoes)-- not on SLP schedule today but spoke with speech therapy and asked if they could possibly do a quick eval to see if he can advance his diet today-- they will try to get to him today - 4.8: diet advanced to Dys 3; pt reports improved POS  8.  Hyperlipidemia.  Crestor 20mg  daily 9.  Rheumatoid arthritis.  Consider resuming home regimen of rituximab, methotrexate and sulfasalazine after steroids completed (4/10).  Currently on Dexamethasone with taper. -08/02/22 wife concerned with restarting of meds; advised to readdress this once steroids have tapered around 08/13/22   10.  CAD.  Continue low-dose aspirin. Denies CP 11.  Dysphagia. Regular diet with nectar liquids. Follow up SLP 12.  Chronic systolic congestive heart failure.  Monitor for any signs of fluid overload. Daily weight. Currently HOLDING Spironolactone 12.5mg  QD (halved during last hospital stay), Lisinopril 10mg  QD, Metoprolol 25mg  QD  -3/30-4/5 wt stable, BP stable, continue to monitor -08/09/22 wt stable, BPs soft overnight but somewhat close to prior, improved this morning; monitor closely tonight, may need gentle hydration, hold off on labs for now -4/7-8/24 BPs improving, monitor Filed Weights   08/09/22 0500 08/10/22 0500 08/11/22 0448  Weight: (S) 88.1 kg 87.4 kg 88.5 kg   Vitals:   08/08/22 1951 08/09/22 0030 08/09/22 0035 08/09/22 0506  BP: 105/68 (!) 85/57 90/70 (!) 85/61   08/09/22 0522 08/09/22 1216 08/09/22 1242 08/09/22 1932  BP: (!) 87/58 (!) 124/94 120/83 114/84   08/10/22 0446 08/10/22 1315 08/10/22 1944 08/11/22 0437  BP: 111/76 111/86 116/81 93/60     13.  OSA. Intolerance to CPAP. Counseling on CPAP benefits.  14. Prediabetes. A1C 6.0. Monitor on CBC/CMP 15. Leukocytosis. Likely due to steroids. Stable. Monitor for s/s infection.    - 10.2->11->10.6 -> 12 4/3 ; monitor   - 4/8: 12.9, post-op, monitor for s/s infection  16. Macular degeneration: takes Preservision  AREDS2 and Systane 0.6% eye drops at home -08/02/22 ordered hospital formulary alternatives- Lacrilube BID and Prosight multivitamin QD - 4/1-4/2: Wife concerned regarding use of alternative to systane; recommended bringing in from home  - 4/4: Pharmacy to assist in nonformulary order for home systane; ordered  17. R wrist bruising - stable, improving -  08/02/22 wife concerned with bruises to R wrist, appear older/healing, no tenderness or crepitus/deformities, no swelling. Suspect minor pressure related bruise, doubt underlying bony injury, doubt need for xray; likely related to blood thinners; monitor for now  - 4/2: Mild HgB drop 10.6->9.8-> 10.1 -> 9.9 post-op  18.  Hydrocephalus s/p AVM resection and pseudomeningocele repair  - 4/3: Cognitive slowing, increased dysarthria and ataxia.  See above, CT head and labs today for workup.  No focal weakness or sensory changes, no gross cranial nerve deficits to indicate need for acute stroke workup.  Last known normal would have been at bedtime last night. - 4/4: CT head concerning for worsening hydrocephalus. See above.  - 4/5:  Dr. Alanson Puls and Dr. Jake Samples recommending VP shunt, family agreeable, plan to OR 4/5 and return to IPR if no complications -08/09/22 VP shunt placed last night, doing well, some mild abd discomfort but nondistended and no exquisite tenderness; pain control as above - 4.8: pending neurosurgery eval, per note may consider decreasing Strata II valve setting tomorrow to 1.0 or 0.5     LOS: 11 days A FACE TO FACE EVALUATION WAS PERFORMED  Angelina Sheriff 08/11/2022, 12:56 PM

## 2022-08-11 NOTE — Progress Notes (Signed)
Speech Language Pathology Daily Session Note  Patient Details  Name: Christopher Yu MRN: [id number] Date of Birth: 04/12/1961  Today's Date: 08/11/2022 SLP Individual Time: 0725-0825 SLP Individual Time Calculation (min): 60 min  Short Term Goals: Week 2: SLP Short Term Goal 1 (Week 2): Patient will self-monitor and correct wet vocal quality with mod verbal cues. SLP Short Term Goal 2 (Week 2): Patient will self-monitor and correct communication breakdowns with Mod verbal cues. SLP Short Term Goal 3 (Week 2): Patient will utilize diaphragmtic breathing at the short phrase level to achieve ~80% intelligibility with Mod verbal and visual cues. SLP Short Term Goal 4 (Week 2): Patient will demonstrate sustained attention to functional tasks for 30 minutes with min verbal cues for redirection. SLP Short Term Goal 5 (Week 2): Patient will consume curent diet with Min verbal cues for use of swallowing compensatory strategies.  Skilled Therapeutic Interventions: Skilled treatment session focused on dysphagia and speech goals. Upon arrival, patient was awake in bed. SLP facilitated session by providing skilled observation with breakfast meal of Dys. 3 textures with nectar-thick liquids. Patient consumed eggs and cream of wheat with min verbal cues needed for use of small bites, efficient mastication, and multiple swallows. Patient with coughing X 2 throughout meal which has been consistent throughout meals. Patient did report increased "tightness" in swallowing which he suspects is due to multiple surgical sites. Recommend patient continue current diet with full supervision. SLP also facilitated session by providing Mod-Max A verbal cues for use of speech intelligibility strategies at the sentence level resulting in 60-70% intelligibility. Patient left upright in bed with alarm on and all needs within reach. Continue with current plan of care.      Pain Pain Assessment Pain Scale: 0-10 Pain  Score: 4  Faces Pain Scale: No hurt Pain Type: Acute pain;Surgical pain Pain Location: Head Pain Orientation: Upper  Therapy/Group: Individual Therapy  Naevia Unterreiner 08/11/2022, 11:44 AM

## 2022-08-11 NOTE — Progress Notes (Signed)
Physical Therapy Session Note  Patient Details  Name: Christopher Yu MRN: [id number] Date of Birth: 25-Nov-1960  Today's Date: 08/11/2022 PT Individual Time: 7829-5621 PT Individual Time Calculation (min): 69 min   Short Term Goals: Week 2:  PT Short Term Goal 1 (Week 2): Patient will perform bed/chair transfer with LRAD and CGA PT Short Term Goal 2 (Week 2): Patient will ambulate >39' with LRAD and CGA PT Short Term Goal 3 (Week 2): Patient will ascend/descend > a flight of stairs with R HR and CGA  Skilled Therapeutic Interventions/Progress Updates:    Pt presents in room, asleep in bed, easily roused and stating wants to continue sleeping, denies pain. This therapist encourages pt to participate with pt reporting wanting to continue sleeping, pt reoriented to 3 hour rule with pt becoming more agitated stating "I don't care. I'll do what I have to do" with pt then agreeable to session but with increased agitation. Therapist dependently dons socks and shoes for time management. Pt completes supine to sit with supervision, sit to stand EOB with mod assist for immediate standing balance due to COG anterior to BOS. Pt completes stand pivot transfer with mod assist to L.  Therapist initially attempts to encourage at beginning of session with pt continue to demonstrate increased agitation and stating "I don't care!" when asked for preferences. Pt with improved response with direct statements and verbal commands, increased agitation noted when engaged in conversation or when given choices.  Pt transported dependently in WC to day room for time management and secondary to agitation. Pt then ambulates without device mod assist with significant anterior lean decreased foot clearance ~30'.  Following seated rest break pt ambulates 179' with RW min/mod assist, improved foot clearance and decreased anterior lean noted however pt with LOB to R with pt demonstrating NBOS, requires increased assist  with turns to L with LOB to R.  Pt completes seated rest break then ambulates around 2 cones in figure 8 pattern with RW 2 trials with min/mod assist. Completed to promote improved postural stability with turns.   Pt then completes forward/backward ambulation with RW min/mod assist 3x10' to promote multidirectional stepping stability, pt continues to demonstrate COG to R of BOS with occasional LOB noted, corrected with mod assist verbal cues provided for   Pt then completes side stepping 2x10' to R and L with RW, completed to promote improved lateral stability for ambulation however pt demonstrates improved stability with side stepping compared to forward/backward stepping.  Pt completes step taps 2x20 reps to 4" step no UE support with pt demonstrating COG anterior to BOS, able to correct with verbal cueing however unable to maintain, completed to promote improved postural stability and foot clearance for gait. Requires mod assist for task.  Pt completes x10 sit<>stands without UE support in standing to promote improved immediate standing balance with pt demonstrate inconsistent standing balance however tendency for COG to be anterior to BOS requiring min/mod assist to prevent anterior LOB.  Pt ambulates final gait trial 179' with RW, mod assist demonstrating poor foot clearance with RLE and increased LOB to R and R lateral lean.   Pt transported to room dependently in [place] for time management, completes stand pivot transfer to R with CGA, pt grabbing for hospital bed rail once in standing. Pt doffs shoes in sitting with supervision and completes sit to supine with supervision. Pt remains supine with all needs within reach, call light in place, bed alarm activated. Pt reports headache at end  of session, therapist notifies RN.  Therapy Documentation Precautions:  Precautions Precautions: Fall Restrictions Weight Bearing Restrictions: No Other Position/Activity Restrictions: Ataxia, impulsive,  impaired problem-solving     Therapy/Group: Individual Therapy  Edwin Cap PT, DPT 08/11/2022, 12:17 PM

## 2022-08-12 MED ORDER — SERTRALINE HCL 50 MG PO TABS
75.0000 mg | ORAL_TABLET | Freq: Every morning | ORAL | Status: DC
  Administered 2022-08-13 – 2022-08-25 (×13): 75 mg via ORAL
  Filled 2022-08-12 (×13): qty 2

## 2022-08-12 MED ORDER — BUSPIRONE HCL 10 MG PO TABS: 5.0000 mg | ORAL_TABLET | Freq: Three times a day (TID) | ORAL | Status: DC

## 2022-08-12 NOTE — Patient Care Conference (Signed)
Inpatient RehabilitationTeam Conference and Plan of Care Update Date: 08/12/2022   Time: 10:57 AM    Patient Name: Christopher Yu      Medical Record Number: [id number]  Date of Birth: 08-09-1960 Sex: Male         Room/Bed: 2X11B/5M08Y-22 Payor Info: Payor: [employer] / Plan: [employer] COMM PPO / Product Type: *No Product type* /    Admit Date/Time:  07/31/2022  2:07 PM  Primary Diagnosis:  Pseudomeningocele  Hospital Problems: Principal Problem:   Pseudomeningocele Active Problems:   Adjustment disorder    Expected Discharge Date: Expected Discharge Date: 08/19/22  Team Members Present: Physician leading conference: Dr. Elijah Birk Social Worker Present: Cecile Sheerer, LCSWA Nurse Present: Vedia Pereyra, RN PT Present: Malachi Pro, PT OT Present: Kearney Hard, OT SLP Present: Feliberto Gottron, SLP PPS Coordinator present : Fae Pippin, SLP     Current Status/Progress Goal Weekly Team Focus  Bowel/Bladder    Continent B/B   Remain continent     Toilet every 4 hours and PRN    Swallow/Nutrition/ Hydration   Dys. 3 textures with nectar-thick liquids, Min-Mod A for use of swallowing compensatory strategies   Supervision  tolerance of current diet with use of strategies    ADL's   Min A BADL tasks, min/mod A for dynamic balance   Supervision, mod I   activity tolerance, balance, pt/family education, self-care retraining,    Mobility   Supv for bed mobility; MinA for sit/stands; Min/ModA for stand pivot transfers with RW and gait up to 175'; CGA/MinA for stair mobility with B HR- Patient continues to demonstrate poor midline posturing and balance post VP shunt placement   Supv with LRAD  Dynamic stability, midline posturing, endurance/activity tolerance, NMR, sit/stands, transfers, gait, stair mobility, discharge planning, family education, etc.    Communication   Mod-Max A   Min A   use of speech intelligibility strategies,  diaphragmatic breathing    Safety/Cognition/ Behavioral Observations  Min A   Supervision   selective attention and emergent awareness    Pain    10/10 headaches     Less than 4 with PRN medications  Assess every 4 hours and PRN    Skin     Incision to neck- no drainage. Incision to head- no drainage. Incision to ABD glue-no drainage.  Incisions will remain intact and free of infection/breakdown.    Assess every shift and PRN        Discharge Planning:  Pt will discharge to home with support from his wife, and PRN support from their children. SW will confirm there are no barriers to discharge.   Team Discussion: Pseudomeningocele. Continent B/B. PRN medications for headaches. Neck, head, and abdominal incisions healing. Neurontin increased. Steroid taper ending soon. Therapies report poor frustration tolerance with yelling at staff. Hypotension without symptoms. D3/nectar. Min/Mod A for swallowing compensatory strategies. MinA BADLs. Supv bed mobility. MinA for STS. Min/Mod A for stand pivot transfers with RW and gait up to 175'. CGA/MinA stair mobility with bilateral HR. Patient continues to demonstrate poor midline posturing and balance post VP shunt placement.  Patient on target to meet rehab goals: yes, progressing towards goals  *See Care Plan and progress notes for long and short-term goals.   Revisions to Treatment Plan:  Medications adjustments, monitor labs/VS  Teaching Needs: Medications, safety, gait/transfer training, skin care, self care, etc.   Current Barriers to Discharge: Decreased caregiver support, Home enviroment access/layout, Wound care, and Behavior  Possible  Resolutions to Barriers: Family education, nursing education, order recommended DME     Medical Summary Current Status: medically complicated by insomnia, hypotension, dysphjagia, headache, abdominal pain, and recent VP shunt placement  Barriers to Discharge: Hypotension;Medical stability;Pending  surgery/plan;Self-care education;Inadequate Nutritional Intake  Barriers to Discharge Comments: adjustments of VP shunt per neurosurgery, poor pain control post-op, poor POs Possible Resolutions to [employer] Focus: adjusting pain medications to PO regimen, encouraging fluids and oral intakes, monitorring neuro status   Continued Need for Acute Rehabilitation Level of Care: The patient requires daily medical management by a physician with specialized training in physical medicine and rehabilitation for the following reasons: Direction of a multidisciplinary physical rehabilitation program to maximize functional independence : Yes Medical management of patient stability for increased activity during participation in an intensive rehabilitation regime.: Yes Analysis of laboratory values and/or radiology reports with any subsequent need for medication adjustment and/or medical intervention. : Yes   I attest that I was present, lead the team conference, and concur with the assessment and plan of the team.   Jearld Adjutant 08/12/2022, 3:12 PM

## 2022-08-12 NOTE — Progress Notes (Signed)
Physical Therapy Session Note  Patient Details  Name: Christopher Yu MRN: [id number] Date of Birth: 10-Aug-1960  Today's Date: 08/12/2022 PT Individual Time: 1054-1130 PT Individual Time Calculation (min): 36 min   Short Term Goals: Week 2:  PT Short Term Goal 1 (Week 2): Patient will perform bed/chair transfer with LRAD and CGA PT Short Term Goal 2 (Week 2): Patient will ambulate >14' with LRAD and CGA PT Short Term Goal 3 (Week 2): Patient will ascend/descend > a flight of stairs with R HR and CGA  Skilled Therapeutic Interventions/Progress Updates:    Initial part of session focused a lot on therapeutic listening, goal setting, and education. Pt spent time talking through his perspective about how certain therapy sessions have gone, approaches, his lack of control over the situation in general, and his overall big picture goals (about home and eventually returning to work). Pt states he does not do well with the approach of just checking things off the list or what appears to be agenda driven sessions to him - he wants to know how it relates back to the bigger goal but understands that we know best how to progress him. Extra time needed for conversation due to impairments in speech intelligibility. Pt able to be more clear when he slowed down. Demonstrated and then pt return demonstrated w/c parts management in preparation for transfers. Min assist transfer with RW with steadying assist for balance due to impaired coordination and truncal ataxia. Hand off to end of session to OT in therapy gym.   Therapy Documentation Precautions:  Precautions Precautions: Fall Restrictions Weight Bearing Restrictions: No Other Position/Activity Restrictions: Ataxia, impulsive, impaired problem-solving Pain: Reports headache, but states it is ok right now.   Therapy/Group: Individual Therapy  Karolee Stamps Darrol Poke, PT, DPT, CBIS  08/12/2022, 12:11 PM

## 2022-08-12 NOTE — Progress Notes (Signed)
Physical Therapy Session Note  Patient Details  Name: Christopher Yu MRN: [id number] Date of Birth: [birth year]  Today's Date: 08/12/2022 PT Individual Time: 0900-1000 PT Individual Time Calculation (min): 60 min  and Today's Date: 08/12/2022 PT Missed Time: 15 Minutes Missed Time Reason: Patient unwilling to participate  Short Term Goals: Week 2:  PT Short Term Goal 1 (Week 2): Patient will perform bed/chair transfer with LRAD and CGA PT Short Term Goal 2 (Week 2): Patient will ambulate >21' with LRAD and CGA PT Short Term Goal 3 (Week 2): Patient will ascend/descend > a flight of stairs with R HR and CGA  Skilled Therapeutic Interventions/Progress Updates:      Therapy Documentation Precautions:  Precautions Precautions: Fall Restrictions Weight Bearing Restrictions: No Other Position/Activity Restrictions: Ataxia, impulsive, impaired problem-solving  Pt received semi-reclined in bed, agreeable to PT session. Pt declines pain in session and requires supervision with supine to sit with use of bed features. Pt requires supervision for lower body dressing ( underwear, pants, socks, and shoes) with increased time. PT encourages rest breaks throughout dressing as pt presents with activity tolerance deficits.   Pt CGA with sit to stand and min/mod A with gait ~180 ft to dayroom. Pt requires mod verbal cues for AD management as pt keeps walker far ahead of him and for safety. Pt encouraged to slow down especially with turns and navigation of doorways as pt with lateral loss of balance which required mod A for recovery.   Pt requests morning coffee and PT provided nectar thick beverage. Pt able to recall swallowing strategy of turning to the left with drinking.   Pt requires mod A for turning around cones 2 x 8 ft with RW.  Pt demonstrates increased difficulty with R foot clearance and requires verbal cues to self-correct. PT educated pt to slow down and to keep walker close to  him during mobility. Following activity pt able to recall cues provided during exercise with mod A.   Pt with increased agitation and stated he no longer wanted to participate in PT passed 10. Pt ambulated min/mod A with RW to room and left seated in w/c at bedside with all needs in reach and chair alarm on.   Therapy/Group: Individual Therapy  Truitt Leep Truitt Leep PT, DPT  08/12/2022, 7:59 AM

## 2022-08-12 NOTE — Progress Notes (Signed)
  NEUROSURGERY PROGRESS NOTE   No issues overnight. Pts wife reports he had good improvement in truncal stability with OT today. Otherwise no complaints.  EXAM:  BP 91/64 (BP Location: Left Arm)   Pulse 75   Temp 98 F (36.7 C)   Resp 18   Ht 6\' 1"  (1.854 m)   Wt 88 kg   SpO2 96%   BMI 25.60 kg/m   Awake, alert, oriented  Speech fluent, appropriate  CN grossly intact  5/5 BUE/BLE  Shunt valve pumps/refills briskly  IMPRESSION:  62 y.o. male POD# 3 s/p right VPS, stable  PLAN: - I have decreased the Strata valve to 1.0 at bedside - Cont PT/OT.   Lisbeth Renshaw, MD [facility]

## 2022-08-12 NOTE — Progress Notes (Signed)
Occupational Therapy Session Note  Patient Details  Name: Christopher Yu MRN: [id number] Date of Birth: 03-05-1961  Today's Date: 08/12/2022 OT Individual Time: 1130-1200 OT Individual Time Calculation (min): 30 min    Short Term Goals: Week 1:  OT Short Term Goal 1 (Week 1): Patient will maintain standing balance at the sink with CGA within functional BADL task OT Short Term Goal 1 - Progress (Week 1): Progressing toward goal OT Short Term Goal 2 (Week 1): Patient will complete LB dressing with min A OT Short Term Goal 2 - Progress (Week 1): Met OT Short Term Goal 3 (Week 1): Patient will tolerate standing for 3 minutes in preparation for BADL tasks OT Short Term Goal 3 - Progress (Week 1): Met Week 2:  OT Short Term Goal 1 (Week 2): Patient will maintain dynamic balance during BADL tasks with no more than min A OT Short Term Goal 2 (Week 2): Patient will reach for objects with R hand and bring to mouth with supervision OT Short Term Goal 3 (Week 2): Patient will tie shoes using B UEs  Skilled Therapeutic Interventions/Progress Updates:    1:1 Hand off from PT in the gym. Continued focus on truncal control with movement, functional reach outside of BOS, standing balance, control with movement with resistance.   First transitioned into tall kneeling on mat with focus on reaching outside of base of support over the [place] bench to retrieve cones without UE support and then return to upright position with control- requiring min A for support. Transitioned into completing similar task in standing with reaching for cones and then placing them behind him on the mat again with focus on controlled movement.    Performed stand to squat to stand 10x with control to not sit down inbetween.  Performed sideway walking with resistance of green theraband around knees - pt with some difficulty with too much anterior weight shift needing support to regain balance. Then transitioned into  performing task in a squatting position.   Pt continues to move fast and discussed feeling more comfortable with the RW.  Added 4 lbs to each side of the walk to create resistance to the movement to help with control with turns and with speed. Ambulated with contact guard-min A back to the room - without needing mod A today.  Left resting in the bed with bell alarm on.   Therapy Documentation Precautions:  Precautions Precautions: Fall Restrictions Weight Bearing Restrictions: No Other Position/Activity Restrictions: Ataxia, impulsive, impaired problem-solving  Pain: No c/o pain in session   Therapy/Group: Individual Therapy  Roney Mans [facility] 08/12/2022, 1:13 PM

## 2022-08-12 NOTE — Progress Notes (Signed)
Occupational Therapy Session Note  Patient Details  Name: Christopher Yu MRN: [id number] Date of Birth: February 23, 1961  Today's Date: 08/12/2022 OT Individual Time: 1350-1503 OT Individual Time Calculation (min): 73 min   Short Term Goals: Week 2:  OT Short Term Goal 1 (Week 2): Patient will maintain dynamic balance during BADL tasks with no more than min A OT Short Term Goal 2 (Week 2): Patient will reach for objects with R hand and bring to mouth with supervision OT Short Term Goal 3 (Week 2): Patient will tie shoes using B UEs  Skilled Therapeutic Interventions/Progress Updates:    Pt greeted sei-reclined in bed with spouse present. Discussed potential DME needs with focus on bathroom equipment. Spouse reported they have a shower seat without a back and stated it feels unsteady at times. We discussed using a tub transfer bench or BSC in shower for more support. Pt agreeable to shower today. Functional ambulation from bed to toilet with min A and use of weighted RW. Pt voided bladder and doffed clothing seated on toilet. Pt performed bathing tasks sit<>stand from tub bench with cues to try to apply appropriate pressure with wash cloth when washing. Pt with occasional LOB with dynamic sitting tasks requiring min A to correct. Dressing tasks from wc with sit<>stands without AD. Pt with one strong anterior LOB almost falling completely forward with no righting reaction requiring max A to maintain upright position. Pt brought to therapy gym in wc for time management  Addressed standing balance/endurance standing on foam mat while placing small pegs in peg board. Worked on [facility] of both R and L hand. More difficulty with standing and coordinated so we returned to sitting to focus on  B hand [facility] with small peg board task. Progressed to using tweezers to remove pegs. Pt ambulated 20 feet w/ RW to room with min A. Pt returned to bed and left semi-reclined in bed with bed alarm on, call bell in reach,  and needs met.   Therapy Documentation Precautions:  Precautions Precautions: Fall Restrictions Weight Bearing Restrictions: No Other Position/Activity Restrictions: Ataxia, impulsive, impaired problem-solving Pain:  Denies pain    Therapy/Group: Individual Therapy  Mal Amabile 08/12/2022, 2:49 PM

## 2022-08-12 NOTE — Progress Notes (Signed)
PROGRESS NOTE   Subjective/Complaints:  Improved per OT since shunt placement, Min-Mod A for PT and OT currently. Posture and balance remain challenging. D3 diet, with dysarthria. Appears lower frustration tolerance than what he had prior to shunt placement.  Patient agrees that he has been frustrated in the postop period, also endorses depression regarding his current disabilities.  Is agreeable to adjusting or increasing his antidepressants to help address this.  Dr. Conchita Paris adjusted strata valve to 1.0 yesterday.  ROS: +HAs/postop head pain-improving, +insomnia-improved, +bruises R wrist- stable, +postop abd pain-improving. Denies fevers, chills, N/V, constipation, diarrhea, SOB, cough, chest pain, new weakness or paraesthesias.    Objective:   No results found. Recent Labs    08/11/22 0639  WBC 12.9*  HGB 9.9*  HCT 31.0*  PLT 262    Recent Labs    08/11/22 0639  NA 140  K 4.0  CL 101  CO2 30  GLUCOSE 97  BUN 15  CREATININE 0.79  CALCIUM 8.9     Intake/Output Summary (Last 24 hours) at 08/12/2022 1056 Last data filed at 08/12/2022 0811 Gross per 24 hour  Intake 594 ml  Output 1500 ml  Net -906 ml         Physical Exam: Vital Signs Blood pressure 91/64, pulse 75, temperature 98 F (36.7 C), resp. rate 18, height 6\' 1"  (1.854 m), weight 88 kg, SpO2 96 %.    General: NAD, sitting up in bed, much more relaxed HEENT:  R scalp  - shunt in place.  PERRLA Neck: Supple without JVD Heart: Reg rate and rhythm. No murmurs rubs or gallops Chest: CTA bilaterally without wheezes, rales, or rhonchi; no distress.  Abdomen: Soft, minimally tender, less than yesterday, non-distended, bowel sounds positive. Multiple laparoscopic incisions with glue, c/d/i Extremities: No clubbing, cyanosis, or edema.   Psych: Pt's affect is appropriate albeit somewhat angry/upset. Pt is cooperative.   Skin: Clean and intact without  signs of breakdown, healed incision/scar to posterior neck + staples over shunt site c/d/I + Abdominal trochar sites well approximated with dermabond, c/d/I.  Some tenderness to palpation and subjective distention around trocar sites.  Neuro: Alert and oriented x 4   + Speech apraxia and dysarthria - dysarthria severe-ongoing Strength antigravity and against resistance in bilateral upper extremities and lower extremities, no apparent changes from last exam Sensation intact light touch in all 4 extremities + FTN extremely altered R>L today - Severe on R, mild on L-improving daily Musculoskeletal: Normal bulk and tone, no joint swelling noted    Assessment/Plan: 1. Functional deficits which require 3+ hours per day of interdisciplinary therapy in a comprehensive inpatient rehab setting. Physiatrist is providing close team supervision and 24 hour management of active medical problems listed below. Physiatrist and rehab team continue to assess barriers to discharge/monitor patient progress toward functional and medical goals  Care Tool:  Bathing    Body parts bathed by patient: Right arm, Left arm, Chest, Front perineal area, Abdomen, Buttocks, Left upper leg, Left lower leg, Face, Right lower leg, Right upper leg         Bathing assist Assist Level: Minimal Assistance - Patient > 75%  Upper Body Dressing/Undressing Upper body dressing   What is the patient wearing?: Pull over shirt    Upper body assist Assist Level: Contact Guard/Touching assist    Lower Body Dressing/Undressing Lower body dressing      What is the patient wearing?: Underwear/pull up, Pants     Lower body assist Assist for lower body dressing: Minimal Assistance - Patient > 75%     Toileting Toileting    Toileting assist Assist for toileting: Contact Guard/Touching assist     Transfers Chair/bed transfer  Transfers assist     Chair/bed transfer assist level: Minimal Assistance - Patient >  75%     Locomotion Ambulation   Ambulation assist      Assist level: Minimal Assistance - Patient > 75% Assistive device: Walker-rolling Max distance: 150   Walk 10 feet activity   Assist     Assist level: Minimal Assistance - Patient > 75% Assistive device: Walker-rolling   Walk 50 feet activity   Assist    Assist level: Minimal Assistance - Patient > 75% Assistive device: Walker-rolling    Walk 150 feet activity   Assist    Assist level: Minimal Assistance - Patient > 75% Assistive device: Walker-rolling    Walk 10 feet on uneven surface  activity   Assist     Assist level: Minimal Assistance - Patient > 75% Assistive device: Walker-rolling   Wheelchair     Assist Is the patient using a wheelchair?: Yes Type of Wheelchair: Manual    Wheelchair assist level: Minimal Assistance - Patient > 75% Max wheelchair distance: 150'    Wheelchair 50 feet with 2 turns activity    Assist        Assist Level: Minimal Assistance - Patient > 75%   Wheelchair 150 feet activity     Assist      Assist Level: Minimal Assistance - Patient > 75%   Blood pressure 91/64, pulse 75, temperature 98 F (36.7 C), resp. rate 18, height 6\' 1"  (1.854 m), weight 88 kg, SpO2 96 %.   Medical Problem List and Plan: 1. Functional deficits secondary to cerebellar CVA, c/b recent pseudomeningocele repair and Hx AVM resection December 2023..     - 4/4: Note Dr. Alanson Puls stating patient likely did, in fact, have a cerebellar stroke and not just post-operative changes as previously noted. Changing primary Dx to cerebellar infarct.           -patient may shower             -ELOS/Goals: 7-10 days, Mod I PT/OT/SLP; DC goal 4/16             -Admit to CIR -Decadron as indicated 4 MG Q8 HOURS with TAPER STARTING 3/29 to 4/10 - 4/3: Worsening hydrocephalus on CT ->VP shunt placment 4/5 -> adjusted 4/8 to 1.0 with some subjective improvement in ataxia on exam  2.   Antithrombotics: -DVT/anticoagulation:  Pharmaceutical: Lovenox 40mg  QD -antiplatelet therapy: Aspirin 81 mg daily- currently held 08/09/22 d/t surgery, spoke with Dr. Conchita Paris 08/09/22, states it should restart in 1-2 days - > resume 4/9  3. Pain Management: Tylenol as needed, Diazepam 5mg  q8h PRN muscle spasms, Voltaren 1% 2g QID - 3/29: Schedule Tylenol 1000 mg TID, add gabapentin 300 mg QHS for headache and sleep -08/02/22 pt previously on Hydrocodone-APAP 5-325mg  at prior discharge, was still using this, would like to keep; reordered for now, 5-325mg  q6h PRN x7 days, if needed longer then weekday team to discuss; adjusted tylenol  to  TID to avoid overdosing of tylenol; would also prefer gabapentin be scheduled BID rather than PRN -08/03/22 - 4/1 improved control, cont regimen for now -08/09/22 Hydrocodone 5-325mg  q6h PRN readded since he had surgery again yesterday; also added morphine  q4h PRN in case he needs IV pain control postoperatively; monitor closely -08/11/22 pain ongoing, monitor and convert to PO regimen in AM. For HA, Increase gabapentin from 300 mg BID to 300/300/600 -Sleep and headache improved with gabapentin, monitor  4. Mood/Behavior/Sleep: Zoloft 50 mg daily             -antipsychotic agents: N/A   -3/29: Gabapentin as above, melatonin  QHS PRN  -08/02/22 scheduled Gabapentin BID and melatonin QHS per request  -08/03/22 greatly improved sleep - continue  -4/2: Added trazodone 50 mg PRN for ?insomnia; not endorsed by patient.  -4/3: Patient endorses ongoing insomnia, uncertain etiology.  As needed trazodone not utilized, will schedule starting tonight. - sleep stable -08/09/22 slept poorly but suspect could be from missing some meds yesterday and postop recovery; monitor for now -08/10/22 sleep gradually improving, monitor - 4/8: remains poor d.t HA, pain; tx as above - 4/9: Endorses worsening depression, more labile mood secondary to frustration regarding his diagnoses.   Agreeable to increasing antidepressant sertraline from 50 to 75 mg.  Could also consider switching to BuSpar given some studies showing benefit for cerebellar ataxia.  5. Neuropsych/cognition: This patient is capable of making decisions on his own behalf. 6. Skin/Wound Care: Routine skin checks 7. Dysphagia/Fluids/Electrolytes/Nutrition: Routine in and outs with follow-up chemistries  - 3/29: Cr stable, BUN increased, encourage PO fluids and re-test Monday--ordered weekly; stable CR, BUN improved  4/1; stable 4/3; stable 4/8 -08/02/22 takes Vitamin D 2000IU QD and Calcium supplement at home; reordered -08/10/22 pt dislikes diet order/purees and refuses to eat it, would prefer to go back to prior diet (bacon, eggs, mashed potatoes)-- not on SLP schedule today but spoke with speech therapy and asked if they could possibly do a quick eval to see if he can advance his diet today-- they will try to get to him today - 4.8: diet advanced to Dys 3; pt reports improved POS  8.  Hyperlipidemia.  Crestor  daily 9.  Rheumatoid arthritis.  Consider resuming home regimen of rituximab, methotrexate and sulfasalazine after steroids completed (4/10).  Currently on Dexamethasone with taper. -08/02/22 wife concerned with restarting of meds; advised to readdress this once steroids have tapered around 08/13/22   10.  CAD.  Continue low-dose aspirin. Denies CP 11.  Dysphagia. Regular diet with nectar liquids. Follow up SLP 12.  Chronic systolic congestive heart failure.  Monitor for any signs of fluid overload. Daily weight. Currently HOLDING Spironolactone 12.5mg  QD (halved during last hospital stay), Lisinopril  QD, Metoprolol  QD  -3/30-4/5 wt stable, BP stable, continue to monitor -08/09/22 wt stable, BPs soft overnight but somewhat close to prior, improved this morning; monitor closely tonight, may need gentle hydration, hold off on labs for now -4/7-8/24 BPs improving, monitor Filed Weights   08/10/22 0500  08/11/22 0448 08/12/22 0500  Weight: 87.4 kg 88.5 kg 88 kg   Vitals:   08/09/22 0506 08/09/22 0522 08/09/22 1216 08/09/22 1242  BP: (!) 85/61 (!) 87/58 (!) 124/94 120/83   08/09/22 1932 08/10/22 0446 08/10/22 1315 08/10/22 1944  BP: 114/84 111/76 111/86 116/81   08/11/22 0437 08/11/22 1328 08/11/22 2013 08/12/22 0459  BP: 93/60 115/84 121/79 91/64     13.  OSA. Intolerance  to CPAP. Counseling on CPAP benefits.  14. Prediabetes. A1C 6.0. Monitor on CBC/CMP 15. Leukocytosis. Likely due to steroids. Stable. Monitor for s/s infection.    - 10.2->11->10.6 -> 12 4/3 ; monitor   - 4/8: 12.9, post-op, monitor for s/s infection  16. Macular degeneration: takes Preservision AREDS2 and Systane 0.6% eye drops at home -08/02/22 ordered hospital formulary alternatives- Lacrilube BID and Prosight multivitamin QD - 4/1-4/2: Wife concerned regarding use of alternative to systane; recommended bringing in from home  - 4/4: Pharmacy to assist in nonformulary order for home systane; ordered  17. R wrist bruising - stable, improving -08/02/22 wife concerned with bruises to R wrist, appear older/healing, no tenderness or crepitus/deformities, no swelling. Suspect minor pressure related bruise, doubt underlying bony injury, doubt need for xray; likely related to blood thinners; monitor for now  - 4/2: Mild HgB drop 10.6->9.8-> 10.1 -> 9.9 post-op  18.  Hydrocephalus s/p AVM resection and pseudomeningocele repair  - 4/3: Cognitive slowing, increased dysarthria and ataxia.  See above, CT head and labs today for workup.  No focal weakness or sensory changes, no gross cranial nerve deficits to indicate need for acute stroke workup.  Last known normal would have been at bedtime last night. - 4/4: CT head concerning for worsening hydrocephalus. See above.  - 4/5:  Dr. Alanson PulsNundakumar and Dr. Jake Samplesawley recommending VP shunt, family agreeable, plan to OR 4/5 and return to IPR if no complications -08/09/22 VP shunt placed last  night, doing well, some mild abd discomfort but nondistended and no exquisite tenderness; pain control as above - 4.8: pending neurosurgery eval, per note may consider decreasing Strata II valve setting tomorrow to 1.0 or 0.5-adjusted to 1.0, with improvements    LOS: 12 days A FACE TO FACE EVALUATION WAS PERFORMED  Angelina SheriffMorgan C Ethlyn Alto 08/12/2022, 10:56 AM

## 2022-08-12 NOTE — Progress Notes (Signed)
Patient ID: Christopher Yu, male   DOB: 1960-12-19, 62 y.o.   MRN: [id number]  SW met with pt in room to provide updates from team conference, and d/c date remains 4/16.  [id number] left message for pt wife providing updates from team conference, d/c date remains and would like to schedule family edu. SW requested return phone call.   Cecile Sheerer, MSW, LCSWA Office: [phone] Cell: [phone] Fax: [phone]

## 2022-08-13 ENCOUNTER — Inpatient Hospital Stay (HOSPITAL_COMMUNITY): Payer: BC Managed Care – PPO

## 2022-08-13 ENCOUNTER — Telehealth: Payer: Self-pay | Admitting: *Deleted

## 2022-08-13 MED ORDER — TOPIRAMATE 25 MG PO TABS
25.0000 mg | ORAL_TABLET | Freq: Two times a day (BID) | ORAL | Status: DC
  Administered 2022-08-13 – 2022-08-15 (×5): 25 mg via ORAL
  Filled 2022-08-13 (×5): qty 1

## 2022-08-13 MED ORDER — MORPHINE SULFATE (PF) 2 MG/ML IV SOLN
2.0000 mg | Freq: Two times a day (BID) | INTRAVENOUS | Status: DC | PRN
  Administered 2022-08-13 – 2022-08-14 (×2): 2 mg via INTRAVENOUS
  Filled 2022-08-13 (×2): qty 1

## 2022-08-13 MED ORDER — HYDROCODONE-ACETAMINOPHEN 5-325 MG PO TABS
1.0000 | ORAL_TABLET | Freq: Four times a day (QID) | ORAL | Status: DC
  Administered 2022-08-13 – 2022-08-19 (×21): 1 via ORAL
  Filled 2022-08-13 (×24): qty 1

## 2022-08-13 NOTE — Progress Notes (Signed)
Initial Nutrition Assessment  DOCUMENTATION CODES:   Not applicable  INTERVENTION:  - Continue Ensure Enlive po BID, each supplement provides 350 kcal and 20 grams of protein.  - Add MVI q day.   NUTRITION DIAGNOSIS:   Unintentional weight loss related to acute illness as evidenced by percent weight loss.  GOAL:   Patient will meet greater than or equal to 90% of their needs  MONITOR:   PO intake  REASON FOR ASSESSMENT:   Malnutrition Screening Tool    ASSESSMENT:   62 y.o. male admits to CIR related to functional deficits secondary to postoperative changes from recent pseudomeningocele repair with worsening ataxia, dysarthria and dysphagia as well as history of AVM resection December 2023. PMH includes: arthritis, CAD, OSA with CPAP, CHF.  Meds reviewed: TUMS, decadron, Vit D2. Labs reviewed:  WDL.   Pt out of room in CT at time of assessment. Per record, pt has been eating an average of 48% of his meals over the past 7 days. Pt also drinking Ensure BID as ordered. Pt is likely meeting his needs with PO and supplement intakes. Will continue to monitor PO intakes.   NUTRITION - FOCUSED PHYSICAL EXAM:  Assess at follow up.   Diet Order:   Diet Order             DIET DYS 3 Room service appropriate? Yes; Fluid consistency: Nectar Thick  Diet effective now                   EDUCATION NEEDS:   Not appropriate for education at this time  Skin:  Skin Assessment: Reviewed RN Assessment  Last BM:  4/10 - type 3  Height:   Ht Readings from Last 1 Encounters:  08/08/22 6\' 1"  (1.854 m)    Weight:   Wt Readings from Last 1 Encounters:  08/13/22 83.5 kg    Ideal Body Weight:     BMI:  Body mass index is 24.29 kg/m.  Estimated Nutritional Needs:   Kcal:  3474-2595 kcals  Protein:  115-135 gm  Fluid:  >/= 2.2 L  Bethann Humble, RD, LDN, CNSC.

## 2022-08-13 NOTE — Progress Notes (Signed)
Physical Therapy Session Note  Patient Details  Name: Christopher Yu MRN: [id number] Date of Birth: [birth year]  Today's Date: 08/13/2022 PT Individual Time: 1117-1126 PT Individual Time Calculation (min): 9 min   Short Term Goals: Week 2:  PT Short Term Goal 1 (Week 2): Patient will perform bed/chair transfer with LRAD and CGA PT Short Term Goal 2 (Week 2): Patient will ambulate >39' with LRAD and CGA PT Short Term Goal 3 (Week 2): Patient will ascend/descend > a flight of stairs with R HR and CGA  Skilled Therapeutic Interventions/Progress Updates:    Attempted to see pt at 11am, transport arrives to take pt to CT scan and pt missing 60 of out session.  Therapist reattempts at 1117am to make up for missed time in earlier session, pt refusing to participate in full session secondary to pt fatigued from CT scan however agreeable to complete transfer to bed. Pt completes stand pivot transfer with CGA no device, pt demonstrating sit to stand with good immediate standing balance, therapist providing tactile cueing for balance reactions and verbal cues for positioning prior to sit. Pt completes sit to supine with supervision, pt then instructed with removing shoes, completed in long sitting to promote improved mobility needed for functional transfers. Therapist educates pt on importance of rest to promote improved participation with following sessions. Pt declines meal for lunch, therapist notifies nurse tech.  Pt remains supine in bed with all needs within reach, call light in place, and bed alarm activated at end of session. Pt misses 51 out of 60 min, will reattempt as schedule allows.  Therapy Documentation Precautions:  Precautions Precautions: Fall Restrictions Weight Bearing Restrictions: No Other Position/Activity Restrictions: Ataxia, impulsive, impaired problem-solving General: PT Amount of Missed Time (min): 51 Minutes PT Missed Treatment Reason: Patient  unwilling to participate;CT/MRI (pt refusing participation secondary to just returning from CT)   Therapy/Group: Individual Therapy  Edwin Cap PT, DPT 08/13/2022, 12:01 PM

## 2022-08-13 NOTE — Telephone Encounter (Signed)
Patient currently in-patient at [facility]. Patient's spouse would like a call-back at [phone].

## 2022-08-13 NOTE — Telephone Encounter (Signed)
Called and discussed CT head, ongoing headaches.

## 2022-08-13 NOTE — Progress Notes (Signed)
Patient ID: Christopher Yu, male   DOB: 23-Jan-1961, 62 y.o.   MRN: [id number]  SW returned phone call to pt wife who was reporting that she can come in for family edu on Thursday 1pm-4pm. Wife reported she just arrived to hospital. SW will go by and speak with pt and wife.   *SW spoke with scheduling. Reports will have to schedule SLP on Monday morning.   SW met with pt and pt wife in room to discuss above. Wife is overwhelmed and has medical concerns. SW informed will relay to attending. SW explained family education. SW shared concerns with medical team. SW discussed picking him up after appointment. She inquired what is the process if she does not feel comfortable with discharge. SW dicussed process. SW confirmed family edu tomorrow. She can come in for family edu on Monday 9am-10am. States her daughter has an appointmetn on date of discharge.  *SW received updates from scheduling, primary SLP not in on Monday, and can be scheduled with Connye Burkitt on Friday 9:45am-10:30am.  SW spoke with pt wife to discuss family edu change for SLP., She is amenable to session on Friday.   Christopher Yu, MSW, LCSWA Office: [phone] Cell: [phone] Fax: [phone]

## 2022-08-13 NOTE — Progress Notes (Signed)
Patient ID: Christopher Yu, male   DOB: 09/20/1960, 62 y.o.   MRN: [id number]  Received a call from Liz-pt's wife regarding concerns about another CT scan. Have messaged MD to reach out to wife and call her back

## 2022-08-13 NOTE — Progress Notes (Signed)
PROGRESS NOTE   Subjective/Complaints:  Patient endorses ongoing and slightly worsening bilateral headache, without visual changes, nausea, or focal neurologic deficits.  He states this improved somewhat with the morphine, although again, it seems to be worsening overall.  He states that the postop pain in his abdomen is much improved, and that he is passing a lot of gas.  CT head today given persistent headaches, added Topamax to regimen.  Patient's wife was called and discussed the above with her, CT head looks stable from prior, did discuss that it can take some time for changes of hydrocephalus intracranially to change in appearance on follow-up imaging.  Did appreciate some improvement in ataxia and speech apraxia today, so feel overall symptoms are improving.  ROS: +HAs/postop head pain worse, +insomnia-ongoing, +postop abd pain-improving. Denies fevers, chills, N/V, constipation, diarrhea, SOB, cough, chest pain, new weakness or paraesthesias.    Objective:   CT HEAD WO CONTRAST (5MM)  Result Date: 08/13/2022 CLINICAL DATA:  Headache, increasing frequency or severity. VP shunt. Previous resection of left cerebellar arteriovenous malformation. EXAM: CT HEAD WITHOUT CONTRAST TECHNIQUE: Contiguous axial images were obtained from the base of the skull through the vertex without intravenous contrast. RADIATION DOSE REDUCTION: This exam was performed according to the departmental dose-optimization program which includes automated exposure control, adjustment of the mA and/or kV according to patient size and/or use of iterative reconstruction technique. COMPARISON:  08/08/2022 and multiple previous FINDINGS: Brain: Since the prior examination, there has been placement of a VP shunt from a right frontal approach, entering the frontal horn of the right lateral ventricle with its tip in the frontal horn. Ventricular size remains stable. No evidence  of hemorrhage along the shunt track. Previous left suboccipital craniectomy for resection of left cerebellar arteriovenous malformation. Postsurgical defect in the posterior left cerebellum is unchanged. Pseudomeningocele superficially it appears unchanged. It could right superior cerebellar intraparenchymal cyst is unchanged since 1 week ago, measuring 2.8 x 3.5 x 2.5 cm. Continued mass-effect upon the fourth ventricle. Vascular: No abnormal vascular finding presently. Skull: As above Sinuses/Orbits: Clear except for a small amount of fluid in the sphenoid sinus. Other: Negative IMPRESSION: 1. Interval placement of a VP shunt from a right frontal approach, entering the frontal horn of the right lateral ventricle with its tip in the frontal horn. Ventricular size remains stable. No evidence of hemorrhage along the shunt track. 2. Previous left suboccipital craniectomy for resection of left cerebellar arteriovenous malformation. Pseudomeningocele superficially appears unchanged. 3. Right superior cerebellar intraparenchymal cyst is unchanged since 1 week ago, measuring 2.8 x 3.5 x 2.5 cm. Continued mass-effect upon the fourth ventricle. Electronically Signed   By: Paulina FusiMark  Shogry M.D.   On: 08/13/2022 11:55   Recent Labs    08/11/22 0639  WBC 12.9*  HGB 9.9*  HCT 31.0*  PLT 262    Recent Labs    08/11/22 0639  NA 140  K 4.0  CL 101  CO2 30  GLUCOSE 97  BUN 15  CREATININE 0.79  CALCIUM 8.9     Intake/Output Summary (Last 24 hours) at 08/13/2022 1437 Last data filed at 08/13/2022 0500 Gross per 24 hour  Intake 460 ml  Output 1401 ml  Net -941 ml         Physical Exam: Vital Signs Blood pressure 117/81, pulse 93, temperature 98 F (36.7 C), resp. rate 16, height 6\' 1"  (1.854 m), weight 83.5 kg, SpO2 93 %.    General: NAD, sitting up in bed, alert, in mild distress HEENT:  R scalp  - shunt in place.  PERRLA Neck: Supple without JVD Heart: Reg rate and rhythm. No murmurs rubs or  gallops Chest: CTA bilaterally without wheezes, rales, or rhonchi; no distress.  Abdomen: Soft, minimally tender, less than yesterday, non-distended, bowel sounds positive. Multiple laparoscopic incisions with glue, c/d/i Extremities: No clubbing, cyanosis, or edema.   Psych: Pt's affect is appropriate albeit somewhat angry/upset. Pt is cooperative.   Skin: Clean and intact without signs of breakdown, healed incision/scar to posterior neck, with stable fluctuant fluid. + staples over shunt site c/d/I + Abdominal trochar sites well approximated with dermabond, c/d/I.  Reduced tenderness to palpation and subjective distention around trocar sites.  Neuro: Alert and oriented x 4   + Speech apraxia and dysarthria -improved this a.m. Strength antigravity and against resistance in bilateral upper extremities and lower extremities Sensation intact light touch in all 4 extremities + FTN  altered R>L today - Severe on R, mild on L-improving daily Musculoskeletal: Normal bulk and tone, no joint swelling noted    Assessment/Plan: 1. Functional deficits which require 3+ hours per day of interdisciplinary therapy in a comprehensive inpatient rehab setting. Physiatrist is providing close team supervision and 24 hour management of active medical problems listed below. Physiatrist and rehab team continue to assess barriers to discharge/monitor patient progress toward functional and medical goals  Care Tool:  Bathing    Body parts bathed by patient: Right arm, Left arm, Chest, Front perineal area, Abdomen, Buttocks, Left upper leg, Left lower leg, Face, Right lower leg, Right upper leg         Bathing assist Assist Level: Minimal Assistance - Patient > 75%     Upper Body Dressing/Undressing Upper body dressing   What is the patient wearing?: Pull over shirt    Upper body assist Assist Level: Contact Guard/Touching assist    Lower Body Dressing/Undressing Lower body dressing      What is  the patient wearing?: Underwear/pull up, Pants     Lower body assist Assist for lower body dressing: Minimal Assistance - Patient > 75%     Toileting Toileting    Toileting assist Assist for toileting: Minimal Assistance - Patient > 75%     Transfers Chair/bed transfer  Transfers assist     Chair/bed transfer assist level: Minimal Assistance - Patient > 75%     Locomotion Ambulation   Ambulation assist      Assist level: Minimal Assistance - Patient > 75% Assistive device: Walker-rolling Max distance: 144ft   Walk 10 feet activity   Assist     Assist level: Minimal Assistance - Patient > 75% Assistive device: Walker-rolling   Walk 50 feet activity   Assist    Assist level: Minimal Assistance - Patient > 75% Assistive device: Walker-rolling    Walk 150 feet activity   Assist    Assist level: Minimal Assistance - Patient > 75% Assistive device: Walker-rolling    Walk 10 feet on uneven surface  activity   Assist     Assist level: Minimal Assistance - Patient > 75% Assistive device: Development worker, international aid     Assist Is the patient using  a wheelchair?: Yes Type of Wheelchair: Manual    Wheelchair assist level: Minimal Assistance - Patient > 75% Max wheelchair distance: 150'    Wheelchair 50 feet with 2 turns activity    Assist        Assist Level: Minimal Assistance - Patient > 75%   Wheelchair 150 feet activity     Assist      Assist Level: Minimal Assistance - Patient > 75%   Blood pressure 117/81, pulse 93, temperature 98 F (36.7 C), resp. rate 16, height 6\' 1"  (1.854 m), weight 83.5 kg, SpO2 93 %.   Medical Problem List and Plan: 1. Functional deficits secondary to cerebellar CVA, c/b recent pseudomeningocele repair and Hx AVM resection December 2023..     - 4/4: Note Dr. Alanson Puls stating patient likely did, in fact, have a cerebellar stroke and not just post-operative changes as previously noted.  Changing primary Dx to cerebellar infarct.           -patient may shower             -ELOS/Goals: 7-10 days, Mod I PT/OT/SLP; DC goal 4/16             -Admit to CIR -Decadron as indicated 4 MG Q8 HOURS with TAPER STARTING 3/29 to 4/10 - 4/3: Worsening hydrocephalus on CT ->VP shunt placment 4/5 -> adjusted 4/8 to 1.0 with some subjective improvement in ataxia on exam  - 4-10: CT head for worsening headaches, stable.  Treatment as below.  2.  Antithrombotics: -DVT/anticoagulation:  Pharmaceutical: Lovenox 40mg  QD -antiplatelet therapy: Aspirin 81 mg daily- currently held 08/09/22 d/t surgery, spoke with Dr. Conchita Paris 08/09/22, states it should restart in 1-2 days - > resume 4/9  3. Pain Management: Tylenol as needed, Diazepam 5mg  q8h PRN muscle spasms, Voltaren 1% 2g QID - 3/29: Schedule Tylenol 1000 mg TID, add gabapentin 300 mg QHS for headache and sleep -08/02/22 pt previously on Hydrocodone-APAP 5-325mg  at prior discharge, was still using this, would like to keep; reordered for now, 5-325mg  q6h PRN x7 days, if needed longer then weekday team to discuss; adjusted tylenol to 650mg  TID to avoid overdosing of tylenol; would also prefer gabapentin be scheduled BID rather than PRN -08/03/22 - 4/1 improved control, cont regimen for now -08/09/22 Hydrocodone 5-325mg  q6h PRN readded since he had surgery again yesterday; also added morphine 4mg  q4h PRN in case he needs IV pain control postoperatively; monitor closely -08/11/22 pain ongoing, monitor and convert to PO regimen in AM. For HA, Increase gabapentin from 300 mg BID to 300/300/600 -Sleep and headache improved with gabapentin, monitor For-10: Headache worse, diffuse this a.m.  CT head stable.  Patient did not tolerate discontinuation of as needed IV morphine.  Scheduled Norco every 6, added Topamax 25 mg twice daily, reduce morphine to 2 mg IV twice daily as needed.  4. Mood/Behavior/Sleep: Zoloft 50 mg daily             -antipsychotic agents: N/A    -3/29: Gabapentin as above, melatonin 5mg  QHS PRN  -08/02/22 scheduled Gabapentin BID and melatonin QHS per request  -08/03/22 greatly improved sleep - continue  -4/2: Added trazodone 50 mg PRN for ?insomnia; not endorsed by patient.  -4/3: Patient endorses ongoing insomnia, uncertain etiology.  As needed trazodone not utilized, will schedule starting tonight. - sleep stable -08/09/22 slept poorly but suspect could be from missing some meds yesterday and postop recovery; monitor for now -08/10/22 sleep gradually improving, monitor - 4/8: remains poor d.t  HA, pain; tx as above - 4/9: Endorses worsening depression, more labile mood secondary to frustration regarding his diagnoses.  Agreeable to increasing antidepressant sertraline from 50 to 75 mg.  Could also consider switching to BuSpar given some studies showing benefit for cerebellar ataxia.  5. Neuropsych/cognition: This patient is capable of making decisions on his own behalf. 6. Skin/Wound Care: Routine skin checks 7. Dysphagia/Fluids/Electrolytes/Nutrition: Routine in and outs with follow-up chemistries  - 3/29: Cr stable, BUN increased, encourage PO fluids and re-test Monday--ordered weekly; stable CR, BUN improved  4/1; stable 4/3; stable 4/8 -08/02/22 takes Vitamin D 2000IU QD and Calcium supplement at home; reordered -08/10/22 pt dislikes diet order/purees and refuses to eat it, would prefer to go back to prior diet (bacon, eggs, mashed potatoes)-- not on SLP schedule today but spoke with speech therapy and asked if they could possibly do a quick eval to see if he can advance his diet today-- they will try to get to him today - 4.8: diet advanced to Dys 3; pt reports improved POS 4-10: Nutrition evaluated, meeting nutritional needs.  Supplements as needed.  Monitor  8.  Hyperlipidemia.  Crestor  daily 9.  Rheumatoid arthritis.  Consider resuming home regimen of rituximab, methotrexate and sulfasalazine after steroids completed (4/10).   Currently on Dexamethasone with taper. -08/02/22 wife concerned with restarting of meds; advised to readdress this once steroids have tapered around 08/13/22 -4-10: Wife requesting resumption of rituximab, methotrexate, sulfasalazine.  Theoretical risk of increased post-operative infection, however on lit review appears minimal. Will get CBC, CMP in AM to ensure WBC count decreases in setting of completed steroid taper, then resume.   10.  CAD.  Continue low-dose aspirin. Denies CP 11.  Dysphagia. Regular diet with nectar liquids. Follow up SLP 12.  Chronic systolic congestive heart failure.  Monitor for any signs of fluid overload. Daily weight. Currently HOLDING Spironolactone 12.5mg  QD (halved during last hospital stay), Lisinopril  QD, Metoprolol  QD  -3/30-4/5 wt stable, BP stable, continue to monitor -08/09/22 wt stable, BPs soft overnight but somewhat close to prior, improved this morning; monitor closely tonight, may need gentle hydration, hold off on labs for now -4/7-11/24 BPs stable monitor Filed Weights   08/11/22 0448 08/12/22 0500 08/13/22 0500  Weight: 88.5 kg 88 kg 83.5 kg   Vitals:   08/09/22 1932 08/10/22 0446 08/10/22 1315 08/10/22 1944  BP: 114/84 111/76 111/86 116/81   08/11/22 0437 08/11/22 1328 08/11/22 2013 08/12/22 0459  BP: 93/60 115/84 121/79 91/64   08/12/22 1323 08/12/22 1958 08/13/22 0447 08/13/22 1404  BP: 121/75 (!) 119/56 (!) 138/95 117/81     13.  OSA. Intolerance to CPAP. Counseling on CPAP benefits.  14. Prediabetes. A1C 6.0. Monitor on CBC/CMP 15. Leukocytosis. Likely due to steroids. Stable. Monitor for s/s infection.    - 10.2->11->10.6 -> 12 4/3 ; monitor   - 4/8: 12.9, post-op, monitor for s/s infection b- repeat as above  16. Macular degeneration: takes Preservision AREDS2 and Systane 0.6% eye drops at home -08/02/22 ordered hospital formulary alternatives- Lacrilube BID and Prosight multivitamin QD - 4/1-4/2: Wife concerned regarding use  of alternative to systane; recommended bringing in from home  - 4/4: Pharmacy to assist in nonformulary order for home systane; ordered  17. R wrist bruising - stable, improving -08/02/22 wife concerned with bruises to R wrist, appear older/healing, no tenderness or crepitus/deformities, no swelling. Suspect minor pressure related bruise, doubt underlying bony injury, doubt need for xray; likely related to blood  thinners; monitor for now  - 4/2: Mild HgB drop 10.6->9.8-> 10.1 -> 9.9 post-op  18.  Hydrocephalus s/p AVM resection and pseudomeningocele repair  - 4/3: Cognitive slowing, increased dysarthria and ataxia.  See above, CT head and labs today for workup.  No focal weakness or sensory changes, no gross cranial nerve deficits to indicate need for acute stroke workup.  Last known normal would have been at bedtime last night. - 4/4: CT head concerning for worsening hydrocephalus. See above.  - 4/5:  Dr. Alanson Puls and Dr. Jake Samples recommending VP shunt, family agreeable, plan to OR 4/5 and return to IPR if no complications -08/09/22 VP shunt placed last night, doing well, some mild abd discomfort but nondistended and no exquisite tenderness; pain control as above - 4.8: pending neurosurgery eval, per note may consider decreasing Strata II valve setting tomorrow to 1.0 or 0.5-adjusted to 1.0, with improvements - 4/10: CT head stable; Tx headache as above. Discussed with wife that imaging may take some time post-op to improve.     LOS: 13 days A FACE TO FACE EVALUATION WAS PERFORMED  Angelina Sheriff 08/13/2022, 2:37 PM

## 2022-08-13 NOTE — Progress Notes (Signed)
Occupational Therapy Session Note  Patient Details  Name: Christopher Yu MRN: [id number] Date of Birth: 22-Nov-1960  Today's Date: 08/13/2022 OT Individual Time: 1300-1345 OT Individual Time Calculation (min): 45 min    Short Term Goals: Week 2:  OT Short Term Goal 1 (Week 2): Patient will maintain dynamic balance during BADL tasks with no more than min A OT Short Term Goal 2 (Week 2): Patient will reach for objects with R hand and bring to mouth with supervision OT Short Term Goal 3 (Week 2): Patient will tie shoes using B UEs  Skilled Therapeutic Interventions/Progress Updates:    Pt resting in bed with wife present. Supine>sit EOB with supervision. Pt donned shoes without assistance. Stand pivot transfer with min A. Table tasks with focus on open chain GMC/FMC activities with peg board and Squigz with 1.5# wrist weights. Pt completed peg board pattern, removed and cleaned pegs. Pt removed Squigz from table and place them in container. Pt removed items from container and placed on table before again removing and placing on table. Pt returned to room and trnasferred back to bed. Wife present. Bed alarm activated.   Therapy Documentation Precautions:  Precautions Precautions: Fall Restrictions Weight Bearing Restrictions: No Other Position/Activity Restrictions: Ataxia, impulsive, impaired problem-solving Pain: Pain Assessment Pain Scale: 0-10 Pain Score: 4  Pain Location: Head Pain meds admin prior to therapy  Therapy/Group: Individual Therapy  Rich Brave 08/13/2022, 2:47 PM

## 2022-08-13 NOTE — Progress Notes (Signed)
Physical Therapy Session Note  Patient Details  Name: Christopher Yu MRN: [id number] Date of Birth: 11-02-1960  Today's Date: 08/13/2022 PT Individual Time: 1747-1595 and 3967-2897 PT Individual Time Calculation (min): 56 min and 23 min PT Missed Time: 22 minutes PT Missed Time Reason: Fatigue and unwillingness to participate  Short Term Goals: Week 1:  PT Short Term Goal 1 (Week 1): Patient will perform bed/chair transfer with LRAD and CGA PT Short Term Goal 1 - Progress (Week 1): Progressing toward goal PT Short Term Goal 2 (Week 1): Patient will ambulate >66' with LRAD and CGA PT Short Term Goal 2 - Progress (Week 1): Progressing toward goal PT Short Term Goal 3 (Week 1): Patient will ascend/descend > a flight of stairs with R HR and CGA PT Short Term Goal 3 - Progress (Week 1): Progressing toward goal Week 2:  PT Short Term Goal 1 (Week 2): Patient will perform bed/chair transfer with LRAD and CGA PT Short Term Goal 2 (Week 2): Patient will ambulate >108' with LRAD and CGA PT Short Term Goal 3 (Week 2): Patient will ascend/descend > a flight of stairs with R HR and CGA  Skilled Therapeutic Interventions/Progress Updates:   Treatment Session 1 Received pt semi-reclined in bed with RN present at bedside administering medications, then MD arrived for morning rounds. Pt reports headaches are worsening - place for head CT today and consult with Neurosurgery but cleared to participate in therapy today. Pt agreeable to PT treatment and denied any pain during session. Breakfast tray arrived and pt requesting to eat fruit. Session with emphasis on functional mobility/transfers, dressing, eating, generalized strengthening and endurance, dynamic standing balance/coordination, and gait training. Donned pants in supine with supervision and pt transferred semi-reclined<>sitting EOB with HOB slightly elevated and supervision. Donned tennis shoes with supervision and required x 3 attempts  and light min A to stand from low sitting bed (due to posterior LOB) using weighted RW and transferred into WC with CGA. Pt sat in WC and ate yogurt, banana, and drank juice with full supervision and cues to slow down - pt able to recall L head turn without cues when drinking. Pt transported to/from room in [facility] dependently for time management purposes.   Stood with weighted RW and CGA and ambulated 163ft with weighted RW and light min A. Pt with intermittent scissoring but attempting hard to focus and self correct without cues. Pt performed alternating toe taps to 3 cones x 5 trials bilaterally with BUE support and min A for balance with emphasis on control/coordination. Transitioned to sit<>stands without UE support x 10 reps with min/mod A overall with multiple anterior LOB requiring up to max A to correct. Transferred mat<>WC stand<>pivot with weighted RW and CGA and returned to room. Concluded session with pt sitting in WC, needs within reach, and seatbelt alarm on.  Treatment Session 2 Received pt semi-reclined in bed, reporting having a "rough" day, as well as 5/10 pressure headache (premedicated). Pt reported results of earlier CT scan unremarkable and reported feeling fatigued, requesting to "sleep" this afternoon. Pt reported not being able to sleep much at night and upset that staff is not waking him up in the middle of the night to take pain medicine, and as a result wakes up with increased pressure in head thus impacting ability to participate in therapy - secure chatted MD with pt's concerns. Briefly discussed pt's current impairments and what he feels like he needs to continue working on (balance) to be prepared for  D/C on 4/16. Concluded session with pt semi-reclined in bed, needs within reach, and bed alarm on. 22 minutes missed of skilled physical therapy due to fatigue/unwillingness to participate.  Therapy Documentation Precautions:  Precautions Precautions: Fall Restrictions Weight  Bearing Restrictions: No Other Position/Activity Restrictions: Ataxia, impulsive, impaired problem-solving  Therapy/Group: Individual Therapy Martin Majestic PT, DPT  08/13/2022, 7:11 AM

## 2022-08-14 LAB — CBC WITH DIFFERENTIAL/PLATELET
Abs Immature Granulocytes: 0.05 10*3/uL (ref 0.00–0.07)
Basophils Absolute: 0 10*3/uL (ref 0.0–0.1)
Basophils Relative: 0 %
Eosinophils Absolute: 0.3 10*3/uL (ref 0.0–0.5)
Eosinophils Relative: 3 %
HCT: 30.7 % — ABNORMAL LOW (ref 39.0–52.0)
Hemoglobin: 9.4 g/dL — ABNORMAL LOW (ref 13.0–17.0)
Immature Granulocytes: 0 %
Lymphocytes Relative: 21 %
Lymphs Abs: 2.3 10*3/uL (ref 0.7–4.0)
MCH: 22.9 pg — ABNORMAL LOW (ref 26.0–34.0)
MCHC: 30.6 g/dL (ref 30.0–36.0)
MCV: 74.7 fL — ABNORMAL LOW (ref 80.0–100.0)
Monocytes Absolute: 0.7 10*3/uL (ref 0.1–1.0)
Monocytes Relative: 7 %
Neutro Abs: 7.8 10*3/uL — ABNORMAL HIGH (ref 1.7–7.7)
Neutrophils Relative %: 69 %
Platelets: 256 10*3/uL (ref 150–400)
RBC: 4.11 MIL/uL — ABNORMAL LOW (ref 4.22–5.81)
RDW: 20.5 % — ABNORMAL HIGH (ref 11.5–15.5)
WBC: 11.2 10*3/uL — ABNORMAL HIGH (ref 4.0–10.5)
nRBC: 0 % (ref 0.0–0.2)

## 2022-08-14 LAB — COMPREHENSIVE METABOLIC PANEL
ALT: 22 U/L (ref 0–44)
AST: 16 U/L (ref 15–41)
Albumin: 3.2 g/dL — ABNORMAL LOW (ref 3.5–5.0)
Alkaline Phosphatase: 54 U/L (ref 38–126)
Anion gap: 7 (ref 5–15)
BUN: 16 mg/dL (ref 8–23)
CO2: 29 mmol/L (ref 22–32)
Calcium: 8.8 mg/dL — ABNORMAL LOW (ref 8.9–10.3)
Chloride: 104 mmol/L (ref 98–111)
Creatinine, Ser: 0.78 mg/dL (ref 0.61–1.24)
GFR, Estimated: >60 mL/min (ref >60)
Glucose, Bld: 96 mg/dL (ref 70–99)
Potassium: 3.8 mmol/L (ref 3.5–5.1)
Sodium: 140 mmol/L (ref 135–145)
Total Bilirubin: 0.5 mg/dL (ref 0.3–1.2)
Total Protein: 5.9 g/dL — ABNORMAL LOW (ref 6.5–8.1)

## 2022-08-14 MED ORDER — MORPHINE SULFATE (PF) 2 MG/ML IV SOLN
3.0000 mg | Freq: Two times a day (BID) | INTRAVENOUS | Status: AC | PRN
  Administered 2022-08-15 (×2): 3 mg via INTRAVENOUS
  Filled 2022-08-14 (×2): qty 2

## 2022-08-14 MED ORDER — GABAPENTIN 300 MG PO CAPS
600.0000 mg | ORAL_CAPSULE | Freq: Three times a day (TID) | ORAL | Status: DC
  Administered 2022-08-14 – 2022-08-19 (×16): 600 mg via ORAL
  Filled 2022-08-14 (×16): qty 2

## 2022-08-14 NOTE — Progress Notes (Signed)
PROGRESS NOTE   Subjective/Complaints:  Patient seen and examined this a.m.  Denies any side effects to Topamax, gabapentin.  Feels his headache is mildly improved but overall still 7 out of 10 in severity.  Morphine brings it down significantly, thinks he can continue it with only twice a day dosing, but asked that it be increased from 2 to 3 mg.  Did refuse 1 dose of scheduled Norco overnight.   Patient, family, therapies discussed extension of length of stay given his recent VP shunt surgery and backslide in therapies in the postop period due to pain, headache, and fatigue.  Feel this is a reasonable request, and that an additional week of inpatient therapies would continue to benefit the patient's functional outcomes long-term.  ROS: +HAs/postop head pain-mildly improved, +insomnia-improved, +postop abd pain-resolved. Denies fevers, chills, N/V, constipation, diarrhea, SOB, cough, chest pain, new weakness or paraesthesias.    Objective:   CT HEAD WO CONTRAST ( )  Result Date: 08/13/2022 CLINICAL DATA:  Headache, increasing frequency or severity. VP shunt. Previous resection of left cerebellar arteriovenous malformation. EXAM: CT HEAD WITHOUT CONTRAST TECHNIQUE: Contiguous axial images were obtained from the base of the skull through the vertex without intravenous contrast. RADIATION DOSE REDUCTION: This exam was performed according to the departmental dose-optimization program which includes automated exposure control, adjustment of the mA and/or kV according to patient size and/or use of iterative reconstruction technique. COMPARISON:  08/08/2022 and multiple previous FINDINGS: Brain: Since the prior examination, there has been placement of a VP shunt from a right frontal approach, entering the frontal horn of the right lateral ventricle with its tip in the frontal horn. Ventricular size remains stable. No evidence of hemorrhage along  the shunt track. Previous left suboccipital craniectomy for resection of left cerebellar arteriovenous malformation. Postsurgical defect in the posterior left cerebellum is unchanged. Pseudomeningocele superficially it appears unchanged. It could right superior cerebellar intraparenchymal cyst is unchanged since 1 week ago, measuring 2.8 x 3.5 x 2.5 cm. Continued mass-effect upon the fourth ventricle. Vascular: No abnormal vascular finding presently. Skull: As above Sinuses/Orbits: Clear except for a small amount of fluid in the sphenoid sinus. Other: Negative IMPRESSION: 1. Interval placement of a VP shunt from a right frontal approach, entering the frontal horn of the right lateral ventricle with its tip in the frontal horn. Ventricular size remains stable. No evidence of hemorrhage along the shunt track. 2. Previous left suboccipital craniectomy for resection of left cerebellar arteriovenous malformation. Pseudomeningocele superficially appears unchanged. 3. Right superior cerebellar intraparenchymal cyst is unchanged since 1 week ago, measuring 2.8 x 3.5 x 2.5 cm. Continued mass-effect upon the fourth ventricle. Electronically Signed   By: Paulina Fusi M.D.   On: 08/13/2022 11:55   Recent Labs    08/14/22 0505  WBC 11.2*  HGB 9.4*  HCT 30.7*  PLT 256    Recent Labs    08/14/22 0505  NA 140  K 3.8  CL 104  CO2 29  GLUCOSE 96  BUN 16  CREATININE 0.78  CALCIUM 8.8*     Intake/Output Summary (Last 24 hours) at 08/14/2022 1437 Last data filed at 08/14/2022 1323 Gross per 24 hour  Intake  716 ml  Output 1100 ml  Net -384 ml         Physical Exam: Vital Signs Blood pressure 122/79, pulse 75, temperature 97.8 F (36.6 C), temperature source Oral, resp. rate 18, height 6\' 1"  (1.854 m), weight 84.6 kg, SpO2 98 %.    General: NAD, sitting up in bed, alert, in mild distress HEENT:  R scalp  - shunt in place, staples intact.  PERRLA Neck: Supple without JVD Heart: Reg rate and  rhythm. No murmurs rubs or gallops Chest: CTA bilaterally without wheezes, rales, or rhonchi; no distress.  Abdomen: Soft, minimally tender, less than yesterday, non-distended, bowel sounds positive. Multiple laparoscopic incisions with glue, c/d/i Extremities: No clubbing, cyanosis, or edema.   Psych: Pt's affect is appropriate albeit somewhat angry/upset. Pt is cooperative.   Skin: Clean and intact without signs of breakdown, healed incision/scar to posterior neck, with stable fluctuant fluid. + staples over shunt site c/d/I + Abdominal trochar sites well approximated with dermabond, c/d/I.  No tenderness to palpation  Neuro: Alert and oriented x 4. + Right facial droop  + Speech apraxia and dysarthria -slightly improved from operation Strength antigravity and against resistance in bilateral upper extremities and lower extremities Sensation intact light touch in all 4 extremities + Tender to palpation around the fluctuant fluid side and throughout the neck, bilateral temples, and forehead.  + FTN  altered R>L today - Severe on R, mild on L-stable from last exam Musculoskeletal: Normal bulk and tone, no joint swelling noted.  Moving all 4 limbs antigravity and against resistance.    Assessment/Plan: 1. Functional deficits which require 3+ hours per day of interdisciplinary therapy in a comprehensive inpatient rehab setting. Physiatrist is providing close team supervision and 24 hour management of active medical problems listed below. Physiatrist and rehab team continue to assess barriers to discharge/monitor patient progress toward functional and medical goals  Care Tool:  Bathing    Body parts bathed by patient: Right arm, Left arm, Chest, Front perineal area, Abdomen, Buttocks, Left upper leg, Left lower leg, Face, Right lower leg, Right upper leg         Bathing assist Assist Level: Minimal Assistance - Patient > 75%     Upper Body Dressing/Undressing Upper body dressing    What is the patient wearing?: Pull over shirt    Upper body assist Assist Level: Contact Guard/Touching assist    Lower Body Dressing/Undressing Lower body dressing      What is the patient wearing?: Underwear/pull up, Pants     Lower body assist Assist for lower body dressing: Minimal Assistance - Patient > 75%     Toileting Toileting    Toileting assist Assist for toileting: Minimal Assistance - Patient > 75%     Transfers Chair/bed transfer  Transfers assist     Chair/bed transfer assist level: Minimal Assistance - Patient > 75%     Locomotion Ambulation   Ambulation assist      Assist level: Minimal Assistance - Patient > 75% Assistive device: Walker-rolling Max distance: 15580ft   Walk 10 feet activity   Assist     Assist level: Minimal Assistance - Patient > 75% Assistive device: Walker-rolling   Walk 50 feet activity   Assist    Assist level: Minimal Assistance - Patient > 75% Assistive device: Walker-rolling    Walk 150 feet activity   Assist    Assist level: Minimal Assistance - Patient > 75% Assistive device: Walker-rolling    Walk 10 feet  on uneven surface  activity   Assist     Assist level: Minimal Assistance - Patient > 75% Assistive device: Walker-rolling   Wheelchair     Assist Is the patient using a wheelchair?: Yes Type of Wheelchair: Manual    Wheelchair assist level: Minimal Assistance - Patient > 75% Max wheelchair distance: 150'    Wheelchair 50 feet with 2 turns activity    Assist        Assist Level: Minimal Assistance - Patient > 75%   Wheelchair 150 feet activity     Assist      Assist Level: Minimal Assistance - Patient > 75%   Blood pressure 122/79, pulse 75, temperature 97.8 F (36.6 C), temperature source Oral, resp. rate 18, height 6\' 1"  (1.854 m), weight 84.6 kg, SpO2 98 %.   Medical Problem List and Plan: 1. Functional deficits secondary to cerebellar CVA, c/b recent  pseudomeningocele repair and Hx AVM resection December 2023..     - 4/4: Note Dr. Alanson Puls stating patient likely did, in fact, have a cerebellar stroke and not just post-operative changes as previously noted. Changing primary Dx to cerebellar infarct.           -patient may shower             -ELOS/Goals: 7-10 days, Mod I PT/OT/SLP; DC goal extended to 4-23             -Admit to CIR -Decadron as indicated 4 MG Q8 HOURS with TAPER STARTING 3/29 to 4/10 - 4/3: Worsening hydrocephalus on CT ->VP shunt placment 4/5 -> adjusted 4/8 to 1.0 with some subjective improvement in ataxia on exam  - 4-10: CT head for worsening headaches, stable.  Treatment as below.  2.  Antithrombotics: -DVT/anticoagulation:  Pharmaceutical: Lovenox 40mg  QD -antiplatelet therapy: Aspirin 81 mg daily- currently held 08/09/22 d/t surgery, spoke with Dr. Conchita Paris 08/09/22, states it should restart in 1-2 days - > resume 4/9  3. Pain Management: Tylenol as needed, Diazepam 5mg  q8h PRN muscle spasms, Voltaren 1% 2g QID - 3/29: Schedule Tylenol 1000 mg TID, add gabapentin 300 mg QHS for headache and sleep -08/02/22 pt previously on Hydrocodone-APAP 5-325mg  at prior discharge, was still using this, would like to keep; reordered for now, 5-325mg  q6h PRN x7 days, if needed longer then weekday team to discuss; adjusted tylenol to 650mg  TID to avoid overdosing of tylenol; would also prefer gabapentin be scheduled BID rather than PRN -08/03/22 - 4/1 improved control, cont regimen for now -08/09/22 Hydrocodone 5-325mg  q6h PRN readded since he had surgery again yesterday; also added morphine 4mg  q4h PRN in case he needs IV pain control postoperatively; monitor closely -08/11/22 pain ongoing, monitor and convert to PO regimen in AM. For HA, Increase gabapentin from 300 mg BID to 300/300/600 -Sleep and headache improved with gabapentin, monitor For-10: Headache worse, diffuse this a.m.  CT head stable.  Patient did not tolerate discontinuation of  as needed IV morphine.  Scheduled Norco every 6, added Topamax 25 mg twice daily, reduce morphine to 2 mg IV twice daily as needed. 4/10: Maintain Topamax 25 mg twice daily, Norco scheduled.  Increase gabapentin to 600 mg 3 times daily, increase as needed morphine to 3 mg IV twice daily.  Goal to wean off IV off by the end of the week.  4. Mood/Behavior/Sleep: Zoloft 50 mg daily             -antipsychotic agents: N/A   -3/29: Gabapentin as above, melatonin 5mg   QHS PRN  -08/02/22 scheduled Gabapentin BID and melatonin QHS per request  -08/03/22 greatly improved sleep - continue  -4/2: Added trazodone 50 mg PRN for ?insomnia; not endorsed by patient.  -4/3: Patient endorses ongoing insomnia, uncertain etiology.  As needed trazodone not utilized, will schedule starting tonight. - sleep stable -08/09/22 slept poorly but suspect could be from missing some meds yesterday and postop recovery; monitor for now -08/10/22 sleep gradually improving, monitor - 4/8: remains poor d.t HA, pain; tx as above - 4/9: Endorses worsening depression, more labile mood secondary to frustration regarding his diagnoses.  Agreeable to increasing antidepressant sertraline from 50 to 75 mg.  Could also consider switching to BuSpar given some studies showing benefit for cerebellar ataxia. - 4.10: Mood stable, monitor.  5. Neuropsych/cognition: This patient is capable of making decisions on his own behalf. 6. Skin/Wound Care: Routine skin checks 7. Dysphagia/Fluids/Electrolytes/Nutrition: Routine in and outs with follow-up chemistries  - 3/29: Cr stable, BUN increased, encourage PO fluids and re-test Monday--ordered weekly; stable CR, BUN improved  4/1; stable 4/3; stable 4/8 -08/02/22 takes Vitamin D 2000IU QD and Calcium supplement at home; reordered -08/10/22 pt dislikes diet order/purees and refuses to eat it, would prefer to go back to prior diet (bacon, eggs, mashed potatoes)-- not on SLP schedule today but spoke with speech  therapy and asked if they could possibly do a quick eval to see if he can advance his diet today-- they will try to get to him today - 4.8: diet advanced to Dys 3; pt reports improved POS 4-10: Nutrition evaluated, meeting nutritional needs.  Supplements as needed.  Monitor  8.  Hyperlipidemia.  Crestor 20mg  daily 9.  Rheumatoid arthritis.  Consider resuming home regimen of rituximab, methotrexate and sulfasalazine after steroids completed (4/10).  Currently on Dexamethasone with taper. -08/02/22 wife concerned with restarting of meds; advised to readdress this once steroids have tapered around 08/13/22 -4-10: Wife requesting resumption of rituximab, methotrexate, sulfasalazine.  Theoretical risk of increased post-operative infection, however on lit review appears minimal. Will get CBC, CMP in AM to ensure WBC count decreases in setting of completed steroid taper, then resume. - 4-11: No signs or symptoms of infection, white count downtrending after steroid treatment completed.  Will touch base with rheum (Dr. Corliss Skains) about restarting regimen; no direct contrainidications identified.    10.  CAD.  Continue low-dose aspirin. Denies CP 11.  Dysphagia. Regular diet with nectar liquids. Follow up SLP 12.  Chronic systolic congestive heart failure.  Monitor for any signs of fluid overload. Daily weight. Currently HOLDING Spironolactone 12.5mg  QD (halved during last hospital stay), Lisinopril 10mg  QD, Metoprolol 25mg  QD  -3/30-4/5 wt stable, BP stable, continue to monitor -08/09/22 wt stable, BPs soft overnight but somewhat close to prior, improved this morning; monitor closely tonight, may need gentle hydration, hold off on labs for now -4/7-11/24 BPs stable monitor 4/11: Weights downtrending. BP stable. Monitor.  Filed Weights   08/12/22 0500 08/13/22 0500 08/14/22 0512  Weight: 88 kg 83.5 kg 84.6 kg   Vitals:   08/11/22 0437 08/11/22 1328 08/11/22 2013 08/12/22 0459  BP: 93/60 115/84 121/79 91/64    08/12/22 1323 08/12/22 1958 08/13/22 0447 08/13/22 1404  BP: 121/75 (!) 119/56 (!) 138/95 117/81   08/13/22 1512 08/13/22 1946 08/14/22 0512 08/14/22 1243  BP: 128/88 121/81 129/77 122/79     13.  OSA. Intolerance to CPAP. Counseling on CPAP benefits.  14. Prediabetes. A1C 6.0. Monitor on CBC/CMP 15. Leukocytosis. Likely due to  steroids. Stable. Monitor for s/s infection.    - 10.2->11->10.6 -> 12 4/3 ; monitor   - 4/8: 12.9 -> 11 4/11; downtrending  16. Macular degeneration: takes Preservision AREDS2 and Systane 0.6% eye drops at home -08/02/22 ordered hospital formulary alternatives- Lacrilube BID and Prosight multivitamin QD - 4/1-4/2: Wife concerned regarding use of alternative to systane; recommended bringing in from home  - 4/4: Pharmacy to assist in nonformulary order for home systane; ordered  17. R wrist bruising - stable, improving -08/02/22 wife concerned with bruises to R wrist, appear older/healing, no tenderness or crepitus/deformities, no swelling. Suspect minor pressure related bruise, doubt underlying bony injury, doubt need for xray; likely related to blood thinners; monitor for now  - 4/2: Mild HgB drop 10.6->9.8-> 10.1 -> 9.9 post-op  18.  Hydrocephalus s/p AVM resection and pseudomeningocele repair  - 4/3: Cognitive slowing, increased dysarthria and ataxia.  See above, CT head and labs today for workup.  No focal weakness or sensory changes, no gross cranial nerve deficits to indicate need for acute stroke workup.  Last known normal would have been at bedtime last night. - 4/4: CT head concerning for worsening hydrocephalus. See above.  - 4/5:  Dr. Alanson Puls and Dr. Jake Samples recommending VP shunt, family agreeable, plan to OR 4/5 and return to [facility] if no complications -08/09/22 VP shunt placed last night, doing well, some mild abd discomfort but nondistended and no exquisite tenderness; pain control as above - 4.8: pending neurosurgery eval, per note may consider  decreasing Strata II valve setting tomorrow to 1.0 or 0.5-adjusted to 1.0, with improvements - 4/10: CT head stable; Tx headache as above. Discussed with wife that imaging may take some time post-op to improve.     LOS: 14 days A FACE TO FACE EVALUATION WAS PERFORMED  Angelina Sheriff 08/14/2022, 2:37 PM

## 2022-08-14 NOTE — Progress Notes (Signed)
Physical Therapy Weekly Progress Note  Patient Details  Name: Christopher Yu MRN: [id number] Date of Birth: 1961/01/23  Beginning of progress report period: August 01, 2022 End of progress report period: August 14, 2022  Today's Date: 08/14/2022 PT Individual Time: 1st Treatment Session: 8978-4784; 2nd Treatment Session: 1282-0813 PT Individual Time Calculation (min): 50 min; 56 min (missed 4 minutes due to fatigue).  Patient has met 3 of 3 short term goals. Patient is progressing well toward his LTGs since VP shunt placement. Patient currently requires CGA/MinA with all functional mobility with the use of a RW. Patient is limited by his balance, coordination, impaired safety awareness and poor problem solving with limited insight into deficits.   Patient continues to demonstrate the following deficits muscle weakness, decreased cardiorespiratoy endurance, ataxia and decreased coordination, decreased midline orientation, decreased attention, decreased awareness, decreased problem solving, decreased safety awareness, decreased memory, and delayed processing, impaired sensation- peripheral neuropathy, and decreased standing balance and decreased balance strategies and therefore will continue to benefit from skilled PT intervention to increase functional independence with mobility.  Patient progressing toward long term goals..  Continue plan of care.  PT Short Term Goals Week 1:  PT Short Term Goal 1 (Week 1): Patient will perform bed/chair transfer with LRAD and CGA PT Short Term Goal 1 - Progress (Week 1): Progressing toward goal PT Short Term Goal 2 (Week 1): Patient will ambulate >88' with LRAD and CGA PT Short Term Goal 2 - Progress (Week 1): Progressing toward goal PT Short Term Goal 3 (Week 1): Patient will ascend/descend > a flight of stairs with R HR and CGA PT Short Term Goal 3 - Progress (Week 1): Progressing toward goal Week 2:  PT Short Term Goal 1 (Week 2): Patient will  perform bed/chair transfer with LRAD and CGA PT Short Term Goal 1 - Progress (Week 2): Met PT Short Term Goal 2 (Week 2): Patient will ambulate >69' with LRAD and CGA PT Short Term Goal 2 - Progress (Week 2): Met PT Short Term Goal 3 (Week 2): Patient will ascend/descend > a flight of stairs with R HR and CGA PT Short Term Goal 3 - Progress (Week 2): Met Week 3:  PT Short Term Goal 1 (Week 3): STGs=LTGs secondary to ELOS  Skilled Therapeutic Interventions/Progress Updates:  Patient greeted supine in bed and agreeable to PT treatment session. Patient transitioned to sitting EOB with supv and use of bed rail. While sitting EOB, patient was able to don tennis shoes with set-up assistance. Patient stood from EOB with RW and CGA with notable posterior lean, however no steadying assistance required from therapist. Patient gait trained x180' from his room to day room gym with RW and CGA/MinA for stability- 4# weights donned to either side of RW in order to improve overall stability and RW management when walking. Patient then gait trained >150' from day room to main rehab gym with CGA/MinA- VC for decreased cadence when turning secondary to impaired stability and for improved R foot clearance.   Patient ascended/descended x12 steps with B HR and CGA for safety- Patient demonstrated a reciprocal pattern when ascending and descending with good overall stability noted. At home, patient has two ways to enter his home- through the front with 5 steps and L HR vs the garage with 3 steps and R HR. Patient practiced ascending/descending x4 steps with R HR and with L HR with CGA for safety- Patient reported increased confidence when using R HR vs L HR.  Patient gait trained from main gym to ortho gym where he performed a car transfer with RW and CGA- once sitting in the car simulator, patient was able to place B LE into/out of the car independently.   Patient ascended/descended a low grade ramp with RW and CGA-  Minor VC for decreased cadence with good improvements noted.   Patient gait trained from the ortho gym back to his room on the [place] side (>200') with RW and CGA/MinA- As patient fatigued, he demonstrated impaired stability with narrow BOS, poor foot clearance and impaired RW management. VC throughout for decreased cadence (especially when turning) with good improvements noted, however unable to sustain as he fatigues.   Patient left supine in bed with bed alarm on, call bell within reach and all needs met.    2nd Treatment Session- Patient greeted sitting upright in wheelchair in room with spouse present and agreeable to PT treatment session. Focus of treatment session was family training in order to demonstrate patient's CLOF and ensure a safe discharge home. At the beginning of treatment session, therapist and spouse, [name], discussed CLOF, projected level of function, OP PT vs HHC, home entry, walker mobility and adjustments (adding skates and bringing the walker in for therapist to adjust the wheel), DME needs at DC, bed mobility, bed height, bed rails, shower entry/layout, toilet layout, etc. All questions were answered within treatment session. Patient performed car transfer, ascended/descended a low grade ramp and ascended/descended 4 steps with R HR all with CGA. Patient propelled manual wheelchair >150' with B UE in order to improve overall coordination. Patient returned to his room and performed stand pivot transfer back to the bed without the use of an AD and ModA required as patient fell into the bed- Patient educated on improved sequencing, safety awareness and problem-solving next time. Patient left supine in bed with bed alarm on, call bell within reach, spouse and friend present and all needs met.   Therapy Documentation Precautions:  Precautions Precautions: Fall Restrictions Weight Bearing Restrictions: No Other Position/Activity Restrictions: Ataxia, impulsive, impaired  problem-solving   Therapy/Group: Individual Therapy  Anyah Swallow 08/14/2022, 7:48 AM

## 2022-08-14 NOTE — Progress Notes (Signed)
Patient continue to be resting throughout the shift w/o acute distress or complains, request ed  not to be awaken at 2 am for medication because he has difficulty falling back to sleep .Surgical incision areas ( head, neck and abdomen healing well with no drainage,continue to monitor for signs of infection plans to notify MD' will notify the medical team.if this occurs.

## 2022-08-14 NOTE — Progress Notes (Signed)
Occupational Therapy Weekly Progress Note  Patient Details  Name: Christopher Yu MRN: [id number] Date of Birth: 07/17/1960  Beginning of progress report period: August 01, 2022 End of progress report period: August 14, 2022  Today's Date: 08/14/2022 OT Individual Time: 7106-2694 OT Individual Time Calculation (min): 45 min    Patient has met 3 of 3 short term goals.  Patient is making steady progress towards OT goals after he had AV shunt placed Friday 4/5. He still has some ataxia and coordination deficits as well as balance deficits greatly impacting safety within self-care retraining.   Patient continues to demonstrate the following deficits: muscle weakness, unbalanced muscle activation, motor apraxia, ataxia, decreased coordination, and decreased motor planning, decreased motor planning, decreased attention, decreased problem solving, decreased safety awareness, decreased memory, and delayed processing, and decreased sitting balance, decreased standing balance, decreased postural control, and decreased balance strategies and therefore will continue to benefit from skilled OT intervention to enhance overall performance with BADL and Reduce care partner burden.  Patient progressing toward long term goals..  Continue plan of care.  OT Short Term Goals Week 2:  OT Short Term Goal 1 (Week 2): Patient will maintain dynamic balance during BADL tasks with no more than min A OT Short Term Goal 1 - Progress (Week 2): Met OT Short Term Goal 2 (Week 2): Patient will reach for objects with R hand and bring to mouth with supervision OT Short Term Goal 2 - Progress (Week 2): Met OT Short Term Goal 3 (Week 2): Patient will tie shoes using B UEs OT Short Term Goal 3 - Progress (Week 2): Met Week 3:  OT Short Term Goal 1 (Week 3): Patient will demonstrate increased snmoothness and accuracy by slowing movements down while bathing with a wash cloth OT Short Term Goal 2 (Week 3): Patient will  maintain dynamic standing balance without a device when reaching outside base of support and min A OT Short Term Goal 3 (Week 3): Patient will don and doff toothpaste lid without dropping it on 2/3 trials  Skilled Therapeutic Interventions/Progress Updates:    Patient greeted semi-reclined in bed awake and agreeable to OT treatment session. Pt needed cues for breath support while talking to improve speech intelligibility. Pt stood from EOB with weighted RW and CGA, then min A to ambulate into bathroom. Clothing doffed from shower bench with min A for dynamic balance.   Heavy focus on SLOWING down UE movements with wash cloth. Provided hand over hand A to show him how slow he needed to go. Pt was able to demonstrate greatly improved accuracy and decreased dropping of wash cloth with slowed movements. Min A for balance when standing to wash buttocks. Dressing tasks completed form wc with pt able to thread all body parts, but needed min A for balance without AD to pull up lower body clothing. Pt pivoted back to  bed with improved motor control and CGA. Pt left semi-reclined in bed with needs met and call bell in reach.   Therapy Documentation Precautions:  Precautions Precautions: Fall Restrictions Weight Bearing Restrictions: No Other Position/Activity Restrictions: Ataxia, impulsive, impaired problem-solving Pain: Pain Assessment Pain Score: 4  Rest and repositioned     Therapy/Group: Individual Therapy  Mal Amabile 08/14/2022, 9:42 AM

## 2022-08-14 NOTE — Progress Notes (Signed)
Occupational Therapy Session Note  Patient Details  Name: Christopher Yu MRN: [id number] Date of Birth: [birth year]  Today's Date: 08/14/2022 OT Individual Time: 1302-1402 OT Individual Time Calculation (min): 60 min    Short Term Goals: Week 2:  OT Short Term Goal 1 (Week 2): Patient will maintain dynamic balance during BADL tasks with no more than min A OT Short Term Goal 1 - Progress (Week 2): Met OT Short Term Goal 2 (Week 2): Patient will reach for objects with R hand and bring to mouth with supervision OT Short Term Goal 2 - Progress (Week 2): Met OT Short Term Goal 3 (Week 2): Patient will tie shoes using B UEs OT Short Term Goal 3 - Progress (Week 2): Met  Skilled Therapeutic Interventions/Progress Updates:    Pt greeted semi-reclined in bed with spouse present for family education. Discussed extension with pt and wife with spouse very happy with extended LOS. Pt ambulated to therapy apartment with min A, weighted RW, and cues to slow down gait speed to improve coordination. Ptt sat in recliner in simulated home environment> Spouse brought pictures of home bathroom and we reviewed options for shower DME. Likely would benefit from using a BSC in shower as a seat as well as instalation of grab bars. Looked on [employer] for bed rail options and helped spouse find one that swings down. Educated on fine motor exercises using small pegs with focus on in-hand manipulation, translation, and rotation of pegs. Pt returned to room in wc for time management and left seated in wc with alarm belt on, spouse present, and needs met.   Therapy Documentation Precautions:  Precautions Precautions: Fall Restrictions Weight Bearing Restrictions: No Other Position/Activity Restrictions: Ataxia, impulsive, impaired problem-solving Pain:  Denies pain   Therapy/Group: Individual Therapy  Mal Amabile 08/14/2022, 2:12 PM

## 2022-08-14 NOTE — Progress Notes (Signed)
Speech Language Pathology Weekly Progress Note  Patient Details  Name: Christopher Yu MRN: [id number] Date of Birth: 1961-03-13  Beginning of progress report period: August 08, 2022 End of progress report period: August 14, 2022    Short Term Goals: Week 2: SLP Short Term Goal 1 (Week 2): Patient will self-monitor and correct wet vocal quality with mod verbal cues. SLP Short Term Goal 1 - Progress (Week 2): Not met SLP Short Term Goal 2 (Week 2): Patient will self-monitor and correct communication breakdowns with Mod verbal cues. SLP Short Term Goal 2 - Progress (Week 2): Not met SLP Short Term Goal 3 (Week 2): Patient will utilize diaphragmtic breathing at the short phrase level to achieve ~80% intelligibility with Mod verbal and visual cues. SLP Short Term Goal 3 - Progress (Week 2): Not met SLP Short Term Goal 4 (Week 2): Patient will demonstrate sustained attention to functional tasks for 30 minutes with min verbal cues for redirection. SLP Short Term Goal 4 - Progress (Week 2): Met SLP Short Term Goal 5 (Week 2): Patient will consume curent diet with Min verbal cues for use of swallowing compensatory strategies. SLP Short Term Goal 5 - Progress (Week 2): Not met    New Short Term Goals: Week 3: SLP Short Term Goal 1 (Week 3): Patient will self-monitor and correct wet vocal quality with mod verbal cues. SLP Short Term Goal 2 (Week 3): Patient will self-monitor and correct communication breakdowns with Mod verbal cues. SLP Short Term Goal 3 (Week 3): Patient will utilize diaphragmtic breathing at the short phrase level during structured tasks to achieve ~80% intelligibility with Mod verbal and visual cues. SLP Short Term Goal 4 (Week 3): Patient will demonstrate sustained attention to functional tasks for 45 minutes with min verbal cues for redirection. SLP Short Term Goal 5 (Week 3): Patient will consume curent diet with Min verbal cues for use of swallowing compensatory  strategies.  Weekly Progress Updates: Patient has made slow and inconsistent gains this reporting period. Currently, patient is consuming Dys. 3 textures with nectar-thick liquids with intermittent overt s/s of aspiration and Mod verbal cues for use of swallowing compensatory strategies. Patient continues demonstrate a moderate-severe dysarthria and is ~60-70% intelligible at the sentence level with Max verbal cues needed for use of speech intelligibility strategies. Patient also requires Mod verbal cues for attention and overall safety awareness with functional and familiar tasks. Patient and family education ongoing. Patient would benefit from continued skilled SLP intervention to maximize his swallowing and cognitive function as well as his speech intelligibility prior to discharge.      Intensity: Minumum of 1-2 x/day, 30 to 90 minutes Frequency: 1 to 3 out of 7 days Duration/Length of Stay: 08/26/22 Treatment/Interventions: Cognitive remediation/compensation;Cueing hierarchy;Dysphagia/aspiration precaution training;Environmental controls;Functional tasks;Internal/external aids;Speech/Language facilitation;Patient/family education;Therapeutic Activities    [name] 08/14/2022, 7:26 PM

## 2022-08-14 NOTE — Progress Notes (Signed)
Patient ID: Christopher Yu, male   DOB: 11-Dec-1960, 62 y.o.   MRN: [id number]  Medical team reports an extension is appropriate to allow pt to make gains since there were changes in his medical course that would allow him to make gains. Pt will now d/c on 4/23.   1314- SW spoke with pt wife on above. She was currently here in OT session. SW shared will follow-up with updates after team conference.   Cecile Sheerer, MSW, LCSWA Office: [phone] Cell: [phone] Fax: [phone]

## 2022-08-15 MED ORDER — TOPIRAMATE 25 MG PO TABS
50.0000 mg | ORAL_TABLET | Freq: Two times a day (BID) | ORAL | Status: DC
  Administered 2022-08-15 – 2022-08-19 (×8): 50 mg via ORAL
  Filled 2022-08-15 (×8): qty 2

## 2022-08-15 MED ORDER — MORPHINE SULFATE (PF) 2 MG/ML IV SOLN
2.0000 mg | Freq: Two times a day (BID) | INTRAVENOUS | Status: AC | PRN
  Administered 2022-08-16 (×2): 2 mg via INTRAVENOUS
  Filled 2022-08-15 (×2): qty 1

## 2022-08-15 MED ORDER — MORPHINE SULFATE (PF) 2 MG/ML IV SOLN
1.0000 mg | Freq: Two times a day (BID) | INTRAVENOUS | Status: AC | PRN
  Administered 2022-08-17 (×2): 1 mg via INTRAVENOUS
  Filled 2022-08-15 (×2): qty 1

## 2022-08-15 MED ORDER — LORATADINE 10 MG PO TABS
10.0000 mg | ORAL_TABLET | Freq: Every day | ORAL | Status: DC
  Administered 2022-08-15 – 2022-08-20 (×6): 10 mg via ORAL
  Filled 2022-08-15 (×6): qty 1

## 2022-08-15 NOTE — Progress Notes (Signed)
Speech Language Pathology Daily Session Note  Patient Details  Name: Christopher Yu MRN: [id number] Date of Birth: [birth year]  Today's Date: 08/15/2022 SLP Individual Time: 8676-1950 SLP Individual Time Calculation (min): 45 min  Short Term Goals: Week 3: SLP Short Term Goal 1 (Week 3): Patient will self-monitor and correct wet vocal quality with mod verbal cues. SLP Short Term Goal 2 (Week 3): Patient will self-monitor and correct communication breakdowns with Mod verbal cues. SLP Short Term Goal 3 (Week 3): Patient will utilize diaphragmtic breathing at the short phrase level during structured tasks to achieve ~80% intelligibility with Mod verbal and visual cues. SLP Short Term Goal 4 (Week 3): Patient will demonstrate sustained attention to functional tasks for 45 minutes with min verbal cues for redirection. SLP Short Term Goal 5 (Week 3): Patient will consume curent diet with Min verbal cues for use of swallowing compensatory strategies.  Skilled Therapeutic Interventions:   Pt seen for skilled SLP session to address speech and swallowing goals. Wife at bedside for family education.   Speech: Pt expressed frustration with communication breakdowns with some staff members due to his dysarthria as well as other environmental factors. Pt feels that despite use of compensatory speech strategies, he is still not understood. Encouraged continued use of strategies and reviewed environmental changes to reduce communication breakdowns (I.e. closing door, turning off TV, turn up lights). Pt and wife with questions re: prognosis and recovery timeline of speech. Discussed that recovery may take months and that pt's speech may not return to baseline. Discussed word-lists that pt could practice for articulation of specific speech sounds. Pt expressed preference to continue to work on articulation at the conversational level, which is more functional.   Swallowing: Pt requested regular diet due  to limits of Dys 3 diet. Prompted trials of regular consistency cracker and nectar-thick liquids by cup. Pt independently implemented strategies of L head turn with liquids, small bites/sips, and slow pace. No significant oral residue was observed post initial swallow. No overt or subtle s/s of aspiration observed. Discussed recommendation to repeat MBSS to re-assess oropharyngeal swallow function with liquids. Pt and wife in agreement with plan.   Recommend diet advancement to regular diet. Continue nectar-thick liquids by cup with use of aspiration precautions. MBSS tentatively scheduled for Mon, 4/15, to determine if pt is ready for liquid advancement.   Pt left sitting in bed with bed alarm activated and call bell in reach. Wife at bedside. Continue SLP PoC.   Pain Pain Assessment Pain Scale: 0-10 Pain Score: 0-No pain  Therapy/Group: Individual Therapy  Ellery Plunk 08/15/2022, 12:25 PM

## 2022-08-15 NOTE — Progress Notes (Signed)
  NEUROSURGERY PROGRESS NOTE   No issues overnight. Appears to be doing well with SLP, speech somewhat improved.  EXAM:  BP 102/78 (BP Location: Right Arm)   Pulse 95   Temp 98 F (36.7 C) (Oral)   Resp 17   Ht 6\' 1"  (1.854 m)   Wt 88.5 kg   SpO2 94%   BMI 25.74 kg/m   Awake, alert, oriented  Speech remains dysarthric but fluent, appropriate  MAE well Continued RUE ataxia/dysmetria Shunt valve pumps/refills briskly  IMPRESSION:  62 y.o. male s/p right VP shunt, appears to be slowly progressing with therapy   PLAN: - Cont with CIR - Could consider repeat CTH and turn shunt down to 0.5 some time next week depending on progress.   Lisbeth Renshaw, MD [facility]

## 2022-08-15 NOTE — Progress Notes (Signed)
Occupational Therapy Session Note  Patient Details  Name: Christopher Yu MRN: [id number] Date of Birth: 1961-02-27  Today's Date: 08/15/2022 OT Individual Time: 4098-1191 1st Session; 1415-1530 2nd Session  OT Individual Time Calculation (min): 45 min, 75 min    Short Term Goals: Week 3:  OT Short Term Goal 1 (Week 3): Patient will demonstrate increased snmoothness and accuracy by slowing movements down while bathing with a wash cloth OT Short Term Goal 2 (Week 3): Patient will maintain dynamic standing balance without a device when reaching outside base of support and min A OT Short Term Goal 3 (Week 3): Patient will don and doff toothpaste lid without dropping it on 2/3 trials  Skilled Therapeutic Interventions/Progress Updates:    Session 1:  Pt seen for skilled OT session this am. Pt reports having showered yesterday thus requests to toilet then sinkside sponge bathe, dress and groom this session this am. OT reviewed goals for improving graded control and motor fluidity in turn slowing down movement during routine and pt demonstrated understanding of target outcomes. Pt moved to EOB with close S, sit to stand to RW with close S, amb with weighted RW to and from bed to bathroom with CGA. OT noted pt attempting to take short pauses with motor activity to gain better control. Pt completed voiding seated with CGA for elastic waist band lower body garment mngt. Pt amb back to sink side for hand washing, simple UB sponge bathing, oral care standing level with CGA. No episode of dropping items this session.  Pt able to transfer items from hand to hand and bring hands to face with improved fluidity. Amb to EOB for full dressing routine including socks and tennis shoes donning- overall min a fading to CGA with cues to decrease rate especially with tying tennis shoe laces.  Increased time and effort with cues for breathing integration. OT obtained acapella flutter device for expiratory and  incentive spirometer for inspiratory capacity for overall activity tolerance and cdp mngt with activity. Wife arrived and was pleased with this tasks as wife reports pt has tendency to hold his breath, get  SOB and have difficulty during speech and swallowing with breath support. ST arrived for session with hand off while pt bed level and wife bedside.   Pain: pt reports 6/10 pressure in head from eyebrow level, pain meds given by nursing during session, educated on breathing strategies   Session 2:  Pt seen for 2nd skilled OT session with pt on phone bed level with banking due to pt trying to work on his taxes. Pt requested for OT to take brief notes while talking to associate and managing phone and computer screen. OT cues pt to slow motor rate with R UE down for increased accuracy with + effect. Pt able to move to EOB and amb with weighted RW with CGA to toilet and then to sinkside. Pants and undergarment mngt with CGA then standing sink side for hand washing with CGA. OT transported pt to and from apt space in w/c for energy conservation for sink side standing in kitchen. OT used [employer] for uneven surface standing balance challenges. Pt able to perform sit to stand with close S, step up and down with CGA and perform 4 trials of ~ 4-5 min reach intervals. Pt reports difficulty with graded control on slicker objects Ie faux fruit made of plastic. Overall dexterity accuracy 75% with R hand with some dropping of items with increased rate. Once back in room, pt  amb from hallway to bed with CGA with weighted RW and pt able to move to bed level with S. Left pt with bed exit alarm set, needs and nurse call button in reach.    Pain: 7/10 headache with meds given for pain during session with pain reduced to 6/10  Therapy Documentation Precautions:  Precautions Precautions: Fall Restrictions Weight Bearing Restrictions: No Other Position/Activity Restrictions: Ataxia, impulsive, impaired  problem-solving  Therapy/Group: Individual Therapy  Vicenta Dunning 08/15/2022, 12:44 PM

## 2022-08-15 NOTE — Progress Notes (Signed)
PROGRESS NOTE   Subjective/Complaints:   No events overnight. Patient remains with persistent headache, unchanged, bifrontal, worse with use of eyebrows or frontalis muscles. Also endorsing difficulty with oral secretions and wet vocal quality. Has never reacted to an oral antihistamine before.  ROS: +dysarthria c/b wet vocal quality,  +HAs/postop head pain-ongoing, +insomnia-improved, +postop abd pain-resolved. Denies fevers, chills, N/V, constipation, diarrhea, SOB, cough, chest pain, new weakness or paraesthesias.    Objective:   No results found. Recent Labs    08/14/22 0505  WBC 11.2*  HGB 9.4*  HCT 30.7*  PLT 256    Recent Labs    08/14/22 0505  NA 140  K 3.8  CL 104  CO2 29  GLUCOSE 96  BUN 16  CREATININE 0.78  CALCIUM 8.8*     Intake/Output Summary (Last 24 hours) at 08/15/2022 1259 Last data filed at 08/15/2022 1246 Gross per 24 hour  Intake 476 ml  Output --  Net 476 ml         Physical Exam: Vital Signs Blood pressure 110/68, pulse 63, temperature 98.8 F (37.1 C), resp. rate 18, height  (1.854 m), weight 88.5 kg, SpO2 99 %.    General: NAD, sitting up in bed, alert, in mild distress HEENT:  R scalp  - shunt in place, staples intact.  PERRLA. Wet vocal quality  Neck: Supple without JVD Heart: Reg rate and rhythm. No murmurs rubs or gallops Chest: CTA bilaterally without wheezes, rales, or rhonchi; no distress.  Abdomen: Soft, minimally tender, less than yesterday, non-distended, bowel sounds positive. Multiple laparoscopic incisions with glue, c/d/i Extremities: No clubbing, cyanosis, or edema.   Psych: Pt's affect is appropriate . Pt is cooperative.   Skin: Clean and intact without signs of breakdown, healed incision/scar to posterior neck, with fluctuant fluid - mildly decreased + staples over shunt site c/d/I + Abdominal trochar sites well approximated with dermabond, c/d/I.  No  tenderness to palpation  Neuro: Alert and oriented x 4. + Right facial droop  + Speech apraxia and dysarthria -stable Strength antigravity and against resistance in bilateral upper extremities and lower extremities Sensation intact light touch in all 4 extremities + Tender to palpation around the fluctuant fluid side and throughout the neck, bilateral temples, and forehead. - unchanged  + FTN  altered R>L today - Severe on R, mild on L-stable from last exam Musculoskeletal: Normal bulk and tone, no joint swelling noted.  Moving all 4 limbs antigravity and against resistance.    Assessment/Plan: 1. Functional deficits which require 3+ hours per day of interdisciplinary therapy in a comprehensive inpatient rehab setting. Physiatrist is providing close team supervision and 24 hour management of active medical problems listed below. Physiatrist and rehab team continue to assess barriers to discharge/monitor patient progress toward functional and medical goals  Care Tool:  Bathing    Body parts bathed by patient: Right arm, Left arm, Chest, Front perineal area, Abdomen, Buttocks, Left upper leg, Left lower leg, Face, Right lower leg, Right upper leg         Bathing assist Assist Level: Minimal Assistance - Patient > 75%     Upper Body Dressing/Undressing Upper body dressing  What is the patient wearing?: Pull over shirt    Upper body assist Assist Level: Contact Guard/Touching assist    Lower Body Dressing/Undressing Lower body dressing      What is the patient wearing?: Underwear/pull up, Pants     Lower body assist Assist for lower body dressing: Minimal Assistance - Patient > 75%     Toileting Toileting    Toileting assist Assist for toileting: Minimal Assistance - Patient > 75%     Transfers Chair/bed transfer  Transfers assist     Chair/bed transfer assist level: Contact Guard/Touching assist     Locomotion Ambulation   Ambulation assist       Assist level: Minimal Assistance - Patient > 75% Assistive device: Walker-rolling Max distance: 180   Walk 10 feet activity   Assist     Assist level: Minimal Assistance - Patient > 75% Assistive device: Walker-rolling   Walk 50 feet activity   Assist    Assist level: Minimal Assistance - Patient > 75% Assistive device: Walker-rolling    Walk 150 feet activity   Assist    Assist level: Minimal Assistance - Patient > 75% Assistive device: Walker-rolling    Walk 10 feet on uneven surface  activity   Assist     Assist level: Minimal Assistance - Patient > 75% Assistive device: Walker-rolling   Wheelchair     Assist Is the patient using a wheelchair?: Yes Type of Wheelchair: Manual    Wheelchair assist level: Minimal Assistance - Patient > 75% Max wheelchair distance: 150'    Wheelchair 50 feet with 2 turns activity    Assist        Assist Level: Minimal Assistance - Patient > 75%   Wheelchair 150 feet activity     Assist      Assist Level: Minimal Assistance - Patient > 75%   Blood pressure 110/68, pulse 63, temperature 98.8 F (37.1 C), resp. rate 18, height  (1.854 m), weight 88.5 kg, SpO2 99 %.   Medical Problem List and Plan: 1. Functional deficits secondary to cerebellar CVA, c/b recent pseudomeningocele repair and Hx AVM resection December 2023..     - 4/4: Note Dr. Alanson Puls stating patient likely did, in fact, have a cerebellar stroke and not just post-operative changes as previously noted. Changing primary Dx to cerebellar infarct.           -patient may shower             -ELOS/Goals: 7-10 days, Mod I PT/OT/SLP; DC goal extended to 4-23             -Admit to CIR -Decadron as indicated 4 MG Q8 HOURS with TAPER STARTING 3/29 to 4/10 - 4/3: Worsening hydrocephalus on CT ->VP shunt placment 4/5 -> adjusted 4/8 to 1.0 with some subjective improvement in ataxia on exam  - 4-10: CT head for worsening headaches, stable.   Treatment as below.  2.  Antithrombotics: -DVT/anticoagulation:  Pharmaceutical: Lovenox  QD -antiplatelet therapy: Aspirin 81 mg daily- currently held 08/09/22 d/t surgery, spoke with Dr. Conchita Paris 08/09/22, states it should restart in 1-2 days - > resume 4/9  3. Pain Management: Tylenol as needed, Diazepam  q8h PRN muscle spasms, Voltaren 1% 2g QID - 3/29: Schedule Tylenol 1000 mg TID, add gabapentin 300 mg QHS for headache and sleep -08/02/22 pt previously on Hydrocodone-APAP 5-325mg  at prior discharge, was still using this, would like to keep; reordered for now, 5-325mg  q6h PRN x7 days, if needed longer  then weekday team to discuss; adjusted tylenol to 650mg  TID to avoid overdosing of tylenol; would also prefer gabapentin be scheduled BID rather than PRN -08/03/22 - 4/1 improved control, cont regimen for now -08/09/22 Hydrocodone 5-325mg  q6h PRN readded since he had surgery again yesterday; also added morphine 4mg  q4h PRN in case he needs IV pain control postoperatively; monitor closely -08/11/22 pain ongoing, monitor and convert to PO regimen in AM. For HA, Increase gabapentin from 300 mg BID to 300/300/600 -Sleep and headache improved with gabapentin, monitor For-10: Headache worse, diffuse this a.m.  CT head stable.  Patient did not tolerate discontinuation of as needed IV morphine.  Scheduled Norco every 6, added Topamax 25 mg twice daily, reduce morphine to 2 mg IV twice daily as needed. 4/10: Maintain Topamax 25 mg twice daily, Norco scheduled.  Increase gabapentin to 600 mg 3 times daily, increase as needed morphine to 3 mg IV twice daily.  Goal to wean off IV off by the end of the week. 4/12: Increase topomax to 50 mg BID. Weaning morphine PRN over the weekend to 2 mg BID 4/13, 1 mg BID 4/14, then DC.   4. Mood/Behavior/Sleep: Zoloft 50 mg daily             -antipsychotic agents: N/A   -3/29: Gabapentin as above, melatonin 5mg  QHS PRN  -08/02/22 scheduled Gabapentin BID and melatonin QHS  per request  -08/03/22 greatly improved sleep - continue  -4/2: Added trazodone 50 mg PRN for ?insomnia; not endorsed by patient.  -4/3: Patient endorses ongoing insomnia, uncertain etiology.  As needed trazodone not utilized, will schedule starting tonight. - sleep stable -08/09/22 slept poorly but suspect could be from missing some meds yesterday and postop recovery; monitor for now -08/10/22 sleep gradually improving, monitor - 4/8: remains poor d.t HA, pain; tx as above - 4/9: Endorses worsening depression, more labile mood secondary to frustration regarding his diagnoses.  Agreeable to increasing antidepressant sertraline from 50 to 75 mg.  Could also consider switching to BuSpar given some studies showing benefit for cerebellar ataxia. - 4.10-13: Mood stable, monitor.  5. Neuropsych/cognition: This patient is capable of making decisions on his own behalf. 6. Skin/Wound Care: Routine skin checks 7. Dysphagia/Fluids/Electrolytes/Nutrition: Routine in and outs with follow-up chemistries  - 3/29: Cr stable, BUN increased, encourage PO fluids and re-test Monday--ordered weekly; stable CR, BUN improved  4/1; stable 4/3; stable 4/8 -08/02/22 takes Vitamin D 2000IU QD and Calcium supplement at home; reordered -08/10/22 pt dislikes diet order/purees and refuses to eat it, would prefer to go back to prior diet (bacon, eggs, mashed potatoes)-- not on SLP schedule today but spoke with speech therapy and asked if they could possibly do a quick eval to see if he can advance his diet today-- they will try to get to him today - 4.8: diet advanced to Dys 3; pt reports improved POS 4-10: Nutrition evaluated, meeting nutritional needs.  Supplements as needed.  Monitor 4/12: wet vocal quality and feel secretions interfering with POs; add loratidine 1 tab daily for drying, if ineffective can consider scopalamine patch  8.  Hyperlipidemia.  Crestor 20mg  daily 9.  Rheumatoid arthritis.  Consider resuming home regimen of  rituximab, methotrexate and sulfasalazine after steroids completed (4/10).  Currently on Dexamethasone with taper. -08/02/22 wife concerned with restarting of meds; advised to readdress this once steroids have tapered around 08/13/22 -4-10: Wife requesting resumption of rituximab, methotrexate, sulfasalazine.  Theoretical risk of increased post-operative infection, however on lit review appears  minimal. Will get CBC, CMP in AM to ensure WBC count decreases in setting of completed steroid taper, then resume. - 4-11: No signs or symptoms of infection, white count downtrending after steroid treatment completed.  Will touch base with rheum (Dr. Corliss Skains) about restarting regimen; no direct contrainidications identified.  4/12: Per rheumatology, can restart all immunologics 2 weeks post-op (4/19)   10.  CAD.  Continue low-dose aspirin. Denies CP 11.  Dysphagia. Regular diet with nectar liquids. Follow up SLP 12.  Chronic systolic congestive heart failure.  Monitor for any signs of fluid overload. Daily weight. Currently HOLDING Spironolactone 12.5mg  QD (halved during last hospital stay), Lisinopril 10mg  QD, Metoprolol 25mg  QD  -3/30-4/5 wt stable, BP stable, continue to monitor -08/09/22 wt stable, BPs soft overnight but somewhat close to prior, improved this morning; monitor closely tonight, may need gentle hydration, hold off on labs for now -4/7-11/24 BPs stable monitor 4/11: Weights downtrending. BP stable. Monitor.  4/12: mild hypotension today, asymptomatic. PO fluids poor d/t thickened liquids. BMP for AM. Encourage fludis Filed Weights   08/14/22 0512 08/14/22 1428 08/15/22 0500  Weight: 84.6 kg 87.5 kg 88.5 kg   Vitals:   08/12/22 0459 08/12/22 1323 08/12/22 1958 08/13/22 0447  BP: 91/64 121/75 (!) 119/56 (!) 138/95   08/13/22 1404 08/13/22 1512 08/13/22 1946 08/14/22 0512  BP: 117/81 128/88 121/81 129/77   08/14/22 1243 08/14/22 1946 08/15/22 0515 08/15/22 0610  BP: 122/79 (!) 99/55 (!)  84/50 110/68     13.  OSA. Intolerance to CPAP. Counseling on CPAP benefits.  14. Prediabetes. A1C 6.0. Monitor on CBC/CMP 15. Leukocytosis. Likely due to steroids. Stable. Monitor for s/s infection.    - 10.2->11->10.6 -> 12 4/3 ; monitor   - 4/8: 12.9 -> 11 4/11; downtrending  16. Macular degeneration: takes Preservision AREDS2 and Systane 0.6% eye drops at home -08/02/22 ordered hospital formulary alternatives- Lacrilube BID and Prosight multivitamin QD - 4/1-4/2: Wife concerned regarding use of alternative to systane; recommended bringing in from home  - 4/4: Pharmacy to assist in nonformulary order for home systane; ordered  17. R wrist bruising - stable, improving -08/02/22 wife concerned with bruises to R wrist, appear older/healing, no tenderness or crepitus/deformities, no swelling. Suspect minor pressure related bruise, doubt underlying bony injury, doubt need for xray; likely related to blood thinners; monitor for now  - 4/2: Mild HgB drop 10.6->9.8-> 10.1 -> 9.9 post-op  18.  Hydrocephalus s/p AVM resection and pseudomeningocele repair. Can remove staples 4/19.   - 4/3: Cognitive slowing, increased dysarthria and ataxia.  See above, CT head and labs today for workup.  No focal weakness or sensory changes, no gross cranial nerve deficits to indicate need for acute stroke workup.  Last known normal would have been at bedtime last night. - 4/4: CT head concerning for worsening hydrocephalus. See above.  - 4/5:  Dr. Alanson Puls and Dr. Jake Samples recommending VP shunt, family agreeable, plan to OR 4/5 and return to IPR if no complications -08/09/22 VP shunt placed last night, doing well, some mild abd discomfort but nondistended and no exquisite tenderness; pain control as above - 4.8: pending neurosurgery eval, per note may consider decreasing Strata II valve setting tomorrow to 1.0 or 0.5-adjusted to 1.0, with improvements - 4/10: CT head stable; Tx headache as above. Discussed with wife  that imaging may take some time post-op to improve.  - 4.12: If no significant improvement by Monday, will ask for Dr. Conchita Paris to re-evaluate shunt settings (currently  on 1.0)    LOS: 15 days A FACE TO FACE EVALUATION WAS PERFORMED  Angelina Sheriff 08/15/2022, 12:59 PM

## 2022-08-15 NOTE — Progress Notes (Signed)
Physical Therapy Session Note  Patient Details  Name: Christopher Yu MRN: [id number] Date of Birth: [birth year]  Today's Date: 08/15/2022 PT Individual Time: 1115-1200 PT Individual Time Calculation (min): 45 min   Short Term Goals: Week 2:  PT Short Term Goal 1 (Week 2): Patient will perform bed/chair transfer with LRAD and CGA PT Short Term Goal 1 - Progress (Week 2): Met PT Short Term Goal 2 (Week 2): Patient will ambulate >38' with LRAD and CGA PT Short Term Goal 2 - Progress (Week 2): Met PT Short Term Goal 3 (Week 2): Patient will ascend/descend > a flight of stairs with R HR and CGA PT Short Term Goal 3 - Progress (Week 2): Met  Skilled Therapeutic Interventions/Progress Updates: Pt presents propped in bed almost 90 degrees.  Pt agreeable to therapy.  Pt transfers sup to sit w/ supervision.  Pt donned shoes w/ set-up and then in Figure-4 position including tying laces.  Pt transfers sit to stand from elevated bed and CGA.  Pt performed step-pivot transfer bed > w/c w/ CGA.  Pt wheeled to dayroom for time conservation.  Pt performed 2 x 10 stand to sit transfers w/o UE support, verbal and demo cues for forward lean to improve independence.  Pt performed x 10 w/ decreased BOS.  Pt had 1-2 LOB posteriorly when extending too quickly.  Pt amb 45' x 4 w/o AD, verbal cues for posture, weight shift and to minimize R foot slap.  Pt amb w/ RW and min/CGA w/ cues for walker maintenance.  Pt performed x 180, 150' during session.  Pt transferred sit to supine w/ supervision.  Bed alarm on and all needs in reach.     Therapy Documentation Precautions:  Precautions Precautions: Fall Restrictions Weight Bearing Restrictions: No Other Position/Activity Restrictions: Ataxia, impulsive, impaired problem-solving General:   Vital Signs:   Pain:no c/o. Pain Assessment Pain Score: 3      Therapy/Group: Individual Therapy  [name] 08/15/2022, 12:19 PM

## 2022-08-16 DIAGNOSIS — I959 Hypotension, unspecified: Secondary | ICD-10-CM

## 2022-08-16 LAB — BASIC METABOLIC PANEL
Anion gap: 9 (ref 5–15)
BUN: 20 mg/dL (ref 8–23)
CO2: 28 mmol/L (ref 22–32)
Calcium: 8.7 mg/dL — ABNORMAL LOW (ref 8.9–10.3)
Chloride: 102 mmol/L (ref 98–111)
Creatinine, Ser: 0.98 mg/dL (ref 0.61–1.24)
GFR, Estimated: >60 mL/min (ref >60)
Glucose, Bld: 100 mg/dL — ABNORMAL HIGH (ref 70–99)
Potassium: 3.8 mmol/L (ref 3.5–5.1)
Sodium: 139 mmol/L (ref 135–145)

## 2022-08-16 NOTE — Progress Notes (Signed)
Physical Therapy Session Note  Patient Details  Name: Christopher Yu MRN: [id number] Date of Birth: [birth year]  Today's Date: 08/16/2022 PT Individual Time: 9163-8466 PT Individual Time Calculation (min): 60 min   Short Term Goals: Week 2:  PT Short Term Goal 1 (Week 2): Patient will perform bed/chair transfer with LRAD and CGA PT Short Term Goal 1 - Progress (Week 2): Met PT Short Term Goal 2 (Week 2): Patient will ambulate >65' with LRAD and CGA PT Short Term Goal 2 - Progress (Week 2): Met PT Short Term Goal 3 (Week 2): Patient will ascend/descend > a flight of stairs with R HR and CGA PT Short Term Goal 3 - Progress (Week 2): Met  Skilled Therapeutic Interventions/Progress Updates: Pt presents sidelying and asleep.  Pt arouses but received morphine and is somewhat drowsy.  Pt agreeable to therapy.  Pt transfers sup to sit w/ supervision, but HOB elevated.  Pt required seated rest at EOB for increased arousal.  Pt donned shoes w/ set-up but PT tied laces.  Pt transfers sit to stand from elevated bed w/ CGA.  Pt amb to w/c w/ min to CGA.  Pt wheeled to dayroom for energy conservation.  Pt amb x 3 trials w/ RW and CGA, including turns.  Pt amb w/o AD and min occasional mod w/ missteps x 75'.  Pt amb w/ increased cadence from yesterday, R foot slap increased.  Pt performed bouncing and catching blue T-ball w/ spouse, and then again w/ forefeet on wedge.  Pt amb x 150' to room and returned to bed.  Supervision for sit to semi-reclined position.  Bed alarm on and all needs in reach.     Therapy Documentation Precautions:  Precautions Precautions: Fall Restrictions Weight Bearing Restrictions: No Other Position/Activity Restrictions: Ataxia, impulsive, impaired problem-solving General:   Vital Signs:  Pain: no c/o 2/2 received morphine. Pain Assessment Pain Scale: 0-10 Pain Score: 8  Pain Type: Acute pain Pain Location: Head Pain Orientation: Posterior Pain Descriptors  / Indicators: Aching Pain Frequency: Constant Pain Onset: On-going Pain Intervention(s): Medication (See eMAR)    Therapy/Group: Individual Therapy  [name] 08/16/2022, 11:17 AM

## 2022-08-16 NOTE — Progress Notes (Signed)
Physical Therapy Session Note  Patient Details  Name: Christopher Yu MRN: [id number] Date of Birth: 01/05/1961  Today's Date: 08/16/2022 PT Missed Time: 60 Minutes Missed Time Reason: Patient fatigue  Pt received L sidelying in bed in a deep sleep, but able to arouse with verbal stimulus. Therapist inquired about participating in session and pt stated "sleep." Therapist attempted to provided encouragement to participate in session, but pt continues to state "sleep" louder, requesting to be left alone to rest at this time. Pt left in bed with needs in reach and bed alarm on.  Ginny Forth , PT, DPT, NCS, CSRS 08/16/2022, 3:42 PM

## 2022-08-16 NOTE — Progress Notes (Signed)
PROGRESS NOTE   Subjective/Complaints:  No concerns this morning, pain has been improving and is managed well with meds. Slept poorly but doesn't want to make any adjustments to meds-- thinks it's just because he's not home. LBM 2 days ago. Urinating fine. Denies any other complaints or concerns.   ROS: +dysarthria c/b wet vocal quality,  +HAs/postop head pain-ongoing, improving +insomnia-fluctuating, +postop abd pain-resolved. Denies fevers, chills, N/V, constipation, diarrhea, SOB, cough, chest pain, new weakness or paraesthesias.    Objective:   No results found. Recent Labs    08/14/22 0505  WBC 11.2*  HGB 9.4*  HCT 30.7*  PLT 256   Recent Labs    08/14/22 0505 08/16/22 0729  NA 140 139  K 3.8 3.8  CL 104 102  CO2 29 28  GLUCOSE 96 100*  BUN 16 20  CREATININE 0.78 0.98  CALCIUM 8.8* 8.7*    Intake/Output Summary (Last 24 hours) at 08/16/2022 1502 Last data filed at 08/16/2022 0700 Gross per 24 hour  Intake 472 ml  Output --  Net 472 ml        Physical Exam: Vital Signs Blood pressure 97/68, pulse 64, temperature 97.7 F (36.5 C), temperature source Oral, resp. rate 17, height  (1.854 m), weight 89.4 kg, SpO2 94 %.    General: NAD, alert, laying in bed HEENT:  R scalp  - shunt in place, staples intact.  PERRLA. Wet vocal quality  Neck: Supple without JVD Heart: Reg rate and rhythm. No murmurs rubs or gallops Chest: CTA bilaterally without wheezes, rales, or rhonchi; no distress.  Abdomen: Soft, nonTTP, non-distended, bowel sounds positive. Multiple laparoscopic incisions with glue, c/d/i Extremities: No clubbing, cyanosis, or edema.   Psych: Pt's affect is appropriate . Pt is cooperative.   Skin: Clean and intact without signs of breakdown, healed incision/scar to posterior neck, with fluctuant fluid - mildly decreased--not reassessed today + staples over shunt site c/d/I + Abdominal trochar  sites well approximated with dermabond, c/d/I.  No tenderness to palpation  PRIOR EXAMS: Neuro: Alert and oriented x 4. + Right facial droop  + Speech apraxia and dysarthria -stable Strength antigravity and against resistance in bilateral upper extremities and lower extremities Sensation intact light touch in all 4 extremities + Tender to palpation around the fluctuant fluid side and throughout the neck, bilateral temples, and forehead. - unchanged  + FTN  altered R>L today - Severe on R, mild on L-stable from last exam Musculoskeletal: Normal bulk and tone, no joint swelling noted.  Moving all 4 limbs antigravity and against resistance.    Assessment/Plan: 1. Functional deficits which require 3+ hours per day of interdisciplinary therapy in a comprehensive inpatient rehab setting. Physiatrist is providing close team supervision and 24 hour management of active medical problems listed below. Physiatrist and rehab team continue to assess barriers to discharge/monitor patient progress toward functional and medical goals  Care Tool:  Bathing    Body parts bathed by patient: Right arm, Left arm, Chest, Front perineal area, Abdomen, Buttocks, Left upper leg, Left lower leg, Face, Right lower leg, Right upper leg         Bathing assist Assist Level: Minimal  Assistance - Patient > 75%     Upper Body Dressing/Undressing Upper body dressing   What is the patient wearing?: Pull over shirt    Upper body assist Assist Level: Contact Guard/Touching assist    Lower Body Dressing/Undressing Lower body dressing      What is the patient wearing?: Underwear/pull up, Pants     Lower body assist Assist for lower body dressing: Minimal Assistance - Patient > 75%     Toileting Toileting    Toileting assist Assist for toileting: Minimal Assistance - Patient > 75%     Transfers Chair/bed transfer  Transfers assist     Chair/bed transfer assist level: Contact Guard/Touching assist      Locomotion Ambulation   Ambulation assist      Assist level: Minimal Assistance - Patient > 75% Assistive device: Walker-rolling Max distance: 180   Walk 10 feet activity   Assist     Assist level: Minimal Assistance - Patient > 75% Assistive device: Walker-rolling   Walk 50 feet activity   Assist    Assist level: Minimal Assistance - Patient > 75% Assistive device: Walker-rolling    Walk 150 feet activity   Assist    Assist level: Minimal Assistance - Patient > 75% Assistive device: Walker-rolling    Walk 10 feet on uneven surface  activity   Assist     Assist level: Minimal Assistance - Patient > 75% Assistive device: Development worker, international aid     Assist Is the patient using a wheelchair?: Yes Type of Wheelchair: Manual    Wheelchair assist level: Minimal Assistance - Patient > 75% Max wheelchair distance: 150'    Wheelchair 50 feet with 2 turns activity    Assist        Assist Level: Minimal Assistance - Patient > 75%   Wheelchair 150 feet activity     Assist      Assist Level: Minimal Assistance - Patient > 75%   Blood pressure 97/68, pulse 64, temperature 97.7 F (36.5 C), temperature source Oral, resp. rate 17, height 6\' 1"  (1.854 m), weight 89.4 kg, SpO2 94 %.   Medical Problem List and Plan: 1. Functional deficits secondary to cerebellar CVA, c/b recent pseudomeningocele repair and Hx AVM resection December 2023..     - 4/4: Note Dr. Alanson Puls stating patient likely did, in fact, have a cerebellar stroke and not just post-operative changes as previously noted. Changing primary Dx to cerebellar infarct.           -patient may shower             -ELOS/Goals: 7-10 days, Mod I PT/OT/SLP; DC goal extended to 4/23             -Admit to CIR -Decadron as indicated 4 MG Q8 HOURS with TAPER STARTING 3/29 to 4/10 - 4/3: Worsening hydrocephalus on CT ->VP shunt placment 4/5 -> adjusted 4/8 to 1.0 with some subjective  improvement in ataxia on exam  - 4/10: CT head for worsening headaches, stable.  Treatment as below.  2.  Antithrombotics: -DVT/anticoagulation:  Pharmaceutical: Lovenox 40mg  QD -antiplatelet therapy: Aspirin 81 mg daily- currently held 08/09/22 d/t surgery, spoke with Dr. Conchita Paris 08/09/22, states it should restart in 1-2 days - > resume 4/9  3. Pain Management: Tylenol as needed, Diazepam 5mg  q8h PRN muscle spasms, Voltaren 1% 2g QID - 3/29: Schedule Tylenol 1000 mg TID, add gabapentin 300 mg QHS for headache and sleep -08/02/22 pt previously on Hydrocodone-APAP 5-325mg  at prior  discharge, was still using this, would like to keep; reordered for now, 5-325mg  q6h PRN x7 days, if needed longer then weekday team to discuss; adjusted tylenol to 650mg  TID to avoid overdosing of tylenol; would also prefer gabapentin be scheduled BID rather than PRN -08/03/22 - 4/1 improved control, cont regimen for now -08/09/22 Hydrocodone 5-325mg  q6h PRN readded since he had surgery again yesterday; also added morphine 4mg  q4h PRN in case he needs IV pain control postoperatively; monitor closely -08/11/22 pain ongoing, monitor and convert to PO regimen in AM. For HA, Increase gabapentin from 300 mg BID to 300/300/600 -Sleep and headache improved with gabapentin, monitor -4/10: Headache worse, diffuse this a.m.  CT head stable.  Patient did not tolerate discontinuation of as needed IV morphine.  Scheduled Norco every 6, added Topamax 25 mg twice daily, reduce morphine to 2 mg IV twice daily as needed. -4/10: Maintain Topamax 25 mg twice daily, Norco scheduled.  Increase gabapentin to 600 mg 3 times daily, increase as needed morphine to 3 mg IV twice daily.  Goal to wean off IV off by the end of the week. -4/12: Increase topomax to 50 mg BID. Weaning morphine PRN over the weekend to 2 mg BID PRN 4/13, 1 mg BID PRN 4/14, then DC.   -08/16/22 pain managed for now, monitor with weaning morphine 4. Mood/Behavior/Sleep: Zoloft 50 mg  daily             -antipsychotic agents: N/A   -3/29: Gabapentin as above, melatonin 5mg  QHS PRN  -08/02/22 scheduled Gabapentin BID and melatonin QHS per request  -08/03/22 greatly improved sleep - continue  -4/2: Added trazodone 50 mg PRN for ?insomnia; not endorsed by patient.  -4/3: Patient endorses ongoing insomnia, uncertain etiology.  As needed trazodone not utilized, will schedule starting tonight. - sleep stable -08/09/22 slept poorly but suspect could be from missing some meds yesterday and postop recovery; monitor for now -08/10/22 sleep gradually improving, monitor - 4/8: remains poor d.t HA, pain; tx as above - 4/9: Endorses worsening depression, more labile mood secondary to frustration regarding his diagnoses.  Agreeable to increasing antidepressant sertraline from 50 to 75 mg.  Could also consider switching to BuSpar given some studies showing benefit for cerebellar ataxia. - 4/10-13: Mood stable, monitor.  5. Neuropsych/cognition: This patient is capable of making decisions on his own behalf. 6. Skin/Wound Care: Routine skin checks 7. Dysphagia/Fluids/Electrolytes/Nutrition: Routine in and outs with follow-up chemistries  - 3/29: Cr stable, BUN increased, encourage PO fluids and re-test Monday--ordered weekly; stable CR, BUN improved  4/1; stable 4/3; stable 4/8 -08/02/22 takes Vitamin D 2000IU QD and Calcium supplement at home; reordered -08/10/22 pt dislikes diet order/purees and refuses to eat it, would prefer to go back to prior diet (bacon, eggs, mashed potatoes)-- not on SLP schedule today but spoke with speech therapy and asked if they could possibly do a quick eval to see if he can advance his diet today-- they will try to get to him today - 4.8: diet advanced to Dys 3; pt reports improved POS -4/10: Nutrition evaluated, meeting nutritional needs.  Supplements as needed.  Monitor -4/12: wet vocal quality and feel secretions interfering with POs; add loratidine 1 tab daily for  drying, if ineffective can consider scopalamine patch  8.  Hyperlipidemia.  Crestor 20mg  daily 9.  Rheumatoid arthritis.  Consider resuming home regimen of rituximab, methotrexate and sulfasalazine after steroids completed (4/10).  Currently on Dexamethasone with taper. -08/02/22 wife concerned with restarting of  meds; advised to readdress this once steroids have tapered around 08/13/22 -4-10: Wife requesting resumption of rituximab, methotrexate, sulfasalazine.  Theoretical risk of increased post-operative infection, however on lit review appears minimal. Will get CBC, CMP in AM to ensure WBC count decreases in setting of completed steroid taper, then resume. - 4-11: No signs or symptoms of infection, white count downtrending after steroid treatment completed.  Will touch base with rheum (Dr. Corliss Skains) about restarting regimen; no direct contrainidications identified.  -4/12: Per rheumatology, can restart all immunologics 2 weeks post-op (4/19)   10.  CAD.  Continue low-dose aspirin. Denies CP 11.  Dysphagia. Regular diet with nectar liquids. Follow up SLP 12.  Chronic systolic congestive heart failure.  Monitor for any signs of fluid overload. Daily weight. Currently HOLDING Spironolactone 12.5mg  QD (halved during last hospital stay), Lisinopril 10mg  QD, Metoprolol 25mg  QD  -3/30-4/5 wt stable, BP stable, continue to monitor -08/09/22 wt stable, BPs soft overnight but somewhat close to prior, improved this morning; monitor closely tonight, may need gentle hydration, hold off on labs for now -4/7-11/24 BPs stable monitor -4/11: Weights downtrending. BP stable. Monitor.  -4/12: mild hypotension today, asymptomatic. PO fluids poor d/t thickened liquids. BMP for AM. Encourage fludis -08/16/22 wt stable, BP still soft but stable, BMP unremarkable; monitor Filed Weights   08/14/22 1428 08/15/22 0500 08/16/22 0700  Weight: 87.5 kg 88.5 kg 89.4 kg   Vitals:   08/13/22 1404 08/13/22 1512 08/13/22 1946  08/14/22 0512  BP: 117/81 128/88 121/81 129/77   08/14/22 1243 08/14/22 1946 08/15/22 0515 08/15/22 0610  BP: 122/79 (!) 99/55 (!) 84/50 110/68   08/15/22 1305 08/15/22 2011 08/16/22 0545 08/16/22 1303  BP: 102/78 119/84 (!) 93/54 97/68     13.  OSA. Intolerance to CPAP. Counseling on CPAP benefits.  14. Prediabetes. A1C 6.0. Monitor on CBC/CMP 15. Leukocytosis. Likely due to steroids. Stable. Monitor for s/s infection.    - 10.2->11->10.6 -> 12 4/3 ; monitor   - 4/8: 12.9 -> 11 4/11; downtrending  16. Macular degeneration: takes Preservision AREDS2 and Systane 0.6% eye drops at home -08/02/22 ordered hospital formulary alternatives- Lacrilube BID and Prosight multivitamin QD - 4/1-4/2: Wife concerned regarding use of alternative to systane; recommended bringing in from home  - 4/4: Pharmacy to assist in nonformulary order for home systane; ordered  17. R wrist bruising - stable, improving -08/02/22 wife concerned with bruises to R wrist, appear older/healing, no tenderness or crepitus/deformities, no swelling. Suspect minor pressure related bruise, doubt underlying bony injury, doubt need for xray; likely related to blood thinners; monitor for now  - 4/2: Mild HgB drop 10.6->9.8-> 10.1 -> 9.9 post-op  18.  Hydrocephalus s/p AVM resection and pseudomeningocele repair. Can remove staples 4/19.   - 4/3: Cognitive slowing, increased dysarthria and ataxia.  See above, CT head and labs today for workup.  No focal weakness or sensory changes, no gross cranial nerve deficits to indicate need for acute stroke workup.  Last known normal would have been at bedtime last night. - 4/4: CT head concerning for worsening hydrocephalus. See above.  - 4/5:  Dr. Alanson Puls and Dr. Jake Samples recommending VP shunt, family agreeable, plan to OR 4/5 and return to IPR if no complications -08/09/22 VP shunt placed last night, doing well, some mild abd discomfort but nondistended and no exquisite tenderness; pain control  as above - 4.8: pending neurosurgery eval, per note may consider decreasing Strata II valve setting tomorrow to 1.0 or 0.5-adjusted to 1.0, with  improvements - 4/10: CT head stable; Tx headache as above. Discussed with wife that imaging may take some time post-op to improve.  - 4/12: If no significant improvement by Monday, will ask for Dr. Conchita Paris to re-evaluate shunt settings (currently on 1.0) -08/16/22 Dr. Conchita Paris following, appreciate assistance    LOS: 16 days A FACE TO [facility] EVALUATION WAS PERFORMED  [address] 08/16/2022, 3:02 PM

## 2022-08-16 NOTE — Progress Notes (Addendum)
Occupational Therapy Session Note  Patient Details  Name: Christopher Yu MRN: [id number] Date of Birth: 10/06/1960  Today's Date: 08/16/2022 OT Individual Time: 1445-1530 OT Individual Time Calculation (min): 45 min    Short Term Goals: Week 3:  OT Short Term Goal 1 (Week 3): Patient will demonstrate increased snmoothness and accuracy by slowing movements down while bathing with a wash cloth OT Short Term Goal 2 (Week 3): Patient will maintain dynamic standing balance without a device when reaching outside base of support and min A OT Short Term Goal 3 (Week 3): Patient will don and doff toothpaste lid without dropping it on 2/3 trials  Skilled Therapeutic Interventions/Progress Updates:    Patient received supine in bed sleeping soundly.  Difficult to arouse - but once awake - agreeable to OT.  Patient indicates walking is his biggest need.  Transferred to wheelchair with supervision using weighted walker.  Transported to gym in wheelchair.   In gym, worked on static to dynamic stand balance and gross/fine motor coordination skills.  Patient able to stand and weight shift without walker - reach overhead.  Patient had difficulty manipulating objects in hand while standing - however with repetition - showed improved in hand manipulation.   Worked on coordiantion of breathing with transitional movements.  Patient actually showed improved control with stand to sit transition when coordinated with prolonged exhalation.  Needed cueing to complete - but able to replicate inhalation sit to stand/ exhalation stand to sit.  Seems to be helpful for postural control this session.   Patient walked back to room with one seated rest break, and returned to bed for 1 hour rest before final PT session.  Bed alarm set and call bell/ personal items in reach.    Therapy Documentation Precautions:  Precautions Precautions: Fall Restrictions Weight Bearing Restrictions: No Other Position/Activity  Restrictions: Ataxia, impulsive, impaired problem-solving  Pain:  Denies pain - reports he often wakes up with a bad headache - but has had medicine and no pain currently     Therapy/Group: Individual Therapy  Collier Salina 08/16/2022, 3:41 PM

## 2022-08-17 NOTE — Progress Notes (Signed)
PROGRESS NOTE   Subjective/Complaints:  No concerns this morning, resting comfortably, pain has been improving and is managed well with meds still. Slept a little better last night. LBM 2-3 days ago but still doesn't feel constipated and doesn't want to make any med adjustments right now. Urinating fine. Denies any other complaints or concerns.   ROS: +dysarthria c/b wet vocal quality-improving,  +HAs/postop head pain-ongoing, improving +insomnia-fluctuating, +postop abd pain-resolved. Denies fevers, chills, N/V, constipation, diarrhea, SOB, cough, chest pain, new weakness or paraesthesias.    Objective:   No results found. No results for input(s): "WBC", "HGB", "HCT", "PLT" in the last 72 hours.  Recent Labs    08/16/22 0729  NA 139  K 3.8  CL 102  CO2 28  GLUCOSE 100*  BUN 20  CREATININE 0.98  CALCIUM 8.7*    Intake/Output Summary (Last 24 hours) at 08/17/2022 1150 Last data filed at 08/17/2022 0700 Gross per 24 hour  Intake 1200 ml  Output --  Net 1200 ml        Physical Exam: Vital Signs Blood pressure 107/71, pulse 77, temperature 97.6 F (36.4 C), resp. rate 18, height 6\' 1"  (1.854 m), weight 89.1 kg, SpO2 96 %.    General: NAD, alert, laying in bed resting comfortably HEENT:  R scalp  - shunt in place, staples intact.  PERRLA. Wet vocal quality somewhat improved Neck: Supple without JVD Heart: Reg rate and rhythm. No murmurs rubs or gallops Chest: CTA bilaterally without wheezes, rales, or rhonchi; no distress.  Abdomen: Soft, nonTTP, non-distended, bowel sounds positive. Multiple laparoscopic incisions with glue, c/d/i Extremities: No clubbing, cyanosis, or edema.   Psych: Pt's affect is appropriate . Pt is cooperative.   Skin: Clean and intact without signs of breakdown, healed incision/scar to posterior neck, with fluctuant fluid - mildly decreased--not reassessed today + staples over shunt site  c/d/I + Abdominal trochar sites well approximated with dermabond, c/d/I.  No tenderness to palpation  PRIOR EXAMS: Neuro: Alert and oriented x 4. + Right facial droop  + Speech apraxia and dysarthria -stable Strength antigravity and against resistance in bilateral upper extremities and lower extremities Sensation intact light touch in all 4 extremities + Tender to palpation around the fluctuant fluid side and throughout the neck, bilateral temples, and forehead. - unchanged  + FTN  altered R>L today - Severe on R, mild on L-stable from last exam Musculoskeletal: Normal bulk and tone, no joint swelling noted.  Moving all 4 limbs antigravity and against resistance.    Assessment/Plan: 1. Functional deficits which require 3+ hours per day of interdisciplinary therapy in a comprehensive inpatient rehab setting. Physiatrist is providing close team supervision and 24 hour management of active medical problems listed below. Physiatrist and rehab team continue to assess barriers to discharge/monitor patient progress toward functional and medical goals  Care Tool:  Bathing    Body parts bathed by patient: Right arm, Left arm, Chest, Front perineal area, Abdomen, Buttocks, Left upper leg, Left lower leg, Face, Right lower leg, Right upper leg         Bathing assist Assist Level: Minimal Assistance - Patient > 75%     Upper Body  Dressing/Undressing Upper body dressing   What is the patient wearing?: Pull over shirt    Upper body assist Assist Level: Contact Guard/Touching assist    Lower Body Dressing/Undressing Lower body dressing      What is the patient wearing?: Underwear/pull up, Pants     Lower body assist Assist for lower body dressing: Minimal Assistance - Patient > 75%     Toileting Toileting    Toileting assist Assist for toileting: Minimal Assistance - Patient > 75%     Transfers Chair/bed transfer  Transfers assist     Chair/bed transfer assist level:  Contact Guard/Touching assist     Locomotion Ambulation   Ambulation assist      Assist level: Minimal Assistance - Patient > 75% Assistive device: Walker-rolling Max distance: 180   Walk 10 feet activity   Assist     Assist level: Minimal Assistance - Patient > 75% Assistive device: Walker-rolling   Walk 50 feet activity   Assist    Assist level: Minimal Assistance - Patient > 75% Assistive device: Walker-rolling    Walk 150 feet activity   Assist    Assist level: Minimal Assistance - Patient > 75% Assistive device: Walker-rolling    Walk 10 feet on uneven surface  activity   Assist     Assist level: Minimal Assistance - Patient > 75% Assistive device: Development worker, international aid     Assist Is the patient using a wheelchair?: Yes Type of Wheelchair: Manual    Wheelchair assist level: Minimal Assistance - Patient > 75% Max wheelchair distance: 150'    Wheelchair 50 feet with 2 turns activity    Assist        Assist Level: Minimal Assistance - Patient > 75%   Wheelchair 150 feet activity     Assist      Assist Level: Minimal Assistance - Patient > 75%   Blood pressure 107/71, pulse 77, temperature 97.6 F (36.4 C), resp. rate 18, height 6\' 1"  (1.854 m), weight 89.1 kg, SpO2 96 %.   Medical Problem List and Plan: 1. Functional deficits secondary to cerebellar CVA, c/b recent pseudomeningocele repair and Hx AVM resection December 2023..     - 4/4: Note Dr. Alanson Puls stating patient likely did, in fact, have a cerebellar stroke and not just post-operative changes as previously noted. Changing primary Dx to cerebellar infarct.           -patient may shower             -ELOS/Goals: 7-10 days, Mod I PT/OT/SLP; DC goal extended to 4/23             -Admit to CIR -Decadron as indicated 4 MG Q8 HOURS with TAPER STARTING 3/29 to 4/10 - 4/3: Worsening hydrocephalus on CT ->VP shunt placment 4/5 -> adjusted 4/8 to 1.0 with some  subjective improvement in ataxia on exam  - 4/10: CT head for worsening headaches, stable.  Treatment as below.  2.  Antithrombotics: -DVT/anticoagulation:  Pharmaceutical: Lovenox 40mg  QD -antiplatelet therapy: Aspirin 81 mg daily- currently held 08/09/22 d/t surgery, spoke with Dr. Conchita Paris 08/09/22, states it should restart in 1-2 days - > resume 4/9  3. Pain Management: Tylenol as needed, Diazepam 5mg  q8h PRN muscle spasms, Voltaren 1% 2g QID - 3/29: Schedule Tylenol 1000 mg TID, add gabapentin 300 mg QHS for headache and sleep -08/02/22 pt previously on Hydrocodone-APAP 5-325mg  at prior discharge, was still using this, would like to keep; reordered for now, 5-325mg  q6h  PRN x7 days, if needed longer then weekday team to discuss; adjusted tylenol to  TID to avoid overdosing of tylenol; would also prefer gabapentin be scheduled BID rather than PRN -08/03/22 - 4/1 improved control, cont regimen for now -08/09/22 Hydrocodone 5-325mg  q6h PRN readded since he had surgery again yesterday; also added morphine  q4h PRN in case he needs IV pain control postoperatively; monitor closely -08/11/22 pain ongoing, monitor and convert to PO regimen in AM. For HA, Increase gabapentin from 300 mg BID to 300/300/600 -Sleep and headache improved with gabapentin, monitor -4/10: Headache worse, diffuse this a.m.  CT head stable.  Patient did not tolerate discontinuation of as needed IV morphine.  Scheduled Norco every 6, added Topamax 25 mg twice daily, reduce morphine to 2 mg IV twice daily as needed. -4/10: Maintain Topamax 25 mg twice daily, Norco scheduled.  Increase gabapentin to 600 mg 3 times daily, increase as needed morphine to 3 mg IV twice daily.  Goal to wean off IV off by the end of the week. -4/12: Increase topomax to 50 mg BID. Weaning morphine PRN over the weekend to 2 mg BID PRN 4/13, 1 mg BID PRN 4/14, then DC.   -4/13-14/24 pain managed for now, monitor with weaning morphine 4. Mood/Behavior/Sleep:  Zoloft 50 mg daily             -antipsychotic agents: N/A   -3/29: Gabapentin as above, melatonin  QHS PRN  -08/02/22 scheduled Gabapentin BID and melatonin QHS per request  -08/03/22 greatly improved sleep - continue  -4/2: Added trazodone 50 mg PRN for ?insomnia; not endorsed by patient.  -4/3: Patient endorses ongoing insomnia, uncertain etiology.  As needed trazodone not utilized, will schedule starting tonight. - sleep stable -08/09/22 slept poorly but suspect could be from missing some meds yesterday and postop recovery; monitor for now -08/10/22 sleep gradually improving, monitor - 4/8: remains poor d.t HA, pain; tx as above - 4/9: Endorses worsening depression, more labile mood secondary to frustration regarding his diagnoses.  Agreeable to increasing antidepressant sertraline from 50 to 75 mg.  Could also consider switching to BuSpar given some studies showing benefit for cerebellar ataxia. - 4/10-13: Mood stable, monitor.  5. Neuropsych/cognition: This patient is capable of making decisions on his own behalf. 6. Skin/Wound Care: Routine skin checks 7. Dysphagia/Fluids/Electrolytes/Nutrition: Routine in and outs with follow-up chemistries  - 3/29: Cr stable, BUN increased, encourage PO fluids and re-test Monday--ordered weekly; stable CR, BUN improved  4/1; stable 4/3; stable 4/8 -08/02/22 takes Vitamin D 2000IU QD and Calcium supplement at home; reordered -08/10/22 pt dislikes diet order/purees and refuses to eat it, would prefer to go back to prior diet (bacon, eggs, mashed potatoes)-- not on SLP schedule today but spoke with speech therapy and asked if they could possibly do a quick eval to see if he can advance his diet today-- they will try to get to him today - 4.8: diet advanced to Dys 3; pt reports improved POS -4/10: Nutrition evaluated, meeting nutritional needs.  Supplements as needed.  Monitor -4/12: wet vocal quality and feel secretions interfering with POs; add loratidine 1 tab  daily for drying, if ineffective can consider scopalamine patch  8.  Hyperlipidemia.  Crestor  daily 9.  Rheumatoid arthritis.  Consider resuming home regimen of rituximab, methotrexate and sulfasalazine after steroids completed (4/10).  Currently on Dexamethasone with taper. -08/02/22 wife concerned with restarting of meds; advised to readdress this once steroids have tapered around 08/13/22 -4-10: Wife requesting  resumption of rituximab, methotrexate, sulfasalazine.  Theoretical risk of increased post-operative infection, however on lit review appears minimal. Will get CBC, CMP in AM to ensure WBC count decreases in setting of completed steroid taper, then resume. - 4-11: No signs or symptoms of infection, white count downtrending after steroid treatment completed.  Will touch base with rheum (Dr. Corliss Skains) about restarting regimen; no direct contrainidications identified.  -4/12: Per rheumatology, can restart all immunologics 2 weeks post-op (4/19)   10.  CAD.  Continue low-dose aspirin. Denies CP 11.  Dysphagia. Regular diet with nectar liquids. Follow up SLP 12.  Chronic systolic congestive heart failure.  Monitor for any signs of fluid overload. Daily weight. Currently HOLDING Spironolactone 12.5mg  QD (halved during last hospital stay), Lisinopril  QD, Metoprolol  QD  -3/30-4/5 wt stable, BP stable, continue to monitor -08/09/22 wt stable, BPs soft overnight but somewhat close to prior, improved this morning; monitor closely tonight, may need gentle hydration, hold off on labs for now -4/7-11/24 BPs stable monitor -4/11: Weights downtrending. BP stable. Monitor.  -4/12: mild hypotension today, asymptomatic. PO fluids poor d/t thickened liquids. BMP for AM. Encourage fludis -08/16/22 wt stable, BP still soft but stable, BMP unremarkable; monitor -08/17/22 wt/BP stable, cont to monitor Filed Weights   08/15/22 0500 08/16/22 0700 08/17/22 0600  Weight: 88.5 kg 89.4 kg 89.1 kg    Vitals:   08/14/22 0512 08/14/22 1243 08/14/22 1946 08/15/22 0515  BP: 129/77 122/79 (!) 99/55 (!) 84/50   08/15/22 0610 08/15/22 1305 08/15/22 2011 08/16/22 0545  BP: 110/68 102/78 119/84 (!) 93/54   08/16/22 1303 08/16/22 1957 08/17/22 0522 08/17/22 0557  BP: 97/68 115/62 (!) 84/60 107/71     13.  OSA. Intolerance to CPAP. Counseling on CPAP benefits.  14. Prediabetes. A1C 6.0. Monitor on CBC/CMP 15. Leukocytosis. Likely due to steroids. Stable. Monitor for s/s infection.    - 10.2->11->10.6 -> 12 4/3 ; monitor   - 4/8: 12.9 -> 11 4/11; downtrending  16. Macular degeneration: takes Preservision AREDS2 and Systane 0.6% eye drops at home -08/02/22 ordered hospital formulary alternatives- Lacrilube BID and Prosight multivitamin QD - 4/1-4/2: Wife concerned regarding use of alternative to systane; recommended bringing in from home  - 4/4: Pharmacy to assist in nonformulary order for home systane; ordered  17. R wrist bruising - stable, improving -08/02/22 wife concerned with bruises to R wrist, appear older/healing, no tenderness or crepitus/deformities, no swelling. Suspect minor pressure related bruise, doubt underlying bony injury, doubt need for xray; likely related to blood thinners; monitor for now  - 4/2: Mild HgB drop 10.6->9.8-> 10.1 -> 9.9 post-op  18.  Hydrocephalus s/p AVM resection and pseudomeningocele repair. Can remove staples 4/19.   - 4/3: Cognitive slowing, increased dysarthria and ataxia.  See above, CT head and labs today for workup.  No focal weakness or sensory changes, no gross cranial nerve deficits to indicate need for acute stroke workup.  Last known normal would have been at bedtime last night. - 4/4: CT head concerning for worsening hydrocephalus. See above.  - 4/5:  Dr. Alanson Puls and Dr. Jake Samples recommending VP shunt, family agreeable, plan to OR 4/5 and return to [facility] if no complications -08/09/22 VP shunt placed last night, doing well, some mild abd  discomfort but nondistended and no exquisite tenderness; pain control as above - 4.8: pending neurosurgery eval, per note may consider decreasing Strata II valve setting tomorrow to 1.0 or 0.5-adjusted to 1.0, with improvements - 4/10: CT head stable; Tx  headache as above. Discussed with wife that imaging may take some time post-op to improve.  - 4/12: If no significant improvement by Monday, will ask for Dr. Conchita Paris to re-evaluate shunt settings (currently on 1.0) -08/16/22 Dr. Conchita Paris following, appreciate assistance    LOS: 17 days A FACE TO [facility] EVALUATION WAS PERFORMED  [address] Atianna Haidar 08/17/2022, 11:50 AM

## 2022-08-18 ENCOUNTER — Inpatient Hospital Stay (HOSPITAL_COMMUNITY): Payer: BC Managed Care – PPO

## 2022-08-18 LAB — BASIC METABOLIC PANEL
Anion gap: 8 (ref 5–15)
BUN: 15 mg/dL (ref 8–23)
CO2: 27 mmol/L (ref 22–32)
Calcium: 8.7 mg/dL — ABNORMAL LOW (ref 8.9–10.3)
Chloride: 102 mmol/L (ref 98–111)
Creatinine, Ser: 1.06 mg/dL (ref 0.61–1.24)
GFR, Estimated: >60 mL/min (ref >60)
Glucose, Bld: 110 mg/dL — ABNORMAL HIGH (ref 70–99)
Potassium: 3.8 mmol/L (ref 3.5–5.1)
Sodium: 137 mmol/L (ref 135–145)

## 2022-08-18 LAB — CBC
HCT: 30.1 % — ABNORMAL LOW (ref 39.0–52.0)
Hemoglobin: 9.7 g/dL — ABNORMAL LOW (ref 13.0–17.0)
MCH: 23.7 pg — ABNORMAL LOW (ref 26.0–34.0)
MCHC: 32.2 g/dL (ref 30.0–36.0)
MCV: 73.6 fL — ABNORMAL LOW (ref 80.0–100.0)
Platelets: 191 10*3/uL (ref 150–400)
RBC: 4.09 MIL/uL — ABNORMAL LOW (ref 4.22–5.81)
RDW: 20.6 % — ABNORMAL HIGH (ref 11.5–15.5)
WBC: 9.3 10*3/uL (ref 4.0–10.5)
nRBC: 0 % (ref 0.0–0.2)

## 2022-08-18 MED ORDER — POLYETHYLENE GLYCOL 3350 17 G PO PACK: 17.0000 g | PACK | Freq: Every day | ORAL | Status: DC

## 2022-08-18 MED ORDER — SORBITOL 70 % SOLN
30.0000 mL | Freq: Every day | Status: DC | PRN
  Administered 2022-08-18: 30 mL via ORAL
  Filled 2022-08-18: qty 30

## 2022-08-18 NOTE — Progress Notes (Signed)
PROGRESS NOTE   Subjective/Complaints:   No events overnight.  Headache remains persistent, he has noticed an association with when he is laying back with his neck on the pillow.  When he is able to get his neck up, the pain is not as bad.  He has noticed no association with positional changes in the bed, laying flat versus sitting up.  Unchanged since this weekend.  Tolerated wean off of morphine well.   ROS: +dysarthria c/b wet vocal quality-improved  +HAs/postop head pain-ongoing, associated with pressure on his posterior neck +insomnia-improved,  +postop abd pain-resolved.  Denies fevers, chills, N/V, constipation, diarrhea, SOB, cough, chest pain, new weakness or paraesthesias.    Objective:   No results found. Recent Labs    08/18/22 0623  WBC 9.3  HGB 9.7*  HCT 30.1*  PLT 191    Recent Labs    08/16/22 0729 08/18/22 0623  NA 139 137  K 3.8 3.8  CL 102 102  CO2 28 27  GLUCOSE 100* 110*  BUN 20 15  CREATININE 0.98 1.06  CALCIUM 8.7* 8.7*     Intake/Output Summary (Last 24 hours) at 08/18/2022 1610 Last data filed at 08/18/2022 0558 Gross per 24 hour  Intake 236 ml  Output 525 ml  Net -289 ml         Physical Exam: Vital Signs Blood pressure (!) 94/59, pulse 64, temperature 98.7 F (37.1 C), resp. rate 17, height  (1.854 m), weight 88.8 kg, SpO2 97 %.    General: NAD, alert, sitting up in wheelchair working with PT HEENT:  R scalp  - shunt in place, staples intact.  PERRLA. Wet vocal quality - improved Neck: Supple without JVD Heart: Reg rate and rhythm. No murmurs rubs or gallops Chest: CTA bilaterally without wheezes, rales, or rhonchi; no distress.  Abdomen: Soft, nonTTP, non-distended, bowel sounds positive. Multiple laparoscopic incisions with glue, c/d/i Extremities: No clubbing, cyanosis, or edema.   Psych: Pt's affect is appropriate . Pt is cooperative.   Skin: Clean and intact  without signs of breakdown,  healed incision/scar to posterior neck, with fluctuant fluid -significantly decreased  + staples over shunt site c/d/I + Abdominal trochar sites well approximated with dermabond, c/d/I.  No tenderness to palpation  PRIOR EXAMS: Neuro: Alert and oriented x 4. + Right facial droop  + Speech apraxia and dysarthria -mildly decreased from last week Strength antigravity and against resistance in bilateral upper extremities and lower extremities Sensation intact light touch in all 4 extremities + Tender to palpation around the fluctuant fluid side and throughout the neck, bilateral temples, and forehead. -Slightly less tender to palpation  + FTN  altered R>L today -much improved on the right today! Musculoskeletal: Normal bulk and tone, no joint swelling noted.  Moving all 4 limbs antigravity and against resistance.    Assessment/Plan: 1. Functional deficits which require 3+ hours per day of interdisciplinary therapy in a comprehensive inpatient rehab setting. Physiatrist is providing close team supervision and 24 hour management of active medical problems listed below. Physiatrist and rehab team continue to assess barriers to discharge/monitor patient progress toward functional and medical goals  Care Tool:  Bathing  Body parts bathed by patient: Right arm, Left arm, Chest, Front perineal area, Abdomen, Buttocks, Left upper leg, Left lower leg, Face, Right lower leg, Right upper leg         Bathing assist Assist Level: Minimal Assistance - Patient > 75%     Upper Body Dressing/Undressing Upper body dressing   What is the patient wearing?: Pull over shirt    Upper body assist Assist Level: Contact Guard/Touching assist    Lower Body Dressing/Undressing Lower body dressing      What is the patient wearing?: Underwear/pull up, Pants     Lower body assist Assist for lower body dressing: Minimal Assistance - Patient > 75%     Toileting Toileting     Toileting assist Assist for toileting: Minimal Assistance - Patient > 75%     Transfers Chair/bed transfer  Transfers assist     Chair/bed transfer assist level: Contact Guard/Touching assist     Locomotion Ambulation   Ambulation assist      Assist level: Minimal Assistance - Patient > 75% Assistive device: Walker-rolling Max distance: 180   Walk 10 feet activity   Assist     Assist level: Minimal Assistance - Patient > 75% Assistive device: Walker-rolling   Walk 50 feet activity   Assist    Assist level: Minimal Assistance - Patient > 75% Assistive device: Walker-rolling    Walk 150 feet activity   Assist    Assist level: Minimal Assistance - Patient > 75% Assistive device: Walker-rolling    Walk 10 feet on uneven surface  activity   Assist     Assist level: Minimal Assistance - Patient > 75% Assistive device: Development worker, international aid     Assist Is the patient using a wheelchair?: Yes Type of Wheelchair: Manual    Wheelchair assist level: Minimal Assistance - Patient > 75% Max wheelchair distance: 150'    Wheelchair 50 feet with 2 turns activity    Assist        Assist Level: Minimal Assistance - Patient > 75%   Wheelchair 150 feet activity     Assist      Assist Level: Minimal Assistance - Patient > 75%   Blood pressure (!) 94/59, pulse 64, temperature 98.7 F (37.1 C), resp. rate 17, height 6\' 1"  (1.854 m), weight 88.8 kg, SpO2 97 %.   Medical Problem List and Plan: 1. Functional deficits secondary to cerebellar CVA, c/b recent pseudomeningocele repair and Hx AVM resection December 2023..     - 4/4: Note Dr. Alanson Puls stating patient likely did, in fact, have a cerebellar stroke and not just post-operative changes as previously noted. Changing primary Dx to cerebellar infarct.           -patient may shower             -ELOS/Goals: 7-10 days, Mod I PT/OT/SLP; DC goal extended to 4/23             -Admit  to CIR -Decadron as indicated 4 MG Q8 HOURS with TAPER STARTING 3/29 to 4/10 - 4/3: Worsening hydrocephalus on CT ->VP shunt placment 4/5 -> adjusted 4/8 to 1.0 with some subjective improvement in ataxia on exam  - 4/10: CT head for worsening headaches, stable.  Treatment as below.  2.  Antithrombotics: -DVT/anticoagulation:  Pharmaceutical: Lovenox 40mg  QD -antiplatelet therapy: Aspirin 81 mg daily- currently held 08/09/22 d/t surgery, spoke with Dr. Conchita Paris 08/09/22, states it should restart in 1-2 days - > resume 4/9  3.  Pain Management: Tylenol as needed, Diazepam 5mg  q8h PRN muscle spasms, Voltaren 1% 2g QID - 3/29: Schedule Tylenol 1000 mg TID, add gabapentin 300 mg QHS for headache and sleep -08/02/22 pt previously on Hydrocodone-APAP 5-325mg  at prior discharge, was still using this, would like to keep; reordered for now, 5-325mg  q6h PRN x7 days, if needed longer then weekday team to discuss; adjusted tylenol to 650mg  TID to avoid overdosing of tylenol; would also prefer gabapentin be scheduled BID rather than PRN -08/03/22 - 4/1 improved control, cont regimen for now -08/09/22 Hydrocodone 5-325mg  q6h PRN readded since he had surgery again yesterday; also added morphine 4mg  q4h PRN in case he needs IV pain control postoperatively; monitor closely -08/11/22 pain ongoing, monitor and convert to PO regimen in AM. For HA, Increase gabapentin from 300 mg BID to 300/300/600 -Sleep and headache improved with gabapentin, monitor -4/10: Headache worse, diffuse this a.m.  CT head stable.  Patient did not tolerate discontinuation of as needed IV morphine.  Scheduled Norco every 6, added Topamax 25 mg twice daily, reduce morphine to 2 mg IV twice daily as needed. -4/10: Maintain Topamax 25 mg twice daily, Norco scheduled.  Increase gabapentin to 600 mg 3 times daily, increase as needed morphine to 3 mg IV twice daily.  Goal to wean off IV off by the end of the week. -4/12: Increase topomax to 50 mg BID. Weaning  morphine PRN over the weekend to 2 mg BID PRN 4/13, 1 mg BID PRN 4/14, then DC.   -4/13-14/24 pain managed for now, monitor with weaning morphine   - 4/15: Weaned off of morphine starting today, continue scheduled pain regimen with plan to wean down later this week. ?  Association with pressure on pseudomeningocele in the back of the head.  May benefit from a cervical collar, however this could also increase the pressure make it worse.  Will look at repositioning options today.  4. Mood/Behavior/Sleep: Zoloft 50 mg daily             -antipsychotic agents: N/A   -3/29: Gabapentin as above, melatonin 5mg  QHS PRN  -08/02/22 scheduled Gabapentin BID and melatonin QHS per request  -08/03/22 greatly improved sleep - continue  -4/2: Added trazodone 50 mg PRN for ?insomnia; not endorsed by patient.  -4/3: Patient endorses ongoing insomnia, uncertain etiology.  As needed trazodone not utilized, will schedule starting tonight. - sleep stable -08/09/22 slept poorly but suspect could be from missing some meds yesterday and postop recovery; monitor for now -08/10/22 sleep gradually improving, monitor - 4/8: remains poor d.t HA, pain; tx as above - 4/9: Endorses worsening depression, more labile mood secondary to frustration regarding his diagnoses.  Agreeable to increasing antidepressant sertraline from 50 to 75 mg.  Could also consider switching to BuSpar given some studies showing benefit for cerebellar ataxia. - 4/10-13: Mood stable, monitor.  5. Neuropsych/cognition: This patient is capable of making decisions on his own behalf. 6. Skin/Wound Care: Routine skin checks 7. Dysphagia/Fluids/Electrolytes/Nutrition: Routine in and outs with follow-up chemistries  - 3/29: Cr stable, BUN increased, encourage PO fluids and re-test Monday--ordered weekly; stable CR, BUN improved  4/1; stable 4/3; stable 4/8 -08/02/22 takes Vitamin D 2000IU QD and Calcium supplement at home; reordered -08/10/22 pt dislikes diet  order/purees and refuses to eat it, would prefer to go back to prior diet (bacon, eggs, mashed potatoes)-- not on SLP schedule today but spoke with speech therapy and asked if they could possibly do a quick eval to see  if he can advance his diet today-- they will try to get to him today - 4.8: diet advanced to Dys 3; pt reports improved POS -4/10: Nutrition evaluated, meeting nutritional needs.  Supplements as needed.  Monitor -4/12: wet vocal quality and feel secretions interfering with POs; add loratidine 1 tab daily for drying -mildly improved, may remove later this week 4-15: Creatinine uptrending, although BUN remains stable and other electrolytes look good.  Encourage p.o. fluids.  Retest Thursday.  8.  Hyperlipidemia.  Crestor  daily 9.  Rheumatoid arthritis.  Consider resuming home regimen of rituximab, methotrexate and sulfasalazine after steroids completed (4/10).  Currently on Dexamethasone with taper. -08/02/22 wife concerned with restarting of meds; advised to readdress this once steroids have tapered around 08/13/22 -4-10: Wife requesting resumption of rituximab, methotrexate, sulfasalazine.  Theoretical risk of increased post-operative infection, however on lit review appears minimal. Will get CBC, CMP in AM to ensure WBC count decreases in setting of completed steroid taper, then resume. - 4-11: No signs or symptoms of infection, white count downtrending after steroid treatment completed.  Will touch base with rheum (Dr. Corliss Skains) about restarting regimen; no direct contrainidications identified.  -4/12: Per rheumatology, can restart all immunologics 2 weeks post-op (4/19)   10.  CAD.  Continue low-dose aspirin. Denies CP 11.  Dysphagia. Regular diet with nectar liquids. Follow up SLP 12.  Chronic systolic congestive heart failure.  Monitor for any signs of fluid overload. Daily weight. Currently HOLDING Spironolactone 12.5mg  QD (halved during last hospital stay), Lisinopril   QD, Metoprolol  QD  -3/30-4/5 wt stable, BP stable, continue to monitor -08/09/22 wt stable, BPs soft overnight but somewhat close to prior, improved this morning; monitor closely tonight, may need gentle hydration, hold off on labs for now -4/7-11/24 BPs stable monitor -4/11: Weights downtrending. BP stable. Monitor.  -4/12: mild hypotension today, asymptomatic. PO fluids poor d/t thickened liquids. BMP for AM. Encourage fludis -08/16/22 wt stable, BP still soft but stable, BMP unremarkable; monitor -08/17/22 wt/BP stable, cont to monitor  - 4/15: Intermittent hypotension, encourage p.o. fluids.  Patient endorses he is asymptomatic, requests DC IV today. Filed Weights   08/16/22 0700 08/17/22 0600 08/18/22 0500  Weight: 89.4 kg 89.1 kg 88.8 kg   Vitals:   08/14/22 1946 08/15/22 0515 08/15/22 0610 08/15/22 1305  BP: (!) 99/55 (!) 84/50 110/68 102/78   08/15/22 2011 08/16/22 0545 08/16/22 1303 08/16/22 1957  BP: 119/84 (!) 93/54 97/68 115/62   08/17/22 0522 08/17/22 0557 08/17/22 2014 08/18/22 0439  BP: (!) 84/60 107/71 114/84 (!) 94/59     13.  OSA. Intolerance to CPAP. Counseling on CPAP benefits.  14. Prediabetes. A1C 6.0. Monitor on CBC/CMP 15. Leukocytosis. Likely due to steroids. Stable. Monitor for s/s infection.    - 10.2->11->10.6 -> 12 4/3 ; monitor   - 4/8: 12.9 -> 11 4/11-.  9.3 4-15, normal  16. Macular degeneration: takes Preservision AREDS2 and Systane 0.6% eye drops at home -08/02/22 ordered hospital formulary alternatives- Lacrilube BID and Prosight multivitamin QD - 4/1-4/2: Wife concerned regarding use of alternative to systane; recommended bringing in from home  - 4/4: Pharmacy to assist in nonformulary order for home systane; ordered  17. R wrist bruising - stable, improving -08/02/22 wife concerned with bruises to R wrist, appear older/healing, no tenderness or crepitus/deformities, no swelling. Suspect minor pressure related bruise, doubt underlying bony injury,  doubt need for xray; likely related to blood thinners; monitor for now  - 4/2: Mild HgB drop  10.6->9.8-> 10.1 -> 9.9 post-op  18.  Hydrocephalus s/p AVM resection and pseudomeningocele repair. Can remove staples 4/19.   - 4/3: Cognitive slowing, increased dysarthria and ataxia.  See above, CT head and labs today for workup.  No focal weakness or sensory changes, no gross cranial nerve deficits to indicate need for acute stroke workup.  Last known normal would have been at bedtime last night. - 4/4: CT head concerning for worsening hydrocephalus. See above.  - 4/5:  Dr. Alanson Puls and Dr. Jake Samples recommending VP shunt, family agreeable, plan to OR 4/5 and return to IPR if no complications -08/09/22 VP shunt placed last night, doing well, some mild abd discomfort but nondistended and no exquisite tenderness; pain control as above - 4.8: pending neurosurgery eval, per note may consider decreasing Strata II valve setting tomorrow to 1.0 or 0.5-adjusted to 1.0, with improvements - 4/10: CT head stable; Tx headache as above. Discussed with wife that imaging may take some time post-op to improve.  - 4/12: If no significant improvement by Monday, will ask for Dr. Conchita Paris to re-evaluate shunt settings (currently on 1.0) -4/15 -significant improvement in ataxia in right upper extremity and voice today.  Pseudomeningocele appears to be decreasing, continue to monitor.    LOS: 18 days A FACE TO FACE EVALUATION WAS PERFORMED  Angelina Sheriff 08/18/2022, 9:22 AM

## 2022-08-18 NOTE — Progress Notes (Signed)
Occupational Therapy Note  Patient Details  Name: Christopher Yu MRN: [id number] Date of Birth: 06/15/1960  Today's Date: 08/18/2022 OT Missed Time: 45 Minutes Missed Time Reason: Patient fatigue Pt greeted semi-reclined in bed asleep. Pt able to wake but declined participating in therapy despite encouragement due to fatigue. OT to follow up per plan of care.   [name] 08/18/2022, 12:51 PM

## 2022-08-18 NOTE — Progress Notes (Signed)
Occupational Therapy Session Note  Patient Details  Name: Christopher Yu MRN: [id number] Date of Birth: 1960-07-10  Today's Date: 08/18/2022 OT Individual Time: 8099-8338 OT Individual Time Calculation (min): 57 min    Short Term Goals: Week 3:  OT Short Term Goal 1 (Week 3): Patient will demonstrate increased snmoothness and accuracy by slowing movements down while bathing with a wash cloth OT Short Term Goal 2 (Week 3): Patient will maintain dynamic standing balance without a device when reaching outside base of support and min A OT Short Term Goal 3 (Week 3): Patient will don and doff toothpaste lid without dropping it on 2/3 trials  Skilled Therapeutic Interventions/Progress Updates:    Pt greeted sitting upright in bed eating breakfast with nurse tech providing supervision. Pt agreeable to sit up in wc to finish breakfast and improve overall positioning for self-feeding. Pt completed bed mobility w/ HOB elevated and supervision. Pt completed stand-pivot to wc without device and min A. Improved balance and body awareness with stand-pivot. Pt also demonstrated improved hand eye coordination and fine motor control with feeding utensil and bringing food to mouth. Pt with some coughing when consuming nectar thick liquids requiring cues to slow down and not "chug" beverage. Pt then agreeable to shower. OT educated on RW positioning to access dresser drawers to collect clothing. Min/CGA for balance when reaching outside base of support into drawers to get clothes. Functional ambulation into bathroom w/ RW and CGA. Pt voided bladder and doffed clothing seated on toilet. Bathing completed from tub bench in shower with cues to slow down movements, but much improved coordination overall today. Pt did not drop the wash cloth once! Dressing tasks sit<>stand from wc with CGA for balance when standing without device to pull up pants. CGA to stand-pivot back to bed with cues for technique. Pt left  semi-reclined In bed with bed alarm on, call bell in reach, and needs met.   Therapy Documentation Precautions:  Precautions Precautions: Fall Restrictions Weight Bearing Restrictions: No Other Position/Activity Restrictions: Ataxia, impulsive, impaired problem-solving General:   Vital Signs: Therapy Vitals Temp: 98.7 F (37.1 C) Pulse Rate: 64 Resp: 17 BP: (!) 94/59 Patient Position (if appropriate): Lying Oxygen Therapy SpO2: 97 % O2 Device: Room Air Pain:   ADL: ADL Eating: Set up, Supervision/safety Grooming: Minimal assistance Upper Body Bathing: Minimal assistance Lower Body Bathing: Minimal assistance Upper Body Dressing: Minimal assistance Lower Body Dressing: Moderate assistance Toileting: Minimal assistance Toilet Transfer: Minimal assistance Film/video editor: Minimal assistance ADL Comments: Has RW, has raised seat with handles over commode Vision   Perception    Praxis   Balance   Exercises:   Other Treatments:     Therapy/Group: Individual Therapy  Mal Amabile 08/18/2022, 7:54 AM

## 2022-08-18 NOTE — Progress Notes (Addendum)
Modified Barium Swallow Study  Patient Details  Name: Christopher Yu MRN: [id number] Date of Birth: 06-30-1960  Today's Date: 08/18/2022  HPI/PMH: HPI: 62 y.o. male presents to [facility] hospital on 07/28/2022 with slurred speech, worsening gait and HA. Pt recently discharged 07/25/2022 after operative repair of pseudomeningiocele. MRI brain 3/25 demonstrates acute R cerebellar infarct. PMH: for CAD, R TKA, cardiomyopathy, chronic systolic heart failure, macular degeneration. Repeat MBS for possible progression of liquids.   Clinical Impression: Clinical Impression: Pt demonstrarted similar oropharyngeal swallow function from prior MBS. Continues with minimal oral dysphagia marked by trace lingual residue otherwise, manipulation, mastication and propulsion was functional. Pharyngeal phase marked by decreased laryngeal elevation, laryngeal closure resulting in silent aspiration with thin liquids. A left head turn reduced to penetration in all but one episode where he silently aspirated. A cue for hard cough was effective in ejecting aspirates/penetrates. Chin tuck and breath hold was not effective. In one instance penetration occured before complete closure and a breath hold strategy was not effective. Puree was penetrated x 1 cue cue for throat clear needed. Reduced tongue base retraction led to mild vallecular and pyriform sinus residue that decreases with second swallow. It is recommended that he continue wtih regular texture and nectar liquids with SLP working with pt on thin liquids with left head turn and immediate volitional strong cough for consistency with strategy and upgrade to thin liquids at bedside when appropriate.  Factors that may increase risk of adverse event in presence of aspiration Rubye Oaks & Clearance Coots 2021): No data recorded  Recommendations/Plan: Swallowing Evaluation Recommendations Swallowing Evaluation Recommendations Recommendations: PO diet PO Diet Recommendation:  Regular; Mildly thick liquids (Level 2, nectar thick) Liquid Administration via: Cup Medication Administration: Crushed with puree Supervision: Staff to assist with self-feeding; Full supervision/cueing for swallowing strategies Swallowing strategies  : Head turn left during swallowing Postural changes: Position pt fully upright for meals Oral care recommendations: Oral care BID (2x/day)    Treatment Plan Treatment Plan Treatment recommendations: Therapy as outlined in treatment plan below Follow-up recommendations: Acute inpatient rehab (3 hours/day) Functional status assessment: Patient has had a recent decline in their functional status and demonstrates the ability to make significant improvements in function in a reasonable and predictable amount of time. Treatment frequency: Min 2x/week Treatment duration: 2 weeks Interventions: Aspiration precaution training; Patient/family education; Compensatory techniques; Trials of upgraded texture/liquids     Recommendations Recommendations for follow up therapy are one component of a multi-disciplinary discharge planning process, led by the attending physician.  Recommendations may be updated based on patient status, additional functional criteria and insurance authorization.  Assessment: Orofacial Exam: Orofacial Exam Oral Cavity: Oral Hygiene: WFL Oral Cavity - Dentition: Adequate natural dentition Orofacial Anatomy: Other (comment) Oral Motor/Sensory Function: Suspected cranial nerve impairment CN V - Trigeminal: WFL CN VII - Facial: Left motor impairment CN IX - Glossopharyngeal, CN X - Vagus: Not tested    Anatomy:  Anatomy: WFL   Boluses Administered: Boluses Administered Boluses Administered: Thin liquids (Level 0); Mildly thick liquids (Level 2, nectar thick); Puree; Solid     Oral Impairment Domain: Oral Impairment Domain Lip Closure: No labial escape Tongue control during bolus hold: Cohesive bolus between  tongue to palatal seal Bolus preparation/mastication: Timely and efficient chewing and mashing Bolus transport/lingual motion: Brisk tongue motion Oral residue: Trace residue lining oral structures Location of oral residue : Tongue Initiation of pharyngeal swallow : Pyriform sinuses     Pharyngeal Impairment Domain: Pharyngeal Impairment Domain Soft palate elevation: No  bolus between soft palate (SP)/pharyngeal wall (PW) Laryngeal elevation: Partial superior movement of thyroid cartilage/partial approximation of arytenoids to epiglottic petiole Anterior hyoid excursion: Partial anterior movement Epiglottic movement: Partial inversion Laryngeal vestibule closure: Incomplete, narrow column air/contrast in laryngeal vestibule Pharyngeal stripping wave : Present - complete Pharyngeal contraction (A/P view only): N/A Pharyngoesophageal segment opening: Complete distension and complete duration, no obstruction of flow Tongue base retraction: Trace column of contrast or air between tongue base and PPW Pharyngeal residue: Collection of residue within or on pharyngeal structures Location of pharyngeal residue: Valleculae; Pyriform sinuses     Esophageal Impairment Domain: Esophageal Impairment Domain Esophageal clearance upright position: Complete clearance, esophageal coating    Pill: Esophageal Impairment Domain Esophageal clearance upright position: Complete clearance, esophageal coating    Penetration/Aspiration Scale Score: Penetration/Aspiration Scale Score 2.  Material enters airway, remains ABOVE vocal cords then ejected out: Thin liquids (Level 0) 3.  Material enters airway, remains ABOVE vocal cords and not ejected out: Puree; Mildly thick liquids (Level 2, nectar thick) 4.  Material enters airway, CONTACTS cords then ejected out: Mildly thick liquids (Level 2, nectar thick) 8.  Material enters airway, passes BELOW cords without attempt by patient to eject out (silent  aspiration) : Thin liquids (Level 0)    Compensatory Strategies: Compensatory Strategies Compensatory strategies: Yes Multiple swallows: Effective Chin tuck: Ineffective Ineffective Chin Tuck: Thin liquid (Level 0); Mildly thick liquid (Level 2, nectar thick) Left head turn: Ineffective Effective Left Head Turn: Mildly thick liquid (Level 2, nectar thick) Ineffective Left Head Turn: Thin liquid (Level 0) Supraglottic swallow: Ineffective Ineffective Supraglottic Swallow: Thin liquid (Level 0)       General Information: Caregiver present: No   Diet Prior to this Study: Regular; Mildly thick liquids (Level 2, nectar thick)    Temperature : Normal    Respiratory Status: WFL    Supplemental O2: None (Room air)    History of Recent Intubation: No   Behavior/Cognition: Alert; Cooperative; Pleasant mood  Self-Feeding Abilities: Able to self-feed  Baseline vocal quality/speech: -- (dysarthric)  Volitional Cough: Able to elicit  Volitional Swallow: Able to elicit  No data recorded  Goal Planning: Prognosis for improved oropharyngeal function: Good  No data recorded No data recorded Patient/Family Stated Goal: -- (improve speech and "volume")  Consulted and agree with results and recommendations: Patient; Family member/caregiver   Pain: Pain Assessment Pain Assessment: No/denies pain Pain Score: 5 Faces Pain Scale: 4 Pain Location: Headache Pain Descriptors / Indicators: Grimacing Pain Intervention(s): Monitored during session    End of Session: Start Time:SLP Start Time (ACUTE ONLY): 7829  Stop Time: SLP Stop Time (ACUTE ONLY): 0946  Time Calculation:SLP Time Calculation (min) (ACUTE ONLY): 17 min  Charges: SLP Evaluations $ SLP Speech Visit: 1 Visit  SLP Evaluations $BSS Swallow: 1 Procedure $MBS Swallow: 1 Procedure $ SLP EVAL LANGUAGE/SOUND PRODUCTION: 1 Procedure $Swallowing Treatment: 1 Procedure   SLP visit diagnosis: SLP Visit Diagnosis:  Dysphagia, oropharyngeal phase (R13.12)    Past Medical History:  Past Medical History:  Diagnosis Date   Anxiety    Arthritis, rheumatoid    dx 1988   CAD (coronary artery disease), native coronary artery    Mild LAD disease with calcification noted at cath 12//27/16    Cardiomyopathy    Cardiomyopathy    Chronic systolic heart failure 05/01/2015   Depression    Deviated nasal septum 06/25/2011   Headache    High cholesterol    Hyperlipidemia    Impingement syndrome of  left shoulder 12/22/2017   Macular degeneration    Rheumatoid aortitis    Sleep apnea    does not use cpap - UNABLE TO TOLERATE MASK   Sleep apnea in adult    deviated septum repaired, most recent sleep study was negative   Status post total right knee replacement 06/01/2015   Past Surgical History:  Past Surgical History:  Procedure Laterality Date   ANKLE FUSION Right 05/05/2012   related to his arthritis   ANKLE FUSION Left 05/25/2013   related to his arthritis   ANKLE SURGERY Bilateral    APPLICATION OF CRANIAL NAVIGATION N/A 04/11/2022   Procedure: APPLICATION OF CRANIAL NAVIGATION;  Surgeon: Lisbeth Renshaw, MD;  Location: [facility] OR;  Service: Neurosurgery;  Laterality: N/A;   APPLICATION OF CRANIAL NAVIGATION N/A 08/08/2022   Procedure: APPLICATION OF CRANIAL NAVIGATION;  Surgeon: Lisbeth Renshaw, MD;  Location: [facility] OR;  Service: Neurosurgery;  Laterality: N/A;   CARDIAC CATHETERIZATION N/A 05/01/2015   Procedure: Left Heart Cath and Coronary Angiography;  Surgeon: Lyn Records, MD;  Location: [facility] INVASIVE CV LAB;  Service: Cardiovascular;  Laterality: N/A;   CRANIOTOMY N/A 04/11/2022   Procedure: STEREOTACTIC SUBOCCIPITAL CRANIOTOMY FOR RESECTION OF ARTERIO-VENOUS MALFORMATION;  Surgeon: Lisbeth Renshaw, MD;  Location: [facility] OR;  Service: Neurosurgery;  Laterality: N/A;   FRACTURE SURGERY     left femur fracture x 3   IR ANGIO EXTERNAL CAROTID SEL EXT CAROTID BILAT MOD SED  01/28/2022   IR ANGIO  INTRA EXTRACRAN SEL INTERNAL CAROTID BILAT MOD SED  01/28/2022   IR ANGIO INTRA EXTRACRAN SEL INTERNAL CAROTID BILAT MOD SED  04/16/2022   IR ANGIO VERTEBRAL SEL VERTEBRAL BILAT MOD SED  01/28/2022   IR ANGIO VERTEBRAL SEL VERTEBRAL BILAT MOD SED  04/16/2022   KNEE ARTHROSCOPY     right x 4   LAPAROSCOPIC REVISION VENTRICULAR-PERITONEAL (V-P) SHUNT N/A 08/08/2022   Procedure: LAPAROSCOPIC VENTRICULAR-PERITONEAL (V-P) SHUNT;  Surgeon: Harriette Bouillon, MD;  Location: [facility] OR;  Service: General;  Laterality: N/A;   LUMBAR LAMINECTOMY/DECOMPRESSION MICRODISCECTOMY N/A 07/18/2022   Procedure: Repair of Pseudomeningiocele posterior;  Surgeon: Lisbeth Renshaw, MD;  Location: [facility] OR;  Service: Neurosurgery;  Laterality: N/A;   NASAL SEPTOPLASTY W/ TURBINOPLASTY  06/25/2011   Procedure: NASAL SEPTOPLASTY WITH TURBINATE REDUCTION;  Surgeon: Osborn Coho, MD;  Location: [facility] OR;  Service: ENT;  Laterality: Bilateral;   PLACEMENT OF LUMBAR DRAIN N/A 07/18/2022   Procedure: PLACEMENT OF LUMBAR DRAIN;  Surgeon: Lisbeth Renshaw, MD;  Location: [facility] OR;  Service: Neurosurgery;  Laterality: N/A;   TONSILLECTOMY     TOTAL KNEE ARTHROPLASTY Right 06/01/2015   Procedure: RIGHT TOTAL KNEE ARTHROPLASTY;  Surgeon: Kathryne Hitch, MD;  Location: [facility] ORS;  Service: Orthopedics;  Laterality: Right;  Block+general   VENTRICULOPERITONEAL SHUNT Right 08/08/2022   Procedure: LAP ASSITED SHUNT INSERTION VENTRICULAR-PERITONEAL;  Surgeon: Lisbeth Renshaw, MD;  Location: [facility] OR;  Service: Neurosurgery;  Laterality: Right;    Royce Macadamia 08/18/2022, 11:00 AM

## 2022-08-18 NOTE — Progress Notes (Signed)
Physical Therapy Session Note  Patient Details  Name: Christopher Yu MRN: [id number] Date of Birth: 27-Jun-1960  Today's Date: 08/18/2022 PT Individual Time: 1421-1520 PT Individual Time Calculation (min): 59 min  and Today's Date: 08/18/2022 PT Missed Time: 15 Minutes Missed Time Reason: Patient fatigue;Other (Comment) (headache, dizziness)   Short Term Goals: Week 3:  PT Short Term Goal 1 (Week 3): STGs=LTGs secondary to ELOS  Skilled Therapeutic Interventions/Progress Updates:    Gait trianing with RW pt ambulated loop from day room around RN station, ~150' with CGA and cues for dorsiflexion. As pt fatigued knees flexion increased and pt leaning more heavily on RW. Pt required CGA with min assist intermittently to manage walker position. Pt sat EOM in dayroom at end of ambulation and standing balance activities completed. Pt completed corn hole toss 2x through bean bag bin with bil UE. Pt more accurate with Rt than Lt. Seated rest breaks provided between each set. Pt reported the activity was "hard" 10/10 for challenge. Pt performed hockey/gold simulation with hockey stick first and then golf club. Attempted 1x each to hit yellow weighted ball towards goal, pt had LOB requiring Mod assist to prevent LOB anteriorly while attempting to use hockey stick. After attempt with golf club pt reported dizzy sensation and request to return to room. Stand pivot transfer completed EOM>WC with Min assist and pt dependently transported to room and completed stand pivot WC>bed. Pt returned to supine with supervision and BP assessed and stable.  Vitals:   08/18/22 1508 (immediately supine) 08/18/22 1511 (3 mins supine - symptoms improved)  BP: 113/85 116/83  Pulse: (!) 104 97    Pt remained in bed and wife arrived at EOS, discussed concerns about his ongoing headache and asking to speak to MD to better understand when to be concerned about headache/symptoms now that he has his shunt. Notified  physician of pt/family questions. Eos pt remained in bed, call bell within reach, and alarm on.    Therapy Documentation Precautions:  Precautions Precautions: Fall Restrictions Weight Bearing Restrictions: No Other Position/Activity Restrictions: Ataxia, impulsive, impaired problem-solving  Pain:  Pt reported headache throughout session 7/10. During balance activities headache worsened and pt reported dizziness/spinning sensation as well. Seated rest provided and pt returned to room and to bed. Symptoms resolved and headache back to 7-7.5/10 at EOS in supine.    Therapy/Group: Individual Therapy    Wynn Maudlin, DPT Acute Rehabilitation Services Office [phone]  08/18/22 3:41 PM

## 2022-08-19 MED ORDER — OXYCODONE HCL 5 MG PO TABS
5.0000 mg | ORAL_TABLET | Freq: Two times a day (BID) | ORAL | Status: DC | PRN
  Administered 2022-08-20 – 2022-08-26 (×11): 5 mg via ORAL
  Filled 2022-08-19 (×11): qty 1

## 2022-08-19 MED ORDER — HYDROCODONE-ACETAMINOPHEN 5-325 MG PO TABS
1.0000 | ORAL_TABLET | Freq: Four times a day (QID) | ORAL | Status: DC
  Administered 2022-08-19 – 2022-08-26 (×27): 1 via ORAL
  Filled 2022-08-19 (×27): qty 1

## 2022-08-19 MED ORDER — PREGABALIN 75 MG PO CAPS
200.0000 mg | ORAL_CAPSULE | Freq: Two times a day (BID) | ORAL | Status: DC
  Administered 2022-08-19 – 2022-08-26 (×14): 200 mg via ORAL
  Filled 2022-08-19 (×14): qty 1

## 2022-08-19 NOTE — Progress Notes (Signed)
Physical Therapy Session Note  Patient Details  Name: Christopher Yu MRN: [id number] Date of Birth: 1960/11/19  Today's Date: 08/19/2022 PT Individual Time: 1015-1100 PT Individual Time Calculation (min): 45 min   Short Term Goals: Week 3:  PT Short Term Goal 1 (Week 3): STGs=LTGs secondary to ELOS  Skilled Therapeutic Interventions/Progress Updates:    Pt presents in room in bed, agreeable to PT. Pt reports headache pain is manageable at this time however does ramp up throughout the day and states pain medication is not helpful. Pt completes bed mobility with supervision and all transfers with CGA with and without RW during session. Session focused on gait training and pregait training to promote improved postural stability for multidirectional stepping, device management, and single limb stability to achieve functional goals.   Pt requests to don sweatshirt, pants, and shoes, completed with distant supervision at EOB. Pt stands to finish pulling pants up over hips with CGA for standing balance without UE support. Pt ambulates from room to day room 150 with CGA with weighted RW, min assist occasionally with turns.  Pt then completes forward/backward walking 3x10' with RW CGA/min assist verbal cues for technique, sequencing, and RW management with pt demonstrating understanding.  Pt ambulates 60' without device with min assist, one instance of mod assist with turns demonstrating significant improved in anterior/posterior stability with COG within BOS.  Pt then completes multidirectional stepping practice without device including forward/backward walking 3x10' no device CGA/min assist, side stepping R/L 4x10' CGA/min assist.  Pt completes step tap to 4" step CGA x20 alternating BLE no UE support.  Pt completes quarter turns x5 to R and x5 to L to promote improved stability with turns, pt completes with CGA.  Pt ambulates back to room with RW CGA increasing speed once in hallway  however no instability noted.  Pt provided with seated rest breaks between gait trials and pregait activities to promote energy conservation and quality with tasks.  Pt returns to supine in bed with supervision, pt remains supine with all needs within reach, call light in place, and bed alarm activated at end of session.  Therapy Documentation Precautions:  Precautions Precautions: Fall Restrictions Weight Bearing Restrictions: No Other Position/Activity Restrictions: Ataxia, impulsive, impaired problem-solving    Therapy/Group: Individual Therapy  Edwin Cap PT, DPT 08/19/2022, 11:27 AM

## 2022-08-19 NOTE — Progress Notes (Signed)
PROGRESS NOTE   Subjective/Complaints:   No events overnight.   Patient complaining of persistent headache, feels it is somewhat worse today.  Has not noticed any improvement with use of pillow to offload his neck, the back of his head.  Remains bifrontal, worse with movement.  He is asking also if his pain medication can be adjusted or retimed so that he has not woken up in the middle the night to be medicated, although he does want it covered overnight so that he can sleep.  Also complaining of inability to adequately consume fluids due to need for supervision with consumption.  He states that, if he does not finish fluids quickly enough, the nurse to leave the room with them preventing his  drinking during the day.   Talk for extended time about his mood, notable irritability with therapies since his VP shunt.  He states that he does not think that he is going to get better, and is extremely frustrated with his current deficits.  No current thoughts of SI, HI.  Discussed prolonged recovery time of stroke and encouraged with that he is making visible gains daily.  ROS:  + Adjustment difficulty with depressed mood -ongoing  +dysarthria c/b wet vocal quality-improved  +HAs/postop head pain-ongoing, persistent, somewhat worse +insomnia-improved, associated with pain and timing of medications +postop abd pain-resolved.  Denies fevers, chills, N/V, constipation, diarrhea, SOB, cough, chest pain, new weakness or paraesthesias.    Objective:   DG Swallowing Func-Speech Pathology  Result Date: 08/18/2022 Table formatting from the original result was not included. Modified Barium Swallow Study Patient Details Name: Christopher Yu MRN: [id number] Date of Birth: Apr 06, 1961 Today's Date: 08/18/2022 HPI/PMH: HPI: 62 y.o. male presents to [facility] hospital on 07/28/2022 with slurred speech, worsening gait and HA. Pt recently discharged  07/25/2022 after operative repair of pseudomeningiocele. MRI brain 3/25 demonstrates acute R cerebellar infarct. PMH: for CAD, R TKA, cardiomyopathy, chronic systolic heart failure, macular degeneration. Repeat MBS for possible progression of liquids. Clinical Impression: Clinical Impression: Pt demonstrarted similar oropharyngeal swallow function from prior MBS. Continues with minimal oral dysphagia marked by trace lingual residue otherwise, manipulation, mastication and propulsion was functional. Pharyngeal phase marked by decreased laryngeal elevation, laryngeal closure resulting in silent aspiration with thin liquids. A left head turn reduced to penetration in all but one episode where he silently aspirated. A cue for hard cough was effective in ejecting aspirates/penetrates. Chin tuck and breath hold was not effective. In one instance penetration occured before complete closure and a breath hold strategy was not effective. Puree was penetrated x 1 cue cue for throat clear needed. Reduced tongue base retraction led to mild vallecular and pyriform sinus residue that decreases with second swallow. It is recommended that he continue wtih regular texture and nectar liquids with SLP working with pt on thin liquids with left head turn and immediate volitional strong cough for consistency with strategy and upgrade to thin liquids at bedside when appropriate. Factors that may increase risk of adverse event in presence of aspiration Rubye Oaks & Clearance Coots 2021): No data recorded Recommendations/Plan: Swallowing Evaluation Recommendations Swallowing Evaluation Recommendations Recommendations: PO diet PO Diet Recommendation: Regular; Mildly thick liquids (Level  2, nectar thick) Liquid Administration via: Cup Medication Administration: Crushed with puree Supervision: Staff to assist with self-feeding; Full supervision/cueing for swallowing strategies Swallowing strategies  : Head turn left during swallowing Postural changes:  Position pt fully upright for meals Oral care recommendations: Oral care BID (2x/day) Treatment Plan Treatment Plan Treatment recommendations: Therapy as outlined in treatment plan below Follow-up recommendations: Acute inpatient rehab (3 hours/day) Functional status assessment: Patient has had a recent decline in their functional status and demonstrates the ability to make significant improvements in function in a reasonable and predictable amount of time. Treatment frequency: Min 2x/week Treatment duration: 2 weeks Interventions: Aspiration precaution training; Patient/family education; Compensatory techniques; Trials of upgraded texture/liquids Recommendations Recommendations for follow up therapy are one component of a multi-disciplinary discharge planning process, led by the attending physician.  Recommendations may be updated based on patient status, additional functional criteria and insurance authorization. Assessment: Orofacial Exam: Orofacial Exam Oral Cavity: Oral Hygiene: WFL Oral Cavity - Dentition: Adequate natural dentition Orofacial Anatomy: Other (comment) Oral Motor/Sensory Function: Suspected cranial nerve impairment CN V - Trigeminal: WFL CN VII - Facial: Left motor impairment CN IX - Glossopharyngeal, CN X - Vagus: Not tested Anatomy: Anatomy: WFL Boluses Administered: Boluses Administered Boluses Administered: Thin liquids (Level 0); Mildly thick liquids (Level 2, nectar thick); Puree; Solid  Oral Impairment Domain: Oral Impairment Domain Lip Closure: No labial escape Tongue control during bolus hold: Cohesive bolus between tongue to palatal seal Bolus preparation/mastication: Timely and efficient chewing and mashing Bolus transport/lingual motion: Brisk tongue motion Oral residue: Trace residue lining oral structures Location of oral residue : Tongue Initiation of pharyngeal swallow : Pyriform sinuses  Pharyngeal Impairment Domain: Pharyngeal Impairment Domain Soft palate elevation: No bolus  between soft palate (SP)/pharyngeal wall (PW) Laryngeal elevation: Partial superior movement of thyroid cartilage/partial approximation of arytenoids to epiglottic petiole Anterior hyoid excursion: Partial anterior movement Epiglottic movement: Partial inversion Laryngeal vestibule closure: Incomplete, narrow column air/contrast in laryngeal vestibule Pharyngeal stripping wave : Present - complete Pharyngeal contraction (A/P view only): N/A Pharyngoesophageal segment opening: Complete distension and complete duration, no obstruction of flow Tongue base retraction: Trace column of contrast or air between tongue base and PPW Pharyngeal residue: Collection of residue within or on pharyngeal structures Location of pharyngeal residue: Valleculae; Pyriform sinuses  Esophageal Impairment Domain: Esophageal Impairment Domain Esophageal clearance upright position: Complete clearance, esophageal coating Pill: Esophageal Impairment Domain Esophageal clearance upright position: Complete clearance, esophageal coating Penetration/Aspiration Scale Score: Penetration/Aspiration Scale Score 2.  Material enters airway, remains ABOVE vocal cords then ejected out: Thin liquids (Level 0) 3.  Material enters airway, remains ABOVE vocal cords and not ejected out: Puree; Mildly thick liquids (Level 2, nectar thick) 4.  Material enters airway, CONTACTS cords then ejected out: Mildly thick liquids (Level 2, nectar thick) 8.  Material enters airway, passes BELOW cords without attempt by patient to eject out (silent aspiration) : Thin liquids (Level 0) Compensatory Strategies: Compensatory Strategies Compensatory strategies: Yes Multiple swallows: Effective Chin tuck: Ineffective Ineffective Chin Tuck: Thin liquid (Level 0); Mildly thick liquid (Level 2, nectar thick) Left head turn: Ineffective Effective Left Head Turn: Mildly thick liquid (Level 2, nectar thick) Ineffective Left Head Turn: Thin liquid (Level 0) Supraglottic swallow:  Ineffective Ineffective Supraglottic Swallow: Thin liquid (Level 0)   General Information: Caregiver present: No  Diet Prior to this Study: Regular; Mildly thick liquids (Level 2, nectar thick)   Temperature : Normal   Respiratory Status: WFL   Supplemental O2: None (Room  air)   History of Recent Intubation: No  Behavior/Cognition: Alert; Cooperative; Pleasant mood Self-Feeding Abilities: Able to self-feed Baseline vocal quality/speech: -- (dysarthric) Volitional Cough: Able to elicit Volitional Swallow: Able to elicit No data recorded Goal Planning: Prognosis for improved oropharyngeal function: Good No data recorded No data recorded Patient/Family Stated Goal: -- (improve speech and "volume") Consulted and agree with results and recommendations: Patient; Family member/caregiver Pain: Pain Assessment Pain Assessment: No/denies pain Pain Score: 5 Faces Pain Scale: 4 Pain Location: Headache Pain Descriptors / Indicators: Grimacing Pain Intervention(s): Monitored during session End of Session: Start Time:SLP Start Time (ACUTE ONLY): 0929 Stop Time: SLP Stop Time (ACUTE ONLY): 0946 Time Calculation:SLP Time Calculation (min) (ACUTE ONLY): 17 min Charges: SLP Evaluations $ SLP Speech Visit: 1 Visit SLP Evaluations $BSS Swallow: 1 Procedure $MBS Swallow: 1 Procedure $ SLP EVAL LANGUAGE/SOUND PRODUCTION: 1 Procedure $Swallowing Treatment: 1 Procedure SLP visit diagnosis: SLP Visit Diagnosis: Dysphagia, oropharyngeal phase (R13.12) Past Medical History: Past Medical History: Diagnosis Date  Anxiety   Arthritis, rheumatoid   dx 1988  CAD (coronary artery disease), native coronary artery   Mild LAD disease with calcification noted at cath 12//27/16   Cardiomyopathy   Cardiomyopathy   Chronic systolic heart failure 05/01/2015  Depression   Deviated nasal septum 06/25/2011  Headache   High cholesterol   Hyperlipidemia   Impingement syndrome of left shoulder 12/22/2017  Macular degeneration   Rheumatoid aortitis   Sleep apnea    does not use cpap - UNABLE TO TOLERATE MASK  Sleep apnea in adult   deviated septum repaired, most recent sleep study was negative  Status post total right knee replacement 06/01/2015 Past Surgical History: Past Surgical History: Procedure Laterality Date  ANKLE FUSION Right 05/05/2012  related to his arthritis  ANKLE FUSION Left 05/25/2013  related to his arthritis  ANKLE SURGERY Bilateral   APPLICATION OF CRANIAL NAVIGATION N/A 04/11/2022  Procedure: APPLICATION OF CRANIAL NAVIGATION;  Surgeon: Lisbeth Renshaw, MD;  Location: [facility] OR;  Service: Neurosurgery;  Laterality: N/A;  APPLICATION OF CRANIAL NAVIGATION N/A 08/08/2022  Procedure: APPLICATION OF CRANIAL NAVIGATION;  Surgeon: Lisbeth Renshaw, MD;  Location: [facility] OR;  Service: Neurosurgery;  Laterality: N/A;  CARDIAC CATHETERIZATION N/A 05/01/2015  Procedure: Left Heart Cath and Coronary Angiography;  Surgeon: Lyn Records, MD;  Location: [facility] INVASIVE CV LAB;  Service: Cardiovascular;  Laterality: N/A;  CRANIOTOMY N/A 04/11/2022  Procedure: STEREOTACTIC SUBOCCIPITAL CRANIOTOMY FOR RESECTION OF ARTERIO-VENOUS MALFORMATION;  Surgeon: Lisbeth Renshaw, MD;  Location: [facility] OR;  Service: Neurosurgery;  Laterality: N/A;  FRACTURE SURGERY    left femur fracture x 3  IR ANGIO EXTERNAL CAROTID SEL EXT CAROTID BILAT MOD SED  01/28/2022  IR ANGIO INTRA EXTRACRAN SEL INTERNAL CAROTID BILAT MOD SED  01/28/2022  IR ANGIO INTRA EXTRACRAN SEL INTERNAL CAROTID BILAT MOD SED  04/16/2022  IR ANGIO VERTEBRAL SEL VERTEBRAL BILAT MOD SED  01/28/2022  IR ANGIO VERTEBRAL SEL VERTEBRAL BILAT MOD SED  04/16/2022  KNEE ARTHROSCOPY    right x 4  LAPAROSCOPIC REVISION VENTRICULAR-PERITONEAL (V-P) SHUNT N/A 08/08/2022  Procedure: LAPAROSCOPIC VENTRICULAR-PERITONEAL (V-P) SHUNT;  Surgeon: Harriette Bouillon, MD;  Location: [facility] OR;  Service: General;  Laterality: N/A;  LUMBAR LAMINECTOMY/DECOMPRESSION MICRODISCECTOMY N/A 07/18/2022  Procedure: Repair of Pseudomeningiocele posterior;  Surgeon:  Lisbeth Renshaw, MD;  Location: [facility] OR;  Service: Neurosurgery;  Laterality: N/A;  NASAL SEPTOPLASTY W/ TURBINOPLASTY  06/25/2011  Procedure: NASAL SEPTOPLASTY WITH TURBINATE REDUCTION;  Surgeon: Osborn Coho, MD;  Location: [facility] OR;  Service: ENT;  Laterality: Bilateral;  PLACEMENT OF LUMBAR DRAIN N/A 07/18/2022  Procedure: PLACEMENT OF LUMBAR DRAIN;  Surgeon: Lisbeth Renshaw, MD;  Location: [facility] OR;  Service: Neurosurgery;  Laterality: N/A;  TONSILLECTOMY    TOTAL KNEE ARTHROPLASTY Right 06/01/2015  Procedure: RIGHT TOTAL KNEE ARTHROPLASTY;  Surgeon: Kathryne Hitch, MD;  Location: [facility] ORS;  Service: Orthopedics;  Laterality: Right;  Block+general  VENTRICULOPERITONEAL SHUNT Right 08/08/2022  Procedure: LAP ASSITED SHUNT INSERTION VENTRICULAR-PERITONEAL;  Surgeon: Lisbeth Renshaw, MD;  Location: [facility] OR;  Service: Neurosurgery;  Laterality: Right; Royce Macadamia 08/18/2022, 10:51 AM  Recent Labs    08/18/22 0623  WBC 9.3  HGB 9.7*  HCT 30.1*  PLT 191     Recent Labs    08/18/22 0623  NA 137  K 3.8  CL 102  CO2 27  GLUCOSE 110*  BUN 15  CREATININE 1.06  CALCIUM 8.7*     Intake/Output Summary (Last 24 hours) at 08/19/2022 2206 Last data filed at 08/19/2022 1759 Gross per 24 hour  Intake 580 ml  Output 1700 ml  Net -1120 ml         Physical Exam: Vital Signs Blood pressure 103/63, pulse 73, temperature 98.3 F (36.8 C), temperature source Oral, resp. rate 16, height 6\' 1"  (1.854 m), weight 88.4 kg, SpO2 97 %.    General: alert, sitting up in bed, no acute distress  HEENT:  R scalp  - shunt in place, staples intact.  PERRLA. Wet vocal quality - improved Neck: Supple without JVD Heart: Reg rate and rhythm. No murmurs rubs or gallops Chest: CTA bilaterally without wheezes, rales, or rhonchi; no distress.  Abdomen: Soft, nonTTP, non-distended, bowel sounds positive. Multiple laparoscopic incisions with glue, c/d/i Extremities: No clubbing, cyanosis, or edema.    Psych: Pt's affect is appropriate . Pt is cooperative.   Skin: Clean and intact without signs of breakdown,  healed incision/scar to posterior neck, with fluctuant fluid -significantly decreased  + staples over shunt site c/d/I + Abdominal trochar sites well approximated with dermabond, c/d/I.  No tenderness to palpation  PRIOR EXAMS: Neuro: Alert and oriented x 4. + Right facial droop  + Speech apraxia and dysarthria -stable from last exam, is getting better at slowing down and using compensatory strategies to improve his intelligibility Strength antigravity and against resistance in bilateral upper extremities and lower extremities Sensation intact light touch in all 4 extremities + Tender to palpation around the fluctuant fluid side and throughout the neck, bilateral temples, and forehead. -Ongoing + FTN  altered R>L today -improved this week Musculoskeletal: Normal bulk and tone, no joint swelling noted.  Moving all 4 limbs antigravity and against resistance.    Assessment/Plan: 1. Functional deficits which require 3+ hours per day of interdisciplinary therapy in a comprehensive inpatient rehab setting. Physiatrist is providing close team supervision and 24 hour management of active medical problems listed below. Physiatrist and rehab team continue to assess barriers to discharge/monitor patient progress toward functional and medical goals  Care Tool:  Bathing    Body parts bathed by patient: Right arm, Left arm, Chest, Front perineal area, Abdomen, Buttocks, Right upper leg, Left upper leg, Right lower leg, Left lower leg, Face         Bathing assist Assist Level: Minimal Assistance - Patient > 75%     Upper Body Dressing/Undressing Upper body dressing   What is the patient wearing?: Pull over shirt    Upper body assist Assist Level: Supervision/Verbal cueing    Lower Body  Dressing/Undressing Lower body dressing      What is the patient wearing?: Pants,  Underwear/pull up     Lower body assist Assist for lower body dressing: Minimal Assistance - Patient > 75%     Toileting Toileting    Toileting assist Assist for toileting: Minimal Assistance - Patient > 75%     Transfers Chair/bed transfer  Transfers assist     Chair/bed transfer assist level: Contact Guard/Touching assist     Locomotion Ambulation   Ambulation assist      Assist level: Minimal Assistance - Patient > 75% Assistive device: Walker-rolling Max distance: 150   Walk 10 feet activity   Assist     Assist level: Minimal Assistance - Patient > 75% Assistive device: Walker-rolling   Walk 50 feet activity   Assist    Assist level: Minimal Assistance - Patient > 75% Assistive device: Walker-rolling    Walk 150 feet activity   Assist    Assist level: Minimal Assistance - Patient > 75% Assistive device: Walker-rolling    Walk 10 feet on uneven surface  activity   Assist     Assist level: Minimal Assistance - Patient > 75% Assistive device: Development worker, international aid     Assist Is the patient using a wheelchair?: Yes Type of Wheelchair: Manual    Wheelchair assist level: Minimal Assistance - Patient > 75% Max wheelchair distance: 150'    Wheelchair 50 feet with 2 turns activity    Assist        Assist Level: Minimal Assistance - Patient > 75%   Wheelchair 150 feet activity     Assist      Assist Level: Minimal Assistance - Patient > 75%   Blood pressure 103/63, pulse 73, temperature 98.3 F (36.8 C), temperature source Oral, resp. rate 16, height 6\' 1"  (1.854 m), weight 88.4 kg, SpO2 97 %.   Medical Problem List and Plan: 1. Functional deficits secondary to cerebellar CVA, c/b recent pseudomeningocele repair and Hx AVM resection December 2023..     - 4/4: Note Dr. Alanson Puls stating patient likely did, in fact, have a cerebellar stroke and not just post-operative changes as previously noted. Changing  primary Dx to cerebellar infarct.           -patient may shower             -ELOS/Goals: 7-10 days, Mod I PT/OT/SLP; DC goal extended to 4/23             -Admit to CIR -Decadron as indicated 4 MG Q8 HOURS with TAPER STARTING 3/29 to 4/10 - 4/3: Worsening hydrocephalus on CT ->VP shunt placment 4/5 -> adjusted 4/8 to 1.0 with some subjective improvement in ataxia on exam  - 4/10: CT head for worsening headaches, stable.  Treatment as below.  2.  Antithrombotics: -DVT/anticoagulation:  Pharmaceutical: Lovenox 40mg  QD -antiplatelet therapy: Aspirin 81 mg daily- currently held 08/09/22 d/t surgery, spoke with Dr. Conchita Paris 08/09/22, states it should restart in 1-2 days - > resume 4/9  3. Pain Management: Tylenol as needed, Diazepam 5mg  q8h PRN muscle spasms, Voltaren 1% 2g QID - 3/29: Schedule Tylenol 1000 mg TID, add gabapentin 300 mg QHS for headache and sleep -08/02/22 pt previously on Hydrocodone-APAP 5-325mg  at prior discharge, was still using this, would like to keep; reordered for now, 5-325mg  q6h PRN x7 days, if needed longer then weekday team to discuss; adjusted tylenol to 650mg  TID to avoid overdosing of tylenol; would also prefer gabapentin be scheduled  BID rather than PRN -08/03/22 - 4/1 improved control, cont regimen for now -08/09/22 Hydrocodone 5-325mg  q6h PRN readded since he had surgery again yesterday; also added morphine  q4h PRN in case he needs IV pain control postoperatively; monitor closely -08/11/22 pain ongoing, monitor and convert to PO regimen in AM. For HA, Increase gabapentin from 300 mg BID to 300/300/600 -Sleep and headache improved with gabapentin, monitor -4/10: Headache worse, diffuse this a.m.  CT head stable.  Patient did not tolerate discontinuation of as needed IV morphine.  Scheduled Norco every 6, added Topamax 25 mg twice daily, reduce morphine to 2 mg IV twice daily as needed. -4/10: Maintain Topamax 25 mg twice daily, Norco scheduled.  Increase gabapentin to 600  mg 3 times daily, increase as needed morphine to 3 mg IV twice daily.  Goal to wean off IV off by the end of the week. -4/12: Increase topomax to 50 mg BID. Weaning morphine PRN over the weekend to 2 mg BID PRN 4/13, 1 mg BID PRN 4/14, then DC.   -4/13-14/24 pain managed for now, monitor with weaning morphine   - 4/15: Weaned off of morphine starting today, continue scheduled pain regimen with plan to wean down later this week. ?  Association with pressure on pseudomeningocele in the back of the head.  May benefit from a cervical collar, however this could also increase the pressure make it worse.  Will look at repositioning options today.  4/16: Switch gabapentin 600 mg 3 times daily to Lyrica 150 mg twice daily.  Read time 4 times daily Norco 5 mg to allow sleep from 2200 to 0600.  Added on at 2 additional PRNs of oxycodone 5 mg for pain control if needed with longer intervals during sleep.  Discontinue Topamax due to no benefit and possible effects on mood.  4. Mood/Behavior/Sleep: Zoloft 50 mg daily             -antipsychotic agents: N/A   -3/29: Gabapentin as above, melatonin  QHS PRN  -08/02/22 scheduled Gabapentin BID and melatonin QHS per request  -08/03/22 greatly improved sleep - continue  -4/2: Added trazodone 50 mg PRN for ?insomnia; not endorsed by patient.  -4/3: Patient endorses ongoing insomnia, uncertain etiology.  As needed trazodone not utilized, will schedule starting tonight. - sleep stable -08/09/22 slept poorly but suspect could be from missing some meds yesterday and postop recovery; monitor for now -08/10/22 sleep gradually improving, monitor - 4/8: remains poor d.t HA, pain; tx as above - 4/9: Endorses worsening depression, more labile mood secondary to frustration regarding his diagnoses.  Agreeable to increasing antidepressant sertraline from 50 to 75 mg.  Could also consider switching to BuSpar given some studies showing benefit for cerebellar ataxia. - 4/16: Patient  endorses significant difficulty with adjustment and depression regarding perceived failure to progress, feeling like things will not get better.  Did encourage him regarding his daily functional games, and talked about stroke recovery extending up to a year.  No SI/HI.  Sertraline increased as above, will take a while to appreciate effects.  5. Neuropsych/cognition: This patient is capable of making decisions on his own behalf. 6. Skin/Wound Care: Routine skin checks 7. Dysphagia/Fluids/Electrolytes/Nutrition: Routine in and outs with follow-up chemistries  - 3/29: Cr stable, BUN increased, encourage PO fluids and re-test Monday--ordered weekly; stable CR, BUN improved  4/1; stable 4/3; stable 4/8 -08/02/22 takes Vitamin D 2000IU QD and Calcium supplement at home; reordered -08/10/22 pt dislikes diet order/purees and refuses to eat  it, would prefer to go back to prior diet (bacon, eggs, mashed potatoes)-- not on SLP schedule today but spoke with speech therapy and asked if they could possibly do a quick eval to see if he can advance his diet today-- they will try to get to him today - 4.8: diet advanced to Dys 3; pt reports improved POS -4/10: Nutrition evaluated, meeting nutritional needs.  Supplements as needed.  Monitor -4/12: wet vocal quality and feel secretions interfering with POs; add loratidine 1 tab daily for drying -mildly improved, may remove later this week 4-15: Creatinine uptrending, although BUN remains stable and other electrolytes look good.  Encourage p.o. fluids.  Retest Thursday.  - 4/16: Will discuss possibility of utilizing Provell cup to help with p.o. intakes, as patient remains under supervision of fluid intakes and this is limiting his ability to drink.  Did have mildly improved p.o.'s today of 580 cc.  8.  Hyperlipidemia.  Crestor 20mg  daily 9.  Rheumatoid arthritis.  Consider resuming home regimen of rituximab, methotrexate and sulfasalazine after steroids completed (4/10).   Currently on Dexamethasone with taper. -08/02/22 wife concerned with restarting of meds; advised to readdress this once steroids have tapered around 08/13/22 -4-10: Wife requesting resumption of rituximab, methotrexate, sulfasalazine.  Theoretical risk of increased post-operative infection, however on lit review appears minimal. Will get CBC, CMP in AM to ensure WBC count decreases in setting of completed steroid taper, then resume. - 4-11: No signs or symptoms of infection, white count downtrending after steroid treatment completed.  Will touch base with rheum (Dr. Corliss Skains) about restarting regimen; no direct contrainidications identified.  -4/12: Per rheumatology, can restart all immunologics 2 weeks post-op (4/19)   10.  CAD.  Continue low-dose aspirin. Denies CP 11.  Dysphagia. Regular diet with nectar liquids. Follow up SLP 12.  Chronic systolic congestive heart failure.  Monitor for any signs of fluid overload. Daily weight. Currently HOLDING Spironolactone 12.5mg  QD (halved during last hospital stay), Lisinopril 10mg  QD, Metoprolol 25mg  QD  -3/30-4/5 wt stable, BP stable, continue to monitor -08/09/22 wt stable, BPs soft overnight but somewhat close to prior, improved this morning; monitor closely tonight, may need gentle hydration, hold off on labs for now -4/7-11/24 BPs stable monitor -4/11: Weights downtrending. BP stable. Monitor.  -4/12: mild hypotension today, asymptomatic. PO fluids poor d/t thickened liquids. BMP for AM. Encourage fludis -08/16/22 wt stable, BP still soft but stable, BMP unremarkable; monitor -08/17/22 wt/BP stable, cont to monitor  - 4/15: Intermittent hypotension, encourage p.o. fluids.  Patient endorses he is asymptomatic, requests DC IV today. 4-16: Blood pressures improved with better p.o. intakes, monitor Filed Weights   08/17/22 0600 08/18/22 0500 08/19/22 0541  Weight: 89.1 kg 88.8 kg 88.4 kg   Vitals:   08/16/22 1957 08/17/22 0522 08/17/22 0557 08/17/22  2014  BP: 115/62 (!) 84/60 107/71 114/84   08/18/22 0439 08/18/22 1313 08/18/22 1508 08/18/22 1511  BP: (!) 94/59 134/79 113/85 116/83   08/18/22 1927 08/19/22 0543 08/19/22 1321 08/19/22 1935  BP: 110/79 110/62 104/70 103/63     13.  OSA. Intolerance to CPAP. Counseling on CPAP benefits.  14. Prediabetes. A1C 6.0. Monitor on CBC/CMP 15. Leukocytosis. Likely due to steroids. Stable. Monitor for s/s infection.    - 10.2->11->10.6 -> 12 4/3 ; monitor   - 4/8: 12.9 -> 11 4/11-.  9.3 4-15, normal  16. Macular degeneration: takes Preservision AREDS2 and Systane 0.6% eye drops at home -08/02/22 ordered hospital formulary alternatives- Lacrilube BID and  Prosight multivitamin QD - 4/1-4/2: Wife concerned regarding use of alternative to systane; recommended bringing in from home  - 4/4: Pharmacy to assist in nonformulary order for home systane; ordered  17. R wrist bruising - stable, improving -08/02/22 wife concerned with bruises to R wrist, appear older/healing, no tenderness or crepitus/deformities, no swelling. Suspect minor pressure related bruise, doubt underlying bony injury, doubt need for xray; likely related to blood thinners; monitor for now  - 4/2: Mild HgB drop 10.6->9.8-> 10.1 -> 9.9 post-op  18.  Hydrocephalus s/p AVM resection and pseudomeningocele repair. Can remove staples 4/19.   - 4/3: Cognitive slowing, increased dysarthria and ataxia.  See above, CT head and labs today for workup.  No focal weakness or sensory changes, no gross cranial nerve deficits to indicate need for acute stroke workup.  Last known normal would have been at bedtime last night. - 4/4: CT head concerning for worsening hydrocephalus. See above.  - 4/5:  Dr. Alanson Puls and Dr. Jake Samples recommending VP shunt, family agreeable, plan to OR 4/5 and return to IPR if no complications -08/09/22 VP shunt placed last night, doing well, some mild abd discomfort but nondistended and no exquisite tenderness; pain control as  above - 4.8: pending neurosurgery eval, per note may consider decreasing Strata II valve setting tomorrow to 1.0 or 0.5-adjusted to 1.0, with improvements - 4/10: CT head stable; Tx headache as above. Discussed with wife that imaging may take some time post-op to improve.  - 4/12: If no significant improvement by Monday, will ask for Dr. Conchita Paris to re-evaluate shunt settings (currently on 1.0) -4/15 -significant improvement in ataxia in right upper extremity and voice today.  Pseudomeningocele appears to be decreasing, continue to monitor. 4-16: Dr. Conchita Paris evaluated shunt overnight, no new recommendations.    LOS: 19 days A FACE TO FACE EVALUATION WAS PERFORMED  Angelina Sheriff 08/19/2022, 10:06 PM

## 2022-08-19 NOTE — Progress Notes (Signed)
Occupational Therapy Session Note  Patient Details  Name: Christopher Yu MRN: [id number] Date of Birth: [birth year]  Today's Date: 08/19/2022 OT Individual Time: 1694-5038 OT Individual Time Calculation (min): 25 min    Short Term Goals: Week 3:  OT Short Term Goal 1 (Week 3): Patient will demonstrate increased snmoothness and accuracy by slowing movements down while bathing with a wash cloth OT Short Term Goal 2 (Week 3): Patient will maintain dynamic standing balance without a device when reaching outside base of support and min A OT Short Term Goal 3 (Week 3): Patient will don and doff toothpaste lid without dropping it on 2/3 trials  Skilled Therapeutic Interventions/Progress Updates:  Skilled OT intervention completed with focus on cognition, BUE exercises to address truncal/BUE gross motor control. Pt received upright in bed, fixated on not having a therapy schedule. No pain reported.  Per pt request, OT hand wrote his therapy schedule for the day with pt able to recall familiar names of people and correctly reported today's date.  Suggested that pt get into w/c for therapy however pt responded "no, how about you get me coffee instead." OT offered EOB activity however pt again declined. OT utilized enticement of coffee at end of session to encourage participation in therapy but pt only agreeable to bed level.   Pt completed the following exercises with green thera band to promote gross motor control of trunk/BUE that is challenged by ataxia: -2x10 tricep extension each arm, anchored on bed rail, with emphasis on eccentric phase -2x10 PNF punches each arm, with emphasis on eccentric phase Pt was interested in how to breathe during the exercises with education provided on biomechanical breathing techniques to improve RPE and pt had good carryover with this strategy doing the exercises  Pt remained upright in bed with NT notified due to therapist time constraint to retrieve  pt's coffee as promised, with bed alarm on/activated, and with all needs in reach at end of session.   Therapy Documentation Precautions:  Precautions Precautions: Fall Restrictions Weight Bearing Restrictions: No Other Position/Activity Restrictions: Ataxia, impulsive, impaired problem-solving   Therapy/Group: Individual Therapy  Melvyn Novas, MS, OTR/L  08/19/2022, 10:59 AM

## 2022-08-19 NOTE — Progress Notes (Signed)
Occupational Therapy Session Note  Patient Details  Name: Christopher Yu MRN: [id number] Date of Birth: 01-25-1961  Today's Date: 08/19/2022 OT Individual Time: 6384-5364 OT Individual Time Calculation (min): 53 min   Short Term Goals: Week 2:  OT Short Term Goal 1 (Week 2): Patient will maintain dynamic balance during BADL tasks with no more than min A OT Short Term Goal 1 - Progress (Week 2): Met OT Short Term Goal 2 (Week 2): Patient will reach for objects with R hand and bring to mouth with supervision OT Short Term Goal 2 - Progress (Week 2): Met OT Short Term Goal 3 (Week 2): Patient will tie shoes using B UEs OT Short Term Goal 3 - Progress (Week 2): Met  Skilled Therapeutic Interventions/Progress Updates:    Pt greeted semi-reclined in bed with doctor speaking with patient. Pt agreeable to OT treatment session but missed 10 minutes due to Doctor rounding. Pt drank his ensure at bed level with 1 coughing episode. He then completed bed mobility with supervision and donned shoes at EOB with supervision and cues to tie shoes. Pt stood w/ RW and ambulated in room with min A to get to the wc. Pt brought outside in wc for time management. Worked on B UE fine motor coordination using small foam blocks and cup. Focus on in-hand manipulation, and translation of small cubes to place in cup. Continued working on B UE FMC with soft yellow therautty and locating beads, then pulling them out ising pincer grasp. Pt completed on B UE's. Sit<>stands at picnic table with GCA and cues for technique. Pt returned to room and declined to sit up in wc despite max encouragement. Pt left semi-reclined in bed with bed alarm on, call bell in reach, and needs met,   Therapy Documentation Precautions:  Precautions Precautions: Fall Restrictions Weight Bearing Restrictions: No Other Position/Activity Restrictions: Ataxia, impulsive, impaired problem-solving Pain: Pain Assessment Pain Scale:  0-10 Pain Score: 0-No pain   Therapy/Group: Individual Therapy  Mal Amabile 08/19/2022, 11:49 AM

## 2022-08-19 NOTE — Plan of Care (Signed)
  Problem: RH Balance Goal: LTG Patient will maintain dynamic standing with ADLs (OT) Description: LTG:  Patient will maintain dynamic standing balance with assist during activities of daily living (OT)  Flowsheets (Taken 08/19/2022 1056) LTG: Pt will maintain dynamic standing balance during ADLs with: Contact Guard/Touching assist Note: Goal downgraded 4/16-ESD   Problem: Sit to Stand Goal: LTG:  Patient will perform sit to stand in prep for activites of daily living with assistance level (OT) Description: LTG:  Patient will perform sit to stand in prep for activites of daily living with assistance level (OT) Flowsheets (Taken 08/19/2022 1056) LTG: PT will perform sit to stand in prep for activites of daily living with assistance level: Contact Guard/Touching assist Note: Goal downgraded 4/16-ESD   Problem: RH Grooming Goal: LTG Patient will perform grooming w/assist,cues/equip (OT) Description: LTG: Patient will perform grooming with assist, with/without cues using equipment (OT) Flowsheets (Taken 08/19/2022 1056) LTG: Pt will perform grooming with assistance level of: Supervision/Verbal cueing Note: Goal downgraded 4/16-ESD   Problem: RH Bathing Goal: LTG Patient will bathe all body parts with assist levels (OT) Description: LTG: Patient will bathe all body parts with assist levels (OT) Flowsheets (Taken 08/19/2022 1056) LTG: Pt will perform bathing with assistance level/cueing: Contact Guard/Touching assist Note: Goal downgraded 4/16-ESD   Problem: RH Dressing Goal: LTG Patient will perform upper body dressing (OT) Description: LTG Patient will perform upper body dressing with assist, with/without cues (OT). Flowsheets (Taken 08/19/2022 1056) LTG: Pt will perform upper body dressing with assistance level of: Supervision/Verbal cueing Note: Goal downgraded 4/16-ESD Goal: LTG Patient will perform lower body dressing w/assist (OT) Description: LTG: Patient will perform lower body  dressing with assist, with/without cues in positioning using equipment (OT) Flowsheets (Taken 08/19/2022 1056) LTG: Pt will perform lower body dressing with assistance level of: Contact Guard/Touching assist Note: Goal downgraded 4/16-ESD   Problem: RH Toileting Goal: LTG Patient will perform toileting task (3/3 steps) with assistance level (OT) Description: LTG: Patient will perform toileting task (3/3 steps) with assistance level (OT)  Flowsheets (Taken 08/19/2022 1056) LTG: Pt will perform toileting task (3/3 steps) with assistance level: Contact Guard/Touching assist Note: Goal downgraded 4/16-ESD   Problem: RH Toilet Transfers Goal: LTG Patient will perform toilet transfers w/assist (OT) Description: LTG: Patient will perform toilet transfers with assist, with/without cues using equipment (OT) Flowsheets (Taken 08/19/2022 1056) LTG: Pt will perform toilet transfers with assistance level of: Contact Guard/Touching assist Note: Goal downgraded 4/16-ESD   Problem: RH Tub/Shower Transfers Goal: LTG Patient will perform tub/shower transfers w/assist (OT) Description: LTG: Patient will perform tub/shower transfers with assist, with/without cues using equipment (OT) Flowsheets (Taken 08/19/2022 1056) LTG: Pt will perform tub/shower stall transfers with assistance level of: Contact Guard/Touching assist Note: Goal downgraded 4/16-ESD

## 2022-08-19 NOTE — Progress Notes (Signed)
Physical Therapy Session Note  Patient Details  Name: Christopher Yu MRN: [id number] Date of Birth: 07-Feb-1961  Today's Date: 08/19/2022     Attempted to see patient at 1:68m for scheduled PT session. Pt sleeping but able to be somewhat aroused. Told patient we had a therapy session, but he said he was too tired and continued to close eyes. Offered to come back in 20 min to try again. Pt stated that was ok.  Returned at 2pm, pt still asleep and continued to sleep when therapist attempted to engage him. Missed 45 min of skilled PT due to fatigue.    Therapy Documentation Precautions:  Precautions Precautions: Fall Restrictions Weight Bearing Restrictions: No Other Position/Activity Restrictions: Ataxia, impulsive, impaired problem-solving General: PT Amount of Missed Time (min): 45 Minutes PT Missed Treatment Reason: Patient fatigue;Patient unwilling to participate   Karolee Stamps Darrol Poke, PT, DPT, CBIS  08/19/2022, 2:08 PM

## 2022-08-19 NOTE — Progress Notes (Signed)
Patient ID: Christopher Yu, male   DOB: 1961/04/07, 62 y.o.   MRN: [id number]  SW met with pt in room to provide updates from team conference, and d/c date remains 4/23. Pt is aware outpatient PT/OT/SLP are recommended. SW will follow-up with his wife.   50- SW spoke with [name] to provide updates, and d/c date remains in place. Discussed DME recs: [facility]in[facility]. She inquired about w/c. SW shared PT is trying to determine if transport chair or w/c is more appropriate. SW shared pt husband mentioned requiring RW. If needed will have to private pay. SW discussed family education. Wife will follow-up about fam edu. Preferred outpatient location [facility] for [address] location.   Cecile Sheerer, MSW, LCSWA Office: [phone] Cell: [phone] Fax: [phone]

## 2022-08-19 NOTE — Patient Care Conference (Signed)
Inpatient RehabilitationTeam Conference and Plan of Care Update Date: 08/19/2022   Time: 10:53 AM   Patient Name: Christopher Yu      Medical Record Number: [id number]  Date of Birth: 05-Jan-1961 Sex: Male         Room/Bed: 1W43X/5Q00Q-67 Payor Info: Payor: [employer]UE [employer]UE [employer] / Plan: BCBS COMM PPO / Product Type: *No Product type* /    Admit Date/Time:  07/31/2022  2:07 PM  Primary Diagnosis:  Cerebellar stroke  Hospital Problems: Principal Problem:   Cerebellar stroke Active Problems:   Pseudomeningocele   Adjustment disorder    Expected Discharge Date: Expected Discharge Date: 08/26/22  Team Members Present: Physician leading conference: Dr. Elijah Birk Social Worker Present: Cecile Sheerer, LCSWA Nurse Present: Vedia Pereyra, RN PT Present: Ralph Leyden, PT OT Present: Kearney Hard, OT SLP Present: Feliberto Gottron, SLP PPS Coordinator present : Fae Pippin, SLP     Current Status/Progress Goal Weekly Team Focus  Bowel/Bladder   continent of b/b   Remain continent   Assit with tolieting qshift and prn    Swallow/Nutrition/ Hydration   Regular textures with nectar-thick liquids, Min A   Supervision  tolerance of current diet, use of strategies, trials of thin with use of strategy    ADL's   min A overall, min a balance, occasional mod A   goals downgraded to CGA   B fine moto coordination, pt/fmaily education, self-care retraining, frustration tolerance activty tolerance    Mobility   Supv bed mobility; CGA/MinA for all functional mobility with the use of a RW- Limited byb participation due to fatigue and headaches   Supv with LRAD  Dynamic stability, endurance/activity tolerance, NMR, sit/stands, transfers, gait, stair mobility, discharge planning, family education, etc.    Communication   Mod-Max   Min A   use of speech intelligibility strategies, diaphragmatic breathing    Safety/Cognition/ Behavioral Observations  Mod A    Supervision   selective attention and emregent awareness    Pain   Generalized pain: scheduled noro   Pain level <3   Assess pain qshift and prn    Skin   staples right side of head, ota, healed incision to the back of neck ota   Remain free of new skin breakdown/infection  Assess skin qshift and prn      Discharge Planning:  Pt will discharge to home with support from his wife, and PRN support from their children. Fam edu completed with his wife on Friday (4/12) and Monday (4/15). SW will confirm there are no barriers to discharge.   Team Discussion Cerebellar stroke with VP shunt placed 04/05. Staples can be removed on the 19th. Continent B/B. Incisions are healing. Pain managed with both scheduled and PRN medications. Patient irritable with staff since placement of shunt. Ataxia has improved. RA medications to be started at end of week. Daily weights, Encourage fluids. Regular diet nectar thick liquids with coughing noted. Severe dysarthria. May trial water protocol. Working on swallowing strategies. Most likely will go home on thickened liquids. Sleepy with therapy but coordination is improving. Slow movements. Min A overall, Min A balance with occasional Mod A. Goals downgraded CGA. CGA/Min A for functional mobility with use of RW. Limited by participation d/t fatigue and H/A.   Patient on target to meet rehab goals: yes, progress toward goals  *See Care Plan and progress notes for long and short-term goals.   Revisions to Treatment Plan:  Medication adjustments. Start RA medications later in week. Monitor  daily weights and I/Os.  Teaching Needs: Medications, safety, self care, gait/transfer training, appropriate texture of liquids, etc.   Current Barriers to Discharge: Decreased caregiver support, Home enviroment access/layout, Wound care, Behavior, and Nutritional means  Possible Resolutions to Barriers: Continued family education, nursing education, order recommended DME      Medical Summary Current Status: medically complicated by post-op pain/headache, pseudomeningeocele, recent VP shunt, dysphagia, insomnia, AKI and hypotension  Barriers to Discharge: Behavior/Mood;Complicated Wound;Electrolyte abnormality;Hypotension;Inadequate Nutritional Intake;Medical stability;Self-care education;Uncontrolled Pain  Barriers to Discharge Comments: headache, difficulty with hydration intakes, sleep deficits, post-op wounds Possible Resolutions to [employer] Focus: Staple removal upcoming, titration of pain medication, encouraging fluids and PO intakes, lab monitorring and intermittent IVF as needed   Continued Need for Acute Rehabilitation Level of Care: The patient requires daily medical management by a physician with specialized training in physical medicine and rehabilitation for the following reasons: Direction of a multidisciplinary physical rehabilitation program to maximize functional independence : Yes Medical management of patient stability for increased activity during participation in an intensive rehabilitation regime.: Yes Analysis of laboratory values and/or radiology reports with any subsequent need for medication adjustment and/or medical intervention. : Yes   I attest that I was present, lead the team conference, and concur with the assessment and plan of the team.   Jearld Adjutant 08/19/2022, 3:43 PM

## 2022-08-19 NOTE — Progress Notes (Signed)
Speech Language Pathology Daily Session Note  Patient Details  Name: Keyjuan Tiano MRN: [id number] Date of Birth: 1960-12-13  Today's Date: 08/19/2022 SLP Individual Time: 1433-1510 SLP Individual Time Calculation (min): 37 min  Short Term Goals: Week 3: SLP Short Term Goal 1 (Week 3): Patient will self-monitor and correct wet vocal quality with mod verbal cues. SLP Short Term Goal 2 (Week 3): Patient will self-monitor and correct communication breakdowns with Mod verbal cues. SLP Short Term Goal 3 (Week 3): Patient will utilize diaphragmtic breathing at the short phrase level during structured tasks to achieve ~80% intelligibility with Mod verbal and visual cues. SLP Short Term Goal 4 (Week 3): Patient will demonstrate sustained attention to functional tasks for 45 minutes with min verbal cues for redirection. SLP Short Term Goal 5 (Week 3): Patient will consume curent diet with Min verbal cues for use of swallowing compensatory strategies.  Skilled Therapeutic Interventions: Skilled treatment session focused on communication goals. Upon arrival, patient was asleep in bed and slow to rouse. SLP facilitated session by Min-Mod A verbal cues for use of diaphragmatic breathing while in the semi-reclined position in bed at the word level. Mod verbal cues were also needed for use of over-articulation at the phrase level during an informal conversation to achieve ~80% intelligibility. Overall, patient's speech intelligibility appeared improved compared to previous session. Patient left upright in bed with alarm on and all needs within reach. Continue with current plan of care.      Pain No/Denies Pain   Therapy/Group: Individual Therapy  Jaston Havens 08/19/2022, 3:26 PM

## 2022-08-20 NOTE — Progress Notes (Signed)
Physical Therapy Session Note  Patient Details  Name: Christopher Yu MRN: [id number] Date of Birth: 1960-06-20  Today's Date: 08/20/2022 PT Individual Time: 1535-1615 PT Individual Time Calculation (min): 40 min   Short Term Goals: Week 1:  PT Short Term Goal 1 (Week 1): Patient will perform bed/chair transfer with LRAD and CGA PT Short Term Goal 1 - Progress (Week 1): Progressing toward goal PT Short Term Goal 2 (Week 1): Patient will ambulate >48' with LRAD and CGA PT Short Term Goal 2 - Progress (Week 1): Progressing toward goal PT Short Term Goal 3 (Week 1): Patient will ascend/descend > a flight of stairs with R HR and CGA PT Short Term Goal 3 - Progress (Week 1): Progressing toward goal Week 2:  PT Short Term Goal 1 (Week 2): Patient will perform bed/chair transfer with LRAD and CGA PT Short Term Goal 1 - Progress (Week 2): Met PT Short Term Goal 2 (Week 2): Patient will ambulate >103' with LRAD and CGA PT Short Term Goal 2 - Progress (Week 2): Met PT Short Term Goal 3 (Week 2): Patient will ascend/descend > a flight of stairs with R HR and CGA PT Short Term Goal 3 - Progress (Week 2): Met  Skilled Therapeutic Interventions/Progress Updates:  Patient supine in bed on entrance to room. Patient alert, relates fatigue and not agreeable to PT session. Pt also has wife and 2 dtrs in room.   Patient with no pain complaint at start of session. Later relates discomfort from residual adhesive on arms. Adhesive remover applied to pt's improved comfort.   Wife with several questions re: pt's personal RW which she has brought in, potential removal of staples on 4/19, CLOF.   RW assessed and wheels are sturdy and mobile. Discussed need for removal of rubber points and replacement with plastic "skis" for improved mobility over changes in flooring throughout home. Pt's difficulty in managing RW potential from increased pressure into RW from patient during use as well as extended  height to tallest adjustment which will decrease stability in legs of RW. Recommend taller height RW and/ or heavier material than aluminum in standard height walker in order to improve stability in AD.   Wife also asking re: desire to acquire w/c for pt. As patient has received RW from recent hospitalization, unsure that insurance will cover w/c. However w/c can be acquired for <$200 from [employer] from [employer]. Will just need appropriate size for pt. Can also acquire from [facility] which she is familiar with. Check in with CSW to confirm whether insurance will/ will not cover cost.   Discussion with pt re: slowed motor planning and need to slow down all actions in order to perform conscious practice of movement required before performing in order to give brain time to organize plan for improved smoothness of movements. Family relates that pt has always been "on the go".   Patient remained supine at end of session with brakes locked, bed alarm set, and all needs within reach.   Therapy Documentation Precautions:  Precautions Precautions: Fall Restrictions Weight Bearing Restrictions: No Other Position/Activity Restrictions: Ataxia, impulsive, impaired problem-solving General:   Vital Signs: Therapy Vitals Temp: 97.6 F (36.4 C) Temp Source: Oral Pulse Rate: 71 Resp: 18 BP: 102/75 Patient Position (if appropriate): Lying Oxygen Therapy SpO2: 93 % O2 Device: Room Air Pain:  No pain related this session, but does relate fatigue and discomfort from "workouts" in previous sessions this day.  Therapy/Group: Individual Therapy  Loel Dubonnet PT, DPT, CSRS 08/20/2022, 4:30 PM

## 2022-08-20 NOTE — Progress Notes (Signed)
Occupational Therapy Session Note  Patient Details  Name: Christopher Yu MRN: [id number] Date of Birth: 11/06/1960  Today's Date: 08/20/2022 OT Individual Time: 0930-1030 1st Session, 1415-1500 2nd Session  OT Individual Time Calculation (min): 60 min, 45 min    Short Term Goals: Week 3:  OT Short Term Goal 1 (Week 3): Patient will demonstrate increased snmoothness and accuracy by slowing movements down while bathing with a wash cloth OT Short Term Goal 2 (Week 3): Patient will maintain dynamic standing balance without a device when reaching outside base of support and min A OT Short Term Goal 3 (Week 3): Patient will don and doff toothpaste lid without dropping it on 2/3 trials  Skilled Therapeutic Interventions/Progress Updates:   Session 1:  Pt seen for am self care routine with focus on set up of items, standing tolerance and balance, coordination related to shower and self care safety and independence progression. Pt in bed at start of session, moved to EOB with mod I, sit to stand with weighted RW with close S, amb to dresser for item retrieval and then stand to void at toilet with close S incl clothing mngt. Transfer in and out of stall shower with close S. Significantly improved graded control and balance with less ataxia overall for BADL's and amb this session. No dropping of items noted. Stood sink side for oral care, seated and intermittent standing sink side for dressing. HR 100 post full shower routine, 2 min recovery rest EOB with HR back to baseline 86 bpm. Pt requesting bed level at end of sesison to complete computer work. Left pt resting with bed alarm active, needs and nurse call button in reach.   Pain: pressure in head 4/10 with rest, pain meds and breathing   Session 2:  Pt resting bed level upon OT arrival. Open to activity in middle gym for working on standing balance. OT transported pt w/c level to gym for energy conservation. Pt completed throw and toss  Level Beginner on [employer] with cga fading to close S holding to B hand rails for task and stepping up and down x 2 intervals with seated rests in between in armchair. Pt then completed mid level maze x 2 intervals with increased time but fair-good accuracy. Pt agreed to amb back to room from gym space requiring close S with weighted RW. No episodes of LOB with inceased overall fluidity and very mild ataxia this day. Left bed level with all safety measures in place.    Pain: 5/10 head pressure reported with rest and distraction     Therapy Documentation Precautions:  Precautions Precautions: Fall Restrictions Weight Bearing Restrictions: No Other Position/Activity Restrictions: Ataxia, impulsive, impaired problem-solving    Therapy/Group: Individual Therapy  Vicenta Dunning 08/20/2022, 7:45 AM

## 2022-08-20 NOTE — Progress Notes (Signed)
Physical Therapy Session Note  Patient Details  Name: Christopher Yu MRN: [id number] Date of Birth: 1961/01/05  Today's Date: 08/20/2022 PT Individual Time: 0800-0855 PT Individual Time Calculation (min): 55 min   Short Term Goals: Week 3:  PT Short Term Goal 1 (Week 3): STGs=LTGs secondary to ELOS  Skilled Therapeutic Interventions/Progress Updates:   Chart reviewed and pt agreeable to therapy. Pt received semi-reclined in bed with no c/o pain. Also of note, pt found finishing breakfast which pt was able to finish while preparing for PT session. Session focused on functional transfers and amb quality to promote safe home access. Pt initiated session with independent transfer to EOB followed by set up assist to donn shoes. Pt then amb to toilet and later EOB using CGA + RW. Pt used same assist level for hand washing. Pt then completed blocked practice amb endurance with emphasis on safety judgement for rest breaks. Pt completed 141ft + 12ft + 82ft amb with CGA + RW. Pt noted to have good safety on turns and good judgement of need to rest. Pt also noted to have increased R foot drag with fatigue. At end of session, pt was left seated in recliner with alarm engaged, nurse call bell and all needs in reach.     Therapy Documentation Precautions:  Precautions Precautions: Fall Restrictions Weight Bearing Restrictions: No Other Position/Activity Restrictions: Ataxia, impulsive, impaired problem-solving    Therapy/Group: Individual Therapy  Dionne Milo, PT, DPT 08/20/2022, 11:16 AM

## 2022-08-20 NOTE — Progress Notes (Signed)
PROGRESS NOTE   Subjective/Complaints:  No events overnight.  Patient complaining of some blurry/double vision today.  Stated to therapist that has been ongoing, however he did not want to complain about it.  Does not seem to have worsening overall vision in either eye.  Denies vertigo, nausea, or dizziness.  Some right beating horizontal nystagmus and superior gaze mild rotary nystagmus on exam; states he has never had BPPV.  PT requested team performed vestibular eval.  Patient stating headache was doing very well this morning until after therapies, started just at the end of therapies.  Overall, does feel like his current regimen is better.  He did utilize 2 PRNs today.  Informed by speech therapy that he is not cleared for Provell cup yet.  ROS:  + Adjustment difficulty with depressed mood -ongoing  +dysarthria c/b wet vocal quality-improved +HAs/postop head pain-ongoing, mildly improved +insomnia-improved +postop abd pain-resolved.  Denies fevers, chills, N/V, constipation, diarrhea, SOB, cough, chest pain, new weakness or paraesthesias.    Objective:   No results found. Recent Labs    08/18/22 0623  WBC 9.3  HGB 9.7*  HCT 30.1*  PLT 191     Recent Labs    08/18/22 0623  NA 137  K 3.8  CL 102  CO2 27  GLUCOSE 110*  BUN 15  CREATININE 1.06  CALCIUM 8.7*     Intake/Output Summary (Last 24 hours) at 08/20/2022 2029 Last data filed at 08/20/2022 1745 Gross per 24 hour  Intake 480 ml  Output 400 ml  Net 80 ml         Physical Exam: Vital Signs Blood pressure 138/64, pulse 72, temperature 98.4 F (36.9 C), resp. rate 16, height  (1.854 m), weight 88.4 kg, SpO2 100 %.    General: alert, sitting up in bed, no acute distress  HEENT:  R scalp  - shunt in place, staples intact.  PERRLA.  Right beating nystagmus, most apparent on leftward gaze.  Mild rotary nystagmus on superior gaze.  Visual fields  intact bilaterally.  Can track and converge normally with both eyes. Neck: Supple without JVD Heart: Reg rate and rhythm. No murmurs rubs or gallops Chest: CTA bilaterally without wheezes, rales, or rhonchi; no distress.  Abdomen: Soft, nonTTP, non-distended, bowel sounds positive. Multiple laparoscopic incisions with glue, c/d/i Extremities: No clubbing, cyanosis, or edema.   Psych: Pt's affect is appropriate . Pt is cooperative.   Skin: Clean and intact without signs of breakdown,  healed incision/scar to posterior neck, with fluctuant fluid-decreasing + staples over shunt site c/d/I -now.  Minimal bruising along shunt trajectory through the side of his neck. + Abdominal trochar sites well approximated with dermabond, c/d/I.  No tenderness to palpation  PRIOR EXAMS: Neuro: Alert and oriented x 4. + Right facial droop  + Speech apraxia and dysarthria -slowly improving  strength antigravity and against resistance in bilateral upper extremities and lower extremities Sensation intact light touch in all 4 extremities + Tender to palpation around the fluctuant fluid side and throughout the neck, bilateral temples, and forehead. -Ongoing + FTN  altered  improving daily-right now approximately same severity as left Musculoskeletal: Normal bulk and tone,  no joint swelling noted.  Moving all 4 limbs antigravity and against resistance.    Assessment/Plan: 1. Functional deficits which require 3+ hours per day of interdisciplinary therapy in a comprehensive inpatient rehab setting. Physiatrist is providing close team supervision and 24 hour management of active medical problems listed below. Physiatrist and rehab team continue to assess barriers to discharge/monitor patient progress toward functional and medical goals  Care Tool:  Bathing    Body parts bathed by patient: Right arm, Left arm, Chest, Front perineal area, Abdomen, Buttocks, Right upper leg, Left upper leg, Right lower leg, Left  lower leg, Face         Bathing assist Assist Level: Minimal Assistance - Patient > 75%     Upper Body Dressing/Undressing Upper body dressing   What is the patient wearing?: Pull over shirt    Upper body assist Assist Level: Supervision/Verbal cueing    Lower Body Dressing/Undressing Lower body dressing      What is the patient wearing?: Pants, Underwear/pull up     Lower body assist Assist for lower body dressing: Minimal Assistance - Patient > 75%     Toileting Toileting    Toileting assist Assist for toileting: Minimal Assistance - Patient > 75%     Transfers Chair/bed transfer  Transfers assist     Chair/bed transfer assist level: Contact Guard/Touching assist     Locomotion Ambulation   Ambulation assist      Assist level: Minimal Assistance - Patient > 75% Assistive device: Walker-rolling Max distance: 150   Walk 10 feet activity   Assist     Assist level: Minimal Assistance - Patient > 75% Assistive device: Walker-rolling   Walk 50 feet activity   Assist    Assist level: Minimal Assistance - Patient > 75% Assistive device: Walker-rolling    Walk 150 feet activity   Assist    Assist level: Minimal Assistance - Patient > 75% Assistive device: Walker-rolling    Walk 10 feet on uneven surface  activity   Assist     Assist level: Minimal Assistance - Patient > 75% Assistive device: Development worker, international aid     Assist Is the patient using a wheelchair?: Yes Type of Wheelchair: Manual    Wheelchair assist level: Minimal Assistance - Patient > 75% Max wheelchair distance: 150'    Wheelchair 50 feet with 2 turns activity    Assist        Assist Level: Minimal Assistance - Patient > 75%   Wheelchair 150 feet activity     Assist      Assist Level: Minimal Assistance - Patient > 75%   Blood pressure 138/64, pulse 72, temperature 98.4 F (36.9 C), resp. rate 16, height 6\' 1"  (1.854 m), weight  88.4 kg, SpO2 100 %.   Medical Problem List and Plan: 1. Functional deficits secondary to cerebellar CVA, c/b recent pseudomeningocele repair and Hx AVM resection December 2023..     - 4/4: Note Dr. Alanson Puls stating patient likely did, in fact, have a cerebellar stroke and not just post-operative changes as previously noted. Changing primary Dx to cerebellar infarct.           -patient may shower             -ELOS/Goals: 7-10 days, Mod I PT/OT/SLP; DC goal extended to 4/23             -Admit to CIR -Decadron as indicated 4 MG Q8 HOURS with TAPER STARTING 3/29 to 4/10 -  4/3: Worsening hydrocephalus on CT ->VP shunt placment 4/5 -> adjusted 4/8 to 1.0 with some subjective improvement in ataxia on exam  - 4/10: CT head for worsening headaches, stable.  Treatment as below.   - 4-16: Vestibular evaluation by PT pending  2.  Antithrombotics: -DVT/anticoagulation:  Pharmaceutical: Lovenox  QD -antiplatelet therapy: Aspirin 81 mg daily- currently held 08/09/22 d/t surgery, spoke with Dr. Conchita Paris 08/09/22, states it should restart in 1-2 days - > resume 4/9  3. Pain Management: Tylenol as needed, Diazepam  q8h PRN muscle spasms, Voltaren 1% 2g QID - 3/29: Schedule Tylenol 1000 mg TID, add gabapentin 300 mg QHS for headache and sleep -08/02/22 pt previously on Hydrocodone-APAP 5-325mg  at prior discharge, was still using this, would like to keep; reordered for now, 5-325mg  q6h PRN x7 days, if needed longer then weekday team to discuss; adjusted tylenol to  TID to avoid overdosing of tylenol; would also prefer gabapentin be scheduled BID rather than PRN -08/03/22 - 4/1 improved control, cont regimen for now -08/09/22 Hydrocodone 5-325mg  q6h PRN readded since he had surgery again yesterday; also added morphine  q4h PRN in case he needs IV pain control postoperatively; monitor closely -08/11/22 pain ongoing, monitor and convert to PO regimen in AM. For HA, Increase gabapentin from 300 mg BID to  300/300/600 -Sleep and headache improved with gabapentin, monitor -4/10: Headache worse, diffuse this a.m.  CT head stable.  Patient did not tolerate discontinuation of as needed IV morphine.  Scheduled Norco every 6, added Topamax 25 mg twice daily, reduce morphine to 2 mg IV twice daily as needed. -4/10: Maintain Topamax 25 mg twice daily, Norco scheduled.  Increase gabapentin to 600 mg 3 times daily, increase as needed morphine to 3 mg IV twice daily.  Goal to wean off IV off by the end of the week. -4/12: Increase topomax to 50 mg BID. Weaning morphine PRN over the weekend to 2 mg BID PRN 4/13, 1 mg BID PRN 4/14, then DC.   -4/13-14/24 pain managed for now, monitor with weaning morphine   - 4/15: Weaned off of morphine starting today, continue scheduled pain regimen with plan to wean down later this week. ?  Association with pressure on pseudomeningocele in the back of the head.  May benefit from a cervical collar, however this could also increase the pressure make it worse.  Will look at repositioning options today.  4/16: Switch gabapentin 600 mg 3 times daily to Lyrica 150 mg twice daily.  Read time 4 times daily Norco 5 mg to allow sleep from 2200 to 0600.  Added on at 2 additional PRNs of oxycodone 5 mg for pain control if needed with longer intervals during sleep.  Discontinue Topamax due to no benefit and possible effects on mood.  4-17: Improved, continue current regimen.  Using both PRNs.  Worsens with activity.  4. Mood/Behavior/Sleep: Zoloft 50 mg daily             -antipsychotic agents: N/A   -3/29: Gabapentin as above, melatonin  QHS PRN  -08/02/22 scheduled Gabapentin BID and melatonin QHS per request  -08/03/22 greatly improved sleep - continue  -4/2: Added trazodone 50 mg PRN for ?insomnia; not endorsed by patient.  -4/3: Patient endorses ongoing insomnia, uncertain etiology.  As needed trazodone not utilized, will schedule starting tonight. - sleep stable -08/09/22 slept poorly  but suspect could be from missing some meds yesterday and postop recovery; monitor for now -08/10/22 sleep gradually improving, monitor - 4/8: remains  poor d.t HA, pain; tx as above - 4/9: Endorses worsening depression, more labile mood secondary to frustration regarding his diagnoses.  Agreeable to increasing antidepressant sertraline from 50 to 75 mg.  Could also consider switching to BuSpar given some studies showing benefit for cerebellar ataxia. - 4/16: Patient endorses significant difficulty with adjustment and depression regarding perceived failure to progress, feeling like things will not get better.  Did encourage him regarding his daily functional games, and talked about stroke recovery extending up to a year.  No SI/HI.  Sertraline increased as above, will take a while to appreciate effects.  5. Neuropsych/cognition: This patient is capable of making decisions on his own behalf. 6. Skin/Wound Care: Routine skin checks 7. Dysphagia/Fluids/Electrolytes/Nutrition: Routine in and outs with follow-up chemistries  - 3/29: Cr stable, BUN increased, encourage PO fluids and re-test Monday--ordered weekly; stable CR, BUN improved  4/1; stable 4/3; stable 4/8 -08/02/22 takes Vitamin D 2000IU QD and Calcium supplement at home; reordered -08/10/22 pt dislikes diet order/purees and refuses to eat it, would prefer to go back to prior diet (bacon, eggs, mashed potatoes)-- not on SLP schedule today but spoke with speech therapy and asked if they could possibly do a quick eval to see if he can advance his diet today-- they will try to get to him today - 4.8: diet advanced to Dys 3; pt reports improved POS -4/10: Nutrition evaluated, meeting nutritional needs.  Supplements as needed.  Monitor -4/12: wet vocal quality and feel secretions interfering with POs; add loratidine 1 tab daily for drying -mildly improved, may remove later this week 4-15: Creatinine uptrending, although BUN remains stable and other  electrolytes look good.  Encourage p.o. fluids.  Retest Thursday.  - 4/16: Will discuss possibility of utilizing provale cup to help with p.o. intakes, as patient remains under supervision of fluid intakes and this is limiting his ability to drink.  Did have mildly improved p.o.'s today of 580 cc. 4-17: Eating 40 to 50% of meals, p.o.'s remain about 400 to 500 cc.  Unable to use Provale  Supervision only.  Discontinue loratadine, vocal quality improved.  8.  Hyperlipidemia.  Crestor 20mg  daily 9.  Rheumatoid arthritis.  Consider resuming home regimen of rituximab, methotrexate and sulfasalazine after steroids completed (4/10).  Currently on Dexamethasone with taper. -08/02/22 wife concerned with restarting of meds; advised to readdress this once steroids have tapered around 08/13/22 -4-10: Wife requesting resumption of rituximab, methotrexate, sulfasalazine.  Theoretical risk of increased post-operative infection, however on lit review appears minimal. Will get CBC, CMP in AM to ensure WBC count decreases in setting of completed steroid taper, then resume. - 4-11: No signs or symptoms of infection, white count downtrending after steroid treatment completed.  Will touch base with rheum (Dr. Corliss Skains) about restarting regimen; no direct contrainidications identified.  -4/12: Per rheumatology, can restart all immunologics 2 weeks post-op (4/19)   10.  CAD.  Continue low-dose aspirin. Denies CP 11.  Dysphagia. Regular diet with nectar liquids. Follow up SLP 12.  Chronic systolic congestive heart failure.  Monitor for any signs of fluid overload. Daily weight. Currently HOLDING Spironolactone 12.5mg  QD (halved during last hospital stay), Lisinopril 10mg  QD, Metoprolol 25mg  QD  -3/30-4/5 wt stable, BP stable, continue to monitor -08/09/22 wt stable, BPs soft overnight but somewhat close to prior, improved this morning; monitor closely tonight, may need gentle hydration, hold off on labs for now -4/7-11/24 BPs  stable monitor -4/11: Weights downtrending. BP stable. Monitor.  -4/12: mild hypotension  today, asymptomatic. PO fluids poor d/t thickened liquids. BMP for AM. Encourage fludis -08/16/22 wt stable, BP still soft but stable, BMP unremarkable; monitor -08/17/22 wt/BP stable, cont to monitor  - 4/15: Intermittent hypotension, encourage p.o. fluids.  Patient endorses he is asymptomatic, requests DC IV today. 08-19-15: Blood pressures improved with better p.o. intakes, monitor Filed Weights   08/17/22 0600 08/18/22 0500 08/19/22 0541  Weight: 89.1 kg 88.8 kg 88.4 kg   Vitals:   08/17/22 2014 08/18/22 0439 08/18/22 1313 08/18/22 1508  BP: 114/84 (!) 94/59 134/79 113/85   08/18/22 1511 08/18/22 1927 08/19/22 0543 08/19/22 1321  BP: 116/83 110/79 110/62 104/70   08/19/22 1935 08/20/22 0427 08/20/22 1403 08/20/22 1937  BP: 103/63 105/78 102/75 138/64     13.  OSA. Intolerance to CPAP. Counseling on CPAP benefits.  14. Prediabetes. A1C 6.0. Monitor on CBC/CMP 15. Leukocytosis. Likely due to steroids. Stable. Monitor for s/s infection.    - 10.2->11->10.6 -> 12 4/3 ; monitor   - 4/8: 12.9 -> 11 4/11-.  9.3 4-15, normal  16. Macular degeneration: takes Preservision AREDS2 and Systane 0.6% eye drops at home -08/02/22 ordered hospital formulary alternatives- Lacrilube BID and Prosight multivitamin QD - 4/1-4/2: Wife concerned regarding use of alternative to systane; recommended bringing in from home  - 4/4: Pharmacy to assist in nonformulary order for home systane; ordered  17. R wrist bruising - stable, improving -08/02/22 wife concerned with bruises to R wrist, appear older/healing, no tenderness or crepitus/deformities, no swelling. Suspect minor pressure related bruise, doubt underlying bony injury, doubt need for xray; likely related to blood thinners; monitor for now  - 4/2: Mild HgB drop 10.6->9.8-> 10.1 -> 9.9 post-op  18.  Hydrocephalus s/p AVM resection and pseudomeningocele repair. Can  remove staples 4/19.   - 4/3: Cognitive slowing, increased dysarthria and ataxia.  See above, CT head and labs today for workup.  No focal weakness or sensory changes, no gross cranial nerve deficits to indicate need for acute stroke workup.  Last known normal would have been at bedtime last night. - 4/4: CT head concerning for worsening hydrocephalus. See above.  - 4/5:  Dr. Alanson Puls and Dr. Jake Samples recommending VP shunt, family agreeable, plan to OR 4/5 and return to IPR if no complications -08/09/22 VP shunt placed last night, doing well, some mild abd discomfort but nondistended and no exquisite tenderness; pain control as above - 4.8: pending neurosurgery eval, per note may consider decreasing Strata II valve setting tomorrow to 1.0 or 0.5-adjusted to 1.0, with improvements - 4/10: CT head stable; Tx headache as above. Discussed with wife that imaging may take some time post-op to improve.  - 4/12: If no significant improvement by Monday, will ask for Dr. Conchita Paris to re-evaluate shunt settings (currently on 1.0) -4/15 -significant improvement in ataxia in right upper extremity and voice today.  Pseudomeningocele appears to be decreasing, continue to monitor. 4-16: Dr. Conchita Paris evaluated shunt overnight, no new recommendations.    LOS: 20 days A FACE TO FACE EVALUATION WAS PERFORMED  Angelina Sheriff 08/20/2022, 8:29 PM

## 2022-08-20 NOTE — Progress Notes (Addendum)
Patient ID: Christopher Yu, male   DOB: 11/07/1960, 62 y.o.   MRN: [id number]  SW received phone call from wife to confirm family edu for Friday (4/19) 8am-11am.   SW faxed referral for outpatient PT/OT/SLP for [facility] ([phone]/[phone]).  Cecile Sheerer, MSW, LCSWA Office: [phone] Cell: [phone] Fax: [phone]

## 2022-08-21 LAB — GLUCOSE, CAPILLARY: Glucose-Capillary: 96 mg/dL (ref 70–99)

## 2022-08-21 NOTE — Progress Notes (Signed)
Physical Therapy Session Note  Patient Details  Name: Christopher Yu MRN: [id number] Date of Birth: 02-12-1961  Today's Date: 08/21/2022 PT Individual Time: 1115-1200 PT Individual Time Calculation (min): 45 min   Short Term Goals: Week 2:  PT Short Term Goal 1 (Week 2): Patient will perform bed/chair transfer with LRAD and CGA PT Short Term Goal 1 - Progress (Week 2): Met PT Short Term Goal 2 (Week 2): Patient will ambulate >45' with LRAD and CGA PT Short Term Goal 2 - Progress (Week 2): Met PT Short Term Goal 3 (Week 2): Patient will ascend/descend > a flight of stairs with R HR and CGA PT Short Term Goal 3 - Progress (Week 2): Met Week 3:  PT Short Term Goal 1 (Week 3): STGs=LTGs secondary to ELOS  Skilled Therapeutic Interventions/Progress Updates:  Patient greeted sitting upright in wheelchair in room and agreeable to PT treatment session. Patient wheeled to rehab gym for time management and energy conservation.   Patient performed various sit/stands and transfers with RW and CGA overall- Improved sequencing and overall safety awareness noted with no LOB.   Patient gait trained x180' with RW (8# still on walker) and CGA- Patient with improved safety awareness and stability especially when turning throughout gait trials.   Patient gait trained x260' with RW (weights taken off the walker) and CGA/MinA- Patient with minor impairments in stability without the increased stability from the weights on his walker, however with increased repetition patient was able to demonstrate improvements.   Patient tasked with weaving between x8 cones with RW and CGA- Patient performed x2 trials total and was able to complete without knocking over a single cone.   Patient tasked with ambulating throughout the hallway with the RW and CGA while locating 5 various colored bean bags- Patient required Mod assist for locating bean bags secondary to poor visual scanning. VC for improved safety  awareness and stepping close toward the item prior to taking his hand off the walker.   Patient returned to his room where he performed stand pivot transfer without the use of an AD and CGA. Patient left supine in bed with call bell within reach, bed alarm on and all needs met.    Therapy Documentation Precautions:  Precautions Precautions: Fall Restrictions Weight Bearing Restrictions: No Other Position/Activity Restrictions: Ataxia, impulsive, impaired problem-solving   Therapy/Group: Individual Therapy  Edwar Coe 08/21/2022, 8:06 AM

## 2022-08-21 NOTE — Progress Notes (Signed)
Speech Language Pathology Weekly Progress and Session Note  Patient Details  Name: Seif Teichert MRN: [id number] Date of Birth: 10/14/1960  Beginning of progress report period: August 14, 2022 End of progress report period: August 21, 2022  Today's Date: 08/21/2022 SLP Individual Time: 4098-1191 SLP Individual Time Calculation (min): 43 min  Short Term Goals: Week 3: SLP Short Term Goal 1 (Week 3): Patient will self-monitor and correct wet vocal quality with mod verbal cues. SLP Short Term Goal 1 - Progress (Week 3): Met SLP Short Term Goal 2 (Week 3): Patient will self-monitor and correct communication breakdowns with Mod verbal cues. SLP Short Term Goal 2 - Progress (Week 3): Met SLP Short Term Goal 3 (Week 3): Patient will utilize diaphragmtic breathing at the short phrase level during structured tasks to achieve ~80% intelligibility with Mod verbal and visual cues. SLP Short Term Goal 3 - Progress (Week 3): Met SLP Short Term Goal 4 (Week 3): Patient will demonstrate sustained attention to functional tasks for 45 minutes with min verbal cues for redirection. SLP Short Term Goal 4 - Progress (Week 3): Met SLP Short Term Goal 5 (Week 3): Patient will consume curent diet with Min verbal cues for use of swallowing compensatory strategies. SLP Short Term Goal 5 - Progress (Week 3): Met    New Short Term Goals: Week 4: SLP Short Term Goal 1 (Week 4): STGs=LTGs due to ELOS  Weekly Progress Updates: Patient has made functional gains and has met 5 of 5 STGs this reporting period. Currently, patient is consuming regular textures with nectar-thick liquids with supervision-Min A verbal cues for use of swallowing compensatory strategies. Patient with repeat MBS this week which showed silent aspiration with thin liquids that was reduced with a head turn to the left with a strong cough after the swallow. It was recommended for patient to continue current diet with trials of thin at the  beside with SLP with focus on utilization of strategies prior to upgrade. Patient's overall speech intelligibility appears improved with patient requiring Mod A multimodal cues for use of diaphragmatic breathing as well as over-articulation and a slow rate to maximize intelligibility to 75-80% at the phrase level. Min-Mod verbal cues are needed for attention  and emergent awareness, especially when self-monitoring and correcting communication breakdown as well as his intermittent wet vocal quality. Patient and family education ongoing. Patient would benefit from continued skilled SLP intervention to maximize his cognitive and swallowing function as well as his functional communication prior to discharge.      Intensity: Minumum of 1-2 x/day, 30 to 90 minutes Frequency: 1 to 3 out of 7 days Duration/Length of Stay: 08/26/22 Treatment/Interventions: Cognitive remediation/compensation;Cueing hierarchy;Dysphagia/aspiration precaution training;Environmental controls;Functional tasks;Internal/external aids;Speech/Language facilitation;Patient/family education;Therapeutic Activities   Daily Session  Skilled Therapeutic Interventions:  Skilled treatment session focused on dysphagia and communication goals. SLP facilitated session by providing trials of thin liquids via tsp. Patient without overt s/s of aspiration (silent on MBS) and required intermittent supervision verbal cues for use of a complete head turn to the left. Patient produced a volitional strong cough after each sip independently. Recommend patient initiate the water protocol via tsp with focus on utilization of strategies. Patient verbalized understanding and agreement regarding policies and procedures for the water protocol. SLP also facilitated session by providing overall Min A verbal cues for use of speech intelligibility strategies at the sentence level during an informal conversation with patient achieving ~80-90% intelligibility. Patient with  increased ability to self-monitor and correct his wet  vocal quality throughout the session as well as repairing communication breakdowns. Patient left upright in wheelchair with alarm on and all needs within reach. Continue with current plan of care.    Pain No/Denies Pain   Therapy/Group: Individual Therapy  Ariele Vidrio 08/21/2022, 6:32 AM

## 2022-08-21 NOTE — Progress Notes (Addendum)
PROGRESS NOTE   Subjective/Complaints:  No events overnight. Working with SLP today, being cleared for free water protocol to assist with PO intakes.   C/o interrupted sleep at 1-2 am for scheduled norco (per chart around 5:30 am); unable to fall back asleep after.   ROS:  + Adjustment difficulty with depressed mood -ongoing  +dysarthria c/b wet vocal quality-improved +HAs/postop head pain-ongoing, imprioving +insomnia-worsened with nighttime meds +postop abd pain-resolved.  Denies fevers, chills, N/V, constipation, diarrhea, SOB, cough, chest pain, new weakness or paraesthesias.    Objective:   No results found. No results for input(s): "WBC", "HGB", "HCT", "PLT" in the last 72 hours.   No results for input(s): "NA", "K", "CL", "CO2", "GLUCOSE", "BUN", "CREATININE", "CALCIUM" in the last 72 hours.   Intake/Output Summary (Last 24 hours) at 08/21/2022 1154 Last data filed at 08/21/2022 0700 Gross per 24 hour  Intake 476 ml  Output --  Net 476 ml         Physical Exam: Vital Signs Blood pressure (!) 103/53, pulse 72, temperature 99 F (37.2 C), resp. rate 16, height  (1.854 m), weight 88.6 kg, SpO2 100 %.    General: alert, sitting up in bed, no acute distress  HEENT:  R scalp  - shunt in place, staples intact.  PERRLA.   4/17: Right beating nystagmus, most apparent on leftward gaze.  Mild rotary nystagmus on superior gaze.  Visual fields intact bilaterally.  Can track and converge normally with both eyes.  Neck: Supple without JVD Heart: Reg rate and rhythm. No murmurs rubs or gallops Chest: CTA bilaterally without wheezes, rales, or rhonchi; no distress.  Abdomen: Soft, nonTTP, non-distended, bowel sounds positive. Multiple laparoscopic incisions with glue, c/d/i Extremities: No clubbing, cyanosis, or edema.   Psych: Pt's affect is appropriate . Pt is cooperative.   Skin: Clean and intact without signs  of breakdown,  healed incision/scar to posterior neck, with fluctuant fluid-stable  + staples over shunt site c/d/I -now.  Minimal bruising along shunt trajectory through the side of his neck. + Abdominal trochar sites well approximated with dermabond, c/d/I.  No tenderness to palpation Neuro: Alert and oriented x 4. + Right facial droop  + Speech apraxia and dysarthria -slowly improving  strength antigravity and against resistance in bilateral upper extremities and lower extremities Sensation intact light touch in all 4 extremities + Tender to palpation around the fluctuant fluid side and throughout the neck, bilateral temples, and forehead. -improved tolerance for palpation + FTN  altered  improving daily-right now approximately same severity as left Musculoskeletal: Normal bulk and tone, no joint swelling noted.  Moving all 4 limbs antigravity and against resistance.    Assessment/Plan: 1. Functional deficits which require 3+ hours per day of interdisciplinary therapy in a comprehensive inpatient rehab setting. Physiatrist is providing close team supervision and 24 hour management of active medical problems listed below. Physiatrist and rehab team continue to assess barriers to discharge/monitor patient progress toward functional and medical goals  Care Tool:  Bathing    Body parts bathed by patient: Right arm, Left arm, Chest, Front perineal area, Abdomen, Buttocks, Right upper leg, Left upper leg, Right lower leg, Left  lower leg, Face         Bathing assist Assist Level: Minimal Assistance - Patient > 75%     Upper Body Dressing/Undressing Upper body dressing   What is the patient wearing?: Pull over shirt    Upper body assist Assist Level: Supervision/Verbal cueing    Lower Body Dressing/Undressing Lower body dressing      What is the patient wearing?: Pants, Underwear/pull up     Lower body assist Assist for lower body dressing: Minimal Assistance - Patient > 75%      Toileting Toileting    Toileting assist Assist for toileting: Minimal Assistance - Patient > 75%     Transfers Chair/bed transfer  Transfers assist     Chair/bed transfer assist level: Contact Guard/Touching assist     Locomotion Ambulation   Ambulation assist      Assist level: Minimal Assistance - Patient > 75% Assistive device: Walker-rolling Max distance: 150   Walk 10 feet activity   Assist     Assist level: Minimal Assistance - Patient > 75% Assistive device: Walker-rolling   Walk 50 feet activity   Assist    Assist level: Minimal Assistance - Patient > 75% Assistive device: Walker-rolling    Walk 150 feet activity   Assist    Assist level: Minimal Assistance - Patient > 75% Assistive device: Walker-rolling    Walk 10 feet on uneven surface  activity   Assist     Assist level: Minimal Assistance - Patient > 75% Assistive device: Development worker, international aid     Assist Is the patient using a wheelchair?: Yes Type of Wheelchair: Manual    Wheelchair assist level: Minimal Assistance - Patient > 75% Max wheelchair distance: 150'    Wheelchair 50 feet with 2 turns activity    Assist        Assist Level: Minimal Assistance - Patient > 75%   Wheelchair 150 feet activity     Assist      Assist Level: Minimal Assistance - Patient > 75%   Blood pressure (!) 103/53, pulse 72, temperature 99 F (37.2 C), resp. rate 16, height 6\' 1"  (1.854 m), weight 88.6 kg, SpO2 100 %.   Medical Problem List and Plan: 1. Functional deficits secondary to cerebellar CVA, c/b recent pseudomeningocele repair and Hx AVM resection December 2023..     - 4/4: Note Dr. Alanson Puls stating patient likely did, in fact, have a cerebellar stroke and not just post-operative changes as previously noted. Changing primary Dx to cerebellar infarct.           -patient may shower             -ELOS/Goals: 7-10 days, Mod I PT/OT/SLP; DC goal extended  to 4/23             -Continue CIR -Decadron as indicated 4 MG Q8 HOURS with TAPER STARTING 3/29 to 4/10 - 4/3: Worsening hydrocephalus on CT ->VP shunt placment 4/5 -> adjusted 4/8 to 1.0 with some subjective improvement in ataxia on exam  - 4/10: CT head for worsening headaches, stable.  Treatment as below.   - 4-16: Vestibular evaluation by PT pending  2.  Antithrombotics: -DVT/anticoagulation:  Pharmaceutical: Lovenox 40mg  QD -antiplatelet therapy: Aspirin 81 mg daily- currently held 08/09/22 d/t surgery, spoke with Dr. Conchita Paris 08/09/22, states it should restart in 1-2 days - > resume 4/9  3. Pain Management: Tylenol as needed, Diazepam 5mg  q8h PRN muscle spasms, Voltaren 1% 2g QID - 3/29: Schedule  Tylenol 1000 mg TID, add gabapentin 300 mg QHS for headache and sleep -08/02/22 pt previously on Hydrocodone-APAP 5-325mg  at prior discharge, was still using this, would like to keep; reordered for now, 5-325mg  q6h PRN x7 days, if needed longer then weekday team to discuss; adjusted tylenol to  TID to avoid overdosing of tylenol; would also prefer gabapentin be scheduled BID rather than PRN -08/03/22 - 4/1 improved control, cont regimen for now -08/09/22 Hydrocodone 5-325mg  q6h PRN readded since he had surgery again yesterday; also added morphine  q4h PRN in case he needs IV pain control postoperatively; monitor closely -08/11/22 pain ongoing, monitor and convert to PO regimen in AM. For HA, Increase gabapentin from 300 mg BID to 300/300/600 -Sleep and headache improved with gabapentin, monitor -4/10: Headache worse, diffuse this a.m.  CT head stable.  Patient did not tolerate discontinuation of as needed IV morphine.  Scheduled Norco every 6, added Topamax 25 mg twice daily, reduce morphine to 2 mg IV twice daily as needed. -4/10: Maintain Topamax 25 mg twice daily, Norco scheduled.  Increase gabapentin to 600 mg 3 times daily, increase as needed morphine to 3 mg IV twice daily.  Goal to wean off IV  off by the end of the week. -4/12: Increase topomax to 50 mg BID. Weaning morphine PRN over the weekend to 2 mg BID PRN 4/13, 1 mg BID PRN 4/14, then DC.   -4/13-14/24 pain managed for now, monitor with weaning morphine   - 4/15: Weaned off of morphine starting today, continue scheduled pain regimen with plan to wean down later this week. ?  Association with pressure on pseudomeningocele in the back of the head.  May benefit from a cervical collar, however this could also increase the pressure make it worse.  Will look at repositioning options today.  4/16: Switch gabapentin 600 mg 3 times daily to Lyrica 150 mg twice daily.  Read time 4 times daily Norco 5 mg to allow sleep from 2200 to 0600.  Added on at 2 additional PRNs of oxycodone 5 mg for pain control if needed with longer intervals during sleep.  Discontinue Topamax due to no benefit and possible effects on mood.  4-17: Improved, continue current regimen.  Using both PRNs.  Worsens with activity.  4/18: Spoke with nursing regarding nighttime interruptions for scheduled medications. DC staples today.   4. Mood/Behavior/Sleep: Zoloft 50 mg daily             -antipsychotic agents: N/A   -3/29: Gabapentin as above, melatonin  QHS PRN  -08/02/22 scheduled Gabapentin BID and melatonin QHS per request  -08/03/22 greatly improved sleep - continue  -4/2: Added trazodone 50 mg PRN for ?insomnia; not endorsed by patient.  -4/3: Patient endorses ongoing insomnia, uncertain etiology.  As needed trazodone not utilized, will schedule starting tonight. - sleep stable -08/09/22 slept poorly but suspect could be from missing some meds yesterday and postop recovery; monitor for now -08/10/22 sleep gradually improving, monitor - 4/8: remains poor d.t HA, pain; tx as above - 4/9: Endorses worsening depression, more labile mood secondary to frustration regarding his diagnoses.  Agreeable to increasing antidepressant sertraline from 50 to 75 mg.  Could also consider  switching to BuSpar given some studies showing benefit for cerebellar ataxia. - 4/16: Patient endorses significant difficulty with adjustment and depression regarding perceived failure to progress, feeling like things will not get better.  Did encourage him regarding his daily functional games, and talked about stroke recovery extending up to  a year.  No SI/HI.  Sertraline increased as above, will take a while to appreciate effects.  5. Neuropsych/cognition: This patient is capable of making decisions on his own behalf. 6. Skin/Wound Care: Routine skin checks 7. Dysphagia/Fluids/Electrolytes/Nutrition: Routine in and outs with follow-up chemistries  - 3/29: Cr stable, BUN increased, encourage PO fluids and re-test Monday--ordered weekly; stable CR, BUN improved  4/1; stable 4/3; stable 4/8 -08/02/22 takes Vitamin D 2000IU QD and Calcium supplement at home; reordered -08/10/22 pt dislikes diet order/purees and refuses to eat it, would prefer to go back to prior diet (bacon, eggs, mashed potatoes)-- not on SLP schedule today but spoke with speech therapy and asked if they could possibly do a quick eval to see if he can advance his diet today-- they will try to get to him today - 4.8: diet advanced to Dys 3; pt reports improved POS -4/10: Nutrition evaluated, meeting nutritional needs.  Supplements as needed.  Monitor -4/12: wet vocal quality and feel secretions interfering with POs; add loratidine 1 tab daily for drying -mildly improved, may remove later this week 4/15: Creatinine uptrending, although BUN remains stable and other electrolytes look good.  Encourage p.o. fluids.  Retest Thursday.  - 4/16: Will discuss possibility of utilizing provale cup to help with p.o. intakes, as patient remains under supervision of fluid intakes and this is limiting his ability to drink.  Did have mildly improved p.o.'s today of 580 cc. 4/17: Eating 40 to 50% of meals, p.o.'s remain about 400 to 500 cc.  Unable to use  Provale  Supervision only.  Discontinue loratadine, vocal quality improved. 4/18: Advanced to free water protocol  8.  Hyperlipidemia.  Crestor 20mg  daily 9.  Rheumatoid arthritis.  Consider resuming home regimen of rituximab, methotrexate and sulfasalazine after steroids completed (4/10).  Currently on Dexamethasone with taper. -08/02/22 wife concerned with restarting of meds; advised to readdress this once steroids have tapered around 08/13/22 -4-10: Wife requesting resumption of rituximab, methotrexate, sulfasalazine.  Theoretical risk of increased post-operative infection, however on lit review appears minimal. Will get CBC, CMP in AM to ensure WBC count decreases in setting of completed steroid taper, then resume. - 4-11: No signs or symptoms of infection, white count downtrending after steroid treatment completed.  Will touch base with rheum (Dr. Corliss Skains) about restarting regimen; no direct contrainidications identified.  -4/12: Per rheumatology, can restart all immunologics 2 weeks post-op (4/19)   10.  CAD.  Continue low-dose aspirin. Denies CP 11.  Dysphagia. Regular diet with nectar liquids. Follow up SLP 12.  Chronic systolic congestive heart failure.  Monitor for any signs of fluid overload. Daily weight. Currently HOLDING Spironolactone 12.5mg  QD (halved during last hospital stay), Lisinopril 10mg  QD, Metoprolol 25mg  QD  -3/30-4/5 wt stable, BP stable, continue to monitor -08/09/22 wt stable, BPs soft overnight but somewhat close to prior, improved this morning; monitor closely tonight, may need gentle hydration, hold off on labs for now -4/7-11/24 BPs stable monitor -4/11: Weights downtrending. BP stable. Monitor.  -4/12: mild hypotension today, asymptomatic. PO fluids poor d/t thickened liquids. BMP for AM. Encourage fludis -08/16/22 wt stable, BP still soft but stable, BMP unremarkable; monitor -08/17/22 wt/BP stable, cont to monitor  - 4/15: Intermittent hypotension, encourage p.o.  fluids.  Patient endorses he is asymptomatic, requests DC IV today. 08-18-16: Blood pressures improved with better p.o. intakes, monitor Filed Weights   08/18/22 0500 08/19/22 0541 08/21/22 0500  Weight: 88.8 kg 88.4 kg 88.6 kg   Vitals:  08/18/22 0439 08/18/22 1313 08/18/22 1508 08/18/22 1511  BP: (!) 94/59 134/79 113/85 116/83   08/18/22 1927 08/19/22 0543 08/19/22 1321 08/19/22 1935  BP: 110/79 110/62 104/70 103/63   08/20/22 0427 08/20/22 1403 08/20/22 1937 08/21/22 0514  BP: 105/78 102/75 138/64 (!) 103/53     13.  OSA. Intolerance to CPAP. Counseling on CPAP benefits.  14. Prediabetes. A1C 6.0. Monitor on CBC/CMP 15. Leukocytosis. Likely due to steroids. Stable. Monitor for s/s infection.    - 10.2->11->10.6 -> 12 4/3 ; monitor   - 4/8: 12.9 -> 11 4/11-.  9.3 4-15, normal  16. Macular degeneration: takes Preservision AREDS2 and Systane 0.6% eye drops at home -08/02/22 ordered hospital formulary alternatives- Lacrilube BID and Prosight multivitamin QD - 4/1-4/2: Wife concerned regarding use of alternative to systane; recommended bringing in from home  - 4/4: Pharmacy to assist in nonformulary order for home systane; ordered  17. R wrist bruising - stable, improving -08/02/22 wife concerned with bruises to R wrist, appear older/healing, no tenderness or crepitus/deformities, no swelling. Suspect minor pressure related bruise, doubt underlying bony injury, doubt need for xray; likely related to blood thinners; monitor for now  - 4/2: Mild HgB drop 10.6->9.8-> 10.1 -> 9.9 post-op  18.  Hydrocephalus s/p AVM resection and pseudomeningocele repair. Can remove staples today.   - 4/3: Cognitive slowing, increased dysarthria and ataxia.  See above, CT head and labs today for workup.  No focal weakness or sensory changes, no gross cranial nerve deficits to indicate need for acute stroke workup.  Last known normal would have been at bedtime last night. - 4/4: CT head concerning for  worsening hydrocephalus. See above.  - 4/5:  Dr. Alanson Puls and Dr. Jake Samples recommending VP shunt, family agreeable, plan to OR 4/5 and return to [facility] if no complications -08/09/22 VP shunt placed last night, doing well, some mild abd discomfort but nondistended and no exquisite tenderness; pain control as above - 4.8: pending neurosurgery eval, per note may consider decreasing Strata II valve setting tomorrow to 1.0 or 0.5-adjusted to 1.0, with improvements - 4/10: CT head stable; Tx headache as above. Discussed with wife that imaging may take some time post-op to improve.  - 4/12: If no significant improvement by Monday, will ask for Dr. Conchita Paris to re-evaluate shunt settings (currently on 1.0) -4/15 -significant improvement in ataxia in right upper extremity and voice today.  Pseudomeningocele appears to be decreasing, continue to monitor. 4-16: Dr. Conchita Paris evaluated shunt overnight, no new recommendations.    LOS: 21 days A FACE TO FACE EVALUATION WAS PERFORMED  Angelina Sheriff 08/21/2022, 11:54 AM

## 2022-08-21 NOTE — Progress Notes (Signed)
Physical Therapy Session Note  Patient Details  Name: Christopher Yu MRN: [id number] Date of Birth: 10-20-1960  Today's Date: 08/21/2022 PT Individual Time: 5053-9767 PT Individual Time Calculation (min): 59 min   Short Term Goals: Week 3:  PT Short Term Goal 1 (Week 3): STGs=LTGs secondary to ELOS  Skilled Therapeutic Interventions/Progress Updates:    Pt received supine in bed, asleep with his wife present - pt easily awakens and is agreeable to therapy session with plan for vestibular evaluation per MD request.   Supine>sitting R EOB, HOB flat and not using bedrails, with supervision.  Pt denies any instances of feeling like the room is spinning and reports he has a hx of feeling "dizzy" upon initially standing up with pt/wife reporting likely due to orthostatic hypotension. However, no other instances of "dizziness" during CIR stay. Pt did confirm he had 1x instances of diplopia lasting <90minute where he was able to utilize a heavy, blink to "reset" his eye positioning and correct his visual focus with no additional episodes of double vision.  Performed Oculomotor and Vestibular assessment:   Static forward gaze: pt with eyes in alignment together, at rest, looking straight ahead; no significant head tilt or rotation and reasonably in midline  Smooth pursuits: overall decreased coordination/smoothness of eye movements and often not keeping up with the target as it moves (lagging behind or guessing and moving eyes ahead of the target); also pt with poor awareness of when his eyes are not both aligned and moving with the target  Saccades: horizontal: noticed primarily when moving towards R that the R eye does not move laterally at same speed/timing as L eye; vertical: notice when looking down both eyes deviate towards L having to then perform corrective movement to re-center to midline on the target  Cervical ROM: WFL for testing   VOR Cancellation Test: corrective saccades  when turning head towards L due to delay in eye movements to maintain vision on the target (this is more clearly seen in R eye compared to L)  Initiated plan to perform head impulse test (HIT); however, pt unable to relax neck sufficiently enough to allow therapist to even rotate his head R/L without him overpowering the movements despite a few attempts; therefore, unable to safely attempt HIT or head shaking nystagmus (HSN).  Based on pt's description of his symptoms and his answers to questions that could indicate BPPV, anticipate this is unlikely; therefore did not perform peripheral vestibular testing.  Based on pt's oculomotor exam, anticipate his symptoms are from central origin and he would benefit from progressively more challenging oculomotor interventions to improve the coordination of his eye movements and gradual integration of this into functional tasks. Greatest challenge will be pt's poor awareness of when his eyes are not aligned together on the target. Notified MD of recommendation for follow-up OPPT to address oculomotor-vestibular impairments.  Provided pt with the following exercises and had pt perform 1 set of each to ensure understanding and trained his wife on observing his eye movements to help cue for corrections as needed due to his challenge of being unaware when his eyes are not aligned on target.  Access Code: [email or web address] URL: [email or web address] Date: 08/21/2022 Prepared by: Casimiro Needle  Exercises - Seated Gaze Stabilization with Head Nod  - 1 x daily - 7 x weekly - 2 sets - 10 reps - Seated Horizontal Smooth Pursuit  - 1 x daily - 7 x weekly - 2 sets - 10 reps - Seated VOR Cancellation  -  1 x daily - 7 x weekly - 2 sets - 10 reps   At end of session, pt left seated on EOB with his wife and nurse present to assume care of pt.  Therapy Documentation Precautions:  Precautions Precautions: Fall Restrictions Weight Bearing Restrictions: No Other  Position/Activity Restrictions: Ataxia, impulsive, impaired problem-solving   Pain:  No reports of pain throughout session.    Therapy/Group: Individual Therapy  Ginny Forth , PT, DPT, NCS, CSRS 08/21/2022, 1:00 PM

## 2022-08-21 NOTE — Progress Notes (Signed)
Occupational Therapy Weekly Progress Note  Patient Details  Name: Christopher Yu MRN: [id number] Date of Birth: [birth year]  Beginning of progress report period: August 01, 2022 End of progress report period: August 21, 2022  Today's Date: 08/21/2022 OT Individual Time: 5883-2549 OT Individual Time Calculation (min): 60 min    Patient has met 3 of 3 short term goals.  Patient is making steady progress towards OT goals. Functional ambulation to perform bathroom transfers with CGA and RW. His coordination has greatly improved within functional tasks with improved smoothness and accuracy wit all fine motor tasks. Continue current POC.  Patient continues to demonstrate the following deficits: muscle weakness, impaired timing and sequencing, unbalanced muscle activation, ataxia, decreased coordination, and decreased motor planning, decreased awareness, decreased problem solving, and decreased safety awareness, and decreased standing balance, decreased postural control, hemiplegia, and decreased balance strategies and therefore will continue to benefit from skilled OT intervention to enhance overall performance with BADL, iADL, and functional use of B UE's .  Patient progressing toward long term goals..  Continue plan of care.  OT Short Term Goals Week 3:  OT Short Term Goal 1 (Week 3): Patient will demonstrate increased snmoothness and accuracy by slowing movements down while bathing with a wash cloth OT Short Term Goal 1 - Progress (Week 3): Met OT Short Term Goal 2 (Week 3): Patient will maintain dynamic standing balance without a device when reaching outside base of support and min A OT Short Term Goal 2 - Progress (Week 3): Met OT Short Term Goal 3 (Week 3): Patient will don and doff toothpaste lid without dropping it on 2/3 trials OT Short Term Goal 3 - Progress (Week 3): Met Week 4:  OT Short Term Goal 1 (Week 4): LTG=STG 2.2 ELOS  Skilled Therapeutic Interventions/Progress  Updates:   Pt greeted semi-reclined in bed and agreeable to OT treatment session. Pt had not eaten breakfast yet and requested coffee. Pt completed bed mobility mod I and donned shoes at EOB w/ set-up A. Stand-pivot to wc with CGA. Nursing entered to administer meds while OT got pt thickened coffee. Pt consumed meal with improved coordination of feeding utensils. Pt with some coughing episodes when drinking nectar thick liquids with cues to slow down sips as pt likes to chug. Pt brought to therapy gyn and completed stand-pivot to therapy mat. Worked on eye convergence using [employer] and scanning exercises. Standing balance/endurance with standing UB there-ex using 3 lb dowel rod. 3 sets of 10 chest press, bicep curl, and overhead press. Pt returned to room and left seated in wc with alarm belt on, call bell in reach, and needs met.    Therapy Documentation Precautions:  Precautions Precautions: Fall Restrictions Weight Bearing Restrictions: No Other Position/Activity Restrictions: Ataxia, impulsive, impaired problem-solving Pain: Pain Assessment Pain Scale: 0-10 Pain Score: 6  Pain Type: Acute pain Pain Location: Head Pain Orientation: Posterior Pain Descriptors / Indicators: Aching Pain Frequency: Constant Pain Onset: On-going Patients Stated Pain Goal: 4 Pain Intervention(s): Medication (See eMAR) \   Therapy/Group: Individual Therapy  Mal Amabile 08/21/2022, 8:02 AM

## 2022-08-21 NOTE — Discharge Summary (Signed)
Physician Discharge Summary  Patient ID: Christopher Yu MRN: [id number] DOB/AGE: 1960-06-13 62 y.o.  Admit date: 07/31/2022 Discharge date: 08/26/2022  Discharge Diagnoses:  Principal Problem:   Cerebellar stroke Active Problems:   Pseudomeningocele   Adjustment disorder DVT prophylaxis Pain management Dysphagia Mood stabilization Hyperlipidemia Rheumatoid arthritis CAD Chronic systolic congestive heart failure OSA with intolerance to CPAP Prediabetes Macular degeneration  Discharged Condition: Stable  Significant Diagnostic Studies: DG Swallowing Func-Speech Pathology  Result Date: 08/18/2022 Table formatting from the original result was not included. Modified Barium Swallow Study Patient Details Name: Christopher Yu MRN: [id number] Date of Birth: [birth year] Today's Date: 08/18/2022 HPI/PMH: HPI: 62 y.o. male presents to [facility] hospital on 07/28/2022 with slurred speech, worsening gait and HA. Pt recently discharged 07/25/2022 after operative repair of pseudomeningiocele. MRI brain 3/25 demonstrates acute R cerebellar infarct. PMH: for CAD, R TKA, cardiomyopathy, chronic systolic heart failure, macular degeneration. Repeat MBS for possible progression of liquids. Clinical Impression: Clinical Impression: Pt demonstrarted similar oropharyngeal swallow function from prior MBS. Continues with minimal oral dysphagia marked by trace lingual residue otherwise, manipulation, mastication and propulsion was functional. Pharyngeal phase marked by decreased laryngeal elevation, laryngeal closure resulting in silent aspiration with thin liquids. A left head turn reduced to penetration in all but one episode where he silently aspirated. A cue for hard cough was effective in ejecting aspirates/penetrates. Chin tuck and breath hold was not effective. In one instance penetration occured before complete closure and a breath hold strategy was not effective. Puree was penetrated x 1 cue  cue for throat clear needed. Reduced tongue base retraction led to mild vallecular and pyriform sinus residue that decreases with second swallow. It is recommended that he continue wtih regular texture and nectar liquids with SLP working with pt on thin liquids with left head turn and immediate volitional strong cough for consistency with strategy and upgrade to thin liquids at bedside when appropriate. Factors that may increase risk of adverse event in presence of aspiration Rubye Oaks & Clearance Coots 2021): No data recorded Recommendations/Plan: Swallowing Evaluation Recommendations Swallowing Evaluation Recommendations Recommendations: PO diet PO Diet Recommendation: Regular; Mildly thick liquids (Level 2, nectar thick) Liquid Administration via: Cup Medication Administration: Crushed with puree Supervision: Staff to assist with self-feeding; Full supervision/cueing for swallowing strategies Swallowing strategies  : Head turn left during swallowing Postural changes: Position pt fully upright for meals Oral care recommendations: Oral care BID (2x/day) Treatment Plan Treatment Plan Treatment recommendations: Therapy as outlined in treatment plan below Follow-up recommendations: Acute inpatient rehab (3 hours/day) Functional status assessment: Patient has had a recent decline in their functional status and demonstrates the ability to make significant improvements in function in a reasonable and predictable amount of time. Treatment frequency: Min 2x/week Treatment duration: 2 weeks Interventions: Aspiration precaution training; Patient/family education; Compensatory techniques; Trials of upgraded texture/liquids Recommendations Recommendations for follow up therapy are one component of a multi-disciplinary discharge planning process, led by the attending physician.  Recommendations may be updated based on patient status, additional functional criteria and insurance authorization. Assessment: Orofacial Exam: Orofacial Exam  Oral Cavity: Oral Hygiene: WFL Oral Cavity - Dentition: Adequate natural dentition Orofacial Anatomy: Other (comment) Oral Motor/Sensory Function: Suspected cranial nerve impairment CN V - Trigeminal: WFL CN VII - Facial: Left motor impairment CN IX - Glossopharyngeal, CN X - Vagus: Not tested Anatomy: Anatomy: WFL Boluses Administered: Boluses Administered Boluses Administered: Thin liquids (Level 0); Mildly thick liquids (Level 2, nectar thick); Puree; Solid  Oral Impairment Domain: Oral Impairment Domain  Lip Closure: No labial escape Tongue control during bolus hold: Cohesive bolus between tongue to palatal seal Bolus preparation/mastication: Timely and efficient chewing and mashing Bolus transport/lingual motion: Brisk tongue motion Oral residue: Trace residue lining oral structures Location of oral residue : Tongue Initiation of pharyngeal swallow : Pyriform sinuses  Pharyngeal Impairment Domain: Pharyngeal Impairment Domain Soft palate elevation: No bolus between soft palate (SP)/pharyngeal wall (PW) Laryngeal elevation: Partial superior movement of thyroid cartilage/partial approximation of arytenoids to epiglottic petiole Anterior hyoid excursion: Partial anterior movement Epiglottic movement: Partial inversion Laryngeal vestibule closure: Incomplete, narrow column air/contrast in laryngeal vestibule Pharyngeal stripping wave : Present - complete Pharyngeal contraction (A/P view only): N/A Pharyngoesophageal segment opening: Complete distension and complete duration, no obstruction of flow Tongue base retraction: Trace column of contrast or air between tongue base and PPW Pharyngeal residue: Collection of residue within or on pharyngeal structures Location of pharyngeal residue: Valleculae; Pyriform sinuses  Esophageal Impairment Domain: Esophageal Impairment Domain Esophageal clearance upright position: Complete clearance, esophageal coating Pill: Esophageal Impairment Domain Esophageal clearance upright  position: Complete clearance, esophageal coating Penetration/Aspiration Scale Score: Penetration/Aspiration Scale Score 2.  Material enters airway, remains ABOVE vocal cords then ejected out: Thin liquids (Level 0) 3.  Material enters airway, remains ABOVE vocal cords and not ejected out: Puree; Mildly thick liquids (Level 2, nectar thick) 4.  Material enters airway, CONTACTS cords then ejected out: Mildly thick liquids (Level 2, nectar thick) 8.  Material enters airway, passes BELOW cords without attempt by patient to eject out (silent aspiration) : Thin liquids (Level 0) Compensatory Strategies: Compensatory Strategies Compensatory strategies: Yes Multiple swallows: Effective Chin tuck: Ineffective Ineffective Chin Tuck: Thin liquid (Level 0); Mildly thick liquid (Level 2, nectar thick) Left head turn: Ineffective Effective Left Head Turn: Mildly thick liquid (Level 2, nectar thick) Ineffective Left Head Turn: Thin liquid (Level 0) Supraglottic swallow: Ineffective Ineffective Supraglottic Swallow: Thin liquid (Level 0)   General Information: Caregiver present: No  Diet Prior to this Study: Regular; Mildly thick liquids (Level 2, nectar thick)   Temperature : Normal   Respiratory Status: WFL   Supplemental O2: None (Room air)   History of Recent Intubation: No  Behavior/Cognition: Alert; Cooperative; Pleasant mood Self-Feeding Abilities: Able to self-feed Baseline vocal quality/speech: -- (dysarthric) Volitional Cough: Able to elicit Volitional Swallow: Able to elicit No data recorded Goal Planning: Prognosis for improved oropharyngeal function: Good No data recorded No data recorded Patient/Family Stated Goal: -- (improve speech and "volume") Consulted and agree with results and recommendations: Patient; Family member/caregiver Pain: Pain Assessment Pain Assessment: No/denies pain Pain Score: 5 Faces Pain Scale: 4 Pain Location: Headache Pain Descriptors / Indicators: Grimacing Pain Intervention(s): Monitored  during session End of Session: Start Time:SLP Start Time (ACUTE ONLY): 9147 Stop Time: SLP Stop Time (ACUTE ONLY): 0946 Time Calculation:SLP Time Calculation (min) (ACUTE ONLY): 17 min Charges: SLP Evaluations $ SLP Speech Visit: 1 Visit SLP Evaluations $BSS Swallow: 1 Procedure $MBS Swallow: 1 Procedure $ SLP EVAL LANGUAGE/SOUND PRODUCTION: 1 Procedure $Swallowing Treatment: 1 Procedure SLP visit diagnosis: SLP Visit Diagnosis: Dysphagia, oropharyngeal phase (R13.12) Past Medical History: Past Medical History: Diagnosis Date  Anxiety   Arthritis, rheumatoid   dx 1988  CAD (coronary artery disease), native coronary artery   Mild LAD disease with calcification noted at cath 12//27/16   Cardiomyopathy   Cardiomyopathy   Chronic systolic heart failure 05/01/2015  Depression   Deviated nasal septum 06/25/2011  Headache   High cholesterol   Hyperlipidemia   Impingement  syndrome of left shoulder 12/22/2017  Macular degeneration   Rheumatoid aortitis   Sleep apnea   does not use cpap - UNABLE TO TOLERATE MASK  Sleep apnea in adult   deviated septum repaired, most recent sleep study was negative  Status post total right knee replacement 06/01/2015 Past Surgical History: Past Surgical History: Procedure Laterality Date  ANKLE FUSION Right 05/05/2012  related to his arthritis  ANKLE FUSION Left 05/25/2013  related to his arthritis  ANKLE SURGERY Bilateral   APPLICATION OF CRANIAL NAVIGATION N/A 04/11/2022  Procedure: APPLICATION OF CRANIAL NAVIGATION;  Surgeon: Lisbeth Renshaw, MD;  Location: [facility] OR;  Service: Neurosurgery;  Laterality: N/A;  APPLICATION OF CRANIAL NAVIGATION N/A 08/08/2022  Procedure: APPLICATION OF CRANIAL NAVIGATION;  Surgeon: Lisbeth Renshaw, MD;  Location: [facility] OR;  Service: Neurosurgery;  Laterality: N/A;  CARDIAC CATHETERIZATION N/A 05/01/2015  Procedure: Left Heart Cath and Coronary Angiography;  Surgeon: Lyn Records, MD;  Location: [facility] INVASIVE CV LAB;  Service: Cardiovascular;  Laterality: N/A;   CRANIOTOMY N/A 04/11/2022  Procedure: STEREOTACTIC SUBOCCIPITAL CRANIOTOMY FOR RESECTION OF ARTERIO-VENOUS MALFORMATION;  Surgeon: Lisbeth Renshaw, MD;  Location: [facility] OR;  Service: Neurosurgery;  Laterality: N/A;  FRACTURE SURGERY    left femur fracture x 3  IR ANGIO EXTERNAL CAROTID SEL EXT CAROTID BILAT MOD SED  01/28/2022  IR ANGIO INTRA EXTRACRAN SEL INTERNAL CAROTID BILAT MOD SED  01/28/2022  IR ANGIO INTRA EXTRACRAN SEL INTERNAL CAROTID BILAT MOD SED  04/16/2022  IR ANGIO VERTEBRAL SEL VERTEBRAL BILAT MOD SED  01/28/2022  IR ANGIO VERTEBRAL SEL VERTEBRAL BILAT MOD SED  04/16/2022  KNEE ARTHROSCOPY    right x 4  LAPAROSCOPIC REVISION VENTRICULAR-PERITONEAL (V-P) SHUNT N/A 08/08/2022  Procedure: LAPAROSCOPIC VENTRICULAR-PERITONEAL (V-P) SHUNT;  Surgeon: Harriette Bouillon, MD;  Location: [facility] OR;  Service: General;  Laterality: N/A;  LUMBAR LAMINECTOMY/DECOMPRESSION MICRODISCECTOMY N/A 07/18/2022  Procedure: Repair of Pseudomeningiocele posterior;  Surgeon: Lisbeth Renshaw, MD;  Location: [facility] OR;  Service: Neurosurgery;  Laterality: N/A;  NASAL SEPTOPLASTY W/ TURBINOPLASTY  06/25/2011  Procedure: NASAL SEPTOPLASTY WITH TURBINATE REDUCTION;  Surgeon: Osborn Coho, MD;  Location[facility] OR;  Service: ENT;  Laterality: Bilateral;  PLACEMENT OF LUMBAR DRAIN N/A 07/18/2022  Procedure: PLACEMENT OF LUMBAR DRAIN;  Surgeon: Lisbeth Renshaw, MD;  Location: [facility] OR;  Service: Neurosurgery;  Laterality: N/A;  TONSILLECTOMY    TOTAL KNEE ARTHROPLASTY Right 06/01/2015  Procedure: RIGHT TOTAL KNEE ARTHROPLASTY;  Surgeon: Kathryne Hitch, MD;  Location: [facility] ORS;  Service: Orthopedics;  Laterality: Right;  Block+general  VENTRICULOPERITONEAL SHUNT Right 08/08/2022  Procedure: LAP ASSITED SHUNT INSERTION VENTRICULAR-PERITONEAL;  Surgeon: Lisbeth Renshaw, MD;  Location: [facility] OR;  Service: Neurosurgery;  Laterality: Right; Royce Macadamia 08/18/2022, 10:51 AM  CT HEAD WO CONTRAST ( )  Result Date: 08/13/2022 CLINICAL  DATA:  Headache, increasing frequency or severity. VP shunt. Previous resection of left cerebellar arteriovenous malformation. EXAM: CT HEAD WITHOUT CONTRAST TECHNIQUE: Contiguous axial images were obtained from the base of the skull through the vertex without intravenous contrast. RADIATION DOSE REDUCTION: This exam was performed according to the departmental dose-optimization program which includes automated exposure control, adjustment of the mA and/or kV according to patient size and/or use of iterative reconstruction technique. COMPARISON:  08/08/2022 and multiple previous FINDINGS: Brain: Since the prior examination, there has been placement of a VP shunt from a right frontal approach, entering the frontal horn of the right lateral ventricle with its tip in the frontal horn. Ventricular size remains stable. No evidence of hemorrhage along the shunt  track. Previous left suboccipital craniectomy for resection of left cerebellar arteriovenous malformation. Postsurgical defect in the posterior left cerebellum is unchanged. Pseudomeningocele superficially it appears unchanged. It could right superior cerebellar intraparenchymal cyst is unchanged since 1 week ago, measuring 2.8 x 3.5 x 2.5 cm. Continued mass-effect upon the fourth ventricle. Vascular: No abnormal vascular finding presently. Skull: As above Sinuses/Orbits: Clear except for a small amount of fluid in the sphenoid sinus. Other: Negative IMPRESSION: 1. Interval placement of a VP shunt from a right frontal approach, entering the frontal horn of the right lateral ventricle with its tip in the frontal horn. Ventricular size remains stable. No evidence of hemorrhage along the shunt track. 2. Previous left suboccipital craniectomy for resection of left cerebellar arteriovenous malformation. Pseudomeningocele superficially appears unchanged. 3. Right superior cerebellar intraparenchymal cyst is unchanged since 1 week ago, measuring 2.8 x 3.5 x 2.5 cm.  Continued mass-effect upon the fourth ventricle. Electronically Signed   By: Paulina Fusi M.D.   On: 08/13/2022 11:55   CT HEAD WO CONTRAST ( )  Addendum Date: 08/08/2022   ADDENDUM REPORT: 08/08/2022 15:45 ADDENDUM: Impression #2 called by telephone at the time of interpretation on 08/08/2022 at 3:45 pm to provider [facility] , who verbally acknowledged these results. Electronically Signed   By: Jackey Loge D.O.   On: 08/08/2022 15:45   Result Date: 08/08/2022 CLINICAL DATA:  Provided history: Hydrocephalus. Stereotactic protocol for intraoperative navigation. EXAM: CT HEAD WITHOUT CONTRAST TECHNIQUE: Contiguous axial images were obtained from the base of the skull through the vertex without intravenous contrast. RADIATION DOSE REDUCTION: This exam was performed according to the departmental dose-optimization program which includes automated exposure control, adjustment of the mA and/or kV according to patient size and/or use of iterative reconstruction technique. COMPARISON:  Prior head CT examinations 08/06/2022 and earlier. FINDINGS: Brain: Mild generalized cerebral atrophy. 3.4 x 2.7 x 3.0 cm cystic lesion in the right cerebellar hemisphere, not significantly changed in size from the head CT performed yesterday. As before, there is mass effect upon the fourth ventricle without evidence of acute hydrocephalus. There is a 3 mm focus of hyperdensity along the inferior aspect of this cystic lesion, which was likely present on yesterday's examination but partially obscured by streak/beam hardening artifact on this prior study (see series 3, image 8 on today's examination, as well as series 5, image 57). This finding was not definitively present on the prior head CT of 07/29/2022. This may reflect a tiny focus of acute/subacute hemorrhage or dystrophic calcification. Grossly unchanged fluid collection(s) within the left aspect of the posterior fossa and within the dorsal soft tissues underlying the  suboccipital craniectomy at the imaged levels. No demarcated cortical infarct. No midline shift. Vascular: No hyperdense vessel.  Atherosclerotic calcifications. Skull: Prior suboccipital craniectomy. Sinuses/Orbits: No mass or acute finding within the imaged orbits. Mucous retention cysts within the left frontal sinus measuring up to 10 mm. Minimal mucosal thickening and small mucous retention cysts within bilateral ethmoid air cells. Small fluid level within the right sphenoid sinus. IMPRESSION: 1. 3.4 x 2.7 x 3.0 cm cystic lesion within the right cerebellar hemisphere, unchanged in size from yesterday's head CT. As before, there is mass effect upon the fourth ventricle without evidence of acute obstructive hydrocephalus. 2. 3 mm hyperdense focus along the inferior aspect of the cystic lesion, as described and which may reflect a tiny focus of acute/subacute hemorrhage or dystrophic calcification. 3. Fluid collection(s) within the left aspect of the posterior fossa, and within the  dorsal soft tissues underlying the suboccipital craniectomy, grossly unchanged at the imaged levels. 4. Mild generalized cerebral atrophy. 5. Paranasal sinus disease, as described. Electronically Signed: By: Jackey Loge D.O. On: 08/08/2022 15:28   CT HEAD WO CONTRAST ( )  Result Date: 08/06/2022 CLINICAL DATA:  Mental status change, unknown cause. Pseudomeningocele. EXAM: CT HEAD WITHOUT CONTRAST TECHNIQUE: Contiguous axial images were obtained from the base of the skull through the vertex without intravenous contrast. RADIATION DOSE REDUCTION: This exam was performed according to the departmental dose-optimization program which includes automated exposure control, adjustment of the mA and/or kV according to patient size and/or use of iterative reconstruction technique. COMPARISON:  Head CT 07/29/2022. FINDINGS: Brain: Increased size of the cystic lesion along the roof of the fourth ventricle in the right cerebellar hemisphere, now  measuring up to 3.6 x 2.6 cm (image 10 series 4), previously 2.1 x 1.4 cm. Increased mass effect on the inferior fourth ventricle. No acute hydrocephalus. No acute intracranial hemorrhage. Supratentorial cortical gray-white differentiation is preserved. Vascular: No hyperdense vessel or unexpected calcification. Skull: Unchanged postoperative appearance from prior suboccipital craniectomy. Sinuses/Orbits: Unremarkable. Other: Unchanged fluid collection in the dorsal soft tissues underlying the suboccipital craniectomy. IMPRESSION: 1. Increased size of the cystic lesion along the roof of the fourth ventricle in the right cerebellar hemisphere, now measuring up to 3.6 x 2.6 cm, previously 2.1 x 1.4 cm. Increased mass effect on the inferior fourth ventricle. No acute hydrocephalus. 2. Unchanged fluid collection in the dorsal soft tissues underlying the suboccipital craniectomy. Electronically Signed   By: Orvan Falconer M.D.   On: 08/06/2022 12:16   DG Swallowing Func-Speech Pathology  Result Date: 07/30/2022 Table formatting from the original result was not included. Modified Barium Swallow Study Patient Details Name: Christopher Yu MRN: [id number] Date of Birth: 1960/06/09 Today's Date: 07/30/2022 HPI/PMH: HPI: 62 y.o. male presents to [facility] hospital on 07/28/2022 with slurred speech, worsening gait and HA. Pt recently discharged 07/25/2022 after operative repair of pseudomeningiocele. MRI brain 3/25 demonstrates acute R cerebellar infarct. PMH: for CAD, R TKA, cardiomyopathy, chronic systolic heart failure, macular degeneration. Clinical Impression: Clinical Impression: Pt demonstrated minimal oral dysphagia with trace lingual residue present intermittently and mild pharyngeal dysphagia. Pt exhibited decreased laryngeal elevation with thin barium entering airway prior to full laryngeal closure resulting in penetration remaining on the anterior portion of vestibule. A chin tuck posture was not effective and  he continued to penetrate with spontaeous cough that expelled penetrates. A head turn to left with thin resulted in sensed aspiration with weak cough. Nectar thick was also penetrated during and after the swallow and chin tuck was ineffective however a head turn to the left was 90% effective to prevent penetration over multiple trials. Reduced tonuge base retraction led to minimal vallecular residue consistently. Pt was able to masticate solid texture with flash penetration. Given deconditioning recommend he start a more conservative diet texture of Dys 2 (minced), nectar thick with head turn to the left with liquids, intermittent cough/throat clear, no straws, small sips and initiate meds crushed in puree (can advance to whole in puree as able). Factors that may increase risk of adverse event in presence of aspiration Rubye Oaks & Clearance Coots 2021): No data recorded Recommendations/Plan: Swallowing Evaluation Recommendations Swallowing Evaluation Recommendations Recommendations: PO diet PO Diet Recommendation: Dysphagia 2 (Finely chopped); Mildly thick liquids (Level 2, nectar thick) Liquid Administration via: Cup; No straw Medication Administration: Crushed with puree Supervision: Staff to assist with self-feeding; Full supervision/cueing for swallowing strategies Swallowing  strategies  : Head turn left during swallowing (with liquids) Postural changes: Position pt fully upright for meals Oral care recommendations: Oral care BID (2x/day) Treatment Plan Treatment Plan Treatment recommendations: Therapy as outlined in treatment plan below Follow-up recommendations: Acute inpatient rehab (3 hours/day) Functional status assessment: Patient has had a recent decline in their functional status and demonstrates the ability to make significant improvements in function in a reasonable and predictable amount of time. Treatment frequency: Min 2x/week Treatment duration: 2 weeks Interventions: Aspiration precaution training;  Patient/family education; Compensatory techniques; Trials of upgraded texture/liquids Recommendations Recommendations for follow up therapy are one component of a multi-disciplinary discharge planning process, led by the attending physician.  Recommendations may be updated based on patient status, additional functional criteria and insurance authorization. Assessment: Orofacial Exam: Orofacial Exam Oral Cavity: Oral Hygiene: WFL Oral Cavity - Dentition: Adequate natural dentition Orofacial Anatomy: Other (comment) Oral Motor/Sensory Function: Suspected cranial nerve impairment CN V - Trigeminal: WFL CN VII - Facial: Left motor impairment CN IX - Glossopharyngeal, CN X - Vagus: Not tested Anatomy: Anatomy: WFL Boluses Administered: Boluses Administered Boluses Administered: Thin liquids (Level 0); Mildly thick liquids (Level 2, nectar thick); Moderately thick liquids (Level 3, honey thick); Puree; Solid  Oral Impairment Domain: Oral Impairment Domain Lip Closure: No labial escape Tongue control during bolus hold: Cohesive bolus between tongue to palatal seal Bolus preparation/mastication: Timely and efficient chewing and mashing Bolus transport/lingual motion: Brisk tongue motion Oral residue: Trace residue lining oral structures Location of oral residue : Tongue Initiation of pharyngeal swallow : Pyriform sinuses  Pharyngeal Impairment Domain: Pharyngeal Impairment Domain Soft palate elevation: No bolus between soft palate (SP)/pharyngeal wall (PW) Laryngeal elevation: Partial superior movement of thyroid cartilage/partial approximation of arytenoids to epiglottic petiole Anterior hyoid excursion: Partial anterior movement Epiglottic movement: Complete inversion Laryngeal vestibule closure: Incomplete, narrow column air/contrast in laryngeal vestibule Pharyngeal stripping wave : Present - complete Pharyngeal contraction (A/P view only): N/A Pharyngoesophageal segment opening: Complete distension and complete  duration, no obstruction of flow Tongue base retraction: Trace column of contrast or air between tongue base and PPW Pharyngeal residue: Collection of residue within or on pharyngeal structures Location of pharyngeal residue: Valleculae  Esophageal Impairment Domain: Esophageal Impairment Domain Esophageal clearance upright position: Complete clearance, esophageal coating Pill: Esophageal Impairment Domain Esophageal clearance upright position: Complete clearance, esophageal coating Penetration/Aspiration Scale Score: Penetration/Aspiration Scale Score 2.  Material enters airway, remains ABOVE vocal cords then ejected out: Thin liquids (Level 0) 3.  Material enters airway, remains ABOVE vocal cords and not ejected out: Thin liquids (Level 0) 4.  Material enters airway, CONTACTS cords then ejected out: Mildly thick liquids (Level 2, nectar thick) Compensatory Strategies: Compensatory Strategies Compensatory strategies: Yes Chin tuck: Ineffective Ineffective Chin Tuck: Thin liquid (Level 0); Mildly thick liquid (Level 2, nectar thick) Left head turn: Effective (ineffective with thin, effective with nectar) Effective Left Head Turn: Mildly thick liquid (Level 2, nectar thick) Ineffective Left Head Turn: Thin liquid (Level 0)   General Information: Caregiver present: No  Diet Prior to this Study: NPO   Temperature : Normal   Respiratory Status: WFL   Supplemental O2: None (Room air)   History of Recent Intubation: No  Behavior/Cognition: Alert; Cooperative; Pleasant mood Self-Feeding Abilities: Able to self-feed Baseline vocal quality/speech: Hypophonia/low volume Volitional Cough: Able to elicit Volitional Swallow: Able to elicit No data recorded Goal Planning: Prognosis for improved oropharyngeal function: Good No data recorded No data recorded No data recorded Consulted and agree with  results and recommendations: Patient; Family member/caregiver Pain: Pain Assessment Pain Assessment: No/denies pain Pain Score: 5  Faces Pain Scale: 4 Pain Location: Headache Pain Descriptors / Indicators: Grimacing Pain Intervention(s): Monitored during session End of Session: Start Time:SLP Start Time (ACUTE ONLY): 1038 Stop Time: SLP Stop Time (ACUTE ONLY): 1051 Time Calculation:SLP Time Calculation (min) (ACUTE ONLY): 13 min Charges: SLP Evaluations $ SLP Speech Visit: 1 Visit SLP Evaluations $BSS Swallow: 1 Procedure $MBS Swallow: 1 Procedure SLP visit diagnosis: SLP Visit Diagnosis: Dysphagia, oropharyngeal phase (R13.12) Past Medical History: Past Medical History: Diagnosis Date  Anxiety   Arthritis, rheumatoid (HCC)   dx 1988  CAD (coronary artery disease), native coronary artery   Mild LAD disease with calcification noted at cath 12//27/16   Cardiomyopathy (HCC)   Cardiomyopathy (HCC)   Chronic systolic heart failure (HCC) 05/01/2015  Depression   Deviated nasal septum 06/25/2011  Headache   High cholesterol   Hyperlipidemia   Impingement syndrome of left shoulder 12/22/2017  Macular degeneration   Rheumatoid aortitis   Sleep apnea   does not use cpap - UNABLE TO TOLERATE MASK  Sleep apnea in adult   deviated septum repaired, most recent sleep study was negative  Status post total right knee replacement 06/01/2015 Past Surgical History: Past Surgical History: Procedure Laterality Date  ANKLE FUSION Right 05/05/2012  related to his arthritis  ANKLE FUSION Left 05/25/2013  related to his arthritis  ANKLE SURGERY Bilateral   APPLICATION OF CRANIAL NAVIGATION N/A 04/11/2022  Procedure: APPLICATION OF CRANIAL NAVIGATION;  Surgeon: Lisbeth Renshaw, MD;  Location: [facility] OR;  Service: Neurosurgery;  Laterality: N/A;  CARDIAC CATHETERIZATION N/A 05/01/2015  Procedure: Left Heart Cath and Coronary Angiography;  Surgeon: Lyn Records, MD;  Location: [facility] INVASIVE CV LAB;  Service: Cardiovascular;  Laterality: N/A;  CRANIOTOMY N/A 04/11/2022  Procedure: STEREOTACTIC SUBOCCIPITAL CRANIOTOMY FOR RESECTION OF ARTERIO-VENOUS MALFORMATION;  Surgeon:  Lisbeth Renshaw, MD;  Location: [facility] OR;  Service: Neurosurgery;  Laterality: N/A;  FRACTURE SURGERY    left femur fracture x 3  IR ANGIO EXTERNAL CAROTID SEL EXT CAROTID BILAT MOD SED  01/28/2022  IR ANGIO INTRA EXTRACRAN SEL INTERNAL CAROTID BILAT MOD SED  01/28/2022  IR ANGIO INTRA EXTRACRAN SEL INTERNAL CAROTID BILAT MOD SED  04/16/2022  IR ANGIO VERTEBRAL SEL VERTEBRAL BILAT MOD SED  01/28/2022  IR ANGIO VERTEBRAL SEL VERTEBRAL BILAT MOD SED  04/16/2022  KNEE ARTHROSCOPY    right x 4  LUMBAR LAMINECTOMY/DECOMPRESSION MICRODISCECTOMY N/A 07/18/2022  Procedure: Repair of Pseudomeningiocele posterior;  Surgeon: Lisbeth Renshaw, MD;  Location: [facility] OR;  Service: Neurosurgery;  Laterality: N/A;  NASAL SEPTOPLASTY W/ TURBINOPLASTY  06/25/2011  Procedure: NASAL SEPTOPLASTY WITH TURBINATE REDUCTION;  Surgeon: Osborn Coho, MD;  Location: [facility] OR;  Service: ENT;  Laterality: Bilateral;  PLACEMENT OF LUMBAR DRAIN N/A 07/18/2022  Procedure: PLACEMENT OF LUMBAR DRAIN;  Surgeon: Lisbeth Renshaw, MD;  Location: [facility] OR;  Service: Neurosurgery;  Laterality: N/A;  TONSILLECTOMY    TOTAL KNEE ARTHROPLASTY Right 06/01/2015  Procedure: RIGHT TOTAL KNEE ARTHROPLASTY;  Surgeon: Kathryne Hitch, MD;  Location: [facility] ORS;  Service: Orthopedics;  Laterality: Right;  Block+general Royce Macadamia 07/30/2022, 12:27 PM  ECHOCARDIOGRAM COMPLETE  Result Date: 07/29/2022    ECHOCARDIOGRAM REPORT   Patient Name:   Christopher Yu Date of Exam: 07/29/2022 Medical Rec #:  [id number]                   Height:       73.0 in  Accession #:    [id number]                  Weight:       199.0 lb Date of Birth:  [birth year]                   BSA:          2.147 m Patient Age:    61 years                    BP:           124/103 mmHg Patient Gender: M                           HR:           93 bpm. Exam Location:  Inpatient Procedure: 2D Echo, Cardiac Doppler and Color Doppler Indications:    Stroke I63.9  History:        Patient  has prior history of Echocardiogram examinations, most                 recent 06/22/2017. Cardiomyopathy and CHF, CAD, Stroke; Risk                 Factors:Sleep Apnea and Dyslipidemia.  Sonographer:    Lucendia Herrlich Referring Phys: [id number] TIMOTHY S OPYD  Sonographer Comments: No subcostal window. IMPRESSIONS  1. Left ventricular ejection fraction, by estimation, is 40 to 45%. The left ventricle has mildly decreased function. The left ventricle demonstrates global hypokinesis. Left ventricular diastolic parameters are consistent with Grade I diastolic dysfunction (impaired relaxation).  2. Right ventricular systolic function is normal. The right ventricular size is normal.  3. The mitral valve is normal in structure. No evidence of mitral valve regurgitation.  4. Suspect bicuspid aortic valve. There is mild low flow-low gradient aortic stenosis (gradients are underestimated due to low stroke volume). The aortic valve is bicuspid. There is mild calcification of the aortic valve. There is moderate thickening of  the aortic valve. Aortic valve regurgitation is not visualized. Mild aortic valve stenosis. Aortic valve mean gradient measures 7.0 mmHg. Aortic valve Vmax measures 1.78 m/s.  5. Aortic dilatation noted. There is moderate dilatation of the aortic root, measuring 45 mm. Comparison(s): Prior images unable to be directly viewed, comparison made by report only. The aortic valve is identified as bicuspid, otherwise no change. FINDINGS  Left Ventricle: Left ventricular ejection fraction, by estimation, is 40 to 45%. The left ventricle has mildly decreased function. The left ventricle demonstrates global hypokinesis. The left ventricular internal cavity size was normal in size. There is  borderline concentric left ventricular hypertrophy. Left ventricular diastolic parameters are consistent with Grade I diastolic dysfunction (impaired relaxation). Normal left ventricular filling pressure. Right Ventricle: The  right ventricular size is normal. No increase in right ventricular wall thickness. Right ventricular systolic function is normal. Left Atrium: Left atrial size was normal in size. Right Atrium: Right atrial size was normal in size. Pericardium: There is no evidence of pericardial effusion. Mitral Valve: The mitral valve is normal in structure. No evidence of mitral valve regurgitation. Tricuspid Valve: The tricuspid valve is normal in structure. Tricuspid valve regurgitation is not demonstrated. Aortic Valve: Suspect bicuspid aortic valve. There is mild low flow-low gradient aortic stenosis (gradients are underestimated due to low stroke volume). The aortic valve is bicuspid. There is mild calcification of the aortic valve. There is moderate  thickening of the aortic valve. Aortic valve regurgitation is not visualized. Mild aortic stenosis is present. Aortic valve mean gradient measures 7.0 mmHg. Aortic valve peak gradient measures 12.7 mmHg. Aortic valve area, by VTI measures 1.97 cm. Pulmonic Valve: The pulmonic valve was grossly normal. Pulmonic valve regurgitation is not visualized. Aorta: Aortic dilatation noted. There is moderate dilatation of the aortic root, measuring 45 mm. IAS/Shunts: No atrial level shunt detected by color flow Doppler.  LEFT VENTRICLE PLAX 2D LVIDd:         4.60 cm LVIDs:         3.20 cm LV PW:         1.10 cm LV IVS:        1.20 cm LVOT diam:     2.60 cm LV SV:         60 LV SV Index:   28 LVOT Area:     5.31 cm  RIGHT VENTRICLE RV S prime:     12.90 cm/s LEFT ATRIUM             Index        RIGHT ATRIUM           Index LA diam:        3.60 cm 1.68 cm/m   RA Area:     15.70 cm LA Vol (A2C):   39.7 ml 18.49 ml/m  RA Volume:   31.45 ml  14.65 ml/m LA Vol (A4C):   31.0 ml 14.44 ml/m LA Biplane Vol: 36.5 ml 17.00 ml/m  AORTIC VALVE AV Area (Vmax):    2.01 cm AV Area (Vmean):   1.93 cm AV Area (VTI):     1.97 cm AV Vmax:           178.50 cm/s AV Vmean:          121.500 cm/s AV VTI:             0.302 m AV Peak Grad:      12.7 mmHg AV Mean Grad:      7.0 mmHg LVOT Vmax:         67.42 cm/s LVOT Vmean:        44.100 cm/s LVOT VTI:          0.112 m LVOT/AV VTI ratio: 0.37  AORTA Ao Root diam: 4.55 cm Ao Asc diam:  4.20 cm MITRAL VALVE MV Area (PHT): 3.77 cm    SHUNTS MV Decel Time: 201 msec    Systemic VTI:  0.11 m MV E velocity: 38.00 cm/s  Systemic Diam: 2.60 cm MV A velocity: 75.10 cm/s MV E/A ratio:  0.51 Mihai Croitoru MD Electronically signed by Thurmon Fair MD Signature Date/Time: 07/29/2022/4:13:22 PM    Final    CT ANGIO HEAD NECK W WO CM  Result Date: 07/29/2022 CLINICAL DATA:  Stroke, determine embolic source EXAM: CT ANGIOGRAPHY HEAD AND NECK TECHNIQUE: Multidetector CT imaging of the head and neck was performed using the standard protocol during bolus administration of intravenous contrast. Multiplanar CT image reconstructions and MIPs were obtained to evaluate the vascular anatomy. Carotid stenosis measurements (when applicable) are obtained utilizing NASCET criteria, using the distal internal carotid diameter as the denominator. RADIATION DOSE REDUCTION: This exam was performed according to the departmental dose-optimization program which includes automated exposure control, adjustment of the mA and/or kV according to patient size and/or use of iterative reconstruction technique. CONTRAST:  75mL OMNIPAQUE IOHEXOL 350 MG/ML SOLN COMPARISON:  Brain MRI from yesterday FINDINGS: CT HEAD  FINDINGS Brain: Pseudomeningocele with recent repair and posterior fossa gas along the upper cerebellum. Infarct by MRIs difficult to separate from chronic low-density in the right more than left cerebral hemisphere. Unchanged collection with mass effect asymmetrically to the left cerebral hemisphere. No hydrocephalus or acute hemorrhage. Vascular: See below Skull: Remote suboccipital craniectomy. Sinuses/Orbits: Negative Review of the MIP images confirms the above findings CTA NECK FINDINGS Aortic  arch: Atheromatous plaque with 3 vessel branching. Right carotid system: Mild atheromatous plaque at the bifurcation. No ulceration or significant stenosis Left carotid system: Mild atheromatous plaque at the bifurcation. No ulceration or significant stenosis. Vertebral arteries: No proximal subclavian stenosis. Mild atheromatous plaque at the right vertebral origin. No beading or ulceration. Extrinsic narrowing of the left vertebral artery at C5-6 due to facet spurring from behind, narrowing measuring 60% on sagittal reformats. Skeleton: Ordinary cervical spine degeneration. Other neck: No acute finding. Known dissecting fluid collection spanning from the suboccipital craniectomy inferiorly into the neck. Upper chest: Clear apical lungs. Review of the MIP images confirms the above findings CTA HEAD FINDINGS Anterior circulation: Mild calcified plaque along the carotid siphons. No stenosis, branch occlusion, beading, or aneurysm Posterior circulation: The vertebral and basilar arteries are smoothly contoured and widely patent. Symmetrically enhancing superior cerebellar arteries without visible occlusion or irregularity. Symmetric and smoothly contoured posterior cerebral arteries. Venous sinuses: Unremarkable for arterial timing. Anatomic variants: None significant Review of the MIP images confirms the above findings IMPRESSION: 1. No vascular lesion seen underlying the cerebellar infarct. 2. Chronic 60% narrowing of the left vertebral artery due to extrinsic compression by C5-6 left facet osteophyte. 3. Limited atheromatous change for age. Electronically Signed   By: Tiburcio Pea M.D.   On: 07/29/2022 05:06   MR Brain Wo Contrast (neuro protocol)  Result Date: 07/28/2022 CLINICAL DATA:  Neuro deficit, acute, stroke suspected. EXAM: MRI HEAD WITHOUT CONTRAST TECHNIQUE: Multiplanar, multiecho pulse sequences of the brain and surrounding structures were obtained without intravenous contrast. COMPARISON:  Head  CT 07/28/2022.  MRI brain 07/05/2022. FINDINGS: Brain: New small focus of acute infarction in the right superior cerebellar hemisphere with surrounding area of increased T2 signal and cystic change, which is new since the prior MRI dated 07/05/2022 and suspicious for now chronic infarct. Postoperative changes of prior left posterior fossa resection. Redemonstrated foci of gas within the resection cavity, fourth ventricle and extra-axial spaces, likely secondary to recent pseudomeningocele repair and/or lumbar drain placement/removal. No acute intracranial hemorrhage.  No hydrocephalus. Vascular: Normal flow voids. Skull and upper cervical spine: Suboccipital craniectomy. Sinuses/Orbits: Trace fluid in the right sphenoid sinus. Other: Decreased size of the fluid collection in the dorsal soft tissues overlying the suboccipital craniectomy. IMPRESSION: 1. New small focus of acute infarction in the right superior cerebellar hemisphere with surrounding area of increased T2 signal and cystic change, which is new since the prior MRI dated 07/05/2022 and suspicious for now chronic infarct. 2. Postoperative changes of prior left posterior fossa resection. Redemonstrated foci of gas within the resection cavity, fourth ventricle and extra-axial spaces, likely secondary to recent pseudomeningocele repair and/or lumbar drain placement/removal. 3. Decreased size of the fluid collection in the dorsal soft tissues overlying the suboccipital craniectomy. Electronically Signed   By: Orvan Falconer M.D.   On: 07/28/2022 20:06   CT Head Wo Contrast  Result Date: 07/28/2022 CLINICAL DATA:  Headache, neuro deficit. EXAM: CT HEAD WITHOUT CONTRAST TECHNIQUE: Contiguous axial images were obtained from the base of the skull through the vertex without intravenous contrast.  RADIATION DOSE REDUCTION: This exam was performed according to the departmental dose-optimization program which includes automated exposure control, adjustment of the  mA and/or kV according to patient size and/or use of iterative reconstruction technique. COMPARISON:  Head CT 04/29/2022.  MRI brain 07/05/2022. 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 New pneumocephalus in the resection cavity, likely secondary to recent pseudomeningocele repair and lumbar drain placement/removal. No acute intracranial hemorrhage, mass effect or midline shift. No hydrocephalus. Vascular: No hyperdense vessel or unexpected calcification. Skull: Suboccipital craniectomy. Sinuses/Orbits: Trace fluid in the sphenoid sinus. Orbits are unremarkable. Other: Partially imaged fluid collection in the dorsal soft tissues overlying the suboccipital craniectomy, likely residual or recurrent pseudomeningocele. IMPRESSION: 1. 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 New pneumocephalus in the resection cavity, likely secondary to recent pseudomeningocele repair and lumbar drain placement/removal. No acute intracranial hemorrhage. 2. Partially imaged fluid collection in the dorsal soft tissues overlying the suboccipital craniectomy, likely residual or recurrent pseudomeningocele. Electronically Signed   By: Orvan Falconer M.D.   On: 07/28/2022 17:15    Labs:  Basic Metabolic Panel: Recent Labs  Lab 08/25/22 0601  NA 141  K 3.7  CL 106  CO2 28  GLUCOSE 104*  BUN 15  CREATININE 0.83  CALCIUM 8.7*    CBC: Recent Labs  Lab 08/25/22 0601  WBC 6.9  HGB 9.3*  HCT 29.3*  MCV 74.6*  PLT 143*    CBG: Recent Labs  Lab 08/21/22 0714  GLUCAP 96    Family history.  Positive for lupus.  Denies any colon cancer esophageal cancer or rectal cancer  Brief HPI:   Christopher Yu is a 62 y.o. 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 Conchita Paris and discharged home 07/25/2022.  Per chart review lives with spouse and family.  Spouse works multiple shifts.  Two-level home bed and bath main level.  Patient ambulating with a rolling walker and assistance from spouse since recent discharge 3/22.  Presented 07/28/2022 with progressive slurred speech gait abnormality and headache.  CT/MRI showed new small focus of acute infarction in the right superior cerebellar hemisphere with surrounding area of increased T2 signal and cystic change new since prior comparison 07/05/2022.  CT angiogram head and neck no vascular lesions seen underlying the cerebellar infarction.  Echocardiogram with ejection fraction of 40 to 45% left ventricle demonstrating global hypokinesis with grade 1 diastolic dysfunction.  Neurology follow-up as well as neurosurgery Dr. Hoyt Koch currently maintained on low-dose aspirin.  No current plan for neurosurgical intervention at that time.  Lovenox added for DVT prophylaxis.  Neurology as well as neurosurgery felt that small DWI hyperintensity identified on imaging not felt to be typical of stroke more likely to be postoperative change.  He was started on steroids with dexamethasone taper.  His home rheumatologic medications currently on hold plan to reconsider restarting after dexamethasone taper completed.  He was on a regular diet with nectar thick liquids.  Therapy evaluations completed due to patient's decreased functional mobility and gait abnormality was admitted for a comprehensive rehab program.   Hospital Course: Christopher Yu was admitted to rehab 07/31/2022 for inpatient therapies to consist of PT, ST  and OT at least three hours five days a week. Past admission  physiatrist, therapy team and rehab RN have worked together to provide customized collaborative inpatient rehab.  Pertaining to patient's cerebellar CVA recent pseudomeningocele repair and history of AVM resection December 2023.  After further review and discussion with neurosurgery as well as neurology it was felt patient had sustained small cerebellar stroke and not just postoperative changes.  He did continue on low-dose aspirin therapy.  Lovenox for DVT prophylaxis.  Decadron is indicated with taper.  On 08/06/2018 for worsening hydrocephalus VP shunt placed 4/5 per neurosurgery adjusted 4/8 with improvement in ataxia.  Latest cranial CT scan remained stable.  Pain management with the use of oxycodone as needed as well as Voltaren gel.  Valium was on as an as-needed basis.  Lyrica 200 mg twice daily with good results after initially being placed on Neurontin.  Attempted Topamax for headache with little relief and discontinued.  Mood stabilization maintained on Zoloft and as noted with melatonin for sleep.  Neuropsychology continue to follow during rehabilitation stay.  Dysphagia with follow-up speech therapy regular consistency with nectar thick liquids monitoring of hydration.  Crestor ongoing for hyperlipidemia.  History of rheumatoid arthritis on methotrexate as well as sulfasalazine and rituximab which had initially been on hold until dexamethasone taper completed and then resume.  History of CAD no chest pain or shortness of breath low-dose aspirin as indicated.  Chronic systolic congestive heart failure exhibiting no signs of fluid overload and blood pressure controlled.  OSA intolerant to CPAP counseling provided.  Macular degeneration with PreserVision and eyedrops as indicated.   Blood pressures were monitored on TID basis and controll   Rehab course: During patient's stay in rehab weekly team conferences were held to monitor patient's progress, set goals and discuss barriers to discharge. At  admission, patient required min mod assist rolling walker minimal assist sit to stand  Physical exam.  Blood pressure 104/65 pulse 75 temperature 98.4 respirations 18 oxygen saturation is 95% room air Constitutional.  No acute distress HEENT Eyes.  Pupils round and reactive to light Neck.  Supple nontender no JVD without thyromegaly Cardiac regular rate and rhythm without any extra sounds or murmur heard Abdomen.  Soft nontender positive bowel sounds without rebound Respiratory effort normal no respiratory distress without wheeze Extremities no clubbing cyanosis or edema Neurologic.  Alert makes eye contact with examiner follows commands.  Noted dysarthria but intelligible.  Able to name 3 objects. Strength 5 out of 5 bilateral upper extremities Strength 4+ out of 5 proximal lower extremities, 5 out of 5 distal lower extremities Sensation intact  He/She  has had improvement in activity tolerance, balance, postural control as well as ability to compensate for deficits. He/She has had improvement in functional use RUE/LUE  and RLE/LLE as well as improvement in awareness.  Sessions focused on functional transfers and ambulation to promote safety and mobility.  Patient independent transfers edge of bed followed by set up assistance to don shoes.  Ambulates to the toilet edge of bed rolling walker contact-guard.  Patient use same assistance level for handwashing.  Completed block to practice ambulation endurance with emphasis on safety contact-guard.  Moved to edge of bed with modified independent during ADLs.  Sit to stand with weighted rolling walker close supervision ambulates to the dresser for item retrieval's.  Significantly improved greatly controlled and balance with less ataxia.  Speech therapy continue to follow for dysphagia.  SLP facilitated sessions by min mod assist verbal  cues for use of diaphragmatic breathing.  Moderate verbal cues needed for use of over articulation at the phrase level  during an informal conversation to achieve 80% intelligibility.  Full family teaching completed plan discharge to home       Disposition: Discharge to home   Diet: Regular consistency nectar liquids  Special Instructions: No driving smoking or alcohol  Medications at discharge 1.  Tylenol as needed 2.  Aspirin 81 mg p.o. daily 3.  Valium 5 mg every 8 hours as needed muscle spasms 4.  Voltaren gel 2 g 4 times daily to affected area 5.  Vitamin D 2000 units p.o. daily 6.  Melatonin 5 mg p.o. nightly 7.  Pro sight 1 tab p.o. daily 8.  Oxycodone 5 mg twice daily as needed pain 9.  Lyrica 200 mg p.o. twice daily 10.  Crestor 20 mg p.o. daily 11.  Zoloft 75 mg p.o. daily 12.  Rituxan every 6 months as indicated 13 .  Methotrexate 20 mg by mouth weekly 14.  Sulfasalazine 1000 mg p.o. twice daily 15.  Aldactone 12.5 mg daily 16.  Calcium 1 tablet daily   Discharge Instructions     Ambulatory referral to Occupational Therapy   Complete by: As directed    Eval and treat   Ambulatory referral to Physical Medicine Rehab   Complete by: As directed    Moderate complexity follow-up 1 to 2 weeks pseudomeningocele   Ambulatory referral to Physical Therapy   Complete by: As directed    Eval and treat   Ambulatory referral to Speech Therapy   Complete by: As directed    Eval and treat        Follow-up Information     Elijah Birk C, DO Follow up.   Specialty: Physical Medicine and Rehabilitation Why: office to call for appointment Contact information: [address] [place] [address] [phone]         Lisbeth Renshaw, MD Follow up.   Specialty: Neurosurgery Why: call for appointment Contact information: [address] [place] [address] [phone]                 Signed: Charlton Amor 08/26/2022, 5:06 AM

## 2022-08-22 MED ORDER — METHOTREXATE SODIUM 2.5 MG PO TABS
20.0000 mg | ORAL_TABLET | ORAL | Status: DC
  Administered 2022-08-23: 20 mg via ORAL
  Filled 2022-08-22: qty 8

## 2022-08-22 MED ORDER — SULFASALAZINE 500 MG PO TBEC
1000.0000 mg | DELAYED_RELEASE_TABLET | Freq: Two times a day (BID) | ORAL | Status: DC
  Administered 2022-08-23 – 2022-08-26 (×7): 1000 mg via ORAL
  Filled 2022-08-22 (×7): qty 2

## 2022-08-22 NOTE — Progress Notes (Signed)
PROGRESS NOTE   Subjective/Complaints:   No acute complaints.  No events overnight.  Was not interrupted last night for pain medication, states he slept well.  Wife at bedside today, asking if posterior swelling from pseudomeningocele should be coming down more quickly; discussed patient's improved clinical course and ongoing improvement in this area, may take some time to fully resolve.  Patient approved for [place] protocol, refuses use of Provell cup, SLP working on swallowing strategies and training with family.  Discussed resumption of RA medications, per rheumatology.  Agreeable to deferring rituximab to outpatient rheumatology follow-up.  Will resume oral medications starting in AM.  Patient does feel like headache is slightly better since staple removal yesterday.  Continues to use twice daily as needed's, discussed continuing his current regimen through the weekend and reassessing on Monday.  ROS:  + Adjustment difficulty with depressed mood - improving +dysarthria c/b wet vocal quality-improved +HAs/postop head pain-ongoing, improving +insomnia-resolved with medication retiming Denies fevers, chills, N/V, constipation, diarrhea, SOB, cough, chest pain, new weakness or paraesthesias.    Objective:   No results found. No results for input(s): "WBC", "HGB", "HCT", "PLT" in the last 72 hours.   No results for input(s): "NA", "K", "CL", "CO2", "GLUCOSE", "BUN", "CREATININE", "CALCIUM" in the last 72 hours.   Intake/Output Summary (Last 24 hours) at 08/22/2022 1312 Last data filed at 08/22/2022 0800 Gross per 24 hour  Intake 476 ml  Output 475 ml  Net 1 ml         Physical Exam: Vital Signs Blood pressure 97/66, pulse 75, temperature 97.6 F (36.4 C), resp. rate 18, height 6\' 1"  (1.854 m), weight 89.7 kg, SpO2 91 %.    General: alert, sitting up in bed, no acute distress  HEENT:  R scalp  - shunt in place,  staples s/p removal.  PERRLA.   4/17: Right beating nystagmus, most apparent on leftward gaze. Some difficulty with convergence - stable  Neck: Supple without JVD Heart: Reg rate and rhythm. No murmurs rubs or gallops Chest: CTA bilaterally without wheezes, rales, or rhonchi; no distress.  Abdomen: Soft, nonTTP, non-distended, bowel sounds positive. Multiple laparoscopic incisions with glue, c/d/i Extremities: No clubbing, cyanosis, or edema.   Psych: Pt's affect is appropriate . Pt is cooperative.   Skin: Clean and intact without signs of breakdown,  healed incision/scar to posterior neck, with fluctuant fluid-stable  + Staples removed, shunt site c/d/I.  Minimal bruising along shunt trajectory through the side of his neck. + Abdominal trochar sites well approximated with dermabond, c/d/I.  No tenderness to palpation Neuro: Alert and oriented x 4. + Right facial droop -ongoing  + Speech apraxia and dysarthria -slowly improving  strength antigravity and against resistance in bilateral upper extremities and lower extremities Sensation intact light touch in all 4 extremities + Tender to palpation around the fluctuant fluid side and throughout the neck, bilateral temples, and forehead. -improved tolerance for palpation + FTN  altered  improving daily-mild bilaterally, much improved Musculoskeletal: Normal bulk and tone, no joint swelling noted.  Moving all 4 limbs antigravity and against resistance.    Assessment/Plan: 1. Functional deficits which require 3+ hours per day of interdisciplinary therapy  in a comprehensive inpatient rehab setting. Physiatrist is providing close team supervision and 24 hour management of active medical problems listed below. Physiatrist and rehab team continue to assess barriers to discharge/monitor patient progress toward functional and medical goals  Care Tool:  Bathing    Body parts bathed by patient: Right arm, Left arm, Chest, Front perineal area,  Abdomen, Buttocks, Right upper leg, Left upper leg, Right lower leg, Left lower leg, Face         Bathing assist Assist Level: Minimal Assistance - Patient > 75%     Upper Body Dressing/Undressing Upper body dressing   What is the patient wearing?: Pull over shirt    Upper body assist Assist Level: Supervision/Verbal cueing    Lower Body Dressing/Undressing Lower body dressing      What is the patient wearing?: Pants, Underwear/pull up     Lower body assist Assist for lower body dressing: Minimal Assistance - Patient > 75%     Toileting Toileting    Toileting assist Assist for toileting: Minimal Assistance - Patient > 75%     Transfers Chair/bed transfer  Transfers assist     Chair/bed transfer assist level: Contact Guard/Touching assist     Locomotion Ambulation   Ambulation assist      Assist level: Minimal Assistance - Patient > 75% Assistive device: Walker-rolling Max distance: 150   Walk 10 feet activity   Assist     Assist level: Minimal Assistance - Patient > 75% Assistive device: Walker-rolling   Walk 50 feet activity   Assist    Assist level: Minimal Assistance - Patient > 75% Assistive device: Walker-rolling    Walk 150 feet activity   Assist    Assist level: Minimal Assistance - Patient > 75% Assistive device: Walker-rolling    Walk 10 feet on uneven surface  activity   Assist     Assist level: Minimal Assistance - Patient > 75% Assistive device: Development worker, international aid     Assist Is the patient using a wheelchair?: Yes Type of Wheelchair: Manual    Wheelchair assist level: Minimal Assistance - Patient > 75% Max wheelchair distance: 150'    Wheelchair 50 feet with 2 turns activity    Assist        Assist Level: Minimal Assistance - Patient > 75%   Wheelchair 150 feet activity     Assist      Assist Level: Minimal Assistance - Patient > 75%   Blood pressure 97/66, pulse 75,  temperature 97.6 F (36.4 C), resp. rate 18, height 6\' 1"  (1.854 m), weight 89.7 kg, SpO2 91 %.   Medical Problem List and Plan: 1. Functional deficits secondary to cerebellar CVA, c/b recent pseudomeningocele repair and Hx AVM resection December 2023..     - 4/4: Note Dr. Alanson Puls stating patient likely did, in fact, have a cerebellar stroke and not just post-operative changes as previously noted. Changing primary Dx to cerebellar infarct.           -patient may shower             -ELOS/Goals: 7-10 days, Mod I PT/OT/SLP; DC goal extended to 4/23             -Continue CIR -Decadron as indicated 4 MG Q8 HOURS with TAPER STARTING 3/29 to 4/10 - 4/3: Worsening hydrocephalus on CT ->VP shunt placment 4/5 -> adjusted 4/8 to 1.0 with some subjective improvement in ataxia on exam  - 4/10: CT head for worsening headaches,  stable.  Treatment as below.   - 4-16: Vestibular evaluation by PT-per their assessment, difficulty with smooth pursuit and saccades, more prominent in R eye; complicated by poor insight/awareness of when his eyes are not actually on the target (if he is seeing it slightly from his peripheral vision)   2.  Antithrombotics: -DVT/anticoagulation:  Pharmaceutical: Lovenox  QD -antiplatelet therapy: Aspirin 81 mg daily- currently held 08/09/22 d/t surgery, spoke with Dr. Conchita Paris 08/09/22, states it should restart in 1-2 days - > resume 4/9  3. Pain Management: Tylenol as needed, Diazepam  q8h PRN muscle spasms, Voltaren 1% 2g QID - 3/29: Schedule Tylenol 1000 mg TID, add gabapentin 300 mg QHS for headache and sleep -08/02/22 pt previously on Hydrocodone-APAP 5-325mg  at prior discharge, was still using this, would like to keep; reordered for now, 5-325mg  q6h PRN x7 days, if needed longer then weekday team to discuss; adjusted tylenol to  TID to avoid overdosing of tylenol; would also prefer gabapentin be scheduled BID rather than PRN -08/03/22 - 4/1 improved control, cont regimen  for now -08/09/22 Hydrocodone 5-325mg  q6h PRN readded since he had surgery again yesterday; also added morphine  q4h PRN in case he needs IV pain control postoperatively; monitor closely -08/11/22 pain ongoing, monitor and convert to PO regimen in AM. For HA, Increase gabapentin from 300 mg BID to 300/300/600 -Sleep and headache improved with gabapentin, monitor -4/10: Headache worse, diffuse this a.m.  CT head stable.  Patient did not tolerate discontinuation of as needed IV morphine.  Scheduled Norco every 6, added Topamax 25 mg twice daily, reduce morphine to 2 mg IV twice daily as needed. -4/10: Maintain Topamax 25 mg twice daily, Norco scheduled.  Increase gabapentin to 600 mg 3 times daily, increase as needed morphine to 3 mg IV twice daily.  Goal to wean off IV off by the end of the week. -4/12: Increase topomax to 50 mg BID. Weaning morphine PRN over the weekend to 2 mg BID PRN 4/13, 1 mg BID PRN 4/14, then DC.   -4/13-14/24 pain managed for now, monitor with weaning morphine   - 4/15: Weaned off of morphine starting today, continue scheduled pain regimen with plan to wean down later this week. ?  Association with pressure on pseudomeningocele in the back of the head.  May benefit from a cervical collar, however this could also increase the pressure make it worse.  Will look at repositioning options today.  4/16: Switch gabapentin 600 mg 3 times daily to Lyrica 150 mg twice daily.  Read time 4 times daily Norco 5 mg to allow sleep from 2200 to 0600.  Added on at 2 additional PRNs of oxycodone 5 mg for pain control if needed with longer intervals during sleep.  Discontinue Topamax due to no benefit and possible effects on mood.  4-17: Improved, continue current regimen.  Using both PRNs.  Worsens with activity.  4/18: Spoke with nursing regarding nighttime interruptions for scheduled medications. DC staples today.   4-19: Slept well overnight, no interruptions, using as needed so we will maintain  current regimen over the weekend and further wean Monday.   4. Mood/Behavior/Sleep: Zoloft 50 mg daily             -antipsychotic agents: N/A   -3/29: Gabapentin as above, melatonin  QHS PRN  -08/02/22 scheduled Gabapentin BID and melatonin QHS per request  -08/03/22 greatly improved sleep - continue  -4/2: Added trazodone 50 mg PRN for ?insomnia; not endorsed by patient.  -  4/3: Patient endorses ongoing insomnia, uncertain etiology.  As needed trazodone not utilized, will schedule starting tonight. - sleep stable -08/09/22 slept poorly but suspect could be from missing some meds yesterday and postop recovery; monitor for now -08/10/22 sleep gradually improving, monitor - 4/8: remains poor d.t HA, pain; tx as above - 4/9: Endorses worsening depression, more labile mood secondary to frustration regarding his diagnoses.  Agreeable to increasing antidepressant sertraline from 50 to 75 mg.  Could also consider switching to BuSpar given some studies showing benefit for cerebellar ataxia. - 4/16: Patient endorses significant difficulty with adjustment and depression regarding perceived failure to progress, feeling like things will not get better.  Did encourage him regarding his daily functional games, and talked about stroke recovery extending up to a year.  No SI/HI.  Sertraline increased as above, will take a while to appreciate effects.  5. Neuropsych/cognition: This patient is capable of making decisions on his own behalf. 6. Skin/Wound Care: Routine skin checks 7. Dysphagia/Fluids/Electrolytes/Nutrition: Routine in and outs with follow-up chemistries  - 3/29: Cr stable, BUN increased, encourage PO fluids and re-test Monday--ordered weekly; stable CR, BUN improved  4/1; stable 4/3; stable 4/8 -08/02/22 takes Vitamin D 2000IU QD and Calcium supplement at home; reordered -08/10/22 pt dislikes diet order/purees and refuses to eat it, would prefer to go back to prior diet (bacon, eggs, mashed potatoes)-- not  on SLP schedule today but spoke with speech therapy and asked if they could possibly do a quick eval to see if he can advance his diet today-- they will try to get to him today - 4.8: diet advanced to Dys 3; pt reports improved POS -4/10: Nutrition evaluated, meeting nutritional needs.  Supplements as needed.  Monitor -4/12: wet vocal quality and feel secretions interfering with POs; add loratidine 1 tab daily for drying -mildly improved, may remove later this week 4/15: Creatinine uptrending, although BUN remains stable and other electrolytes look good.  Encourage p.o. fluids.  Retest Thursday.  - 4/16: Will discuss possibility of utilizing provale cup to help with p.o. intakes, as patient remains under supervision of fluid intakes and this is limiting his ability to drink.  Did have mildly improved p.o.'s today of 580 cc. 4/17: Eating 40 to 50% of meals, p.o.'s remain about 400 to 500 cc.  Unable to use Provale  Supervision only.  Discontinue loratadine, vocal quality improved. 4/18: Advanced to free water protocol; patient refuses Provale  8.  Hyperlipidemia.  Crestor 20mg  daily 9.  Rheumatoid arthritis.  Consider resuming home regimen of rituximab, methotrexate and sulfasalazine after steroids completed (4/10).  Currently on Dexamethasone with taper. -08/02/22 wife concerned with restarting of meds; advised to readdress this once steroids have tapered around 08/13/22 -4-10: Wife requesting resumption of rituximab, methotrexate, sulfasalazine.  Theoretical risk of increased post-operative infection, however on lit review appears minimal. Will get CBC, CMP in AM to ensure WBC count decreases in setting of completed steroid taper, then resume. - 4-11: No signs or symptoms of infection, white count downtrending after steroid treatment completed.  Will touch base with rheum (Dr. Corliss Skains) about restarting regimen; no direct contrainidications identified.  -4/12: Per rheumatology, can restart all  immunologics 2 weeks post-op (4/19) -ordered to start 4/20 AM with sulfasalazine 1000 mg twice daily, methotrexate 20 mg q. weekly; defer resumption of rituximab to outpatient rheumatologist.  Family in agreement.   10.  CAD.  Continue low-dose aspirin. Denies CP 11.  Dysphagia. Regular diet with nectar liquids. Follow up SLP 12.  Chronic systolic congestive heart failure.  Monitor for any signs of fluid overload. Daily weight. Currently HOLDING Spironolactone 12.5mg  QD (halved during last hospital stay), Lisinopril 10mg  QD, Metoprolol 25mg  QD  -3/30-4/5 wt stable, BP stable, continue to monitor -08/09/22 wt stable, BPs soft overnight but somewhat close to prior, improved this morning; monitor closely tonight, may need gentle hydration, hold off on labs for now -4/7-11/24 BPs stable monitor -4/11: Weights downtrending. BP stable. Monitor.  -4/12: mild hypotension today, asymptomatic. PO fluids poor d/t thickened liquids. BMP for AM. Encourage fludis -08/16/22 wt stable, BP still soft but stable, BMP unremarkable; monitor -08/17/22 wt/BP stable, cont to monitor  - 4/15: Intermittent hypotension, encourage p.o. fluids.  Patient endorses he is asymptomatic, requests DC IV today. 08-18-16: Blood pressures improved with better p.o. intakes, monitor Filed Weights   08/19/22 0541 08/21/22 0500 08/22/22 0739  Weight: 88.4 kg 88.6 kg 89.7 kg   Vitals:   08/18/22 1511 08/18/22 1927 08/19/22 0543 08/19/22 1321  BP: 116/83 110/79 110/62 104/70   08/19/22 1935 08/20/22 0427 08/20/22 1403 08/20/22 1937  BP: 103/63 105/78 102/75 138/64   08/21/22 0514 08/21/22 1601 08/21/22 1950 08/22/22 0513  BP: (!) 103/53 (!) 107/54 108/63 97/66     13.  OSA. Intolerance to CPAP. Counseling on CPAP benefits.  14. Prediabetes. A1C 6.0. Monitor on CBC/CMP 15. Leukocytosis. Likely due to steroids. Stable. Monitor for s/s infection.    - 10.2->11->10.6 -> 12 4/3 ; monitor   - 4/8: 12.9 -> 11 4/11-.  9.3 4-15,  normal  16. Macular degeneration: takes Preservision AREDS2 and Systane 0.6% eye drops at home -08/02/22 ordered hospital formulary alternatives- Lacrilube BID and Prosight multivitamin QD - 4/1-4/2: Wife concerned regarding use of alternative to systane; recommended bringing in from home  - 4/4: Pharmacy to assist in nonformulary order for home systane; ordered  17. R wrist bruising - stable, improving -08/02/22 wife concerned with bruises to R wrist, appear older/healing, no tenderness or crepitus/deformities, no swelling. Suspect minor pressure related bruise, doubt underlying bony injury, doubt need for xray; likely related to blood thinners; monitor for now  - 4/2: Mild HgB drop 10.6->9.8-> 10.1 -> 9.9 post-op  18.  Hydrocephalus s/p AVM resection and pseudomeningocele repair.   - 4/3: Cognitive slowing, increased dysarthria and ataxia.  See above, CT head and labs today for workup.  No focal weakness or sensory changes, no gross cranial nerve deficits to indicate need for acute stroke workup.  Last known normal would have been at bedtime last night. - 4/4: CT head concerning for worsening hydrocephalus. See above.  - 4/5:  Dr. Alanson Puls and Dr. Jake Samples recommending VP shunt, family agreeable, plan to OR 4/5 and return to IPR if no complications -08/09/22 VP shunt placed last night, doing well, some mild abd discomfort but nondistended and no exquisite tenderness; pain control as above - 4.8: pending neurosurgery eval, per note may consider decreasing Strata II valve setting tomorrow to 1.0 or 0.5-adjusted to 1.0, with improvements - 4/10: CT head stable; Tx headache as above. Discussed with wife that imaging may take some time post-op to improve.  - 4/12: If no significant improvement by Monday, will ask for Dr. Conchita Paris to re-evaluate shunt settings (currently on 1.0) -4/15 -significant improvement in ataxia in right upper extremity and voice today.  Pseudomeningocele appears to be decreasing,  continue to monitor. 4-16: Dr. Conchita Paris evaluated shunt overnight, no new recommendations. 4-18: Staples removed.    LOS: 22 days A FACE TO FACE  EVALUATION WAS PERFORMED  Christopher Yu 08/22/2022, 1:12 PM

## 2022-08-22 NOTE — Progress Notes (Addendum)
Speech Language Pathology Daily Session Note  Patient Details  Name: Christopher Yu MRN: [id number] Date of Birth: 1960/05/31  Today's Date: 08/22/2022 SLP Individual Time: 4098-1191 SLP Individual Time Calculation (min): 53 min  Short Term Goals: Week 4: SLP Short Term Goal 1 (Week 4): STGs=LTGs due to ELOS  Skilled Therapeutic Interventions:   Pt seen for skilled SLP intervention to address dysphagia goals and dysphagia education. Reviewed information and provided handouts on thickened liquids for d/c, oral care, water protocol, and pharyngeal strengthening exercises. Pt effectively completed chin tuck against resistance x5 held for variable amounts of time ranging from 2-10 seconds. Encouraged pt to complete this exercise along with pitch glides, shaker head lifts, and suprasubglottic swallow daily. Provided information re: recommendations for repeat MBSS. Would recommend waiting x3-4 weeks for next MBSS; however, defer to outpatient SLP for recommendations. Pt and wife verbalized excellent understanding. All questions pertaining to dysphagia answered at this time. Continue SLP PoC.   Pt reported distaste of crushed pills. Pt okay to try pills whole in puree snack. RN updated. Orders updated. Swallow signs updated.   Pain Pain Assessment Pain Scale: 0-10 Pain Score: 0-No pain  Therapy/Group: Individual Therapy  Ellery Plunk 08/22/2022, 9:54 AM

## 2022-08-22 NOTE — Progress Notes (Signed)
Occupational Therapy Session Note  Patient Details  Name: Christopher Yu MRN: [id number] Date of Birth: Nov 09, 1960  Today's Date: 08/22/2022 OT Individual Time: 1002-1045 OT Individual Time Calculation (min): 43 min    Short Term Goals: Week 4:  OT Short Term Goal 1 (Week 4): LTG=STG 2.2 ELOS  Skilled Therapeutic Interventions/Progress Updates:    Pt greeted seated in wc with spouse present. Pt needed encouragement to shower for family education with spouse. Pt with very poor frustration tolerance initially during session. OT and Wife Christopher Yu set up bathroom to simulate home environment using BSC as shower seat. Discussed installation of grab bars and HH shower hose. Pt then ambulated into bathroom w/ RW and CGA. Pt undressed from [facility] with CGA when standing. OT had pt practice squat/ semi-stand to wash buttocks for safety which he demonstrated understanding. Dressing tasks completed from wc with overall CGA when standing to pull up pants. Overall much more coordinated with all BADL tasks. Pt left seated in wc with spouse present and needs met.   Therapy Documentation Precautions:  Precautions Precautions: Fall Restrictions Weight Bearing Restrictions: No Other Position/Activity Restrictions: Ataxia, impulsive, impaired problem-solving Pain: Pain Assessment Pain Scale: 0-10 Pain Score: 0-No pain    Therapy/Group: Individual Therapy  Mal Amabile 08/22/2022, 10:52 AM

## 2022-08-22 NOTE — Progress Notes (Addendum)
Patient ID: Christopher Yu, male   DOB: 1960/10/12, 62 y.o.   MRN: [id number]  SW received phone call from pt wife to inquire about DME- 3in1 BSC and w/c. SW will order items. She asks for letter for employer indicating pt husband's clinical diagnosis, needs he will have, and expected time period in which she will be out of the office (employer does not have FMLA). SW discussed outpatient rehab process and follow-up. SW reviewed discharge.  SW ordered DME with [employer] via parachute.  SW left letter from attending in room on tray; pt was sleeping at time of visit.   Cecile Sheerer, MSW, LCSWA Office: [phone] Cell: [phone] Fax: [phone]

## 2022-08-22 NOTE — Plan of Care (Signed)
Goals downgraded to more accurately reflect patient's CLOF and project level of function at discharge, as well as the amount of assistance he will receive at home.   Problem: Sit to Stand Goal: LTG:  Patient will perform sit to stand with assistance level (PT) Description: LTG:  Patient will perform sit to stand with assistance level (PT) Flowsheets (Taken 08/22/2022 1418) LTG: PT will perform sit to stand in preparation for functional mobility with assistance level: (SR 4/19) Contact Guard/Touching assist Note: Goals downgraded to more accurately reflect CLOF and assistance provided at home- SR 4/19   Problem: RH Bed to Chair Transfers Goal: LTG Patient will perform bed/chair transfers w/assist (PT) Description: LTG: Patient will perform bed to chair transfers with assistance (PT). Flowsheets (Taken 08/22/2022 1418) LTG: Pt will perform Bed to Chair Transfers with assistance level: (SR 4/19) Contact Guard/Touching assist Note: Goals downgraded to more accurately reflect CLOF and assistance provided at home- SR 4/19   Problem: RH Car Transfers Goal: LTG Patient will perform car transfers with assist (PT) Description: LTG: Patient will perform car transfers with assistance (PT). Flowsheets (Taken 08/22/2022 1418) LTG: Pt will perform car transfers with assist:: (SR 4/19) Contact Guard/Touching assist Note: Goals downgraded to more accurately reflect CLOF and assistance provided at home- SR 4/19   Problem: RH Ambulation Goal: LTG Patient will ambulate in home environment (PT) Description: LTG: Patient will ambulate in home environment, # of feet with assistance (PT). Flowsheets (Taken 08/22/2022 1418) LTG: Pt will ambulate in home environ  assist needed:: (SR 4/19) Contact Guard/Touching assist Note: Goals downgraded to more accurately reflect CLOF and assistance provided at home- SR 4/19 Goal: LTG Patient will ambulate in community environment (PT) Description: LTG: Patient will ambulate in  community environment, # of feet with assistance (PT). Flowsheets (Taken 08/22/2022 1418) LTG: Pt will ambulate in community environ  assist needed:: (SR 4/19) Contact Guard/Touching assist Note: Goals downgraded to more accurately reflect CLOF and assistance provided at home- SR 4/19   Problem: RH Stairs Goal: LTG Patient will ambulate up and down stairs w/assist (PT) Description: LTG: Patient will ambulate up and down # of stairs with assistance (PT) Flowsheets (Taken 08/22/2022 1418) LTG: Pt will ambulate up/down stairs assist needed:: (SR 4/19) Contact Guard/Touching assist Note: Goals downgraded to more accurately reflect CLOF and assistance provided at home- SR 4/19

## 2022-08-22 NOTE — Progress Notes (Signed)
Physical Therapy Weekly Progress Note  Patient Details  Name: Christopher Yu MRN: [id number] Date of Birth: 1961/04/15  Beginning of progress report period: August 01, 2022 End of progress report period: August 22, 2022  Today's Date: 08/22/2022 PT Individual Time: 1st Treatment Session: [id number]; 2nd Treatment Session: [id number] PT Individual Time Calculation (min): 45 min; 75 min  No new STGs were created for this patient secondary to CLOF and projected level of function at discharge. Patient currently requires CGA/SBA with all functional mobility with the use of a RW. Patient continues to be limited by his impaired awareness to deficits, poor problem-solving/safety awareness, impaired dynamic stability and impaired visual scanning.   Patient continues to demonstrate the following deficits muscle weakness, decreased cardiorespiratoy endurance, ataxia and decreased coordination, decreased visual perceptual skills, decreased midline orientation and decreased motor planning, decreased initiation, decreased attention, decreased awareness, decreased problem solving, decreased safety awareness, decreased memory, and delayed processing, and decreased standing balance and decreased balance strategies and therefore will continue to benefit from skilled PT intervention to increase functional independence with mobility.  Patient not progressing toward long term goals.  See goal revision..  Plan of care revisions: Goals downgraded to CGA, which patient will have at home.  PT Short Term Goals Week 1:  PT Short Term Goal 1 (Week 1): Patient will perform bed/chair transfer with LRAD and CGA PT Short Term Goal 1 - Progress (Week 1): Progressing toward goal PT Short Term Goal 2 (Week 1): Patient will ambulate >40' with LRAD and CGA PT Short Term Goal 2 - Progress (Week 1): Progressing toward goal PT Short Term Goal 3 (Week 1): Patient will ascend/descend > a flight of stairs with R HR and CGA PT  Short Term Goal 3 - Progress (Week 1): Progressing toward goal Week 2:  PT Short Term Goal 1 (Week 2): Patient will perform bed/chair transfer with LRAD and CGA PT Short Term Goal 1 - Progress (Week 2): Met PT Short Term Goal 2 (Week 2): Patient will ambulate >32' with LRAD and CGA PT Short Term Goal 2 - Progress (Week 2): Met PT Short Term Goal 3 (Week 2): Patient will ascend/descend > a flight of stairs with R HR and CGA PT Short Term Goal 3 - Progress (Week 2): Met Week 3:  PT Short Term Goal 1 (Week 3): STGs=LTGs secondary to ELOS Week 4:  PT Short Term Goal 1 (Week 4): STGs=LTGs secondary to ELOS  Skilled Therapeutic Interventions/Progress Updates:  1st Treatment Session- Patient greeted supine in bed finishing breakfast with spouse, Christopher Yu, present and agreeable to PT treatment session. Treatment session focused on hands-on family training in order to ensure a safe discharge. Therapist discussed with Christopher Yu regarding need for manual wheelchair vs transport chair with spouse stating she would prefer patient have a manual wheelchair for discharge home.   Patient transitioned to sitting EOB ModI- While sitting EOB, patient donned tennis shoes. Patient stood from EOB with RW and SBA- Patient performed stand pivot transfer to wheelchair with SBA. Patient wheeled to rehab gym for time management and energy conservation.   Patient performed car transfers, ambulating up/down a low grade ramp, gait on level surfaces 2 x 120' and ascending/descending 4 steps with R HR (in order to simulate home entrance) all with the use of a RW and CGA from his spouse, Christopher Yu. Therapist and Christopher Yu discussed home safety, RW management, discharge planning, patient posturing, HEP, continued f/u services, home environment/layout, etc.   Patient left sitting upright in wheelchair  in room with Christopher Yu present, ST present, call bell within reach and all needs met.    2nd Treatment Session- Patient greeted sitting upright in wheelchair  in room with spouse present and agreeable to PT treatment session. Patient wheeled to rehab gym for time management and energy conservation.   Patient tasked with standing on foam without UE support and reaching across his body for bean bags and tossing them a the corn hole board. Patient performed 2 x 15 total with seated rest break in between. Patient successfully made 7 bean bags each time.   Patient performed alternating step-ups to airex foam pad without UE support and CGA/MinA for steadying assist- Patient with increased difficulty when stepping with R LE compared to L LE.   Patient tasked with ambulating around the RN/day room loop with RW while visually scanning for various colored cones- Patient was able to locate all 6 cones independently and demonstrate improved safety awareness throughout by stepping close to each item prior to reaching out for them.   Patient gait trained x63', x87' without the use of an AD and CGA/MinA for improved safety/stability- VC for improved postural extension and B foot clearance throughout gait trials as patient tends to demonstrate a propulsive gait pattern with increased anterior lean.   Patient gait trained x360' with RW and CGA/SBA for safety- Minor VC/TC throughout for improved postural extension and R foot clearance as patient fatigued.   Patient ambulated to/from the Nustep with RW and completed the Nustep x8 minutes on level 8 with B LE only (as patient does not like using his UE with this machine) in order to improve B LE strength and coordination.   Patient returned to his room via wheelchair and performed sit/stand and stand pivot transfer without the use of an AD and CGA. Patient left supine in bed with call bell within reach, bed alarm on and all needs met.    Therapy Documentation Precautions:  Precautions Precautions: Fall Restrictions Weight Bearing Restrictions: No Other Position/Activity Restrictions: Ataxia, impulsive, impaired  problem-solving  Therapy/Group: Individual Therapy  Analiya Porco 08/22/2022, 7:47 AM

## 2022-08-23 NOTE — Progress Notes (Signed)
PROGRESS NOTE   Subjective/Complaints:  Pt feeling well today, slept much better last night, pain improving, LBM yesterday, urinating well, denies any other complaints/concerns at this time.   ROS:  + Adjustment difficulty with depressed mood - improving +dysarthria c/b wet vocal quality-improved +HAs/postop head pain-ongoing, improving +insomnia-resolved with medication retiming Denies fevers, chills, abd pain, N/V, constipation, diarrhea, SOB, cough, chest pain, new weakness or paraesthesias.    Objective:   No results found. No results for input(s): "WBC", "HGB", "HCT", "PLT" in the last 72 hours.   No results for input(s): "NA", "K", "CL", "CO2", "GLUCOSE", "BUN", "CREATININE", "CALCIUM" in the last 72 hours.   Intake/Output Summary (Last 24 hours) at 08/23/2022 1307 Last data filed at 08/23/2022 0835 Gross per 24 hour  Intake 596 ml  Output 675 ml  Net -79 ml        Physical Exam: Vital Signs Blood pressure 90/61, pulse 65, temperature (!) 97.1 F (36.2 C), resp. rate 18, height  (1.854 m), weight 87.7 kg, SpO2 95 %.    General: alert, sitting up in bed, no acute distress  HEENT:  R scalp  - shunt in place, staples s/p removal.  PERRLA.   4/17: Right beating nystagmus, most apparent on leftward gaze. Some difficulty with convergence - stable--not reassessed today 08/23/22  Neck: Supple without JVD Heart: Reg rate and rhythm. No murmurs rubs or gallops Chest: CTA bilaterally without wheezes, rales, or rhonchi; no distress.  Abdomen: Soft, nonTTP, non-distended, bowel sounds positive. Multiple laparoscopic incisions with glue, c/d/i Extremities: No clubbing, cyanosis, or edema.   Psych: Pt's affect is appropriate . Pt is cooperative.   Neuro: speech apraxia and dysarthria slowly improving from prior exams  PRIOR EXAMS: Skin: Clean and intact without signs of breakdown,  healed incision/scar to  posterior neck, with fluctuant fluid-stable  + Staples removed, shunt site c/d/I.  Minimal bruising along shunt trajectory through the side of his neck. + Abdominal trochar sites well approximated with dermabond, c/d/I.  No tenderness to palpation Neuro: Alert and oriented x 4. + Right facial droop -ongoing  + Speech apraxia and dysarthria -slowly improving  strength antigravity and against resistance in bilateral upper extremities and lower extremities Sensation intact light touch in all 4 extremities + Tender to palpation around the fluctuant fluid side and throughout the neck, bilateral temples, and forehead. -improved tolerance for palpation + FTN  altered  improving daily-mild bilaterally, much improved Musculoskeletal: Normal bulk and tone, no joint swelling noted.  Moving all 4 limbs antigravity and against resistance.    Assessment/Plan: 1. Functional deficits which require 3+ hours per day of interdisciplinary therapy in a comprehensive inpatient rehab setting. Physiatrist is providing close team supervision and 24 hour management of active medical problems listed below. Physiatrist and rehab team continue to assess barriers to discharge/monitor patient progress toward functional and medical goals  Care Tool:  Bathing    Body parts bathed by patient: Right arm, Left arm, Chest, Front perineal area, Abdomen, Buttocks, Right upper leg, Left upper leg, Right lower leg, Left lower leg, Face         Bathing assist Assist Level: Minimal Assistance - Patient > 75%  Upper Body Dressing/Undressing Upper body dressing   What is the patient wearing?: Pull over shirt    Upper body assist Assist Level: Supervision/Verbal cueing    Lower Body Dressing/Undressing Lower body dressing      What is the patient wearing?: Pants, Underwear/pull up     Lower body assist Assist for lower body dressing: Minimal Assistance - Patient > 75%     Toileting Toileting    Toileting  assist Assist for toileting: Contact Guard/Touching assist     Transfers Chair/bed transfer  Transfers assist     Chair/bed transfer assist level: Contact Guard/Touching assist     Locomotion Ambulation   Ambulation assist      Assist level: Minimal Assistance - Patient > 75% Assistive device: Walker-rolling Max distance: 150   Walk 10 feet activity   Assist     Assist level: Minimal Assistance - Patient > 75% Assistive device: Walker-rolling   Walk 50 feet activity   Assist    Assist level: Minimal Assistance - Patient > 75% Assistive device: Walker-rolling    Walk 150 feet activity   Assist    Assist level: Minimal Assistance - Patient > 75% Assistive device: Walker-rolling    Walk 10 feet on uneven surface  activity   Assist     Assist level: Minimal Assistance - Patient > 75% Assistive device: Development worker, international aid     Assist Is the patient using a wheelchair?: Yes Type of Wheelchair: Manual    Wheelchair assist level: Minimal Assistance - Patient > 75% Max wheelchair distance: 150'    Wheelchair 50 feet with 2 turns activity    Assist        Assist Level: Minimal Assistance - Patient > 75%   Wheelchair 150 feet activity     Assist      Assist Level: Minimal Assistance - Patient > 75%   Blood pressure 90/61, pulse 65, temperature (!) 97.1 F (36.2 C), resp. rate 18, height 6\' 1"  (1.854 m), weight 87.7 kg, SpO2 95 %.   Medical Problem List and Plan: 1. Functional deficits secondary to cerebellar CVA, c/b recent pseudomeningocele repair and Hx AVM resection December 2023..     - 4/4: Note Dr. Alanson Puls stating patient likely did, in fact, have a cerebellar stroke and not just post-operative changes as previously noted. Changing primary Dx to cerebellar infarct.           -patient may shower             -ELOS/Goals: 7-10 days, Mod I PT/OT/SLP; DC goal extended to 4/23             -Continue CIR -Decadron  as indicated 4 MG Q8 HOURS with TAPER STARTING 3/29 to 4/10 - 4/3: Worsening hydrocephalus on CT ->VP shunt placment 4/5 -> adjusted 4/8 to 1.0 with some subjective improvement in ataxia on exam  - 4/10: CT head for worsening headaches, stable.  Treatment as below. - 4-16: Vestibular evaluation by PT-per their assessment, difficulty with smooth pursuit and saccades, more prominent in R eye; complicated by poor insight/awareness of when his eyes are not actually on the target (if he is seeing it slightly from his peripheral vision)   2.  Antithrombotics: -DVT/anticoagulation:  Pharmaceutical: Lovenox 40mg  QD -antiplatelet therapy: Aspirin 81 mg daily- currently held 08/09/22 d/t surgery, spoke with Dr. Conchita Paris 08/09/22, states it should restart in 1-2 days - > resume 4/9  3. Pain Management: Tylenol as needed, Diazepam 5mg  q8h PRN muscle spasms,  Voltaren 1% 2g QID - 3/29: Schedule Tylenol 1000 mg TID, add gabapentin 300 mg QHS for headache and sleep -08/02/22 pt previously on Hydrocodone-APAP 5-325mg  at prior discharge, was still using this, would like to keep; reordered for now, 5-325mg  q6h PRN x7 days, if needed longer then weekday team to discuss; adjusted tylenol to 650mg  TID to avoid overdosing of tylenol; would also prefer gabapentin be scheduled BID rather than PRN -08/03/22 - 4/1 improved control, cont regimen for now -08/09/22 Hydrocodone 5-325mg  q6h PRN readded since he had surgery again yesterday; also added morphine 4mg  q4h PRN in case he needs IV pain control postoperatively; monitor closely -08/11/22 pain ongoing, monitor and convert to PO regimen in AM. For HA, Increase gabapentin from 300 mg BID to 300/300/600 -Sleep and headache improved with gabapentin, monitor -4/10: Headache worse, diffuse this a.m.  CT head stable.  Patient did not tolerate discontinuation of as needed IV morphine.  Scheduled Norco every 6, added Topamax 25 mg twice daily, reduce morphine to 2 mg IV twice daily as  needed. -4/10: Maintain Topamax 25 mg twice daily, Norco scheduled.  Increase gabapentin to 600 mg 3 times daily, increase as needed morphine to 3 mg IV twice daily.  Goal to wean off IV off by the end of the week. -4/12: Increase topomax to 50 mg BID. Weaning morphine PRN over the weekend to 2 mg BID PRN 4/13, 1 mg BID PRN 4/14, then DC.   -4/13-14/24 pain managed for now, monitor with weaning morphine - 4/15: Weaned off of morphine starting today, continue scheduled pain regimen with plan to wean down later this week. ?  Association with pressure on pseudomeningocele in the back of the head.  May benefit from a cervical collar, however this could also increase the pressure make it worse.  Will look at repositioning options today. -4/16: Switch gabapentin 600 mg 3 times daily to Lyrica 200 mg twice daily.  Read time 4 times daily Norco 5 mg to allow sleep from 2200 to 0600.  Added on at 2 additional PRNs of oxycodone 5 mg for pain control if needed with longer intervals during sleep.  Discontinue Topamax due to no benefit and possible effects on mood. -4-17: Improved, continue current regimen.  Using both PRNs.  Worsens with activity. -4/18: Spoke with nursing regarding nighttime interruptions for scheduled medications. DC staples today.  -4-19: Slept well overnight, no interruptions, using as needed so we will maintain current regimen over the weekend and further wean Monday.   4. Mood/Behavior/Sleep: Zoloft 50 mg daily>> inc to 75mg  QD             -antipsychotic agents: N/A   -3/29: Gabapentin as above, melatonin 5mg  QHS PRN  -08/02/22 scheduled Gabapentin BID and melatonin QHS per request  -08/03/22 greatly improved sleep - continue  -4/2: Added trazodone 50 mg PRN for ?insomnia; not endorsed by patient.  -4/3: Patient endorses ongoing insomnia, uncertain etiology.  As needed trazodone not utilized, will schedule starting tonight. - sleep stable -08/09/22 slept poorly but suspect could be from  missing some meds yesterday and postop recovery; monitor for now -08/10/22 sleep gradually improving, monitor - 4/8: remains poor d.t HA, pain; tx as above - 4/9: Endorses worsening depression, more labile mood secondary to frustration regarding his diagnoses.  Agreeable to increasing antidepressant sertraline from 50 to 75 mg.  Could also consider switching to BuSpar given some studies showing benefit for cerebellar ataxia. - 4/16: Patient endorses significant difficulty with adjustment and depression  regarding perceived failure to progress, feeling like things will not get better.  Did encourage him regarding his daily functional games, and talked about stroke recovery extending up to a year.  No SI/HI.  Sertraline increased as above, will take a while to appreciate effects. -08/23/22 sleep improving, cont regimen  5. Neuropsych/cognition: This patient is capable of making decisions on his own behalf. 6. Skin/Wound Care: Routine skin checks 7. Dysphagia/Fluids/Electrolytes/Nutrition: Routine in and outs with follow-up chemistries  - 3/29: Cr stable, BUN increased, encourage PO fluids and re-test Monday--ordered weekly; stable CR, BUN improved  4/1; stable 4/3; stable 4/8 -08/02/22 takes Vitamin D 2000IU QD and Calcium supplement at home; reordered -08/10/22 pt dislikes diet order/purees and refuses to eat it, would prefer to go back to prior diet (bacon, eggs, mashed potatoes)-- not on SLP schedule today but spoke with speech therapy and asked if they could possibly do a quick eval to see if he can advance his diet today-- they will try to get to him today - 4.8: diet advanced to Dys 3; pt reports improved POS -4/10: Nutrition evaluated, meeting nutritional needs.  Supplements as needed.  Monitor -4/12: wet vocal quality and feel secretions interfering with POs; add loratidine 1 tab daily for drying -mildly improved, may remove later this week 4/15: Creatinine uptrending, although BUN remains stable and  other electrolytes look good.  Encourage p.o. fluids.  Retest Thursday.  - 4/16: Will discuss possibility of utilizing provale cup to help with p.o. intakes, as patient remains under supervision of fluid intakes and this is limiting his ability to drink.  Did have mildly improved p.o.'s today of 580 cc. -4/17: Eating 40 to 50% of meals, p.o.'s remain about 400 to 500 cc.  Unable to use Provale  Supervision only.  Discontinue loratadine, vocal quality improved. -4/18: Advanced to free water protocol; patient refuses Provale  8.  Hyperlipidemia.  Crestor  daily 9.  Rheumatoid arthritis.  Consider resuming home regimen of rituximab, methotrexate and sulfasalazine after steroids completed (4/10).  Currently on Dexamethasone with taper. -08/02/22 wife concerned with restarting of meds; advised to readdress this once steroids have tapered around 08/13/22 -4-10: Wife requesting resumption of rituximab, methotrexate, sulfasalazine.  Theoretical risk of increased post-operative infection, however on lit review appears minimal. Will get CBC, CMP in AM to ensure WBC count decreases in setting of completed steroid taper, then resume. - 4-11: No signs or symptoms of infection, white count downtrending after steroid treatment completed.  Will touch base with rheum (Dr. Corliss Skains) about restarting regimen; no direct contrainidications identified.  -4/12: Per rheumatology, can restart all immunologics 2 weeks post-op (4/19) -ordered to start 4/20 AM with sulfasalazine 1000 mg twice daily, methotrexate 20 mg q. weekly; defer resumption of rituximab to outpatient rheumatologist.  Family in agreement.   10.  CAD.  Continue low-dose aspirin. Denies CP 11.  Dysphagia. Regular diet with nectar liquids. Follow up SLP 12.  Chronic systolic congestive heart failure.  Monitor for any signs of fluid overload. Daily weight. Currently HOLDING Spironolactone 12.5mg  QD (halved during last hospital stay), Lisinopril  QD,  Metoprolol  QD  -3/30-4/5 wt stable, BP stable, continue to monitor -08/09/22 wt stable, BPs soft overnight but somewhat close to prior, improved this morning; monitor closely tonight, may need gentle hydration, hold off on labs for now -4/7-11/24 BPs stable monitor -4/11: Weights downtrending. BP stable. Monitor.  -4/12: mild hypotension today, asymptomatic. PO fluids poor d/t thickened liquids. BMP for AM. Encourage fludis -08/16/22 wt stable,  BP still soft but stable, BMP unremarkable; monitor -08/17/22 wt/BP stable, cont to monitor  - 4/15: Intermittent hypotension, encourage p.o. fluids.  Patient endorses he is asymptomatic, requests DC IV today. -08-18-16: Blood pressures improved with better p.o. intakes, monitor -08/23/22 BPs soft but relatively stable, monitor closely; wt overall stable Filed Weights   08/21/22 0500 08/22/22 0739 08/23/22 0500  Weight: 88.6 kg 89.7 kg 87.7 kg   Vitals:   08/19/22 1321 08/19/22 1935 08/20/22 0427 08/20/22 1403  BP: 104/70 103/63 105/78 102/75   08/20/22 1937 08/21/22 0514 08/21/22 1601 08/21/22 1950  BP: 138/64 (!) 103/53 (!) 107/54 108/63   08/22/22 0513 08/22/22 1442 08/22/22 1948 08/23/22 0534  BP: 97/66 (!) 98/58 (!) 89/45 90/61     13.  OSA. Intolerance to CPAP. Counseling on CPAP benefits.  14. Prediabetes. A1C 6.0. Monitor on CBC/CMP 15. Leukocytosis. Likely due to steroids. Stable. Monitor for s/s infection.    - 10.2->11->10.6 -> 12 4/3 ; monitor   - 4/8: 12.9 -> 11 4/11-.  9.3 4-15, normal  16. Macular degeneration: takes Preservision AREDS2 and Systane 0.6% eye drops at home -08/02/22 ordered hospital formulary alternatives- Lacrilube BID and Prosight multivitamin QD - 4/1-4/2: Wife concerned regarding use of alternative to systane; recommended bringing in from home  - 4/4: Pharmacy to assist in nonformulary order for home systane; ordered  17. R wrist bruising - stable, improving -08/02/22 wife concerned with bruises to R wrist,  appear older/healing, no tenderness or crepitus/deformities, no swelling. Suspect minor pressure related bruise, doubt underlying bony injury, doubt need for xray; likely related to blood thinners; monitor for now  - 4/2: Mild HgB drop 10.6->9.8-> 10.1 -> 9.9 post-op  18.  Hydrocephalus s/p AVM resection and pseudomeningocele repair.   - 4/3: Cognitive slowing, increased dysarthria and ataxia.  See above, CT head and labs today for workup.  No focal weakness or sensory changes, no gross cranial nerve deficits to indicate need for acute stroke workup.  Last known normal would have been at bedtime last night. - 4/4: CT head concerning for worsening hydrocephalus. See above.  - 4/5:  Dr. Alanson Puls and Dr. Jake Samples recommending VP shunt, family agreeable, plan to OR 4/5 and return to IPR if no complications -08/09/22 VP shunt placed last night, doing well, some mild abd discomfort but nondistended and no exquisite tenderness; pain control as above - 4.8: pending neurosurgery eval, per note may consider decreasing Strata II valve setting tomorrow to 1.0 or 0.5-adjusted to 1.0, with improvements - 4/10: CT head stable; Tx headache as above. Discussed with wife that imaging may take some time post-op to improve.  - 4/12: If no significant improvement by Monday, will ask for Dr. Conchita Paris to re-evaluate shunt settings (currently on 1.0) -4/15 -significant improvement in ataxia in right upper extremity and voice today.  Pseudomeningocele appears to be decreasing, continue to monitor. -4-16: Dr. Conchita Paris evaluated shunt overnight, no new recommendations. -4-18: Staples removed.    LOS: 23 days A FACE TO FACE EVALUATION WAS PERFORMED  [id number] 08/23/2022, 1:07 PM

## 2022-08-23 NOTE — Progress Notes (Signed)
  NEUROSURGERY PROGRESS NOTE   Pt seen this am, appears well. Reports small improvement in speech and more improvement in right-sided ataxia since our last visit a few days ago. Unfortunately cont to report HA/neck pain. Pseudomeningocele appears mostly unchanged from my last exam a few days ago. Still somewhat boggy but certainly not tense. Will d/w patient's wife this weekend, but suspect I will turn his shunt valve down to 0.5 (more drainage) on Monday.  Lisbeth Renshaw, MD [facility]

## 2022-08-23 NOTE — Progress Notes (Signed)
Occupational Therapy Session Note  Patient Details  Name: Christopher Yu MRN: [id number] Date of Birth: 12-24-1960  Today's Date: 08/23/2022 OT Individual Time: 4098-1191 OT Individual Time Calculation (min): 55 min    Short Term Goals: Week 3:  OT Short Term Goal 1 (Week 3): Patient will demonstrate increased snmoothness and accuracy by slowing movements down while bathing with a wash cloth OT Short Term Goal 1 - Progress (Week 3): Met OT Short Term Goal 2 (Week 3): Patient will maintain dynamic standing balance without a device when reaching outside base of support and min A OT Short Term Goal 2 - Progress (Week 3): Met OT Short Term Goal 3 (Week 3): Patient will don and doff toothpaste lid without dropping it on 2/3 trials OT Short Term Goal 3 - Progress (Week 3): Met Week 4:  OT Short Term Goal 1 (Week 4): LTG=STG 2.2 ELOS  Skilled Therapeutic Interventions/Progress Updates:    Pt bed level at time of session agreeable to OT, no pain reported, states he just had shower yesterday. Focused initially on morning ADL with pt ambulating bed > bathroom > sink all with RW and SBA, toilet transfer and 3/3 tasks in same manner. Pt able to don lace up tennis shoes with set up as well this date. Pt transported to therapy gym for time management and performing functional mobility wheelchair > mat SBA with RW with some ataxia noted but no LOB. Pt performing 1x10 sit <> stands no UE support while holding weighted ball, then 1x10 sit <> stand + overhead press with weighted ball for 1x10 as well. Dynamic standing with no UE support with CW/CCW around the worlds with basketball for 1x10 each direction. Discussion with pt as well throughout session as pt reports going home soon. Ambulated back to room SBA, alarm on call bell in reach.   Therapy Documentation Precautions:  Precautions Precautions: Fall Restrictions Weight Bearing Restrictions: No Other Position/Activity Restrictions: Ataxia,  impulsive, impaired problem-solving     Therapy/Group: Individual Therapy  Erasmo Score 08/23/2022, 7:48 AM

## 2022-08-23 NOTE — Progress Notes (Signed)
Physical Therapy Session Note  Patient Details  Name: Christopher Yu MRN: [id number] Date of Birth: Aug 07, 1960  Today's Date: 08/23/2022 PT Individual Time: 1405-1502 PT Individual Time Calculation (min): 57 min   Short Term Goals: Week 4:  PT Short Term Goal 1 (Week 4): STGs=LTGs secondary to ELOS  Skilled Therapeutic Interventions/Progress Updates:    Pt received supine in bed awake and agreeable to therapy session. Supine>sitting R EOB, HOB flat and not using bedrail, mod-I. Sitting EOB donned shoes set-up assist.   Pt reports he has not performed the oculomotor exercises because he "forgot" about them. Pt denies any instances of diplopia, dizziness, vertigo, or visual disturbance/change since last seen by this therapist.  Sit>stand EOB>RW with close supervision. Gait training ~144ft to main therapy gym using RW with CGA and pt demonstrating forward cervical flexion with downward gaze - upon questioning why he looks down pt states he cannot do multiple things at a time (look up, control RW, and step his feet correctly) with pt further stating he has to look down in order to step his feet correctly. With gentle education/encouragement pt agreeable to attempt looking up during gait and noticed to have worsening narrow BOS with minor scissoring.   Standing normal BOS: horizontal and vertical head turns with pt not having any significant LOB, but continues to have significant difficulty coordinating head/eye movements together with slight anterior/posterior sway with vertical head turns Progressed to narrow BOS with head turns, and pt having minor R anterior lean more prominent with horizontal head turns Pt noted to have significant difficulty coordinating his eye movements with his head movements, one or the other would move, or they just would not move together.  Gait training ~236ft, no UE support, with CGA/min assist for balance due to prominently R anterior LOB during gait with  no dual-task - upon questioning later pt reports he has a hard time knowing where to place his R LE in relation to his L LE causing the imbalance - pt states he has no proprioception in R LE - also pt continues to have downward gaze during gait.  Pt with poor frustration tolerance initially in session, for example if therapist changes the plan in the middle of the exercise he became irritated reporting he doesn't like that - therapist ensured to be more clear with the plan and upon building more rapport with pt, no additional irritated noted during session.  Dynamic gait training, no UE support, up/down hallway with R/L reciprocal visual scanning to locate numbered discs in order 2-10 and then identify color of them - pt with difficulty prioritizing balance over cognitive/visual task of finding the numbers requiring consistent min assist for balance.   Progressed to dynamic gait task, no UE support, up/down hallway to locate and pick-up those discs in order (while they were placed randomly in hallway) with extensive education to pt prior on the importance of prioritizing his balance over completion of the task and pt improving in his awareness of this - requires consistent min assist for balance and few instances of heavy min assist with pt primarily having anterior LOB especially when turning quickly or stopping forward movement.   Gait training ~72ft to/from stairs, no AD, with consistent min assist for balance as pt continues to have minor R anterior LOB with pt having excessive anterior pelvic tilt causing forward trunk flexion at hips.  Stair navigation training ascending/descending 4 steps (6" height) using R HR only with CGA and pt demonstrating good carryover of  education on step-to technique leading in each direction without cuing.  Gait training ~158ft back to his room using RW with CGA for steadying and continued cuing/education on looking up to progress towards more functional gait rather  than constantly requiring visual feedback to know where his LEs are placed. At end of session, pt does confess some uneasiness with having to trust where his R LE is going to be placed without looking at it.Therapist educated on healing process and need to challenge those systems to see if recovery is possible.  Sit>supine mod-I. Pt left supine in bed with needs in reach and bed alarm on.     Therapy Documentation Precautions:  Precautions Precautions: Fall Restrictions Weight Bearing Restrictions: No Other Position/Activity Restrictions: Ataxia, impulsive, impaired problem-solving   Pain:  Reports pain has "increased slowly back up to a 7/10" nurse notified for pain medication administration - seated rest breaks provided.    Therapy/Group: Individual Therapy  Ginny Forth , PT, DPT, NCS, CSRS 08/23/2022, 10:45 AM

## 2022-08-24 NOTE — Progress Notes (Cosign Needed)
Pt is cooperative, and appropriate. A&Ox4 Integumentary: no clubbing, cyanosis, or edema. Skin is clean,dry, and intact no breakdown. Healed right TKA incision. Cardiovascular: s1 and s2 sounds heard. RRR, no murmurs, rubs, or gallops Respiratory: CTA bilaterally w/o wheezes, rales, or rhonchi.  Gastrointestinal: bowel sounds present in all four quardants, no distension. Abd is soft. Neurological: occipital swelling. Pain level at a 6 on 0-10 scale.

## 2022-08-24 NOTE — Progress Notes (Signed)
Pt is cooperative, and appropriate. A&Ox4 Integumentary: no clubbing, cyanosis, or edema. Skin is clean,dry, and intact no breakdown. Healed scar from  right TKA incision. Cardiovascular: s1 and s2 sounds heard. RRR, no murmurs, rubs, or gallops Respiratory: CTA bilaterally w/o wheezes, rales, or rhonchi.  Gastrointestinal: bowel sounds present in all four quardants, no distension. Abd is soft. Neurological: occipital swelling. Pain level at a 6 on 0-10 scale.  I agree with this student's documentation.

## 2022-08-24 NOTE — Progress Notes (Signed)
PROGRESS NOTE   Subjective/Complaints:  Pt feeling well again today, slept well last night, pain improving, LBM yesterday, urinating well, denies any other complaints/concerns at this time. Nothing really to report today, about the same as yesterday.   ROS:  + Adjustment difficulty with depressed mood - improving +dysarthria c/b wet vocal quality-improved +HAs/postop head pain-ongoing, improving +insomnia-resolved with medication retiming Denies fevers, chills, abd pain, N/V, constipation, diarrhea, SOB, cough, chest pain, new weakness or paraesthesias.    Objective:   No results found. No results for input(s): "WBC", "HGB", "HCT", "PLT" in the last 72 hours.   No results for input(s): "NA", "K", "CL", "CO2", "GLUCOSE", "BUN", "CREATININE", "CALCIUM" in the last 72 hours.   Intake/Output Summary (Last 24 hours) at 08/24/2022 1152 Last data filed at 08/24/2022 0827 Gross per 24 hour  Intake 473 ml  Output 500 ml  Net -27 ml        Physical Exam: Vital Signs Blood pressure 106/63, pulse 63, temperature 98.4 F (36.9 C), resp. rate 18, height  (1.854 m), weight 88.1 kg, SpO2 98 %.    General: alert, laying in bed, sleeping but arousable, no acute distress  HEENT:  R scalp  - shunt in place, staples s/p removal.  PERRLA.   4/17: Right beating nystagmus, most apparent on leftward gaze. Some difficulty with convergence - stable--not reassessed today 08/24/22  Neck: Supple without JVD Heart: Reg rate and rhythm. No murmurs rubs or gallops Chest: CTA bilaterally without wheezes, rales, or rhonchi; no distress.  Abdomen: Soft, nonTTP, non-distended, bowel sounds positive. Multiple laparoscopic incisions with glue, c/d/i Extremities: No clubbing, cyanosis, or edema.   Psych: Pt's affect is appropriate . Pt is cooperative.   Neuro: speech apraxia and dysarthria slowly improving from prior exams  PRIOR EXAMS: Skin:  Clean and intact without signs of breakdown,  healed incision/scar to posterior neck, with fluctuant fluid-stable  + Staples removed, shunt site c/d/I.  Minimal bruising along shunt trajectory through the side of his neck. + Abdominal trochar sites well approximated with dermabond, c/d/I.  No tenderness to palpation Neuro: Alert and oriented x 4. + Right facial droop -ongoing  + Speech apraxia and dysarthria -slowly improving  strength antigravity and against resistance in bilateral upper extremities and lower extremities Sensation intact light touch in all 4 extremities + Tender to palpation around the fluctuant fluid side and throughout the neck, bilateral temples, and forehead. -improved tolerance for palpation + FTN  altered  improving daily-mild bilaterally, much improved Musculoskeletal: Normal bulk and tone, no joint swelling noted.  Moving all 4 limbs antigravity and against resistance.    Assessment/Plan: 1. Functional deficits which require 3+ hours per day of interdisciplinary therapy in a comprehensive inpatient rehab setting. Physiatrist is providing close team supervision and 24 hour management of active medical problems listed below. Physiatrist and rehab team continue to assess barriers to discharge/monitor patient progress toward functional and medical goals  Care Tool:  Bathing    Body parts bathed by patient: Right arm, Left arm, Chest, Front perineal area, Abdomen, Buttocks, Right upper leg, Left upper leg, Right lower leg, Left lower leg, Face  Bathing assist Assist Level: Minimal Assistance - Patient > 75%     Upper Body Dressing/Undressing Upper body dressing   What is the patient wearing?: Pull over shirt    Upper body assist Assist Level: Supervision/Verbal cueing    Lower Body Dressing/Undressing Lower body dressing      What is the patient wearing?: Pants, Underwear/pull up     Lower body assist Assist for lower body dressing: Minimal  Assistance - Patient > 75%     Toileting Toileting    Toileting assist Assist for toileting: Contact Guard/Touching assist     Transfers Chair/bed transfer  Transfers assist     Chair/bed transfer assist level: Contact Guard/Touching assist     Locomotion Ambulation   Ambulation assist      Assist level: Minimal Assistance - Patient > 75% Assistive device: Walker-rolling Max distance: 150   Walk 10 feet activity   Assist     Assist level: Minimal Assistance - Patient > 75% Assistive device: Walker-rolling   Walk 50 feet activity   Assist    Assist level: Minimal Assistance - Patient > 75% Assistive device: Walker-rolling    Walk 150 feet activity   Assist    Assist level: Minimal Assistance - Patient > 75% Assistive device: Walker-rolling    Walk 10 feet on uneven surface  activity   Assist     Assist level: Minimal Assistance - Patient > 75% Assistive device: Development worker, international aid     Assist Is the patient using a wheelchair?: Yes Type of Wheelchair: Manual    Wheelchair assist level: Minimal Assistance - Patient > 75% Max wheelchair distance: 150'    Wheelchair 50 feet with 2 turns activity    Assist        Assist Level: Minimal Assistance - Patient > 75%   Wheelchair 150 feet activity     Assist      Assist Level: Minimal Assistance - Patient > 75%   Blood pressure 106/63, pulse 63, temperature 98.4 F (36.9 C), resp. rate 18, height 6\' 1"  (1.854 m), weight 88.1 kg, SpO2 98 %.   Medical Problem List and Plan: 1. Functional deficits secondary to cerebellar CVA, c/b recent pseudomeningocele repair and Hx AVM resection December 2023..     - 4/4: Note Dr. Alanson Puls stating patient likely did, in fact, have a cerebellar stroke and not just post-operative changes as previously noted. Changing primary Dx to cerebellar infarct.           -patient may shower             -ELOS/Goals: 7-10 days, Mod I  PT/OT/SLP; DC goal extended to 4/23             -Continue CIR -Decadron as indicated 4 MG Q8 HOURS with TAPER STARTING 3/29 to 4/10 - 4/3: Worsening hydrocephalus on CT ->VP shunt placment 4/5 -> adjusted 4/8 to 1.0 with some subjective improvement in ataxia on exam  - 4/10: CT head for worsening headaches, stable.  Treatment as below. - 4-16: Vestibular evaluation by PT-per their assessment, difficulty with smooth pursuit and saccades, more prominent in R eye; complicated by poor insight/awareness of when his eyes are not actually on the target (if he is seeing it slightly from his peripheral vision)   2.  Antithrombotics: -DVT/anticoagulation:  Pharmaceutical: Lovenox 40mg  QD -antiplatelet therapy: Aspirin 81 mg daily- currently held 08/09/22 d/t surgery, spoke with Dr. Conchita Paris 08/09/22, states it should restart in 1-2 days - > resume 4/9  3. Pain Management: Tylenol as needed, Diazepam 5mg  q8h PRN muscle spasms, Voltaren 1% 2g QID - 3/29: Schedule Tylenol 1000 mg TID, add gabapentin 300 mg QHS for headache and sleep -08/02/22 pt previously on Hydrocodone-APAP 5-325mg  at prior discharge, was still using this, would like to keep; reordered for now, 5-325mg  q6h PRN x7 days, if needed longer then weekday team to discuss; adjusted tylenol to 650mg  TID to avoid overdosing of tylenol; would also prefer gabapentin be scheduled BID rather than PRN -08/03/22 - 4/1 improved control, cont regimen for now -08/09/22 Hydrocodone 5-325mg  q6h PRN readded since he had surgery again yesterday; also added morphine 4mg  q4h PRN in case he needs IV pain control postoperatively; monitor closely -08/11/22 pain ongoing, monitor and convert to PO regimen in AM. For HA, Increase gabapentin from 300 mg BID to 300/300/600 -Sleep and headache improved with gabapentin, monitor -4/10: Headache worse, diffuse this a.m.  CT head stable.  Patient did not tolerate discontinuation of as needed IV morphine.  Scheduled Norco every 6, added  Topamax 25 mg twice daily, reduce morphine to 2 mg IV twice daily as needed. -4/10: Maintain Topamax 25 mg twice daily, Norco scheduled.  Increase gabapentin to 600 mg 3 times daily, increase as needed morphine to 3 mg IV twice daily.  Goal to wean off IV off by the end of the week. -4/12: Increase topomax to 50 mg BID. Weaning morphine PRN over the weekend to 2 mg BID PRN 4/13, 1 mg BID PRN 4/14, then DC.   -4/13-14/24 pain managed for now, monitor with weaning morphine - 4/15: Weaned off of morphine starting today, continue scheduled pain regimen with plan to wean down later this week. ?  Association with pressure on pseudomeningocele in the back of the head.  May benefit from a cervical collar, however this could also increase the pressure make it worse.  Will look at repositioning options today. -4/16: Switch gabapentin 600 mg 3 times daily to Lyrica 200 mg twice daily.  Read time 4 times daily Norco 5 mg to allow sleep from 2200 to 0600.  Added on at 2 additional PRNs of oxycodone 5 mg for pain control if needed with longer intervals during sleep.  Discontinue Topamax due to no benefit and possible effects on mood. -4-17: Improved, continue current regimen.  Using both PRNs.  Worsens with activity. -4/18: Spoke with nursing regarding nighttime interruptions for scheduled medications. DC staples today.  -4-19: Slept well overnight, no interruptions, using as needed so we will maintain current regimen over the weekend and further wean Monday.  -4/20-21/24 slept well and denied pain this weekend to me  4. Mood/Behavior/Sleep: Zoloft 50 mg daily>> inc to 75mg  QD             -antipsychotic agents: N/A   -3/29: Gabapentin as above, melatonin 5mg  QHS PRN  -08/02/22 scheduled Gabapentin BID and melatonin QHS per request  -08/03/22 greatly improved sleep - continue  -4/2: Added trazodone 50 mg PRN for ?insomnia; not endorsed by patient.  -4/3: Patient endorses ongoing insomnia, uncertain etiology.  As  needed trazodone not utilized, will schedule starting tonight. - sleep stable -08/09/22 slept poorly but suspect could be from missing some meds yesterday and postop recovery; monitor for now -08/10/22 sleep gradually improving, monitor - 4/8: remains poor d.t HA, pain; tx as above - 4/9: Endorses worsening depression, more labile mood secondary to frustration regarding his diagnoses.  Agreeable to increasing antidepressant sertraline from 50 to 75 mg.  Could also  consider switching to BuSpar given some studies showing benefit for cerebellar ataxia. - 4/16: Patient endorses significant difficulty with adjustment and depression regarding perceived failure to progress, feeling like things will not get better.  Did encourage him regarding his daily functional games, and talked about stroke recovery extending up to a year.  No SI/HI.  Sertraline increased as above, will take a while to appreciate effects. -4/20-21/24 sleep improving, cont regimen  5. Neuropsych/cognition: This patient is capable of making decisions on his own behalf. 6. Skin/Wound Care: Routine skin checks 7. Dysphagia/Fluids/Electrolytes/Nutrition: Routine in and outs with follow-up chemistries  - 3/29: Cr stable, BUN increased, encourage PO fluids and re-test Monday--ordered weekly; stable CR, BUN improved  4/1; stable 4/3; stable 4/8 -08/02/22 takes Vitamin D 2000IU QD and Calcium supplement at home; reordered -08/10/22 pt dislikes diet order/purees and refuses to eat it, would prefer to go back to prior diet (bacon, eggs, mashed potatoes)-- not on SLP schedule today but spoke with speech therapy and asked if they could possibly do a quick eval to see if he can advance his diet today-- they will try to get to him today - 4.8: diet advanced to Dys 3; pt reports improved POS -4/10: Nutrition evaluated, meeting nutritional needs.  Supplements as needed.  Monitor -4/12: wet vocal quality and feel secretions interfering with POs; add loratidine 1  tab daily for drying -mildly improved, may remove later this week 4/15: Creatinine uptrending, although BUN remains stable and other electrolytes look good.  Encourage p.o. fluids.  Retest Thursday.  - 4/16: Will discuss possibility of utilizing provale cup to help with p.o. intakes, as patient remains under supervision of fluid intakes and this is limiting his ability to drink.  Did have mildly improved p.o.'s today of 580 cc. -4/17: Eating 40 to 50% of meals, p.o.'s remain about 400 to 500 cc.  Unable to use Provale  Supervision only.  Discontinue loratadine, vocal quality improved. -4/18: Advanced to free water protocol; patient refuses Provale  8.  Hyperlipidemia.  Crestor  daily 9.  Rheumatoid arthritis.  Consider resuming home regimen of rituximab, methotrexate and sulfasalazine after steroids completed (4/10).  Currently on Dexamethasone with taper. -08/02/22 wife concerned with restarting of meds; advised to readdress this once steroids have tapered around 08/13/22 -4-10: Wife requesting resumption of rituximab, methotrexate, sulfasalazine.  Theoretical risk of increased post-operative infection, however on lit review appears minimal. Will get CBC, CMP in AM to ensure WBC count decreases in setting of completed steroid taper, then resume. - 4-11: No signs or symptoms of infection, white count downtrending after steroid treatment completed.  Will touch base with rheum (Dr. Corliss Skains) about restarting regimen; no direct contrainidications identified.  -4/12: Per rheumatology, can restart all immunologics 2 weeks post-op (4/19) -ordered to start 4/20 AM with sulfasalazine 1000 mg twice daily, methotrexate 20 mg q. weekly; defer resumption of rituximab to outpatient rheumatologist.  Family in agreement.   10.  CAD.  Continue low-dose aspirin. Denies CP 11.  Dysphagia. Regular diet with nectar liquids. Follow up SLP 12.  Chronic systolic congestive heart failure.  Monitor for any signs of fluid  overload. Daily weight. Currently HOLDING Spironolactone 12.5mg  QD (halved during last hospital stay), Lisinopril  QD, Metoprolol  QD  -3/30-4/5 wt stable, BP stable, continue to monitor -08/09/22 wt stable, BPs soft overnight but somewhat close to prior, improved this morning; monitor closely tonight, may need gentle hydration, hold off on labs for now -4/7-11/24 BPs stable monitor -4/11: Weights downtrending. BP  stable. Monitor.  -4/12: mild hypotension today, asymptomatic. PO fluids poor d/t thickened liquids. BMP for AM. Encourage fludis -08/16/22 wt stable, BP still soft but stable, BMP unremarkable; monitor -08/17/22 wt/BP stable, cont to monitor  - 4/15: Intermittent hypotension, encourage p.o. fluids.  Patient endorses he is asymptomatic, requests DC IV today. -08-18-16: Blood pressures improved with better p.o. intakes, monitor -4/20-21/24 BPs soft but relatively stable, monitor closely; wt overall stable Filed Weights   08/22/22 0739 08/23/22 0500 08/24/22 0600  Weight: 89.7 kg 87.7 kg 88.1 kg   Vitals:   08/20/22 1403 08/20/22 1937 08/21/22 0514 08/21/22 1601  BP: 102/75 138/64 (!) 103/53 (!) 107/54   08/21/22 1950 08/22/22 0513 08/22/22 1442 08/22/22 1948  BP: 108/63 97/66 (!) 98/58 (!) 89/45   08/23/22 0534 08/23/22 1356 08/23/22 2030 08/24/22 0600  BP: 90/61 99/61 96/63  106/63     13.  OSA. Intolerance to CPAP. Counseling on CPAP benefits.  14. Prediabetes. A1C 6.0. Monitor on CBC/CMP 15. Leukocytosis. Likely due to steroids. Stable. Monitor for s/s infection.    - 10.2->11->10.6 -> 12 4/3 ; monitor   - 4/8: 12.9 -> 11 4/11-.  9.3 4-15, normal  16. Macular degeneration: takes Preservision AREDS2 and Systane 0.6% eye drops at home -08/02/22 ordered hospital formulary alternatives- Lacrilube BID and Prosight multivitamin QD - 4/1-4/2: Wife concerned regarding use of alternative to systane; recommended bringing in from home  - 4/4: Pharmacy to assist in nonformulary  order for home systane; ordered  17. R wrist bruising - stable, improving -08/02/22 wife concerned with bruises to R wrist, appear older/healing, no tenderness or crepitus/deformities, no swelling. Suspect minor pressure related bruise, doubt underlying bony injury, doubt need for xray; likely related to blood thinners; monitor for now  - 4/2: Mild HgB drop 10.6->9.8-> 10.1 -> 9.9 post-op  18.  Hydrocephalus s/p AVM resection and pseudomeningocele repair.   - 4/3: Cognitive slowing, increased dysarthria and ataxia.  See above, CT head and labs today for workup.  No focal weakness or sensory changes, no gross cranial nerve deficits to indicate need for acute stroke workup.  Last known normal would have been at bedtime last night. - 4/4: CT head concerning for worsening hydrocephalus. See above.  - 4/5:  Dr. Alanson Puls and Dr. Jake Samples recommending VP shunt, family agreeable, plan to OR 4/5 and return to [facility] if no complications -08/09/22 VP shunt placed last night, doing well, some mild abd discomfort but nondistended and no exquisite tenderness; pain control as above - 4.8: pending neurosurgery eval, per note may consider decreasing Strata II valve setting tomorrow to 1.0 or 0.5-adjusted to 1.0, with improvements - 4/10: CT head stable; Tx headache as above. Discussed with wife that imaging may take some time post-op to improve.  - 4/12: If no significant improvement by Monday, will ask for Dr. Conchita Paris to re-evaluate shunt settings (currently on 1.0) -4/15 -significant improvement in ataxia in right upper extremity and voice today.  Pseudomeningocele appears to be decreasing, continue to monitor. -4-16: Dr. Conchita Paris evaluated shunt overnight, no new recommendations. -4-18: Staples removed.    LOS: 24 days A FACE TO FACE EVALUATION WAS PERFORMED  [address] Nene Aranas 08/24/2022, 11:52 AM

## 2022-08-25 LAB — BASIC METABOLIC PANEL
Anion gap: 7 (ref 5–15)
BUN: 15 mg/dL (ref 8–23)
CO2: 28 mmol/L (ref 22–32)
Calcium: 8.7 mg/dL — ABNORMAL LOW (ref 8.9–10.3)
Chloride: 106 mmol/L (ref 98–111)
Creatinine, Ser: 0.83 mg/dL (ref 0.61–1.24)
GFR, Estimated: >60 mL/min (ref >60)
Glucose, Bld: 104 mg/dL — ABNORMAL HIGH (ref 70–99)
Potassium: 3.7 mmol/L (ref 3.5–5.1)
Sodium: 141 mmol/L (ref 135–145)

## 2022-08-25 LAB — CBC
HCT: 29.3 % — ABNORMAL LOW (ref 39.0–52.0)
Hemoglobin: 9.3 g/dL — ABNORMAL LOW (ref 13.0–17.0)
MCH: 23.7 pg — ABNORMAL LOW (ref 26.0–34.0)
MCHC: 31.7 g/dL (ref 30.0–36.0)
MCV: 74.6 fL — ABNORMAL LOW (ref 80.0–100.0)
Platelets: 143 10*3/uL — ABNORMAL LOW (ref 150–400)
RBC: 3.93 MIL/uL — ABNORMAL LOW (ref 4.22–5.81)
RDW: 21 % — ABNORMAL HIGH (ref 11.5–15.5)
WBC: 6.9 10*3/uL (ref 4.0–10.5)
nRBC: 0 % (ref 0.0–0.2)

## 2022-08-25 MED ORDER — ASPIRIN 81 MG PO TBEC: 81.0000 mg | DELAYED_RELEASE_TABLET | Freq: Every day | ORAL | 12 refills | Status: AC

## 2022-08-25 MED ORDER — SERTRALINE HCL 25 MG PO TABS: 75.0000 mg | ORAL_TABLET | Freq: Every morning | ORAL | 0 refills | Status: DC

## 2022-08-25 MED ORDER — OXYCODONE HCL 5 MG PO TABS: 5.0000 mg | ORAL_TABLET | Freq: Two times a day (BID) | ORAL | 0 refills | Status: DC | PRN

## 2022-08-25 MED ORDER — PREGABALIN 200 MG PO CAPS: 200.0000 mg | ORAL_CAPSULE | Freq: Two times a day (BID) | ORAL | 0 refills | Status: DC

## 2022-08-25 MED ORDER — ALDACTONE 25 MG PO TABS: 12.5000 mg | ORAL_TABLET | Freq: Every day | ORAL | 11 refills | Status: DC

## 2022-08-25 MED ORDER — SPIRONOLACTONE 25 MG PO TABS: 25.0000 mg | ORAL_TABLET | Freq: Every day | ORAL | 2 refills | Status: DC

## 2022-08-25 NOTE — Plan of Care (Signed)
  Problem: RH Balance Goal: LTG Patient will maintain dynamic standing with ADLs (OT) Description: LTG:  Patient will maintain dynamic standing balance with assist during activities of daily living (OT)  Outcome: Completed/Met   Problem: Sit to Stand Goal: LTG:  Patient will perform sit to stand in prep for activites of daily living with assistance level (OT) Description: LTG:  Patient will perform sit to stand in prep for activites of daily living with assistance level (OT) Outcome: Completed/Met   Problem: RH Grooming Goal: LTG Patient will perform grooming w/assist,cues/equip (OT) Description: LTG: Patient will perform grooming with assist, with/without cues using equipment (OT) Outcome: Completed/Met   Problem: RH Bathing Goal: LTG Patient will bathe all body parts with assist levels (OT) Description: LTG: Patient will bathe all body parts with assist levels (OT) Outcome: Completed/Met   Problem: RH Dressing Goal: LTG Patient will perform upper body dressing (OT) Description: LTG Patient will perform upper body dressing with assist, with/without cues (OT). Outcome: Completed/Met Goal: LTG Patient will perform lower body dressing w/assist (OT) Description: LTG: Patient will perform lower body dressing with assist, with/without cues in positioning using equipment (OT) Outcome: Completed/Met   Problem: RH Toileting Goal: LTG Patient will perform toileting task (3/3 steps) with assistance level (OT) Description: LTG: Patient will perform toileting task (3/3 steps) with assistance level (OT)  Outcome: Completed/Met   Problem: RH Functional Use of Upper Extremity Goal: LTG Patient will use RT/LT upper extremity as a (OT) Description: LTG: Patient will use right/left upper extremity as a stabilizer/gross assist/diminished/nondominant/dominant level with assist, with/without cues during functional activity (OT) Outcome: Completed/Met   Problem: RH Toilet Transfers Goal: LTG Patient  will perform toilet transfers w/assist (OT) Description: LTG: Patient will perform toilet transfers with assist, with/without cues using equipment (OT) Outcome: Completed/Met   Problem: RH Tub/Shower Transfers Goal: LTG Patient will perform tub/shower transfers w/assist (OT) Description: LTG: Patient will perform tub/shower transfers with assist, with/without cues using equipment (OT) Outcome: Completed/Met   

## 2022-08-25 NOTE — Progress Notes (Signed)
  NEUROSURGERY PROGRESS NOTE   Pt appears to be doing reasonably well. Ataxic speech is somewhat improved. Right-side is also somewhat improving. I interrogated his valve today and is set at 1.0 from previous. I turned it down today to 0.5 today.   Lisbeth Renshaw, MD [facility]

## 2022-08-25 NOTE — Plan of Care (Signed)
  Problem: RH Swallowing Goal: LTG Patient will consume least restrictive diet using compensatory strategies with assistance (SLP) Description: LTG:  Patient will consume least restrictive diet using compensatory strategies with assistance (SLP) Outcome: Completed/Met Flowsheets (Taken 08/25/2022 1203) LTG: Pt Patient will consume least restrictive diet using compensatory strategies with assistance of (SLP): Supervision Goal: LTG Patient will participate in dysphagia therapy to increase swallow function with assistance (SLP) Description: LTG:  Patient will participate in dysphagia therapy to increase swallow function with assistance (SLP) Outcome: Completed/Met Flowsheets (Taken 08/01/2022 1646 by Huston Foley, CCC-SLP) LTG: Pt will participate in dysphagia therapy to increase swallow function with assistance of (SLP): Supervision Goal: LTG Pt will demonstrate functional change in swallow as evidenced by bedside/clinical objective assessment (SLP) Description: LTG: Patient will demonstrate functional change in swallow as evidenced by bedside/clinical objective assessment (SLP) Outcome: Completed/Met Flowsheets (Taken 08/25/2022 1203) LTG: Patient will demonstrate functional change in swallow as evidenced by bedside/clinical objective assessment: Oropharyngeal swallow   Problem: RH Expression Communication Goal: LTG Patient will increase speech intelligibility (SLP) Description: LTG: Patient will increase speech intelligibility at word/phrase/conversation level with cues, % of the time (SLP) Outcome: Completed/Met Flowsheets (Taken 08/25/2022 1203) LTG: Patient will increase speech intelligibility (SLP): Minimal Assistance - Patient > 75% Level: Conversation level   Problem: RH Attention Goal: LTG Patient will demonstrate this level of attention during functional activites (SLP) Description: LTG:  Patient will will demonstrate this level of attention during functional activites  (SLP) Outcome: Completed/Met Flowsheets Taken 08/25/2022 1203 by Tacey Ruiz, CCC-SLP Patient will demonstrate during cognitive/linguistic activities the attention type of: Selective Taken 08/01/2022 1646 by Huston Foley, CCC-SLP LTG: Patient will demonstrate this level of attention during cognitive/linguistic activities with assistance of (SLP): Supervision   Problem: RH Awareness Goal: LTG: Patient will demonstrate awareness during functional activites type of (SLP) Description: LTG: Patient will demonstrate awareness during functional activites type of (SLP) Outcome: Completed/Met Flowsheets (Taken 08/25/2022 1203) Patient will demonstrate during cognitive/linguistic activities awareness type of: Emergent LTG: Patient will demonstrate awareness during cognitive/linguistic activities with assistance of (SLP): Supervision

## 2022-08-25 NOTE — Progress Notes (Signed)
PROGRESS NOTE   Subjective/Complaints:  Pt endorsing pain in his right shoulder today.  States this is chronic.  Generally prescribed a steroid taper by his rheumatologist when he gets pain like this.  Headaches remain persistent, however exam today is improved from priors.  He states Dr. Conchita Paris is coming by today to adjust his shunt.  Continues to sleep poorly, states that he gets frequent interruptions with bells, noises outside of the room which wake him up and then he is unable to go back to sleep.  ROS:  + Adjustment difficulty with depressed mood - improving +dysarthria c/b wet vocal quality-improved +HAs/postop head pain-ongoing, improving +insomnia-ongoing Denies fevers, chills, abd pain, N/V, constipation, diarrhea, SOB, cough, chest pain, new weakness or paraesthesias.    Objective:   No results found. Recent Labs    08/25/22 0601  WBC 6.9  HGB 9.3*  HCT 29.3*  PLT 143*     Recent Labs    08/25/22 0601  NA 141  K 3.7  CL 106  CO2 28  GLUCOSE 104*  BUN 15  CREATININE 0.83  CALCIUM 8.7*     Intake/Output Summary (Last 24 hours) at 08/25/2022 0855 Last data filed at 08/25/2022 0600 Gross per 24 hour  Intake 358 ml  Output 650 ml  Net -292 ml         Physical Exam: Vital Signs Blood pressure 106/61, pulse 64, temperature 98 F (36.7 C), temperature source Oral, resp. rate 16, height  (1.854 m), weight 88.1 kg, SpO2 97 %.    General: alert, laying in bed, sitting upright and awake, no acute distress  HEENT:  R scalp  - shunt in place, staples s/p removal.  PERRLA.   4/17: Right beating nystagmus, most apparent on leftward gaze. Some difficulty with convergence - stable--not reassessed today 08/24/22  Neck: Supple without JVD Heart: Reg rate and rhythm. No murmurs rubs or gallops Chest: CTA bilaterally without wheezes, rales, or rhonchi; no distress.  Abdomen: Soft, nonTTP,  non-distended, bowel sounds positive. Multiple laparoscopic incisions with glue, c/d/i Extremities: No clubbing, cyanosis, or edema.   Psych: Pt's affect is appropriate . Pt is cooperative.   Neuro: speech apraxia and dysarthria slowly improving from prior exams  PRIOR EXAMS: Skin: Clean and intact without signs of breakdown,  healed incision/scar to posterior neck, with fluctuant fluid-mildly increased from prior exam, tender + Staples removed, shunt site c/d/I.  + Abdominal trochar sites well approximated with dermabond, c/d/I.    Neuro: Alert and oriented x 4. + Right facial droop -ongoing  + Speech apraxia and dysarthria -more apparent this a.m.  strength antigravity and against resistance in bilateral upper extremities and lower extremities Sensation intact light touch in all 4 extremities + Tender to palpation around the fluctuant fluid site; bilateral temples, and forehead, no longer tender or exacerbating headaches with palpation or with active range of motion of facial muscles  + FTN  altered  improving daily-mild bilaterally, much improved  Musculoskeletal: Normal bulk and tone, no joint swelling noted.  Moving all 4 limbs antigravity and against resistance.  Limited antigravity range of motion with right shoulder due to diffuse arthritic pain.  Assessment/Plan: 1. Functional deficits which require 3+ hours per day of interdisciplinary therapy in a comprehensive inpatient rehab setting. Physiatrist is providing close team supervision and 24 hour management of active medical problems listed below. Physiatrist and rehab team continue to assess barriers to discharge/monitor patient progress toward functional and medical goals  Care Tool:  Bathing    Body parts bathed by patient: Right arm, Left arm, Chest, Front perineal area, Abdomen, Buttocks, Right upper leg, Left upper leg, Right lower leg, Left lower leg, Face         Bathing assist Assist Level: Minimal Assistance  - Patient > 75%     Upper Body Dressing/Undressing Upper body dressing   What is the patient wearing?: Pull over shirt    Upper body assist Assist Level: Supervision/Verbal cueing    Lower Body Dressing/Undressing Lower body dressing      What is the patient wearing?: Pants, Underwear/pull up     Lower body assist Assist for lower body dressing: Minimal Assistance - Patient > 75%     Toileting Toileting    Toileting assist Assist for toileting: Contact Guard/Touching assist     Transfers Chair/bed transfer  Transfers assist     Chair/bed transfer assist level: Contact Guard/Touching assist     Locomotion Ambulation   Ambulation assist      Assist level: Minimal Assistance - Patient > 75% Assistive device: Walker-rolling Max distance: 150   Walk 10 feet activity   Assist     Assist level: Minimal Assistance - Patient > 75% Assistive device: Walker-rolling   Walk 50 feet activity   Assist    Assist level: Minimal Assistance - Patient > 75% Assistive device: Walker-rolling    Walk 150 feet activity   Assist    Assist level: Minimal Assistance - Patient > 75% Assistive device: Walker-rolling    Walk 10 feet on uneven surface  activity   Assist     Assist level: Minimal Assistance - Patient > 75% Assistive device: Development worker, international aid     Assist Is the patient using a wheelchair?: Yes Type of Wheelchair: Manual    Wheelchair assist level: Minimal Assistance - Patient > 75% Max wheelchair distance: 150'    Wheelchair 50 feet with 2 turns activity    Assist        Assist Level: Minimal Assistance - Patient > 75%   Wheelchair 150 feet activity     Assist      Assist Level: Minimal Assistance - Patient > 75%   Blood pressure 106/61, pulse 64, temperature 98 F (36.7 C), temperature source Oral, resp. rate 16, height 6\' 1"  (1.854 m), weight 88.1 kg, SpO2 97 %.   Medical Problem List and Plan: 1.  Functional deficits secondary to cerebellar CVA, c/b recent pseudomeningocele repair and Hx AVM resection December 2023..     - 4/4: Note Dr. Alanson Puls stating patient likely did, in fact, have a cerebellar stroke and not just post-operative changes as previously noted. Changing primary Dx to cerebellar infarct.           -patient may shower             -ELOS/Goals: 7-10 days, Mod I PT/OT/SLP; DC goal extended to 4/23             -Continue CIR -Decadron as indicated 4 MG Q8 HOURS with TAPER STARTING 3/29 to 4/10 - 4/3: Worsening hydrocephalus on CT ->VP shunt placment 4/5 -> adjusted 4/8 to 1.0 with some subjective  improvement in ataxia on exam  - 4/10: CT head for worsening headaches, stable.  Treatment as below. - 4-16: Vestibular evaluation by PT-per their assessment, difficulty with smooth pursuit and saccades, more prominent in R eye; complicated by poor insight/awareness of when his eyes are not actually on the target (if he is seeing it slightly from his peripheral vision)  4-22: Dr. Conchita Paris to evaluate shunt today  2.  Antithrombotics: -DVT/anticoagulation:  Pharmaceutical: Lovenox 40mg  QD -antiplatelet therapy: Aspirin 81 mg daily- currently held 08/09/22 d/t surgery, spoke with Dr. Conchita Paris 08/09/22, states it should restart in 1-2 days - > resume 4/9  3. Pain Management: Tylenol as needed, Diazepam 5mg  q8h PRN muscle spasms, Voltaren 1% 2g QID - 3/29: Schedule Tylenol 1000 mg TID, add gabapentin 300 mg QHS for headache and sleep -08/02/22 pt previously on Hydrocodone-APAP 5-325mg  at prior discharge, was still using this, would like to keep; reordered for now, 5-325mg  q6h PRN x7 days, if needed longer then weekday team to discuss; adjusted tylenol to 650mg  TID to avoid overdosing of tylenol; would also prefer gabapentin be scheduled BID rather than PRN -08/03/22 - 4/1 improved control, cont regimen for now -08/09/22 Hydrocodone 5-325mg  q6h PRN readded since he had surgery again yesterday;  also added morphine 4mg  q4h PRN in case he needs IV pain control postoperatively; monitor closely -08/11/22 pain ongoing, monitor and convert to PO regimen in AM. For HA, Increase gabapentin from 300 mg BID to 300/300/600 -Sleep and headache improved with gabapentin, monitor -4/10: Headache worse, diffuse this a.m.  CT head stable.  Patient did not tolerate discontinuation of as needed IV morphine.  Scheduled Norco every 6, added Topamax 25 mg twice daily, reduce morphine to 2 mg IV twice daily as needed. -4/10: Maintain Topamax 25 mg twice daily, Norco scheduled.  Increase gabapentin to 600 mg 3 times daily, increase as needed morphine to 3 mg IV twice daily.  Goal to wean off IV off by the end of the week. -4/12: Increase topomax to 50 mg BID. Weaning morphine PRN over the weekend to 2 mg BID PRN 4/13, 1 mg BID PRN 4/14, then DC.   -4/13-14/24 pain managed for now, monitor with weaning morphine - 4/15: Weaned off of morphine starting today, continue scheduled pain regimen with plan to wean down later this week. ?  Association with pressure on pseudomeningocele in the back of the head.  May benefit from a cervical collar, however this could also increase the pressure make it worse.  Will look at repositioning options today. -4/16: Switch gabapentin 600 mg 3 times daily to Lyrica 200 mg twice daily.  Read time 4 times daily Norco 5 mg to allow sleep from 2200 to 0600.  Added on at 2 additional PRNs of oxycodone 5 mg for pain control if needed with longer intervals during sleep.  Discontinue Topamax due to no benefit and possible effects on mood. -4-17: Improved, continue current regimen.  Using both PRNs.  Worsens with activity. 4-22: Requesting steroid taper due to chronic right shoulder pain.  Will look into any needed RA medication adjustments with this, if needed may initiate prior to discharge.  4. Mood/Behavior/Sleep: Zoloft 50 mg daily>> inc to 75mg  QD             -antipsychotic agents: N/A    -3/29: Gabapentin as above, melatonin 5mg  QHS PRN  -08/02/22 scheduled Gabapentin BID and melatonin QHS per request  -08/03/22 greatly improved sleep - continue  -4/2: Added trazodone 50 mg PRN  for ?insomnia; not endorsed by patient.  -4/3: Patient endorses ongoing insomnia, uncertain etiology.  As needed trazodone not utilized, will schedule starting tonight. - sleep stable -08/09/22 slept poorly but suspect could be from missing some meds yesterday and postop recovery; monitor for now -08/10/22 sleep gradually improving, monitor - 4/8: remains poor d.t HA, pain; tx as above - 4/9: Endorses worsening depression, more labile mood secondary to frustration regarding his diagnoses.  Agreeable to increasing antidepressant sertraline from 50 to 75 mg.  Could also consider switching to BuSpar given some studies showing benefit for cerebellar ataxia. - 4/16: Patient endorses significant difficulty with adjustment and depression regarding perceived failure to progress, feeling like things will not get better.  Did encourage him regarding his daily functional games, and talked about stroke recovery extending up to a year.  No SI/HI.  Sertraline increased as above, will take a while to appreciate effects. -4/18: Spoke with nursing regarding nighttime interruptions for scheduled medications. DC staples today.  -4-19: Slept well overnight, no interruptions, using as needed so we will maintain current regimen over the weekend and further wean Monday.  -4/20-21/24 slept well and denied pain this weekend to me 4-22: Patient continues to endorse poor sleep, frequent interruptions.  5. Neuropsych/cognition: This patient is capable of making decisions on his own behalf. 6. Skin/Wound Care: Routine skin checks 7. Dysphagia/Fluids/Electrolytes/Nutrition: Routine in and outs with follow-up chemistries  - 3/29: Cr stable, BUN increased, encourage PO fluids and re-test Monday--ordered weekly; stable CR, BUN improved  4/1;  stable 4/3; stable 4/8 -08/02/22 takes Vitamin D 2000IU QD and Calcium supplement at home; reordered -08/10/22 pt dislikes diet order/purees and refuses to eat it, would prefer to go back to prior diet (bacon, eggs, mashed potatoes)-- not on SLP schedule today but spoke with speech therapy and asked if they could possibly do a quick eval to see if he can advance his diet today-- they will try to get to him today - 4.8: diet advanced to Dys 3; pt reports improved POS -4/10: Nutrition evaluated, meeting nutritional needs.  Supplements as needed.  Monitor -4/12: wet vocal quality and feel secretions interfering with POs; add loratidine 1 tab daily for drying -mildly improved, may remove later this week 4/15: Creatinine uptrending, although BUN remains stable and other electrolytes look good.  Encourage p.o. fluids.  Retest Thursday.  - 4/16: Will discuss possibility of utilizing provale cup to help with p.o. intakes, as patient remains under supervision of fluid intakes and this is limiting his ability to drink.  Did have mildly improved p.o.'s today of 580 cc. -4/17: Eating 40 to 50% of meals, p.o.'s remain about 400 to 500 cc.  Unable to use Provale  Supervision only.  Discontinue loratadine, vocal quality improved. -4/18: Advanced to free water protocol; patient refuses Provale  8.  Hyperlipidemia.  Crestor 20mg  daily 9.  Rheumatoid arthritis.  Consider resuming home regimen of rituximab, methotrexate and sulfasalazine after steroids completed (4/10).  Currently on Dexamethasone with taper. -08/02/22 wife concerned with restarting of meds; advised to readdress this once steroids have tapered around 08/13/22 -4-10: Wife requesting resumption of rituximab, methotrexate, sulfasalazine.  Theoretical risk of increased post-operative infection, however on lit review appears minimal. Will get CBC, CMP in AM to ensure WBC count decreases in setting of completed steroid taper, then resume. - 4-11: No signs or  symptoms of infection, white count downtrending after steroid treatment completed.  Will touch base with rheum (Dr. Corliss Skains) about restarting regimen; no direct contrainidications  identified.  -4/12: Per rheumatology, can restart all immunologics 2 weeks post-op (4/19) -ordered to start 4/20 AM with sulfasalazine 1000 mg twice daily, methotrexate 20 mg q. weekly; defer resumption of rituximab to outpatient rheumatologist.  Family in agreement.   10.  CAD.  Continue low-dose aspirin. Denies CP 11.  Dysphagia. Regular diet with nectar liquids. Follow up SLP 12.  Chronic systolic congestive heart failure.  Monitor for any signs of fluid overload. Daily weight. Currently HOLDING Spironolactone 12.5mg  QD (halved during last hospital stay), Lisinopril  QD, Metoprolol  QD  -3/30-4/5 wt stable, BP stable, continue to monitor -08/09/22 wt stable, BPs soft overnight but somewhat close to prior, improved this morning; monitor closely tonight, may need gentle hydration, hold off on labs for now -4/7-11/24 BPs stable monitor -4/11: Weights downtrending. BP stable. Monitor.  -4/12: mild hypotension today, asymptomatic. PO fluids poor d/t thickened liquids. BMP for AM. Encourage fludis -08/16/22 wt stable, BP still soft but stable, BMP unremarkable; monitor -08/17/22 wt/BP stable, cont to monitor  - 4/15: Intermittent hypotension, encourage p.o. fluids.  Patient endorses he is asymptomatic, requests DC IV today. -08-18-16: Blood pressures improved with better p.o. intakes, monitor -4/20-22/24 BPs soft but relatively stable, monitor closely; wt overall stable Filed Weights   08/22/22 0739 08/23/22 0500 08/24/22 0600  Weight: 89.7 kg 87.7 kg 88.1 kg   Vitals:   08/22/22 0513 08/22/22 1442 08/22/22 1948 08/23/22 0534  BP: 97/66 (!) 98/58 (!) 89/45 90/61   08/23/22 1356 08/23/22 2030 08/24/22 0600 08/24/22 1233  BP: 99/61 96/63 106/63 (!) 87/58   08/24/22 1456 08/24/22 1523 08/24/22 1940 08/25/22 0507   BP: (!) 93/49 97/62 111/65 106/61     13.  OSA. Intolerance to CPAP. Counseling on CPAP benefits.  14. Prediabetes. A1C 6.0. Monitor on CBC/CMP 15. Leukocytosis. Likely due to steroids. Stable. Monitor for s/s infection.    - 10.2->11->10.6 -> 12 4/3 ; monitor   - 4/8: 12.9 -> 11 4/11-.  9.3 4-15, normal  16. Macular degeneration: takes Preservision AREDS2 and Systane 0.6% eye drops at home -08/02/22 ordered hospital formulary alternatives- Lacrilube BID and Prosight multivitamin QD - 4/1-4/2: Wife concerned regarding use of alternative to systane; recommended bringing in from home  - 4/4: Pharmacy to assist in nonformulary order for home systane; ordered  17. R wrist bruising - stable, improving -08/02/22 wife concerned with bruises to R wrist, appear older/healing, no tenderness or crepitus/deformities, no swelling. Suspect minor pressure related bruise, doubt underlying bony injury, doubt need for xray; likely related to blood thinners; monitor for now  - 4/2: Mild HgB drop 10.6->9.8-> 10.1 -> 9.9 post-op  18.  Hydrocephalus s/p AVM resection and pseudomeningocele repair.   - 4/3: Cognitive slowing, increased dysarthria and ataxia.  See above, CT head and labs today for workup.  No focal weakness or sensory changes, no gross cranial nerve deficits to indicate need for acute stroke workup.  Last known normal would have been at bedtime last night. - 4/4: CT head concerning for worsening hydrocephalus. See above.  - 4/5:  Dr. Alanson Puls and Dr. Jake Samples recommending VP shunt, family agreeable, plan to OR 4/5 and return to [facility] if no complications -08/09/22 VP shunt placed last night, doing well, some mild abd discomfort but nondistended and no exquisite tenderness; pain control as above - 4.8: pending neurosurgery eval, per note may consider decreasing Strata II valve setting tomorrow to 1.0 or 0.5-adjusted to 1.0, with improvements - 4/10: CT head stable; Tx headache as above.  Discussed with  wife that imaging may take some time post-op to improve.  - 4/12: If no significant improvement by Monday, will ask for Dr. Conchita Paris to re-evaluate shunt settings (currently on 1.0) -4/15 -significant improvement in ataxia in right upper extremity and voice today.  Pseudomeningocele appears to be decreasing, continue to monitor. -4-16: Dr. Conchita Paris evaluated shunt overnight, no new recommendations. -4-18: Staples removed.  Doing well    LOS: 25 days A FACE TO FACE EVALUATION WAS PERFORMED  Angelina Sheriff 08/25/2022, 8:55 AM

## 2022-08-25 NOTE — Progress Notes (Signed)
Speech Language Pathology Discharge Summary  Patient Details  Name: Christopher Yu MRN: [id number] Date of Birth: 07-26-1960  Date of Discharge from SLP service:August 25, 2022  Today's Date: 08/25/2022 SLP Individual Time: 4098-1191 SLP Individual Time Calculation (min): 44 min   Skilled Therapeutic Interventions:  Pt seen for skilled ST with focus on cognitive goals, pt in bed and agreeable to therapeutic tasks. SLP reviewing swallow precautions as trained and recommendations for discharge on regular/NTL diet with outpatient SLP services. Pt verbalizes understanding. Pt benefits from min cues to recall comprehensive list of aspiration precautions and swallow strengthening exercises (CTAR, pitch glides, shaker and suprasubglottic swallow). Pt continues to require verbal cues to ID wet vocal quality at times, can clear effectively. SLP facilitating session by providing overall min A verbal cues for use of speech intelligibility strategies at simple conversation level for ~70% intelligibility this date. Pt with no further questions at this time, ST handouts in patient notebook.   Patient has met 6 of 6 long term goals.  Patient to discharge at overall Supervision;Min level.   Clinical Impression/Discharge Summary:   Pt has made consistent progress for ST during CIR and has met 6 out of 6 LTGs. Pt is discharging at Supervision level for swallow function on a regular diet with NTL via cup. Pt is Supervision level for attention and awareness goals as well. Pt is currently min A for expressive language goals, is ~70-80% intelligible at simple conversation level with use of compensatory strategies. It is recommended patient have 24/7 supervision at home with follow up OP SLP services. Recommend repeat MBSS in 3-4 weeks at discretion of OP SLP. Pt is encouraged to continue aggressive oral care and pharyngeal strengthening exercises independently at discharge. Pt and family provided ongoing  education during admission including hand outs with teach back.   Care Partner:  Caregiver Able to Provide Assistance: Yes  Type of Caregiver Assistance: Physical;Cognitive  Recommendation:  Outpatient SLP;24 hour supervision/assistance  Rationale for SLP Follow Up: Maximize functional communication;Maximize swallowing safety;Reduce caregiver burden   Reasons for discharge: Discharged from hospital   Patient/Family Agrees with Progress Made and Goals Achieved: Yes    Tacey Ruiz 08/25/2022, 12:04 PM

## 2022-08-25 NOTE — Progress Notes (Signed)
Occupational Therapy Discharge Summary  Patient Details  Name: Christopher Yu MRN: [id number] Date of Birth: July 18, 1960  Date of Discharge from OT service:August 25, 2022  Today's Date: 08/25/2022 OT Individual Time: 4098-1191 OT Individual Time Calculation (min): 72 min    Pt greeted semi-reclined in bed asleep and agreeable to OT treatment session. Pt completed stand-pivot to RW with RW and supervision. He brushed teeth mod I at the sink, then water protocol was performed. Pt with no coughing while drinking 4 oz of water via cup/. Pt transitioned to therapy gym. Pt given 9hole PEG test, UB home exercise program, home fine motor program, and therapitty exercises. Pt reported some pain in L shoulder today and declined to do L UE there-ex. Standing balance/endurance, and hand-eye coordination with putting activity. Pt with much improved overall balance while putting and standing without device. Pt ambulated back to room with RW and close supervision. Pt left semi-reclined in bed with bed alarm on, call bell in reach, and needs met.   Patient has met 10 of 10 long term goals due to improved activity tolerance, improved balance, postural control, ability to compensate for deficits, functional use of  RIGHT upper, RIGHT lower, LEFT upper, and LEFT lower extremity, improved attention, improved awareness, and improved coordination.  Patient to discharge at overall Supervision/CGA level.  Patient's care partner is independent to provide the necessary physical and cognitive assistance at discharge.    Reasons goals not met: n/a  Recommendation:  Patient will benefit from ongoing skilled OT services in outpatient setting to continue to advance functional skills in the area of BADL, functional use of B UE'S ( L UE has more coordination deficits.   Equipment: 3-in-1 BSC, wheelchair  Reasons for discharge: treatment goals met and discharge from hospital  Patient/family agrees with progress  made and goals achieved: Yes  OT Discharge Precautions/Restrictions  Precautions Precautions: Fall Restrictions Weight Bearing Restrictions: No Pain  Pt reported headache, rest and repositioned ADL ADL Eating: Set up Grooming: Supervision/safety Upper Body Bathing: Supervision/safety Lower Body Bathing: Supervision/safety Upper Body Dressing: Supervision/safety Lower Body Dressing: Supervision/safety Toileting: Supervision/safety Toilet Transfer: Close supervision Film/video editor: Contact guard ADL Comments: Has RW, has raised seat with handles over commode Praxis Praxis: Impaired Praxis Impairment Details: Motor planning Praxis-Other Comments: still impaired but improved since Cognition Cognition Overall Cognitive Status: Within Functional Limits for tasks assessed Arousal/Alertness: Awake/alert Orientation Level: Person;Place;Situation Person: Oriented Place: Oriented Situation: Oriented Memory: Appears intact Attention: Sustained Sustained Attention: Appears intact Awareness: Appears intact Problem Solving: Appears intact Safety/Judgment: Impaired Brief Interview for Mental Status (BIMS) Repetition of Three Words (First Attempt): 3 Temporal Orientation: Year: Correct Temporal Orientation: Month: Accurate within 5 days Temporal Orientation: Day: Correct Recall: "Sock": Yes, no cue required Recall: "Blue": Yes, no cue required Recall: "Bed": Yes, no cue required BIMS Summary Score: 15 Sensation Sensation Light Touch: Appears Intact Coordination Gross Motor Movements are Fluid and Coordinated: No Fine Motor Movements are Fluid and Coordinated: No Coordination and Movement Description: Ataxic movement patterns, however improved since initial evaluation- Impaired R LE proprioception 9 Hole Peg Test: R:38, L:56 Motor  Motor Motor: Ataxia Motor - Skilled Clinical Observations: Ataxic movements patterns with impaired coordination, but much  improved Mobility  Bed Mobility Bed Mobility: Rolling Right;Rolling Left;Supine to Sit;Sit to Supine Rolling Right: Independent Rolling Left: Independent Supine to Sit: Independent Sit to Supine: Independent Transfers Sit to Stand: Supervision/Verbal cueing Stand to Sit: Supervision/Verbal cueing  Trunk/Postural Assessment  Cervical Assessment Cervical Assessment: Within Functional  Limits Thoracic Assessment Thoracic Assessment: Within Functional Limits Lumbar Assessment Lumbar Assessment: Within Functional Limits  Balance Static Sitting Balance Static Sitting - Balance Support: No upper extremity supported;Feet supported Static Sitting - Level of Assistance: 6: Modified independent (Device/Increase time) Dynamic Sitting Balance Dynamic Sitting - Balance Support: During functional activity;Feet supported Dynamic Sitting - Level of Assistance: 6: Modified independent (Device/Increase time) Static Standing Balance Static Standing - Balance Support: During functional activity;Bilateral upper extremity supported Static Standing - Level of Assistance: 5: Stand by assistance Dynamic Standing Balance Dynamic Standing - Balance Support: During functional activity;Bilateral upper extremity supported Dynamic Standing - Level of Assistance: 5: Stand by assistance;4: Min assist Extremity/Trunk Assessment RUE Assessment RUE Assessment: Within Functional Limits LUE Assessment LUE Assessment: Within Functional Limits General Strength Comments: 5/5 strength, coordination improving   Mal Amabile 08/25/2022, 2:49 PM

## 2022-08-25 NOTE — Progress Notes (Signed)
Physical Therapy Discharge Summary  Patient Details  Name: Christopher Yu MRN: [id number] Date of Birth: 14-Feb-1961  Date of Discharge from PT service:August 25, 2022  Today's Date: 08/25/2022 PT Individual Time: 1000-1115 PT Individual Time Calculation (min): 75 min    Patient has met 8 of 8 long term goals due to improved activity tolerance, improved balance, improved postural control, increased strength, decreased pain, ability to compensate for deficits, improved attention, improved awareness, and improved coordination.  Patient to discharge at an ambulatory level  CGA/SBA .   Patient's care partner is independent to provide the necessary physical and cognitive assistance at discharge.  Reasons goals not met: N/A  Recommendation:  Patient will benefit from ongoing skilled PT services in outpatient setting to continue to advance safe functional mobility, address ongoing impairments in dynamic stability, coordination, global strengthening, gait, transfers, stair mobility, safety awareness and minimize fall risk.  Equipment: 16" manual wheelchair, cushion - Patient already owns a RW  Reasons for discharge: treatment goals met and discharge from hospital  Patient/family agrees with progress made and goals achieved: Yes  PT Discharge Precautions/Restrictions Precautions Precautions: Fall Restrictions Weight Bearing Restrictions: No Other Position/Activity Restrictions: Ataxia, impulsive, impaired problem-solving- Much improved since initial evaluation Pain Pain Assessment Pain Scale: 0-10 Pain Score: 0-No pain Pain Type: Acute pain Pain Location: Shoulder Pain Orientation: Right;Posterior Pain Descriptors / Indicators: Aching Pain Frequency: Constant Pain Onset: On-going Pain Intervention(s): Medication (See eMAR) Pain Interference Pain Interference Pain Effect on Sleep: 2. Occasionally Pain Interference with Therapy Activities: 1. Rarely or not at all Pain  Interference with Day-to-Day Activities: 1. Rarely or not at all Vision/Perception  Vision - History Ability to See in Adequate Light: 0 Adequate Perception Perception: Impaired Spatial Orientation: Patient with R lateral bias with functional mobility Praxis Praxis: Impaired Praxis Impairment Details: Motor planning Praxis-Other Comments: Impaired coordination, but much improved  Cognition Overall Cognitive Status: Within Functional Limits for tasks assessed Arousal/Alertness: Awake/alert Orientation Level: Oriented X4 Year: 2024 Month: April Day of Week: Correct Attention: Sustained Sustained Attention: Appears intact Memory: Appears intact Awareness: Appears intact Problem Solving: Appears intact Safety/Judgment: Impaired Sensation Sensation Light Touch: Appears Intact Coordination Gross Motor Movements are Fluid and Coordinated: No Fine Motor Movements are Fluid and Coordinated: No Coordination and Movement Description: Ataxic movement patterns, however improved since initial evaluation- Impaired R LE proprioception Motor  Motor Motor: Ataxia Motor - Skilled Clinical Observations: Ataxic movements patterns with impaired coordination, but much improved  Mobility Bed Mobility Bed Mobility: Rolling Right;Rolling Left;Supine to Sit;Sit to Supine Rolling Right: Independent Rolling Left: Independent Supine to Sit: Independent Sit to Supine: Independent Transfers Sit to Stand: Supervision/Verbal cueing Stand to Sit: Supervision/Verbal cueing Stand Pivot Transfers: Supervision/Verbal cueing Transfer (Assistive device): Rolling walker Locomotion  Gait Ambulation: Yes Gait Assistance: Supervision/Verbal cueing;Contact Guard/Touching assist Gait Distance (Feet): 150 Feet Assistive device: Rolling walker Gait Assistance Details: Verbal cues for technique;Verbal cues for safe use of DME/AE;Verbal cues for precautions/safety;Verbal cues for gait pattern Gait Gait:  Yes Gait velocity: Decreased Stairs / Additional Locomotion Stairs: Yes Stairs Assistance: Supervision/Verbal cueing Stair Management Technique: One rail Right Number of Stairs: 12 Height of Stairs: 6 Ramp: Supervision/Verbal cueing Curb: Doctor, hospital Mobility: Yes Wheelchair Assistance: Independent with Scientist, research (life sciences): Both upper extremities Wheelchair Parts Management: Supervision/cueing Distance: >150'  Trunk/Postural Assessment  Cervical Assessment Cervical Assessment: Within Functional Limits Thoracic Assessment Thoracic Assessment: Within Functional Limits Lumbar Assessment Lumbar Assessment: Within Functional Limits Postural Control Postural Control: Deficits  on evaluation Righting Reactions: Delayed, but improved  Balance Balance Balance Assessed: Yes Static Sitting Balance Static Sitting - Balance Support: No upper extremity supported;Feet supported Static Sitting - Level of Assistance: 6: Modified independent (Device/Increase time) Dynamic Sitting Balance Dynamic Sitting - Balance Support: During functional activity;Feet supported Dynamic Sitting - Level of Assistance: 6: Modified independent (Device/Increase time) Static Standing Balance Static Standing - Balance Support: During functional activity;Bilateral upper extremity supported Static Standing - Level of Assistance: 5: Stand by assistance Dynamic Standing Balance Dynamic Standing - Balance Support: During functional activity;Bilateral upper extremity supported Dynamic Standing - Level of Assistance: 5: Stand by assistance;4: Min assist (CGA) Extremity Assessment      RLE Assessment RLE Assessment: Within Functional Limits General Strength Comments: Gross strength WFL, however limited by impaired coordination- improved since initial evaluation LLE Assessment LLE Assessment: Exceptions to [facility] General Strength Comments: 4+/5- limited  by impaired coordination, but much improved  Skilled Intervention: Patient greeted semi-reclined in bed and agreeable to PT treatment session- Patient's DME was delivered and therapist adjusted the manual wheelchair for proper body mechanics. Patient performed various sit/stands throughout treatment session with RW and SBA/upv- Patient also performed various stand pivot transfers with RW and SBA. Patient propelled manual wheelchair to/from room and rehab gym with B UE and ModI. Patient completed a car transfer, ascended/descended a low grade ramp, ambulated >150' with RW and ascended/descended 12 steps with R HR and CGA/Supv for safety. Patient required CGA for longer gait distances secondary to fatigue and minor instability, however supv assistance required for everything else with good stability noted. Patient was provided with a HEP (please see below for further details) in order to maintain/increased LE strength and balance for improved functional mobility. Patient completed Nustep x10 minutes at level 5 with B LE only as patient reports it hurts his UE. Patient returned to his room and left semi-reclined in bed with bed alarm on, call bell within reach and all needs met.   Access Code: [email or web address] URL: [email or web address] Date: 08/25/2022 Prepared by: Sherron Ales Temeca Somma  Exercises - Sit to Stand Without Arm Support - Standing Tandem Balance with Counter Support   - Wide Stance with Head Rotations and Unilateral Counter Support  - Narrow Stance with Head Nods and Counter Support - Mini Squat with Counter Support  - Standing Hip Abduction with Counter Support  - Standing 3-Way Leg Reach with Resistance at Ankles and Counter Support  - Standing March with Counter Support  - Supine Bridge with Resistance Band     Elira Colasanti 08/25/2022, 10:25 AM

## 2022-08-25 NOTE — Progress Notes (Signed)
Inpatient Rehabilitation Discharge Medication Review by a Pharmacist  A complete drug regimen review was completed for this patient to identify any potential clinically significant medication issues.  High Risk Drug Classes Is patient taking? Indication by Medication  Antipsychotic No   Anticoagulant No   Antibiotic No   Opioid No Oxycodone - pain  Antiplatelet Yes ASA - CAD  Hypoglycemics/insulin No   Vasoactive Medication Yes Aldactone - HTN  Chemotherapy No   Other Yes Rosuvastatin - HLD Sertraline - Mood Diazepam prn spasms Vitamin D- supplementation Lyrica - neuropathy Melatonin - sleep Voltaren gel - pain Methotrexate/Rituxan/sulfasalazine - RA Vitamin D - supplementation     Type of Medication Issue Identified Description of Issue Recommendation(s)  Drug Interaction(s) (clinically significant)     Duplicate Therapy     Allergy     No Medication Administration End Date     Incorrect Dose     Additional Drug Therapy Needed     Significant med changes from prior encounter (inform family/care partners about these prior to discharge).    Other       Clinically significant medication issues were identified that warrant physician communication and completion of prescribed/recommended actions by midnight of the next day:  No  Pharmacist comments: metoprolol, spironolactone, lisinopril, gabapentin, methotrexate, sulfasalazine on hold (on discharge order medication list to be reconciled).  Time spent performing this drug regimen review (minutes):  30 minutes  Ulyses Southward, PharmD, [place], AAHIVP, CPP Infectious Disease Pharmacist 08/25/2022 7:52 AM

## 2022-08-26 NOTE — Progress Notes (Signed)
Inpatient Rehabilitation Care Coordinator Discharge Note   Patient Details  Name: Christopher Yu MRN: [id number] Date of Birth: 1960-05-31   Discharge location: D/c to home with support from wife  Length of Stay: 25 days  Discharge activity level: CGA to Min Asst  Home/community participation: Limited  Patient response [id number] Literacy - How often do you need to have someone help you when you read instructions, pamphlets, or other written material from your doctor or pharmacy?: Never  Patient response [id number] Isolation - How often do you feel lonely or isolated from those around you?: Never  Services provided included: MD, RD, PT, OT, CM, RN, SLP, TR, Pharmacy, Neuropsych, SW  Financial Services:  Field seismologist Utilized: [employer]  Choices offered to/list presented to: pt and wife  Follow-up services arranged:  Outpatient, DME    Outpatient Servicies: [facility] for PT/OT/SLP DME : [facility] for w/c and 3in1 BSC    Patient response to transportation need: Is the patient able to respond to transportation needs?: Yes In the past 12 months, has lack of transportation kept you from medical appointments or from getting medications?: No In the past 12 months, has lack of transportation kept you from meetings, work, or from getting things needed for daily living?: No   Patient/Family verbalized understanding of follow-up arrangements:  Yes  Individual responsible for coordination of the follow-up plan: contact pt wife Christopher Yu [phone]  Confirmed correct DME delivered: Gretchen Short 08/26/2022    Comments (or additional information):  Summary of Stay    Date/Time Discharge Planning CSW  08/18/22 1420 Pt will discharge to home with support from his wife, and PRN support from their children. Fam edu completed with his wife on Friday (4/12) and Monday (4/15). SW will confirm there are no barriers to discharge. AAC  08/11/22  1431 Pt will discharge to home with support from his wife, and PRN support from their children. SW will confirm there are no barriers to discharge. AAC  08/04/22 1327 Pt will discharge to home with support from his wife, and PRN support from their children. SW will confirm there are no barriers to discharge. AAC       Fujie Dickison A Lula Olszewski

## 2022-08-26 NOTE — Progress Notes (Signed)
PROGRESS NOTE   Subjective/Complaints: Patient seen and evaluated at bedside with wife.  All questions regarding discharge answered.  Patient has prednisone at home, given no clear answer on need to hold or continue rheumatologic medications recommended that he touch base with his rheumatologist about a steroid taper for his left shoulder pain and continue regimen per their instructions.  Dr. Conchita Paris evaluated yesterday, adjusted shunt to 0.5.  Discussed with family reasons why shunt may have been adjusted, but ultimately recommended they discuss this with Dr. Conchita Paris.  Outpatient follow-up pending.  ROS:  + Adjustment difficulty with depressed mood - improving +dysarthria c/b wet vocal quality-improved +HAs/postop head pain-ongoing, improving +insomnia-improved Denies fevers, chills, abd pain, N/V, constipation, diarrhea, SOB, cough, chest pain, new weakness or paraesthesias.    Objective:   No results found. Recent Labs    08/25/22 0601  WBC 6.9  HGB 9.3*  HCT 29.3*  PLT 143*      Recent Labs    08/25/22 0601  NA 141  K 3.7  CL 106  CO2 28  GLUCOSE 104*  BUN 15  CREATININE 0.83  CALCIUM 8.7*      Intake/Output Summary (Last 24 hours) at 08/26/2022 1253 Last data filed at 08/26/2022 0246 Gross per 24 hour  Intake 120 ml  Output 1175 ml  Net -1055 ml         Physical Exam: Vital Signs Blood pressure (!) 97/59, pulse (!) 55, temperature 98 F (36.7 C), resp. rate 17, height 6\' 1"  (1.854 m), weight 88.1 kg, SpO2 95 %.    General: alert, laying in bed, sitting upright and awake, no acute distress  HEENT:  R scalp  - shunt in place, staples s/p removal.  PERRLA.   4/17: Right beating nystagmus, most apparent on leftward gaze. Some difficulty with convergence -mildly improved  Neck: Supple without JVD Heart: Reg rate and rhythm. No murmurs rubs or gallops Chest: CTA bilaterally without wheezes,  rales, or rhonchi; no distress.  Abdomen: Soft, nonTTP, non-distended, bowel sounds positive. Multiple laparoscopic incisions with glue, c/d/i Extremities: No clubbing, cyanosis, or edema.   Psych: Pt's affect is appropriate . Pt is cooperative.   Neuro: speech apraxia and dysarthria-slowly improving Skin: Clean and intact without signs of breakdown,  healed incision/scar to posterior neck, with fluctuant fluid-wrist from prior exam, tenderness decreasing + Staples removed, shunt site c/d/I.  + Abdominal trochar sites well approximated with dermabond, c/d/I.    Neuro: Alert and oriented x 4.  + Right facial droop -mild  + Speech apraxia and dysarthria -stable  strength antigravity and against resistance in bilateral upper extremities and lower extremities Sensation intact light touch in all 4 extremities + To palpation over fluctuant fluid site on posterior neck, bilateral cervical paraspinals, forehead, and temples-less sharp, more generalized discomfort  + FTN  altered  improving daily-mild bilaterally, much improved  Musculoskeletal: Normal bulk and tone, no joint swelling noted.  Moving all 4 limbs antigravity and against resistance.  Limited antigravity range of motion with right shoulder due to diffuse arthritic pain.    Assessment/Plan: 1. Functional deficits which require 3+ hours per day of interdisciplinary therapy in a comprehensive inpatient rehab  setting. Physiatrist is providing close team supervision and 24 hour management of active medical problems listed below. Physiatrist and rehab team continue to assess barriers to discharge/monitor patient progress toward functional and medical goals  Care Tool:  Bathing    Body parts bathed by patient: Right arm, Left arm, Chest, Abdomen, Buttocks, Front perineal area, Left upper leg, Right lower leg, Left lower leg, Face, Right upper leg         Bathing assist Assist Level: Supervision/Verbal cueing     Upper Body  Dressing/Undressing Upper body dressing   What is the patient wearing?: Pull over shirt    Upper body assist Assist Level: Independent with assistive device    Lower Body Dressing/Undressing Lower body dressing      What is the patient wearing?: Underwear/pull up, Pants     Lower body assist Assist for lower body dressing: Supervision/Verbal cueing     Toileting Toileting    Toileting assist Assist for toileting: Supervision/Verbal cueing     Transfers Chair/bed transfer  Transfers assist     Chair/bed transfer assist level: Supervision/Verbal cueing     Locomotion Ambulation   Ambulation assist      Assist level: Contact Guard/Touching assist Assistive device: Walker-rolling Max distance: >200'   Walk 10 feet activity   Assist     Assist level: Supervision/Verbal cueing Assistive device: Walker-rolling   Walk 50 feet activity   Assist    Assist level: Contact Guard/Touching assist Assistive device: Walker-rolling    Walk 150 feet activity   Assist    Assist level: Contact Guard/Touching assist Assistive device: Walker-rolling    Walk 10 feet on uneven surface  activity   Assist     Assist level: Contact Guard/Touching assist Assistive device: Walker-rolling   Wheelchair     Assist Is the patient using a wheelchair?: Yes Type of Wheelchair: Manual    Wheelchair assist level: Independent Max wheelchair distance: 150+ feet    Wheelchair 50 feet with 2 turns activity    Assist        Assist Level: Independent   Wheelchair 150 feet activity     Assist      Assist Level: Independent   Blood pressure (!) 97/59, pulse (!) 55, temperature 98 F (36.7 C), resp. rate 17, height 6\' 1"  (1.854 m), weight 88.1 kg, SpO2 95 %.   Medical Problem List and Plan: 1. Functional deficits secondary to cerebellar CVA, c/b recent pseudomeningocele repair and Hx AVM resection December 2023..     - 4/4: Note Dr. Alanson Puls  stating patient likely did, in fact, have a cerebellar stroke and not just post-operative changes as previously noted. Changing primary Dx to cerebellar infarct.           -patient may shower             -ELOS/Goals: 7-10 days, Mod I PT/OT/SLP; DC goal extended to 4/23             -Stable for discharge today -Decadron as indicated 4 MG Q8 HOURS with TAPER STARTING 3/29 to 4/10 - 4/3: Worsening hydrocephalus on CT ->VP shunt placment 4/5 -> adjusted 4/8 to 1.0 with some subjective improvement in ataxia on exam  - 4/10: CT head for worsening headaches, stable.  Treatment as below. - 4-16: Vestibular evaluation by PT-per their assessment, difficulty with smooth pursuit and saccades, more prominent in R eye; complicated by poor insight/awareness of when his eyes are not actually on the target (if he is seeing  it slightly from his peripheral vision)  4-22: Dr. Conchita Paris to evaluate shunt today; adjusted to 0.5  2.  Antithrombotics: -DVT/anticoagulation:  Pharmaceutical: Lovenox 40mg  QD -antiplatelet therapy: Aspirin 81 mg daily- currently held 08/09/22 d/t surgery, spoke with Dr. Conchita Paris 08/09/22, states it should restart in 1-2 days - > resume 4/9  3. Pain Management: Tylenol as needed, Diazepam 5mg  q8h PRN muscle spasms, Voltaren 1% 2g QID - 3/29: Schedule Tylenol 1000 mg TID, add gabapentin 300 mg QHS for headache and sleep -08/02/22 pt previously on Hydrocodone-APAP 5-325mg  at prior discharge, was still using this, would like to keep; reordered for now, 5-325mg  q6h PRN x7 days, if needed longer then weekday team to discuss; adjusted tylenol to 650mg  TID to avoid overdosing of tylenol; would also prefer gabapentin be scheduled BID rather than PRN -08/03/22 - 4/1 improved control, cont regimen for now -08/09/22 Hydrocodone 5-325mg  q6h PRN readded since he had surgery again yesterday; also added morphine 4mg  q4h PRN in case he needs IV pain control postoperatively; monitor closely -08/11/22 pain ongoing,  monitor and convert to PO regimen in AM. For HA, Increase gabapentin from 300 mg BID to 300/300/600 -Sleep and headache improved with gabapentin, monitor -4/10: Headache worse, diffuse this a.m.  CT head stable.  Patient did not tolerate discontinuation of as needed IV morphine.  Scheduled Norco every 6, added Topamax 25 mg twice daily, reduce morphine to 2 mg IV twice daily as needed. -4/10: Maintain Topamax 25 mg twice daily, Norco scheduled.  Increase gabapentin to 600 mg 3 times daily, increase as needed morphine to 3 mg IV twice daily.  Goal to wean off IV off by the end of the week. -4/12: Increase topomax to 50 mg BID. Weaning morphine PRN over the weekend to 2 mg BID PRN 4/13, 1 mg BID PRN 4/14, then DC.   -4/13-14/24 pain managed for now, monitor with weaning morphine - 4/15: Weaned off of morphine starting today, continue scheduled pain regimen with plan to wean down later this week. ?  Association with pressure on pseudomeningocele in the back of the head.  May benefit from a cervical collar, however this could also increase the pressure make it worse.  Will look at repositioning options today. -4/16: Switch gabapentin 600 mg 3 times daily to Lyrica 200 mg twice daily.  Read time 4 times daily Norco 5 mg to allow sleep from 2200 to 0600.  Added on at 2 additional PRNs of oxycodone 5 mg for pain control if needed with longer intervals during sleep.  Discontinue Topamax due to no benefit and possible effects on mood. -4-17: Improved, continue current regimen.  Using both PRNs.  Worsens with activity. 4-22: Requesting steroid taper due to chronic right shoulder pain.  Will look into any needed RA medication adjustments with this, if needed may initiate prior to discharge. -23: 2 discussed with rheumatology steroid taper as outpatient.  2-week medication supply of pain medication on discharge.  Would not renew.  4. Mood/Behavior/Sleep: Zoloft 50 mg daily>> inc to 75mg  QD              -antipsychotic agents: N/A   -3/29: Gabapentin as above, melatonin 5mg  QHS PRN  -08/02/22 scheduled Gabapentin BID and melatonin QHS per request  -08/03/22 greatly improved sleep - continue  -4/2: Added trazodone 50 mg PRN for ?insomnia; not endorsed by patient.  -4/3: Patient endorses ongoing insomnia, uncertain etiology.  As needed trazodone not utilized, will schedule starting tonight. - sleep stable -08/09/22 slept poorly  but suspect could be from missing some meds yesterday and postop recovery; monitor for now -08/10/22 sleep gradually improving, monitor - 4/8: remains poor d.t HA, pain; tx as above - 4/9: Endorses worsening depression, more labile mood secondary to frustration regarding his diagnoses.  Agreeable to increasing antidepressant sertraline from 50 to 75 mg.  Could also consider switching to BuSpar given some studies showing benefit for cerebellar ataxia. - 4/16: Patient endorses significant difficulty with adjustment and depression regarding perceived failure to progress, feeling like things will not get better.  Did encourage him regarding his daily functional games, and talked about stroke recovery extending up to a year.  No SI/HI.  Sertraline increased as above, will take a while to appreciate effects. -4/18: Spoke with nursing regarding nighttime interruptions for scheduled medications. DC staples today.  -4-19: Slept well overnight, no interruptions, using as needed so we will maintain current regimen over the weekend and further wean Monday.  -4/20-21/24 slept well and denied pain this weekend to me 4-22: Patient continues to endorse poor sleep, frequent interruptions.  5. Neuropsych/cognition: This patient is capable of making decisions on his own behalf. 6. Skin/Wound Care: Routine skin checks 7. Dysphagia/Fluids/Electrolytes/Nutrition: Routine in and outs with follow-up chemistries  - 3/29: Cr stable, BUN increased, encourage PO fluids and re-test Monday--ordered weekly;  stable CR, BUN improved  4/1; stable 4/3; stable 4/8 -08/02/22 takes Vitamin D 2000IU QD and Calcium supplement at home; reordered -08/10/22 pt dislikes diet order/purees and refuses to eat it, would prefer to go back to prior diet (bacon, eggs, mashed potatoes)-- not on SLP schedule today but spoke with speech therapy and asked if they could possibly do a quick eval to see if he can advance his diet today-- they will try to get to him today - 4.8: diet advanced to Dys 3; pt reports improved POS -4/10: Nutrition evaluated, meeting nutritional needs.  Supplements as needed.  Monitor -4/12: wet vocal quality and feel secretions interfering with POs; add loratidine 1 tab daily for drying -mildly improved, may remove later this week 4/15: Creatinine uptrending, although BUN remains stable and other electrolytes look good.  Encourage p.o. fluids.  Retest Thursday.  - 4/16: Will discuss possibility of utilizing provale cup to help with p.o. intakes, as patient remains under supervision of fluid intakes and this is limiting his ability to drink.  Did have mildly improved p.o.'s today of 580 cc. -4/17: Eating 40 to 50% of meals, p.o.'s remain about 400 to 500 cc.  Unable to use Provale  Supervision only.  Discontinue loratadine, vocal quality improved. -4/18: Advanced to free water protocol; patient refuses Provale  8.  Hyperlipidemia.  Crestor  daily 9.  Rheumatoid arthritis.  Consider resuming home regimen of rituximab, methotrexate and sulfasalazine after steroids completed (4/10).  Currently on Dexamethasone with taper. -08/02/22 wife concerned with restarting of meds; advised to readdress this once steroids have tapered around 08/13/22 -4-10: Wife requesting resumption of rituximab, methotrexate, sulfasalazine.  Theoretical risk of increased post-operative infection, however on lit review appears minimal. Will get CBC, CMP in AM to ensure WBC count decreases in setting of completed steroid taper, then  resume. - 4-11: No signs or symptoms of infection, white count downtrending after steroid treatment completed.  Will touch base with rheum (Dr. Corliss Skains) about restarting regimen; no direct contrainidications identified.  -4/12: Per rheumatology, can restart all immunologics 2 weeks post-op (4/19) -ordered to start 4/20 AM with sulfasalazine 1000 mg twice daily, methotrexate 20 mg q. weekly; defer  resumption of rituximab to outpatient rheumatologist.  Family in agreement.   10.  CAD.  Continue low-dose aspirin. Denies CP 11.  Dysphagia. Regular diet with nectar liquids. Follow up SLP 12.  Chronic systolic congestive heart failure.  Monitor for any signs of fluid overload. Daily weight. Currently HOLDING Spironolactone 12.5mg  QD (halved during last hospital stay), Lisinopril 10mg  QD, Metoprolol 25mg  QD  -3/30-4/5 wt stable, BP stable, continue to monitor -08/09/22 wt stable, BPs soft overnight but somewhat close to prior, improved this morning; monitor closely tonight, may need gentle hydration, hold off on labs for now -4/7-11/24 BPs stable monitor -4/11: Weights downtrending. BP stable. Monitor.  -4/12: mild hypotension today, asymptomatic. PO fluids poor d/t thickened liquids. BMP for AM. Encourage fludis -08/16/22 wt stable, BP still soft but stable, BMP unremarkable; monitor -08/17/22 wt/BP stable, cont to monitor  - 4/15: Intermittent hypotension, encourage p.o. fluids.  Patient endorses he is asymptomatic, requests DC IV today. -08-18-16: Blood pressures improved with better p.o. intakes, monitor -4/20-22/24 BPs soft but relatively stable, monitor closely; wt overall stable Filed Weights   08/22/22 0739 08/23/22 0500 08/24/22 0600  Weight: 89.7 kg 87.7 kg 88.1 kg   Vitals:   08/23/22 0534 08/23/22 1356 08/23/22 2030 08/24/22 0600  BP: 90/61 99/61 96/63  106/63   08/24/22 1233 08/24/22 1456 08/24/22 1523 08/24/22 1940  BP: (!) 87/58 (!) 93/49 97/62 111/65   08/25/22 0507 08/25/22 1237  08/25/22 1932 08/26/22 0430  BP: 106/61 105/70 (!) 100/59 (!) 97/59     13.  OSA. Intolerance to CPAP. Counseling on CPAP benefits.  14. Prediabetes. A1C 6.0. Monitor on CBC/CMP 15. Leukocytosis. Likely due to steroids. Stable. Monitor for s/s infection.    - 10.2->11->10.6 -> 12 4/3 ; monitor   - 4/8: 12.9 -> 11 4/11-.  9.3 4-15, normal  16. Macular degeneration: takes Preservision AREDS2 and Systane 0.6% eye drops at home -08/02/22 ordered hospital formulary alternatives- Lacrilube BID and Prosight multivitamin QD - 4/1-4/2: Wife concerned regarding use of alternative to systane; recommended bringing in from home  - 4/4: Pharmacy to assist in nonformulary order for home systane; ordered  17. R wrist bruising - stable, improving -08/02/22 wife concerned with bruises to R wrist, appear older/healing, no tenderness or crepitus/deformities, no swelling. Suspect minor pressure related bruise, doubt underlying bony injury, doubt need for xray; likely related to blood thinners; monitor for now  - 4/2: Mild HgB drop 10.6->9.8-> 10.1 -> 9.9 post-op  18.  Hydrocephalus s/p AVM resection and pseudomeningocele repair.   - 4/3: Cognitive slowing, increased dysarthria and ataxia.  See above, CT head and labs today for workup.  No focal weakness or sensory changes, no gross cranial nerve deficits to indicate need for acute stroke workup.  Last known normal would have been at bedtime last night. - 4/4: CT head concerning for worsening hydrocephalus. See above.  - 4/5:  Dr. Alanson Puls and Dr. Jake Samples recommending VP shunt, family agreeable, plan to OR 4/5 and return to [facility] if no complications -08/09/22 VP shunt placed last night, doing well, some mild abd discomfort but nondistended and no exquisite tenderness; pain control as above - 4.8: pending neurosurgery eval, per note may consider decreasing Strata II valve setting tomorrow to 1.0 or 0.5-adjusted to 1.0, with improvements - 4/10: CT head stable; Tx  headache as above. Discussed with wife that imaging may take some time post-op to improve.  - 4/12: If no significant improvement by Monday, will ask for Dr. Conchita Paris to re-evaluate shunt  settings (currently on 1.0) -4/15 -significant improvement in ataxia in right upper extremity and voice today.  Pseudomeningocele appears to be decreasing, continue to monitor. -4-16: Dr. Conchita Paris evaluated shunt overnight, no new recommendations. -4-18: Staples removed.  Doing well    LOS: 26 days A FACE TO FACE EVALUATION WAS PERFORMED  Christopher Yu 08/26/2022, 12:53 PM

## 2022-08-27 ENCOUNTER — Other Ambulatory Visit: Payer: Self-pay

## 2022-08-27 ENCOUNTER — Encounter (HOSPITAL_COMMUNITY): Payer: Self-pay | Admitting: *Deleted

## 2022-08-27 ENCOUNTER — Emergency Department (HOSPITAL_COMMUNITY)
Admission: EM | Admit: 2022-08-27 | Discharge: 2022-08-27 | Disposition: A | Discharge status: Home / Self Care | Payer: BC Managed Care – PPO | Attending: Emergency Medicine | Admitting: Emergency Medicine

## 2022-08-27 ENCOUNTER — Emergency Department (HOSPITAL_COMMUNITY): Payer: BC Managed Care – PPO

## 2022-08-27 DIAGNOSIS — R112 Nausea with vomiting, unspecified: Secondary | ICD-10-CM | POA: Insufficient documentation

## 2022-08-27 DIAGNOSIS — H539 Unspecified visual disturbance: Secondary | ICD-10-CM

## 2022-08-27 DIAGNOSIS — I5022 Chronic systolic (congestive) heart failure: Secondary | ICD-10-CM | POA: Diagnosis not present

## 2022-08-27 DIAGNOSIS — R4781 Slurred speech: Secondary | ICD-10-CM | POA: Insufficient documentation

## 2022-08-27 DIAGNOSIS — H532 Diplopia: Secondary | ICD-10-CM | POA: Diagnosis not present

## 2022-08-27 DIAGNOSIS — H538 Other visual disturbances: Secondary | ICD-10-CM | POA: Diagnosis not present

## 2022-08-27 DIAGNOSIS — G919 Hydrocephalus, unspecified: Secondary | ICD-10-CM | POA: Diagnosis not present

## 2022-08-27 DIAGNOSIS — Z8673 Personal history of transient ischemic attack (TIA), and cerebral infarction without residual deficits: Secondary | ICD-10-CM | POA: Diagnosis not present

## 2022-08-27 DIAGNOSIS — I2510 Atherosclerotic heart disease of native coronary artery without angina pectoris: Secondary | ICD-10-CM | POA: Diagnosis not present

## 2022-08-27 LAB — CBC WITH DIFFERENTIAL/PLATELET
Abs Immature Granulocytes: 0.02 10*3/uL (ref 0.00–0.07)
Basophils Absolute: 0 10*3/uL (ref 0.0–0.1)
Basophils Relative: 0 %
Eosinophils Absolute: 0.1 10*3/uL (ref 0.0–0.5)
Eosinophils Relative: 1 %
HCT: 33.7 % — ABNORMAL LOW (ref 39.0–52.0)
Hemoglobin: 10.5 g/dL — ABNORMAL LOW (ref 13.0–17.0)
Immature Granulocytes: 0 %
Lymphocytes Relative: 12 %
Lymphs Abs: 1 10*3/uL (ref 0.7–4.0)
MCH: 23.4 pg — ABNORMAL LOW (ref 26.0–34.0)
MCHC: 31.2 g/dL (ref 30.0–36.0)
MCV: 75.2 fL — ABNORMAL LOW (ref 80.0–100.0)
Monocytes Absolute: 0.3 10*3/uL (ref 0.1–1.0)
Monocytes Relative: 4 %
Neutro Abs: 6.7 10*3/uL (ref 1.7–7.7)
Neutrophils Relative %: 83 %
Platelets: 192 10*3/uL (ref 150–400)
RBC: 4.48 MIL/uL (ref 4.22–5.81)
RDW: 21.7 % — ABNORMAL HIGH (ref 11.5–15.5)
WBC: 8.1 10*3/uL (ref 4.0–10.5)
nRBC: 0 % (ref 0.0–0.2)

## 2022-08-27 LAB — COMPREHENSIVE METABOLIC PANEL
ALT: 27 U/L (ref 0–44)
AST: 21 U/L (ref 15–41)
Albumin: 3.7 g/dL (ref 3.5–5.0)
Alkaline Phosphatase: 83 U/L (ref 38–126)
Anion gap: 11 (ref 5–15)
BUN: 14 mg/dL (ref 8–23)
CO2: 24 mmol/L (ref 22–32)
Calcium: 9.3 mg/dL (ref 8.9–10.3)
Chloride: 106 mmol/L (ref 98–111)
Creatinine, Ser: 0.78 mg/dL (ref 0.61–1.24)
GFR, Estimated: >60 mL/min (ref >60)
Glucose, Bld: 136 mg/dL — ABNORMAL HIGH (ref 70–99)
Potassium: 3.6 mmol/L (ref 3.5–5.1)
Sodium: 141 mmol/L (ref 135–145)
Total Bilirubin: 0.7 mg/dL (ref 0.3–1.2)
Total Protein: 7.2 g/dL (ref 6.5–8.1)

## 2022-08-27 LAB — MAGNESIUM: Magnesium: 1.9 mg/dL (ref 1.7–2.4)

## 2022-08-27 MED ORDER — METOCLOPRAMIDE HCL 5 MG/ML IJ SOLN
10.0000 mg | Freq: Once | INTRAMUSCULAR | Status: AC
  Administered 2022-08-27: 10 mg via INTRAVENOUS
  Filled 2022-08-27: qty 2

## 2022-08-27 MED ORDER — METOCLOPRAMIDE HCL 10 MG PO TABS: 10.0000 mg | ORAL_TABLET | Freq: Four times a day (QID) | ORAL | 0 refills | Status: DC

## 2022-08-27 NOTE — Progress Notes (Signed)
Neurosurgery  Patient of Dr. Conchita Paris complains of increased nausea, double vision which he associates with shunt reprogramming-- Dr. Conchita Paris changed his valve setting to 0.5 3 days ago.  I reviewed his CT scan.  Compared with prior scan 14 days ago, he has decreased pericystic edema in the posterior fossa, slightly decreased ventricle size.  No signs of overdrainage on CT and he does not have classic positional headaches.  Patient requesting previous shunt adjustment be reversed.  I will defer to Dr. Conchita Paris on whether further shunt setting adjustment would be appropriate-- patient can f/u in his clinic for this.Marland Kitchen

## 2022-08-27 NOTE — ED Provider Triage Note (Cosign Needed)
Emergency Medicine Provider Triage Evaluation Note  Christopher Yu , a 62 y.o. male  was evaluated in triage.  Pt complains of not feeling well since discharge.  Recently had a intracranial shunt placed for hydrocephalus.  Complains of nausea, vomiting, change in vision.  Symptoms have been present since discharge.  They spoke to on-call neurosurgeon who stated they could either wait till tomorrow to be evaluated in clinic or come to the emergency department.  He does have history of CVA.Marland Kitchen  Review of Systems  Positive: As above Negative: As above  Physical Exam  BP 125/89   Pulse 94   Temp 98.9 F (37.2 C) (Oral)   Resp 18   Ht  (1.549 m)   Wt 88.1 kg   SpO2 95%   BMI 36.70 kg/m  Gen:   Awake, no distress   Resp:  Normal effort  MSK:   Moves extremities without difficulty  Other:  No facial droop.  Tongue midline.  Without pronator drift.  Good range of motion bilateral upper and lower extremities with 5/5 strength in extensor and flexor muscle groups.  Medical Decision Making  Medically screening exam initiated at 7:10 PM.  Appropriate orders placed.  Emmanual Josephlouis Pavlik was informed that the remainder of the evaluation will be completed by another provider, this initial triage assessment does not replace that evaluation, and the importance of remaining in the ED until their evaluation is complete.     Marita Kansas, PA-C 08/27/22 1911

## 2022-08-27 NOTE — ED Notes (Signed)
ED Provider at bedside. 

## 2022-08-27 NOTE — ED Provider Notes (Signed)
[place] EMERGENCY DEPARTMENT AT [facility] Provider Note   CSN: [id number] Arrival date & time: 08/27/22  1841     History {Add pertinent medical, surgical, social history, OB history to HPI:1} Chief Complaint  Patient presents with   Emesis    Nasri Casucci is a 62 y.o. male with recent cerebellar stroke and pseudomeningocele s/p craniotomy, rheumatoid arthritis on methotrexate, CAD, OSA on CPAP, chronic systolic CHF, cerebellar AVM resection 12/23 who presents with N/V, emesis..   Per chart review patient recently had a pseudomeningocele repaired on 07/18/2022, discharged home on 07/25/2022.  Was admitted from 07/23/2022 to 08/26/2022 for a cerebellar stroke, had worsening hydrocephalus and a VP shunt was placed on 4/5. Was able to ambulate with rolling walker, DC'd yesterday.  Today patient presents complains of not feeling well since discharge.  Complains of nausea, vomiting, change in vision. He states he feels like his vision is blurry on the left side, comes and goes, and when he opens his left eye he has double vision. He associates the start of these symptoms with his shunt reprogramming which increased the amount of fluid shunted from his cerebral space. Symptoms have been present since discharge.  They spoke to on-call neurosurgeon who stated they could either wait till tomorrow to be evaluated in clinic or come to the emergency department. Hasn't had any fevers, chills, CP, SOB, abd pain, urinary sxs, leg swelling. Hasn't had any positional headaches, no binocular visual changes. His speech is still abnormal, with some aphasia/dysarthria, but not worse than it was prior to ***.   Emesis      Home Medications Prior to Admission medications   Medication Sig Start Date End Date Taking? Authorizing Provider  acetaminophen (TYLENOL) 325 MG tablet Take 2 tablets (650 mg total) by mouth 3 (three) times daily. 08/08/22   Angiulli, Mcarthur Rossetti, PA-C  ALDACTONE 25  MG tablet Take 0.5 tablets (12.5 mg total) by mouth daily. 08/25/22 08/25/23  Angiulli, Mcarthur Rossetti, PA-C  artificial tears (LACRILUBE) OINT ophthalmic ointment Place into both eyes 2 (two) times daily. 08/08/22   Angiulli, Mcarthur Rossetti, PA-C  aspirin EC 81 MG tablet Take 1 tablet (81 mg total) by mouth daily. Swallow whole. 08/25/22   Angiulli, Mcarthur Rossetti, PA-C  calcium carbonate (TUMS - DOSED IN MG ELEMENTAL CALCIUM) 500 MG chewable tablet Chew 1 tablet (200 mg of elemental calcium total) by mouth daily with supper. 08/08/22   Angiulli, Mcarthur Rossetti, PA-C  Cholecalciferol (VITAMIN D3) 50 MCG (2000 UT) TABS Take 2,000 Units by mouth daily.    [provider]  diazepam (VALIUM) 5 MG tablet Take 1 tablet (5 mg total) by mouth every 8 (eight) hours as needed for muscle spasms. 07/25/22   Lisbeth Renshaw, MD  diclofenac Sodium (VOLTAREN) 1 % GEL Apply 2 g topically 4 (four) times daily. 08/08/22   Angiulli, Mcarthur Rossetti, PA-C  melatonin 5 MG TABS Take 1 tablet (5 mg total) by mouth at bedtime. 08/08/22   Angiulli, Mcarthur Rossetti, PA-C  methotrexate (RHEUMATREX) 2.5 MG tablet Take 20 mg by mouth once a week. Takes 8 tablets(20mg ) every Saturday.     Caution:Chemotherapy. Protect from light.    [provider]  Multiple Vitamins-Minerals (PRESERVISION AREDS 2 PO) Take 2 capsules by mouth daily.    [provider]  oxyCODONE (OXY IR/ROXICODONE) 5 MG immediate release tablet Take 1 tablet (5 mg total) by mouth 2 (two) times daily as needed for breakthrough pain. 08/25/22   Mariam Dollar  J, PA-C  pregabalin (LYRICA) 200 MG capsule Take 1 capsule (200 mg total) by mouth 2 (two) times daily. 08/25/22   Angiulli, Mcarthur Rossetti, PA-C  RITUXAN 500 MG/50ML injection See admin instructions. Every 6 months 2 dose infusion Ruxience Patient not taking: Reported on 04/22/2022 06/14/19   [provider]  rosuvastatin (CRESTOR) 20 MG tablet Take 1 tablet (20 mg total) by mouth daily. 02/24/22   Corky Crafts, MD   sertraline (ZOLOFT) 25 MG tablet Take 3 tablets (75 mg total) by mouth every morning. 08/25/22   Angiulli, Mcarthur Rossetti, PA-C  sulfaSALAzine (AZULFIDINE) 500 MG tablet Take 1,000 mg by mouth 2 (two) times daily.     [provider]      Allergies    Patient has no known allergies.    Review of Systems   Review of Systems  Gastrointestinal:  Positive for vomiting.   Review of systems Negative for f/c.  A 10 point review of systems was performed and is negative unless otherwise reported in HPI.  Physical Exam Updated Vital Signs BP 131/80 (BP Location: Left Arm)   Pulse 75   Temp 98.9 F (37.2 C) (Oral)   Resp 16   Ht 5\' 1"  (1.549 m)   Wt 88.1 kg   SpO2 99%   BMI 36.70 kg/m  Physical Exam General: Normal appearing {Desc; male/male:11659}, lying in bed.  HEENT: PERRLA, Sclera anicteric, MMM, trachea midline.  Cardiology: RRR, no murmurs/rubs/gallops. BL radial and DP pulses equal bilaterally.  Resp: Normal respiratory rate and effort. CTAB, no wheezes, rhonchi, crackles.  Abd: Soft, non-tender, non-distended. No rebound tenderness or guarding.  GU: Deferred. MSK: No peripheral edema or signs of trauma. Extremities without deformity or TTP. No cyanosis or clubbing. Skin: warm, dry. No rashes or lesions. Back: No CVA tenderness Neuro: A&Ox4, CNs II-XII grossly intact. MAEs. Sensation grossly intact.  Psych: Normal mood and affect.   ED Results / Procedures / Treatments   Labs (all labs ordered are listed, but only abnormal results are displayed) Labs Reviewed  CBC WITH DIFFERENTIAL/PLATELET  COMPREHENSIVE METABOLIC PANEL    EKG None  Radiology CT Head Wo Contrast  Result Date: 08/27/2022 CLINICAL DATA:  Hydrocephalus status post recent intracranial shunt placement, nausea and vomiting EXAM: CT HEAD WITHOUT CONTRAST TECHNIQUE: Contiguous axial images were obtained from the base of the skull through the vertex without intravenous contrast. RADIATION DOSE  REDUCTION: This exam was performed according to the departmental dose-optimization program which includes automated exposure control, adjustment of the mA and/or kV according to patient size and/or use of iterative reconstruction technique. COMPARISON:  08/13/2022 FINDINGS: Brain: No acute infarct or hemorrhage. Ventriculostomy catheter via a right frontal approach tip within the right lateral ventricle. No change in size of the lateral ventricles. No hydrocephalus. Prior suboccipital craniectomy with encephalomalacia within the cerebellum again noted. The pseudomeningocele seen within the suboccipital region on prior study has increased in size, now measuring 7.8 x 5.9 cm, previously having measured 7.4 x 5.0 cm. The right superior cerebellar cysts seen on prior study now measures 2.5 x 1.6 cm, previously 3.6 x 2.5 cm. Decreased mass effect upon the fourth ventricle. Midline structures are unremarkable. No acute extra-axial fluid collections. Vascular: No hyperdense vessel or unexpected calcification. Skull: Postsurgical changes from prior suboccipital craniectomy, with underlying encephalomalacia and pseudomeningocele as above. Postsurgical changes from ventriculostomy catheter in the right frontal calvarium. No acute fractures. Sinuses/Orbits: No acute finding. Other: None. IMPRESSION: 1. No evidence of acute infarct or hemorrhage.  2. Stable right-sided ventriculostomy catheter, tip within the right lateral ventricle. Stable size of the lateral ventricles without hydrocephalus. 3. Postsurgical changes from prior suboccipital craniectomy, with underlying encephalomalacia in the cerebellum. The pseudomeningocele seen on prior study has increased slightly in size since prior exam, of uncertain significance. 4. Decreased size of the right superior cerebellar intraparenchymal cyst, with decreased mass effect upon the fourth ventricle. Electronically Signed   By: Sharlet Salina M.D.   On: 08/27/2022 20:01     Procedures Procedures  {Document cardiac monitor, telemetry assessment procedure when appropriate:1}  Medications Ordered in ED Medications - No data to display  ED Course/ Medical Decision Making/ A&P                          Medical Decision Making Amount and/or Complexity of Data Reviewed Labs: ordered. Radiology:  Decision-making details documented in ED Course.  Risk Prescription drug management.    This patient presents to the ED for concern of ***, this involves an extensive number of treatment options, and is a complaint that carries with it a high risk of complications and morbidity.  I considered the following differential and admission for this acute, potentially life threatening condition.   MDM:    ***  Clinical Course as of 08/27/22 2023  Wed Aug 27, 2022  2022 CT Head Wo Contrast 1. No evidence of acute infarct or hemorrhage. 2. Stable right-sided ventriculostomy catheter, tip within the right lateral ventricle. Stable size of the lateral ventricles without hydrocephalus. 3. Postsurgical changes from prior suboccipital craniectomy, with underlying encephalomalacia in the cerebellum. The pseudomeningocele seen on prior study has increased slightly in size since prior exam, of uncertain significance. 4. Decreased size of the right superior cerebellar intraparenchymal cyst, with decreased mass effect upon the fourth ventricle.  [HN]    Clinical Course User Index [HN] Loetta Rough, MD    Labs: I Ordered, and personally interpreted labs.  The pertinent results include:  ***  Imaging Studies ordered: I ordered imaging studies including *** I independently visualized and interpreted imaging. I agree with the radiologist interpretation  Additional history obtained from ***.  External records from outside source obtained and reviewed including ***  Cardiac Monitoring: The patient was maintained on a cardiac monitor.  I personally viewed and interpreted  the cardiac monitored which showed an underlying rhythm of: ***  Reevaluation: After the interventions noted above, I reevaluated the patient and found that they have :{resolved/improved/worsened:23923::"improved"}  Social Determinants of Health: ***  Disposition:  ***  Co morbidities that complicate the patient evaluation  Past Medical History:  Diagnosis Date   Anxiety    Arthritis, rheumatoid    dx 1988   CAD (coronary artery disease), native coronary artery    Mild LAD disease with calcification noted at cath 12//27/16    Cardiomyopathy    Cardiomyopathy    Chronic systolic heart failure 05/01/2015   Depression    Deviated nasal septum 06/25/2011   Headache    High cholesterol    Hyperlipidemia    Impingement syndrome of left shoulder 12/22/2017   Macular degeneration    Rheumatoid aortitis    Sleep apnea    does not use cpap - UNABLE TO TOLERATE MASK   Sleep apnea in adult    deviated septum repaired, most recent sleep study was negative   Status post total right knee replacement 06/01/2015     Medicines No orders of the defined types were  placed in this encounter.   I have reviewed the patients home medicines and have made adjustments as needed  Problem List / ED Course: Problem List Items Addressed This Visit   None        {Document critical care time when appropriate:1} {Document review of labs and clinical decision tools ie heart score, Chads2Vasc2 etc:1}  {Document your independent review of radiology images, and any outside records:1} {Document your discussion with family members, caretakers, and with consultants:1} {Document social determinants of health affecting pt's care:1} {Document your decision making why or why not admission, treatments were needed:1}  This note was created using dictation software, which may contain spelling or grammatical errors.

## 2022-08-27 NOTE — ED Notes (Signed)
Called lab to add mag to existing blood

## 2022-08-27 NOTE — ED Triage Notes (Signed)
This pt was just discharged from re-hab yesterday  after a stroke and a shunt placed in his head.  The neuro person turned up his shunt yesterday and today he has had nausea and vomiintg and he just does not feel well    the pts wife is with him and she is concerned for another stroke ot the shunt is causing his problems

## 2022-08-27 NOTE — Discharge Instructions (Addendum)
Thank you for coming to [facility] Emergency Department. You were seen for visual changes, nausea/vomiting, slurred speech. We did an exam, labs, and imaging, and these showed no acute findings. We discussed with neurosurgery who felt that your CT head was improved from prior, however we cannot rule out a stroke. We recommended an MRI and admission, but you preferred to go home. You have accepted the risks of possible worsening of your condition up to and including death.   Please follow up with your neurosurgeon Dr. Conchita Paris as soon as possible, preferably tomorrow.   Do not hesitate to return to the ED or call 911 if you experience: -Worsening symptoms -Stroke-like symptoms -Severe headache -Worsening vision -Lightheadedness, passing out -Fevers/chills -Anything else that concerns you

## 2022-08-28 ENCOUNTER — Telehealth: Payer: Self-pay

## 2022-08-28 DIAGNOSIS — I639 Cerebral infarction, unspecified: Secondary | ICD-10-CM

## 2022-08-28 DIAGNOSIS — G4700 Insomnia, unspecified: Secondary | ICD-10-CM

## 2022-08-28 DIAGNOSIS — Z9889 Other specified postprocedural states: Secondary | ICD-10-CM

## 2022-08-28 DIAGNOSIS — R112 Nausea with vomiting, unspecified: Secondary | ICD-10-CM

## 2022-08-28 HISTORY — DX: Nausea with vomiting, unspecified: R11.2

## 2022-08-28 MED ORDER — TRAZODONE HCL 50 MG PO TABS: 50.0000 mg | ORAL_TABLET | Freq: Every evening | ORAL | 0 refills | Status: DC | PRN

## 2022-08-28 NOTE — Therapy (Signed)
OUTPATIENT SPEECH LANGUAGE PATHOLOGY EVALUATION   Patient Name: Christopher Yu MRN: [id number] DOB:Jul 18, 1960, 62 y.o., male Today's Date: 08/29/2022  PCP: Thana Ates, MD REFERRING PROVIDER: Charlton Amor, PA-C  END OF SESSION:  End of Session - 08/29/22 1138     Visit Number 1    Number of Visits 17    Date for SLP Re-Evaluation 10/24/22    Authorization Type BC[employer]    SLP Start Time 0930    SLP Stop Time  1015    SLP Time Calculation (min) 45 min    Activity Tolerance Patient tolerated treatment well   increased frsutration as session progressed            Past Medical History:  Diagnosis Date   Anxiety    Arthritis, rheumatoid (HCC)    dx 1988   CAD (coronary artery disease), native coronary artery    Mild LAD disease with calcification noted at cath 12//27/16    Cardiomyopathy (HCC)    Cardiomyopathy (HCC)    Chronic systolic heart failure (HCC) 05/01/2015   Depression    Deviated nasal septum 06/25/2011   Headache    High cholesterol    Hyperlipidemia    Impingement syndrome of left shoulder 12/22/2017   Macular degeneration    Nausea and vomiting 08/28/2022   Rheumatoid aortitis    Sleep apnea    does not use cpap - UNABLE TO TOLERATE MASK   Sleep apnea in adult    deviated septum repaired, most recent sleep study was negative   Status post total right knee replacement 06/01/2015   Past Surgical History:  Procedure Laterality Date   ANKLE FUSION Right 05/05/2012   related to his arthritis   ANKLE FUSION Left 05/25/2013   related to his arthritis   ANKLE SURGERY Bilateral    APPLICATION OF CRANIAL NAVIGATION N/A 04/11/2022   Procedure: APPLICATION OF CRANIAL NAVIGATION;  Surgeon: Lisbeth Renshaw, MD;  Location: [facility] OR;  Service: Neurosurgery;  Laterality: N/A;   APPLICATION OF CRANIAL NAVIGATION N/A 08/08/2022   Procedure: APPLICATION OF CRANIAL NAVIGATION;  Surgeon: Lisbeth Renshaw, MD;  Location: [facility] OR;  Service: Neurosurgery;   Laterality: N/A;   CARDIAC CATHETERIZATION N/A 05/01/2015   Procedure: Left Heart Cath and Coronary Angiography;  Surgeon: Lyn Records, MD;  Location: [facility] INVASIVE CV LAB;  Service: Cardiovascular;  Laterality: N/A;   CRANIOTOMY N/A 04/11/2022   Procedure: STEREOTACTIC SUBOCCIPITAL CRANIOTOMY FOR RESECTION OF ARTERIO-VENOUS MALFORMATION;  Surgeon: Lisbeth Renshaw, MD;  Location: [facility] OR;  Service: Neurosurgery;  Laterality: N/A;   FRACTURE SURGERY     left femur fracture x 3   IR ANGIO EXTERNAL CAROTID SEL EXT CAROTID BILAT MOD SED  01/28/2022   IR ANGIO INTRA EXTRACRAN SEL INTERNAL CAROTID BILAT MOD SED  01/28/2022   IR ANGIO INTRA EXTRACRAN SEL INTERNAL CAROTID BILAT MOD SED  04/16/2022   IR ANGIO VERTEBRAL SEL VERTEBRAL BILAT MOD SED  01/28/2022   IR ANGIO VERTEBRAL SEL VERTEBRAL BILAT MOD SED  04/16/2022   KNEE ARTHROSCOPY     right x 4   LAPAROSCOPIC REVISION VENTRICULAR-PERITONEAL (V-P) SHUNT N/A 08/08/2022   Procedure: LAPAROSCOPIC VENTRICULAR-PERITONEAL (V-P) SHUNT;  Surgeon: Harriette Bouillon, MD;  Location: [facility] OR;  Service: General;  Laterality: N/A;   LUMBAR LAMINECTOMY/DECOMPRESSION MICRODISCECTOMY N/A 07/18/2022   Procedure: Repair of Pseudomeningiocele posterior;  Surgeon: Lisbeth Renshaw, MD;  Location: [facility] OR;  Service: Neurosurgery;  Laterality: N/A;   NASAL SEPTOPLASTY W/ TURBINOPLASTY  06/25/2011   Procedure: NASAL SEPTOPLASTY  WITH TURBINATE REDUCTION;  Surgeon: Osborn Coho, MD;  Location: [facility] OR;  Service: ENT;  Laterality: Bilateral;   PLACEMENT OF LUMBAR DRAIN N/A 07/18/2022   Procedure: PLACEMENT OF LUMBAR DRAIN;  Surgeon: Lisbeth Renshaw, MD;  Location: [facility] OR;  Service: Neurosurgery;  Laterality: N/A;   TONSILLECTOMY     TOTAL KNEE ARTHROPLASTY Right 06/01/2015   Procedure: RIGHT TOTAL KNEE ARTHROPLASTY;  Surgeon: Kathryne Hitch, MD;  Location: [facility] ORS;  Service: Orthopedics;  Laterality: Right;  Block+general   VENTRICULOPERITONEAL SHUNT Right 08/08/2022    Procedure: LAP ASSITED SHUNT INSERTION VENTRICULAR-PERITONEAL;  Surgeon: Lisbeth Renshaw, MD;  Location: [facility] OR;  Service: Neurosurgery;  Laterality: Right;   Patient Active Problem List   Diagnosis Date Noted   Nausea and vomiting 08/28/2022   Adjustment disorder 08/06/2022   Protein-calorie malnutrition, severe 07/31/2022   Cerebellar stroke (HCC) 07/29/2022   Ischemic cerebrovascular accident (CVA) (HCC) 07/28/2022   Prolonged QT interval 07/28/2022   Pseudomeningocele 07/18/2022   Pseudomeningocele due to surgical procedure 07/18/2022   Status post craniotomy 04/11/2022   AVM (arteriovenous malformation) brain 04/11/2022   Impingement syndrome of left shoulder 12/22/2017   Impingement syndrome of right shoulder 12/22/2017   Rheumatoid arthritis involving right knee (HCC) 06/01/2015   Status post total right knee replacement 06/01/2015   Cardiomyopathy (HCC) 05/02/2015   Hyperlipidemia    Sleep apnea in adult    Rheumatoid arthritis (HCC)    Chronic systolic heart failure (HCC) 05/01/2015   CAD (coronary artery disease), native coronary artery     ONSET DATE: 07/28/2022   REFERRING DIAG: I63.9 (ICD-10-CM) - Cerebral infarction, unspecified   THERAPY DIAG:  Dysphagia, unspecified type  Dysarthria and anarthria  Rationale for Evaluation and Treatment: Rehabilitation  SUBJECTIVE:   SUBJECTIVE STATEMENT: "The way I sound I don't like it" Pt accompanied by: significant other  PERTINENT HISTORY: CT/MRI showed new small focus of acute infarction in the right superior cerebellar hemisphere. ST inpatient rehab focusing on cognition, dysphagia, and dysarthria.   PAIN:  Are you having pain? No  FALLS: Has patient fallen in last 6 months?  See PT evaluation for details  LIVING ENVIRONMENT: Lives with: lives with their family Lives in: House/apartment  PLOF:  Level of assistance: Needed assistance with IADLS Employment: Full-time employment  PATIENT GOALS: reduced  frustration with communication  OBJECTIVE:   COGNITION: Overall cognitive status: Within functional limits for tasks assessed   ORAL MOTOR EXAMINATION: Overall status: Impaired:   Facial: Left (Symmetry)   MOTOR SPEECH: assessed across variety of speech tasks: reading, word repetition, generative discourse sample Overall motor speech: impaired Level of impairment: Word Rate of Speech: Variable Dysfluencies: none evidenced Phonation: normal, though volume decay observed with extended utterances Conversational loudness average: 71 dB Voice Quality: normal Respiration: speaking on residual capacity Word and Phrasal Stress: reduced use of stress Resonance: WFL Articulation: Impaired: word -- substitutions, imprecise articulation Diadochokinetic Rate (DDK): slow rate and irregular rhythm Intelligibility: Intelligibility reduced; 70% Motor planning: Impaired: inconsistent Interfering components:  n/a Effective technique: slow rate, increased vocal intensity, over articulate, and pacing   RECOMMENDATIONS FROM OBJECTIVE SWALLOW STUDY (MBSS/FEES):  07/30/22 Objective swallow impairments: decreased laryngeal elevation, resulting in inadequate airway protection. Penetration with liquids and solids.  Objective recommended compensations: head turn to L with liquids, intermittent throat clear + re-swallow, no straws, small sips  CLINICAL SWALLOW ASSESSMENT:   Current diet: regular, thin liquids, and nectar thick liquids Dentition: adequate natural dentition Patient directly observed with POs: No Comments: reviewed MBSS  results and recommendations to inform POC  STANDARDIZED ASSESSMENTS: Deferred for motor speech eval  PATIENT REPORTED OUTCOME MEASURES (PROM): Communication Participation Item Bank: sent home for completion    TODAY'S TREATMENT:                                                                                                                                          08/29/22: updated HEP. Direct instruction for swallow exercises with pt able to confirm understanding via demonstration with min-A.    PATIENT EDUCATION: Education details: POC, goals, evaluation results/recommendations Person educated: Patient and Spouse Education method: Explanation, Demonstration, Verbal cues, and Handouts Education comprehension: verbalized understanding, returned demonstration, and needs further education   GOALS: Goals reviewed with patient? Yes  SHORT TERM GOALS: Target date: 10/03/2022  Pt will teach back dysarthria strategies and compensations with mod-I Baseline: Goal status: INITIAL  2.   Pt will employ dysarthria strategies during structured practice activities resulting in intelligible production at mult-sentence level in 80% of trials Baseline:  Goal status: INITIAL  3. Pt will demonstrate use of swallow compensations/strategies with PO trials with mod-I over 2 sessions  Baseline:  Goal status: INITIAL  4.  Pt will complete HEP 6/7 days over 1 week period with mod-I Baseline:  Goal status: INITIAL   LONG TERM GOALS: Target date: 10/24/2022  Pt will complete repeat MBSS Baseline:  Goal status: INITIAL  2.  Pt will carryover dysarthria strategies/compensations during 20 minute conversation resulting in 90% ineligibility  Baseline:  Goal status: INITIAL  3.  Pt and spouse will report subjective report of reduced need for requests for repetition by 50% at home, over 1 week period Baseline:  Goal status: INITIAL  4.  Pt will report improvement via PROM by dc  Baseline:  Goal status: INITIAL   ASSESSMENT:  CLINICAL IMPRESSION: Patient is a 62 y.o. M who was seen today for dysphagia and motor speech eval s/p cerebellar stroke. Evaluation reveals moderate dysarthria and recent MBSS demonstrates mild pharyngeal dysphagia. Pt's speech is c/b irregular imprecise articulation and distortions, volume decay on expanded utterances, and reduced use  of prosody. Pt reports frustration regarding his communicative abilities, tells SLP his vocation has high level of demands for communication and he feels he will "never get back there, it's impossible." As evaluation progressed, pt with increased frustration with SLP providing education to encourage participation and positive mindset for engagement in therapy course. Brief introduction to dysarthria strategies initiated. Reviewed instrumental swallow study results and updated pt's dysphagia HEP to accommodate preferences while targeting demonstrated deficits. Will plan to request repeat MBSS following usual completion of dysphagia HEP over 1-2 month period. Pt would benefit from skilled ST to address aforementioned deficits to improve QoL, enhance communication efficacy, and facilitate improved swallow function/safety .    OBJECTIVE IMPAIRMENTS: include dysarthria and dysphagia. These impairments are limiting patient from return to work, effectively communicating  at home and in community, and safety when swallowing. Factors affecting potential to achieve goals and functional outcome are cooperation/participation level. Patient will benefit from skilled SLP services to address above impairments and improve overall function.  REHAB POTENTIAL: Good  PLAN:  SLP FREQUENCY: 2x/week  SLP DURATION: 8 weeks  PLANNED INTERVENTIONS: Aspiration precaution training, Pharyngeal strengthening exercises, Diet toleration management , Trials of upgraded texture/liquids, Cueing hierachy, Internal/external aids, Functional tasks, Multimodal communication approach, SLP instruction and feedback, Compensatory strategies, Patient/family education, and Re-evaluation    Maia Breslow, CCC-SLP 08/29/2022, 11:39 AM

## 2022-08-28 NOTE — Telephone Encounter (Signed)
In conversation with wife in TC call, wife stated patient has been having nausea, vomiting, trouble sleeping, vision disturbance. She took him to the ED on 08/27/22 and a CT and bloodwork was performed.  The wife states he need a refill on Zofran prefers 8 mg because 4 mg is not helping. Also she said she will try Melatonin or do you recommend something else for sleep.

## 2022-08-28 NOTE — Telephone Encounter (Signed)
Transition Care Management Unsuccessful Follow-up Telephone Call  Date of discharge and from where:  08/26/22 [facility]  Attempts:  1st Attempt  Reason for unsuccessful TCM follow-up call:  Left voice message

## 2022-08-28 NOTE — Telephone Encounter (Signed)
Transitional Care call--Wife [name]    Are you/is patient experiencing any problems since coming home? Nausea and vomiting, vision disturbance, no appetite and no sleep. Went to ED on 08/27/22 CT scan and bloodwork done. Are there any questions regarding any aspect of care? No Are there any questions regarding medications administration/dosing? Did not get Pregabalin, need PA Are meds being taken as prescribed? Yes Patient should review meds with caller to confirm Have there been any falls? No Has Home Health been to the house and/or have they contacted you? [facility] out[facility] rehab If not, have you tried to contact them? Can we help you contact them? Are bowels and bladder emptying properly? Yes Are there any unexpected incontinence issues? No If applicable, is patient following bowel/bladder programs? Any fevers, problems with breathing, unexpected pain? No Are there any skin problems or new areas of breakdown? No Has the patient/family member arranged specialty MD follow up (ie cardiology/neurology/renal/surgical/etc)? Yes Can we help arrange? Does the patient need any other services or support that we can help arrange? No Are caregivers following through as expected in assisting the patient? Yes Has the patient quit smoking, drinking alcohol, or using drugs as recommended? Yes  Appointment time 10:20 am, arrive time 10:00 am with Riley Lam then Dr. Shearon Stalls [address] [phone]

## 2022-08-28 NOTE — Telephone Encounter (Signed)
Called her back and left VM; Patient has a prolonged Qtc so higher doses of zofran consistently for nausea are not ideal. They had a script of valium from 3/22; I advised them to try that and see if it helps. Most importantly, per his DC documentation in the ER, he needs to follow up outpatient with neurosurgery ASAP to get the shunt looked at and possibly re-adjusted. All this was relayed, they are to call me back through the office or message through MyChart.

## 2022-08-28 NOTE — Telephone Encounter (Signed)
Patient's spouse [name] r/t call. [phone]

## 2022-08-28 NOTE — Addendum Note (Signed)
Addended by: Elijah Birk on: 08/28/2022 06:35 PM   Modules accepted: Orders

## 2022-08-29 ENCOUNTER — Encounter: Payer: Self-pay | Admitting: Physical Therapy

## 2022-08-29 ENCOUNTER — Encounter: Payer: Self-pay | Admitting: Speech Pathology

## 2022-08-29 ENCOUNTER — Ambulatory Visit: Payer: BC Managed Care – PPO | Attending: Neurosurgery | Admitting: Speech Pathology

## 2022-08-29 ENCOUNTER — Ambulatory Visit: Payer: BC Managed Care – PPO | Admitting: Physical Therapy

## 2022-08-29 VITALS — BP 111/91 | HR 94

## 2022-08-29 DIAGNOSIS — R42 Dizziness and giddiness: Secondary | ICD-10-CM | POA: Diagnosis not present

## 2022-08-29 DIAGNOSIS — R2681 Unsteadiness on feet: Secondary | ICD-10-CM | POA: Diagnosis not present

## 2022-08-29 DIAGNOSIS — R41840 Attention and concentration deficit: Secondary | ICD-10-CM | POA: Diagnosis not present

## 2022-08-29 DIAGNOSIS — R262 Difficulty in walking, not elsewhere classified: Secondary | ICD-10-CM | POA: Diagnosis not present

## 2022-08-29 DIAGNOSIS — R471 Dysarthria and anarthria: Secondary | ICD-10-CM | POA: Insufficient documentation

## 2022-08-29 DIAGNOSIS — R41842 Visuospatial deficit: Secondary | ICD-10-CM | POA: Insufficient documentation

## 2022-08-29 DIAGNOSIS — R1310 Dysphagia, unspecified: Secondary | ICD-10-CM | POA: Diagnosis not present

## 2022-08-29 DIAGNOSIS — M6281 Muscle weakness (generalized): Secondary | ICD-10-CM | POA: Diagnosis not present

## 2022-08-29 DIAGNOSIS — R269 Unspecified abnormalities of gait and mobility: Secondary | ICD-10-CM | POA: Insufficient documentation

## 2022-08-29 DIAGNOSIS — R278 Other lack of coordination: Secondary | ICD-10-CM | POA: Insufficient documentation

## 2022-08-29 NOTE — Patient Instructions (Signed)
Complete 2 times a day: Lying head lift -- lift your head and hold for :30 seconds x3   OR  Chin tuck against resistance 10 pulses + :30 second hold x3 Tongue press (3 seconds) hard swallow (squeeze your throat muscles like you're swallowing peanut butter -- 25 reps  Take a drink of water every few swallows

## 2022-08-29 NOTE — Therapy (Signed)
OUTPATIENT PHYSICAL THERAPY NEURO EVALUATION   Patient Name: Christopher Yu MRN: [id number] DOB:1961-01-29, 62 y.o., male Today's Date: 08/29/2022   PCP: Thana Ates, MD REFERRING PROVIDER: Charlton Amor, PA-C  END OF SESSION:  PT End of Session - 08/29/22 0843     Visit Number 1    Number of Visits 17   including eval   Date for PT Re-Evaluation 11/07/22    Authorization Type [employer]    PT Start Time [phone]    PT Stop Time 0930    PT Time Calculation (min) 47 min    Equipment Utilized During Treatment Gait belt    Activity Tolerance Patient tolerated treatment well    Behavior During [facility] for tasks assessed/performed;Flat affect             Past Medical History:  Diagnosis Date   Anxiety    Arthritis, rheumatoid (HCC)    dx 1988   CAD (coronary artery disease), native coronary artery    Mild LAD disease with calcification noted at cath 12//27/16    Cardiomyopathy (HCC)    Cardiomyopathy (HCC)    Chronic systolic heart failure (HCC) 05/01/2015   Depression    Deviated nasal septum 06/25/2011   Headache    High cholesterol    Hyperlipidemia    Impingement syndrome of left shoulder 12/22/2017   Macular degeneration    Nausea and vomiting 08/28/2022   Rheumatoid aortitis    Sleep apnea    does not use cpap - UNABLE TO TOLERATE MASK   Sleep apnea in adult    deviated septum repaired, most recent sleep study was negative   Status post total right knee replacement 06/01/2015   Past Surgical History:  Procedure Laterality Date   ANKLE FUSION Right 05/05/2012   related to his arthritis   ANKLE FUSION Left 05/25/2013   related to his arthritis   ANKLE SURGERY Bilateral    APPLICATION OF CRANIAL NAVIGATION N/A 04/11/2022   Procedure: APPLICATION OF CRANIAL NAVIGATION;  Surgeon: Lisbeth Renshaw, MD;  Location: [facility] OR;  Service: Neurosurgery;  Laterality: N/A;   APPLICATION OF CRANIAL NAVIGATION N/A 08/08/2022   Procedure: APPLICATION OF  CRANIAL NAVIGATION;  Surgeon: Lisbeth Renshaw, MD;  Location: [facility] OR;  Service: Neurosurgery;  Laterality: N/A;   CARDIAC CATHETERIZATION N/A 05/01/2015   Procedure: Left Heart Cath and Coronary Angiography;  Surgeon: Lyn Records, MD;  Location: [facility] INVASIVE CV LAB;  Service: Cardiovascular;  Laterality: N/A;   CRANIOTOMY N/A 04/11/2022   Procedure: STEREOTACTIC SUBOCCIPITAL CRANIOTOMY FOR RESECTION OF ARTERIO-VENOUS MALFORMATION;  Surgeon: Lisbeth Renshaw, MD;  Location: [facility] OR;  Service: Neurosurgery;  Laterality: N/A;   FRACTURE SURGERY     left femur fracture x 3   IR ANGIO EXTERNAL CAROTID SEL EXT CAROTID BILAT MOD SED  01/28/2022   IR ANGIO INTRA EXTRACRAN SEL INTERNAL CAROTID BILAT MOD SED  01/28/2022   IR ANGIO INTRA EXTRACRAN SEL INTERNAL CAROTID BILAT MOD SED  04/16/2022   IR ANGIO VERTEBRAL SEL VERTEBRAL BILAT MOD SED  01/28/2022   IR ANGIO VERTEBRAL SEL VERTEBRAL BILAT MOD SED  04/16/2022   KNEE ARTHROSCOPY     right x 4   LAPAROSCOPIC REVISION VENTRICULAR-PERITONEAL (V-P) SHUNT N/A 08/08/2022   Procedure: LAPAROSCOPIC VENTRICULAR-PERITONEAL (V-P) SHUNT;  Surgeon: Harriette Bouillon, MD;  Location: [facility] OR;  Service: General;  Laterality: N/A;   LUMBAR LAMINECTOMY/DECOMPRESSION MICRODISCECTOMY N/A 07/18/2022   Procedure: Repair of Pseudomeningiocele posterior;  Surgeon: Lisbeth Renshaw, MD;  Location: [facility] OR;  Service: Neurosurgery;  Laterality: N/A;   NASAL SEPTOPLASTY W/ TURBINOPLASTY  06/25/2011   Procedure: NASAL SEPTOPLASTY WITH TURBINATE REDUCTION;  Surgeon: Osborn Coho, MD;  Location: [facility] OR;  Service: ENT;  Laterality: Bilateral;   PLACEMENT OF LUMBAR DRAIN N/A 07/18/2022   Procedure: PLACEMENT OF LUMBAR DRAIN;  Surgeon: Lisbeth Renshaw, MD;  Location: [facility] OR;  Service: Neurosurgery;  Laterality: N/A;   TONSILLECTOMY     TOTAL KNEE ARTHROPLASTY Right 06/01/2015   Procedure: RIGHT TOTAL KNEE ARTHROPLASTY;  Surgeon: Kathryne Hitch, MD;  Location: [facility] ORS;  Service:  Orthopedics;  Laterality: Right;  Block+general   VENTRICULOPERITONEAL SHUNT Right 08/08/2022   Procedure: LAP ASSITED SHUNT INSERTION VENTRICULAR-PERITONEAL;  Surgeon: Lisbeth Renshaw, MD;  Location: [facility] OR;  Service: Neurosurgery;  Laterality: Right;   Patient Active Problem List   Diagnosis Date Noted   Nausea and vomiting 08/28/2022   Adjustment disorder 08/06/2022   Protein-calorie malnutrition, severe 07/31/2022   Cerebellar stroke (HCC) 07/29/2022   Ischemic cerebrovascular accident (CVA) (HCC) 07/28/2022   Prolonged QT interval 07/28/2022   Pseudomeningocele 07/18/2022   Pseudomeningocele due to surgical procedure 07/18/2022   Status post craniotomy 04/11/2022   AVM (arteriovenous malformation) brain 04/11/2022   Impingement syndrome of left shoulder 12/22/2017   Impingement syndrome of right shoulder 12/22/2017   Rheumatoid arthritis involving right knee (HCC) 06/01/2015   Status post total right knee replacement 06/01/2015   Cardiomyopathy (HCC) 05/02/2015   Hyperlipidemia    Sleep apnea in adult    Rheumatoid arthritis (HCC)    Chronic systolic heart failure (HCC) 05/01/2015   CAD (coronary artery disease), native coronary artery     ONSET DATE: 08/21/2022 (referral date)  REFERRING DIAG: I63.9 (ICD-10-CM) - Cerebral infarction, unspecified  THERAPY DIAG:  Abnormality of gait and mobility - Plan: PT plan of care cert/re-cert  Unsteadiness on feet - Plan: PT plan of care cert/re-cert  Other lack of coordination - Plan: PT plan of care cert/re-cert  Dizziness and giddiness - Plan: PT plan of care cert/re-cert  Rationale for Evaluation and Treatment: Rehabilitation  SUBJECTIVE:                                                                                                                                                                                             SUBJECTIVE STATEMENT: Patient arrives to session in manual wheelchair with wife carrying 2WW. Patient  wife primarily speaks during the session given patient's dysarthria. Per caregiver, patient had a surgery to address AVM and then had pseudomengiocele and underwent 3 surgeries as a result of complications. Per report, patient was D/C from hospital and then returned  with symptoms of acute stroke. Prior to December patient was fully independent, working full time in IT, and golfing regularly every weekend; however, patient is now fully supervised for all ADLs, not working, and now mobilizing either via manual W/C or walker. Patient reports increased nausea disrupting appetite. Family reports going to the ED on Wednesday due to new reports of nausea and double vision and there was concern for another stroke; however, family decided to the return home when CT came back clear and did not want to go through additional MRI. Per family, inpatient rehab physician aware of this and recommended they call their neurosurgeon for follow up as well and did not advise against continuing outpatient PT today. Family also expresses concern in not knowing how to truly recognize a stroke as patient now has such extensive deficits. Family also reports that a therapist in inpatient rehab noticed unique eye movements and recommend they also see a therapist that could help address vestibular concerns.   Pt accompanied by: self and significant other - wife Nayib Remer  PERTINENT HISTORY: cerebellar AVM resection 12/23, pseudomeningocele s/p craniotomy 3/24, acute R cerebellar infarct, hydrocephalus and VP shunt was placed on 08/08/22, dysphagia, dysarthria, aphasia  PMH: rheumatoid arthritis, bilateral ankle fusion (2014 and 2015), cardiomyopathy, CAD, R TKA, cardiomyopathy, chronic systolic heart failure, macular degeneration, OSA on CPAP  PAIN:  Are you having pain? No  PRECAUTIONS: Fall and Other: swallow precautions with modification with left head tilt and supervision if drinking thinned liquids, per caregiver not to lift,  history of orthostatic hypotension resulting in falls  WEIGHT BEARING RESTRICTIONS: No  FALLS: Has patient fallen in last 6 months? Yes. Number of falls 3 - after the first surgery, got dizzy and fell, using the walker, when up and moving, struggled with orthostatic hypotension but medical team has since decreased BP medications  LIVING ENVIRONMENT: Lives with: lives with their family Lives in: House/apartment Stairs: Yes: Internal: ~14 steps; not specified and External: 3 steps; on right going up - Patient able to live on first floor Has following equipment at home: Dan Humphreys - 2 wheeled and Wheelchair (manual)  PLOF: Independent - full time working, playing golf regularly  PATIENT GOALS: "To get back to playing golf and moving like I used to."  OBJECTIVE:   DIAGNOSTIC FINDINGS:   CT on 08/27/2022: IMPRESSION: 1. No evidence of acute infarct or hemorrhage. 2. Stable right-sided ventriculostomy catheter, tip within the right lateral ventricle. Stable size of the lateral ventricles without hydrocephalus. 3. Postsurgical changes from prior suboccipital craniectomy, with underlying encephalomalacia in the cerebellum. The pseudomeningocele seen on prior study has increased slightly in size since prior exam, of uncertain significance. 4. Decreased size of the right superior cerebellar intraparenchymal cyst, with decreased mass effect upon the fourth ventricle.  MR Brain on 07/28/2022: IMPRESSION: 1. New small focus of acute infarction in the right superior cerebellar hemisphere with surrounding area of increased T2 signal and cystic change, which is new since the prior MRI dated 07/05/2022 and suspicious for now chronic infarct. 2. Postoperative changes of prior left posterior fossa resection. Redemonstrated foci of gas within the resection cavity, fourth ventricle and extra-axial spaces, likely secondary to recent pseudomeningocele repair and/or lumbar drain placement/removal. 3.  Decreased size of the fluid collection in the dorsal soft tissues overlying the suboccipital craniectomy.  COGNITION: Overall cognitive status: Impaired   SENSATION: WFL  COORDINATION: Slight dysmetria noted with heel to shin on both left and right leg.   EDEMA:  Mild LE  swelling in feet  TONE:  Appears to have mild increased hypertonicity bilaterally   POSTURE: rounded shoulders and forward head  LOWER EXTREMITY ROM:     Given ankle fusion, lacks ankle mobility, mild tightness in throughout LE but functional with ADLs  LOWER EXTREMITY MMT:    Not formally assessed given dysmetric movements/tone impacting accurate grading, mild functional weakness noted but no major difference noted side to side  BED MOBILITY:  Patient and caregiver report no difficulty in this area  TRANSFERS: Assistive device utilized: Environmental consultant - 2 wheeled  Sit to stand: SBA and CGA  Stand to sit: SBA and CGA Chair to chair: SBA and CGA Patient very quick when getting up; unclear if due to true impulsivity but required education on pacing for safety.   GAIT: Gait pattern:  ataxic, step through pattern, decreased hip/knee flexion- Right, decreased hip/knee flexion- Left, scissoring, trunk flexed, poor foot clearance- Right, and poor foot clearance- Left Distance walked: 4 x 60 feet Assistive device utilized: Walker - 2 wheeled and wheelchair follow Level of assistance: CGA Comments: Mild scissoring noted with tendency of right LE to cross LLE with signs of mild ataxia  FUNCTIONAL TESTS:    [facility] PT Assessment - 08/29/22 0001       Standardized Balance Assessment   Standardized Balance Assessment Timed Up and Go Test;10 meter walk test;Five Times Sit to Stand    Five times sit to stand comments  22.45   sec without UE use (WBOS, moderate trunk sway when comes to stand and requires mix of CGA-SBA)   10 Meter Walk 0.77 m/s   with RW (CGA-SBA)     Timed Up and Go Test   Normal TUG (seconds) 21.27    with RW (CGA)           PATIENT SURVEYS:  FOTO 49   TODAY'S TREATMENT:                                                                                                                               Initial Eval only  PATIENT EDUCATION: Education details: POC, examination findings, results, collaboration on goals, Brief education provided on how to recognize signs of Stroke and printed BE FAST pdf and explained that one would be looking for these symptoms as changes from baseline Person educated: Patient and Spouse Education method: Explanation and Handouts Education comprehension: verbalized understanding and needs further education  HOME EXERCISE PROGRAM: To be provided  GOALS: Goals reviewed with patient? Yes  SHORT TERM GOALS: Target date: 09/26/2022  Patient will demonstrate 100% compliance with initial HEP to continue to progress between physical therapy sessions.   Baseline: To be provided Goal status: INITIAL  2.  Patient will improve their 5x Sit to Stand score to less than 18 seconds to demonstrate a decreased risk for falls and improved LE strength.   Baseline: 22.45" without UE support/unsteady WBOS Goal status: INITIAL  3.  Patient will improve  TUG score to 17 seconds or less with LRAD to indicate a decreased risk of falls and demonstrate improved overall mobility.  Baseline: 21.27" with RW Goal status: INITIAL  4.  Patient will improve gait speed to 0.87 m/s to indicate improvement to the level of community ambulator in order to participate more easily in activities outside of the home such as adapted golf.   Baseline: 0.77 m/s Goal status: INITIAL  5.  Berg to be assessed / goal written as indicated Baseline: To be assessed Goal status: INITIAL  LONG TERM GOALS: Target date: 10/24/2022  Patient will report demonstrate independence with final HEP in order to maintain current gains and continue to progress after physical therapy discharge.   Baseline: To  be provided Goal status: INITIAL  2.   Patient will improve their 5x Sit to Stand score to 15 seconds to demonstrate a decreased risk for falls and improved LE strength.   Baseline: 22.45" without UE support/unsteady WBOS Goal status: INITIAL  3.  Patient will improve TUG score to 15 seconds or less with LRAD to indicate a decreased risk of falls and demonstrate improved overall mobility.  Baseline: 21.27" with RW Goal status: INITIAL  4.  Patient will improve gait speed to 1.0 m/s to indicate improvement to the level of community ambulator in order to participate more easily in activities outside of the home such as adapted golf.   Baseline: 0.77 m/s Goal status: INITIAL  5.  Patient will improve FOTO score to 57 to achieve predicted improvements in functional mobility due to skilled physical therapy interventions to increase safety with and participation in daily activities. Baseline: 49 Goal status: INITIAL  6.  Berg to be assessed / goal written as indicated Baseline: To be assessed / goal written as indicated  Goal status: INITIAL  ASSESSMENT:  CLINICAL IMPRESSION: Patient is a 62 y.o. male who was seen today for physical therapy evaluation and treatment for abnormalities of gait and mobility following a series of complex medical events starting in December '23, resulting in cerebellar AVM resection 12/23, pseudomeningocele s/p craniotomy 3/24, acute R cerebellar infarct, hydrocephalus and VP shunt was placed on 4/24. PMH also includes bilateral ankle fusion, cardiomyopathy, CAD, RAD, cardiomyopathy, chronic systolic heart failure, and macular degeneration. Patient presents with mild ataxic gait and decreased activity tolerance as noted by need to take multiple breaks throughout session. Patient is at an increased risk for falls as indicated by gait speed, TUG, and gait speed. Patient is currently at the level of limited community ambulator and demonstrates reduced functional strength  given 5xSTS results. Patient is far from baseline function of full independence and regular golfing in December and is hoping to progress towards PLOF. Patient will benefit from physical therapy services in order to address impairments in order to maximize function and progress towards patient's goals of increasing activity and independence.  OBJECTIVE IMPAIRMENTS: Abnormal gait, cardiopulmonary status limiting activity, decreased activity tolerance, decreased balance, decreased cognition, decreased coordination, decreased endurance, decreased knowledge of condition, decreased mobility, difficulty walking, decreased ROM, decreased strength, and impaired tone.   ACTIVITY LIMITATIONS: carrying, lifting, bending, sitting, standing, squatting, stairs, transfers, reach over head, locomotion level, and caring for others  PARTICIPATION LIMITATIONS: cleaning, driving, shopping, community activity, occupation, and yard work, Systems analyst  PERSONAL FACTORS: Past/current experiences and 3+ comorbidities: see above  are also affecting patient's functional outcome, reliance on others for transportation   REHAB POTENTIAL: Good - may be limited by complex medical history  CLINICAL DECISION MAKING:  Evolving/moderate complexity  EVALUATION COMPLEXITY: Moderate  PLAN:  PT FREQUENCY: 2x/week  PT DURATION: 8 weeks  PLANNED INTERVENTIONS: Therapeutic exercises, Therapeutic activity, Neuromuscular re-education, Balance training, Gait training, Patient/Family education, Self Care, Joint mobilization, Stair training, Vestibular training, DME instructions, Wheelchair mobility training, Manual therapy, and Re-evaluation  PLAN FOR NEXT SESSION: monitor vitals/orthostatic hypotension, screen for central versus peripheral causes of dizziness, assess Berg/write goal as indicated, high intensity gait training as tolerated, balance, integrate golfing concepts as able  Carmelia Bake, PT, DPT 08/29/2022, 4:22 PM

## 2022-09-02 ENCOUNTER — Other Ambulatory Visit: Payer: Self-pay

## 2022-09-02 ENCOUNTER — Ambulatory Visit: Payer: BC Managed Care – PPO

## 2022-09-02 DIAGNOSIS — R41840 Attention and concentration deficit: Secondary | ICD-10-CM | POA: Diagnosis not present

## 2022-09-02 DIAGNOSIS — R41842 Visuospatial deficit: Secondary | ICD-10-CM

## 2022-09-02 DIAGNOSIS — R278 Other lack of coordination: Secondary | ICD-10-CM | POA: Diagnosis not present

## 2022-09-02 DIAGNOSIS — R42 Dizziness and giddiness: Secondary | ICD-10-CM | POA: Diagnosis not present

## 2022-09-02 DIAGNOSIS — R1310 Dysphagia, unspecified: Secondary | ICD-10-CM | POA: Diagnosis not present

## 2022-09-02 DIAGNOSIS — R262 Difficulty in walking, not elsewhere classified: Secondary | ICD-10-CM

## 2022-09-02 DIAGNOSIS — M6281 Muscle weakness (generalized): Secondary | ICD-10-CM

## 2022-09-02 DIAGNOSIS — R471 Dysarthria and anarthria: Secondary | ICD-10-CM | POA: Diagnosis not present

## 2022-09-02 DIAGNOSIS — R2681 Unsteadiness on feet: Secondary | ICD-10-CM

## 2022-09-02 DIAGNOSIS — R269 Unspecified abnormalities of gait and mobility: Secondary | ICD-10-CM | POA: Diagnosis not present

## 2022-09-02 NOTE — Patient Instructions (Signed)
  Coordination Activities  Perform the following activities for 10 minutes 1 time per day with left hand(s).  Rotate ball in fingertips (clockwise and counter-clockwise). Toss ball in air and catch with the same hand. Flip cards 1 at a time as fast as you can. Shuffle cards. Pick up coins, buttons, marbles, dried beans/pasta of different sizes and place in container. Pick up coins one at a time until you get 5-10 in your hand, then move coins from palm to fingertips to stack one at a time. Twirl pen between fingers. Practice writing and/or typing.

## 2022-09-02 NOTE — Therapy (Signed)
OUTPATIENT OCCUPATIONAL THERAPY NEURO EVALUATION  Patient Name: Christopher Yu MRN: [id number] DOB:[birth year], 61 y.o., male Today's Date: 09/02/2022  PCP: Christopher Ates, MD REFERRING PROVIDER: Charlton Amor, PA-C  END OF SESSION:  OT End of Session - 09/02/22 0827     Visit Number 1    Number of Visits 20    Authorization Type [employer]    OT Start Time [phone]    OT Stop Time 0930    OT Time Calculation (min) 64 min    Activity Tolerance Patient tolerated treatment well    Behavior During Therapy [facility] for tasks assessed/performed;Flat affect             Past Medical History:  Diagnosis Date   Anxiety    Arthritis, rheumatoid (HCC)    dx 1988   CAD (coronary artery disease), native coronary artery    Mild LAD disease with calcification noted at cath 12//27/16    Cardiomyopathy (HCC)    Cardiomyopathy (HCC)    Chronic systolic heart failure (HCC) 05/01/2015   Depression    Deviated nasal septum 06/25/2011   Headache    High cholesterol    Hyperlipidemia    Impingement syndrome of left shoulder 12/22/2017   Macular degeneration    Nausea and vomiting 08/28/2022   Rheumatoid aortitis    Sleep apnea    does not use cpap - UNABLE TO TOLERATE MASK   Sleep apnea in adult    deviated septum repaired, most recent sleep study was negative   Status post total right knee replacement 06/01/2015   Past Surgical History:  Procedure Laterality Date   ANKLE FUSION Right 05/05/2012   related to his arthritis   ANKLE FUSION Left 05/25/2013   related to his arthritis   ANKLE SURGERY Bilateral    APPLICATION OF CRANIAL NAVIGATION N/A 04/11/2022   Procedure: APPLICATION OF CRANIAL NAVIGATION;  Surgeon: Christopher Renshaw, MD;  Location: [facility] OR;  Service: Neurosurgery;  Laterality: N/A;   APPLICATION OF CRANIAL NAVIGATION N/A 08/08/2022   Procedure: APPLICATION OF CRANIAL NAVIGATION;  Surgeon: Christopher Renshaw, MD;  Location: [facility] OR;  Service: Neurosurgery;  Laterality:  N/A;   CARDIAC CATHETERIZATION N/A 05/01/2015   Procedure: Left Heart Cath and Coronary Angiography;  Surgeon: Christopher Records, MD;  Location: [facility] INVASIVE CV LAB;  Service: Cardiovascular;  Laterality: N/A;   CRANIOTOMY N/A 04/11/2022   Procedure: STEREOTACTIC SUBOCCIPITAL CRANIOTOMY FOR RESECTION OF ARTERIO-VENOUS MALFORMATION;  Surgeon: Christopher Renshaw, MD;  Location: [facility] OR;  Service: Neurosurgery;  Laterality: N/A;   FRACTURE SURGERY     left femur fracture x 3   IR ANGIO EXTERNAL CAROTID SEL EXT CAROTID BILAT MOD SED  01/28/2022   IR ANGIO INTRA EXTRACRAN SEL INTERNAL CAROTID BILAT MOD SED  01/28/2022   IR ANGIO INTRA EXTRACRAN SEL INTERNAL CAROTID BILAT MOD SED  04/16/2022   IR ANGIO VERTEBRAL SEL VERTEBRAL BILAT MOD SED  01/28/2022   IR ANGIO VERTEBRAL SEL VERTEBRAL BILAT MOD SED  04/16/2022   KNEE ARTHROSCOPY     right x 4   LAPAROSCOPIC REVISION VENTRICULAR-PERITONEAL (V-P) SHUNT N/A 08/08/2022   Procedure: LAPAROSCOPIC VENTRICULAR-PERITONEAL (V-P) SHUNT;  Surgeon: Christopher Bouillon, MD;  Location: [facility] OR;  Service: General;  Laterality: N/A;   LUMBAR LAMINECTOMY/DECOMPRESSION MICRODISCECTOMY N/A 07/18/2022   Procedure: Repair of Pseudomeningiocele posterior;  Surgeon: Christopher Renshaw, MD;  Location: [facility] OR;  Service: Neurosurgery;  Laterality: N/A;   NASAL SEPTOPLASTY W/ TURBINOPLASTY  06/25/2011   Procedure: NASAL SEPTOPLASTY WITH TURBINATE REDUCTION;  Surgeon: Christopher Coho, MD;  Location: [facility] OR;  Service: ENT;  Laterality: Bilateral;   PLACEMENT OF LUMBAR DRAIN N/A 07/18/2022   Procedure: PLACEMENT OF LUMBAR DRAIN;  Surgeon: Christopher Renshaw, MD;  Location: [facility] OR;  Service: Neurosurgery;  Laterality: N/A;   TONSILLECTOMY     TOTAL KNEE ARTHROPLASTY Right 06/01/2015   Procedure: RIGHT TOTAL KNEE ARTHROPLASTY;  Surgeon: Christopher Hitch, MD;  Location: [facility] ORS;  Service: Orthopedics;  Laterality: Right;  Block+general   VENTRICULOPERITONEAL SHUNT Right 08/08/2022   Procedure:  LAP ASSITED SHUNT INSERTION VENTRICULAR-PERITONEAL;  Surgeon: Christopher Renshaw, MD;  Location: [facility] OR;  Service: Neurosurgery;  Laterality: Right;   Patient Active Problem List   Diagnosis Date Noted   Nausea and vomiting 08/28/2022   Adjustment disorder 08/06/2022   Protein-calorie malnutrition, severe 07/31/2022   Cerebellar stroke (HCC) 07/29/2022   Ischemic cerebrovascular accident (CVA) (HCC) 07/28/2022   Prolonged QT interval 07/28/2022   Pseudomeningocele 07/18/2022   Pseudomeningocele due to surgical procedure 07/18/2022   Status post craniotomy 04/11/2022   AVM (arteriovenous malformation) brain 04/11/2022   Impingement syndrome of left shoulder 12/22/2017   Impingement syndrome of right shoulder 12/22/2017   Rheumatoid arthritis involving right knee (HCC) 06/01/2015   Status post total right knee replacement 06/01/2015   Cardiomyopathy (HCC) 05/02/2015   Hyperlipidemia    Sleep apnea in adult    Rheumatoid arthritis (HCC)    Chronic systolic heart failure (HCC) 05/01/2015   CAD (coronary artery disease), native coronary artery     ONSET DATE: 08/21/2022 (date of referral)  REFERRING DIAG: I63.9 (ICD-10-CM) - Cerebral infarction, unspecified  THERAPY DIAG:  Other lack of coordination  Muscle weakness (generalized)  Visuospatial deficit  Attention and concentration deficit  Unsteadiness on feet  Rationale for Evaluation and Treatment: Rehabilitation  SUBJECTIVE:   SUBJECTIVE STATEMENT: Pt's wife reports he has quite a bit of fatigue and mostly sedentary currently. Pt also experiencing nausea frequently, and MD to prescribe anti-nausea medicine. This is impacting appetite as well.  Pt accompanied by: self and significant other, Christopher Yu  PERTINENT HISTORY: CT/MRI showed new small focus of acute infarction in the right superior cerebellar hemisphere. Cerebellar AVM resection 12/23, pseudomeningocele s/p craniotomy 3/24, acute R cerebellar infarct, hydrocephalus and  VP shunt was placed on 08/08/22, dysphagia, dysarthria, aphasia. PMH: rheumatoid arthritis, bilateral ankle fusion (2014 and 2015), cardiomyopathy, CAD, R TKA, cardiomyopathy, chronic systolic heart failure, macular degeneration, OSA on CPAP  PRECAUTIONS: Fall and Other: swallow precautions with modification with left head tilt and supervision if drinking thinned liquids, per caregiver not to lift, history of orthostatic hypotension resulting in falls; no driving  WEIGHT BEARING RESTRICTIONS: No  PAIN:  Are you having pain? No, wife reports taking oxycodone this morning. Pt reports sometimes having pain in his head.   FALLS: Has patient fallen in last 6 months? Yes. Number of falls 3 - after the first surgery, got dizzy and fell, using the walker, when up and moving, struggled with orthostatic hypotension but medical team has since decreased BP medications  LIVING ENVIRONMENT: Lives with: lives with their spouse, two teenage daughters (also has dtr who lives in [place]), and 175 lb dog Lives in: House/apartment - Able to live on main floor Stairs: Yes: Internal: ~14 steps; on right going up and External: 3 steps; on right going up Has following equipment at home: Dan Humphreys - 2 wheeled and Wheelchair (manual) Bathroom setup: [employer], regular height toilet  PLOF: Prior to December, pt completely independent with ADL/IADLs, ambulatory  without device, pt enjoys golfing, driving, 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  PATIENT GOALS: Return to golf, improve vision for work, travel in June for daughter's outdoor wedding (be off the walker ideally)  OBJECTIVE:   HAND DOMINANCE: Right  ADLs: Eating: Independent with self-feeding, wife cuts items due to impaired coordination per her report Grooming: Independent mostly, but reports it's painful to brush hair from surgical procedure in December UB Dressing: Supervision, increased time and wife present if needed LB Dressing: Supervision increased time  and wife present if needed. Sometimes sits to don pants Toileting: Supervision, more effort, wife there in case pt loses balance Bathing: Supervision for balance Tub Shower transfers: Supervision likely; however, sponge bath currently due to hand held shower head breaking and plumber coming 5/1.  Equipment:  Toilet riser with armrests , shower chair, bedrail on bed  IADLs: Shopping: Currently unable, previously independent Light housekeeping: Currently unable, previously did laundry Meal Prep: Currently unable, but previously assisted in task Community mobility: and household, pt utilizing rolling walker at all times. Typically sedentary in recliner most of the day. Gets up for bathroom.  Medication management: Wife Presenter, broadcasting: Currently unable, pt typically did before Handwriting: 75% legible and Mild micrographia   MOBILITY STATUS:  Pt ambulatory with RW, wife providing supervision  at all times  POSTURE COMMENTS:  rounded shoulders Sitting balance: Moves/returns truncal midpoint 1-2 inches in multiple planes - Wife describes she believes it is more of a coordination issue, overshooting and then losing balance.   ACTIVITY TOLERANCE: Activity tolerance: Subjective report of significant fatigue  FUNCTIONAL OUTCOME MEASURES: FOTO: 58 score  UPPER EXTREMITY ROM:    Active ROM Right eval Left eval  Shoulder flexion [facility]  Shoulder abduction [facility]  Shoulder adduction [facility] WFL  Shoulder extension [facility]  Shoulder internal rotation    Shoulder external rotation    Elbow flexion WFL WFL  Elbow extension [facility] WFL  Wrist flexion    Wrist extension    Wrist ulnar deviation    Wrist radial deviation    Wrist pronation    Wrist supination    (Blank rows = not tested)  UPPER EXTREMITY MMT:     MMT Right eval Left eval  Shoulder flexion 4+/5 4+/5  Shoulder abduction    Shoulder adduction    Shoulder extension    Shoulder internal rotation    Shoulder  external rotation    Middle trapezius    Lower trapezius    Elbow flexion 4+/5 4+/5  Elbow extension 4+/5 4+/5  Wrist flexion    Wrist extension    Wrist ulnar deviation    Wrist radial deviation    Wrist pronation    Wrist supination    (Blank rows = not tested)  HAND FUNCTION: Grip strength: Right: 67.2 lbs; Left: 66.3 lbs and 3 point pinch: Right: 12 lbs, Left: 15 lbs  COORDINATION: 9 Hole Peg test: Right: 31 sec; Left: 59 sec Box and Blocks:  Right 27 blocks, Left 19blocks  SENSATION: WFL  EDEMA: None  MUSCLE TONE: WFL  COGNITION: Overall cognitive status:  Grossly WFL, possible for attention deficits.  VISION: Subjective report: Pt reports some jerky vision since December Baseline vision: Wears glasses all the time Visual history: cataracts and macular degeneration  VISION ASSESSMENT: Impaired Eye alignment: Impaired: Right eye seemed to have amblyopia-type appearance with visual pursuits  Reading acuity: Pt reports difficulty reading instructions at this time, he reports all letters merge together Tracking/Visual pursuits: Right  eye appears to be slower than left Saccades: overshoots - right eye from temporal to nasal Convergence: WFL Visual Fields: Grossly intact; however, decreased peripheral vision to right side when provided with moving target with detection of object at 30* Diplopia assessment: When given macular degeneration Amsler grid, pt reports double vision of right eye when left eye occluded. Quadrants - WFL   Patient has difficulty with following activities due to following visual impairments: Work related tasks  PERCEPTION: WFL  PRAXIS: WFL  OBSERVATIONS: Patient arrives to session ambulating with rolling walker. Patient wife primarily speaks during the session given patient's dysarthria. Per caregiver, patient had a surgery to address AVM and then had pseudomengiocele and underwent 3 surgeries as a result of complications. Per report, patient  was D/C from hospital and then returned with symptoms of acute stroke. Prior to December patient was fully independent, working full time in IT, and golfing regularly every weekend; however, patient is now fully supervised for all ADLs, not working, and now mobilizing either via manual W/C or walker. Patient reports increased nausea disrupting appetite. Family reports going to the ED on last Wednesday due to new reports of nausea and double vision and there was concern for another stroke; however, family decided to the return home when CT came back clear and did not want to go through additional MRI. Per family, inpatient rehab physician aware of this and recommended they call their neurosurgeon for follow up as well and did not advise against continuing outpatient therapy. Family also expresses concern in not knowing how to truly recognize a stroke as patient now has such extensive deficits. Family also reports that a therapist in inpatient rehab noticed unique eye movements and recommend they also see a therapist that could help address vestibular concerns. Wife showing OT video today. Video showed pt performing visual pursuits with jerky eye movement going from left to right sides, right eye impacted.   TODAY'S TREATMENT:                                                                                                                              DATE:  Pt provided coordination HEP this visit. Please refer to pt instructions. Pt requiring verbal and visual cues for technique for neuromuscular re-education. Pt jokingly getting frustrated with activity and incorporating right hand frequently over left hand; howevever, pt tolerated HEP well.  PATIENT EDUCATION: Education details: OT POC, coordination HEP Person educated: Patient and Spouse Education method: Explanation, Demonstration, Tactile cues, Verbal cues, and Handouts Education comprehension: verbalized understanding, returned demonstration, verbal  cues required, tactile cues required, and needs further education  HOME EXERCISE PROGRAM: 09/02/22: Coordination HEP   GOALS: Goals reviewed with patient? Yes  SHORT TERM GOALS: Target date: 09/30/2022   Pt will be independent in LUE HEP. Baseline: Initiated Goal status: INITIAL  2.  Pt will demonstrate improved fine motor coordination for ADLs as evidenced by decreasing 9 hole peg test score for left  hand by >/=5 secs  Baseline: Lt - 59 secs Goal status: INITIAL  3.  Pt will be able to place at least 3 blocks using left hand with completion of Box and Blocks test.  Baseline: Lt- 19 blocks Goal status: INITIAL  4.  Pt will tolerate >/=10 minutes of standing activity to simulate activity tolerance needed for ADL/IADL management. Baseline: Subjective description of sitting down for most tasks Goal status: INITIAL  5.  Pt will write a sentence with no significant decrease in size and maintain 100% legibility.  Baseline: 75% legibility and mild micrographia Goal status: INITIAL  6.  Pt will improve visual closure skills to recognize and complete visual patterns or missing parts of visual stimuli, enhancing visual perception and problem-solving abilities. Baseline: Overshooting eye movements, impaired peripheral vision, merging of letters per subjective report Goal status: INITIAL  LONG TERM GOALS: Target date: 11/11/2022   Pt will complete FOTO assessment at time of discharge scoring 68 or greater indicating functional progression with ADL and IADL completion. Baseline: 58  Goal status: INITIAL  2.  Pt will demonstrate improved fine motor coordination for ADLs as evidenced by decreasing 9 hole peg test score for left hand by >/=10 secs  Baseline: Lt - 59 secs Goal status: INITIAL  3.  Pt will be able to place at least 5 blocks using left hand with completion of Box and Blocks test.  Baseline: Lt - 19 blocks Goal status: INITIAL  4.  Pt will improve dynamic standing balance  to Good (stand independently unsupported, able to weight shift and cross midline moderately) as needed for managing ADL/IADL tasks. Baseline: Subjective description of Fair (stand independently unsupported, weight shift, and reach ipsilaterally, LOB when crossing midline) with rolling walker Goal status: INITIAL  5.  Pt will verbalize at least two visual compensatory methods to include adaptive equipment as needed independently. Baseline:  Goal status: INITIAL   ASSESSMENT:  CLINICAL IMPRESSION: Patient is a 62 y.o. male who was seen today for occupational therapy evaluation for acute infarction in the right superior cerebellar hemisphere. Prior to December, pt independent in ADLs, IADLs, driving, leisure, and work. Since that time, pt requiring grossly supervision for ADLs, dependent in IADLs, unable to perform desired leisure task of golfing, and having difficulty with work-related tasks. Pt AROM and MMT grossly WFL. Pt's current deficits include impaired coordination, visual deficits, poor activity tolerance, and impaired balance impacting independence in activities of daily living. Pt would benefit from skilled OT services to address deficits, and to maximize independence in various occupations.   PERFORMANCE DEFICITS: in functional skills including ADLs, IADLs, coordination, dexterity, pain, Fine motor control, Gross motor control, mobility, balance, body mechanics, endurance, vision, and UE functional use, cognitive skills including attention, energy/drive, and memory, and psychosocial skills including coping strategies, environmental adaptation, habits, and routines and behaviors.   IMPAIRMENTS: are limiting patient from ADLs, IADLs, work, and leisure.   CO-MORBIDITIES: may have co-morbidities  that affects occupational performance. Patient will benefit from skilled OT to address above impairments and improve overall function.  MODIFICATION OR ASSISTANCE TO COMPLETE EVALUATION: No  modification of tasks or assist necessary to complete an evaluation.  OT OCCUPATIONAL PROFILE AND HISTORY: Detailed assessment: Review of Yu and additional review of physical, cognitive, psychosocial history related to current functional performance.  CLINICAL DECISION MAKING: LOW - limited treatment options, no task modification necessary  REHAB POTENTIAL: Good  EVALUATION COMPLEXITY: Low    PLAN:  OT FREQUENCY: 2x/week  OT DURATION: 10 weeks (  anticipate 8 weeks, but pt going out of town for the summer months)  PLANNED INTERVENTIONS: self care/ADL training, therapeutic exercise, therapeutic activity, neuromuscular re-education, balance training, functional mobility training, electrical stimulation, fluidotherapy, moist heat, cryotherapy, patient/family education, cognitive remediation/compensation, visual/perceptual remediation/compensation, psychosocial skills training, energy conservation, coping strategies training, DME and/or AE instructions, and Re-evaluation  RECOMMENDED OTHER SERVICES: Follow-up with neuro ophthalmologist. Pt reports having an "eye doctor" that he goes to annually.  CONSULTED AND AGREED WITH PLAN OF CARE: Patient and family member/caregiver  PLAN FOR NEXT SESSION: Review coordination HEP, toss ball back and forth, bean bags, vision, scanning, desensitization possibly for scalp   Areonna Bran M Rainna Nearhood, OT 09/02/2022, 9:39 AM

## 2022-09-02 NOTE — Therapy (Signed)
OUTPATIENT PHYSICAL THERAPY NEURO TREATMENT   Patient Name: Christopher Yu MRN: [id number] DOB:Apr 28, 1961, 62 y.o., male Today's Date: 09/02/2022   PCP: Thana Ates, MD REFERRING PROVIDER: Charlton Amor, PA-C  END OF SESSION:  PT End of Session - 09/02/22 0900     Visit Number 2    Number of Visits 17    Date for PT Re-Evaluation 11/07/22    Authorization Type BC[employer]    PT Start Time [phone]   received from OT   PT Stop Time 1013    PT Time Calculation (min) 40 min    Equipment Utilized During Treatment Gait belt    Activity Tolerance Patient tolerated treatment well    Behavior During Therapy WFL for tasks assessed/performed;Flat affect             Past Medical History:  Diagnosis Date   Anxiety    Arthritis, rheumatoid (HCC)    dx 1988   CAD (coronary artery disease), native coronary artery    Mild LAD disease with calcification noted at cath 12//27/16    Cardiomyopathy (HCC)    Cardiomyopathy (HCC)    Chronic systolic heart failure (HCC) 05/01/2015   Depression    Deviated nasal septum 06/25/2011   Headache    High cholesterol    Hyperlipidemia    Impingement syndrome of left shoulder 12/22/2017   Macular degeneration    Nausea and vomiting 08/28/2022   Rheumatoid aortitis    Sleep apnea    does not use cpap - UNABLE TO TOLERATE MASK   Sleep apnea in adult    deviated septum repaired, most recent sleep study was negative   Status post total right knee replacement 06/01/2015   Past Surgical History:  Procedure Laterality Date   ANKLE FUSION Right 05/05/2012   related to his arthritis   ANKLE FUSION Left 05/25/2013   related to his arthritis   ANKLE SURGERY Bilateral    APPLICATION OF CRANIAL NAVIGATION N/A 04/11/2022   Procedure: APPLICATION OF CRANIAL NAVIGATION;  Surgeon: Lisbeth Renshaw, MD;  Location: [facility] OR;  Service: Neurosurgery;  Laterality: N/A;   APPLICATION OF CRANIAL NAVIGATION N/A 08/08/2022   Procedure: APPLICATION OF  CRANIAL NAVIGATION;  Surgeon: Lisbeth Renshaw, MD;  Location: [facility] OR;  Service: Neurosurgery;  Laterality: N/A;   CARDIAC CATHETERIZATION N/A 05/01/2015   Procedure: Left Heart Cath and Coronary Angiography;  Surgeon: Lyn Records, MD;  Location: [facility] INVASIVE CV LAB;  Service: Cardiovascular;  Laterality: N/A;   CRANIOTOMY N/A 04/11/2022   Procedure: STEREOTACTIC SUBOCCIPITAL CRANIOTOMY FOR RESECTION OF ARTERIO-VENOUS MALFORMATION;  Surgeon: Lisbeth Renshaw, MD;  Location: [facility] OR;  Service: Neurosurgery;  Laterality: N/A;   FRACTURE SURGERY     left femur fracture x 3   IR ANGIO EXTERNAL CAROTID SEL EXT CAROTID BILAT MOD SED  01/28/2022   IR ANGIO INTRA EXTRACRAN SEL INTERNAL CAROTID BILAT MOD SED  01/28/2022   IR ANGIO INTRA EXTRACRAN SEL INTERNAL CAROTID BILAT MOD SED  04/16/2022   IR ANGIO VERTEBRAL SEL VERTEBRAL BILAT MOD SED  01/28/2022   IR ANGIO VERTEBRAL SEL VERTEBRAL BILAT MOD SED  04/16/2022   KNEE ARTHROSCOPY     right x 4   LAPAROSCOPIC REVISION VENTRICULAR-PERITONEAL (V-P) SHUNT N/A 08/08/2022   Procedure: LAPAROSCOPIC VENTRICULAR-PERITONEAL (V-P) SHUNT;  Surgeon: Harriette Bouillon, MD;  Location: [facility] OR;  Service: General;  Laterality: N/A;   LUMBAR LAMINECTOMY/DECOMPRESSION MICRODISCECTOMY N/A 07/18/2022   Procedure: Repair of Pseudomeningiocele posterior;  Surgeon: Lisbeth Renshaw, MD;  Location: [facility] OR;  Service: Neurosurgery;  Laterality: N/A;   NASAL SEPTOPLASTY W/ TURBINOPLASTY  06/25/2011   Procedure: NASAL SEPTOPLASTY WITH TURBINATE REDUCTION;  Surgeon: Osborn Coho, MD;  Location: [facility] OR;  Service: ENT;  Laterality: Bilateral;   PLACEMENT OF LUMBAR DRAIN N/A 07/18/2022   Procedure: PLACEMENT OF LUMBAR DRAIN;  Surgeon: Lisbeth Renshaw, MD;  Location: [facility] OR;  Service: Neurosurgery;  Laterality: N/A;   TONSILLECTOMY     TOTAL KNEE ARTHROPLASTY Right 06/01/2015   Procedure: RIGHT TOTAL KNEE ARTHROPLASTY;  Surgeon: Kathryne Hitch, MD;  Location: [facility] ORS;  Service:  Orthopedics;  Laterality: Right;  Block+general   VENTRICULOPERITONEAL SHUNT Right 08/08/2022   Procedure: LAP ASSITED SHUNT INSERTION VENTRICULAR-PERITONEAL;  Surgeon: Lisbeth Renshaw, MD;  Location: [facility] OR;  Service: Neurosurgery;  Laterality: Right;   Patient Active Problem List   Diagnosis Date Noted   Nausea and vomiting 08/28/2022   Adjustment disorder 08/06/2022   Protein-calorie malnutrition, severe 07/31/2022   Cerebellar stroke (HCC) 07/29/2022   Ischemic cerebrovascular accident (CVA) (HCC) 07/28/2022   Prolonged QT interval 07/28/2022   Pseudomeningocele 07/18/2022   Pseudomeningocele due to surgical procedure 07/18/2022   Status post craniotomy 04/11/2022   AVM (arteriovenous malformation) brain 04/11/2022   Impingement syndrome of left shoulder 12/22/2017   Impingement syndrome of right shoulder 12/22/2017   Rheumatoid arthritis involving right knee (HCC) 06/01/2015   Status post total right knee replacement 06/01/2015   Cardiomyopathy (HCC) 05/02/2015   Hyperlipidemia    Sleep apnea in adult    Rheumatoid arthritis (HCC)    Chronic systolic heart failure (HCC) 05/01/2015   CAD (coronary artery disease), native coronary artery     ONSET DATE: 08/21/2022 (referral date)  REFERRING DIAG: I63.9 (ICD-10-CM) - Cerebral infarction, unspecified  THERAPY DIAG:  Abnormality of gait and mobility  Unsteadiness on feet  Other lack of coordination  Dizziness and giddiness  Muscle weakness (generalized)  Difficulty in walking, not elsewhere classified  Rationale for Evaluation and Treatment: Rehabilitation  SUBJECTIVE:                                                                                                                                                                                             SUBJECTIVE STATEMENT: Patient arrives to clinic, ambulating with RW with wife. Reports low energy levels, not doing much at home. Still very nauseated and low  appetite. Has not eaten today. Denies falls/near falls.   Pt accompanied by: self and significant other - wife Christopher Yu  PERTINENT HISTORY: cerebellar AVM resection 12/23, pseudomeningocele s/p craniotomy 3/24, acute R cerebellar infarct, hydrocephalus and VP shunt was placed on 08/08/22, dysphagia, dysarthria, aphasia  PMH: rheumatoid arthritis, bilateral ankle fusion (2014 and 2015), cardiomyopathy, CAD, R TKA, cardiomyopathy, chronic systolic heart failure, macular degeneration, OSA on CPAP  PAIN:  Are you having pain? No  PRECAUTIONS: Fall and Other: swallow precautions with modification with left head tilt and supervision if drinking thinned liquids, per caregiver not to lift, history of orthostatic hypotension resulting in falls  PATIENT GOALS: "To get back to playing golf and moving like I used to."  OBJECTIVE:   DIAGNOSTIC FINDINGS:   CT on 08/27/2022: IMPRESSION: 1. No evidence of acute infarct or hemorrhage. 2. Stable right-sided ventriculostomy catheter, tip within the right lateral ventricle. Stable size of the lateral ventricles without hydrocephalus. 3. Postsurgical changes from prior suboccipital craniectomy, with underlying encephalomalacia in the cerebellum. The pseudomeningocele seen on prior study has increased slightly in size since prior exam, of uncertain significance. 4. Decreased size of the right superior cerebellar intraparenchymal cyst, with decreased mass effect upon the fourth ventricle.  MR Brain on 07/28/2022: IMPRESSION: 1. New small focus of acute infarction in the right superior cerebellar hemisphere with surrounding area of increased T2 signal and cystic change, which is new since the prior MRI dated 07/05/2022 and suspicious for now chronic infarct. 2. Postoperative changes of prior left posterior fossa resection. Redemonstrated foci of gas within the resection cavity, fourth ventricle and extra-axial spaces, likely secondary to  recent pseudomeningocele repair and/or lumbar drain placement/removal. 3. Decreased size of the fluid collection in the dorsal soft tissues overlying the suboccipital craniectomy.  TODAY'S TREATMENT:                                                                                                                               [facility] PT Assessment - 09/02/22 0001       Berg Balance Test   Sit to Stand Able to stand without using hands and stabilize independently    Standing Unsupported Able to stand 2 minutes with supervision    Sitting with Back Unsupported but Feet Supported on Floor or Stool Able to sit safely and securely 2 minutes    Stand to Sit Controls descent by using hands    Transfers Able to transfer safely, definite need of hands    Standing Unsupported with Eyes Closed Able to stand 10 seconds with supervision    Standing Unsupported with Feet Together Able to place feet together independently and stand for 1 minute with supervision    From Standing, Reach Forward with Outstretched Arm Reaches forward but needs supervision    From Standing Position, Pick up Object from Floor Unable to try/needs assist to keep balance    From Standing Position, Turn to Look Behind Over each Shoulder Needs supervision when turning    Turn 360 Degrees Needs close supervision or verbal cueing    Standing Unsupported, Alternately Place Feet on Step/Stool Able to complete >2 steps/needs minimal assist    Standing Unsupported, One Foot in Front Able to take small  step independently and hold 30 seconds    Standing on One Leg Unable to try or needs assist to prevent fall   patient refused   Total Score 29            -115' RW + close supervision (raised height of walker 1 notch to support tall posture)  -extensive conversation regarding increasing activity levels at home -initiated saccades exercises with plain target and Edwyna Shell Chart   PATIENT EDUCATION: Education details: POC, examination findings,  results, collaboration on goals, Brief education provided on how to recognize signs of Stroke and printed BE FAST pdf and explained that one would be looking for these symptoms as changes from baseline Person educated: Patient and Spouse Education method: Explanation and Handouts Education comprehension: verbalized understanding and needs further education  HOME EXERCISE PROGRAM: You Can Walk For A Certain Length Of Time Each Day                          Walk 2 minutes 1 times per day.             Increase 2  minutes every 3 days              Work up to 30 minutes (1-2 times per day).               Example:                         Day 1-2           4-5 minutes     3 times per day                         Day 7-8           10-12 minutes 2-3 times per day                         Day 13-14       20-22 minutes 1-2 times per day  Oculomotor: Saccades    Holding two targets positioned side by side __6__ inches apart, move eyes quickly from target to target as head stays still.  Perform sitting. Repeat __10__ times per session. Do __3__ sessions per day. GOALS: Goals reviewed with patient? Yes  SHORT TERM GOALS: Target date: 09/26/2022  Patient will demonstrate 100% compliance with initial HEP to continue to progress between physical therapy sessions.   Baseline: To be provided Goal status: INITIAL  2.  Patient will improve their 5x Sit to Stand score to less than 18 seconds to demonstrate a decreased risk for falls and improved LE strength.   Baseline: 22.45" without UE support/unsteady WBOS Goal status: INITIAL  3.  Patient will improve TUG score to 17 seconds or less with LRAD to indicate a decreased risk of falls and demonstrate improved overall mobility.  Baseline: 21.27" with RW Goal status: INITIAL  4.  Patient will improve gait speed to 0.87 m/s to indicate improvement to the level of community ambulator in order to participate more easily in activities outside of the home  such as adapted golf.   Baseline: 0.77 m/s Goal status: INITIAL  5. Patient will improve BBS score to >/= 38/56 to demonstrate improved balance Baseline: 29/56 Goal status: INITIAL  LONG TERM GOALS: Target date: 10/24/2022  Patient will report demonstrate independence with final HEP in order  to maintain current gains and continue to progress after physical therapy discharge.   Baseline: To be provided Goal status: INITIAL  2.   Patient will improve their 5x Sit to Stand score to 15 seconds to demonstrate a decreased risk for falls and improved LE strength.   Baseline: 22.45" without UE support/unsteady WBOS Goal status: INITIAL  3.  Patient will improve TUG score to 15 seconds or less with LRAD to indicate a decreased risk of falls and demonstrate improved overall mobility.  Baseline: 21.27" with RW Goal status: INITIAL  4.  Patient will improve gait speed to 1.0 m/s to indicate improvement to the level of community ambulator in order to participate more easily in activities outside of the home such as adapted golf.   Baseline: 0.77 m/s Goal status: INITIAL  5.  Patient will improve FOTO score to 57 to achieve predicted improvements in functional mobility due to skilled physical therapy interventions to increase safety with and participation in daily activities. Baseline: 49 Goal status: INITIAL  6.  Patient will improve BBS score to >/= 47/56 to demonstrate improved balance Baseline: 29/56 Goal status: INITIAL  ASSESSMENT:  CLINICAL IMPRESSION: Patient seen for skilled PT session with emphasis on finishing outcome measures and initiating HEP. Extensive education on the need for increasing general activity levels at home/progressively limiting daytime sleep for return to normal function. Patient demonstrated increased fall risk noted by score of 29/56 on the [facility] Scale.  <45/56 = fall risk, <42/56 = predictive of recurrent falls, <40/56 = 100% fall risk  >41 =  independent, 21-40 = assistive device, 0-20 = wheelchair level  MDC 6.9 (4 pts 45-56, 5 pts 35-44, 7 pts 25-34) (ANPTA Core Set of Outcome Measures for Adults with Neurologic Conditions, 2018). Patient with persistent hypermetria with saccades, which will impact his visual scanning while ambulating and thus his balance. Initiated saccades HEP as well as walking program. Balance tasks to be added. Continue POC.    OBJECTIVE IMPAIRMENTS: Abnormal gait, cardiopulmonary status limiting activity, decreased activity tolerance, decreased balance, decreased cognition, decreased coordination, decreased endurance, decreased knowledge of condition, decreased mobility, difficulty walking, decreased ROM, decreased strength, and impaired tone.   ACTIVITY LIMITATIONS: carrying, lifting, bending, sitting, standing, squatting, stairs, transfers, reach over head, locomotion level, and caring for others  PARTICIPATION LIMITATIONS: cleaning, driving, shopping, community activity, occupation, and yard work, Systems analyst  PERSONAL FACTORS: Past/current experiences and 3+ comorbidities: see above  are also affecting patient's functional outcome, reliance on others for transportation   REHAB POTENTIAL: Good - may be limited by complex medical history  CLINICAL DECISION MAKING: Evolving/moderate complexity  EVALUATION COMPLEXITY: Moderate  PLAN:  PT FREQUENCY: 2x/week  PT DURATION: 8 weeks  PLANNED INTERVENTIONS: Therapeutic exercises, Therapeutic activity, Neuromuscular re-education, Balance training, Gait training, Patient/Family education, Self Care, Joint mobilization, Stair training, Vestibular training, DME instructions, Wheelchair mobility training, Manual therapy, and Re-evaluation  PLAN FOR NEXT SESSION: monitor vitals/orthostatic hypotension, add to HEP for balance, high intensity gait training as tolerated, balance, integrate golfing concepts as able  Westley Foots, PT, DPT, CBIS 09/02/2022, 10:31  AM

## 2022-09-04 ENCOUNTER — Ambulatory Visit: Payer: BC Managed Care – PPO | Attending: Neurosurgery | Admitting: Occupational Therapy

## 2022-09-04 ENCOUNTER — Ambulatory Visit: Payer: BC Managed Care – PPO

## 2022-09-04 ENCOUNTER — Encounter: Payer: Self-pay | Admitting: Occupational Therapy

## 2022-09-04 DIAGNOSIS — M6281 Muscle weakness (generalized): Secondary | ICD-10-CM | POA: Insufficient documentation

## 2022-09-04 DIAGNOSIS — R278 Other lack of coordination: Secondary | ICD-10-CM

## 2022-09-04 DIAGNOSIS — R41842 Visuospatial deficit: Secondary | ICD-10-CM | POA: Diagnosis not present

## 2022-09-04 DIAGNOSIS — R471 Dysarthria and anarthria: Secondary | ICD-10-CM | POA: Diagnosis not present

## 2022-09-04 DIAGNOSIS — R42 Dizziness and giddiness: Secondary | ICD-10-CM

## 2022-09-04 DIAGNOSIS — R2681 Unsteadiness on feet: Secondary | ICD-10-CM | POA: Diagnosis not present

## 2022-09-04 DIAGNOSIS — R262 Difficulty in walking, not elsewhere classified: Secondary | ICD-10-CM

## 2022-09-04 DIAGNOSIS — R1310 Dysphagia, unspecified: Secondary | ICD-10-CM | POA: Insufficient documentation

## 2022-09-04 DIAGNOSIS — R269 Unspecified abnormalities of gait and mobility: Secondary | ICD-10-CM

## 2022-09-04 DIAGNOSIS — R41840 Attention and concentration deficit: Secondary | ICD-10-CM | POA: Insufficient documentation

## 2022-09-04 NOTE — Patient Instructions (Signed)
Coordination Activities   Perform the following activities for 10 minutes 1 time per day with left hand(s).   Rotate ball in fingertips (clockwise and counter-clockwise).  Toss ball in air and catch with the same hand.  Flip cards 1 at a time as fast as you can.  Shuffle cards.  Pick up coins, buttons, marbles, dried beans/pasta of different sizes and place in container.  Pick up coins one at a time until you get 5-10 in your hand, then move coins from palm to fingertips to stack one at a time.  Twirl pen between fingers.  Practice writing and/or typing.   VISION EXERCISES:   1) Follow letter slowly side to side x 5, then up and down x 5 - keeping head still  2) Look from one letter to the next (side to side, up and down, vertical patterns)

## 2022-09-04 NOTE — Therapy (Signed)
OUTPATIENT OCCUPATIONAL THERAPY NEURO EVALUATION  Patient Name: Christopher Yu MRN: [id number] DOB:1960-08-03, 62 y.o., male Today's Date: 09/04/2022  PCP: Thana Ates, MD REFERRING PROVIDER: Charlton Amor, PA-C  END OF SESSION:  OT End of Session - 09/04/22 0931     Visit Number 1    Number of Visits 20    Date for OT Re-Evaluation 06/20/22    Authorization Type [employer]    OT Start Time 0930    OT Stop Time 1015    OT Time Calculation (min) 45 min    Activity Tolerance Patient tolerated treatment well    Behavior During Therapy [facility] for tasks assessed/performed;Flat affect             Past Medical History:  Diagnosis Date   Anxiety    Arthritis, rheumatoid (HCC)    dx 1988   CAD (coronary artery disease), native coronary artery    Mild LAD disease with calcification noted at cath 12//27/16    Cardiomyopathy (HCC)    Cardiomyopathy (HCC)    Chronic systolic heart failure (HCC) 05/01/2015   Depression    Deviated nasal septum 06/25/2011   Headache    High cholesterol    Hyperlipidemia    Impingement syndrome of left shoulder 12/22/2017   Macular degeneration    Nausea and vomiting 08/28/2022   Rheumatoid aortitis    Sleep apnea    does not use cpap - UNABLE TO TOLERATE MASK   Sleep apnea in adult    deviated septum repaired, most recent sleep study was negative   Status post total right knee replacement 06/01/2015   Past Surgical History:  Procedure Laterality Date   ANKLE FUSION Right 05/05/2012   related to his arthritis   ANKLE FUSION Left 05/25/2013   related to his arthritis   ANKLE SURGERY Bilateral    APPLICATION OF CRANIAL NAVIGATION N/A 04/11/2022   Procedure: APPLICATION OF CRANIAL NAVIGATION;  Surgeon: Lisbeth Renshaw, MD;  Location: [facility] OR;  Service: Neurosurgery;  Laterality: N/A;   APPLICATION OF CRANIAL NAVIGATION N/A 08/08/2022   Procedure: APPLICATION OF CRANIAL NAVIGATION;  Surgeon: Lisbeth Renshaw, MD;  Location: [facility] OR;   Service: Neurosurgery;  Laterality: N/A;   CARDIAC CATHETERIZATION N/A 05/01/2015   Procedure: Left Heart Cath and Coronary Angiography;  Surgeon: Lyn Records, MD;  Location: [facility] INVASIVE CV LAB;  Service: Cardiovascular;  Laterality: N/A;   CRANIOTOMY N/A 04/11/2022   Procedure: STEREOTACTIC SUBOCCIPITAL CRANIOTOMY FOR RESECTION OF ARTERIO-VENOUS MALFORMATION;  Surgeon: Lisbeth Renshaw, MD;  Location: [facility] OR;  Service: Neurosurgery;  Laterality: N/A;   FRACTURE SURGERY     left femur fracture x 3   IR ANGIO EXTERNAL CAROTID SEL EXT CAROTID BILAT MOD SED  01/28/2022   IR ANGIO INTRA EXTRACRAN SEL INTERNAL CAROTID BILAT MOD SED  01/28/2022   IR ANGIO INTRA EXTRACRAN SEL INTERNAL CAROTID BILAT MOD SED  04/16/2022   IR ANGIO VERTEBRAL SEL VERTEBRAL BILAT MOD SED  01/28/2022   IR ANGIO VERTEBRAL SEL VERTEBRAL BILAT MOD SED  04/16/2022   KNEE ARTHROSCOPY     right x 4   LAPAROSCOPIC REVISION VENTRICULAR-PERITONEAL (V-P) SHUNT N/A 08/08/2022   Procedure: LAPAROSCOPIC VENTRICULAR-PERITONEAL (V-P) SHUNT;  Surgeon: Harriette Bouillon, MD;  Location: [facility] OR;  Service: General;  Laterality: N/A;   LUMBAR LAMINECTOMY/DECOMPRESSION MICRODISCECTOMY N/A 07/18/2022   Procedure: Repair of Pseudomeningiocele posterior;  Surgeon: Lisbeth Renshaw, MD;  Location: [facility] OR;  Service: Neurosurgery;  Laterality: N/A;   NASAL SEPTOPLASTY W/ TURBINOPLASTY  06/25/2011  Procedure: NASAL SEPTOPLASTY WITH TURBINATE REDUCTION;  Surgeon: Osborn Coho, MD;  Location: [facility] OR;  Service: ENT;  Laterality: Bilateral;   PLACEMENT OF LUMBAR DRAIN N/A 07/18/2022   Procedure: PLACEMENT OF LUMBAR DRAIN;  Surgeon: Lisbeth Renshaw, MD;  Location: [facility] OR;  Service: Neurosurgery;  Laterality: N/A;   TONSILLECTOMY     TOTAL KNEE ARTHROPLASTY Right 06/01/2015   Procedure: RIGHT TOTAL KNEE ARTHROPLASTY;  Surgeon: Kathryne Hitch, MD;  Location: [facility] ORS;  Service: Orthopedics;  Laterality: Right;  Block+general   VENTRICULOPERITONEAL  SHUNT Right 08/08/2022   Procedure: LAP ASSITED SHUNT INSERTION VENTRICULAR-PERITONEAL;  Surgeon: Lisbeth Renshaw, MD;  Location: [facility] OR;  Service: Neurosurgery;  Laterality: Right;   Patient Active Problem List   Diagnosis Date Noted   Nausea and vomiting 08/28/2022   Adjustment disorder 08/06/2022   Protein-calorie malnutrition, severe 07/31/2022   Cerebellar stroke (HCC) 07/29/2022   Ischemic cerebrovascular accident (CVA) (HCC) 07/28/2022   Prolonged QT interval 07/28/2022   Pseudomeningocele 07/18/2022   Pseudomeningocele due to surgical procedure 07/18/2022   Status post craniotomy 04/11/2022   AVM (arteriovenous malformation) brain 04/11/2022   Impingement syndrome of left shoulder 12/22/2017   Impingement syndrome of right shoulder 12/22/2017   Rheumatoid arthritis involving right knee (HCC) 06/01/2015   Status post total right knee replacement 06/01/2015   Cardiomyopathy (HCC) 05/02/2015   Hyperlipidemia    Sleep apnea in adult    Rheumatoid arthritis (HCC)    Chronic systolic heart failure (HCC) 05/01/2015   CAD (coronary artery disease), native coronary artery     ONSET DATE: 08/21/2022 (date of referral)  REFERRING DIAG: I63.9 (ICD-10-CM) - Cerebral infarction, unspecified  THERAPY DIAG:  Visuospatial deficit  Other lack of coordination  Unsteadiness on feet  Rationale for Evaluation and Treatment: Rehabilitation  SUBJECTIVE:   SUBJECTIVE STATEMENT: Pt's wife reports he has quite a bit of fatigue and mostly sedentary currently. Pt also experiencing nausea frequently, and MD to prescribe anti-nausea medicine. This is impacting appetite as well.  Pt accompanied by: self and significant other, [name]  PERTINENT HISTORY: CT/MRI showed new small focus of acute infarction in the right superior cerebellar hemisphere. Cerebellar AVM resection 12/23, pseudomeningocele s/p craniotomy 3/24, acute R cerebellar infarct, hydrocephalus and VP shunt was placed on 08/08/22,  dysphagia, dysarthria, aphasia. PMH: rheumatoid arthritis, bilateral ankle fusion (2014 and 2015), cardiomyopathy, CAD, R TKA, cardiomyopathy, chronic systolic heart failure, macular degeneration, OSA on CPAP  PRECAUTIONS: Fall and Other: swallow precautions with modification with left head tilt and supervision if drinking thinned liquids, per caregiver not to lift, history of orthostatic hypotension resulting in falls; no driving  WEIGHT BEARING RESTRICTIONS: No  PAIN:  Are you having pain? No, wife reports taking oxycodone this morning. Pt reports sometimes having pain in his head.   FALLS: Has patient fallen in last 6 months? Yes. Number of falls 3 - after the first surgery, got dizzy and fell, using the walker, when up and moving, struggled with orthostatic hypotension but medical team has since decreased BP medications  LIVING ENVIRONMENT: Lives with: lives with their spouse, two teenage daughters (also has dtr who lives in [place]), and 175 lb dog Lives in: House/apartment - Able to live on main floor Stairs: Yes: Internal: ~14 steps; on right going up and External: 3 steps; on right going up Has following equipment at home: Dan Humphreys - 2 wheeled and Wheelchair (manual) Bathroom setup: [employer], regular height toilet  PLOF: Prior to December, pt completely independent with ADL/IADLs, ambulatory without device,  pt enjoys golfing, driving, 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  PATIENT GOALS: Return to golf, improve vision for work, travel in June for daughter's outdoor wedding (be off the walker ideally)  OBJECTIVE: (results from evaluation unless otherwise stated)  HAND DOMINANCE: Right  ADLs: Eating: Independent with self-feeding, wife cuts items due to impaired coordination per her report Grooming: Independent mostly, but reports it's painful to brush hair from surgical procedure in December UB Dressing: Supervision, increased time and wife present if needed LB Dressing:  Supervision increased time and wife present if needed. Sometimes sits to don pants Toileting: Supervision, more effort, wife there in case pt loses balance Bathing: Supervision for balance Tub Shower transfers: Supervision likely; however, sponge bath currently due to hand held shower head breaking and plumber coming 5/1.  Equipment:  Toilet riser with armrests , shower chair, bedrail on bed  IADLs: Shopping: Currently unable, previously independent Light housekeeping: Currently unable, previously did laundry Meal Prep: Currently unable, but previously assisted in task Community mobility: and household, pt utilizing rolling walker at all times. Typically sedentary in recliner most of the day. Gets up for bathroom.  Medication management: Wife Presenter, broadcasting: Currently unable, pt typically did before Handwriting: 75% legible and Mild micrographia   MOBILITY STATUS:  Pt ambulatory with RW, wife providing supervision  at all times  POSTURE COMMENTS:  rounded shoulders Sitting balance: Moves/returns truncal midpoint 1-2 inches in multiple planes - Wife describes she believes it is more of a coordination issue, overshooting and then losing balance.   ACTIVITY TOLERANCE: Activity tolerance: Subjective report of significant fatigue  FUNCTIONAL OUTCOME MEASURES: FOTO: 58 score  UPPER EXTREMITY ROM:    Active ROM Right eval Left eval  Shoulder flexion [facility]  Shoulder abduction [facility]  Shoulder adduction [facility] WFL  Shoulder extension [facility]  Shoulder internal rotation    Shoulder external rotation    Elbow flexion WFL WFL  Elbow extension [facility] WFL  Wrist flexion    Wrist extension    Wrist ulnar deviation    Wrist radial deviation    Wrist pronation    Wrist supination    (Blank rows = not tested)  UPPER EXTREMITY MMT:     MMT Right eval Left eval  Shoulder flexion 4+/5 4+/5  Shoulder abduction    Shoulder adduction    Shoulder extension    Shoulder  internal rotation    Shoulder external rotation    Middle trapezius    Lower trapezius    Elbow flexion 4+/5 4+/5  Elbow extension 4+/5 4+/5  Wrist flexion    Wrist extension    Wrist ulnar deviation    Wrist radial deviation    Wrist pronation    Wrist supination    (Blank rows = not tested)  HAND FUNCTION: Grip strength: Right: 67.2 lbs; Left: 66.3 lbs and 3 point pinch: Right: 12 lbs, Left: 15 lbs  COORDINATION: 9 Hole Peg test: Right: 31 sec; Left: 59 sec Box and Blocks:  Right 27 blocks, Left 19blocks  SENSATION: WFL  EDEMA: None  MUSCLE TONE: WFL  COGNITION: Overall cognitive status:  Grossly WFL, possible for attention deficits.  VISION: Subjective report: Pt reports some jerky vision since December Baseline vision: Wears glasses all the time Visual history: cataracts and macular degeneration  VISION ASSESSMENT: Impaired Eye alignment: Impaired: Right eye seemed to have amblyopia-type appearance with visual pursuits  Reading acuity: Pt reports difficulty reading instructions at this time, he reports all letters merge together  Tracking/Visual pursuits: Right eye appears to be slower than left Saccades: overshoots - right eye from temporal to nasal Convergence: WFL Visual Fields: Grossly intact; however, decreased peripheral vision to right side when provided with moving target with detection of object at 30* Diplopia assessment: When given macular degeneration Amsler grid, pt reports double vision of right eye when left eye occluded. Quadrants - WFL   Patient has difficulty with following activities due to following visual impairments: Work related tasks  PERCEPTION: WFL  PRAXIS: WFL  OBSERVATIONS: Patient arrives to session ambulating with rolling walker. Patient wife primarily speaks during the session given patient's dysarthria. Per caregiver, patient had a surgery to address AVM and then had pseudomengiocele and underwent 3 surgeries as a result of  complications. Per report, patient was D/C from hospital and then returned with symptoms of acute stroke. Prior to December patient was fully independent, working full time in IT, and golfing regularly every weekend; however, patient is now fully supervised for all ADLs, not working, and now mobilizing either via manual W/C or walker. Patient reports increased nausea disrupting appetite. Family reports going to the ED on last Wednesday due to new reports of nausea and double vision and there was concern for another stroke; however, family decided to the return home when CT came back clear and did not want to go through additional MRI. Per family, inpatient rehab physician aware of this and recommended they call their neurosurgeon for follow up as well and did not advise against continuing outpatient therapy. Family also expresses concern in not knowing how to truly recognize a stroke as patient now has such extensive deficits. Family also reports that a therapist in inpatient rehab noticed unique eye movements and recommend they also see a therapist that could help address vestibular concerns. Wife showing OT video today. Video showed pt performing visual pursuits with jerky eye movement going from left to right sides, right eye impacted.   TODAY'S TREATMENT:                                                                                                                              DATE:  Pt assisted to review coordination HEP this visit with modifications to instructions to improve visual regard and ease of reading ie) larger font, spacing between instructions and isolating instructions via cut out slot in folder.  Please refer to pt instructions. Pt requiring verbal and visual cues for correct technique. Pt easily getting frustrated with activity and incorporating right hand w/ cues needed to use Lt hand.   Pt also shown visual exercises including smooth pursuits and saccades - see pt instructions.   Pt  easily frustrated t/o session and reports at times that he won't do HEP, however when asked what he wants to work on next visit - he says same thing.  Pt/wife instructed in the benefits of HEPs and that working in clinic will not be enough for progress  - that  he must work on at home as well.   PATIENT EDUCATION: Education details: review and modifications to coordination HEP, visual HEP  Person educated: Patient and Spouse Education method: Explanation, Demonstration, Tactile cues, Verbal cues, and Handouts Education comprehension: verbalized understanding, returned demonstration, verbal cues required, tactile cues required, and needs further education  HOME EXERCISE PROGRAM: 09/02/22: Coordination HEP 09/04/22: Modified coordination HEP (for vision) and vision HEP   GOALS: Goals reviewed with patient? Yes  SHORT TERM GOALS: Target date: 09/30/2022   Pt will be independent in LUE HEP. Baseline: Initiated Goal status: IN PROGRESS  2.  Pt will demonstrate improved fine motor coordination for ADLs as evidenced by decreasing 9 hole peg test score for left hand by >/=5 secs  Baseline: Lt - 59 secs Goal status: INITIAL  3.  Pt will be able to place at least 3 blocks using left hand with completion of Box and Blocks test.  Baseline: Lt- 19 blocks Goal status: INITIAL  4.  Pt will tolerate >/=10 minutes of standing activity to simulate activity tolerance needed for ADL/IADL management. Baseline: Subjective description of sitting down for most tasks Goal status: INITIAL  5.  Pt will write a sentence with no significant decrease in size and maintain 100% legibility.  Baseline: 75% legibility and mild micrographia Goal status: INITIAL  6.  Pt will improve visual closure skills to recognize and complete visual patterns or missing parts of visual stimuli, enhancing visual perception and problem-solving abilities. Baseline: Overshooting eye movements, impaired peripheral vision, merging of  letters per subjective report Goal status: INITIAL  LONG TERM GOALS: Target date: 11/11/2022   Pt will complete FOTO assessment at time of discharge scoring 68 or greater indicating functional progression with ADL and IADL completion. Baseline: 58  Goal status: INITIAL  2.  Pt will demonstrate improved fine motor coordination for ADLs as evidenced by decreasing 9 hole peg test score for left hand by >/=10 secs  Baseline: Lt - 59 secs Goal status: INITIAL  3.  Pt will be able to place at least 5 blocks using left hand with completion of Box and Blocks test.  Baseline: Lt - 19 blocks Goal status: INITIAL  4.  Pt will improve dynamic standing balance to Good (stand independently unsupported, able to weight shift and cross midline moderately) as needed for managing ADL/IADL tasks. Baseline: Subjective description of Fair (stand independently unsupported, weight shift, and reach ipsilaterally, LOB when crossing midline) with rolling walker Goal status: INITIAL  5.  Pt will verbalize at least two visual compensatory methods to include adaptive equipment as needed independently. Baseline:  Goal status: INITIAL   ASSESSMENT:  CLINICAL IMPRESSION: Patient returns today for treatment and reports not doing HEP. Pt easily frustrated t/o session today w/ decreased tolerance to activities. Pt limited by awareness/insight and severity of deficits.   PERFORMANCE DEFICITS: in functional skills including ADLs, IADLs, coordination, dexterity, pain, Fine motor control, Gross motor control, mobility, balance, body mechanics, endurance, vision, and UE functional use, cognitive skills including attention, energy/drive, and memory, and psychosocial skills including coping strategies, environmental adaptation, habits, and routines and behaviors.   IMPAIRMENTS: are limiting patient from ADLs, IADLs, work, and leisure.   CO-MORBIDITIES: may have co-morbidities  that affects occupational performance. Patient  will benefit from skilled OT to address above impairments and improve overall function.  REHAB POTENTIAL: Good  PLAN:  OT FREQUENCY: 2x/week  OT DURATION: 10 weeks (anticipate 8 weeks, but pt going out of town for the summer months)  PLANNED INTERVENTIONS: self care/ADL training, therapeutic exercise, therapeutic activity, neuromuscular re-education, balance training, functional mobility training, electrical stimulation, fluidotherapy, moist heat, cryotherapy, patient/family education, cognitive remediation/compensation, visual/perceptual remediation/compensation, psychosocial skills training, energy conservation, coping strategies training, DME and/or AE instructions, and Re-evaluation  RECOMMENDED OTHER SERVICES: Follow-up with neuro ophthalmologist. Pt reports having an "eye doctor" that he goes to annually.  CONSULTED AND AGREED WITH PLAN OF CARE: Patient and family member/caregiver  PLAN FOR NEXT SESSION:  toss ball back and forth, bean bags, vision, scanning, desensitization possibly for scalp   Sheran Lawless, OT 09/04/2022, 10:29 AM

## 2022-09-04 NOTE — Therapy (Signed)
OUTPATIENT PHYSICAL THERAPY NEURO TREATMENT   Patient Name: Toribio Seiber MRN: [id number] DOB:August 05, 1960, 62 y.o., male Today's Date: 09/04/2022   PCP: Thana Ates, MD REFERRING PROVIDER: Charlton Amor, PA-C  END OF SESSION:  PT End of Session - 09/04/22 8657     Visit Number 3    Number of Visits 17    Date for PT Re-Evaluation 11/07/22    Authorization Type [employer]    PT Start Time 0845    PT Stop Time 0930    PT Time Calculation (min) 45 min    Equipment Utilized During Treatment Gait belt    Activity Tolerance Patient tolerated treatment well    Behavior During Therapy WFL for tasks assessed/performed;Flat affect             Past Medical History:  Diagnosis Date   Anxiety    Arthritis, rheumatoid (HCC)    dx 1988   CAD (coronary artery disease), native coronary artery    Mild LAD disease with calcification noted at cath 12//27/16    Cardiomyopathy (HCC)    Cardiomyopathy (HCC)    Chronic systolic heart failure (HCC) 05/01/2015   Depression    Deviated nasal septum 06/25/2011   Headache    High cholesterol    Hyperlipidemia    Impingement syndrome of left shoulder 12/22/2017   Macular degeneration    Nausea and vomiting 08/28/2022   Rheumatoid aortitis    Sleep apnea    does not use cpap - UNABLE TO TOLERATE MASK   Sleep apnea in adult    deviated septum repaired, most recent sleep study was negative   Status post total right knee replacement 06/01/2015   Past Surgical History:  Procedure Laterality Date   ANKLE FUSION Right 05/05/2012   related to his arthritis   ANKLE FUSION Left 05/25/2013   related to his arthritis   ANKLE SURGERY Bilateral    APPLICATION OF CRANIAL NAVIGATION N/A 04/11/2022   Procedure: APPLICATION OF CRANIAL NAVIGATION;  Surgeon: Lisbeth Renshaw, MD;  Location: [facility] OR;  Service: Neurosurgery;  Laterality: N/A;   APPLICATION OF CRANIAL NAVIGATION N/A 08/08/2022   Procedure: APPLICATION OF CRANIAL NAVIGATION;   Surgeon: Lisbeth Renshaw, MD;  Location: [facility] OR;  Service: Neurosurgery;  Laterality: N/A;   CARDIAC CATHETERIZATION N/A 05/01/2015   Procedure: Left Heart Cath and Coronary Angiography;  Surgeon: Lyn Records, MD;  Location: [facility] INVASIVE CV LAB;  Service: Cardiovascular;  Laterality: N/A;   CRANIOTOMY N/A 04/11/2022   Procedure: STEREOTACTIC SUBOCCIPITAL CRANIOTOMY FOR RESECTION OF ARTERIO-VENOUS MALFORMATION;  Surgeon: Lisbeth Renshaw, MD;  Location: [facility] OR;  Service: Neurosurgery;  Laterality: N/A;   FRACTURE SURGERY     left femur fracture x 3   IR ANGIO EXTERNAL CAROTID SEL EXT CAROTID BILAT MOD SED  01/28/2022   IR ANGIO INTRA EXTRACRAN SEL INTERNAL CAROTID BILAT MOD SED  01/28/2022   IR ANGIO INTRA EXTRACRAN SEL INTERNAL CAROTID BILAT MOD SED  04/16/2022   IR ANGIO VERTEBRAL SEL VERTEBRAL BILAT MOD SED  01/28/2022   IR ANGIO VERTEBRAL SEL VERTEBRAL BILAT MOD SED  04/16/2022   KNEE ARTHROSCOPY     right x 4   LAPAROSCOPIC REVISION VENTRICULAR-PERITONEAL (V-P) SHUNT N/A 08/08/2022   Procedure: LAPAROSCOPIC VENTRICULAR-PERITONEAL (V-P) SHUNT;  Surgeon: Harriette Bouillon, MD;  Location: [facility] OR;  Service: General;  Laterality: N/A;   LUMBAR LAMINECTOMY/DECOMPRESSION MICRODISCECTOMY N/A 07/18/2022   Procedure: Repair of Pseudomeningiocele posterior;  Surgeon: Lisbeth Renshaw, MD;  Location: [facility] OR;  Service: Neurosurgery;  Laterality: N/A;   NASAL SEPTOPLASTY W/ TURBINOPLASTY  06/25/2011   Procedure: NASAL SEPTOPLASTY WITH TURBINATE REDUCTION;  Surgeon: Osborn Coho, MD;  Location: [facility] OR;  Service: ENT;  Laterality: Bilateral;   PLACEMENT OF LUMBAR DRAIN N/A 07/18/2022   Procedure: PLACEMENT OF LUMBAR DRAIN;  Surgeon: Lisbeth Renshaw, MD;  Location: [facility] OR;  Service: Neurosurgery;  Laterality: N/A;   TONSILLECTOMY     TOTAL KNEE ARTHROPLASTY Right 06/01/2015   Procedure: RIGHT TOTAL KNEE ARTHROPLASTY;  Surgeon: Kathryne Hitch, MD;  Location: [facility] ORS;  Service: Orthopedics;   Laterality: Right;  Block+general   VENTRICULOPERITONEAL SHUNT Right 08/08/2022   Procedure: LAP ASSITED SHUNT INSERTION VENTRICULAR-PERITONEAL;  Surgeon: Lisbeth Renshaw, MD;  Location: [facility] OR;  Service: Neurosurgery;  Laterality: Right;   Patient Active Problem List   Diagnosis Date Noted   Nausea and vomiting 08/28/2022   Adjustment disorder 08/06/2022   Protein-calorie malnutrition, severe 07/31/2022   Cerebellar stroke (HCC) 07/29/2022   Ischemic cerebrovascular accident (CVA) (HCC) 07/28/2022   Prolonged QT interval 07/28/2022   Pseudomeningocele 07/18/2022   Pseudomeningocele due to surgical procedure 07/18/2022   Status post craniotomy 04/11/2022   AVM (arteriovenous malformation) brain 04/11/2022   Impingement syndrome of left shoulder 12/22/2017   Impingement syndrome of right shoulder 12/22/2017   Rheumatoid arthritis involving right knee (HCC) 06/01/2015   Status post total right knee replacement 06/01/2015   Cardiomyopathy (HCC) 05/02/2015   Hyperlipidemia    Sleep apnea in adult    Rheumatoid arthritis (HCC)    Chronic systolic heart failure (HCC) 05/01/2015   CAD (coronary artery disease), native coronary artery     ONSET DATE: 08/21/2022 (referral date)  REFERRING DIAG: I63.9 (ICD-10-CM) - Cerebral infarction, unspecified  THERAPY DIAG:  Abnormality of gait and mobility  Unsteadiness on feet  Other lack of coordination  Dizziness and giddiness  Muscle weakness (generalized)  Difficulty in walking, not elsewhere classified  Rationale for Evaluation and Treatment: Rehabilitation  SUBJECTIVE:                                                                                                                                                                                             SUBJECTIVE STATEMENT: Patient arrives to clinic, ambulating with RW with wife. Reports low energy levels, not doing much at home. Still very nauseated and low appetite. Has not  eaten today. Denies falls/near falls.   Pt accompanied by: self and significant other - wife Marcia Lepera  PERTINENT HISTORY: cerebellar AVM resection 12/23, pseudomeningocele s/p craniotomy 3/24, acute R cerebellar infarct, hydrocephalus and VP shunt was placed on 08/08/22, dysphagia, dysarthria, aphasia  PMH: rheumatoid  arthritis, bilateral ankle fusion (2014 and 2015), cardiomyopathy, CAD, R TKA, cardiomyopathy, chronic systolic heart failure, macular degeneration, OSA on CPAP  PAIN:  Are you having pain? No  PRECAUTIONS: Fall and Other: swallow precautions with modification with left head tilt and supervision if drinking thinned liquids, per caregiver not to lift, history of orthostatic hypotension resulting in falls  PATIENT GOALS: "To get back to playing golf and moving like I used to."  OBJECTIVE:   DIAGNOSTIC FINDINGS:   CT on 08/27/2022: IMPRESSION: 1. No evidence of acute infarct or hemorrhage. 2. Stable right-sided ventriculostomy catheter, tip within the right lateral ventricle. Stable size of the lateral ventricles without hydrocephalus. 3. Postsurgical changes from prior suboccipital craniectomy, with underlying encephalomalacia in the cerebellum. The pseudomeningocele seen on prior study has increased slightly in size since prior exam, of uncertain significance. 4. Decreased size of the right superior cerebellar intraparenchymal cyst, with decreased mass effect upon the fourth ventricle.  MR Brain on 07/28/2022: IMPRESSION: 1. New small focus of acute infarction in the right superior cerebellar hemisphere with surrounding area of increased T2 signal and cystic change, which is new since the prior MRI dated 07/05/2022 and suspicious for now chronic infarct. 2. Postoperative changes of prior left posterior fossa resection. Redemonstrated foci of gas within the resection cavity, fourth ventricle and extra-axial spaces, likely secondary to recent pseudomeningocele  repair and/or lumbar drain placement/removal. 3. Decreased size of the fluid collection in the dorsal soft tissues overlying the suboccipital craniectomy.  TODAY'S TREATMENT:                                                                                                                              GAIT: -230' RW + light CGA  -attempted [facility], but patient finding it difficult to coordinate  -39' x2 with hurry-cane + CGA with normalized gait pattern noted  -very slight intermittent R lateral sway, but patient able to recover without PT intervention -115' x3 with SPC + CGA provided by wife   -patient and family education on proper guarding techniques when ambulating with SPC  -must use gait belt, stand on L side, L hand on anterior L shoulder (per patient request, though not totally necessary), energy conservation with less restrictive device  PATIENT EDUCATION: Education details: ambulating with SPC + assist, adding structure to day Person educated: Patient and Spouse Education method: Medical illustrator Education comprehension: verbalized understanding and needs further education  HOME EXERCISE PROGRAM: You Can Walk For A Certain Length Of Time Each Day                          Walk 2 minutes 1 times per day.             Increase 2  minutes every 3 days              Work up to 30 minutes (1-2 times per day).  Example:                         Day 1-2           4-5 minutes     3 times per day                         Day 7-8           10-12 minutes 2-3 times per day                         Day 13-14       20-22 minutes 1-2 times per day  Oculomotor: Saccades    Holding two targets positioned side by side __6__ inches apart, move eyes quickly from target to target as head stays still.  Perform sitting. Repeat __10__ times per session. Do __3__ sessions per day. GOALS: Goals reviewed with patient? Yes  SHORT TERM GOALS: Target date: 09/26/2022  Patient  will demonstrate 100% compliance with initial HEP to continue to progress between physical therapy sessions.   Baseline: To be provided Goal status: INITIAL  2.  Patient will improve their 5x Sit to Stand score to less than 18 seconds to demonstrate a decreased risk for falls and improved LE strength.   Baseline: 22.45" without UE support/unsteady WBOS Goal status: INITIAL  3.  Patient will improve TUG score to 17 seconds or less with LRAD to indicate a decreased risk of falls and demonstrate improved overall mobility.  Baseline: 21.27" with RW Goal status: INITIAL  4.  Patient will improve gait speed to 0.87 m/s to indicate improvement to the level of community ambulator in order to participate more easily in activities outside of the home such as adapted golf.   Baseline: 0.77 m/s Goal status: INITIAL  5. Patient will improve BBS score to >/= 38/56 to demonstrate improved balance Baseline: 29/56 Goal status: INITIAL  LONG TERM GOALS: Target date: 10/24/2022  Patient will report demonstrate independence with final HEP in order to maintain current gains and continue to progress after physical therapy discharge.   Baseline: To be provided Goal status: INITIAL  2.   Patient will improve their 5x Sit to Stand score to 15 seconds to demonstrate a decreased risk for falls and improved LE strength.   Baseline: 22.45" without UE support/unsteady WBOS Goal status: INITIAL  3.  Patient will improve TUG score to 15 seconds or less with LRAD to indicate a decreased risk of falls and demonstrate improved overall mobility.  Baseline: 21.27" with RW Goal status: INITIAL  4.  Patient will improve gait speed to 1.0 m/s to indicate improvement to the level of community ambulator in order to participate more easily in activities outside of the home such as adapted golf.   Baseline: 0.77 m/s Goal status: INITIAL  5.  Patient will improve FOTO score to 57 to achieve predicted improvements in  functional mobility due to skilled physical therapy interventions to increase safety with and participation in daily activities. Baseline: 49 Goal status: INITIAL  6.  Patient will improve BBS score to >/= 47/56 to demonstrate improved balance Baseline: 29/56 Goal status: INITIAL  ASSESSMENT:  CLINICAL IMPRESSION: Patient seen for skilled PT session with emphasis on gait training with various devices. Patient stating that he's not doing his HEP, but does not appreciate reminders from his wife. PT advising patient to make daily to-do  lists or other forms of structure into his day to allow autonomy in completing his multi-disciplinary HEPs. Patient with normalized gait pattern using SPC vs RW with less deliberate, but still safe gait pattern. Wife able to and comfortable with guarding using this device as well. He does demonstrate intermittent and mild R lateral sway, but able to correct independently without overt LOB. Instructed wife on how to safely guard should patient lose his balance. Patient and wife verbalized understanding. Continue POC.    OBJECTIVE IMPAIRMENTS: Abnormal gait, cardiopulmonary status limiting activity, decreased activity tolerance, decreased balance, decreased cognition, decreased coordination, decreased endurance, decreased knowledge of condition, decreased mobility, difficulty walking, decreased ROM, decreased strength, and impaired tone.   ACTIVITY LIMITATIONS: carrying, lifting, bending, sitting, standing, squatting, stairs, transfers, reach over head, locomotion level, and caring for others  PARTICIPATION LIMITATIONS: cleaning, driving, shopping, community activity, occupation, and yard work, Systems analyst  PERSONAL FACTORS: Past/current experiences and 3+ comorbidities: see above  are also affecting patient's functional outcome, reliance on others for transportation   REHAB POTENTIAL: Good - may be limited by complex medical history  CLINICAL DECISION MAKING:  Evolving/moderate complexity  EVALUATION COMPLEXITY: Moderate  PLAN:  PT FREQUENCY: 2x/week  PT DURATION: 8 weeks  PLANNED INTERVENTIONS: Therapeutic exercises, Therapeutic activity, Neuromuscular re-education, Balance training, Gait training, Patient/Family education, Self Care, Joint mobilization, Stair training, Vestibular training, DME instructions, Wheelchair mobility training, Manual therapy, and Re-evaluation  PLAN FOR NEXT SESSION: monitor vitals/orthostatic hypotension, add to HEP for balance, high intensity gait training as tolerated, balance, integrate golfing concepts as able, add in simple dual task to gait with SPC, vision exercises  Westley Foots, PT, DPT, CBIS 09/04/2022, 9:43 AM

## 2022-09-08 ENCOUNTER — Encounter: Payer: Self-pay | Admitting: Registered Nurse

## 2022-09-08 ENCOUNTER — Encounter: Payer: BC Managed Care – PPO | Attending: Registered Nurse | Admitting: Registered Nurse

## 2022-09-08 VITALS — BP 108/78 | Ht 75.0 in | Wt 186.0 lb

## 2022-09-08 DIAGNOSIS — I639 Cerebral infarction, unspecified: Secondary | ICD-10-CM | POA: Insufficient documentation

## 2022-09-08 DIAGNOSIS — E7849 Other hyperlipidemia: Secondary | ICD-10-CM | POA: Diagnosis not present

## 2022-09-08 DIAGNOSIS — Z5181 Encounter for therapeutic drug level monitoring: Secondary | ICD-10-CM | POA: Diagnosis not present

## 2022-09-08 DIAGNOSIS — G894 Chronic pain syndrome: Secondary | ICD-10-CM

## 2022-09-08 DIAGNOSIS — Z79899 Other long term (current) drug therapy: Secondary | ICD-10-CM | POA: Diagnosis not present

## 2022-09-08 MED ORDER — OXYCODONE HCL 5 MG PO TABS: 5.0000 mg | ORAL_TABLET | Freq: Two times a day (BID) | ORAL | 0 refills | Status: DC | PRN

## 2022-09-08 NOTE — Progress Notes (Signed)
Subjective:    Patient ID: Christopher Yu, male    DOB: July 07, 1960, 62 y.o.   MRN: [id number]  HPI: Christopher Yu is a 62 y.o. male who is here for Transitional Care visit for follow up of his Cerebellar Stroke and Hyperlipidemia.  He presented to [facility] on 07/28/2022, with complaints of slurred speech and gait difficulty.   Dr.Opyd H&P Note: 07/28/2022  HPI: Christopher Yu is a 62 y.o. male with medical history significant for rheumatoid arthritis, depression, CAD, OSA with CPAP intolerance, chronic systolic CHF, AVM resection in December 2023, and repair of pseudomeningocele on 07/18/2022, presenting to the emergency department with gait difficulty and slurred speech.   Patient's wife is at the bedside and assists with the history.  Patient reportedly had some dysarthria while still in the hospital that was attributed to sedating medications.  Dysarthria was noted to have worsened significantly on 07/26/2022 and did not improve when his wife withheld his analgesics.  He has also had increasing difficulty with gait and balance over the past 3 days.  CT Head WO Contrast: IMPRESSION: 1. 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 New pneumocephalus in the resection cavity, likely secondary to recent pseudomeningocele repair and lumbar drain placement/removal. No acute intracranial hemorrhage. 2. Partially imaged fluid collection in the dorsal soft tissues overlying the suboccipital craniectomy, likely residual or recurrent pseudomeningocele.  MR Brain WO Contrast:  IMPRESSION: 1. New small focus of acute infarction in the right superior cerebellar hemisphere with surrounding area of increased T2 signal and cystic change, which is new since the prior MRI dated 07/05/2022 and suspicious for now chronic infarct. 2. Postoperative changes of prior left  posterior fossa resection. Redemonstrated foci of gas within the resection cavity, fourth ventricle and extra-axial spaces, likely secondary to recent pseudomeningocele repair and/or lumbar drain placement/removal. 3. Decreased size of the fluid collection in the dorsal soft tissues overlying the suboccipital craniectomy.  CTA: Head and Neck IMPRESSION: 1. No vascular lesion seen underlying the cerebellar infarct. 2. Chronic 60% narrowing of the left vertebral artery due to extrinsic compression by C5-6 left facet osteophyte. 3. Limited atheromatous change for age.   Neurology and Neurosurgery Consulted.   Christopher Yu was admitted to inpatient rehabilitation on 07/31/2022 and discharged home on 08/26/2022. He is receiving outpatient therapy at [facility]. He reports he has a chronic headache. He rates his pain 4. He is walking with walker.   Christopher Yu Morphine equivalent is 15.00 MME.  Oral Swab was Performed today.   Christopher Yu states " last Thursday after Christopher Yu had physical therapy she noticed he was SOB and reports he had bradycardia. She didn't call his PCP or Cardiologist regarding the above. This was discussed with her. To call and scheduled appointment with Cardiology he has a scheduled appointment with PCP. Christopher Yu denies SOB at this time and refuses ED evaluation at this time. We will continue to monitor. Christopher Yu understanding.     Christopher Yu had question regarding Long QT level, all questions answered. Also Dr Shearon Stalls spoke with them today, all questions answered.   Pain Inventory Average Pain 4 Pain Right Now 4 My pain is constant and dull  LOCATION OF PAIN  head  BOWEL Number of stools per week: 1-2 Oral laxative use Yes  Type of laxative Miralax  BLADDER Normal    Mobility walk with assistance use a walker how many minutes  can you walk? 5 ability to climb steps?  yes do you drive?  no needs help  with transfers Do you have any goals in this area?  yes  Function disabled: date disabled 5 minutes I need assistance with the following:  dressing, bathing, toileting, meal prep, household duties, and shopping Do you have any goals in this area?  yes  Neuro/Psych tremor trouble walking dizziness depression anxiety  Prior Studies Any changes since last visit?  yes CT 2 days after discharge at [facility]  Physicians involved in your care Any changes since last visit?  no   Family History  Problem Relation Age of Onset   Lupus Sister    Social History   Socioeconomic History   Marital status: Married    Spouse name: Alyn Loken   Number of children: 4   Years of education: Not on file   Highest education level: Not on file  Occupational History   Not on file  Tobacco Use   Smoking status: Former    Packs/day: 0.50    Years: 20.00    Additional pack years: 0.00    Total pack years: 10.00    Types: Cigarettes    Quit date: 05/24/2008    Years since quitting: 14.3   Smokeless tobacco: Never   Tobacco comments:    stopped smoking 8 yrs ago.  Vaping Use   Vaping Use: Never used  Substance and Sexual Activity   Alcohol use: Yes    Comment: rarely   Drug use: No   Sexual activity: Not Currently  Other Topics Concern   Not on file  Social History Narrative   Not on file   Social Determinants of Health   Financial Resource Strain: Not on file  Food Insecurity: No Food Insecurity (07/29/2022)   Hunger Vital Sign    Worried About Running Out of Food in the Last Year: Never true    Ran Out of Food in the Last Year: Never true  Transportation Needs: No Transportation Needs (07/29/2022)   PRAPARE - Administrator, Civil Service (Medical): No    Lack of Transportation (Non-Medical): No  Physical Activity: Not on file  Stress: Not on file  Social Connections: Not on file   Past Surgical History:  Procedure Laterality Date   ANKLE FUSION Right  05/05/2012   related to his arthritis   ANKLE FUSION Left 05/25/2013   related to his arthritis   ANKLE SURGERY Bilateral    APPLICATION OF CRANIAL NAVIGATION N/A 04/11/2022   Procedure: APPLICATION OF CRANIAL NAVIGATION;  Surgeon: Lisbeth Renshaw, MD;  Location: [facility] OR;  Service: Neurosurgery;  Laterality: N/A;   APPLICATION OF CRANIAL NAVIGATION N/A 08/08/2022   Procedure: APPLICATION OF CRANIAL NAVIGATION;  Surgeon: Lisbeth Renshaw, MD;  Location: [facility] OR;  Service: Neurosurgery;  Laterality: N/A;   CARDIAC CATHETERIZATION N/A 05/01/2015   Procedure: Left Heart Cath and Coronary Angiography;  Surgeon: Lyn Records, MD;  Location: [facility] INVASIVE CV LAB;  Service: Cardiovascular;  Laterality: N/A;   CRANIOTOMY N/A 04/11/2022   Procedure: STEREOTACTIC SUBOCCIPITAL CRANIOTOMY FOR RESECTION OF ARTERIO-VENOUS MALFORMATION;  Surgeon: Lisbeth Renshaw, MD;  Location: [facility] OR;  Service: Neurosurgery;  Laterality: N/A;   FRACTURE SURGERY     left femur fracture x 3   IR ANGIO EXTERNAL CAROTID SEL EXT CAROTID BILAT MOD SED  01/28/2022   IR ANGIO INTRA EXTRACRAN SEL INTERNAL CAROTID BILAT MOD SED  01/28/2022   IR ANGIO INTRA EXTRACRAN SEL INTERNAL CAROTID BILAT  MOD SED  04/16/2022   IR ANGIO VERTEBRAL SEL VERTEBRAL BILAT MOD SED  01/28/2022   IR ANGIO VERTEBRAL SEL VERTEBRAL BILAT MOD SED  04/16/2022   KNEE ARTHROSCOPY     right x 4   LAPAROSCOPIC REVISION VENTRICULAR-PERITONEAL (V-P) SHUNT N/A 08/08/2022   Procedure: LAPAROSCOPIC VENTRICULAR-PERITONEAL (V-P) SHUNT;  Surgeon: Harriette Bouillon, MD;  Location: [facility] OR;  Service: General;  Laterality: N/A;   LUMBAR LAMINECTOMY/DECOMPRESSION MICRODISCECTOMY N/A 07/18/2022   Procedure: Repair of Pseudomeningiocele posterior;  Surgeon: Lisbeth Renshaw, MD;  Location: [facility] OR;  Service: Neurosurgery;  Laterality: N/A;   NASAL SEPTOPLASTY W/ TURBINOPLASTY  06/25/2011   Procedure: NASAL SEPTOPLASTY WITH TURBINATE REDUCTION;  Surgeon: Osborn Coho, MD;   Location: [facility] OR;  Service: ENT;  Laterality: Bilateral;   PLACEMENT OF LUMBAR DRAIN N/A 07/18/2022   Procedure: PLACEMENT OF LUMBAR DRAIN;  Surgeon: Lisbeth Renshaw, MD;  Location: [facility] OR;  Service: Neurosurgery;  Laterality: N/A;   TONSILLECTOMY     TOTAL KNEE ARTHROPLASTY Right 06/01/2015   Procedure: RIGHT TOTAL KNEE ARTHROPLASTY;  Surgeon: Kathryne Hitch, MD;  Location: [facility] ORS;  Service: Orthopedics;  Laterality: Right;  Block+general   VENTRICULOPERITONEAL SHUNT Right 08/08/2022   Procedure: LAP ASSITED SHUNT INSERTION VENTRICULAR-PERITONEAL;  Surgeon: Lisbeth Renshaw, MD;  Location: [facility] OR;  Service: Neurosurgery;  Laterality: Right;   Past Medical History:  Diagnosis Date   Anxiety    Arthritis, rheumatoid (HCC)    dx 1988   CAD (coronary artery disease), native coronary artery    Mild LAD disease with calcification noted at cath 12//27/16    Cardiomyopathy (HCC)    Cardiomyopathy (HCC)    Chronic systolic heart failure (HCC) 05/01/2015   Depression    Deviated nasal septum 06/25/2011   Headache    High cholesterol    Hyperlipidemia    Impingement syndrome of left shoulder 12/22/2017   Macular degeneration    Nausea and vomiting 08/28/2022   Rheumatoid aortitis    Sleep apnea    does not use cpap - UNABLE TO TOLERATE MASK   Sleep apnea in adult    deviated septum repaired, most recent sleep study was negative   Status post total right knee replacement 06/01/2015   BP 108/78   Ht 6\' 3"  (1.905 m)   Wt 186 lb (84.4 kg)   SpO2 96%   BMI 23.25 kg/m   Opioid Risk Score:   Fall Risk Score:  `1  Depression screen PHQ 2/9      No data to display          Review of Systems  Constitutional:  Positive for chills.       Weight loss  Respiratory:  Positive for apnea and shortness of breath. Negative for cough.   Gastrointestinal:  Positive for constipation.  Genitourinary:  Positive for frequency.  Neurological:        Cough with swallowing   All other  systems reviewed and are negative.     Objective:   Physical Exam Vitals and nursing note reviewed.  Constitutional:      Appearance: Normal appearance.  Cardiovascular:     Rate and Rhythm: Normal rate and regular rhythm.     Pulses: Normal pulses.     Heart sounds: Normal heart sounds.  Pulmonary:     Effort: Pulmonary effort is normal.     Breath sounds: Normal breath sounds.  Musculoskeletal:     Cervical back: Normal range of motion and neck supple.     Comments:  Normal Muscle Bulk and Muscle Testing Reveals:  Upper Extremities: Full ROM and Muscle Strength 5/5  Lower Extremities: Full ROM and Muscle Strength 5/5 Arises from Table slowly using walker for support Narrow Based  Gait     Skin:    General: Skin is warm and dry.  Neurological:     Mental Status: He is alert and oriented to person, place, and time.  Psychiatric:        Mood and Affect: Mood normal.        Behavior: Behavior normal.         Assessment & Plan:  Cerebellar Stroke: Continue Outpatient Therapy at [facility]. He has a scheduled appointment with Neurosurgery. Continue to Monitor.  Hyperlipidemia: Continue current medication regimen. PCP following. Continue to monitor.  Pseudomeningocele: Neurosurgery following. Continue to Monitor.   F//U with Dr Shearon Stalls in 4-6 weeks .

## 2022-09-09 ENCOUNTER — Ambulatory Visit: Payer: BC Managed Care – PPO

## 2022-09-09 ENCOUNTER — Encounter: Payer: Self-pay | Admitting: Registered Nurse

## 2022-09-09 DIAGNOSIS — M6281 Muscle weakness (generalized): Secondary | ICD-10-CM

## 2022-09-09 DIAGNOSIS — R42 Dizziness and giddiness: Secondary | ICD-10-CM | POA: Diagnosis not present

## 2022-09-09 DIAGNOSIS — R41840 Attention and concentration deficit: Secondary | ICD-10-CM | POA: Diagnosis not present

## 2022-09-09 DIAGNOSIS — R269 Unspecified abnormalities of gait and mobility: Secondary | ICD-10-CM | POA: Diagnosis not present

## 2022-09-09 DIAGNOSIS — R2681 Unsteadiness on feet: Secondary | ICD-10-CM

## 2022-09-09 DIAGNOSIS — R278 Other lack of coordination: Secondary | ICD-10-CM

## 2022-09-09 DIAGNOSIS — R262 Difficulty in walking, not elsewhere classified: Secondary | ICD-10-CM

## 2022-09-09 DIAGNOSIS — R1310 Dysphagia, unspecified: Secondary | ICD-10-CM | POA: Diagnosis not present

## 2022-09-09 DIAGNOSIS — R41842 Visuospatial deficit: Secondary | ICD-10-CM

## 2022-09-09 DIAGNOSIS — R471 Dysarthria and anarthria: Secondary | ICD-10-CM | POA: Diagnosis not present

## 2022-09-09 NOTE — Therapy (Signed)
OUTPATIENT PHYSICAL THERAPY NEURO TREATMENT   Patient Name: Christopher Yu MRN: [id number] DOB:04/23/1961, 62 y.o., male Today's Date: 09/09/2022   PCP: Thana Ates, MD REFERRING PROVIDER: Charlton Amor, PA-C  END OF SESSION:  PT End of Session - 09/09/22 0921     Visit Number 4    Number of Visits 17    Date for PT Re-Evaluation 11/07/22    Authorization Type [employer]    PT Start Time 0930    PT Stop Time 1015    PT Time Calculation (min) 45 min    Equipment Utilized During Treatment Gait belt    Activity Tolerance Patient tolerated treatment well    Behavior During Therapy WFL for tasks assessed/performed;Flat affect             Past Medical History:  Diagnosis Date   Anxiety    Arthritis, rheumatoid (HCC)    dx 1988   CAD (coronary artery disease), native coronary artery    Mild LAD disease with calcification noted at cath 12//27/16    Cardiomyopathy (HCC)    Cardiomyopathy (HCC)    Chronic systolic heart failure (HCC) 05/01/2015   Depression    Deviated nasal septum 06/25/2011   Headache    High cholesterol    Hyperlipidemia    Impingement syndrome of left shoulder 12/22/2017   Macular degeneration    Nausea and vomiting 08/28/2022   Rheumatoid aortitis    Sleep apnea    does not use cpap - UNABLE TO TOLERATE MASK   Sleep apnea in adult    deviated septum repaired, most recent sleep study was negative   Status post total right knee replacement 06/01/2015   Past Surgical History:  Procedure Laterality Date   ANKLE FUSION Right 05/05/2012   related to his arthritis   ANKLE FUSION Left 05/25/2013   related to his arthritis   ANKLE SURGERY Bilateral    APPLICATION OF CRANIAL NAVIGATION N/A 04/11/2022   Procedure: APPLICATION OF CRANIAL NAVIGATION;  Surgeon: Lisbeth Renshaw, MD;  Location: [facility] OR;  Service: Neurosurgery;  Laterality: N/A;   APPLICATION OF CRANIAL NAVIGATION N/A 08/08/2022   Procedure: APPLICATION OF CRANIAL NAVIGATION;   Surgeon: Lisbeth Renshaw, MD;  Location: [facility] OR;  Service: Neurosurgery;  Laterality: N/A;   CARDIAC CATHETERIZATION N/A 05/01/2015   Procedure: Left Heart Cath and Coronary Angiography;  Surgeon: Lyn Records, MD;  Location: [facility] INVASIVE CV LAB;  Service: Cardiovascular;  Laterality: N/A;   CRANIOTOMY N/A 04/11/2022   Procedure: STEREOTACTIC SUBOCCIPITAL CRANIOTOMY FOR RESECTION OF ARTERIO-VENOUS MALFORMATION;  Surgeon: Lisbeth Renshaw, MD;  Location: [facility] OR;  Service: Neurosurgery;  Laterality: N/A;   FRACTURE SURGERY     left femur fracture x 3   IR ANGIO EXTERNAL CAROTID SEL EXT CAROTID BILAT MOD SED  01/28/2022   IR ANGIO INTRA EXTRACRAN SEL INTERNAL CAROTID BILAT MOD SED  01/28/2022   IR ANGIO INTRA EXTRACRAN SEL INTERNAL CAROTID BILAT MOD SED  04/16/2022   IR ANGIO VERTEBRAL SEL VERTEBRAL BILAT MOD SED  01/28/2022   IR ANGIO VERTEBRAL SEL VERTEBRAL BILAT MOD SED  04/16/2022   KNEE ARTHROSCOPY     right x 4   LAPAROSCOPIC REVISION VENTRICULAR-PERITONEAL (V-P) SHUNT N/A 08/08/2022   Procedure: LAPAROSCOPIC VENTRICULAR-PERITONEAL (V-P) SHUNT;  Surgeon: Harriette Bouillon, MD;  Location: [facility] OR;  Service: General;  Laterality: N/A;   LUMBAR LAMINECTOMY/DECOMPRESSION MICRODISCECTOMY N/A 07/18/2022   Procedure: Repair of Pseudomeningiocele posterior;  Surgeon: Lisbeth Renshaw, MD;  Location: [facility] OR;  Service: Neurosurgery;  Laterality: N/A;   NASAL SEPTOPLASTY W/ TURBINOPLASTY  06/25/2011   Procedure: NASAL SEPTOPLASTY WITH TURBINATE REDUCTION;  Surgeon: Osborn Coho, MD;  Location: [facility];  Service: ENT;  Laterality: Bilateral;   PLACEMENT OF LUMBAR DRAIN N/A 07/18/2022   Procedure: PLACEMENT OF LUMBAR DRAIN;  Surgeon: Lisbeth Renshaw, MD;  Location: [facility] OR;  Service: Neurosurgery;  Laterality: N/A;   TONSILLECTOMY     TOTAL KNEE ARTHROPLASTY Right 06/01/2015   Procedure: RIGHT TOTAL KNEE ARTHROPLASTY;  Surgeon: Kathryne Hitch, MD;  Location: [facility] ORS;  Service: Orthopedics;   Laterality: Right;  Block+general   VENTRICULOPERITONEAL SHUNT Right 08/08/2022   Procedure: LAP ASSITED SHUNT INSERTION VENTRICULAR-PERITONEAL;  Surgeon: Lisbeth Renshaw, MD;  Location: [facility] OR;  Service: Neurosurgery;  Laterality: Right;   Patient Active Problem List   Diagnosis Date Noted   Nausea and vomiting 08/28/2022   Adjustment disorder 08/06/2022   Protein-calorie malnutrition, severe 07/31/2022   Cerebellar stroke (HCC) 07/29/2022   Ischemic cerebrovascular accident (CVA) (HCC) 07/28/2022   Prolonged QT interval 07/28/2022   Pseudomeningocele 07/18/2022   Pseudomeningocele due to surgical procedure 07/18/2022   Status post craniotomy 04/11/2022   AVM (arteriovenous malformation) brain 04/11/2022   Impingement syndrome of left shoulder 12/22/2017   Impingement syndrome of right shoulder 12/22/2017   Rheumatoid arthritis involving right knee (HCC) 06/01/2015   Status post total right knee replacement 06/01/2015   Cardiomyopathy (HCC) 05/02/2015   Hyperlipidemia    Sleep apnea in adult    Rheumatoid arthritis (HCC)    Chronic systolic heart failure (HCC) 05/01/2015   CAD (coronary artery disease), native coronary artery     ONSET DATE: 08/21/2022 (referral date)  REFERRING DIAG: I63.9 (ICD-10-CM) - Cerebral infarction, unspecified  THERAPY DIAG:  Abnormality of gait and mobility  Other lack of coordination  Dizziness and giddiness  Unsteadiness on feet  Muscle weakness (generalized)  Difficulty in walking, not elsewhere classified  Rationale for Evaluation and Treatment: Rehabilitation  SUBJECTIVE:                                                                                                                                                                                             SUBJECTIVE STATEMENT: Patient arrives with wife, ambulating with RW. Ordered a cane over the weekend. Has been walking a little bit more at home, but wife and patient report SOB  and DOE. Per wife, HR was in the 20s and O2 in the high 80s/low 90s at one point.   Pt accompanied by: self and significant other - wife Christopher Yu  PERTINENT HISTORY: cerebellar AVM resection 12/23, pseudomeningocele s/p craniotomy 3/24, acute R  cerebellar infarct, hydrocephalus and VP shunt was placed on 08/08/22, dysphagia, dysarthria, aphasia  PMH: rheumatoid arthritis, bilateral ankle fusion (2014 and 2015), cardiomyopathy, CAD, R TKA, cardiomyopathy, chronic systolic heart failure, macular degeneration, OSA on CPAP  PAIN:  Are you having pain? No  PRECAUTIONS: Fall and Other: swallow precautions with modification with left head tilt and supervision if drinking thinned liquids, per caregiver not to lift, history of orthostatic hypotension resulting in falls  PATIENT GOALS: "To get back to playing golf and moving like I used to."  OBJECTIVE:   DIAGNOSTIC FINDINGS:   CT on 08/27/2022: IMPRESSION: 1. No evidence of acute infarct or hemorrhage. 2. Stable right-sided ventriculostomy catheter, tip within the right lateral ventricle. Stable size of the lateral ventricles without hydrocephalus. 3. Postsurgical changes from prior suboccipital craniectomy, with underlying encephalomalacia in the cerebellum. The pseudomeningocele seen on prior study has increased slightly in size since prior exam, of uncertain significance. 4. Decreased size of the right superior cerebellar intraparenchymal cyst, with decreased mass effect upon the fourth ventricle.  MR Brain on 07/28/2022: IMPRESSION: 1. New small focus of acute infarction in the right superior cerebellar hemisphere with surrounding area of increased T2 signal and cystic change, which is new since the prior MRI dated 07/05/2022 and suspicious for now chronic infarct. 2. Postoperative changes of prior left posterior fossa resection. Redemonstrated foci of gas within the resection cavity, fourth ventricle and extra-axial spaces, likely  secondary to recent pseudomeningocele repair and/or lumbar drain placement/removal. 3. Decreased size of the fluid collection in the dorsal soft tissues overlying the suboccipital craniectomy.  TODAY'S TREATMENT:                                                                                                                              GAIT: -vitals at baseline: 97% O2, 102BPM -115' overground gait with RW + close supervision   -O2: 96%, HR: 116BPM -Treadmill with BWS harness for fall precaution  -working up to 1. with B UE support   -patient requesting to stop after 3.8mins due to fear of R knee buckling   -additional 3.5 mins at 1. attempting to lessen reliance on R UE    -patient very resistant to this  PATIENT EDUCATION: Education details: continue HEP and ambulating at home Person educated: Patient and Spouse Education method: Medical illustrator Education comprehension: verbalized understanding and needs further education  HOME EXERCISE PROGRAM: You Can Walk For A Certain Length Of Time Each Day                          Walk 2 minutes 1 times per day.             Increase 2  minutes every 3 days              Work up to 30 minutes (1-2 times per day).  Example:                         Day 1-2           4-5 minutes     3 times per day                         Day 7-8           10-12 minutes 2-3 times per day                         Day 13-14       20-22 minutes 1-2 times per day  Oculomotor: Saccades    Holding two targets positioned side by side __6__ inches apart, move eyes quickly from target to target as head stays still.  Perform sitting. Repeat __10__ times per session. Do __3__ sessions per day. GOALS: Goals reviewed with patient? Yes  SHORT TERM GOALS: Target date: 09/26/2022  Patient will demonstrate 100% compliance with initial HEP to continue to progress between physical therapy sessions.   Baseline: To be provided Goal  status: INITIAL  2.  Patient will improve their 5x Sit to Stand score to less than 18 seconds to demonstrate a decreased risk for falls and improved LE strength.   Baseline: 22.45" without UE support/unsteady WBOS Goal status: INITIAL  3.  Patient will improve TUG score to 17 seconds or less with LRAD to indicate a decreased risk of falls and demonstrate improved overall mobility.  Baseline: 21.27" with RW Goal status: INITIAL  4.  Patient will improve gait speed to 0.87 m/s to indicate improvement to the level of community ambulator in order to participate more easily in activities outside of the home such as adapted golf.   Baseline: 0.77 m/s Goal status: INITIAL  5. Patient will improve BBS score to >/= 38/56 to demonstrate improved balance Baseline: 29/56 Goal status: INITIAL  LONG TERM GOALS: Target date: 10/24/2022  Patient will report demonstrate independence with final HEP in order to maintain current gains and continue to progress after physical therapy discharge.   Baseline: To be provided Goal status: INITIAL  2.   Patient will improve their 5x Sit to Stand score to 15 seconds to demonstrate a decreased risk for falls and improved LE strength.   Baseline: 22.45" without UE support/unsteady WBOS Goal status: INITIAL  3.  Patient will improve TUG score to 15 seconds or less with LRAD to indicate a decreased risk of falls and demonstrate improved overall mobility.  Baseline: 21.27" with RW Goal status: INITIAL  4.  Patient will improve gait speed to 1.0 m/s to indicate improvement to the level of community ambulator in order to participate more easily in activities outside of the home such as adapted golf.   Baseline: 0.77 m/s Goal status: INITIAL  5.  Patient will improve FOTO score to 57 to achieve predicted improvements in functional mobility due to skilled physical therapy interventions to increase safety with and participation in daily activities. Baseline: 49 Goal  status: INITIAL  6.  Patient will improve BBS score to >/= 47/56 to demonstrate improved balance Baseline: 29/56 Goal status: INITIAL  ASSESSMENT:  CLINICAL IMPRESSION: Patient seen for skilled PT session with emphasis on high intensity gait training. Patient with no significant DOE with gait tasks today, but will report that after ambulating 72ft at home, he'll feel SOB. This is likely  more related to limited endurance and perhaps dual task overlay when at home. Patient very resistant to not using R UE when ambulating on treadmill, but continues to report feeling more comfortable ambulating with SPC > RW. He will continue to benefit from education on this. Continue POC.    OBJECTIVE IMPAIRMENTS: Abnormal gait, cardiopulmonary status limiting activity, decreased activity tolerance, decreased balance, decreased cognition, decreased coordination, decreased endurance, decreased knowledge of condition, decreased mobility, difficulty walking, decreased ROM, decreased strength, and impaired tone.   ACTIVITY LIMITATIONS: carrying, lifting, bending, sitting, standing, squatting, stairs, transfers, reach over head, locomotion level, and caring for others  PARTICIPATION LIMITATIONS: cleaning, driving, shopping, community activity, occupation, and yard work, Systems analyst  PERSONAL FACTORS: Past/current experiences and 3+ comorbidities: see above  are also affecting patient's functional outcome, reliance on others for transportation   REHAB POTENTIAL: Good - may be limited by complex medical history  CLINICAL DECISION MAKING: Evolving/moderate complexity  EVALUATION COMPLEXITY: Moderate  PLAN:  PT FREQUENCY: 2x/week  PT DURATION: 8 weeks  PLANNED INTERVENTIONS: Therapeutic exercises, Therapeutic activity, Neuromuscular re-education, Balance training, Gait training, Patient/Family education, Self Care, Joint mobilization, Stair training, Vestibular training, DME instructions, Wheelchair mobility training,  Manual therapy, and Re-evaluation  PLAN FOR NEXT SESSION: monitor vitals/orthostatic hypotension, add to HEP for balance, high intensity gait training, balance, integrate golfing concepts as able, add in simple dual task to gait with SPC, vision exercises  Westley Foots, PT, DPT, CBIS 09/09/2022, 10:28 AM

## 2022-09-09 NOTE — Therapy (Signed)
OUTPATIENT OCCUPATIONAL THERAPY NEURO TREATMENT  Patient Name: Christopher Yu MRN: [id number] DOB:10/25/1960, 62 y.o., male Today's Date: 09/09/2022  PCP: Thana Ates, MD REFERRING PROVIDER: Charlton Amor, PA-C  END OF SESSION:  OT End of Session - 09/09/22 1016     Visit Number 3    Number of Visits 20    Date for OT Re-Evaluation 06/20/22    Authorization Type [employer]    OT Start Time 1016    OT Stop Time 1058    OT Time Calculation (min) 42 min    Activity Tolerance Patient tolerated treatment well    Behavior During Therapy WFL for tasks assessed/performed;Flat affect             Past Medical History:  Diagnosis Date   Anxiety    Arthritis, rheumatoid (HCC)    dx 1988   CAD (coronary artery disease), native coronary artery    Mild LAD disease with calcification noted at cath 12//27/16    Cardiomyopathy (HCC)    Cardiomyopathy (HCC)    Chronic systolic heart failure (HCC) 05/01/2015   Depression    Deviated nasal septum 06/25/2011   Headache    High cholesterol    Hyperlipidemia    Impingement syndrome of left shoulder 12/22/2017   Macular degeneration    Nausea and vomiting 08/28/2022   Rheumatoid aortitis    Sleep apnea    does not use cpap - UNABLE TO TOLERATE MASK   Sleep apnea in adult    deviated septum repaired, most recent sleep study was negative   Status post total right knee replacement 06/01/2015   Past Surgical History:  Procedure Laterality Date   ANKLE FUSION Right 05/05/2012   related to his arthritis   ANKLE FUSION Left 05/25/2013   related to his arthritis   ANKLE SURGERY Bilateral    APPLICATION OF CRANIAL NAVIGATION N/A 04/11/2022   Procedure: APPLICATION OF CRANIAL NAVIGATION;  Surgeon: Lisbeth Renshaw, MD;  Location: [facility] OR;  Service: Neurosurgery;  Laterality: N/A;   APPLICATION OF CRANIAL NAVIGATION N/A 08/08/2022   Procedure: APPLICATION OF CRANIAL NAVIGATION;  Surgeon: Lisbeth Renshaw, MD;  Location: [facility] OR;   Service: Neurosurgery;  Laterality: N/A;   CARDIAC CATHETERIZATION N/A 05/01/2015   Procedure: Left Heart Cath and Coronary Angiography;  Surgeon: Lyn Records, MD;  Location: [facility] INVASIVE CV LAB;  Service: Cardiovascular;  Laterality: N/A;   CRANIOTOMY N/A 04/11/2022   Procedure: STEREOTACTIC SUBOCCIPITAL CRANIOTOMY FOR RESECTION OF ARTERIO-VENOUS MALFORMATION;  Surgeon: Lisbeth Renshaw, MD;  Location: [facility] OR;  Service: Neurosurgery;  Laterality: N/A;   FRACTURE SURGERY     left femur fracture x 3   IR ANGIO EXTERNAL CAROTID SEL EXT CAROTID BILAT MOD SED  01/28/2022   IR ANGIO INTRA EXTRACRAN SEL INTERNAL CAROTID BILAT MOD SED  01/28/2022   IR ANGIO INTRA EXTRACRAN SEL INTERNAL CAROTID BILAT MOD SED  04/16/2022   IR ANGIO VERTEBRAL SEL VERTEBRAL BILAT MOD SED  01/28/2022   IR ANGIO VERTEBRAL SEL VERTEBRAL BILAT MOD SED  04/16/2022   KNEE ARTHROSCOPY     right x 4   LAPAROSCOPIC REVISION VENTRICULAR-PERITONEAL (V-P) SHUNT N/A 08/08/2022   Procedure: LAPAROSCOPIC VENTRICULAR-PERITONEAL (V-P) SHUNT;  Surgeon: Harriette Bouillon, MD;  Location: [facility] OR;  Service: General;  Laterality: N/A;   LUMBAR LAMINECTOMY/DECOMPRESSION MICRODISCECTOMY N/A 07/18/2022   Procedure: Repair of Pseudomeningiocele posterior;  Surgeon: Lisbeth Renshaw, MD;  Location: [facility] OR;  Service: Neurosurgery;  Laterality: N/A;   NASAL SEPTOPLASTY W/ TURBINOPLASTY  06/25/2011  Procedure: NASAL SEPTOPLASTY WITH TURBINATE REDUCTION;  Surgeon: Osborn Coho, MD;  Location: [facility] OR;  Service: ENT;  Laterality: Bilateral;   PLACEMENT OF LUMBAR DRAIN N/A 07/18/2022   Procedure: PLACEMENT OF LUMBAR DRAIN;  Surgeon: Lisbeth Renshaw, MD;  Location: [facility] OR;  Service: Neurosurgery;  Laterality: N/A;   TONSILLECTOMY     TOTAL KNEE ARTHROPLASTY Right 06/01/2015   Procedure: RIGHT TOTAL KNEE ARTHROPLASTY;  Surgeon: Kathryne Hitch, MD;  Location: [facility] ORS;  Service: Orthopedics;  Laterality: Right;  Block+general   VENTRICULOPERITONEAL  SHUNT Right 08/08/2022   Procedure: LAP ASSITED SHUNT INSERTION VENTRICULAR-PERITONEAL;  Surgeon: Lisbeth Renshaw, MD;  Location: [facility] OR;  Service: Neurosurgery;  Laterality: Right;   Patient Active Problem List   Diagnosis Date Noted   Nausea and vomiting 08/28/2022   Adjustment disorder 08/06/2022   Protein-calorie malnutrition, severe 07/31/2022   Cerebellar stroke (HCC) 07/29/2022   Ischemic cerebrovascular accident (CVA) (HCC) 07/28/2022   Prolonged QT interval 07/28/2022   Pseudomeningocele 07/18/2022   Pseudomeningocele due to surgical procedure 07/18/2022   Status post craniotomy 04/11/2022   AVM (arteriovenous malformation) brain 04/11/2022   Impingement syndrome of left shoulder 12/22/2017   Impingement syndrome of right shoulder 12/22/2017   Rheumatoid arthritis involving right knee (HCC) 06/01/2015   Status post total right knee replacement 06/01/2015   Cardiomyopathy (HCC) 05/02/2015   Hyperlipidemia    Sleep apnea in adult    Rheumatoid arthritis (HCC)    Chronic systolic heart failure (HCC) 05/01/2015   CAD (coronary artery disease), native coronary artery     ONSET DATE: 08/21/2022 (date of referral)  REFERRING DIAG: I63.9 (ICD-10-CM) - Cerebral infarction, unspecified  THERAPY DIAG:  Other lack of coordination  Unsteadiness on feet  Muscle weakness (generalized)  Visuospatial deficit  Attention and concentration deficit  Rationale for Evaluation and Treatment: Rehabilitation  SUBJECTIVE:   SUBJECTIVE STATEMENT: Pt's wife reports pt is having dry eyes, and that is the only thing really bothering him. Pt does has drops, simply needs to consistently use them.   Pt accompanied by: self and significant other, [name]  PERTINENT HISTORY: CT/MRI showed new small focus of acute infarction in the right superior cerebellar hemisphere. Cerebellar AVM resection 12/23, pseudomeningocele s/p craniotomy 3/24, acute R cerebellar infarct, hydrocephalus and VP shunt was  placed on 08/08/22, dysphagia, dysarthria, aphasia. PMH: rheumatoid arthritis, bilateral ankle fusion (2014 and 2015), cardiomyopathy, CAD, R TKA, cardiomyopathy, chronic systolic heart failure, macular degeneration, OSA on CPAP  PRECAUTIONS: Fall and Other: swallow precautions with modification with left head tilt and supervision if drinking thinned liquids, per caregiver not to lift, history of orthostatic hypotension resulting in falls; no driving  WEIGHT BEARING RESTRICTIONS: No  PAIN:  Are you having pain? No, wife reports taking oxycodone this morning. Pt reports sometimes having pain in his head.   FALLS: Has patient fallen in last 6 months? Yes. Number of falls 3 - after the first surgery, got dizzy and fell, using the walker, when up and moving, struggled with orthostatic hypotension but medical team has since decreased BP medications  LIVING ENVIRONMENT: Lives with: lives with their spouse, two teenage daughters (also has dtr who lives in [place]), and 175 lb dog Lives in: House/apartment - Able to live on main floor Stairs: Yes: Internal: ~14 steps; on right going up and External: 3 steps; on right going up Has following equipment at home: Dan Humphreys - 2 wheeled and Wheelchair (manual) Bathroom setup: [employer], regular height toilet  PLOF: Prior to December, pt completely  independent with ADL/IADLs, ambulatory without device, pt enjoys golfing, driving, 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  PATIENT GOALS: Return to golf, improve vision for work, travel in June for daughter's outdoor wedding (be off the walker ideally)  OBJECTIVE: (results from evaluation unless otherwise stated)  HAND DOMINANCE: Right  ADLs: Eating: Independent with self-feeding, wife cuts items due to impaired coordination per her report Grooming: Independent mostly, but reports it's painful to brush hair from surgical procedure in December UB Dressing: Supervision, increased time and wife present if needed LB  Dressing: Supervision increased time and wife present if needed. Sometimes sits to don pants Toileting: Supervision, more effort, wife there in case pt loses balance Bathing: Supervision for balance Tub Shower transfers: Supervision likely; however, sponge bath currently due to hand held shower head breaking and plumber coming 5/1.  Equipment:  Toilet riser with armrests , shower chair, bedrail on bed  IADLs: Shopping: Currently unable, previously independent Light housekeeping: Currently unable, previously did laundry Meal Prep: Currently unable, but previously assisted in task Community mobility: and household, pt utilizing rolling walker at all times. Typically sedentary in recliner most of the day. Gets up for bathroom.  Medication management: Wife Presenter, broadcasting: Currently unable, pt typically did before Handwriting: 75% legible and Mild micrographia   MOBILITY STATUS:  Pt ambulatory with RW, wife providing supervision  at all times  POSTURE COMMENTS:  rounded shoulders Sitting balance: Moves/returns truncal midpoint 1-2 inches in multiple planes - Wife describes she believes it is more of a coordination issue, overshooting and then losing balance.   ACTIVITY TOLERANCE: Activity tolerance: Subjective report of significant fatigue  FUNCTIONAL OUTCOME MEASURES: FOTO: 58 score  UPPER EXTREMITY ROM:    Active ROM Right eval Left eval  Shoulder flexion [facility]  Shoulder abduction [facility]  Shoulder adduction [facility] WFL  Shoulder extension [facility]  Shoulder internal rotation    Shoulder external rotation    Elbow flexion WFL WFL  Elbow extension [facility] WFL  Wrist flexion    Wrist extension    Wrist ulnar deviation    Wrist radial deviation    Wrist pronation    Wrist supination    (Blank rows = not tested)  UPPER EXTREMITY MMT:     MMT Right eval Left eval  Shoulder flexion 4+/5 4+/5  Shoulder abduction    Shoulder adduction    Shoulder extension     Shoulder internal rotation    Shoulder external rotation    Middle trapezius    Lower trapezius    Elbow flexion 4+/5 4+/5  Elbow extension 4+/5 4+/5  Wrist flexion    Wrist extension    Wrist ulnar deviation    Wrist radial deviation    Wrist pronation    Wrist supination    (Blank rows = not tested)  HAND FUNCTION: Grip strength: Right: 67.2 lbs; Left: 66.3 lbs and 3 point pinch: Right: 12 lbs, Left: 15 lbs  COORDINATION: 9 Hole Peg test: Right: 31 sec; Left: 59 sec Box and Blocks:  Right 27 blocks, Left 19blocks  SENSATION: WFL  EDEMA: None  MUSCLE TONE: WFL  COGNITION: Overall cognitive status:  Grossly WFL, possible for attention deficits.  VISION: Subjective report: Pt reports some jerky vision since December Baseline vision: Wears glasses all the time Visual history: cataracts and macular degeneration  VISION ASSESSMENT: Impaired Eye alignment: Impaired: Right eye seemed to have amblyopia-type appearance with visual pursuits  Reading acuity: Pt reports difficulty reading instructions at this time,  he reports all letters merge together Tracking/Visual pursuits: Right eye appears to be slower than left Saccades: overshoots - right eye from temporal to nasal Convergence: WFL Visual Fields: Grossly intact; however, decreased peripheral vision to right side when provided with moving target with detection of object at 30* Diplopia assessment: When given macular degeneration Amsler grid, pt reports double vision of right eye when left eye occluded. Quadrants - WFL   Patient has difficulty with following activities due to following visual impairments: Work related tasks  PERCEPTION: WFL  PRAXIS: WFL  OBSERVATIONS: Patient arrives to session ambulating with rolling walker. Patient wife primarily speaks during the session given patient's dysarthria. Per caregiver, patient had a surgery to address AVM and then had pseudomengiocele and underwent 3 surgeries as a  result of complications. Per report, patient was D/C from hospital and then returned with symptoms of acute stroke. Prior to December patient was fully independent, working full time in IT, and golfing regularly every weekend; however, patient is now fully supervised for all ADLs, not working, and now mobilizing either via manual W/C or walker. Patient reports increased nausea disrupting appetite. Family reports going to the ED on last Wednesday due to new reports of nausea and double vision and there was concern for another stroke; however, family decided to the return home when CT came back clear and did not want to go through additional MRI. Per family, inpatient rehab physician aware of this and recommended they call their neurosurgeon for follow up as well and did not advise against continuing outpatient therapy. Family also expresses concern in not knowing how to truly recognize a stroke as patient now has such extensive deficits. Family also reports that a therapist in inpatient rehab noticed unique eye movements and recommend they also see a therapist that could help address vestibular concerns. Wife showing OT video today. Video showed pt performing visual pursuits with jerky eye movement going from left to right sides, right eye impacted.  09/09/22: Pt received from PT lying on mat table. Pt stating taking a rest break. Pt denies pain or dizziness.    TODAY'S TREATMENT:                                                                                                                              DATE:  Incorporated game of golf to motivate pt given his specified goal of returning to golf. OT facilitating putting game with pt selecting putter of choice. OT selecting yellow ball to assist with impaired vision versus white ball on a white floor. Red cones also set up on either side of target for enhanced visual fixation and scanning. Pt performing 3 trials then expressing need to sit down. Pt reporting mild  headache and not feeling well. BP taken at this time. 124/89, HR 59. Pt requesting to perform rest of OT treatment visit seated. Pt engaging in bean bag toss for coordination and sitting balance. Pt utilizing LUE first trial,  tossing bean bag overhead to basket. Pt instructed to reach across midline with RUE, weight-bearing through LUE for second trial; however, pt reporting intermittent "flare-ups" of RUE. Wife discussing pt's choice of sometimes not taking arthritis medicine. Pt reports medication is typically what helps the most with this pain. OT deferring back to utilizing LUE for bean bag toss.  PATIENT EDUCATION: Education details: Use of desired occupations for treatment, modalities to address RUE pain  Person educated: Patient and Spouse Education method: Explanation, Demonstration, Tactile cues, and Verbal cues Education comprehension: verbalized understanding, returned demonstration, verbal cues required, tactile cues required, and needs further education  HOME EXERCISE PROGRAM: 09/02/22: Coordination HEP 09/04/22: Modified coordination HEP (for vision) and vision HEP 09/09/22: N/A  GOALS: Goals reviewed with patient? Yes  SHORT TERM GOALS: Target date: 09/30/2022   Pt will be independent in LUE HEP. Baseline: Initiated Goal status: IN PROGRESS  2.  Pt will demonstrate improved fine motor coordination for ADLs as evidenced by decreasing 9 hole peg test score for left hand by >/=5 secs  Baseline: Lt - 59 secs Goal status: INITIAL  3.  Pt will be able to place at least 3 blocks using left hand with completion of Box and Blocks test.  Baseline: Lt- 19 blocks Goal status: INITIAL  4.  Pt will tolerate >/=10 minutes of standing activity to simulate activity tolerance needed for ADL/IADL management. Baseline: Subjective description of sitting down for most tasks Goal status: INITIAL  5.  Pt will write a sentence with no significant decrease in size and maintain 100% legibility.   Baseline: 75% legibility and mild micrographia Goal status: INITIAL  6.  Pt will improve visual closure skills to recognize and complete visual patterns or missing parts of visual stimuli, enhancing visual perception and problem-solving abilities. Baseline: Overshooting eye movements, impaired peripheral vision, merging of letters per subjective report Goal status: INITIAL  LONG TERM GOALS: Target date: 11/11/2022   Pt will complete FOTO assessment at time of discharge scoring 68 or greater indicating functional progression with ADL and IADL completion. Baseline: 58  Goal status: INITIAL  2.  Pt will demonstrate improved fine motor coordination for ADLs as evidenced by decreasing 9 hole peg test score for left hand by >/=10 secs  Baseline: Lt - 59 secs Goal status: INITIAL  3.  Pt will be able to place at least 5 blocks using left hand with completion of Box and Blocks test.  Baseline: Lt - 19 blocks Goal status: INITIAL  4.  Pt will improve dynamic standing balance to Good (stand independently unsupported, able to weight shift and cross midline moderately) as needed for managing ADL/IADL tasks. Baseline: Subjective description of Fair (stand independently unsupported, weight shift, and reach ipsilaterally, LOB when crossing midline) with rolling walker Goal status: INITIAL  5.  Pt will verbalize at least two visual compensatory methods to include adaptive equipment as needed independently. Baseline:  Goal status: INITIAL   ASSESSMENT:  CLINICAL IMPRESSION: Pt appears to not be feeling well this visit; however, when prompted, pt consistently responds with feeling fine. He would then discuss having a headache and requiring frequent rest today. Despite this, pt motivated and willing to participate in OT treatment visit today. Pt continues to need encouragement for specific treatment activities with him stating "it doesn't matter." Concern pt has some sadness over his current symptoms,  and him stating he does not want to be a burden. Emotional support provided. Pt limited by performance deficits as listed below, and would  continue to benefit from skilled OT services to maximize independence in activities of daily living.  PERFORMANCE DEFICITS: in functional skills including ADLs, IADLs, coordination, dexterity, pain, Fine motor control, Gross motor control, mobility, balance, body mechanics, endurance, vision, and UE functional use, cognitive skills including attention, energy/drive, and memory, and psychosocial skills including coping strategies, environmental adaptation, habits, and routines and behaviors.   IMPAIRMENTS: are limiting patient from ADLs, IADLs, work, and leisure.   CO-MORBIDITIES: may have co-morbidities  that affects occupational performance. Patient will benefit from skilled OT to address above impairments and improve overall function.  REHAB POTENTIAL: Good  PLAN:  OT FREQUENCY: 2x/week  OT DURATION: 10 weeks (anticipate 8 weeks, but pt going out of town for the summer months)  PLANNED INTERVENTIONS: self care/ADL training, therapeutic exercise, therapeutic activity, neuromuscular re-education, balance training, functional mobility training, electrical stimulation, fluidotherapy, moist heat, cryotherapy, patient/family education, cognitive remediation/compensation, visual/perceptual remediation/compensation, psychosocial skills training, energy conservation, coping strategies training, DME and/or AE instructions, and Re-evaluation  RECOMMENDED OTHER SERVICES: Follow-up with neuro ophthalmologist. Pt reports having an "eye doctor" that he goes to annually.  CONSULTED AND AGREED WITH PLAN OF CARE: Patient and family member/caregiver  PLAN FOR NEXT SESSION:  toss ball back and forth, vision, scanning, desensitization possibly for scalp, balance   Markan Cazarez M Leonie Amacher, OT 09/09/2022, 11:01 AM

## 2022-09-10 ENCOUNTER — Encounter: Payer: Self-pay | Admitting: Occupational Therapy

## 2022-09-10 ENCOUNTER — Ambulatory Visit: Payer: BC Managed Care – PPO | Admitting: Occupational Therapy

## 2022-09-10 ENCOUNTER — Ambulatory Visit: Payer: BC Managed Care – PPO | Admitting: Speech Pathology

## 2022-09-10 DIAGNOSIS — R2681 Unsteadiness on feet: Secondary | ICD-10-CM

## 2022-09-10 DIAGNOSIS — R1310 Dysphagia, unspecified: Secondary | ICD-10-CM | POA: Diagnosis not present

## 2022-09-10 DIAGNOSIS — R471 Dysarthria and anarthria: Secondary | ICD-10-CM

## 2022-09-10 DIAGNOSIS — R41840 Attention and concentration deficit: Secondary | ICD-10-CM

## 2022-09-10 DIAGNOSIS — R262 Difficulty in walking, not elsewhere classified: Secondary | ICD-10-CM | POA: Diagnosis not present

## 2022-09-10 DIAGNOSIS — R41842 Visuospatial deficit: Secondary | ICD-10-CM

## 2022-09-10 DIAGNOSIS — M6281 Muscle weakness (generalized): Secondary | ICD-10-CM | POA: Diagnosis not present

## 2022-09-10 DIAGNOSIS — R278 Other lack of coordination: Secondary | ICD-10-CM | POA: Diagnosis not present

## 2022-09-10 DIAGNOSIS — R42 Dizziness and giddiness: Secondary | ICD-10-CM | POA: Diagnosis not present

## 2022-09-10 DIAGNOSIS — R269 Unspecified abnormalities of gait and mobility: Secondary | ICD-10-CM | POA: Diagnosis not present

## 2022-09-10 NOTE — Therapy (Signed)
OUTPATIENT OCCUPATIONAL THERAPY NEURO TREATMENT  Patient Name: Christopher Yu MRN: [id number] DOB:23-Oct-1960, 62 y.o., male Today's Date: 09/10/2022  PCP: Thana Ates, MD REFERRING PROVIDER: Charlton Amor, PA-C  END OF SESSION:  OT End of Session - 09/10/22 1104     Visit Number 4    Number of Visits 20    Date for OT Re-Evaluation 06/20/22    Authorization Type [employer]    OT Start Time 1103    OT Stop Time 1145    OT Time Calculation (min) 42 min    Activity Tolerance Patient tolerated treatment well    Behavior During Therapy WFL for tasks assessed/performed;Flat affect             Past Medical History:  Diagnosis Date   Anxiety    Arthritis, rheumatoid (HCC)    dx 1988   CAD (coronary artery disease), native coronary artery    Mild LAD disease with calcification noted at cath 12//27/16    Cardiomyopathy (HCC)    Cardiomyopathy (HCC)    Chronic systolic heart failure (HCC) 05/01/2015   Depression    Deviated nasal septum 06/25/2011   Headache    High cholesterol    Hyperlipidemia    Impingement syndrome of left shoulder 12/22/2017   Macular degeneration    Nausea and vomiting 08/28/2022   Rheumatoid aortitis    Sleep apnea    does not use cpap - UNABLE TO TOLERATE MASK   Sleep apnea in adult    deviated septum repaired, most recent sleep study was negative   Status post total right knee replacement 06/01/2015   Past Surgical History:  Procedure Laterality Date   ANKLE FUSION Right 05/05/2012   related to his arthritis   ANKLE FUSION Left 05/25/2013   related to his arthritis   ANKLE SURGERY Bilateral    APPLICATION OF CRANIAL NAVIGATION N/A 04/11/2022   Procedure: APPLICATION OF CRANIAL NAVIGATION;  Surgeon: Lisbeth Renshaw, MD;  Location: [facility] OR;  Service: Neurosurgery;  Laterality: N/A;   APPLICATION OF CRANIAL NAVIGATION N/A 08/08/2022   Procedure: APPLICATION OF CRANIAL NAVIGATION;  Surgeon: Lisbeth Renshaw, MD;  Location: [facility] OR;   Service: Neurosurgery;  Laterality: N/A;   CARDIAC CATHETERIZATION N/A 05/01/2015   Procedure: Left Heart Cath and Coronary Angiography;  Surgeon: Lyn Records, MD;  Location: [facility] INVASIVE CV LAB;  Service: Cardiovascular;  Laterality: N/A;   CRANIOTOMY N/A 04/11/2022   Procedure: STEREOTACTIC SUBOCCIPITAL CRANIOTOMY FOR RESECTION OF ARTERIO-VENOUS MALFORMATION;  Surgeon: Lisbeth Renshaw, MD;  Location: [facility] OR;  Service: Neurosurgery;  Laterality: N/A;   FRACTURE SURGERY     left femur fracture x 3   IR ANGIO EXTERNAL CAROTID SEL EXT CAROTID BILAT MOD SED  01/28/2022   IR ANGIO INTRA EXTRACRAN SEL INTERNAL CAROTID BILAT MOD SED  01/28/2022   IR ANGIO INTRA EXTRACRAN SEL INTERNAL CAROTID BILAT MOD SED  04/16/2022   IR ANGIO VERTEBRAL SEL VERTEBRAL BILAT MOD SED  01/28/2022   IR ANGIO VERTEBRAL SEL VERTEBRAL BILAT MOD SED  04/16/2022   KNEE ARTHROSCOPY     right x 4   LAPAROSCOPIC REVISION VENTRICULAR-PERITONEAL (V-P) SHUNT N/A 08/08/2022   Procedure: LAPAROSCOPIC VENTRICULAR-PERITONEAL (V-P) SHUNT;  Surgeon: Harriette Bouillon, MD;  Location: [facility] OR;  Service: General;  Laterality: N/A;   LUMBAR LAMINECTOMY/DECOMPRESSION MICRODISCECTOMY N/A 07/18/2022   Procedure: Repair of Pseudomeningiocele posterior;  Surgeon: Lisbeth Renshaw, MD;  Location: [facility] OR;  Service: Neurosurgery;  Laterality: N/A;   NASAL SEPTOPLASTY W/ TURBINOPLASTY  06/25/2011  Procedure: NASAL SEPTOPLASTY WITH TURBINATE REDUCTION;  Surgeon: Osborn Coho, MD;  Location: [facility] OR;  Service: ENT;  Laterality: Bilateral;   PLACEMENT OF LUMBAR DRAIN N/A 07/18/2022   Procedure: PLACEMENT OF LUMBAR DRAIN;  Surgeon: Lisbeth Renshaw, MD;  Location: [facility] OR;  Service: Neurosurgery;  Laterality: N/A;   TONSILLECTOMY     TOTAL KNEE ARTHROPLASTY Right 06/01/2015   Procedure: RIGHT TOTAL KNEE ARTHROPLASTY;  Surgeon: Kathryne Hitch, MD;  Location: [facility] ORS;  Service: Orthopedics;  Laterality: Right;  Block+general   VENTRICULOPERITONEAL  SHUNT Right 08/08/2022   Procedure: LAP ASSITED SHUNT INSERTION VENTRICULAR-PERITONEAL;  Surgeon: Lisbeth Renshaw, MD;  Location: [facility] OR;  Service: Neurosurgery;  Laterality: Right;   Patient Active Problem List   Diagnosis Date Noted   Nausea and vomiting 08/28/2022   Adjustment disorder 08/06/2022   Protein-calorie malnutrition, severe 07/31/2022   Cerebellar stroke (HCC) 07/29/2022   Ischemic cerebrovascular accident (CVA) (HCC) 07/28/2022   Prolonged QT interval 07/28/2022   Pseudomeningocele 07/18/2022   Pseudomeningocele due to surgical procedure 07/18/2022   Status post craniotomy 04/11/2022   AVM (arteriovenous malformation) brain 04/11/2022   Impingement syndrome of left shoulder 12/22/2017   Impingement syndrome of right shoulder 12/22/2017   Rheumatoid arthritis involving right knee (HCC) 06/01/2015   Status post total right knee replacement 06/01/2015   Cardiomyopathy (HCC) 05/02/2015   Hyperlipidemia    Sleep apnea in adult    Rheumatoid arthritis (HCC)    Chronic systolic heart failure (HCC) 05/01/2015   CAD (coronary artery disease), native coronary artery     ONSET DATE: 08/21/2022 (date of referral)  REFERRING DIAG: I63.9 (ICD-10-CM) - Cerebral infarction, unspecified  THERAPY DIAG:  Other lack of coordination  Unsteadiness on feet  Muscle weakness (generalized)  Visuospatial deficit  Attention and concentration deficit  Rationale for Evaluation and Treatment: Rehabilitation  SUBJECTIVE:   SUBJECTIVE STATEMENT: Pt's wife reports pt is having dry eyes, and that is the only thing really bothering him. Pt also reports pain in Rt shoulder from RA flareup   Pt accompanied by: self and significant other, [name]  PERTINENT HISTORY: CT/MRI showed new small focus of acute infarction in the right superior cerebellar hemisphere. Cerebellar AVM resection 12/23, pseudomeningocele s/p craniotomy 3/24, acute R cerebellar infarct, hydrocephalus and VP shunt was placed  on 08/08/22, dysphagia, dysarthria, aphasia. PMH: rheumatoid arthritis, bilateral ankle fusion (2014 and 2015), cardiomyopathy, CAD, R TKA, cardiomyopathy, chronic systolic heart failure, macular degeneration, OSA on CPAP  PRECAUTIONS: Fall and Other: swallow precautions with modification with left head tilt and supervision if drinking thinned liquids, per caregiver not to lift, history of orthostatic hypotension resulting in falls; no driving  WEIGHT BEARING RESTRICTIONS: No  PAIN:  Are you having pain? Rt shoulder 8/10 from RA flare up, constant (premorbid)   FALLS: Has patient fallen in last 6 months? Yes. Number of falls 3 - after the first surgery, got dizzy and fell, using the walker, when up and moving, struggled with orthostatic hypotension but medical team has since decreased BP medications  LIVING ENVIRONMENT: Lives with: lives with their spouse, two teenage daughters (also has dtr who lives in [place]), and 175 lb dog Lives in: House/apartment - Able to live on main floor Stairs: Yes: Internal: ~14 steps; on right going up and External: 3 steps; on right going up Has following equipment at home: Dan Humphreys - 2 wheeled and Wheelchair (manual) Bathroom setup: [employer], regular height toilet  PLOF: Prior to December, pt completely independent with ADL/IADLs, ambulatory without device,  pt enjoys golfing, driving, 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  PATIENT GOALS: Return to golf, improve vision for work, travel in June for daughter's outdoor wedding (be off the walker ideally)  OBJECTIVE: (results from evaluation unless otherwise stated)  HAND DOMINANCE: Right  ADLs: Eating: Independent with self-feeding, wife cuts items due to impaired coordination per her report Grooming: Independent mostly, but reports it's painful to brush hair from surgical procedure in December UB Dressing: Supervision, increased time and wife present if needed LB Dressing: Supervision increased time and wife  present if needed. Sometimes sits to don pants Toileting: Supervision, more effort, wife there in case pt loses balance Bathing: Supervision for balance Tub Shower transfers: Supervision likely; however, sponge bath currently due to hand held shower head breaking and plumber coming 5/1.  Equipment:  Toilet riser with armrests , shower chair, bedrail on bed  IADLs: Shopping: Currently unable, previously independent Light housekeeping: Currently unable, previously did laundry Meal Prep: Currently unable, but previously assisted in task Community mobility: and household, pt utilizing rolling walker at all times. Typically sedentary in recliner most of the day. Gets up for bathroom.  Medication management: Wife Presenter, broadcasting: Currently unable, pt typically did before Handwriting: 75% legible and Mild micrographia   MOBILITY STATUS:  Pt ambulatory with RW, wife providing supervision  at all times  POSTURE COMMENTS:  rounded shoulders Sitting balance: Moves/returns truncal midpoint 1-2 inches in multiple planes - Wife describes she believes it is more of a coordination issue, overshooting and then losing balance.   ACTIVITY TOLERANCE: Activity tolerance: Subjective report of significant fatigue  FUNCTIONAL OUTCOME MEASURES: FOTO: 58 score  UPPER EXTREMITY ROM:    Active ROM Right eval Left eval  Shoulder flexion [facility]  Shoulder abduction [facility]  Shoulder adduction [facility] WFL  Shoulder extension [facility]  Shoulder internal rotation    Shoulder external rotation    Elbow flexion WFL WFL  Elbow extension [facility] WFL  Wrist flexion    Wrist extension    Wrist ulnar deviation    Wrist radial deviation    Wrist pronation    Wrist supination    (Blank rows = not tested)  UPPER EXTREMITY MMT:     MMT Right eval Left eval  Shoulder flexion 4+/5 4+/5  Shoulder abduction    Shoulder adduction    Shoulder extension    Shoulder internal rotation    Shoulder external  rotation    Middle trapezius    Lower trapezius    Elbow flexion 4+/5 4+/5  Elbow extension 4+/5 4+/5  Wrist flexion    Wrist extension    Wrist ulnar deviation    Wrist radial deviation    Wrist pronation    Wrist supination    (Blank rows = not tested)  HAND FUNCTION: Grip strength: Right: 67.2 lbs; Left: 66.3 lbs and 3 point pinch: Right: 12 lbs, Left: 15 lbs  COORDINATION: 9 Hole Peg test: Right: 31 sec; Left: 59 sec Box and Blocks:  Right 27 blocks, Left 19blocks  SENSATION: WFL  EDEMA: None  MUSCLE TONE: WFL  COGNITION: Overall cognitive status:  Grossly WFL, possible for attention deficits.  VISION: Subjective report: Pt reports some jerky vision since December Baseline vision: Wears glasses all the time Visual history: cataracts and macular degeneration  VISION ASSESSMENT: Impaired Eye alignment: Impaired: Right eye seemed to have amblyopia-type appearance with visual pursuits  Reading acuity: Pt reports difficulty reading instructions at this time, he reports all letters merge together  Tracking/Visual pursuits: Right eye appears to be slower than left Saccades: overshoots - right eye from temporal to nasal Convergence: WFL Visual Fields: Grossly intact; however, decreased peripheral vision to right side when provided with moving target with detection of object at 30* Diplopia assessment: When given macular degeneration Amsler grid, pt reports double vision of right eye when left eye occluded. Quadrants - WFL   Patient has difficulty with following activities due to following visual impairments: Work related tasks  PERCEPTION: WFL  PRAXIS: WFL  OBSERVATIONS: Patient arrives to session ambulating with rolling walker. Patient wife primarily speaks during the session given patient's dysarthria. Per caregiver, patient had a surgery to address AVM and then had pseudomengiocele and underwent 3 surgeries as a result of complications. Per report, patient was D/C  from hospital and then returned with symptoms of acute stroke. Prior to December patient was fully independent, working full time in IT, and golfing regularly every weekend; however, patient is now fully supervised for all ADLs, not working, and now mobilizing either via manual W/C or walker. Patient reports increased nausea disrupting appetite. Family reports going to the ED on last Wednesday due to new reports of nausea and double vision and there was concern for another stroke; however, family decided to the return home when CT came back clear and did not want to go through additional MRI. Per family, inpatient rehab physician aware of this and recommended they call their neurosurgeon for follow up as well and did not advise against continuing outpatient therapy. Family also expresses concern in not knowing how to truly recognize a stroke as patient now has such extensive deficits. Family also reports that a therapist in inpatient rehab noticed unique eye movements and recommend they also see a therapist that could help address vestibular concerns. Wife showing OT video today. Video showed pt performing visual pursuits with jerky eye movement going from left to right sides, right eye impacted.  09/09/22: Pt received from PT lying on mat table. Pt stating taking a rest break. Pt denies pain or dizziness.    TODAY'S TREATMENT:                                                                                                                              DATE: 09/10/22  Pt refused to toss ball d/t Rt shoulder pain from RA flare up.   Pt copying PVC peg design (Fig 2) w/ mod verbal cues d/t visual deficits (pt could id shape correctly, but could not read shape's corresponding letter) using predominantly LUE. Progressed to more challenging design (Fig 14) w/ same amount of cueing  Pt shown gentle shoulder stretches for RUE: table slides in sh flex/ext to tolerance and circumduction (clockwise only)  Standing to  place large pegs in pegboard on vertical surface LUE for coordination, visual scanning and dynamic standing balance. Pt required 3 rest breaks (seated) to complete 5 rows w/ a different color for each row.  PATIENT EDUCATION: Education details: see above   HOME EXERCISE PROGRAM: 09/02/22: Coordination HEP 09/04/22: Modified coordination HEP (for vision) and vision HEP 09/09/22: N/A  GOALS: Goals reviewed with patient? Yes  SHORT TERM GOALS: Target date: 09/30/2022   Pt will be independent in LUE HEP. Baseline: Initiated Goal status: IN PROGRESS  2.  Pt will demonstrate improved fine motor coordination for ADLs as evidenced by decreasing 9 hole peg test score for left hand by >/=5 secs  Baseline: Lt - 59 secs Goal status: INITIAL  3.  Pt will be able to place at least 3 blocks using left hand with completion of Box and Blocks test.  Baseline: Lt- 19 blocks Goal status: INITIAL  4.  Pt will tolerate >/=10 minutes of standing activity to simulate activity tolerance needed for ADL/IADL management. Baseline: Subjective description of sitting down for most tasks Goal status: INITIAL  5.  Pt will write a sentence with no significant decrease in size and maintain 100% legibility.  Baseline: 75% legibility and mild micrographia Goal status: INITIAL  6.  Pt will improve visual closure skills to recognize and complete visual patterns or missing parts of visual stimuli, enhancing visual perception and problem-solving abilities. Baseline: Overshooting eye movements, impaired peripheral vision, merging of letters per subjective report Goal status: INITIAL  LONG TERM GOALS: Target date: 11/11/2022   Pt will complete FOTO assessment at time of discharge scoring 68 or greater indicating functional progression with ADL and IADL completion. Baseline: 58  Goal status: INITIAL  2.  Pt will demonstrate improved fine motor coordination for ADLs as evidenced by decreasing 9 hole peg test score for  left hand by >/=10 secs  Baseline: Lt - 59 secs Goal status: INITIAL  3.  Pt will be able to place at least 5 blocks using left hand with completion of Box and Blocks test.  Baseline: Lt - 19 blocks Goal status: INITIAL  4.  Pt will improve dynamic standing balance to Good (stand independently unsupported, able to weight shift and cross midline moderately) as needed for managing ADL/IADL tasks. Baseline: Subjective description of Fair (stand independently unsupported, weight shift, and reach ipsilaterally, LOB when crossing midline) with rolling walker Goal status: INITIAL  5.  Pt will verbalize at least two visual compensatory methods to include adaptive equipment as needed independently. Baseline:  Goal status: INITIAL   ASSESSMENT:  CLINICAL IMPRESSION: Pt began session refusing to do anything d/t shoulder pain, however increased participation after modifications to session. Pt requires frequent rest breaks with standing activities  PERFORMANCE DEFICITS: in functional skills including ADLs, IADLs, coordination, dexterity, pain, Fine motor control, Gross motor control, mobility, balance, body mechanics, endurance, vision, and UE functional use, cognitive skills including attention, energy/drive, and memory, and psychosocial skills including coping strategies, environmental adaptation, habits, and routines and behaviors.   IMPAIRMENTS: are limiting patient from ADLs, IADLs, work, and leisure.   CO-MORBIDITIES: may have co-morbidities  that affects occupational performance. Patient will benefit from skilled OT to address above impairments and improve overall function.  REHAB POTENTIAL: Good  PLAN:  OT FREQUENCY: 2x/week  OT DURATION: 10 weeks (anticipate 8 weeks, but pt going out of town for the summer months)  PLANNED INTERVENTIONS: self care/ADL training, therapeutic exercise, therapeutic activity, neuromuscular re-education, balance training, functional mobility training,  electrical stimulation, fluidotherapy, moist heat, cryotherapy, patient/family education, cognitive remediation/compensation, visual/perceptual remediation/compensation, psychosocial skills training, energy conservation, coping strategies training, DME and/or AE instructions, and Re-evaluation  RECOMMENDED OTHER SERVICES: Follow-up with neuro ophthalmologist. Pt  reports having an "eye doctor" that he goes to annually.  CONSULTED AND AGREED WITH PLAN OF CARE: Patient and family member/caregiver  PLAN FOR NEXT SESSION:  toss ball back and forth if shoulder pain allows, continue visual scanning, desensitization possibly for scalp, balance   Sheran Lawless, OT 09/10/2022, 11:04 AM

## 2022-09-10 NOTE — Therapy (Deleted)
op

## 2022-09-10 NOTE — Patient Instructions (Addendum)
      SLOW LOUD OVER-ENNUNCIATE PAUSE  BUTTERCUP  CATERPILLAR  BASEBALLL PLAYER  [place]  [place]  SLOW AND BIG - EXAGGERATE YOUR MOUTH, MAKE EACH CONSONANT  Speech exercises - do 5x each, x2-3/day SLOW BIG   SAY THE FOLLOWING- make every sound! Red leather, yellow leather     Purple baby carriage    [place] [employer] Proper copper coffee pot Ripe purple cabbage Three free throws [place] [employer], Blue Bulb Flash Message Six Thick Thistles Stick Double Bubble Gum Cinnamon aluminum linoleum Black bugs blood Lovely lemon linament Tying Tape Takes Time A Shifty Salt Shaker   Four floors to cover Unique [place] A Three Toed Tree Toad Knapsack Strap Snap Rubber Baby Buggy Bumpers [place]  Seven Salty Sailors Sailed the [place] Which Wrist Watches are [place] Wrist Watches

## 2022-09-10 NOTE — Therapy (Signed)
OUTPATIENT SPEECH LANGUAGE PATHOLOGY TREATMENT NOTE   Patient Name: Christopher Yu MRN: [id number] DOB:1960/12/21, 62 y.o., male Today's Date: 09/10/2022  PCP: Thana Ates, MD REFERRING PROVIDER: Charlton Amor, PA-C   END OF SESSION:   End of Session - 09/10/22 1231     Visit Number 2    Number of Visits 17    Date for SLP Re-Evaluation 10/24/22    Authorization Type [employer]    SLP Start Time 1230    SLP Stop Time  1315    SLP Time Calculation (min) 45 min    Activity Tolerance Patient tolerated treatment well             Past Medical History:  Diagnosis Date   Anxiety    Arthritis, rheumatoid (HCC)    dx 1988   CAD (coronary artery disease), native coronary artery    Mild LAD disease with calcification noted at cath 12//27/16    Cardiomyopathy (HCC)    Cardiomyopathy (HCC)    Chronic systolic heart failure (HCC) 05/01/2015   Depression    Deviated nasal septum 06/25/2011   Headache    High cholesterol    Hyperlipidemia    Impingement syndrome of left shoulder 12/22/2017   Macular degeneration    Nausea and vomiting 08/28/2022   Rheumatoid aortitis    Sleep apnea    does not use cpap - UNABLE TO TOLERATE MASK   Sleep apnea in adult    deviated septum repaired, most recent sleep study was negative   Status post total right knee replacement 06/01/2015   Past Surgical History:  Procedure Laterality Date   ANKLE FUSION Right 05/05/2012   related to his arthritis   ANKLE FUSION Left 05/25/2013   related to his arthritis   ANKLE SURGERY Bilateral    APPLICATION OF CRANIAL NAVIGATION N/A 04/11/2022   Procedure: APPLICATION OF CRANIAL NAVIGATION;  Surgeon: Lisbeth Renshaw, MD;  Location: [facility] OR;  Service: Neurosurgery;  Laterality: N/A;   APPLICATION OF CRANIAL NAVIGATION N/A 08/08/2022   Procedure: APPLICATION OF CRANIAL NAVIGATION;  Surgeon: Lisbeth Renshaw, MD;  Location: [facility] OR;  Service: Neurosurgery;  Laterality: N/A;   CARDIAC  CATHETERIZATION N/A 05/01/2015   Procedure: Left Heart Cath and Coronary Angiography;  Surgeon: Lyn Records, MD;  Location: [facility] INVASIVE CV LAB;  Service: Cardiovascular;  Laterality: N/A;   CRANIOTOMY N/A 04/11/2022   Procedure: STEREOTACTIC SUBOCCIPITAL CRANIOTOMY FOR RESECTION OF ARTERIO-VENOUS MALFORMATION;  Surgeon: Lisbeth Renshaw, MD;  Location: [facility] OR;  Service: Neurosurgery;  Laterality: N/A;   FRACTURE SURGERY     left femur fracture x 3   IR ANGIO EXTERNAL CAROTID SEL EXT CAROTID BILAT MOD SED  01/28/2022   IR ANGIO INTRA EXTRACRAN SEL INTERNAL CAROTID BILAT MOD SED  01/28/2022   IR ANGIO INTRA EXTRACRAN SEL INTERNAL CAROTID BILAT MOD SED  04/16/2022   IR ANGIO VERTEBRAL SEL VERTEBRAL BILAT MOD SED  01/28/2022   IR ANGIO VERTEBRAL SEL VERTEBRAL BILAT MOD SED  04/16/2022   KNEE ARTHROSCOPY     right x 4   LAPAROSCOPIC REVISION VENTRICULAR-PERITONEAL (V-P) SHUNT N/A 08/08/2022   Procedure: LAPAROSCOPIC VENTRICULAR-PERITONEAL (V-P) SHUNT;  Surgeon: Harriette Bouillon, MD;  Location: [facility] OR;  Service: General;  Laterality: N/A;   LUMBAR LAMINECTOMY/DECOMPRESSION MICRODISCECTOMY N/A 07/18/2022   Procedure: Repair of Pseudomeningiocele posterior;  Surgeon: Lisbeth Renshaw, MD;  Location: [facility] OR;  Service: Neurosurgery;  Laterality: N/A;   NASAL SEPTOPLASTY W/ TURBINOPLASTY  06/25/2011   Procedure: NASAL SEPTOPLASTY WITH TURBINATE REDUCTION;  Surgeon: Osborn Coho, MD;  Location: [facility] OR;  Service: ENT;  Laterality: Bilateral;   PLACEMENT OF LUMBAR DRAIN N/A 07/18/2022   Procedure: PLACEMENT OF LUMBAR DRAIN;  Surgeon: Lisbeth Renshaw, MD;  Location[facility];  Service: Neurosurgery;  Laterality: N/A;   TONSILLECTOMY     TOTAL KNEE ARTHROPLASTY Right 06/01/2015   Procedure: RIGHT TOTAL KNEE ARTHROPLASTY;  Surgeon: Kathryne Hitch, MD;  Location: [facility] ORS;  Service: Orthopedics;  Laterality: Right;  Block+general   VENTRICULOPERITONEAL SHUNT Right 08/08/2022   Procedure: LAP ASSITED SHUNT  INSERTION VENTRICULAR-PERITONEAL;  Surgeon: Lisbeth Renshaw, MD;  Location: [facility] OR;  Service: Neurosurgery;  Laterality: Right;   Patient Active Problem List   Diagnosis Date Noted   Nausea and vomiting 08/28/2022   Adjustment disorder 08/06/2022   Protein-calorie malnutrition, severe 07/31/2022   Cerebellar stroke (HCC) 07/29/2022   Ischemic cerebrovascular accident (CVA) (HCC) 07/28/2022   Prolonged QT interval 07/28/2022   Pseudomeningocele 07/18/2022   Pseudomeningocele due to surgical procedure 07/18/2022   Status post craniotomy 04/11/2022   AVM (arteriovenous malformation) brain 04/11/2022   Impingement syndrome of left shoulder 12/22/2017   Impingement syndrome of right shoulder 12/22/2017   Rheumatoid arthritis involving right knee (HCC) 06/01/2015   Status post total right knee replacement 06/01/2015   Cardiomyopathy (HCC) 05/02/2015   Hyperlipidemia    Sleep apnea in adult    Rheumatoid arthritis (HCC)    Chronic systolic heart failure (HCC) 05/01/2015   CAD (coronary artery disease), native coronary artery     ONSET DATE: 07/28/22  REFERRING DIAG:  I63.9 (ICD-10-CM) - Cerebral infarction, unspecified   THERAPY DIAG:  Dysarthria and anarthria  Dysphagia, unspecified type  Rationale for Evaluation and Treatment: Rehabilitation  SUBJECTIVE:   SUBJECTIVE STATEMENT: "He is not needed the nectar thick much anymore"   PAIN:  Are you having pain? Yes NPRS scale: 8/10 Pain location: right shoulder Pain orientation: Right  PAIN TYPE: aching Pain description: constant  Aggravating factors: movement Relieving factors: rest   OBJECTIVE:   TODAY'S TREATMENT:                                                                                                                                         DATE:    09/10/22: Initiated HEP for dysarthria - see patient instructions - Initiated training in compensations for dysarthria (slow, loud. Over articulation and pause)  With cues to open mouth wide, make each sound distinct, Christopher completed sentences intelligibly 18/20.  Using compensations in structured task, generating 3 sentence description of basic object, Yu carried over strategies to be intelligible 10/12 sentences with consistent modeling and occasional min verbal cues. Repetition required 2/12 sentences with mod I at correcting errors to intelligible speech. In simple conversation he maintained intelligible speech with rare min A over 4 minutes. Danilo required verbal cue to look right with 2 swallows when he took tylenol  08/29/22: updated  HEP. Direct instruction for swallow exercises with pt able to confirm understanding via demonstration with min-A.      PATIENT EDUCATION: Education details: POC, goals, evaluation results/recommendations Person educated: Patient and Spouse Education method: Explanation, Demonstration, Verbal cues, and Handouts Education comprehension: verbalized understanding, returned demonstration, and needs further education     GOALS: Goals reviewed with patient? Yes   SHORT TERM GOALS: Target date: 10/03/2022   Pt will teach back dysarthria strategies and compensations with mod-I Baseline: Goal status: INITIAL   2.   Pt will employ dysarthria strategies during structured practice activities resulting in intelligible production at mult-sentence level in 80% of trials Baseline:  Goal status: INITIAL   3. Pt will demonstrate use of swallow compensations/strategies with PO trials with mod-I over 2 sessions  Baseline:  Goal status: INITIAL   4.  Pt will complete HEP 6/7 days over 1 week period with mod-I Baseline:  Goal status: INITIAL     LONG TERM GOALS: Target date: 10/24/2022   Pt will complete repeat MBSS Baseline:  Goal status: INITIAL   2.  Pt will carryover dysarthria strategies/compensations during 20 minute conversation resulting in 90% ineligibility  Baseline:  Goal status: INITIAL   3.  Pt and  spouse will report subjective report of reduced need for requests for repetition by 50% at home, over 1 week period Baseline:  Goal status: INITIAL   4.  Pt will report improvement via PROM by dc  Baseline:  Goal status: INITIAL     ASSESSMENT:   CLINICAL IMPRESSION: Patient is a 62 y.o. M who was seen today for dysphagia and motor speech eval s/p cerebellar stroke. Evaluation reveals moderate dysarthria and recent MBSS demonstrates mild pharyngeal dysphagia. Pt's speech is c/b irregular imprecise articulation and distortions, volume decay on expanded utterances, and reduced use of prosody. Pt reports frustration regarding his communicative abilities, tells SLP his vocation has high level of demands for communication and he feels he will "never get back there, it's impossible." As evaluation progressed, pt with increased frustration with SLP providing education to encourage participation and positive mindset for engagement in therapy course. Brief introduction to dysarthria strategies initiated. Reviewed instrumental swallow study results and updated pt's dysphagia HEP to accommodate preferences while targeting demonstrated deficits. Will plan to request repeat MBSS following usual completion of dysphagia HEP over 1-2 month period. Pt would benefit from skilled ST to address aforementioned deficits to improve QoL, enhance communication efficacy, and facilitate improved swallow function/safety .      OBJECTIVE IMPAIRMENTS: include dysarthria and dysphagia. These impairments are limiting patient from return to work, effectively communicating at home and in community, and safety when swallowing. Factors affecting potential to achieve goals and functional outcome are cooperation/participation level. Patient will benefit from skilled SLP services to address above impairments and improve overall function.   REHAB POTENTIAL: Good   PLAN:   SLP FREQUENCY: 2x/week   SLP DURATION: 8 weeks   PLANNED  INTERVENTIONS: Aspiration precaution training, Pharyngeal strengthening exercises, Diet toleration management , Trials of upgraded texture/liquids, Cueing hierachy, Internal/external aids, Functional tasks, Multimodal communication approach, SLP instruction and feedback, Compensatory strategies, Patient/family education, and Re-evaluation     Salman Wellen, Radene Journey, CCC-SLP 09/10/2022, 1:15 PM

## 2022-09-11 DIAGNOSIS — G96198 Other disorders of meninges, not elsewhere classified: Secondary | ICD-10-CM | POA: Diagnosis not present

## 2022-09-11 DIAGNOSIS — Z79899 Other long term (current) drug therapy: Secondary | ICD-10-CM | POA: Diagnosis not present

## 2022-09-11 DIAGNOSIS — R9431 Abnormal electrocardiogram [ECG] [EKG]: Secondary | ICD-10-CM | POA: Diagnosis not present

## 2022-09-11 DIAGNOSIS — I5022 Chronic systolic (congestive) heart failure: Secondary | ICD-10-CM | POA: Diagnosis not present

## 2022-09-11 DIAGNOSIS — R110 Nausea: Secondary | ICD-10-CM | POA: Diagnosis not present

## 2022-09-12 ENCOUNTER — Encounter: Payer: Self-pay | Admitting: Physical Therapy

## 2022-09-12 ENCOUNTER — Ambulatory Visit: Payer: BC Managed Care – PPO | Admitting: Physical Therapy

## 2022-09-12 VITALS — BP 112/85 | HR 86

## 2022-09-12 DIAGNOSIS — R41840 Attention and concentration deficit: Secondary | ICD-10-CM | POA: Diagnosis not present

## 2022-09-12 DIAGNOSIS — R42 Dizziness and giddiness: Secondary | ICD-10-CM | POA: Diagnosis not present

## 2022-09-12 DIAGNOSIS — M6281 Muscle weakness (generalized): Secondary | ICD-10-CM | POA: Diagnosis not present

## 2022-09-12 DIAGNOSIS — R278 Other lack of coordination: Secondary | ICD-10-CM | POA: Diagnosis not present

## 2022-09-12 DIAGNOSIS — R2681 Unsteadiness on feet: Secondary | ICD-10-CM | POA: Diagnosis not present

## 2022-09-12 DIAGNOSIS — R1310 Dysphagia, unspecified: Secondary | ICD-10-CM | POA: Diagnosis not present

## 2022-09-12 DIAGNOSIS — R262 Difficulty in walking, not elsewhere classified: Secondary | ICD-10-CM | POA: Diagnosis not present

## 2022-09-12 DIAGNOSIS — R471 Dysarthria and anarthria: Secondary | ICD-10-CM | POA: Diagnosis not present

## 2022-09-12 DIAGNOSIS — R269 Unspecified abnormalities of gait and mobility: Secondary | ICD-10-CM | POA: Diagnosis not present

## 2022-09-12 DIAGNOSIS — R41842 Visuospatial deficit: Secondary | ICD-10-CM | POA: Diagnosis not present

## 2022-09-12 LAB — DRUG TOX MONITOR 1 W/CONF, ORAL FLD
Alprazolam: NEGATIVE ng/mL (ref <0.50)
Amphetamines: NEGATIVE ng/mL (ref <10)
Barbiturates: NEGATIVE ng/mL (ref <10)
Benzodiazepines: POSITIVE ng/mL — AB (ref <0.50)
Buprenorphine: NEGATIVE ng/mL (ref <0.10)
Chlordiazepoxide: NEGATIVE ng/mL (ref <0.50)
Clonazepam: NEGATIVE ng/mL (ref <0.50)
Cocaine: NEGATIVE ng/mL (ref <5.0)
Codeine: NEGATIVE ng/mL (ref <2.5)
Diazepam: NEGATIVE ng/mL (ref <0.50)
Dihydrocodeine: NEGATIVE ng/mL (ref <2.5)
Fentanyl: NEGATIVE ng/mL (ref <0.10)
Flunitrazepam: NEGATIVE ng/mL (ref <0.50)
Flurazepam: NEGATIVE ng/mL (ref <0.50)
Heroin Metabolite: NEGATIVE ng/mL (ref <1.0)
Hydrocodone: NEGATIVE ng/mL (ref <2.5)
Hydromorphone: NEGATIVE ng/mL (ref <2.5)
Lorazepam: NEGATIVE ng/mL (ref <0.50)
MARIJUANA: NEGATIVE ng/mL (ref <2.5)
MDMA: NEGATIVE ng/mL (ref <10)
Meprobamate: NEGATIVE ng/mL (ref <2.5)
Methadone: NEGATIVE ng/mL (ref <5.0)
Midazolam: NEGATIVE ng/mL (ref <0.50)
Morphine: NEGATIVE ng/mL (ref <2.5)
Nicotine Metabolite: NEGATIVE ng/mL (ref <5.0)
Nordiazepam: 0.8 ng/mL — ABNORMAL HIGH (ref <0.50)
Norhydrocodone: NEGATIVE ng/mL (ref <2.5)
Noroxycodone: 6.4 ng/mL — ABNORMAL HIGH (ref <2.5)
Opiates: POSITIVE ng/mL — AB (ref <2.5)
Oxazepam: NEGATIVE ng/mL (ref <0.50)
Oxycodone: 34.7 ng/mL — ABNORMAL HIGH (ref <2.5)
Oxymorphone: NEGATIVE ng/mL (ref <2.5)
Phencyclidine: NEGATIVE ng/mL (ref <10)
Tapentadol: NEGATIVE ng/mL (ref <5.0)
Temazepam: NEGATIVE ng/mL (ref <0.50)
Tramadol: NEGATIVE ng/mL (ref <5.0)
Triazolam: NEGATIVE ng/mL (ref <0.50)
Zolpidem: NEGATIVE ng/mL (ref <5.0)

## 2022-09-12 LAB — DRUG TOX ALC METAB W/CON, ORAL FLD: Alcohol Metabolite: NEGATIVE ng/mL (ref <25)

## 2022-09-12 NOTE — Patient Instructions (Signed)
Access Code: [email or web address] URL: [email or web address] Date: 09/12/2022 Prepared by: Camille Bal  Exercises - Pencil Pushups  - 1 x daily - 5 x weekly - 3 sets - 5 reps

## 2022-09-12 NOTE — Therapy (Signed)
OUTPATIENT PHYSICAL THERAPY NEURO TREATMENT   Patient Name: Christopher Yu MRN: [id number] DOB:[birth year], 62 y.o., male Today's Date: 09/12/2022   PCP: Thana Ates, MD REFERRING PROVIDER: Charlton Amor, PA-C  END OF SESSION:  PT End of Session - 09/12/22 0852     Visit Number 5    Number of Visits 17    Date for PT Re-Evaluation 11/07/22    Authorization Type [employer]    PT Start Time [phone]    PT Stop Time 0927    PT Time Calculation (min) 41 min    Equipment Utilized During Treatment Gait belt    Activity Tolerance Patient tolerated treatment well    Behavior During Therapy [facility] for tasks assessed/performed;Flat affect             Past Medical History:  Diagnosis Date   Anxiety    Arthritis, rheumatoid (HCC)    dx 1988   CAD (coronary artery disease), native coronary artery    Mild LAD disease with calcification noted at cath 12//27/16    Cardiomyopathy (HCC)    Cardiomyopathy (HCC)    Chronic systolic heart failure (HCC) 05/01/2015   Depression    Deviated nasal septum 06/25/2011   Headache    High cholesterol    Hyperlipidemia    Impingement syndrome of left shoulder 12/22/2017   Macular degeneration    Nausea and vomiting 08/28/2022   Rheumatoid aortitis    Sleep apnea    does not use cpap - UNABLE TO TOLERATE MASK   Sleep apnea in adult    deviated septum repaired, most recent sleep study was negative   Status post total right knee replacement 06/01/2015   Past Surgical History:  Procedure Laterality Date   ANKLE FUSION Right 05/05/2012   related to his arthritis   ANKLE FUSION Left 05/25/2013   related to his arthritis   ANKLE SURGERY Bilateral    APPLICATION OF CRANIAL NAVIGATION N/A 04/11/2022   Procedure: APPLICATION OF CRANIAL NAVIGATION;  Surgeon: Lisbeth Renshaw, MD;  Location: [facility] OR;  Service: Neurosurgery;  Laterality: N/A;   APPLICATION OF CRANIAL NAVIGATION N/A 08/08/2022   Procedure: APPLICATION OF CRANIAL NAVIGATION;   Surgeon: Lisbeth Renshaw, MD;  Location: [facility] OR;  Service: Neurosurgery;  Laterality: N/A;   CARDIAC CATHETERIZATION N/A 05/01/2015   Procedure: Left Heart Cath and Coronary Angiography;  Surgeon: Lyn Records, MD;  Location: [facility] INVASIVE CV LAB;  Service: Cardiovascular;  Laterality: N/A;   CRANIOTOMY N/A 04/11/2022   Procedure: STEREOTACTIC SUBOCCIPITAL CRANIOTOMY FOR RESECTION OF ARTERIO-VENOUS MALFORMATION;  Surgeon: Lisbeth Renshaw, MD;  Location: [facility] OR;  Service: Neurosurgery;  Laterality: N/A;   FRACTURE SURGERY     left femur fracture x 3   IR ANGIO EXTERNAL CAROTID SEL EXT CAROTID BILAT MOD SED  01/28/2022   IR ANGIO INTRA EXTRACRAN SEL INTERNAL CAROTID BILAT MOD SED  01/28/2022   IR ANGIO INTRA EXTRACRAN SEL INTERNAL CAROTID BILAT MOD SED  04/16/2022   IR ANGIO VERTEBRAL SEL VERTEBRAL BILAT MOD SED  01/28/2022   IR ANGIO VERTEBRAL SEL VERTEBRAL BILAT MOD SED  04/16/2022   KNEE ARTHROSCOPY     right x 4   LAPAROSCOPIC REVISION VENTRICULAR-PERITONEAL (V-P) SHUNT N/A 08/08/2022   Procedure: LAPAROSCOPIC VENTRICULAR-PERITONEAL (V-P) SHUNT;  Surgeon: Harriette Bouillon, MD;  Location: [facility] OR;  Service: General;  Laterality: N/A;   LUMBAR LAMINECTOMY/DECOMPRESSION MICRODISCECTOMY N/A 07/18/2022   Procedure: Repair of Pseudomeningiocele posterior;  Surgeon: Lisbeth Renshaw, MD;  Location: [facility] OR;  Service: Neurosurgery;  Laterality: N/A;   NASAL SEPTOPLASTY W/ TURBINOPLASTY  06/25/2011   Procedure: NASAL SEPTOPLASTY WITH TURBINATE REDUCTION;  Surgeon: Osborn Coho, MD;  Location: [facility] OR;  Service: ENT;  Laterality: Bilateral;   PLACEMENT OF LUMBAR DRAIN N/A 07/18/2022   Procedure: PLACEMENT OF LUMBAR DRAIN;  Surgeon: Lisbeth Renshaw, MD;  Location: [facility] OR;  Service: Neurosurgery;  Laterality: N/A;   TONSILLECTOMY     TOTAL KNEE ARTHROPLASTY Right 06/01/2015   Procedure: RIGHT TOTAL KNEE ARTHROPLASTY;  Surgeon: Kathryne Hitch, MD;  Location: [facility] ORS;  Service: Orthopedics;   Laterality: Right;  Block+general   VENTRICULOPERITONEAL SHUNT Right 08/08/2022   Procedure: LAP ASSITED SHUNT INSERTION VENTRICULAR-PERITONEAL;  Surgeon: Lisbeth Renshaw, MD;  Location: [facility] OR;  Service: Neurosurgery;  Laterality: Right;   Patient Active Problem List   Diagnosis Date Noted   Nausea and vomiting 08/28/2022   Adjustment disorder 08/06/2022   Protein-calorie malnutrition, severe 07/31/2022   Cerebellar stroke (HCC) 07/29/2022   Ischemic cerebrovascular accident (CVA) (HCC) 07/28/2022   Prolonged QT interval 07/28/2022   Pseudomeningocele 07/18/2022   Pseudomeningocele due to surgical procedure 07/18/2022   Status post craniotomy 04/11/2022   AVM (arteriovenous malformation) brain 04/11/2022   Impingement syndrome of left shoulder 12/22/2017   Impingement syndrome of right shoulder 12/22/2017   Rheumatoid arthritis involving right knee (HCC) 06/01/2015   Status post total right knee replacement 06/01/2015   Cardiomyopathy (HCC) 05/02/2015   Hyperlipidemia    Sleep apnea in adult    Rheumatoid arthritis (HCC)    Chronic systolic heart failure (HCC) 05/01/2015   CAD (coronary artery disease), native coronary artery     ONSET DATE: 08/21/2022 (referral date)  REFERRING DIAG: I63.9 (ICD-10-CM) - Cerebral infarction, unspecified  THERAPY DIAG:  Other lack of coordination  Unsteadiness on feet  Muscle weakness (generalized)  Rationale for Evaluation and Treatment: Rehabilitation  SUBJECTIVE:                                                                                                                                                                                             SUBJECTIVE STATEMENT: Patient arrives with wife, ambulating with RW.  They bring 2 SPC options with them today.  He has not been walking much w/ RW due to RW being cumbersome.  He has been dealing with RA in his shoulders and dry eyes as well.  He reports not doing his HEP.  He denies falls or  near falls.  Pt accompanied by: self and significant other - wife Christopher Yu  PERTINENT HISTORY: cerebellar AVM resection 12/23, pseudomeningocele s/p craniotomy 3/24, acute R cerebellar infarct, hydrocephalus and VP shunt  was placed on 08/08/22, dysphagia, dysarthria, aphasia  PMH: rheumatoid arthritis, bilateral ankle fusion (2014 and 2015), cardiomyopathy, CAD, R TKA, cardiomyopathy, chronic systolic heart failure, macular degeneration, OSA on CPAP  PAIN:  Are you having pain? No  PRECAUTIONS: Fall and Other: swallow precautions with modification with left head tilt and supervision if drinking thinned liquids, per caregiver not to lift, history of orthostatic hypotension resulting in falls  PATIENT GOALS: "To get back to playing golf and moving like I used to."  OBJECTIVE:   DIAGNOSTIC FINDINGS:   CT on 08/27/2022: IMPRESSION: 1. No evidence of acute infarct or hemorrhage. 2. Stable right-sided ventriculostomy catheter, tip within the right lateral ventricle. Stable size of the lateral ventricles without hydrocephalus. 3. Postsurgical changes from prior suboccipital craniectomy, with underlying encephalomalacia in the cerebellum. The pseudomeningocele seen on prior study has increased slightly in size since prior exam, of uncertain significance. 4. Decreased size of the right superior cerebellar intraparenchymal cyst, with decreased mass effect upon the fourth ventricle.  MR Brain on 07/28/2022: IMPRESSION: 1. New small focus of acute infarction in the right superior cerebellar hemisphere with surrounding area of increased T2 signal and cystic change, which is new since the prior MRI dated 07/05/2022 and suspicious for now chronic infarct. 2. Postoperative changes of prior left posterior fossa resection. Redemonstrated foci of gas within the resection cavity, fourth ventricle and extra-axial spaces, likely secondary to recent pseudomeningocele repair and/or lumbar drain  placement/removal. 3. Decreased size of the fluid collection in the dorsal soft tissues overlying the suboccipital craniectomy.  TODAY'S TREATMENT:                                                                                                                               GAIT: Gait pattern: step through pattern, decreased hip/knee flexion- Right, decreased hip/knee flexion- Left, and decreased trunk rotation Distance walked: 115' x3  Assistive device utilized: Single point cane Level of assistance: CGA Comments: Both cane options adjusted for pt height.  Pt ambulates initial warmup lap for reciprocal gait priming w/ SPC, maintains good sequencing w/ minimal sway w/ CGA.  When dual task added on second lap (naming animals, 13 named) pt has mild sway laterally for first ~70' w/ return to his typical pattern.  Third lap w/ dual task (naming food, named 10) pt has no notable sway.  Instructed pt and wife to use CGA in home when walking with SPC.  -Pencil pushups x5, pt reports double at around 8cm from nose, he further states some blurriness but also double vision at this point.  PT holds object for him due to shoulder pain.  -Reviewed saccades in sitting several reps horizontally and vertically w/ most notable corrective saccades with left saccades > standing normal BOS, posterior sway w/ horizontal saccades and mild reported dizziness  -200' x2 reading lettered post-it notes on walls using RW then [facility] for saccadic tracking and environmental scanning CGA   PATIENT EDUCATION: Education  details: Continue HEP and ambulating at home w/ wife CGA if using SPC, use RW for long and unlevel surfaces for safest mobility. Person educated: Patient and Spouse Education method: Medical illustrator Education comprehension: verbalized understanding and needs further education  HOME EXERCISE PROGRAM: You Can Walk For A Certain Length Of Time Each Day                          Walk 2 minutes 1  times per day.             Increase 2  minutes every 3 days              Work up to 30 minutes (1-2 times per day).               Example:                         Day 1-2           4-5 minutes     3 times per day                         Day 7-8           10-12 minutes 2-3 times per day                         Day 13-14       20-22 minutes 1-2 times per day  Oculomotor: Saccades    Holding two targets positioned side by side __6__ inches apart, move eyes quickly from target to target as head stays still.  Perform sitting. Repeat __10__ times per session. Do __3__ sessions per day.  Access Code: [email or web address] URL: [email or web address] Date: 09/12/2022 Prepared by: Camille Bal  Exercises - Pencil Pushups  - 1 x daily - 5 x weekly - 3 sets - 5 reps  GOALS: Goals reviewed with patient? Yes  SHORT TERM GOALS: Target date: 09/26/2022  Patient will demonstrate 100% compliance with initial HEP to continue to progress between physical therapy sessions.   Baseline: To be provided Goal status: INITIAL  2.  Patient will improve their 5x Sit to Stand score to less than 18 seconds to demonstrate a decreased risk for falls and improved LE strength.   Baseline: 22.45" without UE support/unsteady WBOS Goal status: INITIAL  3.  Patient will improve TUG score to 17 seconds or less with LRAD to indicate a decreased risk of falls and demonstrate improved overall mobility.  Baseline: 21.27" with RW Goal status: INITIAL  4.  Patient will improve gait speed to 0.87 m/s to indicate improvement to the level of community ambulator in order to participate more easily in activities outside of the home such as adapted golf.   Baseline: 0.77 m/s Goal status: INITIAL  5. Patient will improve BBS score to >/= 38/56 to demonstrate improved balance Baseline: 29/56 Goal status: INITIAL  LONG TERM GOALS: Target date: 10/24/2022  Patient will report demonstrate independence with final HEP  in order to maintain current gains and continue to progress after physical therapy discharge.   Baseline: To be provided Goal status: INITIAL  2.   Patient will improve their 5x Sit to Stand score to 15 seconds to demonstrate a decreased risk for falls and improved LE strength.   Baseline: 22.45" without UE support/unsteady WBOS Goal status: INITIAL  3.  Patient will improve TUG score to 15 seconds or less with LRAD to indicate a decreased risk of falls and demonstrate improved overall mobility.  Baseline: 21.27" with RW Goal status: INITIAL  4.  Patient will improve gait speed to 1.0 m/s to indicate improvement to the level of community ambulator in order to participate more easily in activities outside of the home such as adapted golf.   Baseline: 0.77 m/s Goal status: INITIAL  5.  Patient will improve FOTO score to 57 to achieve predicted improvements in functional mobility due to skilled physical therapy interventions to increase safety with and participation in daily activities. Baseline: 49 Goal status: INITIAL  6.  Patient will improve BBS score to >/= 47/56 to demonstrate improved balance Baseline: 29/56 Goal status: INITIAL  ASSESSMENT:  CLINICAL IMPRESSION: Patient seen for skilled PT session with emphasis on visual training w/ saccadic eye tracking and convergence.  Addition of pencil push ups made to existing HEP to address visual deficits noted in prior sessions.  Pt was instructed he could begin ambulating in home w/ CGA from wife using the [facility] to encourage more activity and ease of access to home environment with wife stating she felt comfortable with this based on pt presentation.  He required only CGA for all SPC activities this session w/ only minimal sway noted during cognitive dual tasking.  He continues to benefit from ongoing skilled PT to further address oculomotor deficits, gait training w/ LRAD, and safety with all upright mobility.   OBJECTIVE IMPAIRMENTS:  Abnormal gait, cardiopulmonary status limiting activity, decreased activity tolerance, decreased balance, decreased cognition, decreased coordination, decreased endurance, decreased knowledge of condition, decreased mobility, difficulty walking, decreased ROM, decreased strength, and impaired tone.   ACTIVITY LIMITATIONS: carrying, lifting, bending, sitting, standing, squatting, stairs, transfers, reach over head, locomotion level, and caring for others  PARTICIPATION LIMITATIONS: cleaning, driving, shopping, community activity, occupation, and yard work, Systems analyst  PERSONAL FACTORS: Past/current experiences and 3+ comorbidities: see above  are also affecting patient's functional outcome, reliance on others for transportation   REHAB POTENTIAL: Good - may be limited by complex medical history  CLINICAL DECISION MAKING: Evolving/moderate complexity  EVALUATION COMPLEXITY: Moderate  PLAN:  PT FREQUENCY: 2x/week  PT DURATION: 8 weeks  PLANNED INTERVENTIONS: Therapeutic exercises, Therapeutic activity, Neuromuscular re-education, Balance training, Gait training, Patient/Family education, Self Care, Joint mobilization, Stair training, Vestibular training, DME instructions, Wheelchair mobility training, Manual therapy, and Re-evaluation  PLAN FOR NEXT SESSION: monitor vitals/orthostatic hypotension, add to HEP for balance, high intensity gait training, balance, integrate golfing concepts as able, add in simple dual task to gait with SPC, vision exercises  Sadie Haber, PT, DPT 09/12/2022, 9:27 AM

## 2022-09-15 ENCOUNTER — Ambulatory Visit: Payer: BC Managed Care – PPO | Admitting: Speech Pathology

## 2022-09-15 ENCOUNTER — Ambulatory Visit: Payer: BC Managed Care – PPO | Admitting: Physical Therapy

## 2022-09-15 ENCOUNTER — Encounter: Payer: Self-pay | Admitting: Physical Therapy

## 2022-09-15 ENCOUNTER — Encounter: Payer: Self-pay | Admitting: Speech Pathology

## 2022-09-15 VITALS — BP 112/84 | HR 105

## 2022-09-15 DIAGNOSIS — R471 Dysarthria and anarthria: Secondary | ICD-10-CM | POA: Diagnosis not present

## 2022-09-15 DIAGNOSIS — R278 Other lack of coordination: Secondary | ICD-10-CM

## 2022-09-15 DIAGNOSIS — R2681 Unsteadiness on feet: Secondary | ICD-10-CM

## 2022-09-15 DIAGNOSIS — R262 Difficulty in walking, not elsewhere classified: Secondary | ICD-10-CM | POA: Diagnosis not present

## 2022-09-15 DIAGNOSIS — R42 Dizziness and giddiness: Secondary | ICD-10-CM | POA: Diagnosis not present

## 2022-09-15 DIAGNOSIS — R41842 Visuospatial deficit: Secondary | ICD-10-CM | POA: Diagnosis not present

## 2022-09-15 DIAGNOSIS — R1310 Dysphagia, unspecified: Secondary | ICD-10-CM

## 2022-09-15 DIAGNOSIS — M6281 Muscle weakness (generalized): Secondary | ICD-10-CM | POA: Diagnosis not present

## 2022-09-15 DIAGNOSIS — R41840 Attention and concentration deficit: Secondary | ICD-10-CM | POA: Diagnosis not present

## 2022-09-15 DIAGNOSIS — R269 Unspecified abnormalities of gait and mobility: Secondary | ICD-10-CM | POA: Diagnosis not present

## 2022-09-15 NOTE — Therapy (Signed)
OUTPATIENT PHYSICAL THERAPY NEURO TREATMENT   Patient Name: Christopher Yu MRN: [id number] DOB:10/17/1960, 62 y.o., male Today's Date: 09/15/2022   PCP: Christopher Ates, MD REFERRING PROVIDER: Charlton Amor, PA-C  END OF SESSION:  PT End of Session - 09/15/22 1148     Visit Number 6    Number of Visits 17    Date for PT Re-Evaluation 11/07/22    Authorization Type [employer]    PT Start Time 1148    PT Stop Time 1230    PT Time Calculation (min) 42 min    Equipment Utilized During Treatment Gait belt    Activity Tolerance Patient tolerated treatment well    Behavior During Therapy WFL for tasks assessed/performed;Flat affect             Past Medical History:  Diagnosis Date   Anxiety    Arthritis, rheumatoid (HCC)    dx 1988   CAD (coronary artery disease), native coronary artery    Mild LAD disease with calcification noted at cath 12//27/16    Cardiomyopathy (HCC)    Cardiomyopathy (HCC)    Chronic systolic heart failure (HCC) 05/01/2015   Depression    Deviated nasal septum 06/25/2011   Headache    High cholesterol    Hyperlipidemia    Impingement syndrome of left shoulder 12/22/2017   Macular degeneration    Nausea and vomiting 08/28/2022   Rheumatoid aortitis    Sleep apnea    does not use cpap - UNABLE TO TOLERATE MASK   Sleep apnea in adult    deviated septum repaired, most recent sleep study was negative   Status post total right knee replacement 06/01/2015   Past Surgical History:  Procedure Laterality Date   ANKLE FUSION Right 05/05/2012   related to his arthritis   ANKLE FUSION Left 05/25/2013   related to his arthritis   ANKLE SURGERY Bilateral    APPLICATION OF CRANIAL NAVIGATION N/A 04/11/2022   Procedure: APPLICATION OF CRANIAL NAVIGATION;  Surgeon: Christopher Renshaw, MD;  Location: [facility];  Service: Neurosurgery;  Laterality: N/A;   APPLICATION OF CRANIAL NAVIGATION N/A 08/08/2022   Procedure: APPLICATION OF CRANIAL NAVIGATION;   Surgeon: Christopher Renshaw, MD;  Location: [facility];  Service: Neurosurgery;  Laterality: N/A;   CARDIAC CATHETERIZATION N/A 05/01/2015   Procedure: Left Heart Cath and Coronary Angiography;  Surgeon: Christopher Records, MD;  [facility];  Service: Cardiovascular;  Laterality: N/A;   CRANIOTOMY N/A 04/11/2022   Procedure: STEREOTACTIC SUBOCCIPITAL CRANIOTOMY FOR RESECTION OF ARTERIO-VENOUS MALFORMATION;  Surgeon: Christopher Renshaw, MD;  Location: [facility];  Service: Neurosurgery;  Laterality: N/A;   FRACTURE SURGERY     left femur fracture x 3   IR ANGIO EXTERNAL CAROTID SEL EXT CAROTID BILAT MOD SED  01/28/2022   IR ANGIO INTRA EXTRACRAN SEL INTERNAL CAROTID BILAT MOD SED  01/28/2022   IR ANGIO INTRA EXTRACRAN SEL INTERNAL CAROTID BILAT MOD SED  04/16/2022   IR ANGIO VERTEBRAL SEL VERTEBRAL BILAT MOD SED  01/28/2022   IR ANGIO VERTEBRAL SEL VERTEBRAL BILAT MOD SED  04/16/2022   KNEE ARTHROSCOPY     right x 4   LAPAROSCOPIC REVISION VENTRICULAR-PERITONEAL (V-P) SHUNT N/A 08/08/2022   Procedure: LAPAROSCOPIC VENTRICULAR-PERITONEAL (V-P) SHUNT;  Surgeon: Christopher Bouillon, MD;  Location: [facility];  Service: General;  Laterality: N/A;   LUMBAR LAMINECTOMY/DECOMPRESSION MICRODISCECTOMY N/A 07/18/2022   Procedure: Repair of Pseudomeningiocele posterior;  Surgeon: Christopher Renshaw, MD;  Location: [facility];  Service: Neurosurgery;  Laterality: N/A;   NASAL SEPTOPLASTY W/ TURBINOPLASTY  06/25/2011   Procedure: NASAL SEPTOPLASTY WITH TURBINATE REDUCTION;  Surgeon: Christopher Coho, MD;  Location: [facility] OR;  Service: ENT;  Laterality: Bilateral;   PLACEMENT OF LUMBAR DRAIN N/A 07/18/2022   Procedure: PLACEMENT OF LUMBAR DRAIN;  Surgeon: Christopher Renshaw, MD;  Location: [facility];  Service: Neurosurgery;  Laterality: N/A;   TONSILLECTOMY     TOTAL KNEE ARTHROPLASTY Right 06/01/2015   Procedure: RIGHT TOTAL KNEE ARTHROPLASTY;  Surgeon: Christopher Hitch, MD;  Location: [facility];  Service: Orthopedics;   Laterality: Right;  Block+general   VENTRICULOPERITONEAL SHUNT Right 08/08/2022   Procedure: LAP ASSITED SHUNT INSERTION VENTRICULAR-PERITONEAL;  Surgeon: Christopher Renshaw, MD;  Location: [facility];  Service: Neurosurgery;  Laterality: Right;   Patient Active Problem List   Diagnosis Date Noted   Nausea and vomiting 08/28/2022   Adjustment disorder 08/06/2022   Protein-calorie malnutrition, severe 07/31/2022   Cerebellar stroke (HCC) 07/29/2022   Ischemic cerebrovascular accident (CVA) (HCC) 07/28/2022   Prolonged QT interval 07/28/2022   Pseudomeningocele 07/18/2022   Pseudomeningocele due to surgical procedure 07/18/2022   Status post craniotomy 04/11/2022   AVM (arteriovenous malformation) brain 04/11/2022   Impingement syndrome of left shoulder 12/22/2017   Impingement syndrome of right shoulder 12/22/2017   Rheumatoid arthritis involving right knee (HCC) 06/01/2015   Status post total right knee replacement 06/01/2015   Cardiomyopathy (HCC) 05/02/2015   Hyperlipidemia    Sleep apnea in adult    Rheumatoid arthritis (HCC)    Chronic systolic heart failure (HCC) 05/01/2015   CAD (coronary artery disease), native coronary artery     ONSET DATE: 08/21/2022 (referral date)  REFERRING DIAG: I63.9 (ICD-10-CM) - Cerebral infarction, unspecified  THERAPY DIAG:  Other lack of coordination  Unsteadiness on feet  Muscle weakness (generalized)  Rationale for Evaluation and Treatment: Rehabilitation  SUBJECTIVE:                                                                                                                                                                                             SUBJECTIVE STATEMENT: Patient arrives with wife, ambulating with SPC.  He reports his motion sensitivity patch has been helping.  He was able to navigate onto deck and a couple stairs at home w/ CGA from wife using SPC.  Denies falls/near falls.  Pt accompanied by: self and significant other  - wife [facility]  PERTINENT HISTORY: cerebellar AVM resection 12/23, pseudomeningocele s/p craniotomy 3/24, acute R cerebellar infarct, hydrocephalus and VP shunt was placed on 08/08/22, dysphagia, dysarthria, aphasia  PMH: rheumatoid arthritis, bilateral ankle fusion (2014 and 2015), cardiomyopathy,  CAD, R TKA, cardiomyopathy, chronic systolic heart failure, macular degeneration, OSA on CPAP  PAIN:  Are you having pain? Yes: NPRS scale: 8/10 Pain location: Bilateral shoulders and arms Pain description: throbbing in the shoulders, achy/stabbing in arms Aggravating factors: moving them Relieving factors: Tylenol  PRECAUTIONS: Fall and Other: swallow precautions with modification with left head tilt and supervision if drinking thinned liquids, per caregiver not to lift, history of orthostatic hypotension resulting in falls  PATIENT GOALS: "To get back to playing golf and moving like I used to."  OBJECTIVE:   DIAGNOSTIC FINDINGS:   CT on 08/27/2022: IMPRESSION: 1. No evidence of acute infarct or hemorrhage. 2. Stable right-sided ventriculostomy catheter, tip within the right lateral ventricle. Stable size of the lateral ventricles without hydrocephalus. 3. Postsurgical changes from prior suboccipital craniectomy, with underlying encephalomalacia in the cerebellum. The pseudomeningocele seen on prior study has increased slightly in size since prior exam, of uncertain significance. 4. Decreased size of the right superior cerebellar intraparenchymal cyst, with decreased mass effect upon the fourth ventricle.  MR Brain on 07/28/2022: IMPRESSION: 1. New small focus of acute infarction in the right superior cerebellar hemisphere with surrounding area of increased T2 signal and cystic change, which is new since the prior MRI dated 07/05/2022 and suspicious for now chronic infarct. 2. Postoperative changes of prior left posterior fossa resection. Redemonstrated foci of gas within the  resection cavity, fourth ventricle and extra-axial spaces, likely secondary to recent pseudomeningocele repair and/or lumbar drain placement/removal. 3. Decreased size of the fluid collection in the dorsal soft tissues overlying the suboccipital craniectomy.  TODAY'S TREATMENT:                                                                                                                              -Reviewed pencil pushups to improve home compliance x5 w/ therapist holding popsicle stick.  Discussed modifying task w/ wife holding stick vs having stick fixed to wall and leaning/stepping towards it w/ wife next to him.  Pt verbalizes understanding of stopping point as 2 x's vs blurry.  Standing wide stance on airex: -Horizontal and vertical saccades x1 minute each w/ intermittent CGA -Horizontal and vertical VOR x1 minute each w/ CGA due to worse sway w/ vertical VOR, limited nodding ROM  -2"-8" hurdles 4x10' using SPC and intermittent CGA, cues for sequencing AD -Seated forward bend to retrieve and place cones performed x2 rounds; pt has mild dizziness so edu provided on trialing visual fixation for compensatory strategy to override vestibular input w/ mild improvement, but some difficulty assessing pt use of strategy.  Discussed this strategy when turning as well.  PATIENT EDUCATION: Education details: Encouraged compliance to HEP.  Use RW for long and unlevel surfaces for safest mobility.  Discussed pt using SPC in home w/ distant supervision for tasks like going to the restroom to progress independence slowly.  Fall prevention edu on removing throw rugs, unblocking walkways with furniture and using good  lighting.  Slow transitions and using RW when getting up in the middle of the night or just after sleeping to prevent a fall. Person educated: Patient and Spouse Education method: Medical illustrator Education comprehension: verbalized understanding and needs further  education  HOME EXERCISE PROGRAM: You Can Walk For A Certain Length Of Time Each Day                          Walk 2 minutes 1 times per day.             Increase 2  minutes every 3 days              Work up to 30 minutes (1-2 times per day).               Example:                         Day 1-2           4-5 minutes     3 times per day                         Day 7-8           10-12 minutes 2-3 times per day                         Day 13-14       20-22 minutes 1-2 times per day  Oculomotor: Saccades    Holding two targets positioned side by side __6__ inches apart, move eyes quickly from target to target as head stays still.  Perform sitting. Repeat __10__ times per session. Do __3__ sessions per day.  Access Code: [email or web address] URL: [email or web address] Date: 09/12/2022 Prepared by: Camille Bal  Exercises - Pencil Pushups  - 1 x daily - 5 x weekly - 3 sets - 5 reps  GOALS: Goals reviewed with patient? Yes  SHORT TERM GOALS: Target date: 09/26/2022  Patient will demonstrate 100% compliance with initial HEP to continue to progress between physical therapy sessions.   Baseline: To be provided Goal status: INITIAL  2.  Patient will improve their 5x Sit to Stand score to less than 18 seconds to demonstrate a decreased risk for falls and improved LE strength.   Baseline: 22.45" without UE support/unsteady WBOS Goal status: INITIAL  3.  Patient will improve TUG score to 17 seconds or less with LRAD to indicate a decreased risk of falls and demonstrate improved overall mobility.  Baseline: 21.27" with RW Goal status: INITIAL  4.  Patient will improve gait speed to 0.87 m/s to indicate improvement to the level of community ambulator in order to participate more easily in activities outside of the home such as adapted golf.   Baseline: 0.77 m/s Goal status: INITIAL  5. Patient will improve BBS score to >/= 38/56 to demonstrate improved balance Baseline:  29/56 Goal status: INITIAL  LONG TERM GOALS: Target date: 10/24/2022  Patient will report demonstrate independence with final HEP in order to maintain current gains and continue to progress after physical therapy discharge.   Baseline: To be provided Goal status: INITIAL  2.   Patient will improve their 5x Sit to Stand score to 15 seconds to demonstrate a decreased risk for falls and improved LE strength.   Baseline: 22.45" without UE support/unsteady WBOS Goal status: INITIAL  3.  Patient will improve TUG score to 15 seconds or less with LRAD to indicate a decreased risk of falls and demonstrate improved overall mobility.  Baseline: 21.27" with RW Goal status: INITIAL  4.  Patient will improve gait speed to 1.0 m/s to indicate improvement to the level of community ambulator in order to participate more easily in activities outside of the home such as adapted golf.   Baseline: 0.77 m/s Goal status: INITIAL  5.  Patient will improve FOTO score to 57 to achieve predicted improvements in functional mobility due to skilled physical therapy interventions to increase safety with and participation in daily activities. Baseline: 49 Goal status: INITIAL  6.  Patient will improve BBS score to >/= 47/56 to demonstrate improved balance Baseline: 29/56 Goal status: INITIAL  ASSESSMENT:  CLINICAL IMPRESSION: Patient is still limited by difficulty engaging to HEP when in home setting.  Continued review of part of HEP at onset of session to promote carryover.  Extensive discussion of safety and slow progression to distant supervision using SPC inside home for common tasks like going to restroom.  Reinforced using RW for all other tasks.  Pt continues to benefit from skilled PT services to address imbalance, vestibular contributions to dizziness, and overall safety and quality of upright mobility w/ LRAD.  OBJECTIVE IMPAIRMENTS: Abnormal gait, cardiopulmonary status limiting activity, decreased  activity tolerance, decreased balance, decreased cognition, decreased coordination, decreased endurance, decreased knowledge of condition, decreased mobility, difficulty walking, decreased ROM, decreased strength, and impaired tone.   ACTIVITY LIMITATIONS: carrying, lifting, bending, sitting, standing, squatting, stairs, transfers, reach over head, locomotion level, and caring for others  PARTICIPATION LIMITATIONS: cleaning, driving, shopping, community activity, occupation, and yard work, Systems analyst  PERSONAL FACTORS: Past/current experiences and 3+ comorbidities: see above  are also affecting patient's functional outcome, reliance on others for transportation   REHAB POTENTIAL: Good - may be limited by complex medical history  CLINICAL DECISION MAKING: Evolving/moderate complexity  EVALUATION COMPLEXITY: Moderate  PLAN:  PT FREQUENCY: 2x/week  PT DURATION: 8 weeks  PLANNED INTERVENTIONS: Therapeutic exercises, Therapeutic activity, Neuromuscular re-education, Balance training, Gait training, Patient/Family education, Self Care, Joint mobilization, Stair training, Vestibular training, DME instructions, Wheelchair mobility training, Manual therapy, and Re-evaluation  PLAN FOR NEXT SESSION: monitor vitals/orthostatic hypotension, add to HEP for balance, high intensity gait training, balance, integrate golfing concepts as able, add in simple dual task to gait with SPC, vision exercises  Sadie Haber, PT, DPT 09/15/2022, 1:15 PM

## 2022-09-15 NOTE — Therapy (Signed)
OUTPATIENT SPEECH LANGUAGE PATHOLOGY TREATMENT NOTE   Patient Name: Christopher Yu MRN: [id number] DOB:1960/09/06, 62 y.o., male Today's Date: 09/15/2022  PCP: Christopher Ates, MD REFERRING PROVIDER: Charlton Amor, PA-C   END OF SESSION:   End of Session - 09/15/22 1105     Visit Number 3    Number of Visits 17    Date for SLP Re-Evaluation 10/24/22    Authorization Type [employer]    SLP Start Time 1100    SLP Stop Time  1145    SLP Time Calculation (min) 45 min    Activity Tolerance Patient tolerated treatment well             Past Medical History:  Diagnosis Date   Anxiety    Arthritis, rheumatoid (HCC)    dx 1988   CAD (coronary artery disease), native coronary artery    Mild LAD disease with calcification noted at cath 12//27/16    Cardiomyopathy (HCC)    Cardiomyopathy (HCC)    Chronic systolic heart failure (HCC) 05/01/2015   Depression    Deviated nasal septum 06/25/2011   Headache    High cholesterol    Hyperlipidemia    Impingement syndrome of left shoulder 12/22/2017   Macular degeneration    Nausea and vomiting 08/28/2022   Rheumatoid aortitis    Sleep apnea    does not use cpap - UNABLE TO TOLERATE MASK   Sleep apnea in adult    deviated septum repaired, most recent sleep study was negative   Status post total right knee replacement 06/01/2015   Past Surgical History:  Procedure Laterality Date   ANKLE FUSION Right 05/05/2012   related to his arthritis   ANKLE FUSION Left 05/25/2013   related to his arthritis   ANKLE SURGERY Bilateral    APPLICATION OF CRANIAL NAVIGATION N/A 04/11/2022   Procedure: APPLICATION OF CRANIAL NAVIGATION;  Surgeon: Christopher Renshaw, MD;  Location: [facility] OR;  Service: Neurosurgery;  Laterality: N/A;   APPLICATION OF CRANIAL NAVIGATION N/A 08/08/2022   Procedure: APPLICATION OF CRANIAL NAVIGATION;  Surgeon: Christopher Renshaw, MD;  Location: [facility] OR;  Service: Neurosurgery;  Laterality: N/A;   CARDIAC  CATHETERIZATION N/A 05/01/2015   Procedure: Left Heart Cath and Coronary Angiography;  Surgeon: Christopher Records, MD;  Location: [facility] INVASIVE CV LAB;  Service: Cardiovascular;  Laterality: N/A;   CRANIOTOMY N/A 04/11/2022   Procedure: STEREOTACTIC SUBOCCIPITAL CRANIOTOMY FOR RESECTION OF ARTERIO-VENOUS MALFORMATION;  Surgeon: Christopher Renshaw, MD;  Location: [facility] OR;  Service: Neurosurgery;  Laterality: N/A;   FRACTURE SURGERY     left femur fracture x 3   IR ANGIO EXTERNAL CAROTID SEL EXT CAROTID BILAT MOD SED  01/28/2022   IR ANGIO INTRA EXTRACRAN SEL INTERNAL CAROTID BILAT MOD SED  01/28/2022   IR ANGIO INTRA EXTRACRAN SEL INTERNAL CAROTID BILAT MOD SED  04/16/2022   IR ANGIO VERTEBRAL SEL VERTEBRAL BILAT MOD SED  01/28/2022   IR ANGIO VERTEBRAL SEL VERTEBRAL BILAT MOD SED  04/16/2022   KNEE ARTHROSCOPY     right x 4   LAPAROSCOPIC REVISION VENTRICULAR-PERITONEAL (V-P) SHUNT N/A 08/08/2022   Procedure: LAPAROSCOPIC VENTRICULAR-PERITONEAL (V-P) SHUNT;  Surgeon: Christopher Bouillon, MD;  Location: [facility] OR;  Service: General;  Laterality: N/A;   LUMBAR LAMINECTOMY/DECOMPRESSION MICRODISCECTOMY N/A 07/18/2022   Procedure: Repair of Pseudomeningiocele posterior;  Surgeon: Christopher Renshaw, MD;  Location: [facility] OR;  Service: Neurosurgery;  Laterality: N/A;   NASAL SEPTOPLASTY W/ TURBINOPLASTY  06/25/2011   Procedure: NASAL SEPTOPLASTY WITH TURBINATE REDUCTION;  Surgeon: Christopher Coho, MD;  Location: [facility] OR;  Service: ENT;  Laterality: Bilateral;   PLACEMENT OF LUMBAR DRAIN N/A 07/18/2022   Procedure: PLACEMENT OF LUMBAR DRAIN;  Surgeon: Christopher Renshaw, MD;  Location: [facility] OR;  Service: Neurosurgery;  Laterality: N/A;   TONSILLECTOMY     TOTAL KNEE ARTHROPLASTY Right 06/01/2015   Procedure: RIGHT TOTAL KNEE ARTHROPLASTY;  Surgeon: Christopher Hitch, MD;  Location: [facility] ORS;  Service: Orthopedics;  Laterality: Right;  Block+general   VENTRICULOPERITONEAL SHUNT Right 08/08/2022   Procedure: LAP ASSITED SHUNT  INSERTION VENTRICULAR-PERITONEAL;  Surgeon: Christopher Renshaw, MD;  Location: [facility] OR;  Service: Neurosurgery;  Laterality: Right;   Patient Active Problem List   Diagnosis Date Noted   Nausea and vomiting 08/28/2022   Adjustment disorder 08/06/2022   Protein-calorie malnutrition, severe 07/31/2022   Cerebellar stroke (HCC) 07/29/2022   Ischemic cerebrovascular accident (CVA) (HCC) 07/28/2022   Prolonged QT interval 07/28/2022   Pseudomeningocele 07/18/2022   Pseudomeningocele due to surgical procedure 07/18/2022   Status post craniotomy 04/11/2022   AVM (arteriovenous malformation) brain 04/11/2022   Impingement syndrome of left shoulder 12/22/2017   Impingement syndrome of right shoulder 12/22/2017   Rheumatoid arthritis involving right knee (HCC) 06/01/2015   Status post total right knee replacement 06/01/2015   Cardiomyopathy (HCC) 05/02/2015   Hyperlipidemia    Sleep apnea in adult    Rheumatoid arthritis (HCC)    Chronic systolic heart failure (HCC) 05/01/2015   CAD (coronary artery disease), native coronary artery     ONSET DATE: 07/28/22  REFERRING DIAG:  I63.9 (ICD-10-CM) - Cerebral infarction, unspecified   THERAPY DIAG:  Dysarthria and anarthria  Dysphagia, unspecified type  Rationale for Evaluation and Treatment: Rehabilitation  SUBJECTIVE:   SUBJECTIVE STATEMENT: "He is not needed the nectar thick much anymore"   PAIN:  Are you having pain? Yes NPRS scale: 8/10 Pain location: right shoulder Pain orientation: Right  PAIN TYPE: aching Pain description: constant  Aggravating factors: movement Relieving factors: rest   OBJECTIVE:   TODAY'S TREATMENT:                                                                                                                                         DATE:    09/15/22: Reviewed HEP for dysphagia - Christopher Yu demonstrated tongue press , 5 second hold with hard swallow 7/7x with rare min A. He is not completing chin tuck  against resistance or Shaker's due to pain at incision site. He is completing tongue press hard swallow regularly during the day. Christopher Yu completed HEP for dysarthria with rare min A carrying over compensations and over articulation. In structured task repeating and generating sentences with multi-syllabic words, Karmel generated 10/12 sentences intelligibly - requiring repetition on 2/10. Sentences vague and tangential at times, for example, the word representative he stated "A representative is coming to my house on Friday" Questioning and verbal cues revealed  that his family from [place] are coming to visit on Friday. Joshva also endorses difficulty processing what he wants to say. Targeted processing and carryover of dysarthria strategies generating 3 sentence description of basic words Alexandra gave me the clues "my daughters don't take this to school" or "this is what I want to eat" He required ongoing verbal A to generate salient descriptions for his listener, me who doesn't know him personally. Provided home work for this task as well. He plans on attending an award ceremony and graduation for his daughter. Education provided re: strategies and awareness of sensory overload as Jecaryous reports he frustrates easily and generated strategy of yelling at the person who " was annoying me" - Verbal cue and education to use strategy of telling [name] he may need to leave or go to a more quiet spot.  09/10/22: Initiated HEP for dysarthria - see patient instructions - Initiated training in compensations for dysarthria (slow, loud. Over articulation and pause) With cues to open mouth wide, make each sound distinct, Jacier completed sentences intelligibly 18/20.  Using compensations in structured task, generating 3 sentence description of basic object, Adriana carried over strategies to be intelligible 10/12 sentences with consistent modeling and occasional min verbal cues. Repetition required 2/12 sentences with mod I at  correcting errors to intelligible speech. In simple conversation he maintained intelligible speech with rare min A over 4 minutes. Arbie required verbal cue to look right with 2 swallows when he took tylenol  08/29/22: updated HEP. Direct instruction for swallow exercises with pt able to confirm understanding via demonstration with min-A.      PATIENT EDUCATION: Education details: POC, goals, evaluation results/recommendations Person educated: Patient and Spouse Education method: Explanation, Demonstration, Verbal cues, and Handouts Education comprehension: verbalized understanding, returned demonstration, and needs further education     GOALS: Goals reviewed with patient? Yes   SHORT TERM GOALS: Target date: 10/03/2022   Pt will teach back dysarthria strategies and compensations with mod-I Baseline: Goal status: ONGOING   2.   Pt will employ dysarthria strategies during structured practice activities resulting in intelligible production at mult-sentence level in 80% of trials Baseline:  Goal status: ONGOING   3. Pt will demonstrate use of swallow compensations/strategies with PO trials with mod-I over 2 sessions  Baseline:  Goal status: ONGOING   4.  Pt will complete HEP 6/7 days over 1 week period with mod-I Baseline:  Goal status: ONGOING     LONG TERM GOALS: Target date: 10/24/2022   Pt will complete repeat MBSS Baseline:  Goal status: ONGOING   2.  Pt will carryover dysarthria strategies/compensations during 20 minute conversation resulting in 90% ineligibility  Baseline:  Goal status: ONGOING   3.  Pt and spouse will report subjective report of reduced need for requests for repetition by 50% at home, over 1 week period Baseline:  Goal status:ONGOING   4.  Pt will report improvement via PROM by dc  Baseline:  Goal status: ONGOING     ASSESSMENT:   CLINICAL IMPRESSION: Patient is a 62 y.o. M who was seen today for dysphagia and motor speech eval s/p  cerebellar stroke. Evaluation reveals moderate dysarthria and recent MBSS demonstrates mild pharyngeal dysphagia. Pt's speech is c/b irregular imprecise articulation and distortions, volume decay on expanded utterances, and reduced use of prosody. Pt reports frustration regarding his communicative abilities, tells SLP his vocation has high level of demands for communication and he feels he will "never get back there, it's impossible." As  evaluation progressed, pt with increased frustration with SLP providing education to encourage participation and positive mindset for engagement in therapy course. Brief introduction to dysarthria strategies initiated. Reviewed instrumental swallow study results and updated pt's dysphagia HEP to accommodate preferences while targeting demonstrated deficits. Will plan to request repeat MBSS following usual completion of dysphagia HEP over 1-2 month period. Pt would benefit from skilled ST to address aforementioned deficits to improve QoL, enhance communication efficacy, and facilitate improved swallow function/safety .      OBJECTIVE IMPAIRMENTS: include dysarthria and dysphagia. These impairments are limiting patient from return to work, effectively communicating at home and in community, and safety when swallowing. Factors affecting potential to achieve goals and functional outcome are cooperation/participation level. Patient will benefit from skilled SLP services to address above impairments and improve overall function.   REHAB POTENTIAL: Good   PLAN:   SLP FREQUENCY: 2x/week   SLP DURATION: 8 weeks   PLANNED INTERVENTIONS: Aspiration precaution training, Pharyngeal strengthening exercises, Diet toleration management , Trials of upgraded texture/liquids, Cueing hierachy, Internal/external aids, Functional tasks, Multimodal communication approach, SLP instruction and feedback, Compensatory strategies, Patient/family education, and Re-evaluation     Jaymz Traywick, Radene Journey, CCC-SLP 09/15/2022, 12:04 PM

## 2022-09-15 NOTE — Patient Instructions (Addendum)
  Great job speaking with intent, and being mindful of speaking so you can be understood - this is something you will have to do for now   You can let others know you need an extra minute to process what you want to say - encourage them to interrupt you  Give [name] extra time to respond - you can ask him if he is still thinking or needs more time  Pay attention to how you are handling stimulation such as noises and lights - if you are somewhere with a lot of stimulation and you notice you are getting agitated, it may be the noises - some people find earplugs or sunglasses helpful to deal with the stimulation

## 2022-09-16 DIAGNOSIS — H04123 Dry eye syndrome of bilateral lacrimal glands: Secondary | ICD-10-CM | POA: Diagnosis not present

## 2022-09-18 ENCOUNTER — Encounter: Payer: Self-pay | Admitting: Interventional Cardiology

## 2022-09-18 ENCOUNTER — Ambulatory Visit: Payer: BC Managed Care – PPO | Admitting: Speech Pathology

## 2022-09-18 ENCOUNTER — Ambulatory Visit: Payer: BC Managed Care – PPO | Attending: Interventional Cardiology | Admitting: Interventional Cardiology

## 2022-09-18 ENCOUNTER — Ambulatory Visit: Payer: BC Managed Care – PPO | Admitting: Occupational Therapy

## 2022-09-18 ENCOUNTER — Ambulatory Visit: Payer: BC Managed Care – PPO | Admitting: Physical Therapy

## 2022-09-18 ENCOUNTER — Encounter: Payer: Self-pay | Admitting: Physical Therapy

## 2022-09-18 VITALS — BP 126/84 | HR 106 | Ht 75.0 in | Wt 185.6 lb

## 2022-09-18 VITALS — BP 120/94 | HR 105

## 2022-09-18 DIAGNOSIS — R9431 Abnormal electrocardiogram [ECG] [EKG]: Secondary | ICD-10-CM | POA: Diagnosis not present

## 2022-09-18 DIAGNOSIS — R278 Other lack of coordination: Secondary | ICD-10-CM

## 2022-09-18 DIAGNOSIS — R41840 Attention and concentration deficit: Secondary | ICD-10-CM | POA: Diagnosis not present

## 2022-09-18 DIAGNOSIS — R269 Unspecified abnormalities of gait and mobility: Secondary | ICD-10-CM

## 2022-09-18 DIAGNOSIS — I5022 Chronic systolic (congestive) heart failure: Secondary | ICD-10-CM

## 2022-09-18 DIAGNOSIS — R2681 Unsteadiness on feet: Secondary | ICD-10-CM

## 2022-09-18 DIAGNOSIS — R1310 Dysphagia, unspecified: Secondary | ICD-10-CM | POA: Diagnosis not present

## 2022-09-18 DIAGNOSIS — E782 Mixed hyperlipidemia: Secondary | ICD-10-CM

## 2022-09-18 DIAGNOSIS — R41842 Visuospatial deficit: Secondary | ICD-10-CM | POA: Diagnosis not present

## 2022-09-18 DIAGNOSIS — R42 Dizziness and giddiness: Secondary | ICD-10-CM | POA: Diagnosis not present

## 2022-09-18 DIAGNOSIS — I2510 Atherosclerotic heart disease of native coronary artery without angina pectoris: Secondary | ICD-10-CM

## 2022-09-18 DIAGNOSIS — R262 Difficulty in walking, not elsewhere classified: Secondary | ICD-10-CM | POA: Diagnosis not present

## 2022-09-18 DIAGNOSIS — M6281 Muscle weakness (generalized): Secondary | ICD-10-CM | POA: Diagnosis not present

## 2022-09-18 DIAGNOSIS — I2584 Coronary atherosclerosis due to calcified coronary lesion: Secondary | ICD-10-CM

## 2022-09-18 DIAGNOSIS — R471 Dysarthria and anarthria: Secondary | ICD-10-CM | POA: Diagnosis not present

## 2022-09-18 MED ORDER — METOPROLOL SUCCINATE ER 25 MG PO TB24: 25.0000 mg | ORAL_TABLET | Freq: Every day | ORAL | 3 refills | Status: DC

## 2022-09-18 NOTE — Therapy (Signed)
OUTPATIENT SPEECH LANGUAGE PATHOLOGY TREATMENT NOTE   Patient Name: Christopher Yu MRN: [id number] DOB:01-21-1961, 62 y.o., male Today's Date: 09/18/2022  PCP: Thana Ates, MD REFERRING PROVIDER: Charlton Amor, PA-C   END OF SESSION:   End of Session - 09/18/22 1021     Visit Number 4    Number of Visits 17    Date for SLP Re-Evaluation 10/24/22    Authorization Type [employer]    SLP Start Time 1015    SLP Stop Time  1100    SLP Time Calculation (min) 45 min    Activity Tolerance Patient tolerated treatment well              Past Medical History:  Diagnosis Date   Anxiety    Arthritis, rheumatoid (HCC)    dx 1988   CAD (coronary artery disease), native coronary artery    Mild LAD disease with calcification noted at cath 12//27/16    Cardiomyopathy (HCC)    Cardiomyopathy (HCC)    Chronic systolic heart failure (HCC) 05/01/2015   Depression    Deviated nasal septum 06/25/2011   Headache    High cholesterol    Hyperlipidemia    Impingement syndrome of left shoulder 12/22/2017   Macular degeneration    Nausea and vomiting 08/28/2022   Rheumatoid aortitis    Sleep apnea    does not use cpap - UNABLE TO TOLERATE MASK   Sleep apnea in adult    deviated septum repaired, most recent sleep study was negative   Status post total right knee replacement 06/01/2015   Past Surgical History:  Procedure Laterality Date   ANKLE FUSION Right 05/05/2012   related to his arthritis   ANKLE FUSION Left 05/25/2013   related to his arthritis   ANKLE SURGERY Bilateral    APPLICATION OF CRANIAL NAVIGATION N/A 04/11/2022   Procedure: APPLICATION OF CRANIAL NAVIGATION;  Surgeon: Lisbeth Renshaw, MD;  Location: [facility];  Service: Neurosurgery;  Laterality: N/A;   APPLICATION OF CRANIAL NAVIGATION N/A 08/08/2022   Procedure: APPLICATION OF CRANIAL NAVIGATION;  Surgeon: Lisbeth Renshaw, MD;  Location: [facility];  Service: Neurosurgery;  Laterality: N/A;   CARDIAC  CATHETERIZATION N/A 05/01/2015   Procedure: Left Heart Cath and Coronary Angiography;  Surgeon: Lyn Records, MD;  Location: [facility];  Service: Cardiovascular;  Laterality: N/A;   CRANIOTOMY N/A 04/11/2022   Procedure: STEREOTACTIC SUBOCCIPITAL CRANIOTOMY FOR RESECTION OF ARTERIO-VENOUS MALFORMATION;  Surgeon: Lisbeth Renshaw, MD;  Location: [facility];  Service: Neurosurgery;  Laterality: N/A;   FRACTURE SURGERY     left femur fracture x 3   IR ANGIO EXTERNAL CAROTID SEL EXT CAROTID BILAT MOD SED  01/28/2022   IR ANGIO INTRA EXTRACRAN SEL INTERNAL CAROTID BILAT MOD SED  01/28/2022   IR ANGIO INTRA EXTRACRAN SEL INTERNAL CAROTID BILAT MOD SED  04/16/2022   IR ANGIO VERTEBRAL SEL VERTEBRAL BILAT MOD SED  01/28/2022   IR ANGIO VERTEBRAL SEL VERTEBRAL BILAT MOD SED  04/16/2022   KNEE ARTHROSCOPY     right x 4   LAPAROSCOPIC REVISION VENTRICULAR-PERITONEAL (V-P) SHUNT N/A 08/08/2022   Procedure: LAPAROSCOPIC VENTRICULAR-PERITONEAL (V-P) SHUNT;  Surgeon: Harriette Bouillon, MD;  Location: [facility];  Service: General;  Laterality: N/A;   LUMBAR LAMINECTOMY/DECOMPRESSION MICRODISCECTOMY N/A 07/18/2022   Procedure: Repair of Pseudomeningiocele posterior;  Surgeon: Lisbeth Renshaw, MD;  Location: [facility];  Service: Neurosurgery;  Laterality: N/A;   NASAL SEPTOPLASTY W/ TURBINOPLASTY  06/25/2011   Procedure: NASAL SEPTOPLASTY WITH TURBINATE  REDUCTION;  Surgeon: Osborn Coho, MD;  Location[facility];  Service: ENT;  Laterality: Bilateral;   PLACEMENT OF LUMBAR DRAIN N/A 07/18/2022   Procedure: PLACEMENT OF LUMBAR DRAIN;  Surgeon: Lisbeth Renshaw, MD;  Location: [facility];  Service: Neurosurgery;  Laterality: N/A;   TONSILLECTOMY     TOTAL KNEE ARTHROPLASTY Right 06/01/2015   Procedure: RIGHT TOTAL KNEE ARTHROPLASTY;  Surgeon: Kathryne Hitch, MD;  Location: [facility];  Service: Orthopedics;  Laterality: Right;  Block+general   VENTRICULOPERITONEAL SHUNT Right 08/08/2022   Procedure: LAP ASSITED SHUNT  INSERTION VENTRICULAR-PERITONEAL;  Surgeon: Lisbeth Renshaw, MD;  Location: [facility];  Service: Neurosurgery;  Laterality: Right;   Patient Active Problem List   Diagnosis Date Noted   Nausea and vomiting 08/28/2022   Adjustment disorder 08/06/2022   Protein-calorie malnutrition, severe 07/31/2022   Cerebellar stroke (HCC) 07/29/2022   Ischemic cerebrovascular accident (CVA) (HCC) 07/28/2022   Prolonged QT interval 07/28/2022   Pseudomeningocele 07/18/2022   Pseudomeningocele due to surgical procedure 07/18/2022   Status post craniotomy 04/11/2022   AVM (arteriovenous malformation) brain 04/11/2022   Impingement syndrome of left shoulder 12/22/2017   Impingement syndrome of right shoulder 12/22/2017   Rheumatoid arthritis involving right knee (HCC) 06/01/2015   Status post total right knee replacement 06/01/2015   Cardiomyopathy (HCC) 05/02/2015   Hyperlipidemia    Sleep apnea in adult    Rheumatoid arthritis (HCC)    Chronic systolic heart failure (HCC) 05/01/2015   CAD (coronary artery disease), native coronary artery     ONSET DATE: 07/28/22  REFERRING DIAG:  I63.9 (ICD-10-CM) - Cerebral infarction, unspecified   THERAPY DIAG:  Dysarthria and anarthria  Dysphagia, unspecified type  Rationale for Evaluation and Treatment: Rehabilitation  SUBJECTIVE:   SUBJECTIVE STATEMENT: "He is not needed the nectar thick much anymore"   PAIN:  Are you having pain? Yes NPRS scale: 8/10 Pain location: right shoulder Pain orientation: Right  PAIN TYPE: aching Pain description: constant  Aggravating factors: movement Relieving factors: rest   OBJECTIVE:   TODAY'S TREATMENT:                                                                                                                                         DATE:    09/18/22: Pt reports ongoing daily completion of effortful swallow to address dysphagia. Subjective improvement of swallow function per pt report with reduced  instances of coughing with liquids and no longer using thickened liquids. In structured practice targeting tri-syllabic words at generative sentence level, pt with 100% accuracy. Practice naturally occurring in conversational speech results in ~70% accuracy of 3+ syllable words with increased challenges endorsed by pt. Barrier to communication at home is pt resistant to repeating himself. Education provided regarding increased complexity of using strategies in conversation to encourage pt to give himself grace when breakdowns occur. Add oral reading to HEP, if able d/t vision concerns related  to dry eyes.   09/15/22: Reviewed HEP for dysphagia - Otniel demonstrated tongue press , 5 second hold with hard swallow 7/7x with rare min A. He is not completing chin tuck against resistance or Shaker's due to pain at incision site. He is completing tongue press hard swallow regularly during the day. [name] completed HEP for dysarthria with rare min A carrying over compensations and over articulation. In structured task repeating and generating sentences with multi-syllabic words, Devarsh generated 10/12 sentences intelligibly - requiring repetition on 2/10. Sentences vague and tangential at times, for example, the word representative he stated "A representative is coming to my house on Friday" Questioning and verbal cues revealed that his family from [place] are coming to visit on Friday. Thane also endorses difficulty processing what he wants to say. Targeted processing and carryover of dysarthria strategies generating 3 sentence description of basic words Eldred gave me the clues "my daughters don't take this to school" or "this is what I want to eat" He required ongoing verbal A to generate salient descriptions for his listener, me who doesn't know him personally. Provided home work for this task as well. He plans on attending an award ceremony and graduation for his daughter. Education provided re: strategies and  awareness of sensory overload as Gaddis reports he frustrates easily and generated strategy of yelling at the person who " was annoying me" - Verbal cue and education to use strategy of telling [name] he may need to leave or go to a more quiet spot.  09/10/22: Initiated HEP for dysarthria - see patient instructions - Initiated training in compensations for dysarthria (slow, loud. Over articulation and pause) With cues to open mouth wide, make each sound distinct, Taiton completed sentences intelligibly 18/20.  Using compensations in structured task, generating 3 sentence description of basic object, Mayson carried over strategies to be intelligible 10/12 sentences with consistent modeling and occasional min verbal cues. Repetition required 2/12 sentences with mod I at correcting errors to intelligible speech. In simple conversation he maintained intelligible speech with rare min A over 4 minutes. Binnie required verbal cue to look right with 2 swallows when he took tylenol  08/29/22: updated HEP. Direct instruction for swallow exercises with pt able to confirm understanding via demonstration with min-A.      PATIENT EDUCATION: Education details: POC, goals, evaluation results/recommendations Person educated: Patient and Spouse Education method: Explanation, Demonstration, Verbal cues, and Handouts Education comprehension: verbalized understanding, returned demonstration, and needs further education     GOALS: Goals reviewed with patient? Yes   SHORT TERM GOALS: Target date: 10/03/2022   Pt will teach back dysarthria strategies and compensations with mod-I Baseline: Goal status: ONGOING   2.   Pt will employ dysarthria strategies during structured practice activities resulting in intelligible production at mult-sentence level in 80% of trials Baseline:  Goal status: ONGOING   3. Pt will demonstrate use of swallow compensations/strategies with PO trials with mod-I over 2 sessions  Baseline:  Goal  status: ONGOING   4.  Pt will complete HEP 6/7 days over 1 week period with mod-I Baseline:  Goal status: ONGOING     LONG TERM GOALS: Target date: 10/24/2022   Pt will complete repeat MBSS Baseline:  Goal status: ONGOING   2.  Pt will carryover dysarthria strategies/compensations during 20 minute conversation resulting in 90% ineligibility  Baseline:  Goal status: ONGOING   3.  Pt and spouse will report subjective report of reduced need for requests for repetition by 50%  at home, over 1 week period Baseline:  Goal status:ONGOING   4.  Pt will report improvement via PROM by dc  Baseline:  Goal status: ONGOING     ASSESSMENT:   CLINICAL IMPRESSION: Patient is a 62 y.o. M who was seen today for dysphagia and motor speech eval s/p cerebellar stroke. Evaluation reveals moderate dysarthria and recent MBSS demonstrates mild pharyngeal dysphagia. Pt's speech is c/b irregular imprecise articulation and distortions, volume decay on expanded utterances, and reduced use of prosody. Pt reports frustration regarding his communicative abilities, tells SLP his vocation has high level of demands for communication and he feels he will "never get back there, it's impossible." As evaluation progressed, pt with increased frustration with SLP providing education to encourage participation and positive mindset for engagement in therapy course. Brief introduction to dysarthria strategies initiated. Reviewed instrumental swallow study results and updated pt's dysphagia HEP to accommodate preferences while targeting demonstrated deficits. Will plan to request repeat MBSS following usual completion of dysphagia HEP over 1-2 month period. Pt would benefit from skilled ST to address aforementioned deficits to improve QoL, enhance communication efficacy, and facilitate improved swallow function/safety .      OBJECTIVE IMPAIRMENTS: include dysarthria and dysphagia. These impairments are limiting patient from return  to work, effectively communicating at home and in community, and safety when swallowing. Factors affecting potential to achieve goals and functional outcome are cooperation/participation level. Patient will benefit from skilled SLP services to address above impairments and improve overall function.   REHAB POTENTIAL: Good   PLAN:   SLP FREQUENCY: 2x/week   SLP DURATION: 8 weeks   PLANNED INTERVENTIONS: Aspiration precaution training, Pharyngeal strengthening exercises, Diet toleration management , Trials of upgraded texture/liquids, Cueing hierachy, Internal/external aids, Functional tasks, Multimodal communication approach, SLP instruction and feedback, Compensatory strategies, Patient/family education, and Re-evaluation     Maia Breslow, CCC-SLP 09/18/2022, 11:57 AM

## 2022-09-18 NOTE — Patient Instructions (Addendum)
Medication Instructions:  Your physician has recommended you make the following change in your medication:  Resume metoprolol succinate 25 mg by mouth daily   *If you need a refill on your cardiac medications before your next appointment, please call your pharmacy*   Lab Work: none If you have labs (blood work) drawn today and your tests are completely normal, you will receive your results only by: MyChart Message (if you have MyChart) OR A paper copy in the mail If you have any lab test that is abnormal or we need to change your treatment, we will call you to review the results.   Testing/Procedures: none   Follow-Up: At [facility], you and your health needs are our priority.  As part of our continuing mission to provide you with exceptional heart care, we have created designated Provider Care Teams.  These Care Teams include your primary Cardiologist (physician) and Advanced Practice Providers (APPs -  Physician Assistants and Nurse Practitioners) who all work together to provide you with the care you need, when you need it.  We recommend signing up for the patient portal called "MyChart".  Sign up information is provided on this After Visit Summary.  MyChart is used to connect with patients for Virtual Visits (Telemedicine).  Patients are able to view lab/test results, encounter notes, upcoming appointments, etc.  Non-urgent messages can be sent to your provider as well.   To learn more about what you can do with MyChart, go to [email or web address].    Your next appointment:   12 month(s)  Provider:   Lance Muss, MD     Other Instructions

## 2022-09-18 NOTE — Therapy (Signed)
OUTPATIENT OCCUPATIONAL THERAPY NEURO TREATMENT  Patient Name: Christopher Yu MRN: [id number] DOB:[birth year], 62 y.o., male Today's Date: 09/18/2022  PCP: Thana Ates, MD REFERRING PROVIDER: Charlton Amor, PA-C  END OF SESSION:  OT End of Session - 09/18/22 0853     Visit Number 5    Number of Visits 20    Date for OT Re-Evaluation 06/20/22    Authorization Type [employer]    OT Start Time 0930    OT Stop Time 1015    OT Time Calculation (min) 45 min    Equipment Utilized During Treatment Gait Belt    Activity Tolerance Patient tolerated treatment well;Patient limited by fatigue    Behavior During Therapy [facility] for tasks assessed/performed;Flat affect             Past Medical History:  Diagnosis Date   Anxiety    Arthritis, rheumatoid (HCC)    dx 1988   CAD (coronary artery disease), native coronary artery    Mild LAD disease with calcification noted at cath 12//27/16    Cardiomyopathy (HCC)    Cardiomyopathy (HCC)    Chronic systolic heart failure (HCC) 05/01/2015   Depression    Deviated nasal septum 06/25/2011   Headache    High cholesterol    Hyperlipidemia    Impingement syndrome of left shoulder 12/22/2017   Macular degeneration    Nausea and vomiting 08/28/2022   Rheumatoid aortitis    Sleep apnea    does not use cpap - UNABLE TO TOLERATE MASK   Sleep apnea in adult    deviated septum repaired, most recent sleep study was negative   Status post total right knee replacement 06/01/2015   Past Surgical History:  Procedure Laterality Date   ANKLE FUSION Right 05/05/2012   related to his arthritis   ANKLE FUSION Left 05/25/2013   related to his arthritis   ANKLE SURGERY Bilateral    APPLICATION OF CRANIAL NAVIGATION N/A 04/11/2022   Procedure: APPLICATION OF CRANIAL NAVIGATION;  Surgeon: Lisbeth Renshaw, MD;  Location: [facility] OR;  Service: Neurosurgery;  Laterality: N/A;   APPLICATION OF CRANIAL NAVIGATION N/A 08/08/2022   Procedure:  APPLICATION OF CRANIAL NAVIGATION;  Surgeon: Lisbeth Renshaw, MD;  Location: [facility] OR;  Service: Neurosurgery;  Laterality: N/A;   CARDIAC CATHETERIZATION N/A 05/01/2015   Procedure: Left Heart Cath and Coronary Angiography;  Surgeon: Lyn Records, MD;  Location: [facility] INVASIVE CV LAB;  Service: Cardiovascular;  Laterality: N/A;   CRANIOTOMY N/A 04/11/2022   Procedure: STEREOTACTIC SUBOCCIPITAL CRANIOTOMY FOR RESECTION OF ARTERIO-VENOUS MALFORMATION;  Surgeon: Lisbeth Renshaw, MD;  Location: [facility] OR;  Service: Neurosurgery;  Laterality: N/A;   FRACTURE SURGERY     left femur fracture x 3   IR ANGIO EXTERNAL CAROTID SEL EXT CAROTID BILAT MOD SED  01/28/2022   IR ANGIO INTRA EXTRACRAN SEL INTERNAL CAROTID BILAT MOD SED  01/28/2022   IR ANGIO INTRA EXTRACRAN SEL INTERNAL CAROTID BILAT MOD SED  04/16/2022   IR ANGIO VERTEBRAL SEL VERTEBRAL BILAT MOD SED  01/28/2022   IR ANGIO VERTEBRAL SEL VERTEBRAL BILAT MOD SED  04/16/2022   KNEE ARTHROSCOPY     right x 4   LAPAROSCOPIC REVISION VENTRICULAR-PERITONEAL (V-P) SHUNT N/A 08/08/2022   Procedure: LAPAROSCOPIC VENTRICULAR-PERITONEAL (V-P) SHUNT;  Surgeon: Harriette Bouillon, MD;  Location: [facility] OR;  Service: General;  Laterality: N/A;   LUMBAR LAMINECTOMY/DECOMPRESSION MICRODISCECTOMY N/A 07/18/2022   Procedure: Repair of Pseudomeningiocele posterior;  Surgeon: Lisbeth Renshaw, MD;  Location: [facility] OR;  Service: Neurosurgery;  Laterality: N/A;   NASAL SEPTOPLASTY W/ TURBINOPLASTY  06/25/2011   Procedure: NASAL SEPTOPLASTY WITH TURBINATE REDUCTION;  Surgeon: Osborn Coho, MD;  Location[facility] OR;  Service: ENT;  Laterality: Bilateral;   PLACEMENT OF LUMBAR DRAIN N/A 07/18/2022   Procedure: PLACEMENT OF LUMBAR DRAIN;  Surgeon: Lisbeth Renshaw, MD;  Location: [facility] OR;  Service: Neurosurgery;  Laterality: N/A;   TONSILLECTOMY     TOTAL KNEE ARTHROPLASTY Right 06/01/2015   Procedure: RIGHT TOTAL KNEE ARTHROPLASTY;  Surgeon: Kathryne Hitch, MD;  Location: [facility]  ORS;  Service: Orthopedics;  Laterality: Right;  Block+general   VENTRICULOPERITONEAL SHUNT Right 08/08/2022   Procedure: LAP ASSITED SHUNT INSERTION VENTRICULAR-PERITONEAL;  Surgeon: Lisbeth Renshaw, MD;  Location: [facility] OR;  Service: Neurosurgery;  Laterality: Right;   Patient Active Problem List   Diagnosis Date Noted   Nausea and vomiting 08/28/2022   Adjustment disorder 08/06/2022   Protein-calorie malnutrition, severe 07/31/2022   Cerebellar stroke (HCC) 07/29/2022   Ischemic cerebrovascular accident (CVA) (HCC) 07/28/2022   Prolonged QT interval 07/28/2022   Pseudomeningocele 07/18/2022   Pseudomeningocele due to surgical procedure 07/18/2022   Status post craniotomy 04/11/2022   AVM (arteriovenous malformation) brain 04/11/2022   Impingement syndrome of left shoulder 12/22/2017   Impingement syndrome of right shoulder 12/22/2017   Rheumatoid arthritis involving right knee (HCC) 06/01/2015   Status post total right knee replacement 06/01/2015   Cardiomyopathy (HCC) 05/02/2015   Hyperlipidemia    Sleep apnea in adult    Rheumatoid arthritis (HCC)    Chronic systolic heart failure (HCC) 05/01/2015   CAD (coronary artery disease), native coronary artery     ONSET DATE: 08/21/2022 (date of referral)  REFERRING DIAG: I63.9 (ICD-10-CM) - Cerebral infarction, unspecified  THERAPY DIAG:  Muscle weakness (generalized)  Other lack of coordination  Unsteadiness on feet  Rationale for Evaluation and Treatment: Rehabilitation  SUBJECTIVE:   SUBJECTIVE STATEMENT: Pateint reports getting out to his daughter's awards ceremony this week and an appt with the the ophthalmologist this week.  He was prescribed eye drops at the appt and has been using them the past 2 days with minimal change according to patient.  He is going to try this treatment further and has a follow-up in about a month.  The ophthalmologist suggested maybe seeing MD re: his thyroid due to some observations about how  he keeps his eyes open so wide.  He has an appointment with the cardiologist today also.   He is on a prednisone taper since on Monday due to flare up of arthritis and has had with some improvement in RA affecting shoulders (ie increased pain).  He has a refillable prescription for prednisone that he uses when he has flare ups.  Pt accompanied by: self and significant other, [name]  PERTINENT HISTORY: CT/MRI showed new small focus of acute infarction in the right superior cerebellar hemisphere. Cerebellar AVM resection 12/23, pseudomeningocele s/p craniotomy 3/24, acute R cerebellar infarct, hydrocephalus and VP shunt was placed on 08/08/22, dysphagia, dysarthria, aphasia. PMH: rheumatoid arthritis, bilateral ankle fusion (2014 and 2015), cardiomyopathy, CAD, R TKA, cardiomyopathy, chronic systolic heart failure, macular degeneration, OSA on CPAP  PRECAUTIONS: Fall and Other: swallow precautions with modification with left head tilt and supervision if drinking thinned liquids, per caregiver not to lift, history of orthostatic hypotension resulting in falls; no driving  WEIGHT BEARING RESTRICTIONS: No  PAIN:  Are you having pain? Rt shoulder 8/10 from RA flare up, constant (premorbid)   FALLS: Has patient fallen in last 6  months? Yes. Number of falls 3 - after the first surgery, got dizzy and fell, using the walker, when up and moving, struggled with orthostatic hypotension but medical team has since decreased BP medications  LIVING ENVIRONMENT: Lives with: lives with their spouse, two teenage daughters (also has dtr who lives in [place]), and 175 lb dog Lives in: House/apartment - Able to live on main floor Stairs: Yes: Internal: ~14 steps; on right going up and External: 3 steps; on right going up Has following equipment at home: Dan Humphreys - 2 wheeled and Wheelchair (manual) Bathroom setup: [employer], regular height toilet  PLOF: Prior to December, pt completely independent with ADL/IADLs,  ambulatory without device, pt enjoys golfing, driving, 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  PATIENT GOALS: Return to golf, improve vision for work, travel in June for daughter's outdoor wedding (be off the walker ideally)  OBJECTIVE: (results from evaluation unless otherwise stated)  HAND DOMINANCE: Right  ADLs: Eating: Independent with self-feeding, wife cuts items due to impaired coordination per her report Grooming: Independent mostly, but reports it's painful to brush hair from surgical procedure in December UB Dressing: Supervision, increased time and wife present if needed LB Dressing: Supervision increased time and wife present if needed. Sometimes sits to don pants Toileting: Supervision, more effort, wife there in case pt loses balance Bathing: Supervision for balance Tub Shower transfers: Supervision likely; however, sponge bath currently due to hand held shower head breaking and plumber coming 5/1.  Equipment:  Toilet riser with armrests , shower chair, bedrail on bed  IADLs: Shopping: Currently unable, previously independent Light housekeeping: Currently unable, previously did laundry Meal Prep: Currently unable, but previously assisted in task Community mobility: and household, pt utilizing rolling walker at all times. Typically sedentary in recliner most of the day. Gets up for bathroom.  Medication management: Wife Presenter, broadcasting: Currently unable, pt typically did before Handwriting: 75% legible and Mild micrographia   MOBILITY STATUS:  Pt ambulatory with RW, wife providing supervision  at all times  POSTURE COMMENTS:  rounded shoulders Sitting balance: Moves/returns truncal midpoint 1-2 inches in multiple planes - Wife describes she believes it is more of a coordination issue, overshooting and then losing balance.   ACTIVITY TOLERANCE: Activity tolerance: Subjective report of significant fatigue  FUNCTIONAL OUTCOME MEASURES: FOTO: 58 score  UPPER  EXTREMITY ROM:    Active ROM Right eval Left eval  Shoulder flexion [facility]  Shoulder abduction [facility]  Shoulder adduction [facility] WFL  Shoulder extension [facility]  Shoulder internal rotation    Shoulder external rotation    Elbow flexion WFL WFL  Elbow extension [facility] WFL  Wrist flexion    Wrist extension    Wrist ulnar deviation    Wrist radial deviation    Wrist pronation    Wrist supination    (Blank rows = not tested)  UPPER EXTREMITY MMT:     MMT Right eval Left eval  Shoulder flexion 4+/5 4+/5  Shoulder abduction    Shoulder adduction    Shoulder extension    Shoulder internal rotation    Shoulder external rotation    Middle trapezius    Lower trapezius    Elbow flexion 4+/5 4+/5  Elbow extension 4+/5 4+/5  Wrist flexion    Wrist extension    Wrist ulnar deviation    Wrist radial deviation    Wrist pronation    Wrist supination    (Blank rows = not tested)  HAND FUNCTION: Grip strength: Right:  67.2 lbs; Left: 66.3 lbs and 3 point pinch: Right: 12 lbs, Left: 15 lbs  COORDINATION: 9 Hole Peg test: Right: 31 sec; Left: 59 sec Box and Blocks:  Right 27 blocks, Left 19blocks  SENSATION: WFL  EDEMA: None  MUSCLE TONE: WFL  COGNITION: Overall cognitive status:  Grossly WFL, possible for attention deficits.  VISION: Subjective report: Pt reports some jerky vision since December Baseline vision: Wears glasses all the time Visual history: cataracts and macular degeneration  VISION ASSESSMENT: Impaired Eye alignment: Impaired: Right eye seemed to have amblyopia-type appearance with visual pursuits  Reading acuity: Pt reports difficulty reading instructions at this time, he reports all letters merge together Tracking/Visual pursuits: Right eye appears to be slower than left Saccades: overshoots - right eye from temporal to nasal Convergence: WFL Visual Fields: Grossly intact; however, decreased peripheral vision to right side when provided with moving  target with detection of object at 30* Diplopia assessment: When given macular degeneration Amsler grid, pt reports double vision of right eye when left eye occluded. Quadrants - WFL   Patient has difficulty with following activities due to following visual impairments: Work related tasks  PERCEPTION: WFL  PRAXIS: WFL  OBSERVATIONS: Patient arrives to session ambulating with rolling walker. Patient wife primarily speaks during the session given patient's dysarthria. Per caregiver, patient had a surgery to address AVM and then had pseudomengiocele and underwent 3 surgeries as a result of complications. Per report, patient was D/C from hospital and then returned with symptoms of acute stroke. Prior to December patient was fully independent, working full time in IT, and golfing regularly every weekend; however, patient is now fully supervised for all ADLs, not working, and now mobilizing either via manual W/C or walker. Patient reports increased nausea disrupting appetite. Family reports going to the ED on last Wednesday due to new reports of nausea and double vision and there was concern for another stroke; however, family decided to the return home when CT came back clear and did not want to go through additional MRI. Per family, inpatient rehab physician aware of this and recommended they call their neurosurgeon for follow up as well and did not advise against continuing outpatient therapy. Family also expresses concern in not knowing how to truly recognize a stroke as patient now has such extensive deficits. Family also reports that a therapist in inpatient rehab noticed unique eye movements and recommend they also see a therapist that could help address vestibular concerns. Wife showing OT video today. Video showed pt performing visual pursuits with jerky eye movement going from left to right sides, right eye impacted.  09/09/22: Pt received from PT lying on mat table. Pt stating taking a rest break. Pt  denies pain or dizziness.    TODAY'S TREATMENT:                                                                                                                              DATE:  09/18/22  Patient engaged in 3 distinct activities today all combined with standing activity tolerance.  Patient in need of frequent seated rest breaks throughout session with O2 sats above 95% but heart rate varying from 110-120+ bpm but decreased to less than 100 with appropriate seated rest breaks.  Patient has had an exacerbation of shoulder pain due to arthritis and standing tabletop range of motion activity completed with patient encouraged to work on drawing the letters of the alphabet with each hand rolling over a ball.  Sticky note is used to encourage patient to reach past midline of the tabletop and activity completed with right and left upper extremity with rest breaks in between.  OT placed 6 BlazePods in vertically on the mirror in front of patient and had patient tap pods with palm of hand both hands as BLUE pods lit up using visual distracting mode for improved processing, scanning and locating of items, reaction time, upper extremity range of motion, gross motor coordination, and visual scanning. Patient 100% successful x 2 repetitions with rest break in between each 2 minute session   1) 40 hits with 2.813 sec. reaction time                             2) 47 hits with 2.380 sec. reaction time Although he was able to reach with either hand to tap the correct color some assistance was given to tap pods if his 'touch' did not register.  Cues provided to use a solid full hand tap to increased recognition of touch.  Finally, patient was engaged in some simulated golfing activities.  He had brought his own putter today.  Therapist set up a towel on the floor to simulate grassy surface and placed a weighted cup to the edge of the towel for him to putt towards.  Patient actually putted 3 out of 3 yellow golf balls  into the cup placed at different locations before and needing a seated rest break.  Other simulated golfing activities included removing golf tee pegs from resisted Theraputty 1 other time to simulate removing golf tees on the course.  After removing 6 golf teas through repeated squatting, patient's heart rate increased to 122-124 with quick decrease in heart rate during seated rest break.  PATIENT EDUCATION: Education details: see above   HOME EXERCISE PROGRAM: 09/02/22: Coordination HEP 09/04/22: Modified coordination HEP (for vision) and vision HEP 09/18/22: No hand out provided but encouraged to work on standing activities throughout the day and combine with table top ball rolling for shoulder ROM.  GOALS: Goals reviewed with patient? Yes  SHORT TERM GOALS: Target date: 09/30/2022   Pt will be independent in LUE HEP. Baseline: Initiated Goal status: IN PROGRESS  2.  Pt will demonstrate improved fine motor coordination for ADLs as evidenced by decreasing 9 hole peg test score for left hand by >/=5 secs  Baseline: Lt - 59 secs Goal status: IN PROGRESS  3.  Pt will be able to place at least 3 blocks using left hand with completion of Box and Blocks test.  Baseline: Lt- 19 blocks Goal status: IN PROGRESS  4.  Pt will tolerate >/=10 minutes of standing activity to simulate activity tolerance needed for ADL/IADL management. Baseline: Subjective description of sitting down for most tasks Goal status: IN PROGRESS  5.  Pt will write a sentence with no significant decrease in size and maintain 100% legibility.  Baseline: 75% legibility and mild  micrographia Goal status: INITIAL  6.  Pt will improve visual closure skills to recognize and complete visual patterns or missing parts of visual stimuli, enhancing visual perception and problem-solving abilities. Baseline: Overshooting eye movements, impaired peripheral vision, merging of letters per subjective report Goal status: IN  PROGRESS  LONG TERM GOALS: Target date: 11/11/2022   Pt will complete FOTO assessment at time of discharge scoring 68 or greater indicating functional progression with ADL and IADL completion. Baseline: 58  Goal status: INITIAL  2.  Pt will demonstrate improved fine motor coordination for ADLs as evidenced by decreasing 9 hole peg test score for left hand by >/=10 secs  Baseline: Lt - 59 secs Goal status: IN PROGRESS  3.  Pt will be able to place at least 5 blocks using left hand with completion of Box and Blocks test.  Baseline: Lt - 19 blocks Goal status: IN PROGRESS  4.  Pt will improve dynamic standing balance to Good (stand independently unsupported, able to weight shift and cross midline moderately) as needed for managing ADL/IADL tasks. Baseline: Subjective description of Fair (stand independently unsupported, weight shift, and reach ipsilaterally, LOB when crossing midline) with rolling walker Goal status: IN PROGRESS  5.  Pt will verbalize at least two visual compensatory methods to include adaptive equipment as needed independently. Baseline:  Goal status: IN PROGRESS   ASSESSMENT:  CLINICAL IMPRESSION: Pt was seen after physical therapy today.  He was given appropriate rest break before and during session as he did require frequent rest breaks with standing activities.  Patient had good participation in tabletop shoulder range of motion activities prior to vertical reaching for blaze pods without emphasis being placed on using 1 hand over the other.  Patient's O2 sats remained above 95% with activities with heart rate increasing to 110-122 with activities including 2 minutes of standing with blaze pods and squatting activities to pick up golf tees respectively.  PERFORMANCE DEFICITS: in functional skills including ADLs, IADLs, coordination, dexterity, pain, Fine motor control, Gross motor control, mobility, balance, body mechanics, endurance, vision, and UE functional use,  cognitive skills including attention, energy/drive, and memory, and psychosocial skills including coping strategies, environmental adaptation, habits, and routines and behaviors.   IMPAIRMENTS: are limiting patient from ADLs, IADLs, work, and leisure.   CO-MORBIDITIES: may have co-morbidities  that affects occupational performance. Patient will benefit from skilled OT to address above impairments and improve overall function.  REHAB POTENTIAL: Good  PLAN:  OT FREQUENCY: 2x/week  OT DURATION: 10 weeks (anticipate 8 weeks, but pt going out of town for the summer months)  PLANNED INTERVENTIONS: self care/ADL training, therapeutic exercise, therapeutic activity, neuromuscular re-education, balance training, functional mobility training, electrical stimulation, fluidotherapy, moist heat, cryotherapy, patient/family education, cognitive remediation/compensation, visual/perceptual remediation/compensation, psychosocial skills training, energy conservation, coping strategies training, DME and/or AE instructions, and Re-evaluation  RECOMMENDED OTHER SERVICES: Follow-up with neuro ophthalmologist completed. Pt reports having an "eye doctor" that he goes to annually.  CONSULTED AND AGREED WITH PLAN OF CARE: Patient and family member/caregiver  PLAN FOR NEXT SESSION:  Continue standing tolerance for daily activities ie) golfing with personal putter, meal prep (as interest allows), vary Blaze Pod placement, continue progression 42f shoulder ROM with table top ball activities and vertical surfaces as tolerated.  Reassess Short term goals for 09/30/22.  May also consider previous recommendations including toss ball back and forth if shoulder pain allows, continue visual scanning, desensitization possibly for scalp, balance   Victorino Sparrow, OT 09/18/2022, 11:53 AM

## 2022-09-18 NOTE — Patient Instructions (Signed)
Access Code: [email or web address] URL: [email or web address] Date: 09/18/2022 Prepared by: Camille Bal  Exercises - Pencil Pushups  - 1 x daily - 5 x weekly - 3 sets - 5 reps - Corner Balance Feet Together With Eyes Closed  - 1 x daily - 5 x weekly - 1 sets - 3-4 reps - 30-45 seconds hold - Standing Tandem Balance with Counter Support  - 1 x daily - 5 x weekly - 1 sets - 2-3 reps - 45 seconds hold

## 2022-09-18 NOTE — Progress Notes (Signed)
Cardiology Office Note   Date:  09/18/2022   ID:  Christopher Yu, Christopher Yu Jun 19, 1960, MRN [id number]  PCP:  Thana Ates, MD    No chief complaint on file.  Chronic systolic heart failure  Wt Readings from Last 3 Encounters:  09/18/22 185 lb 9.6 oz (84.2 kg)  09/08/22 186 lb (84.4 kg)  08/27/22 194 lb 3.6 oz (88.1 kg)       History of Present Illness: Christopher Yu is a 62 y.o. male  with a history of rheumatoid arthritis and chronic systolic heart failure.  Ejection fraction has been documented in the 35 to 40% range in the past.   He has had stress testing done in the past.  He does carry a diagnosis of coronary artery disease on his problem list from Dr. Donnie Aho.  ECho in 2016.  Stress test in 2016.  Cath in 2016 was done and there was no significant blockage.   Had problems with a statin in the past.  He does not remember the name.  He can get his cholesterol down with diet.      He has been on medical therapy for his cardiomyopathy for several years.  He has hoped to decrease his medications and we have talked about maintaining ejection fraction by continuing medications, in the past.   In 2022, he has gained weight.  He plays golf regularly.    Brain AVM surgery in 12/23.  He has had a stroke since then and two repeat brain surgeries. He was in rehab for 1 month at [facility] rehab.    Wife reports that his HR increases quickly with therapy.  He has some DOE with minimal activity.  Lisinopril was stopped after surgery in 12/23. Metoprolol was stopped after second surgery due to low BP.    Denies : Chest pain. Leg edema. Nitroglycerin use. Orthopnea. Palpitations. Paroxysmal nocturnal dyspnea. Syncope.    Past Medical History:  Diagnosis Date   Anxiety    Arthritis, rheumatoid (HCC)    dx 1988   CAD (coronary artery disease), native coronary artery    Mild LAD disease with calcification noted at cath 12//27/16    Cardiomyopathy (HCC)     Cardiomyopathy (HCC)    Chronic systolic heart failure (HCC) 05/01/2015   Depression    Deviated nasal septum 06/25/2011   Headache    High cholesterol    Hyperlipidemia    Impingement syndrome of left shoulder 12/22/2017   Macular degeneration    Nausea and vomiting 08/28/2022   Rheumatoid aortitis    Sleep apnea    does not use cpap - UNABLE TO TOLERATE MASK   Sleep apnea in adult    deviated septum repaired, most recent sleep study was negative   Status post total right knee replacement 06/01/2015    Past Surgical History:  Procedure Laterality Date   ANKLE FUSION Right 05/05/2012   related to his arthritis   ANKLE FUSION Left 05/25/2013   related to his arthritis   ANKLE SURGERY Bilateral    APPLICATION OF CRANIAL NAVIGATION N/A 04/11/2022   Procedure: APPLICATION OF CRANIAL NAVIGATION;  Surgeon: Lisbeth Renshaw, MD;  Location: [facility] OR;  Service: Neurosurgery;  Laterality: N/A;   APPLICATION OF CRANIAL NAVIGATION N/A 08/08/2022   Procedure: APPLICATION OF CRANIAL NAVIGATION;  Surgeon: Lisbeth Renshaw, MD;  Location: [facility] OR;  Service: Neurosurgery;  Laterality: N/A;   CARDIAC CATHETERIZATION N/A 05/01/2015   Procedure: Left Heart Cath and Coronary Angiography;  Surgeon: Sherilyn Cooter  Malissa Hippo, MD;  Location: [facility];  Service: Cardiovascular;  Laterality: N/A;   CRANIOTOMY N/A 04/11/2022   Procedure: STEREOTACTIC SUBOCCIPITAL CRANIOTOMY FOR RESECTION OF ARTERIO-VENOUS MALFORMATION;  Surgeon: Lisbeth Renshaw, MD;  Location: [facility] OR;  Service: Neurosurgery;  Laterality: N/A;   FRACTURE SURGERY     left femur fracture x 3   IR ANGIO EXTERNAL CAROTID SEL EXT CAROTID BILAT MOD SED  01/28/2022   IR ANGIO INTRA EXTRACRAN SEL INTERNAL CAROTID BILAT MOD SED  01/28/2022   IR ANGIO INTRA EXTRACRAN SEL INTERNAL CAROTID BILAT MOD SED  04/16/2022   IR ANGIO VERTEBRAL SEL VERTEBRAL BILAT MOD SED  01/28/2022   IR ANGIO VERTEBRAL SEL VERTEBRAL BILAT MOD SED  04/16/2022   KNEE ARTHROSCOPY      right x 4   LAPAROSCOPIC REVISION VENTRICULAR-PERITONEAL (V-P) SHUNT N/A 08/08/2022   Procedure: LAPAROSCOPIC VENTRICULAR-PERITONEAL (V-P) SHUNT;  Surgeon: Harriette Bouillon, MD;  Location: [facility] OR;  Service: General;  Laterality: N/A;   LUMBAR LAMINECTOMY/DECOMPRESSION MICRODISCECTOMY N/A 07/18/2022   Procedure: Repair of Pseudomeningiocele posterior;  Surgeon: Lisbeth Renshaw, MD;  Location: [facility];  Service: Neurosurgery;  Laterality: N/A;   NASAL SEPTOPLASTY W/ TURBINOPLASTY  06/25/2011   Procedure: NASAL SEPTOPLASTY WITH TURBINATE REDUCTION;  Surgeon: Osborn Coho, MD;  Location: [facility] OR;  Service: ENT;  Laterality: Bilateral;   PLACEMENT OF LUMBAR DRAIN N/A 07/18/2022   Procedure: PLACEMENT OF LUMBAR DRAIN;  Surgeon: Lisbeth Renshaw, MD;  Location: [facility];  Service: Neurosurgery;  Laterality: N/A;   TONSILLECTOMY     TOTAL KNEE ARTHROPLASTY Right 06/01/2015   Procedure: RIGHT TOTAL KNEE ARTHROPLASTY;  Surgeon: Kathryne Hitch, MD;  Location: [facility] ORS;  Service: Orthopedics;  Laterality: Right;  Block+general   VENTRICULOPERITONEAL SHUNT Right 08/08/2022   Procedure: LAP ASSITED SHUNT INSERTION VENTRICULAR-PERITONEAL;  Surgeon: Lisbeth Renshaw, MD;  Location: [facility];  Service: Neurosurgery;  Laterality: Right;     Current Outpatient Medications  Medication Sig Dispense Refill   acetaminophen (TYLENOL) 325 MG tablet Take 2 tablets (650 mg total) by mouth 3 (three) times daily. (Patient taking differently: Take 650 mg by mouth as needed.)     ALDACTONE 25 MG tablet Take 0.5 tablets (12.5 mg total) by mouth daily. 30 tablet 11   artificial tears (LACRILUBE) OINT ophthalmic ointment Place into both eyes 2 (two) times daily.     aspirin EC 81 MG tablet Take 1 tablet (81 mg total) by mouth daily. Swallow whole. 30 tablet 12   calcium carbonate (TUMS - DOSED IN MG ELEMENTAL CALCIUM) 500 MG chewable tablet Chew 1 tablet (200 mg of elemental calcium total) by mouth daily with supper.  (Patient taking differently: Chew 1 tablet by mouth as needed.)     Cholecalciferol (VITAMIN D3) 50 MCG (2000 UT) TABS Take 2,000 Units by mouth daily.     diazepam (VALIUM) 5 MG tablet Take 1 tablet (5 mg total) by mouth every 8 (eight) hours as needed for muscle spasms. 30 tablet 0   diclofenac Sodium (VOLTAREN) 1 % GEL Apply 2 g topically 4 (four) times daily. (Patient taking differently: Apply 2 g topically as needed.)     methotrexate (RHEUMATREX) 2.5 MG tablet Take 20 mg by mouth once a week. Takes 8 tablets(20mg ) every Saturday.     Caution:Chemotherapy. Protect from light.     metoCLOPramide (REGLAN) 10 MG tablet Take 1 tablet (10 mg total) by mouth every 6 (six) hours. (Patient taking differently: Take 10 mg by mouth as needed.) 30 tablet 0  Multiple Vitamins-Minerals (PRESERVISION AREDS 2 PO) Take 2 capsules by mouth daily.     oxyCODONE (OXY IR/ROXICODONE) 5 MG immediate release tablet Take 1 tablet (5 mg total) by mouth 2 (two) times daily as needed for breakthrough pain. 60 tablet 0   Polyethyl Glycol-Propyl Glycol (SYSTANE OP) Apply to eye. 3 - 4 drops 4 times per day     predniSONE (DELTASONE) 5 MG tablet Take 5 mg by mouth daily with breakfast. 5 days dose pack     RITUXAN 500 MG/50ML injection See admin instructions. Every 6 months 2 dose infusion Ruxience     rosuvastatin (CRESTOR) 20 MG tablet Take 1 tablet (20 mg total) by mouth daily. 90 tablet 3   scopolamine (TRANSDERM-SCOP) 1 MG/3DAYS Place 1 patch onto the skin every 3 (three) days.     sertraline (ZOLOFT) 25 MG tablet Take 3 tablets (75 mg total) by mouth every morning. 90 tablet 0   sulfaSALAzine (AZULFIDINE) 500 MG tablet Take 1,000 mg by mouth 2 (two) times daily.      traZODone (DESYREL) 50 MG tablet Take 1 tablet (50 mg total) by mouth at bedtime as needed for sleep. 30 tablet 0   White Petrolatum-Mineral Oil (GENTEAL TEARS NIGHT-TIME OP) Apply to eye at bedtime.     pregabalin (LYRICA) 200 MG capsule Take 1 capsule  (200 mg total) by mouth 2 (two) times daily. (Patient not taking: Reported on 09/08/2022) 60 capsule 0   No current facility-administered medications for this visit.    Allergies:   Patient has no known allergies.    Social History:  The patient  reports that he quit smoking about 14 years ago. His smoking use included cigarettes. He has a 10.00 pack-year smoking history. He has never used smokeless tobacco. He reports current alcohol use. He reports that he does not use drugs.   Family History:  The patient's family history includes Lupus in his sister.    ROS:  Please see the history of present illness.   Otherwise, review of systems are positive for HR increasing easily.   All other systems are reviewed and negative.    PHYSICAL EXAM: VS:  BP 126/84   Pulse (!) 106   Ht 6\' 3"  (1.905 m)   Wt 185 lb 9.6 oz (84.2 kg)   SpO2 95%   BMI 23.20 kg/m  , BMI Body mass index is 23.2 kg/m. GEN: Well nourished, well developed, in no acute distress HEENT: normal Neck: no JVD, carotid bruits, or masses Cardiac: RRR; no murmurs, rubs, or gallops,no edema  Respiratory:  clear to auscultation bilaterally, normal work of breathing GI: soft, nontender, nondistended, + BS MS: no deformity or atrophy Skin: warm and dry, no rash Neuro:  Strength and sensation are intact; poor balance; walks with a cane Psych: euthymic mood, full affect   EKG:   The ekg ordered today demonstrates NSR, nonspecific ST-T wave changes,  QT interval has shortened   Recent Labs: 07/30/2022: B Natriuretic Peptide 22.5 08/27/2022: ALT 27; BUN 14; Creatinine, Ser 0.78; Hemoglobin 10.5; Magnesium 1.9; Platelets 192; Potassium 3.6; Sodium 141   Lipid Panel    Component Value Date/Time   CHOL 118 07/29/2022 0200   TRIG 97 07/29/2022 0200   HDL 42 07/29/2022 0200   CHOLHDL 2.8 07/29/2022 0200   VLDL 19 07/29/2022 0200   LDLCALC 57 07/29/2022 0200     Other studies Reviewed: Additional studies/ records that were  reviewed today with results demonstrating: Hospital records reviewed.  ASSESSMENT AND PLAN:  Chronic systolic heart failure/nonischemic cardiomyopathy: EF 40% in 2019 per Dr. Donnie Aho.  Last echo showed EF 40 to 45%.  He has been through several surgeries and a stroke in the last 6 months.  He is more deconditioned than before.  I suspect his heart failure increasing with minimal activity is due to deconditioning.  Can add back Toprol-XL 25 mg daily.  They have some at home already.  This should help keep the heart rate down and hopefully will help to continue to improve his LV function.  ACE inhibitor was stopped in the past due to low blood pressure.  Would hold off on adding this back at the same time.  If his blood pressure were to be high, ACE inhibitor would be the first choice. Hyperlipidemia: Has been on rosuvastatin.  Whole food, plant-based diet.  High-fiber diet.  Avoid processed foods. Coronary artery calcification: No angina.  Continue aggressive secondary prevention.  LDL 57 in March 2024.  Well-controlled. Prolonged QT interval: Noted when he was taking Zofran.  Zofran has been stopped.  QT interval has decreased from 523 ms to 482 ms.   Current medicines are reviewed at length with the patient today.  The patient concerns regarding his medicines were addressed.  The following changes have been made:  No change  Labs/ tests ordered today include:  No orders of the defined types were placed in this encounter.   Recommend 150 minutes/week of aerobic exercise Low fat, low carb, high fiber diet recommended  Disposition:   FU in 1 year   Signed, Lance Muss, MD  09/18/2022 1:50 PM    [facility] [address] [place], [place], [place]  [address] Phone: [phone]; Fax: [phone]

## 2022-09-18 NOTE — Therapy (Signed)
OUTPATIENT PHYSICAL THERAPY NEURO TREATMENT   Patient Name: Christopher Yu MRN: [id number] DOB:Sep 02, 1960, 62 y.o., male Today's Date: 09/18/2022   PCP: Thana Ates, MD REFERRING PROVIDER: Charlton Amor, PA-C  END OF SESSION:  PT End of Session - 09/18/22 0856     Visit Number 7    Number of Visits 17    Date for PT Re-Evaluation 11/07/22    Authorization Type [employer]    PT Start Time 0850    PT Stop Time 0928    PT Time Calculation (min) 38 min    Equipment Utilized During Treatment Gait belt    Activity Tolerance Patient tolerated treatment well;Patient limited by pain   RA flare in bilateral shoulders   Behavior During Therapy [facility] for tasks assessed/performed;Flat affect             Past Medical History:  Diagnosis Date   Anxiety    Arthritis, rheumatoid (HCC)    dx 1988   CAD (coronary artery disease), native coronary artery    Mild LAD disease with calcification noted at cath 12//27/16    Cardiomyopathy (HCC)    Cardiomyopathy (HCC)    Chronic systolic heart failure (HCC) 05/01/2015   Depression    Deviated nasal septum 06/25/2011   Headache    High cholesterol    Hyperlipidemia    Impingement syndrome of left shoulder 12/22/2017   Macular degeneration    Nausea and vomiting 08/28/2022   Rheumatoid aortitis    Sleep apnea    does not use cpap - UNABLE TO TOLERATE MASK   Sleep apnea in adult    deviated septum repaired, most recent sleep study was negative   Status post total right knee replacement 06/01/2015   Past Surgical History:  Procedure Laterality Date   ANKLE FUSION Right 05/05/2012   related to his arthritis   ANKLE FUSION Left 05/25/2013   related to his arthritis   ANKLE SURGERY Bilateral    APPLICATION OF CRANIAL NAVIGATION N/A 04/11/2022   Procedure: APPLICATION OF CRANIAL NAVIGATION;  Surgeon: Lisbeth Renshaw, MD;  Location: [facility] OR;  Service: Neurosurgery;  Laterality: N/A;   APPLICATION OF CRANIAL NAVIGATION N/A  08/08/2022   Procedure: APPLICATION OF CRANIAL NAVIGATION;  Surgeon: Lisbeth Renshaw, MD;  Location: [facility] OR;  Service: Neurosurgery;  Laterality: N/A;   CARDIAC CATHETERIZATION N/A 05/01/2015   Procedure: Left Heart Cath and Coronary Angiography;  Surgeon: Lyn Records, MD;  Location: [facility] INVASIVE CV LAB;  Service: Cardiovascular;  Laterality: N/A;   CRANIOTOMY N/A 04/11/2022   Procedure: STEREOTACTIC SUBOCCIPITAL CRANIOTOMY FOR RESECTION OF ARTERIO-VENOUS MALFORMATION;  Surgeon: Lisbeth Renshaw, MD;  Location: [facility] OR;  Service: Neurosurgery;  Laterality: N/A;   FRACTURE SURGERY     left femur fracture x 3   IR ANGIO EXTERNAL CAROTID SEL EXT CAROTID BILAT MOD SED  01/28/2022   IR ANGIO INTRA EXTRACRAN SEL INTERNAL CAROTID BILAT MOD SED  01/28/2022   IR ANGIO INTRA EXTRACRAN SEL INTERNAL CAROTID BILAT MOD SED  04/16/2022   IR ANGIO VERTEBRAL SEL VERTEBRAL BILAT MOD SED  01/28/2022   IR ANGIO VERTEBRAL SEL VERTEBRAL BILAT MOD SED  04/16/2022   KNEE ARTHROSCOPY     right x 4   LAPAROSCOPIC REVISION VENTRICULAR-PERITONEAL (V-P) SHUNT N/A 08/08/2022   Procedure: LAPAROSCOPIC VENTRICULAR-PERITONEAL (V-P) SHUNT;  Surgeon: Harriette Bouillon, MD;  Location: [facility] OR;  Service: General;  Laterality: N/A;   LUMBAR LAMINECTOMY/DECOMPRESSION MICRODISCECTOMY N/A 07/18/2022   Procedure: Repair of Pseudomeningiocele posterior;  Surgeon: Lisbeth Renshaw,  MD;  Location: [facility] OR;  Service: Neurosurgery;  Laterality: N/A;   NASAL SEPTOPLASTY W/ TURBINOPLASTY  06/25/2011   Procedure: NASAL SEPTOPLASTY WITH TURBINATE REDUCTION;  Surgeon: Osborn Coho, MD;  Location: [facility] OR;  Service: ENT;  Laterality: Bilateral;   PLACEMENT OF LUMBAR DRAIN N/A 07/18/2022   Procedure: PLACEMENT OF LUMBAR DRAIN;  Surgeon: Lisbeth Renshaw, MD;  Location: [facility] OR;  Service: Neurosurgery;  Laterality: N/A;   TONSILLECTOMY     TOTAL KNEE ARTHROPLASTY Right 06/01/2015   Procedure: RIGHT TOTAL KNEE ARTHROPLASTY;  Surgeon: Kathryne Hitch, MD;  Location: [facility] ORS;  Service: Orthopedics;  Laterality: Right;  Block+general   VENTRICULOPERITONEAL SHUNT Right 08/08/2022   Procedure: LAP ASSITED SHUNT INSERTION VENTRICULAR-PERITONEAL;  Surgeon: Lisbeth Renshaw, MD;  Location: [facility] OR;  Service: Neurosurgery;  Laterality: Right;   Patient Active Problem List   Diagnosis Date Noted   Nausea and vomiting 08/28/2022   Adjustment disorder 08/06/2022   Protein-calorie malnutrition, severe 07/31/2022   Cerebellar stroke (HCC) 07/29/2022   Ischemic cerebrovascular accident (CVA) (HCC) 07/28/2022   Prolonged QT interval 07/28/2022   Pseudomeningocele 07/18/2022   Pseudomeningocele due to surgical procedure 07/18/2022   Status post craniotomy 04/11/2022   AVM (arteriovenous malformation) brain 04/11/2022   Impingement syndrome of left shoulder 12/22/2017   Impingement syndrome of right shoulder 12/22/2017   Rheumatoid arthritis involving right knee (HCC) 06/01/2015   Status post total right knee replacement 06/01/2015   Cardiomyopathy (HCC) 05/02/2015   Hyperlipidemia    Sleep apnea in adult    Rheumatoid arthritis (HCC)    Chronic systolic heart failure (HCC) 05/01/2015   CAD (coronary artery disease), native coronary artery     ONSET DATE: 08/21/2022 (referral date)  REFERRING DIAG: I63.9 (ICD-10-CM) - Cerebral infarction, unspecified  THERAPY DIAG:  Other lack of coordination  Unsteadiness on feet  Muscle weakness (generalized)  Abnormality of gait and mobility  Rationale for Evaluation and Treatment: Rehabilitation  SUBJECTIVE:                                                                                                                                                                                             SUBJECTIVE STATEMENT: Patient arrives with wife, ambulating with SPC.  He is not wearing motion sensitivity patch this morning, but states he feels fine.  He has not been back out onto his deck due to  weather and concern for one piece of board that is not in great shape.  Denies falls/near falls.  Pt accompanied by: self and significant other - wife Christopher Yu  PERTINENT HISTORY: cerebellar AVM resection 12/23, pseudomeningocele s/p craniotomy 3/24,  acute R cerebellar infarct, hydrocephalus and VP shunt was placed on 08/08/22, dysphagia, dysarthria, aphasia  PMH: rheumatoid arthritis, bilateral ankle fusion (2014 and 2015), cardiomyopathy, CAD, R TKA, cardiomyopathy, chronic systolic heart failure, macular degeneration, OSA on CPAP  PAIN:  Are you having pain? No-shoulders slowly improving w/ Prednisone taper per pt and wife reports.  PRECAUTIONS: Fall and Other: swallow precautions with modification with left head tilt and supervision if drinking thinned liquids, per caregiver not to lift, history of orthostatic hypotension resulting in falls  PATIENT GOALS: "To get back to playing golf and moving like I used to."  OBJECTIVE:   DIAGNOSTIC FINDINGS:   CT on 08/27/2022: IMPRESSION: 1. No evidence of acute infarct or hemorrhage. 2. Stable right-sided ventriculostomy catheter, tip within the right lateral ventricle. Stable size of the lateral ventricles without hydrocephalus. 3. Postsurgical changes from prior suboccipital craniectomy, with underlying encephalomalacia in the cerebellum. The pseudomeningocele seen on prior study has increased slightly in size since prior exam, of uncertain significance. 4. Decreased size of the right superior cerebellar intraparenchymal cyst, with decreased mass effect upon the fourth ventricle.  MR Brain on 07/28/2022: IMPRESSION: 1. New small focus of acute infarction in the right superior cerebellar hemisphere with surrounding area of increased T2 signal and cystic change, which is new since the prior MRI dated 07/05/2022 and suspicious for now chronic infarct. 2. Postoperative changes of prior left posterior fossa resection. Redemonstrated  foci of gas within the resection cavity, fourth ventricle and extra-axial spaces, likely secondary to recent pseudomeningocele repair and/or lumbar drain placement/removal. 3. Decreased size of the fluid collection in the dorsal soft tissues overlying the suboccipital craniectomy.  TODAY'S TREATMENT:                                                                                                                              -Corner balance feet together eyes open x30 second unsupported > feet together eyes closed 3x30 seconds using fingertip support on last 2 reps to prevent posterior LOB as pt has significant posterior lean -Standing tandem at counter eyes open x30 sec > eyes closed x30 sec BUE support SBA alt LE in rear for each condition -Quarter turns at counter x5 each direction > half turns w/o counter support x5 each direction > full turns x2 each direction SBA unsupported; pt able to progress to unsupported movement for each task w/ education and cuing to use saccadic visual tracking for fixation to help suppress motion sensitivity, pt reports no increased symptoms during task -Standing wide stance on firm surface picking up and placing cones in semi-circle on floor -Standing wide stance on airex eyes open SBA mild sway > wide stance on airex eyes open head turns 2x30 seconds w/ intermittent CGA due to moderate sway.  Unable to progress to added trunk and shoulder mobility due to reported pain ongoing from RA.  PATIENT EDUCATION: Education details:  Re-iterated 5/16:  Encouraged compliance to HEP.  Use RW  for long and unlevel surfaces for safest mobility.  Discussed pt using SPC in home w/ distant supervision for tasks like going to the restroom to progress independence slowly.  Fall prevention edu on removing throw rugs, unblocking walkways with furniture and using good lighting.  Slow transitions and using RW when getting up in the middle of the night or just after sleeping to prevent a  fall. Person educated: Patient and Spouse Education method: Medical illustrator Education comprehension: verbalized understanding and needs further education  HOME EXERCISE PROGRAM: You Can Walk For A Certain Length Of Time Each Day                          Walk 2 minutes 1 times per day.             Increase 2  minutes every 3 days              Work up to 30 minutes (1-2 times per day).               Example:                         Day 1-2           4-5 minutes     3 times per day                         Day 7-8           10-12 minutes 2-3 times per day                         Day 13-14       20-22 minutes 1-2 times per day  Oculomotor: Saccades    Holding two targets positioned side by side __6__ inches apart, move eyes quickly from target to target as head stays still.  Perform sitting. Repeat __10__ times per session. Do __3__ sessions per day.  Access Code: [email or web address] URL: [email or web address] Date: 09/12/2022 Prepared by: Camille Bal  Exercises - Pencil Pushups  - 1 x daily - 5 x weekly - 3 sets - 5 reps - Corner Balance Feet Together With Eyes Closed  - 1 x daily - 5 x weekly - 1 sets - 3-4 reps - 30-45 seconds hold - Standing Tandem Balance with Counter Support  - 1 x daily - 5 x weekly - 1 sets - 2-3 reps - 45 seconds hold  GOALS: Goals reviewed with patient? Yes  SHORT TERM GOALS: Target date: 09/26/2022  Patient will demonstrate 100% compliance with initial HEP to continue to progress between physical therapy sessions.   Baseline: To be provided Goal status: INITIAL  2.  Patient will improve their 5x Sit to Stand score to less than 18 seconds to demonstrate a decreased risk for falls and improved LE strength.   Baseline: 22.45" without UE support/unsteady WBOS Goal status: INITIAL  3.  Patient will improve TUG score to 17 seconds or less with LRAD to indicate a decreased risk of falls and demonstrate improved overall mobility.   Baseline: 21.27" with RW Goal status: INITIAL  4.  Patient will improve gait speed to 0.87 m/s to indicate improvement to the level of community ambulator in order to participate more easily in activities outside of the home such as adapted golf.   Baseline: 0.77 m/s Goal status: INITIAL  5. Patient will improve BBS score to >/= 38/56 to demonstrate improved balance Baseline: 29/56 Goal status: INITIAL  LONG TERM GOALS: Target date: 10/24/2022  Patient will report demonstrate independence with final HEP in order to maintain current gains and continue to progress after physical therapy discharge.   Baseline: To be provided Goal status: INITIAL  2.   Patient will improve their 5x Sit to Stand score to 15 seconds to demonstrate a decreased risk for falls and improved LE strength.   Baseline: 22.45" without UE support/unsteady WBOS Goal status: INITIAL  3.  Patient will improve TUG score to 15 seconds or less with LRAD to indicate a decreased risk of falls and demonstrate improved overall mobility.  Baseline: 21.27" with RW Goal status: INITIAL  4.  Patient will improve gait speed to 1.0 m/s to indicate improvement to the level of community ambulator in order to participate more easily in activities outside of the home such as adapted golf.   Baseline: 0.77 m/s Goal status: INITIAL  5.  Patient will improve FOTO score to 57 to achieve predicted improvements in functional mobility due to skilled physical therapy interventions to increase safety with and participation in daily activities. Baseline: 49 Goal status: INITIAL  6.  Patient will improve BBS score to >/= 47/56 to demonstrate improved balance Baseline: 29/56 Goal status: INITIAL  ASSESSMENT:  CLINICAL IMPRESSION: Added static balance activities to patient HEP today to progress balance strategies particularly of the ankle.  Pt still maintains rigid posture during dynamic mobility, but progression during activities remains  conservative so as to not worsen bilateral shoulder pain.  Will plan to progress to dynamic trunk and shoulder mobility to progress balance strategies and improve general mobility in coming sessions as able.  Pt is overall tolerating interventions well and demonstrates improved motion sensitivity this visit during turning and forward bending tasks.  Will progress with POC.  OBJECTIVE IMPAIRMENTS: Abnormal gait, cardiopulmonary status limiting activity, decreased activity tolerance, decreased balance, decreased cognition, decreased coordination, decreased endurance, decreased knowledge of condition, decreased mobility, difficulty walking, decreased ROM, decreased strength, and impaired tone.   ACTIVITY LIMITATIONS: carrying, lifting, bending, sitting, standing, squatting, stairs, transfers, reach over head, locomotion level, and caring for others  PARTICIPATION LIMITATIONS: cleaning, driving, shopping, community activity, occupation, and yard work, Systems analyst  PERSONAL FACTORS: Past/current experiences and 3+ comorbidities: see above  are also affecting patient's functional outcome, reliance on others for transportation   REHAB POTENTIAL: Good - may be limited by complex medical history  CLINICAL DECISION MAKING: Evolving/moderate complexity  EVALUATION COMPLEXITY: Moderate  PLAN:  PT FREQUENCY: 2x/week  PT DURATION: 8 weeks  PLANNED INTERVENTIONS: Therapeutic exercises, Therapeutic activity, Neuromuscular re-education, Balance training, Gait training, Patient/Family education, Self Care, Joint mobilization, Stair training, Vestibular training, DME instructions, Wheelchair mobility training, Manual therapy, and Re-evaluation  PLAN FOR NEXT SESSION: monitor vitals/orthostatic hypotension, add to HEP for balance prn, high intensity gait training, integrate golfing concepts as able, add in simple dual task to gait with SPC, vision exercises, dynamic balance  Sadie Haber, PT, DPT 09/18/2022,  9:33 AM

## 2022-09-19 ENCOUNTER — Ambulatory Visit: Payer: BC Managed Care – PPO

## 2022-09-19 DIAGNOSIS — R2681 Unsteadiness on feet: Secondary | ICD-10-CM | POA: Diagnosis not present

## 2022-09-19 DIAGNOSIS — R278 Other lack of coordination: Secondary | ICD-10-CM | POA: Diagnosis not present

## 2022-09-19 DIAGNOSIS — R42 Dizziness and giddiness: Secondary | ICD-10-CM | POA: Diagnosis not present

## 2022-09-19 DIAGNOSIS — R41842 Visuospatial deficit: Secondary | ICD-10-CM

## 2022-09-19 DIAGNOSIS — R41840 Attention and concentration deficit: Secondary | ICD-10-CM | POA: Diagnosis not present

## 2022-09-19 DIAGNOSIS — R471 Dysarthria and anarthria: Secondary | ICD-10-CM | POA: Diagnosis not present

## 2022-09-19 DIAGNOSIS — R269 Unspecified abnormalities of gait and mobility: Secondary | ICD-10-CM | POA: Diagnosis not present

## 2022-09-19 DIAGNOSIS — M6281 Muscle weakness (generalized): Secondary | ICD-10-CM | POA: Diagnosis not present

## 2022-09-19 DIAGNOSIS — R1310 Dysphagia, unspecified: Secondary | ICD-10-CM | POA: Diagnosis not present

## 2022-09-19 DIAGNOSIS — R262 Difficulty in walking, not elsewhere classified: Secondary | ICD-10-CM | POA: Diagnosis not present

## 2022-09-19 NOTE — Therapy (Signed)
OUTPATIENT OCCUPATIONAL THERAPY NEURO TREATMENT  Patient Name: Christopher Yu MRN: [id number] DOB:Mar 12, 1961, 62 y.o., male Today's Date: 09/19/2022  PCP: Thana Ates, MD REFERRING PROVIDER: Charlton Amor, PA-C  END OF SESSION:  OT End of Session - 09/19/22 0757     Visit Number 6    Number of Visits 20    Date for OT Re-Evaluation 06/20/22    Authorization Type [employer]    OT Start Time 0758    OT Stop Time 0839    OT Time Calculation (min) 41 min    Equipment Utilized During Treatment Gait Belt    Activity Tolerance Patient tolerated treatment well;Patient limited by fatigue    Behavior During Therapy [facility] for tasks assessed/performed;Flat affect             Past Medical History:  Diagnosis Date   Anxiety    Arthritis, rheumatoid (HCC)    dx 1988   CAD (coronary artery disease), native coronary artery    Mild LAD disease with calcification noted at cath 12//27/16    Cardiomyopathy (HCC)    Cardiomyopathy (HCC)    Chronic systolic heart failure (HCC) 05/01/2015   Depression    Deviated nasal septum 06/25/2011   Headache    High cholesterol    Hyperlipidemia    Impingement syndrome of left shoulder 12/22/2017   Macular degeneration    Nausea and vomiting 08/28/2022   Rheumatoid aortitis    Sleep apnea    does not use cpap - UNABLE TO TOLERATE MASK   Sleep apnea in adult    deviated septum repaired, most recent sleep study was negative   Status post total right knee replacement 06/01/2015   Past Surgical History:  Procedure Laterality Date   ANKLE FUSION Right 05/05/2012   related to his arthritis   ANKLE FUSION Left 05/25/2013   related to his arthritis   ANKLE SURGERY Bilateral    APPLICATION OF CRANIAL NAVIGATION N/A 04/11/2022   Procedure: APPLICATION OF CRANIAL NAVIGATION;  Surgeon: Lisbeth Renshaw, MD;  Location: [facility] OR;  Service: Neurosurgery;  Laterality: N/A;   APPLICATION OF CRANIAL NAVIGATION N/A 08/08/2022   Procedure:  APPLICATION OF CRANIAL NAVIGATION;  Surgeon: Lisbeth Renshaw, MD;  Location: [facility] OR;  Service: Neurosurgery;  Laterality: N/A;   CARDIAC CATHETERIZATION N/A 05/01/2015   Procedure: Left Heart Cath and Coronary Angiography;  Surgeon: Lyn Records, MD;  Location: [facility] INVASIVE CV LAB;  Service: Cardiovascular;  Laterality: N/A;   CRANIOTOMY N/A 04/11/2022   Procedure: STEREOTACTIC SUBOCCIPITAL CRANIOTOMY FOR RESECTION OF ARTERIO-VENOUS MALFORMATION;  Surgeon: Lisbeth Renshaw, MD;  Location: [facility] OR;  Service: Neurosurgery;  Laterality: N/A;   FRACTURE SURGERY     left femur fracture x 3   IR ANGIO EXTERNAL CAROTID SEL EXT CAROTID BILAT MOD SED  01/28/2022   IR ANGIO INTRA EXTRACRAN SEL INTERNAL CAROTID BILAT MOD SED  01/28/2022   IR ANGIO INTRA EXTRACRAN SEL INTERNAL CAROTID BILAT MOD SED  04/16/2022   IR ANGIO VERTEBRAL SEL VERTEBRAL BILAT MOD SED  01/28/2022   IR ANGIO VERTEBRAL SEL VERTEBRAL BILAT MOD SED  04/16/2022   KNEE ARTHROSCOPY     right x 4   LAPAROSCOPIC REVISION VENTRICULAR-PERITONEAL (V-P) SHUNT N/A 08/08/2022   Procedure: LAPAROSCOPIC VENTRICULAR-PERITONEAL (V-P) SHUNT;  Surgeon: Harriette Bouillon, MD;  Location: [facility] OR;  Service: General;  Laterality: N/A;   LUMBAR LAMINECTOMY/DECOMPRESSION MICRODISCECTOMY N/A 07/18/2022   Procedure: Repair of Pseudomeningiocele posterior;  Surgeon: Lisbeth Renshaw, MD;  Location: [facility] OR;  Service: Neurosurgery;  Laterality: N/A;   NASAL SEPTOPLASTY W/ TURBINOPLASTY  06/25/2011   Procedure: NASAL SEPTOPLASTY WITH TURBINATE REDUCTION;  Surgeon: Osborn Coho, MD;  Location: [facility] OR;  Service: ENT;  Laterality: Bilateral;   PLACEMENT OF LUMBAR DRAIN N/A 07/18/2022   Procedure: PLACEMENT OF LUMBAR DRAIN;  Surgeon: Lisbeth Renshaw, MD;  Location: [facility] OR;  Service: Neurosurgery;  Laterality: N/A;   TONSILLECTOMY     TOTAL KNEE ARTHROPLASTY Right 06/01/2015   Procedure: RIGHT TOTAL KNEE ARTHROPLASTY;  Surgeon: Kathryne Hitch, MD;  Location: [facility]  ORS;  Service: Orthopedics;  Laterality: Right;  Block+general   VENTRICULOPERITONEAL SHUNT Right 08/08/2022   Procedure: LAP ASSITED SHUNT INSERTION VENTRICULAR-PERITONEAL;  Surgeon: Lisbeth Renshaw, MD;  Location: [facility] OR;  Service: Neurosurgery;  Laterality: Right;   Patient Active Problem List   Diagnosis Date Noted   Nausea and vomiting 08/28/2022   Adjustment disorder 08/06/2022   Protein-calorie malnutrition, severe 07/31/2022   Cerebellar stroke (HCC) 07/29/2022   Ischemic cerebrovascular accident (CVA) (HCC) 07/28/2022   Prolonged QT interval 07/28/2022   Pseudomeningocele 07/18/2022   Pseudomeningocele due to surgical procedure 07/18/2022   Status post craniotomy 04/11/2022   AVM (arteriovenous malformation) brain 04/11/2022   Impingement syndrome of left shoulder 12/22/2017   Impingement syndrome of right shoulder 12/22/2017   Rheumatoid arthritis involving right knee (HCC) 06/01/2015   Status post total right knee replacement 06/01/2015   Cardiomyopathy (HCC) 05/02/2015   Hyperlipidemia    Sleep apnea in adult    Rheumatoid arthritis (HCC)    Chronic systolic heart failure (HCC) 05/01/2015   CAD (coronary artery disease), native coronary artery     ONSET DATE: 08/21/2022 (date of referral)  REFERRING DIAG: I63.9 (ICD-10-CM) - Cerebral infarction, unspecified  THERAPY DIAG:  Muscle weakness (generalized)  Other lack of coordination  Unsteadiness on feet  Visuospatial deficit  Attention and concentration deficit  Rationale for Evaluation and Treatment: Rehabilitation  SUBJECTIVE:   SUBJECTIVE STATEMENT: Pt reports feeling well overall this morning. Wife reports not switching medication yet from cardiologist recommendation knowing pt had therapy this morning, concern it would impact his therapy session negatively.  Pt accompanied by: self and significant other, [name] and daughter [name]  PERTINENT HISTORY: CT/MRI showed new small focus of acute infarction in  the right superior cerebellar hemisphere. Cerebellar AVM resection 12/23, pseudomeningocele s/p craniotomy 3/24, acute R cerebellar infarct, hydrocephalus and VP shunt was placed on 08/08/22, dysphagia, dysarthria, aphasia. PMH: rheumatoid arthritis, bilateral ankle fusion (2014 and 2015), cardiomyopathy, CAD, R TKA, cardiomyopathy, chronic systolic heart failure, macular degeneration, OSA on CPAP  PRECAUTIONS: Fall and Other: swallow precautions with modification with left head tilt and supervision if drinking thinned liquids, per caregiver not to lift, history of orthostatic hypotension resulting in falls; no driving  WEIGHT BEARING RESTRICTIONS: No  PAIN:  Are you having pain? Rt shoulder 4/10 from RA flare up, constant (premorbid) while at rest  FALLS: Has patient fallen in last 6 months? Yes. Number of falls 3 - after the first surgery, got dizzy and fell, using the walker, when up and moving, struggled with orthostatic hypotension but medical team has since decreased BP medications  LIVING ENVIRONMENT: Lives with: lives with their spouse, two teenage daughters (also has dtr who lives in [place]), and 175 lb dog Lives in: House/apartment - Able to live on main floor Stairs: Yes: Internal: ~14 steps; on right going up and External: 3 steps; on right going up Has following equipment at home: Dan Humphreys - 2 wheeled and Wheelchair (manual) [employer]  setup: Walk-in shower, regular height toilet  PLOF: Prior to December, pt completely independent with ADL/IADLs, ambulatory without device, pt enjoys golfing, driving, 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  PATIENT GOALS: Return to golf, improve vision for work, travel in June for daughter's outdoor wedding (be off the walker ideally)  OBJECTIVE: (results from evaluation unless otherwise stated)  HAND DOMINANCE: Right  ADLs: Eating: Independent with self-feeding, wife cuts items due to impaired coordination per her report Grooming: Independent mostly,  but reports it's painful to brush hair from surgical procedure in December UB Dressing: Supervision, increased time and wife present if needed LB Dressing: Supervision increased time and wife present if needed. Sometimes sits to don pants Toileting: Supervision, more effort, wife there in case pt loses balance Bathing: Supervision for balance Tub Shower transfers: Supervision likely; however, sponge bath currently due to hand held shower head breaking and plumber coming 5/1.  Equipment:  Toilet riser with armrests , shower chair, bedrail on bed  IADLs: Shopping: Currently unable, previously independent Light housekeeping: Currently unable, previously did laundry Meal Prep: Currently unable, but previously assisted in task Community mobility: and household, pt utilizing rolling walker at all times. Typically sedentary in recliner most of the day. Gets up for bathroom.  Medication management: Wife Presenter, broadcasting: Currently unable, pt typically did before Handwriting: 75% legible and Mild micrographia   MOBILITY STATUS:  Pt ambulatory with RW, wife providing supervision  at all times  POSTURE COMMENTS:  rounded shoulders Sitting balance: Moves/returns truncal midpoint 1-2 inches in multiple planes - Wife describes she believes it is more of a coordination issue, overshooting and then losing balance.   ACTIVITY TOLERANCE: Activity tolerance: Subjective report of significant fatigue  FUNCTIONAL OUTCOME MEASURES: FOTO: 58 score  UPPER EXTREMITY ROM:    Active ROM Right eval Left eval  Shoulder flexion Tops [facility]  Shoulder abduction [facility]  Shoulder adduction [facility] WFL  Shoulder extension [facility]  Shoulder internal rotation    Shoulder external rotation    Elbow flexion WFL WFL  Elbow extension [facility] WFL  Wrist flexion    Wrist extension    Wrist ulnar deviation    Wrist radial deviation    Wrist pronation    Wrist supination    (Blank rows = not tested)  UPPER  EXTREMITY MMT:     MMT Right eval Left eval  Shoulder flexion 4+/5 4+/5  Shoulder abduction    Shoulder adduction    Shoulder extension    Shoulder internal rotation    Shoulder external rotation    Middle trapezius    Lower trapezius    Elbow flexion 4+/5 4+/5  Elbow extension 4+/5 4+/5  Wrist flexion    Wrist extension    Wrist ulnar deviation    Wrist radial deviation    Wrist pronation    Wrist supination    (Blank rows = not tested)  HAND FUNCTION: Grip strength: Right: 67.2 lbs; Left: 66.3 lbs and 3 point pinch: Right: 12 lbs, Left: 15 lbs  COORDINATION: 9 Hole Peg test: Right: 31 sec; Left: 59 sec Box and Blocks:  Right 27 blocks, Left 19blocks  SENSATION: WFL  EDEMA: None  MUSCLE TONE: WFL  COGNITION: Overall cognitive status:  Grossly WFL, possible for attention deficits.  VISION: Subjective report: Pt reports some jerky vision since December Baseline vision: Wears glasses all the time Visual history: cataracts and macular degeneration  VISION ASSESSMENT: Impaired Eye alignment: Impaired: Right eye seemed to have amblyopia-type appearance with  visual pursuits  Reading acuity: Pt reports difficulty reading instructions at this time, he reports all letters merge together Tracking/Visual pursuits: Right eye appears to be slower than left Saccades: overshoots - right eye from temporal to nasal Convergence: WFL Visual Fields: Grossly intact; however, decreased peripheral vision to right side when provided with moving target with detection of object at 30* Diplopia assessment: When given macular degeneration Amsler grid, pt reports double vision of right eye when left eye occluded. Quadrants - WFL   Patient has difficulty with following activities due to following visual impairments: Work related tasks  PERCEPTION: WFL  PRAXIS: WFL  OBSERVATIONS: Patient arrives to session ambulating with rolling walker. Patient wife primarily speaks during the  session given patient's dysarthria. Per caregiver, patient had a surgery to address AVM and then had pseudomengiocele and underwent 3 surgeries as a result of complications. Per report, patient was D/C from hospital and then returned with symptoms of acute stroke. Prior to December patient was fully independent, working full time in IT, and golfing regularly every weekend; however, patient is now fully supervised for all ADLs, not working, and now mobilizing either via manual W/C or walker. Patient reports increased nausea disrupting appetite. Family reports going to the ED on last Wednesday due to new reports of nausea and double vision and there was concern for another stroke; however, family decided to the return home when CT came back clear and did not want to go through additional MRI. Per family, inpatient rehab physician aware of this and recommended they call their neurosurgeon for follow up as well and did not advise against continuing outpatient therapy. Family also expresses concern in not knowing how to truly recognize a stroke as patient now has such extensive deficits. Family also reports that a therapist in inpatient rehab noticed unique eye movements and recommend they also see a therapist that could help address vestibular concerns. Wife showing OT video today. Video showed pt performing visual pursuits with jerky eye movement going from left to right sides, right eye impacted.  09/09/22: Pt received from PT lying on mat table. Pt stating taking a rest break. Pt denies pain or dizziness.    TODAY'S TREATMENT:                                                                                                                              DATE:  Therapeutic exercise - Pt engaging in the following work out to address endurance, balance, proprioception, and strength all required for daily functional activity: Weighted carry with cane in right hand and 10Lb weight left hand x 50 ft, KB goblet squat with  chair behind 15lb x 5 reps, slam ball with 6lb weight with mat table behind x 3 reps. Pt repeating x 3 sets total with seated rest as needed. HR monitored after each set as follows: 1 set HR 121, 2nd set HR 116, and 3rd set HR 125. Pt reporting RPE 5/10 for  the activity.  PATIENT EDUCATION: Education details: Monitoring HR, RPE scale education Education provided to: Patient, spouse, and daughter Delivered by: Verbal cues Pt receptibe   HOME EXERCISE PROGRAM: 09/02/22: Coordination HEP 09/04/22: Modified coordination HEP (for vision) and vision HEP 09/18/22: No hand out provided but encouraged to work on standing activities throughout the day and combine with table top ball rolling for shoulder ROM.  GOALS: Goals reviewed with patient? Yes  SHORT TERM GOALS: Target date: 09/30/2022   Pt will be independent in LUE HEP. Baseline: Initiated Goal status: IN PROGRESS  2.  Pt will demonstrate improved fine motor coordination for ADLs as evidenced by decreasing 9 hole peg test score for left hand by >/=5 secs  Baseline: Lt - 59 secs Goal status: IN PROGRESS  3.  Pt will be able to place at least 3 blocks using left hand with completion of Box and Blocks test.  Baseline: Lt- 19 blocks Goal status: IN PROGRESS  4.  Pt will tolerate >/=10 minutes of standing activity to simulate activity tolerance needed for ADL/IADL management. Baseline: Subjective description of sitting down for most tasks Goal status: IN PROGRESS  5.  Pt will write a sentence with no significant decrease in size and maintain 100% legibility.  Baseline: 75% legibility and mild micrographia Goal status: INITIAL  6.  Pt will improve visual closure skills to recognize and complete visual patterns or missing parts of visual stimuli, enhancing visual perception and problem-solving abilities. Baseline: Overshooting eye movements, impaired peripheral vision, merging of letters per subjective report Goal status: IN  PROGRESS  LONG TERM GOALS: Target date: 11/11/2022   Pt will complete FOTO assessment at time of discharge scoring 68 or greater indicating functional progression with ADL and IADL completion. Baseline: 58  Goal status: INITIAL  2.  Pt will demonstrate improved fine motor coordination for ADLs as evidenced by decreasing 9 hole peg test score for left hand by >/=10 secs  Baseline: Lt - 59 secs Goal status: IN PROGRESS  3.  Pt will be able to place at least 5 blocks using left hand with completion of Box and Blocks test.  Baseline: Lt - 19 blocks Goal status: IN PROGRESS  4.  Pt will improve dynamic standing balance to Good (stand independently unsupported, able to weight shift and cross midline moderately) as needed for managing ADL/IADL tasks. Baseline: Subjective description of Fair (stand independently unsupported, weight shift, and reach ipsilaterally, LOB when crossing midline) with rolling walker Goal status: IN PROGRESS  5.  Pt will verbalize at least two visual compensatory methods to include adaptive equipment as needed independently. Baseline:  Goal status: IN PROGRESS   ASSESSMENT:  CLINICAL IMPRESSION: Pt tolerating OT treatment visit well today. Discussed having all three disciplines in one day typically. Pt reports it is tiring, but doable. He would rather do them all versus having multiple appts spread out through the week. Pt showing steady progress and motivation in regards to therapy goals. Pt would continue to benefit from skilled OT services to address performance deficits as listed below.  PERFORMANCE DEFICITS: in functional skills including ADLs, IADLs, coordination, dexterity, pain, Fine motor control, Gross motor control, mobility, balance, body mechanics, endurance, vision, and UE functional use, cognitive skills including attention, energy/drive, and memory, and psychosocial skills including coping strategies, environmental adaptation, habits, and routines and  behaviors.   IMPAIRMENTS: are limiting patient from ADLs, IADLs, work, and leisure.   CO-MORBIDITIES: may have co-morbidities  that affects occupational performance. Patient will benefit from  skilled OT to address above impairments and improve overall function.  REHAB POTENTIAL: Good  PLAN:  OT FREQUENCY: 2x/week  OT DURATION: 10 weeks (anticipate 8 weeks, but pt going out of town for the summer months)  PLANNED INTERVENTIONS: self care/ADL training, therapeutic exercise, therapeutic activity, neuromuscular re-education, balance training, functional mobility training, electrical stimulation, fluidotherapy, moist heat, cryotherapy, patient/family education, cognitive remediation/compensation, visual/perceptual remediation/compensation, psychosocial skills training, energy conservation, coping strategies training, DME and/or AE instructions, and Re-evaluation  RECOMMENDED OTHER SERVICES: Follow-up with neuro ophthalmologist completed. Pt reports having an "eye doctor" that he goes to annually.  CONSULTED AND AGREED WITH PLAN OF CARE: Patient and family member/caregiver  PLAN FOR NEXT SESSION:  Continue standing tolerance for daily activities ie) golfing with personal putter (particular about which clubs go with which activities), meal prep (as interest allows), vary Blaze Pod placement, continue progression 19f shoulder ROM with table top ball activities and vertical surfaces as tolerated. May also consider previous recommendations including toss ball back and forth if shoulder pain allows, continue visual scanning, desensitization possibly for scalp, balance  Reassess Short term goals for 09/30/22.   [address] Marino Rog[place], [place] 09/19/2022, 8:45 AM

## 2022-09-20 ENCOUNTER — Other Ambulatory Visit: Payer: Self-pay | Admitting: Physical Medicine and Rehabilitation

## 2022-09-22 ENCOUNTER — Ambulatory Visit: Payer: BC Managed Care – PPO

## 2022-09-22 ENCOUNTER — Ambulatory Visit: Payer: BC Managed Care – PPO | Admitting: Physical Therapy

## 2022-09-22 ENCOUNTER — Encounter: Payer: Self-pay | Admitting: Physical Therapy

## 2022-09-22 ENCOUNTER — Encounter: Payer: Self-pay | Admitting: Speech Pathology

## 2022-09-22 ENCOUNTER — Ambulatory Visit: Payer: BC Managed Care – PPO | Admitting: Speech Pathology

## 2022-09-22 VITALS — BP 119/91 | HR 79

## 2022-09-22 DIAGNOSIS — R2681 Unsteadiness on feet: Secondary | ICD-10-CM | POA: Diagnosis not present

## 2022-09-22 DIAGNOSIS — R1310 Dysphagia, unspecified: Secondary | ICD-10-CM | POA: Diagnosis not present

## 2022-09-22 DIAGNOSIS — R471 Dysarthria and anarthria: Secondary | ICD-10-CM | POA: Diagnosis not present

## 2022-09-22 DIAGNOSIS — M6281 Muscle weakness (generalized): Secondary | ICD-10-CM | POA: Diagnosis not present

## 2022-09-22 DIAGNOSIS — R262 Difficulty in walking, not elsewhere classified: Secondary | ICD-10-CM | POA: Diagnosis not present

## 2022-09-22 DIAGNOSIS — R278 Other lack of coordination: Secondary | ICD-10-CM

## 2022-09-22 DIAGNOSIS — R41842 Visuospatial deficit: Secondary | ICD-10-CM

## 2022-09-22 DIAGNOSIS — R42 Dizziness and giddiness: Secondary | ICD-10-CM | POA: Diagnosis not present

## 2022-09-22 DIAGNOSIS — R41840 Attention and concentration deficit: Secondary | ICD-10-CM | POA: Diagnosis not present

## 2022-09-22 DIAGNOSIS — R269 Unspecified abnormalities of gait and mobility: Secondary | ICD-10-CM

## 2022-09-22 NOTE — Therapy (Signed)
OUTPATIENT SPEECH LANGUAGE PATHOLOGY TREATMENT NOTE   Patient Name: Christopher Yu MRN: [id number] DOB:[birth year], 62 y.o., male Today's Date: 09/22/2022  PCP: Thana Ates, MD REFERRING PROVIDER: Charlton Amor, PA-C   END OF SESSION:   End of Session - 09/22/22 1017     Visit Number 5    Number of Visits 17    Date for SLP Re-Evaluation 10/24/22    Authorization Type [employer]    SLP Start Time 1015    SLP Stop Time  1100    SLP Time Calculation (min) 45 min    Activity Tolerance Patient tolerated treatment well              Past Medical History:  Diagnosis Date   Anxiety    Arthritis, rheumatoid (HCC)    dx 1988   CAD (coronary artery disease), native coronary artery    Mild LAD disease with calcification noted at cath 12//27/16    Cardiomyopathy (HCC)    Cardiomyopathy (HCC)    Chronic systolic heart failure (HCC) 05/01/2015   Depression    Deviated nasal septum 06/25/2011   Headache    High cholesterol    Hyperlipidemia    Impingement syndrome of left shoulder 12/22/2017   Macular degeneration    Nausea and vomiting 08/28/2022   Rheumatoid aortitis    Sleep apnea    does not use cpap - UNABLE TO TOLERATE MASK   Sleep apnea in adult    deviated septum repaired, most recent sleep study was negative   Status post total right knee replacement 06/01/2015   Past Surgical History:  Procedure Laterality Date   ANKLE FUSION Right 05/05/2012   related to his arthritis   ANKLE FUSION Left 05/25/2013   related to his arthritis   ANKLE SURGERY Bilateral    APPLICATION OF CRANIAL NAVIGATION N/A 04/11/2022   Procedure: APPLICATION OF CRANIAL NAVIGATION;  Surgeon: Lisbeth Renshaw, MD;  Location: [facility];  Service: Neurosurgery;  Laterality: N/A;   APPLICATION OF CRANIAL NAVIGATION N/A 08/08/2022   Procedure: APPLICATION OF CRANIAL NAVIGATION;  Surgeon: Lisbeth Renshaw, MD;  Location: [facility];  Service: Neurosurgery;  Laterality: N/A;   CARDIAC  CATHETERIZATION N/A 05/01/2015   Procedure: Left Heart Cath and Coronary Angiography;  Surgeon: Lyn Records, MD;  Location: [facility] INVASIVE CV LAB;  Service: Cardiovascular;  Laterality: N/A;   CRANIOTOMY N/A 04/11/2022   Procedure: STEREOTACTIC SUBOCCIPITAL CRANIOTOMY FOR RESECTION OF ARTERIO-VENOUS MALFORMATION;  Surgeon: Lisbeth Renshaw, MD;  Location: [facility] OR;  Service: Neurosurgery;  Laterality: N/A;   FRACTURE SURGERY     left femur fracture x 3   IR ANGIO EXTERNAL CAROTID SEL EXT CAROTID BILAT MOD SED  01/28/2022   IR ANGIO INTRA EXTRACRAN SEL INTERNAL CAROTID BILAT MOD SED  01/28/2022   IR ANGIO INTRA EXTRACRAN SEL INTERNAL CAROTID BILAT MOD SED  04/16/2022   IR ANGIO VERTEBRAL SEL VERTEBRAL BILAT MOD SED  01/28/2022   IR ANGIO VERTEBRAL SEL VERTEBRAL BILAT MOD SED  04/16/2022   KNEE ARTHROSCOPY     right x 4   LAPAROSCOPIC REVISION VENTRICULAR-PERITONEAL (V-P) SHUNT N/A 08/08/2022   Procedure: LAPAROSCOPIC VENTRICULAR-PERITONEAL (V-P) SHUNT;  Surgeon: Harriette Bouillon, MD;  Location: [facility] OR;  Service: General;  Laterality: N/A;   LUMBAR LAMINECTOMY/DECOMPRESSION MICRODISCECTOMY N/A 07/18/2022   Procedure: Repair of Pseudomeningiocele posterior;  Surgeon: Lisbeth Renshaw, MD;  Location: [facility];  Service: Neurosurgery;  Laterality: N/A;   NASAL SEPTOPLASTY W/ TURBINOPLASTY  06/25/2011   Procedure: NASAL SEPTOPLASTY WITH TURBINATE  REDUCTION;  Surgeon: Osborn Coho, MD;  Location: [facility] OR;  Service: ENT;  Laterality: Bilateral;   PLACEMENT OF LUMBAR DRAIN N/A 07/18/2022   Procedure: PLACEMENT OF LUMBAR DRAIN;  Surgeon: Lisbeth Renshaw, MD;  Location: [facility];  Service: Neurosurgery;  Laterality: N/A;   TONSILLECTOMY     TOTAL KNEE ARTHROPLASTY Right 06/01/2015   Procedure: RIGHT TOTAL KNEE ARTHROPLASTY;  Surgeon: Kathryne Hitch, MD;  Location: [facility];  Service: Orthopedics;  Laterality: Right;  Block+general   VENTRICULOPERITONEAL SHUNT Right 08/08/2022   Procedure: LAP ASSITED SHUNT  INSERTION VENTRICULAR-PERITONEAL;  Surgeon: Lisbeth Renshaw, MD;  Location: [facility];  Service: Neurosurgery;  Laterality: Right;   Patient Active Problem List   Diagnosis Date Noted   Nausea and vomiting 08/28/2022   Adjustment disorder 08/06/2022   Protein-calorie malnutrition, severe 07/31/2022   Cerebellar stroke (HCC) 07/29/2022   Ischemic cerebrovascular accident (CVA) (HCC) 07/28/2022   Prolonged QT interval 07/28/2022   Pseudomeningocele 07/18/2022   Pseudomeningocele due to surgical procedure 07/18/2022   Status post craniotomy 04/11/2022   AVM (arteriovenous malformation) brain 04/11/2022   Impingement syndrome of left shoulder 12/22/2017   Impingement syndrome of right shoulder 12/22/2017   Rheumatoid arthritis involving right knee (HCC) 06/01/2015   Status post total right knee replacement 06/01/2015   Cardiomyopathy (HCC) 05/02/2015   Hyperlipidemia    Sleep apnea in adult    Rheumatoid arthritis (HCC)    Chronic systolic heart failure (HCC) 05/01/2015   CAD (coronary artery disease), native coronary artery     ONSET DATE: 07/28/22  REFERRING DIAG:  I63.9 (ICD-10-CM) - Cerebral infarction, unspecified   THERAPY DIAG:  Dysarthria and anarthria  Rationale for Evaluation and Treatment: Rehabilitation  SUBJECTIVE:   SUBJECTIVE STATEMENT: "He talked some with our son in law about golf and he understood him"   PAIN:  Are you having pain? Yes NPRS scale: 8/10 Pain location: right shoulder Pain orientation: Right  PAIN TYPE: aching Pain description: constant  Aggravating factors: movement Relieving factors: rest   OBJECTIVE:   TODAY'S TREATMENT:                                                                                                                                         DATE:    09/22/22: [name] with limited completion of HEP at home as they had company from out of town. He completed HEP using slow rate, over articulation repeating multi-syllabic  words and generating sentence with the word with occasional min verbal cues and modeling. Speech today more slurred than last session - suspect fatigue due to company this weekend. In structured task generating 3 sentence salient description of vegetables, Dona carried over slow rate and over articulation requiring 2 verbal cues to ID unintelligible words (out of 15 sentences) and to repeat intelligibly. In repetition task (due to reduced vision) Printes stated 4 words and Id'd the item that did not  belong and explained way using intelligible speech 10/10x. In conversation re: grilling/cooking, he maintained intelligible speech in this quiet environment, however as noted before, speech with increased imprecision and slur today. Sequencing homework provided - family will need to read this to him  09/18/22: Pt reports ongoing daily completion of effortful swallow to address dysphagia. Subjective improvement of swallow function per pt report with reduced instances of coughing with liquids and no longer using thickened liquids. In structured practice targeting tri-syllabic words at generative sentence level, pt with 100% accuracy. Practice naturally occurring in conversational speech results in ~70% accuracy of 3+ syllable words with increased challenges endorsed by pt. Barrier to communication at home is pt resistant to repeating himself. Education provided regarding increased complexity of using strategies in conversation to encourage pt to give himself grace when breakdowns occur. Add oral reading to HEP, if able d/t vision concerns related to dry eyes.   09/15/22: Reviewed HEP for dysphagia - Prudencio demonstrated tongue press , 5 second hold with hard swallow 7/7x with rare min A. He is not completing chin tuck against resistance or Shaker's due to pain at incision site. He is completing tongue press hard swallow regularly during the day. [name] completed HEP for dysarthria with rare min A carrying over  compensations and over articulation. In structured task repeating and generating sentences with multi-syllabic words, Zeth generated 10/12 sentences intelligibly - requiring repetition on 2/10. Sentences vague and tangential at times, for example, the word representative he stated "A representative is coming to my house on Friday" Questioning and verbal cues revealed that his family from [place] are coming to visit on Friday. Kimon also endorses difficulty processing what he wants to say. Targeted processing and carryover of dysarthria strategies generating 3 sentence description of basic words Layden gave me the clues "my daughters don't take this to school" or "this is what I want to eat" He required ongoing verbal A to generate salient descriptions for his listener, me who doesn't know him personally. Provided home work for this task as well. He plans on attending an award ceremony and graduation for his daughter. Education provided re: strategies and awareness of sensory overload as Quashawn reports he frustrates easily and generated strategy of yelling at the person who " was annoying me" - Verbal cue and education to use strategy of telling [name] he may need to leave or go to a more quiet spot.  09/10/22: Initiated HEP for dysarthria - see patient instructions - Initiated training in compensations for dysarthria (slow, loud. Over articulation and pause) With cues to open mouth wide, make each sound distinct, Cleaven completed sentences intelligibly 18/20.  Using compensations in structured task, generating 3 sentence description of basic object, Branndon carried over strategies to be intelligible 10/12 sentences with consistent modeling and occasional min verbal cues. Repetition required 2/12 sentences with mod I at correcting errors to intelligible speech. In simple conversation he maintained intelligible speech with rare min A over 4 minutes. Okley required verbal cue to look right with 2 swallows when he took  tylenol  08/29/22: updated HEP. Direct instruction for swallow exercises with pt able to confirm understanding via demonstration with min-A.      PATIENT EDUCATION: Education details: POC, goals, evaluation results/recommendations Person educated: Patient and Spouse Education method: Explanation, Demonstration, Verbal cues, and Handouts Education comprehension: verbalized understanding, returned demonstration, and needs further education     GOALS: Goals reviewed with patient? Yes   SHORT TERM GOALS: Target date: 10/03/2022   Pt  will teach back dysarthria strategies and compensations with mod-I Baseline: Goal status: ONGOING   2.   Pt will employ dysarthria strategies during structured practice activities resulting in intelligible production at mult-sentence level in 80% of trials Baseline:  Goal status: ONGOING   3. Pt will demonstrate use of swallow compensations/strategies with PO trials with mod-I over 2 sessions  Baseline:  Goal status: ONGOING   4.  Pt will complete HEP 6/7 days over 1 week period with mod-I Baseline:  Goal status: ONGOING     LONG TERM GOALS: Target date: 10/24/2022   Pt will complete repeat MBSS Baseline:  Goal status: ONGOING   2.  Pt will carryover dysarthria strategies/compensations during 20 minute conversation resulting in 90% ineligibility  Baseline:  Goal status: ONGOING   3.  Pt and spouse will report subjective report of reduced need for requests for repetition by 50% at home, over 1 week period Baseline:  Goal status:ONGOING   4.  Pt will report improvement via PROM by dc  Baseline:  Goal status: ONGOING     ASSESSMENT:   CLINICAL IMPRESSION: Patient is a 62 y.o. M who was seen today for dysphagia and motor speech eval s/p cerebellar stroke. Evaluation reveals moderate dysarthria and recent MBSS demonstrates mild pharyngeal dysphagia. Pt's speech is c/b irregular imprecise articulation and distortions, volume decay on expanded  utterances, and reduced use of prosody. Pt reports frustration regarding his communicative abilities, tells SLP his vocation has high level of demands for communication and he feels he will "never get back there, it's impossible." As evaluation progressed, pt with increased frustration with SLP providing education to encourage participation and positive mindset for engagement in therapy course. Brief introduction to dysarthria strategies initiated. Reviewed instrumental swallow study results and updated pt's dysphagia HEP to accommodate preferences while targeting demonstrated deficits. Will plan to request repeat MBSS following usual completion of dysphagia HEP over 1-2 month period. Pt would benefit from skilled ST to address aforementioned deficits to improve QoL, enhance communication efficacy, and facilitate improved swallow function/safety .      OBJECTIVE IMPAIRMENTS: include dysarthria and dysphagia. These impairments are limiting patient from return to work, effectively communicating at home and in community, and safety when swallowing. Factors affecting potential to achieve goals and functional outcome are cooperation/participation level. Patient will benefit from skilled SLP services to address above impairments and improve overall function.   REHAB POTENTIAL: Good   PLAN:   SLP FREQUENCY: 2x/week   SLP DURATION: 8 weeks   PLANNED INTERVENTIONS: Aspiration precaution training, Pharyngeal strengthening exercises, Diet toleration management , Trials of upgraded texture/liquids, Cueing hierachy, Internal/external aids, Functional tasks, Multimodal communication approach, SLP instruction and feedback, Compensatory strategies, Patient/family education, and Re-evaluation     Kaileena Obi, Radene Journey, CCC-SLP 09/22/2022, 11:01 AM

## 2022-09-22 NOTE — Patient Instructions (Signed)
Access Code: [email or web address] URL: [email or web address] Date: 09/22/2022 Prepared by: Camille Bal  Exercises - Pencil Pushups  - 1 x daily - 5 x weekly - 3 sets - 5 reps - Corner Balance Feet Together With Eyes Closed  - 1 x daily - 5 x weekly - 1 sets - 3-4 reps - 30-45 seconds hold - Standing Tandem Balance with Counter Support  - 1 x daily - 5 x weekly - 1 sets - 2-3 reps - 45 seconds hold - Tandem Walking with Counter Support  - 1 x daily - 5 x weekly - 3 sets - 10 reps - Backward Walking with Counter Support  - 1 x daily - 5 x weekly - 3 sets - 10 reps - Walking with Head Rotation  - 1 x daily - 5 x weekly - 3 sets - 10 reps

## 2022-09-22 NOTE — Therapy (Signed)
OUTPATIENT OCCUPATIONAL THERAPY NEURO TREATMENT  Patient Name: Christopher Yu MRN: [id number] DOB:1960/10/14, 62 y.o., male Today's Date: 09/22/2022  PCP: Thana Ates, MD REFERRING PROVIDER: Charlton Amor, PA-C  END OF SESSION:  OT End of Session - 09/22/22 0843     Visit Number 7    Number of Visits 20    Date for OT Re-Evaluation 06/20/22    Authorization Type [employer]    OT Start Time 0845    OT Stop Time 0930    OT Time Calculation (min) 45 min    Activity Tolerance Patient tolerated treatment well;Patient limited by fatigue    Behavior During Therapy [facility] for tasks assessed/performed;Flat affect             Past Medical History:  Diagnosis Date   Anxiety    Arthritis, rheumatoid (HCC)    dx 1988   CAD (coronary artery disease), native coronary artery    Mild LAD disease with calcification noted at cath 12//27/16    Cardiomyopathy (HCC)    Cardiomyopathy (HCC)    Chronic systolic heart failure (HCC) 05/01/2015   Depression    Deviated nasal septum 06/25/2011   Headache    High cholesterol    Hyperlipidemia    Impingement syndrome of left shoulder 12/22/2017   Macular degeneration    Nausea and vomiting 08/28/2022   Rheumatoid aortitis    Sleep apnea    does not use cpap - UNABLE TO TOLERATE MASK   Sleep apnea in adult    deviated septum repaired, most recent sleep study was negative   Status post total right knee replacement 06/01/2015   Past Surgical History:  Procedure Laterality Date   ANKLE FUSION Right 05/05/2012   related to his arthritis   ANKLE FUSION Left 05/25/2013   related to his arthritis   ANKLE SURGERY Bilateral    APPLICATION OF CRANIAL NAVIGATION N/A 04/11/2022   Procedure: APPLICATION OF CRANIAL NAVIGATION;  Surgeon: Lisbeth Renshaw, MD;  Location: [facility] OR;  Service: Neurosurgery;  Laterality: N/A;   APPLICATION OF CRANIAL NAVIGATION N/A 08/08/2022   Procedure: APPLICATION OF CRANIAL NAVIGATION;  Surgeon: Lisbeth Renshaw, MD;  Location: [facility] OR;  Service: Neurosurgery;  Laterality: N/A;   CARDIAC CATHETERIZATION N/A 05/01/2015   Procedure: Left Heart Cath and Coronary Angiography;  Surgeon: Lyn Records, MD;  Location: [facility] INVASIVE CV LAB;  Service: Cardiovascular;  Laterality: N/A;   CRANIOTOMY N/A 04/11/2022   Procedure: STEREOTACTIC SUBOCCIPITAL CRANIOTOMY FOR RESECTION OF ARTERIO-VENOUS MALFORMATION;  Surgeon: Lisbeth Renshaw, MD;  Location: [facility] OR;  Service: Neurosurgery;  Laterality: N/A;   FRACTURE SURGERY     left femur fracture x 3   IR ANGIO EXTERNAL CAROTID SEL EXT CAROTID BILAT MOD SED  01/28/2022   IR ANGIO INTRA EXTRACRAN SEL INTERNAL CAROTID BILAT MOD SED  01/28/2022   IR ANGIO INTRA EXTRACRAN SEL INTERNAL CAROTID BILAT MOD SED  04/16/2022   IR ANGIO VERTEBRAL SEL VERTEBRAL BILAT MOD SED  01/28/2022   IR ANGIO VERTEBRAL SEL VERTEBRAL BILAT MOD SED  04/16/2022   KNEE ARTHROSCOPY     right x 4   LAPAROSCOPIC REVISION VENTRICULAR-PERITONEAL (V-P) SHUNT N/A 08/08/2022   Procedure: LAPAROSCOPIC VENTRICULAR-PERITONEAL (V-P) SHUNT;  Surgeon: Harriette Bouillon, MD;  Location: [facility] OR;  Service: General;  Laterality: N/A;   LUMBAR LAMINECTOMY/DECOMPRESSION MICRODISCECTOMY N/A 07/18/2022   Procedure: Repair of Pseudomeningiocele posterior;  Surgeon: Lisbeth Renshaw, MD;  Location: [facility] OR;  Service: Neurosurgery;  Laterality: N/A;   NASAL SEPTOPLASTY W/ TURBINOPLASTY  06/25/2011   Procedure: NASAL SEPTOPLASTY WITH TURBINATE REDUCTION;  Surgeon: Osborn Coho, MD;  Location: [facility] OR;  Service: ENT;  Laterality: Bilateral;   PLACEMENT OF LUMBAR DRAIN N/A 07/18/2022   Procedure: PLACEMENT OF LUMBAR DRAIN;  Surgeon: Lisbeth Renshaw, MD;  Location: [facility] OR;  Service: Neurosurgery;  Laterality: N/A;   TONSILLECTOMY     TOTAL KNEE ARTHROPLASTY Right 06/01/2015   Procedure: RIGHT TOTAL KNEE ARTHROPLASTY;  Surgeon: Kathryne Hitch, MD;  Location: [facility] ORS;  Service: Orthopedics;  Laterality: Right;   Block+general   VENTRICULOPERITONEAL SHUNT Right 08/08/2022   Procedure: LAP ASSITED SHUNT INSERTION VENTRICULAR-PERITONEAL;  Surgeon: Lisbeth Renshaw, MD;  Location: [facility] OR;  Service: Neurosurgery;  Laterality: Right;   Patient Active Problem List   Diagnosis Date Noted   Nausea and vomiting 08/28/2022   Adjustment disorder 08/06/2022   Protein-calorie malnutrition, severe 07/31/2022   Cerebellar stroke (HCC) 07/29/2022   Ischemic cerebrovascular accident (CVA) (HCC) 07/28/2022   Prolonged QT interval 07/28/2022   Pseudomeningocele 07/18/2022   Pseudomeningocele due to surgical procedure 07/18/2022   Status post craniotomy 04/11/2022   AVM (arteriovenous malformation) brain 04/11/2022   Impingement syndrome of left shoulder 12/22/2017   Impingement syndrome of right shoulder 12/22/2017   Rheumatoid arthritis involving right knee (HCC) 06/01/2015   Status post total right knee replacement 06/01/2015   Cardiomyopathy (HCC) 05/02/2015   Hyperlipidemia    Sleep apnea in adult    Rheumatoid arthritis (HCC)    Chronic systolic heart failure (HCC) 05/01/2015   CAD (coronary artery disease), native coronary artery     ONSET DATE: 08/21/2022 (date of referral)  REFERRING DIAG: I63.9 (ICD-10-CM) - Cerebral infarction, unspecified  THERAPY DIAG:  Muscle weakness (generalized)  Other lack of coordination  Unsteadiness on feet  Visuospatial deficit  Attention and concentration deficit  Rationale for Evaluation and Treatment: Rehabilitation  SUBJECTIVE:   SUBJECTIVE STATEMENT: Wife reports altering cardiac medication per doctor recommendation. Pt also stating another prednisone taper due to continued pain with shoulder.  Pt accompanied by: self and significant other, [name]  PERTINENT HISTORY: CT/MRI showed new small focus of acute infarction in the right superior cerebellar hemisphere. Cerebellar AVM resection 12/23, pseudomeningocele s/p craniotomy 3/24, acute R cerebellar  infarct, hydrocephalus and VP shunt was placed on 08/08/22, dysphagia, dysarthria, aphasia. PMH: rheumatoid arthritis, bilateral ankle fusion (2014 and 2015), cardiomyopathy, CAD, R TKA, cardiomyopathy, chronic systolic heart failure, macular degeneration, OSA on CPAP  PRECAUTIONS: Fall and Other: swallow precautions with modification with left head tilt and supervision if drinking thinned liquids, per caregiver not to lift, history of orthostatic hypotension resulting in falls; no driving  WEIGHT BEARING RESTRICTIONS: No  PAIN:  Are you having pain? Rt shoulder 4/10 from RA flare up, constant (premorbid) while at rest, has not been able to alter pain  FALLS: Has patient fallen in last 6 months? Yes. Number of falls 3 - after the first surgery, got dizzy and fell, using the walker, when up and moving, struggled with orthostatic hypotension but medical team has since decreased BP medications  LIVING ENVIRONMENT: Lives with: lives with their spouse, two teenage daughters (also has dtr who lives in [place]), and 175 lb dog Lives in: House/apartment - Able to live on main floor Stairs: Yes: Internal: ~14 steps; on right going up and External: 3 steps; on right going up Has following equipment at home: Dan Humphreys - 2 wheeled and Wheelchair (manual) Bathroom setup: [employer], regular height toilet  PLOF: Prior to December, pt completely independent with  ADL/IADLs, ambulatory without device, pt enjoys golfing, driving, 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  PATIENT GOALS: Return to golf, improve vision for work, travel in June for daughter's outdoor wedding (be off the walker ideally)  OBJECTIVE: (results from evaluation unless otherwise stated)  HAND DOMINANCE: Right  ADLs: Eating: Independent with self-feeding, wife cuts items due to impaired coordination per her report Grooming: Independent mostly, but reports it's painful to brush hair from surgical procedure in December UB Dressing: Supervision,  increased time and wife present if needed LB Dressing: Supervision increased time and wife present if needed. Sometimes sits to don pants Toileting: Supervision, more effort, wife there in case pt loses balance Bathing: Supervision for balance Tub Shower transfers: Supervision likely; however, sponge bath currently due to hand held shower head breaking and plumber coming 5/1.  Equipment:  Toilet riser with armrests , shower chair, bedrail on bed  IADLs: Shopping: Currently unable, previously independent Light housekeeping: Currently unable, previously did laundry Meal Prep: Currently unable, but previously assisted in task Community mobility: and household, pt utilizing rolling walker at all times. Typically sedentary in recliner most of the day. Gets up for bathroom.  Medication management: Wife Presenter, broadcasting: Currently unable, pt typically did before Handwriting: 75% legible and Mild micrographia   MOBILITY STATUS:  Pt ambulatory with RW, wife providing supervision  at all times  POSTURE COMMENTS:  rounded shoulders Sitting balance: Moves/returns truncal midpoint 1-2 inches in multiple planes - Wife describes she believes it is more of a coordination issue, overshooting and then losing balance.   ACTIVITY TOLERANCE: Activity tolerance: Subjective report of significant fatigue  FUNCTIONAL OUTCOME MEASURES: FOTO: 58 score  UPPER EXTREMITY ROM:    Active ROM Right eval Left eval  Shoulder flexion [facility]  Shoulder abduction [facility]  Shoulder adduction [facility] WFL  Shoulder extension [facility]  Shoulder internal rotation    Shoulder external rotation    Elbow flexion WFL WFL  Elbow extension [facility] WFL  Wrist flexion    Wrist extension    Wrist ulnar deviation    Wrist radial deviation    Wrist pronation    Wrist supination    (Blank rows = not tested)  UPPER EXTREMITY MMT:     MMT Right eval Left eval  Shoulder flexion 4+/5 4+/5  Shoulder abduction     Shoulder adduction    Shoulder extension    Shoulder internal rotation    Shoulder external rotation    Middle trapezius    Lower trapezius    Elbow flexion 4+/5 4+/5  Elbow extension 4+/5 4+/5  Wrist flexion    Wrist extension    Wrist ulnar deviation    Wrist radial deviation    Wrist pronation    Wrist supination    (Blank rows = not tested)  HAND FUNCTION: Grip strength: Right: 67.2 lbs; Left: 66.3 lbs and 3 point pinch: Right: 12 lbs, Left: 15 lbs  COORDINATION: 9 Hole Peg test: Right: 31 sec; Left: 59 sec Box and Blocks:  Right 27 blocks, Left 19blocks  SENSATION: WFL  EDEMA: None  MUSCLE TONE: WFL  COGNITION: Overall cognitive status:  Grossly WFL, possible for attention deficits.  VISION: Subjective report: Pt reports some jerky vision since December Baseline vision: Wears glasses all the time Visual history: cataracts and macular degeneration  VISION ASSESSMENT: Impaired Eye alignment: Impaired: Right eye seemed to have amblyopia-type appearance with visual pursuits  Reading acuity: Pt reports difficulty reading instructions at this time, he reports  all letters merge together Tracking/Visual pursuits: Right eye appears to be slower than left Saccades: overshoots - right eye from temporal to nasal Convergence: WFL Visual Fields: Grossly intact; however, decreased peripheral vision to right side when provided with moving target with detection of object at 30* Diplopia assessment: When given macular degeneration Amsler grid, pt reports double vision of right eye when left eye occluded. Quadrants - WFL   Patient has difficulty with following activities due to following visual impairments: Work related tasks  PERCEPTION: WFL  PRAXIS: WFL  OBSERVATIONS: Patient arrives to session ambulating with rolling walker. Patient wife primarily speaks during the session given patient's dysarthria. Per caregiver, patient had a surgery to address AVM and then had  pseudomengiocele and underwent 3 surgeries as a result of complications. Per report, patient was D/C from hospital and then returned with symptoms of acute stroke. Prior to December patient was fully independent, working full time in IT, and golfing regularly every weekend; however, patient is now fully supervised for all ADLs, not working, and now mobilizing either via manual W/C or walker. Patient reports increased nausea disrupting appetite. Family reports going to the ED on last Wednesday due to new reports of nausea and double vision and there was concern for another stroke; however, family decided to the return home when CT came back clear and did not want to go through additional MRI. Per family, inpatient rehab physician aware of this and recommended they call their neurosurgeon for follow up as well and did not advise against continuing outpatient therapy. Family also expresses concern in not knowing how to truly recognize a stroke as patient now has such extensive deficits. Family also reports that a therapist in inpatient rehab noticed unique eye movements and recommend they also see a therapist that could help address vestibular concerns. Wife showing OT video today. Video showed pt performing visual pursuits with jerky eye movement going from left to right sides, right eye impacted.  09/09/22: Pt received from PT lying on mat table. Pt stating taking a rest break. Pt denies pain or dizziness.    TODAY'S TREATMENT:                                                                                                                              DATE:  Convergence/divergence activity - Pt placing medium size pegs into cone while shifting from divergence to convergence. Pt did 20 pegs x 3 trials. Pt tolerated well. Pt then instructed to find cones placed throughout clinic on varying surface heights to include ground level. With cane, pt ambulating in clinic and able to visually scan and locate 11/12 items.  Pt reporting 6/10 on RPE scale following activity. HR 94. Seated rest as needed. Pt performed this task x3 trials with supervision for safety in regards to balance and gait belt placed. Second trial pt locating 10/12 cones, and third trial instruction to find final 2 cones. Pt remained at 6/10 on RPE  scale. OT also incorporating gross motor skills for patient to grasp cone, then place on top of each other.  PATIENT EDUCATION: Education details: Reviewed dtr's wedding in June and goals to achieve feeling confident and independent. Education provided to: Patient, spouse Delivered by: Verbal cues Pt receptibe   HOME EXERCISE PROGRAM: 09/02/22: Coordination HEP 09/04/22: Modified coordination HEP (for vision) and vision HEP 09/18/22: No hand out provided but encouraged to work on standing activities throughout the day and combine with table top ball rolling for shoulder ROM.  GOALS: Goals reviewed with patient? Yes  SHORT TERM GOALS: Target date: 09/30/2022   Pt will be independent in LUE HEP. Baseline: Initiated Goal status: IN PROGRESS  2.  Pt will demonstrate improved fine motor coordination for ADLs as evidenced by decreasing 9 hole peg test score for left hand by >/=5 secs  Baseline: Lt - 59 secs Goal status: IN PROGRESS  3.  Pt will be able to place at least 3 blocks using left hand with completion of Box and Blocks test.  Baseline: Lt- 19 blocks Goal status: IN PROGRESS  4.  Pt will tolerate >/=10 minutes of standing activity to simulate activity tolerance needed for ADL/IADL management. Baseline: Subjective description of sitting down for most tasks Goal status: IN PROGRESS  5.  Pt will write a sentence with no significant decrease in size and maintain 100% legibility.  Baseline: 75% legibility and mild micrographia Goal status: INITIAL  6.  Pt will improve visual closure skills to recognize and complete visual patterns or missing parts of visual stimuli, enhancing visual  perception and problem-solving abilities. Baseline: Overshooting eye movements, impaired peripheral vision, merging of letters per subjective report Goal status: IN PROGRESS  LONG TERM GOALS: Target date: 11/11/2022   Pt will complete FOTO assessment at time of discharge scoring 68 or greater indicating functional progression with ADL and IADL completion. Baseline: 58  Goal status: INITIAL  2.  Pt will demonstrate improved fine motor coordination for ADLs as evidenced by decreasing 9 hole peg test score for left hand by >/=10 secs  Baseline: Lt - 59 secs Goal status: IN PROGRESS  3.  Pt will be able to place at least 5 blocks using left hand with completion of Box and Blocks test.  Baseline: Lt - 19 blocks Goal status: IN PROGRESS  4.  Pt will improve dynamic standing balance to Good (stand independently unsupported, able to weight shift and cross midline moderately) as needed for managing ADL/IADL tasks. Baseline: Subjective description of Fair (stand independently unsupported, weight shift, and reach ipsilaterally, LOB when crossing midline) with rolling walker Goal status: IN PROGRESS  5.  Pt will verbalize at least two visual compensatory methods to include adaptive equipment as needed independently. Baseline:  Goal status: IN PROGRESS   ASSESSMENT:  CLINICAL IMPRESSION: Pt and wife discussing goals they still desire to address, which include fine motor skills (grasping items, keyboarding), balance, and visual scanning (switching between monitor screens, looking at phone). Wife reports LUE having decreased control. Pt continues to make steady progress towards goals. Pt would benefit from skilled OT services to maximize independence in ADLs, IADLs, and leisure activity.   PERFORMANCE DEFICITS: in functional skills including ADLs, IADLs, coordination, dexterity, pain, Fine motor control, Gross motor control, mobility, balance, body mechanics, endurance, vision, and UE functional use,  cognitive skills including attention, energy/drive, and memory, and psychosocial skills including coping strategies, environmental adaptation, habits, and routines and behaviors.   IMPAIRMENTS: are limiting patient from ADLs, IADLs,  work, and leisure.   CO-MORBIDITIES: may have co-morbidities  that affects occupational performance. Patient will benefit from skilled OT to address above impairments and improve overall function.  REHAB POTENTIAL: Good  PLAN:  OT FREQUENCY: 2x/week  OT DURATION: 10 weeks (anticipate 8 weeks, but pt going out of town for the summer months)  PLANNED INTERVENTIONS: self care/ADL training, therapeutic exercise, therapeutic activity, neuromuscular re-education, balance training, functional mobility training, electrical stimulation, fluidotherapy, moist heat, cryotherapy, patient/family education, cognitive remediation/compensation, visual/perceptual remediation/compensation, psychosocial skills training, energy conservation, coping strategies training, DME and/or AE instructions, and Re-evaluation  RECOMMENDED OTHER SERVICES: Follow-up with neuro ophthalmologist completed. Pt reports having an "eye doctor" that he goes to annually.  CONSULTED AND AGREED WITH PLAN OF CARE: Patient and family member/caregiver  PLAN FOR NEXT SESSION:  Continue standing tolerance for daily activities ie) golfing with personal putter (particular about which clubs go with which activities), meal prep (as interest allows), vary Blaze Pod placement, continue progression of shoulder ROM with table top ball activities and vertical surfaces as tolerated. May also consider previous recommendations including toss ball back and forth if shoulder pain allows, continue visual scanning, desensitization possibly for scalp, balance  Reassess Short term goals for 09/30/22.   [address] Dawnette M[place], [place] 09/22/2022, 9:28 AM

## 2022-09-22 NOTE — Therapy (Unsigned)
OUTPATIENT PHYSICAL THERAPY NEURO TREATMENT   Patient Name: Christopher Yu MRN: [id number] DOB:[birth year], 62 y.o., male Today's Date: 09/22/2022   PCP: Thana Ates, MD REFERRING PROVIDER: Charlton Amor, PA-C  END OF SESSION:  PT End of Session - 09/22/22 0939     Visit Number 8    Number of Visits 17    Date for PT Re-Evaluation 11/07/22    Authorization Type BC[employer]    PT Start Time 0932    PT Stop Time 1012    PT Time Calculation (min) 40 min    Equipment Utilized During Treatment Gait belt    Activity Tolerance Patient tolerated treatment well   RA flare in bilateral shoulders   Behavior During Therapy WFL for tasks assessed/performed;Flat affect             Past Medical History:  Diagnosis Date   Anxiety    Arthritis, rheumatoid (HCC)    dx 1988   CAD (coronary artery disease), native coronary artery    Mild LAD disease with calcification noted at cath 12//27/16    Cardiomyopathy (HCC)    Cardiomyopathy (HCC)    Chronic systolic heart failure (HCC) 05/01/2015   Depression    Deviated nasal septum 06/25/2011   Headache    High cholesterol    Hyperlipidemia    Impingement syndrome of left shoulder 12/22/2017   Macular degeneration    Nausea and vomiting 08/28/2022   Rheumatoid aortitis    Sleep apnea    does not use cpap - UNABLE TO TOLERATE MASK   Sleep apnea in adult    deviated septum repaired, most recent sleep study was negative   Status post total right knee replacement 06/01/2015   Past Surgical History:  Procedure Laterality Date   ANKLE FUSION Right 05/05/2012   related to his arthritis   ANKLE FUSION Left 05/25/2013   related to his arthritis   ANKLE SURGERY Bilateral    APPLICATION OF CRANIAL NAVIGATION N/A 04/11/2022   Procedure: APPLICATION OF CRANIAL NAVIGATION;  Surgeon: Lisbeth Renshaw, MD;  Location: [facility] OR;  Service: Neurosurgery;  Laterality: N/A;   APPLICATION OF CRANIAL NAVIGATION N/A 08/08/2022   Procedure:  APPLICATION OF CRANIAL NAVIGATION;  Surgeon: Lisbeth Renshaw, MD;  Location: [facility] OR;  Service: Neurosurgery;  Laterality: N/A;   CARDIAC CATHETERIZATION N/A 05/01/2015   Procedure: Left Heart Cath and Coronary Angiography;  Surgeon: Lyn Records, MD;  Location: [facility] INVASIVE CV LAB;  Service: Cardiovascular;  Laterality: N/A;   CRANIOTOMY N/A 04/11/2022   Procedure: STEREOTACTIC SUBOCCIPITAL CRANIOTOMY FOR RESECTION OF ARTERIO-VENOUS MALFORMATION;  Surgeon: Lisbeth Renshaw, MD;  Location: [facility] OR;  Service: Neurosurgery;  Laterality: N/A;   FRACTURE SURGERY     left femur fracture x 3   IR ANGIO EXTERNAL CAROTID SEL EXT CAROTID BILAT MOD SED  01/28/2022   IR ANGIO INTRA EXTRACRAN SEL INTERNAL CAROTID BILAT MOD SED  01/28/2022   IR ANGIO INTRA EXTRACRAN SEL INTERNAL CAROTID BILAT MOD SED  04/16/2022   IR ANGIO VERTEBRAL SEL VERTEBRAL BILAT MOD SED  01/28/2022   IR ANGIO VERTEBRAL SEL VERTEBRAL BILAT MOD SED  04/16/2022   KNEE ARTHROSCOPY     right x 4   LAPAROSCOPIC REVISION VENTRICULAR-PERITONEAL (V-P) SHUNT N/A 08/08/2022   Procedure: LAPAROSCOPIC VENTRICULAR-PERITONEAL (V-P) SHUNT;  Surgeon: Harriette Bouillon, MD;  Location: [facility] OR;  Service: General;  Laterality: N/A;   LUMBAR LAMINECTOMY/DECOMPRESSION MICRODISCECTOMY N/A 07/18/2022   Procedure: Repair of Pseudomeningiocele posterior;  Surgeon: Lisbeth Renshaw, MD;  Location:  [facility] OR;  Service: Neurosurgery;  Laterality: N/A;   NASAL SEPTOPLASTY W/ TURBINOPLASTY  06/25/2011   Procedure: NASAL SEPTOPLASTY WITH TURBINATE REDUCTION;  Surgeon: Osborn Coho, MD;  Location: [facility] OR;  Service: ENT;  Laterality: Bilateral;   PLACEMENT OF LUMBAR DRAIN N/A 07/18/2022   Procedure: PLACEMENT OF LUMBAR DRAIN;  Surgeon: Lisbeth Renshaw, MD;  Location: [facility] OR;  Service: Neurosurgery;  Laterality: N/A;   TONSILLECTOMY     TOTAL KNEE ARTHROPLASTY Right 06/01/2015   Procedure: RIGHT TOTAL KNEE ARTHROPLASTY;  Surgeon: Kathryne Hitch, MD;  Location: [facility]  ORS;  Service: Orthopedics;  Laterality: Right;  Block+general   VENTRICULOPERITONEAL SHUNT Right 08/08/2022   Procedure: LAP ASSITED SHUNT INSERTION VENTRICULAR-PERITONEAL;  Surgeon: Lisbeth Renshaw, MD;  Location: [facility] OR;  Service: Neurosurgery;  Laterality: Right;   Patient Active Problem List   Diagnosis Date Noted   Nausea and vomiting 08/28/2022   Adjustment disorder 08/06/2022   Protein-calorie malnutrition, severe 07/31/2022   Cerebellar stroke (HCC) 07/29/2022   Ischemic cerebrovascular accident (CVA) (HCC) 07/28/2022   Prolonged QT interval 07/28/2022   Pseudomeningocele 07/18/2022   Pseudomeningocele due to surgical procedure 07/18/2022   Status post craniotomy 04/11/2022   AVM (arteriovenous malformation) brain 04/11/2022   Impingement syndrome of left shoulder 12/22/2017   Impingement syndrome of right shoulder 12/22/2017   Rheumatoid arthritis involving right knee (HCC) 06/01/2015   Status post total right knee replacement 06/01/2015   Cardiomyopathy (HCC) 05/02/2015   Hyperlipidemia    Sleep apnea in adult    Rheumatoid arthritis (HCC)    Chronic systolic heart failure (HCC) 05/01/2015   CAD (coronary artery disease), native coronary artery     ONSET DATE: 08/21/2022 (referral date)  REFERRING DIAG: I63.9 (ICD-10-CM) - Cerebral infarction, unspecified  THERAPY DIAG:  Muscle weakness (generalized)  Other lack of coordination  Unsteadiness on feet  Abnormality of gait and mobility  Rationale for Evaluation and Treatment: Rehabilitation  SUBJECTIVE:                                                                                                                                                                                             SUBJECTIVE STATEMENT: Patient arrives with wife, ambulating with SPC.  He has walked a lot this weekend due to visiting with daughters and attending a banquet.  He denies falls or near misses.  He states his HEP is going well and  getting easier.  Pt accompanied by: self and significant other - wife [name]  PERTINENT HISTORY: cerebellar AVM resection 12/23, pseudomeningocele s/p craniotomy 3/24, acute R cerebellar infarct, hydrocephalus and VP shunt was placed on 08/08/22, dysphagia,  dysarthria, aphasia  PMH: rheumatoid arthritis, bilateral ankle fusion (2014 and 2015), cardiomyopathy, CAD, R TKA, cardiomyopathy, chronic systolic heart failure, macular degeneration, OSA on CPAP  PAIN:  Are you having pain? Yes: NPRS scale: 5/10 Pain location: bilateral shoulders Pain description: sore Aggravating factors: moving them Relieving factors: prednisone taper-pt has started second one for RA flare  PRECAUTIONS: Fall and Other: swallow precautions with modification with left head tilt and supervision if drinking thinned liquids, per caregiver not to lift, history of orthostatic hypotension resulting in falls  PATIENT GOALS: "To get back to playing golf and moving like I used to."  OBJECTIVE:   DIAGNOSTIC FINDINGS:   CT on 08/27/2022: IMPRESSION: 1. No evidence of acute infarct or hemorrhage. 2. Stable right-sided ventriculostomy catheter, tip within the right lateral ventricle. Stable size of the lateral ventricles without hydrocephalus. 3. Postsurgical changes from prior suboccipital craniectomy, with underlying encephalomalacia in the cerebellum. The pseudomeningocele seen on prior study has increased slightly in size since prior exam, of uncertain significance. 4. Decreased size of the right superior cerebellar intraparenchymal cyst, with decreased mass effect upon the fourth ventricle.  MR Brain on 07/28/2022: IMPRESSION: 1. New small focus of acute infarction in the right superior cerebellar hemisphere with surrounding area of increased T2 signal and cystic change, which is new since the prior MRI dated 07/05/2022 and suspicious for now chronic infarct. 2. Postoperative changes of prior left posterior  fossa resection. Redemonstrated foci of gas within the resection cavity, fourth ventricle and extra-axial spaces, likely secondary to recent pseudomeningocele repair and/or lumbar drain placement/removal. 3. Decreased size of the fluid collection in the dorsal soft tissues overlying the suboccipital craniectomy.  TODAY'S TREATMENT:                                                                                                                              -x200' w/ SBA-CGA cone search at floor level w/ forward bend to retrieve, no LOB, pt retrieves 9/9 cones on initial round At counter: -Forward walking 6x10' w/ head turns > head nods 4x10' (pt has some discomfort w/ neck extension so d/c'd) -Forward tandem walking 8x10' -Backwards walking 2x10' + 4x10' CGA, pt reports fatigue following 4 continuous activity requiring prolonged seated rest break, SpO2 98%, HR 96bpm -Forwards walking w/ eyes closed w/ unilateral UE support and CGA 6x10'  -Pt ambulates CGA x500' outdoors over [employer].  He demonstrates most difficulty w/ incline surfaces and turns with most unsteadiness noted w/ left turns (increased lateral lean w/ narrowed BOS, uncontrolled speed), cued to use auditory feedback as feet scuff the ground to improve clearance.  He is severely fatigued following task.  HR 107 bpm, SpO2 97%.  PT ambulates x96' w/ pt an wife to speech therapy appt.  PATIENT EDUCATION: Education details:  Encouraged compliance to HEP.    Discussed pt using SPC in home and slowly progressing to outside home w/ CGA from wife.  Discussed ~500' distance as  current distance of tolerance based on today and needing to switch to more restrictive AD if going longer distances of very unlevel due to likely fatigue increasing fall risk.  Additions to HEP and difficulty with dual tasking especially during head turns (lack of continuous motion). Person educated: Patient and Spouse Education method: Software engineer Education comprehension: verbalized understanding and needs further education  HOME EXERCISE PROGRAM: You Can Walk For A Certain Length Of Time Each Day                          Walk 2 minutes 1 times per day.             Increase 2  minutes every 3 days              Work up to 30 minutes (1-2 times per day).               Example:                         Day 1-2           4-5 minutes     3 times per day                         Day 7-8           10-12 minutes 2-3 times per day                         Day 13-14       20-22 minutes 1-2 times per day  Oculomotor: Saccades    Holding two targets positioned side by side __6__ inches apart, move eyes quickly from target to target as head stays still.  Perform sitting. Repeat __10__ times per session. Do __3__ sessions per day.  Access Code: [email or web address] URL: [email or web address] Date: 09/12/2022 Prepared by: Camille Bal  Exercises - Pencil Pushups  - 1 x daily - 5 x weekly - 3 sets - 5 reps - Corner Balance Feet Together With Eyes Closed  - 1 x daily - 5 x weekly - 1 sets - 3-4 reps - 30-45 seconds hold - Standing Tandem Balance with Counter Support  - 1 x daily - 5 x weekly - 1 sets - 2-3 reps - 45 seconds hold - Tandem Walking with Counter Support  - 1 x daily - 5 x weekly - 3 sets - 10 reps - Backward Walking with Counter Support  - 1 x daily - 5 x weekly - 3 sets - 10 reps - Walking with Head Rotation  - 1 x daily - 5 x weekly - 3 sets - 10 reps  GOALS: Goals reviewed with patient? Yes  SHORT TERM GOALS: Target date: 09/26/2022  Patient will demonstrate 100% compliance with initial HEP to continue to progress between physical therapy sessions.   Baseline: To be provided Goal status: INITIAL  2.  Patient will improve their 5x Sit to Stand score to less than 18 seconds to demonstrate a decreased risk for falls and improved LE strength.   Baseline: 22.45" without UE support/unsteady WBOS Goal  status: INITIAL  3.  Patient will improve TUG score to 17 seconds or less with LRAD to indicate a decreased risk of falls and demonstrate improved overall mobility.  Baseline: 21.27" with RW Goal status: INITIAL  4.  Patient  will improve gait speed to 0.87 m/s to indicate improvement to the level of community ambulator in order to participate more easily in activities outside of the home such as adapted golf.   Baseline: 0.77 m/s Goal status: INITIAL  5. Patient will improve BBS score to >/= 38/56 to demonstrate improved balance Baseline: 29/56 Goal status: INITIAL  LONG TERM GOALS: Target date: 10/24/2022  Patient will report demonstrate independence with final HEP in order to maintain current gains and continue to progress after physical therapy discharge.   Baseline: To be provided Goal status: INITIAL  2.   Patient will improve their 5x Sit to Stand score to 15 seconds to demonstrate a decreased risk for falls and improved LE strength.   Baseline: 22.45" without UE support/unsteady WBOS Goal status: INITIAL  3.  Patient will improve TUG score to 15 seconds or less with LRAD to indicate a decreased risk of falls and demonstrate improved overall mobility.  Baseline: 21.27" with RW Goal status: INITIAL  4.  Patient will improve gait speed to 1.0 m/s to indicate improvement to the level of community ambulator in order to participate more easily in activities outside of the home such as adapted golf.   Baseline: 0.77 m/s Goal status: INITIAL  5.  Patient will improve FOTO score to 57 to achieve predicted improvements in functional mobility due to skilled physical therapy interventions to increase safety with and participation in daily activities. Baseline: 49 Goal status: INITIAL  6.  Patient will improve BBS score to >/= 47/56 to demonstrate improved balance Baseline: 29/56 Goal status: INITIAL  ASSESSMENT:  CLINICAL IMPRESSION: Added static balance activities to patient HEP  today to progress balance strategies particularly of the ankle.  Pt still maintains rigid posture during dynamic mobility, but progression during activities remains conservative so as to not worsen bilateral shoulder pain.  Will plan to progress to dynamic trunk and shoulder mobility to progress balance strategies and improve general mobility in coming sessions as able.  Pt is overall tolerating interventions well and demonstrates improved motion sensitivity this visit during turning and forward bending tasks.  Will progress with POC.  OBJECTIVE IMPAIRMENTS: Abnormal gait, cardiopulmonary status limiting activity, decreased activity tolerance, decreased balance, decreased cognition, decreased coordination, decreased endurance, decreased knowledge of condition, decreased mobility, difficulty walking, decreased ROM, decreased strength, and impaired tone.   ACTIVITY LIMITATIONS: carrying, lifting, bending, sitting, standing, squatting, stairs, transfers, reach over head, locomotion level, and caring for others  PARTICIPATION LIMITATIONS: cleaning, driving, shopping, community activity, occupation, and yard work, Systems analyst  PERSONAL FACTORS: Past/current experiences and 3+ comorbidities: see above  are also affecting patient's functional outcome, reliance on others for transportation   REHAB POTENTIAL: Good - may be limited by complex medical history  CLINICAL DECISION MAKING: Evolving/moderate complexity  EVALUATION COMPLEXITY: Moderate  PLAN:  PT FREQUENCY: 2x/week  PT DURATION: 8 weeks  PLANNED INTERVENTIONS: Therapeutic exercises, Therapeutic activity, Neuromuscular re-education, Balance training, Gait training, Patient/Family education, Self Care, Joint mobilization, Stair training, Vestibular training, DME instructions, Wheelchair mobility training, Manual therapy, and Re-evaluation  PLAN FOR NEXT SESSION: monitor vitals/orthostatic hypotension, add to HEP for balance prn, high intensity gait  training, integrate golfing concepts as able, add in simple dual task to gait with SPC, vision exercises, dynamic balance  Sadie Haber, PT, DPT 09/22/2022, 10:17 AM

## 2022-09-24 DIAGNOSIS — I639 Cerebral infarction, unspecified: Secondary | ICD-10-CM | POA: Diagnosis not present

## 2022-09-25 ENCOUNTER — Ambulatory Visit: Payer: BC Managed Care – PPO

## 2022-09-25 ENCOUNTER — Ambulatory Visit: Payer: BC Managed Care – PPO | Admitting: Physical Therapy

## 2022-09-25 ENCOUNTER — Encounter: Payer: Self-pay | Admitting: Physical Therapy

## 2022-09-25 ENCOUNTER — Ambulatory Visit: Payer: BC Managed Care – PPO | Admitting: Speech Pathology

## 2022-09-25 VITALS — BP 140/86 | HR 81

## 2022-09-25 DIAGNOSIS — R2681 Unsteadiness on feet: Secondary | ICD-10-CM

## 2022-09-25 DIAGNOSIS — R262 Difficulty in walking, not elsewhere classified: Secondary | ICD-10-CM | POA: Diagnosis not present

## 2022-09-25 DIAGNOSIS — R41840 Attention and concentration deficit: Secondary | ICD-10-CM | POA: Diagnosis not present

## 2022-09-25 DIAGNOSIS — R42 Dizziness and giddiness: Secondary | ICD-10-CM | POA: Diagnosis not present

## 2022-09-25 DIAGNOSIS — R471 Dysarthria and anarthria: Secondary | ICD-10-CM | POA: Diagnosis not present

## 2022-09-25 DIAGNOSIS — R41842 Visuospatial deficit: Secondary | ICD-10-CM | POA: Diagnosis not present

## 2022-09-25 DIAGNOSIS — M6281 Muscle weakness (generalized): Secondary | ICD-10-CM

## 2022-09-25 DIAGNOSIS — R278 Other lack of coordination: Secondary | ICD-10-CM

## 2022-09-25 DIAGNOSIS — R269 Unspecified abnormalities of gait and mobility: Secondary | ICD-10-CM

## 2022-09-25 DIAGNOSIS — R1310 Dysphagia, unspecified: Secondary | ICD-10-CM | POA: Diagnosis not present

## 2022-09-25 NOTE — Therapy (Signed)
OUTPATIENT SPEECH LANGUAGE PATHOLOGY TREATMENT NOTE   Patient Name: Christopher Yu MRN: [id number] DOB:02/27/1961, 62 y.o., male Today's Date: 09/25/2022  PCP: Thana Ates, MD REFERRING PROVIDER: Charlton Amor, PA-C   END OF SESSION:   End of Session - 09/25/22 0844     Visit Number 7    Number of Visits 17    Date for SLP Re-Evaluation 10/24/22    Authorization Type [employer]    SLP Start Time 0844    SLP Stop Time  0929    SLP Time Calculation (min) 45 min               Past Medical History:  Diagnosis Date   Anxiety    Arthritis, rheumatoid (HCC)    dx 1988   CAD (coronary artery disease), native coronary artery    Mild LAD disease with calcification noted at cath 12//27/16    Cardiomyopathy (HCC)    Cardiomyopathy (HCC)    Chronic systolic heart failure (HCC) 05/01/2015   Depression    Deviated nasal septum 06/25/2011   Headache    High cholesterol    Hyperlipidemia    Impingement syndrome of left shoulder 12/22/2017   Macular degeneration    Nausea and vomiting 08/28/2022   Rheumatoid aortitis    Sleep apnea    does not use cpap - UNABLE TO TOLERATE MASK   Sleep apnea in adult    deviated septum repaired, most recent sleep study was negative   Status post total right knee replacement 06/01/2015   Past Surgical History:  Procedure Laterality Date   ANKLE FUSION Right 05/05/2012   related to his arthritis   ANKLE FUSION Left 05/25/2013   related to his arthritis   ANKLE SURGERY Bilateral    APPLICATION OF CRANIAL NAVIGATION N/A 04/11/2022   Procedure: APPLICATION OF CRANIAL NAVIGATION;  Surgeon: Lisbeth Renshaw, MD;  Location: [facility];  Service: Neurosurgery;  Laterality: N/A;   APPLICATION OF CRANIAL NAVIGATION N/A 08/08/2022   Procedure: APPLICATION OF CRANIAL NAVIGATION;  Surgeon: Lisbeth Renshaw, MD;  Location: [facility];  Service: Neurosurgery;  Laterality: N/A;   CARDIAC CATHETERIZATION N/A 05/01/2015   Procedure: Left Heart Cath  and Coronary Angiography;  Surgeon: Lyn Records, MD;  Location: [facility] INVASIVE CV LAB;  Service: Cardiovascular;  Laterality: N/A;   CRANIOTOMY N/A 04/11/2022   Procedure: STEREOTACTIC SUBOCCIPITAL CRANIOTOMY FOR RESECTION OF ARTERIO-VENOUS MALFORMATION;  Surgeon: Lisbeth Renshaw, MD;  Location: [facility];  Service: Neurosurgery;  Laterality: N/A;   FRACTURE SURGERY     left femur fracture x 3   IR ANGIO EXTERNAL CAROTID SEL EXT CAROTID BILAT MOD SED  01/28/2022   IR ANGIO INTRA EXTRACRAN SEL INTERNAL CAROTID BILAT MOD SED  01/28/2022   IR ANGIO INTRA EXTRACRAN SEL INTERNAL CAROTID BILAT MOD SED  04/16/2022   IR ANGIO VERTEBRAL SEL VERTEBRAL BILAT MOD SED  01/28/2022   IR ANGIO VERTEBRAL SEL VERTEBRAL BILAT MOD SED  04/16/2022   KNEE ARTHROSCOPY     right x 4   LAPAROSCOPIC REVISION VENTRICULAR-PERITONEAL (V-P) SHUNT N/A 08/08/2022   Procedure: LAPAROSCOPIC VENTRICULAR-PERITONEAL (V-P) SHUNT;  Surgeon: Harriette Bouillon, MD;  Location: [facility];  Service: General;  Laterality: N/A;   LUMBAR LAMINECTOMY/DECOMPRESSION MICRODISCECTOMY N/A 07/18/2022   Procedure: Repair of Pseudomeningiocele posterior;  Surgeon: Lisbeth Renshaw, MD;  Location: [facility];  Service: Neurosurgery;  Laterality: N/A;   NASAL SEPTOPLASTY W/ TURBINOPLASTY  06/25/2011   Procedure: NASAL SEPTOPLASTY WITH TURBINATE REDUCTION;  Surgeon: Osborn Coho, MD;  Location:  [facility] OR;  Service: ENT;  Laterality: Bilateral;   PLACEMENT OF LUMBAR DRAIN N/A 07/18/2022   Procedure: PLACEMENT OF LUMBAR DRAIN;  Surgeon: Lisbeth Renshaw, MD;  Location: [facility];  Service: Neurosurgery;  Laterality: N/A;   TONSILLECTOMY     TOTAL KNEE ARTHROPLASTY Right 06/01/2015   Procedure: RIGHT TOTAL KNEE ARTHROPLASTY;  Surgeon: Kathryne Hitch, MD;  Location: [facility];  Service: Orthopedics;  Laterality: Right;  Block+general   VENTRICULOPERITONEAL SHUNT Right 08/08/2022   Procedure: LAP ASSITED SHUNT INSERTION VENTRICULAR-PERITONEAL;  Surgeon: Lisbeth Renshaw, MD;  Location: [facility];  Service: Neurosurgery;  Laterality: Right;   Patient Active Problem List   Diagnosis Date Noted   Nausea and vomiting 08/28/2022   Adjustment disorder 08/06/2022   Protein-calorie malnutrition, severe 07/31/2022   Cerebellar stroke (HCC) 07/29/2022   Ischemic cerebrovascular accident (CVA) (HCC) 07/28/2022   Prolonged QT interval 07/28/2022   Pseudomeningocele 07/18/2022   Pseudomeningocele due to surgical procedure 07/18/2022   Status post craniotomy 04/11/2022   AVM (arteriovenous malformation) brain 04/11/2022   Impingement syndrome of left shoulder 12/22/2017   Impingement syndrome of right shoulder 12/22/2017   Rheumatoid arthritis involving right knee (HCC) 06/01/2015   Status post total right knee replacement 06/01/2015   Cardiomyopathy (HCC) 05/02/2015   Hyperlipidemia    Sleep apnea in adult    Rheumatoid arthritis (HCC)    Chronic systolic heart failure (HCC) 05/01/2015   CAD (coronary artery disease), native coronary artery     ONSET DATE: 07/28/22  REFERRING DIAG:  I63.9 (ICD-10-CM) - Cerebral infarction, unspecified   THERAPY DIAG:  Dysarthria and anarthria  Rationale for Evaluation and Treatment: Rehabilitation  SUBJECTIVE:   SUBJECTIVE STATEMENT: "I think it's been good."   PAIN:  Are you having pain? Yes NPRS scale: 8/10 Pain location: right shoulder Pain orientation: Right  PAIN TYPE: aching Pain description: constant  Aggravating factors: movement Relieving factors: rest   OBJECTIVE:   TODAY'S TREATMENT:                                                                                                                                         DATE:   09/25/22: Led through cognitive exercises to state similarities and differences between items with min-A. Patient carried over slow rate of speech from structured task to conversational speech through telling of a story of friends. Speech clear, with min-cuing. Pt states  that speech sounds clear when asked to self monitor. Unable to do HEP sent home 5/20 due to graduation. States that graduation required lots of talking and that he feels like it went well. SLP noted voice quality to be wet and groggy, that did not diminish with throat clear and water, however this did not decrease intelligibility.    5/20/24[name] with limited completion of HEP at home as they had company from out of town. He completed HEP using slow rate, over articulation  repeating multi-syllabic words and generating sentence with the word with occasional min verbal cues and modeling. Speech today more slurred than last session - suspect fatigue due to company this weekend. In structured task generating 3 sentence salient description of vegetables, Cardarius carried over slow rate and over articulation requiring 2 verbal cues to ID unintelligible words (out of 15 sentences) and to repeat intelligibly. In repetition task (due to reduced vision) Ryott stated 4 words and Id'd the item that did not belong and explained way using intelligible speech 10/10x. In conversation re: grilling/cooking, he maintained intelligible speech in this quiet environment, however as noted before, speech with increased imprecision and slur today. Sequencing homework provided - family will need to read this to him  09/18/22: Pt reports ongoing daily completion of effortful swallow to address dysphagia. Subjective improvement of swallow function per pt report with reduced instances of coughing with liquids and no longer using thickened liquids. In structured practice targeting tri-syllabic words at generative sentence level, pt with 100% accuracy. Practice naturally occurring in conversational speech results in ~70% accuracy of 3+ syllable words with increased challenges endorsed by pt. Barrier to communication at home is pt resistant to repeating himself. Education provided regarding increased complexity of using strategies in  conversation to encourage pt to give himself grace when breakdowns occur. Add oral reading to HEP, if able d/t vision concerns related to dry eyes.   09/15/22: Reviewed HEP for dysphagia - Kegan demonstrated tongue press , 5 second hold with hard swallow 7/7x with rare min A. He is not completing chin tuck against resistance or Shaker's due to pain at incision site. He is completing tongue press hard swallow regularly during the day. [name] completed HEP for dysarthria with rare min A carrying over compensations and over articulation. In structured task repeating and generating sentences with multi-syllabic words, Miriam generated 10/12 sentences intelligibly - requiring repetition on 2/10. Sentences vague and tangential at times, for example, the word representative he stated "A representative is coming to my house on Friday" Questioning and verbal cues revealed that his family from [place] are coming to visit on Friday. [place] also endorses difficulty processing what he wants to say. Targeted processing and carryover of dysarthria strategies generating 3 sentence description of basic words Stoddard gave me the clues "my daughters don't take this to school" or "this is what I want to eat" He required ongoing verbal A to generate salient descriptions for his listener, me who doesn't know him personally. Provided home work for this task as well. He plans on attending an award ceremony and graduation for his daughter. Education provided re: strategies and awareness of sensory overload as Pieter reports he frustrates easily and generated strategy of yelling at the person who " was annoying me" - Verbal cue and education to use strategy of telling [name] he may need to leave or go to a more quiet spot.  09/10/22: Initiated HEP for dysarthria - see patient instructions - Initiated training in compensations for dysarthria (slow, loud. Over articulation and pause) With cues to open mouth wide, make each sound distinct, Jedi  completed sentences intelligibly 18/20.  Using compensations in structured task, generating 3 sentence description of basic object, Carlyle carried over strategies to be intelligible 10/12 sentences with consistent modeling and occasional min verbal cues. Repetition required 2/12 sentences with mod I at correcting errors to intelligible speech. In simple conversation he maintained intelligible speech with rare min A over 4 minutes. Patrick required verbal cue to look right  with 2 swallows when he took tylenol  08/29/22: updated HEP. Direct instruction for swallow exercises with pt able to confirm understanding via demonstration with min-A.      PATIENT EDUCATION: Education details: POC, goals, evaluation results/recommendations Person educated: Patient and Spouse Education method: Explanation, Demonstration, Verbal cues, and Handouts Education comprehension: verbalized understanding, returned demonstration, and needs further education     GOALS: Goals reviewed with patient? Yes   SHORT TERM GOALS: Target date: 10/03/2022   Pt will teach back dysarthria strategies and compensations with mod-I Baseline: Goal status: ONGOING   2.   Pt will employ dysarthria strategies during structured practice activities resulting in intelligible production at mult-sentence level in 80% of trials Baseline:  Goal status: ONGOING   3. Pt will demonstrate use of swallow compensations/strategies with PO trials with mod-I over 2 sessions  Baseline:  Goal status: ONGOING   4.  Pt will complete HEP 6/7 days over 1 week period with mod-I Baseline:  Goal status: ONGOING     LONG TERM GOALS: Target date: 10/24/2022   Pt will complete repeat MBSS Baseline:  Goal status: ONGOING   2.  Pt will carryover dysarthria strategies/compensations during 20 minute conversation resulting in 90% ineligibility  Baseline:  Goal status: ONGOING   3.  Pt and spouse will report subjective report of reduced need for requests  for repetition by 50% at home, over 1 week period Baseline:  Goal status:ONGOING   4.  Pt will report improvement via PROM by dc  Baseline:  Goal status: ONGOING     ASSESSMENT:   CLINICAL IMPRESSION: Patient is a 62 y.o. M who was seen today for dysphagia and motor speech eval s/p cerebellar stroke. Evaluation reveals moderate dysarthria and recent MBSS demonstrates mild pharyngeal dysphagia. Pt's speech is c/b irregular imprecise articulation and distortions, volume decay on expanded utterances, and reduced use of prosody. Pt reports frustration regarding his communicative abilities, tells SLP his vocation has high level of demands for communication and he feels he will "never get back there, it's impossible." As evaluation progressed, pt with increased frustration with SLP providing education to encourage participation and positive mindset for engagement in therapy course. Brief introduction to dysarthria strategies initiated. Reviewed instrumental swallow study results and updated pt's dysphagia HEP to accommodate preferences while targeting demonstrated deficits. Will plan to request repeat MBSS following usual completion of dysphagia HEP over 1-2 month period. Pt would benefit from skilled ST to address aforementioned deficits to improve QoL, enhance communication efficacy, and facilitate improved swallow function/safety .      OBJECTIVE IMPAIRMENTS: include dysarthria and dysphagia. These impairments are limiting patient from return to work, effectively communicating at home and in community, and safety when swallowing. Factors affecting potential to achieve goals and functional outcome are cooperation/participation level. Patient will benefit from skilled SLP services to address above impairments and improve overall function.   REHAB POTENTIAL: Good   PLAN:   SLP FREQUENCY: 2x/week   SLP DURATION: 8 weeks   PLANNED INTERVENTIONS: Aspiration precaution training, Pharyngeal  strengthening exercises, Diet toleration management , Trials of upgraded texture/liquids, Cueing hierachy, Internal/external aids, Functional tasks, Multimodal communication approach, SLP instruction and feedback, Compensatory strategies, Patient/family education, and Re-evaluation     [place], Student-SLP 09/25/2022, 8:44 AM

## 2022-09-25 NOTE — Therapy (Signed)
OUTPATIENT PHYSICAL THERAPY NEURO TREATMENT   Patient Name: Christopher Yu MRN: [id number] DOB:18-Jan-1961, 62 y.o., male Today's Date: 09/25/2022  PCP: Thana Ates, MD REFERRING PROVIDER: Charlton Amor, PA-C  END OF SESSION:  PT End of Session - 09/25/22 0930     Visit Number 9    Number of Visits 17    Date for PT Re-Evaluation 11/07/22    Authorization Type [employer]    PT Start Time 0930    PT Stop Time 1017    PT Time Calculation (min) 47 min    Equipment Utilized During Treatment Gait belt    Activity Tolerance Patient tolerated treatment well   RA flare in bilateral shoulders   Behavior During Therapy WFL for tasks assessed/performed;Flat affect             Past Medical History:  Diagnosis Date   Anxiety    Arthritis, rheumatoid (HCC)    dx 1988   CAD (coronary artery disease), native coronary artery    Mild LAD disease with calcification noted at cath 12//27/16    Cardiomyopathy (HCC)    Cardiomyopathy (HCC)    Chronic systolic heart failure (HCC) 05/01/2015   Depression    Deviated nasal septum 06/25/2011   Headache    High cholesterol    Hyperlipidemia    Impingement syndrome of left shoulder 12/22/2017   Macular degeneration    Nausea and vomiting 08/28/2022   Rheumatoid aortitis    Sleep apnea    does not use cpap - UNABLE TO TOLERATE MASK   Sleep apnea in adult    deviated septum repaired, most recent sleep study was negative   Status post total right knee replacement 06/01/2015   Past Surgical History:  Procedure Laterality Date   ANKLE FUSION Right 05/05/2012   related to his arthritis   ANKLE FUSION Left 05/25/2013   related to his arthritis   ANKLE SURGERY Bilateral    APPLICATION OF CRANIAL NAVIGATION N/A 04/11/2022   Procedure: APPLICATION OF CRANIAL NAVIGATION;  Surgeon: Lisbeth Renshaw, MD;  Location: [facility] OR;  Service: Neurosurgery;  Laterality: N/A;   APPLICATION OF CRANIAL NAVIGATION N/A 08/08/2022   Procedure:  APPLICATION OF CRANIAL NAVIGATION;  Surgeon: Lisbeth Renshaw, MD;  Location: [facility] OR;  Service: Neurosurgery;  Laterality: N/A;   CARDIAC CATHETERIZATION N/A 05/01/2015   Procedure: Left Heart Cath and Coronary Angiography;  Surgeon: Lyn Records, MD;  Location: [facility] INVASIVE CV LAB;  Service: Cardiovascular;  Laterality: N/A;   CRANIOTOMY N/A 04/11/2022   Procedure: STEREOTACTIC SUBOCCIPITAL CRANIOTOMY FOR RESECTION OF ARTERIO-VENOUS MALFORMATION;  Surgeon: Lisbeth Renshaw, MD;  Location: [facility] OR;  Service: Neurosurgery;  Laterality: N/A;   FRACTURE SURGERY     left femur fracture x 3   IR ANGIO EXTERNAL CAROTID SEL EXT CAROTID BILAT MOD SED  01/28/2022   IR ANGIO INTRA EXTRACRAN SEL INTERNAL CAROTID BILAT MOD SED  01/28/2022   IR ANGIO INTRA EXTRACRAN SEL INTERNAL CAROTID BILAT MOD SED  04/16/2022   IR ANGIO VERTEBRAL SEL VERTEBRAL BILAT MOD SED  01/28/2022   IR ANGIO VERTEBRAL SEL VERTEBRAL BILAT MOD SED  04/16/2022   KNEE ARTHROSCOPY     right x 4   LAPAROSCOPIC REVISION VENTRICULAR-PERITONEAL (V-P) SHUNT N/A 08/08/2022   Procedure: LAPAROSCOPIC VENTRICULAR-PERITONEAL (V-P) SHUNT;  Surgeon: Harriette Bouillon, MD;  Location: [facility] OR;  Service: General;  Laterality: N/A;   LUMBAR LAMINECTOMY/DECOMPRESSION MICRODISCECTOMY N/A 07/18/2022   Procedure: Repair of Pseudomeningiocele posterior;  Surgeon: Lisbeth Renshaw, MD;  Location: [facility]  OR;  Service: Neurosurgery;  Laterality: N/A;   NASAL SEPTOPLASTY W/ TURBINOPLASTY  06/25/2011   Procedure: NASAL SEPTOPLASTY WITH TURBINATE REDUCTION;  Surgeon: Osborn Coho, MD;  Location: [facility] OR;  Service: ENT;  Laterality: Bilateral;   PLACEMENT OF LUMBAR DRAIN N/A 07/18/2022   Procedure: PLACEMENT OF LUMBAR DRAIN;  Surgeon: Lisbeth Renshaw, MD;  Location: [facility] OR;  Service: Neurosurgery;  Laterality: N/A;   TONSILLECTOMY     TOTAL KNEE ARTHROPLASTY Right 06/01/2015   Procedure: RIGHT TOTAL KNEE ARTHROPLASTY;  Surgeon: Kathryne Hitch, MD;  Location: [facility]  ORS;  Service: Orthopedics;  Laterality: Right;  Block+general   VENTRICULOPERITONEAL SHUNT Right 08/08/2022   Procedure: LAP ASSITED SHUNT INSERTION VENTRICULAR-PERITONEAL;  Surgeon: Lisbeth Renshaw, MD;  Location: [facility] OR;  Service: Neurosurgery;  Laterality: Right;   Patient Active Problem List   Diagnosis Date Noted   Nausea and vomiting 08/28/2022   Adjustment disorder 08/06/2022   Protein-calorie malnutrition, severe 07/31/2022   Cerebellar stroke (HCC) 07/29/2022   Ischemic cerebrovascular accident (CVA) (HCC) 07/28/2022   Prolonged QT interval 07/28/2022   Pseudomeningocele 07/18/2022   Pseudomeningocele due to surgical procedure 07/18/2022   Status post craniotomy 04/11/2022   AVM (arteriovenous malformation) brain 04/11/2022   Impingement syndrome of left shoulder 12/22/2017   Impingement syndrome of right shoulder 12/22/2017   Rheumatoid arthritis involving right knee (HCC) 06/01/2015   Status post total right knee replacement 06/01/2015   Cardiomyopathy (HCC) 05/02/2015   Hyperlipidemia    Sleep apnea in adult    Rheumatoid arthritis (HCC)    Chronic systolic heart failure (HCC) 05/01/2015   CAD (coronary artery disease), native coronary artery     ONSET DATE: 08/21/2022 (referral date)  REFERRING DIAG: I63.9 (ICD-10-CM) - Cerebral infarction, unspecified  THERAPY DIAG:  Muscle weakness (generalized)  Other lack of coordination  Unsteadiness on feet  Abnormality of gait and mobility  Rationale for Evaluation and Treatment: Rehabilitation  SUBJECTIVE:                                                                                                                                                                                             SUBJECTIVE STATEMENT: Patient received from ST seated on mat.  He brought his SPC.  He denies falls or near misses.    Pt accompanied by: self and significant other - wife Christopher Yu  PERTINENT HISTORY: cerebellar AVM  resection 12/23, pseudomeningocele s/p craniotomy 3/24, acute R cerebellar infarct, hydrocephalus and VP shunt was placed on 08/08/22, dysphagia, dysarthria, aphasia  PMH: rheumatoid arthritis, bilateral ankle fusion (2014 and 2015), cardiomyopathy, CAD, R TKA, cardiomyopathy, chronic systolic heart failure, macular  degeneration, OSA on CPAP  PAIN:  Are you having pain? No  PRECAUTIONS: Fall and Other: swallow precautions with modification with left head tilt and supervision if drinking thinned liquids, per caregiver not to lift, history of orthostatic hypotension resulting in falls  PATIENT GOALS: "To get back to playing golf and moving like I used to."  OBJECTIVE:   DIAGNOSTIC FINDINGS:   CT on 08/27/2022: IMPRESSION: 1. No evidence of acute infarct or hemorrhage. 2. Stable right-sided ventriculostomy catheter, tip within the right lateral ventricle. Stable size of the lateral ventricles without hydrocephalus. 3. Postsurgical changes from prior suboccipital craniectomy, with underlying encephalomalacia in the cerebellum. The pseudomeningocele seen on prior study has increased slightly in size since prior exam, of uncertain significance. 4. Decreased size of the right superior cerebellar intraparenchymal cyst, with decreased mass effect upon the fourth ventricle.  MR Brain on 07/28/2022: IMPRESSION: 1. New small focus of acute infarction in the right superior cerebellar hemisphere with surrounding area of increased T2 signal and cystic change, which is new since the prior MRI dated 07/05/2022 and suspicious for now chronic infarct. 2. Postoperative changes of prior left posterior fossa resection. Redemonstrated foci of gas within the resection cavity, fourth ventricle and extra-axial spaces, likely secondary to recent pseudomeningocele repair and/or lumbar drain placement/removal. 3. Decreased size of the fluid collection in the dorsal soft tissues overlying the suboccipital  craniectomy.  TODAY'S TREATMENT:                                                                                                                              -Discussed reduced frequency to preserve visits for remainder of year w/ pt wanting to reduce to 1x/wk.  Discussed visits to remove and PT canceled for pt.  Provided printed updated calendar to wife. -Assessed vitals: Today's Vitals   09/25/22 0945  BP: (!) 140/86  Pulse: 81  SpO2: 95%   -Verbally reviewed components of HEP, pt has no consistent routine, but states he has had no safety concerns with exercises.  They do not need addition printouts.  Pt states he is doing them some. -5xSTS w/o UE support x1 LOB forward w/ effective stepping strategy 26.25 seconds; repeated w/o UE support:  19.27 seconds, pt is steady -TUG w/ SPC:  12.97 seconds - w/ SPC and SBA:  10.57 seconds = 0.95 m/sec OR 3.12 ft/sec -BERG:  [facility] PT Assessment - 09/25/22 1002       Berg Balance Test   Sit to Stand Able to stand  independently using hands    Standing Unsupported Able to stand safely 2 minutes    Sitting with Back Unsupported but Feet Supported on Floor or Stool Able to sit safely and securely 2 minutes    Stand to Sit Sits safely with minimal use of hands    Transfers Able to transfer safely, definite need of hands    Standing Unsupported with Eyes Closed Able to stand 10 seconds with supervision  Standing Unsupported with Feet Together Able to place feet together independently and stand for 1 minute with supervision    From Standing, Reach Forward with Outstretched Arm Can reach forward >12 cm safely (5")    From Standing Position, Pick up Object from Floor Able to pick up shoe, needs supervision    From Standing Position, Turn to Look Behind Over each Shoulder Looks behind from both sides and weight shifts well    Turn 360 Degrees Able to turn 360 degrees safely but slowly    Standing Unsupported, Alternately Place Feet on Step/Stool  Able to complete >2 steps/needs minimal assist    Standing Unsupported, One Foot in Front Able to take small step independently and hold 30 seconds    Standing on One Leg Tries to lift leg/unable to hold 3 seconds but remains standing independently    Total Score 40    Berg comment: 40/56 = Significant fall risk, cusp for cane users            PATIENT EDUCATION: Education details:  Progress towards goals.  Discussed long term (I.e. >30month) goal of getting away from cane, wanting pt to walk more consistently working on mechanics like arm swing and fluidity of trunk and LE movement an practicing more ankle strategy. Person educated: Patient and Spouse Education method: Medical illustrator Education comprehension: verbalized understanding and needs further education  HOME EXERCISE PROGRAM: You Can Walk For A Certain Length Of Time Each Day                          Walk 2 minutes 1 times per day.             Increase 2  minutes every 3 days              Work up to 30 minutes (1-2 times per day).               Example:                         Day 1-2           4-5 minutes     3 times per day                         Day 7-8           10-12 minutes 2-3 times per day                         Day 13-14       20-22 minutes 1-2 times per day  Oculomotor: Saccades    Holding two targets positioned side by side __6__ inches apart, move eyes quickly from target to target as head stays still.  Perform sitting. Repeat __10__ times per session. Do __3__ sessions per day.  Access Code: [email or web address] URL: [email or web address] Date: 09/12/2022 Prepared by: Camille Bal  Exercises - Pencil Pushups  - 1 x daily - 5 x weekly - 3 sets - 5 reps - Corner Balance Feet Together With Eyes Closed  - 1 x daily - 5 x weekly - 1 sets - 3-4 reps - 30-45 seconds hold - Standing Tandem Balance with Counter Support  - 1 x daily - 5 x weekly - 1 sets - 2-3 reps - 45 seconds hold -  Tandem Walking with Counter Support  -  1 x daily - 5 x weekly - 3 sets - 10 reps - Backward Walking with Counter Support  - 1 x daily - 5 x weekly - 3 sets - 10 reps - Walking with Head Rotation  - 1 x daily - 5 x weekly - 3 sets - 10 reps  GOALS: Goals reviewed with patient? Yes  SHORT TERM GOALS: Target date: 09/26/2022  Patient will demonstrate 100% compliance with initial HEP to continue to progress between physical therapy sessions.   Baseline: Pt is intermittently compliant (5/23) Goal status: IN PROGRESS  2.  Patient will improve their 5x Sit to Stand score to less than 18 seconds to demonstrate a decreased risk for falls and improved LE strength.   Baseline: 22.45" without UE support/unsteady WBOS; 19.27 seconds, pt is steady (5/23) Goal status: IN PROGRESS  3.  Patient will improve TUG score to 17 seconds or less with LRAD to indicate a decreased risk of falls and demonstrate improved overall mobility.  Baseline: 21.27" with RW; 12.97 seconds w/ SPC (5/23) Goal status: MET  4.  Patient will improve gait speed to 0.87 m/s to indicate improvement to the level of community ambulator in order to participate more easily in activities outside of the home such as adapted golf.   Baseline: 0.77 m/s; 0.95 m/sec (5/23) Goal status: MET  5. Patient will improve BBS score to >/= 38/56 to demonstrate improved balance Baseline: 29/56; 40/56 (5/23) Goal status: MET  LONG TERM GOALS: Target date: 10/24/2022  Patient will report demonstrate independence with final HEP in order to maintain current gains and continue to progress after physical therapy discharge.   Baseline: To be provided Goal status: INITIAL  2.   Patient will improve their 5x Sit to Stand score to 15 seconds to demonstrate a decreased risk for falls and improved LE strength.   Baseline: 22.45" without UE support/unsteady WBOS Goal status: INITIAL  3.  Patient will improve TUG score to 15 seconds or less with LRAD to  indicate a decreased risk of falls and demonstrate improved overall mobility.  Baseline: 21.27" with RW; 12.97 seconds w/ SPC (5/23) Goal status: REVISED-MET 5/23  4.  Patient will improve gait speed to 1.0 m/s to indicate improvement to the level of community ambulator in order to participate more easily in activities outside of the home such as adapted golf.   Baseline: 0.77 m/s Goal status: INITIAL  5.  Patient will improve FOTO score to 57 to achieve predicted improvements in functional mobility due to skilled physical therapy interventions to increase safety with and participation in daily activities. Baseline: 49 Goal status: INITIAL  6.  Patient will improve BBS score to >/= 47/56 to demonstrate improved balance Baseline: 29/56 Goal status: INITIAL  ASSESSMENT:  CLINICAL IMPRESSION: Assessed STGs this visit with patient making great progress towards all goals.  He elected to reduce his frequency for PT both based on perceived progress and desire to preserve visits for a later point in the year for needs as they arise.  He is intermittently compliant to a recently updated HEP focusing on dynamic balance, functional strength, and visual exercise.  He requires 2 trials of the 5xSTS, but is able to progress close to goal level with improved steadiness in immediate standing.  His TUG time of 12.97 seconds using SPC indicates a lowered fall risk and correlates to improvement noted in his gait speed of 0.95 m/sec today.  The BERG balance assessment showed notable increase in his static stance  w/ an improvement in score from 29 to 40 of 56.  Will continue per POC as pt is making adequate progress.  OBJECTIVE IMPAIRMENTS: Abnormal gait, cardiopulmonary status limiting activity, decreased activity tolerance, decreased balance, decreased cognition, decreased coordination, decreased endurance, decreased knowledge of condition, decreased mobility, difficulty walking, decreased ROM, decreased strength,  and impaired tone.   ACTIVITY LIMITATIONS: carrying, lifting, bending, sitting, standing, squatting, stairs, transfers, reach over head, locomotion level, and caring for others  PARTICIPATION LIMITATIONS: cleaning, driving, shopping, community activity, occupation, and yard work, Systems analyst  PERSONAL FACTORS: Past/current experiences and 3+ comorbidities: see above  are also affecting patient's functional outcome, reliance on others for transportation   REHAB POTENTIAL: Good - may be limited by complex medical history  CLINICAL DECISION MAKING: Evolving/moderate complexity  EVALUATION COMPLEXITY: Moderate  PLAN:  PT FREQUENCY: 2x/week (dropped to 1x/wk on 5/23 per pt request)  PT DURATION: 8 weeks  PLANNED INTERVENTIONS: Therapeutic exercises, Therapeutic activity, Neuromuscular re-education, Balance training, Gait training, Patient/Family education, Self Care, Joint mobilization, Stair training, Vestibular training, DME instructions, Wheelchair mobility training, Manual therapy, and Re-evaluation  PLAN FOR NEXT SESSION: monitor vitals/orthostatic hypotension, add to HEP for balance prn, high intensity gait training, integrate golfing concepts as able, add in simple dual task to gait with SPC, vision exercises, dynamic balance, trial steps in bars no UE support working on fluidity of movement, coordination tasks/reciprocal mobility/arm swing  Sadie Haber, PT, DPT 09/25/2022, 10:20 AM

## 2022-09-25 NOTE — Therapy (Signed)
OUTPATIENT OCCUPATIONAL THERAPY NEURO TREATMENT  Patient Name: Christopher Yu MRN: [id number] DOB:[birth year], 62 y.o., male Today's Date: 09/25/2022  PCP: Thana Ates, MD REFERRING PROVIDER: Charlton Amor, PA-C  END OF SESSION:  OT End of Session - 09/25/22 1012     Visit Number 8    Number of Visits 20    Date for OT Re-Evaluation 06/20/22    Authorization Type [employer]    OT Start Time 1018    OT Stop Time 1100    OT Time Calculation (min) 42 min    Activity Tolerance Patient tolerated treatment well;Patient limited by fatigue    Behavior During Therapy [facility] for tasks assessed/performed;Flat affect             Past Medical History:  Diagnosis Date   Anxiety    Arthritis, rheumatoid (HCC)    dx 1988   CAD (coronary artery disease), native coronary artery    Mild LAD disease with calcification noted at cath 12//27/16    Cardiomyopathy (HCC)    Cardiomyopathy (HCC)    Chronic systolic heart failure (HCC) 05/01/2015   Depression    Deviated nasal septum 06/25/2011   Headache    High cholesterol    Hyperlipidemia    Impingement syndrome of left shoulder 12/22/2017   Macular degeneration    Nausea and vomiting 08/28/2022   Rheumatoid aortitis    Sleep apnea    does not use cpap - UNABLE TO TOLERATE MASK   Sleep apnea in adult    deviated septum repaired, most recent sleep study was negative   Status post total right knee replacement 06/01/2015   Past Surgical History:  Procedure Laterality Date   ANKLE FUSION Right 05/05/2012   related to his arthritis   ANKLE FUSION Left 05/25/2013   related to his arthritis   ANKLE SURGERY Bilateral    APPLICATION OF CRANIAL NAVIGATION N/A 04/11/2022   Procedure: APPLICATION OF CRANIAL NAVIGATION;  Surgeon: Lisbeth Renshaw, MD;  Location: [facility] OR;  Service: Neurosurgery;  Laterality: N/A;   APPLICATION OF CRANIAL NAVIGATION N/A 08/08/2022   Procedure: APPLICATION OF CRANIAL NAVIGATION;  Surgeon: Lisbeth Renshaw, MD;  Location: [facility] OR;  Service: Neurosurgery;  Laterality: N/A;   CARDIAC CATHETERIZATION N/A 05/01/2015   Procedure: Left Heart Cath and Coronary Angiography;  Surgeon: Lyn Records, MD;  Location: [facility] INVASIVE CV LAB;  Service: Cardiovascular;  Laterality: N/A;   CRANIOTOMY N/A 04/11/2022   Procedure: STEREOTACTIC SUBOCCIPITAL CRANIOTOMY FOR RESECTION OF ARTERIO-VENOUS MALFORMATION;  Surgeon: Lisbeth Renshaw, MD;  Location: [facility] OR;  Service: Neurosurgery;  Laterality: N/A;   FRACTURE SURGERY     left femur fracture x 3   IR ANGIO EXTERNAL CAROTID SEL EXT CAROTID BILAT MOD SED  01/28/2022   IR ANGIO INTRA EXTRACRAN SEL INTERNAL CAROTID BILAT MOD SED  01/28/2022   IR ANGIO INTRA EXTRACRAN SEL INTERNAL CAROTID BILAT MOD SED  04/16/2022   IR ANGIO VERTEBRAL SEL VERTEBRAL BILAT MOD SED  01/28/2022   IR ANGIO VERTEBRAL SEL VERTEBRAL BILAT MOD SED  04/16/2022   KNEE ARTHROSCOPY     right x 4   LAPAROSCOPIC REVISION VENTRICULAR-PERITONEAL (V-P) SHUNT N/A 08/08/2022   Procedure: LAPAROSCOPIC VENTRICULAR-PERITONEAL (V-P) SHUNT;  Surgeon: Harriette Bouillon, MD;  Location: [facility] OR;  Service: General;  Laterality: N/A;   LUMBAR LAMINECTOMY/DECOMPRESSION MICRODISCECTOMY N/A 07/18/2022   Procedure: Repair of Pseudomeningiocele posterior;  Surgeon: Lisbeth Renshaw, MD;  Location: [facility] OR;  Service: Neurosurgery;  Laterality: N/A;   NASAL SEPTOPLASTY W/ TURBINOPLASTY  06/25/2011   Procedure: NASAL SEPTOPLASTY WITH TURBINATE REDUCTION;  Surgeon: Osborn Coho, MD;  Location: [facility] OR;  Service: ENT;  Laterality: Bilateral;   PLACEMENT OF LUMBAR DRAIN N/A 07/18/2022   Procedure: PLACEMENT OF LUMBAR DRAIN;  Surgeon: Lisbeth Renshaw, MD;  Location: [facility] OR;  Service: Neurosurgery;  Laterality: N/A;   TONSILLECTOMY     TOTAL KNEE ARTHROPLASTY Right 06/01/2015   Procedure: RIGHT TOTAL KNEE ARTHROPLASTY;  Surgeon: Kathryne Hitch, MD;  Location: [facility] ORS;  Service: Orthopedics;  Laterality: Right;   Block+general   VENTRICULOPERITONEAL SHUNT Right 08/08/2022   Procedure: LAP ASSITED SHUNT INSERTION VENTRICULAR-PERITONEAL;  Surgeon: Lisbeth Renshaw, MD;  Location: [facility] OR;  Service: Neurosurgery;  Laterality: Right;   Patient Active Problem List   Diagnosis Date Noted   Nausea and vomiting 08/28/2022   Adjustment disorder 08/06/2022   Protein-calorie malnutrition, severe 07/31/2022   Cerebellar stroke (HCC) 07/29/2022   Ischemic cerebrovascular accident (CVA) (HCC) 07/28/2022   Prolonged QT interval 07/28/2022   Pseudomeningocele 07/18/2022   Pseudomeningocele due to surgical procedure 07/18/2022   Status post craniotomy 04/11/2022   AVM (arteriovenous malformation) brain 04/11/2022   Impingement syndrome of left shoulder 12/22/2017   Impingement syndrome of right shoulder 12/22/2017   Rheumatoid arthritis involving right knee (HCC) 06/01/2015   Status post total right knee replacement 06/01/2015   Cardiomyopathy (HCC) 05/02/2015   Hyperlipidemia    Sleep apnea in adult    Rheumatoid arthritis (HCC)    Chronic systolic heart failure (HCC) 05/01/2015   CAD (coronary artery disease), native coronary artery     ONSET DATE: 08/21/2022 (date of referral)  REFERRING DIAG: I63.9 (ICD-10-CM) - Cerebral infarction, unspecified  THERAPY DIAG:  Muscle weakness (generalized)  Other lack of coordination  Unsteadiness on feet  Visuospatial deficit  Attention and concentration deficit  Rationale for Evaluation and Treatment: Rehabilitation  SUBJECTIVE:   SUBJECTIVE STATEMENT: Pt reports graduation for his daughter went well yesterday.  Pt accompanied by: self and significant other, [name]  PERTINENT HISTORY: CT/MRI showed new small focus of acute infarction in the right superior cerebellar hemisphere. Cerebellar AVM resection 12/23, pseudomeningocele s/p craniotomy 3/24, acute R cerebellar infarct, hydrocephalus and VP shunt was placed on 08/08/22, dysphagia, dysarthria, aphasia.  PMH: rheumatoid arthritis, bilateral ankle fusion (2014 and 2015), cardiomyopathy, CAD, R TKA, cardiomyopathy, chronic systolic heart failure, macular degeneration, OSA on CPAP  PRECAUTIONS: Fall and Other: swallow precautions with modification with left head tilt and supervision if drinking thinned liquids, per caregiver not to lift, history of orthostatic hypotension resulting in falls; no driving  WEIGHT BEARING RESTRICTIONS: No  PAIN:  Are you having pain? No  FALLS: Has patient fallen in last 6 months? Yes. Number of falls 3 - after the first surgery, got dizzy and fell, using the walker, when up and moving, struggled with orthostatic hypotension but medical team has since decreased BP medications  LIVING ENVIRONMENT: Lives with: lives with their spouse, two teenage daughters (also has dtr who lives in [place]), and 175 lb dog Lives in: House/apartment - Able to live on main floor Stairs: Yes: Internal: ~14 steps; on right going up and External: 3 steps; on right going up Has following equipment at home: Dan Humphreys - 2 wheeled and Wheelchair (manual) Bathroom setup: [employer], regular height toilet  PLOF: Prior to December, pt completely independent with ADL/IADLs, ambulatory without device, pt enjoys golfing, driving, 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  PATIENT GOALS: Return to golf, improve vision for work, travel in June for daughter's  outdoor wedding (be off the walker ideally)  OBJECTIVE: (results from evaluation unless otherwise stated)  HAND DOMINANCE: Right  ADLs: Eating: Independent with self-feeding, wife cuts items due to impaired coordination per her report Grooming: Independent mostly, but reports it's painful to brush hair from surgical procedure in December UB Dressing: Supervision, increased time and wife present if needed LB Dressing: Supervision increased time and wife present if needed. Sometimes sits to don pants Toileting: Supervision, more effort, wife there  in case pt loses balance Bathing: Supervision for balance Tub Shower transfers: Supervision likely; however, sponge bath currently due to hand held shower head breaking and plumber coming 5/1.  Equipment:  Toilet riser with armrests , shower chair, bedrail on bed  IADLs: Shopping: Currently unable, previously independent Light housekeeping: Currently unable, previously did laundry Meal Prep: Currently unable, but previously assisted in task Community mobility: and household, pt utilizing rolling walker at all times. Typically sedentary in recliner most of the day. Gets up for bathroom.  Medication management: Wife Presenter, broadcasting: Currently unable, pt typically did before Handwriting: 75% legible and Mild micrographia   MOBILITY STATUS:  Pt ambulatory with RW, wife providing supervision  at all times  POSTURE COMMENTS:  rounded shoulders Sitting balance: Moves/returns truncal midpoint 1-2 inches in multiple planes - Wife describes she believes it is more of a coordination issue, overshooting and then losing balance.   ACTIVITY TOLERANCE: Activity tolerance: Subjective report of significant fatigue  FUNCTIONAL OUTCOME MEASURES: FOTO: 58 score  UPPER EXTREMITY ROM:    Active ROM Right eval Left eval  Shoulder flexion [facility]  Shoulder abduction [facility]  Shoulder adduction [facility] WFL  Shoulder extension [facility]  Shoulder internal rotation    Shoulder external rotation    Elbow flexion WFL WFL  Elbow extension [facility] WFL  Wrist flexion    Wrist extension    Wrist ulnar deviation    Wrist radial deviation    Wrist pronation    Wrist supination    (Blank rows = not tested)  UPPER EXTREMITY MMT:     MMT Right eval Left eval  Shoulder flexion 4+/5 4+/5  Shoulder abduction    Shoulder adduction    Shoulder extension    Shoulder internal rotation    Shoulder external rotation    Middle trapezius    Lower trapezius    Elbow flexion 4+/5 4+/5  Elbow extension  4+/5 4+/5  Wrist flexion    Wrist extension    Wrist ulnar deviation    Wrist radial deviation    Wrist pronation    Wrist supination    (Blank rows = not tested)  HAND FUNCTION: Grip strength: Right: 67.2 lbs; Left: 66.3 lbs and 3 point pinch: Right: 12 lbs, Left: 15 lbs  COORDINATION: 9 Hole Peg test: Right: 31 sec; Left: 59 sec Box and Blocks:  Right 27 blocks, Left 19blocks  SENSATION: WFL  EDEMA: None  MUSCLE TONE: WFL  COGNITION: Overall cognitive status:  Grossly WFL, possible for attention deficits.  VISION: Subjective report: Pt reports some jerky vision since December Baseline vision: Wears glasses all the time Visual history: cataracts and macular degeneration  VISION ASSESSMENT: Impaired Eye alignment: Impaired: Right eye seemed to have amblyopia-type appearance with visual pursuits  Reading acuity: Pt reports difficulty reading instructions at this time, he reports all letters merge together Tracking/Visual pursuits: Right eye appears to be slower than left Saccades: overshoots - right eye from temporal to nasal Convergence: WFL Visual Fields: Grossly intact;  however, decreased peripheral vision to right side when provided with moving target with detection of object at 30* Diplopia assessment: When given macular degeneration Amsler grid, pt reports double vision of right eye when left eye occluded. Quadrants - WFL   Patient has difficulty with following activities due to following visual impairments: Work related tasks  PERCEPTION: WFL  PRAXIS: WFL  OBSERVATIONS: Patient arrives to session ambulating with rolling walker. Patient wife primarily speaks during the session given patient's dysarthria. Per caregiver, patient had a surgery to address AVM and then had pseudomengiocele and underwent 3 surgeries as a result of complications. Per report, patient was D/C from hospital and then returned with symptoms of acute stroke. Prior to December patient was  fully independent, working full time in IT, and golfing regularly every weekend; however, patient is now fully supervised for all ADLs, not working, and now mobilizing either via manual W/C or walker. Patient reports increased nausea disrupting appetite. Family reports going to the ED on last Wednesday due to new reports of nausea and double vision and there was concern for another stroke; however, family decided to the return home when CT came back clear and did not want to go through additional MRI. Per family, inpatient rehab physician aware of this and recommended they call their neurosurgeon for follow up as well and did not advise against continuing outpatient therapy. Family also expresses concern in not knowing how to truly recognize a stroke as patient now has such extensive deficits. Family also reports that a therapist in inpatient rehab noticed unique eye movements and recommend they also see a therapist that could help address vestibular concerns. Wife showing OT video today. Video showed pt performing visual pursuits with jerky eye movement going from left to right sides, right eye impacted.  09/09/22: Pt received from PT lying on mat table. Pt stating taking a rest break. Pt denies pain or dizziness.    TODAY'S TREATMENT:                                                                                                                              DATE:  Review of partial STG this visit with pt meeting coordination and HEP goals currently. In collaboration with pt and wife, clinical decision to reduce frequency to 1x/week for OT visits given pt progress. Will assess additional STG next visit 5/28. Discussion of progress made and review of schedule. Pt then engaging in seated tabletop activity to address visual scanning and processing while incorporating fine motor skills to place small pegs into small peg mat matching visual pattern. Pt requiring increased time for visual processing and fine motor  control to pick up pegs and place in hole. Pt reports looking at picture pattern, and then looking at small peg mat simulating his work with dual computers at home.   PATIENT EDUCATION: Education details: POC frequency change, review of partial STGs, goals for future visits Education provided to: Patient, spouse Delivered  by: Verbal cues Pt receptibe   HOME EXERCISE PROGRAM: 09/02/22: Coordination HEP 09/04/22: Modified coordination HEP (for vision) and vision HEP 09/18/22: No hand out provided but encouraged to work on standing activities throughout the day and combine with table top ball rolling for shoulder ROM. 09/25/22: None  GOALS: Goals reviewed with patient? Yes  SHORT TERM GOALS: Target date: 09/30/2022   Pt will be independent in LUE HEP. Baseline: Initiated 09/25/22: Completing independently Goal status: MET  2.  Pt will demonstrate improved fine motor coordination for ADLs as evidenced by decreasing 9 hole peg test score for left hand by >/=5 secs  Baseline: Lt - 59 secs 09/25/22: Lt hand - 43 secs Goal status: MET  3.  Pt will be able to place at least 3 more blocks using left hand with completion of Box and Blocks test.  Baseline: Lt- 19 blocks 09/25/22: Lt - 31 blocks Goal status: MET  4.  Pt will tolerate >/=10 minutes of standing activity to simulate activity tolerance needed for ADL/IADL management. Baseline: Subjective description of sitting down for most tasks Goal status: IN PROGRESS  5.  Pt will write a sentence with no significant decrease in size and maintain 100% legibility.  Baseline: 75% legibility and mild micrographia Goal status: MET  6.  Pt will improve visual closure skills to recognize and complete visual patterns or missing parts of visual stimuli, enhancing visual perception and problem-solving abilities. Baseline: Overshooting eye movements, impaired peripheral vision, merging of letters per subjective report Goal status: IN PROGRESS  LONG  TERM GOALS: Target date: 11/11/2022   Pt will complete FOTO assessment at time of discharge scoring 68 or greater indicating functional progression with ADL and IADL completion. Baseline: 58  Goal status: IN PROGRESS  2.  Pt will demonstrate improved fine motor coordination for ADLs as evidenced by decreasing 9 hole peg test score for left hand by >/=10 secs  Baseline: Lt - 59 secs 09/25/22: Lt - 43 secs Goal status: MET  3.  Pt will be able to place at least 5 more blocks using left hand with completion of Box and Blocks test.  Baseline: Lt - 19 blocks 09/25/22: Lt- 31 blocks Goal status: MET  4.  Pt will improve dynamic standing balance to Good (stand independently unsupported, able to weight shift and cross midline moderately) as needed for managing ADL/IADL tasks. Baseline: Subjective description of Fair (stand independently unsupported, weight shift, and reach ipsilaterally, LOB when crossing midline) with rolling walker Goal status: IN PROGRESS  5.  Pt will verbalize at least two visual compensatory methods to include adaptive equipment as needed independently. Baseline:  Goal status: IN PROGRESS   ASSESSMENT:  CLINICAL IMPRESSION: Pt meeting 3/6 STG this visit. Discussed remaining goals and frequency of visits. Pt observed to be pleased with meeting some goals that had been established. Pt would continue to benefit from skilled OT services to address performance deficits as listed below, and to maximize independence in desired activities and activities of daily living.  PERFORMANCE DEFICITS: in functional skills including ADLs, IADLs, coordination, dexterity, pain, Fine motor control, Gross motor control, mobility, balance, body mechanics, endurance, vision, and UE functional use, cognitive skills including attention, energy/drive, and memory, and psychosocial skills including coping strategies, environmental adaptation, habits, and routines and behaviors.   IMPAIRMENTS: are  limiting patient from ADLs, IADLs, work, and leisure.   CO-MORBIDITIES: may have co-morbidities  that affects occupational performance. Patient will benefit from skilled OT to address above impairments and improve  overall function.  REHAB POTENTIAL: Good  PLAN:  OT FREQUENCY: 2x/week  OT DURATION: 10 weeks (anticipate 8 weeks, but pt going out of town for the summer months)  PLANNED INTERVENTIONS: self care/ADL training, therapeutic exercise, therapeutic activity, neuromuscular re-education, balance training, functional mobility training, electrical stimulation, fluidotherapy, moist heat, cryotherapy, patient/family education, cognitive remediation/compensation, visual/perceptual remediation/compensation, psychosocial skills training, energy conservation, coping strategies training, DME and/or AE instructions, and Re-evaluation  RECOMMENDED OTHER SERVICES: Follow-up with neuro ophthalmologist completed. Pt reports having an "eye doctor" that he goes to annually.  CONSULTED AND AGREED WITH PLAN OF CARE: Patient and family member/caregiver  PLAN FOR NEXT SESSION:  Continue standing tolerance for daily activities ie) golfing with personal putter (particular about which clubs go with which activities), balance of varying surfaces in prep for daughter's wedding, meal prep (as interest allows), vary Blaze Pod placement, continue progression of shoulder ROM with table top ball activities and vertical surfaces as tolerated. May also consider previous recommendations including toss ball back and forth if shoulder pain allows, continue visual scanning, desensitize scalp as pt reports still sensitive, fine motor skills with small pegs, typing  Reassess the rest of Short term goals for 09/30/22.   [address] Koden [place], [place] 09/25/2022, 10:59 AM

## 2022-09-30 ENCOUNTER — Ambulatory Visit: Payer: BC Managed Care – PPO

## 2022-09-30 ENCOUNTER — Ambulatory Visit: Payer: BC Managed Care – PPO | Admitting: Speech Pathology

## 2022-09-30 DIAGNOSIS — R41842 Visuospatial deficit: Secondary | ICD-10-CM | POA: Diagnosis not present

## 2022-09-30 DIAGNOSIS — R269 Unspecified abnormalities of gait and mobility: Secondary | ICD-10-CM | POA: Diagnosis not present

## 2022-09-30 DIAGNOSIS — R278 Other lack of coordination: Secondary | ICD-10-CM

## 2022-09-30 DIAGNOSIS — R2681 Unsteadiness on feet: Secondary | ICD-10-CM

## 2022-09-30 DIAGNOSIS — R42 Dizziness and giddiness: Secondary | ICD-10-CM | POA: Diagnosis not present

## 2022-09-30 DIAGNOSIS — R471 Dysarthria and anarthria: Secondary | ICD-10-CM

## 2022-09-30 DIAGNOSIS — M6281 Muscle weakness (generalized): Secondary | ICD-10-CM

## 2022-09-30 DIAGNOSIS — R262 Difficulty in walking, not elsewhere classified: Secondary | ICD-10-CM | POA: Diagnosis not present

## 2022-09-30 DIAGNOSIS — R41840 Attention and concentration deficit: Secondary | ICD-10-CM

## 2022-09-30 DIAGNOSIS — R1310 Dysphagia, unspecified: Secondary | ICD-10-CM | POA: Diagnosis not present

## 2022-09-30 NOTE — Therapy (Signed)
OUTPATIENT SPEECH LANGUAGE PATHOLOGY TREATMENT NOTE   Patient Name: Christopher Yu MRN: [id number] DOB:03/22/1961, 62 y.o., male Today's Date: 09/30/2022  PCP: Thana Ates, MD REFERRING PROVIDER: Charlton Amor, PA-C   END OF SESSION:   End of Session - 09/30/22 0928     Visit Number 7    Number of Visits 17    Date for SLP Re-Evaluation 10/24/22    Authorization Type [employer]    SLP Start Time 0930    SLP Stop Time  1015    SLP Time Calculation (min) 45 min    Activity Tolerance Patient tolerated treatment well   RA flare in bilateral shoulders               Past Medical History:  Diagnosis Date   Anxiety    Arthritis, rheumatoid (HCC)    dx 1988   CAD (coronary artery disease), native coronary artery    Mild LAD disease with calcification noted at cath 12//27/16    Cardiomyopathy (HCC)    Cardiomyopathy (HCC)    Chronic systolic heart failure (HCC) 05/01/2015   Depression    Deviated nasal septum 06/25/2011   Headache    High cholesterol    Hyperlipidemia    Impingement syndrome of left shoulder 12/22/2017   Macular degeneration    Nausea and vomiting 08/28/2022   Rheumatoid aortitis    Sleep apnea    does not use cpap - UNABLE TO TOLERATE MASK   Sleep apnea in adult    deviated septum repaired, most recent sleep study was negative   Status post total right knee replacement 06/01/2015   Past Surgical History:  Procedure Laterality Date   ANKLE FUSION Right 05/05/2012   related to his arthritis   ANKLE FUSION Left 05/25/2013   related to his arthritis   ANKLE SURGERY Bilateral    APPLICATION OF CRANIAL NAVIGATION N/A 04/11/2022   Procedure: APPLICATION OF CRANIAL NAVIGATION;  Surgeon: Lisbeth Renshaw, MD;  Location: [facility] OR;  Service: Neurosurgery;  Laterality: N/A;   APPLICATION OF CRANIAL NAVIGATION N/A 08/08/2022   Procedure: APPLICATION OF CRANIAL NAVIGATION;  Surgeon: Lisbeth Renshaw, MD;  Location: [facility] OR;  Service: Neurosurgery;   Laterality: N/A;   CARDIAC CATHETERIZATION N/A 05/01/2015   Procedure: Left Heart Cath and Coronary Angiography;  Surgeon: Lyn Records, MD;  Location: [facility] INVASIVE CV LAB;  Service: Cardiovascular;  Laterality: N/A;   CRANIOTOMY N/A 04/11/2022   Procedure: STEREOTACTIC SUBOCCIPITAL CRANIOTOMY FOR RESECTION OF ARTERIO-VENOUS MALFORMATION;  Surgeon: Lisbeth Renshaw, MD;  Location: [facility] OR;  Service: Neurosurgery;  Laterality: N/A;   FRACTURE SURGERY     left femur fracture x 3   IR ANGIO EXTERNAL CAROTID SEL EXT CAROTID BILAT MOD SED  01/28/2022   IR ANGIO INTRA EXTRACRAN SEL INTERNAL CAROTID BILAT MOD SED  01/28/2022   IR ANGIO INTRA EXTRACRAN SEL INTERNAL CAROTID BILAT MOD SED  04/16/2022   IR ANGIO VERTEBRAL SEL VERTEBRAL BILAT MOD SED  01/28/2022   IR ANGIO VERTEBRAL SEL VERTEBRAL BILAT MOD SED  04/16/2022   KNEE ARTHROSCOPY     right x 4   LAPAROSCOPIC REVISION VENTRICULAR-PERITONEAL (V-P) SHUNT N/A 08/08/2022   Procedure: LAPAROSCOPIC VENTRICULAR-PERITONEAL (V-P) SHUNT;  Surgeon: Harriette Bouillon, MD;  Location: [facility] OR;  Service: General;  Laterality: N/A;   LUMBAR LAMINECTOMY/DECOMPRESSION MICRODISCECTOMY N/A 07/18/2022   Procedure: Repair of Pseudomeningiocele posterior;  Surgeon: Lisbeth Renshaw, MD;  Location: [facility] OR;  Service: Neurosurgery;  Laterality: N/A;   NASAL SEPTOPLASTY W/ TURBINOPLASTY  06/25/2011   Procedure: NASAL SEPTOPLASTY WITH TURBINATE REDUCTION;  Surgeon: Osborn Coho, MD;  Location: [facility] OR;  Service: ENT;  Laterality: Bilateral;   PLACEMENT OF LUMBAR DRAIN N/A 07/18/2022   Procedure: PLACEMENT OF LUMBAR DRAIN;  Surgeon: Lisbeth Renshaw, MD;  Location: [facility] OR;  Service: Neurosurgery;  Laterality: N/A;   TONSILLECTOMY     TOTAL KNEE ARTHROPLASTY Right 06/01/2015   Procedure: RIGHT TOTAL KNEE ARTHROPLASTY;  Surgeon: Kathryne Hitch, MD;  Location: [facility] ORS;  Service: Orthopedics;  Laterality: Right;  Block+general   VENTRICULOPERITONEAL SHUNT Right 08/08/2022    Procedure: LAP ASSITED SHUNT INSERTION VENTRICULAR-PERITONEAL;  Surgeon: Lisbeth Renshaw, MD;  Location: [facility] OR;  Service: Neurosurgery;  Laterality: Right;   Patient Active Problem List   Diagnosis Date Noted   Nausea and vomiting 08/28/2022   Adjustment disorder 08/06/2022   Protein-calorie malnutrition, severe 07/31/2022   Cerebellar stroke (HCC) 07/29/2022   Ischemic cerebrovascular accident (CVA) (HCC) 07/28/2022   Prolonged QT interval 07/28/2022   Pseudomeningocele 07/18/2022   Pseudomeningocele due to surgical procedure 07/18/2022   Status post craniotomy 04/11/2022   AVM (arteriovenous malformation) brain 04/11/2022   Impingement syndrome of left shoulder 12/22/2017   Impingement syndrome of right shoulder 12/22/2017   Rheumatoid arthritis involving right knee (HCC) 06/01/2015   Status post total right knee replacement 06/01/2015   Cardiomyopathy (HCC) 05/02/2015   Hyperlipidemia    Sleep apnea in adult    Rheumatoid arthritis (HCC)    Chronic systolic heart failure (HCC) 05/01/2015   CAD (coronary artery disease), native coronary artery     ONSET DATE: 07/28/22  REFERRING DIAG:  I63.9 (ICD-10-CM) - Cerebral infarction, unspecified   THERAPY DIAG:  Dysarthria and anarthria  Rationale for Evaluation and Treatment: Rehabilitation  SUBJECTIVE:   SUBJECTIVE STATEMENT: "Good."   PAIN:  Are you having pain? Yes NPRS scale: 8/10 Pain location: right shoulder Pain orientation: Right  PAIN TYPE: aching Pain description: constant  Aggravating factors: movement Relieving factors: rest   OBJECTIVE:   TODAY'S TREATMENT:                                                                                                                                         DATE:   09/30/22: Patient has a graduation party this weekend. He plans on avoiding people and not talking. Spouse, and SLP encouraged him to speak more and to plan topics beforehand. Led through answering questions  and problem solving. Was able to speak in 30 minute conversation with min-A, and encouragement. Intelligibility at 90%, with occasional collapse of longer words, however, patient was able to clear throat and self correct. Voice sounded less groggy/wet when compared to last week. Patient spoke more with desired topics, ie golf, politics between [place] and [place]    09/25/22: Led through cognitive exercises to state similarities and differences between items with min-A. Patient carried over slow rate of speech  from structured task to conversational speech through telling of a story of friends. Speech clear, with min-cuing. Pt states that speech sounds clear when asked to self monitor. Unable to do HEP sent home 5/20 due to graduation. States that graduation required lots of talking and that he feels like it went well. SLP noted voice quality to be wet and groggy, that did not diminish with throat clear and water, however this did not decrease intelligibility.    5/20/24[name] with limited completion of HEP at home as they had company from out of town. He completed HEP using slow rate, over articulation repeating multi-syllabic words and generating sentence with the word with occasional min verbal cues and modeling. Speech today more slurred than last session - suspect fatigue due to company this weekend. In structured task generating 3 sentence salient description of vegetables, Magdaleno carried over slow rate and over articulation requiring 2 verbal cues to ID unintelligible words (out of 15 sentences) and to repeat intelligibly. In repetition task (due to reduced vision) Keair stated 4 words and Id'd the item that did not belong and explained way using intelligible speech 10/10x. In conversation re: grilling/cooking, he maintained intelligible speech in this quiet environment, however as noted before, speech with increased imprecision and slur today. Sequencing homework provided - family will need to read this  to him  09/18/22: Pt reports ongoing daily completion of effortful swallow to address dysphagia. Subjective improvement of swallow function per pt report with reduced instances of coughing with liquids and no longer using thickened liquids. In structured practice targeting tri-syllabic words at generative sentence level, pt with 100% accuracy. Practice naturally occurring in conversational speech results in ~70% accuracy of 3+ syllable words with increased challenges endorsed by pt. Barrier to communication at home is pt resistant to repeating himself. Education provided regarding increased complexity of using strategies in conversation to encourage pt to give himself grace when breakdowns occur. Add oral reading to HEP, if able d/t vision concerns related to dry eyes.   09/15/22: Reviewed HEP for dysphagia - Paulmichael demonstrated tongue press , 5 second hold with hard swallow 7/7x with rare min A. He is not completing chin tuck against resistance or Shaker's due to pain at incision site. He is completing tongue press hard swallow regularly during the day. [name] completed HEP for dysarthria with rare min A carrying over compensations and over articulation. In structured task repeating and generating sentences with multi-syllabic words, Curran generated 10/12 sentences intelligibly - requiring repetition on 2/10. Sentences vague and tangential at times, for example, the word representative he stated "A representative is coming to my house on Friday" Questioning and verbal cues revealed that his family from [place] are coming to visit on Friday. Coolidge also endorses difficulty processing what he wants to say. Targeted processing and carryover of dysarthria strategies generating 3 sentence description of basic words Kaptain gave me the clues "my daughters don't take this to school" or "this is what I want to eat" He required ongoing verbal A to generate salient descriptions for his listener, me who doesn't know him  personally. Provided home work for this task as well. He plans on attending an award ceremony and graduation for his daughter. Education provided re: strategies and awareness of sensory overload as Graden reports he frustrates easily and generated strategy of yelling at the person who " was annoying me" - Verbal cue and education to use strategy of telling [name] he may need to leave or go to a  more quiet spot.  09/10/22: Initiated HEP for dysarthria - see patient instructions - Initiated training in compensations for dysarthria (slow, loud. Over articulation and pause) With cues to open mouth wide, make each sound distinct, Himmat completed sentences intelligibly 18/20.  Using compensations in structured task, generating 3 sentence description of basic object, Obie carried over strategies to be intelligible 10/12 sentences with consistent modeling and occasional min verbal cues. Repetition required 2/12 sentences with mod I at correcting errors to intelligible speech. In simple conversation he maintained intelligible speech with rare min A over 4 minutes. Caisyn required verbal cue to look right with 2 swallows when he took tylenol  08/29/22: updated HEP. Direct instruction for swallow exercises with pt able to confirm understanding via demonstration with min-A.      PATIENT EDUCATION: Education details: POC, goals, evaluation results/recommendations Person educated: Patient and Spouse Education method: Explanation, Demonstration, Verbal cues, and Handouts Education comprehension: verbalized understanding, returned demonstration, and needs further education     GOALS: Goals reviewed with patient? Yes   SHORT TERM GOALS: Target date: 10/03/2022   Pt will teach back dysarthria strategies and compensations with mod-I Baseline: Goal status: ONGOING   2.   Pt will employ dysarthria strategies during structured practice activities resulting in intelligible production at mult-sentence level in 80% of  trials Baseline:  Goal status: ONGOING   3. Pt will demonstrate use of swallow compensations/strategies with PO trials with mod-I over 2 sessions  Baseline:  Goal status: ONGOING   4.  Pt will complete HEP 6/7 days over 1 week period with mod-I Baseline:  Goal status: ONGOING     LONG TERM GOALS: Target date: 10/24/2022   Pt will complete repeat MBSS Baseline:  Goal status: ONGOING   2.  Pt will carryover dysarthria strategies/compensations during 20 minute conversation resulting in 90% ineligibility  Baseline:  Goal status: ONGOING   3.  Pt and spouse will report subjective report of reduced need for requests for repetition by 50% at home, over 1 week period Baseline:  Goal status:ONGOING   4.  Pt will report improvement via PROM by dc  Baseline:  Goal status: ONGOING     ASSESSMENT:   CLINICAL IMPRESSION: Patient is a 62 y.o. M who was seen today for dysphagia and motor speech eval s/p cerebellar stroke. Evaluation reveals moderate dysarthria and recent MBSS demonstrates mild pharyngeal dysphagia. Pt's speech is c/b irregular imprecise articulation and distortions, volume decay on expanded utterances, and reduced use of prosody. Pt reports frustration regarding his communicative abilities, tells SLP his vocation has high level of demands for communication and he feels he will "never get back there, it's impossible." As evaluation progressed, pt with increased frustration with SLP providing education to encourage participation and positive mindset for engagement in therapy course. Brief introduction to dysarthria strategies initiated. Reviewed instrumental swallow study results and updated pt's dysphagia HEP to accommodate preferences while targeting demonstrated deficits. Will plan to request repeat MBSS following usual completion of dysphagia HEP over 1-2 month period. Pt would benefit from skilled ST to address aforementioned deficits to improve QoL, enhance communication  efficacy, and facilitate improved swallow function/safety .      OBJECTIVE IMPAIRMENTS: include dysarthria and dysphagia. These impairments are limiting patient from return to work, effectively communicating at home and in community, and safety when swallowing. Factors affecting potential to achieve goals and functional outcome are cooperation/participation level. Patient will benefit from skilled SLP services to address above impairments and improve overall function.  REHAB POTENTIAL: Good   PLAN:   SLP FREQUENCY: 2x/week   SLP DURATION: 8 weeks   PLANNED INTERVENTIONS: Aspiration precaution training, Pharyngeal strengthening exercises, Diet toleration management , Trials of upgraded texture/liquids, Cueing hierachy, Internal/external aids, Functional tasks, Multimodal communication approach, SLP instruction and feedback, Compensatory strategies, Patient/family education, and Re-evaluation     [place], Student-SLP 09/30/2022, 9:29 AM

## 2022-09-30 NOTE — Patient Instructions (Addendum)
Visual memory compensation strategies: Magnification 2. Increasing lighting 3. Increasing contrast 4. Tactile cues 5. Blocking

## 2022-09-30 NOTE — Therapy (Signed)
OUTPATIENT OCCUPATIONAL THERAPY NEURO TREATMENT  Patient Name: Christopher Yu MRN: [id number] DOB:04-14-1961, 62 y.o., male Today's Date: 09/30/2022  PCP: Christopher Ates, MD REFERRING PROVIDER: Charlton Amor, PA-C  END OF SESSION:  OT End of Session - 09/30/22 0845     Visit Number 9    Number of Visits 20    Date for OT Re-Evaluation 06/20/22    Authorization Type [employer]    OT Start Time 0845    OT Stop Time 0930    OT Time Calculation (min) 45 min    Activity Tolerance Patient tolerated treatment well;Patient limited by fatigue    Behavior During Therapy [facility] for tasks assessed/performed;Flat affect             Past Medical History:  Diagnosis Date   Anxiety    Arthritis, rheumatoid (HCC)    dx 1988   CAD (coronary artery disease), native coronary artery    Mild LAD disease with calcification noted at cath 12//27/16    Cardiomyopathy (HCC)    Cardiomyopathy (HCC)    Chronic systolic heart failure (HCC) 05/01/2015   Depression    Deviated nasal septum 06/25/2011   Headache    High cholesterol    Hyperlipidemia    Impingement syndrome of left shoulder 12/22/2017   Macular degeneration    Nausea and vomiting 08/28/2022   Rheumatoid aortitis    Sleep apnea    does not use cpap - UNABLE TO TOLERATE MASK   Sleep apnea in adult    deviated septum repaired, most recent sleep study was negative   Status post total right knee replacement 06/01/2015   Past Surgical History:  Procedure Laterality Date   ANKLE FUSION Right 05/05/2012   related to his arthritis   ANKLE FUSION Left 05/25/2013   related to his arthritis   ANKLE SURGERY Bilateral    APPLICATION OF CRANIAL NAVIGATION N/A 04/11/2022   Procedure: APPLICATION OF CRANIAL NAVIGATION;  Surgeon: Christopher Renshaw, MD;  Location: [facility] OR;  Service: Neurosurgery;  Laterality: N/A;   APPLICATION OF CRANIAL NAVIGATION N/A 08/08/2022   Procedure: APPLICATION OF CRANIAL NAVIGATION;  Surgeon: Christopher Renshaw, MD;  Location: [facility] OR;  Service: Neurosurgery;  Laterality: N/A;   CARDIAC CATHETERIZATION N/A 05/01/2015   Procedure: Left Heart Cath and Coronary Angiography;  Surgeon: Christopher Records, MD;  Location: [facility] INVASIVE CV LAB;  Service: Cardiovascular;  Laterality: N/A;   CRANIOTOMY N/A 04/11/2022   Procedure: STEREOTACTIC SUBOCCIPITAL CRANIOTOMY FOR RESECTION OF ARTERIO-VENOUS MALFORMATION;  Surgeon: Christopher Renshaw, MD;  Location: [facility] OR;  Service: Neurosurgery;  Laterality: N/A;   FRACTURE SURGERY     left femur fracture x 3   IR ANGIO EXTERNAL CAROTID SEL EXT CAROTID BILAT MOD SED  01/28/2022   IR ANGIO INTRA EXTRACRAN SEL INTERNAL CAROTID BILAT MOD SED  01/28/2022   IR ANGIO INTRA EXTRACRAN SEL INTERNAL CAROTID BILAT MOD SED  04/16/2022   IR ANGIO VERTEBRAL SEL VERTEBRAL BILAT MOD SED  01/28/2022   IR ANGIO VERTEBRAL SEL VERTEBRAL BILAT MOD SED  04/16/2022   KNEE ARTHROSCOPY     right x 4   LAPAROSCOPIC REVISION VENTRICULAR-PERITONEAL (V-P) SHUNT N/A 08/08/2022   Procedure: LAPAROSCOPIC VENTRICULAR-PERITONEAL (V-P) SHUNT;  Surgeon: Christopher Bouillon, MD;  Location: [facility] OR;  Service: General;  Laterality: N/A;   LUMBAR LAMINECTOMY/DECOMPRESSION MICRODISCECTOMY N/A 07/18/2022   Procedure: Repair of Pseudomeningiocele posterior;  Surgeon: Christopher Renshaw, MD;  Location: [facility] OR;  Service: Neurosurgery;  Laterality: N/A;   NASAL SEPTOPLASTY W/ TURBINOPLASTY  06/25/2011   Procedure: NASAL SEPTOPLASTY WITH TURBINATE REDUCTION;  Surgeon: Christopher Coho, MD;  Location[facility] OR;  Service: ENT;  Laterality: Bilateral;   PLACEMENT OF LUMBAR DRAIN N/A 07/18/2022   Procedure: PLACEMENT OF LUMBAR DRAIN;  Surgeon: Christopher Renshaw, MD;  Location: [facility] OR;  Service: Neurosurgery;  Laterality: N/A;   TONSILLECTOMY     TOTAL KNEE ARTHROPLASTY Right 06/01/2015   Procedure: RIGHT TOTAL KNEE ARTHROPLASTY;  Surgeon: Christopher Hitch, MD;  Location: [facility] ORS;  Service: Orthopedics;  Laterality: Right;   Block+general   VENTRICULOPERITONEAL SHUNT Right 08/08/2022   Procedure: LAP ASSITED SHUNT INSERTION VENTRICULAR-PERITONEAL;  Surgeon: Christopher Renshaw, MD;  Location: [facility] OR;  Service: Neurosurgery;  Laterality: Right;   Patient Active Problem List   Diagnosis Date Noted   Nausea and vomiting 08/28/2022   Adjustment disorder 08/06/2022   Protein-calorie malnutrition, severe 07/31/2022   Cerebellar stroke (HCC) 07/29/2022   Ischemic cerebrovascular accident (CVA) (HCC) 07/28/2022   Prolonged QT interval 07/28/2022   Pseudomeningocele 07/18/2022   Pseudomeningocele due to surgical procedure 07/18/2022   Status post craniotomy 04/11/2022   AVM (arteriovenous malformation) brain 04/11/2022   Impingement syndrome of left shoulder 12/22/2017   Impingement syndrome of right shoulder 12/22/2017   Rheumatoid arthritis involving right knee (HCC) 06/01/2015   Status post total right knee replacement 06/01/2015   Cardiomyopathy (HCC) 05/02/2015   Hyperlipidemia    Sleep apnea in adult    Rheumatoid arthritis (HCC)    Chronic systolic heart failure (HCC) 05/01/2015   CAD (coronary artery disease), native coronary artery     ONSET DATE: 08/21/2022 (date of referral)  REFERRING DIAG: I63.9 (ICD-10-CM) - Cerebral infarction, unspecified  THERAPY DIAG:  Muscle weakness (generalized)  Other lack of coordination  Unsteadiness on feet  Visuospatial deficit  Attention and concentration deficit  Rationale for Evaluation and Treatment: Rehabilitation  SUBJECTIVE:   SUBJECTIVE STATEMENT: Pt reports eye drops are helping with his vision.  Pt accompanied by: self and significant other, Christopher Yu  PERTINENT HISTORY: CT/MRI showed new small focus of acute infarction in the right superior cerebellar hemisphere. Cerebellar AVM resection 12/23, pseudomeningocele s/p craniotomy 3/24, acute R cerebellar infarct, hydrocephalus and VP shunt was placed on 08/08/22, dysphagia, dysarthria, aphasia. PMH:  rheumatoid arthritis, bilateral ankle fusion (2014 and 2015), cardiomyopathy, CAD, R TKA, cardiomyopathy, chronic systolic heart failure, macular degeneration, OSA on CPAP  PRECAUTIONS: Fall and Other: swallow precautions with modification with left head tilt and supervision if drinking thinned liquids, per caregiver not to lift, history of orthostatic hypotension resulting in falls; no driving  WEIGHT BEARING RESTRICTIONS: No  PAIN:  Are you having pain? No  FALLS: Has patient fallen in last 6 months? Yes. Number of falls 3 - after the first surgery, got dizzy and fell, using the walker, when up and moving, struggled with orthostatic hypotension but medical team has since decreased BP medications  LIVING ENVIRONMENT: Lives with: lives with their spouse, two teenage daughters (also has dtr who lives in [place]), and 175 lb dog Lives in: House/apartment - Able to live on main floor Stairs: Yes: Internal: ~14 steps; on right going up and External: 3 steps; on right going up Has following equipment at home: Dan Humphreys - 2 wheeled and Wheelchair (manual) Bathroom setup: [employer], regular height toilet  PLOF: Prior to December, pt completely independent with ADL/IADLs, ambulatory without device, pt enjoys golfing, driving, 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  PATIENT GOALS: Return to golf, improve vision for work, travel in June for daughter's  outdoor wedding (be off the walker ideally)  OBJECTIVE: (results from evaluation unless otherwise stated)  HAND DOMINANCE: Right  ADLs: Eating: Independent with self-feeding, wife cuts items due to impaired coordination per her report Grooming: Independent mostly, but reports it's painful to brush hair from surgical procedure in December UB Dressing: Supervision, increased time and wife present if needed LB Dressing: Supervision increased time and wife present if needed. Sometimes sits to don pants Toileting: Supervision, more effort, wife there in  case pt loses balance Bathing: Supervision for balance Tub Shower transfers: Supervision likely; however, sponge bath currently due to hand held shower head breaking and plumber coming 5/1.  Equipment:  Toilet riser with armrests , shower chair, bedrail on bed  IADLs: Shopping: Currently unable, previously independent Light housekeeping: Currently unable, previously did laundry Meal Prep: Currently unable, but previously assisted in task Community mobility: and household, pt utilizing rolling walker at all times. Typically sedentary in recliner most of the day. Gets up for bathroom.  Medication management: Wife Presenter, broadcasting: Currently unable, pt typically did before Handwriting: 75% legible and Mild micrographia   MOBILITY STATUS:  Pt ambulatory with RW, wife providing supervision  at all times  POSTURE COMMENTS:  rounded shoulders Sitting balance: Moves/returns truncal midpoint 1-2 inches in multiple planes - Wife describes she believes it is more of a coordination issue, overshooting and then losing balance.   ACTIVITY TOLERANCE: Activity tolerance: Subjective report of significant fatigue  FUNCTIONAL OUTCOME MEASURES: FOTO: 58 score  UPPER EXTREMITY ROM:    Active ROM Right eval Left eval  Shoulder flexion [facility]  Shoulder abduction [facility]  Shoulder adduction [facility] WFL  Shoulder extension [facility]  Shoulder internal rotation    Shoulder external rotation    Elbow flexion WFL WFL  Elbow extension [facility] WFL  Wrist flexion    Wrist extension    Wrist ulnar deviation    Wrist radial deviation    Wrist pronation    Wrist supination    (Blank rows = not tested)  UPPER EXTREMITY MMT:     MMT Right eval Left eval  Shoulder flexion 4+/5 4+/5  Shoulder abduction    Shoulder adduction    Shoulder extension    Shoulder internal rotation    Shoulder external rotation    Middle trapezius    Lower trapezius    Elbow flexion 4+/5 4+/5  Elbow extension  4+/5 4+/5  Wrist flexion    Wrist extension    Wrist ulnar deviation    Wrist radial deviation    Wrist pronation    Wrist supination    (Blank rows = not tested)  HAND FUNCTION: Grip strength: Right: 67.2 lbs; Left: 66.3 lbs and 3 point pinch: Right: 12 lbs, Left: 15 lbs  COORDINATION: 9 Hole Peg test: Right: 31 sec; Left: 59 sec Box and Blocks:  Right 27 blocks, Left 19blocks  SENSATION: WFL  EDEMA: None  MUSCLE TONE: WFL  COGNITION: Overall cognitive status:  Grossly WFL, possible for attention deficits.  VISION: Subjective report: Pt reports some jerky vision since December Baseline vision: Wears glasses all the time Visual history: cataracts and macular degeneration  VISION ASSESSMENT: Impaired Eye alignment: Impaired: Right eye seemed to have amblyopia-type appearance with visual pursuits  Reading acuity: Pt reports difficulty reading instructions at this time, he reports all letters merge together Tracking/Visual pursuits: Right eye appears to be slower than left Saccades: overshoots - right eye from temporal to nasal Convergence: WFL Visual Fields: Grossly intact;  however, decreased peripheral vision to right side when provided with moving target with detection of object at 30* Diplopia assessment: When given macular degeneration Amsler grid, pt reports double vision of right eye when left eye occluded. Quadrants - WFL   Patient has difficulty with following activities due to following visual impairments: Work related tasks  PERCEPTION: WFL  PRAXIS: WFL  OBSERVATIONS: Patient arrives to session ambulating with rolling walker. Patient wife primarily speaks during the session given patient's dysarthria. Per caregiver, patient had a surgery to address AVM and then had pseudomengiocele and underwent 3 surgeries as a result of complications. Per report, patient was D/C from hospital and then returned with symptoms of acute stroke. Prior to December patient was  fully independent, working full time in IT, and golfing regularly every weekend; however, patient is now fully supervised for all ADLs, not working, and now mobilizing either via manual W/C or walker. Patient reports increased nausea disrupting appetite. Family reports going to the ED on last Wednesday due to new reports of nausea and double vision and there was concern for another stroke; however, family decided to the return home when CT came back clear and did not want to go through additional MRI. Per family, inpatient rehab physician aware of this and recommended they call their neurosurgeon for follow up as well and did not advise against continuing outpatient therapy. Family also expresses concern in not knowing how to truly recognize a stroke as patient now has such extensive deficits. Family also reports that a therapist in inpatient rehab noticed unique eye movements and recommend they also see a therapist that could help address vestibular concerns. Wife showing OT video today. Video showed pt performing visual pursuits with jerky eye movement going from left to right sides, right eye impacted.  09/09/22: Pt received from PT lying on mat table. Pt stating taking a rest break. Pt denies pain or dizziness.  09/30/22: Pt ambulatory with SPC and well-kept. Pt wearing glasses.   TODAY'S TREATMENT:                                                                                                                              DATE:  Therapeutic activity - Review of remaining goals this visit to address current deficits and pt functional limitations, and how to address pt goals moving forward. Pt having most difficulty with vision and coordination. OT addressing vision this date. Pt performed seated visual activities of visual closure, figure-ground, and visual spatial worksheets. Pt having most difficulty with visual closure requiring 4 trials before getting 6/6 correct. The next visual closure activity, pt  completing 3/6 correctly. Pt missing minor details of "draw other half" picture. Pt completing visual spatial worksheet without error. Pt then completing figure ground worksheet with instruction to count how many of each item. Pt scoring 4/10 correctly. Pt instructed to mark off items as he saw them, or circle to keep track of how many on paper. This  improved figure-ground. Pt also verbalizing items that did not match the picture he did not count. Discussed if this was due to difficulty with visual-closure; however, pt states the matching item did not perfectly match the picture. Wife confirms pt is quite literal and believes this was this issue for this worksheet in particular. Self-care/Home management - Pt then ambulating to kitchen space to simulate balance required for IADL management. Pt able to stand at sink, reach for items with RUE and cross midline to place on left side, then bring back to starting point. Pt then performing similar movement pattern utilizing LUE to grasp item, cross midline to place item, and then bringing it back to the starting point. Pt performing x 3 trials each side. Pt demonstrating Good dynamic standing balance (stand independently unsupported, able to weight shift and cross midline moderately).  PATIENT EDUCATION: Education details: Review of remaining goals, visual compensatory strategies Education provided to: Patient, spouse Delivered by: Verbal cues, handouts, verbal cues Pt receptive   HOME EXERCISE PROGRAM: 09/02/22: Coordination HEP 09/04/22: Modified coordination HEP (for vision) and vision HEP 09/18/22: No hand out provided but encouraged to work on standing activities throughout the day and combine with table top ball rolling for shoulder ROM. 09/25/22: None 09/30/22: None  GOALS: Goals reviewed with patient? Yes  SHORT TERM GOALS: Target date: 09/30/2022   Pt will be independent in LUE HEP. Baseline: Initiated 09/25/22: Completing independently Goal  status: MET  2.  Pt will demonstrate improved fine motor coordination for ADLs as evidenced by decreasing 9 hole peg test score for left hand by >/=5 secs  Baseline: Lt - 59 secs 09/25/22: Lt hand - 43 secs Goal status: MET  3.  Pt will be able to place at least 3 more blocks using left hand with completion of Box and Blocks test.  Baseline: Lt- 19 blocks 09/25/22: Lt - 31 blocks Goal status: MET  4.  Pt will tolerate >/=10 minutes of standing activity to simulate activity tolerance needed for ADL/IADL management. Baseline: Subjective description of sitting down for most tasks Goal status: IN PROGRESS  5.  Pt will write a sentence with no significant decrease in size and maintain 100% legibility.  Baseline: 75% legibility and mild micrographia 09/30/22: 100% legible and normal size Goal status: MET  6.  Pt will improve visual closure skills to recognize and complete visual patterns or missing parts of visual stimuli, enhancing visual perception and problem-solving abilities. Baseline: Overshooting eye movements, impaired peripheral vision, merging of letters per subjective report 09/2822: Visual closure worksheet 3/6 correct Goal status: IN PROGRESS  LONG TERM GOALS: Target date: 11/11/2022   Pt will complete FOTO assessment at time of discharge scoring 68 or greater indicating functional progression with ADL and IADL completion. Baseline: 58  09/30/22: 61 Goal status: IN PROGRESS  2.  Pt will demonstrate improved fine motor coordination for ADLs as evidenced by decreasing 9 hole peg test score for left hand by >/=10 secs  Baseline: Lt - 59 secs 09/25/22: Lt - 43 secs Goal status: MET  3.  Pt will be able to place at least 5 more blocks using left hand with completion of Box and Blocks test.  Baseline: Lt - 19 blocks 09/25/22: Lt- 31 blocks Goal status: MET  4.  Pt will improve dynamic standing balance to Good (stand independently unsupported, able to weight shift and cross midline  moderately) as needed for managing ADL/IADL tasks. Baseline: Subjective description of Fair (stand independently unsupported, weight shift, and  reach ipsilaterally, LOB when crossing midline) with rolling walker 09/30/22: Good Goal status: MET  5.  Pt will verbalize at least two visual compensatory methods to include adaptive equipment as needed independently. Baseline:  09/30/22: Education provided Goal status: IN PROGRESS   ASSESSMENT:  CLINICAL IMPRESSION: Pt demonstrating overall progress when reviewing STGs and LTGs. Discussed with pt main focus of addressing vision and coordination for subsequent visits. Pt receptive. Pt would continue to benefit from skilled OT services to address performance deficits as listed below, and to maximize independence in ADL/IADLs.  PERFORMANCE DEFICITS: in functional skills including ADLs, IADLs, coordination, dexterity, pain, Fine motor control, Gross motor control, mobility, balance, body mechanics, endurance, vision, and UE functional use, cognitive skills including attention, energy/drive, and memory, and psychosocial skills including coping strategies, environmental adaptation, habits, and routines and behaviors.   IMPAIRMENTS: are limiting patient from ADLs, IADLs, work, and leisure.   CO-MORBIDITIES: may have co-morbidities  that affects occupational performance. Patient will benefit from skilled OT to address above impairments and improve overall function.  REHAB POTENTIAL: Good  PLAN:  OT FREQUENCY: 2x/week  OT DURATION: 10 weeks (anticipate 8 weeks, but pt going out of town for the summer months)  PLANNED INTERVENTIONS: self care/ADL training, therapeutic exercise, therapeutic activity, neuromuscular re-education, balance training, functional mobility training, electrical stimulation, fluidotherapy, moist heat, cryotherapy, patient/family education, cognitive remediation/compensation, visual/perceptual remediation/compensation, psychosocial  skills training, energy conservation, coping strategies training, DME and/or AE instructions, and Re-evaluation  RECOMMENDED OTHER SERVICES: Follow-up with neuro ophthalmologist completed. Pt reports having an "eye doctor" that he goes to annually.  CONSULTED AND AGREED WITH PLAN OF CARE: Patient and family member/caregiver  PLAN FOR NEXT SESSION:  Continue standing tolerance for daily activities ie) golfing with personal putter (particular about which clubs go with which activities), balance of varying surfaces in prep for daughter's wedding, meal prep (as interest allows), vary Blaze Pod placement, continue progression of shoulder ROM with table top ball activities and vertical surfaces as tolerated. May also consider previous recommendations including toss ball back and forth if shoulder pain allows, continue visual activities, fine motor skills with small pegs, typing with dual computers  *Pt wife asks about tremoring of LUE, for example with resting on it or when reaching for items. Need to research options for pt.   [address] Jacklynn Dehaas, [place] 09/30/2022, 11:08 AM

## 2022-10-02 ENCOUNTER — Ambulatory Visit: Payer: BC Managed Care – PPO | Admitting: Speech Pathology

## 2022-10-02 ENCOUNTER — Ambulatory Visit: Payer: BC Managed Care – PPO

## 2022-10-02 ENCOUNTER — Encounter: Payer: Self-pay | Admitting: Physical Therapy

## 2022-10-02 ENCOUNTER — Ambulatory Visit: Payer: BC Managed Care – PPO | Admitting: Physical Therapy

## 2022-10-02 VITALS — BP 115/80 | HR 105

## 2022-10-02 DIAGNOSIS — R41842 Visuospatial deficit: Secondary | ICD-10-CM | POA: Diagnosis not present

## 2022-10-02 DIAGNOSIS — R471 Dysarthria and anarthria: Secondary | ICD-10-CM | POA: Diagnosis not present

## 2022-10-02 DIAGNOSIS — R278 Other lack of coordination: Secondary | ICD-10-CM | POA: Diagnosis not present

## 2022-10-02 DIAGNOSIS — M6281 Muscle weakness (generalized): Secondary | ICD-10-CM | POA: Diagnosis not present

## 2022-10-02 DIAGNOSIS — R2681 Unsteadiness on feet: Secondary | ICD-10-CM

## 2022-10-02 DIAGNOSIS — R269 Unspecified abnormalities of gait and mobility: Secondary | ICD-10-CM | POA: Diagnosis not present

## 2022-10-02 DIAGNOSIS — R262 Difficulty in walking, not elsewhere classified: Secondary | ICD-10-CM | POA: Diagnosis not present

## 2022-10-02 DIAGNOSIS — R42 Dizziness and giddiness: Secondary | ICD-10-CM | POA: Diagnosis not present

## 2022-10-02 DIAGNOSIS — R41840 Attention and concentration deficit: Secondary | ICD-10-CM | POA: Diagnosis not present

## 2022-10-02 DIAGNOSIS — R1310 Dysphagia, unspecified: Secondary | ICD-10-CM | POA: Diagnosis not present

## 2022-10-02 NOTE — Therapy (Signed)
OUTPATIENT PHYSICAL THERAPY NEURO TREATMENT   Patient Name: Christopher Yu MRN: [id number] DOB:[birth year], 62 y.o., male Today's Date: 10/02/2022  PCP: Thana Ates, MD REFERRING PROVIDER: Charlton Amor, PA-C  END OF SESSION:  PT End of Session - 10/02/22 0940     Visit Number 10    Number of Visits 17    Date for PT Re-Evaluation 11/07/22    Authorization Type [employer]    PT Start Time [phone]    PT Stop Time 1012    PT Time Calculation (min) 41 min    Equipment Utilized During Treatment Gait belt    Activity Tolerance Patient tolerated treatment well   RA flare in bilateral shoulders   Behavior During Therapy WFL for tasks assessed/performed             Past Medical History:  Diagnosis Date   Anxiety    Arthritis, rheumatoid (HCC)    dx 1988   CAD (coronary artery disease), native coronary artery    Mild LAD disease with calcification noted at cath 12//27/16    Cardiomyopathy (HCC)    Cardiomyopathy (HCC)    Chronic systolic heart failure (HCC) 05/01/2015   Depression    Deviated nasal septum 06/25/2011   Headache    High cholesterol    Hyperlipidemia    Impingement syndrome of left shoulder 12/22/2017   Macular degeneration    Nausea and vomiting 08/28/2022   Rheumatoid aortitis    Sleep apnea    does not use cpap - UNABLE TO TOLERATE MASK   Sleep apnea in adult    deviated septum repaired, most recent sleep study was negative   Status post total right knee replacement 06/01/2015   Past Surgical History:  Procedure Laterality Date   ANKLE FUSION Right 05/05/2012   related to his arthritis   ANKLE FUSION Left 05/25/2013   related to his arthritis   ANKLE SURGERY Bilateral    APPLICATION OF CRANIAL NAVIGATION N/A 04/11/2022   Procedure: APPLICATION OF CRANIAL NAVIGATION;  Surgeon: Lisbeth Renshaw, MD;  Location: [facility] OR;  Service: Neurosurgery;  Laterality: N/A;   APPLICATION OF CRANIAL NAVIGATION N/A 08/08/2022   Procedure: APPLICATION OF  CRANIAL NAVIGATION;  Surgeon: Lisbeth Renshaw, MD;  Location: [facility] OR;  Service: Neurosurgery;  Laterality: N/A;   CARDIAC CATHETERIZATION N/A 05/01/2015   Procedure: Left Heart Cath and Coronary Angiography;  Surgeon: Lyn Records, MD;  Location: [facility] INVASIVE CV LAB;  Service: Cardiovascular;  Laterality: N/A;   CRANIOTOMY N/A 04/11/2022   Procedure: STEREOTACTIC SUBOCCIPITAL CRANIOTOMY FOR RESECTION OF ARTERIO-VENOUS MALFORMATION;  Surgeon: Lisbeth Renshaw, MD;  Location: [facility] OR;  Service: Neurosurgery;  Laterality: N/A;   FRACTURE SURGERY     left femur fracture x 3   IR ANGIO EXTERNAL CAROTID SEL EXT CAROTID BILAT MOD SED  01/28/2022   IR ANGIO INTRA EXTRACRAN SEL INTERNAL CAROTID BILAT MOD SED  01/28/2022   IR ANGIO INTRA EXTRACRAN SEL INTERNAL CAROTID BILAT MOD SED  04/16/2022   IR ANGIO VERTEBRAL SEL VERTEBRAL BILAT MOD SED  01/28/2022   IR ANGIO VERTEBRAL SEL VERTEBRAL BILAT MOD SED  04/16/2022   KNEE ARTHROSCOPY     right x 4   LAPAROSCOPIC REVISION VENTRICULAR-PERITONEAL (V-P) SHUNT N/A 08/08/2022   Procedure: LAPAROSCOPIC VENTRICULAR-PERITONEAL (V-P) SHUNT;  Surgeon: Harriette Bouillon, MD;  Location: [facility] OR;  Service: General;  Laterality: N/A;   LUMBAR LAMINECTOMY/DECOMPRESSION MICRODISCECTOMY N/A 07/18/2022   Procedure: Repair of Pseudomeningiocele posterior;  Surgeon: Lisbeth Renshaw, MD;  Location: [facility] OR;  Service: Neurosurgery;  Laterality: N/A;   NASAL SEPTOPLASTY W/ TURBINOPLASTY  06/25/2011   Procedure: NASAL SEPTOPLASTY WITH TURBINATE REDUCTION;  Surgeon: Osborn Coho, MD;  Location: [facility] OR;  Service: ENT;  Laterality: Bilateral;   PLACEMENT OF LUMBAR DRAIN N/A 07/18/2022   Procedure: PLACEMENT OF LUMBAR DRAIN;  Surgeon: Lisbeth Renshaw, MD;  Location: [facility] OR;  Service: Neurosurgery;  Laterality: N/A;   TONSILLECTOMY     TOTAL KNEE ARTHROPLASTY Right 06/01/2015   Procedure: RIGHT TOTAL KNEE ARTHROPLASTY;  Surgeon: Kathryne Hitch, MD;  Location: [facility] ORS;  Service:  Orthopedics;  Laterality: Right;  Block+general   VENTRICULOPERITONEAL SHUNT Right 08/08/2022   Procedure: LAP ASSITED SHUNT INSERTION VENTRICULAR-PERITONEAL;  Surgeon: Lisbeth Renshaw, MD;  Location: [facility] OR;  Service: Neurosurgery;  Laterality: Right;   Patient Active Problem List   Diagnosis Date Noted   Nausea and vomiting 08/28/2022   Adjustment disorder 08/06/2022   Protein-calorie malnutrition, severe 07/31/2022   Cerebellar stroke (HCC) 07/29/2022   Ischemic cerebrovascular accident (CVA) (HCC) 07/28/2022   Prolonged QT interval 07/28/2022   Pseudomeningocele 07/18/2022   Pseudomeningocele due to surgical procedure 07/18/2022   Status post craniotomy 04/11/2022   AVM (arteriovenous malformation) brain 04/11/2022   Impingement syndrome of left shoulder 12/22/2017   Impingement syndrome of right shoulder 12/22/2017   Rheumatoid arthritis involving right knee (HCC) 06/01/2015   Status post total right knee replacement 06/01/2015   Cardiomyopathy (HCC) 05/02/2015   Hyperlipidemia    Sleep apnea in adult    Rheumatoid arthritis (HCC)    Chronic systolic heart failure (HCC) 05/01/2015   CAD (coronary artery disease), native coronary artery     ONSET DATE: 08/21/2022 (referral date)  REFERRING DIAG: I63.9 (ICD-10-CM) - Cerebral infarction, unspecified  THERAPY DIAG:  Muscle weakness (generalized)  Other lack of coordination  Unsteadiness on feet  Rationale for Evaluation and Treatment: Rehabilitation  SUBJECTIVE:                                                                                                                                                                                             SUBJECTIVE STATEMENT: Patient received via handoff from ST.  He brought his [facility], but he and wife inquire about transitioning to no AD.  He denies falls or near misses.  He states he has new speaking skills to practice for ST.  His PT HEP is going fine.  Pt accompanied by: self  and significant other - wife Havok Gellerman  PERTINENT HISTORY: cerebellar AVM resection 12/23, pseudomeningocele s/p craniotomy 3/24, acute R cerebellar infarct, hydrocephalus and VP shunt was placed on 08/08/22, dysphagia, dysarthria, aphasia  PMH:  rheumatoid arthritis, bilateral ankle fusion (2014 and 2015), cardiomyopathy, CAD, R TKA, cardiomyopathy, chronic systolic heart failure, macular degeneration, OSA on CPAP  PAIN:  Are you having pain? No  PRECAUTIONS: Fall and Other: swallow precautions with modification with left head tilt and supervision if drinking thinned liquids, per caregiver not to lift, history of orthostatic hypotension resulting in falls  PATIENT GOALS: "To get back to playing golf and moving like I used to."  OBJECTIVE:   DIAGNOSTIC FINDINGS:   CT on 08/27/2022: IMPRESSION: 1. No evidence of acute infarct or hemorrhage. 2. Stable right-sided ventriculostomy catheter, tip within the right lateral ventricle. Stable size of the lateral ventricles without hydrocephalus. 3. Postsurgical changes from prior suboccipital craniectomy, with underlying encephalomalacia in the cerebellum. The pseudomeningocele seen on prior study has increased slightly in size since prior exam, of uncertain significance. 4. Decreased size of the right superior cerebellar intraparenchymal cyst, with decreased mass effect upon the fourth ventricle.  MR Brain on 07/28/2022: IMPRESSION: 1. New small focus of acute infarction in the right superior cerebellar hemisphere with surrounding area of increased T2 signal and cystic change, which is new since the prior MRI dated 07/05/2022 and suspicious for now chronic infarct. 2. Postoperative changes of prior left posterior fossa resection. Redemonstrated foci of gas within the resection cavity, fourth ventricle and extra-axial spaces, likely secondary to recent pseudomeningocele repair and/or lumbar drain placement/removal. 3. Decreased size of  the fluid collection in the dorsal soft tissues overlying the suboccipital craniectomy.  TODAY'S TREATMENT:                                                                                                                              -Assessed vitals: Today's Vitals   10/02/22 0944  BP: 115/80  Pulse: (!) 105  SpO2: 96%   GAIT: Gait pattern: step through pattern, decreased arm swing- Right, decreased arm swing- Left, narrow BOS, and poor foot clearance- Right Distance walked: 200' + 200' Assistive device utilized:  carried [facility] w/o use and None Level of assistance: CGA Comments: Pt ambulates over level indoor gym surface.  He demonstrates some right drift on straight hallway path and crossover step on left turn x1 requiring stop and reset which PT encouraged.  Edu on correcting BOS and slowing down with turns-improvement on second bout.  Discussed hallway setup in his home that he could use to practice w/ CGA from wife.  Education on using visual external target (door at end of hall, etc) to help maintain pathway.  -Forward and backward walking no UE support CGA 2x10' each > stop and go w/ direction changing  -Lateral stepping no UE support 4x10' -Forward walking w/ head turns cued to prevent diving in the direction of head turn 8x10'; pt retains prior instructions on correcting BOS and stop and reset with LOB -Pt ambulates CGA in grass x55' + 60' over unlevel sidewalk, he holds cane without active use, cues to decrease pace and maintain  proper width of BOS (more narrowing noticed on grass, but improved foot clearance) -PT provides supervision for pt getting into SUV with wife  PATIENT EDUCATION: Education details:  Trialing walking in home w/o AD on straight pathway with wife holding onto him.  Use cane in all other scenarios at current.  Bring a golf club to next visit for practice swinging and incorporating golf into balance as pt does not wish to use golf clubs available in clinic. Person  educated: Patient and Spouse Education method: Medical illustrator Education comprehension: verbalized understanding and needs further education  HOME EXERCISE PROGRAM: You Can Walk For A Certain Length Of Time Each Day                          Walk 2 minutes 1 times per day.             Increase 2  minutes every 3 days              Work up to 30 minutes (1-2 times per day).               Example:                         Day 1-2           4-5 minutes     3 times per day                         Day 7-8           10-12 minutes 2-3 times per day                         Day 13-14       20-22 minutes 1-2 times per day  Oculomotor: Saccades    Holding two targets positioned side by side __6__ inches apart, move eyes quickly from target to target as head stays still.  Perform sitting. Repeat __10__ times per session. Do __3__ sessions per day.  Access Code: [email or web address] URL: [email or web address] Date: 09/12/2022 Prepared by: Camille Bal  Exercises - Pencil Pushups  - 1 x daily - 5 x weekly - 3 sets - 5 reps - Corner Balance Feet Together With Eyes Closed  - 1 x daily - 5 x weekly - 1 sets - 3-4 reps - 30-45 seconds hold - Standing Tandem Balance with Counter Support  - 1 x daily - 5 x weekly - 1 sets - 2-3 reps - 45 seconds hold - Tandem Walking with Counter Support  - 1 x daily - 5 x weekly - 3 sets - 10 reps - Backward Walking with Counter Support  - 1 x daily - 5 x weekly - 3 sets - 10 reps - Walking with Head Rotation  - 1 x daily - 5 x weekly - 3 sets - 10 reps  GOALS: Goals reviewed with patient? Yes  SHORT TERM GOALS: Target date: 09/26/2022  Patient will demonstrate 100% compliance with initial HEP to continue to progress between physical therapy sessions.   Baseline: Pt is intermittently compliant (5/23) Goal status: IN PROGRESS  2.  Patient will improve their 5x Sit to Stand score to less than 18 seconds to demonstrate a decreased risk for  falls and improved LE strength.   Baseline: 22.45" without UE support/unsteady WBOS; 19.27 seconds, pt is steady (  5/23) Goal status: IN PROGRESS  3.  Patient will improve TUG score to 17 seconds or less with LRAD to indicate a decreased risk of falls and demonstrate improved overall mobility.  Baseline: 21.27" with RW; 12.97 seconds w/ SPC (5/23) Goal status: MET  4.  Patient will improve gait speed to 0.87 m/s to indicate improvement to the level of community ambulator in order to participate more easily in activities outside of the home such as adapted golf.   Baseline: 0.77 m/s; 0.95 m/sec (5/23) Goal status: MET  5. Patient will improve BBS score to >/= 38/56 to demonstrate improved balance Baseline: 29/56; 40/56 (5/23) Goal status: MET  LONG TERM GOALS: Target date: 10/24/2022  Patient will report demonstrate independence with final HEP in order to maintain current gains and continue to progress after physical therapy discharge.   Baseline: To be provided Goal status: INITIAL  2.   Patient will improve their 5x Sit to Stand score to 15 seconds to demonstrate a decreased risk for falls and improved LE strength.   Baseline: 22.45" without UE support/unsteady WBOS Goal status: INITIAL  3.  Patient will improve TUG score to 15 seconds or less with LRAD to indicate a decreased risk of falls and demonstrate improved overall mobility.  Baseline: 21.27" with RW; 12.97 seconds w/ SPC (5/23) Goal status: REVISED-MET 5/23  4.  Patient will improve gait speed to 1.0 m/s to indicate improvement to the level of community ambulator in order to participate more easily in activities outside of the home such as adapted golf.   Baseline: 0.77 m/s Goal status: INITIAL  5.  Patient will improve FOTO score to 57 to achieve predicted improvements in functional mobility due to skilled physical therapy interventions to increase safety with and participation in daily activities. Baseline: 49 Goal  status: INITIAL  6.  Patient will improve BBS score to >/= 47/56 to demonstrate improved balance Baseline: 29/56 Goal status: INITIAL  ASSESSMENT:  CLINICAL IMPRESSION: Emphasis of skilled session today on progressing patient to practice ambulation in home without an AD as well as over compliant outdoor surfaces.  He continues to have delayed righting reactions, but does well with stepping strategies when needed.  His trunk remains stiff during most mobility and he has been given tools to address this and gait deviations during practice at home.  PT to continue per POC as patient is making overall progress towards mobility goals.  OBJECTIVE IMPAIRMENTS: Abnormal gait, cardiopulmonary status limiting activity, decreased activity tolerance, decreased balance, decreased cognition, decreased coordination, decreased endurance, decreased knowledge of condition, decreased mobility, difficulty walking, decreased ROM, decreased strength, and impaired tone.   ACTIVITY LIMITATIONS: carrying, lifting, bending, sitting, standing, squatting, stairs, transfers, reach over head, locomotion level, and caring for others  PARTICIPATION LIMITATIONS: cleaning, driving, shopping, community activity, occupation, and yard work, Systems analyst  PERSONAL FACTORS: Past/current experiences and 3+ comorbidities: see above  are also affecting patient's functional outcome, reliance on others for transportation   REHAB POTENTIAL: Good - may be limited by complex medical history  CLINICAL DECISION MAKING: Evolving/moderate complexity  EVALUATION COMPLEXITY: Moderate  PLAN:  PT FREQUENCY: 2x/week (dropped to 1x/wk on 5/23 per pt request)  PT DURATION: 8 weeks  PLANNED INTERVENTIONS: Therapeutic exercises, Therapeutic activity, Neuromuscular re-education, Balance training, Gait training, Patient/Family education, Self Care, Joint mobilization, Stair training, Vestibular training, DME instructions, Wheelchair mobility training,  Manual therapy, and Re-evaluation  PLAN FOR NEXT SESSION: monitor vitals/orthostatic hypotension, add to HEP for balance prn, high intensity gait training,  integrate golfing concepts as able, add in simple dual task to gait with SPC, vision exercises, dynamic balance, fluidity of movement, coordination tasks/reciprocal mobility/arm swing-golf club swing?  Sadie Haber, PT, DPT 10/02/2022, 10:14 AM

## 2022-10-02 NOTE — Patient Instructions (Signed)
To avoid talking about topics you prefer not to talk about...  "I'd rather not talk about me" Redirect the conversation to a topic you DO want to talk about  [employer]   The news

## 2022-10-02 NOTE — Therapy (Signed)
OUTPATIENT SPEECH LANGUAGE PATHOLOGY TREATMENT NOTE   Patient Name: Christopher Yu MRN: [id number] DOB:March 04, 1961, 62 y.o., male Today's Date: 10/02/2022  PCP: Thana Ates, MD REFERRING PROVIDER: Charlton Amor, PA-C   END OF SESSION:   End of Session - 10/02/22 0847     Visit Number 8    Number of Visits 17    Date for SLP Re-Evaluation 10/24/22    Authorization Type [employer]    SLP Start Time 0847    SLP Stop Time  0932    SLP Time Calculation (min) 45 min    Activity Tolerance Patient tolerated treatment well   RA flare in bilateral shoulders                Past Medical History:  Diagnosis Date   Anxiety    Arthritis, rheumatoid (HCC)    dx 1988   CAD (coronary artery disease), native coronary artery    Mild LAD disease with calcification noted at cath 12//27/16    Cardiomyopathy (HCC)    Cardiomyopathy (HCC)    Chronic systolic heart failure (HCC) 05/01/2015   Depression    Deviated nasal septum 06/25/2011   Headache    High cholesterol    Hyperlipidemia    Impingement syndrome of left shoulder 12/22/2017   Macular degeneration    Nausea and vomiting 08/28/2022   Rheumatoid aortitis    Sleep apnea    does not use cpap - UNABLE TO TOLERATE MASK   Sleep apnea in adult    deviated septum repaired, most recent sleep study was negative   Status post total right knee replacement 06/01/2015   Past Surgical History:  Procedure Laterality Date   ANKLE FUSION Right 05/05/2012   related to his arthritis   ANKLE FUSION Left 05/25/2013   related to his arthritis   ANKLE SURGERY Bilateral    APPLICATION OF CRANIAL NAVIGATION N/A 04/11/2022   Procedure: APPLICATION OF CRANIAL NAVIGATION;  Surgeon: Lisbeth Renshaw, MD;  Location: [facility] OR;  Service: Neurosurgery;  Laterality: N/A;   APPLICATION OF CRANIAL NAVIGATION N/A 08/08/2022   Procedure: APPLICATION OF CRANIAL NAVIGATION;  Surgeon: Lisbeth Renshaw, MD;  Location: [facility] OR;  Service:  Neurosurgery;  Laterality: N/A;   CARDIAC CATHETERIZATION N/A 05/01/2015   Procedure: Left Heart Cath and Coronary Angiography;  Surgeon: Lyn Records, MD;  Location: [facility] INVASIVE CV LAB;  Service: Cardiovascular;  Laterality: N/A;   CRANIOTOMY N/A 04/11/2022   Procedure: STEREOTACTIC SUBOCCIPITAL CRANIOTOMY FOR RESECTION OF ARTERIO-VENOUS MALFORMATION;  Surgeon: Lisbeth Renshaw, MD;  Location: [facility] OR;  Service: Neurosurgery;  Laterality: N/A;   FRACTURE SURGERY     left femur fracture x 3   IR ANGIO EXTERNAL CAROTID SEL EXT CAROTID BILAT MOD SED  01/28/2022   IR ANGIO INTRA EXTRACRAN SEL INTERNAL CAROTID BILAT MOD SED  01/28/2022   IR ANGIO INTRA EXTRACRAN SEL INTERNAL CAROTID BILAT MOD SED  04/16/2022   IR ANGIO VERTEBRAL SEL VERTEBRAL BILAT MOD SED  01/28/2022   IR ANGIO VERTEBRAL SEL VERTEBRAL BILAT MOD SED  04/16/2022   KNEE ARTHROSCOPY     right x 4   LAPAROSCOPIC REVISION VENTRICULAR-PERITONEAL (V-P) SHUNT N/A 08/08/2022   Procedure: LAPAROSCOPIC VENTRICULAR-PERITONEAL (V-P) SHUNT;  Surgeon: Harriette Bouillon, MD;  Location: [facility] OR;  Service: General;  Laterality: N/A;   LUMBAR LAMINECTOMY/DECOMPRESSION MICRODISCECTOMY N/A 07/18/2022   Procedure: Repair of Pseudomeningiocele posterior;  Surgeon: Lisbeth Renshaw, MD;  Location: [facility] OR;  Service: Neurosurgery;  Laterality: N/A;   NASAL SEPTOPLASTY W/ TURBINOPLASTY  06/25/2011   Procedure: NASAL SEPTOPLASTY WITH TURBINATE REDUCTION;  Surgeon: Osborn Coho, MD;  Location: [facility] OR;  Service: ENT;  Laterality: Bilateral;   PLACEMENT OF LUMBAR DRAIN N/A 07/18/2022   Procedure: PLACEMENT OF LUMBAR DRAIN;  Surgeon: Lisbeth Renshaw, MD;  Location: [facility] OR;  Service: Neurosurgery;  Laterality: N/A;   TONSILLECTOMY     TOTAL KNEE ARTHROPLASTY Right 06/01/2015   Procedure: RIGHT TOTAL KNEE ARTHROPLASTY;  Surgeon: Kathryne Hitch, MD;  Location: [facility] ORS;  Service: Orthopedics;  Laterality: Right;  Block+general   VENTRICULOPERITONEAL SHUNT  Right 08/08/2022   Procedure: LAP ASSITED SHUNT INSERTION VENTRICULAR-PERITONEAL;  Surgeon: Lisbeth Renshaw, MD;  Location: [facility] OR;  Service: Neurosurgery;  Laterality: Right;   Patient Active Problem List   Diagnosis Date Noted   Nausea and vomiting 08/28/2022   Adjustment disorder 08/06/2022   Protein-calorie malnutrition, severe 07/31/2022   Cerebellar stroke (HCC) 07/29/2022   Ischemic cerebrovascular accident (CVA) (HCC) 07/28/2022   Prolonged QT interval 07/28/2022   Pseudomeningocele 07/18/2022   Pseudomeningocele due to surgical procedure 07/18/2022   Status post craniotomy 04/11/2022   AVM (arteriovenous malformation) brain 04/11/2022   Impingement syndrome of left shoulder 12/22/2017   Impingement syndrome of right shoulder 12/22/2017   Rheumatoid arthritis involving right knee (HCC) 06/01/2015   Status post total right knee replacement 06/01/2015   Cardiomyopathy (HCC) 05/02/2015   Hyperlipidemia    Sleep apnea in adult    Rheumatoid arthritis (HCC)    Chronic systolic heart failure (HCC) 05/01/2015   CAD (coronary artery disease), native coronary artery     ONSET DATE: 07/28/22  REFERRING DIAG:  I63.9 (ICD-10-CM) - Cerebral infarction, unspecified   THERAPY DIAG:  Dysarthria and anarthria  Rationale for Evaluation and Treatment: Rehabilitation  SUBJECTIVE:   SUBJECTIVE STATEMENT: "its going."   PAIN:  Are you having pain? Yes NPRS scale: 8/10 Pain location: right shoulder Pain orientation: Right  PAIN TYPE: aching Pain description: constant  Aggravating factors: movement Relieving factors: rest   OBJECTIVE:   TODAY'S TREATMENT:                                                                                                                                         DATE:   10/02/22: Targeted conversation topics to practice increasing conversation length and use of dysarthria strategies during conversational speech. Led through debate topics to work on  mental flexibility with mod-A of models and cues. Patient demonstrated mental rigidity and required max cues to consider additional perspectives and complete x1 scenario. Spouse mentions that this has been consistent in the past, but that patient is more blunt since the stroke. Led through safe conversation topics to discuss during the party including, education, family. Role play to work on Mudlogger language for future party. HEP sent of ways to avoid un-preferred conversation topics.   09/30/22: Patient has a graduation party this weekend. He plans on avoiding  people and not talking. Spouse, and SLP encouraged him to speak more and to plan topics beforehand. Led through answering questions and problem solving. Was able to speak in 30 minute conversation with min-A, and encouragement. Intelligibility at 90%, with occasional collapse of longer words, however, patient was able to clear throat and self correct. Voice sounded less groggy/wet when compared to last week. Patient spoke more with desired topics, ie golf, politics between [place] and [place]    09/25/22: Led through cognitive exercises to state similarities and differences between items with min-A. Patient carried over slow rate of speech from structured task to conversational speech through telling of a story of friends. Speech clear, with min-cuing. Pt states that speech sounds clear when asked to self monitor. Unable to do HEP sent home 5/20 due to graduation. States that graduation required lots of talking and that he feels like it went well. SLP noted voice quality to be wet and groggy, that did not diminish with throat clear and water, however this did not decrease intelligibility.    5/20/24[name] with limited completion of HEP at home as they had company from out of town. He completed HEP using slow rate, over articulation repeating multi-syllabic words and generating sentence with the word with occasional min verbal cues and modeling. Speech  today more slurred than last session - suspect fatigue due to company this weekend. In structured task generating 3 sentence salient description of vegetables, Sharief carried over slow rate and over articulation requiring 2 verbal cues to ID unintelligible words (out of 15 sentences) and to repeat intelligibly. In repetition task (due to reduced vision) Rorke stated 4 words and Id'd the item that did not belong and explained way using intelligible speech 10/10x. In conversation re: grilling/cooking, he maintained intelligible speech in this quiet environment, however as noted before, speech with increased imprecision and slur today. Sequencing homework provided - family will need to read this to him  09/18/22: Pt reports ongoing daily completion of effortful swallow to address dysphagia. Subjective improvement of swallow function per pt report with reduced instances of coughing with liquids and no longer using thickened liquids. In structured practice targeting tri-syllabic words at generative sentence level, pt with 100% accuracy. Practice naturally occurring in conversational speech results in ~70% accuracy of 3+ syllable words with increased challenges endorsed by pt. Barrier to communication at home is pt resistant to repeating himself. Education provided regarding increased complexity of using strategies in conversation to encourage pt to give himself grace when breakdowns occur. Add oral reading to HEP, if able d/t vision concerns related to dry eyes.   09/15/22: Reviewed HEP for dysphagia - Ricco demonstrated tongue press , 5 second hold with hard swallow 7/7x with rare min A. He is not completing chin tuck against resistance or Shaker's due to pain at incision site. He is completing tongue press hard swallow regularly during the day. [name] completed HEP for dysarthria with rare min A carrying over compensations and over articulation. In structured task repeating and generating sentences with  multi-syllabic words, Lokesh generated 10/12 sentences intelligibly - requiring repetition on 2/10. Sentences vague and tangential at times, for example, the word representative he stated "A representative is coming to my house on Friday" Questioning and verbal cues revealed that his family from [place] are coming to visit on Friday. Gurtej also endorses difficulty processing what he wants to say. Targeted processing and carryover of dysarthria strategies generating 3 sentence description of basic words Kaydn gave me the  clues "my daughters don't take this to school" or "this is what I want to eat" He required ongoing verbal A to generate salient descriptions for his listener, me who doesn't know him personally. Provided home work for this task as well. He plans on attending an award ceremony and graduation for his daughter. Education provided re: strategies and awareness of sensory overload as Noal reports he frustrates easily and generated strategy of yelling at the person who " was annoying me" - Verbal cue and education to use strategy of telling [name] he may need to leave or go to a more quiet spot.  09/10/22: Initiated HEP for dysarthria - see patient instructions - Initiated training in compensations for dysarthria (slow, loud. Over articulation and pause) With cues to open mouth wide, make each sound distinct, Henos completed sentences intelligibly 18/20.  Using compensations in structured task, generating 3 sentence description of basic object, Robbin carried over strategies to be intelligible 10/12 sentences with consistent modeling and occasional min verbal cues. Repetition required 2/12 sentences with mod I at correcting errors to intelligible speech. In simple conversation he maintained intelligible speech with rare min A over 4 minutes. Kyus required verbal cue to look right with 2 swallows when he took tylenol  08/29/22: updated HEP. Direct instruction for swallow exercises with pt able to confirm  understanding via demonstration with min-A.      PATIENT EDUCATION: Education details: POC, goals, evaluation results/recommendations Person educated: Patient and Spouse Education method: Explanation, Demonstration, Verbal cues, and Handouts Education comprehension: verbalized understanding, returned demonstration, and needs further education     GOALS: Goals reviewed with patient? Yes   SHORT TERM GOALS: Target date: 10/03/2022   Pt will teach back dysarthria strategies and compensations with mod-I Baseline: Goal status: ONGOING   2.   Pt will employ dysarthria strategies during structured practice activities resulting in intelligible production at mult-sentence level in 80% of trials Baseline:  Goal status: ONGOING   3. Pt will demonstrate use of swallow compensations/strategies with PO trials with mod-I over 2 sessions  Baseline:  Goal status: ONGOING   4.  Pt will complete HEP 6/7 days over 1 week period with mod-I Baseline:  Goal status: ONGOING     LONG TERM GOALS: Target date: 10/24/2022   Pt will complete repeat MBSS Baseline:  Goal status: ONGOING   2.  Pt will carryover dysarthria strategies/compensations during 20 minute conversation resulting in 90% ineligibility  Baseline:  Goal status: ONGOING   3.  Pt and spouse will report subjective report of reduced need for requests for repetition by 50% at home, over 1 week period Baseline:  Goal status:ONGOING   4.  Pt will report improvement via PROM by dc  Baseline:  Goal status: ONGOING     ASSESSMENT:   CLINICAL IMPRESSION: Patient is a 62 y.o. M who was seen today for dysphagia and motor speech eval s/p cerebellar stroke. Evaluation reveals moderate dysarthria and recent MBSS demonstrates mild pharyngeal dysphagia. Pt's speech is c/b irregular imprecise articulation and distortions, volume decay on expanded utterances, and reduced use of prosody. Pt reports frustration regarding his communicative  abilities, tells SLP his vocation has high level of demands for communication and he feels he will "never get back there, it's impossible." As evaluation progressed, pt with increased frustration with SLP providing education to encourage participation and positive mindset for engagement in therapy course. Brief introduction to dysarthria strategies initiated. Reviewed instrumental swallow study results and updated pt's dysphagia HEP to accommodate  preferences while targeting demonstrated deficits. Will plan to request repeat MBSS following usual completion of dysphagia HEP over 1-2 month period. Pt would benefit from skilled ST to address aforementioned deficits to improve QoL, enhance communication efficacy, and facilitate improved swallow function/safety .      OBJECTIVE IMPAIRMENTS: include dysarthria and dysphagia. These impairments are limiting patient from return to work, effectively communicating at home and in community, and safety when swallowing. Factors affecting potential to achieve goals and functional outcome are cooperation/participation level. Patient will benefit from skilled SLP services to address above impairments and improve overall function.   REHAB POTENTIAL: Good   PLAN:   SLP FREQUENCY: 2x/week   SLP DURATION: 8 weeks   PLANNED INTERVENTIONS: Aspiration precaution training, Pharyngeal strengthening exercises, Diet toleration management , Trials of upgraded texture/liquids, Cueing hierachy, Internal/external aids, Functional tasks, Multimodal communication approach, SLP instruction and feedback, Compensatory strategies, Patient/family education, and Re-evaluation     [place], Student-SLP 10/02/2022, 8:47 AM

## 2022-10-06 ENCOUNTER — Ambulatory Visit: Payer: BC Managed Care – PPO

## 2022-10-06 ENCOUNTER — Ambulatory Visit: Payer: BC Managed Care – PPO | Admitting: Speech Pathology

## 2022-10-06 ENCOUNTER — Ambulatory Visit: Payer: BC Managed Care – PPO | Attending: Neurosurgery

## 2022-10-06 ENCOUNTER — Encounter: Payer: Self-pay | Admitting: Speech Pathology

## 2022-10-06 DIAGNOSIS — M6281 Muscle weakness (generalized): Secondary | ICD-10-CM | POA: Insufficient documentation

## 2022-10-06 DIAGNOSIS — R2681 Unsteadiness on feet: Secondary | ICD-10-CM | POA: Diagnosis not present

## 2022-10-06 DIAGNOSIS — R41840 Attention and concentration deficit: Secondary | ICD-10-CM | POA: Diagnosis not present

## 2022-10-06 DIAGNOSIS — R471 Dysarthria and anarthria: Secondary | ICD-10-CM | POA: Diagnosis not present

## 2022-10-06 DIAGNOSIS — R278 Other lack of coordination: Secondary | ICD-10-CM | POA: Diagnosis not present

## 2022-10-06 DIAGNOSIS — R1310 Dysphagia, unspecified: Secondary | ICD-10-CM | POA: Diagnosis not present

## 2022-10-06 DIAGNOSIS — R41842 Visuospatial deficit: Secondary | ICD-10-CM | POA: Diagnosis not present

## 2022-10-06 DIAGNOSIS — R42 Dizziness and giddiness: Secondary | ICD-10-CM | POA: Diagnosis not present

## 2022-10-06 DIAGNOSIS — R269 Unspecified abnormalities of gait and mobility: Secondary | ICD-10-CM | POA: Insufficient documentation

## 2022-10-06 NOTE — Patient Instructions (Addendum)
       You have been issued an Expiratory Respiratory Muscle Trainer to increase lung strength for improved speech and/or swallowing abilities. Please follow the instructions below provided by your Speech-Language Pathologist to complete your exercises.     Expiratory Muscle Training Blue Device-"Blue equals blow" Perform __5 blows, 5__  repetitions, ___3____ times per day. Device set at:  __65______ cm H2O    Breathe in, then short hard blow into the device - should sound like a bike pump release      Take a break or discontinue use of you experience lightheadedness, dizziness, pain, shortness of breath, color change, or excessive sweating. Any questions, call Baldo Ash or Vernona Rieger at [phone]

## 2022-10-06 NOTE — Patient Instructions (Signed)
   [email or web address]

## 2022-10-06 NOTE — Therapy (Signed)
OUTPATIENT OCCUPATIONAL THERAPY NEURO TREATMENT AND PROGRESS NOTE  Patient Name: Christopher Yu MRN: [id number] DOB:23-Sep-1960, 62 y.o., male Today's Date: 10/06/2022  PCP: Thana Ates, MD REFERRING PROVIDER: Charlton Amor, PA-C  END OF SESSION:  OT End of Session - 10/06/22 1100     Visit Number 10    Number of Visits 20    Date for OT Re-Evaluation 06/20/22    Authorization Type [employer]    OT Start Time 1103    OT Stop Time 1145    OT Time Calculation (min) 42 min    Equipment Utilized During Treatment Single-point cane    Activity Tolerance Patient tolerated treatment well    Behavior During Therapy WFL for tasks assessed/performed;Flat affect             Past Medical History:  Diagnosis Date   Anxiety    Arthritis, rheumatoid (HCC)    dx 1988   CAD (coronary artery disease), native coronary artery    Mild LAD disease with calcification noted at cath 12//27/16    Cardiomyopathy (HCC)    Cardiomyopathy (HCC)    Chronic systolic heart failure (HCC) 05/01/2015   Depression    Deviated nasal septum 06/25/2011   Headache    High cholesterol    Hyperlipidemia    Impingement syndrome of left shoulder 12/22/2017   Macular degeneration    Nausea and vomiting 08/28/2022   Rheumatoid aortitis    Sleep apnea    does not use cpap - UNABLE TO TOLERATE MASK   Sleep apnea in adult    deviated septum repaired, most recent sleep study was negative   Status post total right knee replacement 06/01/2015   Past Surgical History:  Procedure Laterality Date   ANKLE FUSION Right 05/05/2012   related to his arthritis   ANKLE FUSION Left 05/25/2013   related to his arthritis   ANKLE SURGERY Bilateral    APPLICATION OF CRANIAL NAVIGATION N/A 04/11/2022   Procedure: APPLICATION OF CRANIAL NAVIGATION;  Surgeon: Lisbeth Renshaw, MD;  Location: [facility] OR;  Service: Neurosurgery;  Laterality: N/A;   APPLICATION OF CRANIAL NAVIGATION N/A 08/08/2022   Procedure:  APPLICATION OF CRANIAL NAVIGATION;  Surgeon: Lisbeth Renshaw, MD;  Location: [facility] OR;  Service: Neurosurgery;  Laterality: N/A;   CARDIAC CATHETERIZATION N/A 05/01/2015   Procedure: Left Heart Cath and Coronary Angiography;  Surgeon: Lyn Records, MD;  Location: [facility] INVASIVE CV LAB;  Service: Cardiovascular;  Laterality: N/A;   CRANIOTOMY N/A 04/11/2022   Procedure: STEREOTACTIC SUBOCCIPITAL CRANIOTOMY FOR RESECTION OF ARTERIO-VENOUS MALFORMATION;  Surgeon: Lisbeth Renshaw, MD;  Location: [facility] OR;  Service: Neurosurgery;  Laterality: N/A;   FRACTURE SURGERY     left femur fracture x 3   IR ANGIO EXTERNAL CAROTID SEL EXT CAROTID BILAT MOD SED  01/28/2022   IR ANGIO INTRA EXTRACRAN SEL INTERNAL CAROTID BILAT MOD SED  01/28/2022   IR ANGIO INTRA EXTRACRAN SEL INTERNAL CAROTID BILAT MOD SED  04/16/2022   IR ANGIO VERTEBRAL SEL VERTEBRAL BILAT MOD SED  01/28/2022   IR ANGIO VERTEBRAL SEL VERTEBRAL BILAT MOD SED  04/16/2022   KNEE ARTHROSCOPY     right x 4   LAPAROSCOPIC REVISION VENTRICULAR-PERITONEAL (V-P) SHUNT N/A 08/08/2022   Procedure: LAPAROSCOPIC VENTRICULAR-PERITONEAL (V-P) SHUNT;  Surgeon: Harriette Bouillon, MD;  Location: [facility] OR;  Service: General;  Laterality: N/A;   LUMBAR LAMINECTOMY/DECOMPRESSION MICRODISCECTOMY N/A 07/18/2022   Procedure: Repair of Pseudomeningiocele posterior;  Surgeon: Lisbeth Renshaw, MD;  Location: [facility] OR;  Service: Neurosurgery;  Laterality: N/A;   NASAL SEPTOPLASTY W/ TURBINOPLASTY  06/25/2011   Procedure: NASAL SEPTOPLASTY WITH TURBINATE REDUCTION;  Surgeon: Osborn Coho, MD;  Location: [facility] OR;  Service: ENT;  Laterality: Bilateral;   PLACEMENT OF LUMBAR DRAIN N/A 07/18/2022   Procedure: PLACEMENT OF LUMBAR DRAIN;  Surgeon: Lisbeth Renshaw, MD;  Location: [facility] OR;  Service: Neurosurgery;  Laterality: N/A;   TONSILLECTOMY     TOTAL KNEE ARTHROPLASTY Right 06/01/2015   Procedure: RIGHT TOTAL KNEE ARTHROPLASTY;  Surgeon: Kathryne Hitch, MD;  Location: [facility]  ORS;  Service: Orthopedics;  Laterality: Right;  Block+general   VENTRICULOPERITONEAL SHUNT Right 08/08/2022   Procedure: LAP ASSITED SHUNT INSERTION VENTRICULAR-PERITONEAL;  Surgeon: Lisbeth Renshaw, MD;  Location: [facility] OR;  Service: Neurosurgery;  Laterality: Right;   Patient Active Problem List   Diagnosis Date Noted   Nausea and vomiting 08/28/2022   Adjustment disorder 08/06/2022   Protein-calorie malnutrition, severe 07/31/2022   Cerebellar stroke (HCC) 07/29/2022   Ischemic cerebrovascular accident (CVA) (HCC) 07/28/2022   Prolonged QT interval 07/28/2022   Pseudomeningocele 07/18/2022   Pseudomeningocele due to surgical procedure 07/18/2022   Status post craniotomy 04/11/2022   AVM (arteriovenous malformation) brain 04/11/2022   Impingement syndrome of left shoulder 12/22/2017   Impingement syndrome of right shoulder 12/22/2017   Rheumatoid arthritis involving right knee (HCC) 06/01/2015   Status post total right knee replacement 06/01/2015   Cardiomyopathy (HCC) 05/02/2015   Hyperlipidemia    Sleep apnea in adult    Rheumatoid arthritis (HCC)    Chronic systolic heart failure (HCC) 05/01/2015   CAD (coronary artery disease), native coronary artery     ONSET DATE: 08/21/2022 (date of referral)  REFERRING DIAG: I63.9 (ICD-10-CM) - Cerebral infarction, unspecified  THERAPY DIAG:  Muscle weakness (generalized)  Unsteadiness on feet  Visuospatial deficit  Attention and concentration deficit  Rationale for Evaluation and Treatment: Rehabilitation  SUBJECTIVE:   SUBJECTIVE STATEMENT: Pt reports feeling well overall. Discussed more about when patient has LUE tremors. Please see intervention below.  Pt accompanied by: self and significant other, [name]. Daughter also present  PERTINENT HISTORY: CT/MRI showed new small focus of acute infarction in the right superior cerebellar hemisphere. Cerebellar AVM resection 12/23, pseudomeningocele s/p craniotomy 3/24, acute R  cerebellar infarct, hydrocephalus and VP shunt was placed on 08/08/22, dysphagia, dysarthria, aphasia. PMH: rheumatoid arthritis, bilateral ankle fusion (2014 and 2015), cardiomyopathy, CAD, R TKA, cardiomyopathy, chronic systolic heart failure, macular degeneration, OSA on CPAP  PRECAUTIONS: Fall and Other: swallow precautions with modification with left head tilt and supervision if drinking thinned liquids, per caregiver not to lift, history of orthostatic hypotension resulting in falls; no driving  WEIGHT BEARING RESTRICTIONS: No  PAIN:  Are you having pain? No, denies headaches  FALLS: Has patient fallen in last 6 months? Yes. Number of falls 3 - after the first surgery, got dizzy and fell, using the walker, when up and moving, struggled with orthostatic hypotension but medical team has since decreased BP medications  LIVING ENVIRONMENT: Lives with: lives with their spouse, two teenage daughters (also has dtr who lives in [place]), and 175 lb dog Lives in: House/apartment - Able to live on main floor Stairs: Yes: Internal: ~14 steps; on right going up and External: 3 steps; on right going up Has following equipment at home: Dan Humphreys - 2 wheeled and Wheelchair (manual) Bathroom setup: [employer], regular height toilet  PLOF: Prior to December, pt completely independent with ADL/IADLs, ambulatory without device, pt enjoys golfing, driving, 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  PATIENT GOALS: Return to golf, improve vision for work, travel in June for daughter's outdoor wedding (be off the walker ideally)  OBJECTIVE: (results from evaluation unless otherwise stated)  HAND DOMINANCE: Right  ADLs: Eating: Independent with self-feeding, wife cuts items due to impaired coordination per her report Grooming: Independent mostly, but reports it's painful to brush hair from surgical procedure in December UB Dressing: Supervision, increased time and wife present if needed LB Dressing: Supervision  increased time and wife present if needed. Sometimes sits to don pants Toileting: Supervision, more effort, wife there in case pt loses balance Bathing: Supervision for balance Tub Shower transfers: Supervision likely; however, sponge bath currently due to hand held shower head breaking and plumber coming 5/1.  Equipment:  Toilet riser with armrests , shower chair, bedrail on bed  IADLs: Shopping: Currently unable, previously independent Light housekeeping: Currently unable, previously did laundry Meal Prep: Currently unable, but previously assisted in task Community mobility: and household, pt utilizing rolling walker at all times. Typically sedentary in recliner most of the day. Gets up for bathroom.  Medication management: Wife Presenter, broadcasting: Currently unable, pt typically did before Handwriting: 75% legible and Mild micrographia   MOBILITY STATUS:  Pt ambulatory with RW, wife providing supervision  at all times  POSTURE COMMENTS:  rounded shoulders Sitting balance: Moves/returns truncal midpoint 1-2 inches in multiple planes - Wife describes she believes it is more of a coordination issue, overshooting and then losing balance.   ACTIVITY TOLERANCE: Activity tolerance: Subjective report of significant fatigue  FUNCTIONAL OUTCOME MEASURES: FOTO: 58 score  UPPER EXTREMITY ROM:    Active ROM Right eval Left eval  Shoulder flexion [facility]  Shoulder abduction [facility]  Shoulder adduction [facility] WFL  Shoulder extension [facility]  Shoulder internal rotation    Shoulder external rotation    Elbow flexion WFL WFL  Elbow extension [facility] WFL  Wrist flexion    Wrist extension    Wrist ulnar deviation    Wrist radial deviation    Wrist pronation    Wrist supination    (Blank rows = not tested)  UPPER EXTREMITY MMT:     MMT Right eval Left eval  Shoulder flexion 4+/5 4+/5  Shoulder abduction    Shoulder adduction    Shoulder extension    Shoulder internal  rotation    Shoulder external rotation    Middle trapezius    Lower trapezius    Elbow flexion 4+/5 4+/5  Elbow extension 4+/5 4+/5  Wrist flexion    Wrist extension    Wrist ulnar deviation    Wrist radial deviation    Wrist pronation    Wrist supination    (Blank rows = not tested)  HAND FUNCTION: Grip strength: Right: 67.2 lbs; Left: 66.3 lbs and 3 point pinch: Right: 12 lbs, Left: 15 lbs  COORDINATION: 9 Hole Peg test: Right: 31 sec; Left: 59 sec Box and Blocks:  Right 27 blocks, Left 19blocks  SENSATION: WFL  EDEMA: None  MUSCLE TONE: WFL  COGNITION: Overall cognitive status:  Grossly WFL, possible for attention deficits.  VISION: Subjective report: Pt reports some jerky vision since December Baseline vision: Wears glasses all the time Visual history: cataracts and macular degeneration  VISION ASSESSMENT: Impaired Eye alignment: Impaired: Right eye seemed to have amblyopia-type appearance with visual pursuits  Reading acuity: Pt reports difficulty reading instructions at this time, he reports all letters merge together Tracking/Visual pursuits: Right eye appears to be slower  than left Saccades: overshoots - right eye from temporal to nasal Convergence: WFL Visual Fields: Grossly intact; however, decreased peripheral vision to right side when provided with moving target with detection of object at 30* Diplopia assessment: When given macular degeneration Amsler grid, pt reports double vision of right eye when left eye occluded. Quadrants - WFL   Patient has difficulty with following activities due to following visual impairments: Work related tasks  PERCEPTION: WFL  PRAXIS: WFL  OBSERVATIONS: Patient arrives to session ambulating with rolling walker. Patient wife primarily speaks during the session given patient's dysarthria. Per caregiver, patient had a surgery to address AVM and then had pseudomengiocele and underwent 3 surgeries as a result of  complications. Per report, patient was D/C from hospital and then returned with symptoms of acute stroke. Prior to December patient was fully independent, working full time in IT, and golfing regularly every weekend; however, patient is now fully supervised for all ADLs, not working, and now mobilizing either via manual W/C or walker. Patient reports increased nausea disrupting appetite. Family reports going to the ED on last Wednesday due to new reports of nausea and double vision and there was concern for another stroke; however, family decided to the return home when CT came back clear and did not want to go through additional MRI. Per family, inpatient rehab physician aware of this and recommended they call their neurosurgeon for follow up as well and did not advise against continuing outpatient therapy. Family also expresses concern in not knowing how to truly recognize a stroke as patient now has such extensive deficits. Family also reports that a therapist in inpatient rehab noticed unique eye movements and recommend they also see a therapist that could help address vestibular concerns. Wife showing OT video today. Video showed pt performing visual pursuits with jerky eye movement going from left to right sides, right eye impacted.  09/09/22: Pt received from PT lying on mat table. Pt stating taking a rest break. Pt denies pain or dizziness.  09/30/22: Pt ambulatory with SPC and well-kept. Pt wearing glasses. 10/06/22: Similar appearance   TODAY'S TREATMENT:                                                                                                                              DATE:  Self-care: Education provided regarding possible clinical reasons pt experiencing LUE tremoring based on pt and wife observation over last week. They identified mostly when patient is reaching for items. After observing pt grasp water bottle and noticing the slight tremor, it is likely fine motor control and coordination  is the issue. Pt has WNL strength BUE at 5/5 MMT for shoulder flexion/extension, and elbow flexion/extension. Wanted to rule out muscle strength being the issue. Pt hesitant towards certain treatment ideas this visit, specifically Blaze Pods. He reports he feels like a dog being trained with this modality. Education provided on reason for use; however, based on patient's desired goals, clinical  decision to refrain from use of Blaze Pods, but will continue to assess if appropriate. Pt also desiring to help more with driving; however, has not been cleared to drive. Education provided for driver's rehab, and what may be the process to be able to drive again. Pt and wife to discuss and determine best next steps. Education provided today for remaining OT goals.  Therapeutic activity: Pt engaging in standing activities to include grasping large pegs with left hand and placing on peg board that was placed on vertical surface. Pt then placed all pegs back into bowl. Pt then putting away all items used to monitor balance and muscle endurance to LUE. Pt then ambulating 200' utilizing [facility] with supervision for safety. Total time 10:04 minutes with report 3/10 RPE scale once activity completed.  PATIENT EDUCATION: Education details: Progress note, driver's rehab Education provided to: Patient, spouse Delivered by: Verbal cues, handouts, verbal cues Pt receptive   HOME EXERCISE PROGRAM: 09/02/22: Coordination HEP 09/04/22: Modified coordination HEP (for vision) and vision HEP 09/18/22: No hand out provided but encouraged to work on standing activities throughout the day and combine with table top ball rolling for shoulder ROM. 09/25/22: None 09/30/22: None 10/06/22: Driver's rehab  GOALS: Goals reviewed with patient? Yes  SHORT TERM GOALS: Target date: 09/30/2022   Pt will be independent in LUE HEP. Baseline: Initiated 09/25/22: Completing independently Goal status: MET  2.  Pt will demonstrate improved fine  motor coordination for ADLs as evidenced by decreasing 9 hole peg test score for left hand by >/=5 secs  Baseline: Lt - 59 secs 09/25/22: Lt hand - 43 secs Goal status: MET  3.  Pt will be able to place at least 3 more blocks using left hand with completion of Box and Blocks test.  Baseline: Lt- 19 blocks 09/25/22: Lt - 31 blocks Goal status: MET  4.  Pt will tolerate >/=10 minutes of standing activity to simulate activity tolerance needed for ADL/IADL management. Baseline: Subjective description of sitting down for most tasks 10/06/22: 10:04 minutes of activity Goal status: MET  5.  Pt will write a sentence with no significant decrease in size and maintain 100% legibility.  Baseline: 75% legibility and mild micrographia 09/30/22: 100% legible and normal size Goal status: MET  6.  Pt will improve visual closure skills to recognize and complete visual patterns or missing parts of visual stimuli, enhancing visual perception and problem-solving abilities. Baseline: Overshooting eye movements, impaired peripheral vision, merging of letters per subjective report 09/2822: Visual closure worksheet 3/6 correct Goal status: IN PROGRESS  LONG TERM GOALS: Target date: 11/11/2022   Pt will complete FOTO assessment at time of discharge scoring 68 or greater indicating functional progression with ADL and IADL completion. Baseline: 58  09/30/22: 61 Goal status: IN PROGRESS  2.  Pt will demonstrate improved fine motor coordination for ADLs as evidenced by decreasing 9 hole peg test score for left hand by >/=10 secs  Baseline: Lt - 59 secs 09/25/22: Lt - 43 secs Goal status: MET  3.  Pt will be able to place at least 15 more blocks using left hand with completion of Box and Blocks test.  Baseline: Lt - 19 blocks 09/25/22: Lt- 31 blocks Goal status: REVISED  4.  Pt will improve dynamic standing balance to Good (stand independently unsupported, able to weight shift and cross midline moderately) as  needed for managing ADL/IADL tasks. Baseline: Subjective description of Fair (stand independently unsupported, weight shift, and reach ipsilaterally, LOB when  crossing midline) with rolling walker 09/30/22: Good Goal status: MET  5.  Pt will verbalize at least two visual compensatory methods to include adaptive equipment as needed independently. Baseline:  09/30/22: Education provided Goal status: IN PROGRESS   ASSESSMENT:  CLINICAL IMPRESSION: Pt demonstrating overall progress when reviewing STGs and LTGs meeting another goal this week for progress report. Fine motor coordination goal revised this date. Pt remains limited by impaired fine motor control/coordination, and visual skills. Pt concerned about driving. Education provided for driver's rehab and handout provided. Pt has been given various HEPs during this episode and compensatory strategies. Pt encouraged to incorporate LUE as much as possible in daily activity. Pt receptive. Pt would continue to benefit from skilled OT services to address remaining deficits impacting daily functioning.  PERFORMANCE DEFICITS: in functional skills including ADLs, IADLs, coordination, dexterity, pain, Fine motor control, Gross motor control, mobility, balance, body mechanics, endurance, vision, and UE functional use, cognitive skills including attention, energy/drive, and memory, and psychosocial skills including coping strategies, environmental adaptation, habits, and routines and behaviors.   IMPAIRMENTS: are limiting patient from ADLs, IADLs, work, and leisure.   CO-MORBIDITIES: may have co-morbidities  that affects occupational performance. Patient will benefit from skilled OT to address above impairments and improve overall function.  REHAB POTENTIAL: Good  PLAN:  OT FREQUENCY: 2x/week  OT DURATION: 10 weeks (anticipate 8 weeks, but pt going out of town for the summer months)  PLANNED INTERVENTIONS: self care/ADL training, therapeutic  exercise, therapeutic activity, neuromuscular re-education, balance training, functional mobility training, electrical stimulation, fluidotherapy, moist heat, cryotherapy, patient/family education, cognitive remediation/compensation, visual/perceptual remediation/compensation, psychosocial skills training, energy conservation, coping strategies training, DME and/or AE instructions, and Re-evaluation  RECOMMENDED OTHER SERVICES: Follow-up with neuro ophthalmologist completed. Pt reports having an "eye doctor" that he goes to annually.  CONSULTED AND AGREED WITH PLAN OF CARE: Patient and family member/caregiver  PLAN FOR NEXT SESSION:  Continue standing tolerance for daily activities ie) golfing with personal putter (particular about which clubs go with which activities), balance of varying surfaces in prep for daughter's wedding, meal prep (as interest allows), toss ball back and forth if shoulder pain allows, continue visual activities, fine motor skills with small pegs, typing with dual computers, all fours positioning to taping to target?   [address] [place], [place] 10/06/2022, 12:05 PM

## 2022-10-06 NOTE — Therapy (Signed)
OUTPATIENT SPEECH LANGUAGE PATHOLOGY TREATMENT NOTE   Patient Name: Christopher Yu MRN: [id number] DOB:12-Dec-1960, 62 y.o., male Today's Date: 10/06/2022  PCP: Christopher Ates, MD REFERRING PROVIDER: Charlton Amor, PA-C   END OF SESSION:   End of Session - 10/06/22 1006     Visit Number 9    Number of Visits 17    Date for SLP Re-Evaluation 10/24/22    Authorization Type [employer]    SLP Start Time 1015    SLP Stop Time  1100    SLP Time Calculation (min) 45 min    Activity Tolerance Patient tolerated treatment well                 Past Medical History:  Diagnosis Date   Anxiety    Arthritis, rheumatoid (HCC)    dx 1988   CAD (coronary artery disease), native coronary artery    Mild LAD disease with calcification noted at cath 12//27/16    Cardiomyopathy (HCC)    Cardiomyopathy (HCC)    Chronic systolic heart failure (HCC) 05/01/2015   Depression    Deviated nasal septum 06/25/2011   Headache    High cholesterol    Hyperlipidemia    Impingement syndrome of left shoulder 12/22/2017   Macular degeneration    Nausea and vomiting 08/28/2022   Rheumatoid aortitis    Sleep apnea    does not use cpap - UNABLE TO TOLERATE MASK   Sleep apnea in adult    deviated septum repaired, most recent sleep study was negative   Status post total right knee replacement 06/01/2015   Past Surgical History:  Procedure Laterality Date   ANKLE FUSION Right 05/05/2012   related to his arthritis   ANKLE FUSION Left 05/25/2013   related to his arthritis   ANKLE SURGERY Bilateral    APPLICATION OF CRANIAL NAVIGATION N/A 04/11/2022   Procedure: APPLICATION OF CRANIAL NAVIGATION;  Surgeon: Christopher Renshaw, MD;  Location: [facility] OR;  Service: Neurosurgery;  Laterality: N/A;   APPLICATION OF CRANIAL NAVIGATION N/A 08/08/2022   Procedure: APPLICATION OF CRANIAL NAVIGATION;  Surgeon: Christopher Renshaw, MD;  Location: [facility] OR;  Service: Neurosurgery;  Laterality: N/A;   CARDIAC  CATHETERIZATION N/A 05/01/2015   Procedure: Left Heart Cath and Coronary Angiography;  Surgeon: Christopher Records, MD;  Location: [facility] INVASIVE CV LAB;  Service: Cardiovascular;  Laterality: N/A;   CRANIOTOMY N/A 04/11/2022   Procedure: STEREOTACTIC SUBOCCIPITAL CRANIOTOMY FOR RESECTION OF ARTERIO-VENOUS MALFORMATION;  Surgeon: Christopher Renshaw, MD;  Location: [facility] OR;  Service: Neurosurgery;  Laterality: N/A;   FRACTURE SURGERY     left femur fracture x 3   IR ANGIO EXTERNAL CAROTID SEL EXT CAROTID BILAT MOD SED  01/28/2022   IR ANGIO INTRA EXTRACRAN SEL INTERNAL CAROTID BILAT MOD SED  01/28/2022   IR ANGIO INTRA EXTRACRAN SEL INTERNAL CAROTID BILAT MOD SED  04/16/2022   IR ANGIO VERTEBRAL SEL VERTEBRAL BILAT MOD SED  01/28/2022   IR ANGIO VERTEBRAL SEL VERTEBRAL BILAT MOD SED  04/16/2022   KNEE ARTHROSCOPY     right x 4   LAPAROSCOPIC REVISION VENTRICULAR-PERITONEAL (V-P) SHUNT N/A 08/08/2022   Procedure: LAPAROSCOPIC VENTRICULAR-PERITONEAL (V-P) SHUNT;  Surgeon: Christopher Bouillon, MD;  Location: [facility] OR;  Service: General;  Laterality: N/A;   LUMBAR LAMINECTOMY/DECOMPRESSION MICRODISCECTOMY N/A 07/18/2022   Procedure: Repair of Pseudomeningiocele posterior;  Surgeon: Christopher Renshaw, MD;  Location: [facility];  Service: Neurosurgery;  Laterality: N/A;   NASAL SEPTOPLASTY W/ TURBINOPLASTY  06/25/2011   Procedure: NASAL  SEPTOPLASTY WITH TURBINATE REDUCTION;  Surgeon: Christopher Coho, MD;  Location: [facility] OR;  Service: ENT;  Laterality: Bilateral;   PLACEMENT OF LUMBAR DRAIN N/A 07/18/2022   Procedure: PLACEMENT OF LUMBAR DRAIN;  Surgeon: Christopher Renshaw, MD;  Location: [facility];  Service: Neurosurgery;  Laterality: N/A;   TONSILLECTOMY     TOTAL KNEE ARTHROPLASTY Right 06/01/2015   Procedure: RIGHT TOTAL KNEE ARTHROPLASTY;  Surgeon: Christopher Hitch, MD;  Location: [facility];  Service: Orthopedics;  Laterality: Right;  Block+general   VENTRICULOPERITONEAL SHUNT Right 08/08/2022   Procedure: LAP ASSITED SHUNT  INSERTION VENTRICULAR-PERITONEAL;  Surgeon: Christopher Renshaw, MD;  Location: [facility];  Service: Neurosurgery;  Laterality: Right;   Patient Active Problem List   Diagnosis Date Noted   Nausea and vomiting 08/28/2022   Adjustment disorder 08/06/2022   Protein-calorie malnutrition, severe 07/31/2022   Cerebellar stroke (HCC) 07/29/2022   Ischemic cerebrovascular accident (CVA) (HCC) 07/28/2022   Prolonged QT interval 07/28/2022   Pseudomeningocele 07/18/2022   Pseudomeningocele due to surgical procedure 07/18/2022   Status post craniotomy 04/11/2022   AVM (arteriovenous malformation) brain 04/11/2022   Impingement syndrome of left shoulder 12/22/2017   Impingement syndrome of right shoulder 12/22/2017   Rheumatoid arthritis involving right knee (HCC) 06/01/2015   Status post total right knee replacement 06/01/2015   Cardiomyopathy (HCC) 05/02/2015   Hyperlipidemia    Sleep apnea in adult    Rheumatoid arthritis (HCC)    Chronic systolic heart failure (HCC) 05/01/2015   CAD (coronary artery disease), native coronary artery     ONSET DATE: 07/28/22  REFERRING DIAG:  I63.9 (ICD-10-CM) - Cerebral infarction, unspecified   THERAPY DIAG:  Dysarthria and anarthria  Dysphagia, unspecified type  Rationale for Evaluation and Treatment: Rehabilitation  SUBJECTIVE:   SUBJECTIVE STATEMENT: "He runs out of air when talking, his breathing seems labored when he starts to talk"   PAIN:  Are you having pain? Yes NPRS scale: 8/10 Pain location: right shoulder Pain orientation: Right  PAIN TYPE: aching Pain description: constant  Aggravating factors: movement Relieving factors: rest   OBJECTIVE:   TODAY'S TREATMENT:                                                                                                                                         DATE:   10/06/22: Wife, Christopher Yu and daughter Christopher Yu present. Christopher Yu reports concern re: [name] running out of air when talking and before  starting to talk, audible breaths. General endorses both. Christopher Yu continues to endorse coughing with PO "occasionally" Today, he required cues to completed left head turn with water sips, resulting in coughing 1/4 sips. Due to pt concerns re: breath support, measured Maximum Expiratory Pressure (MEP) and Maximum Inspiratory Pressure (MIP) using pressure manometer, nose plugs. Demitrus's MEP was 111 cm/H2O; WNL for his age is 74. MIP was 104, WNL 94. Targeted dysphagia, speech training [name] on EMST 150, set  at 70 cm/H20, approx 70% of his MEP, goal to reach 80% of MEP. Jabree demonstrated 5 sets of 5 reps. Christopher Yu verbalized understanding - handout provided - see pt instructions. Next session, observe with EMST, educate this will need to be done for 8 weeks min  10/02/22: Targeted conversation topics to practice increasing conversation length and use of dysarthria strategies during conversational speech. Led through debate topics to work on mental flexibility with mod-A of models and cues. Patient demonstrated mental rigidity and required max cues to consider additional perspectives and complete x1 scenario. Spouse mentions that this has been consistent in the past, but that patient is more blunt since the stroke. Led through safe conversation topics to discuss during the party including, education, family. Role play to work on Mudlogger language for future party. HEP sent of ways to avoid un-preferred conversation topics.   09/30/22: Patient has a graduation party this weekend. He plans on avoiding people and not talking. Spouse, and SLP encouraged him to speak more and to plan topics beforehand. Led through answering questions and problem solving. Was able to speak in 30 minute conversation with min-A, and encouragement. Intelligibility at 90%, with occasional collapse of longer words, however, patient was able to clear throat and self correct. Voice sounded less groggy/wet when compared to last week. Patient spoke more  with desired topics, ie golf, politics between [place]    09/25/22: Led through cognitive exercises to state similarities and differences between items with min-A. Patient carried over slow rate of speech from structured task to conversational speech through telling of a story of friends. Speech clear, with min-cuing. Pt states that speech sounds clear when asked to self monitor. Unable to do HEP sent home 5/20 due to graduation. States that graduation required lots of talking and that he feels like it went well. SLP noted voice quality to be wet and groggy, that did not diminish with throat clear and water, however this did not decrease intelligibility.    5/20/24[name] with limited completion of HEP at home as they had company from out of town. He completed HEP using slow rate, over articulation repeating multi-syllabic words and generating sentence with the word with occasional min verbal cues and modeling. Speech today more slurred than last session - suspect fatigue due to company this weekend. In structured task generating 3 sentence salient description of vegetables, Jaymeson carried over slow rate and over articulation requiring 2 verbal cues to ID unintelligible words (out of 15 sentences) and to repeat intelligibly. In repetition task (due to reduced vision) Franchot stated 4 words and Id'd the item that did not belong and explained way using intelligible speech 10/10x. In conversation re: grilling/cooking, he maintained intelligible speech in this quiet environment, however as noted before, speech with increased imprecision and slur today. Sequencing homework provided - family will need to read this to him  09/18/22: Pt reports ongoing daily completion of effortful swallow to address dysphagia. Subjective improvement of swallow function per pt report with reduced instances of coughing with liquids and no longer using thickened liquids. In structured practice targeting tri-syllabic words at  generative sentence level, pt with 100% accuracy. Practice naturally occurring in conversational speech results in ~70% accuracy of 3+ syllable words with increased challenges endorsed by pt. Barrier to communication at home is pt resistant to repeating himself. Education provided regarding increased complexity of using strategies in conversation to encourage pt to give himself grace when breakdowns occur. Add oral reading to HEP,  if able d/t vision concerns related to dry eyes.      PATIENT EDUCATION: Education details: POC, goals, evaluation results/recommendations Person educated: Patient and Spouse Education method: Explanation, Demonstration, Verbal cues, and Handouts Education comprehension: verbalized understanding, returned demonstration, and needs further education     GOALS: Goals reviewed with patient? Yes   SHORT TERM GOALS: Target date: 10/03/2022   Pt will teach back dysarthria strategies and compensations with mod-I Baseline: Goal status: MET   2.   Pt will employ dysarthria strategies during structured practice activities resulting in intelligible production at mult-sentence level in 80% of trials Baseline:  Goal status: MET   3. Pt will demonstrate use of swallow compensations/strategies with PO trials with mod-I over 2 sessions  Baseline:  Goal status: PARTIALLY MET   4.  Pt will complete HEP 6/7 days over 1 week period with mod-I Baseline:  Goal status: MET     LONG TERM GOALS: Target date: 10/24/2022   Pt will complete repeat MBSS Baseline:  Goal status: ONGOING   2.  Pt will carryover dysarthria strategies/compensations during 20 minute conversation resulting in 90% ineligibility  Baseline:  Goal status: ONGOING   3.  Pt and spouse will report subjective report of reduced need for requests for repetition by 50% at home, over 1 week period Baseline:  Goal status:ONGOING   4.  Pt will report improvement via PROM by dc  Baseline:  Goal status:  ONGOING     ASSESSMENT:   CLINICAL IMPRESSION: Patient is a 62 y.o. M who was seen today for dysphagia and motor speech eval s/p cerebellar stroke. Evaluation reveals moderate dysarthria and recent MBSS demonstrates mild pharyngeal dysphagia. Pt's speech is c/b irregular imprecise articulation and distortions, volume decay on expanded utterances, and reduced use of prosody. Pt reports frustration regarding his communicative abilities, tells SLP his vocation has high level of demands for communication and he feels he will "never get back there, it's impossible." As evaluation progressed, pt with increased frustration with SLP providing education to encourage participation and positive mindset for engagement in therapy course. Brief introduction to dysarthria strategies initiated. Reviewed instrumental swallow study results and updated pt's dysphagia HEP to accommodate preferences while targeting demonstrated deficits. Will plan to request repeat MBSS following usual completion of dysphagia HEP over 1-2 month period. Pt would benefit from skilled ST to address aforementioned deficits to improve QoL, enhance communication efficacy, and facilitate improved swallow function/safety .      OBJECTIVE IMPAIRMENTS: include dysarthria and dysphagia. These impairments are limiting patient from return to work, effectively communicating at home and in community, and safety when swallowing. Factors affecting potential to achieve goals and functional outcome are cooperation/participation level. Patient will benefit from skilled SLP services to address above impairments and improve overall function.   REHAB POTENTIAL: Good   PLAN:   SLP FREQUENCY: 2x/week   SLP DURATION: 8 weeks   PLANNED INTERVENTIONS: Aspiration precaution training, Pharyngeal strengthening exercises, Diet toleration management , Trials of upgraded texture/liquids, Cueing hierachy, Internal/external aids, Functional tasks, Multimodal  communication approach, SLP instruction and feedback, Compensatory strategies, Patient/family education, and Re-evaluation     Arth Nicastro, Radene Journey, CCC-SLP 10/06/2022, 12:24 PM

## 2022-10-08 ENCOUNTER — Ambulatory Visit: Payer: BC Managed Care – PPO

## 2022-10-08 ENCOUNTER — Encounter: Payer: Self-pay | Admitting: Physical Therapy

## 2022-10-08 ENCOUNTER — Ambulatory Visit: Payer: BC Managed Care – PPO | Admitting: Physical Therapy

## 2022-10-08 VITALS — BP 114/80 | HR 88

## 2022-10-08 DIAGNOSIS — R471 Dysarthria and anarthria: Secondary | ICD-10-CM

## 2022-10-08 DIAGNOSIS — R2681 Unsteadiness on feet: Secondary | ICD-10-CM

## 2022-10-08 DIAGNOSIS — M6281 Muscle weakness (generalized): Secondary | ICD-10-CM

## 2022-10-08 DIAGNOSIS — R278 Other lack of coordination: Secondary | ICD-10-CM

## 2022-10-08 DIAGNOSIS — R1310 Dysphagia, unspecified: Secondary | ICD-10-CM

## 2022-10-08 DIAGNOSIS — R42 Dizziness and giddiness: Secondary | ICD-10-CM

## 2022-10-08 DIAGNOSIS — R269 Unspecified abnormalities of gait and mobility: Secondary | ICD-10-CM

## 2022-10-08 NOTE — Therapy (Deleted)
OUTPATIENT SPEECH LANGUAGE PATHOLOGY TREATMENT NOTE   Patient Name: Christopher Yu MRN: [id number] DOB:1960-07-05, 62 y.o., male Today's Date: 10/08/2022  PCP: Christopher Ates, MD REFERRING PROVIDER: Charlton Amor, PA-C   END OF SESSION:         Past Medical History:  Diagnosis Date   Anxiety    Arthritis, rheumatoid (HCC)    dx 1988   CAD (coronary artery disease), native coronary artery    Mild LAD disease with calcification noted at cath 12//27/16    Cardiomyopathy (HCC)    Cardiomyopathy (HCC)    Chronic systolic heart failure (HCC) 05/01/2015   Depression    Deviated nasal septum 06/25/2011   Headache    High cholesterol    Hyperlipidemia    Impingement syndrome of left shoulder 12/22/2017   Macular degeneration    Nausea and vomiting 08/28/2022   Rheumatoid aortitis    Sleep apnea    does not use cpap - UNABLE TO TOLERATE MASK   Sleep apnea in adult    deviated septum repaired, most recent sleep study was negative   Status post total right knee replacement 06/01/2015   Past Surgical History:  Procedure Laterality Date   ANKLE FUSION Right 05/05/2012   related to his arthritis   ANKLE FUSION Left 05/25/2013   related to his arthritis   ANKLE SURGERY Bilateral    APPLICATION OF CRANIAL NAVIGATION N/A 04/11/2022   Procedure: APPLICATION OF CRANIAL NAVIGATION;  Surgeon: Christopher Renshaw, MD;  Location: [facility] OR;  Service: Neurosurgery;  Laterality: N/A;   APPLICATION OF CRANIAL NAVIGATION N/A 08/08/2022   Procedure: APPLICATION OF CRANIAL NAVIGATION;  Surgeon: Christopher Renshaw, MD;  Location: [facility] OR;  Service: Neurosurgery;  Laterality: N/A;   CARDIAC CATHETERIZATION N/A 05/01/2015   Procedure: Left Heart Cath and Coronary Angiography;  Surgeon: Christopher Records, MD;  Location: [facility] INVASIVE CV LAB;  Service: Cardiovascular;  Laterality: N/A;   CRANIOTOMY N/A 04/11/2022   Procedure: STEREOTACTIC SUBOCCIPITAL CRANIOTOMY FOR RESECTION OF ARTERIO-VENOUS  MALFORMATION;  Surgeon: Christopher Renshaw, MD;  Location: [facility] OR;  Service: Neurosurgery;  Laterality: N/A;   FRACTURE SURGERY     left femur fracture x 3   IR ANGIO EXTERNAL CAROTID SEL EXT CAROTID BILAT MOD SED  01/28/2022   IR ANGIO INTRA EXTRACRAN SEL INTERNAL CAROTID BILAT MOD SED  01/28/2022   IR ANGIO INTRA EXTRACRAN SEL INTERNAL CAROTID BILAT MOD SED  04/16/2022   IR ANGIO VERTEBRAL SEL VERTEBRAL BILAT MOD SED  01/28/2022   IR ANGIO VERTEBRAL SEL VERTEBRAL BILAT MOD SED  04/16/2022   KNEE ARTHROSCOPY     right x 4   LAPAROSCOPIC REVISION VENTRICULAR-PERITONEAL (V-P) SHUNT N/A 08/08/2022   Procedure: LAPAROSCOPIC VENTRICULAR-PERITONEAL (V-P) SHUNT;  Surgeon: Christopher Bouillon, MD;  Location: [facility] OR;  Service: General;  Laterality: N/A;   LUMBAR LAMINECTOMY/DECOMPRESSION MICRODISCECTOMY N/A 07/18/2022   Procedure: Repair of Pseudomeningiocele posterior;  Surgeon: Christopher Renshaw, MD;  Location: [facility];  Service: Neurosurgery;  Laterality: N/A;   NASAL SEPTOPLASTY W/ TURBINOPLASTY  06/25/2011   Procedure: NASAL SEPTOPLASTY WITH TURBINATE REDUCTION;  Surgeon: Christopher Coho, MD;  Location[facility];  Service: ENT;  Laterality: Bilateral;   PLACEMENT OF LUMBAR DRAIN N/A 07/18/2022   Procedure: PLACEMENT OF LUMBAR DRAIN;  Surgeon: Christopher Renshaw, MD;  Location: [facility] OR;  Service: Neurosurgery;  Laterality: N/A;   TONSILLECTOMY     TOTAL KNEE ARTHROPLASTY Right 06/01/2015   Procedure: RIGHT TOTAL KNEE ARTHROPLASTY;  Surgeon: Christopher Hitch, MD;  Location: [facility];  Service: Orthopedics;  Laterality: Right;  Block+general   VENTRICULOPERITONEAL SHUNT Right 08/08/2022   Procedure: LAP ASSITED SHUNT INSERTION VENTRICULAR-PERITONEAL;  Surgeon: Christopher Renshaw, MD;  Location: [facility] OR;  Service: Neurosurgery;  Laterality: Right;   Patient Active Problem List   Diagnosis Date Noted   Nausea and vomiting 08/28/2022   Adjustment disorder 08/06/2022   Protein-calorie malnutrition, severe  07/31/2022   Cerebellar stroke (HCC) 07/29/2022   Ischemic cerebrovascular accident (CVA) (HCC) 07/28/2022   Prolonged QT interval 07/28/2022   Pseudomeningocele 07/18/2022   Pseudomeningocele due to surgical procedure 07/18/2022   Status post craniotomy 04/11/2022   AVM (arteriovenous malformation) brain 04/11/2022   Impingement syndrome of left shoulder 12/22/2017   Impingement syndrome of right shoulder 12/22/2017   Rheumatoid arthritis involving right knee (HCC) 06/01/2015   Status post total right knee replacement 06/01/2015   Cardiomyopathy (HCC) 05/02/2015   Hyperlipidemia    Sleep apnea in adult    Rheumatoid arthritis (HCC)    Chronic systolic heart failure (HCC) 05/01/2015   CAD (coronary artery disease), native coronary artery     ONSET DATE: 07/28/22  REFERRING DIAG:  I63.9 (ICD-10-CM) - Cerebral infarction, unspecified   THERAPY DIAG:  No diagnosis found.  Rationale for Evaluation and Treatment: Rehabilitation  SUBJECTIVE:   SUBJECTIVE STATEMENT: ***  PAIN:  Are you having pain? Yes NPRS scale: 8/10 Pain location: right shoulder Pain orientation: Right  PAIN TYPE: aching Pain description: constant  Aggravating factors: movement Relieving factors: rest   OBJECTIVE:   TODAY'S TREATMENT:                                                                                                                                         DATE:   10/08/22: ***   10/06/22: Wife, Christopher Yu and daughter Christopher Yu present. Christopher Yu reports concern re: Christopher Yu running out of air when talking and before starting to talk, audible breaths. Christopher Yu endorses both. Christopher Yu continues to endorse coughing with PO "occasionally" Today, he required cues to completed left head turn with water sips, resulting in coughing 1/4 sips. Due to pt concerns re: breath support, measured Maximum Expiratory Pressure (MEP) and Maximum Inspiratory Pressure (MIP) using pressure manometer, nose plugs. Christopher Yu's MEP was 111 cm/H2O;  WNL for his age is 57. MIP was 104, WNL 94. Targeted dysphagia, speech training Christopher Yu on EMST 150, set at 70 cm/H20, approx 70% of his MEP, goal to reach 80% of MEP. Christopher Yu demonstrated 5 sets of 5 reps. Christopher Yu verbalized understanding - handout provided - see pt instructions. Next session, observe with EMST, educate this will need to be done for 8 weeks min  10/02/22: Targeted conversation topics to practice increasing conversation length and use of dysarthria strategies during conversational speech. Led through debate topics to work on mental flexibility with mod-A of models and cues. Patient demonstrated mental rigidity and required max cues to consider additional perspectives and  complete x1 scenario. Spouse mentions that this has been consistent in the past, but that patient is more blunt since the stroke. Led through safe conversation topics to discuss during the party including, education, family. Role play to work on Mudlogger language for future party. HEP sent of ways to avoid un-preferred conversation topics.   09/30/22: Patient has a graduation party this weekend. He plans on avoiding people and not talking. Spouse, and SLP encouraged him to speak more and to plan topics beforehand. Led through answering questions and problem solving. Was able to speak in 30 minute conversation with min-A, and encouragement. Intelligibility at 90%, with occasional collapse of longer words, however, patient was able to clear throat and self correct. Voice sounded less groggy/wet when compared to last week. Patient spoke more with desired topics, ie golf, politics between [place]    09/25/22: Led through cognitive exercises to state similarities and differences between items with min-A. Patient carried over slow rate of speech from structured task to conversational speech through telling of a story of friends. Speech clear, with min-cuing. Pt states that speech sounds clear when asked to self monitor. Unable  to do HEP sent home 5/20 due to graduation. States that graduation required lots of talking and that he feels like it went well. SLP noted voice quality to be wet and groggy, that did not diminish with throat clear and water, however this did not decrease intelligibility.    5/20/24Jacquenette Yu with limited completion of HEP at home as they had company from out of town. He completed HEP using slow rate, over articulation repeating multi-syllabic words and generating sentence with the word with occasional min verbal cues and modeling. Speech today more slurred than last session - suspect fatigue due to company this weekend. In structured task generating 3 sentence salient description of vegetables, [place] carried over slow rate and over articulation requiring 2 verbal cues to ID unintelligible words (out of 15 sentences) and to repeat intelligibly. In repetition task (due to reduced vision) Carmine stated 4 words and Id'd the item that did not belong and explained way using intelligible speech 10/10x. In conversation re: grilling/cooking, he maintained intelligible speech in this quiet environment, however as noted before, speech with increased imprecision and slur today. Sequencing homework provided - family will need to read this to him  09/18/22: Pt reports ongoing daily completion of effortful swallow to address dysphagia. Subjective improvement of swallow function per pt report with reduced instances of coughing with liquids and no longer using thickened liquids. In structured practice targeting tri-syllabic words at generative sentence level, pt with 100% accuracy. Practice naturally occurring in conversational speech results in ~70% accuracy of 3+ syllable words with increased challenges endorsed by pt. Barrier to communication at home is pt resistant to repeating himself. Education provided regarding increased complexity of using strategies in conversation to encourage pt to give himself grace when breakdowns  occur. Add oral reading to HEP, if able d/t vision concerns related to dry eyes.      PATIENT EDUCATION: Education details: POC, goals, evaluation results/recommendations Person educated: Patient and Spouse Education method: Explanation, Demonstration, Verbal cues, and Handouts Education comprehension: verbalized understanding, returned demonstration, and needs further education     GOALS: Goals reviewed with patient? Yes   SHORT TERM GOALS: Target date: 10/03/2022   Pt will teach back dysarthria strategies and compensations with mod-I Baseline: Goal status: MET   2.   Pt will employ dysarthria strategies during structured practice activities  resulting in intelligible production at mult-sentence level in 80% of trials Baseline:  Goal status: MET   3. Pt will demonstrate use of swallow compensations/strategies with PO trials with mod-I over 2 sessions  Baseline:  Goal status: PARTIALLY MET   4.  Pt will complete HEP 6/7 days over 1 week period with mod-I Baseline:  Goal status: MET     LONG TERM GOALS: Target date: 10/24/2022   Pt will complete repeat MBSS Baseline:  Goal status: ONGOING   2.  Pt will carryover dysarthria strategies/compensations during 20 minute conversation resulting in 90% ineligibility  Baseline:  Goal status: ONGOING   3.  Pt and spouse will report subjective report of reduced need for requests for repetition by 50% at home, over 1 week period Baseline:  Goal status:ONGOING   4.  Pt will report improvement via PROM by dc  Baseline:  Goal status: ONGOING     ASSESSMENT:   CLINICAL IMPRESSION: Patient is a 62 y.o. M who was seen today for dysphagia and motor speech eval s/p cerebellar stroke. Evaluation reveals moderate dysarthria and recent MBSS demonstrates mild pharyngeal dysphagia. Pt's speech is c/b irregular imprecise articulation and distortions, volume decay on expanded utterances, and reduced use of prosody. Pt reports frustration  regarding his communicative abilities, tells SLP his vocation has high level of demands for communication and he feels he will "never get back there, it's impossible." As evaluation progressed, pt with increased frustration with SLP providing education to encourage participation and positive mindset for engagement in therapy course. Brief introduction to dysarthria strategies initiated. Reviewed instrumental swallow study results and updated pt's dysphagia HEP to accommodate preferences while targeting demonstrated deficits. Will plan to request repeat MBSS following usual completion of dysphagia HEP over 1-2 month period. Pt would benefit from skilled ST to address aforementioned deficits to improve QoL, enhance communication efficacy, and facilitate improved swallow function/safety .      OBJECTIVE IMPAIRMENTS: include dysarthria and dysphagia. These impairments are limiting patient from return to work, effectively communicating at home and in community, and safety when swallowing. Factors affecting potential to achieve goals and functional outcome are cooperation/participation level. Patient will benefit from skilled SLP services to address above impairments and improve overall function.   REHAB POTENTIAL: Good   PLAN:   SLP FREQUENCY: 2x/week   SLP DURATION: 8 weeks   PLANNED INTERVENTIONS: Aspiration precaution training, Pharyngeal strengthening exercises, Diet toleration management , Trials of upgraded texture/liquids, Cueing hierachy, Internal/external aids, Functional tasks, Multimodal communication approach, SLP instruction and feedback, Compensatory strategies, Patient/family education, and Re-evaluation     [place], Student-SLP 10/08/2022, 9:25 AM

## 2022-10-08 NOTE — Therapy (Signed)
[facility] [address] [place], [place], [address] Phone: [phone]   Fax:  [phone]  Patient Details  Name: Christopher Yu MRN: [id number] Date of Birth: May 31, 1960 Referring Provider:  Thana Ates, MD  Encounter Date: 10/08/2022  Arrive/Cancel- No Charge Arrived to PT session but requested to depart d/t illness. No ST tx completed today.    Gracy Racer, CCC-SLP 10/08/2022, 10:18 AM  [facility] [address] [place], [place], [address] Phone: [phone]   Fax:  [phone]

## 2022-10-08 NOTE — Therapy (Signed)
OUTPATIENT PHYSICAL THERAPY NEURO TREATMENT-ARRIVED NO CHARGE   Patient Name: Randyn Feland MRN: [id number] DOB:01/31/1961, 62 y.o., male Today's Date: 10/08/2022  PCP: Thana Ates, MD REFERRING PROVIDER: Charlton Amor, PA-C  END OF SESSION:  PT End of Session - 10/08/22 0936     Visit Number 10    Number of Visits 17    Date for PT Re-Evaluation 11/07/22    Authorization Type [employer]    PT Start Time 0930    PT Stop Time 0940    PT Time Calculation (min) 10 min    Equipment Utilized During Treatment Gait belt    Activity Tolerance Other (comment)   pt shivering in session, requesting to go home, severe bilateral hand and left knee pain related to arthritis per report   Behavior During Therapy [facility] for tasks assessed/performed             Past Medical History:  Diagnosis Date   Anxiety    Arthritis, rheumatoid (HCC)    dx 1988   CAD (coronary artery disease), native coronary artery    Mild LAD disease with calcification noted at cath 12//27/16    Cardiomyopathy (HCC)    Cardiomyopathy (HCC)    Chronic systolic heart failure (HCC) 05/01/2015   Depression    Deviated nasal septum 06/25/2011   Headache    High cholesterol    Hyperlipidemia    Impingement syndrome of left shoulder 12/22/2017   Macular degeneration    Nausea and vomiting 08/28/2022   Rheumatoid aortitis    Sleep apnea    does not use cpap - UNABLE TO TOLERATE MASK   Sleep apnea in adult    deviated septum repaired, most recent sleep study was negative   Status post total right knee replacement 06/01/2015   Past Surgical History:  Procedure Laterality Date   ANKLE FUSION Right 05/05/2012   related to his arthritis   ANKLE FUSION Left 05/25/2013   related to his arthritis   ANKLE SURGERY Bilateral    APPLICATION OF CRANIAL NAVIGATION N/A 04/11/2022   Procedure: APPLICATION OF CRANIAL NAVIGATION;  Surgeon: Lisbeth Renshaw, MD;  Location: [facility] OR;  Service: Neurosurgery;   Laterality: N/A;   APPLICATION OF CRANIAL NAVIGATION N/A 08/08/2022   Procedure: APPLICATION OF CRANIAL NAVIGATION;  Surgeon: Lisbeth Renshaw, MD;  Location: [facility] OR;  Service: Neurosurgery;  Laterality: N/A;   CARDIAC CATHETERIZATION N/A 05/01/2015   Procedure: Left Heart Cath and Coronary Angiography;  Surgeon: Lyn Records, MD;  Location: [facility] INVASIVE CV LAB;  Service: Cardiovascular;  Laterality: N/A;   CRANIOTOMY N/A 04/11/2022   Procedure: STEREOTACTIC SUBOCCIPITAL CRANIOTOMY FOR RESECTION OF ARTERIO-VENOUS MALFORMATION;  Surgeon: Lisbeth Renshaw, MD;  Location: [facility] OR;  Service: Neurosurgery;  Laterality: N/A;   FRACTURE SURGERY     left femur fracture x 3   IR ANGIO EXTERNAL CAROTID SEL EXT CAROTID BILAT MOD SED  01/28/2022   IR ANGIO INTRA EXTRACRAN SEL INTERNAL CAROTID BILAT MOD SED  01/28/2022   IR ANGIO INTRA EXTRACRAN SEL INTERNAL CAROTID BILAT MOD SED  04/16/2022   IR ANGIO VERTEBRAL SEL VERTEBRAL BILAT MOD SED  01/28/2022   IR ANGIO VERTEBRAL SEL VERTEBRAL BILAT MOD SED  04/16/2022   KNEE ARTHROSCOPY     right x 4   LAPAROSCOPIC REVISION VENTRICULAR-PERITONEAL (V-P) SHUNT N/A 08/08/2022   Procedure: LAPAROSCOPIC VENTRICULAR-PERITONEAL (V-P) SHUNT;  Surgeon: Harriette Bouillon, MD;  Location: [facility] OR;  Service: General;  Laterality: N/A;   LUMBAR LAMINECTOMY/DECOMPRESSION MICRODISCECTOMY N/A 07/18/2022  Procedure: Repair of Pseudomeningiocele posterior;  Surgeon: Lisbeth Renshaw, MD;  Location: [facility] OR;  Service: Neurosurgery;  Laterality: N/A;   NASAL SEPTOPLASTY W/ TURBINOPLASTY  06/25/2011   Procedure: NASAL SEPTOPLASTY WITH TURBINATE REDUCTION;  Surgeon: Osborn Coho, MD;  Location[facility] OR;  Service: ENT;  Laterality: Bilateral;   PLACEMENT OF LUMBAR DRAIN N/A 07/18/2022   Procedure: PLACEMENT OF LUMBAR DRAIN;  Surgeon: Lisbeth Renshaw, MD;  Location: [facility] OR;  Service: Neurosurgery;  Laterality: N/A;   TONSILLECTOMY     TOTAL KNEE ARTHROPLASTY Right 06/01/2015   Procedure:  RIGHT TOTAL KNEE ARTHROPLASTY;  Surgeon: Kathryne Hitch, MD;  Location: [facility] ORS;  Service: Orthopedics;  Laterality: Right;  Block+general   VENTRICULOPERITONEAL SHUNT Right 08/08/2022   Procedure: LAP ASSITED SHUNT INSERTION VENTRICULAR-PERITONEAL;  Surgeon: Lisbeth Renshaw, MD;  Location: [facility] OR;  Service: Neurosurgery;  Laterality: Right;   Patient Active Problem List   Diagnosis Date Noted   Nausea and vomiting 08/28/2022   Adjustment disorder 08/06/2022   Protein-calorie malnutrition, severe 07/31/2022   Cerebellar stroke (HCC) 07/29/2022   Ischemic cerebrovascular accident (CVA) (HCC) 07/28/2022   Prolonged QT interval 07/28/2022   Pseudomeningocele 07/18/2022   Pseudomeningocele due to surgical procedure 07/18/2022   Status post craniotomy 04/11/2022   AVM (arteriovenous malformation) brain 04/11/2022   Impingement syndrome of left shoulder 12/22/2017   Impingement syndrome of right shoulder 12/22/2017   Rheumatoid arthritis involving right knee (HCC) 06/01/2015   Status post total right knee replacement 06/01/2015   Cardiomyopathy (HCC) 05/02/2015   Hyperlipidemia    Sleep apnea in adult    Rheumatoid arthritis (HCC)    Chronic systolic heart failure (HCC) 05/01/2015   CAD (coronary artery disease), native coronary artery     ONSET DATE: 08/21/2022 (referral date)  REFERRING DIAG: I63.9 (ICD-10-CM) - Cerebral infarction, unspecified  THERAPY DIAG:  Muscle weakness (generalized)  Unsteadiness on feet  Other lack of coordination  Abnormality of gait and mobility  Dizziness and giddiness  Rationale for Evaluation and Treatment: Rehabilitation  SUBJECTIVE:                                                                                                                                                                                             SUBJECTIVE STATEMENT: Patient reports having an arthritis flare.  He walks in with CGA from wife w/ SPC.  He reports  not having slept well due to pain and is having difficulty using his hands.  He denies falls or near misses.  He is visibly shaking and when asked by PT if he feels up to any version of PT or other therapy  today he says "no, not really."    Pt accompanied by: self and significant other - wife Deagen Gauna  PERTINENT HISTORY: cerebellar AVM resection 12/23, pseudomeningocele s/p craniotomy 3/24, acute R cerebellar infarct, hydrocephalus and VP shunt was placed on 08/08/22, dysphagia, dysarthria, aphasia  PMH: rheumatoid arthritis, bilateral ankle fusion (2014 and 2015), cardiomyopathy, CAD, R TKA, cardiomyopathy, chronic systolic heart failure, macular degeneration, OSA on CPAP  PAIN:  Are you having pain? No  PRECAUTIONS: Fall and Other: swallow precautions with modification with left head tilt and supervision if drinking thinned liquids, per caregiver not to lift, history of orthostatic hypotension resulting in falls  PATIENT GOALS: "To get back to playing golf and moving like I used to."  OBJECTIVE:   DIAGNOSTIC FINDINGS:   CT on 08/27/2022: IMPRESSION: 1. No evidence of acute infarct or hemorrhage. 2. Stable right-sided ventriculostomy catheter, tip within the right lateral ventricle. Stable size of the lateral ventricles without hydrocephalus. 3. Postsurgical changes from prior suboccipital craniectomy, with underlying encephalomalacia in the cerebellum. The pseudomeningocele seen on prior study has increased slightly in size since prior exam, of uncertain significance. 4. Decreased size of the right superior cerebellar intraparenchymal cyst, with decreased mass effect upon the fourth ventricle.  MR Brain on 07/28/2022: IMPRESSION: 1. New small focus of acute infarction in the right superior cerebellar hemisphere with surrounding area of increased T2 signal and cystic change, which is new since the prior MRI dated 07/05/2022 and suspicious for now chronic infarct. 2.  Postoperative changes of prior left posterior fossa resection. Redemonstrated foci of gas within the resection cavity, fourth ventricle and extra-axial spaces, likely secondary to recent pseudomeningocele repair and/or lumbar drain placement/removal. 3. Decreased size of the fluid collection in the dorsal soft tissues overlying the suboccipital craniectomy.  TODAY'S TREATMENT:                                                                                                                              -Assessed vitals: Today's Vitals   10/08/22 0933  BP: 114/80  Pulse: 88  SpO2: 97%   PATIENT EDUCATION: Education details:  Monitor symptoms and follow-up with MD as needed. Person educated: Patient and Spouse Education method: Medical illustrator Education comprehension: verbalized understanding and needs further education  HOME EXERCISE PROGRAM: You Can Walk For A Certain Length Of Time Each Day                          Walk 2 minutes 1 times per day.             Increase 2  minutes every 3 days              Work up to 30 minutes (1-2 times per day).               Example:  Day 1-2           4-5 minutes     3 times per day                         Day 7-8           10-12 minutes 2-3 times per day                         Day 13-14       20-22 minutes 1-2 times per day  Oculomotor: Saccades    Holding two targets positioned side by side __6__ inches apart, move eyes quickly from target to target as head stays still.  Perform sitting. Repeat __10__ times per session. Do __3__ sessions per day.  Access Code: [email or web address] URL: [email or web address] Date: 09/12/2022 Prepared by: Camille Bal  Exercises - Pencil Pushups  - 1 x daily - 5 x weekly - 3 sets - 5 reps - Corner Balance Feet Together With Eyes Closed  - 1 x daily - 5 x weekly - 1 sets - 3-4 reps - 30-45 seconds hold - Standing Tandem Balance with Counter Support  - 1 x  daily - 5 x weekly - 1 sets - 2-3 reps - 45 seconds hold - Tandem Walking with Counter Support  - 1 x daily - 5 x weekly - 3 sets - 10 reps - Backward Walking with Counter Support  - 1 x daily - 5 x weekly - 3 sets - 10 reps - Walking with Head Rotation  - 1 x daily - 5 x weekly - 3 sets - 10 reps  GOALS: Goals reviewed with patient? Yes  SHORT TERM GOALS: Target date: 09/26/2022  Patient will demonstrate 100% compliance with initial HEP to continue to progress between physical therapy sessions.   Baseline: Pt is intermittently compliant (5/23) Goal status: IN PROGRESS  2.  Patient will improve their 5x Sit to Stand score to less than 18 seconds to demonstrate a decreased risk for falls and improved LE strength.   Baseline: 22.45" without UE support/unsteady WBOS; 19.27 seconds, pt is steady (5/23) Goal status: IN PROGRESS  3.  Patient will improve TUG score to 17 seconds or less with LRAD to indicate a decreased risk of falls and demonstrate improved overall mobility.  Baseline: 21.27" with RW; 12.97 seconds w/ SPC (5/23) Goal status: MET  4.  Patient will improve gait speed to 0.87 m/s to indicate improvement to the level of community ambulator in order to participate more easily in activities outside of the home such as adapted golf.   Baseline: 0.77 m/s; 0.95 m/sec (5/23) Goal status: MET  5. Patient will improve BBS score to >/= 38/56 to demonstrate improved balance Baseline: 29/56; 40/56 (5/23) Goal status: MET  LONG TERM GOALS: Target date: 10/24/2022  Patient will report demonstrate independence with final HEP in order to maintain current gains and continue to progress after physical therapy discharge.   Baseline: To be provided Goal status: INITIAL  2.   Patient will improve their 5x Sit to Stand score to 15 seconds to demonstrate a decreased risk for falls and improved LE strength.   Baseline: 22.45" without UE support/unsteady WBOS Goal status: INITIAL  3.  Patient  will improve TUG score to 15 seconds or less with LRAD to indicate a decreased risk of falls and demonstrate improved overall mobility.  Baseline: 21.27" with RW; 12.97 seconds w/  SPC (5/23) Goal status: REVISED-MET 5/23  4.  Patient will improve gait speed to 1.0 m/s to indicate improvement to the level of community ambulator in order to participate more easily in activities outside of the home such as adapted golf.   Baseline: 0.77 m/s Goal status: INITIAL  5.  Patient will improve FOTO score to 57 to achieve predicted improvements in functional mobility due to skilled physical therapy interventions to increase safety with and participation in daily activities. Baseline: 49 Goal status: INITIAL  6.  Patient will improve BBS score to >/= 47/56 to demonstrate improved balance Baseline: 29/56 Goal status: INITIAL  ASSESSMENT:  CLINICAL IMPRESSION: Therapy session withheld today due to patient request following subjective.  He states he does not feel well and is having severe pain in both hands and left knee.  He did not want to attempt PT or ST at any capacity today so upon monitoring vitals which were WNL he was escorted to restroom as pt self-initiates leaving session.  Will re-assess and continue per POC as able at next session.  PT to communicate to ST regarding pt leaving.  OBJECTIVE IMPAIRMENTS: Abnormal gait, cardiopulmonary status limiting activity, decreased activity tolerance, decreased balance, decreased cognition, decreased coordination, decreased endurance, decreased knowledge of condition, decreased mobility, difficulty walking, decreased ROM, decreased strength, and impaired tone.   ACTIVITY LIMITATIONS: carrying, lifting, bending, sitting, standing, squatting, stairs, transfers, reach over head, locomotion level, and caring for others  PARTICIPATION LIMITATIONS: cleaning, driving, shopping, community activity, occupation, and yard work, Systems analyst  PERSONAL FACTORS: Past/current  experiences and 3+ comorbidities: see above  are also affecting patient's functional outcome, reliance on others for transportation   REHAB POTENTIAL: Good - may be limited by complex medical history  CLINICAL DECISION MAKING: Evolving/moderate complexity  EVALUATION COMPLEXITY: Moderate  PLAN:  PT FREQUENCY: 2x/week (dropped to 1x/wk on 5/23 per pt request)  PT DURATION: 8 weeks  PLANNED INTERVENTIONS: Therapeutic exercises, Therapeutic activity, Neuromuscular re-education, Balance training, Gait training, Patient/Family education, Self Care, Joint mobilization, Stair training, Vestibular training, DME instructions, Wheelchair mobility training, Manual therapy, and Re-evaluation  PLAN FOR NEXT SESSION: monitor vitals/orthostatic hypotension, add to HEP for balance prn, high intensity gait training, integrate golfing concepts as able, add in simple dual task to gait with SPC, vision exercises, dynamic balance, fluidity of movement, coordination tasks/reciprocal mobility/arm swing-golf club swing?  Sadie Haber, PT, DPT 10/08/2022, 12:47 PM

## 2022-10-09 ENCOUNTER — Telehealth: Payer: Self-pay | Admitting: *Deleted

## 2022-10-09 NOTE — Therapy (Deleted)
OUTPATIENT OCCUPATIONAL THERAPY NEURO TREATMENT  Patient Name: Christopher Yu MRN: [id number] DOB:01/14/1961, 62 y.o., male Today's Date: 10/06/2022  PCP: Thana Ates, MD REFERRING PROVIDER: Charlton Amor, PA-C  END OF SESSION:  OT End of Session - 10/06/22 1100     Visit Number 10    Number of Visits 20    Date for OT Re-Evaluation 06/20/22    Authorization Type [employer]    OT Start Time 1103    OT Stop Time 1145    OT Time Calculation (min) 42 min    Equipment Utilized During Treatment Single-point cane    Activity Tolerance Patient tolerated treatment well    Behavior During Therapy WFL for tasks assessed/performed;Flat affect             Past Medical History:  Diagnosis Date   Anxiety    Arthritis, rheumatoid (HCC)    dx 1988   CAD (coronary artery disease), native coronary artery    Mild LAD disease with calcification noted at cath 12//27/16    Cardiomyopathy (HCC)    Cardiomyopathy (HCC)    Chronic systolic heart failure (HCC) 05/01/2015   Depression    Deviated nasal septum 06/25/2011   Headache    High cholesterol    Hyperlipidemia    Impingement syndrome of left shoulder 12/22/2017   Macular degeneration    Nausea and vomiting 08/28/2022   Rheumatoid aortitis    Sleep apnea    does not use cpap - UNABLE TO TOLERATE MASK   Sleep apnea in adult    deviated septum repaired, most recent sleep study was negative   Status post total right knee replacement 06/01/2015   Past Surgical History:  Procedure Laterality Date   ANKLE FUSION Right 05/05/2012   related to his arthritis   ANKLE FUSION Left 05/25/2013   related to his arthritis   ANKLE SURGERY Bilateral    APPLICATION OF CRANIAL NAVIGATION N/A 04/11/2022   Procedure: APPLICATION OF CRANIAL NAVIGATION;  Surgeon: Lisbeth Renshaw, MD;  Location: [facility] OR;  Service: Neurosurgery;  Laterality: N/A;   APPLICATION OF CRANIAL NAVIGATION N/A 08/08/2022   Procedure: APPLICATION OF CRANIAL  NAVIGATION;  Surgeon: Lisbeth Renshaw, MD;  Location: [facility] OR;  Service: Neurosurgery;  Laterality: N/A;   CARDIAC CATHETERIZATION N/A 05/01/2015   Procedure: Left Heart Cath and Coronary Angiography;  Surgeon: Lyn Records, MD;  Location: [facility] INVASIVE CV LAB;  Service: Cardiovascular;  Laterality: N/A;   CRANIOTOMY N/A 04/11/2022   Procedure: STEREOTACTIC SUBOCCIPITAL CRANIOTOMY FOR RESECTION OF ARTERIO-VENOUS MALFORMATION;  Surgeon: Lisbeth Renshaw, MD;  Location: [facility] OR;  Service: Neurosurgery;  Laterality: N/A;   FRACTURE SURGERY     left femur fracture x 3   IR ANGIO EXTERNAL CAROTID SEL EXT CAROTID BILAT MOD SED  01/28/2022   IR ANGIO INTRA EXTRACRAN SEL INTERNAL CAROTID BILAT MOD SED  01/28/2022   IR ANGIO INTRA EXTRACRAN SEL INTERNAL CAROTID BILAT MOD SED  04/16/2022   IR ANGIO VERTEBRAL SEL VERTEBRAL BILAT MOD SED  01/28/2022   IR ANGIO VERTEBRAL SEL VERTEBRAL BILAT MOD SED  04/16/2022   KNEE ARTHROSCOPY     right x 4   LAPAROSCOPIC REVISION VENTRICULAR-PERITONEAL (V-P) SHUNT N/A 08/08/2022   Procedure: LAPAROSCOPIC VENTRICULAR-PERITONEAL (V-P) SHUNT;  Surgeon: Harriette Bouillon, MD;  Location: [facility] OR;  Service: General;  Laterality: N/A;   LUMBAR LAMINECTOMY/DECOMPRESSION MICRODISCECTOMY N/A 07/18/2022   Procedure: Repair of Pseudomeningiocele posterior;  Surgeon: Lisbeth Renshaw, MD;  Location: [facility] OR;  Service: Neurosurgery;  Laterality: N/A;  NASAL SEPTOPLASTY W/ TURBINOPLASTY  06/25/2011   Procedure: NASAL SEPTOPLASTY WITH TURBINATE REDUCTION;  Surgeon: Osborn Coho, MD;  Location: [facility] OR;  Service: ENT;  Laterality: Bilateral;   PLACEMENT OF LUMBAR DRAIN N/A 07/18/2022   Procedure: PLACEMENT OF LUMBAR DRAIN;  Surgeon: Lisbeth Renshaw, MD;  Location: [facility] OR;  Service: Neurosurgery;  Laterality: N/A;   TONSILLECTOMY     TOTAL KNEE ARTHROPLASTY Right 06/01/2015   Procedure: RIGHT TOTAL KNEE ARTHROPLASTY;  Surgeon: Kathryne Hitch, MD;  Location: [facility] ORS;  Service:  Orthopedics;  Laterality: Right;  Block+general   VENTRICULOPERITONEAL SHUNT Right 08/08/2022   Procedure: LAP ASSITED SHUNT INSERTION VENTRICULAR-PERITONEAL;  Surgeon: Lisbeth Renshaw, MD;  Location: [facility] OR;  Service: Neurosurgery;  Laterality: Right;   Patient Active Problem List   Diagnosis Date Noted   Nausea and vomiting 08/28/2022   Adjustment disorder 08/06/2022   Protein-calorie malnutrition, severe 07/31/2022   Cerebellar stroke (HCC) 07/29/2022   Ischemic cerebrovascular accident (CVA) (HCC) 07/28/2022   Prolonged QT interval 07/28/2022   Pseudomeningocele 07/18/2022   Pseudomeningocele due to surgical procedure 07/18/2022   Status post craniotomy 04/11/2022   AVM (arteriovenous malformation) brain 04/11/2022   Impingement syndrome of left shoulder 12/22/2017   Impingement syndrome of right shoulder 12/22/2017   Rheumatoid arthritis involving right knee (HCC) 06/01/2015   Status post total right knee replacement 06/01/2015   Cardiomyopathy (HCC) 05/02/2015   Hyperlipidemia    Sleep apnea in adult    Rheumatoid arthritis (HCC)    Chronic systolic heart failure (HCC) 05/01/2015   CAD (coronary artery disease), native coronary artery     ONSET DATE: 08/21/2022 (date of referral)  REFERRING DIAG: I63.9 (ICD-10-CM) - Cerebral infarction, unspecified  THERAPY DIAG:  Muscle weakness (generalized)  Unsteadiness on feet  Visuospatial deficit  Attention and concentration deficit  Rationale for Evaluation and Treatment: Rehabilitation  SUBJECTIVE:   SUBJECTIVE STATEMENT: *** Pt reports feeling well overall. Discussed more about when patient has LUE tremors. Please see intervention below.  Pt accompanied by: self and significant other, [name]. Daughter also present  PERTINENT HISTORY: CT/MRI showed new small focus of acute infarction in the right superior cerebellar hemisphere. Cerebellar AVM resection 12/23, pseudomeningocele s/p craniotomy 3/24, acute R cerebellar  infarct, hydrocephalus and VP shunt was placed on 08/08/22, dysphagia, dysarthria, aphasia. PMH: rheumatoid arthritis, bilateral ankle fusion (2014 and 2015), cardiomyopathy, CAD, R TKA, cardiomyopathy, chronic systolic heart failure, macular degeneration, OSA on CPAP  PRECAUTIONS: Fall and Other: swallow precautions with modification with left head tilt and supervision if drinking thinned liquids, per caregiver not to lift, history of orthostatic hypotension resulting in falls; no driving  WEIGHT BEARING RESTRICTIONS: No  PAIN:  Are you having pain? No, denies headaches  FALLS: Has patient fallen in last 6 months? Yes. Number of falls 3 - after the first surgery, got dizzy and fell, using the walker, when up and moving, struggled with orthostatic hypotension but medical team has since decreased BP medications  LIVING ENVIRONMENT: Lives with: lives with their spouse, two teenage daughters (also has dtr who lives in [place]), and 175 lb dog Lives in: House/apartment - Able to live on main floor Stairs: Yes: Internal: ~14 steps; on right going up and External: 3 steps; on right going up Has following equipment at home: Dan Humphreys - 2 wheeled and Wheelchair (manual) Bathroom setup: [employer], regular height toilet  PLOF: Prior to December, pt completely independent with ADL/IADLs, ambulatory without device, pt enjoys golfing, driving, 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  PATIENT GOALS: Return to golf, improve vision for work, travel in June for daughter's outdoor wedding (be off the walker ideally)  OBJECTIVE: (results from evaluation unless otherwise stated)  HAND DOMINANCE: Right  ADLs: Eating: Independent with self-feeding, wife cuts items due to impaired coordination per her report Grooming: Independent mostly, but reports it's painful to brush hair from surgical procedure in December UB Dressing: Supervision, increased time and wife present if needed LB Dressing: Supervision increased  time and wife present if needed. Sometimes sits to don pants Toileting: Supervision, more effort, wife there in case pt loses balance Bathing: Supervision for balance Tub Shower transfers: Supervision likely; however, sponge bath currently due to hand held shower head breaking and plumber coming 5/1.  Equipment:  Toilet riser with armrests , shower chair, bedrail on bed  IADLs: Shopping: Currently unable, previously independent Light housekeeping: Currently unable, previously did laundry Meal Prep: Currently unable, but previously assisted in task Community mobility: and household, pt utilizing rolling walker at all times. Typically sedentary in recliner most of the day. Gets up for bathroom.  Medication management: Wife Presenter, broadcasting: Currently unable, pt typically did before Handwriting: 75% legible and Mild micrographia   MOBILITY STATUS:  Pt ambulatory with RW, wife providing supervision  at all times  POSTURE COMMENTS:  rounded shoulders Sitting balance: Moves/returns truncal midpoint 1-2 inches in multiple planes - Wife describes she believes it is more of a coordination issue, overshooting and then losing balance.   ACTIVITY TOLERANCE: Activity tolerance: Subjective report of significant fatigue  FUNCTIONAL OUTCOME MEASURES: FOTO: 58 score  UPPER EXTREMITY ROM:    Active ROM Right eval Left eval  Shoulder flexion [facility]  Shoulder abduction [facility]  Shoulder adduction [facility] WFL  Shoulder extension [facility]  Shoulder internal rotation    Shoulder external rotation    Elbow flexion WFL WFL  Elbow extension [facility] WFL  Wrist flexion    Wrist extension    Wrist ulnar deviation    Wrist radial deviation    Wrist pronation    Wrist supination    (Blank rows = not tested)  UPPER EXTREMITY MMT:     MMT Right eval Left eval  Shoulder flexion 4+/5 4+/5  Shoulder abduction    Shoulder adduction    Shoulder extension    Shoulder internal rotation     Shoulder external rotation    Middle trapezius    Lower trapezius    Elbow flexion 4+/5 4+/5  Elbow extension 4+/5 4+/5  Wrist flexion    Wrist extension    Wrist ulnar deviation    Wrist radial deviation    Wrist pronation    Wrist supination    (Blank rows = not tested)  HAND FUNCTION: Grip strength: Right: 67.2 lbs; Left: 66.3 lbs and 3 point pinch: Right: 12 lbs, Left: 15 lbs  COORDINATION: 9 Hole Peg test: Right: 31 sec; Left: 59 sec Box and Blocks:  Right 27 blocks, Left 19blocks  SENSATION: WFL  EDEMA: None  MUSCLE TONE: WFL  COGNITION: Overall cognitive status:  Grossly WFL, possible for attention deficits.  VISION: Subjective report: Pt reports some jerky vision since December Baseline vision: Wears glasses all the time Visual history: cataracts and macular degeneration  VISION ASSESSMENT: Impaired Eye alignment: Impaired: Right eye seemed to have amblyopia-type appearance with visual pursuits  Reading acuity: Pt reports difficulty reading instructions at this time, he reports all letters merge together Tracking/Visual pursuits: Right eye appears to be slower than left Saccades:  overshoots - right eye from temporal to nasal Convergence: WFL Visual Fields: Grossly intact; however, decreased peripheral vision to right side when provided with moving target with detection of object at 30* Diplopia assessment: When given macular degeneration Amsler grid, pt reports double vision of right eye when left eye occluded. Quadrants - WFL   Patient has difficulty with following activities due to following visual impairments: Work related tasks  PERCEPTION: WFL  PRAXIS: WFL  OBSERVATIONS: Patient arrives to session ambulating with rolling walker. Patient wife primarily speaks during the session given patient's dysarthria. Per caregiver, patient had a surgery to address AVM and then had pseudomengiocele and underwent 3 surgeries as a result of complications. Per  report, patient was D/C from hospital and then returned with symptoms of acute stroke. Prior to December patient was fully independent, working full time in IT, and golfing regularly every weekend; however, patient is now fully supervised for all ADLs, not working, and now mobilizing either via manual W/C or walker. Patient reports increased nausea disrupting appetite. Family reports going to the ED on last Wednesday due to new reports of nausea and double vision and there was concern for another stroke; however, family decided to the return home when CT came back clear and did not want to go through additional MRI. Per family, inpatient rehab physician aware of this and recommended they call their neurosurgeon for follow up as well and did not advise against continuing outpatient therapy. Family also expresses concern in not knowing how to truly recognize a stroke as patient now has such extensive deficits. Family also reports that a therapist in inpatient rehab noticed unique eye movements and recommend they also see a therapist that could help address vestibular concerns. Wife showing OT video today. Video showed pt performing visual pursuits with jerky eye movement going from left to right sides, right eye impacted.  09/09/22: Pt received from PT lying on mat table. Pt stating taking a rest break. Pt denies pain or dizziness.  09/30/22: Pt ambulatory with SPC and well-kept. Pt wearing glasses. 10/06/22: Similar appearance   TODAY'S TREATMENT:                                                                                                                              DATE: *** Self-care: Education provided regarding possible clinical reasons pt experiencing LUE tremoring based on pt and wife observation over last week. They identified mostly when patient is reaching for items. After observing pt grasp water bottle and noticing the slight tremor, it is likely fine motor control and coordination is the issue.  Pt has WNL strength BUE at 5/5 MMT for shoulder flexion/extension, and elbow flexion/extension. Wanted to rule out muscle strength being the issue. Pt hesitant towards certain treatment ideas this visit, specifically Blaze Pods. He reports he feels like a dog being trained with this modality. Education provided on reason for use; however, based on patient's desired goals, clinical decision to refrain  from use of Blaze Pods, but will continue to assess if appropriate. Pt also desiring to help more with driving; however, has not been cleared to drive. Education provided for driver's rehab, and what may be the process to be able to drive again. Pt and wife to discuss and determine best next steps. Education provided today for remaining OT goals.  Therapeutic activity: Pt engaging in standing activities to include grasping large pegs with left hand and placing on peg board that was placed on vertical surface. Pt then placed all pegs back into bowl. Pt then putting away all items used to monitor balance and muscle endurance to LUE. Pt then ambulating 200' utilizing [facility] with supervision for safety. Total time 10:04 minutes with report 3/10 RPE scale once activity completed.  PATIENT EDUCATION: Education details: Progress note, driver's rehab Education provided to: Patient, spouse Delivered by: Verbal cues, handouts, verbal cues Pt receptive   HOME EXERCISE PROGRAM: 09/02/22: Coordination HEP 09/04/22: Modified coordination HEP (for vision) and vision HEP 09/18/22: No hand out provided but encouraged to work on standing activities throughout the day and combine with table top ball rolling for shoulder ROM. 09/25/22: None 09/30/22: None 10/06/22: Driver's rehab  GOALS: Goals reviewed with patient? Yes  SHORT TERM GOALS: Target date: 09/30/2022   Pt will be independent in LUE HEP. Baseline: Initiated 09/25/22: Completing independently Goal status: MET  2.  Pt will demonstrate improved fine motor  coordination for ADLs as evidenced by decreasing 9 hole peg test score for left hand by >/=5 secs  Baseline: Lt - 59 secs 09/25/22: Lt hand - 43 secs Goal status: MET  3.  Pt will be able to place at least 3 more blocks using left hand with completion of Box and Blocks test.  Baseline: Lt- 19 blocks 09/25/22: Lt - 31 blocks Goal status: MET  4.  Pt will tolerate >/=10 minutes of standing activity to simulate activity tolerance needed for ADL/IADL management. Baseline: Subjective description of sitting down for most tasks 10/06/22: 10:04 minutes of activity Goal status: MET  5.  Pt will write a sentence with no significant decrease in size and maintain 100% legibility.  Baseline: 75% legibility and mild micrographia 09/30/22: 100% legible and normal size Goal status: MET  6.  Pt will improve visual closure skills to recognize and complete visual patterns or missing parts of visual stimuli, enhancing visual perception and problem-solving abilities. Baseline: Overshooting eye movements, impaired peripheral vision, merging of letters per subjective report 09/2822: Visual closure worksheet 3/6 correct Goal status: IN PROGRESS  LONG TERM GOALS: Target date: 11/11/2022   Pt will complete FOTO assessment at time of discharge scoring 68 or greater indicating functional progression with ADL and IADL completion. Baseline: 58  09/30/22: 61 Goal status: IN PROGRESS  2.  Pt will demonstrate improved fine motor coordination for ADLs as evidenced by decreasing 9 hole peg test score for left hand by >/=10 secs  Baseline: Lt - 59 secs 09/25/22: Lt - 43 secs Goal status: MET  3.  Pt will be able to place at least 15 more blocks using left hand with completion of Box and Blocks test.  Baseline: Lt - 19 blocks 09/25/22: Lt- 31 blocks Goal status: REVISED  4.  Pt will improve dynamic standing balance to Good (stand independently unsupported, able to weight shift and cross midline moderately) as needed for  managing ADL/IADL tasks. Baseline: Subjective description of Fair (stand independently unsupported, weight shift, and reach ipsilaterally, LOB when crossing midline) with  rolling walker 09/30/22: Good Goal status: MET  5.  Pt will verbalize at least two visual compensatory methods to include adaptive equipment as needed independently. Baseline:  09/30/22: Education provided Goal status: IN PROGRESS   ASSESSMENT:  CLINICAL IMPRESSION: *** Pt demonstrating overall progress when reviewing STGs and LTGs meeting another goal this week for progress report. Fine motor coordination goal revised this date. Pt remains limited by impaired fine motor control/coordination, and visual skills. Pt concerned about driving. Education provided for driver's rehab and handout provided. Pt has been given various HEPs during this episode and compensatory strategies. Pt encouraged to incorporate LUE as much as possible in daily activity. Pt receptive. Pt would continue to benefit from skilled OT services to address remaining deficits impacting daily functioning.  PERFORMANCE DEFICITS: in functional skills including ADLs, IADLs, coordination, dexterity, pain, Fine motor control, Gross motor control, mobility, balance, body mechanics, endurance, vision, and UE functional use, cognitive skills including attention, energy/drive, and memory, and psychosocial skills including coping strategies, environmental adaptation, habits, and routines and behaviors.   IMPAIRMENTS: are limiting patient from ADLs, IADLs, work, and leisure.   CO-MORBIDITIES: may have co-morbidities  that affects occupational performance. Patient will benefit from skilled OT to address above impairments and improve overall function.  REHAB POTENTIAL: Good  PLAN:  OT FREQUENCY: 2x/week  OT DURATION: 10 weeks (anticipate 8 weeks, but pt going out of town for the summer months)  PLANNED INTERVENTIONS: self care/ADL training, therapeutic exercise,  therapeutic activity, neuromuscular re-education, balance training, functional mobility training, electrical stimulation, fluidotherapy, moist heat, cryotherapy, patient/family education, cognitive remediation/compensation, visual/perceptual remediation/compensation, psychosocial skills training, energy conservation, coping strategies training, DME and/or AE instructions, and Re-evaluation  RECOMMENDED OTHER SERVICES: Follow-up with neuro ophthalmologist completed. Pt reports having an "eye doctor" that he goes to annually.  CONSULTED AND AGREED WITH PLAN OF CARE: Patient and family member/caregiver  PLAN FOR NEXT SESSION:  ***  Continue standing tolerance for daily activities ie) golfing with personal putter (particular about which clubs go with which activities), balance of varying surfaces in prep for daughter's wedding, meal prep (as interest allows), toss ball back and forth if shoulder pain allows, continue visual activities, fine motor skills with small pegs, typing with dual computers, all fours positioning to taping to target?   [address] [place], [place] 10/06/2022, 12:05 PM

## 2022-10-09 NOTE — Telephone Encounter (Signed)
Oral swab drug screen was consistent for prescribed medications.  ?

## 2022-10-13 ENCOUNTER — Ambulatory Visit: Payer: BC Managed Care – PPO | Admitting: Occupational Therapy

## 2022-10-13 ENCOUNTER — Ambulatory Visit: Payer: BC Managed Care – PPO

## 2022-10-13 NOTE — Therapy (Deleted)
OUTPATIENT SPEECH LANGUAGE PATHOLOGY TREATMENT NOTE   Patient Name: Christopher Yu MRN: [id number] DOB:1960-07-02, 62 y.o., male Today's Date: 10/13/2022  PCP: Thana Ates, MD REFERRING PROVIDER: Charlton Amor, PA-C   END OF SESSION:         Past Medical History:  Diagnosis Date   Anxiety    Arthritis, rheumatoid (HCC)    dx 1988   CAD (coronary artery disease), native coronary artery    Mild LAD disease with calcification noted at cath 12//27/16    Cardiomyopathy (HCC)    Cardiomyopathy (HCC)    Chronic systolic heart failure (HCC) 05/01/2015   Depression    Deviated nasal septum 06/25/2011   Headache    High cholesterol    Hyperlipidemia    Impingement syndrome of left shoulder 12/22/2017   Macular degeneration    Nausea and vomiting 08/28/2022   Rheumatoid aortitis    Sleep apnea    does not use cpap - UNABLE TO TOLERATE MASK   Sleep apnea in adult    deviated septum repaired, most recent sleep study was negative   Status post total right knee replacement 06/01/2015   Past Surgical History:  Procedure Laterality Date   ANKLE FUSION Right 05/05/2012   related to his arthritis   ANKLE FUSION Left 05/25/2013   related to his arthritis   ANKLE SURGERY Bilateral    APPLICATION OF CRANIAL NAVIGATION N/A 04/11/2022   Procedure: APPLICATION OF CRANIAL NAVIGATION;  Surgeon: Lisbeth Renshaw, MD;  [facility];  Service: Neurosurgery;  Laterality: N/A;   APPLICATION OF CRANIAL NAVIGATION N/A 08/08/2022   Procedure: APPLICATION OF CRANIAL NAVIGATION;  Surgeon: Lisbeth Renshaw, MD;  [facility]: [facility];  Service: Neurosurgery;  Laterality: N/A;   CARDIAC CATHETERIZATION N/A 05/01/2015   Procedure: Left Heart Cath and Coronary Angiography;  Surgeon: Lyn Records, MD;  Location: [facility] INVASIVE CV LAB;  Service: Cardiovascular;  Laterality: N/A;   CRANIOTOMY N/A 04/11/2022   Procedure: STEREOTACTIC SUBOCCIPITAL CRANIOTOMY FOR RESECTION OF ARTERIO-VENOUS  MALFORMATION;  Surgeon: Lisbeth Renshaw, MD;  [facility];  Service: Neurosurgery;  Laterality: N/A;   FRACTURE SURGERY     left femur fracture x 3   IR ANGIO EXTERNAL CAROTID SEL EXT CAROTID BILAT MOD SED  01/28/2022   IR ANGIO INTRA EXTRACRAN SEL INTERNAL CAROTID BILAT MOD SED  01/28/2022   IR ANGIO INTRA EXTRACRAN SEL INTERNAL CAROTID BILAT MOD SED  04/16/2022   IR ANGIO VERTEBRAL SEL VERTEBRAL BILAT MOD SED  01/28/2022   IR ANGIO VERTEBRAL SEL VERTEBRAL BILAT MOD SED  04/16/2022   KNEE ARTHROSCOPY     right x 4   LAPAROSCOPIC REVISION VENTRICULAR-PERITONEAL (V-P) SHUNT N/A 08/08/2022   Procedure: LAPAROSCOPIC VENTRICULAR-PERITONEAL (V-P) SHUNT;  Surgeon: Harriette Bouillon, MD;  [facility];  Service: General;  Laterality: N/A;   LUMBAR LAMINECTOMY/DECOMPRESSION MICRODISCECTOMY N/A 07/18/2022   Procedure: Repair of Pseudomeningiocele posterior;  Surgeon: Lisbeth Renshaw, MD;  [facility];  Service: Neurosurgery;  Laterality: N/A;   NASAL SEPTOPLASTY W/ TURBINOPLASTY  06/25/2011   Procedure: NASAL SEPTOPLASTY WITH TURBINATE REDUCTION;  Surgeon: Osborn Coho, MD;  Location: [facility] OR;  Service: ENT;  Laterality: Bilateral;   PLACEMENT OF LUMBAR DRAIN N/A 07/18/2022   Procedure: PLACEMENT OF LUMBAR DRAIN;  Surgeon: Lisbeth Renshaw, MD;  [facility];  Service: Neurosurgery;  Laterality: N/A;   TONSILLECTOMY     TOTAL KNEE ARTHROPLASTY Right 06/01/2015   Procedure: RIGHT TOTAL KNEE ARTHROPLASTY;  Surgeon: Kathryne Hitch, MD;  Location: [facility]  ORS;  Service: Orthopedics;  Laterality: Right;  Block+general   VENTRICULOPERITONEAL SHUNT Right 08/08/2022   Procedure: LAP ASSITED SHUNT INSERTION VENTRICULAR-PERITONEAL;  Surgeon: Lisbeth Renshaw, MD;  [facility];  Service: Neurosurgery;  Laterality: Right;   Patient Active Problem List   Diagnosis Date Noted   Nausea and vomiting 08/28/2022   Adjustment disorder 08/06/2022   Protein-calorie malnutrition, severe  07/31/2022   Cerebellar stroke (HCC) 07/29/2022   Ischemic cerebrovascular accident (CVA) (HCC) 07/28/2022   Prolonged QT interval 07/28/2022   Pseudomeningocele 07/18/2022   Pseudomeningocele due to surgical procedure 07/18/2022   Status post craniotomy 04/11/2022   AVM (arteriovenous malformation) brain 04/11/2022   Impingement syndrome of left shoulder 12/22/2017   Impingement syndrome of right shoulder 12/22/2017   Rheumatoid arthritis involving right knee (HCC) 06/01/2015   Status post total right knee replacement 06/01/2015   Cardiomyopathy (HCC) 05/02/2015   Hyperlipidemia    Sleep apnea in adult    Rheumatoid arthritis (HCC)    Chronic systolic heart failure (HCC) 05/01/2015   CAD (coronary artery disease), native coronary artery     ONSET DATE: 07/28/22  REFERRING DIAG:  I63.9 (ICD-10-CM) - Cerebral infarction, unspecified   THERAPY DIAG:  No diagnosis found.  Rationale for Evaluation and Treatment: Rehabilitation  SUBJECTIVE:   SUBJECTIVE STATEMENT: "***  PAIN:  Are you having pain? Yes NPRS scale: 8/10 Pain location: right shoulder Pain orientation: Right  PAIN TYPE: aching Pain description: constant  Aggravating factors: movement Relieving factors: rest   OBJECTIVE:   TODAY'S TREATMENT:                                                                                                                                         DATE:   10/13/22:  10/06/22: Wife, Christopher Yu and daughter Christopher Yu present. Christopher Yu reports concern re: Christopher Yu running out of air when talking and before starting to talk, audible breaths. Christopher Yu endorses both. Christopher Yu continues to endorse coughing with PO "occasionally" Today, he required cues to completed left head turn with water sips, resulting in coughing 1/4 sips. Due to pt concerns re: breath support, measured Maximum Expiratory Pressure (MEP) and Maximum Inspiratory Pressure (MIP) using pressure manometer, nose plugs. Christopher Yu's MEP was 111 cm/H2O; WNL  for his age is 10. MIP was 104, WNL 94. Targeted dysphagia, speech training Christopher Yu on EMST 150, set at 70 cm/H20, approx 70% of his MEP, goal to reach 80% of MEP. Christopher Yu demonstrated 5 sets of 5 reps. Christopher Yu verbalized understanding - handout provided - see pt instructions. Next session, observe with EMST, educate this will need to be done for 8 weeks min  10/02/22: Targeted conversation topics to practice increasing conversation length and use of dysarthria strategies during conversational speech. Led through debate topics to work on mental flexibility with mod-A of models and cues. Patient demonstrated mental rigidity and required max cues to consider additional perspectives and complete x1  scenario. Spouse mentions that this has been consistent in the past, but that patient is more blunt since the stroke. Led through safe conversation topics to discuss during the party including, education, family. Role play to work on Mudlogger language for future party. HEP sent of ways to avoid un-preferred conversation topics.   09/30/22: Patient has a graduation party this weekend. He plans on avoiding people and not talking. Spouse, and SLP encouraged him to speak more and to plan topics beforehand. Led through answering questions and problem solving. Was able to speak in 30 minute conversation with min-A, and encouragement. Intelligibility at 90%, with occasional collapse of longer words, however, patient was able to clear throat and self correct. Voice sounded less groggy/wet when compared to last week. Patient spoke more with desired topics, ie golf, politics between Christopher Yu and the [place]    09/25/22: Led through cognitive exercises to state similarities and differences between items with min-A. Patient carried over slow rate of speech from structured task to conversational speech through telling of a story of friends. Speech clear, with min-cuing. Pt states that speech sounds clear when asked to self monitor. Unable to  do HEP sent home 5/20 due to graduation. States that graduation required lots of talking and that he feels like it went well. SLP noted voice quality to be wet and groggy, that did not diminish with throat clear and water, however this did not decrease intelligibility.    5/20/24Jacquenette Yu with limited completion of HEP at home as they had company from out of town. He completed HEP using slow rate, over articulation repeating multi-syllabic words and generating sentence with the word with occasional min verbal cues and modeling. Speech today more slurred than last session - suspect fatigue due to company this weekend. In structured task generating 3 sentence salient description of vegetables, Christopher Yu carried over slow rate and over articulation requiring 2 verbal cues to ID unintelligible words (out of 15 sentences) and to repeat intelligibly. In repetition task (due to reduced vision) Christopher Yu stated 4 words and Id'd the item that did not belong and explained way using intelligible speech 10/10x. In conversation re: grilling/cooking, he maintained intelligible speech in this quiet environment, however as noted before, speech with increased imprecision and slur today. Sequencing homework provided - family will need to read this to him  09/18/22: Pt reports ongoing daily completion of effortful swallow to address dysphagia. Subjective improvement of swallow function per pt report with reduced instances of coughing with liquids and no longer using thickened liquids. In structured practice targeting tri-syllabic words at generative sentence level, pt with 100% accuracy. Practice naturally occurring in conversational speech results in ~70% accuracy of 3+ syllable words with increased challenges endorsed by pt. Barrier to communication at home is pt resistant to repeating himself. Education provided regarding increased complexity of using strategies in conversation to encourage pt to give himself grace when breakdowns  occur. Add oral reading to HEP, if able d/t vision concerns related to dry eyes.      PATIENT EDUCATION: Education details: POC, goals, evaluation results/recommendations Person educated: Patient and Spouse Education method: Explanation, Demonstration, Verbal cues, and Handouts Education comprehension: verbalized understanding, returned demonstration, and needs further education     GOALS: Goals reviewed with patient? Yes   SHORT TERM GOALS: Target date: 10/03/2022   Pt will teach back dysarthria strategies and compensations with mod-I Baseline: Goal status: MET   2.   Pt will employ dysarthria strategies during structured practice activities resulting in  intelligible production at mult-sentence level in 80% of trials Baseline:  Goal status: MET   3. Pt will demonstrate use of swallow compensations/strategies with PO trials with mod-I over 2 sessions  Baseline:  Goal status: PARTIALLY MET   4.  Pt will complete HEP 6/7 days over 1 week period with mod-I Baseline:  Goal status: MET     LONG TERM GOALS: Target date: 10/24/2022   Pt will complete repeat MBSS Baseline:  Goal status: ONGOING   2.  Pt will carryover dysarthria strategies/compensations during 20 minute conversation resulting in 90% ineligibility  Baseline:  Goal status: ONGOING   3.  Pt and spouse will report subjective report of reduced need for requests for repetition by 50% at home, over 1 week period Baseline:  Goal status:ONGOING   4.  Pt will report improvement via PROM by dc  Baseline:  Goal status: ONGOING     ASSESSMENT:   CLINICAL IMPRESSION: Patient is a 62 y.o. M who was seen today for dysphagia and motor speech eval s/p cerebellar stroke. Evaluation reveals moderate dysarthria and recent MBSS demonstrates mild pharyngeal dysphagia. Pt's speech is c/b irregular imprecise articulation and distortions, volume decay on expanded utterances, and reduced use of prosody. Pt reports frustration  regarding his communicative abilities, tells SLP his vocation has high level of demands for communication and he feels he will "never get back there, it's impossible." As evaluation progressed, pt with increased frustration with SLP providing education to encourage participation and positive mindset for engagement in therapy course. Brief introduction to dysarthria strategies initiated. Reviewed instrumental swallow study results and updated pt's dysphagia HEP to accommodate preferences while targeting demonstrated deficits. Will plan to request repeat MBSS following usual completion of dysphagia HEP over 1-2 month period. Pt would benefit from skilled ST to address aforementioned deficits to improve QoL, enhance communication efficacy, and facilitate improved swallow function/safety .      OBJECTIVE IMPAIRMENTS: include dysarthria and dysphagia. These impairments are limiting patient from return to work, effectively communicating at home and in community, and safety when swallowing. Factors affecting potential to achieve goals and functional outcome are cooperation/participation level. Patient will benefit from skilled SLP services to address above impairments and improve overall function.   REHAB POTENTIAL: Good   PLAN:   SLP FREQUENCY: 2x/week   SLP DURATION: 8 weeks   PLANNED INTERVENTIONS: Aspiration precaution training, Pharyngeal strengthening exercises, Diet toleration management , Trials of upgraded texture/liquids, Cueing hierachy, Internal/external aids, Functional tasks, Multimodal communication approach, SLP instruction and feedback, Compensatory strategies, Patient/family education, and Re-evaluation     Gracy Racer, CCC-SLP 10/13/2022, 7:50 AM

## 2022-10-15 ENCOUNTER — Ambulatory Visit: Payer: BC Managed Care – PPO | Admitting: Speech Pathology

## 2022-10-15 ENCOUNTER — Encounter: Payer: BC Managed Care – PPO | Admitting: Occupational Therapy

## 2022-10-15 ENCOUNTER — Ambulatory Visit: Payer: BC Managed Care – PPO | Admitting: Physical Therapy

## 2022-10-16 NOTE — Progress Notes (Deleted)
Subjective:    Patient ID: Christopher Yu, male    DOB: Jul 03, 1960, 62 y.o.   MRN: [id number]  HPI   Pain Inventory Average Pain {NUMBERS; 0-10:5044} Pain Right Now {NUMBERS; 0-10:5044} My pain is {PAIN DESCRIPTION:21022940}  In the last 24 hours, has pain interfered with the following? General activity {NUMBERS; 0-10:5044} Relation with others {NUMBERS; 0-10:5044} Enjoyment of life {NUMBERS; 0-10:5044} What TIME of day is your pain at its worst? {time of day:24191} Sleep (in general) {BHH GOOD/FAIR/POOR:22877}  Pain is worse with: {ACTIVITIES:21022942} Pain improves with: {PAIN IMPROVES JOAC:16606301} Relief from Meds: {NUMBERS; 0-10:5044}  Family History  Problem Relation Age of Onset   Lupus Sister    Social History   Socioeconomic History   Marital status: Married    Spouse name: Senecca Ferraiolo   Number of children: 4   Years of education: Not on file   Highest education level: Not on file  Occupational History   Not on file  Tobacco Use   Smoking status: Former    Packs/day: 0.50    Years: 20.00    Additional pack years: 0.00    Total pack years: 10.00    Types: Cigarettes    Quit date: 05/24/2008    Years since quitting: 14.4   Smokeless tobacco: Never   Tobacco comments:    stopped smoking 8 yrs ago.  Vaping Use   Vaping Use: Never used  Substance and Sexual Activity   Alcohol use: Yes    Comment: rarely   Drug use: No   Sexual activity: Not Currently  Other Topics Concern   Not on file  Social History Narrative   Not on file   Social Determinants of Health   Financial Resource Strain: Not on file  Food Insecurity: No Food Insecurity (07/29/2022)   Hunger Vital Sign    Worried About Running Out of Food in the Last Year: Never true    Ran Out of Food in the Last Year: Never true  Transportation Needs: No Transportation Needs (07/29/2022)   PRAPARE - Administrator, Civil Service (Medical): No    Lack of  Transportation (Non-Medical): No  Physical Activity: Not on file  Stress: Not on file  Social Connections: Not on file   Past Surgical History:  Procedure Laterality Date   ANKLE FUSION Right 05/05/2012   related to his arthritis   ANKLE FUSION Left 05/25/2013   related to his arthritis   ANKLE SURGERY Bilateral    APPLICATION OF CRANIAL NAVIGATION N/A 04/11/2022   Procedure: APPLICATION OF CRANIAL NAVIGATION;  Surgeon: Lisbeth Renshaw, MD;  [facility];  Service: Neurosurgery;  Laterality: N/A;   APPLICATION OF CRANIAL NAVIGATION N/A 08/08/2022   Procedure: APPLICATION OF CRANIAL NAVIGATION;  Surgeon: Lisbeth Renshaw, MD;  [facility]: [facility];  Service: Neurosurgery;  Laterality: N/A;   CARDIAC CATHETERIZATION N/A 05/01/2015   Procedure: Left Heart Cath and Coronary Angiography;  Surgeon: Lyn Records, MD;  Location: [facility] INVASIVE CV LAB;  Service: Cardiovascular;  Laterality: N/A;   CRANIOTOMY N/A 04/11/2022   Procedure: STEREOTACTIC SUBOCCIPITAL CRANIOTOMY FOR RESECTION OF ARTERIO-VENOUS MALFORMATION;  Surgeon: Lisbeth Renshaw, MD;  [facility];  Service: Neurosurgery;  Laterality: N/A;   FRACTURE SURGERY     left femur fracture x 3   IR ANGIO EXTERNAL CAROTID SEL EXT CAROTID BILAT MOD SED  01/28/2022   IR ANGIO INTRA EXTRACRAN SEL INTERNAL CAROTID BILAT MOD SED  01/28/2022   IR ANGIO INTRA EXTRACRAN SEL INTERNAL CAROTID  BILAT MOD SED  04/16/2022   IR ANGIO VERTEBRAL SEL VERTEBRAL BILAT MOD SED  01/28/2022   IR ANGIO VERTEBRAL SEL VERTEBRAL BILAT MOD SED  04/16/2022   KNEE ARTHROSCOPY     right x 4   LAPAROSCOPIC REVISION VENTRICULAR-PERITONEAL (V-P) SHUNT N/A 08/08/2022   Procedure: LAPAROSCOPIC VENTRICULAR-PERITONEAL (V-P) SHUNT;  Surgeon: Harriette Bouillon, MD;  [facility];  Service: General;  Laterality: N/A;   LUMBAR LAMINECTOMY/DECOMPRESSION MICRODISCECTOMY N/A 07/18/2022   Procedure: Repair of Pseudomeningiocele posterior;  Surgeon: Lisbeth Renshaw, MD;   [facility];  Service: Neurosurgery;  Laterality: N/A;   NASAL SEPTOPLASTY W/ TURBINOPLASTY  06/25/2011   Procedure: NASAL SEPTOPLASTY WITH TURBINATE REDUCTION;  Surgeon: Osborn Coho, MD;  Location: [facility] OR;  Service: ENT;  Laterality: Bilateral;   PLACEMENT OF LUMBAR DRAIN N/A 07/18/2022   Procedure: PLACEMENT OF LUMBAR DRAIN;  Surgeon: Lisbeth Renshaw, MD;  [facility];  Service: Neurosurgery;  Laterality: N/A;   TONSILLECTOMY     TOTAL KNEE ARTHROPLASTY Right 06/01/2015   Procedure: RIGHT TOTAL KNEE ARTHROPLASTY;  Surgeon: Kathryne Hitch, MD;  Location: [facility] ORS;  Service: Orthopedics;  Laterality: Right;  Block+general   VENTRICULOPERITONEAL SHUNT Right 08/08/2022   Procedure: LAP ASSITED SHUNT INSERTION VENTRICULAR-PERITONEAL;  Surgeon: Lisbeth Renshaw, MD;  [facility];  Service: Neurosurgery;  Laterality: Right;   Past Surgical History:  Procedure Laterality Date   ANKLE FUSION Right 05/05/2012   related to his arthritis   ANKLE FUSION Left 05/25/2013   related to his arthritis   ANKLE SURGERY Bilateral    APPLICATION OF CRANIAL NAVIGATION N/A 04/11/2022   Procedure: APPLICATION OF CRANIAL NAVIGATION;  Surgeon: Lisbeth Renshaw, MD;  [facility];  Service: Neurosurgery;  Laterality: N/A;   APPLICATION OF CRANIAL NAVIGATION N/A 08/08/2022   Procedure: APPLICATION OF CRANIAL NAVIGATION;  Surgeon: Lisbeth Renshaw, MD;  [facility];  Service: Neurosurgery;  Laterality: N/A;   CARDIAC CATHETERIZATION N/A 05/01/2015   Procedure: Left Heart Cath and Coronary Angiography;  Surgeon: Lyn Records, MD;  Location: [facility] INVASIVE CV LAB;  Service: Cardiovascular;  Laterality: N/A;   CRANIOTOMY N/A 04/11/2022   Procedure: STEREOTACTIC SUBOCCIPITAL CRANIOTOMY FOR RESECTION OF ARTERIO-VENOUS MALFORMATION;  Surgeon: Lisbeth Renshaw, MD;  [facility];  Service: Neurosurgery;  Laterality: N/A;   FRACTURE SURGERY     left femur fracture x 3   IR ANGIO EXTERNAL  CAROTID SEL EXT CAROTID BILAT MOD SED  01/28/2022   IR ANGIO INTRA EXTRACRAN SEL INTERNAL CAROTID BILAT MOD SED  01/28/2022   IR ANGIO INTRA EXTRACRAN SEL INTERNAL CAROTID BILAT MOD SED  04/16/2022   IR ANGIO VERTEBRAL SEL VERTEBRAL BILAT MOD SED  01/28/2022   IR ANGIO VERTEBRAL SEL VERTEBRAL BILAT MOD SED  04/16/2022   KNEE ARTHROSCOPY     right x 4   LAPAROSCOPIC REVISION VENTRICULAR-PERITONEAL (V-P) SHUNT N/A 08/08/2022   Procedure: LAPAROSCOPIC VENTRICULAR-PERITONEAL (V-P) SHUNT;  Surgeon: Harriette Bouillon, MD;  [facility];  Service: General;  Laterality: N/A;   LUMBAR LAMINECTOMY/DECOMPRESSION MICRODISCECTOMY N/A 07/18/2022   Procedure: Repair of Pseudomeningiocele posterior;  Surgeon: Lisbeth Renshaw, MD;  [facility];  Service: Neurosurgery;  Laterality: N/A;   NASAL SEPTOPLASTY W/ TURBINOPLASTY  06/25/2011   Procedure: NASAL SEPTOPLASTY WITH TURBINATE REDUCTION;  Surgeon: Osborn Coho, MD;  Location: [facility] OR;  Service: ENT;  Laterality: Bilateral;   PLACEMENT OF LUMBAR DRAIN N/A 07/18/2022   Procedure: PLACEMENT OF LUMBAR DRAIN;  Surgeon: Lisbeth Renshaw, MD;  [facility];  Service: Neurosurgery;  Laterality: N/A;   TONSILLECTOMY     TOTAL KNEE ARTHROPLASTY Right 06/01/2015   Procedure: RIGHT TOTAL KNEE ARTHROPLASTY;  Surgeon: Kathryne Hitch, MD;  Location: [facility];  Service: Orthopedics;  Laterality: Right;  Block+general   VENTRICULOPERITONEAL SHUNT Right 08/08/2022   Procedure: LAP ASSITED SHUNT INSERTION VENTRICULAR-PERITONEAL;  Surgeon: Lisbeth Renshaw, MD;  [facility];  Service: Neurosurgery;  Laterality: Right;   Past Medical History:  Diagnosis Date   Anxiety    Arthritis, rheumatoid (HCC)    dx 1988   CAD (coronary artery disease), native coronary artery    Mild LAD disease with calcification noted at cath 12//27/16    Cardiomyopathy (HCC)    Cardiomyopathy (HCC)    Chronic systolic heart failure (HCC) 05/01/2015   Depression    Deviated  nasal septum 06/25/2011   Headache    High cholesterol    Hyperlipidemia    Impingement syndrome of left shoulder 12/22/2017   Macular degeneration    Nausea and vomiting 08/28/2022   Rheumatoid aortitis    Sleep apnea    does not use cpap - UNABLE TO TOLERATE MASK   Sleep apnea in adult    deviated septum repaired, most recent sleep study was negative   Status post total right knee replacement 06/01/2015   There were no vitals taken for this visit.  Opioid Risk Score:   Fall Risk Score:  `1  Depression screen PHQ 2/9      No data to display          Review of Systems     Objective:   Physical Exam        Assessment & Plan:

## 2022-10-17 DIAGNOSIS — H04123 Dry eye syndrome of bilateral lacrimal glands: Secondary | ICD-10-CM | POA: Diagnosis not present

## 2022-10-20 ENCOUNTER — Encounter: Payer: Self-pay | Admitting: Physical Medicine and Rehabilitation

## 2022-10-20 ENCOUNTER — Ambulatory Visit: Payer: BC Managed Care – PPO | Admitting: Occupational Therapy

## 2022-10-20 ENCOUNTER — Ambulatory Visit: Payer: BC Managed Care – PPO | Admitting: Speech Pathology

## 2022-10-20 ENCOUNTER — Encounter
Payer: BC Managed Care – PPO | Attending: Physical Medicine and Rehabilitation | Admitting: Physical Medicine and Rehabilitation

## 2022-10-20 ENCOUNTER — Ambulatory Visit: Payer: BC Managed Care – PPO | Admitting: Physical Therapy

## 2022-10-20 VITALS — BP 116/86 | HR 104 | Ht 75.0 in | Wt 181.0 lb

## 2022-10-20 DIAGNOSIS — F32A Depression, unspecified: Secondary | ICD-10-CM | POA: Insufficient documentation

## 2022-10-20 DIAGNOSIS — Z9889 Other specified postprocedural states: Secondary | ICD-10-CM | POA: Insufficient documentation

## 2022-10-20 DIAGNOSIS — R41840 Attention and concentration deficit: Secondary | ICD-10-CM | POA: Diagnosis not present

## 2022-10-20 DIAGNOSIS — G4700 Insomnia, unspecified: Secondary | ICD-10-CM | POA: Insufficient documentation

## 2022-10-20 DIAGNOSIS — I639 Cerebral infarction, unspecified: Secondary | ICD-10-CM | POA: Diagnosis not present

## 2022-10-20 DIAGNOSIS — R278 Other lack of coordination: Secondary | ICD-10-CM | POA: Diagnosis not present

## 2022-10-20 DIAGNOSIS — R1310 Dysphagia, unspecified: Secondary | ICD-10-CM | POA: Diagnosis not present

## 2022-10-20 DIAGNOSIS — R42 Dizziness and giddiness: Secondary | ICD-10-CM | POA: Diagnosis not present

## 2022-10-20 DIAGNOSIS — M069 Rheumatoid arthritis, unspecified: Secondary | ICD-10-CM | POA: Diagnosis not present

## 2022-10-20 DIAGNOSIS — F419 Anxiety disorder, unspecified: Secondary | ICD-10-CM | POA: Diagnosis not present

## 2022-10-20 DIAGNOSIS — R2681 Unsteadiness on feet: Secondary | ICD-10-CM

## 2022-10-20 DIAGNOSIS — R269 Unspecified abnormalities of gait and mobility: Secondary | ICD-10-CM | POA: Diagnosis not present

## 2022-10-20 DIAGNOSIS — R471 Dysarthria and anarthria: Secondary | ICD-10-CM | POA: Diagnosis not present

## 2022-10-20 DIAGNOSIS — M6281 Muscle weakness (generalized): Secondary | ICD-10-CM | POA: Diagnosis not present

## 2022-10-20 DIAGNOSIS — R41842 Visuospatial deficit: Secondary | ICD-10-CM | POA: Diagnosis not present

## 2022-10-20 MED ORDER — TRAZODONE HCL 50 MG PO TABS: 75.0000 mg | ORAL_TABLET | Freq: Every evening | ORAL | 3 refills | Status: DC | PRN

## 2022-10-20 MED ORDER — TRAMADOL HCL 50 MG PO TABS: 50.0000 mg | ORAL_TABLET | Freq: Two times a day (BID) | ORAL | 0 refills | Status: DC | PRN

## 2022-10-20 NOTE — Patient Instructions (Signed)
  Cerebellar stroke (HCC) Status post craniotomy Continue with therapies for now, continue energy conservation and use of rolling walker with significant exertion; okay to continue quad cane for short distances, as gait appears stable with this.  Continuing following up with Dr. Conchita Paris.  Incisional site looks good today, and Benadryl p.o. is causing increased lethargy during the daytime, therefore switch to topical Benadryl over-the-counter to the incision site as needed for itching and stop oral Benadryl.  Follow-up in 3 months.  Rheumatoid arthritis of other site, unspecified whether rheumatoid factor present [facility])  Follow-up with your rheumatologist for infusions in July; be aware that steroids can interfere with sleep quality and may be a factor in worsening insomnia  Refilled patient's tramadol 50 mg twice daily as needed, generally gets through Dr. Margaretann Loveless PCP, will message him to let him know of this in anticipation of next visit.  Patient is going out of town soon, therefore fill prescription today was appropriate.  Stop oxycodone.  Insomnia, unspecified type Anxiety and depression  Discussed risks and benefits of increasing versus adding medication, along with significant risk of serotonin syndrome with sertraline, tramadol, and trazodone.  Patient opted to increase trazodone today, sent prescription for 75 mg nightly as needed.  Will stop medication if any symptoms of serotonin syndrome and inform us immediately.  Did urged patient to get a repeat sleep study in the presence of history of OSA, and wife endorsing snoring at nighttime.  Defer follow-up to PCP.  Will defer increasing sertraline for anxiety/depression with increase trazodone as above.

## 2022-10-20 NOTE — Progress Notes (Signed)
Subjective:    Patient ID: Christopher Yu, male    DOB: Jan 24, 1961, 62 y.o.   MRN: [id number]  HPI   Christopher Yu is a 62 y.o. year old male  who  has a past medical history of Anxiety, Arthritis, rheumatoid (HCC), CAD (coronary artery disease), native coronary artery, Cardiomyopathy (HCC), Cardiomyopathy (HCC), Chronic systolic heart failure (HCC) (84/69/6295), Depression, Deviated nasal septum (06/25/2011), Headache, High cholesterol, Hyperlipidemia, Impingement syndrome of left shoulder (12/22/2017), Macular degeneration, Nausea and vomiting (08/28/2022), Rheumatoid aortitis, Sleep apnea, Sleep apnea in adult, and Status post total right knee replacement (06/01/2015).   They are presenting to PM&R clinic for follow up related to cerebellar AVM s/p resection 04/2022 -> c/b pseudomeningocele s/p repair 07/18/22-> hospitalization and IPR admission 3/25-4/23 for secondary cerebellar stroke and hydrocephalus s/p VP shunt 08/08/22 with Dr. Conchita Paris.   Plan from last visit: Cerebellar Stroke: Continue Outpatient Therapy at [facility]. He has a scheduled appointment with Neurosurgery. Continue to Monitor.  Hyperlipidemia: Continue current medication regimen. PCP following. Continue to monitor.  Pseudomeningocele: Neurosurgery following. Continue to Monitor.    Interval Hx:   - Therapies: Still in PT/OT/SLP; last week of therapies this week. He feels good about his progress overall. He missed the last 2 weeks of therapies because his RA has been flaring.   "Far" is very short; even showers will cause SOB and fatigue him significantly. He has tried to golf once since getting out, and it was too fatiguing.    - Follow ups: Saw Dr. Conchita Paris last week, and will see him again in a month. Last week they discussed tremors in his left arm, intermittent, usually with intention. They sent a referral to Dr. Arbutus Leas for movement clinic.    - Falls: None; has near misses  often because of fatigue. Uses furniture walking when he is tired. Also some spacial awareness issues, will sometimes grab the wall to know where it is in space.    - DME: Using a quad cane most of the time. "I try not to use a walker or wheelchair because it brings attention that I don't like, so I'd rather stumble with the cane".   He notes issues with one therapist using a gait belt because the therapist does not walk at his speed; they have worked with him with the cane but do recommend a walker for long distance or uneven ground.    - Medications: Oxycodone 5 mg tabs BID filled 09/08/22. Has stopped oxycodone, only taking tramadol at this point. They are going to [place] on Thursday and are asking for refill for tramadol; usually get it through Dr. Margaretann Loveless, seeing her again in July.   D/t RA flares, has been on several steroid tapers recently, just stopped a taper, they are still helping. He is overdue for his infusions, but rheumatologist wants to hold off until in-person appointment; couldn't get in until July.    Pain Inventory Average Pain 6 Pain Right Now 0 My pain is intermittent, constant, and aching  LOCATION OF PAIN  Headache  BOWEL Number of stools per week: 7 Oral laxative use No   BLADDER Normal   Mobility use a cane ability to climb steps?  yes do you drive?  no use a wheelchair transfers alone Do you have any goals in this area?  yes  Function disabled: date disabled applied for long-term disability  I need assistance with the following:  bathing, meal prep, household duties, and shopping Do you have any  goals in this area?  yes  Neuro/Psych tremor trouble walking confusion depression anxiety  Prior Studies Any changes since last visit?  no  Physicians involved in your care Any changes since last visit?  no   Family History  Problem Relation Age of Onset   Lupus Sister    Social History   Socioeconomic History   Marital status: Married     Spouse name: Robie Mcniel   Number of children: 4   Years of education: Not on file   Highest education level: Not on file  Occupational History   Not on file  Tobacco Use   Smoking status: Former    Packs/day: 0.50    Years: 20.00    Additional pack years: 0.00    Total pack years: 10.00    Types: Cigarettes    Quit date: 05/24/2008    Years since quitting: 14.4   Smokeless tobacco: Never   Tobacco comments:    stopped smoking 8 yrs ago.  Vaping Use   Vaping Use: Never used  Substance and Sexual Activity   Alcohol use: Yes    Comment: rarely   Drug use: No   Sexual activity: Not Currently  Other Topics Concern   Not on file  Social History Narrative   Not on file   Social Determinants of Health   Financial Resource Strain: Not on file  Food Insecurity: No Food Insecurity (07/29/2022)   Hunger Vital Sign    Worried About Running Out of Food in the Last Year: Never true    Ran Out of Food in the Last Year: Never true  Transportation Needs: No Transportation Needs (07/29/2022)   PRAPARE - Administrator, Civil Service (Medical): No    Lack of Transportation (Non-Medical): No  Physical Activity: Not on file  Stress: Not on file  Social Connections: Not on file   Past Surgical History:  Procedure Laterality Date   ANKLE FUSION Right 05/05/2012   related to his arthritis   ANKLE FUSION Left 05/25/2013   related to his arthritis   ANKLE SURGERY Bilateral    APPLICATION OF CRANIAL NAVIGATION N/A 04/11/2022   Procedure: APPLICATION OF CRANIAL NAVIGATION;  Surgeon: Lisbeth Renshaw, MD;  Location: [facility] OR;  Service: Neurosurgery;  Laterality: N/A;   APPLICATION OF CRANIAL NAVIGATION N/A 08/08/2022   Procedure: APPLICATION OF CRANIAL NAVIGATION;  Surgeon: Lisbeth Renshaw, MD;  Location: [facility] OR;  Service: Neurosurgery;  Laterality: N/A;   CARDIAC CATHETERIZATION N/A 05/01/2015   Procedure: Left Heart Cath and Coronary Angiography;  Surgeon: Lyn Records,  MD;  Location: [facility] INVASIVE CV LAB;  Service: Cardiovascular;  Laterality: N/A;   CRANIOTOMY N/A 04/11/2022   Procedure: STEREOTACTIC SUBOCCIPITAL CRANIOTOMY FOR RESECTION OF ARTERIO-VENOUS MALFORMATION;  Surgeon: Lisbeth Renshaw, MD;  Location: [facility] OR;  Service: Neurosurgery;  Laterality: N/A;   FRACTURE SURGERY     left femur fracture x 3   IR ANGIO EXTERNAL CAROTID SEL EXT CAROTID BILAT MOD SED  01/28/2022   IR ANGIO INTRA EXTRACRAN SEL INTERNAL CAROTID BILAT MOD SED  01/28/2022   IR ANGIO INTRA EXTRACRAN SEL INTERNAL CAROTID BILAT MOD SED  04/16/2022   IR ANGIO VERTEBRAL SEL VERTEBRAL BILAT MOD SED  01/28/2022   IR ANGIO VERTEBRAL SEL VERTEBRAL BILAT MOD SED  04/16/2022   KNEE ARTHROSCOPY     right x 4   LAPAROSCOPIC REVISION VENTRICULAR-PERITONEAL (V-P) SHUNT N/A 08/08/2022   Procedure: LAPAROSCOPIC VENTRICULAR-PERITONEAL (V-P) SHUNT;  Surgeon: Harriette Bouillon, MD;  Location: [facility]  OR;  Service: General;  Laterality: N/A;   LUMBAR LAMINECTOMY/DECOMPRESSION MICRODISCECTOMY N/A 07/18/2022   Procedure: Repair of Pseudomeningiocele posterior;  Surgeon: Lisbeth Renshaw, MD;  Location: [facility] OR;  Service: Neurosurgery;  Laterality: N/A;   NASAL SEPTOPLASTY W/ TURBINOPLASTY  06/25/2011   Procedure: NASAL SEPTOPLASTY WITH TURBINATE REDUCTION;  Surgeon: Osborn Coho, MD;  Location: [facility] OR;  Service: ENT;  Laterality: Bilateral;   PLACEMENT OF LUMBAR DRAIN N/A 07/18/2022   Procedure: PLACEMENT OF LUMBAR DRAIN;  Surgeon: Lisbeth Renshaw, MD;  Location: [facility] OR;  Service: Neurosurgery;  Laterality: N/A;   TONSILLECTOMY     TOTAL KNEE ARTHROPLASTY Right 06/01/2015   Procedure: RIGHT TOTAL KNEE ARTHROPLASTY;  Surgeon: Kathryne Hitch, MD;  Location: [facility] ORS;  Service: Orthopedics;  Laterality: Right;  Block+general   VENTRICULOPERITONEAL SHUNT Right 08/08/2022   Procedure: LAP ASSITED SHUNT INSERTION VENTRICULAR-PERITONEAL;  Surgeon: Lisbeth Renshaw, MD;  Location: [facility] OR;  Service: Neurosurgery;   Laterality: Right;   Past Medical History:  Diagnosis Date   Anxiety    Arthritis, rheumatoid (HCC)    dx 1988   CAD (coronary artery disease), native coronary artery    Mild LAD disease with calcification noted at cath 12//27/16    Cardiomyopathy (HCC)    Cardiomyopathy (HCC)    Chronic systolic heart failure (HCC) 05/01/2015   Depression    Deviated nasal septum 06/25/2011   Headache    High cholesterol    Hyperlipidemia    Impingement syndrome of left shoulder 12/22/2017   Macular degeneration    Nausea and vomiting 08/28/2022   Rheumatoid aortitis    Sleep apnea    does not use cpap - UNABLE TO TOLERATE MASK   Sleep apnea in adult    deviated septum repaired, most recent sleep study was negative   Status post total right knee replacement 06/01/2015   BP 116/86   Pulse (!) 104   Ht 6\' 3"  (1.905 m)   Wt 181 lb (82.1 kg)   SpO2 95%   BMI 22.62 kg/m   Opioid Risk Score:   Fall Risk Score:  `1  Depression screen [facility] 2/9     10/20/2022    8:51 AM  Depression screen PHQ 2/9  Decreased Interest 1  Down, Depressed, Hopeless 1  PHQ - 2 Score 2    Review of Systems  Musculoskeletal:  Positive for gait problem.  Neurological:  Positive for tremors.  Psychiatric/Behavioral:  Positive for confusion.        Anxiety, depression  All other systems reviewed and are negative.     Objective:   Physical Exam   PE: Constitution: Appropriate appearance for age. No apparent distress. Resp: No respiratory distress. No accessory muscle usage. on RA and CTAB Cardio: Well perfused appearance. No peripheral edema. Abdomen: Nondistended. Nontender.   Psych: Depressed mood, mildly flat affect.  Skin: Posterior neck surgical site no longer swollen. Incision c/d/I, mild erythema at top, no further swelling  Neurologic Exam:   DTRs: Reflexes were 2+ in bilateral patella, biceps. Babinsky: flexor responses b/l.   Hoffmans: negative b/l Sensory exam: revealed normal sensation  in all dermatomal regions in bilateral upper extremities and bilateral lower extremities Motor exam: strength 5/5 throughout bilateral upper extremities and bilateral lower extremities Coordination:  + BL LE tremors and ataxia > RUE > LUE.   + RAM milldy reduced R>L hand Gait: Drift to the left with gait, otherwise stable pattern with cane.        Assessment & Plan:  Christopher Yu is a 62 y.o. year old male  who  has a past medical history of Anxiety, Arthritis, rheumatoid (HCC), CAD (coronary artery disease), native coronary artery, Cardiomyopathy (HCC), Cardiomyopathy (HCC), Chronic systolic heart failure (HCC) (16/02/9603), Depression, Deviated nasal septum (06/25/2011), Headache, High cholesterol, Hyperlipidemia, Impingement syndrome of left shoulder (12/22/2017), Macular degeneration, Nausea and vomiting (08/28/2022), Rheumatoid aortitis, Sleep apnea, Sleep apnea in adult, and Status post total right knee replacement (06/01/2015).   They are presenting to PM&R clinic for follow up related to cerebellar AVM s/p resection 04/2022 -> c/b pseudomeningocele s/p repair 07/18/22-> hospitalization and IPR admission 3/25-4/23 for secondary cerebellar stroke and hydrocephalus s/p VP shunt 08/08/22 with Dr. Conchita Paris.   Cerebellar stroke (HCC) Status post craniotomy Continue with therapies for now, continue energy conservation and use of rolling walker with significant exertion; okay to continue quad cane for short distances, as gait appears stable with this.  Continuing following up with Dr. Conchita Paris.  Incisional site looks good today, and Benadryl p.o. is causing increased lethargy during the daytime, therefore switch to topical Benadryl over-the-counter to the incision site as needed for itching and stop oral Benadryl.   Follow-up in 3 months.  Rheumatoid arthritis of other site, unspecified whether rheumatoid factor present [facility])  Follow-up with your rheumatologist for infusions in  July; be aware that steroids can interfere with sleep quality and may be a factor in worsening insomnia  Refilled patient's tramadol 50 mg twice daily as needed, generally gets through Dr. Margaretann Loveless PCP, will message him to let him know of this in anticipation of next visit.  Patient is going out of town soon, therefore fill prescription today was appropriate.  Stop oxycodone.  Insomnia, unspecified type Anxiety and depression  Discussed risks and benefits of increasing versus adding medication, along with significant risk of serotonin syndrome with sertraline, tramadol, and trazodone.  Patient opted to increase trazodone today, sent prescription for 75 mg nightly as needed.  Will stop medication if any symptoms of serotonin syndrome and inform us immediately.  Did urged patient to get a repeat sleep study in the presence of history of OSA, and wife endorsing snoring at nighttime.  Defer follow-up to PCP.  Will defer increasing sertraline for anxiety/depression with increase trazodone as above.

## 2022-10-20 NOTE — Therapy (Signed)
OUTPATIENT SPEECH LANGUAGE PATHOLOGY TREATMENT NOTE   Patient Name: Christopher Yu MRN: [id number] DOB:May 19, 1960, 62 y.o., male Today's Date: 10/20/2022  PCP: Thana Ates, MD REFERRING PROVIDER: Charlton Amor, PA-C   END OF SESSION:   End of Session - 10/20/22 1100     Visit Number 10    Number of Visits 17    Date for SLP Re-Evaluation 10/24/22    Authorization Type [employer]    SLP Start Time 1100    SLP Stop Time  1145    SLP Time Calculation (min) 45 min                  Past Medical History:  Diagnosis Date   Anxiety    Arthritis, rheumatoid (HCC)    dx 1988   CAD (coronary artery disease), native coronary artery    Mild LAD disease with calcification noted at cath 12//27/16    Cardiomyopathy (HCC)    Cardiomyopathy (HCC)    Chronic systolic heart failure (HCC) 05/01/2015   Depression    Deviated nasal septum 06/25/2011   Headache    High cholesterol    Hyperlipidemia    Impingement syndrome of left shoulder 12/22/2017   Macular degeneration    Nausea and vomiting 08/28/2022   Rheumatoid aortitis    Sleep apnea    does not use cpap - UNABLE TO TOLERATE MASK   Sleep apnea in adult    deviated septum repaired, most recent sleep study was negative   Status post total right knee replacement 06/01/2015   Past Surgical History:  Procedure Laterality Date   ANKLE FUSION Right 05/05/2012   related to his arthritis   ANKLE FUSION Left 05/25/2013   related to his arthritis   ANKLE SURGERY Bilateral    APPLICATION OF CRANIAL NAVIGATION N/A 04/11/2022   Procedure: APPLICATION OF CRANIAL NAVIGATION;  Surgeon: Lisbeth Renshaw, MD;  Location: [facility];  Service: Neurosurgery;  Laterality: N/A;   APPLICATION OF CRANIAL NAVIGATION N/A 08/08/2022   Procedure: APPLICATION OF CRANIAL NAVIGATION;  Surgeon: Lisbeth Renshaw, MD;  Location: [facility];  Service: Neurosurgery;  Laterality: N/A;   CARDIAC CATHETERIZATION N/A 05/01/2015   Procedure: Left  Heart Cath and Coronary Angiography;  Surgeon: Lyn Records, MD;  Location: [facility] INVASIVE CV LAB;  Service: Cardiovascular;  Laterality: N/A;   CRANIOTOMY N/A 04/11/2022   Procedure: STEREOTACTIC SUBOCCIPITAL CRANIOTOMY FOR RESECTION OF ARTERIO-VENOUS MALFORMATION;  Surgeon: Lisbeth Renshaw, MD;  Location: [facility] OR;  Service: Neurosurgery;  Laterality: N/A;   FRACTURE SURGERY     left femur fracture x 3   IR ANGIO EXTERNAL CAROTID SEL EXT CAROTID BILAT MOD SED  01/28/2022   IR ANGIO INTRA EXTRACRAN SEL INTERNAL CAROTID BILAT MOD SED  01/28/2022   IR ANGIO INTRA EXTRACRAN SEL INTERNAL CAROTID BILAT MOD SED  04/16/2022   IR ANGIO VERTEBRAL SEL VERTEBRAL BILAT MOD SED  01/28/2022   IR ANGIO VERTEBRAL SEL VERTEBRAL BILAT MOD SED  04/16/2022   KNEE ARTHROSCOPY     right x 4   LAPAROSCOPIC REVISION VENTRICULAR-PERITONEAL (V-P) SHUNT N/A 08/08/2022   Procedure: LAPAROSCOPIC VENTRICULAR-PERITONEAL (V-P) SHUNT;  Surgeon: Harriette Bouillon, MD;  Location: [facility];  Service: General;  Laterality: N/A;   LUMBAR LAMINECTOMY/DECOMPRESSION MICRODISCECTOMY N/A 07/18/2022   Procedure: Repair of Pseudomeningiocele posterior;  Surgeon: Lisbeth Renshaw, MD;  Location: [facility];  Service: Neurosurgery;  Laterality: N/A;   NASAL SEPTOPLASTY W/ TURBINOPLASTY  06/25/2011   Procedure: NASAL SEPTOPLASTY WITH TURBINATE REDUCTION;  Surgeon: Osborn Coho,  MD;  Location: [facility];  Service: ENT;  Laterality: Bilateral;   PLACEMENT OF LUMBAR DRAIN N/A 07/18/2022   Procedure: PLACEMENT OF LUMBAR DRAIN;  Surgeon: Lisbeth Renshaw, MD;  Location: [facility];  Service: Neurosurgery;  Laterality: N/A;   TONSILLECTOMY     TOTAL KNEE ARTHROPLASTY Right 06/01/2015   Procedure: RIGHT TOTAL KNEE ARTHROPLASTY;  Surgeon: Kathryne Hitch, MD;  Location: [facility];  Service: Orthopedics;  Laterality: Right;  Block+general   VENTRICULOPERITONEAL SHUNT Right 08/08/2022   Procedure: LAP ASSITED SHUNT INSERTION VENTRICULAR-PERITONEAL;  Surgeon:  Lisbeth Renshaw, MD;  Location: [facility];  Service: Neurosurgery;  Laterality: Right;   Patient Active Problem List   Diagnosis Date Noted   Insomnia 10/20/2022   Anxiety and depression 10/20/2022   Nausea and vomiting 08/28/2022   Adjustment disorder 08/06/2022   Protein-calorie malnutrition, severe 07/31/2022   Cerebellar stroke (HCC) 07/29/2022   Ischemic cerebrovascular accident (CVA) (HCC) 07/28/2022   Prolonged QT interval 07/28/2022   Pseudomeningocele 07/18/2022   Pseudomeningocele due to surgical procedure 07/18/2022   Status post craniotomy 04/11/2022   AVM (arteriovenous malformation) brain 04/11/2022   Impingement syndrome of left shoulder 12/22/2017   Impingement syndrome of right shoulder 12/22/2017   Rheumatoid arthritis involving right knee (HCC) 06/01/2015   Status post total right knee replacement 06/01/2015   Cardiomyopathy (HCC) 05/02/2015   Hyperlipidemia    Sleep apnea in adult    Rheumatoid arthritis (HCC)    Chronic systolic heart failure (HCC) 05/01/2015   CAD (coronary artery disease), native coronary artery     ONSET DATE: 07/28/22  REFERRING DIAG:  I63.9 (ICD-10-CM) - Cerebral infarction, unspecified   THERAPY DIAG:  Dysarthria and anarthria  Rationale for Evaluation and Treatment: Rehabilitation  SUBJECTIVE:   SUBJECTIVE STATEMENT: "getting better."   PAIN:  Are you having pain? Yes NPRS scale: 8/10 Pain location: right shoulder Pain orientation: Right  PAIN TYPE: aching Pain description: constant  Aggravating factors: movement Relieving factors: rest   OBJECTIVE:   TODAY'S TREATMENT:                                                                                                                                         DATE:   10/20/22: Patient continues to have difficulty breathing demonstrating shortness of breath during the session. Led through [employer]. Patient required frequent mod-A to stay on topic and answer direct  questions as he demonstrated limited flexibility due to previous understanding of golf prices. Patient able to talk through sequence of what it takes to hit a golf ball. Patient was able to use visual aids to read information and answer comprehension math and time. SLP led through discussion on future visits with OT/PT/ST, and how to use his benefits to his advantage in the future.   10/06/22: Wife, [name] and daughter Chole present. [name] reports concern re: Marcelus running out of air when talking and before  starting to talk, audible breaths. Nathaiel endorses both. [name] continues to endorse coughing with PO "occasionally" Today, he required cues to completed left head turn with water sips, resulting in coughing 1/4 sips. Due to pt concerns re: breath support, measured Maximum Expiratory Pressure (MEP) and Maximum Inspiratory Pressure (MIP) using pressure manometer, nose plugs. Jack's MEP was 111 cm/H2O; WNL for his age is 54. MIP was 104, WNL 94. Targeted dysphagia, speech training [name] on EMST 150, set at 70 cm/H20, approx 70% of his MEP, goal to reach 80% of MEP. Hamdaan demonstrated 5 sets of 5 reps. [name] verbalized understanding - handout provided - see pt instructions. Next session, observe with EMST, educate this will need to be done for 8 weeks min  10/02/22: Targeted conversation topics to practice increasing conversation length and use of dysarthria strategies during conversational speech. Led through debate topics to work on mental flexibility with mod-A of models and cues. Patient demonstrated mental rigidity and required max cues to consider additional perspectives and complete x1 scenario. Spouse mentions that this has been consistent in the past, but that patient is more blunt since the stroke. Led through safe conversation topics to discuss during the party including, education, family. Role play to work on Mudlogger language for future party. HEP sent of ways to avoid un-preferred conversation topics.    09/30/22: Patient has a graduation party this weekend. He plans on avoiding people and not talking. Spouse, and SLP encouraged him to speak more and to plan topics beforehand. Led through answering questions and problem solving. Was able to speak in 30 minute conversation with min-A, and encouragement. Intelligibility at 90%, with occasional collapse of longer words, however, patient was able to clear throat and self correct. Voice sounded less groggy/wet when compared to last week. Patient spoke more with desired topics, ie golf, politics between [place]    09/25/22: Led through cognitive exercises to state similarities and differences between items with min-A. Patient carried over slow rate of speech from structured task to conversational speech through telling of a story of friends. Speech clear, with min-cuing. Pt states that speech sounds clear when asked to self monitor. Unable to do HEP sent home 5/20 due to graduation. States that graduation required lots of talking and that he feels like it went well. SLP noted voice quality to be wet and groggy, that did not diminish with throat clear and water, however this did not decrease intelligibility.    5/20/24[name] with limited completion of HEP at home as they had company from out of town. He completed HEP using slow rate, over articulation repeating multi-syllabic words and generating sentence with the word with occasional min verbal cues and modeling. Speech today more slurred than last session - suspect fatigue due to company this weekend. In structured task generating 3 sentence salient description of vegetables, Abdinasir carried over slow rate and over articulation requiring 2 verbal cues to ID unintelligible words (out of 15 sentences) and to repeat intelligibly. In repetition task (due to reduced vision) [name] stated 4 words and Id'd the item that did not belong and explained way using intelligible speech 10/10x. In conversation re:  grilling/cooking, he maintained intelligible speech in this quiet environment, however as noted before, speech with increased imprecision and slur today. Sequencing homework provided - family will need to read this to him  09/18/22: Pt reports ongoing daily completion of effortful swallow to address dysphagia. Subjective improvement of swallow function per pt report with reduced instances  of coughing with liquids and no longer using thickened liquids. In structured practice targeting tri-syllabic words at generative sentence level, pt with 100% accuracy. Practice naturally occurring in conversational speech results in ~70% accuracy of 3+ syllable words with increased challenges endorsed by pt. Barrier to communication at home is pt resistant to repeating himself. Education provided regarding increased complexity of using strategies in conversation to encourage pt to give himself grace when breakdowns occur. Add oral reading to HEP, if able d/t vision concerns related to dry eyes.      PATIENT EDUCATION: Education details: POC, goals, evaluation results/recommendations Person educated: Patient and Spouse Education method: Explanation, Demonstration, Verbal cues, and Handouts Education comprehension: verbalized understanding, returned demonstration, and needs further education     GOALS: Goals reviewed with patient? Yes   SHORT TERM GOALS: Target date: 10/03/2022   Pt will teach back dysarthria strategies and compensations with mod-I Baseline: Goal status: MET   2.   Pt will employ dysarthria strategies during structured practice activities resulting in intelligible production at mult-sentence level in 80% of trials Baseline:  Goal status: MET   3. Pt will demonstrate use of swallow compensations/strategies with PO trials with mod-I over 2 sessions  Baseline:  Goal status: PARTIALLY MET   4.  Pt will complete HEP 6/7 days over 1 week period with mod-I Baseline:  Goal status: MET      LONG TERM GOALS: Target date: 10/24/2022   Pt will complete repeat MBSS Baseline:  Goal status: ONGOING   2.  Pt will carryover dysarthria strategies/compensations during 20 minute conversation resulting in 90% ineligibility  Baseline:  Goal status: ONGOING   3.  Pt and spouse will report subjective report of reduced need for requests for repetition by 50% at home, over 1 week period Baseline:  Goal status:ONGOING   4.  Pt will report improvement via PROM by dc  Baseline:  Goal status: ONGOING     ASSESSMENT:   CLINICAL IMPRESSION: Patient is a 62 y.o. M who was seen today for dysphagia and motor speech eval s/p cerebellar stroke. Evaluation reveals moderate dysarthria and recent MBSS demonstrates mild pharyngeal dysphagia. Pt's speech is c/b irregular imprecise articulation and distortions, volume decay on expanded utterances, and reduced use of prosody. Pt reports frustration regarding his communicative abilities, tells SLP his vocation has high level of demands for communication and he feels he will "never get back there, it's impossible." As evaluation progressed, pt with increased frustration with SLP providing education to encourage participation and positive mindset for engagement in therapy course. Brief introduction to dysarthria strategies initiated. Reviewed instrumental swallow study results and updated pt's dysphagia HEP to accommodate preferences while targeting demonstrated deficits. Will plan to request repeat MBSS following usual completion of dysphagia HEP over 1-2 month period. Pt would benefit from skilled ST to address aforementioned deficits to improve QoL, enhance communication efficacy, and facilitate improved swallow function/safety .      OBJECTIVE IMPAIRMENTS: include dysarthria and dysphagia. These impairments are limiting patient from return to work, effectively communicating at home and in community, and safety when swallowing. Factors affecting potential to  achieve goals and functional outcome are cooperation/participation level. Patient will benefit from skilled SLP services to address above impairments and improve overall function.   REHAB POTENTIAL: Good   PLAN:   SLP FREQUENCY: 2x/week   SLP DURATION: 8 weeks   PLANNED INTERVENTIONS: Aspiration precaution training, Pharyngeal strengthening exercises, Diet toleration management , Trials of upgraded texture/liquids, Cueing hierachy, Internal/external aids, Functional  tasks, Multimodal communication approach, SLP instruction and feedback, Compensatory strategies, Patient/family education, and Re-evaluation     [place], Student-SLP 10/20/2022, 11:01 AM

## 2022-10-20 NOTE — Therapy (Signed)
OUTPATIENT OCCUPATIONAL THERAPY NEURO TREATMENT & DISCHARGE  Patient Name: Christopher Yu MRN: [id number] DOB:26-Jun-1960, 62 y.o., male Today's Date: 10/20/2022  PCP: Thana Ates, MD REFERRING PROVIDER: Charlton Amor, PA-C  END OF SESSION:  OT End of Session - 10/20/22 1014     Visit Number 11    Number of Visits 20    Date for OT Re-Evaluation 06/20/22    Authorization Type [employer]    OT Start Time 1015    OT Stop Time 1100    OT Time Calculation (min) 45 min    Equipment Utilized During Treatment Single-point cane    Activity Tolerance Patient tolerated treatment well    Behavior During Therapy WFL for tasks assessed/performed;Restless             Past Medical History:  Diagnosis Date   Anxiety    Arthritis, rheumatoid (HCC)    dx 1988   CAD (coronary artery disease), native coronary artery    Mild LAD disease with calcification noted at cath 12//27/16    Cardiomyopathy (HCC)    Cardiomyopathy (HCC)    Chronic systolic heart failure (HCC) 05/01/2015   Depression    Deviated nasal septum 06/25/2011   Headache    High cholesterol    Hyperlipidemia    Impingement syndrome of left shoulder 12/22/2017   Macular degeneration    Nausea and vomiting 08/28/2022   Rheumatoid aortitis    Sleep apnea    does not use cpap - UNABLE TO TOLERATE MASK   Sleep apnea in adult    deviated septum repaired, most recent sleep study was negative   Status post total right knee replacement 06/01/2015   Past Surgical History:  Procedure Laterality Date   ANKLE FUSION Right 05/05/2012   related to his arthritis   ANKLE FUSION Left 05/25/2013   related to his arthritis   ANKLE SURGERY Bilateral    APPLICATION OF CRANIAL NAVIGATION N/A 04/11/2022   Procedure: APPLICATION OF CRANIAL NAVIGATION;  Surgeon: Lisbeth Renshaw, MD;  Location: [facility] OR;  Service: Neurosurgery;  Laterality: N/A;   APPLICATION OF CRANIAL NAVIGATION N/A 08/08/2022   Procedure: APPLICATION OF  CRANIAL NAVIGATION;  Surgeon: Lisbeth Renshaw, MD;  Location: [facility] OR;  Service: Neurosurgery;  Laterality: N/A;   CARDIAC CATHETERIZATION N/A 05/01/2015   Procedure: Left Heart Cath and Coronary Angiography;  Surgeon: Lyn Records, MD;  Location: [facility] INVASIVE CV LAB;  Service: Cardiovascular;  Laterality: N/A;   CRANIOTOMY N/A 04/11/2022   Procedure: STEREOTACTIC SUBOCCIPITAL CRANIOTOMY FOR RESECTION OF ARTERIO-VENOUS MALFORMATION;  Surgeon: Lisbeth Renshaw, MD;  Location: [facility] OR;  Service: Neurosurgery;  Laterality: N/A;   FRACTURE SURGERY     left femur fracture x 3   IR ANGIO EXTERNAL CAROTID SEL EXT CAROTID BILAT MOD SED  01/28/2022   IR ANGIO INTRA EXTRACRAN SEL INTERNAL CAROTID BILAT MOD SED  01/28/2022   IR ANGIO INTRA EXTRACRAN SEL INTERNAL CAROTID BILAT MOD SED  04/16/2022   IR ANGIO VERTEBRAL SEL VERTEBRAL BILAT MOD SED  01/28/2022   IR ANGIO VERTEBRAL SEL VERTEBRAL BILAT MOD SED  04/16/2022   KNEE ARTHROSCOPY     right x 4   LAPAROSCOPIC REVISION VENTRICULAR-PERITONEAL (V-P) SHUNT N/A 08/08/2022   Procedure: LAPAROSCOPIC VENTRICULAR-PERITONEAL (V-P) SHUNT;  Surgeon: Harriette Bouillon, MD;  Location: [facility] OR;  Service: General;  Laterality: N/A;   LUMBAR LAMINECTOMY/DECOMPRESSION MICRODISCECTOMY N/A 07/18/2022   Procedure: Repair of Pseudomeningiocele posterior;  Surgeon: Lisbeth Renshaw, MD;  Location: [facility] OR;  Service: Neurosurgery;  Laterality:  N/A;   NASAL SEPTOPLASTY W/ TURBINOPLASTY  06/25/2011   Procedure: NASAL SEPTOPLASTY WITH TURBINATE REDUCTION;  Surgeon: Osborn Coho, MD;  Location: [facility] OR;  Service: ENT;  Laterality: Bilateral;   PLACEMENT OF LUMBAR DRAIN N/A 07/18/2022   Procedure: PLACEMENT OF LUMBAR DRAIN;  Surgeon: Lisbeth Renshaw, MD;  Location: [facility] OR;  Service: Neurosurgery;  Laterality: N/A;   TONSILLECTOMY     TOTAL KNEE ARTHROPLASTY Right 06/01/2015   Procedure: RIGHT TOTAL KNEE ARTHROPLASTY;  Surgeon: Kathryne Hitch, MD;  Location: [facility] ORS;  Service:  Orthopedics;  Laterality: Right;  Block+general   VENTRICULOPERITONEAL SHUNT Right 08/08/2022   Procedure: LAP ASSITED SHUNT INSERTION VENTRICULAR-PERITONEAL;  Surgeon: Lisbeth Renshaw, MD;  Location: [facility] OR;  Service: Neurosurgery;  Laterality: Right;   Patient Active Problem List   Diagnosis Date Noted   Insomnia 10/20/2022   Anxiety and depression 10/20/2022   Nausea and vomiting 08/28/2022   Adjustment disorder 08/06/2022   Protein-calorie malnutrition, severe 07/31/2022   Cerebellar stroke (HCC) 07/29/2022   Ischemic cerebrovascular accident (CVA) (HCC) 07/28/2022   Prolonged QT interval 07/28/2022   Pseudomeningocele 07/18/2022   Pseudomeningocele due to surgical procedure 07/18/2022   Status post craniotomy 04/11/2022   AVM (arteriovenous malformation) brain 04/11/2022   Impingement syndrome of left shoulder 12/22/2017   Impingement syndrome of right shoulder 12/22/2017   Rheumatoid arthritis involving right knee (HCC) 06/01/2015   Status post total right knee replacement 06/01/2015   Cardiomyopathy (HCC) 05/02/2015   Hyperlipidemia    Sleep apnea in adult    Rheumatoid arthritis (HCC)    Chronic systolic heart failure (HCC) 05/01/2015   CAD (coronary artery disease), native coronary artery     ONSET DATE: 08/21/2022 (date of referral)  REFERRING DIAG: I63.9 (ICD-10-CM) - Cerebral infarction, unspecified  THERAPY DIAG:  Unsteadiness on feet  Other lack of coordination  Muscle weakness (generalized)  Rationale for Evaluation and Treatment: Rehabilitation  SUBJECTIVE:   SUBJECTIVE STATEMENT:  Pt reports when he wasn't not feeling well last week, it was his RA acting up and he did a taper of Prednisone which finished yesterday. Wife reports he may need his infusion for arthritis but it won't be able to be scheduled until next month at the earliest. Most issues with RA is with his L shoulder/arm.   Ophthalmology appt on Friday - added an allergy drop in AM and  continuing with regular rewetting drops in the day and then nighttime he uses a gel drop.  They report his vision is about back to where is was before - no change in prescription and back to "normal."  He also started Benadryl (adding topical) for head itchiness around his surgery site.  Patient and family are leaving Thursday for [place] for his daughter's wedding on Saturday.  They are staying 1 week and driving there with their other 2 daughters.  They plan to stop every 3 hours to rest/stretch etc and will make the 12 hour trip in 2 days.  Patient is awaiting an appt with Dr. Arlana Pouch for a motion neurology appt due to tremors in L arm.  At end of session, they discussed this being his last visit, which this therapist was unaware of but DC added to this visit at this time.  Pt accompanied by: self and significant other, [name].   PERTINENT HISTORY: CT/MRI showed new small focus of acute infarction in the right superior cerebellar hemisphere. Cerebellar AVM resection 12/23, pseudomeningocele s/p craniotomy 3/24, acute R cerebellar infarct, hydrocephalus and VP shunt was  placed on 08/08/22, dysphagia, dysarthria, aphasia. PMH: rheumatoid arthritis, bilateral ankle fusion (2014 and 2015), cardiomyopathy, CAD, R TKA, cardiomyopathy, chronic systolic heart failure, macular degeneration, OSA on CPAP  PRECAUTIONS: Fall and Other: swallow precautions with modification with left head tilt and supervision if drinking thinned liquids, per caregiver not to lift, history of orthostatic hypotension resulting in falls; no driving  WEIGHT BEARING RESTRICTIONS: No  PAIN:  Are you having pain? No, denies headaches  FALLS: Has patient fallen in last 6 months? Yes. Number of falls 3 - after the first surgery, got dizzy and fell, using the walker, when up and moving, struggled with orthostatic hypotension but medical team has since decreased BP medications  LIVING ENVIRONMENT: Lives with: lives with their spouse, two  teenage daughters (also has dtr who lives in [place]), and 175 lb dog Lives in: House/apartment - Able to live on main floor Stairs: Yes: Internal: ~14 steps; on right going up and External: 3 steps; on right going up Has following equipment at home: Dan Humphreys - 2 wheeled and Wheelchair (manual) Bathroom setup: [employer], regular height toilet  PLOF: Prior to December, pt completely independent with ADL/IADLs, ambulatory without device, pt enjoys golfing, driving, 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  PATIENT GOALS: Return to golf, improve vision for work, travel in June for daughter's outdoor wedding (be off the walker ideally)  OBJECTIVE: (results from evaluation unless otherwise stated)  HAND DOMINANCE: Right  ADLs: Eating: Independent with self-feeding, wife cuts items due to impaired coordination per her report Grooming: Independent mostly, but reports it's painful to brush hair from surgical procedure in December UB Dressing: Supervision, increased time and wife present if needed LB Dressing: Supervision increased time and wife present if needed. Sometimes sits to don pants Toileting: Supervision, more effort, wife there in case pt loses balance Bathing: Supervision for balance Tub Shower transfers: Supervision likely; however, sponge bath currently due to hand held shower head breaking and plumber coming 5/1.  Equipment:  Toilet riser with armrests , shower chair, bedrail on bed  IADLs: Shopping: Currently unable, previously independent Light housekeeping: Currently unable, previously did laundry Meal Prep: Currently unable, but previously assisted in task Community mobility: and household, pt utilizing rolling walker at all times. Typically sedentary in recliner most of the day. Gets up for bathroom.  Medication management: Wife Presenter, broadcasting: Currently unable, pt typically did before Handwriting: 75% legible and Mild micrographia   MOBILITY STATUS:  Pt  ambulatory with RW, wife providing supervision  at all times  POSTURE COMMENTS:  rounded shoulders Sitting balance: Moves/returns truncal midpoint 1-2 inches in multiple planes - Wife describes she believes it is more of a coordination issue, overshooting and then losing balance.   ACTIVITY TOLERANCE: Activity tolerance: Subjective report of significant fatigue  FUNCTIONAL OUTCOME MEASURES: FOTO: 58 score  UPPER EXTREMITY ROM:    Active ROM Right eval Left eval  Shoulder flexion [facility]  Shoulder abduction [facility]  Shoulder adduction [facility]  Shoulder extension [facility]  Shoulder internal rotation    Shoulder external rotation    Elbow flexion WFL WFL  Elbow extension [facility] WFL  Wrist flexion    Wrist extension    Wrist ulnar deviation    Wrist radial deviation    Wrist pronation    Wrist supination    (Blank rows = not tested)  UPPER EXTREMITY MMT:     MMT Right eval Left eval  Shoulder flexion 4+/5 4+/5  Shoulder abduction  Shoulder adduction    Shoulder extension    Shoulder internal rotation    Shoulder external rotation    Middle trapezius    Lower trapezius    Elbow flexion 4+/5 4+/5  Elbow extension 4+/5 4+/5  Wrist flexion    Wrist extension    Wrist ulnar deviation    Wrist radial deviation    Wrist pronation    Wrist supination    (Blank rows = not tested)  HAND FUNCTION: Grip strength: Right: 67.2 lbs; Left: 66.3 lbs and 3 point pinch: Right: 12 lbs, Left: 15 lbs  COORDINATION: 9 Hole Peg test: Right: 31 sec; Left: 59 sec Box and Blocks:  Right 27 blocks, Left 19blocks  SENSATION: WFL  EDEMA: None  MUSCLE TONE: WFL  COGNITION: Overall cognitive status:  Grossly WFL, possible for attention deficits.  VISION: Subjective report: Pt reports some jerky vision since December Baseline vision: Wears glasses all the time Visual history: cataracts and macular degeneration  VISION ASSESSMENT: Impaired Eye alignment: Impaired: Right eye  seemed to have amblyopia-type appearance with visual pursuits  Reading acuity: Pt reports difficulty reading instructions at this time, he reports all letters merge together Tracking/Visual pursuits: Right eye appears to be slower than left Saccades: overshoots - right eye from temporal to nasal Convergence: WFL Visual Fields: Grossly intact; however, decreased peripheral vision to right side when provided with moving target with detection of object at 30* Diplopia assessment: When given macular degeneration Amsler grid, pt reports double vision of right eye when left eye occluded. Quadrants - WFL   Patient has difficulty with following activities due to following visual impairments: Work related tasks  PERCEPTION: WFL  PRAXIS: WFL  OBSERVATIONS: Patient arrives to session ambulating with rolling walker. Patient wife primarily speaks during the session given patient's dysarthria. Per caregiver, patient had a surgery to address AVM and then had pseudomengiocele and underwent 3 surgeries as a result of complications. Per report, patient was D/C from hospital and then returned with symptoms of acute stroke. Prior to December patient was fully independent, working full time in IT, and golfing regularly every weekend; however, patient is now fully supervised for all ADLs, not working, and now mobilizing either via manual W/C or walker. Patient reports increased nausea disrupting appetite. Family reports going to the ED on last Wednesday due to new reports of nausea and double vision and there was concern for another stroke; however, family decided to the return home when CT came back clear and did not want to go through additional MRI. Per family, inpatient rehab physician aware of this and recommended they call their neurosurgeon for follow up as well and did not advise against continuing outpatient therapy. Family also expresses concern in not knowing how to truly recognize a stroke as patient now has  such extensive deficits. Family also reports that a therapist in inpatient rehab noticed unique eye movements and recommend they also see a therapist that could help address vestibular concerns. Wife showing OT video today. Video showed pt performing visual pursuits with jerky eye movement going from left to right sides, right eye impacted.  09/09/22: Pt received from PT lying on mat table. Pt stating taking a rest break. Pt denies pain or dizziness.  09/30/22: Pt ambulatory with SPC and well-kept. Pt wearing glasses. 10/06/22: Similar appearance   TODAY'S TREATMENT:  DATE: 10/20/22  Self-care:  Pt engaging in functional standing activities to improve IADLS for laundry and managing kitchen activities.  Pt ambulating 100' utilizing SPC with supervision for and stood 5-7 minutes for laundry tasks including lifting and carrying laundry basket, hanging clothes and placing them on tall shelves.  Also worked on Environmental manager with pot and water to simulate mobility in the kitchen.     Patient was engaged in leaning forward with hand support on counter to get pot from the cabinet and then filling it with water with good success.   He was able to use R hand to pick up pot handle after filling it with water and move it to the stove.  Also able to demonstrated tipping the pot to empty water into a bowl and move it to the table without difficulty or need to use cane for mobility.    Patient again mentioning wanting to drive with education provided re: need to consult MD and receive proper clearance with decreased acceptance of need for help despite education re: possible 'accident' (which is not planned) but could result in injury to self (which he was fine with) but denied possible injury to others.   PATIENT EDUCATION: Education details: seeking MD input re: driving activities   Education provided to: Patient, Delivered by: Verbal cues, Pt not as receptive today   HOME EXERCISE PROGRAM: 09/02/22: Coordination HEP 09/04/22: Modified coordination HEP (for vision) and vision HEP 09/18/22: No hand out provided but encouraged to work on standing activities throughout the day and combine with table top ball rolling for shoulder ROM. 10/06/22: Driver's rehab  GOALS: Goals reviewed with patient? Yes  SHORT TERM GOALS: Target date: 09/30/2022   Pt will be independent in LUE HEP. Baseline: Initiated 09/25/22: Completing independently Goal status: MET  2.  Pt will demonstrate improved fine motor coordination for ADLs as evidenced by decreasing 9 hole peg test score for left hand by >/=5 secs  Baseline: Lt - 59 secs 09/25/22: Lt hand - 43 secs Goal status: MET  3.  Pt will be able to place at least 3 more blocks using left hand with completion of Box and Blocks test.  Baseline: Lt- 19 blocks 09/25/22: Lt - 31 blocks Goal status: MET  4.  Pt will tolerate >/=10 minutes of standing activity to simulate activity tolerance needed for ADL/IADL management. Baseline: Subjective description of sitting down for most tasks 10/06/22: 10:04 minutes of activity Goal status: MET  5.  Pt will write a sentence with no significant decrease in size and maintain 100% legibility.  Baseline: 75% legibility and mild micrographia 09/30/22: 100% legible and normal size Goal status: MET  6.  Pt will improve visual closure skills to recognize and complete visual patterns or missing parts of visual stimuli, enhancing visual perception and problem-solving abilities. Baseline: Overshooting eye movements, impaired peripheral vision, merging of letters per subjective report 09/2822: Visual closure worksheet 3/6 correct Goal status: NOT MET  LONG TERM GOALS: Target date: 11/11/2022   Pt will complete FOTO assessment at time of discharge scoring 68 or greater indicating functional progression with  ADL and IADL completion. Baseline: 58  09/30/22: 61 Goal status: IN PROGRESS  2.  Pt will demonstrate improved fine motor coordination for ADLs as evidenced by decreasing 9 hole peg test score for left hand by >/=10 secs  Baseline: Lt - 59 secs 09/25/22: Lt - 43 secs Goal status: MET  3.  Pt will be able to place at least 15  more blocks using left hand with completion of Box and Blocks test.  Baseline: Lt - 19 blocks 09/25/22: Lt- 31 blocks Goal status: MET  4.  Pt will improve dynamic standing balance to Good (stand independently unsupported, able to weight shift and cross midline moderately) as needed for managing ADL/IADL tasks. Baseline: Subjective description of Fair (stand independently unsupported, weight shift, and reach ipsilaterally, LOB when crossing midline) with rolling walker 09/30/22: Good Goal status: MET  5.  Pt will verbalize at least two visual compensatory methods to include adaptive equipment as needed independently. Baseline:  09/30/22: Education provided Goal status: MET   ASSESSMENT:  CLINICAL IMPRESSION: Pt demonstrated overall progress with STGs and LTGs meeting 80% of goals although he remains limited by impaired fine motor control/coordination, and visual skills.   Pt has been given various HEPs and compensatory strategies through this OT POC but does not report consistent carryover - especially with RA flairs when he does not use his L am at all. Pt encouraged to incorporate LUE as much as possible in daily activity.   Pt still concerned about driving. Education provided for driver's rehab and handout provided previously. Pt frustrated by need to seek MD input re: resuming driving today. Education attempted today re: need to ensure safety for himself and those around him.  PERFORMANCE DEFICITS: in functional skills including ADLs, IADLs, coordination, dexterity, pain, Fine motor control, Gross motor control, mobility, balance, body mechanics, endurance,  vision, and UE functional use, cognitive skills including attention, energy/drive, and memory, and psychosocial skills including coping strategies, environmental adaptation, habits, and routines and behaviors.   IMPAIRMENTS: are limiting patient from ADLs, IADLs, work, and leisure.   CO-MORBIDITIES: may have co-morbidities  that affects occupational performance. Patient will benefit from skilled OT to address above impairments and improve overall function.  REHAB POTENTIAL: Good  PLAN:  OT FREQUENCY: 2x/week  OT DURATION: 10 weeks (anticipate 8 weeks, but pt going out of town for the summer months)  PLANNED INTERVENTIONS: self care/ADL training, therapeutic exercise, therapeutic activity, neuromuscular re-education, balance training, functional mobility training, electrical stimulation, fluidotherapy, moist heat, cryotherapy, patient/family education, cognitive remediation/compensation, visual/perceptual remediation/compensation, psychosocial skills training, energy conservation, coping strategies training, DME and/or AE instructions, and Re-evaluation  RECOMMENDED OTHER SERVICES: Follow-up with neuro ophthalmologist completed. Pt reports having an "eye doctor" that he goes to annually.  CONSULTED AND AGREED WITH PLAN OF CARE: Patient and family member/caregiver  PLAN:  OCCUPATIONAL THERAPY DISCHARGE SUMMARY  Visits from Start of Care: 11  Current functional level related to goals / functional outcomes: Pt has met 5/6 STGs and 4/5 LTGs as some were not reassessed before DC today but patient feels he has achieved a satisfactory level for his 'retirement' and is satisfied with current level/outcomes.  Remaining deficits: Pt has continued functional deficits in activity tolerance for prolonged standing activities, self limits participation in functional activities per PLOF.  Education / Equipment: Pt has all needed materials and education. Pt and spouse understands how to continue on with  self-management. See tx notes for more details.   Patient requests to be discharged due to satisfaction with current abilities and plans to be out of town upcoming.  Sessions have been saved to allow further outpatient occupational therapy later this year.    Victorino Sparrow, OT 10/20/2022, 2:42 PM

## 2022-10-22 ENCOUNTER — Encounter: Payer: Self-pay | Admitting: Physical Therapy

## 2022-10-22 ENCOUNTER — Ambulatory Visit: Payer: BC Managed Care – PPO | Admitting: Physical Therapy

## 2022-10-22 ENCOUNTER — Encounter: Payer: BC Managed Care – PPO | Admitting: Occupational Therapy

## 2022-10-22 ENCOUNTER — Ambulatory Visit: Payer: BC Managed Care – PPO | Admitting: Speech Pathology

## 2022-10-22 VITALS — BP 112/83 | HR 67

## 2022-10-22 DIAGNOSIS — R42 Dizziness and giddiness: Secondary | ICD-10-CM

## 2022-10-22 DIAGNOSIS — R269 Unspecified abnormalities of gait and mobility: Secondary | ICD-10-CM

## 2022-10-22 DIAGNOSIS — R278 Other lack of coordination: Secondary | ICD-10-CM

## 2022-10-22 DIAGNOSIS — R41840 Attention and concentration deficit: Secondary | ICD-10-CM | POA: Diagnosis not present

## 2022-10-22 DIAGNOSIS — R41842 Visuospatial deficit: Secondary | ICD-10-CM | POA: Diagnosis not present

## 2022-10-22 DIAGNOSIS — R1310 Dysphagia, unspecified: Secondary | ICD-10-CM | POA: Diagnosis not present

## 2022-10-22 DIAGNOSIS — M6281 Muscle weakness (generalized): Secondary | ICD-10-CM | POA: Diagnosis not present

## 2022-10-22 DIAGNOSIS — R2681 Unsteadiness on feet: Secondary | ICD-10-CM

## 2022-10-22 DIAGNOSIS — R471 Dysarthria and anarthria: Secondary | ICD-10-CM

## 2022-10-22 NOTE — Therapy (Signed)
OUTPATIENT PHYSICAL THERAPY NEURO TREATMENT-DISCHARGE SUMMARY   Patient Name: Ranulfo Kall MRN: [id number] DOB:30-May-1960, 62 y.o., male Today's Date: 10/22/2022  PCP: Thana Ates, MD REFERRING PROVIDER: Charlton Amor, PA-C  PHYSICAL THERAPY DISCHARGE SUMMARY  Visits from Start of Care: 11  Current functional level related to goals / functional outcomes: See clinical impression statement.   Remaining deficits: Safest mobility over unlevel or long distance using SPC w/ intermittent need for level ground due to fluctuation in gait mechanics.  Wide BOS and decreased trunk rotation and arm swing during gait.   Education / Equipment: Returning w/ new referral in future.   Patient agrees to discharge. Patient goals were partially met. Patient is being discharged due to the patient's request.   END OF SESSION:  PT End of Session - 10/22/22 1111     Visit Number 11    Number of Visits 17    Date for PT Re-Evaluation 11/07/22    Authorization Type BCBS    PT Start Time 1103    PT Stop Time 1147    PT Time Calculation (min) 44 min    Equipment Utilized During Treatment Gait belt    Activity Tolerance Patient tolerated treatment well    Behavior During Therapy WFL for tasks assessed/performed             Past Medical History:  Diagnosis Date   Anxiety    Arthritis, rheumatoid (HCC)    dx 1988   CAD (coronary artery disease), native coronary artery    Mild LAD disease with calcification noted at cath 12//27/16    Cardiomyopathy (HCC)    Cardiomyopathy (HCC)    Chronic systolic heart failure (HCC) 05/01/2015   Depression    Deviated nasal septum 06/25/2011   Headache    High cholesterol    Hyperlipidemia    Impingement syndrome of left shoulder 12/22/2017   Macular degeneration    Nausea and vomiting 08/28/2022   Rheumatoid aortitis    Sleep apnea    does not use cpap - UNABLE TO TOLERATE MASK   Sleep apnea in adult    deviated septum  repaired, most recent sleep study was negative   Status post total right knee replacement 06/01/2015   Past Surgical History:  Procedure Laterality Date   ANKLE FUSION Right 05/05/2012   related to his arthritis   ANKLE FUSION Left 05/25/2013   related to his arthritis   ANKLE SURGERY Bilateral    APPLICATION OF CRANIAL NAVIGATION N/A 04/11/2022   Procedure: APPLICATION OF CRANIAL NAVIGATION;  Surgeon: Lisbeth Renshaw, MD;  Location: [facility] OR;  Service: Neurosurgery;  Laterality: N/A;   APPLICATION OF CRANIAL NAVIGATION N/A 08/08/2022   Procedure: APPLICATION OF CRANIAL NAVIGATION;  Surgeon: Lisbeth Renshaw, MD;  Location: [facility] OR;  Service: Neurosurgery;  Laterality: N/A;   CARDIAC CATHETERIZATION N/A 05/01/2015   Procedure: Left Heart Cath and Coronary Angiography;  Surgeon: Lyn Records, MD;  Location: [facility] INVASIVE CV LAB;  Service: Cardiovascular;  Laterality: N/A;   CRANIOTOMY N/A 04/11/2022   Procedure: STEREOTACTIC SUBOCCIPITAL CRANIOTOMY FOR RESECTION OF ARTERIO-VENOUS MALFORMATION;  Surgeon: Lisbeth Renshaw, MD;  Location: [facility] OR;  Service: Neurosurgery;  Laterality: N/A;   FRACTURE SURGERY     left femur fracture x 3   IR ANGIO EXTERNAL CAROTID SEL EXT CAROTID BILAT MOD SED  01/28/2022   IR ANGIO INTRA EXTRACRAN SEL INTERNAL CAROTID BILAT MOD SED  01/28/2022   IR ANGIO INTRA EXTRACRAN SEL INTERNAL CAROTID BILAT MOD SED  04/16/2022   IR ANGIO VERTEBRAL SEL VERTEBRAL BILAT MOD SED  01/28/2022   IR ANGIO VERTEBRAL SEL VERTEBRAL BILAT MOD SED  04/16/2022   KNEE ARTHROSCOPY     right x 4   LAPAROSCOPIC REVISION VENTRICULAR-PERITONEAL (V-P) SHUNT N/A 08/08/2022   Procedure: LAPAROSCOPIC VENTRICULAR-PERITONEAL (V-P) SHUNT;  Surgeon: Harriette Bouillon, MD;  Location: [facility] OR;  Service: General;  Laterality: N/A;   LUMBAR LAMINECTOMY/DECOMPRESSION MICRODISCECTOMY N/A 07/18/2022   Procedure: Repair of Pseudomeningiocele posterior;  Surgeon: Lisbeth Renshaw, MD;  Location: [facility] OR;  Service:  Neurosurgery;  Laterality: N/A;   NASAL SEPTOPLASTY W/ TURBINOPLASTY  06/25/2011   Procedure: NASAL SEPTOPLASTY WITH TURBINATE REDUCTION;  Surgeon: Osborn Coho, MD;  Location: [facility] OR;  Service: ENT;  Laterality: Bilateral;   PLACEMENT OF LUMBAR DRAIN N/A 07/18/2022   Procedure: PLACEMENT OF LUMBAR DRAIN;  Surgeon: Lisbeth Renshaw, MD;  Location: [facility] OR;  Service: Neurosurgery;  Laterality: N/A;   TONSILLECTOMY     TOTAL KNEE ARTHROPLASTY Right 06/01/2015   Procedure: RIGHT TOTAL KNEE ARTHROPLASTY;  Surgeon: Kathryne Hitch, MD;  Location: [facility] ORS;  Service: Orthopedics;  Laterality: Right;  Block+general   VENTRICULOPERITONEAL SHUNT Right 08/08/2022   Procedure: LAP ASSITED SHUNT INSERTION VENTRICULAR-PERITONEAL;  Surgeon: Lisbeth Renshaw, MD;  Location: [facility] OR;  Service: Neurosurgery;  Laterality: Right;   Patient Active Problem List   Diagnosis Date Noted   Insomnia 10/20/2022   Anxiety and depression 10/20/2022   Nausea and vomiting 08/28/2022   Adjustment disorder 08/06/2022   Protein-calorie malnutrition, severe 07/31/2022   Cerebellar stroke (HCC) 07/29/2022   Ischemic cerebrovascular accident (CVA) (HCC) 07/28/2022   Prolonged QT interval 07/28/2022   Pseudomeningocele 07/18/2022   Pseudomeningocele due to surgical procedure 07/18/2022   Status post craniotomy 04/11/2022   AVM (arteriovenous malformation) brain 04/11/2022   Impingement syndrome of left shoulder 12/22/2017   Impingement syndrome of right shoulder 12/22/2017   Rheumatoid arthritis involving right knee (HCC) 06/01/2015   Status post total right knee replacement 06/01/2015   Cardiomyopathy (HCC) 05/02/2015   Hyperlipidemia    Sleep apnea in adult    Rheumatoid arthritis (HCC)    Chronic systolic heart failure (HCC) 05/01/2015   CAD (coronary artery disease), native coronary artery     ONSET DATE: 08/21/2022 (referral date)  REFERRING DIAG: I63.9 (ICD-10-CM) - Cerebral infarction,  unspecified  THERAPY DIAG:  Unsteadiness on feet  Other lack of coordination  Muscle weakness (generalized)  Abnormality of gait and mobility  Dizziness and giddiness  Rationale for Evaluation and Treatment: Rehabilitation  SUBJECTIVE:  SUBJECTIVE STATEMENT: Patient presents to PT using SPC and ambulating alongside wife.  He states he is feeling better today.  When discussing more visits patient states he would like to bank some visits for all 3 disciplines later in the year and would like to discharge today.  Pt accompanied by: self and significant other - wife Kiante Ciavarella  PERTINENT HISTORY: cerebellar AVM resection 12/23, pseudomeningocele s/p craniotomy 3/24, acute R cerebellar infarct, hydrocephalus and VP shunt was placed on 08/08/22, dysphagia, dysarthria, aphasia  PMH: rheumatoid arthritis, bilateral ankle fusion (2014 and 2015), cardiomyopathy, CAD, R TKA, cardiomyopathy, chronic systolic heart failure, macular degeneration, OSA on CPAP  PAIN:  Are you having pain? Yes: NPRS scale: 3/10 Pain location: back of head Pain description: "pulling apart when I turn my head"-gestures to incision line Aggravating factors: turning head Relieving factors: unsure if Benadryl topical is helping  PRECAUTIONS: Fall and Other: swallow precautions with modification with left head tilt and supervision if drinking thinned liquids, per caregiver not to lift, history of orthostatic hypotension resulting in falls  PATIENT GOALS: "To get back to playing golf and moving like I used to."  OBJECTIVE:   DIAGNOSTIC FINDINGS:   CT on 08/27/2022: IMPRESSION: 1. No evidence of acute infarct or hemorrhage. 2. Stable right-sided ventriculostomy catheter, tip within the right lateral ventricle. Stable size of the  lateral ventricles without hydrocephalus. 3. Postsurgical changes from prior suboccipital craniectomy, with underlying encephalomalacia in the cerebellum. The pseudomeningocele seen on prior study has increased slightly in size since prior exam, of uncertain significance. 4. Decreased size of the right superior cerebellar intraparenchymal cyst, with decreased mass effect upon the fourth ventricle.  MR Brain on 07/28/2022: IMPRESSION: 1. New small focus of acute infarction in the right superior cerebellar hemisphere with surrounding area of increased T2 signal and cystic change, which is new since the prior MRI dated 07/05/2022 and suspicious for now chronic infarct. 2. Postoperative changes of prior left posterior fossa resection. Redemonstrated foci of gas within the resection cavity, fourth ventricle and extra-axial spaces, likely secondary to recent pseudomeningocele repair and/or lumbar drain placement/removal. 3. Decreased size of the fluid collection in the dorsal soft tissues overlying the suboccipital craniectomy.  TODAY'S TREATMENT:                                                                                                                              -Assessed vitals: Today's Vitals   10/22/22 1106  BP: 112/83  Pulse: 67  SpO2: 99%  -Verbally reviewed HEP.  Pt states he has not been doing these.  He does not need reprint. -Discussed applying topical to scar w/ Q-tip and using this back and forth rubbing to desensitize area.  Discussed scar mobilization techniques like pulling and cross-friction and parallel scar rubbing w/ edge of popsicle stick when applying topical to decrease friction so long as scar remains well healed. -BERG:  [facility] PT Assessment - 10/22/22 1122  Berg Balance Test   Sit to Stand Able to stand without using hands and stabilize independently    Standing Unsupported Able to stand safely 2 minutes    Sitting with Back Unsupported but Feet  Supported on Floor or Stool Able to sit safely and securely 2 minutes    Stand to Sit Sits safely with minimal use of hands    Transfers Able to transfer safely, minor use of hands    Standing Unsupported with Eyes Closed Able to stand 10 seconds safely    Standing Unsupported with Feet Together Able to place feet together independently and stand 1 minute safely    From Standing, Reach Forward with Outstretched Arm Can reach confidently >25 cm (10")    From Standing Position, Pick up Object from Floor Able to pick up shoe safely and easily    From Standing Position, Turn to Look Behind Over each Shoulder Looks behind one side only/other side shows less weight shift   less weight shift left   Turn 360 Degrees Able to turn 360 degrees safely one side only in 4 seconds or less   5 seconds to left   Standing Unsupported, Alternately Place Feet on Step/Stool Able to stand independently and safely and complete 8 steps in 20 seconds    Standing Unsupported, One Foot in Front Able to take small step independently and hold 30 seconds    Standing on One Leg Tries to lift leg/unable to hold 3 seconds but remains standing independently    Total Score 49    Berg comment: 49/56 = cusp of moderate fall risk; supports use of cane indoors            -TUG goal met 5/23, not re-tested 6/19. - w/ rubber quad tip SPC:  9.78 seconds = 1.02 m/sec OR 3.37 ft/sec -FOTO 58%  PATIENT EDUCATION: Education details:  Per discussion of re-cert vs D/C at onset of session with pt and wife, he would like to D/C so wife can work part of July and he can practice at home and travel to [place] and then have visits for later in the year.  Walking in home w/o AD on straight pathway with wife holding onto him - use visual targets for pathway improvement.  Don't stare at feet, but incorporate looking down to check foot and pathway clearance/obstacles and then looking up and out-practice in home then outside with supervision  until it does not disrupt gait.  Use cane for long/unlevel/unfamiliar surfaces.  Obtain new referral for return eval as needed in future.  Please trial HEP and contact PT (via business card for clinic) with any questions/concerns. Person educated: Patient and Spouse Education method: Medical illustrator Education comprehension: verbalized understanding and needs further education  HOME EXERCISE PROGRAM: You Can Walk For A Certain Length Of Time Each Day                          Walk 2 minutes 1 times per day.             Increase 2  minutes every 3 days              Work up to 30 minutes (1-2 times per day).               Example:                         Day  1-2           4-5 minutes     3 times per day                         Day 7-8           10-12 minutes 2-3 times per day                         Day 13-14       20-22 minutes 1-2 times per day  Oculomotor: Saccades    Holding two targets positioned side by side __6__ inches apart, move eyes quickly from target to target as head stays still.  Perform sitting. Repeat __10__ times per session. Do __3__ sessions per day.  Access Code: [email or web address] URL: [email or web address] Date: 09/12/2022 Prepared by: Camille Bal  Exercises - Pencil Pushups  - 1 x daily - 5 x weekly - 3 sets - 5 reps - Corner Balance Feet Together With Eyes Closed  - 1 x daily - 5 x weekly - 1 sets - 3-4 reps - 30-45 seconds hold - Standing Tandem Balance with Counter Support  - 1 x daily - 5 x weekly - 1 sets - 2-3 reps - 45 seconds hold - Tandem Walking with Counter Support  - 1 x daily - 5 x weekly - 3 sets - 10 reps - Backward Walking with Counter Support  - 1 x daily - 5 x weekly - 3 sets - 10 reps - Walking with Head Rotation  - 1 x daily - 5 x weekly - 3 sets - 10 reps  GOALS: Goals reviewed with patient? Yes  SHORT TERM GOALS: Target date: 09/26/2022  Patient will demonstrate 100% compliance with initial HEP to continue to  progress between physical therapy sessions.   Baseline: Pt is intermittently compliant (5/23) Goal status: IN PROGRESS  2.  Patient will improve their 5x Sit to Stand score to less than 18 seconds to demonstrate a decreased risk for falls and improved LE strength.   Baseline: 22.45" without UE support/unsteady WBOS; 19.27 seconds, pt is steady (5/23) Goal status: IN PROGRESS  3.  Patient will improve TUG score to 17 seconds or less with LRAD to indicate a decreased risk of falls and demonstrate improved overall mobility.  Baseline: 21.27" with RW; 12.97 seconds w/ SPC (5/23) Goal status: MET  4.  Patient will improve gait speed to 0.87 m/s to indicate improvement to the level of community ambulator in order to participate more easily in activities outside of the home such as adapted golf.   Baseline: 0.77 m/s; 0.95 m/sec (5/23) Goal status: MET  5. Patient will improve BBS score to >/= 38/56 to demonstrate improved balance Baseline: 29/56; 40/56 (5/23) Goal status: MET  LONG TERM GOALS: Target date: 10/24/2022  Patient will report demonstrate independence with final HEP in order to maintain current gains and continue to progress after physical therapy discharge.   Baseline: Established and updated, pt not compliant (6/19) Goal status: NOT MET  2.   Patient will improve their 5x Sit to Stand score to 15 seconds to demonstrate a decreased risk for falls and improved LE strength.   Baseline: 22.45" without UE support/unsteady WBOS; 20.16 seconds w/o UE support, pt steady (6/19) Goal status: PARTIALLY MET  3.  Patient will improve TUG score to 15 seconds or less with LRAD to indicate a decreased risk of falls and  demonstrate improved overall mobility.  Baseline: 21.27" with RW; 12.97 seconds w/ SPC (5/23) Goal status: REVISED-MET 5/23  4.  Patient will improve gait speed to 1.0 m/s to indicate improvement to the level of community ambulator in order to participate more easily in  activities outside of the home such as adapted golf.   Baseline: 0.77 m/s; 1.02 m/sec OR 3.37 ft/sec (6/19) Goal status: MET  5.  Patient will improve FOTO score to 57 to achieve predicted improvements in functional mobility due to skilled physical therapy interventions to increase safety with and participation in daily activities. Baseline: 49; 58% (6/19) Goal status: MET  6.  Patient will improve BBS score to >/= 47/56 to demonstrate improved balance Baseline: 29/56; 49/56 (6/19) Goal status: MET  ASSESSMENT:  CLINICAL IMPRESSION: Patient met or partially met all functional goals this session with only goal unmet being HEP compliance goal as patient endorses not having worked on these much.  He demonstrated decreased fall risk with BERG score improvement to 49/56 which is congruent with reliance on [facility] for safest ambulation at current.  He is progressing well to ambulating some without his [facility] with discussion of further practice at home emphasizing maintained pathway.  His FOTO survey score improved to 58% which was above target.  He ambulates with a speed of 1.02 m/sec which is just below normal limits and significantly improved from starting time.  His TUG time improved to goal level and WNL at ST[facility] assessment and was not reassessed today due to this.  At this time patient wishes to discharge and was provided several things to continue progress at home.  Patient would be appropriate to return to clinic in future to address lingering deficits with a new referral as desired.  OBJECTIVE IMPAIRMENTS: Abnormal gait, cardiopulmonary status limiting activity, decreased activity tolerance, decreased balance, decreased cognition, decreased coordination, decreased endurance, decreased knowledge of condition, decreased mobility, difficulty walking, decreased ROM, decreased strength, and impaired tone.   ACTIVITY LIMITATIONS: carrying, lifting, bending, sitting, standing, squatting, stairs, transfers,  reach over head, locomotion level, and caring for others  PARTICIPATION LIMITATIONS: cleaning, driving, shopping, community activity, occupation, and yard work, Systems analyst  PERSONAL FACTORS: Past/current experiences and 3+ comorbidities: see above  are also affecting patient's functional outcome, reliance on others for transportation   REHAB POTENTIAL: Good - may be limited by complex medical history  CLINICAL DECISION MAKING: Evolving/moderate complexity  EVALUATION COMPLEXITY: Moderate  PLAN:  PT FREQUENCY: 2x/week (dropped to 1x/wk on 5/23 per pt request)  PT DURATION: 8 weeks  PLANNED INTERVENTIONS: Therapeutic exercises, Therapeutic activity, Neuromuscular re-education, Balance training, Gait training, Patient/Family education, Self Care, Joint mobilization, Stair training, Vestibular training, DME instructions, Wheelchair mobility training, Manual therapy, and Re-evaluation  PLAN FOR NEXT SESSION: N/A  Sadie Haber, PT, DPT 10/22/2022, 11:50 AM

## 2022-10-22 NOTE — Patient Instructions (Signed)
   Adaptive golf is available thought [employer] - google adaptive golf [place]   Get into a consistent routine with speech and physical therapy exercises, for example after breakfast or lunch every day  Processing is slow   Keeping up in group conversations and fast moving conversations is hardest. You have to process what is being said and what you want to say while the topics are changing  rapidly  Use your hand or finger to  indicate you have something to say  Self advocate "I have some thing to say on the last topic"  Encourage family and friends to pause to include you in the conversation "[name], what do you think about...."  Get the persons attention before you speak  Use eye contact and face the person you are speaking to  Be in close proximity to the person you are speaking to  Turn down any noise in the environment such as the TV, walk away from loud appliances, air conditioners, fans, dish washers etc  In large gatherings, sit or stay on the side not the center of the room  Try to sit with a wall behind you or in a corner so noise isn't coming at you from all directions when dining out or attending gatherings

## 2022-10-22 NOTE — Therapy (Signed)
OUTPATIENT SPEECH LANGUAGE PATHOLOGY TREATMENT NOTE & DISCHARGE SUMMARY   Patient Name: Christopher Yu MRN: [id number] DOB:[birth year], 62 y.o., male Today's Date: 10/22/2022  PCP: Thana Ates, MD REFERRING PROVIDER: Charlton Amor, PA-C   END OF SESSION:   End of Session - 10/22/22 1019     Visit Number 11    Number of Visits 17    Date for SLP Re-Evaluation 10/24/22    Authorization Type BC[employer]    SLP Start Time 1015    SLP Stop Time  1100    SLP Time Calculation (min) 45 min    Activity Tolerance Patient tolerated treatment well                  Past Medical History:  Diagnosis Date   Anxiety    Arthritis, rheumatoid (HCC)    dx 1988   CAD (coronary artery disease), native coronary artery    Mild LAD disease with calcification noted at cath 12//27/16    Cardiomyopathy (HCC)    Cardiomyopathy (HCC)    Chronic systolic heart failure (HCC) 05/01/2015   Depression    Deviated nasal septum 06/25/2011   Headache    High cholesterol    Hyperlipidemia    Impingement syndrome of left shoulder 12/22/2017   Macular degeneration    Nausea and vomiting 08/28/2022   Rheumatoid aortitis    Sleep apnea    does not use cpap - UNABLE TO TOLERATE MASK   Sleep apnea in adult    deviated septum repaired, most recent sleep study was negative   Status post total right knee replacement 06/01/2015   Past Surgical History:  Procedure Laterality Date   ANKLE FUSION Right 05/05/2012   related to his arthritis   ANKLE FUSION Left 05/25/2013   related to his arthritis   ANKLE SURGERY Bilateral    APPLICATION OF CRANIAL NAVIGATION N/A 04/11/2022   Procedure: APPLICATION OF CRANIAL NAVIGATION;  Surgeon: Lisbeth Renshaw, MD;  Location: [facility] OR;  Service: Neurosurgery;  Laterality: N/A;   APPLICATION OF CRANIAL NAVIGATION N/A 08/08/2022   Procedure: APPLICATION OF CRANIAL NAVIGATION;  Surgeon: Lisbeth Renshaw, MD;  Location: [facility] OR;  Service: Neurosurgery;   Laterality: N/A;   CARDIAC CATHETERIZATION N/A 05/01/2015   Procedure: Left Heart Cath and Coronary Angiography;  Surgeon: Lyn Records, MD;  Location: [facility] INVASIVE CV LAB;  Service: Cardiovascular;  Laterality: N/A;   CRANIOTOMY N/A 04/11/2022   Procedure: STEREOTACTIC SUBOCCIPITAL CRANIOTOMY FOR RESECTION OF ARTERIO-VENOUS MALFORMATION;  Surgeon: Lisbeth Renshaw, MD;  Location: [facility] OR;  Service: Neurosurgery;  Laterality: N/A;   FRACTURE SURGERY     left femur fracture x 3   IR ANGIO EXTERNAL CAROTID SEL EXT CAROTID BILAT MOD SED  01/28/2022   IR ANGIO INTRA EXTRACRAN SEL INTERNAL CAROTID BILAT MOD SED  01/28/2022   IR ANGIO INTRA EXTRACRAN SEL INTERNAL CAROTID BILAT MOD SED  04/16/2022   IR ANGIO VERTEBRAL SEL VERTEBRAL BILAT MOD SED  01/28/2022   IR ANGIO VERTEBRAL SEL VERTEBRAL BILAT MOD SED  04/16/2022   KNEE ARTHROSCOPY     right x 4   LAPAROSCOPIC REVISION VENTRICULAR-PERITONEAL (V-P) SHUNT N/A 08/08/2022   Procedure: LAPAROSCOPIC VENTRICULAR-PERITONEAL (V-P) SHUNT;  Surgeon: Harriette Bouillon, MD;  Location: [facility] OR;  Service: General;  Laterality: N/A;   LUMBAR LAMINECTOMY/DECOMPRESSION MICRODISCECTOMY N/A 07/18/2022   Procedure: Repair of Pseudomeningiocele posterior;  Surgeon: Lisbeth Renshaw, MD;  Location: [facility] OR;  Service: Neurosurgery;  Laterality: N/A;   NASAL SEPTOPLASTY W/ TURBINOPLASTY  06/25/2011  Procedure: NASAL SEPTOPLASTY WITH TURBINATE REDUCTION;  Surgeon: Osborn Coho, MD;  Location: [facility] OR;  Service: ENT;  Laterality: Bilateral;   PLACEMENT OF LUMBAR DRAIN N/A 07/18/2022   Procedure: PLACEMENT OF LUMBAR DRAIN;  Surgeon: Lisbeth Renshaw, MD;  Location: [facility] OR;  Service: Neurosurgery;  Laterality: N/A;   TONSILLECTOMY     TOTAL KNEE ARTHROPLASTY Right 06/01/2015   Procedure: RIGHT TOTAL KNEE ARTHROPLASTY;  Surgeon: Kathryne Hitch, MD;  Location: [facility] ORS;  Service: Orthopedics;  Laterality: Right;  Block+general   VENTRICULOPERITONEAL SHUNT Right 08/08/2022    Procedure: LAP ASSITED SHUNT INSERTION VENTRICULAR-PERITONEAL;  Surgeon: Lisbeth Renshaw, MD;  Location: [facility] OR;  Service: Neurosurgery;  Laterality: Right;   Patient Active Problem List   Diagnosis Date Noted   Insomnia 10/20/2022   Anxiety and depression 10/20/2022   Nausea and vomiting 08/28/2022   Adjustment disorder 08/06/2022   Protein-calorie malnutrition, severe 07/31/2022   Cerebellar stroke (HCC) 07/29/2022   Ischemic cerebrovascular accident (CVA) (HCC) 07/28/2022   Prolonged QT interval 07/28/2022   Pseudomeningocele 07/18/2022   Pseudomeningocele due to surgical procedure 07/18/2022   Status post craniotomy 04/11/2022   AVM (arteriovenous malformation) brain 04/11/2022   Impingement syndrome of left shoulder 12/22/2017   Impingement syndrome of right shoulder 12/22/2017   Rheumatoid arthritis involving right knee (HCC) 06/01/2015   Status post total right knee replacement 06/01/2015   Cardiomyopathy (HCC) 05/02/2015   Hyperlipidemia    Sleep apnea in adult    Rheumatoid arthritis (HCC)    Chronic systolic heart failure (HCC) 05/01/2015   CAD (coronary artery disease), native coronary artery     ONSET DATE: 07/28/22  REFERRING DIAG:  I63.9 (ICD-10-CM) - Cerebral infarction, unspecified   THERAPY DIAG:  Dysarthria and anarthria  Dysphagia, unspecified type  Rationale for Evaluation and Treatment: Rehabilitation  SUBJECTIVE:   SUBJECTIVE STATEMENT: "getting better."   PAIN:  Are you having pain? Yes NPRS scale: 8/10 Pain location: right shoulder Pain orientation: Right  PAIN TYPE: aching Pain description: constant  Aggravating factors: movement Relieving factors: rest   OBJECTIVE:   TODAY'S TREATMENT:                                                                                                                                         DATE:   10/22/22: Essey continues to carryover compensations to be 100% intelligible in conversations 20+minutes  at home and in ST. [name] (spouse) reports she has reduced her requests for Jarian to repeat due to improved intelligibility at least by 50% or more, subjectively. Reviewed HEP for speech - pt completes with min A to mod I. He is not completing EMST or spirometers from [facility]. I confirmed that they are accessible next to his chair at home. MBSS deferred as Dajean has self advanced to regular solids and thin liquids and is electing to defer use of head turn or swallow precautions at this  time. Minimal coughing with PO reported by spouse. Encouraged Dempsey to continue HEP for dysarthria daily as improvement in speech should continue. On Communicative Participation Item Bank, Wesly scored a 16/30, noting a little difficulty in all areas except quite a bit of difficulty in group conversations ad fast moving conversations. We reviewed strategies to compensate for processing and dysarthria to maximize his participation in conversation. See patient instructions  10/20/22: Patient continues to have difficulty breathing demonstrating shortness of breath during the session. Led through [employer]. Patient required frequent mod-A to stay on topic and answer direct questions as he demonstrated limited flexibility due to previous understanding of golf prices. Patient able to talk through sequence of what it takes to hit a golf ball. Patient was able to use visual aids to read information and answer comprehension math and time. SLP led through discussion on future visits with OT/PT/ST, and how to use his benefits to his advantage in the future.   10/06/22: Wife, [name] and daughter Chole present. [name] reports concern re: Jonelle running out of air when talking and before starting to talk, audible breaths. Price endorses both. [name] continues to endorse coughing with PO "occasionally" Today, he required cues to completed left head turn with water sips, resulting in coughing 1/4 sips. Due to pt concerns re: breath support, measured  Maximum Expiratory Pressure (MEP) and Maximum Inspiratory Pressure (MIP) using pressure manometer, nose plugs. Yuvan's MEP was 111 cm/H2O; WNL for his age is 12. MIP was 104, WNL 94. Targeted dysphagia, speech training [name] on EMST 150, set at 70 cm/H20, approx 70% of his MEP, goal to reach 80% of MEP. Quasim demonstrated 5 sets of 5 reps. [name] verbalized understanding - handout provided - see pt instructions. Next session, observe with EMST, educate this will need to be done for 8 weeks min  10/02/22: Targeted conversation topics to practice increasing conversation length and use of dysarthria strategies during conversational speech. Led through debate topics to work on mental flexibility with mod-A of models and cues. Patient demonstrated mental rigidity and required max cues to consider additional perspectives and complete x1 scenario. Spouse mentions that this has been consistent in the past, but that patient is more blunt since the stroke. Led through safe conversation topics to discuss during the party including, education, family. Role play to work on Mudlogger language for future party. HEP sent of ways to avoid un-preferred conversation topics.   09/30/22: Patient has a graduation party this weekend. He plans on avoiding people and not talking. Spouse, and SLP encouraged him to speak more and to plan topics beforehand. Led through answering questions and problem solving. Was able to speak in 30 minute conversation with min-A, and encouragement. Intelligibility at 90%, with occasional collapse of longer words, however, patient was able to clear throat and self correct. Voice sounded less groggy/wet when compared to last week. Patient spoke more with desired topics, ie golf, politics between [place]    09/25/22: Led through cognitive exercises to state similarities and differences between items with min-A. Patient carried over slow rate of speech from structured task to conversational speech  through telling of a story of friends. Speech clear, with min-cuing. Pt states that speech sounds clear when asked to self monitor. Unable to do HEP sent home 5/20 due to graduation. States that graduation required lots of talking and that he feels like it went well. SLP noted voice quality to be wet and groggy, that did not diminish with throat clear  and water, however this did not decrease intelligibility.       PATIENT EDUCATION: Education details: POC, goals, evaluation results/recommendations Person educated: Patient and Spouse Education method: Explanation, Demonstration, Verbal cues, and Handouts Education comprehension: verbalized understanding, returned demonstration, and needs further education     GOALS: Goals reviewed with patient? Yes SPEECH THERAPY DISCHARGE SUMMARY  Visits from Start of Care: 11  Current functional level related to goals / functional outcomes: See goals below   Remaining deficits: Ataxic dysarthria, oropharyngeal dysphagia, processing   Education / Equipment: HEP and compensations for dysarthria, swallow precautions, diet modifications, HEP for dysphagia   Patient agrees to discharge. Patient goals were met. Patient is being discharged due to meeting the stated rehab goals.Marland Kitchen     SHORT TERM GOALS: Target date: 10/03/2022   Pt will teach back dysarthria strategies and compensations with mod-I Baseline: Goal status: MET   2.   Pt will employ dysarthria strategies during structured practice activities resulting in intelligible production at mult-sentence level in 80% of trials Baseline:  Goal status: MET   3. Pt will demonstrate use of swallow compensations/strategies with PO trials with mod-I over 2 sessions  Baseline:  Goal status: PARTIALLY MET   4.  Pt will complete HEP 6/7 days over 1 week period with mod-I Baseline:  Goal status: MET     LONG TERM GOALS: Target date: 10/24/2022   Pt will complete repeat MBSS Baseline:  Goal status:  DEFERRED   2.  Pt will carryover dysarthria strategies/compensations during 20 minute conversation resulting in 90% ineligibility  Baseline:  Goal status: MET   3.  Pt and spouse will report subjective report of reduced need for requests for repetition by 50% at home, over 1 week period Baseline:  Goal status:MET   4.  Pt will report improvement via PROM by dc  Baseline:  Goal status: MET    ASSESSMENT:   CLINICAL IMPRESSION: Patient is a 62 y.o. M who was seen t for dysphagia and motor speech eval s/p cerebellar stroke. He has improved from moderate to mild ataxic dysarthria, speech is 100% intelligible in ST and spouse reports he is more intelligible at home, occasionally requiring cues at home to use dysarthria strategies. Jrue has elected to defer swallow precautions and is tolerating regular solids and thin liquids with occasional cough after PO and no bronchitis or pna. He has elected to defer spirometer and EMST at this time. Goals met, d/c ST - pt in agreement.    OBJECTIVE IMPAIRMENTS: include dysarthria and dysphagia. These impairments are limiting patient from return to work, effectively communicating at home and in community, and safety when swallowing. Factors affecting potential to achieve goals and functional outcome are cooperation/participation level. Patient will benefit from skilled SLP services to address above impairments and improve overall function.   REHAB POTENTIAL: Good   PLAN:   SLP FREQUENCY: 2x/week   SLP DURATION: 8 weeks   PLANNED INTERVENTIONS: Aspiration precaution training, Pharyngeal strengthening exercises, Diet toleration management , Trials of upgraded texture/liquids, Cueing hierachy, Internal/external aids, Functional tasks, Multimodal communication approach, SLP instruction and feedback, Compensatory strategies, Patient/family education, and Re-evaluation     Dylana Shaw, Radene Journey, CCC-SLP 10/22/2022, 1:17 PM

## 2022-10-25 DIAGNOSIS — I639 Cerebral infarction, unspecified: Secondary | ICD-10-CM | POA: Diagnosis not present

## 2022-10-31 ENCOUNTER — Encounter: Payer: Self-pay | Admitting: Neurology

## 2022-11-10 DIAGNOSIS — I429 Cardiomyopathy, unspecified: Secondary | ICD-10-CM | POA: Diagnosis not present

## 2022-11-10 DIAGNOSIS — M79643 Pain in unspecified hand: Secondary | ICD-10-CM | POA: Diagnosis not present

## 2022-11-10 DIAGNOSIS — M0579 Rheumatoid arthritis with rheumatoid factor of multiple sites without organ or systems involvement: Secondary | ICD-10-CM | POA: Diagnosis not present

## 2022-11-10 DIAGNOSIS — Z79899 Other long term (current) drug therapy: Secondary | ICD-10-CM | POA: Diagnosis not present

## 2022-11-12 ENCOUNTER — Other Ambulatory Visit: Payer: Self-pay | Admitting: Physical Medicine and Rehabilitation

## 2022-11-12 DIAGNOSIS — Q282 Arteriovenous malformation of cerebral vessels: Secondary | ICD-10-CM | POA: Diagnosis not present

## 2022-11-12 DIAGNOSIS — G919 Hydrocephalus, unspecified: Secondary | ICD-10-CM | POA: Diagnosis not present

## 2022-11-13 ENCOUNTER — Encounter: Payer: Self-pay | Admitting: Neurology

## 2022-11-13 ENCOUNTER — Ambulatory Visit (INDEPENDENT_AMBULATORY_CARE_PROVIDER_SITE_OTHER): Payer: BC Managed Care – PPO | Admitting: Neurology

## 2022-11-13 VITALS — BP 126/82 | HR 92 | Wt 181.8 lb

## 2022-11-13 DIAGNOSIS — F339 Major depressive disorder, recurrent, unspecified: Secondary | ICD-10-CM | POA: Diagnosis not present

## 2022-11-13 DIAGNOSIS — R251 Tremor, unspecified: Secondary | ICD-10-CM

## 2022-11-13 DIAGNOSIS — G96198 Other disorders of meninges, not elsewhere classified: Secondary | ICD-10-CM | POA: Diagnosis not present

## 2022-11-13 DIAGNOSIS — R270 Ataxia, unspecified: Secondary | ICD-10-CM | POA: Diagnosis not present

## 2022-11-13 DIAGNOSIS — R9402 Abnormal brain scan: Secondary | ICD-10-CM

## 2022-11-13 NOTE — Progress Notes (Signed)
Assessment/Plan:   1.  Ataxic Tremor  -This is likely due to cerebellar outflow tremor/ataxic tremor.  Patient has had initial suboccipital craniectomy for resection of AVM in the cerebellum and subsequently developed a pseudomeningocele, which was repaired.  He then came back with an acute infarct in the right cerebellar hemisphere.  He subsequently developed a large cystic lesion that created mass effect and subsequent hydrocephalus, requiring VP shunt.  He continues to have a growing, large pseudomeningocele, last measured at 7.8 cm x 5.9 cm.  Will repeat CT.  Pt doesn't want MRI due to extreme claustrophobia.  Meningocele likely best imaged with CT anyway  -Discussed with patient that ataxic tremor is notoriously difficult to treat and many times does not resolve with any medication.  Did offer tx, but discussed that meds usually don't help.  After a long discussion, he decided to hold on med trial  -discussed weighted gloves for use for golfing  -Patient very much wants to get back to golfing and is frustrated with the ataxia.  This is leading to depression as well.  I encouraged him to start with a golf personal trainer, as he is hitting is going to be very different than it was prior to the events of above.  I do think, however, that he has reasonable goals of getting back to playing golf.  2.  Depression, as a consequence of the above  -Talked about counseling.  He and his wife state that they have talked about this previously, but the patient really wants a male Veterinary surgeon.  I spoke to Dr. Dellia Cloud personally about the patients case and he is willing to see him.  Appreciate the collegiality.   Subjective:   Christopher Yu was seen in consultation in the movement disorder clinic at the request of Lisbeth Renshaw, MD.  The evaluation is for tremor.  Numerous records made available to me are reviewed.  Patient was admitted to the hospital in December, 2023 for recently  discovered incidental superficial cerebellar AVM.  On April 11, 2022 he underwent suboccipital craniectomy for resection of the AVM.  Postoperatively, he did have significant headache and neck spasms.  He was discharged on postop day 6.  3 months later, the patient was doing to complain about progressive fatigue and there was an enlarging pseudomeningocele at the operative site, so the patient underwent repair and placement of lumbar drain on July 18, 2022.  He was discharged, but returned back to the hospital quickly with headache, slurred speech and gait instability.  He saw neurology.  There was a new small focus of acute infarction in the right superior cerebellar hemisphere with surrounding increased T2 signal and cystic change.  Patient was given steroids and then was admitted to rehab.  While in rehab, the patient continued to have increasing dysarthria and cognitive slowing and upper extremity ataxia and repeat CT was done on August 06, 2022.  There was increased size of the cystic lesion along the roof of the fourth ventricle and the right cerebellar hemisphere and increased mass effect on the inferior fourth ventricle.  The cystic lesion measured 3.6 cm x 2.6 cm.  Dr. Jake Samples was consulted for second opinion and because of growing hydrocephalus, a VP shunt was recommended.  VP shunt was placed on August 08, 2022.  His most recent CT brain was done April 24.  The size of the lateral ventricles was stable, without hydrocephalus.  However, the size of the pseudomeningocele continues to grow, now 7.8 cm x  5.9 cm.  He also has a right superior cerebellar cysts, measuring 2.5 x 1.6 cm.  I have reviewed the films personally.  Wife states that she noted after the initial AVM surgery in 04/2022 pt had weakness and incoordination issues on the L.  Then, while in rehab, he had tremor in the face and head, which led to pursuing of the shunt (amongst other issues).  He had tremor in the hospital but it seemed to get a  bit better after the shunt but now its getting worse.  He particularly notes it when going to grab an object.  It is just the L hand.  He is R handed but used to be more ambidexterous.    He also leans to the L with ambulation.  He currently does pretty well with swallowing (was previously aspirating with liquid and was thick liquid but now no longer).  He has no diplopia.     Allergies  Allergen Reactions   Hydroxychloroquine     Other Reaction(s): cardiolmyopathy    Current Meds  Medication Sig   Abatacept (ORENCIA) 50 MG/0.4ML SOSY Inject into the skin.   acetaminophen (TYLENOL) 325 MG tablet Take 2 tablets (650 mg total) by mouth 3 (three) times daily. (Patient taking differently: Take 650 mg by mouth as needed.)   alendronate (FOSAMAX) 10 MG tablet Take 10 mg by mouth daily before breakfast. Take with a full glass of water on an empty stomach.   artificial tears (LACRILUBE) OINT ophthalmic ointment Place into both eyes 2 (two) times daily.   aspirin EC 81 MG tablet Take 1 tablet (81 mg total) by mouth daily. Swallow whole.   calcium carbonate (TUMS - DOSED IN MG ELEMENTAL CALCIUM) 500 MG chewable tablet Chew 1 tablet (200 mg of elemental calcium total) by mouth daily with supper. (Patient taking differently: Chew 1 tablet by mouth as needed.)   Cholecalciferol (VITAMIN D3) 50 MCG (2000 UT) TABS Take 2,000 Units by mouth daily.   diclofenac Sodium (VOLTAREN) 1 % GEL Apply 2 g topically 4 (four) times daily. (Patient taking differently: Apply 2 g topically as needed.)   diphenhydrAMINE (SOMINEX) 25 MG tablet Take 25 mg by mouth at bedtime as needed for sleep.   folic acid (FOLVITE) 1 MG tablet Take 1 mg by mouth daily.   methotrexate (RHEUMATREX) 2.5 MG tablet Take 20 mg by mouth once a week. Takes 8 tablets(20mg ) every Saturday.     Caution:Chemotherapy. Protect from light.   metoprolol succinate (TOPROL XL) 25 MG 24 hr tablet Take 1 tablet (25 mg total) by mouth daily. (Patient taking  differently: Take 12.5 mg by mouth daily.)   Multiple Vitamins-Minerals (PRESERVISION AREDS 2 PO) Take 2 capsules by mouth daily.   Polyethyl Glycol-Propyl Glycol (SYSTANE OP) Apply to eye. 3 - 4 drops 4 times per day   predniSONE (DELTASONE) 10 MG tablet Take 10 mg by mouth every morning.   predniSONE (DELTASONE) 2.5 MG tablet    RITUXAN 500 MG/50ML injection See admin instructions. Every 6 months 2 dose infusion Ruxience   rosuvastatin (CRESTOR) 20 MG tablet 1 tablet Orally Once a day   scopolamine (TRANSDERM-SCOP) 1 MG/3DAYS Place 1 patch onto the skin every 3 (three) days.   scopolamine (TRANSDERM-SCOP) 1 MG/3DAYS 1 patch to skin behind the ear as needed Transdermal for 15 days   sertraline (ZOLOFT) 25 MG tablet Take 3 tablets (75 mg total) by mouth every morning.   sertraline (ZOLOFT) 50 MG tablet 1 tablet Orally Once  a day for 90 days   spironolactone (ALDACTONE) 25 MG tablet 1/2 tablet Orally once a day   sulfaSALAzine (AZULFIDINE) 500 MG tablet Take 1,000 mg by mouth 2 (two) times daily.    traMADol (ULTRAM) 50 MG tablet Take 1 tablet (50 mg total) by mouth every 12 (twelve) hours as needed for moderate pain.   traZODone (DESYREL) 50 MG tablet TAKE 1.5 TABLETS (75 MG TOTAL) BY MOUTH AT BEDTIME AS NEEDED. FOR SLEEP   White Petrolatum-Mineral Oil (GENTEAL TEARS NIGHT-TIME OP) Apply to eye at bedtime.      Objective:   VITALS:   Vitals:   11/13/22 1315  BP: 126/82  Pulse: 92  SpO2: 94%  Weight: 181 lb 12.8 oz (82.5 kg)   Gen:  Appears stated age and in NAD.   Intermittently frustrated with situation and tearful HEENT:  Normocephalic, atraumatic. The mucous membranes are moist. The superficial temporal arteries are without ropiness or tenderness. Cardiovascular: Regular rate and rhythm. Lungs: Clear to auscultation bilaterally. Neck: There are no carotid bruits noted bilaterally.  NEUROLOGICAL:  Orientation:  The patient is alert and oriented x 3.  Cranial nerves: There is  good facial symmetry. Extraocular muscles are intact and visual fields are full to confrontational testing. He has scanning type of speech.  Soft palate rises symmetrically and there is no tongue deviation. Hearing is intact to conversational tone. Tone: Tone is good throughout. Sensation: Sensation is intact to light touch touch throughout (facial, trunk, extremities). Vibration is intact at the bilateral ankle. There is no extinction with double simultaneous stimulation. There is no sensory dermatomal level identified. Coordination:  The patient has no decremation with rapid alternating movements.  He is ataxic with finger-to-nose on the left. Motor: Strength is 5/5 in the bilateral upper and lower extremities.  Shoulder shrug is equal bilaterally.  There is no pronator drift.  There are no fasciculations noted. DTR's: Deep tendon reflexes are 2/4 at the bilateral biceps, triceps, brachioradialis right patella, 3/4 at the left patella. Plantar response is up on the left and down on the right Gait and Station: The patient pushes off the chair to arise.  He ambulates with his cane.  He is just slightly ataxic  MOVEMENT EXAM: Tremor:  There is no rest or postural tremor.  He is ataxic with finger-to-nose on the left.  He has trouble getting the pen to the paper with the left hand when asked to do Archimedes spirals.      I have reviewed and interpreted the following labs independently   Chemistry      Component Value Date/Time   NA 141 08/27/2022 1910   K 3.6 08/27/2022 1910   CL 106 08/27/2022 1910   CO2 24 08/27/2022 1910   BUN 14 08/27/2022 1910   CREATININE 0.78 08/27/2022 1910      Component Value Date/Time   CALCIUM 9.3 08/27/2022 1910   ALKPHOS 83 08/27/2022 1910   AST 21 08/27/2022 1910   ALT 27 08/27/2022 1910   BILITOT 0.7 08/27/2022 1910      Lab Results  Component Value Date   WBC 8.1 08/27/2022   HGB 10.5 (L) 08/27/2022   HCT 33.7 (L) 08/27/2022   MCV 75.2 (L)  08/27/2022   PLT 192 08/27/2022   No results found for: "TSH"    Total time spent on today's visit was 97 minutes, including both face-to-face time and nonface-to-face time.  Time included that spent on review of records (prior notes available to me/labs/imaging  if pertinent), discussing treatment and goals, answering patient's questions and coordinating care.  CC:  Thana Ates, MD

## 2022-11-19 DIAGNOSIS — R110 Nausea: Secondary | ICD-10-CM | POA: Diagnosis not present

## 2022-11-19 DIAGNOSIS — E229 Hyperfunction of pituitary gland, unspecified: Secondary | ICD-10-CM | POA: Diagnosis not present

## 2022-11-19 DIAGNOSIS — F339 Major depressive disorder, recurrent, unspecified: Secondary | ICD-10-CM | POA: Diagnosis not present

## 2022-11-19 DIAGNOSIS — I5022 Chronic systolic (congestive) heart failure: Secondary | ICD-10-CM | POA: Diagnosis not present

## 2022-11-19 DIAGNOSIS — Z125 Encounter for screening for malignant neoplasm of prostate: Secondary | ICD-10-CM | POA: Diagnosis not present

## 2022-11-19 DIAGNOSIS — Z0000 Encounter for general adult medical examination without abnormal findings: Secondary | ICD-10-CM | POA: Diagnosis not present

## 2022-11-19 DIAGNOSIS — E441 Mild protein-calorie malnutrition: Secondary | ICD-10-CM | POA: Diagnosis not present

## 2022-11-19 DIAGNOSIS — D649 Anemia, unspecified: Secondary | ICD-10-CM | POA: Diagnosis not present

## 2022-11-21 ENCOUNTER — Telehealth: Payer: Self-pay

## 2022-11-21 ENCOUNTER — Encounter: Payer: Self-pay | Admitting: Neurology

## 2022-11-21 NOTE — Telephone Encounter (Signed)
[facility] PA dept called to let us know that the insurance on file for this patient is no longer active and until they reach patient they will cancel his appointment on Monday the 22nd. I tried to call [facility] back and speak to Kendal Hymen who left the message at [phone] left a voicemail and also reached out to the patient

## 2022-11-24 ENCOUNTER — Telehealth: Payer: Self-pay | Admitting: Neurology

## 2022-11-24 ENCOUNTER — Telehealth: Payer: Self-pay

## 2022-11-24 ENCOUNTER — Other Ambulatory Visit: Payer: BC Managed Care – PPO

## 2022-11-24 NOTE — Telephone Encounter (Signed)
Patient was suppose to do a ct scan today, [facility] called Friday to cancel appt, bc no PA was done. Insurance called and said the form hasn't been sent from Dr. Sharion Dove   This is the message that I received from the patients wife. Patient needed a CT scan I was never informed of this and patient is told by [facility] that it is our fault. They are awaiting important results. I don't know what to do but this is really serious.   Sent this to [facility] also called patients wife and said that I would report this

## 2022-11-24 NOTE — Telephone Encounter (Signed)
Called patients wife and turned into [facility] for help

## 2022-11-24 NOTE — Telephone Encounter (Signed)
Patient was suppose to do a ct scan today, [facility] called Friday to cancel appt, bc no PA was done. Insurance called and said the form hasn't been sent from Dr. Sharion Dove

## 2022-11-25 ENCOUNTER — Ambulatory Visit
Admission: RE | Admit: 2022-11-25 | Discharge: 2022-11-25 | Disposition: A | Discharge status: Home / Self Care | Payer: BC Managed Care – PPO | Source: Ambulatory Visit | Attending: Neurology | Admitting: Neurology

## 2022-11-25 ENCOUNTER — Telehealth: Payer: Self-pay

## 2022-11-25 DIAGNOSIS — G96198 Other disorders of meninges, not elsewhere classified: Secondary | ICD-10-CM

## 2022-11-25 DIAGNOSIS — Z982 Presence of cerebrospinal fluid drainage device: Secondary | ICD-10-CM | POA: Diagnosis not present

## 2022-11-25 NOTE — Telephone Encounter (Signed)
Patient was suppose to do a ct scan today, [facility] called Friday to cancel appt, bc no PA was done. Insurance called and said the form hasn't been sent from Dr. Sharion Dove   This is the message that I received from the patients wife. Patient needed a CT scan I was never informed of this and patient is told by [facility] that it is our fault. They are awaiting important results. I don't know what to do but this is really serious.   Mon 4:21 PM TM Terrace Arabia Sending back to scheduling, he shouldn't had been cancelled b/c his plan don't require an [place]. Sorry for confusion.   6:56 AM Thank you for getting back to me . I appreciate your help. Can we try to get patient in this week   7:34 AM JS Zollie Scale left. 7:38 AM Terrace Arabia I sent back to scheduling and will let you know. I don't schedule so I don't know what it is looking like but I did say ASAP  1  7:39 AM TM pt scheduled for today  8:13 AM Thank You very much. Im sorry we always reach out to you to fix a situation that you did not cause. You are always willing to help and we appreciate that   8:15 AM TM Taron Kelby Fam You are more than welcome!

## 2022-11-25 NOTE — Telephone Encounter (Signed)
[employer] just called office and stated that this patient did require a PA for his CT scan [phone]. They just called and asked when he was scheduled and front desk told them today   29 mins TM Christopher Yu Go to Chart Review under Media and that is the information I pulled off of [employer]  1 min JS Zollie Scale left. 1 min Christopher Yu labeled as Berkley Harvey  TM it said no auth req'd for MRI or CT  Now I dont know why they are still calling about this

## 2022-12-03 ENCOUNTER — Ambulatory Visit (INDEPENDENT_AMBULATORY_CARE_PROVIDER_SITE_OTHER): Payer: BC Managed Care – PPO | Admitting: Psychology

## 2022-12-03 DIAGNOSIS — F321 Major depressive disorder, single episode, moderate: Secondary | ICD-10-CM

## 2022-12-03 NOTE — Progress Notes (Signed)
[facility] Counselor Initial Adult Exam  Name: Christopher Yu Date: 12/03/2022 MRN: [id number] DOB: 06/16/1960 PCP: Thana Ates, MD  Time spent: 60 minutes  Guardian/Payee:  N/A    Paperwork requested: No   Reason for Visit /Presenting Problem: Adjustment to serious medical illness  Mental Status Exam: Appearance:   Casual     Behavior:  Appropriate  Motor:  Shuffling Gait  Speech/Language:   Slurred  Affect:  Depressed  Mood:  depressed  Thought process:  normal  Thought content:    WNL  Sensory/Perceptual disturbances:    WNL  Orientation:  oriented to person, place, and situation  Attention:  Good  Concentration:  Good  Memory:  WNL  Fund of knowledge:   Good  Insight:    Good  Judgment:   unknown  Impulse Control:  unknown    Reported Symptoms:  Sadness, helplessness and hopelessness  Risk Assessment: Danger to Self:  No Self-injurious Behavior: No Danger to Others: No Duty to Warn:no Physical Aggression / Violence:No  Access to Firearms a concern:  Unknown Gang Involvement:No  Patient / guardian was educated about steps to take if suicide or homicide risk level increases between visits: n/a While future psychiatric events cannot be accurately predicted, the patient does not currently require acute inpatient psychiatric care and does not currently meet [facility] involuntary commitment criteria.  Substance Abuse History: Current substance abuse: No     Past Psychiatric History:   No previous psychological problems have been observed Outpatient Providers:N/A History of Psych Hospitalization: No  Psychological Testing:  none    Abuse History:  Victim of:   N/A   Report needed: No. Victim of Neglect:No. Perpetrator of  N/A   Witness / Exposure to Domestic Violence: No   Protective Services Involvement: No  Witness to [employer] Violence:  No   Family History:  Family History  Problem  Relation Age of Onset   Other Father        MVA   Lupus Sister     Living situation: the patient lives with their family  Sexual Orientation: Straight  Relationship Status: married  Name of spouse [name] If a parent, number of children / ages:Two girls, ages 37 and 31  Support Systems: spouse  Surveyor, quantity Stress:  Yes   Income/Employment/Disability: Software engineer:  unknown  Educational History: Education:  unknown  Oncologist: unknown  Any cultural differences that may affect / interfere with treatment:  not applicable   Recreation/Hobbies: golfing  Stressors: Financial difficulties   Health problems    Strengths: Family  Barriers:  severe physical limitations that compromises activity   Legal History: Pending legal issue / charges: The patient has no significant history of legal issues. History of legal issue / charges:  N/A  Medical History/Surgical History: reviewed Past Medical History:  Diagnosis Date   Anxiety    Arthritis, rheumatoid (HCC)    dx 1988   CAD (coronary artery disease), native coronary artery    Mild LAD disease with calcification noted at cath 12//27/16    Cardiomyopathy (HCC)    Cardiomyopathy (HCC)    Chronic systolic heart failure (HCC) 05/01/2015   Depression    Deviated nasal septum 06/25/2011   Headache    High cholesterol    Hyperlipidemia    Impingement syndrome of left shoulder 12/22/2017   Macular degeneration    Nausea  and vomiting 08/28/2022   Rheumatoid aortitis    Sleep apnea    does not use cpap - UNABLE TO TOLERATE MASK   Sleep apnea in adult    deviated septum repaired, most recent sleep study was negative   Status post total right knee replacement 06/01/2015    Past Surgical History:  Procedure Laterality Date   ANKLE FUSION Right 05/05/2012   related to his arthritis   ANKLE FUSION Left 05/25/2013   related to his arthritis   ANKLE SURGERY Bilateral     APPLICATION OF CRANIAL NAVIGATION N/A 04/11/2022   Procedure: APPLICATION OF CRANIAL NAVIGATION;  Surgeon: Lisbeth Renshaw, MD;  Location: [facility] OR;  Service: Neurosurgery;  Laterality: N/A;   APPLICATION OF CRANIAL NAVIGATION N/A 08/08/2022   Procedure: APPLICATION OF CRANIAL NAVIGATION;  Surgeon: Lisbeth Renshaw, MD;  Location: [facility] OR;  Service: Neurosurgery;  Laterality: N/A;   CARDIAC CATHETERIZATION N/A 05/01/2015   Procedure: Left Heart Cath and Coronary Angiography;  Surgeon: Lyn Records, MD;  Location: [facility] INVASIVE CV LAB;  Service: Cardiovascular;  Laterality: N/A;   CRANIOTOMY N/A 04/11/2022   Procedure: STEREOTACTIC SUBOCCIPITAL CRANIOTOMY FOR RESECTION OF ARTERIO-VENOUS MALFORMATION;  Surgeon: Lisbeth Renshaw, MD;  Location: [facility] OR;  Service: Neurosurgery;  Laterality: N/A;   FRACTURE SURGERY     left femur fracture x 3   IR ANGIO EXTERNAL CAROTID SEL EXT CAROTID BILAT MOD SED  01/28/2022   IR ANGIO INTRA EXTRACRAN SEL INTERNAL CAROTID BILAT MOD SED  01/28/2022   IR ANGIO INTRA EXTRACRAN SEL INTERNAL CAROTID BILAT MOD SED  04/16/2022   IR ANGIO VERTEBRAL SEL VERTEBRAL BILAT MOD SED  01/28/2022   IR ANGIO VERTEBRAL SEL VERTEBRAL BILAT MOD SED  04/16/2022   KNEE ARTHROSCOPY     right x 4   LAPAROSCOPIC REVISION VENTRICULAR-PERITONEAL (V-P) SHUNT N/A 08/08/2022   Procedure: LAPAROSCOPIC VENTRICULAR-PERITONEAL (V-P) SHUNT;  Surgeon: Harriette Bouillon, MD;  Location: [facility] OR;  Service: General;  Laterality: N/A;   LUMBAR LAMINECTOMY/DECOMPRESSION MICRODISCECTOMY N/A 07/18/2022   Procedure: Repair of Pseudomeningiocele posterior;  Surgeon: Lisbeth Renshaw, MD;  Location: [facility] OR;  Service: Neurosurgery;  Laterality: N/A;   NASAL SEPTOPLASTY W/ TURBINOPLASTY  06/25/2011   Procedure: NASAL SEPTOPLASTY WITH TURBINATE REDUCTION;  Surgeon: Osborn Coho, MD;  Location: [facility] OR;  Service: ENT;  Laterality: Bilateral;   PLACEMENT OF LUMBAR DRAIN N/A 07/18/2022   Procedure: PLACEMENT OF LUMBAR  DRAIN;  Surgeon: Lisbeth Renshaw, MD;  Location: [facility] OR;  Service: Neurosurgery;  Laterality: N/A;   TONSILLECTOMY     TOTAL KNEE ARTHROPLASTY Right 06/01/2015   Procedure: RIGHT TOTAL KNEE ARTHROPLASTY;  Surgeon: Kathryne Hitch, MD;  Location: [facility] ORS;  Service: Orthopedics;  Laterality: Right;  Block+general   VENTRICULOPERITONEAL SHUNT Right 08/08/2022   Procedure: LAP ASSITED SHUNT INSERTION VENTRICULAR-PERITONEAL;  Surgeon: Lisbeth Renshaw, MD;  Location: [facility] OR;  Service: Neurosurgery;  Laterality: Right;    Medications: Current Outpatient Medications  Medication Sig Dispense Refill   Abatacept (ORENCIA) 50 MG/0.4ML SOSY Inject into the skin.     acetaminophen (TYLENOL) 325 MG tablet Take 2 tablets (650 mg total) by mouth 3 (three) times daily. (Patient taking differently: Take 650 mg by mouth as needed.)     alendronate (FOSAMAX) 10 MG tablet Take 10 mg by mouth daily before breakfast. Take with a full glass of water on an empty stomach.     artificial tears (LACRILUBE) OINT ophthalmic ointment Place into both eyes 2 (two) times daily.     aspirin EC  81 MG tablet Take 1 tablet (81 mg total) by mouth daily. Swallow whole. 30 tablet 12   calcium carbonate (TUMS - DOSED IN MG ELEMENTAL CALCIUM) 500 MG chewable tablet Chew 1 tablet (200 mg of elemental calcium total) by mouth daily with supper. (Patient taking differently: Chew 1 tablet by mouth as needed.)     Cholecalciferol (VITAMIN D3) 50 MCG (2000 UT) TABS Take 2,000 Units by mouth daily.     diazepam (VALIUM) 5 MG tablet Take 1 tablet (5 mg total) by mouth every 8 (eight) hours as needed for muscle spasms. (Patient not taking: Reported on 11/13/2022) 30 tablet 0   diclofenac Sodium (VOLTAREN) 1 % GEL Apply 2 g topically 4 (four) times daily. (Patient taking differently: Apply 2 g topically as needed.)     diphenhydrAMINE (SOMINEX) 25 MG tablet Take 25 mg by mouth at bedtime as needed for sleep.     folic acid (FOLVITE) 1 MG  tablet Take 1 mg by mouth daily.     methotrexate (RHEUMATREX) 2.5 MG tablet Take 20 mg by mouth once a week. Takes 8 tablets(20mg ) every Saturday.     Caution:Chemotherapy. Protect from light.     metoprolol succinate (TOPROL XL) 25 MG 24 hr tablet Take 1 tablet (25 mg total) by mouth daily. (Patient taking differently: Take 12.5 mg by mouth daily.) 90 tablet 3   Multiple Vitamins-Minerals (PRESERVISION AREDS 2 PO) Take 2 capsules by mouth daily.     Polyethyl Glycol-Propyl Glycol (SYSTANE OP) Apply to eye. 3 - 4 drops 4 times per day     predniSONE (DELTASONE) 10 MG tablet Take 10 mg by mouth every morning.     predniSONE (DELTASONE) 2.5 MG tablet      RITUXAN 500 MG/50ML injection See admin instructions. Every 6 months 2 dose infusion Ruxience     rosuvastatin (CRESTOR) 20 MG tablet 1 tablet Orally Once a day     scopolamine (TRANSDERM-SCOP) 1 MG/3DAYS Place 1 patch onto the skin every 3 (three) days.     scopolamine (TRANSDERM-SCOP) 1 MG/3DAYS 1 patch to skin behind the ear as needed Transdermal for 15 days     sertraline (ZOLOFT) 25 MG tablet Take 3 tablets (75 mg total) by mouth every morning. 90 tablet 0   sertraline (ZOLOFT) 50 MG tablet 1 tablet Orally Once a day for 90 days     spironolactone (ALDACTONE) 25 MG tablet 1/2 tablet Orally once a day     sulfaSALAzine (AZULFIDINE) 500 MG tablet Take 1,000 mg by mouth 2 (two) times daily.      traMADol (ULTRAM) 50 MG tablet Take 1 tablet (50 mg total) by mouth every 12 (twelve) hours as needed for moderate pain. 60 tablet 0   traZODone (DESYREL) 50 MG tablet TAKE 1.5 TABLETS (75 MG TOTAL) BY MOUTH AT BEDTIME AS NEEDED. FOR SLEEP 225 tablet 2   White Petrolatum-Mineral Oil (GENTEAL TEARS NIGHT-TIME OP) Apply to eye at bedtime.     No current facility-administered medications for this visit.    Allergies  Allergen Reactions   Hydroxychloroquine     Other Reaction(s): cardiolmyopathy    Diagnoses:  Major depression  Plan of Care:  Outpatient psychotherapy and medication management as needed. Initial Note: (With wife [name]). She says that she has hoped Grahame would get into therapy. He has Ataxic Tremor. He told Dr. Arbutus Leas that he would like medication to just sit and do nothing. He says he always had golf to rey on and now cannot  play. In December, he went n for a surgery to remove an avm from brain. Was told it was a routine surgery and was told that he needs to get it out. Was told it would be a short recovery and he would be back to work. 1 1/2 hour surgery turned into 6 hour surgery and he had complications. He had extra cerebral fluid and Dr. York Spaniel it would absorb and just wait. He kept declining and by March, they went in to try and drain the extra fluid. He was in hospital for a week. Wife noticed that during the hospital stay, his speech declined and he was clearly weak. Nurses were trying to discharge him and she protested, but they sent him home anyway. He declined, and could not sit, stand, talk or walk and she ended up calling ambulance. In ER, she ws told he had a stroke. He was in ER 24 hours before getting a room in Neuro area. Head of stroke team came in and said it was not a stroke, but complications from surgery. She was blamed by medical team for not "speaking up" before being initially discharged. He ended up in a rehab for 2 weeks and told them he needed another surgery. Was told he had hydrocephalus and need a shunt to drain liquid off his brain. She felt a lot of pressure from team that he needed surgery. She wanted a second opinion and had rouble getting it. Spoke to another Dr. and they decided to get the third surgery. He was in OT,PT and Speech Therapy. During therapy, he started having a tremor. They ended up being referred to Dr. Arbutus Leas, who has now told him that treating his tremor is tricky and will take time.  Sneh is no longer in OT,PT speech therapy. He is supposed to be doing exercises on his own, but has not  been emotionally able to ge himself to do it. He has persistent headaches as well. Just had a CAT Scan and awaiting the results. His headaches are worse and they are concerned. He also has rhumatory arthritis and it is flaring up because he had to stop meds. He has cardiomyopathy, macular degeneration on top of all of these other conditions.  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 He has been approved for disability and are working to get social security disability. She works part time. They have 2 daughters, one with diabetes and arthritis. She is heading to [employer] this Fall and wants to work in Research scientist (physical sciences). Other daughter is 43 and is "our introvert with lots of anxiety". She is in therapy and has mental health issues that is being treated. She will be a Holiday representative at the [employer] at [employer].  They are told that medicine will not control his condition, but they will have to try to retrain his brain. Suggeted that they work to get back into OT and PT. She has a hard time motivating him. He admits that he keeps things "inside". She says he is not a good comunicator. He will not share things like if he is in pain or hurts himself.   Tx. Plan/Goals: Patient reports depressive symptoms secondary to the medical trauma he has suffered and the debilitating physical results. He is seeking therapy to resolve his feelings of helplessness and hopelessness. Also want assistance in improving his drive/motivation in daily activity. Will engage in therapy that utilizes behavioral approaches  to mitigate symptoms along with insight oriented counseling. Will also incorporate some conjoint sessions with spouse as needed, given her integral involvement with his care. Patient agrees with plan for treatment.     Garrel Ridgel, PhD

## 2022-12-08 ENCOUNTER — Telehealth: Payer: Self-pay

## 2022-12-08 NOTE — Telephone Encounter (Signed)
Spoke to patients wife and she didn't receive CT results via mychart so I went over that with her she understood and was asking about making another appointment with Dr. Arbutus Leas patients headaches have increased not in the back were his previous surgeries happened but across the top of his head patient unable to take Aleve any longer for the pain it isn't working for him so he is now taking Tramadol because it hurts so bad. They would ike to make an appointment if you can do anything to help with this pain in the head

## 2022-12-08 NOTE — Telephone Encounter (Signed)
Called patiens wife and she is going to reach out to Dr. Shearon Stalls with regards to this pain matter

## 2022-12-09 ENCOUNTER — Ambulatory Visit: Payer: BC Managed Care – PPO | Admitting: Neurology

## 2022-12-21 ENCOUNTER — Encounter: Payer: Self-pay | Admitting: Neurology

## 2022-12-23 NOTE — Telephone Encounter (Signed)
Called  patients wife Christopher Yu and left a voicemail message to speak with me VS my chart message

## 2022-12-31 ENCOUNTER — Ambulatory Visit (INDEPENDENT_AMBULATORY_CARE_PROVIDER_SITE_OTHER): Payer: BC Managed Care – PPO | Admitting: Psychology

## 2022-12-31 DIAGNOSIS — F321 Major depressive disorder, single episode, moderate: Secondary | ICD-10-CM

## 2022-12-31 NOTE — Progress Notes (Signed)
[facility] Counselor Initial Adult Exam  Name: Christopher Yu Date: 12/31/2022 MRN: [id number] DOB: 01-22-1961 PCP: Thana Ates, MD    Guardian/Payee:  N/A    Paperwork requested: No   Reason for Visit Christopher Yu Problem: Adjustment to serious medical illness  Mental Status Exam: Appearance:   Casual     Behavior:  Appropriate  Motor:  Shuffling Gait  Speech/Language:   Slurred  Affect:  Depressed  Mood:  depressed  Thought process:  normal  Thought content:    WNL  Sensory/Perceptual disturbances:    WNL  Orientation:  oriented to person, place, and situation  Attention:  Good  Concentration:  Good  Memory:  WNL  Fund of knowledge:   Good  Insight:    Good  Judgment:   unknown  Impulse Control:  unknown    Reported Symptoms:  Sadness, helplessness and hopelessness  Risk Assessment: Danger to Self:  No Self-injurious Behavior: No Danger to Others: No Duty to Warn:no Physical Aggression / Violence:No  Access to Firearms a concern:  Unknown Gang Involvement:No  Patient / guardian was educated about steps to take if suicide or homicide risk level increases between visits: n/a While future psychiatric events cannot be accurately predicted, the patient does not currently require acute inpatient psychiatric care and does not currently meet [facility] involuntary commitment criteria.  Substance Abuse History: Current substance abuse: No     Past Psychiatric History:   No previous psychological problems have been observed Outpatient Providers:N/A History of Psych Hospitalization: No  Psychological Testing:  none    Abuse History:  Victim of:   N/A   Report needed: No. Victim of Neglect:No. Perpetrator of  N/A   Witness / Exposure to Domestic Violence: No   Protective Services Involvement: No  Witness to [employer] Violence:  No   Family History:  Family History   Problem Relation Age of Onset   Other Father        MVA   Lupus Sister     Living situation: the patient lives with their family  Sexual Orientation: Straight  Relationship Status: married  Name of spouse Christopher Yu If a parent, number of children / ages:Two girls, ages 22 and 25  Support Systems: spouse  Surveyor, quantity Stress:  Yes   Income/Employment/Disability: Software engineer:  unknown  Educational History: Education:  unknown  Oncologist: unknown  Any cultural differences that may affect / interfere with treatment:  not applicable   Recreation/Hobbies: golfing  Stressors: Financial difficulties   Health problems    Strengths: Family  Barriers:  severe physical limitations that compromises activity   Legal History: Pending legal issue / charges: The patient has no significant history of legal issues. History of legal issue / charges:  N/A  Medical History/Surgical History: reviewed Past Medical History:  Diagnosis Date   Anxiety    Arthritis, rheumatoid (HCC)    dx 1988   CAD (coronary artery disease), native coronary artery    Mild LAD disease with calcification noted at cath 12//27/16    Cardiomyopathy (HCC)    Cardiomyopathy (HCC)    Chronic systolic heart failure (HCC) 05/01/2015   Depression    Deviated nasal septum 06/25/2011   Headache    High cholesterol    Hyperlipidemia    Impingement syndrome  of left shoulder 12/22/2017   Macular degeneration    Nausea and vomiting 08/28/2022   Rheumatoid aortitis    Sleep apnea    does not use cpap - UNABLE TO TOLERATE MASK   Sleep apnea in adult    deviated septum repaired, most recent sleep study was negative   Status post total right knee replacement 06/01/2015    Past Surgical History:  Procedure Laterality Date   ANKLE FUSION Right 05/05/2012   related to his arthritis   ANKLE FUSION Left 05/25/2013   related to his arthritis   ANKLE SURGERY Bilateral     APPLICATION OF CRANIAL NAVIGATION N/A 04/11/2022   Procedure: APPLICATION OF CRANIAL NAVIGATION;  Surgeon: Lisbeth Renshaw, MD;  Location: [facility] OR;  Service: Neurosurgery;  Laterality: N/A;   APPLICATION OF CRANIAL NAVIGATION N/A 08/08/2022   Procedure: APPLICATION OF CRANIAL NAVIGATION;  Surgeon: Lisbeth Renshaw, MD;  Location: [facility] OR;  Service: Neurosurgery;  Laterality: N/A;   CARDIAC CATHETERIZATION N/A 05/01/2015   Procedure: Left Heart Cath and Coronary Angiography;  Surgeon: Lyn Records, MD;  Location: [facility] INVASIVE CV LAB;  Service: Cardiovascular;  Laterality: N/A;   CRANIOTOMY N/A 04/11/2022   Procedure: STEREOTACTIC SUBOCCIPITAL CRANIOTOMY FOR RESECTION OF ARTERIO-VENOUS MALFORMATION;  Surgeon: Lisbeth Renshaw, MD;  Location: [facility] OR;  Service: Neurosurgery;  Laterality: N/A;   FRACTURE SURGERY     left femur fracture x 3   IR ANGIO EXTERNAL CAROTID SEL EXT CAROTID BILAT MOD SED  01/28/2022   IR ANGIO INTRA EXTRACRAN SEL INTERNAL CAROTID BILAT MOD SED  01/28/2022   IR ANGIO INTRA EXTRACRAN SEL INTERNAL CAROTID BILAT MOD SED  04/16/2022   IR ANGIO VERTEBRAL SEL VERTEBRAL BILAT MOD SED  01/28/2022   IR ANGIO VERTEBRAL SEL VERTEBRAL BILAT MOD SED  04/16/2022   KNEE ARTHROSCOPY     right x 4   LAPAROSCOPIC REVISION VENTRICULAR-PERITONEAL (V-P) SHUNT N/A 08/08/2022   Procedure: LAPAROSCOPIC VENTRICULAR-PERITONEAL (V-P) SHUNT;  Surgeon: Harriette Bouillon, MD;  Location: [facility] OR;  Service: General;  Laterality: N/A;   LUMBAR LAMINECTOMY/DECOMPRESSION MICRODISCECTOMY N/A 07/18/2022   Procedure: Repair of Pseudomeningiocele posterior;  Surgeon: Lisbeth Renshaw, MD;  Location: [facility] OR;  Service: Neurosurgery;  Laterality: N/A;   NASAL SEPTOPLASTY W/ TURBINOPLASTY  06/25/2011   Procedure: NASAL SEPTOPLASTY WITH TURBINATE REDUCTION;  Surgeon: Osborn Coho, MD;  Location: [facility] OR;  Service: ENT;  Laterality: Bilateral;   PLACEMENT OF LUMBAR DRAIN N/A 07/18/2022   Procedure: PLACEMENT OF LUMBAR  DRAIN;  Surgeon: Lisbeth Renshaw, MD;  Location: [facility] OR;  Service: Neurosurgery;  Laterality: N/A;   TONSILLECTOMY     TOTAL KNEE ARTHROPLASTY Right 06/01/2015   Procedure: RIGHT TOTAL KNEE ARTHROPLASTY;  Surgeon: Kathryne Hitch, MD;  Location: [facility] ORS;  Service: Orthopedics;  Laterality: Right;  Block+general   VENTRICULOPERITONEAL SHUNT Right 08/08/2022   Procedure: LAP ASSITED SHUNT INSERTION VENTRICULAR-PERITONEAL;  Surgeon: Lisbeth Renshaw, MD;  Location: [facility] OR;  Service: Neurosurgery;  Laterality: Right;    Medications: Current Outpatient Medications  Medication Sig Dispense Refill   Abatacept (ORENCIA) 50 MG/0.4ML SOSY Inject into the skin.     acetaminophen (TYLENOL) 325 MG tablet Take 2 tablets (650 mg total) by mouth 3 (three) times daily. (Patient taking differently: Take 650 mg by mouth as needed.)     alendronate (FOSAMAX) 10 MG tablet Take 10 mg by mouth daily before breakfast. Take with a full glass of water on an empty stomach.     artificial tears (LACRILUBE) OINT ophthalmic ointment Place into  both eyes 2 (two) times daily.     aspirin EC 81 MG tablet Take 1 tablet (81 mg total) by mouth daily. Swallow whole. 30 tablet 12   calcium carbonate (TUMS - DOSED IN MG ELEMENTAL CALCIUM) 500 MG chewable tablet Chew 1 tablet (200 mg of elemental calcium total) by mouth daily with supper. (Patient taking differently: Chew 1 tablet by mouth as needed.)     Cholecalciferol (VITAMIN D3) 50 MCG (2000 UT) TABS Take 2,000 Units by mouth daily.     diazepam (VALIUM) 5 MG tablet Take 1 tablet (5 mg total) by mouth every 8 (eight) hours as needed for muscle spasms. (Patient not taking: Reported on 11/13/2022) 30 tablet 0   diclofenac Sodium (VOLTAREN) 1 % GEL Apply 2 g topically 4 (four) times daily. (Patient taking differently: Apply 2 g topically as needed.)     diphenhydrAMINE (SOMINEX) 25 MG tablet Take 25 mg by mouth at bedtime as needed for sleep.     folic acid (FOLVITE) 1 MG  tablet Take 1 mg by mouth daily.     methotrexate (RHEUMATREX) 2.5 MG tablet Take 20 mg by mouth once a week. Takes 8 tablets(20mg ) every Saturday.     Caution:Chemotherapy. Protect from light.     metoprolol succinate (TOPROL XL) 25 MG 24 hr tablet Take 1 tablet (25 mg total) by mouth daily. (Patient taking differently: Take 12.5 mg by mouth daily.) 90 tablet 3   Multiple Vitamins-Minerals (PRESERVISION AREDS 2 PO) Take 2 capsules by mouth daily.     Polyethyl Glycol-Propyl Glycol (SYSTANE OP) Apply to eye. 3 - 4 drops 4 times per day     predniSONE (DELTASONE) 10 MG tablet Take 10 mg by mouth every morning.     predniSONE (DELTASONE) 2.5 MG tablet      RITUXAN 500 MG/50ML injection See admin instructions. Every 6 months 2 dose infusion Ruxience     rosuvastatin (CRESTOR) 20 MG tablet 1 tablet Orally Once a day     scopolamine (TRANSDERM-SCOP) 1 MG/3DAYS Place 1 patch onto the skin every 3 (three) days.     scopolamine (TRANSDERM-SCOP) 1 MG/3DAYS 1 patch to skin behind the ear as needed Transdermal for 15 days     sertraline (ZOLOFT) 25 MG tablet Take 3 tablets (75 mg total) by mouth every morning. 90 tablet 0   sertraline (ZOLOFT) 50 MG tablet 1 tablet Orally Once a day for 90 days     spironolactone (ALDACTONE) 25 MG tablet 1/2 tablet Orally once a day     sulfaSALAzine (AZULFIDINE) 500 MG tablet Take 1,000 mg by mouth 2 (two) times daily.      traMADol (ULTRAM) 50 MG tablet Take 1 tablet (50 mg total) by mouth every 12 (twelve) hours as needed for moderate pain. 60 tablet 0   traZODone (DESYREL) 50 MG tablet TAKE 1.5 TABLETS (75 MG TOTAL) BY MOUTH AT BEDTIME AS NEEDED. FOR SLEEP 225 tablet 2   White Petrolatum-Mineral Oil (GENTEAL TEARS NIGHT-TIME OP) Apply to eye at bedtime.     No current facility-administered medications for this visit.    Allergies  Allergen Reactions   Hydroxychloroquine     Other Reaction(s): cardiolmyopathy    Diagnoses:  Major depression  Plan of Care:  Outpatient psychotherapy and medication management as needed. Initial Note: (With wife Christopher Yu). She says that she has hoped Assad would get into therapy. He has Ataxic Tremor. He told Dr. Arbutus Leas that he would like medication to just sit and do nothing.  He says he always had golf to rey on and now cannot play. In December, he went n for a surgery to remove an avm from brain. Was told it was a routine surgery and was told that he needs to get it out. Was told it would be a short recovery and he would be back to work. 1 1/2 hour surgery turned into 6 hour surgery and he had complications. He had extra cerebral fluid and Dr. York Spaniel it would absorb and just wait. He kept declining and by March, they went in to try and drain the extra fluid. He was in hospital for a week. Wife noticed that during the hospital stay, his speech declined and he was clearly weak. Nurses were trying to discharge him and she protested, but they sent him home anyway. He declined, and could not sit, stand, talk or walk and she ended up calling ambulance. In ER, she ws told he had a stroke. He was in ER 24 hours before getting a room in Neuro area. Head of stroke team came in and said it was not a stroke, but complications from surgery. She was blamed by medical team for not "speaking up" before being initially discharged. He ended up in a rehab for 2 weeks and told them he needed another surgery. Was told he had hydrocephalus and need a shunt to drain liquid off his brain. She felt a lot of pressure from team that he needed surgery. She wanted a second opinion and had rouble getting it. Spoke to another Dr. and they decided to get the third surgery. He was in OT,PT and Speech Therapy. During therapy, he started having a tremor. They ended up being referred to Dr. Arbutus Leas, who has now told him that treating his tremor is tricky and will take time.  Larin is no longer in OT,PT speech therapy. He is supposed to be doing exercises on his own, but has not  been emotionally able to ge himself to do it. He has persistent headaches as well. Just had a CAT Scan and awaiting the results. His headaches are worse and they are concerned. He also has rhumatory arthritis and it is flaring up because he had to stop meds. He has cardiomyopathy, macular degeneration on top of all of these other conditions.  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 He has been approved for disability and are working to get social security disability. She works part time. They have 2 daughters, one with diabetes and arthritis. She is heading to [employer] this Fall and wants to work in Research scientist (physical sciences). Other daughter is 46 and is "our introvert with lots of anxiety". She is in therapy and has mental health issues that is being treated. She will be a Holiday representative at the [employer] at [employer].  They are told that medicine will not control his condition, but they will have to try to retrain his brain. Suggested that they work to get back into OT and PT. She has a hard time motivating him. He admits that he keeps things "inside". She says he is not a good comunicator. He will not share things like if he is in pain or hurts himself.   Tx. Plan/Goals: Patient reports depressive symptoms secondary to the medical trauma he has suffered and the debilitating physical results. He is seeking therapy to resolve his feelings of helplessness and hopelessness. Also want assistance in improving his  drive/motivation in daily activity. Will engage in therapy that utilizes behavioral approaches to mitigate symptoms along with insight oriented counseling. Will also incorporate some conjoint sessions with spouse as needed, given her integral involvement with his care. Patient agrees with plan for treatment. Goal Date is 12-24    Session Note: He states that he has been getting headaches every afternoon. He sleeps to avoid the pain during the day and needs  Trazodone to sleep at night. We talked about his previous passion for golf, which has disappeared. He says he feels able to drive, but wife does not. He says he is appreciative of her efforts, but feels he needs to learn things on his own. He says the only thing getting him out of bed is walking to the refrig,, to the recliner and then back to bed. We talked about what might be behind his lack on interest in so many of the things he previously loved.  We talked about what he needs to get back some confidence in many areas. He feels that he can drive, but his wife is not yet confident. Is he willing to risk not being good? Doesn't want to bring his friends down, as he felt he did with me.     Garrel Ridgel, PhD 4:10p-5:00p 50 minutes

## 2023-01-09 ENCOUNTER — Ambulatory Visit (INDEPENDENT_AMBULATORY_CARE_PROVIDER_SITE_OTHER): Payer: BC Managed Care – PPO | Admitting: Psychology

## 2023-01-09 DIAGNOSIS — F321 Major depressive disorder, single episode, moderate: Secondary | ICD-10-CM | POA: Diagnosis not present

## 2023-01-09 NOTE — Progress Notes (Signed)
[facility] Counselor Initial Adult Exam  Name: Christopher Yu Date: 01/09/2023 MRN: [id number] DOB: [birth year] PCP: Thana Ates, MD    Guardian/Payee:  N/A    Paperwork requested: No   Reason for Visit Christopher Yu Problem: Adjustment to serious medical illness  Mental Status Exam: Appearance:   Casual     Behavior:  Appropriate  Motor:  Shuffling Gait  Speech/Language:   Slurred  Affect:  Depressed  Mood:  depressed  Thought process:  normal  Thought content:    WNL  Sensory/Perceptual disturbances:    WNL  Orientation:  oriented to person, place, and situation  Attention:  Good  Concentration:  Good  Memory:  WNL  Fund of knowledge:   Good  Insight:    Good  Judgment:   unknown  Impulse Control:  unknown    Reported Symptoms:  Sadness, helplessness and hopelessness  Risk Assessment: Danger to Self:  No Self-injurious Behavior: No Danger to Others: No Duty to Warn:no Physical Aggression / Violence:No  Access to Firearms a concern:  Unknown Gang Involvement:No  Patient / guardian was educated about steps to take if suicide or homicide risk level increases between visits: n/a While future psychiatric events cannot be accurately predicted, the patient does not currently require acute inpatient psychiatric care and does not currently meet [facility] involuntary commitment criteria.  Substance Abuse History: Current substance abuse: No     Past Psychiatric History:   No previous psychological problems have been observed Outpatient Providers:N/A History of Psych Hospitalization: No  Psychological Testing:  none    Abuse History:  Victim of:   N/A   Report needed: No. Victim of Neglect:No. Perpetrator of  N/A   Witness / Exposure to Domestic Violence: No   Protective Services Involvement: No  Witness to [employer] Violence:  No   Family  History:  Family History  Problem Relation Age of Onset   Other Father        MVA   Lupus Sister     Living situation: the patient lives with their family  Sexual Orientation: Straight  Relationship Status: married  Name of spouse [name] If a parent, number of children / ages:Two girls, ages 81 and 10  Support Systems: spouse  Surveyor, quantity Stress:  Yes   Income/Employment/Disability: Software engineer:  unknown  Educational History: Education:  unknown  Oncologist: unknown  Any cultural differences that may affect / interfere with treatment:  not applicable   Recreation/Hobbies: golfing  Stressors: Financial difficulties   Health problems    Strengths: Family  Barriers:  severe physical limitations that compromises activity   Legal History: Pending legal issue / charges: The patient has no significant history of legal issues. History of legal issue / charges:  N/A  Medical History/Surgical History: reviewed Past Medical History:  Diagnosis Date   Anxiety    Arthritis, rheumatoid (HCC)    dx 1988   CAD (coronary artery disease), native coronary artery    Mild LAD disease with calcification noted at cath 12//27/16    Cardiomyopathy (HCC)    Cardiomyopathy (HCC)    Chronic systolic heart failure (HCC) 05/01/2015   Depression    Deviated nasal septum 06/25/2011  Headache    High cholesterol    Hyperlipidemia    Impingement syndrome of left shoulder 12/22/2017   Macular degeneration    Nausea and vomiting 08/28/2022   Rheumatoid aortitis    Sleep apnea    does not use cpap - UNABLE TO TOLERATE MASK   Sleep apnea in adult    deviated septum repaired, most recent sleep study was negative   Status post total right knee replacement 06/01/2015    Past Surgical History:  Procedure Laterality Date   ANKLE FUSION Right 05/05/2012   related to his arthritis   ANKLE FUSION Left 05/25/2013   related to his arthritis    ANKLE SURGERY Bilateral    APPLICATION OF CRANIAL NAVIGATION N/A 04/11/2022   Procedure: APPLICATION OF CRANIAL NAVIGATION;  Surgeon: Lisbeth Renshaw, MD;  Location: [facility] OR;  Service: Neurosurgery;  Laterality: N/A;   APPLICATION OF CRANIAL NAVIGATION N/A 08/08/2022   Procedure: APPLICATION OF CRANIAL NAVIGATION;  Surgeon: Lisbeth Renshaw, MD;  Location: [facility] OR;  Service: Neurosurgery;  Laterality: N/A;   CARDIAC CATHETERIZATION N/A 05/01/2015   Procedure: Left Heart Cath and Coronary Angiography;  Surgeon: Lyn Records, MD;  Location: [facility] INVASIVE CV LAB;  Service: Cardiovascular;  Laterality: N/A;   CRANIOTOMY N/A 04/11/2022   Procedure: STEREOTACTIC SUBOCCIPITAL CRANIOTOMY FOR RESECTION OF ARTERIO-VENOUS MALFORMATION;  Surgeon: Lisbeth Renshaw, MD;  Location: [facility] OR;  Service: Neurosurgery;  Laterality: N/A;   FRACTURE SURGERY     left femur fracture x 3   IR ANGIO EXTERNAL CAROTID SEL EXT CAROTID BILAT MOD SED  01/28/2022   IR ANGIO INTRA EXTRACRAN SEL INTERNAL CAROTID BILAT MOD SED  01/28/2022   IR ANGIO INTRA EXTRACRAN SEL INTERNAL CAROTID BILAT MOD SED  04/16/2022   IR ANGIO VERTEBRAL SEL VERTEBRAL BILAT MOD SED  01/28/2022   IR ANGIO VERTEBRAL SEL VERTEBRAL BILAT MOD SED  04/16/2022   KNEE ARTHROSCOPY     right x 4   LAPAROSCOPIC REVISION VENTRICULAR-PERITONEAL (V-P) SHUNT N/A 08/08/2022   Procedure: LAPAROSCOPIC VENTRICULAR-PERITONEAL (V-P) SHUNT;  Surgeon: Harriette Bouillon, MD;  Location: [facility] OR;  Service: General;  Laterality: N/A;   LUMBAR LAMINECTOMY/DECOMPRESSION MICRODISCECTOMY N/A 07/18/2022   Procedure: Repair of Pseudomeningiocele posterior;  Surgeon: Lisbeth Renshaw, MD;  Location: [facility] OR;  Service: Neurosurgery;  Laterality: N/A;   NASAL SEPTOPLASTY W/ TURBINOPLASTY  06/25/2011   Procedure: NASAL SEPTOPLASTY WITH TURBINATE REDUCTION;  Surgeon: Osborn Coho, MD;  Location: [facility] OR;  Service: ENT;  Laterality: Bilateral;   PLACEMENT OF LUMBAR DRAIN N/A 07/18/2022    Procedure: PLACEMENT OF LUMBAR DRAIN;  Surgeon: Lisbeth Renshaw, MD;  Location: [facility] OR;  Service: Neurosurgery;  Laterality: N/A;   TONSILLECTOMY     TOTAL KNEE ARTHROPLASTY Right 06/01/2015   Procedure: RIGHT TOTAL KNEE ARTHROPLASTY;  Surgeon: Kathryne Hitch, MD;  Location: [facility] ORS;  Service: Orthopedics;  Laterality: Right;  Block+general   VENTRICULOPERITONEAL SHUNT Right 08/08/2022   Procedure: LAP ASSITED SHUNT INSERTION VENTRICULAR-PERITONEAL;  Surgeon: Lisbeth Renshaw, MD;  Location: [facility] OR;  Service: Neurosurgery;  Laterality: Right;    Medications: Current Outpatient Medications  Medication Sig Dispense Refill   Abatacept (ORENCIA) 50 MG/0.4ML SOSY Inject into the skin.     acetaminophen (TYLENOL) 325 MG tablet Take 2 tablets (650 mg total) by mouth 3 (three) times daily. (Patient taking differently: Take 650 mg by mouth as needed.)     alendronate (FOSAMAX) 10 MG tablet Take 10 mg by mouth daily before breakfast. Take with a full glass of water on  an empty stomach.     artificial tears (LACRILUBE) OINT ophthalmic ointment Place into both eyes 2 (two) times daily.     aspirin EC 81 MG tablet Take 1 tablet (81 mg total) by mouth daily. Swallow whole. 30 tablet 12   calcium carbonate (TUMS - DOSED IN MG ELEMENTAL CALCIUM) 500 MG chewable tablet Chew 1 tablet (200 mg of elemental calcium total) by mouth daily with supper. (Patient taking differently: Chew 1 tablet by mouth as needed.)     Cholecalciferol (VITAMIN D3) 50 MCG (2000 UT) TABS Take 2,000 Units by mouth daily.     diazepam (VALIUM) 5 MG tablet Take 1 tablet (5 mg total) by mouth every 8 (eight) hours as needed for muscle spasms. (Patient not taking: Reported on 11/13/2022) 30 tablet 0   diclofenac Sodium (VOLTAREN) 1 % GEL Apply 2 g topically 4 (four) times daily. (Patient taking differently: Apply 2 g topically as needed.)     diphenhydrAMINE (SOMINEX) 25 MG tablet Take 25 mg by mouth at bedtime as needed for sleep.      folic acid (FOLVITE) 1 MG tablet Take 1 mg by mouth daily.     methotrexate (RHEUMATREX) 2.5 MG tablet Take 20 mg by mouth once a week. Takes 8 tablets(20mg ) every Saturday.     Caution:Chemotherapy. Protect from light.     metoprolol succinate (TOPROL XL) 25 MG 24 hr tablet Take 1 tablet (25 mg total) by mouth daily. (Patient taking differently: Take 12.5 mg by mouth daily.) 90 tablet 3   Multiple Vitamins-Minerals (PRESERVISION AREDS 2 PO) Take 2 capsules by mouth daily.     Polyethyl Glycol-Propyl Glycol (SYSTANE OP) Apply to eye. 3 - 4 drops 4 times per day     predniSONE (DELTASONE) 10 MG tablet Take 10 mg by mouth every morning.     predniSONE (DELTASONE) 2.5 MG tablet      RITUXAN 500 MG/50ML injection See admin instructions. Every 6 months 2 dose infusion Ruxience     rosuvastatin (CRESTOR) 20 MG tablet 1 tablet Orally Once a day     scopolamine (TRANSDERM-SCOP) 1 MG/3DAYS Place 1 patch onto the skin every 3 (three) days.     scopolamine (TRANSDERM-SCOP) 1 MG/3DAYS 1 patch to skin behind the ear as needed Transdermal for 15 days     sertraline (ZOLOFT) 25 MG tablet Take 3 tablets (75 mg total) by mouth every morning. 90 tablet 0   sertraline (ZOLOFT) 50 MG tablet 1 tablet Orally Once a day for 90 days     spironolactone (ALDACTONE) 25 MG tablet 1/2 tablet Orally once a day     sulfaSALAzine (AZULFIDINE) 500 MG tablet Take 1,000 mg by mouth 2 (two) times daily.      traMADol (ULTRAM) 50 MG tablet Take 1 tablet (50 mg total) by mouth every 12 (twelve) hours as needed for moderate pain. 60 tablet 0   traZODone (DESYREL) 50 MG tablet TAKE 1.5 TABLETS (75 MG TOTAL) BY MOUTH AT BEDTIME AS NEEDED. FOR SLEEP 225 tablet 2   White Petrolatum-Mineral Oil (GENTEAL TEARS NIGHT-TIME OP) Apply to eye at bedtime.     No current facility-administered medications for this visit.    Allergies  Allergen Reactions   Hydroxychloroquine     Other Reaction(s): cardiolmyopathy    Diagnoses:  Major  depression  Plan of Care: Outpatient psychotherapy and medication management as needed. Initial Note: (With wife [name]). She says that she has hoped Christopher Yu would get into therapy. He has Ataxic Tremor.  He told Dr. Arbutus Leas that he would like medication to just sit and do nothing. He says he always had golf to rey on and now cannot play. In December, he went n for a surgery to remove an avm from brain. Was told it was a routine surgery and was told that he needs to get it out. Was told it would be a short recovery and he would be back to work. 1 1/2 hour surgery turned into 6 hour surgery and he had complications. He had extra cerebral fluid and Dr. York Spaniel it would absorb and just wait. He kept declining and by March, they went in to try and drain the extra fluid. He was in hospital for a week. Wife noticed that during the hospital stay, his speech declined and he was clearly weak. Nurses were trying to discharge him and she protested, but they sent him home anyway. He declined, and could not sit, stand, talk or walk and she ended up calling ambulance. In ER, she ws told he had a stroke. He was in ER 24 hours before getting a room in Neuro area. Head of stroke team came in and said it was not a stroke, but complications from surgery. She was blamed by medical team for not "speaking up" before being initially discharged. He ended up in a rehab for 2 weeks and told them he needed another surgery. Was told he had hydrocephalus and need a shunt to drain liquid off his brain. She felt a lot of pressure from team that he needed surgery. She wanted a second opinion and had rouble getting it. Spoke to another Dr. and they decided to get the third surgery. He was in OT,PT and Speech Therapy. During therapy, he started having a tremor. They ended up being referred to Dr. Arbutus Leas, who has now told him that treating his tremor is tricky and will take time.  Sanath is no longer in OT,PT speech therapy. He is supposed to be doing  exercises on his own, but has not been emotionally able to ge himself to do it. He has persistent headaches as well. Just had a CAT Scan and awaiting the results. His headaches are worse and they are concerned. He also has rhumatory arthritis and it is flaring up because he had to stop meds. He has cardiomyopathy, macular degeneration on top of all of these other conditions.  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 He has been approved for disability and are working to get social security disability. She works part time. They have 2 daughters, one with diabetes and arthritis. She is heading to [employer] this Fall and wants to work in Research scientist (physical sciences). Other daughter is 37 and is "our introvert with lots of anxiety". She is in therapy and has mental health issues that is being treated. She will be a Holiday representative at the [employer] at [employer].  They are told that medicine will not control his condition, but they will have to try to retrain his brain. Suggested that they work to get back into OT and PT. She has a hard time motivating him. He admits that he keeps things "inside". She says he is not a good comunicator. He will not share things like if he is in pain or hurts himself.   Tx. Plan/Goals: Patient reports depressive symptoms secondary to the medical trauma he has suffered and the debilitating physical results. He is seeking  therapy to resolve his feelings of helplessness and hopelessness. Also want assistance in improving his drive/motivation in daily activity. Will engage in therapy that utilizes behavioral approaches to mitigate symptoms along with insight oriented counseling. Will also incorporate some conjoint sessions with spouse as needed, given her integral involvement with his care. Patient agrees with plan for treatment. Goal Date is 12-24    Patient agrees to a video Public affairs consultant) session and is aware of the limitations. He is at home  and provider in his home office.  Session Note:  Patient states he has been spending some time on his exercise bike. He is thinking of joining his golf friends this weekend for the first time. He will go out with friends Saturday and might try hitting balls on Sunday. We talked about keeping himself structured each day with goals. Talked about having realistic expectations both in his reunion with friends on the course and when he goes to hit balls with daughter on Sunday. Marland Kitchen He says that he worries about the possibility of having another stroke. Told him to get the facts from his doctor before getting caught up in anxiety over this issue.        Garrel Ridgel, PhD 9:40a-10:30a 50 minutes

## 2023-01-14 ENCOUNTER — Ambulatory Visit (INDEPENDENT_AMBULATORY_CARE_PROVIDER_SITE_OTHER): Payer: BC Managed Care – PPO | Admitting: Psychology

## 2023-01-14 DIAGNOSIS — F321 Major depressive disorder, single episode, moderate: Secondary | ICD-10-CM | POA: Diagnosis not present

## 2023-01-14 NOTE — Progress Notes (Signed)
[facility] Counselor Initial Adult Exam  Name: Christopher Yu Date: 01/14/2023 MRN: [id number] DOB: 1960/08/16 PCP: Thana Ates, MD    Guardian/Payee:  N/A    Paperwork requested: No   Reason for Visit Christopher Yu Problem: Adjustment to serious medical illness  Mental Status Exam: Appearance:   Casual     Behavior:  Appropriate  Motor:  Shuffling Gait  Speech/Language:   Slurred  Affect:  Depressed  Mood:  depressed  Thought process:  normal  Thought content:    WNL  Sensory/Perceptual disturbances:    WNL  Orientation:  oriented to person, place, and situation  Attention:  Good  Concentration:  Good  Memory:  WNL  Fund of knowledge:   Good  Insight:    Good  Judgment:   unknown  Impulse Control:  unknown    Reported Symptoms:  Sadness, helplessness and hopelessness  Risk Assessment: Danger to Self:  No Self-injurious Behavior: No Danger to Others: No Duty to Warn:no Physical Aggression / Violence:No  Access to Firearms a concern:  Unknown Gang Involvement:No  Patient / guardian was educated about steps to take if suicide or homicide risk level increases between visits: n/a While future psychiatric events cannot be accurately predicted, the patient does not currently require acute inpatient psychiatric care and does not currently meet [facility] involuntary commitment criteria.  Substance Abuse History: Current substance abuse: No     Past Psychiatric History:   No previous psychological problems have been observed Outpatient Providers:N/A History of Psych Hospitalization: No  Psychological Testing:  none    Abuse History:  Victim of:   N/A   Report needed: No. Victim of Neglect:No. Perpetrator of  N/A   Witness / Exposure to Domestic Violence: No   Protective Services Involvement: No  Witness to  [employer] Violence:  No   Family History:  Family History  Problem Relation Age of Onset   Other Father        MVA   Lupus Sister     Living situation: the patient lives with their family  Sexual Orientation: Straight  Relationship Status: married  Name of spouse [name] If a parent, number of children / ages:Two girls, ages 79 and 49  Support Systems: spouse  Surveyor, quantity Stress:  Yes   Income/Employment/Disability: Software engineer:  unknown  Educational History: Education:  unknown  Oncologist: unknown  Any cultural differences that may affect / interfere with treatment:  not applicable   Recreation/Hobbies: golfing  Stressors: Financial difficulties   Health problems    Strengths: Family  Barriers:  severe physical limitations that compromises activity   Legal History: Pending legal issue / charges: The patient has no significant history of legal issues. History of legal issue / charges:  N/A  Medical History/Surgical History: reviewed Past Medical History:  Diagnosis Date   Anxiety    Arthritis, rheumatoid (HCC)    dx 1988   CAD (coronary artery disease), native coronary artery    Mild LAD disease with calcification noted at cath 12//27/16    Cardiomyopathy (HCC)    Cardiomyopathy (HCC)    Chronic systolic heart  failure (HCC) 05/01/2015   Depression    Deviated nasal septum 06/25/2011   Headache    High cholesterol    Hyperlipidemia    Impingement syndrome of left shoulder 12/22/2017   Macular degeneration    Nausea and vomiting 08/28/2022   Rheumatoid aortitis    Sleep apnea    does not use cpap - UNABLE TO TOLERATE MASK   Sleep apnea in adult    deviated septum repaired, most recent sleep study was negative   Status post total right knee replacement 06/01/2015    Past Surgical History:  Procedure Laterality Date   ANKLE FUSION Right 05/05/2012   related to his arthritis   ANKLE FUSION Left  05/25/2013   related to his arthritis   ANKLE SURGERY Bilateral    APPLICATION OF CRANIAL NAVIGATION N/A 04/11/2022   Procedure: APPLICATION OF CRANIAL NAVIGATION;  Surgeon: Lisbeth Renshaw, MD;  Location: [facility] OR;  Service: Neurosurgery;  Laterality: N/A;   APPLICATION OF CRANIAL NAVIGATION N/A 08/08/2022   Procedure: APPLICATION OF CRANIAL NAVIGATION;  Surgeon: Lisbeth Renshaw, MD;  Location: [facility] OR;  Service: Neurosurgery;  Laterality: N/A;   CARDIAC CATHETERIZATION N/A 05/01/2015   Procedure: Left Heart Cath and Coronary Angiography;  Surgeon: Lyn Records, MD;  Location: [facility] INVASIVE CV LAB;  Service: Cardiovascular;  Laterality: N/A;   CRANIOTOMY N/A 04/11/2022   Procedure: STEREOTACTIC SUBOCCIPITAL CRANIOTOMY FOR RESECTION OF ARTERIO-VENOUS MALFORMATION;  Surgeon: Lisbeth Renshaw, MD;  Location: [facility] OR;  Service: Neurosurgery;  Laterality: N/A;   FRACTURE SURGERY     left femur fracture x 3   IR ANGIO EXTERNAL CAROTID SEL EXT CAROTID BILAT MOD SED  01/28/2022   IR ANGIO INTRA EXTRACRAN SEL INTERNAL CAROTID BILAT MOD SED  01/28/2022   IR ANGIO INTRA EXTRACRAN SEL INTERNAL CAROTID BILAT MOD SED  04/16/2022   IR ANGIO VERTEBRAL SEL VERTEBRAL BILAT MOD SED  01/28/2022   IR ANGIO VERTEBRAL SEL VERTEBRAL BILAT MOD SED  04/16/2022   KNEE ARTHROSCOPY     right x 4   LAPAROSCOPIC REVISION VENTRICULAR-PERITONEAL (V-P) SHUNT N/A 08/08/2022   Procedure: LAPAROSCOPIC VENTRICULAR-PERITONEAL (V-P) SHUNT;  Surgeon: Harriette Bouillon, MD;  Location: [facility] OR;  Service: General;  Laterality: N/A;   LUMBAR LAMINECTOMY/DECOMPRESSION MICRODISCECTOMY N/A 07/18/2022   Procedure: Repair of Pseudomeningiocele posterior;  Surgeon: Lisbeth Renshaw, MD;  Location: [facility] OR;  Service: Neurosurgery;  Laterality: N/A;   NASAL SEPTOPLASTY W/ TURBINOPLASTY  06/25/2011   Procedure: NASAL SEPTOPLASTY WITH TURBINATE REDUCTION;  Surgeon: Osborn Coho, MD;  Location[facility] OR;  Service: ENT;  Laterality: Bilateral;    PLACEMENT OF LUMBAR DRAIN N/A 07/18/2022   Procedure: PLACEMENT OF LUMBAR DRAIN;  Surgeon: Lisbeth Renshaw, MD;  Location: [facility] OR;  Service: Neurosurgery;  Laterality: N/A;   TONSILLECTOMY     TOTAL KNEE ARTHROPLASTY Right 06/01/2015   Procedure: RIGHT TOTAL KNEE ARTHROPLASTY;  Surgeon: Kathryne Hitch, MD;  Location: [facility] ORS;  Service: Orthopedics;  Laterality: Right;  Block+general   VENTRICULOPERITONEAL SHUNT Right 08/08/2022   Procedure: LAP ASSITED SHUNT INSERTION VENTRICULAR-PERITONEAL;  Surgeon: Lisbeth Renshaw, MD;  Location: [facility] OR;  Service: Neurosurgery;  Laterality: Right;    Medications: Current Outpatient Medications  Medication Sig Dispense Refill   Abatacept (ORENCIA) 50 MG/0.4ML SOSY Inject into the skin.     acetaminophen (TYLENOL) 325 MG tablet Take 2 tablets (650 mg total) by mouth 3 (three) times daily. (Patient taking differently: Take 650 mg by mouth as needed.)     alendronate (FOSAMAX) 10 MG tablet Take  10 mg by mouth daily before breakfast. Take with a full glass of water on an empty stomach.     artificial tears (LACRILUBE) OINT ophthalmic ointment Place into both eyes 2 (two) times daily.     aspirin EC 81 MG tablet Take 1 tablet (81 mg total) by mouth daily. Swallow whole. 30 tablet 12   calcium carbonate (TUMS - DOSED IN MG ELEMENTAL CALCIUM) 500 MG chewable tablet Chew 1 tablet (200 mg of elemental calcium total) by mouth daily with supper. (Patient taking differently: Chew 1 tablet by mouth as needed.)     Cholecalciferol (VITAMIN D3) 50 MCG (2000 UT) TABS Take 2,000 Units by mouth daily.     diazepam (VALIUM) 5 MG tablet Take 1 tablet (5 mg total) by mouth every 8 (eight) hours as needed for muscle spasms. (Patient not taking: Reported on 11/13/2022) 30 tablet 0   diclofenac Sodium (VOLTAREN) 1 % GEL Apply 2 g topically 4 (four) times daily. (Patient taking differently: Apply 2 g topically as needed.)     diphenhydrAMINE (SOMINEX) 25 MG tablet Take 25 mg by  mouth at bedtime as needed for sleep.     folic acid (FOLVITE) 1 MG tablet Take 1 mg by mouth daily.     methotrexate (RHEUMATREX) 2.5 MG tablet Take 20 mg by mouth once a week. Takes 8 tablets(20mg ) every Saturday.     Caution:Chemotherapy. Protect from light.     metoprolol succinate (TOPROL XL) 25 MG 24 hr tablet Take 1 tablet (25 mg total) by mouth daily. (Patient taking differently: Take 12.5 mg by mouth daily.) 90 tablet 3   Multiple Vitamins-Minerals (PRESERVISION AREDS 2 PO) Take 2 capsules by mouth daily.     Polyethyl Glycol-Propyl Glycol (SYSTANE OP) Apply to eye. 3 - 4 drops 4 times per day     predniSONE (DELTASONE) 10 MG tablet Take 10 mg by mouth every morning.     predniSONE (DELTASONE) 2.5 MG tablet      RITUXAN 500 MG/50ML injection See admin instructions. Every 6 months 2 dose infusion Ruxience     rosuvastatin (CRESTOR) 20 MG tablet 1 tablet Orally Once a day     scopolamine (TRANSDERM-SCOP) 1 MG/3DAYS Place 1 patch onto the skin every 3 (three) days.     scopolamine (TRANSDERM-SCOP) 1 MG/3DAYS 1 patch to skin behind the ear as needed Transdermal for 15 days     sertraline (ZOLOFT) 25 MG tablet Take 3 tablets (75 mg total) by mouth every morning. 90 tablet 0   sertraline (ZOLOFT) 50 MG tablet 1 tablet Orally Once a day for 90 days     spironolactone (ALDACTONE) 25 MG tablet 1/2 tablet Orally once a day     sulfaSALAzine (AZULFIDINE) 500 MG tablet Take 1,000 mg by mouth 2 (two) times daily.      traMADol (ULTRAM) 50 MG tablet Take 1 tablet (50 mg total) by mouth every 12 (twelve) hours as needed for moderate pain. 60 tablet 0   traZODone (DESYREL) 50 MG tablet TAKE 1.5 TABLETS (75 MG TOTAL) BY MOUTH AT BEDTIME AS NEEDED. FOR SLEEP 225 tablet 2   White Petrolatum-Mineral Oil (GENTEAL TEARS NIGHT-TIME OP) Apply to eye at bedtime.     No current facility-administered medications for this visit.    Allergies  Allergen Reactions   Hydroxychloroquine     Other Reaction(s):  cardiolmyopathy    Diagnoses:  Major depression  Plan of Care: Outpatient psychotherapy and medication management as needed. Initial Note: (With wife [name]).  She says that she has hoped Christopher Yu would get into therapy. He has Ataxic Tremor. He told Dr. Arbutus Leas that he would like medication to just sit and do nothing. He says he always had golf to rey on and now cannot play. In December, he went n for a surgery to remove an avm from brain. Was told it was a routine surgery and was told that he needs to get it out. Was told it would be a short recovery and he would be back to work. 1 1/2 hour surgery turned into 6 hour surgery and he had complications. He had extra cerebral fluid and Dr. York Spaniel it would absorb and just wait. He kept declining and by March, they went in to try and drain the extra fluid. He was in hospital for a week. Wife noticed that during the hospital stay, his speech declined and he was clearly weak. Nurses were trying to discharge him and she protested, but they sent him home anyway. He declined, and could not sit, stand, talk or walk and she ended up calling ambulance. In ER, she ws told he had a stroke. He was in ER 24 hours before getting a room in Neuro area. Head of stroke team came in and said it was not a stroke, but complications from surgery. She was blamed by medical team for not "speaking up" before being initially discharged. He ended up in a rehab for 2 weeks and told them he needed another surgery. Was told he had hydrocephalus and need a shunt to drain liquid off his brain. She felt a lot of pressure from team that he needed surgery. She wanted a second opinion and had rouble getting it. Spoke to another Dr. and they decided to get the third surgery. He was in OT,PT and Speech Therapy. During therapy, he started having a tremor. They ended up being referred to Dr. Arbutus Leas, who has now told him that treating his tremor is tricky and will take time.  Christopher Yu is no longer in OT,PT speech  therapy. He is supposed to be doing exercises on his own, but has not been emotionally able to ge himself to do it. He has persistent headaches as well. Just had a CAT Scan and awaiting the results. His headaches are worse and they are concerned. He also has rhumatory arthritis and it is flaring up because he had to stop meds. He has cardiomyopathy, macular degeneration on top of all of these other conditions.  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 He has been approved for disability and are working to get social security disability. She works part time. They have 2 daughters, one with diabetes and arthritis. She is heading to [employer] this Fall and wants to work in Research scientist (physical sciences). Other daughter is 76 and is "our introvert with lots of anxiety". She is in therapy and has mental health issues that is being treated. She will be a Holiday representative at the [employer] at [employer].  They are told that medicine will not control his condition, but they will have to try to retrain his brain. Suggested that they work to get back into OT and PT. She has a hard time motivating him. He admits that he keeps things "inside". She says he is not a good comunicator. He will not share things like if he is in pain or hurts himself.   Tx. Plan/Goals: Patient reports depressive symptoms secondary  to the medical trauma he has suffered and the debilitating physical results. He is seeking therapy to resolve his feelings of helplessness and hopelessness. Also want assistance in improving his drive/motivation in daily activity. Will engage in therapy that utilizes behavioral approaches to mitigate symptoms along with insight oriented counseling. Will also incorporate some conjoint sessions with spouse as needed, given her integral involvement with his care. Patient agrees with plan for treatment. Goal Date is 12-24    Patient was seen in provider's office  Session Note:   Patient states that he did not go golfing with friends. Instead, one of his friends came over to his house and they had a nice talk/visit with each other. He is still interested in getting together with his friends to golf. He wants to start to practice golf in back yard sometime this week. He thinks it will be a mixed experience. Feels that his balance is "way off" and it will impact his ability to swing the club. He tends to get impatient and worries he will get frustrated. He has been so active most all of his life and now he isn't able to rely on his physical skills. He also had a very strong ability to recall specifics of things he observe.  He talked about an accident in his 22"s when he fell 30 and had multiple broken bones. We talked about his recovery and what he had to persevere to overcome that accident.           Garrel Ridgel, PhD 4:10p-5:00 50 minutes

## 2023-01-21 ENCOUNTER — Ambulatory Visit (INDEPENDENT_AMBULATORY_CARE_PROVIDER_SITE_OTHER): Payer: BC Managed Care – PPO | Admitting: Psychology

## 2023-01-21 DIAGNOSIS — F321 Major depressive disorder, single episode, moderate: Secondary | ICD-10-CM | POA: Diagnosis not present

## 2023-01-21 NOTE — Progress Notes (Signed)
[facility] Counselor Initial Adult Exam  Name: Christopher Yu Date: 01/21/2023 MRN: [id number] DOB: [birth year] PCP: Thana Ates, MD    Guardian/Payee:  N/A    Paperwork requested: No   Reason for Visit Christopher Yu Problem: Adjustment to serious medical illness  Mental Status Exam: Appearance:   Casual     Behavior:  Appropriate  Motor:  Shuffling Gait  Speech/Language:   Slurred  Affect:  Depressed  Mood:  depressed  Thought process:  normal  Thought content:    WNL  Sensory/Perceptual disturbances:    WNL  Orientation:  oriented to person, place, and situation  Attention:  Good  Concentration:  Good  Memory:  WNL  Fund of knowledge:   Good  Insight:    Good  Judgment:   unknown  Impulse Control:  unknown    Reported Symptoms:  Sadness, helplessness and hopelessness  Risk Assessment: Danger to Self:  No Self-injurious Behavior: No Danger to Others: No Duty to Warn:no Physical Aggression / Violence:No  Access to Firearms a concern:  Unknown Gang Involvement:No  Patient / guardian was educated about steps to take if suicide or homicide risk level increases between visits: n/a While future psychiatric events cannot be accurately predicted, the patient does not currently require acute inpatient psychiatric care and does not currently meet [facility] involuntary commitment criteria.  Substance Abuse History: Current substance abuse: No     Past Psychiatric History:   No previous psychological problems have been observed Outpatient Providers:N/A History of Psych Hospitalization: No  Psychological Testing:  none    Abuse History:  Victim of:   N/A   Report needed: No. Victim of Neglect:No. Perpetrator of  N/A   Witness / Exposure to Domestic Violence: No   Protective Services  Involvement: No  Witness to [employer] Violence:  No   Family History:  Family History  Problem Relation Age of Onset   Other Father        MVA   Lupus Sister     Living situation: the patient lives with their family  Sexual Orientation: Straight  Relationship Status: married  Name of spouse [name] If a parent, number of children / ages:Two girls, ages 72 and 43  Support Systems: spouse  Surveyor, quantity Stress:  Yes   Income/Employment/Disability: Software engineer:  unknown  Educational History: Education:  unknown  Oncologist: unknown  Any cultural differences that may affect / interfere with treatment:  not applicable   Recreation/Hobbies: golfing  Stressors: Financial difficulties   Health problems    Strengths: Family  Barriers:  severe physical limitations that compromises activity   Legal History: Pending legal issue / charges: The patient has no significant history of legal issues. History of legal issue / charges:  N/A  Medical History/Surgical History: reviewed Past Medical History:  Diagnosis Date   Anxiety    Arthritis, rheumatoid (HCC)    dx 1988   CAD (coronary artery disease), native coronary artery    Mild LAD disease with calcification noted at cath 12//27/16  Cardiomyopathy (HCC)    Cardiomyopathy (HCC)    Chronic systolic heart failure (HCC) 05/01/2015   Depression    Deviated nasal septum 06/25/2011   Headache    High cholesterol    Hyperlipidemia    Impingement syndrome of left shoulder 12/22/2017   Macular degeneration    Nausea and vomiting 08/28/2022   Rheumatoid aortitis    Sleep apnea    does not use cpap - UNABLE TO TOLERATE MASK   Sleep apnea in adult    deviated septum repaired, most recent sleep study was negative   Status post total right knee replacement 06/01/2015    Past Surgical History:  Procedure Laterality Date   ANKLE FUSION Right 05/05/2012   related to his arthritis    ANKLE FUSION Left 05/25/2013   related to his arthritis   ANKLE SURGERY Bilateral    APPLICATION OF CRANIAL NAVIGATION N/A 04/11/2022   Procedure: APPLICATION OF CRANIAL NAVIGATION;  Surgeon: Lisbeth Renshaw, MD;  Location: [facility] OR;  Service: Neurosurgery;  Laterality: N/A;   APPLICATION OF CRANIAL NAVIGATION N/A 08/08/2022   Procedure: APPLICATION OF CRANIAL NAVIGATION;  Surgeon: Lisbeth Renshaw, MD;  Location: [facility] OR;  Service: Neurosurgery;  Laterality: N/A;   CARDIAC CATHETERIZATION N/A 05/01/2015   Procedure: Left Heart Cath and Coronary Angiography;  Surgeon: Lyn Records, MD;  Location: [facility];  Service: Cardiovascular;  Laterality: N/A;   CRANIOTOMY N/A 04/11/2022   Procedure: STEREOTACTIC SUBOCCIPITAL CRANIOTOMY FOR RESECTION OF ARTERIO-VENOUS MALFORMATION;  Surgeon: Lisbeth Renshaw, MD;  Location: [facility] OR;  Service: Neurosurgery;  Laterality: N/A;   FRACTURE SURGERY     left femur fracture x 3   IR ANGIO EXTERNAL CAROTID SEL EXT CAROTID BILAT MOD SED  01/28/2022   IR ANGIO INTRA EXTRACRAN SEL INTERNAL CAROTID BILAT MOD SED  01/28/2022   IR ANGIO INTRA EXTRACRAN SEL INTERNAL CAROTID BILAT MOD SED  04/16/2022   IR ANGIO VERTEBRAL SEL VERTEBRAL BILAT MOD SED  01/28/2022   IR ANGIO VERTEBRAL SEL VERTEBRAL BILAT MOD SED  04/16/2022   KNEE ARTHROSCOPY     right x 4   LAPAROSCOPIC REVISION VENTRICULAR-PERITONEAL (V-P) SHUNT N/A 08/08/2022   Procedure: LAPAROSCOPIC VENTRICULAR-PERITONEAL (V-P) SHUNT;  Surgeon: Harriette Bouillon, MD;  Location: [facility] OR;  Service: General;  Laterality: N/A;   LUMBAR LAMINECTOMY/DECOMPRESSION MICRODISCECTOMY N/A 07/18/2022   Procedure: Repair of Pseudomeningiocele posterior;  Surgeon: Lisbeth Renshaw, MD;  Location: [facility] OR;  Service: Neurosurgery;  Laterality: N/A;   NASAL SEPTOPLASTY W/ TURBINOPLASTY  06/25/2011   Procedure: NASAL SEPTOPLASTY WITH TURBINATE REDUCTION;  Surgeon: Osborn Coho, MD;  Location: [facility] OR;  Service: ENT;  Laterality:  Bilateral;   PLACEMENT OF LUMBAR DRAIN N/A 07/18/2022   Procedure: PLACEMENT OF LUMBAR DRAIN;  Surgeon: Lisbeth Renshaw, MD;  Location: [facility] OR;  Service: Neurosurgery;  Laterality: N/A;   TONSILLECTOMY     TOTAL KNEE ARTHROPLASTY Right 06/01/2015   Procedure: RIGHT TOTAL KNEE ARTHROPLASTY;  Surgeon: Kathryne Hitch, MD;  Location: [facility] ORS;  Service: Orthopedics;  Laterality: Right;  Block+general   VENTRICULOPERITONEAL SHUNT Right 08/08/2022   Procedure: LAP ASSITED SHUNT INSERTION VENTRICULAR-PERITONEAL;  Surgeon: Lisbeth Renshaw, MD;  Location: [facility] OR;  Service: Neurosurgery;  Laterality: Right;    Medications: Current Outpatient Medications  Medication Sig Dispense Refill   Abatacept (ORENCIA) 50 MG/0.4ML SOSY Inject into the skin.     acetaminophen (TYLENOL) 325 MG tablet Take 2 tablets (650 mg total) by mouth 3 (three) times daily. (Patient taking differently: Take 650 mg by  mouth as needed.)     alendronate (FOSAMAX) 10 MG tablet Take 10 mg by mouth daily before breakfast. Take with a full glass of water on an empty stomach.     artificial tears (LACRILUBE) OINT ophthalmic ointment Place into both eyes 2 (two) times daily.     aspirin EC 81 MG tablet Take 1 tablet (81 mg total) by mouth daily. Swallow whole. 30 tablet 12   calcium carbonate (TUMS - DOSED IN MG ELEMENTAL CALCIUM) 500 MG chewable tablet Chew 1 tablet (200 mg of elemental calcium total) by mouth daily with supper. (Patient taking differently: Chew 1 tablet by mouth as needed.)     Cholecalciferol (VITAMIN D3) 50 MCG (2000 UT) TABS Take 2,000 Units by mouth daily.     diazepam (VALIUM) 5 MG tablet Take 1 tablet (5 mg total) by mouth every 8 (eight) hours as needed for muscle spasms. (Patient not taking: Reported on 11/13/2022) 30 tablet 0   diclofenac Sodium (VOLTAREN) 1 % GEL Apply 2 g topically 4 (four) times daily. (Patient taking differently: Apply 2 g topically as needed.)     diphenhydrAMINE (SOMINEX) 25 MG tablet  Take 25 mg by mouth at bedtime as needed for sleep.     folic acid (FOLVITE) 1 MG tablet Take 1 mg by mouth daily.     methotrexate (RHEUMATREX) 2.5 MG tablet Take 20 mg by mouth once a week. Takes 8 tablets(20mg ) every Saturday.     Caution:Chemotherapy. Protect from light.     metoprolol succinate (TOPROL XL) 25 MG 24 hr tablet Take 1 tablet (25 mg total) by mouth daily. (Patient taking differently: Take 12.5 mg by mouth daily.) 90 tablet 3   Multiple Vitamins-Minerals (PRESERVISION AREDS 2 PO) Take 2 capsules by mouth daily.     Polyethyl Glycol-Propyl Glycol (SYSTANE OP) Apply to eye. 3 - 4 drops 4 times per day     predniSONE (DELTASONE) 10 MG tablet Take 10 mg by mouth every morning.     predniSONE (DELTASONE) 2.5 MG tablet      RITUXAN 500 MG/50ML injection See admin instructions. Every 6 months 2 dose infusion Ruxience     rosuvastatin (CRESTOR) 20 MG tablet 1 tablet Orally Once a day     scopolamine (TRANSDERM-SCOP) 1 MG/3DAYS Place 1 patch onto the skin every 3 (three) days.     scopolamine (TRANSDERM-SCOP) 1 MG/3DAYS 1 patch to skin behind the ear as needed Transdermal for 15 days     sertraline (ZOLOFT) 25 MG tablet Take 3 tablets (75 mg total) by mouth every morning. 90 tablet 0   sertraline (ZOLOFT) 50 MG tablet 1 tablet Orally Once a day for 90 days     spironolactone (ALDACTONE) 25 MG tablet 1/2 tablet Orally once a day     sulfaSALAzine (AZULFIDINE) 500 MG tablet Take 1,000 mg by mouth 2 (two) times daily.      traMADol (ULTRAM) 50 MG tablet Take 1 tablet (50 mg total) by mouth every 12 (twelve) hours as needed for moderate pain. 60 tablet 0   traZODone (DESYREL) 50 MG tablet TAKE 1.5 TABLETS (75 MG TOTAL) BY MOUTH AT BEDTIME AS NEEDED. FOR SLEEP 225 tablet 2   White Petrolatum-Mineral Oil (GENTEAL TEARS NIGHT-TIME OP) Apply to eye at bedtime.     No current facility-administered medications for this visit.    Allergies  Allergen Reactions   Hydroxychloroquine     Other  Reaction(s): cardiolmyopathy    Diagnoses:  Major depression  Plan of  Care: Outpatient psychotherapy and medication management as needed. Initial Note: (With wife [name]). She says that she has hoped Jean would get into therapy. He has Ataxic Tremor. He told Dr. Arbutus Leas that he would like medication to just sit and do nothing. He says he always had golf to rey on and now cannot play. In December, he went n for a surgery to remove an avm from brain. Was told it was a routine surgery and was told that he needs to get it out. Was told it would be a short recovery and he would be back to work. 1 1/2 hour surgery turned into 6 hour surgery and he had complications. He had extra cerebral fluid and Dr. York Spaniel it would absorb and just wait. He kept declining and by March, they went in to try and drain the extra fluid. He was in hospital for a week. Wife noticed that during the hospital stay, his speech declined and he was clearly weak. Nurses were trying to discharge him and she protested, but they sent him home anyway. He declined, and could not sit, stand, talk or walk and she ended up calling ambulance. In ER, she ws told he had a stroke. He was in ER 24 hours before getting a room in Neuro area. Head of stroke team came in and said it was not a stroke, but complications from surgery. She was blamed by medical team for not "speaking up" before being initially discharged. He ended up in a rehab for 2 weeks and told them he needed another surgery. Was told he had hydrocephalus and need a shunt to drain liquid off his brain. She felt a lot of pressure from team that he needed surgery. She wanted a second opinion and had rouble getting it. Spoke to another Dr. and they decided to get the third surgery. He was in OT,PT and Speech Therapy. During therapy, he started having a tremor. They ended up being referred to Dr. Arbutus Leas, who has now told him that treating his tremor is tricky and will take time.  [place] is no longer in  OT,PT speech therapy. He is supposed to be doing exercises on his own, but has not been emotionally able to ge himself to do it. He has persistent headaches as well. Just had a CAT Scan and awaiting the results. His headaches are worse and they are concerned. He also has rhumatory arthritis and it is flaring up because he had to stop meds. He has cardiomyopathy, macular degeneration on top of all of these other conditions.  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 He has been approved for disability and are working to get social security disability. She works part time. They have 2 daughters, one with diabetes and arthritis. She is heading to [employer] this Fall and wants to work in Research scientist (physical sciences). Other daughter is 9 and is "our introvert with lots of anxiety". She is in therapy and has mental health issues that is being treated. She will be a Holiday representative at the [employer] at [employer].  They are told that medicine will not control his condition, but they will have to try to retrain his brain. Suggested that they work to get back into OT and PT. She has a hard time motivating him. He admits that he keeps things "inside". She says he is not a good comunicator. He will not share things like if he is in  pain or hurts himself.   Tx. Plan/Goals: Patient reports depressive symptoms secondary to the medical trauma he has suffered and the debilitating physical results. He is seeking therapy to resolve his feelings of helplessness and hopelessness. Also want assistance in improving his drive/motivation in daily activity. Will engage in therapy that utilizes behavioral approaches to mitigate symptoms along with insight oriented counseling. Will also incorporate some conjoint sessions with spouse as needed, given her integral involvement with his care. Patient agrees with plan for treatment. Goal Date is 12-24    Patient agrees to a video session  (caregility) and understands the limitations of the platform. He is at home and provider in the office. Session Note:  Patient states that he had problems with his knee and needed a walker all weekend. We talked about remaining positive during the times when he has something physical happen that interferes with his plans/expectations. He finds it difficult to avoid depressive feelings. He has a fear that his wife or daughter would come home to find him on the floor. He, therefore, does his best to be safe while they are gone. He states that he hopes to get out to driving range over the weekend and hit balls with wife and daughter.             Garrel Ridgel, PhD 4:10p-5:00 50 minutes

## 2023-01-27 ENCOUNTER — Other Ambulatory Visit: Payer: Self-pay | Admitting: Interventional Cardiology

## 2023-01-28 ENCOUNTER — Ambulatory Visit: Payer: BC Managed Care – PPO | Admitting: Psychology

## 2023-01-28 NOTE — Progress Notes (Unsigned)
[facility] Counselor Initial Adult Exam  Name: Christopher Yu Date: 01/28/2023 MRN: [id number] DOB: 1961-01-28 PCP: Thana Ates, MD    Guardian/Payee:  N/A    Paperwork requested: No   Reason for Visit [name] Problem: Adjustment to serious medical illness  Mental Status Exam: Appearance:   Casual     Behavior:  Appropriate  Motor:  Shuffling Gait  Speech/Language:   Slurred  Affect:  Depressed  Mood:  depressed  Thought process:  normal  Thought content:    WNL  Sensory/Perceptual disturbances:    WNL  Orientation:  oriented to person, place, and situation  Attention:  Good  Concentration:  Good  Memory:  WNL  Fund of knowledge:   Good  Insight:    Good  Judgment:   unknown  Impulse Control:  unknown    Reported Symptoms:  Sadness, helplessness and hopelessness  Risk Assessment: Danger to Self:  No Self-injurious Behavior: No Danger to Others: No Duty to Warn:no Physical Aggression / Violence:No  Access to Firearms a concern:  Unknown Gang Involvement:No  Patient / guardian was educated about steps to take if suicide or homicide risk level increases between visits: n/a While future psychiatric events cannot be accurately predicted, the patient does not currently require acute inpatient psychiatric care and does not currently meet [facility] involuntary commitment criteria.  Substance Abuse History: Current substance abuse: No     Past Psychiatric History:   No previous psychological problems have been observed Outpatient Providers:N/A History of Psych Hospitalization: No  Psychological Testing:  none    Abuse History:  Victim of:   N/A   Report needed: No. Victim of Neglect:No. Perpetrator of  N/A   Witness / Exposure to Domestic Violence:  No   Protective Services Involvement: No  Witness to [employer] Violence:  No   Family History:  Family History  Problem Relation Age of Onset   Other Father        MVA   Lupus Sister     Living situation: the patient lives with their family  Sexual Orientation: Straight  Relationship Status: married  Name of spouse [name] If a parent, number of children / ages:Two girls, ages 8 and 51  Support Systems: spouse  Surveyor, quantity Stress:  Yes   Income/Employment/Disability: Software engineer:  unknown  Educational History: Education:  unknown  Oncologist: unknown  Any cultural differences that may affect / interfere with treatment:  not applicable   Recreation/Hobbies: golfing  Stressors: Financial difficulties   Health problems    Strengths: Family  Barriers:  severe physical limitations that compromises activity   Legal History: Pending legal issue / charges: The patient has no significant history of legal issues. History of legal issue / charges:  N/A  Medical History/Surgical History: reviewed Past Medical History:  Diagnosis Date   Anxiety    Arthritis, rheumatoid (HCC)    dx 1988   CAD (coronary artery disease), native  coronary artery    Mild LAD disease with calcification noted at cath 12//27/16    Cardiomyopathy (HCC)    Cardiomyopathy (HCC)    Chronic systolic heart failure (HCC) 05/01/2015   Depression    Deviated nasal septum 06/25/2011   Headache    High cholesterol    Hyperlipidemia    Impingement syndrome of left shoulder 12/22/2017   Macular degeneration    Nausea and vomiting 08/28/2022   Rheumatoid aortitis    Sleep apnea    does not use cpap - UNABLE TO TOLERATE MASK   Sleep apnea in adult    deviated septum repaired, most recent sleep study was negative   Status post total right knee replacement 06/01/2015    Past Surgical History:  Procedure Laterality Date   ANKLE FUSION Right 05/05/2012    related to his arthritis   ANKLE FUSION Left 05/25/2013   related to his arthritis   ANKLE SURGERY Bilateral    APPLICATION OF CRANIAL NAVIGATION N/A 04/11/2022   Procedure: APPLICATION OF CRANIAL NAVIGATION;  Surgeon: Lisbeth Renshaw, MD;  Location: [facility] OR;  Service: Neurosurgery;  Laterality: N/A;   APPLICATION OF CRANIAL NAVIGATION N/A 08/08/2022   Procedure: APPLICATION OF CRANIAL NAVIGATION;  Surgeon: Lisbeth Renshaw, MD;  Location: [facility] OR;  Service: Neurosurgery;  Laterality: N/A;   CARDIAC CATHETERIZATION N/A 05/01/2015   Procedure: Left Heart Cath and Coronary Angiography;  Surgeon: Lyn Records, MD;  Location: [facility] INVASIVE CV LAB;  Service: Cardiovascular;  Laterality: N/A;   CRANIOTOMY N/A 04/11/2022   Procedure: STEREOTACTIC SUBOCCIPITAL CRANIOTOMY FOR RESECTION OF ARTERIO-VENOUS MALFORMATION;  Surgeon: Lisbeth Renshaw, MD;  Location: [facility] OR;  Service: Neurosurgery;  Laterality: N/A;   FRACTURE SURGERY     left femur fracture x 3   IR ANGIO EXTERNAL CAROTID SEL EXT CAROTID BILAT MOD SED  01/28/2022   IR ANGIO INTRA EXTRACRAN SEL INTERNAL CAROTID BILAT MOD SED  01/28/2022   IR ANGIO INTRA EXTRACRAN SEL INTERNAL CAROTID BILAT MOD SED  04/16/2022   IR ANGIO VERTEBRAL SEL VERTEBRAL BILAT MOD SED  01/28/2022   IR ANGIO VERTEBRAL SEL VERTEBRAL BILAT MOD SED  04/16/2022   KNEE ARTHROSCOPY     right x 4   LAPAROSCOPIC REVISION VENTRICULAR-PERITONEAL (V-P) SHUNT N/A 08/08/2022   Procedure: LAPAROSCOPIC VENTRICULAR-PERITONEAL (V-P) SHUNT;  Surgeon: Harriette Bouillon, MD;  Location: [facility] OR;  Service: General;  Laterality: N/A;   LUMBAR LAMINECTOMY/DECOMPRESSION MICRODISCECTOMY N/A 07/18/2022   Procedure: Repair of Pseudomeningiocele posterior;  Surgeon: Lisbeth Renshaw, MD;  Location: [facility];  Service: Neurosurgery;  Laterality: N/A;   NASAL SEPTOPLASTY W/ TURBINOPLASTY  06/25/2011   Procedure: NASAL SEPTOPLASTY WITH TURBINATE REDUCTION;  Surgeon: Osborn Coho, MD;  Location[facility] OR;   Service: ENT;  Laterality: Bilateral;   PLACEMENT OF LUMBAR DRAIN N/A 07/18/2022   Procedure: PLACEMENT OF LUMBAR DRAIN;  Surgeon: Lisbeth Renshaw, MD;  Location: [facility] OR;  Service: Neurosurgery;  Laterality: N/A;   TONSILLECTOMY     TOTAL KNEE ARTHROPLASTY Right 06/01/2015   Procedure: RIGHT TOTAL KNEE ARTHROPLASTY;  Surgeon: Kathryne Hitch, MD;  Location: [facility] ORS;  Service: Orthopedics;  Laterality: Right;  Block+general   VENTRICULOPERITONEAL SHUNT Right 08/08/2022   Procedure: LAP ASSITED SHUNT INSERTION VENTRICULAR-PERITONEAL;  Surgeon: Lisbeth Renshaw, MD;  Location: [facility] OR;  Service: Neurosurgery;  Laterality: Right;    Medications: Current Outpatient Medications  Medication Sig Dispense Refill   Abatacept (ORENCIA) 50 MG/0.4ML SOSY Inject into the skin.     acetaminophen (TYLENOL) 325 MG tablet Take 2  tablets (650 mg total) by mouth 3 (three) times daily. (Patient taking differently: Take 650 mg by mouth as needed.)     alendronate (FOSAMAX) 10 MG tablet Take 10 mg by mouth daily before breakfast. Take with a full glass of water on an empty stomach.     artificial tears (LACRILUBE) OINT ophthalmic ointment Place into both eyes 2 (two) times daily.     aspirin EC 81 MG tablet Take 1 tablet (81 mg total) by mouth daily. Swallow whole. 30 tablet 12   calcium carbonate (TUMS - DOSED IN MG ELEMENTAL CALCIUM) 500 MG chewable tablet Chew 1 tablet (200 mg of elemental calcium total) by mouth daily with supper. (Patient taking differently: Chew 1 tablet by mouth as needed.)     Cholecalciferol (VITAMIN D3) 50 MCG (2000 UT) TABS Take 2,000 Units by mouth daily.     diazepam (VALIUM) 5 MG tablet Take 1 tablet (5 mg total) by mouth every 8 (eight) hours as needed for muscle spasms. (Patient not taking: Reported on 11/13/2022) 30 tablet 0   diclofenac Sodium (VOLTAREN) 1 % GEL Apply 2 g topically 4 (four) times daily. (Patient taking differently: Apply 2 g topically as needed.)      diphenhydrAMINE (SOMINEX) 25 MG tablet Take 25 mg by mouth at bedtime as needed for sleep.     folic acid (FOLVITE) 1 MG tablet Take 1 mg by mouth daily.     methotrexate (RHEUMATREX) 2.5 MG tablet Take 20 mg by mouth once a week. Takes 8 tablets(20mg ) every Saturday.     Caution:Chemotherapy. Protect from light.     metoprolol succinate (TOPROL XL) 25 MG 24 hr tablet Take 1 tablet (25 mg total) by mouth daily. (Patient taking differently: Take 12.5 mg by mouth daily.) 90 tablet 3   Multiple Vitamins-Minerals (PRESERVISION AREDS 2 PO) Take 2 capsules by mouth daily.     Polyethyl Glycol-Propyl Glycol (SYSTANE OP) Apply to eye. 3 - 4 drops 4 times per day     predniSONE (DELTASONE) 10 MG tablet Take 10 mg by mouth every morning.     predniSONE (DELTASONE) 2.5 MG tablet      RITUXAN 500 MG/50ML injection See admin instructions. Every 6 months 2 dose infusion Ruxience     rosuvastatin (CRESTOR) 20 MG tablet TAKE 1 TABLET DAILY (DOSE INCREASE) 90 tablet 2   scopolamine (TRANSDERM-SCOP) 1 MG/3DAYS Place 1 patch onto the skin every 3 (three) days.     scopolamine (TRANSDERM-SCOP) 1 MG/3DAYS 1 patch to skin behind the ear as needed Transdermal for 15 days     sertraline (ZOLOFT) 25 MG tablet Take 3 tablets (75 mg total) by mouth every morning. 90 tablet 0   sertraline (ZOLOFT) 50 MG tablet 1 tablet Orally Once a day for 90 days     spironolactone (ALDACTONE) 25 MG tablet 1/2 tablet Orally once a day     sulfaSALAzine (AZULFIDINE) 500 MG tablet Take 1,000 mg by mouth 2 (two) times daily.      traMADol (ULTRAM) 50 MG tablet Take 1 tablet (50 mg total) by mouth every 12 (twelve) hours as needed for moderate pain. 60 tablet 0   traZODone (DESYREL) 50 MG tablet TAKE 1.5 TABLETS (75 MG TOTAL) BY MOUTH AT BEDTIME AS NEEDED. FOR SLEEP 225 tablet 2   White Petrolatum-Mineral Oil (GENTEAL TEARS NIGHT-TIME OP) Apply to eye at bedtime.     No current facility-administered medications for this visit.     Allergies  Allergen Reactions  Hydroxychloroquine     Other Reaction(s): cardiolmyopathy    Diagnoses:  Major depression  Plan of Care: Outpatient psychotherapy and medication management as needed. Initial Note: (With wife [name]). She says that she has hoped Myrick would get into therapy. He has Ataxic Tremor. He told Dr. Arbutus Leas that he would like medication to just sit and do nothing. He says he always had golf to rey on and now cannot play. In December, he went n for a surgery to remove an avm from brain. Was told it was a routine surgery and was told that he needs to get it out. Was told it would be a short recovery and he would be back to work. 1 1/2 hour surgery turned into 6 hour surgery and he had complications. He had extra cerebral fluid and Dr. York Spaniel it would absorb and just wait. He kept declining and by March, they went in to try and drain the extra fluid. He was in hospital for a week. Wife noticed that during the hospital stay, his speech declined and he was clearly weak. Nurses were trying to discharge him and she protested, but they sent him home anyway. He declined, and could not sit, stand, talk or walk and she ended up calling ambulance. In ER, she ws told he had a stroke. He was in ER 24 hours before getting a room in Neuro area. Head of stroke team came in and said it was not a stroke, but complications from surgery. She was blamed by medical team for not "speaking up" before being initially discharged. He ended up in a rehab for 2 weeks and told them he needed another surgery. Was told he had hydrocephalus and need a shunt to drain liquid off his brain. She felt a lot of pressure from team that he needed surgery. She wanted a second opinion and had rouble getting it. Spoke to another Dr. and they decided to get the third surgery. He was in OT,PT and Speech Therapy. During therapy, he started having a tremor. They ended up being referred to Dr. Arbutus Leas, who has now told him that treating  his tremor is tricky and will take time.  [place] is no longer in OT,PT speech therapy. He is supposed to be doing exercises on his own, but has not been emotionally able to ge himself to do it. He has persistent headaches as well. Just had a CAT Scan and awaiting the results. His headaches are worse and they are concerned. He also has rhumatory arthritis and it is flaring up because he had to stop meds. He has cardiomyopathy, macular degeneration on top of all of these other conditions.  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 He has been approved for disability and are working to get social security disability. She works part time. They have 2 daughters, one with diabetes and arthritis. She is heading to [employer] this Fall and wants to work in Research scientist (physical sciences). Other daughter is 44 and is "our introvert with lots of anxiety". She is in therapy and has mental health issues that is being treated. She will be a Holiday representative at the [employer] at [employer].  They are told that medicine will not control his condition, but they will have to try to retrain his brain. Suggested that they work to get back into OT and PT. She has a hard time motivating him. He admits that he keeps things "inside".  She says he is not a good comunicator. He will not share things like if he is in pain or hurts himself.   Tx. Plan/Goals: Patient reports depressive symptoms secondary to the medical trauma he has suffered and the debilitating physical results. He is seeking therapy to resolve his feelings of helplessness and hopelessness. Also want assistance in improving his drive/motivation in daily activity. Will engage in therapy that utilizes behavioral approaches to mitigate symptoms along with insight oriented counseling. Will also incorporate some conjoint sessions with spouse as needed, given her integral involvement with his care. Patient agrees with plan for treatment.  Goal Date is 12-24    Patient agrees to a video session (caregility) and understands the limitations of the platform. He is at home and provider in the office. Session Note:  Patient              Garrel Ridgel, PhD 4:10p-5:00 50 minutes

## 2023-02-01 NOTE — Progress Notes (Addendum)
Christopher Yu is a 62 y.o. year old male  who  has a past medical history of Anxiety, Arthritis, rheumatoid (HCC), CAD (coronary artery disease), native coronary artery, Cardiomyopathy (HCC), Cardiomyopathy (HCC), Chronic systolic heart failure (HCC) (53/66/4403), Depression, Deviated nasal septum (06/25/2011), Headache, High cholesterol, Hyperlipidemia, Impingement syndrome of left shoulder (12/22/2017), Macular degeneration, Nausea and vomiting (08/28/2022), Rheumatoid aortitis, Sleep apnea, Sleep apnea in adult, and Status post total right knee replacement (06/01/2015).   They are presenting to PM&R clinic for follow up related to  cerebellar AVM s/p resection 04/2022 -> c/b pseudomeningocele s/p repair 07/18/22-> hospitalization and IPR admission 3/25-4/23 for secondary cerebellar stroke and hydrocephalus s/p VP shunt 08/08/22 with Dr. Conchita Paris .  Plan from last visit:  Cerebellar stroke [facility]) Status post craniotomy Continue with therapies for now, continue energy conservation and use of rolling walker with significant exertion; okay to continue quad cane for short distances, as gait appears stable with this.   Continuing following up with Dr. Conchita Paris.  Incisional site looks good today, and Benadryl p.o. is causing increased lethargy during the daytime, therefore switch to topical Benadryl over-the-counter to the incision site as needed for itching and stop oral Benadryl.     Follow-up in 3 months.   Rheumatoid arthritis of other site, unspecified whether rheumatoid factor present [facility])   Follow-up with your rheumatologist for infusions in July; be aware that steroids can interfere with sleep quality and may be a factor in worsening insomnia   Refilled patient's tramadol 50 mg twice daily as needed, generally gets through Dr. Margaretann Loveless PCP, will message him to let him know of this in anticipation of next visit.  Patient is going out of town soon, therefore fill prescription today was  appropriate.  Stop oxycodone.   Insomnia, unspecified type Anxiety and depression   Discussed risks and benefits of increasing versus adding medication, along with significant risk of serotonin syndrome with sertraline, tramadol, and trazodone.  Patient opted to increase trazodone today, sent prescription for 75 mg nightly as needed.  Will stop medication if any symptoms of serotonin syndrome and inform us immediately.   Did urged patient to get a repeat sleep study in the presence of history of OSA, and wife endorsing snoring at nighttime.  Defer follow-up to PCP.   Will defer increasing sertraline for anxiety/depression with increase trazodone as above.   Interval Hx:  - Therapies: Finished SLP, PT, OT - patient says he does not want to go because of driving; he says "OT is a waste of my time, PT I can do on my own, but SLP is different." Not currently doing HEP.    - Follow ups: Saw Dr. 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 Right frontal VP shunt in place. Ventricular size is stable. 2. Previous suboccipital craniectomy. Pseudomeningocele at the craniectomy site is unchanged. Fluid collection posterior to the left cerebellum is unchanged. Fluid collection between the folia of the right cerebellar hemisphere is smaller, decreased from 29 x 15 mm to a size of 28 x 11 mm.  Sleep study? - did not end up getting, he has been wanting to get an apple watch to check for nighttime desaturations. He still has a lot of snoring at nighttime and complaining of low energy levels during the day.   Has appointment with Dr. Conchita Paris in 2 weeks, sees every 3 months.    - Falls: none   -  DME: Uses a single point cane usually, uses a walker when his arthritis flares up. At home he furniture walks.    - Medications: Tramadol 50 mg #60 tabs filled 10/20/22 - "it helps, but it is take as required", hasn't used  recently but usually uses for headaches.   Gabapentin 300 mg BID by Dr. Margaretann Loveless started 9/16 - "that was a mistake", he has not been taking it but historically it has helped and without side effects.    - Other concerns:  Ongoing dizziness - none today; "at home I will take a breather once I stand up so I can get my bearings on where I'm going. Plus I have a large dog that likes to be by my feet, so I have to navigate around him".   Headaches - Bilateral, changes areas, weekly, no triggers that he knows of. Tramadol takes the edge off but does not resolve it completely.   RA - states his flares are getting more frequent and his pain overall is worse. He did get cleared for Rituximab per neurosurgery, but rheumatologist will not give due to risk of shunt infection. Looking into Enbrel, restarting Arencia but are not seeing benefits currently. Currently on prednisone taper for flare in left arm.   Pain Inventory Average Pain 5 Pain Right Now 5 My pain is aching  LOCATION OF PAIN  varies  BOWEL Number of stools per week: 7 Oral laxative use No  Type of laxative . Enema or suppository use No  History of colostomy No  Incontinent No   BLADDER Normal In and out cath, frequency . Able to self cath  . Bladder incontinence No  Frequent urination No  Leakage with coughing No  Difficulty starting stream No  Incomplete bladder emptying No    Mobility walk without assistance walk with assistance use a cane use a walker ability to climb steps?  yes do you drive?  no  Function disabled: date disabled 04/11/22 I need assistance with the following:  dressing, bathing, meal prep, household duties, and shopping  Neuro/Psych weakness tremor dizziness depression  Prior Studies Any changes since last visit?  no  Physicians involved in your care Any changes since last visit?  no   Family History  Problem Relation Age of Onset   Other Father        MVA   Lupus Sister     Social History   Socioeconomic History   Marital status: Married    Spouse name: Rhyan Radler   Number of children: 4   Years of education: Not on file   Highest education level: Not on file  Occupational History   Occupation: disabled    Comment: projected Production designer, theatre/television/film  Tobacco Use   Smoking status: Former    Current packs/day: 0.00    Average packs/day: 0.5 packs/day for 20.0 years (10.0 ttl pk-yrs)    Types: Cigarettes    Start date: 05/24/1988    Quit date: 05/24/2008    Years since quitting: 14.7   Smokeless tobacco: Never   Tobacco comments:    stopped smoking 8 yrs ago.  Vaping Use   Vaping status: Never Used  Substance and Sexual Activity   Alcohol use: Yes    Comment: rarely   Drug use: No   Sexual activity: Not Currently  Other Topics Concern   Not on file  Social History Narrative   Right hand   Social Determinants of Health   Financial Resource Strain: Not on file  Food  Insecurity: No Food Insecurity (07/29/2022)   Hunger Vital Sign    Worried About Running Out of Food in the Last Year: Never true    Ran Out of Food in the Last Year: Never true  Transportation Needs: No Transportation Needs (07/29/2022)   PRAPARE - Administrator, Civil Service (Medical): No    Lack of Transportation (Non-Medical): No  Physical Activity: Not on file  Stress: Not on file  Social Connections: Unknown (04/29/2022)   [facility]   Social Network    Social Network: Not on file   Past Surgical History:  Procedure Laterality Date   ANKLE FUSION Right 05/05/2012   related to his arthritis   ANKLE FUSION Left 05/25/2013   related to his arthritis   ANKLE SURGERY Bilateral    APPLICATION OF CRANIAL NAVIGATION N/A 04/11/2022   Procedure: APPLICATION OF CRANIAL NAVIGATION;  Surgeon: Lisbeth Renshaw, MD;  Location: [facility] OR;  Service: Neurosurgery;  Laterality: N/A;   APPLICATION OF CRANIAL NAVIGATION N/A 08/08/2022   Procedure:  APPLICATION OF CRANIAL NAVIGATION;  Surgeon: Lisbeth Renshaw, MD;  Location: [facility] OR;  Service: Neurosurgery;  Laterality: N/A;   CARDIAC CATHETERIZATION N/A 05/01/2015   Procedure: Left Heart Cath and Coronary Angiography;  Surgeon: Lyn Records, MD;  Location: [facility] INVASIVE CV LAB;  Service: Cardiovascular;  Laterality: N/A;   CRANIOTOMY N/A 04/11/2022   Procedure: STEREOTACTIC SUBOCCIPITAL CRANIOTOMY FOR RESECTION OF ARTERIO-VENOUS MALFORMATION;  Surgeon: Lisbeth Renshaw, MD;  Location: [facility] OR;  Service: Neurosurgery;  Laterality: N/A;   FRACTURE SURGERY     left femur fracture x 3   IR ANGIO EXTERNAL CAROTID SEL EXT CAROTID BILAT MOD SED  01/28/2022   IR ANGIO INTRA EXTRACRAN SEL INTERNAL CAROTID BILAT MOD SED  01/28/2022   IR ANGIO INTRA EXTRACRAN SEL INTERNAL CAROTID BILAT MOD SED  04/16/2022   IR ANGIO VERTEBRAL SEL VERTEBRAL BILAT MOD SED  01/28/2022   IR ANGIO VERTEBRAL SEL VERTEBRAL BILAT MOD SED  04/16/2022   KNEE ARTHROSCOPY     right x 4   LAPAROSCOPIC REVISION VENTRICULAR-PERITONEAL (V-P) SHUNT N/A 08/08/2022   Procedure: LAPAROSCOPIC VENTRICULAR-PERITONEAL (V-P) SHUNT;  Surgeon: Harriette Bouillon, MD;  Location: [facility] OR;  Service: General;  Laterality: N/A;   LUMBAR LAMINECTOMY/DECOMPRESSION MICRODISCECTOMY N/A 07/18/2022   Procedure: Repair of Pseudomeningiocele posterior;  Surgeon: Lisbeth Renshaw, MD;  Location: [facility] OR;  Service: Neurosurgery;  Laterality: N/A;   NASAL SEPTOPLASTY W/ TURBINOPLASTY  06/25/2011   Procedure: NASAL SEPTOPLASTY WITH TURBINATE REDUCTION;  Surgeon: Osborn Coho, MD;  Location: [facility] OR;  Service: ENT;  Laterality: Bilateral;   PLACEMENT OF LUMBAR DRAIN N/A 07/18/2022   Procedure: PLACEMENT OF LUMBAR DRAIN;  Surgeon: Lisbeth Renshaw, MD;  Location: [facility] OR;  Service: Neurosurgery;  Laterality: N/A;   TONSILLECTOMY     TOTAL KNEE ARTHROPLASTY Right 06/01/2015   Procedure: RIGHT TOTAL KNEE ARTHROPLASTY;  Surgeon: Kathryne Hitch, MD;  Location: [facility]  ORS;  Service: Orthopedics;  Laterality: Right;  Block+general   VENTRICULOPERITONEAL SHUNT Right 08/08/2022   Procedure: LAP ASSITED SHUNT INSERTION VENTRICULAR-PERITONEAL;  Surgeon: Lisbeth Renshaw, MD;  Location: [facility] OR;  Service: Neurosurgery;  Laterality: Right;   Past Medical History:  Diagnosis Date   Anxiety    Arthritis, rheumatoid (HCC)    dx 1988   CAD (coronary artery disease), native coronary artery    Mild LAD disease with calcification noted at cath 12//27/16    Cardiomyopathy (HCC)    Cardiomyopathy (HCC)  Chronic systolic heart failure (HCC) 05/01/2015   Depression    Deviated nasal septum 06/25/2011   Headache    High cholesterol    Hyperlipidemia    Impingement syndrome of left shoulder 12/22/2017   Macular degeneration    Nausea and vomiting 08/28/2022   Rheumatoid aortitis    Sleep apnea    does not use cpap - UNABLE TO TOLERATE MASK   Sleep apnea in adult    deviated septum repaired, most recent sleep study was negative   Status post total right knee replacement 06/01/2015   BP 116/80   Pulse 82   Ht 6\' 3"  (1.905 m)   Wt 174 lb (78.9 kg)   SpO2 95%   BMI 21.75 kg/m   Opioid Risk Score:   Fall Risk Score:  `1  Depression screen [facility] 2/9     10/20/2022    8:51 AM  Depression screen PHQ 2/9  Decreased Interest 1  Down, Depressed, Hopeless 1  PHQ - 2 Score 2    Review of Systems  Constitutional:  Positive for malaise/fatigue. Negative for chills and fever.  Respiratory:  Negative for shortness of breath.   Cardiovascular:  Negative for chest pain.  Gastrointestinal:  Negative for nausea and vomiting.  Musculoskeletal:  Positive for joint pain and neck pain.  Neurological:  Positive for dizziness, tremors, speech change, weakness and headaches.  Psychiatric/Behavioral:  Positive for depression. The patient has insomnia.      PE: Constitution: Appropriate appearance for age. No apparent distress. Resp: No respiratory distress. No accessory  muscle usage. on RA and CTAB Cardio: Well perfused appearance. No peripheral edema. Abdomen: Nondistended. Nontender.   Psych: Depressed mood, mildly flat affect.  Skin: Posterior neck surgical site no longer swollen.    Neurologic Exam:   +Mild intermittent confusion, memory deficits + Speech ataxia, 100% intelligable, appropriate content Babinsky: flexor responses b/l.   Hoffmans: negative b/l Sensory exam: revealed normal sensation in all dermatomal regions in bilateral upper extremities and bilateral lower extremities Motor exam: strength 5/5 throughout bilateral upper extremities and bilateral lower extremities;  LUE shoulder abduction limited <90 degrees passive and AROM by pain  Coordination:  + tremors and ataxia  LUE > RUE; improved from last visit.   Gait: Drift to the left with gait, otherwise stable pattern with cane.   Assessment and plan: Christopher Yu is a 62 y.o. year old male  who  has a past medical history of Anxiety, Arthritis, rheumatoid (HCC), CAD (coronary artery disease), native coronary artery, Cardiomyopathy (HCC), Cardiomyopathy (HCC), Chronic systolic heart failure (HCC) (16/02/9603), Depression, Deviated nasal septum (06/25/2011), Headache, High cholesterol, Hyperlipidemia, Impingement syndrome of left shoulder (12/22/2017), Macular degeneration, Nausea and vomiting (08/28/2022), Rheumatoid aortitis, Sleep apnea, Sleep apnea in adult, and Status post total right knee replacement (06/01/2015).   They are presenting to PM&R clinic for follow up related to  cerebellar AVM s/p resection 04/2022 -> c/b pseudomeningocele s/p repair 07/18/22-> hospitalization and IPR admission 3/25-4/23 for secondary cerebellar stroke and hydrocephalus s/p VP shunt 08/08/22 with Dr. Conchita Paris.  Chronic pain syndrome Rheumatoid arthritis of other site, unspecified whether rheumatoid factor present (HCC)  When you are starting to run low on tramadol, message me through MyChart  and let me know if it is adequately controlling your headaches and joint pains.  If not, we may switch to Norco 5 mg twice daily as needed. - Received refill request from patient 10/2; refilled tramadol  Watch out for potential medication side effects of  tramadol and your antidepressants, including itchiness, irritability, overheating, and nausea.  Chronic headache - bilateral, waxing/waning, intermittent, responsive to tramadol Please resume gabapentin 300 mg twice daily as prescribed.  Pain medication as above  Cerebellar stroke (HCC) Status post craniotomy I will look into contacts for a referral for recreational therapy to try and improve getting you back to golfing.  In the meantime, please do your home exercises and continue to stay active as able. **10/10: messaged patient regarding resources for [employer] and [employer] and [employer] [place] and Inclusion activities calendar. Completed referral inquiry for for [facility] Rec Therapy services  I will fill out the social services/disability form you gave me today and message you through MyChart when it is done.  You will need to come to the office to pick it up and there will be a $20 fee at that time.   - Filled out 10/2, at front desk  Follow-up with me in 3 months.   Insomnia, unspecified type -     Ambulatory referral to Sleep Studies I am sending a referral for a formal sleep study given history of snoring and severe fatigue.   Angelina Sheriff, DO 02/05/2023

## 2023-02-02 ENCOUNTER — Encounter
Payer: BC Managed Care – PPO | Attending: Physical Medicine and Rehabilitation | Admitting: Physical Medicine and Rehabilitation

## 2023-02-02 VITALS — BP 116/80 | HR 82 | Ht 75.0 in | Wt 174.0 lb

## 2023-02-02 DIAGNOSIS — R519 Headache, unspecified: Secondary | ICD-10-CM | POA: Diagnosis not present

## 2023-02-02 DIAGNOSIS — Z9889 Other specified postprocedural states: Secondary | ICD-10-CM | POA: Insufficient documentation

## 2023-02-02 DIAGNOSIS — G894 Chronic pain syndrome: Secondary | ICD-10-CM | POA: Diagnosis not present

## 2023-02-02 DIAGNOSIS — G8929 Other chronic pain: Secondary | ICD-10-CM | POA: Diagnosis not present

## 2023-02-02 DIAGNOSIS — G4700 Insomnia, unspecified: Secondary | ICD-10-CM | POA: Diagnosis not present

## 2023-02-02 DIAGNOSIS — M069 Rheumatoid arthritis, unspecified: Secondary | ICD-10-CM | POA: Diagnosis not present

## 2023-02-02 DIAGNOSIS — I639 Cerebral infarction, unspecified: Secondary | ICD-10-CM | POA: Insufficient documentation

## 2023-02-02 NOTE — Patient Instructions (Signed)
I will look into contacts for a referral for recreational therapy to try and improve getting you back to golfing.  In the meantime, please do your home exercises and continue to stay active as able.  I will fill out the form you gave me today and message you through MyChart when it is done.  You will need to come to the office to pick it up and there will be a $20 fee at that time.  Please resume gabapentin 300 mg twice daily as prescribed.  When you are starting to run low on tramadol, message me through MyChart and let me know if it is adequately controlling your headaches and joint pains.  If not, we may switch to Norco 5 mg twice daily as needed.  Watch out for potential medication side effects of tramadol and your antidepressants, including itchiness, irritability, overheating, and nausea.  I am sending a referral for a formal sleep study given history of snoring and severe fatigue.  Follow-up with me in 3 months.

## 2023-02-04 ENCOUNTER — Other Ambulatory Visit: Payer: Self-pay | Admitting: Physical Medicine and Rehabilitation

## 2023-02-09 DIAGNOSIS — M0579 Rheumatoid arthritis with rheumatoid factor of multiple sites without organ or systems involvement: Secondary | ICD-10-CM | POA: Diagnosis not present

## 2023-02-09 DIAGNOSIS — Z79899 Other long term (current) drug therapy: Secondary | ICD-10-CM | POA: Diagnosis not present

## 2023-02-09 DIAGNOSIS — M79643 Pain in unspecified hand: Secondary | ICD-10-CM | POA: Diagnosis not present

## 2023-02-09 DIAGNOSIS — I429 Cardiomyopathy, unspecified: Secondary | ICD-10-CM | POA: Diagnosis not present

## 2023-02-11 ENCOUNTER — Ambulatory Visit (INDEPENDENT_AMBULATORY_CARE_PROVIDER_SITE_OTHER): Payer: BC Managed Care – PPO | Admitting: Psychology

## 2023-02-11 DIAGNOSIS — F329 Major depressive disorder, single episode, unspecified: Secondary | ICD-10-CM

## 2023-02-11 DIAGNOSIS — F321 Major depressive disorder, single episode, moderate: Secondary | ICD-10-CM

## 2023-02-11 NOTE — Progress Notes (Signed)
[facility] Counselor Initial Adult Exam  Name: Christopher Yu Date: 02/11/2023 MRN: [id number] DOB: 03-18-1961 PCP: Christopher Ates, MD    Guardian/Payee:  N/A    Paperwork requested: No   Reason for Visit Christopher Yu Problem: Adjustment to serious medical illness  Mental Status Exam: Appearance:   Casual     Behavior:  Appropriate  Motor:  Shuffling Gait  Speech/Language:   Slurred  Affect:  Depressed  Mood:  depressed  Thought process:  normal  Thought content:    WNL  Sensory/Perceptual disturbances:    WNL  Orientation:  oriented to person, place, and situation  Attention:  Good  Concentration:  Good  Memory:  WNL  Fund of knowledge:   Good  Insight:    Good  Judgment:   unknown  Impulse Control:  unknown    Reported Symptoms:  Sadness, helplessness and hopelessness  Risk Assessment: Danger to Self:  No Self-injurious Behavior: No Danger to Others: No Duty to Warn:no Physical Aggression / Violence:No  Access to Firearms a concern:  Unknown Gang Involvement:No  Patient / guardian was educated about steps to take if suicide or homicide risk level increases between visits: n/a While future psychiatric events cannot be accurately predicted, the patient does not currently require acute inpatient psychiatric care and does not currently meet [facility] involuntary commitment criteria.  Substance Abuse History: Current substance abuse: No     Past Psychiatric History:   No previous psychological problems have been observed Outpatient Providers:N/A History of Psych Hospitalization: No  Psychological Testing:  none    Abuse History:  Victim of:   N/A   Report needed: No. Victim of Neglect:No. Perpetrator of  N/A   Witness /  Exposure to Domestic Violence: No   Protective Services Involvement: No  Witness to [employer] Violence:  No   Family History:  Family History  Problem Relation Age of Onset   Other Father        Christopher Yu   Lupus Sister     Living situation: the patient lives with their family  Sexual Orientation: Straight  Relationship Status: married  Name of spouse Christopher Yu If a parent, number of children / ages:Two girls, ages 50 and 39  Support Systems: spouse  Surveyor, quantity Stress:  Yes   Income/Employment/Disability: Software engineer:  unknown  Educational History: Education:  unknown  Oncologist: unknown  Any cultural differences that may affect / interfere with treatment:  not applicable   Recreation/Hobbies: golfing  Stressors: Financial difficulties   Health problems    Strengths: Family  Barriers:  severe physical limitations that compromises activity   Legal History: Pending legal issue / charges: The patient has no significant history of legal issues. History of legal issue / charges:  N/A  Medical History/Surgical History: reviewed Past Medical History:  Diagnosis Date   Anxiety  Arthritis, rheumatoid (HCC)    dx 1988   CAD (coronary artery disease), native coronary artery    Mild LAD disease with calcification noted at cath 12//27/16    Cardiomyopathy (HCC)    Cardiomyopathy (HCC)    Chronic systolic heart failure (HCC) 05/01/2015   Depression    Deviated nasal septum 06/25/2011   Headache    High cholesterol    Hyperlipidemia    Impingement syndrome of left shoulder 12/22/2017   Macular degeneration    Nausea and vomiting 08/28/2022   Rheumatoid aortitis    Sleep apnea    does not use cpap - UNABLE TO TOLERATE MASK   Sleep apnea in adult    deviated septum repaired, most recent sleep study was negative   Status post total right knee replacement 06/01/2015    Past Surgical History:  Procedure Laterality Date    ANKLE FUSION Right 05/05/2012   related to his arthritis   ANKLE FUSION Left 05/25/2013   related to his arthritis   ANKLE SURGERY Bilateral    APPLICATION OF CRANIAL NAVIGATION N/A 04/11/2022   Procedure: APPLICATION OF CRANIAL NAVIGATION;  Surgeon: Christopher Renshaw, MD;  Location: [facility] OR;  Service: Neurosurgery;  Laterality: N/A;   APPLICATION OF CRANIAL NAVIGATION N/A 08/08/2022   Procedure: APPLICATION OF CRANIAL NAVIGATION;  Surgeon: Christopher Renshaw, MD;  Location: [facility] OR;  Service: Neurosurgery;  Laterality: N/A;   CARDIAC CATHETERIZATION N/A 05/01/2015   Procedure: Left Heart Cath and Coronary Angiography;  Surgeon: Christopher Records, MD;  Location: [facility] INVASIVE CV LAB;  Service: Cardiovascular;  Laterality: N/A;   CRANIOTOMY N/A 04/11/2022   Procedure: STEREOTACTIC SUBOCCIPITAL CRANIOTOMY FOR RESECTION OF ARTERIO-VENOUS MALFORMATION;  Surgeon: Christopher Renshaw, MD;  Location: [facility] OR;  Service: Neurosurgery;  Laterality: N/A;   FRACTURE SURGERY     left femur fracture x 3   IR ANGIO EXTERNAL CAROTID SEL EXT CAROTID BILAT MOD SED  01/28/2022   IR ANGIO INTRA EXTRACRAN SEL INTERNAL CAROTID BILAT MOD SED  01/28/2022   IR ANGIO INTRA EXTRACRAN SEL INTERNAL CAROTID BILAT MOD SED  04/16/2022   IR ANGIO VERTEBRAL SEL VERTEBRAL BILAT MOD SED  01/28/2022   IR ANGIO VERTEBRAL SEL VERTEBRAL BILAT MOD SED  04/16/2022   KNEE ARTHROSCOPY     right x 4   LAPAROSCOPIC REVISION VENTRICULAR-PERITONEAL (V-P) SHUNT N/A 08/08/2022   Procedure: LAPAROSCOPIC VENTRICULAR-PERITONEAL (V-P) SHUNT;  Surgeon: Christopher Bouillon, MD;  Location: [facility] OR;  Service: General;  Laterality: N/A;   LUMBAR LAMINECTOMY/DECOMPRESSION MICRODISCECTOMY N/A 07/18/2022   Procedure: Repair of Pseudomeningiocele posterior;  Surgeon: Christopher Renshaw, MD;  Location: [facility] OR;  Service: Neurosurgery;  Laterality: N/A;   NASAL SEPTOPLASTY W/ TURBINOPLASTY  06/25/2011   Procedure: NASAL SEPTOPLASTY WITH TURBINATE REDUCTION;  Surgeon: Christopher Coho, MD;  [facility];  Service: ENT;  Laterality: Bilateral;   PLACEMENT OF LUMBAR DRAIN N/A 07/18/2022   Procedure: PLACEMENT OF LUMBAR DRAIN;  Surgeon: Christopher Renshaw, MD;  Location: [facility] OR;  Service: Neurosurgery;  Laterality: N/A;   TONSILLECTOMY     TOTAL KNEE ARTHROPLASTY Right 06/01/2015   Procedure: RIGHT TOTAL KNEE ARTHROPLASTY;  Surgeon: Christopher Hitch, MD;  Location: [facility] ORS;  Service: Orthopedics;  Laterality: Right;  Block+general   VENTRICULOPERITONEAL SHUNT Right 08/08/2022   Procedure: LAP ASSITED SHUNT INSERTION VENTRICULAR-PERITONEAL;  Surgeon: Christopher Renshaw, MD;  Location: [facility] OR;  Service: Neurosurgery;  Laterality: Right;    Medications: Current Outpatient Medications  Medication Sig Dispense Refill   Abatacept (ORENCIA) 50 MG/0.4ML SOSY  Inject into the skin.     acetaminophen (TYLENOL) 325 MG tablet Take 2 tablets (650 mg total) by mouth 3 (three) times daily. (Patient taking differently: Take 650 mg by mouth as needed.)     alendronate (FOSAMAX) 10 MG tablet Take 10 mg by mouth daily before breakfast. Take with a full glass of water on an empty stomach.     artificial tears (LACRILUBE) OINT ophthalmic ointment Place into both eyes 2 (two) times daily.     aspirin EC 81 MG tablet Take 1 tablet (81 mg total) by mouth daily. Swallow whole. 30 tablet 12   calcium carbonate (TUMS - DOSED IN MG ELEMENTAL CALCIUM) 500 MG chewable tablet Chew 1 tablet (200 mg of elemental calcium total) by mouth daily with supper. (Patient taking differently: Chew 1 tablet by mouth as needed.)     Cholecalciferol (VITAMIN D3) 50 MCG (2000 UT) TABS Take 2,000 Units by mouth daily.     diazepam (VALIUM) 5 MG tablet Take 1 tablet (5 mg total) by mouth every 8 (eight) hours as needed for muscle spasms. (Patient not taking: Reported on 11/13/2022) 30 tablet 0   diclofenac Sodium (VOLTAREN) 1 % GEL Apply 2 g topically 4 (four) times daily. (Patient taking differently: Apply 2 g  topically as needed.)     diphenhydrAMINE (SOMINEX) 25 MG tablet Take 25 mg by mouth at bedtime as needed for sleep.     folic acid (FOLVITE) 1 MG tablet Take 1 mg by mouth daily.     methotrexate (RHEUMATREX) 2.5 MG tablet Take 20 mg by mouth once a week. Takes 8 tablets(20mg ) every Saturday.     Caution:Chemotherapy. Protect from light.     metoprolol succinate (TOPROL XL) 25 MG 24 hr tablet Take 1 tablet (25 mg total) by mouth daily. (Patient taking differently: Take 12.5 mg by mouth daily.) 90 tablet 3   Multiple Vitamins-Minerals (PRESERVISION AREDS 2 PO) Take 2 capsules by mouth daily.     Polyethyl Glycol-Propyl Glycol (SYSTANE OP) Apply to eye. 3 - 4 drops 4 times per day     predniSONE (DELTASONE) 10 MG tablet Take 10 mg by mouth every morning.     predniSONE (DELTASONE) 2.5 MG tablet  (Patient not taking: Reported on 02/02/2023)     RITUXAN 500 MG/50ML injection See admin instructions. Every 6 months 2 dose infusion Ruxience (Patient not taking: Reported on 02/02/2023)     rosuvastatin (CRESTOR) 20 MG tablet TAKE 1 TABLET DAILY (DOSE INCREASE) 90 tablet 2   scopolamine (TRANSDERM-SCOP) 1 MG/3DAYS Place 1 patch onto the skin every 3 (three) days. (Patient not taking: Reported on 02/02/2023)     scopolamine (TRANSDERM-SCOP) 1 MG/3DAYS 1 patch to skin behind the ear as needed Transdermal for 15 days (Patient not taking: Reported on 02/02/2023)     sertraline (ZOLOFT) 50 MG tablet Take 100 mg by mouth daily.     spironolactone (ALDACTONE) 25 MG tablet 1/2 tablet Orally once a day     sulfaSALAzine (AZULFIDINE) 500 MG tablet Take 1,000 mg by mouth 2 (two) times daily.      traMADol (ULTRAM) 50 MG tablet Take 1 tablet (50 mg total) by mouth every 12 (twelve) hours as needed for moderate pain. 60 tablet 2   traZODone (DESYREL) 50 MG tablet TAKE 1.5 TABLETS (75 MG TOTAL) BY MOUTH AT BEDTIME AS NEEDED. FOR SLEEP 225 tablet 2   White Petrolatum-Mineral Oil (GENTEAL TEARS NIGHT-TIME OP) Apply to eye at  bedtime.  No current facility-administered medications for this visit.    Allergies  Allergen Reactions   Hydroxychloroquine     Other Reaction(s): cardiolmyopathy    Diagnoses:  Major depression  Plan of Care: Outpatient psychotherapy and medication management as needed. Initial Note: (With wife Christopher Yu). She says that she has hoped Christopher Yu would get into therapy. He has Ataxic Tremor. He told Dr. Arbutus Leas that he would like medication to just sit and do nothing. He says he always had golf to rey on and now cannot play. In December, he went n for a surgery to remove an avm from brain. Was told it was a routine surgery and was told that he needs to get it out. Was told it would be a short recovery and he would be back to work. 1 1/2 hour surgery turned into 6 hour surgery and he had complications. He had extra cerebral fluid and Dr. York Spaniel it would absorb and just wait. He kept declining and by March, they went in to try and drain the extra fluid. He was in hospital for a week. Wife noticed that during the hospital stay, his speech declined and he was clearly weak. Nurses were trying to discharge him and she protested, but they sent him home anyway. He declined, and could not sit, stand, talk or walk and she ended up calling ambulance. In ER, she ws told he had a stroke. He was in ER 24 hours before getting a room in Neuro area. Head of stroke team came in and said it was not a stroke, but complications from surgery. She was blamed by medical team for not "speaking up" before being initially discharged. He ended up in a rehab for 2 weeks and told them he needed another surgery. Was told he had hydrocephalus and need a shunt to drain liquid off his brain. She felt a lot of pressure from team that he needed surgery. She wanted a second opinion and had rouble getting it. Spoke to another Dr. and they decided to get the third surgery. He was in OT,PT and Speech Therapy. During therapy, he started having a tremor.  They ended up being referred to Dr. Arbutus Leas, who has now told him that treating his tremor is tricky and will take time.  Pedram is no longer in OT,PT speech therapy. He is supposed to be doing exercises on his own, but has not been emotionally able to ge himself to do it. He has persistent headaches as well. Just had a CAT Scan and awaiting the results. His headaches are worse and they are concerned. He also has rhumatory arthritis and it is flaring up because he had to stop meds. He has cardiomyopathy, macular degeneration on top of all of these other conditions.  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 He has been approved for disability and are working to get social security disability. She works part time. They have 2 daughters, one with diabetes and arthritis. She is heading to [employer] this Fall and wants to work in Research scientist (physical sciences). Other daughter is 54 and is "our introvert with lots of anxiety". She is in therapy and has mental health issues that is being treated. She will be a Holiday representative at the middle college at [employer].  They are told that medicine will not control his condition, but they will have to try to retrain his brain. Suggested that they work to get back into OT  and PT. She has a hard time motivating him. He admits that he keeps things "inside". She says he is not a good comunicator. He will not share things like if he is in pain or hurts himself.   Tx. Plan/Goals: Patient reports depressive symptoms secondary to the medical trauma he has suffered and the debilitating physical results. He is seeking therapy to resolve his feelings of helplessness and hopelessness. Also want assistance in improving his drive/motivation in daily activity. Will engage in therapy that utilizes behavioral approaches to mitigate symptoms along with insight oriented counseling. Will also incorporate some conjoint sessions with spouse as needed, given her  integral involvement with his care. Patient agrees with plan for treatment. Goal Date is 12-24    Patient agrees to a video session (caregility) and understands the limitations of the platform. He is at home and provider in the office. Session Note:  Patient states that he fell in his living room and was unable to come to the office. He is okay, but it was a little alarming. He has not been able to lift his left arm and therefore has not done much in past few days. He has been practicing the motion of his golf swing and he says he figured what he was "doing wrong". He has started Prednisone (5mg ) daily for a couple of months. It has been a challenge to overcome negative feelings, especially after setbacks like today. His doctor is putting a referral back in for PT and he hopes to get it approved. It has been recommended that he take his Tramadol more frequently.                     Garrel Ridgel, PhD 4:10p-5:00 50 minutes

## 2023-02-12 ENCOUNTER — Encounter: Payer: Self-pay | Admitting: Physical Medicine and Rehabilitation

## 2023-02-18 ENCOUNTER — Ambulatory Visit: Payer: BC Managed Care – PPO | Admitting: Psychology

## 2023-02-19 DIAGNOSIS — Z8673 Personal history of transient ischemic attack (TIA), and cerebral infarction without residual deficits: Secondary | ICD-10-CM | POA: Diagnosis not present

## 2023-02-19 DIAGNOSIS — I5022 Chronic systolic (congestive) heart failure: Secondary | ICD-10-CM | POA: Diagnosis not present

## 2023-02-19 DIAGNOSIS — R2689 Other abnormalities of gait and mobility: Secondary | ICD-10-CM | POA: Diagnosis not present

## 2023-02-19 DIAGNOSIS — G96198 Other disorders of meninges, not elsewhere classified: Secondary | ICD-10-CM | POA: Diagnosis not present

## 2023-02-25 ENCOUNTER — Ambulatory Visit (INDEPENDENT_AMBULATORY_CARE_PROVIDER_SITE_OTHER): Payer: BC Managed Care – PPO | Admitting: Psychology

## 2023-02-25 DIAGNOSIS — F321 Major depressive disorder, single episode, moderate: Secondary | ICD-10-CM | POA: Diagnosis not present

## 2023-02-25 NOTE — Progress Notes (Signed)
[facility] Counselor Initial Adult Exam  Name: Christopher Yu Date: 02/25/2023 MRN: [id number] DOB: Sep 10, 1960 PCP: Thana Ates, MD    Guardian/Payee:  N/A    Paperwork requested: No   Reason for Visit Christopher Yu Problem: Adjustment to serious medical illness  Mental Status Exam: Appearance:   Casual     Behavior:  Appropriate  Motor:  Shuffling Gait  Speech/Language:   Slurred  Affect:  Depressed  Mood:  depressed  Thought process:  normal  Thought content:    WNL  Sensory/Perceptual disturbances:    WNL  Orientation:  oriented to person, place, and situation  Attention:  Good  Concentration:  Good  Memory:  WNL  Fund of knowledge:   Good  Insight:    Good  Judgment:   unknown  Impulse Control:  unknown    Reported Symptoms:  Sadness, helplessness and hopelessness  Risk Assessment: Danger to Self:  No Self-injurious Behavior: No Danger to Others: No Duty to Warn:no Physical Aggression / Violence:No  Access to Firearms a concern:  Unknown Gang Involvement:No  Patient / guardian was educated about steps to take if suicide or homicide risk level increases between visits: n/a While future psychiatric events cannot be accurately predicted, the patient does not currently require acute inpatient psychiatric care and does not currently meet [facility] involuntary commitment criteria.  Substance Abuse History: Current substance abuse: No     Past Psychiatric History:   No previous psychological problems have been observed Outpatient Providers:N/A History of Psych Hospitalization: No  Psychological Testing:  none    Abuse History:  Victim of:   N/A   Report needed: No. Victim of  Neglect:No. Perpetrator of  N/A   Witness / Exposure to Domestic Violence: No   Protective Services Involvement: No  Witness to [employer] Violence:  No   Family History:  Family History  Problem Relation Age of Onset   Other Father        MVA   Lupus Sister     Living situation: the patient lives with their family  Sexual Orientation: Straight  Relationship Status: married  Name of spouse Christopher Yu If a parent, number of children / ages:Two girls, ages 64 and 17  Support Systems: spouse  Surveyor, quantity Stress:  Yes   Income/Employment/Disability: Software engineer:  unknown  Educational History: Education:  unknown  Oncologist: unknown  Any cultural differences that may affect / interfere with treatment:  not applicable   Recreation/Hobbies: golfing  Stressors: Financial difficulties   Health problems    Strengths: Family  Barriers:  severe physical limitations that compromises activity   Legal History: Pending legal issue / charges: The patient has no significant history of legal issues. History of legal issue / charges:  N/A  Medical  History/Surgical History: reviewed Past Medical History:  Diagnosis Date   Anxiety    Arthritis, rheumatoid (HCC)    dx 1988   CAD (coronary artery disease), native coronary artery    Mild LAD disease with calcification noted at cath 12//27/16    Cardiomyopathy (HCC)    Cardiomyopathy (HCC)    Chronic systolic heart failure (HCC) 05/01/2015   Depression    Deviated nasal septum 06/25/2011   Headache    High cholesterol    Hyperlipidemia    Impingement syndrome of left shoulder 12/22/2017   Macular degeneration    Nausea and vomiting 08/28/2022   Rheumatoid aortitis    Sleep apnea    does not use cpap - UNABLE TO TOLERATE MASK   Sleep apnea in adult    deviated septum repaired, most recent sleep study was negative   Status post total right knee replacement 06/01/2015    Past  Surgical History:  Procedure Laterality Date   ANKLE FUSION Right 05/05/2012   related to his arthritis   ANKLE FUSION Left 05/25/2013   related to his arthritis   ANKLE SURGERY Bilateral    APPLICATION OF CRANIAL NAVIGATION N/A 04/11/2022   Procedure: APPLICATION OF CRANIAL NAVIGATION;  Surgeon: Lisbeth Renshaw, MD;  Location: [facility] OR;  Service: Neurosurgery;  Laterality: N/A;   APPLICATION OF CRANIAL NAVIGATION N/A 08/08/2022   Procedure: APPLICATION OF CRANIAL NAVIGATION;  Surgeon: Lisbeth Renshaw, MD;  Location: [facility] OR;  Service: Neurosurgery;  Laterality: N/A;   CARDIAC CATHETERIZATION N/A 05/01/2015   Procedure: Left Heart Cath and Coronary Angiography;  Surgeon: Lyn Records, MD;  Location: [facility] INVASIVE CV LAB;  Service: Cardiovascular;  Laterality: N/A;   CRANIOTOMY N/A 04/11/2022   Procedure: STEREOTACTIC SUBOCCIPITAL CRANIOTOMY FOR RESECTION OF ARTERIO-VENOUS MALFORMATION;  Surgeon: Lisbeth Renshaw, MD;  Location: [facility] OR;  Service: Neurosurgery;  Laterality: N/A;   FRACTURE SURGERY     left femur fracture x 3   IR ANGIO EXTERNAL CAROTID SEL EXT CAROTID BILAT MOD SED  01/28/2022   IR ANGIO INTRA EXTRACRAN SEL INTERNAL CAROTID BILAT MOD SED  01/28/2022   IR ANGIO INTRA EXTRACRAN SEL INTERNAL CAROTID BILAT MOD SED  04/16/2022   IR ANGIO VERTEBRAL SEL VERTEBRAL BILAT MOD SED  01/28/2022   IR ANGIO VERTEBRAL SEL VERTEBRAL BILAT MOD SED  04/16/2022   KNEE ARTHROSCOPY     right x 4   LAPAROSCOPIC REVISION VENTRICULAR-PERITONEAL (V-P) SHUNT N/A 08/08/2022   Procedure: LAPAROSCOPIC VENTRICULAR-PERITONEAL (V-P) SHUNT;  Surgeon: Harriette Bouillon, MD;  Location: [facility] OR;  Service: General;  Laterality: N/A;   LUMBAR LAMINECTOMY/DECOMPRESSION MICRODISCECTOMY N/A 07/18/2022   Procedure: Repair of Pseudomeningiocele posterior;  Surgeon: Lisbeth Renshaw, MD;  Location: [facility] OR;  Service: Neurosurgery;  Laterality: N/A;   NASAL SEPTOPLASTY W/ TURBINOPLASTY  06/25/2011   Procedure: NASAL  SEPTOPLASTY WITH TURBINATE REDUCTION;  Surgeon: Osborn Coho, MD;  Location[facility] OR;  Service: ENT;  Laterality: Bilateral;   PLACEMENT OF LUMBAR DRAIN N/A 07/18/2022   Procedure: PLACEMENT OF LUMBAR DRAIN;  Surgeon: Lisbeth Renshaw, MD;  Location: [facility] OR;  Service: Neurosurgery;  Laterality: N/A;   TONSILLECTOMY     TOTAL KNEE ARTHROPLASTY Right 06/01/2015   Procedure: RIGHT TOTAL KNEE ARTHROPLASTY;  Surgeon: Kathryne Hitch, MD;  Location: [facility] ORS;  Service: Orthopedics;  Laterality: Right;  Block+general   VENTRICULOPERITONEAL SHUNT Right 08/08/2022   Procedure: LAP ASSITED SHUNT INSERTION VENTRICULAR-PERITONEAL;  Surgeon: Lisbeth Renshaw, MD;  Location: [facility] OR;  Service: Neurosurgery;  Laterality: Right;    Medications:  Current Outpatient Medications  Medication Sig Dispense Refill   Abatacept (ORENCIA) 50 MG/0.4ML SOSY Inject into the skin.     acetaminophen (TYLENOL) 325 MG tablet Take 2 tablets (650 mg total) by mouth 3 (three) times daily. (Patient taking differently: Take 650 mg by mouth as needed.)     alendronate (FOSAMAX) 10 MG tablet Take 10 mg by mouth daily before breakfast. Take with a full glass of water on an empty stomach.     artificial tears (LACRILUBE) OINT ophthalmic ointment Place into both eyes 2 (two) times daily.     aspirin EC 81 MG tablet Take 1 tablet (81 mg total) by mouth daily. Swallow whole. 30 tablet 12   calcium carbonate (TUMS - DOSED IN MG ELEMENTAL CALCIUM) 500 MG chewable tablet Chew 1 tablet (200 mg of elemental calcium total) by mouth daily with supper. (Patient taking differently: Chew 1 tablet by mouth as needed.)     Cholecalciferol (VITAMIN D3) 50 MCG (2000 UT) TABS Take 2,000 Units by mouth daily.     diazepam (VALIUM) 5 MG tablet Take 1 tablet (5 mg total) by mouth every 8 (eight) hours as needed for muscle spasms. (Patient not taking: Reported on 11/13/2022) 30 tablet 0   diclofenac Sodium (VOLTAREN) 1 % GEL Apply 2 g topically 4 (four)  times daily. (Patient taking differently: Apply 2 g topically as needed.)     diphenhydrAMINE (SOMINEX) 25 MG tablet Take 25 mg by mouth at bedtime as needed for sleep.     folic acid (FOLVITE) 1 MG tablet Take 1 mg by mouth daily.     methotrexate (RHEUMATREX) 2.5 MG tablet Take 20 mg by mouth once a week. Takes 8 tablets(20mg ) every Saturday.     Caution:Chemotherapy. Protect from light.     metoprolol succinate (TOPROL XL) 25 MG 24 hr tablet Take 1 tablet (25 mg total) by mouth daily. (Patient taking differently: Take 12.5 mg by mouth daily.) 90 tablet 3   Multiple Vitamins-Minerals (PRESERVISION AREDS 2 PO) Take 2 capsules by mouth daily.     Polyethyl Glycol-Propyl Glycol (SYSTANE OP) Apply to eye. 3 - 4 drops 4 times per day     predniSONE (DELTASONE) 10 MG tablet Take 10 mg by mouth every morning.     predniSONE (DELTASONE) 2.5 MG tablet  (Patient not taking: Reported on 02/02/2023)     RITUXAN 500 MG/50ML injection See admin instructions. Every 6 months 2 dose infusion Ruxience (Patient not taking: Reported on 02/02/2023)     rosuvastatin (CRESTOR) 20 MG tablet TAKE 1 TABLET DAILY (DOSE INCREASE) 90 tablet 2   scopolamine (TRANSDERM-SCOP) 1 MG/3DAYS Place 1 patch onto the skin every 3 (three) days. (Patient not taking: Reported on 02/02/2023)     scopolamine (TRANSDERM-SCOP) 1 MG/3DAYS 1 patch to skin behind the ear as needed Transdermal for 15 days (Patient not taking: Reported on 02/02/2023)     sertraline (ZOLOFT) 50 MG tablet Take 100 mg by mouth daily.     spironolactone (ALDACTONE) 25 MG tablet 1/2 tablet Orally once a day     sulfaSALAzine (AZULFIDINE) 500 MG tablet Take 1,000 mg by mouth 2 (two) times daily.      traMADol (ULTRAM) 50 MG tablet Take 1 tablet (50 mg total) by mouth every 12 (twelve) hours as needed for moderate pain. 60 tablet 2   traZODone (DESYREL) 50 MG tablet TAKE 1.5 TABLETS (75 MG TOTAL) BY MOUTH AT BEDTIME AS NEEDED. FOR SLEEP 225 tablet 2   White  Petrolatum-Mineral Oil (GENTEAL TEARS NIGHT-TIME OP) Apply to eye at bedtime.     No current facility-administered medications for this visit.    Allergies  Allergen Reactions   Hydroxychloroquine     Other Reaction(s): cardiolmyopathy    Diagnoses:  Major depression  Plan of Care: Outpatient psychotherapy and medication management as needed. Initial Note: (With wife Christopher Yu). She says that she has hoped Corky would get into therapy. He has Ataxic Tremor. He told Dr. Arbutus Leas that he would like medication to just sit and do nothing. He says he always had golf to rey on and now cannot play. In December, he went n for a surgery to remove an avm from brain. Was told it was a routine surgery and was told that he needs to get it out. Was told it would be a short recovery and he would be back to work. 1 1/2 hour surgery turned into 6 hour surgery and he had complications. He had extra cerebral fluid and Dr. York Spaniel it would absorb and just wait. He kept declining and by March, they went in to try and drain the extra fluid. He was in hospital for a week. Wife noticed that during the hospital stay, his speech declined and he was clearly weak. Nurses were trying to discharge him and she protested, but they sent him home anyway. He declined, and could not sit, stand, talk or walk and she ended up calling ambulance. In ER, she ws told he had a stroke. He was in ER 24 hours before getting a room in Neuro area. Head of stroke team came in and said it was not a stroke, but complications from surgery. She was blamed by medical team for not "speaking up" before being initially discharged. He ended up in a rehab for 2 weeks and told them he needed another surgery. Was told he had hydrocephalus and need a shunt to drain liquid off his brain. She felt a lot of pressure from team that he needed surgery. She wanted a second opinion and had rouble getting it. Spoke to another Dr. and they decided to get the third surgery. He was in  OT,PT and Speech Therapy. During therapy, he started having a tremor. They ended up being referred to Dr. Arbutus Leas, who has now told him that treating his tremor is tricky and will take time.  Courvoisier is no longer in OT,PT speech therapy. He is supposed to be doing exercises on his own, but has not been emotionally able to ge himself to do it. He has persistent headaches as well. Just had a CAT Scan and awaiting the results. His headaches are worse and they are concerned. He also has rhumatory arthritis and it is flaring up because he had to stop meds. He has cardiomyopathy, macular degeneration on top of all of these other conditions.  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 He has been approved for disability and are working to get social security disability. She works part time. They have 2 daughters, one with diabetes and arthritis. She is heading to [employer] this Fall and wants to work in Research scientist (physical sciences). Other daughter is 34 and is "our introvert with lots of anxiety". She is in therapy and has mental health issues that is being treated. She will be a Holiday representative at the middle college at [employer].  They are told that medicine will not control his condition, but they will have  to try to retrain his brain. Suggested that they work to get back into OT and PT. She has a hard time motivating him. He admits that he keeps things "inside". She says he is not a good comunicator. He will not share things like if he is in pain or hurts himself.   Tx. Plan/Goals: Patient reports depressive symptoms secondary to the medical trauma he has suffered and the debilitating physical results. He is seeking therapy to resolve his feelings of helplessness and hopelessness. Also want assistance in improving his drive/motivation in daily activity. Will engage in therapy that utilizes behavioral approaches to mitigate symptoms along with insight oriented counseling. Will also  incorporate some conjoint sessions with spouse as needed, given her integral involvement with his care. Patient agrees with plan for treatment. Goal Date is 12-24    Patient was seen in the provider's office for this session.  Session Note:  Patient states that he recovered from the fall. He says that if he has another fall, he will go to doctor right away. He says that he now is careful that he assess if he feels dizzy before he gets up. He saw his primary care doctor and he is now on a taper to get off of Sertraline and start on Lexapro. He did not feel that the Sertraline was helping. States that he has to force himself to get up and out of bed in the morning. He says that his wife keeps him on his toes.                        Garrel Ridgel, PhD 4:05p-5:00 55 minutes

## 2023-03-04 ENCOUNTER — Ambulatory Visit (INDEPENDENT_AMBULATORY_CARE_PROVIDER_SITE_OTHER): Payer: BC Managed Care – PPO | Admitting: Psychology

## 2023-03-04 DIAGNOSIS — F329 Major depressive disorder, single episode, unspecified: Secondary | ICD-10-CM

## 2023-03-04 DIAGNOSIS — F321 Major depressive disorder, single episode, moderate: Secondary | ICD-10-CM

## 2023-03-04 NOTE — Progress Notes (Signed)
[facility] Counselor Initial Adult Exam  Name: Christopher Yu Date: 03/04/2023 MRN: [id number] DOB: [birth year] PCP: Thana Ates, MD    Guardian/Payee:  N/A    Paperwork requested: No   Reason for Visit Christopher Yu Problem: Adjustment to serious medical illness  Mental Status Exam: Appearance:   Casual     Behavior:  Appropriate  Motor:  Shuffling Gait  Speech/Language:   Slurred  Affect:  Depressed  Mood:  depressed  Thought process:  normal  Thought content:    WNL  Sensory/Perceptual disturbances:    WNL  Orientation:  oriented to person, place, and situation  Attention:  Good  Concentration:  Good  Memory:  WNL  Fund of knowledge:   Good  Insight:    Good  Judgment:   unknown  Impulse Control:  unknown    Reported Symptoms:  Sadness, helplessness and hopelessness  Risk Assessment: Danger to Self:  No Self-injurious Behavior: No Danger to Others: No Duty to Warn:no Physical Aggression / Violence:No  Access to Firearms a concern:  Unknown Gang Involvement:No  Patient / guardian was educated about steps to take if suicide or homicide risk level increases between visits: n/a While future psychiatric events cannot be accurately predicted, the patient does not currently require acute inpatient psychiatric care and does not currently meet [facility] involuntary commitment criteria.  Substance Abuse History: Current substance abuse: No     Past Psychiatric History:   No previous psychological problems have been observed Outpatient Providers:N/A History of Psych Hospitalization: No  Psychological Testing:  none    Abuse History:  Victim of:   N/A   Report needed:  No. Victim of Neglect:No. Perpetrator of  N/A   Witness / Exposure to Domestic Violence: No   Protective Services Involvement: No  Witness to [employer] Violence:  No   Family History:  Family History  Problem Relation Age of Onset   Other Father        MVA   Lupus Sister     Living situation: the patient lives with their family  Sexual Orientation: Straight  Relationship Status: married  Name of spouse Christopher Yu If a parent, number of children / ages:Two girls, ages 20 and 37  Support Systems: spouse  Surveyor, quantity Stress:  Yes   Income/Employment/Disability: Software engineer:  unknown  Educational History: Education:  unknown  Oncologist: unknown  Any cultural differences that may affect / interfere with treatment:  not applicable   Recreation/Hobbies: golfing  Stressors: Financial difficulties   Health problems    Strengths: Family  Barriers:  severe physical limitations that compromises activity   Legal History: Pending legal issue / charges: The patient has no  significant history of legal issues. History of legal issue / charges:  N/A  Medical History/Surgical History: reviewed Past Medical History:  Diagnosis Date   Anxiety    Arthritis, rheumatoid (HCC)    dx 1988   CAD (coronary artery disease), native coronary artery    Mild LAD disease with calcification noted at cath 12//27/16    Cardiomyopathy (HCC)    Cardiomyopathy (HCC)    Chronic systolic heart failure (HCC) 05/01/2015   Depression    Deviated nasal septum 06/25/2011   Headache    High cholesterol    Hyperlipidemia    Impingement syndrome of left shoulder 12/22/2017   Macular degeneration    Nausea and vomiting 08/28/2022   Rheumatoid aortitis    Sleep apnea    does not use cpap - UNABLE TO TOLERATE MASK   Sleep apnea in adult    deviated septum repaired, most recent sleep study was negative   Status post total right knee replacement 06/01/2015     Past Surgical History:  Procedure Laterality Date   ANKLE FUSION Right 05/05/2012   related to his arthritis   ANKLE FUSION Left 05/25/2013   related to his arthritis   ANKLE SURGERY Bilateral    APPLICATION OF CRANIAL NAVIGATION N/A 04/11/2022   Procedure: APPLICATION OF CRANIAL NAVIGATION;  Surgeon: Lisbeth Renshaw, MD;  Location: [facility] OR;  Service: Neurosurgery;  Laterality: N/A;   APPLICATION OF CRANIAL NAVIGATION N/A 08/08/2022   Procedure: APPLICATION OF CRANIAL NAVIGATION;  Surgeon: Lisbeth Renshaw, MD;  Location: [facility] OR;  Service: Neurosurgery;  Laterality: N/A;   CARDIAC CATHETERIZATION N/A 05/01/2015   Procedure: Left Heart Cath and Coronary Angiography;  Surgeon: Lyn Records, MD;  Location: [facility] INVASIVE CV LAB;  Service: Cardiovascular;  Laterality: N/A;   CRANIOTOMY N/A 04/11/2022   Procedure: STEREOTACTIC SUBOCCIPITAL CRANIOTOMY FOR RESECTION OF ARTERIO-VENOUS MALFORMATION;  Surgeon: Lisbeth Renshaw, MD;  Location: [facility] OR;  Service: Neurosurgery;  Laterality: N/A;   FRACTURE SURGERY     left femur fracture x 3   IR ANGIO EXTERNAL CAROTID SEL EXT CAROTID BILAT MOD SED  01/28/2022   IR ANGIO INTRA EXTRACRAN SEL INTERNAL CAROTID BILAT MOD SED  01/28/2022   IR ANGIO INTRA EXTRACRAN SEL INTERNAL CAROTID BILAT MOD SED  04/16/2022   IR ANGIO VERTEBRAL SEL VERTEBRAL BILAT MOD SED  01/28/2022   IR ANGIO VERTEBRAL SEL VERTEBRAL BILAT MOD SED  04/16/2022   KNEE ARTHROSCOPY     right x 4   LAPAROSCOPIC REVISION VENTRICULAR-PERITONEAL (V-P) SHUNT N/A 08/08/2022   Procedure: LAPAROSCOPIC VENTRICULAR-PERITONEAL (V-P) SHUNT;  Surgeon: Harriette Bouillon, MD;  Location: [facility] OR;  Service: General;  Laterality: N/A;   LUMBAR LAMINECTOMY/DECOMPRESSION MICRODISCECTOMY N/A 07/18/2022   Procedure: Repair of Pseudomeningiocele posterior;  Surgeon: Lisbeth Renshaw, MD;  Location: [facility] OR;  Service: Neurosurgery;  Laterality: N/A;   NASAL SEPTOPLASTY W/ TURBINOPLASTY  06/25/2011   Procedure:  NASAL SEPTOPLASTY WITH TURBINATE REDUCTION;  Surgeon: Osborn Coho, MD;  Location[facility] OR;  Service: ENT;  Laterality: Bilateral;   PLACEMENT OF LUMBAR DRAIN N/A 07/18/2022   Procedure: PLACEMENT OF LUMBAR DRAIN;  Surgeon: Lisbeth Renshaw, MD;  Location: [facility] OR;  Service: Neurosurgery;  Laterality: N/A;   TONSILLECTOMY     TOTAL KNEE ARTHROPLASTY Right 06/01/2015   Procedure: RIGHT TOTAL KNEE ARTHROPLASTY;  Surgeon: Kathryne Hitch, MD;  Location: [facility] ORS;  Service: Orthopedics;  Laterality: Right;  Block+general   VENTRICULOPERITONEAL SHUNT Right 08/08/2022   Procedure: LAP ASSITED SHUNT INSERTION VENTRICULAR-PERITONEAL;  Surgeon: Lisbeth Renshaw,  MD;  Location: [facility] OR;  Service: Neurosurgery;  Laterality: Right;    Medications: Current Outpatient Medications  Medication Sig Dispense Refill   Abatacept (ORENCIA) 50 MG/0.4ML SOSY Inject into the skin.     acetaminophen (TYLENOL) 325 MG tablet Take 2 tablets (650 mg total) by mouth 3 (three) times daily. (Patient taking differently: Take 650 mg by mouth as needed.)     alendronate (FOSAMAX) 10 MG tablet Take 10 mg by mouth daily before breakfast. Take with a full glass of water on an empty stomach.     artificial tears (LACRILUBE) OINT ophthalmic ointment Place into both eyes 2 (two) times daily.     aspirin EC 81 MG tablet Take 1 tablet (81 mg total) by mouth daily. Swallow whole. 30 tablet 12   calcium carbonate (TUMS - DOSED IN MG ELEMENTAL CALCIUM) 500 MG chewable tablet Chew 1 tablet (200 mg of elemental calcium total) by mouth daily with supper. (Patient taking differently: Chew 1 tablet by mouth as needed.)     Cholecalciferol (VITAMIN D3) 50 MCG (2000 UT) TABS Take 2,000 Units by mouth daily.     diazepam (VALIUM) 5 MG tablet Take 1 tablet (5 mg total) by mouth every 8 (eight) hours as needed for muscle spasms. (Patient not taking: Reported on 11/13/2022) 30 tablet 0   diclofenac Sodium (VOLTAREN) 1 % GEL Apply 2 g topically 4  (four) times daily. (Patient taking differently: Apply 2 g topically as needed.)     diphenhydrAMINE (SOMINEX) 25 MG tablet Take 25 mg by mouth at bedtime as needed for sleep.     folic acid (FOLVITE) 1 MG tablet Take 1 mg by mouth daily.     methotrexate (RHEUMATREX) 2.5 MG tablet Take 20 mg by mouth once a week. Takes 8 tablets(20mg ) every Saturday.     Caution:Chemotherapy. Protect from light.     metoprolol succinate (TOPROL XL) 25 MG 24 hr tablet Take 1 tablet (25 mg total) by mouth daily. (Patient taking differently: Take 12.5 mg by mouth daily.) 90 tablet 3   Multiple Vitamins-Minerals (PRESERVISION AREDS 2 PO) Take 2 capsules by mouth daily.     Polyethyl Glycol-Propyl Glycol (SYSTANE OP) Apply to eye. 3 - 4 drops 4 times per day     predniSONE (DELTASONE) 10 MG tablet Take 10 mg by mouth every morning.     predniSONE (DELTASONE) 2.5 MG tablet  (Patient not taking: Reported on 02/02/2023)     RITUXAN 500 MG/50ML injection See admin instructions. Every 6 months 2 dose infusion Ruxience (Patient not taking: Reported on 02/02/2023)     rosuvastatin (CRESTOR) 20 MG tablet TAKE 1 TABLET DAILY (DOSE INCREASE) 90 tablet 2   scopolamine (TRANSDERM-SCOP) 1 MG/3DAYS Place 1 patch onto the skin every 3 (three) days. (Patient not taking: Reported on 02/02/2023)     scopolamine (TRANSDERM-SCOP) 1 MG/3DAYS 1 patch to skin behind the ear as needed Transdermal for 15 days (Patient not taking: Reported on 02/02/2023)     sertraline (ZOLOFT) 50 MG tablet Take 100 mg by mouth daily.     spironolactone (ALDACTONE) 25 MG tablet 1/2 tablet Orally once a day     sulfaSALAzine (AZULFIDINE) 500 MG tablet Take 1,000 mg by mouth 2 (two) times daily.      traMADol (ULTRAM) 50 MG tablet Take 1 tablet (50 mg total) by mouth every 12 (twelve) hours as needed for moderate pain. 60 tablet 2   traZODone (DESYREL) 50 MG tablet TAKE 1.5 TABLETS (75 MG TOTAL)  BY MOUTH AT BEDTIME AS NEEDED. FOR SLEEP 225 tablet 2   White  Petrolatum-Mineral Oil (GENTEAL TEARS NIGHT-TIME OP) Apply to eye at bedtime.     No current facility-administered medications for this visit.    Allergies  Allergen Reactions   Hydroxychloroquine     Other Reaction(s): cardiolmyopathy    Diagnoses:  Major depression  Plan of Care: Outpatient psychotherapy and medication management as needed. Initial Note: (With wife Christopher Yu). She says that she has hoped Kener would get into therapy. He has Ataxic Tremor. He told Dr. Arbutus Leas that he would like medication to just sit and do nothing. He says he always had golf to rey on and now cannot play. In December, he went n for a surgery to remove an avm from brain. Was told it was a routine surgery and was told that he needs to get it out. Was told it would be a short recovery and he would be back to work. 1 1/2 hour surgery turned into 6 hour surgery and he had complications. He had extra cerebral fluid and Dr. York Spaniel it would absorb and just wait. He kept declining and by March, they went in to try and drain the extra fluid. He was in hospital for a week. Wife noticed that during the hospital stay, his speech declined and he was clearly weak. Nurses were trying to discharge him and she protested, but they sent him home anyway. He declined, and could not sit, stand, talk or walk and she ended up calling ambulance. In ER, she ws told he had a stroke. He was in ER 24 hours before getting a room in Neuro area. Head of stroke team came in and said it was not a stroke, but complications from surgery. She was blamed by medical team for not "speaking up" before being initially discharged. He ended up in a rehab for 2 weeks and told them he needed another surgery. Was told he had hydrocephalus and need a shunt to drain liquid off his brain. She felt a lot of pressure from team that he needed surgery. She wanted a second opinion and had rouble getting it. Spoke to another Dr. and they decided to get the third surgery. He was in  OT,PT and Speech Therapy. During therapy, he started having a tremor. They ended up being referred to Dr. Arbutus Leas, who has now told him that treating his tremor is tricky and will take time.  Anthonie is no longer in OT,PT speech therapy. He is supposed to be doing exercises on his own, but has not been emotionally able to ge himself to do it. He has persistent headaches as well. Just had a CAT Scan and awaiting the results. His headaches are worse and they are concerned. He also has rhumatory arthritis and it is flaring up because he had to stop meds. He has cardiomyopathy, macular degeneration on top of all of these other conditions.  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 He has been approved for disability and are working to get social security disability. She works part time. They have 2 daughters, one with diabetes and arthritis. She is heading to [employer] this Fall and wants to work in Research scientist (physical sciences). Other daughter is 47 and is "our introvert with lots of anxiety". She is in therapy and has mental health issues that is being treated. She will be a Holiday representative at the middle college at [employer].  They are told that medicine will not control his condition, but they will have to try to retrain his brain. Suggested that they work to get back into OT and PT. She has a hard time motivating him. He admits that he keeps things "inside". She says he is not a good comunicator. He will not share things like if he is in pain or hurts himself.   Tx. Plan/Goals: Patient reports depressive symptoms secondary to the medical trauma he has suffered and the debilitating physical results. He is seeking therapy to resolve his feelings of helplessness and hopelessness. Also want assistance in improving his drive/motivation in daily activity. Will engage in therapy that utilizes behavioral approaches to mitigate symptoms along with insight oriented counseling. Will also  incorporate some conjoint sessions with spouse as needed, given her integral involvement with his care. Patient agrees with plan for treatment. Goal Date is 12-24    Patient agrees to a video session and is aware of the limitations of this platform. He is at home and provider is in his office.   Session Note:  Patient states he is still transitioning off of Sertraline and on to Lexapro. Is going to see some of his friends this weekend. He will go with them out to the golf course. Says his depression is currently about a 6 on a 10 point scale. He is especially upset about his tremor and says it contributes to his depression. I have written Dr. Arbutus Leas to see if there is a medication that can help. He has set a goal of taking his depression from a 6 to a 3. We will talk about the strategies to chip away at his distress.                           Garrel Ridgel, PhD 3:15p-4:00 45 minutes

## 2023-03-05 ENCOUNTER — Encounter: Payer: Self-pay | Admitting: Neurology

## 2023-03-05 ENCOUNTER — Ambulatory Visit: Payer: BC Managed Care – PPO | Admitting: Neurology

## 2023-03-05 VITALS — BP 134/79 | HR 80 | Ht 74.0 in | Wt 176.0 lb

## 2023-03-05 DIAGNOSIS — Z9189 Other specified personal risk factors, not elsewhere classified: Secondary | ICD-10-CM

## 2023-03-05 DIAGNOSIS — I5022 Chronic systolic (congestive) heart failure: Secondary | ICD-10-CM

## 2023-03-05 DIAGNOSIS — I639 Cerebral infarction, unspecified: Secondary | ICD-10-CM | POA: Diagnosis not present

## 2023-03-05 DIAGNOSIS — Q282 Arteriovenous malformation of cerebral vessels: Secondary | ICD-10-CM

## 2023-03-05 DIAGNOSIS — G9782 Other postprocedural complications and disorders of nervous system: Secondary | ICD-10-CM | POA: Diagnosis not present

## 2023-03-05 NOTE — Patient Instructions (Signed)
Screening for Sleep Apnea  Sleep apnea is a condition in which breathing pauses or becomes shallow during sleep. Sleep apnea screening is a test to determine if you are at risk for sleep apnea. The test includes a series of questions. It will only takes a few minutes. Your health care provider may ask you to have this test in preparation for surgery or as part of a physical exam. What are the symptoms of sleep apnea? Common symptoms of sleep apnea include: Snoring. Waking up often at night. Daytime sleepiness. Pauses in breathing. Choking or gasping during sleep. Irritability. Forgetfulness. Trouble thinking clearly. Depression. Personality changes. Most people with sleep apnea do not know that they have it. What are the advantages of sleep apnea screening? Getting screened for sleep apnea can help: Ensure your safety. It is important for your health care providers to know whether or not you have sleep apnea, especially if you are having surgery or have other long-term (chronic) health conditions. Improve your health and allow you to get a better night's rest. Restful sleep can help you: Have more energy. Lose weight. Improve high blood pressure. Improve diabetes management. Prevent stroke. Prevent car accidents. What happens during the screening? Screening usually includes being asked a list of questions about your sleep quality. Some questions you may be asked include: Do you snore? Is your sleep restless? Do you have daytime sleepiness? Has a partner or spouse told you that you stop breathing during sleep? Have you had trouble concentrating or memory loss? What is your age? What is your neck circumference? To measure your neck, keep your back straight and gently wrap the tape measure around your neck. Put the tape measure at the middle of your neck, between your chin and collarbone. What is your sex assigned at birth? Do you have or are you being treated for high blood  pressure? If your screening test is positive, you are at risk for the condition. Further testing may be needed to confirm a diagnosis of sleep apnea. Where to find more information You can find screening tools online or at your health care clinic. For more information about sleep apnea screening and healthy sleep, visit these websites: Centers for Disease Control and Prevention: www.cdc.gov American Sleep Apnea Association: www.sleepapnea.org Contact a health care provider if: You think that you may have sleep apnea. Summary Sleep apnea screening can help determine if you are at risk for sleep apnea. It is important for your health care providers to know whether or not you have sleep apnea, especially if you are having surgery or have other chronic health conditions. You may be asked to take a screening test for sleep apnea in preparation for surgery or as part of a physical exam. This information is not intended to replace advice given to you by your health care provider. Make sure you discuss any questions you have with your health care provider. Document Revised: 03/30/2020 Document Reviewed: 03/30/2020 Elsevier Patient Education  2024 [employer]  

## 2023-03-05 NOTE — Progress Notes (Signed)
SLEEP MEDICINE CLINIC    Provider:  Melvyn Novas, MD  Primary Care Physician:  Thana Ates, MD [address] [place] [address]     Referring Provider: Angelina Sheriff, Drucilla Schmidt [address] [place],  [place] [address]   Neurologist : Dr Tat, DO , seen just 3 months ago for ataxic Tremor.          Chief Complaint according to patient   Patient presents with:     New Patient (Initial Visit)           HISTORY OF PRESENT ILLNESS:  Christopher Yu is a 62 y.o. male patient who is seen upon referral on 03/05/2023 from Rehab physician Dr Shearon Stalls , for constant fatigue since having suffered a stroke in March 2024.  He had also 3 brain surgeries  for AVM, pseudo-meningocele, NPH , VP shunt - prior to the stroke, has been diagnosed with ataxic Tremor after the stroke ( referred by [facility]). Dr Tat has told the patient that there is little benefit from any medication. He is depressed as he can't  GOLF any longer.    Chief concern according to patient :  " I just sleep poorly, I had sleep studies 10-15 year ago, first one was positive for apnea, the second one  was negative.  Snoring, my wife witnesses pausing".    I have the pleasure of seeing Christopher Yu 03/05/23 a right -handed male with an untreated sleep disorder.    The patient had the first sleep study in [place], and the second on [address]  across from [place]. .   Sleep relevant medical history: Nocturia / snoring. Witnessed  apneas and snoring, restless sleep. Hemorrhagic (?) stroke following an AVM  surgery, with pseudomeningocele. Vascular ataxic tremor,.   Family medical /sleep history: NO  other family member on CPAP with OSA, but insomnia.    Social history:  Patient is working Emergency planning/management officer  for a [employer]  and in long term disability-  spouse  and 2 children, one in college. He worked with many customers in different time zones.   Tobacco  use: quit 2006  ETOH use: none now- used to drink socially ,  Caffeine intake in form of Coffee( 1 cup in AM ) Soda( 3-4 throughout the day ) Tea ( /) energy drinks      Sleep habits are as follows: The patient's dinner time is between 5-8 PM. The patient goes to bed at 11-12 PM and has difficulties to fall asleep- CHRONIC INSOMNIA for ever , continues to sleep for 90 minutes  , then wakes for  bathroom break, then sleeps again 3 hours.   The preferred sleep position is laterally, with the support of 2 pillows. Dreams are reportedly frequent.   The patient wakes up  with external noises at 7 AM , * AM is the usual rise time. He reports not feeling refreshed or restored in AM, with symptoms such as dry mouth, morning headaches, and residual fatigue.  Naps are taken every  other day lasting from 2 hours and are no more refreshing than nocturnal sleep.    Review of Systems: Out of a complete 14 system review, the patient complains of only the following symptoms, and all other reviewed systems are negative.:  Fatigue, sleepiness , snoring, fragmented sleep, Insomnia, RLS, Nocturia  tremor ataxic, vascular   Headache    How likely are you to  doze in the following situations: 0 = not likely, 1 = slight chance, 2 = moderate chance, 3 = high chance   Sitting and Reading? Watching Television? Sitting inactive in a public place (theater or meeting)? As a passenger in a car for an hour without a break? Lying down in the afternoon when circumstances permit? Sitting and talking to someone? Sitting quietly after lunch without alcohol? In a car, while stopped for a few minutes in traffic?   Total = 3/ 24 points   FSS endorsed at 57/ 63 points. (!!)   Social History   Socioeconomic History   Marital status: Married    Spouse name: Christopher Yu   Number of children: 4   Years of education: Not on file   Highest education level: Not on file  Occupational History   Occupation: disabled     Comment: projected Production designer, theatre/television/film  Tobacco Use   Smoking status: Former    Current packs/day: 0.00    Average packs/day: 0.5 packs/day for 20.0 years (10.0 ttl pk-yrs)    Types: Cigarettes    Start date: 05/24/1988    Quit date: 05/24/2008    Years since quitting: 14.7   Smokeless tobacco: Never   Tobacco comments:    stopped smoking 8 yrs ago.  Vaping Use   Vaping status: Never Used  Substance and Sexual Activity   Alcohol use: Yes    Comment: rarely   Drug use: No   Sexual activity: Not Currently  Other Topics Concern   Not on file  Social History Narrative   Right hand   Social Determinants of Health   Financial Resource Strain: Not on file  Food Insecurity: No Food Insecurity (07/29/2022)   Hunger Vital Sign    Worried About Running Out of Food in the Last Year: Never true    Ran Out of Food in the Last Year: Never true  Transportation Needs: No Transportation Needs (07/29/2022)   PRAPARE - Administrator, Civil Service (Medical): No    Lack of Transportation (Non-Medical): No  Physical Activity: Not on file  Stress: Not on file  Social Connections: Unknown (04/29/2022)   [facility]   Social Network    Social Network: Not on file    Family History  Problem Relation Age of Onset   Other Father        MVA   Lupus Sister     Past Medical History:  Diagnosis Date   Anxiety    Arthritis, rheumatoid (HCC)    dx 1988   CAD (coronary artery disease), native coronary artery    Mild LAD disease with calcification noted at cath 12//27/16    Cardiomyopathy (HCC)    Cardiomyopathy (HCC)    Chronic systolic heart failure (HCC) 05/01/2015   Depression    Deviated nasal septum 06/25/2011   Headache    High cholesterol    Hyperlipidemia    Impingement syndrome of left shoulder 12/22/2017   Macular degeneration    Nausea and vomiting 08/28/2022   Rheumatoid aortitis    Sleep apnea    does not use cpap - UNABLE TO TOLERATE MASK    Sleep apnea in adult    deviated septum repaired, most recent sleep study was negative   Status post total right knee replacement 06/01/2015    Past Surgical History:  Procedure Laterality Date   ANKLE FUSION Right 05/05/2012   related to his arthritis   ANKLE FUSION Left 05/25/2013  related to his arthritis   ANKLE SURGERY Bilateral    APPLICATION OF CRANIAL NAVIGATION N/A 04/11/2022   Procedure: APPLICATION OF CRANIAL NAVIGATION;  Surgeon: Lisbeth Renshaw, MD;  Location: [facility] OR;  Service: Neurosurgery;  Laterality: N/A;   APPLICATION OF CRANIAL NAVIGATION N/A 08/08/2022   Procedure: APPLICATION OF CRANIAL NAVIGATION;  Surgeon: Lisbeth Renshaw, MD;  Location: [facility] OR;  Service: Neurosurgery;  Laterality: N/A;   CARDIAC CATHETERIZATION N/A 05/01/2015   Procedure: Left Heart Cath and Coronary Angiography;  Surgeon: Lyn Records, MD;  Location: [facility] INVASIVE CV LAB;  Service: Cardiovascular;  Laterality: N/A;   CRANIOTOMY N/A 04/11/2022   Procedure: STEREOTACTIC SUBOCCIPITAL CRANIOTOMY FOR RESECTION OF ARTERIO-VENOUS MALFORMATION;  Surgeon: Lisbeth Renshaw, MD;  Location: [facility] OR;  Service: Neurosurgery;  Laterality: N/A;   FRACTURE SURGERY     left femur fracture x 3   IR ANGIO EXTERNAL CAROTID SEL EXT CAROTID BILAT MOD SED  01/28/2022   IR ANGIO INTRA EXTRACRAN SEL INTERNAL CAROTID BILAT MOD SED  01/28/2022   IR ANGIO INTRA EXTRACRAN SEL INTERNAL CAROTID BILAT MOD SED  04/16/2022   IR ANGIO VERTEBRAL SEL VERTEBRAL BILAT MOD SED  01/28/2022   IR ANGIO VERTEBRAL SEL VERTEBRAL BILAT MOD SED  04/16/2022   KNEE ARTHROSCOPY     right x 4   LAPAROSCOPIC REVISION VENTRICULAR-PERITONEAL (V-P) SHUNT N/A 08/08/2022   Procedure: LAPAROSCOPIC VENTRICULAR-PERITONEAL (V-P) SHUNT;  Surgeon: Harriette Bouillon, MD;  Location: [facility] OR;  Service: General;  Laterality: N/A;   LUMBAR LAMINECTOMY/DECOMPRESSION MICRODISCECTOMY N/A 07/18/2022   Procedure: Repair of Pseudomeningiocele posterior;  Surgeon:  Lisbeth Renshaw, MD;  Location: [facility] OR;  Service: Neurosurgery;  Laterality: N/A;   NASAL SEPTOPLASTY W/ TURBINOPLASTY  06/25/2011   Procedure: NASAL SEPTOPLASTY WITH TURBINATE REDUCTION;  Surgeon: Osborn Coho, MD;  Location: [facility] OR;  Service: ENT;  Laterality: Bilateral;   PLACEMENT OF LUMBAR DRAIN N/A 07/18/2022   Procedure: PLACEMENT OF LUMBAR DRAIN;  Surgeon: Lisbeth Renshaw, MD;  Location: [facility] OR;  Service: Neurosurgery;  Laterality: N/A;   TONSILLECTOMY     TOTAL KNEE ARTHROPLASTY Right 06/01/2015   Procedure: RIGHT TOTAL KNEE ARTHROPLASTY;  Surgeon: Kathryne Hitch, MD;  Location: [facility] ORS;  Service: Orthopedics;  Laterality: Right;  Block+general   VENTRICULOPERITONEAL SHUNT Right 08/08/2022   Procedure: LAP ASSITED SHUNT INSERTION VENTRICULAR-PERITONEAL;  Surgeon: Lisbeth Renshaw, MD;  Location: [facility] OR;  Service: Neurosurgery;  Laterality: Right;      NEUROSURGERY OPERATIVE NOTE    PREOP DIAGNOSIS:  Posterior fossa pseudomeningocele    POSTOP DIAGNOSIS: Same   PROCEDURE: Placement of lumbar drain Repair of posterior fossa pseudomeningocele   SURGEON: Dr. Lisbeth Renshaw, MD   ASSISTANT: None   ANESTHESIA: General Endotracheal   EBL: Minimal   SPECIMENS: None   DRAINS: Lumbar drain   COMPLICATIONS: None immediate   CONDITION: Hemodynamically stable to PACU   HISTORY: Aniken Stollings is a 62 y.o. male with a history of posterior fossa arteriovenous malformation who underwent posterior fossa craniectomy for resection approximately 3 months ago.  Patient has complained of generalized fatigue and gait instability with progressively enlarging pseudomeningocele at the operative site.  Although he did not have any significant improvement after high-volume lumbar puncture, we elected to proceed with placement of a lumbar drain and repair of the pseudomeningocele in hopes of improving his symptoms.  The risks, benefits, and alternatives to surgery were  all reviewed in detail with the patient and his wife.  After all questions were answered informed consent was obtained  and witnessed.     PROCEDURE IN DETAIL: The patient was brought to the operating room. After induction of general anesthesia, the patient was positioned on the operative table in the [place] head holder in the prone position. All pressure points were meticulously padded. Previous skin incision was then marked out and prepped and draped in the usual sterile fashion.   After timeout was conducted, the skin of the low back was prepped and draped.  Under AP fluoroscopy, the L4-5 interspace was identified and a Touhy needle was introduced.  Good clear flow of CSF was obtained.  A lumbar drain catheter was inserted to approximately 15 cm at the skin.  Position was confirmed with fluoroscopy.  Touhy needle was then removed and the drain was secured to the skin.   At this point, the posterior fossa incision was opened sharply.  Immediately underneath this identified a large pseudomeningocele pocket with CSF.  This was suctioned out.  I then was easily able to identify the previous posterior fossa craniectomy.  I identified 2 areas of dehiscent dura, at the level of the foramen magnum just above the level of C1, and more laterally on the patient's left side.  The remainder of the dura appeared to be somewhat adherent to the cerebellum underneath.  It became clear that primary closure of the dura would not be possible.  I therefore elected to place an inlay graft of collagen.  This was then covered with a sheet of bovine pericardial onlay graft.  Subsequent to this, I cover the region with polyethylene glycol sealant.  There is no active CSF leakage noted.  The suboccipital musculature was then reapproximated in multiple layers using a combination of interrupted 0 Vicryl stitches.  Skin was closed with interrupted 3-0 Vicryl stitches and the skin was closed with Dermabond.  Current Outpatient  Medications on File Prior to Visit  Medication Sig Dispense Refill   acetaminophen (TYLENOL) 325 MG tablet Take 2 tablets (650 mg total) by mouth 3 (three) times daily.     alendronate (FOSAMAX) 10 MG tablet Take 10 mg by mouth daily before breakfast. Take with a full glass of water on an empty stomach.     artificial tears (LACRILUBE) OINT ophthalmic ointment Place into both eyes 2 (two) times daily.     aspirin EC 81 MG tablet Take 1 tablet (81 mg total) by mouth daily. Swallow whole. 30 tablet 12   calcium carbonate (TUMS - DOSED IN MG ELEMENTAL CALCIUM) 500 MG chewable tablet Chew 1 tablet (200 mg of elemental calcium total) by mouth daily with supper.     Cholecalciferol (VITAMIN D3) 50 MCG (2000 UT) TABS Take 2,000 Units by mouth daily.     famotidine (PEPCID) 40 MG tablet 1 tablet Orally Once a day for 30 days     folic acid (FOLVITE) 1 MG tablet Take 1 mg by mouth daily.     methotrexate (RHEUMATREX) 2.5 MG tablet Take 20 mg by mouth once a week. Takes 8 tablets(20mg ) every Saturday.     Caution:Chemotherapy. Protect from light.     metoprolol succinate (TOPROL XL) 25 MG 24 hr tablet Take 1 tablet (25 mg total) by mouth daily. 90 tablet 3   Multiple Vitamins-Minerals (PRESERVISION AREDS 2 PO) Take 2 capsules by mouth daily.     predniSONE (STERAPRED UNI-PAK 21 TAB) 5 MG (21) TBPK tablet Take 5 mg by mouth every morning.     RITUXAN 500 MG/50ML injection See admin instructions. Every 6 months 2  dose infusion Ruxience     rosuvastatin (CRESTOR) 20 MG tablet TAKE 1 TABLET DAILY (DOSE INCREASE) 90 tablet 2   sertraline (ZOLOFT) 20 MG/ML concentrated solution Take 25 mg by mouth daily.     spironolactone (ALDACTONE) 25 MG tablet 12.5 mg. 1/2 tab daily     sulfaSALAzine (AZULFIDINE) 500 MG tablet Take 1,000 mg by mouth 2 (two) times daily.     traMADol (ULTRAM) 50 MG tablet Take 1 tablet (50 mg total) by mouth every 12 (twelve) hours as needed for moderate pain. (Patient taking differently:  Take 50 mg by mouth as needed for moderate pain (pain score 4-6).) 60 tablet 2   traZODone (DESYREL) 50 MG tablet TAKE 1.5 TABLETS (75 MG TOTAL) BY MOUTH AT BEDTIME AS NEEDED. FOR SLEEP 225 tablet 2   White Petrolatum-Mineral Oil (GENTEAL TEARS NIGHT-TIME OP) Apply to eye at bedtime.     No current facility-administered medications on file prior to visit.    Allergies  Allergen Reactions   Hydroxychloroquine     Other Reaction(s): cardiolmyopathy     DIAGNOSTIC DATA (LABS, IMAGING, TESTING) - I reviewed patient records, labs, notes, testing and imaging myself where available.  Lab Results  Component Value Date   WBC 8.1 08/27/2022   HGB 10.5 (L) 08/27/2022   HCT 33.7 (L) 08/27/2022   MCV 75.2 (L) 08/27/2022   PLT 192 08/27/2022      Component Value Date/Time   NA 141 08/27/2022 1910   K 3.6 08/27/2022 1910   CL 106 08/27/2022 1910   CO2 24 08/27/2022 1910   GLUCOSE 136 (H) 08/27/2022 1910   BUN 14 08/27/2022 1910   CREATININE 0.78 08/27/2022 1910   CALCIUM 9.3 08/27/2022 1910   PROT 7.2 08/27/2022 1910   ALBUMIN 3.7 08/27/2022 1910   AST 21 08/27/2022 1910   ALT 27 08/27/2022 1910   ALKPHOS 83 08/27/2022 1910   BILITOT 0.7 08/27/2022 1910   GFRNONAA >60 08/27/2022 1910   GFRAA >60 06/02/2015 0518   Lab Results  Component Value Date   CHOL 118 07/29/2022   HDL 42 07/29/2022   LDLCALC 57 07/29/2022   TRIG 97 07/29/2022   CHOLHDL 2.8 07/29/2022   Lab Results  Component Value Date   HGBA1C 6.0 (H) 07/29/2022   No results found for: "VITAMINB12" No results found for: "TSH"  PHYSICAL EXAM:  Today's Vitals   03/05/23 1505  BP: 134/79  Pulse: 80  Weight: 176 lb (79.8 kg)  Height: 6\' 2"  (1.88 m)   Body mass index is 22.6 kg/m.   Wt Readings from Last 3 Encounters:  03/05/23 176 lb (79.8 kg)  02/02/23 174 lb (78.9 kg)  11/13/22 181 lb 12.8 oz (82.5 kg)     Ht Readings from Last 3 Encounters:  03/05/23 6\' 2"  (1.88 m)  02/02/23 6\' 3"  (1.905 m)   10/20/22 6\' 3"  (1.905 m)      General: The patient is awake, alert and appears not in acute distress. THead: Normocephalic, atraumatic. Neck is supple.   Mallampati 2,  neck circumference:15.5  inches .  Nasal airflow not  patent.  Overbite is seen.  Dental status: intact  Cardiovascular:  Regular rate and cardiac rhythm by pulse,  without distended neck veins. Respiratory: Lungs are clear to auscultation.  Skin:  Without evidence of ankle edema, or rash. Trunk: The patient's posture is erect.   NEUROLOGIC EXAM: The patient is awake and alert, oriented to place and time.   Memory subjective described as  intact.  Attention span & concentration ability appears normal.  Speech is fluent,  without  dysarthria, and dysphonia - he has dysphagia with liquids.    Mood and affect are appropriate.   Cranial nerves: no loss of smell or taste reported  Pupils are equal and briskly reactive to light. Funduscopic exam deferred.  Extraocular movements in vertical and horizontal planes were intact and without nystagmus. No Diplopia. Visual fields by finger perimetry are intact. Hearing was intact to soft voice and finger rubbing.   Facial sensation intact to fine touch.  Facial motor strength is symmetric and tongue and uvula move midline.  Neck ROM : rotation, tilt and flexion extension were normal for age and shoulder shrug was symmetrical.    Motor exam:  lower tone and strength on the right arm and leg  Tone without cog wheeling, weakness of the right grip strength .   Sensory:    Coordination: Rapid alternating movements in the fingers/hands were of reduced speed. The Finger-to-nose maneuver with evidence of ataxia, dysmetria and tremor on the left .   Gait and station: Patient could rise unassisted from a seated position, walked with a cane.     ASSESSMENT AND PLAN 62 y.o. year old male  here  for SLEP MEDICINE CONSULTATION per [facility] physician  Dr Thelma Comp.     1) OSA by witnessed  symptoms , as reported by wife -  risk factors have increased-  the risk for obstruction is greater after a brain injury or stroke. .  Tongue tremor.   1) at risk for central asleep apnea by CHF,   2) ataxia tremor d following a cerebellar stroke,: I am not a movement disorder neurologist , nor vascular - but I would encourage his rehab physician to try medication for tremor , as these may improve sleep quality as well. Trazodone may not work, but it was worth trying. I would try a low dose of phenobarbitol.   3) rheumatoid arthritis treated on MAB and steroids.  Pain is  present 24 hors a day, this is a sleep impairing condition.   4) VP shunt is not affecting sleep.    I plan to follow up prn if the patient has sleep apnea by HST .   I do not treat chronic insomnia, neither do pulmonology sleep clinic, this is deferred to CBT.   I would like to thank Thana Ates, MD , Kerin Salen, DO, and Angelina Sheriff, Do [address] [place],  [place] [address] for allowing me to meet with and to take care of this pleasant patient.  After spending a total time of  50  minutes face to face and additional time for physical and neurologic examination, review of laboratory studies,  personal review of imaging studies, reports and results of other testing and review of referral information / records as far as provided in visit,   Electronically signed by: Melvyn Novas, MD 03/05/2023 3:27 PM  [facility] Board certified by The [employer] of Sleep Medicine and Diplomate of the [employer] of Sleep Medicine. Board certified In Neurology through the ABPN, Fellow of the [employer] of Neurology.

## 2023-03-11 ENCOUNTER — Ambulatory Visit: Payer: BC Managed Care – PPO | Admitting: Psychology

## 2023-03-11 DIAGNOSIS — F321 Major depressive disorder, single episode, moderate: Secondary | ICD-10-CM | POA: Diagnosis not present

## 2023-03-11 NOTE — Progress Notes (Signed)
[facility] Counselor Initial Adult Exam  Name: Christopher Yu Date: 03/11/2023 MRN: [id number] DOB: 23-Dec-1960 PCP: Thana Ates, MD    Guardian/Payee:  N/A    Paperwork requested: No   Reason for Visit Christopher Yu Problem: Adjustment to serious medical illness  Mental Status Exam: Appearance:   Casual     Behavior:  Appropriate  Motor:  Shuffling Gait  Speech/Language:   Slurred  Affect:  Depressed  Mood:  depressed  Thought process:  normal  Thought content:    WNL  Sensory/Perceptual disturbances:    WNL  Orientation:  oriented to person, place, and situation  Attention:  Good  Concentration:  Good  Memory:  WNL  Fund of knowledge:   Good  Insight:    Good  Judgment:   unknown  Impulse Control:  unknown    Reported Symptoms:  Sadness, helplessness and hopelessness  Risk Assessment: Danger to Self:  No Self-injurious Behavior: No Danger to Others: No Duty to Warn:no Physical Aggression / Violence:No  Access to Firearms a concern:  Unknown Gang Involvement:No  Patient / guardian was educated about steps to take if suicide or homicide risk level increases between visits: n/a While future psychiatric events cannot be accurately predicted, the patient does not currently require acute inpatient psychiatric care and does not currently meet [facility] involuntary commitment criteria.  Substance Abuse History: Current substance abuse: No     Past Psychiatric History:   No previous psychological problems have been observed Outpatient Providers:N/A History of Psych Hospitalization: No  Psychological Testing:  none    Abuse History:  Victim  of:   N/A   Report needed: No. Victim of Neglect:No. Perpetrator of  N/A   Witness / Exposure to Domestic Violence: No   Protective Services Involvement: No  Witness to [employer] Violence:  No   Family History:  Family History  Problem Relation Age of Onset   Other Father        MVA   Lupus Sister     Living situation: the patient lives with their family  Sexual Orientation: Straight  Relationship Status: married  Name of spouse Christopher Yu If a parent, number of children / ages:Two girls, ages 63 and 14  Support Systems: spouse  Surveyor, quantity Stress:  Yes   Income/Employment/Disability: Software engineer:  unknown  Educational History: Education:  unknown  Oncologist: unknown  Any cultural differences that may affect / interfere with treatment:  not applicable   Recreation/Hobbies: golfing  Stressors: Financial difficulties   Health problems    Strengths: Family  Barriers:  severe physical limitations that  compromises activity   Legal History: Pending legal issue / charges: The patient has no significant history of legal issues. History of legal issue / charges:  N/A  Medical History/Surgical History: reviewed Past Medical History:  Diagnosis Date   Anxiety    Arthritis, rheumatoid (HCC)    dx 1988   CAD (coronary artery disease), native coronary artery    Mild LAD disease with calcification noted at cath 12//27/16    Cardiomyopathy (HCC)    Cardiomyopathy (HCC)    Chronic systolic heart failure (HCC) 05/01/2015   Depression    Deviated nasal septum 06/25/2011   Headache    High cholesterol    Hyperlipidemia    Impingement syndrome of left shoulder 12/22/2017   Macular degeneration    Nausea and vomiting 08/28/2022   Rheumatoid aortitis    Sleep apnea    does not use cpap - UNABLE TO TOLERATE MASK   Sleep apnea in adult    deviated septum repaired, most recent sleep study was negative   Status post total right  knee replacement 06/01/2015    Past Surgical History:  Procedure Laterality Date   ANKLE FUSION Right 05/05/2012   related to his arthritis   ANKLE FUSION Left 05/25/2013   related to his arthritis   ANKLE SURGERY Bilateral    APPLICATION OF CRANIAL NAVIGATION N/A 04/11/2022   Procedure: APPLICATION OF CRANIAL NAVIGATION;  Surgeon: Lisbeth Renshaw, MD;  Location: [facility] OR;  Service: Neurosurgery;  Laterality: N/A;   APPLICATION OF CRANIAL NAVIGATION N/A 08/08/2022   Procedure: APPLICATION OF CRANIAL NAVIGATION;  Surgeon: Lisbeth Renshaw, MD;  Location: [facility] OR;  Service: Neurosurgery;  Laterality: N/A;   CARDIAC CATHETERIZATION N/A 05/01/2015   Procedure: Left Heart Cath and Coronary Angiography;  Surgeon: Lyn Records, MD;  Location: [facility] INVASIVE CV LAB;  Service: Cardiovascular;  Laterality: N/A;   CRANIOTOMY N/A 04/11/2022   Procedure: STEREOTACTIC SUBOCCIPITAL CRANIOTOMY FOR RESECTION OF ARTERIO-VENOUS MALFORMATION;  Surgeon: Lisbeth Renshaw, MD;  Location: [facility] OR;  Service: Neurosurgery;  Laterality: N/A;   FRACTURE SURGERY     left femur fracture x 3   IR ANGIO EXTERNAL CAROTID SEL EXT CAROTID BILAT MOD SED  01/28/2022   IR ANGIO INTRA EXTRACRAN SEL INTERNAL CAROTID BILAT MOD SED  01/28/2022   IR ANGIO INTRA EXTRACRAN SEL INTERNAL CAROTID BILAT MOD SED  04/16/2022   IR ANGIO VERTEBRAL SEL VERTEBRAL BILAT MOD SED  01/28/2022   IR ANGIO VERTEBRAL SEL VERTEBRAL BILAT MOD SED  04/16/2022   KNEE ARTHROSCOPY     right x 4   LAPAROSCOPIC REVISION VENTRICULAR-PERITONEAL (V-P) SHUNT N/A 08/08/2022   Procedure: LAPAROSCOPIC VENTRICULAR-PERITONEAL (V-P) SHUNT;  Surgeon: Harriette Bouillon, MD;  Location: [facility] OR;  Service: General;  Laterality: N/A;   LUMBAR LAMINECTOMY/DECOMPRESSION MICRODISCECTOMY N/A 07/18/2022   Procedure: Repair of Pseudomeningiocele posterior;  Surgeon: Lisbeth Renshaw, MD;  Location: [facility] OR;  Service: Neurosurgery;  Laterality: N/A;   NASAL SEPTOPLASTY W/ TURBINOPLASTY   06/25/2011   Procedure: NASAL SEPTOPLASTY WITH TURBINATE REDUCTION;  Surgeon: Osborn Coho, MD;  Location: [facility] OR;  Service: ENT;  Laterality: Bilateral;   PLACEMENT OF LUMBAR DRAIN N/A 07/18/2022   Procedure: PLACEMENT OF LUMBAR DRAIN;  Surgeon: Lisbeth Renshaw, MD;  Location: [facility] OR;  Service: Neurosurgery;  Laterality: N/A;   TONSILLECTOMY     TOTAL KNEE ARTHROPLASTY Right 06/01/2015   Procedure: RIGHT TOTAL KNEE ARTHROPLASTY;  Surgeon: Kathryne Hitch, MD;  Location: [facility] ORS;  Service: Orthopedics;  Laterality: Right;  Block+general   VENTRICULOPERITONEAL  SHUNT Right 08/08/2022   Procedure: LAP ASSITED SHUNT INSERTION VENTRICULAR-PERITONEAL;  Surgeon: Lisbeth Renshaw, MD;  Location: [facility] OR;  Service: Neurosurgery;  Laterality: Right;    Medications: Current Outpatient Medications  Medication Sig Dispense Refill   acetaminophen (TYLENOL) 325 MG tablet Take 2 tablets (650 mg total) by mouth 3 (three) times daily.     alendronate (FOSAMAX) 10 MG tablet Take 10 mg by mouth daily before breakfast. Take with a full glass of water on an empty stomach.     artificial tears (LACRILUBE) OINT ophthalmic ointment Place into both eyes 2 (two) times daily.     aspirin EC 81 MG tablet Take 1 tablet (81 mg total) by mouth daily. Swallow whole. 30 tablet 12   calcium carbonate (TUMS - DOSED IN MG ELEMENTAL CALCIUM) 500 MG chewable tablet Chew 1 tablet (200 mg of elemental calcium total) by mouth daily with supper.     Cholecalciferol (VITAMIN D3) 50 MCG (2000 UT) TABS Take 2,000 Units by mouth daily.     famotidine (PEPCID) 40 MG tablet 1 tablet Orally Once a day for 30 days     folic acid (FOLVITE) 1 MG tablet Take 1 mg by mouth daily.     methotrexate (RHEUMATREX) 2.5 MG tablet Take 20 mg by mouth once a week. Takes 8 tablets(20mg ) every Saturday.     Caution:Chemotherapy. Protect from light.     metoprolol succinate (TOPROL XL) 25 MG 24 hr tablet Take 1 tablet (25 mg total) by mouth daily. 90  tablet 3   Multiple Vitamins-Minerals (PRESERVISION AREDS 2 PO) Take 2 capsules by mouth daily.     predniSONE (STERAPRED UNI-PAK 21 TAB) 5 MG (21) TBPK tablet Take 5 mg by mouth every morning.     RITUXAN 500 MG/50ML injection See admin instructions. Every 6 months 2 dose infusion Ruxience     rosuvastatin (CRESTOR) 20 MG tablet TAKE 1 TABLET DAILY (DOSE INCREASE) 90 tablet 2   sertraline (ZOLOFT) 20 MG/ML concentrated solution Take 25 mg by mouth daily.     spironolactone (ALDACTONE) 25 MG tablet 12.5 mg. 1/2 tab daily     sulfaSALAzine (AZULFIDINE) 500 MG tablet Take 1,000 mg by mouth 2 (two) times daily.     traMADol (ULTRAM) 50 MG tablet Take 1 tablet (50 mg total) by mouth every 12 (twelve) hours as needed for moderate pain. (Patient taking differently: Take 50 mg by mouth as needed for moderate pain (pain score 4-6).) 60 tablet 2   traZODone (DESYREL) 50 MG tablet TAKE 1.5 TABLETS (75 MG TOTAL) BY MOUTH AT BEDTIME AS NEEDED. FOR SLEEP 225 tablet 2   White Petrolatum-Mineral Oil (GENTEAL TEARS NIGHT-TIME OP) Apply to eye at bedtime.     No current facility-administered medications for this visit.    Allergies  Allergen Reactions   Hydroxychloroquine     Other Reaction(s): cardiolmyopathy    Diagnoses:  Major depression  Plan of Care: Outpatient psychotherapy and medication management as needed. Initial Note: (With wife Christopher Yu). She says that she has hoped Christopher Yu would get into therapy. He has Ataxic Tremor. He told Dr. Arbutus Leas that he would like medication to just sit and do nothing. He says he always had golf to rey on and now cannot play. In December, he went n for a surgery to remove an avm from brain. Was told it was a routine surgery and was told that he needs to get it out. Was told it would be a short recovery and he would be  back to work. 1 1/2 hour surgery turned into 6 hour surgery and he had complications. He had extra cerebral fluid and Dr. York Spaniel it would absorb and just wait. He  kept declining and by March, they went in to try and drain the extra fluid. He was in hospital for a week. Wife noticed that during the hospital stay, his speech declined and he was clearly weak. Nurses were trying to discharge him and she protested, but they sent him home anyway. He declined, and could not sit, stand, talk or walk and she ended up calling ambulance. In ER, she ws told he had a stroke. He was in ER 24 hours before getting a room in Neuro area. Head of stroke team came in and said it was not a stroke, but complications from surgery. She was blamed by medical team for not "speaking up" before being initially discharged. He ended up in a rehab for 2 weeks and told them he needed another surgery. Was told he had hydrocephalus and need a shunt to drain liquid off his brain. She felt a lot of pressure from team that he needed surgery. She wanted a second opinion and had rouble getting it. Spoke to another Dr. and they decided to get the third surgery. He was in OT,PT and Speech Therapy. During therapy, he started having a tremor. They ended up being referred to Dr. Arbutus Leas, who has now told him that treating his tremor is tricky and will take time.  Christopher Yu is no longer in OT,PT speech therapy. He is supposed to be doing exercises on his own, but has not been emotionally able to ge himself to do it. He has persistent headaches as well. Just had a CAT Scan and awaiting the results. His headaches are worse and they are concerned. He also has rhumatory arthritis and it is flaring up because he had to stop meds. He has cardiomyopathy, macular degeneration on top of all of these other conditions.  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 He has been approved for disability and are working to get social security disability. She works part time. They have 2 daughters, one with diabetes and arthritis. She is heading to [employer] this Fall and wants to  work in Research scientist (physical sciences). Other daughter is 29 and is "our introvert with lots of anxiety". She is in therapy and has mental health issues that is being treated. She will be a Holiday representative at the middle college at [employer].  They are told that medicine will not control his condition, but they will have to try to retrain his brain. Suggested that they work to get back into OT and PT. She has a hard time motivating him. He admits that he keeps things "inside". She says he is not a good comunicator. He will not share things like if he is in pain or hurts himself.   Tx. Plan/Goals: Patient reports depressive symptoms secondary to the medical trauma he has suffered and the debilitating physical results. He is seeking therapy to resolve his feelings of helplessness and hopelessness. Also want assistance in improving his drive/motivation in daily activity. Will engage in therapy that utilizes behavioral approaches to mitigate symptoms along with insight oriented counseling. Will also incorporate some conjoint sessions with spouse as needed, given her integral involvement with his care. Patient agrees with plan for treatment. Goal Date is 12-24    Patient agrees to a video  session and is aware of the limitations of this platform. He is at home and provider is in his office.   Session Note:  Patient states he is bored and not doing much at all. He is trying to get out more, but his wife does not want him to drive. He feels he is ready, but she disagrees.cussed the need for greater mobility and for him to get out of house more often. He does plan to see golf buddies on Saturday. I has been cancelled twice. He is bored and needs more activity. The sedentary life is contributing to his depression. I spoke with Dr. Arbutus Leas OA:[id number] and she confirmed that there is no treatmen. Will also talk with her about him getting cleared to drive.                                Garrel Ridgel, PhD 4:15p-5:00 45 minutes

## 2023-03-16 DIAGNOSIS — Q282 Arteriovenous malformation of cerebral vessels: Secondary | ICD-10-CM | POA: Diagnosis not present

## 2023-03-16 DIAGNOSIS — G919 Hydrocephalus, unspecified: Secondary | ICD-10-CM | POA: Diagnosis not present

## 2023-03-18 ENCOUNTER — Ambulatory Visit: Payer: BC Managed Care – PPO | Admitting: Psychology

## 2023-03-18 ENCOUNTER — Telehealth: Payer: Self-pay | Admitting: Neurology

## 2023-03-18 DIAGNOSIS — F329 Major depressive disorder, single episode, unspecified: Secondary | ICD-10-CM | POA: Diagnosis not present

## 2023-03-18 DIAGNOSIS — F321 Major depressive disorder, single episode, moderate: Secondary | ICD-10-CM

## 2023-03-18 NOTE — Telephone Encounter (Signed)
[facility]- [employer] pending faxed notes

## 2023-03-18 NOTE — Progress Notes (Signed)
[facility] Counselor Initial Adult Exam  Name: Christopher Yu Date: 03/18/2023 MRN: [id number] DOB: [birth year] PCP: Thana Ates, MD    Guardian/Payee:  N/A    Paperwork requested: No   Reason for Visit Christopher Yu Problem: Adjustment to serious medical illness  Mental Status Exam: Appearance:   Casual     Behavior:  Appropriate  Motor:  Shuffling Gait  Speech/Language:   Slurred  Affect:  Depressed  Mood:  depressed  Thought process:  normal  Thought content:    WNL  Sensory/Perceptual disturbances:    WNL  Orientation:  oriented to person, place, and situation  Attention:  Good  Concentration:  Good  Memory:  WNL  Fund of knowledge:   Good  Insight:    Good  Judgment:   unknown  Impulse Control:  unknown    Reported Symptoms:  Sadness, helplessness and hopelessness  Risk Assessment: Danger to Self:  No Self-injurious Behavior: No Danger to Others: No Duty to Warn:no Physical Aggression / Violence:No  Access to Firearms a concern:  Unknown Gang Involvement:No  Patient / guardian was educated about steps to take if suicide or homicide risk level increases between visits: n/a While future psychiatric events cannot be accurately predicted, the patient does not currently require acute inpatient psychiatric care and does not currently meet [facility] involuntary commitment criteria.  Substance Abuse History: Current substance abuse: No     Past Psychiatric History:   No previous psychological problems have been observed Outpatient Providers:N/A History of Psych Hospitalization: No  Psychological Testing:   none    Abuse History:  Victim of:   N/A   Report needed: No. Victim of Neglect:No. Perpetrator of  N/A   Witness / Exposure to Domestic Violence: No   Protective Services Involvement: No  Witness to [employer] Violence:  No   Family History:  Family History  Problem Relation Age of Onset   Other Father        MVA   Lupus Sister     Living situation: the patient lives with their family  Sexual Orientation: Straight  Relationship Status: married  Name of spouse [name] If a parent, number of children / ages:Two girls, ages 24 and 39  Support Systems: spouse  Surveyor, quantity Stress:  Yes   Income/Employment/Disability: Software engineer:  unknown  Educational History: Education:  unknown  Religion/Sprituality/World View: unknown  Any cultural differences that may affect / interfere with treatment:  not applicable   Recreation/Hobbies: golfing  Stressors: Financial difficulties  Health problems    Strengths: Family  Barriers:  severe physical limitations that compromises activity   Legal History: Pending legal issue / charges: The patient has no significant history of legal issues. History of legal issue / charges:  N/A  Medical History/Surgical History: reviewed Past Medical History:  Diagnosis Date   Anxiety    Arthritis, rheumatoid (HCC)    dx 1988   CAD (coronary artery disease), native coronary artery    Mild LAD disease with calcification noted at cath 12//27/16    Cardiomyopathy (HCC)    Cardiomyopathy (HCC)    Chronic systolic heart failure (HCC) 05/01/2015   Depression    Deviated nasal septum 06/25/2011   Headache    High cholesterol    Hyperlipidemia    Impingement syndrome of left shoulder 12/22/2017   Macular degeneration    Nausea and vomiting 08/28/2022   Rheumatoid aortitis    Sleep apnea    does not use cpap - UNABLE TO TOLERATE MASK   Sleep apnea in adult    deviated septum repaired, most recent sleep study was  negative   Status post total right knee replacement 06/01/2015    Past Surgical History:  Procedure Laterality Date   ANKLE FUSION Right 05/05/2012   related to his arthritis   ANKLE FUSION Left 05/25/2013   related to his arthritis   ANKLE SURGERY Bilateral    APPLICATION OF CRANIAL NAVIGATION N/A 04/11/2022   Procedure: APPLICATION OF CRANIAL NAVIGATION;  Surgeon: Lisbeth Renshaw, MD;  Location: [facility] OR;  Service: Neurosurgery;  Laterality: N/A;   APPLICATION OF CRANIAL NAVIGATION N/A 08/08/2022   Procedure: APPLICATION OF CRANIAL NAVIGATION;  Surgeon: Lisbeth Renshaw, MD;  Location: [facility] OR;  Service: Neurosurgery;  Laterality: N/A;   CARDIAC CATHETERIZATION N/A 05/01/2015   Procedure: Left Heart Cath and Coronary Angiography;  Surgeon: Lyn Records, MD;  Location: [facility] INVASIVE CV LAB;  Service: Cardiovascular;  Laterality: N/A;   CRANIOTOMY N/A 04/11/2022   Procedure: STEREOTACTIC SUBOCCIPITAL CRANIOTOMY FOR RESECTION OF ARTERIO-VENOUS MALFORMATION;  Surgeon: Lisbeth Renshaw, MD;  Location: [facility] OR;  Service: Neurosurgery;  Laterality: N/A;   FRACTURE SURGERY     left femur fracture x 3   IR ANGIO EXTERNAL CAROTID SEL EXT CAROTID BILAT MOD SED  01/28/2022   IR ANGIO INTRA EXTRACRAN SEL INTERNAL CAROTID BILAT MOD SED  01/28/2022   IR ANGIO INTRA EXTRACRAN SEL INTERNAL CAROTID BILAT MOD SED  04/16/2022   IR ANGIO VERTEBRAL SEL VERTEBRAL BILAT MOD SED  01/28/2022   IR ANGIO VERTEBRAL SEL VERTEBRAL BILAT MOD SED  04/16/2022   KNEE ARTHROSCOPY     right x 4   LAPAROSCOPIC REVISION VENTRICULAR-PERITONEAL (V-P) SHUNT N/A 08/08/2022   Procedure: LAPAROSCOPIC VENTRICULAR-PERITONEAL (V-P) SHUNT;  Surgeon: Harriette Bouillon, MD;  Location: [facility] OR;  Service: General;  Laterality: N/A;   LUMBAR LAMINECTOMY/DECOMPRESSION MICRODISCECTOMY N/A 07/18/2022   Procedure: Repair of Pseudomeningiocele posterior;  Surgeon: Lisbeth Renshaw, MD;  Location: [facility] OR;  Service: Neurosurgery;  Laterality: N/A;    NASAL SEPTOPLASTY W/ TURBINOPLASTY  06/25/2011   Procedure: NASAL SEPTOPLASTY WITH TURBINATE REDUCTION;  Surgeon: Osborn Coho, MD;  Location: [facility] OR;  Service: ENT;  Laterality: Bilateral;   PLACEMENT OF LUMBAR DRAIN N/A 07/18/2022   Procedure: PLACEMENT OF LUMBAR DRAIN;  Surgeon: Lisbeth Renshaw, MD;  Location: [facility] OR;  Service: Neurosurgery;  Laterality: N/A;   TONSILLECTOMY     TOTAL KNEE ARTHROPLASTY Right 06/01/2015   Procedure: RIGHT TOTAL KNEE ARTHROPLASTY;  Surgeon: Kathryne Hitch, MD;  Location: [facility] ORS;  Service: Orthopedics;  Laterality: Right;  Block+general   VENTRICULOPERITONEAL SHUNT Right 08/08/2022   Procedure: LAP ASSITED SHUNT INSERTION VENTRICULAR-PERITONEAL;  Surgeon: Lisbeth Renshaw, MD;  Location: [facility] OR;  Service: Neurosurgery;  Laterality: Right;    Medications: Current Outpatient Medications  Medication Sig Dispense Refill   acetaminophen (TYLENOL) 325 MG tablet Take 2 tablets (650 mg total) by mouth 3 (three) times daily.     alendronate (FOSAMAX) 10 MG tablet Take 10 mg by mouth daily before breakfast. Take with a full glass of water on an empty stomach.     artificial tears (LACRILUBE) OINT ophthalmic ointment Place into both eyes 2 (two) times daily.     aspirin EC 81 MG tablet Take 1 tablet (81 mg total) by mouth daily. Swallow whole. 30 tablet 12   calcium carbonate (TUMS - DOSED IN MG ELEMENTAL CALCIUM) 500 MG chewable tablet Chew 1 tablet (200 mg of elemental calcium total) by mouth daily with supper.     Cholecalciferol (VITAMIN D3) 50 MCG (2000 UT) TABS Take 2,000 Units by mouth daily.     famotidine (PEPCID) 40 MG tablet 1 tablet Orally Once a day for 30 days     folic acid (FOLVITE) 1 MG tablet Take 1 mg by mouth daily.     methotrexate (RHEUMATREX) 2.5 MG tablet Take 20 mg by mouth once a week. Takes 8 tablets(20mg ) every Saturday.     Caution:Chemotherapy. Protect from light.     metoprolol succinate (TOPROL XL) 25 MG 24 hr tablet Take 1  tablet (25 mg total) by mouth daily. 90 tablet 3   Multiple Vitamins-Minerals (PRESERVISION AREDS 2 PO) Take 2 capsules by mouth daily.     predniSONE (STERAPRED UNI-PAK 21 TAB) 5 MG (21) TBPK tablet Take 5 mg by mouth every morning.     RITUXAN 500 MG/50ML injection See admin instructions. Every 6 months 2 dose infusion Ruxience     rosuvastatin (CRESTOR) 20 MG tablet TAKE 1 TABLET DAILY (DOSE INCREASE) 90 tablet 2   sertraline (ZOLOFT) 20 MG/ML concentrated solution Take 25 mg by mouth daily.     spironolactone (ALDACTONE) 25 MG tablet 12.5 mg. 1/2 tab daily     sulfaSALAzine (AZULFIDINE) 500 MG tablet Take 1,000 mg by mouth 2 (two) times daily.     traMADol (ULTRAM) 50 MG tablet Take 1 tablet (50 mg total) by mouth every 12 (twelve) hours as needed for moderate pain. (Patient taking differently: Take 50 mg by mouth as needed for moderate pain (pain score 4-6).) 60 tablet 2   traZODone (DESYREL) 50 MG tablet TAKE 1.5 TABLETS (75 MG TOTAL) BY MOUTH AT BEDTIME AS NEEDED. FOR SLEEP 225 tablet 2   White Petrolatum-Mineral Oil (GENTEAL TEARS NIGHT-TIME OP) Apply to eye at bedtime.     No current facility-administered medications for this visit.    Allergies  Allergen Reactions   Hydroxychloroquine     Other Reaction(s): cardiolmyopathy    Diagnoses:  Major depression  Plan of Care: Outpatient psychotherapy and medication management as needed. Initial Note: (With wife [name]). She says that she has hoped Christopher Yu would get into therapy. He has Ataxic Tremor. He told Dr. Arbutus Leas that he would like medication to just sit and do nothing. He says he always had golf to rey on and now cannot play. In December, he went n for a surgery to remove an avm from brain. Was told it was a routine surgery and was told that he needs to get  it out. Was told it would be a short recovery and he would be back to work. 1 1/2 hour surgery turned into 6 hour surgery and he had complications. He had extra cerebral fluid and  Dr. York Spaniel it would absorb and just wait. He kept declining and by March, they went in to try and drain the extra fluid. He was in hospital for a week. Wife noticed that during the hospital stay, his speech declined and he was clearly weak. Nurses were trying to discharge him and she protested, but they sent him home anyway. He declined, and could not sit, stand, talk or walk and she ended up calling ambulance. In ER, she ws told he had a stroke. He was in ER 24 hours before getting a room in Neuro area. Head of stroke team came in and said it was not a stroke, but complications from surgery. She was blamed by medical team for not "speaking up" before being initially discharged. He ended up in a rehab for 2 weeks and told them he needed another surgery. Was told he had hydrocephalus and need a shunt to drain liquid off his brain. She felt a lot of pressure from team that he needed surgery. She wanted a second opinion and had rouble getting it. Spoke to another Dr. and they decided to get the third surgery. He was in OT,PT and Speech Therapy. During therapy, he started having a tremor. They ended up being referred to Dr. Arbutus Leas, who has now told him that treating his tremor is tricky and will take time.  Christopher Yu is no longer in OT,PT speech therapy. He is supposed to be doing exercises on his own, but has not been emotionally able to ge himself to do it. He has persistent headaches as well. Just had a CAT Scan and awaiting the results. His headaches are worse and they are concerned. He also has rhumatory arthritis and it is flaring up because he had to stop meds. He has cardiomyopathy, macular degeneration on top of all of these other conditions.  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 He has been approved for disability and are working to get social security disability. She works part time. They have 2 daughters, one with diabetes and arthritis. She is  heading to [employer] this Fall and wants to work in Research scientist (physical sciences). Other daughter is 58 and is "our introvert with lots of anxiety". She is in therapy and has mental health issues that is being treated. She will be a Holiday representative at the middle college at [employer].  They are told that medicine will not control his condition, but they will have to try to retrain his brain. Suggested that they work to get back into OT and PT. She has a hard time motivating him. He admits that he keeps things "inside". She says he is not a good comunicator. He will not share things like if he is in pain or hurts himself.   Tx. Plan/Goals: Patient reports depressive symptoms secondary to the medical trauma he has suffered and the debilitating physical results. He is seeking therapy to resolve his feelings of helplessness and hopelessness. Also want assistance in improving his drive/motivation in daily activity. Will engage in therapy that utilizes behavioral approaches to mitigate symptoms along with insight oriented counseling. Will also incorporate some conjoint sessions with spouse as needed, given her integral involvement with his care. Patient agrees with plan  for treatment. Goal Date is 12-24    Patient agrees to be seen in provider's office.   Session Note:  Patient states he is "good", but that he continues to be bored. He went to his doctor on Monday and he told Christopher Yu that he has a few months left of recovery and that he should then be god. This is to say he will have reached his new normal. This is not what he expected. He talked about regretting having his initial medical procedure that caused his current state. He says his cognitive abilities and communication abilities are too compromised. Says that people sometimes tell him that his thought process is hard to follow and erratic.  He played 18 holes last week. Says he "sucks" but will try to play again this weekend. He said he derives no satisfaction in playing because he  is not good. Only redeeming part is hanging with his friends. We talked about the need to redefine what is gratifying for him and the need to continue to make every effort to keep improving. Acceptance is the key to being satisfied.                                Garrel Ridgel, PhD 4:15p-5:00 45 minutes

## 2023-03-25 ENCOUNTER — Ambulatory Visit: Payer: BC Managed Care – PPO | Admitting: Psychology

## 2023-03-25 DIAGNOSIS — F321 Major depressive disorder, single episode, moderate: Secondary | ICD-10-CM

## 2023-03-25 DIAGNOSIS — F329 Major depressive disorder, single episode, unspecified: Secondary | ICD-10-CM | POA: Diagnosis not present

## 2023-03-25 NOTE — Progress Notes (Signed)
[facility] Counselor Initial Adult Exam  Name: Christopher Yu Date: 03/25/2023 MRN: [id number] DOB: 02-12-1961 PCP: Thana Ates, MD    Guardian/Payee:  N/A    Paperwork requested: No   Reason for Visit Christopher Yu Problem: Adjustment to serious medical illness  Mental Status Exam: Appearance:   Casual     Behavior:  Appropriate  Motor:  Shuffling Gait  Speech/Language:   Slurred  Affect:  Depressed  Mood:  depressed  Thought process:  normal  Thought content:    WNL  Sensory/Perceptual disturbances:    WNL  Orientation:  oriented to person, place, and situation  Attention:  Good  Concentration:  Good  Memory:  WNL  Fund of knowledge:   Good  Insight:    Good  Judgment:   unknown  Impulse Control:  unknown    Reported Symptoms:  Sadness, helplessness and hopelessness  Risk Assessment: Danger to Self:  No Self-injurious Behavior: No Danger to Others: No Duty to Warn:no Physical Aggression / Violence:No  Access to Firearms a concern:  Unknown Gang Involvement:No  Patient / guardian was educated about steps to take if suicide or homicide risk level increases between visits: n/a While future psychiatric events cannot be accurately predicted, the patient does not currently require acute inpatient psychiatric care and does not currently meet [facility] involuntary commitment criteria.  Substance Abuse History: Current substance abuse: No     Past Psychiatric History:   No previous psychological problems have been observed Outpatient Providers:N/A History of Psych Hospitalization: No   Psychological Testing:  none    Abuse History:  Victim of:   N/A   Report needed: No. Victim of Neglect:No. Perpetrator of  N/A   Witness / Exposure to Domestic Violence: No   Protective Services Involvement: No  Witness to [employer] Violence:  No   Family History:  Family History  Problem Relation Age of Onset   Other Father        MVA   Lupus Sister     Living situation: the patient lives with their family  Sexual Orientation: Straight  Relationship Status: married  Name of spouse [name] If a parent, number of children / ages:Two girls, ages 40 and 86  Support Systems: spouse  Surveyor, quantity Stress:  Yes   Income/Employment/Disability: Software engineer:  unknown  Educational History: Education:  unknown  Religion/Sprituality/World View: unknown  Any cultural differences that may affect /  interfere with treatment:  not applicable   Recreation/Hobbies: golfing  Stressors: Financial difficulties   Health problems    Strengths: Family  Barriers:  severe physical limitations that compromises activity   Legal History: Pending legal issue / charges: The patient has no significant history of legal issues. History of legal issue / charges:  N/A  Medical History/Surgical History: reviewed Past Medical History:  Diagnosis Date   Anxiety    Arthritis, rheumatoid (HCC)    dx 1988   CAD (coronary artery disease), native coronary artery    Mild LAD disease with calcification noted at cath 12//27/16    Cardiomyopathy (HCC)    Cardiomyopathy (HCC)    Chronic systolic heart failure (HCC) 05/01/2015   Depression    Deviated nasal septum 06/25/2011   Headache    High cholesterol    Hyperlipidemia    Impingement syndrome of left shoulder 12/22/2017   Macular degeneration    Nausea and vomiting 08/28/2022   Rheumatoid aortitis    Sleep apnea    does not use cpap - UNABLE TO TOLERATE MASK   Sleep apnea in adult    deviated septum repaired, most  recent sleep study was negative   Status post total right knee replacement 06/01/2015    Past Surgical History:  Procedure Laterality Date   ANKLE FUSION Right 05/05/2012   related to his arthritis   ANKLE FUSION Left 05/25/2013   related to his arthritis   ANKLE SURGERY Bilateral    APPLICATION OF CRANIAL NAVIGATION N/A 04/11/2022   Procedure: APPLICATION OF CRANIAL NAVIGATION;  Surgeon: Lisbeth Renshaw, MD;  Location: [facility] OR;  Service: Neurosurgery;  Laterality: N/A;   APPLICATION OF CRANIAL NAVIGATION N/A 08/08/2022   Procedure: APPLICATION OF CRANIAL NAVIGATION;  Surgeon: Lisbeth Renshaw, MD;  Location: [facility] OR;  Service: Neurosurgery;  Laterality: N/A;   CARDIAC CATHETERIZATION N/A 05/01/2015   Procedure: Left Heart Cath and Coronary Angiography;  Surgeon: Lyn Records, MD;  Location: [facility] INVASIVE CV LAB;  Service: Cardiovascular;  Laterality: N/A;   CRANIOTOMY N/A 04/11/2022   Procedure: STEREOTACTIC SUBOCCIPITAL CRANIOTOMY FOR RESECTION OF ARTERIO-VENOUS MALFORMATION;  Surgeon: Lisbeth Renshaw, MD;  Location: [facility] OR;  Service: Neurosurgery;  Laterality: N/A;   FRACTURE SURGERY     left femur fracture x 3   IR ANGIO EXTERNAL CAROTID SEL EXT CAROTID BILAT MOD SED  01/28/2022   IR ANGIO INTRA EXTRACRAN SEL INTERNAL CAROTID BILAT MOD SED  01/28/2022   IR ANGIO INTRA EXTRACRAN SEL INTERNAL CAROTID BILAT MOD SED  04/16/2022   IR ANGIO VERTEBRAL SEL VERTEBRAL BILAT MOD SED  01/28/2022   IR ANGIO VERTEBRAL SEL VERTEBRAL BILAT MOD SED  04/16/2022   KNEE ARTHROSCOPY     right x 4   LAPAROSCOPIC REVISION VENTRICULAR-PERITONEAL (V-P) SHUNT N/A 08/08/2022   Procedure: LAPAROSCOPIC VENTRICULAR-PERITONEAL (V-P) SHUNT;  Surgeon: Harriette Bouillon, MD;  Location: [facility] OR;  Service: General;  Laterality: N/A;   LUMBAR LAMINECTOMY/DECOMPRESSION MICRODISCECTOMY N/A 07/18/2022   Procedure: Repair of Pseudomeningiocele posterior;  Surgeon: Lisbeth Renshaw, MD;  Location: [facility] OR;  Service: Neurosurgery;   Laterality: N/A;   NASAL SEPTOPLASTY W/ TURBINOPLASTY  06/25/2011   Procedure: NASAL SEPTOPLASTY WITH TURBINATE REDUCTION;  Surgeon: Osborn Coho, MD;  Location[facility] OR;  Service: ENT;  Laterality: Bilateral;   PLACEMENT OF LUMBAR DRAIN N/A 07/18/2022   Procedure: PLACEMENT OF LUMBAR DRAIN;  Surgeon: Lisbeth Renshaw, MD;  Location: [facility] OR;  Service: Neurosurgery;  Laterality: N/A;   TONSILLECTOMY     TOTAL KNEE ARTHROPLASTY  Right 06/01/2015   Procedure: RIGHT TOTAL KNEE ARTHROPLASTY;  Surgeon: Kathryne Hitch, MD;  Location: [facility] ORS;  Service: Orthopedics;  Laterality: Right;  Block+general   VENTRICULOPERITONEAL SHUNT Right 08/08/2022   Procedure: LAP ASSITED SHUNT INSERTION VENTRICULAR-PERITONEAL;  Surgeon: Lisbeth Renshaw, MD;  Location: [facility] OR;  Service: Neurosurgery;  Laterality: Right;    Medications: Current Outpatient Medications  Medication Sig Dispense Refill   acetaminophen (TYLENOL) 325 MG tablet Take 2 tablets (650 mg total) by mouth 3 (three) times daily.     alendronate (FOSAMAX) 10 MG tablet Take 10 mg by mouth daily before breakfast. Take with a full glass of water on an empty stomach.     artificial tears (LACRILUBE) OINT ophthalmic ointment Place into both eyes 2 (two) times daily.     aspirin EC 81 MG tablet Take 1 tablet (81 mg total) by mouth daily. Swallow whole. 30 tablet 12   calcium carbonate (TUMS - DOSED IN MG ELEMENTAL CALCIUM) 500 MG chewable tablet Chew 1 tablet (200 mg of elemental calcium total) by mouth daily with supper.     Cholecalciferol (VITAMIN D3) 50 MCG (2000 UT) TABS Take 2,000 Units by mouth daily.     famotidine (PEPCID) 40 MG tablet 1 tablet Orally Once a day for 30 days     folic acid (FOLVITE) 1 MG tablet Take 1 mg by mouth daily.     methotrexate (RHEUMATREX) 2.5 MG tablet Take 20 mg by mouth once a week. Takes 8 tablets(20mg ) every Saturday.     Caution:Chemotherapy. Protect from light.     metoprolol succinate (TOPROL XL) 25 MG 24  hr tablet Take 1 tablet (25 mg total) by mouth daily. 90 tablet 3   Multiple Vitamins-Minerals (PRESERVISION AREDS 2 PO) Take 2 capsules by mouth daily.     predniSONE (STERAPRED UNI-PAK 21 TAB) 5 MG (21) TBPK tablet Take 5 mg by mouth every morning.     RITUXAN 500 MG/50ML injection See admin instructions. Every 6 months 2 dose infusion Ruxience     rosuvastatin (CRESTOR) 20 MG tablet TAKE 1 TABLET DAILY (DOSE INCREASE) 90 tablet 2   sertraline (ZOLOFT) 20 MG/ML concentrated solution Take 25 mg by mouth daily.     spironolactone (ALDACTONE) 25 MG tablet 12.5 mg. 1/2 tab daily     sulfaSALAzine (AZULFIDINE) 500 MG tablet Take 1,000 mg by mouth 2 (two) times daily.     traMADol (ULTRAM) 50 MG tablet Take 1 tablet (50 mg total) by mouth every 12 (twelve) hours as needed for moderate pain. (Patient taking differently: Take 50 mg by mouth as needed for moderate pain (pain score 4-6).) 60 tablet 2   traZODone (DESYREL) 50 MG tablet TAKE 1.5 TABLETS (75 MG TOTAL) BY MOUTH AT BEDTIME AS NEEDED. FOR SLEEP 225 tablet 2   White Petrolatum-Mineral Oil (GENTEAL TEARS NIGHT-TIME OP) Apply to eye at bedtime.     No current facility-administered medications for this visit.    Allergies  Allergen Reactions   Hydroxychloroquine     Other Reaction(s): cardiolmyopathy    Diagnoses:  Major depression  Plan of Care: Outpatient psychotherapy and medication management as needed. Initial Note: (With wife [name]). She says that she has hoped Trayvin would get into therapy. He has Ataxic Tremor. He told Dr. Arbutus Leas that he would like medication to just sit and do nothing. He says he always had golf to rey on and now cannot play. In December, he went n for a surgery to remove an avm from  brain. Was told it was a routine surgery and was told that he needs to get it out. Was told it would be a short recovery and he would be back to work. 1 1/2 hour surgery turned into 6 hour surgery and he had complications. He had extra  cerebral fluid and Dr. York Spaniel it would absorb and just wait. He kept declining and by March, they went in to try and drain the extra fluid. He was in hospital for a week. Wife noticed that during the hospital stay, his speech declined and he was clearly weak. Nurses were trying to discharge him and she protested, but they sent him home anyway. He declined, and could not sit, stand, talk or walk and she ended up calling ambulance. In ER, she ws told he had a stroke. He was in ER 24 hours before getting a room in Neuro area. Head of stroke team came in and said it was not a stroke, but complications from surgery. She was blamed by medical team for not "speaking up" before being initially discharged. He ended up in a rehab for 2 weeks and told them he needed another surgery. Was told he had hydrocephalus and need a shunt to drain liquid off his brain. She felt a lot of pressure from team that he needed surgery. She wanted a second opinion and had rouble getting it. Spoke to another Dr. and they decided to get the third surgery. He was in OT,PT and Speech Therapy. During therapy, he started having a tremor. They ended up being referred to Dr. Arbutus Leas, who has now told him that treating his tremor is tricky and will take time.  Kirsten is no longer in OT,PT speech therapy. He is supposed to be doing exercises on his own, but has not been emotionally able to ge himself to do it. He has persistent headaches as well. Just had a CAT Scan and awaiting the results. His headaches are worse and they are concerned. He also has rhumatory arthritis and it is flaring up because he had to stop meds. He has cardiomyopathy, macular degeneration on top of all of these other conditions.  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 He has been approved for disability and are working to get social security disability. She works part time. They have 2 daughters, one with diabetes and  arthritis. She is heading to [employer] this Fall and wants to work in Research scientist (physical sciences). Other daughter is 6 and is "our introvert with lots of anxiety". She is in therapy and has mental health issues that is being treated. She will be a Holiday representative at the middle college at [employer].  They are told that medicine will not control his condition, but they will have to try to retrain his brain. Suggested that they work to get back into OT and PT. She has a hard time motivating him. He admits that he keeps things "inside". She says he is not a good comunicator. He will not share things like if he is in pain or hurts himself.   Tx. Plan/Goals: Patient reports depressive symptoms secondary to the medical trauma he has suffered and the debilitating physical results. He is seeking therapy to resolve his feelings of helplessness and hopelessness. Also want assistance in improving his drive/motivation in daily activity. Will engage in therapy that utilizes behavioral approaches to mitigate symptoms along with insight oriented counseling. Will also incorporate some conjoint  sessions with spouse as needed, given her integral involvement with his care. Patient agrees with plan for treatment. Goal Date is 12-24    Patient agrees to be seen for a video session and understands the limitations of the platform. He is at home and provider is in home office.  Session Note:  Patient states he had 2 crowns this morning and isn't feeling great. He is feeling good that he joined his friends for golf over the weekend. Had a good time and they are doing it again next weekend. He is playing with the guys he always golfed with, except for one who passed away. He feels this gives him something to look forward to every week and he will continue to practice. He reports being more optimistic than before, but still has the strong feeling that his life will never be what it once was and therefore not as satisfying. We talked about considering that his  new normal can be a gratifying life, which is a difficult concept to accept.  life                                   Garrel Ridgel, PhD 4:10p-5:00 50 minutes

## 2023-03-30 ENCOUNTER — Other Ambulatory Visit: Payer: Self-pay | Admitting: Interventional Cardiology

## 2023-03-31 ENCOUNTER — Ambulatory Visit (INDEPENDENT_AMBULATORY_CARE_PROVIDER_SITE_OTHER): Payer: BC Managed Care – PPO | Admitting: Psychology

## 2023-03-31 DIAGNOSIS — F329 Major depressive disorder, single episode, unspecified: Secondary | ICD-10-CM

## 2023-03-31 DIAGNOSIS — F321 Major depressive disorder, single episode, moderate: Secondary | ICD-10-CM

## 2023-03-31 NOTE — Progress Notes (Signed)
[facility] Counselor Initial Adult Exam  Name: Christopher Yu Date: 03/31/2023 MRN: [id number] DOB: 1960/12/16 PCP: Thana Ates, MD    Guardian/Payee:  N/A    Paperwork requested: No   Reason for Visit Christopher Yu Problem: Adjustment to serious medical illness  Mental Status Exam: Appearance:   Casual     Behavior:  Appropriate  Motor:  Shuffling Gait  Speech/Language:   Slurred  Affect:  Depressed  Mood:  depressed  Thought process:  normal  Thought content:    WNL  Sensory/Perceptual disturbances:    WNL  Orientation:  oriented to person, place, and situation  Attention:  Good  Concentration:  Good  Memory:  WNL  Fund of knowledge:   Good  Insight:    Good  Judgment:   unknown  Impulse Control:  unknown    Reported Symptoms:  Sadness, helplessness and hopelessness  Risk Assessment: Danger to Self:  No Self-injurious Behavior: No Danger to Others: No Duty to Warn:no Physical Aggression / Violence:No  Access to Firearms a concern:  Unknown Gang Involvement:No  Patient / guardian was educated about steps to take if suicide or homicide risk level increases between visits: n/a While future psychiatric events cannot be accurately predicted, the patient does not currently require acute inpatient psychiatric care and does not currently meet [facility] involuntary commitment criteria.  Substance Abuse History: Current substance abuse: No     Past Psychiatric History:   No previous psychological problems have been observed Outpatient  Providers:N/A History of Psych Hospitalization: No  Psychological Testing:  none    Abuse History:  Victim of:   N/A   Report needed: No. Victim of Neglect:No. Perpetrator of  N/A   Witness / Exposure to Domestic Violence: No   Protective Services Involvement: No  Witness to [employer] Violence:  No   Family History:  Family History  Problem Relation Age of Onset   Other Father        MVA   Lupus Sister     Living situation: the patient lives with their family  Sexual Orientation: Straight  Relationship Status: married  Name of spouse Christopher Yu If a parent, number of children / ages:Two girls, ages 8 and 70  Support Systems: spouse  Surveyor, quantity Stress:  Yes   Income/Employment/Disability: Software engineer:  unknown  Educational History:  Education:  unknown  Religion/Sprituality/World View: unknown  Any cultural differences that may affect / interfere with treatment:  not applicable   Recreation/Hobbies: golfing  Stressors: Financial difficulties   Health problems    Strengths: Family  Barriers:  severe physical limitations that compromises activity   Legal History: Pending legal issue / charges: The patient has no significant history of legal issues. History of legal issue / charges:  N/A  Medical History/Surgical History: reviewed Past Medical History:  Diagnosis Date   Anxiety    Arthritis, rheumatoid (HCC)    dx 1988   CAD (coronary artery disease), native coronary artery    Mild LAD disease with calcification noted at cath 12//27/16    Cardiomyopathy (HCC)    Cardiomyopathy (HCC)    Chronic systolic heart failure (HCC) 05/01/2015   Depression    Deviated nasal septum 06/25/2011   Headache    High cholesterol    Hyperlipidemia    Impingement syndrome of left shoulder 12/22/2017   Macular degeneration    Nausea and vomiting 08/28/2022   Rheumatoid aortitis    Sleep apnea    does not use cpap - UNABLE TO TOLERATE MASK    Sleep apnea in adult    deviated septum repaired, most recent sleep study was negative   Status post total right knee replacement 06/01/2015    Past Surgical History:  Procedure Laterality Date   ANKLE FUSION Right 05/05/2012   related to his arthritis   ANKLE FUSION Left 05/25/2013   related to his arthritis   ANKLE SURGERY Bilateral    APPLICATION OF CRANIAL NAVIGATION N/A 04/11/2022   Procedure: APPLICATION OF CRANIAL NAVIGATION;  Surgeon: Lisbeth Renshaw, MD;  Location: [facility] OR;  Service: Neurosurgery;  Laterality: N/A;   APPLICATION OF CRANIAL NAVIGATION N/A 08/08/2022   Procedure: APPLICATION OF CRANIAL NAVIGATION;  Surgeon: Lisbeth Renshaw, MD;  Location: [facility] OR;  Service: Neurosurgery;  Laterality: N/A;   CARDIAC CATHETERIZATION N/A 05/01/2015   Procedure: Left Heart Cath and Coronary Angiography;  Surgeon: Lyn Records, MD;  Location: [facility] INVASIVE CV LAB;  Service: Cardiovascular;  Laterality: N/A;   CRANIOTOMY N/A 04/11/2022   Procedure: STEREOTACTIC SUBOCCIPITAL CRANIOTOMY FOR RESECTION OF ARTERIO-VENOUS MALFORMATION;  Surgeon: Lisbeth Renshaw, MD;  Location: [facility] OR;  Service: Neurosurgery;  Laterality: N/A;   FRACTURE SURGERY     left femur fracture x 3   IR ANGIO EXTERNAL CAROTID SEL EXT CAROTID BILAT MOD SED  01/28/2022   IR ANGIO INTRA EXTRACRAN SEL INTERNAL CAROTID BILAT MOD SED  01/28/2022   IR ANGIO INTRA EXTRACRAN SEL INTERNAL CAROTID BILAT MOD SED  04/16/2022   IR ANGIO VERTEBRAL SEL VERTEBRAL BILAT MOD SED  01/28/2022   IR ANGIO VERTEBRAL SEL VERTEBRAL BILAT MOD SED  04/16/2022   KNEE ARTHROSCOPY     right x 4   LAPAROSCOPIC REVISION VENTRICULAR-PERITONEAL (V-P) SHUNT N/A 08/08/2022   Procedure: LAPAROSCOPIC VENTRICULAR-PERITONEAL (V-P) SHUNT;  Surgeon: Harriette Bouillon, MD;  Location: [facility] OR;  Service: General;  Laterality: N/A;   LUMBAR LAMINECTOMY/DECOMPRESSION MICRODISCECTOMY N/A 07/18/2022   Procedure: Repair of Pseudomeningiocele posterior;  Surgeon:  Lisbeth Renshaw, MD;  Location: [facility] OR;  Service: Neurosurgery;  Laterality: N/A;   NASAL SEPTOPLASTY W/ TURBINOPLASTY  06/25/2011   Procedure: NASAL SEPTOPLASTY WITH TURBINATE REDUCTION;  Surgeon: Osborn Coho, MD;  Location: [facility] OR;  Service: ENT;  Laterality: Bilateral;   PLACEMENT OF LUMBAR DRAIN N/A 07/18/2022   Procedure: PLACEMENT OF LUMBAR DRAIN;  Surgeon: Lisbeth Renshaw, MD;  Location: [facility] OR;  Service: Neurosurgery;  Laterality: N/A;   TONSILLECTOMY     TOTAL KNEE ARTHROPLASTY Right 06/01/2015   Procedure: RIGHT TOTAL KNEE ARTHROPLASTY;  Surgeon: Kathryne Hitch, MD;  Location: [facility] ORS;  Service: Orthopedics;  Laterality: Right;  Block+general   VENTRICULOPERITONEAL SHUNT Right 08/08/2022   Procedure: LAP ASSITED SHUNT INSERTION VENTRICULAR-PERITONEAL;  Surgeon: Lisbeth Renshaw, MD;  Location: [facility] OR;  Service: Neurosurgery;  Laterality: Right;    Medications: Current Outpatient Medications  Medication Sig Dispense Refill   acetaminophen (TYLENOL) 325 MG tablet Take 2 tablets (650 mg total) by mouth 3 (three) times daily.     alendronate (FOSAMAX) 10 MG tablet Take 10 mg by mouth daily before breakfast. Take with a full glass of water on an empty stomach.     artificial tears (LACRILUBE) OINT ophthalmic ointment Place into both eyes 2 (two) times daily.     aspirin EC 81 MG tablet Take 1 tablet (81 mg total) by mouth daily. Swallow whole. 30 tablet 12   calcium carbonate (TUMS - DOSED IN MG ELEMENTAL CALCIUM) 500 MG chewable tablet Chew 1 tablet (200 mg of elemental calcium total) by mouth daily with supper.     Cholecalciferol (VITAMIN D3) 50 MCG (2000 UT) TABS Take 2,000 Units by mouth daily.     famotidine (PEPCID) 40 MG tablet 1 tablet Orally Once a day for 30 days     folic acid (FOLVITE) 1 MG tablet Take 1 mg by mouth daily.     methotrexate (RHEUMATREX) 2.5 MG tablet Take 20 mg by mouth once a week. Takes 8 tablets(20mg ) every Saturday.     Caution:Chemotherapy.  Protect from light.     metoprolol succinate (TOPROL-XL) 25 MG 24 hr tablet TAKE 1 TABLET DAILY 90 tablet 3   Multiple Vitamins-Minerals (PRESERVISION AREDS 2 PO) Take 2 capsules by mouth daily.     predniSONE (STERAPRED UNI-PAK 21 TAB) 5 MG (21) TBPK tablet Take 5 mg by mouth every morning.     RITUXAN 500 MG/50ML injection See admin instructions. Every 6 months 2 dose infusion Ruxience     rosuvastatin (CRESTOR) 20 MG tablet TAKE 1 TABLET DAILY (DOSE INCREASE) 90 tablet 2   sertraline (ZOLOFT) 20 MG/ML concentrated solution Take 25 mg by mouth daily.     spironolactone (ALDACTONE) 25 MG tablet 12.5 mg. 1/2 tab daily     sulfaSALAzine (AZULFIDINE) 500 MG tablet Take 1,000 mg by mouth 2 (two) times daily.     traMADol (ULTRAM) 50 MG tablet Take 1 tablet (50 mg total) by mouth every 12 (twelve) hours as needed for moderate pain. (Patient taking differently: Take 50 mg by mouth as needed for moderate pain (pain score 4-6).) 60 tablet 2   traZODone (DESYREL) 50 MG tablet TAKE 1.5 TABLETS (75 MG TOTAL) BY MOUTH AT BEDTIME AS NEEDED. FOR SLEEP 225 tablet 2   White Petrolatum-Mineral Oil (GENTEAL TEARS NIGHT-TIME OP) Apply to eye at bedtime.     No current facility-administered medications for this visit.    Allergies  Allergen Reactions   Hydroxychloroquine     Other Reaction(s): cardiolmyopathy    Diagnoses:  Major depression  Plan of Care: Outpatient psychotherapy and medication management as needed. Initial Note: (With wife Christopher Yu). She says that she has hoped Christopher Yu would get into therapy. He has Ataxic Tremor. He told Dr. Arbutus Leas that he would like medication to just sit and do nothing. He says he always had golf to rey on and now cannot play. In December, he went  n for a surgery to remove an avm from brain. Was told it was a routine surgery and was told that he needs to get it out. Was told it would be a short recovery and he would be back to work. 1 1/2 hour surgery turned into 6 hour surgery  and he had complications. He had extra cerebral fluid and Dr. York Spaniel it would absorb and just wait. He kept declining and by March, they went in to try and drain the extra fluid. He was in hospital for a week. Wife noticed that during the hospital stay, his speech declined and he was clearly weak. Nurses were trying to discharge him and she protested, but they sent him home anyway. He declined, and could not sit, stand, talk or walk and she ended up calling ambulance. In ER, she ws told he had a stroke. He was in ER 24 hours before getting a room in Neuro area. Head of stroke team came in and said it was not a stroke, but complications from surgery. She was blamed by medical team for not "speaking up" before being initially discharged. He ended up in a rehab for 2 weeks and told them he needed another surgery. Was told he had hydrocephalus and need a shunt to drain liquid off his brain. She felt a lot of pressure from team that he needed surgery. She wanted a second opinion and had rouble getting it. Spoke to another Dr. and they decided to get the third surgery. He was in OT,PT and Speech Therapy. During therapy, he started having a tremor. They ended up being referred to Dr. Arbutus Leas, who has now told him that treating his tremor is tricky and will take time.  Christopher Yu is no longer in OT,PT speech therapy. He is supposed to be doing exercises on his own, but has not been emotionally able to ge himself to do it. He has persistent headaches as well. Just had a CAT Scan and awaiting the results. His headaches are worse and they are concerned. He also has rhumatory arthritis and it is flaring up because he had to stop meds. He has cardiomyopathy, macular degeneration on top of all of these other conditions.  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 He has been approved for disability and are working to get social security disability. She works part time. They  have 2 daughters, one with diabetes and arthritis. She is heading to [employer] this Fall and wants to work in Research scientist (physical sciences). Other daughter is 63 and is "our introvert with lots of anxiety". She is in therapy and has mental health issues that is being treated. She will be a Holiday representative at the middle college at [employer].  They are told that medicine will not control his condition, but they will have to try to retrain his brain. Suggested that they work to get back into OT and PT. She has a hard time motivating him. He admits that he keeps things "inside". She says he is not a good comunicator. He will not share things like if he is in pain or hurts himself.   Tx. Plan/Goals: Patient reports depressive symptoms secondary to the medical trauma he has suffered and the debilitating physical results. He is seeking therapy to resolve his feelings of helplessness and hopelessness. Also want assistance in improving his drive/motivation in daily activity. Will engage in therapy that utilizes behavioral approaches to mitigate symptoms along  with insight oriented counseling. Will also incorporate some conjoint sessions with spouse as needed, given her integral involvement with his care. Patient agrees with plan for treatment. Goal Date is 12-24    Patient agrees to be seen for a video session and understands the limitations of the platform. He is at home and provider is in home office.  Session Note:  Patient states he is doing well. He went out again last weekend with friends, but says it was a challenge. He sat in the cart most of the time due to stiffness. He is expecting both daughters home for Thanksgiving and it will just be the 4 of them. Larey talked about how the relationships with daughters is different now that his speech is compromised. He takes verbal "short cuts" and it doesn't reflect where his head is. He has to think about what he wants to say before trying to communicate, which slows the interactions and  hinders spontaneity.                                        Garrel Ridgel, PhD 12:30p-1:15 45 minutes

## 2023-04-01 ENCOUNTER — Ambulatory Visit: Payer: BC Managed Care – PPO | Admitting: Psychology

## 2023-04-08 ENCOUNTER — Other Ambulatory Visit: Payer: Self-pay | Admitting: Physical Medicine and Rehabilitation

## 2023-04-08 ENCOUNTER — Ambulatory Visit: Payer: BC Managed Care – PPO | Admitting: Psychology

## 2023-04-08 DIAGNOSIS — F329 Major depressive disorder, single episode, unspecified: Secondary | ICD-10-CM

## 2023-04-08 DIAGNOSIS — F321 Major depressive disorder, single episode, moderate: Secondary | ICD-10-CM

## 2023-04-08 NOTE — Progress Notes (Signed)
[facility] Counselor Initial Adult Exam  Name: Christopher Yu Date: 04/08/2023 MRN: [id number] DOB: October 14, 1960 PCP: Thana Ates, MD    Guardian/Payee:  N/A    Paperwork requested: No   Reason for Visit [name] Problem: Adjustment to serious medical illness  Mental Status Exam: Appearance:   Casual     Behavior:  Appropriate  Motor:  Shuffling Gait  Speech/Language:   Slurred  Affect:  Depressed  Mood:  depressed  Thought process:  normal  Thought content:    WNL  Sensory/Perceptual disturbances:    WNL  Orientation:  oriented to person, place, and situation  Attention:  Good  Concentration:  Good  Memory:  WNL  Fund of knowledge:   Good  Insight:    Good  Judgment:   unknown  Impulse Control:  unknown    Reported Symptoms:  Sadness, helplessness and hopelessness  Risk Assessment: Danger to Self:  No Self-injurious Behavior: No Danger to Others: No Duty to Warn:no Physical Aggression / Violence:No  Access to Firearms a concern:  Unknown Gang Involvement:No  Patient / guardian was educated about steps to take if suicide or homicide risk level increases between visits: n/a While future psychiatric events cannot be accurately predicted, the patient does not currently require acute inpatient psychiatric care and does not currently meet [facility] involuntary commitment criteria.  Substance Abuse History: Current substance abuse: No     Past Psychiatric History:   No previous psychological problems have been  observed Outpatient Providers:N/A History of Psych Hospitalization: No  Psychological Testing:  none    Abuse History:  Victim of:   N/A   Report needed: No. Victim of Neglect:No. Perpetrator of  N/A   Witness / Exposure to Domestic Violence: No   Protective Services Involvement: No  Witness to [employer] Violence:  No   Family History:  Family History  Problem Relation Age of Onset   Other Father        MVA   Lupus Sister     Living situation: the patient lives with their family  Sexual Orientation: Straight  Relationship Status: married  Name of spouse Christopher Yu If a parent, number of children / ages:Two girls, ages 72 and 28  Support Systems: spouse  Surveyor, quantity Stress:  Yes   Income/Employment/Disability: Long-Term Dispensing optician:  unknown  Educational History: Education:  unknown  Religion/Sprituality/World View: unknown  Any cultural differences that may affect / interfere with treatment:  not applicable   Recreation/Hobbies: golfing  Stressors: Financial difficulties   Health problems    Strengths: Family  Barriers:  severe physical limitations that compromises activity   Legal History: Pending legal issue / charges: The patient has no significant history of legal issues. History of legal issue / charges:  N/A  Medical History/Surgical History: reviewed Past Medical History:  Diagnosis Date   Anxiety    Arthritis, rheumatoid (HCC)    dx 1988   CAD (coronary artery disease), native coronary artery    Mild LAD disease with calcification noted at cath 12//27/16    Cardiomyopathy (HCC)    Cardiomyopathy (HCC)    Chronic systolic heart failure (HCC) 05/01/2015   Depression    Deviated nasal septum 06/25/2011   Headache    High cholesterol    Hyperlipidemia    Impingement syndrome of left shoulder 12/22/2017   Macular degeneration    Nausea and vomiting 08/28/2022   Rheumatoid aortitis    Sleep apnea    does not use cpap - UNABLE TO  TOLERATE MASK   Sleep apnea in adult    deviated septum repaired, most recent sleep study was negative   Status post total right knee replacement 06/01/2015    Past Surgical History:  Procedure Laterality Date   ANKLE FUSION Right 05/05/2012   related to his arthritis   ANKLE FUSION Left 05/25/2013   related to his arthritis   ANKLE SURGERY Bilateral    APPLICATION OF CRANIAL NAVIGATION N/A 04/11/2022   Procedure: APPLICATION OF CRANIAL NAVIGATION;  Surgeon: Lisbeth Renshaw, MD;  Location: [facility] OR;  Service: Neurosurgery;  Laterality: N/A;   APPLICATION OF CRANIAL NAVIGATION N/A 08/08/2022   Procedure: APPLICATION OF CRANIAL NAVIGATION;  Surgeon: Lisbeth Renshaw, MD;  Location: [facility] OR;  Service: Neurosurgery;  Laterality: N/A;   CARDIAC CATHETERIZATION N/A 05/01/2015   Procedure: Left Heart Cath and Coronary Angiography;  Surgeon: Lyn Records, MD;  Location: [facility] INVASIVE CV LAB;  Service: Cardiovascular;  Laterality: N/A;   CRANIOTOMY N/A 04/11/2022   Procedure: STEREOTACTIC SUBOCCIPITAL CRANIOTOMY FOR RESECTION OF ARTERIO-VENOUS MALFORMATION;  Surgeon: Lisbeth Renshaw, MD;  Location: [facility] OR;  Service: Neurosurgery;  Laterality: N/A;   FRACTURE SURGERY     left femur fracture x 3   IR ANGIO EXTERNAL CAROTID SEL EXT CAROTID BILAT MOD SED  01/28/2022   IR ANGIO INTRA EXTRACRAN SEL INTERNAL CAROTID BILAT MOD SED  01/28/2022   IR ANGIO INTRA EXTRACRAN SEL INTERNAL CAROTID BILAT MOD SED  04/16/2022   IR ANGIO VERTEBRAL SEL VERTEBRAL BILAT MOD SED  01/28/2022   IR ANGIO VERTEBRAL SEL VERTEBRAL BILAT MOD SED  04/16/2022   KNEE ARTHROSCOPY     right x 4   LAPAROSCOPIC REVISION VENTRICULAR-PERITONEAL (V-P) SHUNT N/A 08/08/2022   Procedure: LAPAROSCOPIC VENTRICULAR-PERITONEAL (V-P) SHUNT;  Surgeon: Harriette Bouillon, MD;  Location: [facility] OR;  Service: General;  Laterality: N/A;   LUMBAR LAMINECTOMY/DECOMPRESSION MICRODISCECTOMY N/A 07/18/2022   Procedure: Repair of Pseudomeningiocele posterior;   Surgeon: Lisbeth Renshaw, MD;  Location: [facility] OR;  Service: Neurosurgery;  Laterality: N/A;   NASAL SEPTOPLASTY W/ TURBINOPLASTY  06/25/2011   Procedure: NASAL SEPTOPLASTY WITH TURBINATE REDUCTION;  Surgeon: Osborn Coho, MD;  Location: [facility] OR;  Service: ENT;  Laterality: Bilateral;   PLACEMENT OF LUMBAR DRAIN N/A 07/18/2022   Procedure:  PLACEMENT OF LUMBAR DRAIN;  Surgeon: Lisbeth Renshaw, MD;  Location: [facility] OR;  Service: Neurosurgery;  Laterality: N/A;   TONSILLECTOMY     TOTAL KNEE ARTHROPLASTY Right 06/01/2015   Procedure: RIGHT TOTAL KNEE ARTHROPLASTY;  Surgeon: Kathryne Hitch, MD;  Location: [facility] ORS;  Service: Orthopedics;  Laterality: Right;  Block+general   VENTRICULOPERITONEAL SHUNT Right 08/08/2022   Procedure: LAP ASSITED SHUNT INSERTION VENTRICULAR-PERITONEAL;  Surgeon: Lisbeth Renshaw, MD;  Location: [facility] OR;  Service: Neurosurgery;  Laterality: Right;    Medications: Current Outpatient Medications  Medication Sig Dispense Refill   acetaminophen (TYLENOL) 325 MG tablet Take 2 tablets (650 mg total) by mouth 3 (three) times daily.     alendronate (FOSAMAX) 10 MG tablet Take 10 mg by mouth daily before breakfast. Take with a full glass of water on an empty stomach.     artificial tears (LACRILUBE) OINT ophthalmic ointment Place into both eyes 2 (two) times daily.     aspirin EC 81 MG tablet Take 1 tablet (81 mg total) by mouth daily. Swallow whole. 30 tablet 12   calcium carbonate (TUMS - DOSED IN MG ELEMENTAL CALCIUM) 500 MG chewable tablet Chew 1 tablet (200 mg of elemental calcium total) by mouth daily with supper.     Cholecalciferol (VITAMIN D3) 50 MCG (2000 UT) TABS Take 2,000 Units by mouth daily.     famotidine (PEPCID) 40 MG tablet 1 tablet Orally Once a day for 30 days     folic acid (FOLVITE) 1 MG tablet Take 1 mg by mouth daily.     methotrexate (RHEUMATREX) 2.5 MG tablet Take 20 mg by mouth once a week. Takes 8 tablets(20mg ) every Saturday.      Caution:Chemotherapy. Protect from light.     metoprolol succinate (TOPROL-XL) 25 MG 24 hr tablet TAKE 1 TABLET DAILY 90 tablet 3   Multiple Vitamins-Minerals (PRESERVISION AREDS 2 PO) Take 2 capsules by mouth daily.     predniSONE (STERAPRED UNI-PAK 21 TAB) 5 MG (21) TBPK tablet Take 5 mg by mouth every morning.     RITUXAN 500 MG/50ML injection See admin instructions. Every 6 months 2 dose infusion Ruxience     rosuvastatin (CRESTOR) 20 MG tablet TAKE 1 TABLET DAILY (DOSE INCREASE) 90 tablet 2   sertraline (ZOLOFT) 20 MG/ML concentrated solution Take 25 mg by mouth daily.     spironolactone (ALDACTONE) 25 MG tablet 12.5 mg. 1/2 tab daily     sulfaSALAzine (AZULFIDINE) 500 MG tablet Take 1,000 mg by mouth 2 (two) times daily.     traMADol (ULTRAM) 50 MG tablet Take 1 tablet (50 mg total) by mouth every 12 (twelve) hours as needed for moderate pain. (Patient taking differently: Take 50 mg by mouth as needed for moderate pain (pain score 4-6).) 60 tablet 2   traZODone (DESYREL) 50 MG tablet TAKE 1.5 TABLETS (75 MG TOTAL) BY MOUTH AT BEDTIME AS NEEDED. FOR SLEEP 225 tablet 2   White Petrolatum-Mineral Oil (GENTEAL TEARS NIGHT-TIME OP) Apply to eye at bedtime.     No current facility-administered medications for this visit.    Allergies  Allergen Reactions   Hydroxychloroquine     Other Reaction(s): cardiolmyopathy    Diagnoses:  Major depression  Plan of Care: Outpatient psychotherapy and medication management as needed. Initial Note: (With wife Christopher Yu). She says that she has hoped Sushanth would get into therapy. He has Ataxic Tremor. He told Dr. Arbutus Leas that he would like medication to just sit and do nothing. He says he  always had golf to rey on and now cannot play. In December, he went n for a surgery to remove an avm from brain. Was told it was a routine surgery and was told that he needs to get it out. Was told it would be a short recovery and he would be back to work. 1 1/2 hour surgery  turned into 6 hour surgery and he had complications. He had extra cerebral fluid and Dr. York Spaniel it would absorb and just wait. He kept declining and by March, they went in to try and drain the extra fluid. He was in hospital for a week. Wife noticed that during the hospital stay, his speech declined and he was clearly weak. Nurses were trying to discharge him and she protested, but they sent him home anyway. He declined, and could not sit, stand, talk or walk and she ended up calling ambulance. In ER, she ws told he had a stroke. He was in ER 24 hours before getting a room in Neuro area. Head of stroke team came in and said it was not a stroke, but complications from surgery. She was blamed by medical team for not "speaking up" before being initially discharged. He ended up in a rehab for 2 weeks and told them he needed another surgery. Was told he had hydrocephalus and need a shunt to drain liquid off his brain. She felt a lot of pressure from team that he needed surgery. She wanted a second opinion and had rouble getting it. Spoke to another Dr. and they decided to get the third surgery. He was in OT,PT and Speech Therapy. During therapy, he started having a tremor. They ended up being referred to Dr. Arbutus Leas, who has now told him that treating his tremor is tricky and will take time.  Shawnell is no longer in OT,PT speech therapy. He is supposed to be doing exercises on his own, but has not been emotionally able to ge himself to do it. He has persistent headaches as well. Just had a CAT Scan and awaiting the results. His headaches are worse and they are concerned. He also has rhumatory arthritis and it is flaring up because he had to stop meds. He has cardiomyopathy, macular degeneration on top of all of these other conditions.  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 He has been approved for disability and are working to get social security disability.  She works part time. They have 2 daughters, one with diabetes and arthritis. She is heading to [employer] this Fall and wants to work in Research scientist (physical sciences). Other daughter is 67 and is "our introvert with lots of anxiety". She is in therapy and has mental health issues that is being treated. She will be a Holiday representative at the middle college at [employer].  They are told that medicine will not control his condition, but they will have to try to retrain his brain. Suggested that they work to get back into OT and PT. She has a hard time motivating him. He admits that he keeps things "inside". She says he is not a good comunicator. He will not share things like if he is in pain or hurts himself.   Tx. Plan/Goals: Patient reports depressive symptoms secondary to the medical trauma he has suffered and the debilitating physical results. He is seeking therapy to resolve his feelings of helplessness and hopelessness. Also want assistance in improving his drive/motivation in  daily activity. Will engage in therapy that utilizes behavioral approaches to mitigate symptoms along with insight oriented counseling. Will also incorporate some conjoint sessions with spouse as needed, given her integral involvement with his care. Patient agrees with plan for treatment. Goal Date is 12-25    Patient agrees to be seen for a video session and understands the limitations of the platform. He is at home and provider is in home office.  Session Note:  Patient states that family will be home for Christmas. His Thanksgiving was good and a good week. He talked about his lack of faith in the [employer], but his wife and 1 daughter are observant. This does not cause any stress in family. Yousaf talked about his Native American culture and the discrimination that exists against his community. He takes great pride and interest in his heritage. Moods are stable and he is optimistic about upcoming holidays.                                           Garrel Ridgel, PhD 12:30p-1:15 45 minutes

## 2023-04-09 ENCOUNTER — Encounter: Payer: Self-pay | Admitting: Physical Medicine and Rehabilitation

## 2023-04-09 NOTE — Telephone Encounter (Signed)
HST- [employer] no auth req via fax form.

## 2023-04-13 ENCOUNTER — Other Ambulatory Visit: Payer: Self-pay | Admitting: Interventional Cardiology

## 2023-04-15 ENCOUNTER — Ambulatory Visit: Payer: BC Managed Care – PPO | Admitting: Neurology

## 2023-04-15 ENCOUNTER — Ambulatory Visit: Payer: BC Managed Care – PPO | Admitting: Psychology

## 2023-04-15 DIAGNOSIS — I639 Cerebral infarction, unspecified: Secondary | ICD-10-CM

## 2023-04-15 DIAGNOSIS — Q282 Arteriovenous malformation of cerebral vessels: Secondary | ICD-10-CM

## 2023-04-15 DIAGNOSIS — G4733 Obstructive sleep apnea (adult) (pediatric): Secondary | ICD-10-CM | POA: Diagnosis not present

## 2023-04-15 DIAGNOSIS — I5022 Chronic systolic (congestive) heart failure: Secondary | ICD-10-CM

## 2023-04-15 DIAGNOSIS — G9782 Other postprocedural complications and disorders of nervous system: Secondary | ICD-10-CM

## 2023-04-15 DIAGNOSIS — Z9189 Other specified personal risk factors, not elsewhere classified: Secondary | ICD-10-CM

## 2023-04-15 DIAGNOSIS — G4737 Central sleep apnea in conditions classified elsewhere: Secondary | ICD-10-CM

## 2023-04-19 ENCOUNTER — Encounter: Payer: Self-pay | Admitting: Physical Medicine and Rehabilitation

## 2023-04-22 ENCOUNTER — Ambulatory Visit: Payer: BC Managed Care – PPO | Admitting: Psychology

## 2023-04-22 DIAGNOSIS — F329 Major depressive disorder, single episode, unspecified: Secondary | ICD-10-CM | POA: Diagnosis not present

## 2023-04-22 DIAGNOSIS — F321 Major depressive disorder, single episode, moderate: Secondary | ICD-10-CM

## 2023-04-22 NOTE — Progress Notes (Signed)
[facility] Counselor Initial Adult Exam  Name: Christopher Yu Date: 04/22/2023 MRN: [id number] DOB: 1960/10/26 PCP: Thana Ates, MD    Guardian/Payee:  N/A    Paperwork requested: No   Reason for Visit Christopher Yu Problem: Adjustment to serious medical illness  Mental Status Exam: Appearance:   Casual     Behavior:  Appropriate  Motor:  Shuffling Gait  Speech/Language:   Slurred  Affect:  Depressed  Mood:  depressed  Thought process:  normal  Thought content:    WNL  Sensory/Perceptual disturbances:    WNL  Orientation:  oriented to person, place, and situation  Attention:  Good  Concentration:  Good  Memory:  WNL  Fund of knowledge:   Good  Insight:    Good  Judgment:   unknown  Impulse Control:  unknown    Reported Symptoms:  Sadness, helplessness and hopelessness  Risk Assessment: Danger to Self:  No Self-injurious Behavior: No Danger to Others: No Duty to Warn:no Physical Aggression / Violence:No  Access to Firearms a concern:  Unknown Gang Involvement:No  Patient / guardian was educated about steps to take if suicide or homicide risk level increases between visits: n/a While future psychiatric events cannot be accurately predicted, the patient does not currently require acute inpatient psychiatric care and does not currently meet [facility] involuntary commitment criteria.  Substance Abuse History: Current substance abuse: No     Past Psychiatric History:   No previous psychological  problems have been observed Outpatient Providers:N/A History of Psych Hospitalization: No  Psychological Testing:  none    Abuse History:  Victim of:   N/A   Report needed: No. Victim of Neglect:No. Perpetrator of  N/A   Witness / Exposure to Domestic Violence: No   Protective Services Involvement: No  Witness to [employer] Violence:  No   Family History:  Family History  Problem Relation Age of Onset   Other Father        MVA   Lupus Sister     Living situation: the patient lives with their family  Sexual Orientation: Straight  Relationship Status: married  Name of spouse Christopher Yu If a parent, number of  children / ages:Two girls, ages 80 and 25  Support Systems: spouse  Surveyor, quantity Stress:  Yes   Income/Employment/Disability: Software engineer:  unknown  Educational History: Education:  unknown  Oncologist: unknown  Any cultural differences that may affect / interfere with treatment:  not applicable   Recreation/Hobbies: golfing  Stressors: Financial difficulties   Health problems    Strengths: Family  Barriers:  severe physical limitations that compromises activity   Legal History: Pending legal issue / charges: The patient has no significant history of legal issues. History of legal issue / charges:  N/A  Medical History/Surgical History: reviewed Past Medical History:  Diagnosis Date   Anxiety    Arthritis, rheumatoid (HCC)    dx 1988   CAD (coronary artery disease), native coronary artery    Mild LAD disease with calcification noted at cath 12//27/16    Cardiomyopathy (HCC)    Cardiomyopathy (HCC)    Chronic systolic heart failure (HCC) 05/01/2015   Depression    Deviated nasal septum 06/25/2011   Headache    High cholesterol    Hyperlipidemia    Impingement syndrome of left shoulder 12/22/2017   Macular degeneration    Nausea and vomiting 08/28/2022   Rheumatoid aortitis    Sleep apnea    does not  use cpap - UNABLE TO TOLERATE MASK   Sleep apnea in adult    deviated septum repaired, most recent sleep study was negative   Status post total right knee replacement 06/01/2015    Past Surgical History:  Procedure Laterality Date   ANKLE FUSION Right 05/05/2012   related to his arthritis   ANKLE FUSION Left 05/25/2013   related to his arthritis   ANKLE SURGERY Bilateral    APPLICATION OF CRANIAL NAVIGATION N/A 04/11/2022   Procedure: APPLICATION OF CRANIAL NAVIGATION;  Surgeon: Lisbeth Renshaw, MD;  Location: [facility] OR;  Service: Neurosurgery;  Laterality: N/A;   APPLICATION OF CRANIAL NAVIGATION N/A 08/08/2022   Procedure: APPLICATION OF CRANIAL NAVIGATION;  Surgeon: Lisbeth Renshaw, MD;  Location: [facility] OR;  Service: Neurosurgery;  Laterality: N/A;   CARDIAC CATHETERIZATION N/A 05/01/2015   Procedure: Left Heart Cath and Coronary Angiography;  Surgeon: Lyn Records, MD;  Location: [facility] INVASIVE CV LAB;  Service: Cardiovascular;  Laterality: N/A;   CRANIOTOMY N/A 04/11/2022   Procedure: STEREOTACTIC SUBOCCIPITAL CRANIOTOMY FOR RESECTION OF ARTERIO-VENOUS MALFORMATION;  Surgeon: Lisbeth Renshaw, MD;  Location: [facility] OR;  Service: Neurosurgery;  Laterality: N/A;   FRACTURE SURGERY     left femur fracture x 3   IR ANGIO EXTERNAL CAROTID SEL EXT CAROTID BILAT MOD SED  01/28/2022   IR ANGIO INTRA EXTRACRAN SEL INTERNAL CAROTID BILAT MOD SED  01/28/2022   IR ANGIO INTRA EXTRACRAN SEL INTERNAL CAROTID BILAT MOD SED  04/16/2022   IR ANGIO VERTEBRAL SEL VERTEBRAL BILAT MOD SED  01/28/2022   IR ANGIO VERTEBRAL SEL VERTEBRAL BILAT MOD SED  04/16/2022   KNEE ARTHROSCOPY     right x 4   LAPAROSCOPIC REVISION VENTRICULAR-PERITONEAL (V-P) SHUNT N/A 08/08/2022   Procedure: LAPAROSCOPIC VENTRICULAR-PERITONEAL (V-P) SHUNT;  Surgeon: Harriette Bouillon, MD;  Location: [facility];  Service: General;  Laterality: N/A;   LUMBAR LAMINECTOMY/DECOMPRESSION MICRODISCECTOMY N/A 07/18/2022   Procedure: Repair of  Pseudomeningiocele posterior;  Surgeon: Lisbeth Renshaw, MD;  Location: [facility];  Service: Neurosurgery;  Laterality: N/A;   NASAL SEPTOPLASTY W/ TURBINOPLASTY  06/25/2011   Procedure: NASAL SEPTOPLASTY WITH TURBINATE REDUCTION;  Surgeon: Osborn Coho, MD;  Location: [facility];  Service: ENT;  Laterality: Bilateral;   PLACEMENT OF LUMBAR DRAIN N/A 07/18/2022   Procedure: PLACEMENT OF LUMBAR DRAIN;  Surgeon: Lisbeth Renshaw, MD;  Location: [facility];  Service: Neurosurgery;  Laterality: N/A;   TONSILLECTOMY     TOTAL KNEE ARTHROPLASTY Right 06/01/2015   Procedure: RIGHT TOTAL KNEE ARTHROPLASTY;  Surgeon: Kathryne Hitch, MD;  Location: [facility] ORS;  Service: Orthopedics;  Laterality: Right;  Block+general   VENTRICULOPERITONEAL SHUNT Right 08/08/2022   Procedure: LAP ASSITED SHUNT INSERTION VENTRICULAR-PERITONEAL;  Surgeon: Lisbeth Renshaw, MD;  Location: [facility];  Service: Neurosurgery;  Laterality: Right;    Medications: Current Outpatient Medications  Medication Sig Dispense Refill   acetaminophen (TYLENOL) 325 MG tablet Take 2 tablets (650 mg total) by mouth 3 (three) times daily.     alendronate (FOSAMAX) 10 MG tablet Take 10 mg by mouth daily before breakfast. Take with a full glass of water on an empty stomach.     artificial tears (LACRILUBE) OINT ophthalmic ointment Place into both eyes 2 (two) times daily.     aspirin EC 81 MG tablet Take 1 tablet (81 mg total) by mouth daily. Swallow whole. 30 tablet 12   calcium carbonate (TUMS - DOSED IN MG ELEMENTAL CALCIUM) 500 MG chewable tablet Chew 1 tablet (200 mg of elemental calcium total) by mouth daily with supper.     Cholecalciferol (VITAMIN D3) 50 MCG (2000 UT) TABS Take 2,000 Units by mouth daily.     famotidine (PEPCID) 40 MG tablet 1 tablet Orally Once a day for 30 days     folic acid (FOLVITE) 1 MG tablet Take 1 mg by mouth daily.     methotrexate (RHEUMATREX) 2.5 MG tablet Take 20 mg by mouth once a week. Takes 8 tablets(20mg )  every Saturday.     Caution:Chemotherapy. Protect from light.     metoprolol succinate (TOPROL-XL) 25 MG 24 hr tablet TAKE 1 TABLET DAILY 90 tablet 3   Multiple Vitamins-Minerals (PRESERVISION AREDS 2 PO) Take 2 capsules by mouth daily.     predniSONE (STERAPRED UNI-PAK 21 TAB) 5 MG (21) TBPK tablet Take 5 mg by mouth every morning.     RITUXAN 500 MG/50ML injection See admin instructions. Every 6 months 2 dose infusion Ruxience     rosuvastatin (CRESTOR) 20 MG tablet TAKE 1 TABLET DAILY (DOSE INCREASE) 90 tablet 2   sertraline (ZOLOFT) 20 MG/ML concentrated solution Take 25 mg by mouth daily.     spironolactone (ALDACTONE) 25 MG tablet 12.5 mg. 1/2 tab daily     sulfaSALAzine (AZULFIDINE) 500 MG tablet Take 1,000 mg by mouth 2 (two) times daily.     traMADol (ULTRAM) 50 MG tablet Take 1 tablet (50 mg total) by mouth every 12 (twelve) hours as needed for moderate pain. (Patient taking differently: Take 50 mg by mouth as needed for moderate pain (pain score 4-6).) 60 tablet 2   traZODone (DESYREL) 50 MG tablet TAKE 1.5 TABLETS (75 MG TOTAL) BY MOUTH AT BEDTIME AS NEEDED. FOR SLEEP 135 tablet 5   White Petrolatum-Mineral Oil (GENTEAL TEARS NIGHT-TIME OP) Apply to eye at bedtime.     No current facility-administered medications for this visit.    Allergies  Allergen Reactions   Hydroxychloroquine     Other Reaction(s): cardiolmyopathy    Diagnoses:  Major depression  Plan of Care: Outpatient psychotherapy and medication management as needed. Initial Note: (With wife Christopher Yu). She says that she has hoped Christopher Yu would get into therapy. He has Ataxic Tremor. He told  Dr. Arbutus Leas that he would like medication to just sit and do nothing. He says he always had golf to rey on and now cannot play. In December, he went n for a surgery to remove an avm from brain. Was told it was a routine surgery and was told that he needs to get it out. Was told it would be a short recovery and he would be back to work. 1  1/2 hour surgery turned into 6 hour surgery and he had complications. He had extra cerebral fluid and Dr. York Spaniel it would absorb and just wait. He kept declining and by March, they went in to try and drain the extra fluid. He was in hospital for a week. Wife noticed that during the hospital stay, his speech declined and he was clearly weak. Nurses were trying to discharge him and she protested, but they sent him home anyway. He declined, and could not sit, stand, talk or walk and she ended up calling ambulance. In ER, she ws told he had a stroke. He was in ER 24 hours before getting a room in Neuro area. Head of stroke team came in and said it was not a stroke, but complications from surgery. She was blamed by medical team for not "speaking up" before being initially discharged. He ended up in a rehab for 2 weeks and told them he needed another surgery. Was told he had hydrocephalus and need a shunt to drain liquid off his brain. She felt a lot of pressure from team that he needed surgery. She wanted a second opinion and had rouble getting it. Spoke to another Dr. and they decided to get the third surgery. He was in OT,PT and Speech Therapy. During therapy, he started having a tremor. They ended up being referred to Dr. Arbutus Leas, who has now told him that treating his tremor is tricky and will take time.  Brace is no longer in OT,PT speech therapy. He is supposed to be doing exercises on his own, but has not been emotionally able to ge himself to do it. He has persistent headaches as well. Just had a CAT Scan and awaiting the results. His headaches are worse and they are concerned. He also has rhumatory arthritis and it is flaring up because he had to stop meds. He has cardiomyopathy, macular degeneration on top of all of these other conditions.  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 He has been approved for disability and are working to get social  security disability. She works part time. They have 2 daughters, one with diabetes and arthritis. She is heading to [employer] this Fall and wants to work in Research scientist (physical sciences). Other daughter is 25 and is "our introvert with lots of anxiety". She is in therapy and has mental health issues that is being treated. She will be a Holiday representative at the middle college at [employer].  They are told that medicine will not control his condition, but they will have to try to retrain his brain. Suggested that they work to get back into OT and PT. She has a hard time motivating him. He admits that he keeps things "inside". She says he is not a good comunicator. He will not share things like if he is in pain or hurts himself.   Tx. Plan/Goals: Patient reports depressive symptoms secondary to the medical trauma he has suffered and the debilitating physical results. He is seeking therapy  to resolve his feelings of helplessness and hopelessness. Also want assistance in improving his drive/motivation in daily activity. Will engage in therapy that utilizes behavioral approaches to mitigate symptoms along with insight oriented counseling. Will also incorporate some conjoint sessions with spouse as needed, given her integral involvement with his care. Patient agrees with plan for treatment. Goal Date is 12-25    Patient agrees to be seen for a video session and understands the limitations of the platform. He is at home and provider is in home office.  Session Note:  Patient states he has not done anything with golf because he says "I suck". He has a hard time playing and is increasingly frustrated with the inability to play well. He enjoys being with his friends but doesn't feel his level of play will be satisfying anytime soon. He says the primary problem for him is his balance. He says that "if I don't enjoy it I tend to avoid it". He is pessimistic that his golf game will come back. Doesn't believe that his fatigue or balance will get better.  Talked about clarifying goals and expectations for the new year. Also, focus on gratitude during the holiday season.                                              Garrel Ridgel, PhD 4:15p-5:00 45 minutes

## 2023-04-28 NOTE — Progress Notes (Signed)
[facility] Christopher Yu 62 year old male 11-29-1960   HOME SLEEP TEST REPORT ( by Watch PAT)   STUDY DATE:  Data load on 04-28-2023/ mail out device    PRIMARY NEUROLOGIST: Neurologist : Dr Arbutus Leas, DO , seen just 3 months ago for ataxic Tremor.   REFERRING CLINICIAN: PCP Sneja     CLINICAL INFORMATION/HISTORY: AVM brain, referred by Dr Shearon Stalls, DO .Christopher Yu is a 62 y.o. male patient who is seen upon referral on 03/05/2023 from Rehab physician Dr Shearon Stalls , for constant fatigue since having suffered a stroke in March 2024.  He had also 3 brain surgeries  for AVM, pseudo-meningocele, NPH , VP shunt - prior to the stroke, has been diagnosed with ataxic Tremor after the stroke ( referred by [facility]).  Dr Tat has told the patient that there is little benefit from any medication. He is depressed as he can't GOLF any longer. The patient goes to bed at 11-12 PM and has difficulties to fall asleep- CHRONIC INSOMNIA for ever , continues to sleep for 90 minutes  , then wakes for  bathroom break, then sleeps again 3 hours.        Chief concern according to patient :  " I just sleep poorly, I had sleep studies 10-15 year ago, first one was positive for apnea, the second one was negative. Snoring, my wife witnesses pausing".       Epworth sleepiness score: 3/ 24 points   FSS endorsed at 57/ 63 points.    BMI: 22.5 kg/m   Neck Circumference: 15.5"    FINDINGS:   Sleep Summary:   Total Recording Time (hours, min):   7 hours 49 minutes  Total Sleep Time (hours, min):    7 hours 25 minutes             Percent REM (%):   16%   This patient had a sleep latency of 16 minutes, REM sleep latency of only 30 minutes and only 7 minutes of wakefulness after sleep onset                                    Respiratory Indices:   Calculated pAHI (per hour): 68.1/h      -50% of events were central sleep apnea the other health is presumed obstructive in  origin.                     REM pAHI:   58.7/h                                              NREM pAHI: 70/h                            Supine AHI: 61.7/h versus right lateral sleep was 77.2/h, left lateral sleep with 67.2/h.  There was no positional dependence noted.  Snoring level reached a mean volume of 43 dB and was present for about a third of the total recorded sleep time.  Oxygen Saturation Statistics:   Oxygen Saturation (%) Mean: 91%              Minimum oxygen saturation (%):    At nadir 72%        O2 Saturation Range (%):    Between 72 and 98%                                   O2 Saturation (minutes) <89%:     72.3 minutes/more than 16% of total sleep time.      Pulse Rate Statistics:   Pulse Mean (bpm):   67 bpm              Pulse Range:   Between 48 and 93/min              IMPRESSION:  This HST confirms the presence of complex sleep apnea with 50% of apneas being central in origin.  The overall apnea hypopnea index was of high severity, and there was significant hypoxia associated.     RECOMMENDATION: Given the very high proportion of central apnea, this patient should return to the sleep lab for an in lab titration.  There is a high index of suspicion that CPAP will fail him and therefore an in-lab titration will allow [place] to switch to BiPAP or ASV. I have ordered a follow-up study in the sleep lab.    INTERPRETING PHYSICIAN:   Melvyn Novas, MD  [facility] Board certified by [employer] Medicine  and Diplomate of the [employer] of Sleep Medicine. Board certified In Neurology through the ABPN, Fellow of the [employer] of Neu[employer].

## 2023-05-03 NOTE — Procedures (Signed)
[facility] Christopher Yu 62 year old male 08/28/1960   HOME SLEEP TEST REPORT ( by Watch PAT)   STUDY DATE:  Data load on 04-28-2023/ mail out device    PRIMARY NEUROLOGIST: Neurologist : Dr Arbutus Leas, DO ,  Neurology, seen just 3 months ago for ataxic Tremor.   REFERRING CLINICIAN: PCP Bennye Alm      CLINICAL INFORMATION/HISTORY: AVM brain, referred by Dr Shearon Stalls, DO .Christopher Yu is a 62 y.o. male patient who is seen upon referral on 03/05/2023 from Rehab physician Dr Shearon Stalls , for constant fatigue since having suffered a stroke in March 2024.  [name] had undergone 3 brain surgeries  for AVM, pseudo-meningocele, NPH , VP shunt - prior to the stroke, has been diagnosed with ataxic Tremor after the stroke ( referred by [facility]).  Dr Tat has told the patient that there is little benefit from any medication. He is depressed as he can't GOLF any longer. The patient goes to bed at 11-12 PM and has difficulties to fall asleep- CHRONIC INSOMNIA for ever , continues to sleep for 90 minutes  , then wakes for  bathroom break, then sleeps again 3 hours.        Chief concern according to patient :  " I just sleep poorly, I had sleep studies 10-15 year ago, first one was positive for apnea, the second one was negative. Snoring, my wife witnesses pausing".       Epworth sleepiness score: 3/ 24 points   FSS endorsed at 57/ 63 points.    BMI: 22.5 kg/m   Neck Circumference: 15.5"    FINDINGS:   Sleep Summary:   Total Recording Time (hours, min):   7 hours 49 minutes  Total Sleep Time (hours, min):    7 hours 25 minutes             Percent REM (%):   16%   This patient had a sleep latency of 16 minutes, REM sleep latency of only 30 minutes and only 7 minutes of wakefulness after sleep onset                                    Respiratory Indices:   Calculated pAHI (per hour): 68.1/h      -50% of events were central sleep  apnea the other health is presumed obstructive in origin.                     REM pAHI:   58.7/h                                              NREM pAHI: 70/h                            Supine AHI: 61.7/h versus right lateral sleep was 77.2/h, left lateral sleep with 67.2/h.  There was no positional dependence noted.  Snoring level reached a mean volume of 43 dB and was present for about a third of the total recorded sleep time.  Oxygen Saturation Statistics:   Oxygen Saturation (%) Mean: 91%              Minimum oxygen saturation (%):    At nadir 72%        O2 Saturation Range (%):    Between 72 and 98%                                   O2 Saturation (minutes) <89%:     72.3 minutes/more than 16% of total sleep time.      Pulse Rate Statistics:   Pulse Mean (bpm):   67 bpm              Pulse Range:   Between 48 and 93/min              IMPRESSION:  This HST confirms the presence of complex sleep apnea with 50% of apneas being central in origin.  The overall apnea hypopnea index was of high severity, and there was significant hypoxia associated.     RECOMMENDATION: Given the very high proportion of central apnea, this patient should return to the sleep lab for an in lab titration.  There is a high index of suspicion that CPAP will fail him and therefore an in-lab titration will allow [place] to switch to BiPAP or ASV. I have ordered a follow-up study in the sleep lab.    INTERPRETING PHYSICIAN:   Melvyn Novas, MD  [facility] Board certified by The [employer] of Sleep Medicine  and Diplomate of the [employer] Sleep Medicine. Board certified In Neurology through the ABPN, Fellow of the [employer] Neu[employer].

## 2023-05-04 ENCOUNTER — Encounter
Payer: BC Managed Care – PPO | Attending: Physical Medicine and Rehabilitation | Admitting: Physical Medicine and Rehabilitation

## 2023-05-04 VITALS — BP 117/79 | HR 73 | Ht 74.0 in | Wt 180.0 lb

## 2023-05-04 DIAGNOSIS — G4700 Insomnia, unspecified: Secondary | ICD-10-CM

## 2023-05-04 DIAGNOSIS — R1310 Dysphagia, unspecified: Secondary | ICD-10-CM | POA: Diagnosis not present

## 2023-05-04 DIAGNOSIS — Z9889 Other specified postprocedural states: Secondary | ICD-10-CM | POA: Diagnosis not present

## 2023-05-04 DIAGNOSIS — I639 Cerebral infarction, unspecified: Secondary | ICD-10-CM

## 2023-05-04 DIAGNOSIS — M069 Rheumatoid arthritis, unspecified: Secondary | ICD-10-CM | POA: Diagnosis not present

## 2023-05-04 NOTE — Progress Notes (Unsigned)
Subjective:    Patient ID: Christopher Yu, male    DOB: 1960/09/15, 62 y.o.   MRN: [id number]  HPI  Christopher Yu is a 62 y.o. year old male  who  has a past medical history of Anxiety, Arthritis, rheumatoid (HCC), CAD (coronary artery disease), native coronary artery, Cardiomyopathy (HCC), Cardiomyopathy (HCC), Chronic systolic heart failure (HCC) (61/98/1191), Depression, Deviated nasal septum (06/25/2011), Headache, High cholesterol, Hyperlipidemia, Impingement syndrome of left shoulder (12/22/2017), Macular degeneration, Nausea and vomiting (08/28/2022), Rheumatoid aortitis, Sleep apnea, Sleep apnea in adult, and Status post total right knee replacement (06/01/2015).   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 Conchita Yu.  .  Plan from last visit:  Chronic pain syndrome Rheumatoid arthritis of other site, unspecified whether rheumatoid factor present (HCC)   When you are starting to run low on tramadol, message me through MyChart and let me know if it is adequately controlling your headaches and joint pains.  If not, we may switch to Norco 5 mg twice daily as needed. - Received refill request from patient 10/2; refilled tramadol   Watch out for potential medication side effects of tramadol and your antidepressants, including itchiness, irritability, overheating, and nausea.   Chronic headache - bilateral, waxing/waning, intermittent, responsive to tramadol Please resume gabapentin 300 mg twice daily as prescribed.  Pain medication as above   Cerebellar stroke (HCC) Status post craniotomy I will look into contacts for a referral for recreational therapy to try and improve getting you  back to golfing.  In the meantime, please do your home exercises and continue to stay active as able. **10/10: messaged patient regarding resources for Christopher Yu - Christopher Yu and Christopher Yu and Christopher Yu activities calendar. Completed referral inquiry for for [facility] services   I will fill out the social services/disability form you gave me today and message you through MyChart when it is done.  You will need to come to the office to pick it up and there will be a $20 fee at that time.              - Filled out 10/2, at front desk   Follow-up with me in 3 months.     Insomnia, unspecified type -     Ambulatory referral to Sleep Studies I am sending a referral for a formal sleep study given history of snoring and severe fatigue.   Interval Hx:  - Therapies:  Recreational golfing information gotten - he is planning on reaching out to one of his old instructors and setting him up with other people who have had a stroke. "I got a guy at my friend's golf course and he plays in different events, and he has parkinson's, so I'm going to Yu out to him".   Dizziness when he initially stands up, but not double vision. Wife also notes when he changes positions, he feels like he is going to fall. "That is why I like shoes that I dont tie. "  He has a HEP - "I do laps around my dining room kitchen area". It's a challenge with his dog, who likes to "get in my way".    - Follow ups: Dr. Conchita Yu saw him 03/24/23  and recoemmended once yearly follow ups.    - Falls: none recent   - DME: He's been using his walker more recently because he has been feeling unsteady; he intermittently uses the walking stick.    - Medications: Using tramadol "maybe once per day", mostly for joint pain in his hands. He's currently in the middle of an RA flare. "I think it is effective but I try to follow my sheet from Dr. Margaretann Yu". Does have hydrocodone for severe pain, but  "almost never" uses it.   Dr. Margaretann Yu did change his meds to Lexapro 20 mg daily as well.    - Other concerns: Sleep study results severe apnea. Formal study ordered per neurology. Patient stating trazodone 75 mg not helpful for initiating or staying asleep. Patient is napping a lot during the day as well, "I take a nap because of the headaches." He generally lays down at 2-4 pm.   Wife notices that he is choking more when he drinks and eats; "even when he is eating it's happening more often." In the morning, his speech gets more slurred.   Pain Inventory Average Pain 7 Pain Right Now 7 My pain is  unsure  In the last 24 hours, has pain interfered with the following? General activity 0 Relation with others 0 Enjoyment of life 0 What TIME of day is your pain at its worst? morning  and daytime Sleep (in general) Fair  Pain is worse with: walking, bending, and standing Pain improves with: rest, heat/ice, and medication Relief from Meds: 0  Family History  Problem Relation Age of Onset   Other Father        MVA   Lupus Sister    Social History   Socioeconomic History   Marital status: Married    Spouse name: Christopher Yu   Number of children: 4   Years of education: Not on file   Highest education level: Not on file  Occupational History   Occupation: disabled    Comment: projected Production designer, theatre/television/film  Tobacco Use   Smoking status: Former    Current packs/day: 0.00    Average packs/day: 0.5 packs/day for 20.0 years (10.0 ttl pk-yrs)    Types: Cigarettes    Start date: 05/24/1988    Quit date: 05/24/2008    Years since quitting: 14.9   Smokeless tobacco: Never   Tobacco comments:    stopped smoking 8 yrs ago.  Vaping Use   Vaping status: Never Used  Substance and Sexual Activity   Alcohol use: Yes    Comment: rarely   Drug use: No   Sexual activity: Not Currently  Other Topics Concern   Not on file  Social History Narrative   Right hand   Social Drivers of Health    Financial Resource Strain: Not on file  Food Insecurity: No Food Insecurity (07/29/2022)   Hunger Vital Sign    Worried About Running Out of Food in the Last Year: Never true    Ran Out of Food in the Last Year: Never true  Transportation Needs: No Transportation Needs (07/29/2022)   PRAPARE - Administrator, Civil Service (Medical): No    Lack of Transportation (Non-Medical): No  Physical Activity: Not on file  Stress: Not on file  Social Connections: Unknown (04/29/2022)   [facility]   Social Network    Social Network: Not on file   Past Surgical History:  Procedure Laterality Date   ANKLE FUSION Right  05/05/2012   related to his arthritis   ANKLE FUSION Left 05/25/2013   related to his arthritis   ANKLE SURGERY Bilateral    APPLICATION OF CRANIAL NAVIGATION N/A 04/11/2022   Procedure: APPLICATION OF CRANIAL NAVIGATION;  Surgeon: Lisbeth Renshaw, MD;  Location: [facility] OR;  Service: Neurosurgery;  Laterality: N/A;   APPLICATION OF CRANIAL NAVIGATION N/A 08/08/2022   Procedure: APPLICATION OF CRANIAL NAVIGATION;  Surgeon: Lisbeth Renshaw, MD;  Location: [facility] OR;  Service: Neurosurgery;  Laterality: N/A;   CARDIAC CATHETERIZATION N/A 05/01/2015   Procedure: Left Heart Cath and Coronary Angiography;  Surgeon: Lyn Records, MD;  Location: [facility] INVASIVE CV LAB;  Service: Cardiovascular;  Laterality: N/A;   CRANIOTOMY N/A 04/11/2022   Procedure: STEREOTACTIC SUBOCCIPITAL CRANIOTOMY FOR RESECTION OF ARTERIO-VENOUS MALFORMATION;  Surgeon: Lisbeth Renshaw, MD;  Location: [facility] OR;  Service: Neurosurgery;  Laterality: N/A;   FRACTURE SURGERY     left femur fracture x 3   IR ANGIO EXTERNAL CAROTID SEL EXT CAROTID BILAT MOD SED  01/28/2022   IR ANGIO INTRA EXTRACRAN SEL INTERNAL CAROTID BILAT MOD SED  01/28/2022   IR ANGIO INTRA EXTRACRAN SEL INTERNAL CAROTID BILAT MOD SED  04/16/2022   IR ANGIO VERTEBRAL SEL VERTEBRAL BILAT MOD SED  01/28/2022   IR  ANGIO VERTEBRAL SEL VERTEBRAL BILAT MOD SED  04/16/2022   KNEE ARTHROSCOPY     right x 4   LAPAROSCOPIC REVISION VENTRICULAR-PERITONEAL (V-P) SHUNT N/A 08/08/2022   Procedure: LAPAROSCOPIC VENTRICULAR-PERITONEAL (V-P) SHUNT;  Surgeon: Harriette Bouillon, MD;  Location: [facility] OR;  Service: General;  Laterality: N/A;   LUMBAR LAMINECTOMY/DECOMPRESSION MICRODISCECTOMY N/A 07/18/2022   Procedure: Repair of Pseudomeningiocele posterior;  Surgeon: Lisbeth Renshaw, MD;  Location: [facility];  Service: Neurosurgery;  Laterality: N/A;   NASAL SEPTOPLASTY W/ TURBINOPLASTY  06/25/2011   Procedure: NASAL SEPTOPLASTY WITH TURBINATE REDUCTION;  Surgeon: Osborn Coho, MD;  Location: [facility] OR;  Service: ENT;  Laterality: Bilateral;   PLACEMENT OF LUMBAR DRAIN N/A 07/18/2022   Procedure: PLACEMENT OF LUMBAR DRAIN;  Surgeon: Lisbeth Renshaw, MD;  Location: [facility] OR;  Service: Neurosurgery;  Laterality: N/A;   TONSILLECTOMY     TOTAL KNEE ARTHROPLASTY Right 06/01/2015   Procedure: RIGHT TOTAL KNEE ARTHROPLASTY;  Surgeon: Kathryne Hitch, MD;  Location: [facility] ORS;  Service: Orthopedics;  Laterality: Right;  Block+general   VENTRICULOPERITONEAL SHUNT Right 08/08/2022   Procedure: LAP ASSITED SHUNT INSERTION VENTRICULAR-PERITONEAL;  Surgeon: Lisbeth Renshaw, MD;  Location: [facility];  Service: Neurosurgery;  Laterality: Right;   Past Surgical History:  Procedure Laterality Date   ANKLE FUSION Right 05/05/2012   related to his arthritis   ANKLE FUSION Left 05/25/2013   related to his arthritis   ANKLE SURGERY Bilateral    APPLICATION OF CRANIAL NAVIGATION N/A 04/11/2022   Procedure: APPLICATION OF CRANIAL NAVIGATION;  Surgeon: Lisbeth Renshaw, MD;  [facility]: [facility];  Service: Neurosurgery;  Laterality: N/A;   APPLICATION OF CRANIAL NAVIGATION N/A 08/08/2022   Procedure: APPLICATION OF CRANIAL NAVIGATION;  Surgeon: Lisbeth Renshaw, MD;  [facility]: [facility];  Service: Neurosurgery;  Laterality: N/A;   CARDIAC CATHETERIZATION  N/A 05/01/2015   Procedure: Left Heart Cath and Coronary Angiography;  Surgeon: Lyn Records, MD;  Location: [facility] INVASIVE CV LAB;  Service: Cardiovascular;  Laterality: N/A;   CRANIOTOMY N/A 04/11/2022   Procedure: STEREOTACTIC SUBOCCIPITAL CRANIOTOMY FOR RESECTION OF ARTERIO-VENOUS MALFORMATION;  Surgeon: Lisbeth Renshaw, MD;  [facility];  Service: Neurosurgery;  Laterality: N/A;   FRACTURE SURGERY     left  femur fracture x 3   IR ANGIO EXTERNAL CAROTID SEL EXT CAROTID BILAT MOD SED  01/28/2022   IR ANGIO INTRA EXTRACRAN SEL INTERNAL CAROTID BILAT MOD SED  01/28/2022   IR ANGIO INTRA EXTRACRAN SEL INTERNAL CAROTID BILAT MOD SED  04/16/2022   IR ANGIO VERTEBRAL SEL VERTEBRAL BILAT MOD SED  01/28/2022   IR ANGIO VERTEBRAL SEL VERTEBRAL BILAT MOD SED  04/16/2022   KNEE ARTHROSCOPY     right x 4   LAPAROSCOPIC REVISION VENTRICULAR-PERITONEAL (V-P) SHUNT N/A 08/08/2022   Procedure: LAPAROSCOPIC VENTRICULAR-PERITONEAL (V-P) SHUNT;  Surgeon: Harriette Bouillon, MD;  Location: [facility];  Service: General;  Laterality: N/A;   LUMBAR LAMINECTOMY/DECOMPRESSION MICRODISCECTOMY N/A 07/18/2022   Procedure: Repair of Pseudomeningiocele posterior;  Surgeon: Lisbeth Renshaw, MD;  Location: [facility];  Service: Neurosurgery;  Laterality: N/A;   NASAL SEPTOPLASTY W/ TURBINOPLASTY  06/25/2011   Procedure: NASAL SEPTOPLASTY WITH TURBINATE REDUCTION;  Surgeon: Osborn Coho, MD;  Location: [facility] OR;  Service: ENT;  Laterality: Bilateral;   PLACEMENT OF LUMBAR DRAIN N/A 07/18/2022   Procedure: PLACEMENT OF LUMBAR DRAIN;  Surgeon: Lisbeth Renshaw, MD;  Location: [facility];  Service: Neurosurgery;  Laterality: N/A;   TONSILLECTOMY     TOTAL KNEE ARTHROPLASTY Right 06/01/2015   Procedure: RIGHT TOTAL KNEE ARTHROPLASTY;  Surgeon: Kathryne Hitch, MD;  Location: [facility] ORS;  Service: Orthopedics;  Laterality: Right;  Block+general   VENTRICULOPERITONEAL SHUNT Right 08/08/2022   Procedure: LAP ASSITED SHUNT INSERTION  VENTRICULAR-PERITONEAL;  Surgeon: Lisbeth Renshaw, MD;  Location: [facility];  Service: Neurosurgery;  Laterality: Right;   Past Medical History:  Diagnosis Date   Anxiety    Arthritis, rheumatoid (HCC)    dx 1988   CAD (coronary artery disease), native coronary artery    Mild LAD disease with calcification noted at cath 12//27/16    Cardiomyopathy (HCC)    Cardiomyopathy (HCC)    Chronic systolic heart failure (HCC) 05/01/2015   Depression    Deviated nasal septum 06/25/2011   Headache    High cholesterol    Hyperlipidemia    Impingement syndrome of left shoulder 12/22/2017   Macular degeneration    Nausea and vomiting 08/28/2022   Rheumatoid aortitis    Sleep apnea    does not use cpap - UNABLE TO TOLERATE MASK   Sleep apnea in adult    deviated septum repaired, most recent sleep study was negative   Status post total right knee replacement 06/01/2015   Ht 6\' 2"  (1.88 m)   Wt 180 lb (81.6 kg)   BMI 23.11 kg/m   Opioid Risk Score:   Fall Risk Score:  `1  Depression screen [facility] 2/9     05/04/2023    8:55 AM 10/20/2022    8:51 AM  Depression screen PHQ 2/9  Decreased Interest 0 1  Down, Depressed, Hopeless 0 1  PHQ - 2 Score 0 2      Review of Systems  All other systems reviewed and are negative.     Objective:   Physical Exam  Ataxia UE improved; ataxia LE ongoing Ny mystagmus UE fine motor limited 5/5 strength throughout Dysarthria Stable wide based gait pattern with cane in R hand     Assessment & Plan:   Christopher Yu is a 62 y.o. year old male  who  has a past medical history of Anxiety, Arthritis, rheumatoid (HCC), CAD (coronary artery disease), native coronary artery, Cardiomyopathy (HCC), Cardiomyopathy (HCC), Chronic systolic heart failure (HCC) (44/05/270), Depression, Deviated nasal  septum (06/25/2011), Headache, High cholesterol, Hyperlipidemia, Impingement syndrome of left shoulder (12/22/2017), Macular degeneration, Nausea and  vomiting (08/28/2022), Rheumatoid aortitis, Sleep apnea, Sleep apnea in adult, and Status post total right knee replacement (06/01/2015).   They are presenting to PM&R clinic as a new patient for treatment of *** . They were referred by *** . Based on their presentation, *** .  Status post craniotomy  Cerebellar stroke (HCC)  Dysphagia, unspecified type  Insomnia, unspecified type  Rheumatoid arthritis of other site, unspecified whether rheumatoid factor present (HCC)

## 2023-05-04 NOTE — Patient Instructions (Signed)
Message me 2 weeks to let me know how your mood is going; if things are stable at that time, we will pull back on Trazadone and talk about switching over to mirtazepine for appetite and sleep.  I am referring you back to SLP for swallow evaluation and possible MBS.   Look into golf resources with your friend; I will hold off on PT or OT referrals unless you feel this I not beneficial, we can refer you to somewhere more sports oriented next time.   I would also support you going to the gym if desired.   Get sleep evaluation per neurology.  Follow up in 3 months.

## 2023-05-05 DIAGNOSIS — M810 Age-related osteoporosis without current pathological fracture: Secondary | ICD-10-CM | POA: Diagnosis not present

## 2023-05-05 DIAGNOSIS — M0579 Rheumatoid arthritis with rheumatoid factor of multiple sites without organ or systems involvement: Secondary | ICD-10-CM | POA: Diagnosis not present

## 2023-05-05 DIAGNOSIS — Z79899 Other long term (current) drug therapy: Secondary | ICD-10-CM | POA: Diagnosis not present

## 2023-05-05 DIAGNOSIS — I429 Cardiomyopathy, unspecified: Secondary | ICD-10-CM | POA: Diagnosis not present

## 2023-05-05 DIAGNOSIS — M79643 Pain in unspecified hand: Secondary | ICD-10-CM | POA: Diagnosis not present

## 2023-05-13 ENCOUNTER — Ambulatory Visit: Payer: BC Managed Care – PPO | Admitting: Psychology

## 2023-05-14 ENCOUNTER — Telehealth: Payer: Self-pay | Admitting: *Deleted

## 2023-05-14 DIAGNOSIS — D509 Iron deficiency anemia, unspecified: Secondary | ICD-10-CM | POA: Diagnosis not present

## 2023-05-14 NOTE — Telephone Encounter (Addendum)
 Spoke to pt and wife on speaker phone  Gave sleep study results . Pt and wife aware that pt has to come back  to do in lab titration  study Pt does have  trazodone  75 mg  nightly for sleep aid  Will let sleep lab know about sleep aid . Pt and wife expressed understanding and thanked me for calling Pt is aware sleep lad will call to set up in lab titration

## 2023-05-14 NOTE — Telephone Encounter (Signed)
-----   Message from [place] D[place]ier sent at 05/03/2023 11:51 PM EST ----- 50% central sleep apnea, severe sleep apnea, hypoxia and needs to come in for CPAP, BiPAP and if needed 02 , ASV.  The patient needs to tell us  if he needs a sleep aid as we rely on him being able to arrive early and sleep to get through the titration.  If the patient is followed by pulmonology, inquirer about Oximetry while walking-  oxygen may be prescribed by pulmonology , in some cases by cardiology.   Sleep study was ordered , TY

## 2023-05-19 ENCOUNTER — Telehealth: Payer: Self-pay | Admitting: Neurology

## 2023-05-19 NOTE — Telephone Encounter (Signed)
 Noted thank you

## 2023-05-19 NOTE — Telephone Encounter (Signed)
 CPAP [employer] [place]: [id number] (exp. 05/19/23 to 11/14/23)

## 2023-05-19 NOTE — Telephone Encounter (Signed)
 CPAP [employer] pending faxed notes.

## 2023-05-20 ENCOUNTER — Ambulatory Visit: Payer: BC Managed Care – PPO | Admitting: Psychology

## 2023-05-20 DIAGNOSIS — F329 Major depressive disorder, single episode, unspecified: Secondary | ICD-10-CM | POA: Diagnosis not present

## 2023-05-20 DIAGNOSIS — F321 Major depressive disorder, single episode, moderate: Secondary | ICD-10-CM

## 2023-05-20 NOTE — Progress Notes (Signed)
  Behavioral Health Counselor Initial Adult Exam  Name: Christopher Yu Date: 05/20/2023 MRN: [id number] DOB: 1961/03/12 PCP: Tena Feeling, MD    Guardian/Payee:  N/A    Paperwork requested: No   Reason for Visit Christopher Yu Problem: Adjustment to serious medical illness  Mental Status Exam: Appearance:   Casual     Behavior:  Appropriate  Motor:  Shuffling Gait  Speech/Language:   Slurred  Affect:  Depressed  Mood:  depressed  Thought process:  normal  Thought content:    WNL  Sensory/Perceptual disturbances:    WNL  Orientation:  oriented to person, place, and situation  Attention:  Good  Concentration:  Good  Memory:  WNL  Fund of knowledge:   Good  Insight:    Good  Judgment:   unknown  Impulse Control:  unknown    Reported Symptoms:  Sadness, helplessness and hopelessness  Risk Assessment: Danger to Self:  No Self-injurious Behavior: No Danger to Others: No Duty to Warn:no Physical Aggression / Violence:No  Access to Firearms a concern:  Unknown Gang Involvement:No  Patient / guardian was educated about steps to take if suicide or homicide risk level increases between visits: n/a While future psychiatric events cannot be accurately predicted, the patient does not currently require acute inpatient psychiatric care and does not currently meet [facility]  involuntary commitment criteria.  Substance Abuse History: Current substance abuse: No     Past Psychiatric History:    No previous psychological problems have been observed Outpatient Providers:N/A History of Psych Hospitalization: No  Psychological Testing:  none    Abuse History:  Victim of:   N/A   Report needed: No. Victim of Neglect:No. Perpetrator of  N/A   Witness / Exposure to Domestic Violence: No   Protective Services Involvement: No  Witness to [employer] Violence:  No   Family History:  Family History  Problem Relation Age of Onset   Other Father        MVA   Lupus Sister     Living situation: the patient lives with their family  Sexual Orientation:  Straight  Relationship Status: married  Name of spouse [name] If a parent, number of children / ages:Two girls, ages 58 and 38  Support Systems: spouse  Surveyor, quantity Stress:  Yes   Income/Employment/Disability: Software engineer:  unknown  Educational History: Education:  unknown  Oncologist: unknown  Any cultural differences that may affect / interfere with treatment:  not applicable   Recreation/Hobbies: golfing  Stressors: Financial difficulties   Health problems    Strengths: Family  Barriers:  severe physical limitations that compromises activity   Legal History: Pending legal issue / charges: The patient has no significant history of legal issues. History of legal issue / charges:  N/A  Medical History/Surgical History: reviewed Past Medical History:  Diagnosis Date   Anxiety    Arthritis, rheumatoid (HCC)    dx 1988   CAD (coronary artery disease), native coronary artery    Mild LAD disease with calcification noted at cath 12//27/16    Cardiomyopathy (HCC)    Cardiomyopathy (HCC)    Chronic systolic heart failure (HCC) 05/01/2015   Depression    Deviated nasal septum 06/25/2011   Headache    High cholesterol    Hyperlipidemia    Impingement syndrome of left shoulder 12/22/2017   Macular degeneration    Nausea and vomiting 08/28/2022   Rheumatoid aortitis     Sleep apnea    does not use cpap - UNABLE TO TOLERATE MASK   Sleep apnea in adult    deviated septum repaired, most recent sleep study was negative   Status post total right knee replacement 06/01/2015    Past Surgical History:  Procedure Laterality Date   ANKLE FUSION Right 05/05/2012   related to his arthritis   ANKLE FUSION Left 05/25/2013   related to his arthritis   ANKLE SURGERY Bilateral    APPLICATION OF CRANIAL NAVIGATION N/A 04/11/2022   Procedure: APPLICATION OF CRANIAL NAVIGATION;  Surgeon: Augusto Blonder, MD;  Location: [facility] OR;  Service: Neurosurgery;  Laterality: N/A;   APPLICATION OF CRANIAL NAVIGATION N/A 08/08/2022   Procedure: APPLICATION OF CRANIAL NAVIGATION;  Surgeon: Augusto Blonder, MD;  Location: [facility] OR;  Service: Neurosurgery;  Laterality: N/A;   CARDIAC CATHETERIZATION N/A 05/01/2015   Procedure: Left Heart Cath and Coronary Angiography;  Surgeon: Arty Binning, MD;  Location: [facility] INVASIVE CV LAB;  Service: Cardiovascular;  Laterality: N/A;   CRANIOTOMY N/A 04/11/2022   Procedure: STEREOTACTIC SUBOCCIPITAL CRANIOTOMY FOR RESECTION OF ARTERIO-VENOUS MALFORMATION;  Surgeon: Augusto Blonder, MD;  Location: [facility] OR;  Service: Neurosurgery;  Laterality: N/A;   FRACTURE SURGERY     left femur fracture x 3   IR ANGIO EXTERNAL CAROTID SEL EXT CAROTID BILAT MOD SED  01/28/2022   IR ANGIO INTRA EXTRACRAN SEL INTERNAL CAROTID BILAT MOD SED  01/28/2022   IR ANGIO INTRA EXTRACRAN SEL INTERNAL CAROTID BILAT MOD SED  04/16/2022   IR ANGIO VERTEBRAL SEL VERTEBRAL BILAT MOD SED  01/28/2022   IR ANGIO VERTEBRAL SEL VERTEBRAL BILAT MOD SED  04/16/2022   KNEE ARTHROSCOPY     right x 4   LAPAROSCOPIC REVISION VENTRICULAR-PERITONEAL (V-P) SHUNT N/A 08/08/2022   Procedure: LAPAROSCOPIC VENTRICULAR-PERITONEAL (V-P) SHUNT;  Surgeon: Sim Dryer, MD;  Location: [facility] OR;  Service: General;  Laterality: N/A;   LUMBAR LAMINECTOMY/DECOMPRESSION MICRODISCECTOMY N/A 07/18/2022    Procedure: Repair of Pseudomeningiocele posterior;  Surgeon: Augusto Blonder, MD;  Location: [facility] OR;  Service: Neurosurgery;  Laterality: N/A;   NASAL SEPTOPLASTY W/ TURBINOPLASTY  06/25/2011   Procedure:  NASAL SEPTOPLASTY WITH TURBINATE REDUCTION;  Surgeon: Ammon Bales, MD;  Location: [facility] OR;  Service: ENT;  Laterality: Bilateral;   PLACEMENT OF LUMBAR DRAIN N/A 07/18/2022   Procedure: PLACEMENT OF LUMBAR DRAIN;  Surgeon: Augusto Blonder, MD;  Location: [facility] OR;  Service: Neurosurgery;  Laterality: N/A;   TONSILLECTOMY     TOTAL KNEE ARTHROPLASTY Right 06/01/2015   Procedure: RIGHT TOTAL KNEE ARTHROPLASTY;  Surgeon: Arnie Lao, MD;  Location: [facility] ORS;  Service: Orthopedics;  Laterality: Right;  Block+general   VENTRICULOPERITONEAL SHUNT Right 08/08/2022   Procedure: LAP ASSITED SHUNT INSERTION VENTRICULAR-PERITONEAL;  Surgeon: Augusto Blonder, MD;  Location: [facility] OR;  Service: Neurosurgery;  Laterality: Right;    Medications: Current Outpatient Medications  Medication Sig Dispense Refill   acetaminophen  (TYLENOL ) 325 MG tablet Take 2 tablets (650 mg total) by mouth 3 (three) times daily.     alendronate (FOSAMAX) 10 MG tablet Take 10 mg by mouth daily before breakfast. Take with a full glass of water  on an empty stomach.     artificial tears (LACRILUBE) OINT ophthalmic ointment Place into both eyes 2 (two) times daily.     aspirin  EC 81 MG tablet Take 1 tablet (81 mg total) by mouth daily. Swallow whole. 30 tablet 12   calcium  carbonate (TUMS - DOSED IN MG ELEMENTAL CALCIUM ) 500 MG chewable tablet Chew 1 tablet (200 mg of elemental calcium  total) by mouth daily with supper.     Cholecalciferol (VITAMIN D3) 50 MCG (2000 UT) TABS Take 2,000 Units by mouth daily.     famotidine (PEPCID) 40 MG tablet 1 tablet Orally Once a day for 30 days     folic acid  (FOLVITE ) 1 MG tablet Take 1 mg by mouth daily.     methotrexate  (RHEUMATREX) 2.5 MG tablet Take 20 mg by mouth once a week. Takes  8 tablets(20mg ) every Saturday.     Caution:Chemotherapy. Protect from light.     metoprolol  succinate (TOPROL -XL) 25 MG 24 hr tablet TAKE 1 TABLET DAILY 90 tablet 3   Multiple Vitamins-Minerals (PRESERVISION AREDS 2 PO) Take 2 capsules by mouth daily.     predniSONE  (STERAPRED UNI-PAK 21 TAB) 5 MG (21) TBPK tablet Take 5 mg by mouth every morning.     RITUXAN  500 MG/50ML injection See admin instructions. Every 6 months 2 dose infusion Ruxience      rosuvastatin  (CRESTOR ) 20 MG tablet TAKE 1 TABLET DAILY (DOSE INCREASE) 90 tablet 2   sertraline  (ZOLOFT ) 20 MG/ML concentrated solution Take 25 mg by mouth daily.     spironolactone  (ALDACTONE ) 25 MG tablet 12.5 mg. 1/2 tab daily     sulfaSALAzine  (AZULFIDINE ) 500 MG tablet Take 1,000 mg by mouth 2 (two) times daily.     traMADol  (ULTRAM ) 50 MG tablet Take 1 tablet (50 mg total) by mouth every 12 (twelve) hours as needed for moderate pain. (Patient taking differently: Take 50 mg by mouth as needed for moderate pain (pain score 4-6).) 60 tablet 2   traZODone  (DESYREL ) 50 MG tablet TAKE 1.5 TABLETS (75 MG TOTAL) BY MOUTH AT BEDTIME AS NEEDED. FOR SLEEP 135 tablet 5   White Petrolatum -Mineral Oil (GENTEAL TEARS NIGHT-TIME OP) Apply to eye at bedtime.     No current facility-administered medications for this visit.    Allergies  Allergen Reactions   Hydroxychloroquine      Other Reaction(s): cardiolmyopathy    Diagnoses:  Major depression  Plan of Care: Outpatient psychotherapy and medication management as needed. Initial Note: (With wife [name]). She says  that she has hoped Christohper would get into therapy. He has Ataxic Tremor. He told Dr. Winferd Hatter that he would like medication to just sit and do nothing. He says he always had golf to rey on and now cannot play. In December, he went n for a surgery to remove an avm from brain. Was told it was a routine surgery and was told that he needs to get it out. Was told it would be a short recovery and he would be  back to work. 1 1/2 hour surgery turned into 6 hour surgery and he had complications. He had extra cerebral fluid and Dr. Arnetta Lank it would absorb and just wait. He kept declining and by March, they went in to try and drain the extra fluid. He was in hospital for a week. Wife noticed that during the hospital stay, his speech declined and he was clearly weak. Nurses were trying to discharge him and she protested, but they sent him home anyway. He declined, and could not sit, stand, talk or walk and she ended up calling ambulance. In ER, she ws told he had a stroke. He was in ER 24 hours before getting a room in Neuro area. Head of stroke team came in and said it was not a stroke, but complications from surgery. She was blamed by medical team for not "speaking up" before being initially discharged. He ended up in a rehab for 2 weeks and told them he needed another surgery. Was told he had hydrocephalus and need a shunt to drain liquid off his brain. She felt a lot of pressure from team that he needed surgery. She wanted a second opinion and had rouble getting it. Spoke to another Dr. and they decided to get the third surgery. He was in OT,PT and Speech Therapy. During therapy, he started having a tremor. They ended up being referred to Dr. Winferd Hatter, who has now told him that treating his tremor is tricky and will take time.  [place] is no longer in OT,PT speech therapy. He is supposed to be doing exercises on his own, but has not been emotionally able to ge himself to do it. He has persistent headaches as well. Just had a CAT Scan and awaiting the results. His headaches are worse and they are concerned. He also has rhumatory arthritis and it is flaring up because he had to stop meds. He has cardiomyopathy, macular degeneration on top of all of these other conditions.  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 He has been approved for disability and are working  to get social security disability. She works part time. They have 2 daughters, one with diabetes and arthritis. She is heading to [employer] this Fall and wants to work in Research scientist (physical sciences). Other daughter is 70 and is "our introvert with lots of anxiety". She is in therapy and has mental health issues that is being treated. She will be a Holiday representative at the middle college at [employer].  They are told that medicine will not control his condition, but they will have to try to retrain his brain. Suggested that they work to get back into OT and PT. She has a hard time motivating him. He admits that he keeps things "inside". She says he is not a good comunicator. He will not share things like if he is in pain or hurts himself.   Tx. Plan/Goals: Patient reports depressive symptoms secondary to  the medical trauma he has suffered and the debilitating physical results. He is seeking therapy to resolve his feelings of helplessness and hopelessness. Also want assistance in improving his drive/motivation in daily activity. Will engage in therapy that utilizes behavioral approaches to mitigate symptoms along with insight oriented counseling. Will also incorporate some conjoint sessions with spouse as needed, given her integral involvement with his care. Patient agrees with plan for treatment. Goal Date is 12-25    Patient agrees to be seen for a video session and understands the limitations of the platform. He is at home and provider is in home office.  Session Note:  Patient says he had a good holiday. He will be meeting with a mental health evaluation at the request of social security disability. His father in law died unexpectedly from a heart attack this morning. He was at a retirement facility locally. This will be a stressful time for the family. We talked about how to support his wife. Also, trying to find activities for himself.                                                 Jola Nash, PhD 4:15p-5:00 45  minutes

## 2023-05-20 NOTE — Telephone Encounter (Signed)
 LVM for pt to call back to schedule.

## 2023-05-27 ENCOUNTER — Ambulatory Visit: Payer: BC Managed Care – PPO | Admitting: Psychology

## 2023-05-27 DIAGNOSIS — F339 Major depressive disorder, recurrent, unspecified: Secondary | ICD-10-CM | POA: Diagnosis not present

## 2023-05-27 DIAGNOSIS — G4733 Obstructive sleep apnea (adult) (pediatric): Secondary | ICD-10-CM | POA: Diagnosis not present

## 2023-05-27 DIAGNOSIS — M069 Rheumatoid arthritis, unspecified: Secondary | ICD-10-CM | POA: Diagnosis not present

## 2023-05-27 DIAGNOSIS — D509 Iron deficiency anemia, unspecified: Secondary | ICD-10-CM | POA: Diagnosis not present

## 2023-05-27 NOTE — Telephone Encounter (Signed)
CPAP [employer] Berkley Harvey: [id number] (exp. 05/19/23 to 11/14/23)   I spoke with the patient wife [name] who is on [place].   He is scheduled at [facility] for 06/08/23 at 8 pm.  Sent mychart message with informatio n& mailed packet.

## 2023-06-02 ENCOUNTER — Other Ambulatory Visit: Payer: Self-pay | Admitting: Gastroenterology

## 2023-06-02 DIAGNOSIS — D509 Iron deficiency anemia, unspecified: Secondary | ICD-10-CM | POA: Diagnosis not present

## 2023-06-02 DIAGNOSIS — R1310 Dysphagia, unspecified: Secondary | ICD-10-CM | POA: Diagnosis not present

## 2023-06-02 NOTE — Telephone Encounter (Signed)
Patient wife [name] called stating due to another doctor appt and scheduling conflicts she need to r/s his SS.  He is r/s for 06/15/23 at 8 pm. Mailed new packet to the patient.

## 2023-06-03 ENCOUNTER — Ambulatory Visit (INDEPENDENT_AMBULATORY_CARE_PROVIDER_SITE_OTHER): Payer: BC Managed Care – PPO | Admitting: Psychology

## 2023-06-03 DIAGNOSIS — F321 Major depressive disorder, single episode, moderate: Secondary | ICD-10-CM

## 2023-06-03 NOTE — Progress Notes (Signed)
[facility] Counselor Initial Adult Exam  Name: Christopher Yu Date: 06/03/2023 MRN: [id number] DOB: Mar 30, 1961 PCP: Thana Ates, MD    Guardian/Payee:  N/A    Paperwork requested: No   Reason for Visit Christopher Yu Problem: Adjustment to serious medical illness  Mental Status Exam: Appearance:   Casual     Behavior:  Appropriate  Motor:  Shuffling Gait  Speech/Language:   Slurred  Affect:  Depressed  Mood:  depressed  Thought process:  normal  Thought content:    WNL  Sensory/Perceptual disturbances:    WNL  Orientation:  oriented to person, place, and situation  Attention:  Good  Concentration:  Good  Memory:  WNL  Fund of knowledge:   Good  Insight:    Good  Judgment:   unknown  Impulse Control:  unknown    Reported Symptoms:  Sadness, helplessness and hopelessness  Risk Assessment: Danger to Self:  No Self-injurious Behavior: No Danger to Others: No Duty to Warn:no Physical Aggression / Violence:No  Access to Firearms a concern:  Unknown Gang Involvement:No  Patient / guardian was educated about steps to take if suicide or homicide risk level increases between visits: n/a While future psychiatric events cannot be accurately predicted, the patient does not currently require acute inpatient psychiatric care and does not currently meet [facility] involuntary commitment criteria.  Substance Abuse History: Current substance abuse: No     Past Psychiatric History:    No previous psychological problems have been observed Outpatient Providers:N/A History of Psych Hospitalization: No  Psychological Testing:  none    Abuse History:  Victim of:   N/A   Report needed: No. Victim of Neglect:No. Perpetrator of  N/A   Witness / Exposure to Domestic Violence: No   Protective Services Involvement: No  Witness to [employer] Violence:  No   Family History:  Family History  Problem Relation Age of Onset   Other Father        MVA   Lupus Sister     Living situation: the patient lives with their family  Sexual Orientation:  Straight  Relationship Status: married  Name of spouse [name] If a parent, number of children / ages:Two girls, ages 45 and 23  Support Systems: spouse  Surveyor, quantity Stress:  Yes   Income/Employment/Disability: Software engineer:  unknown  Educational History: Education:  unknown  Oncologist: unknown  Any cultural differences that may affect / interfere with treatment:  not applicable   Recreation/Hobbies: golfing  Stressors: Financial difficulties   Health problems    Strengths: Family  Barriers:  severe physical limitations that compromises activity   Legal History: Pending legal issue / charges: The patient has no significant history of legal issues. History of legal issue / charges:  N/A  Medical History/Surgical History: reviewed Past Medical History:  Diagnosis Date   Anxiety    Arthritis, rheumatoid (HCC)    dx 1988   CAD (coronary artery disease), native coronary artery    Mild LAD disease with calcification noted at cath 12//27/16    Cardiomyopathy (HCC)    Cardiomyopathy (HCC)    Chronic systolic heart failure (HCC) 05/01/2015   Depression    Deviated nasal septum 06/25/2011   Headache    High cholesterol    Hyperlipidemia    Impingement syndrome of left shoulder 12/22/2017   Macular degeneration    Nausea and vomiting 08/28/2022   Rheumatoid aortitis     Sleep apnea    does not use cpap - UNABLE TO TOLERATE MASK   Sleep apnea in adult    deviated septum repaired, most recent sleep study was negative   Status post total right knee replacement 06/01/2015    Past Surgical History:  Procedure Laterality Date   ANKLE FUSION Right 05/05/2012   related to his arthritis   ANKLE FUSION Left 05/25/2013   related to his arthritis   ANKLE SURGERY Bilateral    APPLICATION OF CRANIAL NAVIGATION N/A 04/11/2022   Procedure: APPLICATION OF CRANIAL NAVIGATION;  Surgeon: Lisbeth Renshaw, MD;  Location: [facility] OR;  Service: Neurosurgery;  Laterality: N/A;   APPLICATION OF CRANIAL NAVIGATION N/A 08/08/2022   Procedure: APPLICATION OF CRANIAL NAVIGATION;  Surgeon: Lisbeth Renshaw, MD;  Location: [facility] OR;  Service: Neurosurgery;  Laterality: N/A;   CARDIAC CATHETERIZATION N/A 05/01/2015   Procedure: Left Heart Cath and Coronary Angiography;  Surgeon: Lyn Records, MD;  Location: [facility] INVASIVE CV LAB;  Service: Cardiovascular;  Laterality: N/A;   CRANIOTOMY N/A 04/11/2022   Procedure: STEREOTACTIC SUBOCCIPITAL CRANIOTOMY FOR RESECTION OF ARTERIO-VENOUS MALFORMATION;  Surgeon: Lisbeth Renshaw, MD;  Location: [facility] OR;  Service: Neurosurgery;  Laterality: N/A;   FRACTURE SURGERY     left femur fracture x 3   IR ANGIO EXTERNAL CAROTID SEL EXT CAROTID BILAT MOD SED  01/28/2022   IR ANGIO INTRA EXTRACRAN SEL INTERNAL CAROTID BILAT MOD SED  01/28/2022   IR ANGIO INTRA EXTRACRAN SEL INTERNAL CAROTID BILAT MOD SED  04/16/2022   IR ANGIO VERTEBRAL SEL VERTEBRAL BILAT MOD SED  01/28/2022   IR ANGIO VERTEBRAL SEL VERTEBRAL BILAT MOD SED  04/16/2022   KNEE ARTHROSCOPY     right x 4   LAPAROSCOPIC REVISION VENTRICULAR-PERITONEAL (V-P) SHUNT N/A 08/08/2022   Procedure: LAPAROSCOPIC VENTRICULAR-PERITONEAL (V-P) SHUNT;  Surgeon: Harriette Bouillon, MD;  Location: [facility] OR;  Service: General;  Laterality: N/A;   LUMBAR LAMINECTOMY/DECOMPRESSION MICRODISCECTOMY N/A 07/18/2022    Procedure: Repair of Pseudomeningiocele posterior;  Surgeon: Lisbeth Renshaw, MD;  Location: [facility] OR;  Service: Neurosurgery;  Laterality: N/A;   NASAL SEPTOPLASTY W/ TURBINOPLASTY  06/25/2011   Procedure:  NASAL SEPTOPLASTY WITH TURBINATE REDUCTION;  Surgeon: Osborn Coho, MD;  Location[facility] OR;  Service: ENT;  Laterality: Bilateral;   PLACEMENT OF LUMBAR DRAIN N/A 07/18/2022   Procedure: PLACEMENT OF LUMBAR DRAIN;  Surgeon: Lisbeth Renshaw, MD;  Location: [facility] OR;  Service: Neurosurgery;  Laterality: N/A;   TONSILLECTOMY     TOTAL KNEE ARTHROPLASTY Right 06/01/2015   Procedure: RIGHT TOTAL KNEE ARTHROPLASTY;  Surgeon: Kathryne Hitch, MD;  Location: [facility] ORS;  Service: Orthopedics;  Laterality: Right;  Block+general   VENTRICULOPERITONEAL SHUNT Right 08/08/2022   Procedure: LAP ASSITED SHUNT INSERTION VENTRICULAR-PERITONEAL;  Surgeon: Lisbeth Renshaw, MD;  Location: [facility] OR;  Service: Neurosurgery;  Laterality: Right;    Medications: Current Outpatient Medications  Medication Sig Dispense Refill   acetaminophen (TYLENOL) 325 MG tablet Take 2 tablets (650 mg total) by mouth 3 (three) times daily.     alendronate (FOSAMAX) 10 MG tablet Take 10 mg by mouth daily before breakfast. Take with a full glass of water on an empty stomach.     artificial tears (LACRILUBE) OINT ophthalmic ointment Place into both eyes 2 (two) times daily.     aspirin EC 81 MG tablet Take 1 tablet (81 mg total) by mouth daily. Swallow whole. 30 tablet 12   calcium carbonate (TUMS - DOSED IN MG ELEMENTAL CALCIUM) 500 MG chewable tablet Chew 1 tablet (200 mg of elemental calcium total) by mouth daily with supper.     Cholecalciferol (VITAMIN D3) 50 MCG (2000 UT) TABS Take 2,000 Units by mouth daily.     famotidine (PEPCID) 40 MG tablet 1 tablet Orally Once a day for 30 days     folic acid (FOLVITE) 1 MG tablet Take 1 mg by mouth daily.     methotrexate (RHEUMATREX) 2.5 MG tablet Take 20 mg by mouth once a week. Takes  8 tablets(20mg ) every Saturday.     Caution:Chemotherapy. Protect from light.     metoprolol succinate (TOPROL-XL) 25 MG 24 hr tablet TAKE 1 TABLET DAILY 90 tablet 3   Multiple Vitamins-Minerals (PRESERVISION AREDS 2 PO) Take 2 capsules by mouth daily.     predniSONE (STERAPRED UNI-PAK 21 TAB) 5 MG (21) TBPK tablet Take 5 mg by mouth every morning.     RITUXAN 500 MG/50ML injection See admin instructions. Every 6 months 2 dose infusion Ruxience     rosuvastatin (CRESTOR) 20 MG tablet TAKE 1 TABLET DAILY (DOSE INCREASE) 90 tablet 2   sertraline (ZOLOFT) 20 MG/ML concentrated solution Take 25 mg by mouth daily.     spironolactone (ALDACTONE) 25 MG tablet 12.5 mg. 1/2 tab daily     sulfaSALAzine (AZULFIDINE) 500 MG tablet Take 1,000 mg by mouth 2 (two) times daily.     traMADol (ULTRAM) 50 MG tablet Take 1 tablet (50 mg total) by mouth every 12 (twelve) hours as needed for moderate pain. (Patient taking differently: Take 50 mg by mouth as needed for moderate pain (pain score 4-6).) 60 tablet 2   traZODone (DESYREL) 50 MG tablet TAKE 1.5 TABLETS (75 MG TOTAL) BY MOUTH AT BEDTIME AS NEEDED. FOR SLEEP 135 tablet 5   White Petrolatum-Mineral Oil (GENTEAL TEARS NIGHT-TIME OP) Apply to eye at bedtime.     No current facility-administered medications for this visit.    Allergies  Allergen Reactions   Hydroxychloroquine     Other Reaction(s): cardiolmyopathy    Diagnoses:  Major depression  Plan of Care: Outpatient psychotherapy and medication management as needed. Initial Note: (With wife [name]). She says  that she has hoped Christopher Yu would get into therapy. He has Ataxic Tremor. He told Dr. Arbutus Leas that he would like medication to just sit and do nothing. He says he always had golf to rey on and now cannot play. In December, he went n for a surgery to remove an avm from brain. Was told it was a routine surgery and was told that he needs to get it out. Was told it would be a short recovery and he would be  back to work. 1 1/2 hour surgery turned into 6 hour surgery and he had complications. He had extra cerebral fluid and Dr. York Spaniel it would absorb and just wait. He kept declining and by March, they went in to try and drain the extra fluid. He was in hospital for a week. Wife noticed that during the hospital stay, his speech declined and he was clearly weak. Nurses were trying to discharge him and she protested, but they sent him home anyway. He declined, and could not sit, stand, talk or walk and she ended up calling ambulance. In ER, she ws told he had a stroke. He was in ER 24 hours before getting a room in Neuro area. Head of stroke team came in and said it was not a stroke, but complications from surgery. She was blamed by medical team for not "speaking up" before being initially discharged. He ended up in a rehab for 2 weeks and told them he needed another surgery. Was told he had hydrocephalus and need a shunt to drain liquid off his brain. She felt a lot of pressure from team that he needed surgery. She wanted a second opinion and had rouble getting it. Spoke to another Dr. and they decided to get the third surgery. He was in OT,PT and Speech Therapy. During therapy, he started having a tremor. They ended up being referred to Dr. Arbutus Leas, who has now told him that treating his tremor is tricky and will take time.  Christopher Yu is no longer in OT,PT speech therapy. He is supposed to be doing exercises on his own, but has not been emotionally able to ge himself to do it. He has persistent headaches as well. Just had a CAT Scan and awaiting the results. His headaches are worse and they are concerned. He also has rhumatory arthritis and it is flaring up because he had to stop meds. He has cardiomyopathy, macular degeneration on top of all of these other conditions.  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 He has been approved for disability and are working  to get social security disability. She works part time. They have 2 daughters, one with diabetes and arthritis. She is heading to [employer] this Fall and wants to work in Research scientist (physical sciences). Other daughter is 65 and is "our introvert with lots of anxiety". She is in therapy and has mental health issues that is being treated. She will be a Holiday representative at the middle college at [employer].  They are told that medicine will not control his condition, but they will have to try to retrain his brain. Suggested that they work to get back into OT and PT. She has a hard time motivating him. He admits that he keeps things "inside". She says he is not a good comunicator. He will not share things like if he is in pain or hurts himself.   Tx. Plan/Goals: Patient reports depressive symptoms secondary to  the medical trauma he has suffered and the debilitating physical results. He is seeking therapy to resolve his feelings of helplessness and hopelessness. Also want assistance in improving his drive/motivation in daily activity. Will engage in therapy that utilizes behavioral approaches to mitigate symptoms along with insight oriented counseling. Will also incorporate some conjoint sessions with spouse as needed, given her integral involvement with his care. Patient agrees with plan for treatment. Goal Date is 12-25    Patient agrees to be seen in the provider's office. Session Note:  Patient states he has been busy. He has a number of medical appointments this week and next week. He also has the funeral for his father in law on Friday. He has an infusion set up for tomorrow and it takes 7 hours. This is his first one since his stroke and is to help his arthritis. Christopher Yu is looking forward to father in law service because some of the people there are those he wants to confront.  We talked about reading to use some of his available time. He says it is difficult to find something that gets his attention. He is not able to determine  how to  spend time at home when alone.                                                 Garrel Ridgel, PhD 3:05p-4:00 55 minutes                     Garrel Ridgel, PhD

## 2023-06-04 ENCOUNTER — Encounter (HOSPITAL_COMMUNITY): Payer: Self-pay | Admitting: Gastroenterology

## 2023-06-04 DIAGNOSIS — M0579 Rheumatoid arthritis with rheumatoid factor of multiple sites without organ or systems involvement: Secondary | ICD-10-CM | POA: Diagnosis not present

## 2023-06-08 NOTE — H&P (Signed)
History of Present Illness General:  63 year old male with  past medical history of Anxiety, Arthritis, rheumatoid (HCC), CAD (coronary artery disease), native coronary artery, Cardiomyopathy (HCC), Cardiomyopathy (HCC), Chronic systolic heart failure (HCC) (44/05/270), Depression, Deviated nasal septum (06/25/2011), Headache, High cholesterol, Hyperlipidemia, Impingement syndrome of left shoulder (12/22/2017), Macular degeneration, Nausea and vomiting (08/28/2022), Rheumatoid aortitis, Sleep apnea, Sleep apnea in adult, and Status post total right knee replacement (06/01/2015), cerebellar AVM s/p resection 04/2022, pseudomeningocele s/p repair 07/18/22,hospitalization,secondary cerebellar stroke and hydrocephalus s/p VP shunt. He was getting shots for testosterone and got an MRI to evaluate the pituitary but was noted to have an AVM incidentally.  Referred for iron deficiency anemia. Labs 05/14/2023 showed low ferritin of 6.7, ferritin was 15.3 in 7/24 Labs/24/24 showed hemoglobin 10.5 with MCV 75.2 Colonoscopy, 2015, Dr. Madilyn Fireman, screening: Diverticulosis in sigmoid, otherwise unremarkable, repeat recommended in 10 years.  He has 1 Bm every other day, denies blood in stool,denies black stools. He has started oral iron every other day. He has mild abdominal pain, denies nausea, vomiting, he has acid reflux and heartburn and takes famotidine 40 mg a day, worsened the past year. He has lost a significant amount of weight , from 215 lbs to 180s lbs, and has decreased appetite, he is trying to increase his protein intake. He has trouble swallowing, he was aspirating with liquids after his stroke, he has to turn his head to left to swallow, he was supposed to have another swallow study, both with solids and liquids. He walks around his house, for balance outside he uses a walking stick or cane, has an unsteady gait, as per his wife he has ataxic tremor in his left hand.  Current Medications Escitalopram  Oxalate 20 MG Tablet TAKE 1 TABLET BY MOUTH EVERY DAY Famotidine 40 MG Tablet TAKE 1 TABLET BY MOUTH EVERY DAY FOR 30 DAYS Gabapentin 300 MG Capsule 1 capsule Orally twice a day HYDROcodone-Acetaminophen 5-325 MG Tablet 1 tablet as needed Orally every 12 hrs traMADol HCl 50 MG Tablet 1 tablet as needed for moderate pain Orally every 6 hrs (Dx: M06.9) As needed Calcium Carbonate 1250 (500 Ca) MG Tablet Chewable 1 tablet with supper Orally once a day Diclofenac Sodium 1 % Gel 2 g Externally four times a day PreserVision AREDS(Multiple Vitamins-Minerals) - Capsule 2 capsules Orally 1 time daily Methotrexate 2.5 MG Tablet 8 tabs once a week Aldactone(Spironolactone) 25 MG Tablet 1/2 tablet Orally once a day , Notes to Pharmacist: 12.5mg  sulfaSALAzine 500 MG Tablet 2 tablet Orally BID Vitamin D3 50 MCG (2000 UT) Tablet 1 tablet Orally Once a day Alendronate Sodium 70 MG Tablet 1 tablet 30 minutes before the first food, beverage or medicine of the day with plain water Orally , Notes to Pharmacist: Dr. Deanne Coffer rheum Aspirin 81 MG Tablet Delayed Release 1 tablet Orally Once a day , Notes to Pharmacist: changed by hospital predniSONE 5 MG Tablet 1 tablet Orally Once a day traZODone HCl 50 MG Tablet 1.5 tablets at bedtime as needed Orally Once a day Folic Acid 1 MG Tablet 1 tablet Orally Once a day Orencia , Notes to Pharmacist: 1x week Metoprolol Succinate 25 MG Tablet Extended Release 24 Hour 0.5 tablet Orally Once a day Multivitamin Iron (Ferrous Sulfate) 325 (65 Fe) MG Tablet 1 tablet Orally every other day Rosuvastatin Calcium 20 MG Tablet 1 tablet Orally Once a day , Notes to Pharmacist: Strength ? Vitamin B12 1000 MCG Tablet Extended Release 3 tablets Orally once a week Acetaminophen 500  MG Tablet 1 tablets Orally two times a day As needed, Notes to Pharmacist: Decreased by hospital   Past Medical History RA--RF+, CCP+, nodular, erosive rheumatoid arthritis. Lack of efficacy of Humira, Orencia,  on Enbrel, prior inj MTX with injection reaction and oral ulcers, tol oral MTX aryal, [place]. right knee DJD, [place]. Hyperlipidemia. Insomnia. Herpes zoster 2007, postherpetic neuralgia. osteopenia, T. -1.7 hip. Status post smoker. Fracture right great toe 2008. mild cardiomyopathy, possibly due to plaquenils, cardiac catheteization minimal plaque LADm ejection fraction 40%, , varanasi. obstructive sleep apnea severe, 2012, did not tolerate CPAP. Macular degeneration, bowen. Osteoporosis, T -2.5 right femoral neck 2023, aryal. AVM s/p repair complicated by stroke 2024. Central sleep apnea - Dr. Vickey Huger pending BiPAP titration 05/2023.  Surgical History arthroscopy right knee 1994, 1999 fracture left hip with rod 1986 and rod removed 1990 vasectomy, [place] 1/11 R knee arthroscopy, Graves 3/11 R foot fusion, Scott baptis 2014 L foot fusion, Scott 2015 (R) knee x3 in [place] sinus Colonoscopy 01/2014 right total knee replacement, [place] January 2017 Craniotomy for resection of AVM - Dr. Conchita Paris 04/2022;2024 placement of lumbar drain, repair of pos fossa pseudomeningocele - Dr. Conchita Paris 07/2022 VP shunt placement - Dr. Conchita Paris 08/2022 stroke 07/2022  Family History Father: deceased 49 yrs, MVA Mother: alive 80 yrs, old age Paternal Grand Father: deceased Paternal Grand Mother: deceased Maternal Grand Father: deceased Maternal Grand Mother: deceased Brother 1: alive 40 yrs Sister 1: alive 52 yrs, Lupus with renal involvement Sister 2: alive 4 yrs Sister 3: alive 87 yrs Maternal uncle: alive, cad 25s Maternal aunt: deceased, diagnosed with Colon cancer Daughter(s): type 1 diabetes, juvenile idiopathic arthritis 1 brother(s) , 3 sister(s) . 4 daughter(s) . No Family History of Colon Cancer, Polyps, or Liver Disease sister has kidney disease, transplant and on dyalisis.  Social History    General:  Tobacco use  cigarettes: Former smoker Quit in year  2009 Pack-year Hx: 10 Tobacco history last updated 06/02/2023 Additional Findings: Tobacco Non-User Ex-moderate cigarette smoker (10-19/day) EXPOSURE TO PASSIVE SMOKE: no. Alcohol: yes, occasionally. Caffeine: 2+ servings daily, coffee. Recreational drug use: no. Exercise: walking 2x a week. Marital Status: Divorced and remarried. OCCUPATION: labwear, software, home, Emergency planning/management officer.  Allergies Plaquenil: cardiolmyopathy Zofran: prolonged qt interval Hospitalization/Major Diagnostic Procedure sinus (L) foot VP shunt placement and crainiotomy 08/2022 none since 08/2022 05/2023   Vital Signs Wt: 185.0, Wt change: -2.2 lbs, Ht: 73.5, BMI: 24.07, Temp: 98.1, Pulse sitting: 86, BP sitting: 110/76. Examination GENERAL APPEARANCE:  Well developed, well nourished, no active distress, pleasant, muffled speech.  SCLERA:  anicteric.  CARDIOVASCULAR  Normal RRR .  RESPIRATORY  Breath sounds normal. Respiration even and unlabored.  ABDOMEN  No masses palpated. Liver and spleen not palpated, normal. Bowel sounds normal, Abdomen not distended.  EXTREMITIES:  No edema.  NEURO:  alert, oriented to time, place and person, uses a cane for ambulation.  PSYCH:  mood/affect normal.   Assessments 1. IDA (iron deficiency anemia) - D50.9 (Primary)    2. Dysphagia - R13.10      Treatment 1. IDA (iron deficiency anemia) EGD (Ordered for 06/02/2023) Colonoscopy (Ordered for 06/02/2023) Notes: Since patient has unsteady gait and balance issues, I discussed about difficulty with colonic prep. As per his wife, a bedside commode can be arranged at home. He wants to take Sutab prep(he has difficulty swallowing liquids and does better swallowing pills). Since he has history of obstructive sleep apnea, neurologic surgeries performed more than 6 months ago, recommend diagnostic  endoscopy and colonoscopy to be performed as an outpatient in the hospital.    2. Dysphagia    Notes: This is likely related to  his stroke but if an obvious narrowing or a stricture is noted during endoscopy, I plan on performing balloon dilation if needed.

## 2023-06-09 ENCOUNTER — Other Ambulatory Visit: Payer: Self-pay

## 2023-06-09 ENCOUNTER — Encounter (HOSPITAL_COMMUNITY): Payer: Self-pay | Admitting: Gastroenterology

## 2023-06-09 ENCOUNTER — Encounter (HOSPITAL_COMMUNITY)
Admission: RE | Disposition: A | Discharge status: Home / Self Care | Payer: Self-pay | Source: Home / Self Care | Attending: Gastroenterology

## 2023-06-09 ENCOUNTER — Ambulatory Visit (HOSPITAL_COMMUNITY)
Admission: RE | Admit: 2023-06-09 | Discharge: 2023-06-09 | Disposition: A | Discharge status: Home / Self Care | Payer: BC Managed Care – PPO | Attending: Gastroenterology | Admitting: Gastroenterology

## 2023-06-09 ENCOUNTER — Ambulatory Visit (HOSPITAL_COMMUNITY): Payer: Self-pay | Admitting: Anesthesiology

## 2023-06-09 DIAGNOSIS — D123 Benign neoplasm of transverse colon: Secondary | ICD-10-CM | POA: Diagnosis not present

## 2023-06-09 DIAGNOSIS — K317 Polyp of stomach and duodenum: Secondary | ICD-10-CM | POA: Diagnosis not present

## 2023-06-09 DIAGNOSIS — D509 Iron deficiency anemia, unspecified: Secondary | ICD-10-CM | POA: Insufficient documentation

## 2023-06-09 DIAGNOSIS — I2510 Atherosclerotic heart disease of native coronary artery without angina pectoris: Secondary | ICD-10-CM | POA: Insufficient documentation

## 2023-06-09 DIAGNOSIS — K31A12 Gastric intestinal metaplasia without dysplasia, involving the body (corpus): Secondary | ICD-10-CM | POA: Insufficient documentation

## 2023-06-09 DIAGNOSIS — K2950 Unspecified chronic gastritis without bleeding: Secondary | ICD-10-CM | POA: Diagnosis not present

## 2023-06-09 DIAGNOSIS — I429 Cardiomyopathy, unspecified: Secondary | ICD-10-CM | POA: Diagnosis not present

## 2023-06-09 DIAGNOSIS — Z79899 Other long term (current) drug therapy: Secondary | ICD-10-CM | POA: Insufficient documentation

## 2023-06-09 DIAGNOSIS — Z982 Presence of cerebrospinal fluid drainage device: Secondary | ICD-10-CM | POA: Diagnosis not present

## 2023-06-09 DIAGNOSIS — R1310 Dysphagia, unspecified: Secondary | ICD-10-CM | POA: Diagnosis not present

## 2023-06-09 DIAGNOSIS — I5022 Chronic systolic (congestive) heart failure: Secondary | ICD-10-CM | POA: Insufficient documentation

## 2023-06-09 DIAGNOSIS — M069 Rheumatoid arthritis, unspecified: Secondary | ICD-10-CM | POA: Diagnosis not present

## 2023-06-09 DIAGNOSIS — Z87891 Personal history of nicotine dependence: Secondary | ICD-10-CM | POA: Insufficient documentation

## 2023-06-09 DIAGNOSIS — K3189 Other diseases of stomach and duodenum: Secondary | ICD-10-CM | POA: Diagnosis not present

## 2023-06-09 DIAGNOSIS — I110 Hypertensive heart disease with heart failure: Secondary | ICD-10-CM | POA: Insufficient documentation

## 2023-06-09 DIAGNOSIS — F32A Depression, unspecified: Secondary | ICD-10-CM | POA: Insufficient documentation

## 2023-06-09 DIAGNOSIS — F419 Anxiety disorder, unspecified: Secondary | ICD-10-CM | POA: Diagnosis not present

## 2023-06-09 DIAGNOSIS — E7800 Pure hypercholesterolemia, unspecified: Secondary | ICD-10-CM | POA: Insufficient documentation

## 2023-06-09 DIAGNOSIS — K648 Other hemorrhoids: Secondary | ICD-10-CM | POA: Insufficient documentation

## 2023-06-09 DIAGNOSIS — G919 Hydrocephalus, unspecified: Secondary | ICD-10-CM | POA: Diagnosis not present

## 2023-06-09 DIAGNOSIS — Z96651 Presence of right artificial knee joint: Secondary | ICD-10-CM | POA: Insufficient documentation

## 2023-06-09 DIAGNOSIS — Z8673 Personal history of transient ischemic attack (TIA), and cerebral infarction without residual deficits: Secondary | ICD-10-CM | POA: Diagnosis not present

## 2023-06-09 DIAGNOSIS — G4733 Obstructive sleep apnea (adult) (pediatric): Secondary | ICD-10-CM | POA: Insufficient documentation

## 2023-06-09 DIAGNOSIS — K5730 Diverticulosis of large intestine without perforation or abscess without bleeding: Secondary | ICD-10-CM | POA: Diagnosis not present

## 2023-06-09 HISTORY — PX: HEMOSTASIS CLIP PLACEMENT: SHX6857

## 2023-06-09 HISTORY — PX: COLONOSCOPY WITH PROPOFOL: SHX5780

## 2023-06-09 HISTORY — PX: BIOPSY: SHX5522

## 2023-06-09 HISTORY — PX: POLYPECTOMY: SHX5525

## 2023-06-09 HISTORY — PX: ESOPHAGOGASTRODUODENOSCOPY (EGD) WITH PROPOFOL: SHX5813

## 2023-06-09 SURGERY — ESOPHAGOGASTRODUODENOSCOPY (EGD) WITH PROPOFOL
Anesthesia: Monitor Anesthesia Care

## 2023-06-09 MED ORDER — SODIUM CHLORIDE 0.9% FLUSH: 3.0000 mL | Freq: Two times a day (BID) | INTRAVENOUS | Status: DC

## 2023-06-09 MED ORDER — FLEET ENEMA RE ENEM
ENEMA | RECTAL | Status: AC
  Filled 2023-06-09: qty 1

## 2023-06-09 MED ORDER — PROPOFOL 10 MG/ML IV BOLUS
INTRAVENOUS | Status: DC | PRN
  Administered 2023-06-09 (×2): 50 mg via INTRAVENOUS
  Administered 2023-06-09: 30 mg via INTRAVENOUS
  Administered 2023-06-09: 70 mg via INTRAVENOUS
  Administered 2023-06-09 (×2): 40 mg via INTRAVENOUS
  Administered 2023-06-09: 120 mg via INTRAVENOUS

## 2023-06-09 MED ORDER — ONDANSETRON HCL 4 MG/2ML IJ SOLN
INTRAMUSCULAR | Status: DC | PRN
  Administered 2023-06-09: 4 mg via INTRAVENOUS

## 2023-06-09 MED ORDER — LIDOCAINE 2% (20 MG/ML) 5 ML SYRINGE
INTRAMUSCULAR | Status: DC | PRN
  Administered 2023-06-09: 40 mg via INTRAVENOUS

## 2023-06-09 MED ORDER — GLYCOPYRROLATE PF 0.2 MG/ML IJ SOSY
PREFILLED_SYRINGE | INTRAMUSCULAR | Status: DC | PRN
  Administered 2023-06-09: .1 mg via INTRAVENOUS

## 2023-06-09 MED ORDER — PROPOFOL 10 MG/ML IV BOLUS
INTRAVENOUS | Status: AC
  Filled 2023-06-09: qty 20

## 2023-06-09 MED ORDER — SODIUM CHLORIDE 0.9 % IV SOLN: INTRAVENOUS | Status: DC | PRN

## 2023-06-09 MED ORDER — FLEET ENEMA RE ENEM
1.0000 | ENEMA | Freq: Once | RECTAL | Status: AC
  Administered 2023-06-09: 1 via RECTAL

## 2023-06-09 MED ORDER — SODIUM CHLORIDE 0.9% FLUSH: 3.0000 mL | INTRAVENOUS | Status: DC | PRN

## 2023-06-09 SURGICAL SUPPLY — 23 items
BLOCK BITE 60FR ADLT L/F BLUE (MISCELLANEOUS) ×2 IMPLANT
ELECT REM PT RETURN 9FT ADLT (ELECTROSURGICAL)
ELECTRODE REM PT RTRN 9FT ADLT (ELECTROSURGICAL) IMPLANT
FLOOR PAD 36X40 (MISCELLANEOUS) ×2
FORCEP RJ3 GP 1.8X160 W-NEEDLE (CUTTING FORCEPS) IMPLANT
FORCEPS BIOP RAD 4 LRG CAP 4 (CUTTING FORCEPS) IMPLANT
FORCEPS BIOP RJ4 240 W/NDL (CUTTING FORCEPS)
FORCEPS BXJMBJMB 240X2.8X (CUTTING FORCEPS) IMPLANT
INJECTOR/SNARE I SNARE (MISCELLANEOUS) IMPLANT
LUBRICANT JELLY 4.5OZ STERILE (MISCELLANEOUS) IMPLANT
MANIFOLD NEPTUNE II (INSTRUMENTS) IMPLANT
NDL SCLEROTHERAPY 25GX240 (NEEDLE) IMPLANT
NEEDLE SCLEROTHERAPY 25GX240 (NEEDLE)
PAD FLOOR 36X40 (MISCELLANEOUS) ×2 IMPLANT
PROBE APC STR FIRE (PROBE) IMPLANT
PROBE INJECTION GOLD 7FR (MISCELLANEOUS) IMPLANT
SNARE ROTATE MED OVAL 20MM (MISCELLANEOUS) IMPLANT
SNARE SHORT THROW 13M SML OVAL (MISCELLANEOUS) IMPLANT
SYR 50ML LL SCALE MARK (SYRINGE) IMPLANT
TRAP SPECIMEN MUCOUS 40CC (MISCELLANEOUS) IMPLANT
TUBING ENDO SMARTCAP PENTAX (MISCELLANEOUS) ×4 IMPLANT
TUBING IRRIGATION ENDOGATOR (MISCELLANEOUS) ×2 IMPLANT
WATER STERILE IRR 1000ML POUR (IV SOLUTION) IMPLANT

## 2023-06-09 NOTE — Interval H&P Note (Signed)
 History and Physical Interval Note: 62/male with Iron deficiency anemia, dysphagia for an EGD with possible balloon dilation and colonoscopy with propofol .  06/09/2023 8:48 AM  Christopher Yu  has presented today forEGD with possible balloon dilation and colonoscopy with propofol , with the diagnosis of Iron deficiency anemia/dysphagia.  The various methods of treatment have been discussed with the patient and family. After consideration of risks, benefits and other options for treatment, the patient has consented to  Procedure(s): ESOPHAGOGASTRODUODENOSCOPY (EGD) WITH PROPOFOL  (N/A) COLONOSCOPY WITH PROPOFOL  (N/A) as a surgical intervention.  The patient's history has been reviewed, patient examined, no change in status, stable for surgery.  I have reviewed the patient's chart and labs.  Questions were answered to the patient's satisfaction.     Estelita Manas

## 2023-06-09 NOTE — Op Note (Signed)
 [facility] Patient Name: Christopher Yu Procedure Date: 06/09/2023 MRN: [id number] Attending MD: Estelita Manas , MD, [id number] Date of Birth: October 05, 1960 CSN: [id number] Age: 63 Admit Type: Outpatient Procedure:                Colonoscopy Indications:              Last colonoscopy: 2015, Iron deficiency anemia Providers:                Estelita Manas, MD, Hoy Penner, RN, Felice Sar,                            Technician Referring MD:             Trula Brim, MD Medicines:                Monitored Anesthesia Care Complications:            No immediate complications. Estimated Blood Loss:     Estimated blood loss was minimal. Procedure:                Pre-Anesthesia Assessment:                           - Prior to the procedure, a History and Physical                            was performed, and patient medications and                            allergies were reviewed. The patient's tolerance of                            previous anesthesia was also reviewed. The risks                            and benefits of the procedure and the sedation                            options and risks were discussed with the patient.                            All questions were answered, and informed consent                            was obtained. Prior Anticoagulants: The patient has                            taken no anticoagulant or antiplatelet agents                            except for aspirin . ASA Grade Assessment: III - A                            patient with severe systemic disease. After  reviewing the risks and benefits, the patient was                            deemed in satisfactory condition to undergo the                            procedure.                           - Prior to the procedure, a History and Physical                            was performed, and patient medications and                            allergies were reviewed.  The patient's tolerance of                            previous anesthesia was also reviewed. The risks                            and benefits of the procedure and the sedation                            options and risks were discussed with the patient.                            All questions were answered, and informed consent                            was obtained. Prior Anticoagulants: The patient has                            taken no anticoagulant or antiplatelet agents                            except for aspirin . ASA Grade Assessment: III - A                            patient with severe systemic disease. After                            reviewing the risks and benefits, the patient was                            deemed in satisfactory condition to undergo the                            procedure.                           After obtaining informed consent, the colonoscope  was passed under direct vision. Throughout the                            procedure, the patient's blood pressure, pulse, and                            oxygen saturations were monitored continuously. The                            [id number]-HQ190L ([id number]) Olympus colonoscope was                            introduced through the anus and advanced to the the                            terminal ileum. The colonoscopy was performed                            without difficulty. The patient tolerated the                            procedure well. The quality of the bowel                            preparation was fair. The terminal ileum, ileocecal                            valve, appendiceal orifice, and rectum were                            photographed. Scope In: 9:58:47 AM Scope Out: 10:13:35 AM Scope Withdrawal Time: 0 hours 6 minutes 44 seconds  Total Procedure Duration: 0 hours 14 minutes 48 seconds  Findings:      Hemorrhoids were found on perianal exam.      The terminal ileum  appeared normal.      Two sessile polyps were found in the transverse colon. The polyps were 5       to 7 mm in size. These polyps were removed with a cold snare. Resection       and retrieval were complete.      Multiple large-mouthed and medium-mouthed diverticula were found in the       sigmoid colon, descending colon, transverse colon, hepatic flexure and       ascending colon.      Non-bleeding internal hemorrhoids were found during retroflexion. Impression:               - Preparation of the colon was fair.                           - Hemorrhoids found on perianal exam.                           - The examined portion of the ileum was normal.                           -  Two 5 to 7 mm polyps in the transverse colon,                            removed with a cold snare. Resected and retrieved.                           - Diverticulosis in the sigmoid colon, in the                            descending colon, in the transverse colon, at the                            hepatic flexure and in the ascending colon.                           - Non-bleeding internal hemorrhoids. Moderate Sedation:      Patient did not receive moderate sedation for this procedure, but       instead received monitored anesthesia care. Recommendation:           - Patient has a contact number available for                            emergencies. The signs and symptoms of potential                            delayed complications were discussed with the                            patient. Return to normal activities tomorrow.                            Written discharge instructions were provided to the                            patient.                           - High fiber diet.                           - Continue present medications.                           - Await pathology results.                           - Repeat colonoscopy for surveillance based on                            pathology  results. Procedure Code(s):        --- Professional ---                           [phone], Colonoscopy, flexible; with removal of  tumor(s), polyp(s), or other lesion(s) by snare                            technique Diagnosis Code(s):        --- Professional ---                           K64.8, Other hemorrhoids                           D12.3, Benign neoplasm of transverse colon (hepatic                            flexure or splenic flexure)                           D50.9, Iron deficiency anemia, unspecified                           K57.30, Diverticulosis of large intestine without                            perforation or abscess without bleeding CPT copyright 2022 American [employer]. All rights reserved. The codes documented in this report are preliminary and upon coder review may  be revised to meet current compliance requirements. Estelita Manas, MD 06/09/2023 10:22:05 AM This report has been signed electronically. Number of Addenda: 0

## 2023-06-09 NOTE — Transfer of Care (Signed)
 Immediate Anesthesia Transfer of Care Note  Patient: Ashad Fawbush  Procedure(s) Performed: ESOPHAGOGASTRODUODENOSCOPY (EGD) WITH PROPOFOL  COLONOSCOPY WITH PROPOFOL  BIOPSY POLYPECTOMY HEMOSTASIS CLIP PLACEMENT  Patient Location: Endoscopy Unit  Anesthesia Type:MAC  Level of Consciousness: awake, alert , oriented, and patient cooperative  Airway & Oxygen Therapy: Patient Spontanous Breathing and Patient connected to face mask oxygen  Post-op Assessment: Report given to RN and Post -op Vital signs reviewed and stable  Post vital signs: Reviewed and stable  Last Vitals:  Vitals Value Taken Time  BP 92/67 06/09/23 1022  Temp    Pulse 88 06/09/23 1023  Resp 24 06/09/23 1023  SpO2 93 % 06/09/23 1023  Vitals shown include unfiled device data.  Last Pain:  Vitals:   06/09/23 0814  TempSrc: Tympanic  PainSc: 0-No pain         Complications: No notable events documented.

## 2023-06-09 NOTE — Op Note (Signed)
 [facility] Patient Name: Christopher Yu Procedure Date: 06/09/2023 MRN: [id number] Attending MD: Estelita Manas , MD, [id number] Date of Birth: 02/09/1961 CSN: [id number] Age: 63 Admit Type: Outpatient Procedure:                Upper GI endoscopy Indications:              Iron deficiency anemia, Dysphagia Providers:                Estelita Manas, MD, Hoy Penner, RN, Felice Sar,                            Technician Referring MD:             Trula Brim, MD Medicines:                Monitored Anesthesia Care Complications:            No immediate complications. Estimated blood loss:                            Minimal. Estimated Blood Loss:     Estimated blood loss was minimal. Procedure:                Pre-Anesthesia Assessment:                           - Prior to the procedure, a History and Physical                            was performed, and patient medications and                            allergies were reviewed. The patient's tolerance of                            previous anesthesia was also reviewed. The risks                            and benefits of the procedure and the sedation                            options and risks were discussed with the patient.                            All questions were answered, and informed consent                            was obtained. Prior Anticoagulants: The patient has                            taken no anticoagulant or antiplatelet agents                            except for aspirin . ASA Grade Assessment: III - A  patient with severe systemic disease. After                            reviewing the risks and benefits, the patient was                            deemed in satisfactory condition to undergo the                            procedure.                           After obtaining informed consent, the endoscope was                            passed under direct vision. Throughout  the                            procedure, the patient's blood pressure, pulse, and                            oxygen saturations were monitored continuously. The                            [id number]-[id number] ([id number]) Olympus endoscope was introduced                            through the mouth, and advanced to the second part                            of duodenum. The upper GI endoscopy was                            accomplished without difficulty. The patient                            tolerated the procedure well. Scope In: Scope Out: Findings:      No endoscopic abnormality was evident in the esophagus to explain the       patient's complaint of dysphagia. Biopsies were obtained from the       proximal and distal esophagus with cold forceps for histology of       suspected eosinophilic esophagitis.      The Z-line was regular and was found 40 cm from the incisors.      Patchy mildly erythematous mucosa without bleeding was found in the       gastric body and in the gastric antrum. Biopsies were taken with a cold       forceps for Helicobacter pylori testing.      The cardia and gastric fundus were normal on retroflexion.      A single 5 mm sessile polyp with no stigmata of recent bleeding was       found on the lesser curvature of the stomach. The polyp was removed with       a cold biopsy forceps. Resection and retrieval were complete. To stop  active bleeding, three hemostatic clips were successfully placed (MR       conditional). Clip manufacturer: [employer]. There was no       bleeding at the end of the procedure.      The examined duodenum was normal. Biopsies for histology were taken with       a cold forceps for evaluation of celiac disease. Impression:               - No endoscopic esophageal abnormality to explain                            patient's dysphagia.                           - Z-line regular, 40 cm from the incisors.                           -  Erythematous mucosa in the gastric body and                            antrum. Biopsied.                           - A single gastric polyp. Resected and retrieved.                            Clips (MR conditional) were placed. Clip                            manufacturer: [employer].                           - Normal examined duodenum. Biopsied.                           - Biopsies were taken with a cold forceps for                            evaluation of eosinophilic esophagitis. Moderate Sedation:      Patient did not receive moderate sedation for this procedure, but       instead received monitored anesthesia care. Recommendation:           - Patient has a contact number available for                            emergencies. The signs and symptoms of potential                            delayed complications were discussed with the                            patient. Return to normal activities tomorrow.                            Written discharge instructions were provided to the  patient.                           - Resume regular diet.                           - Continue present medications.                           - Await pathology results.                           - Perform a colonoscopy today. Procedure Code(s):        --- Professional ---                           [phone], 59, Esophagogastroduodenoscopy, flexible,                            transoral; with control of bleeding, any method                           43239, Esophagogastroduodenoscopy, flexible,                            transoral; with biopsy, single or multiple Diagnosis Code(s):        --- Professional ---                           R13.10, Dysphagia, unspecified                           K31.89, Other diseases of stomach and duodenum                           K31.7, Polyp of stomach and duodenum                           D50.9, Iron deficiency anemia, unspecified CPT  copyright 2022 American Medical Association. All rights reserved. The codes documented in this report are preliminary and upon coder review may  be revised to meet current compliance requirements. Estelita Manas, MD 06/09/2023 10:19:02 AM This report has been signed electronically. Number of Addenda: 0

## 2023-06-09 NOTE — Anesthesia Postprocedure Evaluation (Signed)
 Anesthesia Post Note  Patient: Christopher Yu  Procedure(s) Performed: ESOPHAGOGASTRODUODENOSCOPY (EGD) WITH PROPOFOL  COLONOSCOPY WITH PROPOFOL  BIOPSY POLYPECTOMY HEMOSTASIS CLIP PLACEMENT     Patient location during evaluation: PACU Anesthesia Type: MAC Level of consciousness: awake and alert Pain management: pain level controlled Vital Signs Assessment: post-procedure vital signs reviewed and stable Respiratory status: spontaneous breathing, nonlabored ventilation, respiratory function stable and patient connected to nasal cannula oxygen Cardiovascular status: stable and blood pressure returned to baseline Postop Assessment: no apparent nausea or vomiting Anesthetic complications: no   No notable events documented.  Last Vitals:  Vitals:   06/09/23 1030 06/09/23 1040  BP: 108/74 109/77  Pulse: 78 78  Resp: (!) 23 16  Temp:    SpO2: 94% 92%    Last Pain:  Vitals:   06/09/23 1030  TempSrc:   PainSc: 0-No pain                 Thom JONELLE Peoples

## 2023-06-09 NOTE — Anesthesia Preprocedure Evaluation (Signed)
Anesthesia Evaluation  Patient identified by MRN, date of birth, ID band Patient awake    Reviewed: Allergy & Precautions, NPO status , Patient's Chart, lab work & pertinent test results, reviewed documented beta blocker date and time   Airway Mallampati: III  TM Distance: >3 FB Neck ROM: Full    Dental  (+) Teeth Intact, Dental Advisory Given   Pulmonary sleep apnea , former smoker   Pulmonary exam normal breath sounds clear to auscultation       Cardiovascular hypertension, Pt. on medications and Pt. on home beta blockers + CAD  Normal cardiovascular exam Rhythm:Regular Rate:Normal  Echo 07/29/22: 1. Left ventricular ejection fraction, by estimation, is 40 to 45%. The  left ventricle has mildly decreased function. The left ventricle  demonstrates global hypokinesis. Left ventricular diastolic parameters are  consistent with Grade I diastolic dysfunction (impaired relaxation).   2. Right ventricular systolic function is normal. The right ventricular  size is normal.   3. The mitral valve is normal in structure. No evidence of mitral valve  regurgitation.   4. Suspect bicuspid aortic valve. There is mild low flow-low gradient  aortic stenosis (gradients are underestimated due to low stroke volume).  The aortic valve is bicuspid. There is mild calcification of the aortic  valve. There is moderate thickening of  the aortic valve. Aortic valve regurgitation is not visualized. Mild aortic valve stenosis. Aortic valve mean gradient measures 7.0 mmHg. Aortic valve Vmax measures 1.78 m/s.   5. Aortic dilatation noted. There is moderate dilatation of the aortic  root, measuring 45 mm.     Neuro/Psych  Headaches PSYCHIATRIC DISORDERS Anxiety Depression    HYDROCEPHALUS  cerebellar CVA, c/b recent pseudomeningocele repair and Hx AVM resection December 2023  S/p vp shunt CVA, Residual Symptoms    GI/Hepatic negative GI ROS, Neg liver  ROS,,,  Endo/Other  negative endocrine ROS    Renal/GU negative Renal ROS     Musculoskeletal  (+) Arthritis , Rheumatoid disorders,    Abdominal   Peds  Hematology  (+) Blood dyscrasia, anemia   Anesthesia Other Findings   Reproductive/Obstetrics                              Anesthesia Physical Anesthesia Plan  ASA: 3  Anesthesia Plan: MAC   Post-op Pain Management:    Induction: Intravenous  PONV Risk Score and Plan: Propofol infusion and Treatment may vary due to age or medical condition  Airway Management Planned: Natural Airway  Additional Equipment:   Intra-op Plan:   Post-operative Plan:   Informed Consent: I have reviewed the patients History and Physical, chart, labs and discussed the procedure including the risks, benefits and alternatives for the proposed anesthesia with the patient or authorized representative who has indicated his/her understanding and acceptance.     Dental advisory given  Plan Discussed with: CRNA  Anesthesia Plan Comments:          Anesthesia Quick Evaluation

## 2023-06-09 NOTE — Discharge Instructions (Signed)

## 2023-06-10 ENCOUNTER — Ambulatory Visit: Payer: BC Managed Care – PPO | Admitting: Psychology

## 2023-06-10 DIAGNOSIS — F321 Major depressive disorder, single episode, moderate: Secondary | ICD-10-CM

## 2023-06-10 NOTE — Progress Notes (Signed)
 [facility] Counselor Initial Adult Exam  Name: Christopher Yu Date: 06/10/2023 MRN: [id number] DOB: [birth year] PCP: Christopher Trula SQUIBB, MD    Guardian/Payee:  N/A    Paperwork requested: No   Reason for Visit [name] Problem: Adjustment to serious medical illness  Mental Status Exam: Appearance:   Casual     Behavior:  Appropriate  Motor:  Shuffling Gait  Speech/Language:   Slurred  Affect:  Depressed  Mood:  depressed  Thought process:  normal  Thought content:    WNL  Sensory/Perceptual disturbances:    WNL  Orientation:  oriented to person, place, and situation  Attention:  Good  Concentration:  Good  Memory:  WNL  Fund of knowledge:   Good  Insight:    Good  Judgment:   unknown  Impulse Control:  unknown    Reported Symptoms:  Sadness, helplessness and hopelessness  Risk Assessment: Danger to Self:  No Self-injurious Behavior: No Danger to Others: No Duty to Warn:no Physical Aggression / Violence:No  Access to Firearms a concern:  Unknown Gang Involvement:No  Patient / guardian was educated about steps to take if suicide or homicide risk level increases between visits: n/a While future psychiatric events cannot be accurately predicted, the patient does not currently require acute inpatient psychiatric care and does not currently meet [place]  involuntary commitment criteria.  Substance Abuse History: Current substance abuse: No      Past Psychiatric History:   No previous psychological problems have been observed Outpatient Providers:N/A History of Psych Hospitalization: No  Psychological Testing:  none    Abuse History:  Victim of:   N/A   Report needed: No. Victim of Neglect:No. Perpetrator of  N/A   Witness / Exposure to Domestic Violence: No   Protective Services Involvement: No  Witness to [employer] Violence:  No   Family History:  Family History  Problem Relation Age of Onset   Other Father        MVA   Lupus Sister  Living situation: the patient lives with their family  Sexual Orientation: Straight  Relationship Status: married  Name of spouse Christopher Yu If a parent, number of children / ages:Two girls, ages 43 and 25  Support Systems: spouse  Surveyor, Quantity Stress:  Yes   Income/Employment/Disability: Software Engineer:  unknown  Educational History: Education:  unknown  Oncologist: unknown  Any cultural differences that may affect / interfere with treatment:  not applicable   Recreation/Hobbies: golfing  Stressors: Financial difficulties   Health problems    Strengths: Family  Barriers:  severe physical limitations that compromises activity   Legal History: Pending legal issue / charges: The patient has no significant history of legal issues. History of legal issue / charges:  N/A  Medical History/Surgical History: reviewed Past Medical History:  Diagnosis Date   Anxiety    Arthritis, rheumatoid (HCC)    dx 1988   CAD (coronary artery disease), native coronary artery    Mild LAD disease with calcification noted at cath 12//27/16    Cardiomyopathy (HCC)    Cardiomyopathy (HCC)    Chronic systolic heart failure (HCC) 05/01/2015   Depression    Deviated nasal septum 06/25/2011   Headache    High cholesterol    Hyperlipidemia    Impingement syndrome of left shoulder 12/22/2017   Macular degeneration    Nausea and vomiting  08/28/2022   Rheumatoid aortitis    Sleep apnea    does not use cpap - UNABLE TO TOLERATE MASK   Sleep apnea in adult    deviated septum repaired, most recent sleep study was negative   Status post total right knee replacement 06/01/2015    Past Surgical History:  Procedure Laterality Date   ANKLE FUSION Right 05/05/2012   related to his arthritis   ANKLE FUSION Left 05/25/2013   related to his arthritis   ANKLE SURGERY Bilateral    APPLICATION OF CRANIAL NAVIGATION N/A 04/11/2022   Procedure: APPLICATION OF CRANIAL NAVIGATION;  Surgeon: Lanis Pupa, MD;  Location: [facility] OR;  Service: Neurosurgery;  Laterality: N/A;   APPLICATION OF CRANIAL NAVIGATION N/A 08/08/2022   Procedure: APPLICATION OF CRANIAL NAVIGATION;  Surgeon: Lanis Pupa, MD;  Location: [facility] OR;  Service: Neurosurgery;  Laterality: N/A;   BIOPSY  06/09/2023   Procedure: BIOPSY;  Surgeon: Saintclair Jasper, MD;  Location: [facility];  Service: Gastroenterology;;   CARDIAC CATHETERIZATION N/A 05/01/2015   Procedure: Left Heart Cath and Coronary Angiography;  Surgeon: Victory LELON Sharps, MD;  Location: [facility] INVASIVE CV LAB;  Service: Cardiovascular;  Laterality: N/A;   COLONOSCOPY WITH PROPOFOL  N/A 06/09/2023   Procedure: COLONOSCOPY WITH PROPOFOL ;  Surgeon: Saintclair Jasper, MD;  Location: [facility];  Service: Gastroenterology;  Laterality: N/A;   CRANIOTOMY N/A 04/11/2022   Procedure: STEREOTACTIC SUBOCCIPITAL CRANIOTOMY FOR RESECTION OF ARTERIO-VENOUS MALFORMATION;  Surgeon: Lanis Pupa, MD;  Location: [facility];  Service: Neurosurgery;  Laterality: N/A;   ESOPHAGOGASTRODUODENOSCOPY (EGD) WITH PROPOFOL  N/A 06/09/2023   Procedure: ESOPHAGOGASTRODUODENOSCOPY (EGD) WITH PROPOFOL ;  Surgeon: Saintclair Jasper, MD;  Location: [facility];  Service: Gastroenterology;  Laterality: N/A;   FRACTURE SURGERY     left femur fracture x 3   HEMOSTASIS CLIP PLACEMENT  06/09/2023   Procedure: HEMOSTASIS CLIP PLACEMENT;  Surgeon: Saintclair Jasper, MD;   Location: [facility];  Service: Gastroenterology;;   IR ANGIO EXTERNAL CAROTID SEL EXT CAROTID BILAT MOD SED  01/28/2022   IR ANGIO INTRA EXTRACRAN SEL INTERNAL CAROTID BILAT MOD SED  01/28/2022   IR ANGIO INTRA  EXTRACRAN SEL INTERNAL CAROTID BILAT MOD SED  04/16/2022   IR ANGIO VERTEBRAL SEL VERTEBRAL BILAT MOD SED  01/28/2022   IR ANGIO VERTEBRAL SEL VERTEBRAL BILAT MOD SED  04/16/2022   KNEE ARTHROSCOPY     right x 4   LAPAROSCOPIC REVISION VENTRICULAR-PERITONEAL (V-P) SHUNT N/A 08/08/2022   Procedure: LAPAROSCOPIC VENTRICULAR-PERITONEAL (V-P) SHUNT;  Surgeon: Vanderbilt Ned, MD;  Location: [facility];  Service: General;  Laterality: N/A;   LUMBAR LAMINECTOMY/DECOMPRESSION MICRODISCECTOMY N/A 07/18/2022   Procedure: Repair of Pseudomeningiocele posterior;  Surgeon: Lanis Pupa, MD;  Location: [facility];  Service: Neurosurgery;  Laterality: N/A;   NASAL SEPTOPLASTY W/ TURBINOPLASTY  06/25/2011   Procedure: NASAL SEPTOPLASTY WITH TURBINATE REDUCTION;  Surgeon: Alm Bouche, MD;  Location: [facility] OR;  Service: ENT;  Laterality: Bilateral;   PLACEMENT OF LUMBAR DRAIN N/A 07/18/2022   Procedure: PLACEMENT OF LUMBAR DRAIN;  Surgeon: Lanis Pupa, MD;  Location: [facility];  Service: Neurosurgery;  Laterality: N/A;   POLYPECTOMY  06/09/2023   Procedure: POLYPECTOMY;  Surgeon: Saintclair Jasper, MD;  Location: [facility];  Service: Gastroenterology;;   TONSILLECTOMY     TOTAL KNEE ARTHROPLASTY Right 06/01/2015   Procedure: RIGHT TOTAL KNEE ARTHROPLASTY;  Surgeon: Lonni CINDERELLA Poli, MD;  Location: [facility];  Service: Orthopedics;  Laterality: Right;  Block+general   VENTRICULOPERITONEAL SHUNT Right 08/08/2022   Procedure: LAP ASSITED SHUNT INSERTION VENTRICULAR-PERITONEAL;  Surgeon: Lanis Pupa, MD;  Location: [facility];  Service: Neurosurgery;  Laterality: Right;    Medications: Current Outpatient Medications  Medication Sig Dispense Refill   acetaminophen  (TYLENOL ) 325 MG tablet Take 2 tablets  (650 mg total) by mouth 3 (three) times daily.     alendronate (FOSAMAX) 10 MG tablet Take 10 mg by mouth daily before breakfast. Take with a full glass of water  on an empty stomach.     artificial tears (LACRILUBE) OINT ophthalmic ointment Place into both eyes 2 (two) times daily.     aspirin  EC 81 MG tablet Take 1 tablet (81 mg total) by mouth daily. Swallow whole. 30 tablet 12   calcium  carbonate (TUMS - DOSED IN MG ELEMENTAL CALCIUM ) 500 MG chewable tablet Chew 1 tablet (200 mg of elemental calcium  total) by mouth daily with supper.     Cholecalciferol (VITAMIN D3) 50 MCG (2000 UT) TABS Take 2,000 Units by mouth daily.     famotidine (PEPCID) 40 MG tablet 1 tablet Orally Once a day for 30 days     folic acid  (FOLVITE ) 1 MG tablet Take 1 mg by mouth daily.     methotrexate  (RHEUMATREX) 2.5 MG tablet Take 20 mg by mouth once a week. Takes 8 tablets(20mg ) every Saturday.     Caution:Chemotherapy. Protect from light.     metoprolol  succinate (TOPROL -XL) 25 MG 24 hr tablet TAKE 1 TABLET DAILY 90 tablet 3   Multiple Vitamins-Minerals (PRESERVISION AREDS 2 PO) Take 2 capsules by mouth daily.     predniSONE  (STERAPRED UNI-PAK 21 TAB) 5 MG (21) TBPK tablet Take 5 mg by mouth every morning.     RITUXAN  500 MG/50ML injection See admin instructions. Every 6 months 2 dose infusion Ruxience      rosuvastatin  (CRESTOR ) 20 MG tablet TAKE 1 TABLET DAILY (DOSE INCREASE) 90 tablet 2   sertraline  (ZOLOFT ) 20 MG/ML concentrated solution Take 25 mg by mouth daily.     spironolactone  (ALDACTONE ) 25 MG tablet 12.5 mg. 1/2 tab daily     sulfaSALAzine  (AZULFIDINE ) 500 MG tablet Take 1,000 mg by mouth 2 (two) times daily.  traMADol  (ULTRAM ) 50 MG tablet Take 1 tablet (50 mg total) by mouth every 12 (twelve) hours as needed for moderate pain. (Patient taking differently: Take 50 mg by mouth as needed for moderate pain (pain score 4-6).) 60 tablet 2   traZODone  (DESYREL ) 50 MG tablet TAKE 1.5 TABLETS (75 MG TOTAL) BY  MOUTH AT BEDTIME AS NEEDED. FOR SLEEP 135 tablet 5   White Petrolatum -Mineral Oil (GENTEAL TEARS NIGHT-TIME OP) Apply to eye at bedtime.     No current facility-administered medications for this visit.    Allergies  Allergen Reactions   Hydroxychloroquine      Other Reaction(s): cardiolmyopathy    Diagnoses:  Major depression  Plan of Care: Outpatient psychotherapy and medication management as needed. Initial Note: (With wife Christopher Yu). She says that she has hoped Christopher Yu would get into therapy. He has Ataxic Tremor. He told Dr. Evonnie that he would like medication to just sit and do nothing. He says he always had golf to rey on and now cannot play. In December, he went n for a surgery to remove an avm from brain. Was told it was a routine surgery and was told that he needs to get it out. Was told it would be a short recovery and he would be back to work. 1 1/2 hour surgery turned into 6 hour surgery and he had complications. He had extra cerebral fluid and Dr. Glenwood it would absorb and just wait. He kept declining and by March, they went in to try and drain the extra fluid. He was in hospital for a week. Wife noticed that during the hospital stay, his speech declined and he was clearly weak. Nurses were trying to discharge him and she protested, but they sent him home anyway. He declined, and could not sit, stand, talk or walk and she ended up calling ambulance. In ER, she ws told he had a stroke. He was in ER 24 hours before getting a room in Neuro area. Head of stroke team came in and said it was not a stroke, but complications from surgery. She was blamed by medical team for not speaking up before being initially discharged. He ended up in a rehab for 2 weeks and told them he needed another surgery. Was told he had hydrocephalus and need a shunt to drain liquid off his brain. She felt a lot of pressure from team that he needed surgery. She wanted a second opinion and had rouble getting it. Spoke to  another Dr. and they decided to get the third surgery. He was in OT,PT and Speech Therapy. During therapy, he started having a tremor. They ended up being referred to Dr. Evonnie, who has now told him that treating his tremor is tricky and will take time.  [place] is no longer in OT,PT speech therapy. He is supposed to be doing exercises on his own, but has not been emotionally able to ge himself to do it. He has persistent headaches as well. Just had a CAT Scan and awaiting the results. His headaches are worse and they are concerned. He also has rhumatory arthritis and it is flaring up because he had to stop meds. He has cardiomyopathy, macular degeneration on top of all of these other conditions.  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 He is still employed with insurance, but they fear he may be let go. He has been approved for disability and are working to get social security disability. She works part time. They have 2 daughters,  one with diabetes and arthritis. She is heading to [employer] this Fall and wants to work in research scientist (physical sciences). Other daughter is 61 and is our introvert with lots of anxiety. She is in therapy and has mental health issues that is being treated. She will be a holiday representative at the middle college at [employer].  They are told that medicine will not control his condition, but they will have to try to retrain his brain. Suggested that they work to get back into OT and PT. She has a hard time motivating him. He admits that he keeps things inside. She says he is not a good comunicator. He will not share things like if he is in pain or hurts himself.   Tx. Plan/Goals: Patient reports depressive symptoms secondary to the medical trauma he has suffered and the debilitating physical results. He is seeking therapy to resolve his feelings of helplessness and hopelessness. Also want assistance in improving his drive/motivation in daily activity. Will engage in therapy that utilizes behavioral approaches to  mitigate symptoms along with insight oriented counseling. Will also incorporate some conjoint sessions with spouse as needed, given her integral involvement with his care. Patient agrees with plan for treatment. Goal Date is 12-25    Patient agrees to be seen in the provider's office. Session Note:  Patient states he did not attend father in laws funeral because he had an infusion the day before and was not feeling well. Had colonoscopy yesterday and is a little dizzy today. He says he had a good week. He heard from his golf buddy who said he enjoys hitting balls on driving range, which is how they became friends. Christopher Yu enjoyed talking to him. He feels that the infusions are helpful in that they help reduce swelling in his joints. Down side is the infusion depletes his B cells.  He has a sleep study next week. Not sure he can tolerate the Cpap machine if needed. He says that there is not anything that gives him enjoyment, because he is not any longer successful at the thinks he was successful at in the past.                                                    CONI ALM KERNS, PhD 4:10p-5:00 50 minutes

## 2023-06-15 ENCOUNTER — Ambulatory Visit (INDEPENDENT_AMBULATORY_CARE_PROVIDER_SITE_OTHER): Payer: BC Managed Care – PPO | Admitting: Neurology

## 2023-06-15 DIAGNOSIS — G4737 Central sleep apnea in conditions classified elsewhere: Secondary | ICD-10-CM

## 2023-06-15 DIAGNOSIS — I639 Cerebral infarction, unspecified: Secondary | ICD-10-CM

## 2023-06-15 DIAGNOSIS — Q282 Arteriovenous malformation of cerebral vessels: Secondary | ICD-10-CM

## 2023-06-15 DIAGNOSIS — I5022 Chronic systolic (congestive) heart failure: Secondary | ICD-10-CM

## 2023-06-15 DIAGNOSIS — G4733 Obstructive sleep apnea (adult) (pediatric): Secondary | ICD-10-CM | POA: Diagnosis not present

## 2023-06-15 LAB — SURGICAL PATHOLOGY

## 2023-06-17 ENCOUNTER — Encounter: Payer: Self-pay | Admitting: Physical Medicine and Rehabilitation

## 2023-06-17 ENCOUNTER — Ambulatory Visit: Payer: BC Managed Care – PPO | Admitting: Psychology

## 2023-06-17 DIAGNOSIS — R1310 Dysphagia, unspecified: Secondary | ICD-10-CM

## 2023-06-17 DIAGNOSIS — I639 Cerebral infarction, unspecified: Secondary | ICD-10-CM

## 2023-06-22 ENCOUNTER — Encounter: Payer: Self-pay | Admitting: Neurology

## 2023-06-22 DIAGNOSIS — M0579 Rheumatoid arthritis with rheumatoid factor of multiple sites without organ or systems involvement: Secondary | ICD-10-CM | POA: Diagnosis not present

## 2023-06-22 NOTE — Addendum Note (Signed)
Addended by: Melvyn Novas on: 06/22/2023 12:41 PM   Modules accepted: Orders

## 2023-06-22 NOTE — Procedures (Signed)
[facility] PAP TITRATION INTERPRETATION REPORT   STUDY DATE: 06/15/2023      Yu NAME:  Christopher Yu         DATE OF BIRTH:  17-Jul-1960  Yu ID:  [id number]    TYPE OF STUDY:  CPAP  READING PHYSICIAN: Melvyn Novas, MD Referral by Rehab  Dr Shearon Stalls, Behaviour health Dr.Gutterman, and neurosurgeon Dr Conchita Paris SCORING TECHNICIAN: Margaretann Loveless, RPSGT   HISTORY: Christopher Yu is a 63 year-old Male Yu with 3 AVM brain surgeries, NPH history, Pseudomeningocele, VP Shunt  prior to stroke, Ataxic tremor ( Dr Tat) and chronic insomnia. He underwent a baseline sleep study resulting in an AHI of  68/h.  50% central apneas. He endorsed Christopher Epworth Sleepiness Scale at 3 out of 24 (scores above or equal to 10 are suggestive of hypersomnolence).  DESCRIPTION: A sleep technologist was in attendance for Christopher duration of Christopher recording.  Data collection, scoring, video monitoring, and reporting were performed in compliance with Christopher AASM Manual for Christopher Scoring of Sleep and Associated Events; (Hypopnea is scored based on Christopher criteria listed in Section VIII D. 1b in Christopher AASM Manual V2.6 or Hypopnea is scored based on Christopher criteria listed in Section VIII D. 1a in Christopher AASM Manual V2.6  ADDITIONAL INFORMATION:  Height: 176.0 in Weight: 74 lb (BMI 1) Neck Size: 15.5 in   MEDICATIONS: Tylenol, Fosamax, Lacrilube, Tums, Vitamin D3, Pepcid, Folvite, Rheumatrex, Toprol-XL, Multivitamin, Prednisone, Rituxan, Crestor, Zoloft, Aldactone, Azulfidine, Tramadol, Trazodone, Gentle Tears Night-Time drops.  SLEEP CONTINUITY AND SLEEP ARCHITECTURE:  Lights off was at 22:18: and lights on 05:13: (6.9 hours in bed).  Christopher titration started under an EVORA FFM in size S/M . initiated at 5 cm water CPAP pressure ,  and titrated up to 17 cm water CPAP with a residual AHI of 11/h, then switched to BiPAP: Here starting at 19/15 and finally at 20/ 16 cm water, Christopher AHI was 0/h  but Christopher sleep efficiency was not as good.  There was REM sleep seen at Christopher last explored pressure.   Total sleep time was 357.0 minutes (0.0% supine; 100.0% lateral;  0.0% prone, with a normal sleep efficiency at 86.0%.  Sleep latency was 25.5 minutes.   Of Christopher total sleep time, Christopher percentage of stage N1 sleep was 1.4%, stage N2 sleep was 48.7%, stage N3 sleep was 38.7%, and REM sleep was 11.2%. There were 2 Stage R periods observed on this study night, 10 awakenings (i.e. transitions to Stage W from any sleep stage), and 33.0 total stage transitions. Wake after sleep onset (WASO) time accounted for 32 minutes.   RESPIRATORY MONITORING:    Based on AASM criteria (using a 3% oxygen desaturation and /or arousal rule for scoring hypopneas), there were 8 apneas (2 obstructive; 6 central; 0 mixed), and 87 hypopneas.  Apnea index was 1.3. Hypopnea index was 14.6.  Christopher AHI apnea-hypopnea index was 16.0/h overall (0.0 supine, 0.0 non-supine; 0.0 REM, 0.0 supine REM). There were 0 respiratory effort-related arousals (RERAs  OXIMETRY: Total sleep time spent at, or below 88% was 0.1 minutes, or 0.0% of total sleep time. Snoring was classified as absent. Respiratory events were associated with oxyhemoglobin desaturations (nadir during sleep 89% from a mean of 94%). CARDIAC: Christopher EKG showed  NSR with a mean heart rate during sleep at 66 bpm.  Christopher maximum heart rate during sleep was 86 bpm. Christopher maximum heart rate during recording was 87.   BODY POSITION:  Duration of total sleep and percent of total sleep in their respective position is as follows: supine 00 minutes (0.0%), non-supine 357.0 minutes (100.0%); right 00 minutes (0.0%), left 357 minutes (100.0%), and prone 00 minutes (0.0%). Total supine REM sleep time was 00 minutes (0.0% of total REM sleep). LIMB MOVEMENTS: There were 0 periodic limb movements of sleep (0.0/h), of which 0 (0.0/h) were associated with an arousal. AROUSAL: There were 18 arousals in  total, for an arousal index of 3.0 arousals/hour.  Of these, 3 were identified as respiratory-related arousals (0.5 /h), 0 were PLM-related arousals (0.0 /h), and 21 were non-specific arousals (3.5 /h)    IMPRESSION:   1. BILEVEL improved Christopher underlying sever4e Apnea at Christopher pressure of N/A cmH2O. Hypoxemia was not seen with therapy.   2. Total sleep time was within normal limits.  3. No significant periodic limb movements (PLMs) observed.           RECOMMENDATIONS: A BIPAP machine with a BiPAP  setting of 20/ 16 cm water, Christopher AHI was 0/h but Christopher sleep efficiency was not as good.  There was REM sleep seen at Christopher last explored pressure.  Heated humidification will be included. Christopher Yu will remain on prn trazodone.  Evora mask FFM in s/m size.    Melvyn Novas, MD             [facility]  CPAP/BiPAP Report    General Information  Name: Christopher Yu, Christopher Yu BMI: 1 Physician: , Melvyn Novas, MD    ID: [id number] Height: 176 in Technician: Margaretann Loveless  Sex: Male Weight: 74 lb Record: [id number]  Age: 4 [07/04/1960] Date: 06/15/2023 Scorer: Margaretann Loveless   Recommended Settings IPAP: N/A cmH20 EPAP: N/A cmH2O AHI: N/A AHI (4%): N/A   Pressure IPAP/EPAP 00 05 07 08 10 11 12 13    O2 Vol 0.0 0.0 0.0 0.0 0.0 0.0 0.0 0.0  Time TRT 0.30m 31.26m 6.17m 16.73m 35.76m 30.97m 75.61m 13.81m   TST 0.86m 4.49m 6.88m 16.56m 35.47m 30.20m 75.61m 13.8m  Sleep Stage % Wake 0.0 18.2 0.0 0.0 0.0 0.0 0.0 0.0   % REM 0.0 0.0 0.0 0.0 0.0 0.0 0.0 0.0   % N1 0.0 55.6 0.0 0.0 0.0 0.0 0.0 0.0   % N2 0.0 44.4 100.0 42.4 0.0 0.0 15.3 100.0   % N3 0.0 0.0 0.0 57.6 100.0 100.0 84.7 0.0  Respiratory Total Events 0 8 10 10 3 9 7 11    Obs. Apn. 0 2 0 0 0 0 0 0   Mixed Apn. 0 0 0 0 0 0 0 0   Cen. Apn. 0 2 2 1  0 0 0 0   Hypopneas 0 4 8 9 3 9 7 11    AHI 0.00 106.67 100.00 36.36 5.14 18.00 5.60 50.77   Supine AHI 0.00 0.00 0.00 0.00 0.00 0.00 0.00 0.00   Prone AHI 0.00  0.00 0.00 0.00 0.00 0.00 0.00 0.00   Side AHI 0.00 106.67 100.00 36.36 5.14 18.00 5.60 50.77  Respiratory (4%) Hypopneas (4%) 0.00 4.00 7.00 8.00 3.00 9.00 7.00 11.00   AHI (4%) 0.00 106.67 90.00 32.73 5.14 18.00 5.60 50.77   Supine AHI (4%) 0.00 0.00 0.00 0.00 0.00 0.00 0.00 0.00   Prone AHI (4%) 0.00 0.00 0.00 0.00 0.00 0.00 0.00 0.00   Side AHI (4%) 0.00 106.67 90.00 32.73 5.14 18.00 5.60 50.77  Desat Profile <= 90% 0.17m 0.57m 0.20m 0.52m 0.17m 0.74m 0.69m 0.40m   <=  80% 0.45m 0.18m 0.81m 0.29m 0.71m 0.71m 0.74m 0.70m   <= 70% 0.109m 0.62m 0.1m 0.22m 0.95m 0.55m 0.51m 0.28m   <= 60% 0.16m 0.48m 0.48m 0.58m 0.44m 0.66m 0.82m 0.66m  Arousal Index Apnea 0.0 13.3 10.0 0.0 0.0 0.0 0.0 0.0   Hypopnea 0.0 0.0 10.0 0.0 0.0 0.0 0.0 0.0   LM 0.0 0.0 0.0 0.0 0.0 0.0 0.0 0.0   Spontaneous 0.0 0.0 0.0 0.0 0.0 0.0 2.4 0.0   Pressure IPAP/EPAP 14 15 19  / 15 16 20  / 16 17   O2 Vol 0.0 0.0 0.0 0.0 0.0 0.0  Time TRT 9.46m 64.59m 25.22m 19.77m 78.16m 12.54m   TST 9.1m 64.32m 24.81m 19.66m 49.103m 11.90m  Sleep Stage % Wake 0.0 0.8 2.0 0.0 24.0 8.3   % REM 0.0 37.5 0.0 0.0 32.7 0.0   % N1 0.0 0.0 0.0 0.0 5.1 0.0   % N2 100.0 62.5 100.0 100.0 62.2 100.0   % N3 0.0 0.0 0.0 0.0 0.0 0.0  Respiratory Total Events 7 16 6 6  0 2   Obs. Apn. 0 0 0 0 0 0   Mixed Apn. 0 0 0 0 0 0   Cen. Apn. 0 0 1 0 0 0   Hypopneas 7 16 5 6  0 2   AHI 44.21 15.00 14.69 18.95 0.00 10.91   Supine AHI 0.00 0.00 0.00 0.00 0.00 0.00   Prone AHI 0.00 0.00 0.00 0.00 0.00 0.00   Side AHI 44.21 15.00 14.69 18.95 0.00 10.91  Respiratory (4%) Hypopneas (4%) 7.00 15.00 5.00 6.00 0.00 2.00   AHI (4%) 44.21 14.06 14.69 18.95 0.00 10.91   Supine AHI (4%) 0.00 0.00 0.00 0.00 0.00 0.00   Prone AHI (4%) 0.00 0.00 0.00 0.00 0.00 0.00   Side AHI (4%) 44.21 14.06 14.69 18.95 0.00 10.91  Desat Profile <= 90% 0.12m 0.65m 0.44m 0.55m 0.13m 0.4m   <= 80% 0.78m 0.34m 0.57m 0.73m 0.103m 0.75m   <= 70% 0.62m 0.30m 0.47m 0.71m 0.65m 0.54m   <= 60% 0.74m 0.10m 0.32m 0.22m 0.43m 0.63m  Arousal Index Apnea 0.0  0.0 0.0 0.0 0.0 0.0   Hypopnea 0.0 0.0 0.0 0.0 0.0 0.0   LM 0.0 0.0 0.0 0.0 0.0 0.0   Spontaneous 0.0 1.9 2.4 0.0 14.7 16.4   [facility] BiPAP Summary    General Information  Name: Christopher Yu, Christopher Yu BMI: 1.68 Physician: Melvyn Novas, MD  ID: [id number] Height: 176.0 in Technician: Margaretann Loveless, RPSGT  Sex: Male Weight: 74.0 lb Record: [id number]  Age: 27 [1960-06-18] Date: 06/15/2023     Medical & Medication History    [facility] brain, referred by Dr Shearon Stalls, DO .Fidela Juneau Shawnn Bouillon is a 63 y.o. male Yu who is seen upon referral on 03/05/2023 from Rehab physician Dr Shearon Stalls , for constant fatigue since having suffered a stroke in March 2024. Christopher Yu had undergone 3 brain surgeries for AVM, pseudo-meningocele, NPH , VP shunt - prior to Christopher stroke, has been diagnosed with ataxic Tremor after Christopher stroke ( referred by [facility]). Dr Tat has told Christopher Yu that there is little benefit from any medication. He is depressed as he can't GOLF any longer. Christopher Yu goes to bed at 11-12 PM and has difficulties to fall asleep- CHRONIC INSOMNIA for ever , continues to sleep for 90 minutes , then wakes for bathroom break, then sleeps again 3 hours. Chief concern according to Yu : " I just sleep poorly, I had sleep studies 10-15  year ago, first one was positive for apnea, Christopher second one was negative. Snoring, my wife witnesses pausing". Calculated pAHI (per hour): 68.1/h -50% of events were central sleep apnea Christopher other health is presumed obstructive in origin. REM pAHI: 58.7/h NREM pAHI: 70/h Supine AHI: 61.7/h versus right lateral sleep was 77.2/h, left lateral sleep with 67.2/h. There was no positional dependence noted. Snoring level reached a mean volume of 43 dB and was present for about a third of Christopher total recorded sleep time.  Tylenol, Fosamax, Lacrilube, Tums, Vitamin D3, Pepcid, Folvite, Rheumatrex, Toprol-XL, Multivitamin, Prednisone, Rituxan,  Crestor, Zoloft, Aldactone, Azulfidine, TraMADol, Trazodone, Genteal Tears Night-Time   Sleep Disorder      Comments   Christopher Yu came into Christopher sleep lab for a CPAP titration study. Christopher Yu had a HST with our sleep lab on 04/15/23. Christopher Yu took Trazodone prior to sleep study. Per Christopher Yu he did not take Tramadol. Christopher Yu was fitted with F&P Evora (FFM) size S-M. Mask was a good fit to face and Christopher Yu tolerated Christopher mask well. CPAP was initiated at [facility]. CPAP was titrated up to 17cmH2O with EPR of 2. Christopher Yu was switched to BiPAP due to needing a higher pressure and for Yu comfort. BiPAP was started at 19/15 cmH2O. No restroom breaks. EKG showed arrhythmia. Christopher Yu has a known cardiac history. No snoring. Respiratory events scored with a 4% desat. Christopher Yu slept lateral. Some CSA observed at Christopher beginning of Christopher study. There was not enough CSA to change mode of therapy.     CPAP start time: 10:18:53 PM CPAP end time: 05:13:25 AM   Time Total Supine Side Prone Upright  Recording (TRT) 6h 55.24m 0h 1.46m 6h 54.70m 0h 0.68m 0h 0.29m  Sleep (TST) 5h 57.73m 0h 0.9m 5h 57.52m 0h 0.58m 0h 0.23m   Latency N1 N2 N3 REM Onset Per. Slp. Eff.  Actual 0h 25.33m 0h 29.60m 0h 44.100m 3h 20.42m 0h 25.38m 0h 27.61m 86.02%   Stg Dur Wake N1 N2 N3 REM  Total 44.0 5.0 174.0 138.0 40.0  Supine 1.0 0.0 0.0 0.0 0.0  Side 43.0 5.0 174.0 138.0 40.0  Prone 0.0 0.0 0.0 0.0 0.0  Upright 0.0 0.0 0.0 0.0 0.0   Stg % Wake N1 N2 N3 REM  Total 11.0 1.4 48.7 38.7 11.2  Supine 0.2 0.0 0.0 0.0 0.0  Side 10.7 1.4 48.7 38.7 11.2  Prone 0.0 0.0 0.0 0.0 0.0  Upright 0.0 0.0 0.0 0.0 0.0     Apnea Summary Sub Supine Side Prone Upright  Total 8 Total 8 0 8 0 0    REM 0 0 0 0 0    NREM 8 0 8 0 0  Obs 2 REM 0 0 0 0 0    NREM 2 0 2 0 0  Mix 0 REM 0 0 0 0 0    NREM 0 0 0 0 0  Cen 6 REM 0 0 0 0 0    NREM 6 0 6 0 0   Rera Summary Sub Supine Side Prone Upright  Total 0 Total 0 0 0 0 0    REM 0 0 0 0 0     NREM 0 0 0 0 0   Hypopnea Summary Sub Supine Side Prone Upright  Total 87 Total 87 0 87 0 0    REM 0 0 0 0 0    NREM 87 0 87 0 0   4% Hypopnea Summary Sub  Supine Side Prone Upright  Total (4%) 84 Total 84 0 84 0 0    REM 0 0 0 0 0    NREM 84 0 84 0 0     AHI Total Obs Mix Cen  15.97 Apnea 1.34 0.34 0.00 1.01   Hypopnea 14.62 -- -- --  15.46 Hypopnea (4%) 14.12 -- -- --    Total Supine Side Prone Upright  Position AHI 15.97 0.00 15.97 0.00 0.00  REM AHI 0.00   NREM AHI 17.98   Position RDI 15.97 0.00 15.97 0.00 0.00  REM RDI 0.00   NREM RDI 17.98    4% Hypopnea Total Supine Side Prone Upright  Position AHI (4%) 15.46 0.00 15.46 0.00 0.00  REM AHI (4%) 0.00   NREM AHI (4%) 17.41   Position RDI (4%) 15.46 0.00 15.46 0.00 0.00  REM RDI (4%) 0.00   NREM RDI (4%) 17.41    Desaturation Information  <100% <90% <80% <70% <60% <50% <40%  Supine 0 0 0 0 0 0 0  Side 94 1 0 0 0 0 0  Prone 0 0 0 0 0 0 0  Upright 0 0 0 0 0 0 0  Total 94 1 0 0 0 0 0  Desaturation threshold setting: 4% Minimum desaturation setting: 10 seconds SaO2 nadir: 89% Christopher longest event was a 40 sec obstructive Hypopnea with a minimum SaO2 of 91%. Christopher lowest SaO2 was 89% associated with a 18 sec central Apnea. EKG Rates EKG Avg Max Min  Awake 71 87 60  Asleep 66 86 57  EKG Events: N/A Awakening/Arousal Information # of Awakenings 10  Wake after sleep onset 32.35m  Wake after persistent sleep 31.9m   Arousal Assoc. Arousals Index  Apneas 2 0.3  Hypopneas 1 0.2  Leg Movements 0 0.0  Snore 0.0 0.0  PTT Arousals 0 0.0  Spontaneous 21 3.5  Total 23 3.9  Myoclonus Information PLMS LMs Index  Total LMs during PLMS 0 0.0  LMs w/ Microarousals 0 0.0   LM LMs Index  w/ Microarousal 0 0.0  w/ Awakening 0 0.0  w/ Resp Event 0 0.0  Spontaneous 4 0.7  Total 4 0.7

## 2023-06-24 ENCOUNTER — Ambulatory Visit: Payer: BC Managed Care – PPO | Admitting: Psychology

## 2023-06-24 DIAGNOSIS — F321 Major depressive disorder, single episode, moderate: Secondary | ICD-10-CM

## 2023-06-24 DIAGNOSIS — F329 Major depressive disorder, single episode, unspecified: Secondary | ICD-10-CM

## 2023-06-24 NOTE — Progress Notes (Signed)
[facility] Counselor Initial Adult Exam  Name: Christopher Yu Date: 06/24/2023 MRN: [id number] DOB: 1960/10/17 PCP: Thana Ates, MD    Guardian/Payee:  N/A    Paperwork requested: No   Reason for Visit Christopher Yu Problem: Adjustment to serious medical illness  Mental Status Exam: Appearance:   Casual     Behavior:  Appropriate  Motor:  Shuffling Gait  Speech/Language:   Slurred  Affect:  Depressed  Mood:  depressed  Thought process:  normal  Thought content:    WNL  Sensory/Perceptual disturbances:    WNL  Orientation:  oriented to person, place, and situation  Attention:  Good  Concentration:  Good  Memory:  WNL  Fund of knowledge:   Good  Insight:    Good  Judgment:   unknown  Impulse Control:  unknown    Reported Symptoms:  Sadness, helplessness and hopelessness  Risk Assessment: Danger to Self:  No Self-injurious Behavior: No Danger to Others: No Duty to Warn:no Physical Aggression / Violence:No  Access to Firearms a concern:  Unknown Gang Involvement:No  Patient / guardian was educated about steps to take if suicide or homicide risk level increases between visits: n/a While future psychiatric events cannot be accurately predicted, the patient does not currently require acute inpatient psychiatric care and does not currently meet [facility] involuntary commitment criteria.  Substance Abuse  History: Current substance abuse: No     Past Psychiatric History:   No previous psychological problems have been observed Outpatient Providers:N/A History of Psych Hospitalization: No  Psychological Testing:  none    Abuse History:  Victim of:   N/A   Report needed: No. Victim of Neglect:No. Perpetrator of  N/A   Witness / Exposure to Domestic Violence: No   Protective Services Involvement: No  Witness to [employer] Violence:  No   Family History:  Family History  Problem Relation Age of Onset  Other Father        MVA   Lupus Sister     Living situation: the patient lives with their family  Sexual Orientation: Straight  Relationship Status: married  Name of spouse [name] If a parent, number of children / ages:Two girls, ages 6 and 72  Support Systems: spouse  Surveyor, quantity Stress:  Yes   Income/Employment/Disability: Software engineer:  unknown  Educational History: Education:  unknown  Oncologist: unknown  Any cultural differences that may affect / interfere with treatment:  not applicable   Recreation/Hobbies: golfing  Stressors: Financial difficulties   Health problems    Strengths: Family  Barriers:  severe physical limitations that compromises activity   Legal History: Pending legal issue / charges: The patient has no significant history of legal issues. History of legal issue / charges:  N/A  Medical History/Surgical History: reviewed Past Medical History:  Diagnosis Date   Anxiety    Arthritis, rheumatoid (HCC)    dx 1988   CAD (coronary artery disease), native coronary artery    Mild LAD disease with calcification noted at cath 12//27/16    Cardiomyopathy (HCC)    Cardiomyopathy (HCC)    Chronic systolic heart failure (HCC) 05/01/2015   Depression    Deviated nasal septum 06/25/2011   Headache    High cholesterol    Hyperlipidemia    Impingement syndrome of left shoulder 12/22/2017   Macular  degeneration    Nausea and vomiting 08/28/2022   Rheumatoid aortitis    Sleep apnea    does not use cpap - UNABLE TO TOLERATE MASK   Sleep apnea in adult    deviated septum repaired, most recent sleep study was negative   Status post total right knee replacement 06/01/2015    Past Surgical History:  Procedure Laterality Date   ANKLE FUSION Right 05/05/2012   related to his arthritis   ANKLE FUSION Left 05/25/2013   related to his arthritis   ANKLE SURGERY Bilateral    APPLICATION OF CRANIAL NAVIGATION N/A 04/11/2022   Procedure: APPLICATION OF CRANIAL NAVIGATION;  Surgeon: Lisbeth Renshaw, MD;  Location: [facility] OR;  Service: Neurosurgery;  Laterality: N/A;   APPLICATION OF CRANIAL NAVIGATION N/A 08/08/2022   Procedure: APPLICATION OF CRANIAL NAVIGATION;  Surgeon: Lisbeth Renshaw, MD;  Location: [facility] OR;  Service: Neurosurgery;  Laterality: N/A;   BIOPSY  06/09/2023   Procedure: BIOPSY;  Surgeon: Kerin Salen, MD;  Location: [facility];  Service: Gastroenterology;;   CARDIAC CATHETERIZATION N/A 05/01/2015   Procedure: Left Heart Cath and Coronary Angiography;  Surgeon: Lyn Records, MD;  Location: [facility] INVASIVE CV LAB;  Service: Cardiovascular;  Laterality: N/A;   COLONOSCOPY WITH PROPOFOL N/A 06/09/2023   Procedure: COLONOSCOPY WITH PROPOFOL;  Surgeon: Kerin Salen, MD;  Location: [facility];  Service: Gastroenterology;  Laterality: N/A;   CRANIOTOMY N/A 04/11/2022   Procedure: STEREOTACTIC SUBOCCIPITAL CRANIOTOMY FOR RESECTION OF ARTERIO-VENOUS MALFORMATION;  Surgeon: Lisbeth Renshaw, MD;  Location: [facility] OR;  Service: Neurosurgery;  Laterality: N/A;   ESOPHAGOGASTRODUODENOSCOPY (EGD) WITH PROPOFOL N/A 06/09/2023   Procedure: ESOPHAGOGASTRODUODENOSCOPY (EGD) WITH PROPOFOL;  Surgeon: Kerin Salen, MD;  Location: [facility];  Service: Gastroenterology;  Laterality: N/A;   FRACTURE SURGERY     left femur fracture x 3   HEMOSTASIS CLIP PLACEMENT  06/09/2023   Procedure: HEMOSTASIS CLIP  PLACEMENT;  Surgeon: Kerin Salen, MD;  Location: [facility];  Service: Gastroenterology;;   IR ANGIO EXTERNAL CAROTID SEL EXT CAROTID BILAT MOD SED  01/28/2022  IR ANGIO INTRA EXTRACRAN SEL INTERNAL CAROTID BILAT MOD SED  01/28/2022   IR ANGIO INTRA EXTRACRAN SEL INTERNAL CAROTID BILAT MOD SED  04/16/2022   IR ANGIO VERTEBRAL SEL VERTEBRAL BILAT MOD SED  01/28/2022   IR ANGIO VERTEBRAL SEL VERTEBRAL BILAT MOD SED  04/16/2022   KNEE ARTHROSCOPY     right x 4   LAPAROSCOPIC REVISION VENTRICULAR-PERITONEAL (V-P) SHUNT N/A 08/08/2022   Procedure: LAPAROSCOPIC VENTRICULAR-PERITONEAL (V-P) SHUNT;  Surgeon: Harriette Bouillon, MD;  Location: [facility];  Service: General;  Laterality: N/A;   LUMBAR LAMINECTOMY/DECOMPRESSION MICRODISCECTOMY N/A 07/18/2022   Procedure: Repair of Pseudomeningiocele posterior;  Surgeon: Lisbeth Renshaw, MD;  Location: [facility];  Service: Neurosurgery;  Laterality: N/A;   NASAL SEPTOPLASTY W/ TURBINOPLASTY  06/25/2011   Procedure: NASAL SEPTOPLASTY WITH TURBINATE REDUCTION;  Surgeon: Osborn Coho, MD;  Location: [facility] OR;  Service: ENT;  Laterality: Bilateral;   PLACEMENT OF LUMBAR DRAIN N/A 07/18/2022   Procedure: PLACEMENT OF LUMBAR DRAIN;  Surgeon: Lisbeth Renshaw, MD;  Location: [facility];  Service: Neurosurgery;  Laterality: N/A;   POLYPECTOMY  06/09/2023   Procedure: POLYPECTOMY;  Surgeon: Kerin Salen, MD;  Location: [facility];  Service: Gastroenterology;;   TONSILLECTOMY     TOTAL KNEE ARTHROPLASTY Right 06/01/2015   Procedure: RIGHT TOTAL KNEE ARTHROPLASTY;  Surgeon: Kathryne Hitch, MD;  Location: [facility];  Service: Orthopedics;  Laterality: Right;  Block+general   VENTRICULOPERITONEAL SHUNT Right 08/08/2022   Procedure: LAP ASSITED SHUNT INSERTION VENTRICULAR-PERITONEAL;  Surgeon: Lisbeth Renshaw, MD;  Location: [facility];  Service: Neurosurgery;  Laterality: Right;    Medications: Current Outpatient Medications  Medication Sig Dispense Refill   acetaminophen  (TYLENOL) 325 MG tablet Take 2 tablets (650 mg total) by mouth 3 (three) times daily.     alendronate (FOSAMAX) 10 MG tablet Take 10 mg by mouth daily before breakfast. Take with a full glass of water on an empty stomach.     artificial tears (LACRILUBE) OINT ophthalmic ointment Place into both eyes 2 (two) times daily.     aspirin EC 81 MG tablet Take 1 tablet (81 mg total) by mouth daily. Swallow whole. 30 tablet 12   calcium carbonate (TUMS - DOSED IN MG ELEMENTAL CALCIUM) 500 MG chewable tablet Chew 1 tablet (200 mg of elemental calcium total) by mouth daily with supper.     Cholecalciferol (VITAMIN D3) 50 MCG (2000 UT) TABS Take 2,000 Units by mouth daily.     famotidine (PEPCID) 40 MG tablet 1 tablet Orally Once a day for 30 days     folic acid (FOLVITE) 1 MG tablet Take 1 mg by mouth daily.     methotrexate (RHEUMATREX) 2.5 MG tablet Take 20 mg by mouth once a week. Takes 8 tablets(20mg ) every Saturday.     Caution:Chemotherapy. Protect from light.     metoprolol succinate (TOPROL-XL) 25 MG 24 hr tablet TAKE 1 TABLET DAILY 90 tablet 3   Multiple Vitamins-Minerals (PRESERVISION AREDS 2 PO) Take 2 capsules by mouth daily.     predniSONE (STERAPRED UNI-PAK 21 TAB) 5 MG (21) TBPK tablet Take 5 mg by mouth every morning.     RITUXAN 500 MG/50ML injection See admin instructions. Every 6 months 2 dose infusion Ruxience     rosuvastatin (CRESTOR) 20 MG tablet TAKE 1 TABLET DAILY (DOSE INCREASE) 90 tablet 2   sertraline (ZOLOFT) 20 MG/ML concentrated solution Take 25 mg by mouth daily.     spironolactone (ALDACTONE) 25 MG tablet 12.5 mg. 1/2 tab daily  sulfaSALAzine (AZULFIDINE) 500 MG tablet Take 1,000 mg by mouth 2 (two) times daily.     traMADol (ULTRAM) 50 MG tablet Take 1 tablet (50 mg total) by mouth every 12 (twelve) hours as needed for moderate pain. (Patient taking differently: Take 50 mg by mouth as needed for moderate pain (pain score 4-6).) 60 tablet 2   traZODone (DESYREL) 50 MG  tablet TAKE 1.5 TABLETS (75 MG TOTAL) BY MOUTH AT BEDTIME AS NEEDED. FOR SLEEP 135 tablet 5   White Petrolatum-Mineral Oil (GENTEAL TEARS NIGHT-TIME OP) Apply to eye at bedtime.     No current facility-administered medications for this visit.    Allergies  Allergen Reactions   Hydroxychloroquine     Other Reaction(s): cardiolmyopathy    Diagnoses:  Major depression  Plan of Care: Outpatient psychotherapy and medication management as needed. Initial Note: (With wife [name]). She says that she has hoped Christopher Yu would get into therapy. He has Ataxic Tremor. He told Dr. Arbutus Leas that he would like medication to just sit and do nothing. He says he always had golf to rey on and now cannot play. In December, he went n for a surgery to remove an avm from brain. Was told it was a routine surgery and was told that he needs to get it out. Was told it would be a short recovery and he would be back to work. 1 1/2 hour surgery turned into 6 hour surgery and he had complications. He had extra cerebral fluid and Dr. York Spaniel it would absorb and just wait. He kept declining and by March, they went in to try and drain the extra fluid. He was in hospital for a week. Wife noticed that during the hospital stay, his speech declined and he was clearly weak. Nurses were trying to discharge him and she protested, but they sent him home anyway. He declined, and could not sit, stand, talk or walk and she ended up calling ambulance. In ER, she ws told he had a stroke. He was in ER 24 hours before getting a room in Neuro area. Head of stroke team came in and said it was not a stroke, but complications from surgery. She was blamed by medical team for not "speaking up" before being initially discharged. He ended up in a rehab for 2 weeks and told them he needed another surgery. Was told he had hydrocephalus and need a shunt to drain liquid off his brain. She felt a lot of pressure from team that he needed surgery. She wanted a second opinion  and had rouble getting it. Spoke to another Dr. and they decided to get the third surgery. He was in OT,PT and Speech Therapy. During therapy, he started having a tremor. They ended up being referred to Dr. Arbutus Leas, who has now told him that treating his tremor is tricky and will take time.  Christopher Yu is no longer in OT,PT speech therapy. He is supposed to be doing exercises on his own, but has not been emotionally able to ge himself to do it. He has persistent headaches as well. Just had a CAT Scan and awaiting the results. His headaches are worse and they are concerned. He also has rhumatory arthritis and it is flaring up because he had to stop meds. He has cardiomyopathy, macular degeneration on top of all of these other conditions.  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 He has been approved  for disability and are working to get social security disability. She works part time. They have 2 daughters, one with diabetes and arthritis. She is heading to [employer] this Fall and wants to work in Research scientist (physical sciences). Other daughter is 35 and is "our introvert with lots of anxiety". She is in therapy and has mental health issues that is being treated. She will be a Holiday representative at the middle college at [employer].  They are told that medicine will not control his condition, but they will have to try to retrain his brain. Suggested that they work to get back into OT and PT. She has a hard time motivating him. He admits that he keeps things "inside". She says he is not a good comunicator. He will not share things like if he is in pain or hurts himself.   Tx. Plan/Goals: Patient reports depressive symptoms secondary to the medical trauma he has suffered and the debilitating physical results. He is seeking therapy to resolve his feelings of helplessness and hopelessness. Also want assistance in improving his drive/motivation in daily activity. Will engage in therapy that  utilizes behavioral approaches to mitigate symptoms along with insight oriented counseling. Will also incorporate some conjoint sessions with spouse as needed, given her integral involvement with his care. Patient agrees with plan for treatment. Goal Date is 12-25    Patient agrees to be seen in the provider's office. Session Note:  Patient states that he is doing okay and has not experienced much change. Has been sleeping a lot lately and says it is partly related to his depression which still remains. We talked about his perceptions about his golf game and the great desire to get back to what he "used to be". He does not really have too many goals or things that he wants to accomplish. Does not have many activities with his wife. He says he has to be more cautious about what he does because he is more susceptible at getting sick. We are striving to find some goals that will help keep him focused and optimistic.                                                       Garrel Ridgel, PhD 4:10p-5:00 50 minutes

## 2023-06-25 ENCOUNTER — Telehealth: Payer: Self-pay | Admitting: Neurology

## 2023-06-25 NOTE — Telephone Encounter (Signed)
-----   Message from Hickory Dohmeier sent at 06/22/2023 12:41 PM EST -----  A BIPAP machine with a BiPAP setting of 20/ 16 cm water, the AHI was 0/h but the sleep efficiency was not as good.  There was REM sleep seen at the last explored pressure.   Heated humidification will be included. The patient will remain on prn trazodone.  Evora mask FFM in s/m size.

## 2023-07-01 ENCOUNTER — Ambulatory Visit: Payer: BC Managed Care – PPO | Admitting: Psychology

## 2023-07-01 DIAGNOSIS — F329 Major depressive disorder, single episode, unspecified: Secondary | ICD-10-CM

## 2023-07-01 DIAGNOSIS — F321 Major depressive disorder, single episode, moderate: Secondary | ICD-10-CM

## 2023-07-01 NOTE — Progress Notes (Signed)
 [facility] Counselor Initial Adult Exam  Name: Christopher Yu Date: 07/01/2023 MRN: [id number] DOB: 1960-06-23 PCP: Thana Ates, MD    Guardian/Payee:  N/A    Paperwork requested: No   Reason for Visit [name] Problem: Adjustment to serious medical illness  Mental Status Exam: Appearance:   Casual     Behavior:  Appropriate  Motor:  Shuffling Gait  Speech/Language:   Slurred  Affect:  Depressed  Mood:  depressed  Thought process:  normal  Thought content:    WNL  Sensory/Perceptual disturbances:    WNL  Orientation:  oriented to person, place, and situation  Attention:  Good  Concentration:  Good  Memory:  WNL  Fund of knowledge:   Good  Insight:    Good  Judgment:   unknown  Impulse Control:  unknown    Reported Symptoms:  Sadness, helplessness and hopelessness  Risk Assessment: Danger to Self:  No Self-injurious Behavior: No Danger to Others: No Duty to Warn:no Physical Aggression / Violence:No  Access to Firearms a concern:  Unknown Gang Involvement:No  Patient / guardian was educated about steps to take if suicide or homicide risk level increases between visits: n/a While future psychiatric events cannot be accurately predicted, the patient does not currently require acute inpatient psychiatric care and does not currently meet [facility] involuntary commitment  criteria.  Substance Abuse History: Current substance abuse: No     Past Psychiatric History:   No previous psychological problems have been observed Outpatient Providers:N/A History of Psych Hospitalization: No  Psychological Testing:  none    Abuse History:  Victim of:   N/A   Report needed: No. Victim of Neglect:No. Perpetrator of  N/A   Witness / Exposure to Domestic Violence: No   Protective Services Involvement: No  Witness to [employer] Violence:  No   Family History:  Family History  Problem Relation Age of Onset   Other Father        MVA   Lupus Sister     Living situation: the patient lives with their family  Sexual Orientation: Straight  Relationship Status: married  Name of spouse Christopher Yu If a parent, number of children / ages:Two girls, ages 36 and 64  Support Systems: spouse  Surveyor, quantity Stress:  Yes   Income/Employment/Disability: Software engineer:  unknown  Educational History: Education:  unknown  Oncologist: unknown  Any cultural differences that may affect / interfere with treatment:  not applicable   Recreation/Hobbies: golfing  Stressors: Financial difficulties   Health problems    Strengths: Family  Barriers:  severe physical limitations that compromises activity   Legal History: Pending legal issue / charges: The patient has no significant history of legal issues. History of legal issue / charges:  N/A  Medical History/Surgical History: reviewed Past Medical History:  Diagnosis Date   Anxiety    Arthritis, rheumatoid (HCC)    dx 1988   CAD (coronary artery disease), native coronary artery    Mild LAD disease with calcification noted at cath 12//27/16    Cardiomyopathy (HCC)    Cardiomyopathy (HCC)    Chronic systolic heart failure (HCC) 05/01/2015   Depression    Deviated nasal septum 06/25/2011   Headache    High cholesterol    Hyperlipidemia    Impingement syndrome of left  shoulder 12/22/2017   Macular degeneration    Nausea and vomiting 08/28/2022   Rheumatoid aortitis    Sleep apnea    does not use cpap - UNABLE TO TOLERATE MASK   Sleep apnea in adult    deviated septum repaired, most recent sleep study was negative   Status post total right knee replacement 06/01/2015    Past Surgical History:  Procedure Laterality Date   ANKLE FUSION Right 05/05/2012   related to his arthritis   ANKLE FUSION Left 05/25/2013   related to his arthritis   ANKLE SURGERY Bilateral    APPLICATION OF CRANIAL NAVIGATION N/A 04/11/2022   Procedure: APPLICATION OF CRANIAL NAVIGATION;  Surgeon: Lisbeth Renshaw, MD;  Location: [facility] OR;  Service: Neurosurgery;  Laterality: N/A;   APPLICATION OF CRANIAL NAVIGATION N/A 08/08/2022   Procedure: APPLICATION OF CRANIAL NAVIGATION;  Surgeon: Lisbeth Renshaw, MD;  Location: [facility] OR;  Service: Neurosurgery;  Laterality: N/A;   BIOPSY  06/09/2023   Procedure: BIOPSY;  Surgeon: Kerin Salen, MD;  Location: [facility];  Service: Gastroenterology;;   CARDIAC CATHETERIZATION N/A 05/01/2015   Procedure: Left Heart Cath and Coronary Angiography;  Surgeon: Lyn Records, MD;  Location: [facility] INVASIVE CV LAB;  Service: Cardiovascular;  Laterality: N/A;   COLONOSCOPY WITH PROPOFOL N/A 06/09/2023   Procedure: COLONOSCOPY WITH PROPOFOL;  Surgeon: Kerin Salen, MD;  Location: [facility];  Service: Gastroenterology;  Laterality: N/A;   CRANIOTOMY N/A 04/11/2022   Procedure: STEREOTACTIC SUBOCCIPITAL CRANIOTOMY FOR RESECTION OF ARTERIO-VENOUS MALFORMATION;  Surgeon: Lisbeth Renshaw, MD;  Location: [facility];  Service: Neurosurgery;  Laterality: N/A;   ESOPHAGOGASTRODUODENOSCOPY (EGD) WITH PROPOFOL N/A 06/09/2023   Procedure: ESOPHAGOGASTRODUODENOSCOPY (EGD) WITH PROPOFOL;  Surgeon: Kerin Salen, MD;  Location: [facility];  Service: Gastroenterology;  Laterality: N/A;   FRACTURE SURGERY     left femur fracture x 3   HEMOSTASIS CLIP PLACEMENT  06/09/2023    Procedure: HEMOSTASIS CLIP PLACEMENT;  Surgeon: Kerin Salen, MD;  Location: [facility];  Service:  Gastroenterology;;   IR ANGIO EXTERNAL CAROTID SEL EXT CAROTID BILAT MOD SED  01/28/2022   IR ANGIO INTRA EXTRACRAN SEL INTERNAL CAROTID BILAT MOD SED  01/28/2022   IR ANGIO INTRA EXTRACRAN SEL INTERNAL CAROTID BILAT MOD SED  04/16/2022   IR ANGIO VERTEBRAL SEL VERTEBRAL BILAT MOD SED  01/28/2022   IR ANGIO VERTEBRAL SEL VERTEBRAL BILAT MOD SED  04/16/2022   KNEE ARTHROSCOPY     right x 4   LAPAROSCOPIC REVISION VENTRICULAR-PERITONEAL (V-P) SHUNT N/A 08/08/2022   Procedure: LAPAROSCOPIC VENTRICULAR-PERITONEAL (V-P) SHUNT;  Surgeon: Harriette Bouillon, MD;  Location: [facility];  Service: General;  Laterality: N/A;   LUMBAR LAMINECTOMY/DECOMPRESSION MICRODISCECTOMY N/A 07/18/2022   Procedure: Repair of Pseudomeningiocele posterior;  Surgeon: Lisbeth Renshaw, MD;  Location: [facility];  Service: Neurosurgery;  Laterality: N/A;   NASAL SEPTOPLASTY W/ TURBINOPLASTY  06/25/2011   Procedure: NASAL SEPTOPLASTY WITH TURBINATE REDUCTION;  Surgeon: Osborn Coho, MD;  [facility] OR;  Service: ENT;  Laterality: Bilateral;   PLACEMENT OF LUMBAR DRAIN N/A 07/18/2022   Procedure: PLACEMENT OF LUMBAR DRAIN;  Surgeon: Lisbeth Renshaw, MD;  Location: [facility];  Service: Neurosurgery;  Laterality: N/A;   POLYPECTOMY  06/09/2023   Procedure: POLYPECTOMY;  Surgeon: Kerin Salen, MD;  Location: [facility];  Service: Gastroenterology;;   TONSILLECTOMY     TOTAL KNEE ARTHROPLASTY Right 06/01/2015   Procedure: RIGHT TOTAL KNEE ARTHROPLASTY;  Surgeon: Kathryne Hitch, MD;  Location: [facility];  Service: Orthopedics;  Laterality: Right;  Block+general   VENTRICULOPERITONEAL SHUNT Right 08/08/2022   Procedure: LAP ASSITED SHUNT INSERTION VENTRICULAR-PERITONEAL;  Surgeon: Lisbeth Renshaw, MD;  Location: [facility] OR;  Service: Neurosurgery;  Laterality: Right;    Medications: Current Outpatient Medications  Medication Sig Dispense  Refill   acetaminophen (TYLENOL) 325 MG tablet Take 2 tablets (650 mg total) by mouth 3 (three) times daily.     alendronate (FOSAMAX) 10 MG tablet Take 10 mg by mouth daily before breakfast. Take with a full glass of water on an empty stomach.     artificial tears (LACRILUBE) OINT ophthalmic ointment Place into both eyes 2 (two) times daily.     aspirin EC 81 MG tablet Take 1 tablet (81 mg total) by mouth daily. Swallow whole. 30 tablet 12   calcium carbonate (TUMS - DOSED IN MG ELEMENTAL CALCIUM) 500 MG chewable tablet Chew 1 tablet (200 mg of elemental calcium total) by mouth daily with supper.     Cholecalciferol (VITAMIN D3) 50 MCG (2000 UT) TABS Take 2,000 Units by mouth daily.     famotidine (PEPCID) 40 MG tablet 1 tablet Orally Once a day for 30 days     folic acid (FOLVITE) 1 MG tablet Take 1 mg by mouth daily.     methotrexate (RHEUMATREX) 2.5 MG tablet Take 20 mg by mouth once a week. Takes 8 tablets(20mg ) every Saturday.     Caution:Chemotherapy. Protect from light.     metoprolol succinate (TOPROL-XL) 25 MG 24 hr tablet TAKE 1 TABLET DAILY 90 tablet 3   Multiple Vitamins-Minerals (PRESERVISION AREDS 2 PO) Take 2 capsules by mouth daily.     predniSONE (STERAPRED UNI-PAK 21 TAB) 5 MG (21) TBPK tablet Take 5 mg by mouth every morning.     RITUXAN 500 MG/50ML injection See admin instructions. Every 6 months 2 dose infusion Ruxience     rosuvastatin (CRESTOR) 20 MG tablet TAKE 1 TABLET DAILY (DOSE INCREASE) 90 tablet 2   sertraline (ZOLOFT) 20 MG/ML concentrated solution Take 25 mg by  mouth daily.     spironolactone (ALDACTONE) 25 MG tablet 12.5 mg. 1/2 tab daily     sulfaSALAzine (AZULFIDINE) 500 MG tablet Take 1,000 mg by mouth 2 (two) times daily.     traMADol (ULTRAM) 50 MG tablet Take 1 tablet (50 mg total) by mouth every 12 (twelve) hours as needed for moderate pain. (Patient taking differently: Take 50 mg by mouth as needed for moderate pain (pain score 4-6).) 60 tablet 2    traZODone (DESYREL) 50 MG tablet TAKE 1.5 TABLETS (75 MG TOTAL) BY MOUTH AT BEDTIME AS NEEDED. FOR SLEEP 135 tablet 5   White Petrolatum-Mineral Oil (GENTEAL TEARS NIGHT-TIME OP) Apply to eye at bedtime.     No current facility-administered medications for this visit.    Allergies  Allergen Reactions   Hydroxychloroquine     Other Reaction(s): cardiolmyopathy    Diagnoses:  Major depression  Plan of Care: Outpatient psychotherapy and medication management as needed. Initial Note: (With wife Christopher Yu). She says that she has hoped Fernie would get into therapy. He has Ataxic Tremor. He told Dr. Arbutus Leas that he would like medication to just sit and do nothing. He says he always had golf to rey on and now cannot play. In December, he went n for a surgery to remove an avm from brain. Was told it was a routine surgery and was told that he needs to get it out. Was told it would be a short recovery and he would be back to work. 1 1/2 hour surgery turned into 6 hour surgery and he had complications. He had extra cerebral fluid and Dr. York Spaniel it would absorb and just wait. He kept declining and by March, they went in to try and drain the extra fluid. He was in hospital for a week. Wife noticed that during the hospital stay, his speech declined and he was clearly weak. Nurses were trying to discharge him and she protested, but they sent him home anyway. He declined, and could not sit, stand, talk or walk and she ended up calling ambulance. In ER, she ws told he had a stroke. He was in ER 24 hours before getting a room in Neuro area. Head of stroke team came in and said it was not a stroke, but complications from surgery. She was blamed by medical team for not "speaking up" before being initially discharged. He ended up in a rehab for 2 weeks and told them he needed another surgery. Was told he had hydrocephalus and need a shunt to drain liquid off his brain. She felt a lot of pressure from team that he needed surgery.  She wanted a second opinion and had rouble getting it. Spoke to another Dr. and they decided to get the third surgery. He was in OT,PT and Speech Therapy. During therapy, he started having a tremor. They ended up being referred to Dr. Arbutus Leas, who has now told him that treating his tremor is tricky and will take time.  Athanasius is no longer in OT,PT speech therapy. He is supposed to be doing exercises on his own, but has not been emotionally able to ge himself to do it. He has persistent headaches as well. Just had a CAT Scan and awaiting the results. His headaches are worse and they are concerned. He also has rhumatory arthritis and it is flaring up because he had to stop meds. He has cardiomyopathy, macular degeneration on top of all of these other conditions.  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 He is still employed with insurance, but they fear he may be let go. He has been approved for disability and are working to get social security disability. She works part time. They have 2 daughters, one with diabetes and arthritis. She is heading to [employer] this Fall and wants to work in Research scientist (physical sciences). Other daughter is 55 and is "our introvert with lots of anxiety". She is in therapy and has mental health issues that is being treated. She will be a Holiday representative at the middle college at [employer].  They are told that medicine will not control his condition, but they will have to try to retrain his brain. Suggested that they work to get back into OT and PT. She has a hard time motivating him. He admits that he keeps things "inside". She says he is not a good comunicator. He will not share things like if he is in pain or hurts himself.   Tx. Plan/Goals: Patient reports depressive symptoms secondary to the medical trauma he has suffered and the debilitating physical results. He is seeking therapy to resolve his feelings of helplessness and hopelessness. Also want assistance in improving his drive/motivation in daily activity.  Will engage in therapy that utilizes behavioral approaches to mitigate symptoms along with insight oriented counseling. Will also incorporate some conjoint sessions with spouse as needed, given her integral involvement with his care. Patient agrees with plan for treatment. Goal Date is 12-25    Patient agrees to be seen in the provider's office. Session Note:  Patient states that he is making an effort to get out of the house. He says he has been getting dizzy lately. Thinks it is related to "sitting around". I encouraged him to talk with his doctor about this symptom. He will send her a my chart note. We talked about his "prior life" and the things he would do for entertainment. He says golf was his primary activity and had given up mostother interests like hockey. [place].                                                         Garrel Ridgel, PhD 4:10p-5:00 50 minutes

## 2023-07-07 DIAGNOSIS — G4733 Obstructive sleep apnea (adult) (pediatric): Secondary | ICD-10-CM | POA: Diagnosis not present

## 2023-07-08 ENCOUNTER — Ambulatory Visit: Payer: BC Managed Care – PPO | Admitting: Psychology

## 2023-07-08 DIAGNOSIS — F329 Major depressive disorder, single episode, unspecified: Secondary | ICD-10-CM | POA: Diagnosis not present

## 2023-07-08 DIAGNOSIS — F321 Major depressive disorder, single episode, moderate: Secondary | ICD-10-CM

## 2023-07-08 NOTE — Progress Notes (Signed)
  Behavioral Health Counselor Initial Adult Exam  Name: Christopher Yu Date: 07/08/2023 MRN: [id number] DOB: 06/29/1960 PCP: Thana Ates, MD    Guardian/Payee:  N/A    Paperwork requested: No   Reason for Visit [name] Problem: Adjustment to serious medical illness  Mental Status Exam: Appearance:   Casual     Behavior:  Appropriate  Motor:  Shuffling Gait  Speech/Language:   Slurred  Affect:  Depressed  Mood:  depressed  Thought process:  normal  Thought content:    WNL  Sensory/Perceptual disturbances:    WNL  Orientation:  oriented to person, place, and situation  Attention:  Good  Concentration:  Good  Memory:  WNL  Fund of knowledge:   Good  Insight:    Good  Judgment:   unknown  Impulse Control:  unknown    Reported Symptoms:  Sadness, helplessness and hopelessness  Risk Assessment: Danger to Self:  No Self-injurious Behavior: No Danger to Others: No Duty to Warn:no Physical Aggression / Violence:No  Access to Firearms a concern:  Unknown Gang Involvement:No  Patient / guardian was educated about steps to take if suicide or homicide risk level increases between visits: n/a While future psychiatric events cannot be accurately predicted, the patient does not currently require acute inpatient psychiatric care and does not currently meet [facility]  involuntary commitment criteria.  Substance Abuse History: Current substance abuse: No     Past Psychiatric History:   No previous psychological problems have been observed Outpatient Providers:N/A History of Psych Hospitalization: No  Psychological Testing:  none    Abuse History:  Victim of:   N/A   Report needed: No. Victim of Neglect:No. Perpetrator of  N/A   Witness / Exposure to  Domestic Violence: No   Protective Services Involvement: No  Witness to [employer] Violence:  No   Family History:  Family History  Problem Relation Age of Onset   Other Father        MVA   Lupus Sister     Living situation: the patient lives with their family  Sexual Orientation: Straight  Relationship Status: married  Name of spouse [name] If a parent, number of children / ages:Two girls, ages 5 and 62  Support Systems: spouse  Surveyor, quantity Stress:  Yes   Income/Employment/Disability: Software engineer:  unknown  Educational History: Education:  unknown  Oncologist: unknown  Any cultural differences that may affect / interfere with treatment:  not applicable   Recreation/Hobbies: golfing  Stressors: Financial difficulties   Health problems    Strengths: Family  Barriers:  severe physical limitations that compromises activity   Legal History: Pending legal issue / charges: The patient has no significant history of legal issues. History of legal issue / charges:  N/A  Medical History/Surgical History: reviewed Past Medical History:  Diagnosis Date   Anxiety    Arthritis, rheumatoid (HCC)    dx 1988   CAD (coronary artery disease), native coronary artery    Mild LAD disease with calcification noted at cath 12//27/16    Cardiomyopathy (HCC)    Cardiomyopathy (HCC)    Chronic systolic heart failure (HCC) 05/01/2015   Depression    Deviated nasal septum 06/25/2011   Headache    High cholesterol    Hyperlipidemia     Impingement syndrome of left shoulder 12/22/2017   Macular degeneration    Nausea and vomiting 08/28/2022   Rheumatoid aortitis    Sleep apnea    does not use cpap - UNABLE TO TOLERATE MASK   Sleep apnea in adult    deviated septum repaired, most recent sleep study was negative   Status post total right knee replacement 06/01/2015    Past Surgical History:  Procedure Laterality Date   ANKLE FUSION Right 05/05/2012   related to his arthritis   ANKLE FUSION Left 05/25/2013   related to his arthritis   ANKLE SURGERY Bilateral    APPLICATION OF CRANIAL NAVIGATION N/A 04/11/2022   Procedure: APPLICATION OF CRANIAL NAVIGATION;  Surgeon: Lisbeth Renshaw, MD;  Location: [facility] OR;  Service: Neurosurgery;  Laterality: N/A;   APPLICATION OF CRANIAL NAVIGATION N/A 08/08/2022   Procedure: APPLICATION OF CRANIAL NAVIGATION;  Surgeon: Lisbeth Renshaw, MD;  Location: [facility] OR;  Service: Neurosurgery;  Laterality: N/A;   BIOPSY  06/09/2023   Procedure: BIOPSY;  Surgeon: Kerin Salen, MD;  Location: [facility];  Service: Gastroenterology;;   CARDIAC CATHETERIZATION N/A 05/01/2015   Procedure: Left Heart Cath and Coronary Angiography;  Surgeon: Lyn Records, MD;  Location: [facility] INVASIVE CV LAB;  Service: Cardiovascular;  Laterality: N/A;   COLONOSCOPY WITH PROPOFOL N/A 06/09/2023   Procedure: COLONOSCOPY WITH PROPOFOL;  Surgeon: Kerin Salen, MD;  Location: [facility];  Service: Gastroenterology;  Laterality: N/A;   CRANIOTOMY N/A 04/11/2022   Procedure: STEREOTACTIC SUBOCCIPITAL CRANIOTOMY FOR RESECTION OF ARTERIO-VENOUS MALFORMATION;  Surgeon: Lisbeth Renshaw, MD;  Location: [facility];  Service: Neurosurgery;  Laterality: N/A;   ESOPHAGOGASTRODUODENOSCOPY (EGD) WITH PROPOFOL N/A 06/09/2023   Procedure: ESOPHAGOGASTRODUODENOSCOPY (EGD) WITH PROPOFOL;  Surgeon: Kerin Salen, MD;  Location: [facility];  Service: Gastroenterology;  Laterality: N/A;   FRACTURE SURGERY     left femur fracture x 3   HEMOSTASIS  CLIP PLACEMENT  06/09/2023  Procedure: HEMOSTASIS CLIP PLACEMENT;  Surgeon: Kerin Salen, MD;  Location: [facility];  Service: Gastroenterology;;   IR ANGIO EXTERNAL CAROTID SEL EXT CAROTID BILAT MOD SED  01/28/2022   IR ANGIO INTRA EXTRACRAN SEL INTERNAL CAROTID BILAT MOD SED  01/28/2022   IR ANGIO INTRA EXTRACRAN SEL INTERNAL CAROTID BILAT MOD SED  04/16/2022   IR ANGIO VERTEBRAL SEL VERTEBRAL BILAT MOD SED  01/28/2022   IR ANGIO VERTEBRAL SEL VERTEBRAL BILAT MOD SED  04/16/2022   KNEE ARTHROSCOPY     right x 4   LAPAROSCOPIC REVISION VENTRICULAR-PERITONEAL (V-P) SHUNT N/A 08/08/2022   Procedure: LAPAROSCOPIC VENTRICULAR-PERITONEAL (V-P) SHUNT;  Surgeon: Harriette Bouillon, MD;  Location: [facility];  Service: General;  Laterality: N/A;   LUMBAR LAMINECTOMY/DECOMPRESSION MICRODISCECTOMY N/A 07/18/2022   Procedure: Repair of Pseudomeningiocele posterior;  Surgeon: Lisbeth Renshaw, MD;  Location: [facility];  Service: Neurosurgery;  Laterality: N/A;   NASAL SEPTOPLASTY W/ TURBINOPLASTY  06/25/2011   Procedure: NASAL SEPTOPLASTY WITH TURBINATE REDUCTION;  Surgeon: Osborn Coho, MD;  Location[facility] OR;  Service: ENT;  Laterality: Bilateral;   PLACEMENT OF LUMBAR DRAIN N/A 07/18/2022   Procedure: PLACEMENT OF LUMBAR DRAIN;  Surgeon: Lisbeth Renshaw, MD;  Location: [facility];  Service: Neurosurgery;  Laterality: N/A;   POLYPECTOMY  06/09/2023   Procedure: POLYPECTOMY;  Surgeon: Kerin Salen, MD;  Location: [facility];  Service: Gastroenterology;;   TONSILLECTOMY     TOTAL KNEE ARTHROPLASTY Right 06/01/2015   Procedure: RIGHT TOTAL KNEE ARTHROPLASTY;  Surgeon: Kathryne Hitch, MD;  Location: [facility];  Service: Orthopedics;  Laterality: Right;  Block+general   VENTRICULOPERITONEAL SHUNT Right 08/08/2022   Procedure: LAP ASSITED SHUNT INSERTION VENTRICULAR-PERITONEAL;  Surgeon: Lisbeth Renshaw, MD;  Location: [facility] OR;  Service: Neurosurgery;  Laterality: Right;    Medications: Current Outpatient  Medications  Medication Sig Dispense Refill   acetaminophen (TYLENOL) 325 MG tablet Take 2 tablets (650 mg total) by mouth 3 (three) times daily.     alendronate (FOSAMAX) 10 MG tablet Take 10 mg by mouth daily before breakfast. Take with a full glass of water on an empty stomach.     artificial tears (LACRILUBE) OINT ophthalmic ointment Place into both eyes 2 (two) times daily.     aspirin EC 81 MG tablet Take 1 tablet (81 mg total) by mouth daily. Swallow whole. 30 tablet 12   calcium carbonate (TUMS - DOSED IN MG ELEMENTAL CALCIUM) 500 MG chewable tablet Chew 1 tablet (200 mg of elemental calcium total) by mouth daily with supper.     Cholecalciferol (VITAMIN D3) 50 MCG (2000 UT) TABS Take 2,000 Units by mouth daily.     famotidine (PEPCID) 40 MG tablet 1 tablet Orally Once a day for 30 days     folic acid (FOLVITE) 1 MG tablet Take 1 mg by mouth daily.     methotrexate (RHEUMATREX) 2.5 MG tablet Take 20 mg by mouth once a week. Takes 8 tablets(20mg ) every Saturday.     Caution:Chemotherapy. Protect from light.     metoprolol succinate (TOPROL-XL) 25 MG 24 hr tablet TAKE 1 TABLET DAILY 90 tablet 3   Multiple Vitamins-Minerals (PRESERVISION AREDS 2 PO) Take 2 capsules by mouth daily.     predniSONE (STERAPRED UNI-PAK 21 TAB) 5 MG (21) TBPK tablet Take 5 mg by mouth every morning.     RITUXAN 500 MG/50ML injection See admin instructions. Every 6 months 2 dose infusion Ruxience     rosuvastatin (CRESTOR) 20 MG tablet TAKE 1 TABLET DAILY (DOSE INCREASE)  90 tablet 2   sertraline (ZOLOFT) 20 MG/ML concentrated solution Take 25 mg by mouth daily.     spironolactone (ALDACTONE) 25 MG tablet 12.5 mg. 1/2 tab daily     sulfaSALAzine (AZULFIDINE) 500 MG tablet Take 1,000 mg by mouth 2 (two) times daily.     traMADol (ULTRAM) 50 MG tablet Take 1 tablet (50 mg total) by mouth every 12 (twelve) hours as needed for moderate pain. (Patient taking differently: Take 50 mg by mouth as needed for moderate pain  (pain score 4-6).) 60 tablet 2   traZODone (DESYREL) 50 MG tablet TAKE 1.5 TABLETS (75 MG TOTAL) BY MOUTH AT BEDTIME AS NEEDED. FOR SLEEP 135 tablet 5   White Petrolatum-Mineral Oil (GENTEAL TEARS NIGHT-TIME OP) Apply to eye at bedtime.     No current facility-administered medications for this visit.    Allergies  Allergen Reactions   Hydroxychloroquine     Other Reaction(s): cardiolmyopathy    Diagnoses:  Major depression  Plan of Care: Outpatient psychotherapy and medication management as needed. Initial Note: (With wife [name]). She says that she has hoped Maruice would get into therapy. He has Ataxic Tremor. He told Dr. Arbutus Leas that he would like medication to just sit and do nothing. He says he always had golf to rey on and now cannot play. In December, he went n for a surgery to remove an avm from brain. Was told it was a routine surgery and was told that he needs to get it out. Was told it would be a short recovery and he would be back to work. 1 1/2 hour surgery turned into 6 hour surgery and he had complications. He had extra cerebral fluid and Dr. York Spaniel it would absorb and just wait. He kept declining and by March, they went in to try and drain the extra fluid. He was in hospital for a week. Wife noticed that during the hospital stay, his speech declined and he was clearly weak. Nurses were trying to discharge him and she protested, but they sent him home anyway. He declined, and could not sit, stand, talk or walk and she ended up calling ambulance. In ER, she ws told he had a stroke. He was in ER 24 hours before getting a room in Neuro area. Head of stroke team came in and said it was not a stroke, but complications from surgery. She was blamed by medical team for not "speaking up" before being initially discharged. He ended up in a rehab for 2 weeks and told them he needed another surgery. Was told he had hydrocephalus and need a shunt to drain liquid off his brain. She felt a lot of pressure  from team that he needed surgery. She wanted a second opinion and had rouble getting it. Spoke to another Dr. and they decided to get the third surgery. He was in OT,PT and Speech Therapy. During therapy, he started having a tremor. They ended up being referred to Dr. Arbutus Leas, who has now told him that treating his tremor is tricky and will take time.  Quamaine is no longer in OT,PT speech therapy. He is supposed to be doing exercises on his own, but has not been emotionally able to ge himself to do it. He has persistent headaches as well. Just had a CAT Scan and awaiting the results. His headaches are worse and they are concerned. He also has rhumatory arthritis and it is flaring up because he had to stop meds. He has cardiomyopathy, macular degeneration  on top of all of these other conditions.  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 He has been approved for disability and are working to get social security disability. She works part time. They have 2 daughters, one with diabetes and arthritis. She is heading to [employer] this Fall and wants to work in Research scientist (physical sciences). Other daughter is 22 and is "our introvert with lots of anxiety". She is in therapy and has mental health issues that is being treated. She will be a Holiday representative at the middle college at [employer].  They are told that medicine will not control his condition, but they will have to try to retrain his brain. Suggested that they work to get back into OT and PT. She has a hard time motivating him. He admits that he keeps things "inside". She says he is not a good comunicator. He will not share things like if he is in pain or hurts himself.   Tx. Plan/Goals: Patient reports depressive symptoms secondary to the medical trauma he has suffered and the debilitating physical results. He is seeking therapy to resolve his feelings of helplessness and hopelessness. Also want assistance in improving his  drive/motivation in daily activity. Will engage in therapy that utilizes behavioral approaches to mitigate symptoms along with insight oriented counseling. Will also incorporate some conjoint sessions with spouse as needed, given her integral involvement with his care. Patient agrees with plan for treatment. Goal Date is 12-25    Patient agrees to be seen via video Public affairs consultant) and understands the limits of the platform. He is at home and provider in his home office.  Session Note:  Patient states that not much has changed and he is watching a lot of sports on TV. I asked about his goals for the future and he said it is all impaired by his ability (or lack of ability). He says it is hard to say. He wants to avoid putting anyone out due to his "disability". He acknowledges that he has a long way to go to accept his limitations. He talked about all of his athletic and business accomplishments.                                                              Garrel Ridgel, PhD 4:10p-5:00 50 minutes

## 2023-07-15 ENCOUNTER — Ambulatory Visit: Payer: BC Managed Care – PPO | Admitting: Psychology

## 2023-07-15 ENCOUNTER — Encounter: Payer: Self-pay | Admitting: Physical Medicine and Rehabilitation

## 2023-07-15 DIAGNOSIS — F321 Major depressive disorder, single episode, moderate: Secondary | ICD-10-CM

## 2023-07-15 NOTE — Progress Notes (Signed)
 [facility] Counselor Initial Adult Exam  Name: Christopher Yu Date: 07/15/2023 MRN: [id number] DOB: 08-23-1960 PCP: Thana Ates, MD    Guardian/Payee:  N/A    Paperwork requested: No   Reason for Visit Christopher Yu Problem: Adjustment to serious medical illness  Mental Status Exam: Appearance:   Casual     Behavior:  Appropriate  Motor:  Shuffling Gait  Speech/Language:   Slurred  Affect:  Depressed  Mood:  depressed  Thought process:  normal  Thought content:    WNL  Sensory/Perceptual disturbances:    WNL  Orientation:  oriented to person, place, and situation  Attention:  Good  Concentration:  Good  Memory:  WNL  Fund of knowledge:   Good  Insight:    Good  Judgment:   unknown  Impulse Control:  unknown    Reported Symptoms:  Sadness, helplessness and hopelessness  Risk Assessment: Danger to Self:  No Self-injurious Behavior: No Danger to Others: No Duty to Warn:no Physical Aggression / Violence:No  Access to Firearms a concern:  Unknown Gang Involvement:No  Patient / guardian was educated about steps to take if suicide or homicide risk level increases between visits: n/a While future psychiatric events cannot be accurately predicted, the patient does not currently require acute inpatient psychiatric care and does not  currently meet [facility] involuntary commitment criteria.  Substance Abuse History: Current substance abuse: No     Past Psychiatric History:   No previous psychological problems have been observed Outpatient Providers:N/A History of Psych Hospitalization: No  Psychological Testing:  none    Abuse History:  Victim of:   N/A   Report  needed: No. Victim of Neglect:No. Perpetrator of  N/A   Witness / Exposure to Domestic Violence: No   Protective Services Involvement: No  Witness to [employer] Violence:  No   Family History:  Family History  Problem Relation Age of Onset   Other Father        MVA   Lupus Sister     Living situation: the patient lives with their family  Sexual Orientation: Straight  Relationship Status: married  Name of spouse Christopher Yu If a parent, number of children / ages:Two girls, ages 78 and 10  Support Systems: spouse  Surveyor, quantity Stress:  Yes   Income/Employment/Disability: Software engineer:  unknown  Educational History: Education:  unknown  Oncologist: unknown  Any cultural differences that may affect / interfere with treatment:  not applicable   Recreation/Hobbies: golfing  Stressors: Financial difficulties   Health problems    Strengths: Family  Barriers:  severe physical limitations that compromises activity   Legal History: Pending legal issue / charges: The patient has no significant history of legal issues. History of legal issue / charges:  N/A  Medical History/Surgical History: reviewed Past Medical History:  Diagnosis Date   Anxiety    Arthritis, rheumatoid (HCC)    dx 1988   CAD (coronary artery disease), native coronary artery    Mild LAD disease with calcification noted at cath 12//27/16    Cardiomyopathy (HCC)    Cardiomyopathy (HCC)    Chronic systolic heart failure (HCC) 05/01/2015   Depression    Deviated nasal septum 06/25/2011   Headache    High cholesterol     Hyperlipidemia    Impingement syndrome of left shoulder 12/22/2017   Macular degeneration    Nausea and vomiting 08/28/2022   Rheumatoid aortitis    Sleep apnea    does not use cpap - UNABLE TO TOLERATE MASK   Sleep apnea in adult    deviated septum repaired, most recent sleep study was negative   Status post total right knee replacement 06/01/2015    Past Surgical History:  Procedure Laterality Date   ANKLE FUSION Right 05/05/2012   related to his arthritis   ANKLE FUSION Left 05/25/2013   related to his arthritis   ANKLE SURGERY Bilateral    APPLICATION OF CRANIAL NAVIGATION N/A 04/11/2022   Procedure: APPLICATION OF CRANIAL NAVIGATION;  Surgeon: Lisbeth Renshaw, MD;  Location: [facility] OR;  Service: Neurosurgery;  Laterality: N/A;   APPLICATION OF CRANIAL NAVIGATION N/A 08/08/2022   Procedure: APPLICATION OF CRANIAL NAVIGATION;  Surgeon: Lisbeth Renshaw, MD;  Location: [facility] OR;  Service: Neurosurgery;  Laterality: N/A;   BIOPSY  06/09/2023   Procedure: BIOPSY;  Surgeon: Kerin Salen, MD;  Location: [facility] ENDOS[facility];  Service: Gastroenterology;;   CARDIAC CATHETERIZATION N/A 05/01/2015   Procedure: Left Heart Cath and Coronary Angiography;  Surgeon: Lyn Records, MD;  Location: [facility] INVASIVE CV LAB;  Service: Cardiovascular;  Laterality: N/A;   COLONOSCOPY WITH PROPOFOL N/A 06/09/2023   Procedure: COLONOSCOPY WITH PROPOFOL;  Surgeon: Kerin Salen, MD;  Location: [facility];  Service: Gastroenterology;  Laterality: N/A;   CRANIOTOMY N/A 04/11/2022   Procedure: STEREOTACTIC SUBOCCIPITAL CRANIOTOMY FOR RESECTION OF ARTERIO-VENOUS MALFORMATION;  Surgeon: Lisbeth Renshaw, MD;  Location: [facility];  Service: Neurosurgery;  Laterality: N/A;   ESOPHAGOGASTRODUODENOSCOPY (EGD) WITH PROPOFOL N/A 06/09/2023   Procedure: ESOPHAGOGASTRODUODENOSCOPY (EGD) WITH PROPOFOL;  Surgeon: Kerin Salen, MD;  Location: [facility];  Service: Gastroenterology;  Laterality: N/A;   FRACTURE SURGERY  left femur fracture  x 3   HEMOSTASIS CLIP PLACEMENT  06/09/2023   Procedure: HEMOSTASIS CLIP PLACEMENT;  Surgeon: Kerin Salen, MD;  Location: [facility];  Service: Gastroenterology;;   IR ANGIO EXTERNAL CAROTID SEL EXT CAROTID BILAT MOD SED  01/28/2022   IR ANGIO INTRA EXTRACRAN SEL INTERNAL CAROTID BILAT MOD SED  01/28/2022   IR ANGIO INTRA EXTRACRAN SEL INTERNAL CAROTID BILAT MOD SED  04/16/2022   IR ANGIO VERTEBRAL SEL VERTEBRAL BILAT MOD SED  01/28/2022   IR ANGIO VERTEBRAL SEL VERTEBRAL BILAT MOD SED  04/16/2022   KNEE ARTHROSCOPY     right x 4   LAPAROSCOPIC REVISION VENTRICULAR-PERITONEAL (V-P) SHUNT N/A 08/08/2022   Procedure: LAPAROSCOPIC VENTRICULAR-PERITONEAL (V-P) SHUNT;  Surgeon: Harriette Bouillon, MD;  Location: [facility];  Service: General;  Laterality: N/A;   LUMBAR LAMINECTOMY/DECOMPRESSION MICRODISCECTOMY N/A 07/18/2022   Procedure: Repair of Pseudomeningiocele posterior;  Surgeon: Lisbeth Renshaw, MD;  Location: [facility];  Service: Neurosurgery;  Laterality: N/A;   NASAL SEPTOPLASTY W/ TURBINOPLASTY  06/25/2011   Procedure: NASAL SEPTOPLASTY WITH TURBINATE REDUCTION;  Surgeon: Osborn Coho, MD;  Location[facility];  Service: ENT;  Laterality: Bilateral;   PLACEMENT OF LUMBAR DRAIN N/A 07/18/2022   Procedure: PLACEMENT OF LUMBAR DRAIN;  Surgeon: Lisbeth Renshaw, MD;  Location: [facility];  Service: Neurosurgery;  Laterality: N/A;   POLYPECTOMY  06/09/2023   Procedure: POLYPECTOMY;  Surgeon: Kerin Salen, MD;  Location: [facility];  Service: Gastroenterology;;   TONSILLECTOMY     TOTAL KNEE ARTHROPLASTY Right 06/01/2015   Procedure: RIGHT TOTAL KNEE ARTHROPLASTY;  Surgeon: Kathryne Hitch, MD;  Location: [facility];  Service: Orthopedics;  Laterality: Right;  Block+general   VENTRICULOPERITONEAL SHUNT Right 08/08/2022   Procedure: LAP ASSITED SHUNT INSERTION VENTRICULAR-PERITONEAL;  Surgeon: Lisbeth Renshaw, MD;  Location: [facility];  Service: Neurosurgery;  Laterality: Right;    Medications: Current  Outpatient Medications  Medication Sig Dispense Refill   acetaminophen (TYLENOL) 325 MG tablet Take 2 tablets (650 mg total) by mouth 3 (three) times daily.     alendronate (FOSAMAX) 10 MG tablet Take 10 mg by mouth daily before breakfast. Take with a full glass of water on an empty stomach.     artificial tears (LACRILUBE) OINT ophthalmic ointment Place into both eyes 2 (two) times daily.     aspirin EC 81 MG tablet Take 1 tablet (81 mg total) by mouth daily. Swallow whole. 30 tablet 12   calcium carbonate (TUMS - DOSED IN MG ELEMENTAL CALCIUM) 500 MG chewable tablet Chew 1 tablet (200 mg of elemental calcium total) by mouth daily with supper.     Cholecalciferol (VITAMIN D3) 50 MCG (2000 UT) TABS Take 2,000 Units by mouth daily.     famotidine (PEPCID) 40 MG tablet 1 tablet Orally Once a day for 30 days     folic acid (FOLVITE) 1 MG tablet Take 1 mg by mouth daily.     methotrexate (RHEUMATREX) 2.5 MG tablet Take 20 mg by mouth once a week. Takes 8 tablets(20mg ) every Saturday.     Caution:Chemotherapy. Protect from light.     metoprolol succinate (TOPROL-XL) 25 MG 24 hr tablet TAKE 1 TABLET DAILY 90 tablet 3   Multiple Vitamins-Minerals (PRESERVISION AREDS 2 PO) Take 2 capsules by mouth daily.     predniSONE (STERAPRED UNI-PAK 21 TAB) 5 MG (21) TBPK tablet Take 5 mg by mouth every morning.     RITUXAN 500 MG/50ML injection See admin instructions. Every 6 months 2 dose infusion Ruxience  rosuvastatin (CRESTOR) 20 MG tablet TAKE 1 TABLET DAILY (DOSE INCREASE) 90 tablet 2   sertraline (ZOLOFT) 20 MG/ML concentrated solution Take 25 mg by mouth daily.     spironolactone (ALDACTONE) 25 MG tablet 12.5 mg. 1/2 tab daily     sulfaSALAzine (AZULFIDINE) 500 MG tablet Take 1,000 mg by mouth 2 (two) times daily.     traMADol (ULTRAM) 50 MG tablet Take 1 tablet (50 mg total) by mouth every 12 (twelve) hours as needed for moderate pain. (Patient taking differently: Take 50 mg by mouth as needed for  moderate pain (pain score 4-6).) 60 tablet 2   traZODone (DESYREL) 50 MG tablet TAKE 1.5 TABLETS (75 MG TOTAL) BY MOUTH AT BEDTIME AS NEEDED. FOR SLEEP 135 tablet 5   White Petrolatum-Mineral Oil (GENTEAL TEARS NIGHT-TIME OP) Apply to eye at bedtime.     No current facility-administered medications for this visit.    Allergies  Allergen Reactions   Hydroxychloroquine     Other Reaction(s): cardiolmyopathy    Diagnoses:  Major depression  Plan of Care: Outpatient psychotherapy and medication management as needed. Initial Note: (With wife Christopher Yu). She says that she has hoped Cai would get into therapy. He has Ataxic Tremor. He told Dr. Arbutus Leas that he would like medication to just sit and do nothing. He says he always had golf to rey on and now cannot play. In December, he went n for a surgery to remove an avm from brain. Was told it was a routine surgery and was told that he needs to get it out. Was told it would be a short recovery and he would be back to work. 1 1/2 hour surgery turned into 6 hour surgery and he had complications. He had extra cerebral fluid and Dr. York Spaniel it would absorb and just wait. He kept declining and by March, they went in to try and drain the extra fluid. He was in hospital for a week. Wife noticed that during the hospital stay, his speech declined and he was clearly weak. Nurses were trying to discharge him and she protested, but they sent him home anyway. He declined, and could not sit, stand, talk or walk and she ended up calling ambulance. In ER, she ws told he had a stroke. He was in ER 24 hours before getting a room in Neuro area. Head of stroke team came in and said it was not a stroke, but complications from surgery. She was blamed by medical team for not "speaking up" before being initially discharged. He ended up in a rehab for 2 weeks and told them he needed another surgery. Was told he had hydrocephalus and need a shunt to drain liquid off his brain. She felt a lot  of pressure from team that he needed surgery. She wanted a second opinion and had rouble getting it. Spoke to another Dr. and they decided to get the third surgery. He was in OT,PT and Speech Therapy. During therapy, he started having a tremor. They ended up being referred to Dr. Arbutus Leas, who has now told him that treating his tremor is tricky and will take time.  Jacy is no longer in OT,PT speech therapy. He is supposed to be doing exercises on his own, but has not been emotionally able to ge himself to do it. He has persistent headaches as well. Just had a CAT Scan and awaiting the results. His headaches are worse and they are concerned. He also has rhumatory arthritis and it is flaring up  because he had to stop meds. He has cardiomyopathy, macular degeneration on top of all of these other conditions.  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 He has been approved for disability and are working to get social security disability. She works part time. They have 2 daughters, one with diabetes and arthritis. She is heading to [employer] this Fall and wants to work in Research scientist (physical sciences). Other daughter is 77 and is "our introvert with lots of anxiety". She is in therapy and has mental health issues that is being treated. She will be a Holiday representative at the middle college at [employer].  They are told that medicine will not control his condition, but they will have to try to retrain his brain. Suggested that they work to get back into OT and PT. She has a hard time motivating him. He admits that he keeps things "inside". She says he is not a good comunicator. He will not share things like if he is in pain or hurts himself.   Tx. Plan/Goals: Patient reports depressive symptoms secondary to the medical trauma he has suffered and the debilitating physical results. He is seeking therapy to resolve his feelings of helplessness and hopelessness. Also want assistance in  improving his drive/motivation in daily activity. Will engage in therapy that utilizes behavioral approaches to mitigate symptoms along with insight oriented counseling. Will also incorporate some conjoint sessions with spouse as needed, given her integral involvement with his care. Patient agrees with plan for treatment. Goal Date is 12-25    Patient agrees to be seen via video Public affairs consultant) and understands the limits of the platform. He is at home and provider in his home office.  Session Note:  Patient states he is "good" and was hitting golf balls. He was pleased with how he hit the ball. He also started to read 2 books. Hr still says he needs a lot of work on his game to get back to playing. He is starting to exercise walk much more and is pleased with increased stamina. We talked about building on these activities to bolster self. He says he struggles with not making decisions where in the past, he made all the decisions. He says he was the primary bread winner, would do the taxes and all major purchases. He says now his wife takes on those decisions. He hasn't shared with her that he wants to participate because he doesn't want to "get in trouble". I encouraged him to talk with wife about those tasks and his desire to participate. Explored ways he can talk with his wife about how they can collaborate.                                                                Garrel Ridgel, PhD 4:10p-5:00 50 minutes

## 2023-07-15 NOTE — Telephone Encounter (Signed)
 I don't have anything to add here; he needs to make a records request as you stated. I don't know if insurance can request them directly. Thank you.

## 2023-07-17 DIAGNOSIS — H353131 Nonexudative age-related macular degeneration, bilateral, early dry stage: Secondary | ICD-10-CM | POA: Diagnosis not present

## 2023-07-17 DIAGNOSIS — H04123 Dry eye syndrome of bilateral lacrimal glands: Secondary | ICD-10-CM | POA: Diagnosis not present

## 2023-07-17 DIAGNOSIS — H524 Presbyopia: Secondary | ICD-10-CM | POA: Diagnosis not present

## 2023-07-22 ENCOUNTER — Ambulatory Visit: Payer: BC Managed Care – PPO | Admitting: Psychology

## 2023-07-22 DIAGNOSIS — F321 Major depressive disorder, single episode, moderate: Secondary | ICD-10-CM

## 2023-07-22 NOTE — Progress Notes (Signed)
 [facility] Counselor Initial Adult Exam  Name: Christopher Yu Date: 07/22/2023 MRN: [id number] DOB: Dec 25, 1960 PCP: Thana Ates, MD    Guardian/Payee:  N/A    Paperwork requested: No   Reason for Visit [name] Problem: Adjustment to serious medical illness  Mental Status Exam: Appearance:   Casual     Behavior:  Appropriate  Motor:  Shuffling Gait  Speech/Language:   Slurred  Affect:  Depressed  Mood:  depressed  Thought process:  normal  Thought content:    WNL  Sensory/Perceptual disturbances:    WNL  Orientation:  oriented to person, place, and situation  Attention:  Good  Concentration:  Good  Memory:  WNL  Fund of knowledge:   Good  Insight:    Good  Judgment:   unknown  Impulse Control:  unknown    Reported Symptoms:  Sadness, helplessness and hopelessness  Risk Assessment: Danger to Self:  No Self-injurious Behavior: No Danger to Others: No Duty to Warn:no Physical Aggression / Violence:No  Access to Firearms a concern:  Unknown Gang Involvement:No  Patient / guardian was educated about steps to take if suicide or homicide risk level increases between visits: n/a While future psychiatric events cannot be accurately predicted, the patient does not currently require acute inpatient  psychiatric care and does not currently meet [facility] involuntary commitment criteria.  Substance Abuse History: Current substance abuse: No     Past Psychiatric History:   No previous psychological problems have been observed Outpatient Providers:N/A History of Psych Hospitalization: No  Psychological Testing:  none    Abuse History:  Victim of:   N/A   Report needed: No. Victim of Neglect:No. Perpetrator of  N/A   Witness / Exposure to Domestic Violence: No   Protective Services Involvement: No  Witness to [employer] Violence:  No   Family History:  Family History  Problem Relation Age of Onset   Other Father        MVA   Lupus Sister     Living situation: the patient lives with their family  Sexual Orientation: Straight  Relationship Status: married  Name of spouse Christopher Yu If a parent, number of children / ages:Two girls, ages 4 and 61  Support Systems: spouse  Surveyor, quantity Stress:  Yes   Income/Employment/Disability: Software engineer:  unknown  Educational History: Education:  unknown  Oncologist: unknown  Any cultural differences that may affect / interfere with treatment:  not applicable   Recreation/Hobbies: golfing  Stressors: Financial difficulties   Health problems    Strengths: Family  Barriers:  severe physical limitations that compromises activity   Legal History: Pending legal issue / charges: The patient has no significant history of legal issues. History of legal issue / charges:  N/A  Medical History/Surgical History: reviewed Past Medical History:  Diagnosis Date   Anxiety    Arthritis, rheumatoid (HCC)    dx 1988   CAD (coronary artery disease), native coronary artery    Mild LAD disease with calcification noted at cath 12//27/16    Cardiomyopathy (HCC)    Cardiomyopathy (HCC)    Chronic systolic heart failure (HCC) 05/01/2015   Depression    Deviated nasal septum 06/25/2011    Headache    High cholesterol    Hyperlipidemia    Impingement syndrome of left shoulder 12/22/2017   Macular degeneration    Nausea and vomiting 08/28/2022   Rheumatoid aortitis    Sleep apnea    does not use cpap - UNABLE TO TOLERATE MASK   Sleep apnea in adult    deviated septum repaired, most recent sleep study was negative   Status post total right knee replacement 06/01/2015    Past Surgical History:  Procedure Laterality Date   ANKLE FUSION Right 05/05/2012   related to his arthritis   ANKLE FUSION Left 05/25/2013   related to his arthritis   ANKLE SURGERY Bilateral    APPLICATION OF CRANIAL NAVIGATION N/A 04/11/2022   Procedure: APPLICATION OF CRANIAL NAVIGATION;  Surgeon: Lisbeth Renshaw, MD;  Location: [facility] OR;  Service: Neurosurgery;  Laterality: N/A;   APPLICATION OF CRANIAL NAVIGATION N/A 08/08/2022   Procedure: APPLICATION OF CRANIAL NAVIGATION;  Surgeon: Lisbeth Renshaw, MD;  Location: [facility] OR;  Service: Neurosurgery;  Laterality: N/A;   BIOPSY  06/09/2023   Procedure: BIOPSY;  Surgeon: Kerin Salen, MD;  Location: [facility] ENDOSCOPY;  Service: Gastroenterology;;   CARDIAC CATHETERIZATION N/A 05/01/2015   Procedure: Left Heart Cath and Coronary Angiography;  Surgeon: Lyn Records, MD;  Location: [facility] INVASIVE CV LAB;  Service: Cardiovascular;  Laterality: N/A;   COLONOSCOPY WITH PROPOFOL N/A 06/09/2023   Procedure: COLONOSCOPY WITH PROPOFOL;  Surgeon: Kerin Salen, MD;  Location: [facility] END[facility];  Service: Gastroenterology;  Laterality: N/A;   CRANIOTOMY N/A 04/11/2022   Procedure: STEREOTACTIC SUBOCCIPITAL CRANIOTOMY FOR RESECTION OF ARTERIO-VENOUS MALFORMATION;  Surgeon: Lisbeth Renshaw, MD;  Location: [facility] OR;  Service: Neurosurgery;  Laterality: N/A;   ESOPHAGOGASTRODUODENOSCOPY (EGD) WITH PROPOFOL N/A 06/09/2023   Procedure: ESOPHAGOGASTRODUODENOSCOPY (EGD) WITH PROPOFOL;  Surgeon: Kerin Salen, MD;  Location: [facility];  Service: Gastroenterology;  Laterality: N/A;   FRACTURE  SURGERY     left femur fracture x 3   HEMOSTASIS CLIP PLACEMENT  06/09/2023   Procedure: HEMOSTASIS CLIP PLACEMENT;  Surgeon: Kerin Salen, MD;  Location: [facility];  Service: Gastroenterology;;   IR ANGIO EXTERNAL CAROTID SEL EXT CAROTID BILAT MOD SED  01/28/2022   IR ANGIO INTRA EXTRACRAN SEL INTERNAL CAROTID BILAT MOD SED  01/28/2022   IR ANGIO INTRA EXTRACRAN SEL INTERNAL CAROTID BILAT MOD SED  04/16/2022   IR ANGIO VERTEBRAL SEL VERTEBRAL BILAT MOD SED  01/28/2022   IR ANGIO VERTEBRAL SEL VERTEBRAL BILAT MOD SED  04/16/2022   KNEE ARTHROSCOPY     right x 4   LAPAROSCOPIC REVISION VENTRICULAR-PERITONEAL (V-P) SHUNT N/A 08/08/2022   Procedure: LAPAROSCOPIC VENTRICULAR-PERITONEAL (V-P) SHUNT;  Surgeon: Harriette Bouillon, MD;  Location: [facility];  Service: General;  Laterality: N/A;   LUMBAR LAMINECTOMY/DECOMPRESSION MICRODISCECTOMY N/A 07/18/2022   Procedure: Repair of Pseudomeningiocele posterior;  Surgeon: Lisbeth Renshaw, MD;  Location: [facility];  Service: Neurosurgery;  Laterality: N/A;   NASAL SEPTOPLASTY W/ TURBINOPLASTY  06/25/2011   Procedure: NASAL SEPTOPLASTY WITH TURBINATE REDUCTION;  Surgeon: Osborn Coho, MD;  Location: [facility] OR;  Service: ENT;  Laterality: Bilateral;   PLACEMENT OF LUMBAR DRAIN N/A 07/18/2022   Procedure: PLACEMENT OF LUMBAR DRAIN;  Surgeon: Lisbeth Renshaw, MD;  Location: [facility];  Service: Neurosurgery;  Laterality: N/A;   POLYPECTOMY  06/09/2023   Procedure: POLYPECTOMY;  Surgeon: Kerin Salen, MD;  Location: [facility];  Service: Gastroenterology;;   TONSILLECTOMY     TOTAL KNEE ARTHROPLASTY Right 06/01/2015   Procedure: RIGHT TOTAL KNEE ARTHROPLASTY;  Surgeon: Kathryne Hitch, MD;  Location: [facility] ORS;  Service: Orthopedics;  Laterality: Right;  Block+general   VENTRICULOPERITONEAL SHUNT Right 08/08/2022   Procedure: LAP ASSITED SHUNT INSERTION VENTRICULAR-PERITONEAL;  Surgeon: Lisbeth Renshaw, MD;  Location: [facility] OR;  Service: Neurosurgery;  Laterality:  Right;    Medications: Current Outpatient Medications  Medication Sig Dispense Refill   acetaminophen (TYLENOL) 325 MG tablet Take 2 tablets (650 mg total) by mouth 3 (three) times daily.     alendronate (FOSAMAX) 10 MG tablet Take 10 mg by mouth daily before breakfast. Take with a full glass of water on an empty stomach.     artificial tears (LACRILUBE) OINT ophthalmic ointment Place into both eyes 2 (two) times daily.     aspirin EC 81 MG tablet Take 1 tablet (81 mg total) by mouth daily. Swallow whole. 30 tablet 12   calcium carbonate (TUMS - DOSED IN MG ELEMENTAL CALCIUM) 500 MG chewable tablet Chew 1 tablet (200 mg of elemental calcium total) by mouth daily with supper.     Cholecalciferol (VITAMIN D3) 50 MCG (2000 UT) TABS Take 2,000 Units by mouth daily.     famotidine (PEPCID) 40 MG tablet 1 tablet Orally Once a day for 30 days     folic acid (FOLVITE) 1 MG tablet Take 1 mg by mouth daily.     methotrexate (RHEUMATREX) 2.5 MG tablet Take 20 mg by mouth once a week. Takes 8 tablets(20mg ) every Saturday.     Caution:Chemotherapy. Protect from light.     metoprolol succinate (TOPROL-XL) 25 MG 24 hr tablet TAKE 1 TABLET DAILY 90 tablet 3   Multiple Vitamins-Minerals (PRESERVISION AREDS 2 PO) Take 2 capsules by mouth daily.     predniSONE (STERAPRED UNI-PAK 21 TAB) 5 MG (21) TBPK tablet Take 5 mg by mouth every morning.  RITUXAN 500 MG/50ML injection See admin instructions. Every 6 months 2 dose infusion Ruxience     rosuvastatin (CRESTOR) 20 MG tablet TAKE 1 TABLET DAILY (DOSE INCREASE) 90 tablet 2   sertraline (ZOLOFT) 20 MG/ML concentrated solution Take 25 mg by mouth daily.     spironolactone (ALDACTONE) 25 MG tablet 12.5 mg. 1/2 tab daily     sulfaSALAzine (AZULFIDINE) 500 MG tablet Take 1,000 mg by mouth 2 (two) times daily.     traMADol (ULTRAM) 50 MG tablet Take 1 tablet (50 mg total) by mouth every 12 (twelve) hours as needed for moderate pain. (Patient taking differently: Take  50 mg by mouth as needed for moderate pain (pain score 4-6).) 60 tablet 2   traZODone (DESYREL) 50 MG tablet TAKE 1.5 TABLETS (75 MG TOTAL) BY MOUTH AT BEDTIME AS NEEDED. FOR SLEEP 135 tablet 5   White Petrolatum-Mineral Oil (GENTEAL TEARS NIGHT-TIME OP) Apply to eye at bedtime.     No current facility-administered medications for this visit.    Allergies  Allergen Reactions   Hydroxychloroquine     Other Reaction(s): cardiolmyopathy    Diagnoses:  Major depression  Plan of Care: Outpatient psychotherapy and medication management as needed. Initial Note: (With wife Christopher Yu). She says that she has hoped Becket would get into therapy. He has Ataxic Tremor. He told Dr. Arbutus Leas that he would like medication to just sit and do nothing. He says he always had golf to rey on and now cannot play. In December, he went n for a surgery to remove an avm from brain. Was told it was a routine surgery and was told that he needs to get it out. Was told it would be a short recovery and he would be back to work. 1 1/2 hour surgery turned into 6 hour surgery and he had complications. He had extra cerebral fluid and Dr. York Spaniel it would absorb and just wait. He kept declining and by March, they went in to try and drain the extra fluid. He was in hospital for a week. Wife noticed that during the hospital stay, his speech declined and he was clearly weak. Nurses were trying to discharge him and she protested, but they sent him home anyway. He declined, and could not sit, stand, talk or walk and she ended up calling ambulance. In ER, she ws told he had a stroke. He was in ER 24 hours before getting a room in Neuro area. Head of stroke team came in and said it was not a stroke, but complications from surgery. She was blamed by medical team for not "speaking up" before being initially discharged. He ended up in a rehab for 2 weeks and told them he needed another surgery. Was told he had hydrocephalus and need a shunt to drain liquid  off his brain. She felt a lot of pressure from team that he needed surgery. She wanted a second opinion and had rouble getting it. Spoke to another Dr. and they decided to get the third surgery. He was in OT,PT and Speech Therapy. During therapy, he started having a tremor. They ended up being referred to Dr. Arbutus Leas, who has now told him that treating his tremor is tricky and will take time.  [place] is no longer in OT,PT speech therapy. He is supposed to be doing exercises on his own, but has not been emotionally able to ge himself to do it. He has persistent headaches as well. Just had a CAT Scan and awaiting the results.  His headaches are worse and they are concerned. He also has rhumatory arthritis and it is flaring up because he had to stop meds. He has cardiomyopathy, macular degeneration on top of all of these other conditions.  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 He has been approved for disability and are working to get social security disability. She works part time. They have 2 daughters, one with diabetes and arthritis. She is heading to [employer] this Fall and wants to work in Research scientist (physical sciences). Other daughter is 19 and is "our introvert with lots of anxiety". She is in therapy and has mental health issues that is being treated. She will be a Holiday representative at the middle college at [employer].  They are told that medicine will not control his condition, but they will have to try to retrain his brain. Suggested that they work to get back into OT and PT. She has a hard time motivating him. He admits that he keeps things "inside". She says he is not a good comunicator. He will not share things like if he is in pain or hurts himself.   Tx. Plan/Goals: Patient reports depressive symptoms secondary to the medical trauma he has suffered and the debilitating physical results. He is seeking therapy to resolve his feelings of helplessness and hopelessness.  Also want assistance in improving his drive/motivation in daily activity. Will engage in therapy that utilizes behavioral approaches to mitigate symptoms along with insight oriented counseling. Will also incorporate some conjoint sessions with spouse as needed, given her integral involvement with his care. Patient agrees with plan for treatment. Goal Date is 12-25    Patient agrees to be seen via video Public affairs consultant) and understands the limits of the platform. He is at home and provider in his home office.  Session Note:  Patient states he has been taking walks with the help of his walking stick. He has not yet talked to wife about decision making. Doesn't feel prepared to have that discussion. We talked about whether he should accept limitations (particularly with golf) or be frustrated in attempting to approve. Suggested that he is better off accepting the frustration in an effort to achieve a goal. He is thinking that he will, but expresses skepticism.                                                                  Garrel Ridgel, PhD 4:15p-5:00 45 minutes

## 2023-07-29 ENCOUNTER — Telehealth: Payer: Self-pay | Admitting: Physical Medicine and Rehabilitation

## 2023-07-29 ENCOUNTER — Ambulatory Visit (INDEPENDENT_AMBULATORY_CARE_PROVIDER_SITE_OTHER): Payer: BC Managed Care – PPO | Admitting: Psychology

## 2023-07-29 DIAGNOSIS — F329 Major depressive disorder, single episode, unspecified: Secondary | ICD-10-CM

## 2023-07-29 DIAGNOSIS — F321 Major depressive disorder, single episode, moderate: Secondary | ICD-10-CM

## 2023-07-29 NOTE — Telephone Encounter (Signed)
 Referral was sent to her 07/01/23; see telephone message

## 2023-07-29 NOTE — Telephone Encounter (Signed)
 [name] patient's wife called in , states during visit was told patient will be sent back to see speech therapist Clinton Sawyer at [facility] .

## 2023-07-29 NOTE — Progress Notes (Signed)
 [facility] Counselor Initial Adult Exam  Name: Christopher Yu Date: 07/29/2023 MRN: [id number] DOB: 1960/09/29 PCP: Thana Ates, MD    Guardian/Payee:  N/A    Paperwork requested: No   Reason for Visit Christopher Yu Problem: Adjustment to serious medical illness  Mental Status Exam: Appearance:   Casual     Behavior:  Appropriate  Motor:  Shuffling Gait  Speech/Language:   Slurred  Affect:  Depressed  Mood:  depressed  Thought process:  normal  Thought content:    WNL  Sensory/Perceptual disturbances:    WNL  Orientation:  oriented to person, place, and situation  Attention:  Good  Concentration:  Good  Memory:  WNL  Fund of knowledge:   Good  Insight:    Good  Judgment:   unknown  Impulse Control:  unknown    Reported Symptoms:  Sadness, helplessness and hopelessness  Risk Assessment: Danger to Self:  No Self-injurious Behavior: No Danger to Others: No Duty to Warn:no Physical Aggression / Violence:No  Access to Firearms a concern:  Unknown Gang Involvement:No  Patient / guardian was educated about steps to take if suicide or homicide risk level increases between visits: n/a While future psychiatric events cannot be accurately predicted, the patient does not  currently require acute inpatient psychiatric care and does not currently meet [facility] involuntary commitment criteria.  Substance Abuse History: Current substance abuse: No     Past Psychiatric History:   No previous psychological  problems have been observed Outpatient Providers:N/A History of Psych Hospitalization: No  Psychological Testing:  none    Abuse History:  Victim of:   N/A   Report needed: No. Victim of Neglect:No. Perpetrator of  N/A   Witness / Exposure to Domestic Violence: No   Protective Services Involvement: No  Witness to [employer] Violence:  No   Family History:  Family History  Problem Relation Age of Onset   Other Father        MVA   Lupus Sister     Living situation: the patient lives with their family  Sexual Orientation: Straight  Relationship Status: married  Name of spouse [name] If a parent, number of children / ages:Two girls, ages 61 and 4  Support Systems: spouse  Surveyor, quantity Stress:  Yes   Income/Employment/Disability: Software engineer:  unknown  Educational History: Education:  unknown  Oncologist: unknown  Any cultural differences that may affect / interfere with treatment:  not applicable   Recreation/Hobbies: golfing  Stressors: Financial difficulties   Health problems    Strengths: Family  Barriers:  severe physical limitations that compromises activity   Legal History: Pending legal issue / charges: The patient has no significant history of legal issues. History of legal issue / charges:  N/A  Medical History/Surgical History: reviewed Past Medical History:  Diagnosis Date   Anxiety    Arthritis, rheumatoid (HCC)    dx 1988   CAD (coronary artery disease), native coronary artery    Mild LAD disease with calcification noted at cath 12//27/16    Cardiomyopathy (HCC)    Cardiomyopathy (HCC)    Chronic systolic heart failure (HCC) 05/01/2015   Depression     Deviated nasal septum 06/25/2011   Headache    High cholesterol    Hyperlipidemia    Impingement syndrome of left shoulder 12/22/2017   Macular degeneration    Nausea and vomiting 08/28/2022   Rheumatoid aortitis    Sleep apnea    does not use cpap - UNABLE TO TOLERATE MASK   Sleep apnea in adult    deviated septum repaired, most recent sleep study was negative   Status post total right knee replacement 06/01/2015    Past Surgical History:  Procedure Laterality Date   ANKLE FUSION Right 05/05/2012   related to his arthritis   ANKLE FUSION Left 05/25/2013   related to his arthritis   ANKLE SURGERY Bilateral    APPLICATION OF CRANIAL NAVIGATION N/A 04/11/2022   Procedure: APPLICATION OF CRANIAL NAVIGATION;  Surgeon: Lisbeth Renshaw, MD;  Location: [facility] OR;  Service: Neurosurgery;  Laterality: N/A;   APPLICATION OF CRANIAL NAVIGATION N/A 08/08/2022   Procedure: APPLICATION OF CRANIAL NAVIGATION;  Surgeon: Lisbeth Renshaw, MD;  Location: [facility] OR;  Service: Neurosurgery;  Laterality: N/A;   BIOPSY  06/09/2023   Procedure: BIOPSY;  Surgeon: Kerin Salen, MD;  Location: [facility];  Service: Gastroenterology;;   CARDIAC CATHETERIZATION N/A 05/01/2015   Procedure: Left Heart Cath and Coronary Angiography;  Surgeon: Lyn Records, MD;  Location: [facility] INVASIVE CV LAB;  Service: Cardiovascular;  Laterality: N/A;   COLONOSCOPY WITH PROPOFOL N/A 06/09/2023   Procedure: COLONOSCOPY WITH PROPOFOL;  Surgeon: Kerin Salen, MD;  Location: [facility];  Service: Gastroenterology;  Laterality: N/A;   CRANIOTOMY N/A 04/11/2022   Procedure: STEREOTACTIC SUBOCCIPITAL CRANIOTOMY FOR RESECTION OF ARTERIO-VENOUS MALFORMATION;  Surgeon: Lisbeth Renshaw, MD;  Location: [facility] OR;  Service: Neurosurgery;  Laterality: N/A;   ESOPHAGOGASTRODUODENOSCOPY (EGD) WITH PROPOFOL N/A 06/09/2023  Procedure: ESOPHAGOGASTRODUODENOSCOPY (EGD) WITH PROPOFOL;  Surgeon: Kerin Salen, MD;  Location: [facility];  Service:  Gastroenterology;  Laterality: N/A;   FRACTURE SURGERY     left femur fracture x 3   HEMOSTASIS CLIP PLACEMENT  06/09/2023   Procedure: HEMOSTASIS CLIP PLACEMENT;  Surgeon: Kerin Salen, MD;  Location: [facility];  Service: Gastroenterology;;   IR ANGIO EXTERNAL CAROTID SEL EXT CAROTID BILAT MOD SED  01/28/2022   IR ANGIO INTRA EXTRACRAN SEL INTERNAL CAROTID BILAT MOD SED  01/28/2022   IR ANGIO INTRA EXTRACRAN SEL INTERNAL CAROTID BILAT MOD SED  04/16/2022   IR ANGIO VERTEBRAL SEL VERTEBRAL BILAT MOD SED  01/28/2022   IR ANGIO VERTEBRAL SEL VERTEBRAL BILAT MOD SED  04/16/2022   KNEE ARTHROSCOPY     right x 4   LAPAROSCOPIC REVISION VENTRICULAR-PERITONEAL (V-P) SHUNT N/A 08/08/2022   Procedure: LAPAROSCOPIC VENTRICULAR-PERITONEAL (V-P) SHUNT;  Surgeon: Harriette Bouillon, MD;  Location: [facility];  Service: General;  Laterality: N/A;   LUMBAR LAMINECTOMY/DECOMPRESSION MICRODISCECTOMY N/A 07/18/2022   Procedure: Repair of Pseudomeningiocele posterior;  Surgeon: Lisbeth Renshaw, MD;  Location: [facility];  Service: Neurosurgery;  Laterality: N/A;   NASAL SEPTOPLASTY W/ TURBINOPLASTY  06/25/2011   Procedure: NASAL SEPTOPLASTY WITH TURBINATE REDUCTION;  Surgeon: Osborn Coho, MD;  Location: [facility] OR;  Service: ENT;  Laterality: Bilateral;   PLACEMENT OF LUMBAR DRAIN N/A 07/18/2022   Procedure: PLACEMENT OF LUMBAR DRAIN;  Surgeon: Lisbeth Renshaw, MD;  Location: [facility] OR;  Service: Neurosurgery;  Laterality: N/A;   POLYPECTOMY  06/09/2023   Procedure: POLYPECTOMY;  Surgeon: Kerin Salen, MD;  Location: [facility];  Service: Gastroenterology;;   TONSILLECTOMY     TOTAL KNEE ARTHROPLASTY Right 06/01/2015   Procedure: RIGHT TOTAL KNEE ARTHROPLASTY;  Surgeon: Kathryne Hitch, MD;  Location: [facility];  Service: Orthopedics;  Laterality: Right;  Block+general   VENTRICULOPERITONEAL SHUNT Right 08/08/2022   Procedure: LAP ASSITED SHUNT INSERTION VENTRICULAR-PERITONEAL;  Surgeon: Lisbeth Renshaw, MD;  Location:  [facility] OR;  Service: Neurosurgery;  Laterality: Right;    Medications: Current Outpatient Medications  Medication Sig Dispense Refill   acetaminophen (TYLENOL) 325 MG tablet Take 2 tablets (650 mg total) by mouth 3 (three) times daily.     alendronate (FOSAMAX) 10 MG tablet Take 10 mg by mouth daily before breakfast. Take with a full glass of water on an empty stomach.     artificial tears (LACRILUBE) OINT ophthalmic ointment Place into both eyes 2 (two) times daily.     aspirin EC 81 MG tablet Take 1 tablet (81 mg total) by mouth daily. Swallow whole. 30 tablet 12   calcium carbonate (TUMS - DOSED IN MG ELEMENTAL CALCIUM) 500 MG chewable tablet Chew 1 tablet (200 mg of elemental calcium total) by mouth daily with supper.     Cholecalciferol (VITAMIN D3) 50 MCG (2000 UT) TABS Take 2,000 Units by mouth daily.     famotidine (PEPCID) 40 MG tablet 1 tablet Orally Once a day for 30 days     folic acid (FOLVITE) 1 MG tablet Take 1 mg by mouth daily.     methotrexate (RHEUMATREX) 2.5 MG tablet Take 20 mg by mouth once a week. Takes 8 tablets(20mg ) every Saturday.     Caution:Chemotherapy. Protect from light.     metoprolol succinate (TOPROL-XL) 25 MG 24 hr tablet TAKE 1 TABLET DAILY 90 tablet 3   Multiple Vitamins-Minerals (PRESERVISION AREDS 2 PO) Take 2 capsules by mouth daily.     predniSONE (STERAPRED UNI-PAK 21 TAB) 5 MG (  21) TBPK tablet Take 5 mg by mouth every morning.     RITUXAN 500 MG/50ML injection See admin instructions. Every 6 months 2 dose infusion Ruxience     rosuvastatin (CRESTOR) 20 MG tablet TAKE 1 TABLET DAILY (DOSE INCREASE) 90 tablet 2   sertraline (ZOLOFT) 20 MG/ML concentrated solution Take 25 mg by mouth daily.     spironolactone (ALDACTONE) 25 MG tablet 12.5 mg. 1/2 tab daily     sulfaSALAzine (AZULFIDINE) 500 MG tablet Take 1,000 mg by mouth 2 (two) times daily.     traMADol (ULTRAM) 50 MG tablet Take 1 tablet (50 mg total) by mouth every 12 (twelve) hours as needed for  moderate pain. (Patient taking differently: Take 50 mg by mouth as needed for moderate pain (pain score 4-6).) 60 tablet 2   traZODone (DESYREL) 50 MG tablet TAKE 1.5 TABLETS (75 MG TOTAL) BY MOUTH AT BEDTIME AS NEEDED. FOR SLEEP 135 tablet 5   White Petrolatum-Mineral Oil (GENTEAL TEARS NIGHT-TIME OP) Apply to eye at bedtime.     No current facility-administered medications for this visit.    Allergies  Allergen Reactions   Hydroxychloroquine     Other Reaction(s): cardiolmyopathy    Diagnoses:  Major depression  Plan of Care: Outpatient psychotherapy and medication management as needed. Initial Note: (With wife [name]). She says that she has hoped Sheron would get into therapy. He has Ataxic Tremor. He told Dr. Arbutus Leas that he would like medication to just sit and do nothing. He says he always had golf to rey on and now cannot play. In December, he went n for a surgery to remove an avm from brain. Was told it was a routine surgery and was told that he needs to get it out. Was told it would be a short recovery and he would be back to work. 1 1/2 hour surgery turned into 6 hour surgery and he had complications. He had extra cerebral fluid and Dr. York Spaniel it would absorb and just wait. He kept declining and by March, they went in to try and drain the extra fluid. He was in hospital for a week. Wife noticed that during the hospital stay, his speech declined and he was clearly weak. Nurses were trying to discharge him and she protested, but they sent him home anyway. He declined, and could not sit, stand, talk or walk and she ended up calling ambulance. In ER, she ws told he had a stroke. He was in ER 24 hours before getting a room in Neuro area. Head of stroke team came in and said it was not a stroke, but complications from surgery. She was blamed by medical team for not "speaking up" before being initially discharged. He ended up in a rehab for 2 weeks and told them he needed another surgery. Was told he had  hydrocephalus and need a shunt to drain liquid off his brain. She felt a lot of pressure from team that he needed surgery. She wanted a second opinion and had rouble getting it. Spoke to another Dr. and they decided to get the third surgery. He was in OT,PT and Speech Therapy. During therapy, he started having a tremor. They ended up being referred to Dr. Arbutus Leas, who has now told him that treating his tremor is tricky and will take time.  Ronie is no longer in OT,PT speech therapy. He is supposed to be doing exercises on his own, but has not been emotionally able to ge himself to do it. He  has persistent headaches as well. Just had a CAT Scan and awaiting the results. His headaches are worse and they are concerned. He also has rhumatory arthritis and it is flaring up because he had to stop meds. He has cardiomyopathy, macular degeneration on top of all of these other conditions.  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 He has been approved for disability and are working to get social security disability. She works part time. They have 2 daughters, one with diabetes and arthritis. She is heading to [employer] this Fall and wants to work in Research scientist (physical sciences). Other daughter is 77 and is "our introvert with lots of anxiety". She is in therapy and has mental health issues that is being treated. She will be a Holiday representative at the middle college at [employer].  They are told that medicine will not control his condition, but they will have to try to retrain his brain. Suggested that they work to get back into OT and PT. She has a hard time motivating him. He admits that he keeps things "inside". She says he is not a good comunicator. He will not share things like if he is in pain or hurts himself.   Tx. Plan/Goals: Patient reports depressive symptoms secondary to the medical trauma he has suffered and the debilitating physical results. He is seeking therapy to resolve  his feelings of helplessness and hopelessness. Also want assistance in improving his drive/motivation in daily activity. Will engage in therapy that utilizes behavioral approaches to mitigate symptoms along with insight oriented counseling. Will also incorporate some conjoint sessions with spouse as needed, given her integral involvement with his care. Patient agrees with plan for treatment. Goal Date is 12-25    Patient agrees to be seen in the provider's office.  Session Note:  Patient states he has been doing his exercise walking. Says he has been feeing a little better lately and is "doing what he can". He is watching news, reading books and says it is often boring. He is also working on a garden project at home.  I asked him what else, other than golf, would he find stimulating and exciting. He says that he is faced getting over the hurdle of getting back to walking and having steady balance. He has a hard time committing to work on very specific physical weaknesses. Tearful as he says his biggest challenge is not knowing the timeframe it will take to improve or to get to a reasonable place in recovery.                                                                       Garrel Ridgel, PhD 4:15p-5:00 45 minutes

## 2023-08-03 DIAGNOSIS — M79643 Pain in unspecified hand: Secondary | ICD-10-CM | POA: Diagnosis not present

## 2023-08-03 DIAGNOSIS — I429 Cardiomyopathy, unspecified: Secondary | ICD-10-CM | POA: Diagnosis not present

## 2023-08-03 DIAGNOSIS — M0579 Rheumatoid arthritis with rheumatoid factor of multiple sites without organ or systems involvement: Secondary | ICD-10-CM | POA: Diagnosis not present

## 2023-08-03 DIAGNOSIS — Z79899 Other long term (current) drug therapy: Secondary | ICD-10-CM | POA: Diagnosis not present

## 2023-08-03 DIAGNOSIS — M810 Age-related osteoporosis without current pathological fracture: Secondary | ICD-10-CM | POA: Diagnosis not present

## 2023-08-05 ENCOUNTER — Ambulatory Visit (INDEPENDENT_AMBULATORY_CARE_PROVIDER_SITE_OTHER): Payer: BC Managed Care – PPO | Admitting: Psychology

## 2023-08-05 DIAGNOSIS — F321 Major depressive disorder, single episode, moderate: Secondary | ICD-10-CM

## 2023-08-05 NOTE — Progress Notes (Signed)
 [facility] Counselor Initial Adult Exam  Name: Christopher Yu Date: 08/05/2023 MRN: [id number] DOB: 1960/11/27 PCP: Thana Ates, MD    Guardian/Payee:  N/A    Paperwork requested: No   Reason for Visit Christopher Yu Problem: Adjustment to serious medical illness  Mental Status Exam: Appearance:   Casual     Behavior:  Appropriate  Motor:  Shuffling Gait  Speech/Language:   Slurred  Affect:  Depressed  Mood:  depressed  Thought process:  normal  Thought content:    WNL  Sensory/Perceptual disturbances:    WNL  Orientation:  oriented to person, place, and situation  Attention:  Good  Concentration:  Good  Memory:  WNL  Fund of knowledge:   Good  Insight:    Good  Judgment:   unknown  Impulse Control:  unknown    Reported Symptoms:  Sadness, helplessness and hopelessness  Risk Assessment: Danger to Self:  No Self-injurious Behavior: No Danger to Others: No Duty to Warn:no Physical Aggression / Violence:No  Access to Firearms a concern:  Unknown Gang Involvement:No  Patient / guardian was educated about steps to take if suicide or homicide risk level increases between visits: n/a While future psychiatric events cannot be accurately predicted,  the patient does not currently require acute inpatient psychiatric care and does not currently meet [facility] involuntary commitment criteria.  Substance Abuse History: Current  substance abuse: No     Past Psychiatric History:   No previous psychological problems have been observed Outpatient Providers:N/A History of Psych Hospitalization: No  Psychological Testing:  none    Abuse History:  Victim of:   N/A   Report needed: No. Victim of Neglect:No. Perpetrator of  N/A   Witness / Exposure to Domestic Violence: No   Protective Services Involvement: No  Witness to [employer] Violence:  No   Family History:  Family History  Problem Relation Age of Onset   Other Father        MVA   Lupus Sister     Living situation: the patient lives with their family  Sexual Orientation: Straight  Relationship Status: married  Name of spouse [name] If a parent, number of children / ages:Two girls, ages 103 and 67  Support Systems: spouse  Surveyor, quantity Stress:  Yes   Income/Employment/Disability: Software engineer:  unknown  Educational History: Education:  unknown  Oncologist: unknown  Any cultural differences that may affect / interfere with treatment:  not applicable   Recreation/Hobbies: golfing  Stressors: Financial difficulties   Health problems    Strengths: Family  Barriers:  severe physical limitations that compromises activity   Legal History: Pending legal issue / charges: The patient has no significant history of legal issues. History of legal issue / charges:  N/A  Medical History/Surgical History: reviewed Past Medical History:  Diagnosis Date   Anxiety    Arthritis, rheumatoid (HCC)    dx 1988   CAD (coronary artery disease), native coronary artery    Mild LAD disease with calcification noted at cath 12//27/16    Cardiomyopathy (HCC)    Cardiomyopathy (HCC)    Chronic systolic heart failure (HCC) 05/01/2015    Depression    Deviated nasal septum 06/25/2011   Headache    High cholesterol    Hyperlipidemia    Impingement syndrome of left shoulder 12/22/2017   Macular degeneration    Nausea and vomiting 08/28/2022   Rheumatoid aortitis    Sleep apnea    does not use cpap - UNABLE TO TOLERATE MASK   Sleep apnea in adult    deviated septum repaired, most recent sleep study was negative   Status post total right knee replacement 06/01/2015    Past Surgical History:  Procedure Laterality Date   ANKLE FUSION Right 05/05/2012   related to his arthritis   ANKLE FUSION Left 05/25/2013   related to his arthritis   ANKLE SURGERY Bilateral    APPLICATION OF CRANIAL NAVIGATION N/A 04/11/2022   Procedure: APPLICATION OF CRANIAL NAVIGATION;  Surgeon: Lisbeth Renshaw, MD;  Location: [facility] OR;  Service: Neurosurgery;  Laterality: N/A;   APPLICATION OF CRANIAL NAVIGATION N/A 08/08/2022   Procedure: APPLICATION OF CRANIAL NAVIGATION;  Surgeon: Lisbeth Renshaw, MD;  Location: [facility] OR;  Service: Neurosurgery;  Laterality: N/A;   BIOPSY  06/09/2023   Procedure: BIOPSY;  Surgeon: Kerin Salen, MD;  Location: [facility];  Service: Gastroenterology;;   CARDIAC CATHETERIZATION N/A 05/01/2015   Procedure: Left Heart Cath and Coronary Angiography;  Surgeon: Lyn Records, MD;  Location: [facility] INVASIVE CV LAB;  Service: Cardiovascular;  Laterality: N/A;   COLONOSCOPY WITH PROPOFOL N/A 06/09/2023   Procedure: COLONOSCOPY WITH PROPOFOL;  Surgeon: Kerin Salen, MD;  Location: [facility];  Service: Gastroenterology;  Laterality: N/A;   CRANIOTOMY N/A 04/11/2022   Procedure: STEREOTACTIC SUBOCCIPITAL CRANIOTOMY FOR RESECTION OF ARTERIO-VENOUS MALFORMATION;  Surgeon: Lisbeth Renshaw, MD;  Location: [facility]  OR;  Service: Neurosurgery;  Laterality: N/A;   ESOPHAGOGASTRODUODENOSCOPY (EGD) WITH PROPOFOL N/A 06/09/2023   Procedure: ESOPHAGOGASTRODUODENOSCOPY (EGD) WITH PROPOFOL;  Surgeon: Kerin Salen, MD;  Location: [facility];   Service: Gastroenterology;  Laterality: N/A;   FRACTURE SURGERY     left femur fracture x 3   HEMOSTASIS CLIP PLACEMENT  06/09/2023   Procedure: HEMOSTASIS CLIP PLACEMENT;  Surgeon: Kerin Salen, MD;  Location: [facility];  Service: Gastroenterology;;   IR ANGIO EXTERNAL CAROTID SEL EXT CAROTID BILAT MOD SED  01/28/2022   IR ANGIO INTRA EXTRACRAN SEL INTERNAL CAROTID BILAT MOD SED  01/28/2022   IR ANGIO INTRA EXTRACRAN SEL INTERNAL CAROTID BILAT MOD SED  04/16/2022   IR ANGIO VERTEBRAL SEL VERTEBRAL BILAT MOD SED  01/28/2022   IR ANGIO VERTEBRAL SEL VERTEBRAL BILAT MOD SED  04/16/2022   KNEE ARTHROSCOPY     right x 4   LAPAROSCOPIC REVISION VENTRICULAR-PERITONEAL (V-P) SHUNT N/A 08/08/2022   Procedure: LAPAROSCOPIC VENTRICULAR-PERITONEAL (V-P) SHUNT;  Surgeon: Harriette Bouillon, MD;  Location: [facility];  Service: General;  Laterality: N/A;   LUMBAR LAMINECTOMY/DECOMPRESSION MICRODISCECTOMY N/A 07/18/2022   Procedure: Repair of Pseudomeningiocele posterior;  Surgeon: Lisbeth Renshaw, MD;  Location: [facility];  Service: Neurosurgery;  Laterality: N/A;   NASAL SEPTOPLASTY W/ TURBINOPLASTY  06/25/2011   Procedure: NASAL SEPTOPLASTY WITH TURBINATE REDUCTION;  Surgeon: Osborn Coho, MD;  Location: [facility] OR;  Service: ENT;  Laterality: Bilateral;   PLACEMENT OF LUMBAR DRAIN N/A 07/18/2022   Procedure: PLACEMENT OF LUMBAR DRAIN;  Surgeon: Lisbeth Renshaw, MD;  Location: [facility];  Service: Neurosurgery;  Laterality: N/A;   POLYPECTOMY  06/09/2023   Procedure: POLYPECTOMY;  Surgeon: Kerin Salen, MD;  Location: [facility];  Service: Gastroenterology;;   TONSILLECTOMY     TOTAL KNEE ARTHROPLASTY Right 06/01/2015   Procedure: RIGHT TOTAL KNEE ARTHROPLASTY;  Surgeon: Kathryne Hitch, MD;  Location: [facility];  Service: Orthopedics;  Laterality: Right;  Block+general   VENTRICULOPERITONEAL SHUNT Right 08/08/2022   Procedure: LAP ASSITED SHUNT INSERTION VENTRICULAR-PERITONEAL;  Surgeon: Lisbeth Renshaw, MD;   Location: [facility] OR;  Service: Neurosurgery;  Laterality: Right;    Medications: Current Outpatient Medications  Medication Sig Dispense Refill   acetaminophen (TYLENOL) 325 MG tablet Take 2 tablets (650 mg total) by mouth 3 (three) times daily.     alendronate (FOSAMAX) 10 MG tablet Take 10 mg by mouth daily before breakfast. Take with a full glass of water on an empty stomach.     artificial tears (LACRILUBE) OINT ophthalmic ointment Place into both eyes 2 (two) times daily.     aspirin EC 81 MG tablet Take 1 tablet (81 mg total) by mouth daily. Swallow whole. 30 tablet 12   calcium carbonate (TUMS - DOSED IN MG ELEMENTAL CALCIUM) 500 MG chewable tablet Chew 1 tablet (200 mg of elemental calcium total) by mouth daily with supper.     Cholecalciferol (VITAMIN D3) 50 MCG (2000 UT) TABS Take 2,000 Units by mouth daily.     famotidine (PEPCID) 40 MG tablet 1 tablet Orally Once a day for 30 days     folic acid (FOLVITE) 1 MG tablet Take 1 mg by mouth daily.     methotrexate (RHEUMATREX) 2.5 MG tablet Take 20 mg by mouth once a week. Takes 8 tablets(20mg ) every Saturday.     Caution:Chemotherapy. Protect from light.     metoprolol succinate (TOPROL-XL) 25 MG 24 hr tablet TAKE 1 TABLET DAILY 90 tablet 3   Multiple Vitamins-Minerals (PRESERVISION AREDS 2 PO)  Take 2 capsules by mouth daily.     predniSONE (STERAPRED UNI-PAK 21 TAB) 5 MG (21) TBPK tablet Take 5 mg by mouth every morning.     RITUXAN 500 MG/50ML injection See admin instructions. Every 6 months 2 dose infusion Ruxience     rosuvastatin (CRESTOR) 20 MG tablet TAKE 1 TABLET DAILY (DOSE INCREASE) 90 tablet 2   sertraline (ZOLOFT) 20 MG/ML concentrated solution Take 25 mg by mouth daily.     spironolactone (ALDACTONE) 25 MG tablet 12.5 mg. 1/2 tab daily     sulfaSALAzine (AZULFIDINE) 500 MG tablet Take 1,000 mg by mouth 2 (two) times daily.     traMADol (ULTRAM) 50 MG tablet Take 1 tablet (50 mg total) by mouth every 12 (twelve) hours as  needed for moderate pain. (Patient taking differently: Take 50 mg by mouth as needed for moderate pain (pain score 4-6).) 60 tablet 2   traZODone (DESYREL) 50 MG tablet TAKE 1.5 TABLETS (75 MG TOTAL) BY MOUTH AT BEDTIME AS NEEDED. FOR SLEEP 135 tablet 5   White Petrolatum-Mineral Oil (GENTEAL TEARS NIGHT-TIME OP) Apply to eye at bedtime.     No current facility-administered medications for this visit.    Allergies  Allergen Reactions   Hydroxychloroquine     Other Reaction(s): cardiolmyopathy    Diagnoses:  Major depression  Plan of Care: Outpatient psychotherapy and medication management as needed. Initial Note: (With wife [name]). She says that she has hoped Christopher Yu would get into therapy. He has Ataxic Tremor. He told Dr. Arbutus Leas that he would like medication to just sit and do nothing. He says he always had golf to rey on and now cannot play. In December, he went n for a surgery to remove an avm from brain. Was told it was a routine surgery and was told that he needs to get it out. Was told it would be a short recovery and he would be back to work. 1 1/2 hour surgery turned into 6 hour surgery and he had complications. He had extra cerebral fluid and Dr. York Spaniel it would absorb and just wait. He kept declining and by March, they went in to try and drain the extra fluid. He was in hospital for a week. Wife noticed that during the hospital stay, his speech declined and he was clearly weak. Nurses were trying to discharge him and she protested, but they sent him home anyway. He declined, and could not sit, stand, talk or walk and she ended up calling ambulance. In ER, she ws told he had a stroke. He was in ER 24 hours before getting a room in Neuro area. Head of stroke team came in and said it was not a stroke, but complications from surgery. She was blamed by medical team for not "speaking up" before being initially discharged. He ended up in a rehab for 2 weeks and told them he needed another surgery. Was  told he had hydrocephalus and need a shunt to drain liquid off his brain. She felt a lot of pressure from team that he needed surgery. She wanted a second opinion and had rouble getting it. Spoke to another Dr. and they decided to get the third surgery. He was in OT,PT and Speech Therapy. During therapy, he started having a tremor. They ended up being referred to Dr. Arbutus Leas, who has now told him that treating his tremor is tricky and will take time.  Javiel is no longer in OT,PT speech therapy. He is supposed to be doing  exercises on his own, but has not been emotionally able to ge himself to do it. He has persistent headaches as well. Just had a CAT Scan and awaiting the results. His headaches are worse and they are concerned. He also has rhumatory arthritis and it is flaring up because he had to stop meds. He has cardiomyopathy, macular degeneration on top of all of these other conditions.  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 He has been approved for disability and are working to get social security disability. She works part time. They have 2 daughters, one with diabetes and arthritis. She is heading to [employer] this Fall and wants to work in Research scientist (physical sciences). Other daughter is 4 and is "our introvert with lots of anxiety". She is in therapy and has mental health issues that is being treated. She will be a Holiday representative at the middle college at [employer].  They are told that medicine will not control his condition, but they will have to try to retrain his brain. Suggested that they work to get back into OT and PT. She has a hard time motivating him. He admits that he keeps things "inside". She says he is not a good comunicator. He will not share things like if he is in pain or hurts himself.   Tx. Plan/Goals: Patient reports depressive symptoms secondary to the medical trauma he has suffered and the debilitating physical results. He is seeking therapy  to resolve his feelings of helplessness and hopelessness. Also want assistance in improving his drive/motivation in daily activity. Will engage in therapy that utilizes behavioral approaches to mitigate symptoms along with insight oriented counseling. Will also incorporate some conjoint sessions with spouse as needed, given her integral involvement with his care. Patient agrees with plan for treatment. Goal Date is 12-25    Patient agrees to be seen in the provider's office.  Session Note:  Patient states he has been dealing with a variety of house issues/projects. He says that he has not had time to think about "non-important stuff". He saw rheumatologist on Monday. He goes every three months. On Prednisone daily (5mg ). He discussed parenting of his younger daughter at home. States she is more difficult in some ways than her older sister.                                                                             Garrel Ridgel, PhD 4:15p-5:00 45 minutes

## 2023-08-07 DIAGNOSIS — G4733 Obstructive sleep apnea (adult) (pediatric): Secondary | ICD-10-CM | POA: Diagnosis not present

## 2023-08-10 ENCOUNTER — Ambulatory Visit: Payer: BC Managed Care – PPO | Admitting: Physical Medicine and Rehabilitation

## 2023-08-12 ENCOUNTER — Ambulatory Visit: Payer: BC Managed Care – PPO | Admitting: Psychology

## 2023-08-12 ENCOUNTER — Telehealth: Payer: Self-pay | Admitting: Physical Medicine and Rehabilitation

## 2023-08-12 DIAGNOSIS — F321 Major depressive disorder, single episode, moderate: Secondary | ICD-10-CM | POA: Diagnosis not present

## 2023-08-12 DIAGNOSIS — I639 Cerebral infarction, unspecified: Secondary | ICD-10-CM

## 2023-08-12 DIAGNOSIS — R1310 Dysphagia, unspecified: Secondary | ICD-10-CM

## 2023-08-12 NOTE — Progress Notes (Signed)
 [facility] Counselor Initial Adult Exam  Name: Christopher Yu Date: 08/12/2023 MRN: [id number] DOB: [birth year] PCP: Christopher Ates, MD    Guardian/Payee:  N/A    Paperwork requested: No   Reason for Visit Christopher Yu Problem: Adjustment to serious medical illness  Mental Status Exam: Appearance:   Casual     Behavior:  Appropriate  Motor:  Shuffling Gait  Speech/Language:   Slurred  Affect:  Depressed  Mood:  depressed  Thought process:  normal  Thought content:    WNL  Sensory/Perceptual disturbances:    WNL  Orientation:  oriented to person, place, and situation  Attention:  Good  Concentration:  Good  Memory:  WNL  Fund of knowledge:   Good  Insight:    Good  Judgment:   unknown  Impulse Control:  unknown    Reported Symptoms:  Sadness, helplessness and hopelessness  Risk Assessment: Danger to Self:  No Self-injurious Behavior: No Danger to Others: No Duty to Warn:no Physical Aggression / Violence:No  Access to Firearms a concern:  Unknown Gang Involvement:No  Patient / guardian was educated about steps to take if suicide or homicide risk level increases between visits: n/a While future psychiatric events cannot be accurately predicted, the patient does not currently require acute inpatient psychiatric care and does not currently meet [facility] involuntary commitment criteria.  Substance Abuse History: Current substance abuse: No     Past Psychiatric History:   No previous psychological problems have been observed Outpatient Providers:N/A History of Psych Hospitalization: No  Psychological Testing:  none    Abuse History:  Victim of:   N/A   Report needed: No. Victim of Neglect:No. Perpetrator of  N/A   Witness / Exposure to Domestic Violence: No   Protective Services Involvement: No  Witness to [employer] Violence:  No   Family History:  Family History  Problem Relation Age of Onset   Other Father        MVA    Lupus Sister     Living situation: the patient lives with their family  Sexual Orientation: Straight  Relationship Status: married  Name of spouse Christopher Yu If a parent, number of children / ages:Two girls, ages 40 and 87  Support Systems: spouse  Surveyor, quantity Stress:  Yes   Income/Employment/Disability: Software engineer:  unknown  Educational History: Education:  unknown  Oncologist: unknown  Any cultural differences that may affect / interfere with treatment:  not applicable   Recreation/Hobbies: golfing  Stressors: Financial difficulties   Health problems    Strengths: Family  Barriers:  severe physical limitations that compromises activity   Legal History: Pending legal issue / charges: The patient has no significant history of legal issues. History of legal issue / charges:  N/A  Medical History/Surgical History: reviewed Past Medical History:  Diagnosis Date   Anxiety    Arthritis, rheumatoid (HCC)    dx 1988   CAD (coronary artery disease), native coronary artery    Mild LAD disease with calcification noted at cath 12//27/16    Cardiomyopathy (HCC)    Cardiomyopathy (HCC)    Chronic systolic heart failure (HCC) 05/01/2015   Depression    Deviated nasal septum 06/25/2011   Headache    High cholesterol    Hyperlipidemia    Impingement syndrome of left shoulder 12/22/2017   Macular degeneration    Nausea and vomiting 08/28/2022   Rheumatoid aortitis    Sleep apnea    does  not use cpap - UNABLE TO TOLERATE MASK   Sleep apnea in adult    deviated septum repaired, most recent sleep study was negative   Status post total right knee replacement 06/01/2015    Past Surgical History:  Procedure Laterality Date   ANKLE FUSION Right 05/05/2012   related to his arthritis   ANKLE FUSION Left 05/25/2013   related to his arthritis   ANKLE SURGERY Bilateral    APPLICATION OF CRANIAL NAVIGATION N/A 04/11/2022    Procedure: APPLICATION OF CRANIAL NAVIGATION;  Surgeon: Lisbeth Renshaw, MD;  Location: [facility];  Service: Neurosurgery;  Laterality: N/A;   APPLICATION OF CRANIAL NAVIGATION N/A 08/08/2022   Procedure: APPLICATION OF CRANIAL NAVIGATION;  Surgeon: Lisbeth Renshaw, MD;  Location: [facility] OR;  Service: Neurosurgery;  Laterality: N/A;   BIOPSY  06/09/2023   Procedure: BIOPSY;  Surgeon: Christopher Salen, MD;  Location: [facility];  Service: Gastroenterology;;   CARDIAC CATHETERIZATION N/A 05/01/2015   Procedure: Left Heart Cath and Coronary Angiography;  Surgeon: Christopher Records, MD;  Location: [facility] INVASIVE CV LAB;  Service: Cardiovascular;  Laterality: N/A;   COLONOSCOPY WITH PROPOFOL N/A 06/09/2023   Procedure: COLONOSCOPY WITH PROPOFOL;  Surgeon: Christopher Salen, MD;  Location: [facility];  Service: Gastroenterology;  Laterality: N/A;   CRANIOTOMY N/A 04/11/2022   Procedure: STEREOTACTIC SUBOCCIPITAL CRANIOTOMY FOR RESECTION OF ARTERIO-VENOUS MALFORMATION;  Surgeon: Lisbeth Renshaw, MD;  Location: [facility];  Service: Neurosurgery;  Laterality: N/A;   ESOPHAGOGASTRODUODENOSCOPY (EGD) WITH PROPOFOL N/A 06/09/2023   Procedure: ESOPHAGOGASTRODUODENOSCOPY (EGD) WITH PROPOFOL;  Surgeon: Christopher Salen, MD;  Location: [facility];  Service: Gastroenterology;  Laterality: N/A;   FRACTURE SURGERY     left femur fracture x 3   HEMOSTASIS CLIP PLACEMENT  06/09/2023   Procedure: HEMOSTASIS CLIP PLACEMENT;  Surgeon: Christopher Salen, MD;  Location: [facility];  Service: Gastroenterology;;   IR ANGIO EXTERNAL CAROTID SEL EXT CAROTID BILAT MOD SED  01/28/2022   IR ANGIO INTRA EXTRACRAN SEL INTERNAL CAROTID BILAT MOD SED  01/28/2022   IR ANGIO INTRA EXTRACRAN SEL INTERNAL CAROTID BILAT MOD SED  04/16/2022   IR ANGIO VERTEBRAL SEL VERTEBRAL BILAT MOD SED  01/28/2022   IR ANGIO VERTEBRAL SEL VERTEBRAL BILAT MOD SED  04/16/2022   KNEE ARTHROSCOPY     right x 4   LAPAROSCOPIC REVISION VENTRICULAR-PERITONEAL (V-P) SHUNT N/A 08/08/2022    Procedure: LAPAROSCOPIC VENTRICULAR-PERITONEAL (V-P) SHUNT;  Surgeon: Christopher Bouillon, MD;  [facility]: [facility];  Service: General;  Laterality: N/A;   LUMBAR LAMINECTOMY/DECOMPRESSION MICRODISCECTOMY N/A 07/18/2022   Procedure: Repair of Pseudomeningiocele posterior;  Surgeon: Lisbeth Renshaw, MD;  [facility]: [facility];  Service: Neurosurgery;  Laterality: N/A;   NASAL SEPTOPLASTY W/ TURBINOPLASTY  06/25/2011   Procedure: NASAL SEPTOPLASTY WITH TURBINATE REDUCTION;  Surgeon: Osborn Coho, MD;  Location: [facility] OR;  Service: ENT;  Laterality: Bilateral;   PLACEMENT OF LUMBAR DRAIN N/A 07/18/2022   Procedure: PLACEMENT OF LUMBAR DRAIN;  Surgeon: Lisbeth Renshaw, MD;  [facility];  Service: Neurosurgery;  Laterality: N/A;   POLYPECTOMY  06/09/2023   Procedure: POLYPECTOMY;  Surgeon: Christopher Salen, MD;  Location: [facility];  Service: Gastroenterology;;   TONSILLECTOMY     TOTAL KNEE ARTHROPLASTY Right 06/01/2015   Procedure: RIGHT TOTAL KNEE ARTHROPLASTY;  Surgeon: Kathryne Hitch, MD;  Location: [facility];  Service: Orthopedics;  Laterality: Right;  Block+general   VENTRICULOPERITONEAL SHUNT Right 08/08/2022   Procedure: LAP ASSITED SHUNT INSERTION VENTRICULAR-PERITONEAL;  Surgeon: Lisbeth Renshaw, MD;  Location: [facility];  Service: Neurosurgery;  Laterality: Right;    Medications: Current Outpatient Medications  Medication Sig Dispense Refill   acetaminophen (TYLENOL) 325 MG tablet Take 2 tablets (650 mg total) by mouth 3 (three) times daily.     alendronate (FOSAMAX) 10 MG tablet Take 10 mg by mouth daily before breakfast. Take with a full glass of water on an empty stomach.     artificial tears (LACRILUBE) OINT ophthalmic ointment Place into both eyes 2 (two) times daily.     aspirin EC 81 MG tablet Take 1 tablet (81 mg total) by mouth daily. Swallow whole. 30 tablet 12   calcium carbonate (TUMS - DOSED IN MG ELEMENTAL CALCIUM) 500 MG chewable tablet Chew 1 tablet (200 mg of elemental calcium  total) by mouth daily with supper.     Cholecalciferol (VITAMIN D3) 50 MCG (2000 UT) TABS Take 2,000 Units by mouth daily.     famotidine (PEPCID) 40 MG tablet 1 tablet Orally Once a day for 30 days     folic acid (FOLVITE) 1 MG tablet Take 1 mg by mouth daily.     methotrexate (RHEUMATREX) 2.5 MG tablet Take 20 mg by mouth once a week. Takes 8 tablets(20mg ) every Saturday.     Caution:Chemotherapy. Protect from light.     metoprolol succinate (TOPROL-XL) 25 MG 24 hr tablet TAKE 1 TABLET DAILY 90 tablet 3   Multiple Vitamins-Minerals (PRESERVISION AREDS 2 PO) Take 2 capsules by mouth daily.     predniSONE (STERAPRED UNI-PAK 21 TAB) 5 MG (21) TBPK tablet Take 5 mg by mouth every morning.     RITUXAN 500 MG/50ML injection See admin instructions. Every 6 months 2 dose infusion Ruxience     rosuvastatin (CRESTOR) 20 MG tablet TAKE 1 TABLET DAILY (DOSE INCREASE) 90 tablet 2   sertraline (ZOLOFT) 20 MG/ML concentrated solution Take 25 mg by mouth daily.     spironolactone (ALDACTONE) 25 MG tablet 12.5 mg. 1/2 tab daily     sulfaSALAzine (AZULFIDINE) 500 MG tablet Take 1,000 mg by mouth 2 (two) times daily.     traMADol (ULTRAM) 50 MG tablet Take 1 tablet (50 mg total) by mouth every 12 (twelve) hours as needed for moderate pain. (Patient taking differently: Take 50 mg by mouth as needed for moderate pain (pain score 4-6).) 60 tablet 2   traZODone (DESYREL) 50 MG tablet TAKE 1.5 TABLETS (75 MG TOTAL) BY MOUTH AT BEDTIME AS NEEDED. FOR SLEEP 135 tablet 5   White Petrolatum-Mineral Oil (GENTEAL TEARS NIGHT-TIME OP) Apply to eye at bedtime.     No current facility-administered medications for this visit.    Allergies  Allergen Reactions   Hydroxychloroquine     Other Reaction(s): cardiolmyopathy    Diagnoses:  Major depression  Plan of Care: Outpatient psychotherapy and medication management as needed. Initial Note: (With wife Christopher Yu). She says that she has hoped Kaelin would get into therapy. He  has Ataxic Tremor. He told Dr. Arbutus Leas that he would like medication to just sit and do nothing. He says he always had golf to rey on and now cannot play. In December, he went n for a surgery to remove an avm from brain. Was told it was a routine surgery and was told that he needs to get it out. Was told it would be a short recovery and he would be back to work. 1 1/2 hour surgery turned into 6 hour surgery and he had complications. He had extra cerebral fluid and Dr. York Spaniel it would absorb and just  wait. He kept declining and by March, they went in to try and drain the extra fluid. He was in hospital for a week. Wife noticed that during the hospital stay, his speech declined and he was clearly weak. Nurses were trying to discharge him and she protested, but they sent him home anyway. He declined, and could not sit, stand, talk or walk and she ended up calling ambulance. In ER, she ws told he had a stroke. He was in ER 24 hours before getting a room in Neuro area. Head of stroke team came in and said it was not a stroke, but complications from surgery. She was blamed by medical team for not "speaking up" before being initially discharged. He ended up in a rehab for 2 weeks and told them he needed another surgery. Was told he had hydrocephalus and need a shunt to drain liquid off his brain. She felt a lot of pressure from team that he needed surgery. She wanted a second opinion and had rouble getting it. Spoke to another Dr. and they decided to get the third surgery. He was in OT,PT and Speech Therapy. During therapy, he started having a tremor. They ended up being referred to Dr. Arbutus Leas, who has now told him that treating his tremor is tricky and will take time.  Rastus is no longer in OT,PT speech therapy. He is supposed to be doing exercises on his own, but has not been emotionally able to ge himself to do it. He has persistent headaches as well. Just had a CAT Scan and awaiting the results. His headaches are worse and  they are concerned. He also has rhumatory arthritis and it is flaring up because he had to stop meds. He has cardiomyopathy, macular degeneration on top of all of these other conditions.  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 He has been approved for disability and are working to get social security disability. She works part time. They have 2 daughters, one with diabetes and arthritis. She is heading to [employer] this Fall and wants to work in Research scientist (physical sciences). Other daughter is 4 and is "our introvert with lots of anxiety". She is in therapy and has mental health issues that is being treated. She will be a Holiday representative at the middle college at [employer].  They are told that medicine will not control his condition, but they will have to try to retrain his brain. Suggested that they work to get back into OT and PT. She has a hard time motivating him. He admits that he keeps things "inside". She says he is not a good comunicator. He will not share things like if he is in pain or hurts himself.   Tx. Plan/Goals: Patient reports depressive symptoms secondary to the medical trauma he has suffered and the debilitating physical results. He is seeking therapy to resolve his feelings of helplessness and hopelessness. Also want assistance in improving his drive/motivation in daily activity. Will engage in therapy that utilizes behavioral approaches to mitigate symptoms along with insight oriented counseling. Will also incorporate some conjoint sessions with spouse as needed, given her integral involvement with his care. Patient agrees with plan for treatment. Goal Date is 12-25    Patient agrees to be seen in video Public affairs consultant) session and he is aware of platform limitations. He is at home and provider in his office.  Session Note:  Patient states he has been busy  with various house chores. Daughters were home for spring break. Everyone did well, although  younger daughter picks on older sister. He is excited to watch the masters golf tournament, but says it does not get him excited about playing again. Claims it is "hard to get excited about doing something you know you are bad at doing". Says heisn't sure he has the energy to get back to golf.                                                                                Garrel Ridgel, PhD 4:15p-5:00 45 minutes

## 2023-08-12 NOTE — Telephone Encounter (Signed)
 Patients wife called in states she called [facility] and they have not received updated referral for speech , they informed patient that we need to send in a new referral order to be able to schedule w/ Clinton Sawyer .

## 2023-08-14 ENCOUNTER — Telehealth (HOSPITAL_COMMUNITY): Payer: Self-pay | Admitting: *Deleted

## 2023-08-14 NOTE — Telephone Encounter (Signed)
 Thank you :)

## 2023-08-14 NOTE — Telephone Encounter (Signed)
 Attempted to contact patient to schedule OP MBS. Left VM @ [phone]. RKEEL

## 2023-08-19 ENCOUNTER — Ambulatory Visit: Payer: BC Managed Care – PPO | Admitting: Psychology

## 2023-08-19 DIAGNOSIS — F329 Major depressive disorder, single episode, unspecified: Secondary | ICD-10-CM | POA: Diagnosis not present

## 2023-08-19 DIAGNOSIS — F321 Major depressive disorder, single episode, moderate: Secondary | ICD-10-CM

## 2023-08-19 NOTE — Progress Notes (Signed)
 [facility] Counselor Initial Adult Exam  Name: Christopher Yu Date: 08/19/2023 MRN: [id number] DOB: 01-17-1961 PCP: Thana Ates, MD    Guardian/Payee:  N/A    Paperwork requested: No   Reason for Visit Christopher Yu Problem: Adjustment to serious medical illness  Mental Status Exam: Appearance:   Casual     Behavior:  Appropriate  Motor:  Shuffling Gait  Speech/Language:   Slurred  Affect:  Depressed  Mood:  depressed  Thought process:  normal  Thought content:    WNL  Sensory/Perceptual disturbances:    WNL  Orientation:  oriented to person, place, and situation  Attention:  Good  Concentration:  Good  Memory:  WNL  Fund of knowledge:   Good  Insight:    Good  Judgment:   unknown  Impulse Control:  unknown    Reported Symptoms:  Sadness, helplessness and hopelessness  Risk Assessment: Danger to Self:  No Self-injurious Behavior: No Danger to Others: No Duty to Warn:no Physical Aggression / Violence:No  Access to Firearms a concern:  Unknown Gang Involvement:No  Patient / guardian was educated about steps to take if suicide or homicide risk level increases between visits: n/a While future psychiatric events cannot be accurately predicted, the patient does not currently require acute inpatient psychiatric care and does not currently meet [facility] involuntary commitment criteria.  Substance Abuse History: Current substance abuse: No     Past Psychiatric History:   No previous psychological problems have been observed Outpatient Providers:N/A History of Psych Hospitalization: No  Psychological Testing:  none    Abuse History:  Victim of:   N/A   Report needed: No. Victim of Neglect:No. Perpetrator of  N/A   Witness / Exposure to Domestic Violence: No   Protective Services Involvement: No  Witness to [employer] Violence:  No   Family History:  Family History  Problem Relation Age of  Onset   Other Father        MVA   Lupus Sister     Living situation: the patient lives with their family  Sexual Orientation: Straight  Relationship Status: married  Name of spouse [name] If a parent, number of children / ages:Two girls, ages 80 and 68  Support Systems: spouse  Surveyor, quantity Stress:  Yes   Income/Employment/Disability: Software engineer:  unknown  Educational History: Education:  unknown  Oncologist: unknown  Any cultural differences that may affect / interfere with treatment:  not applicable   Recreation/Hobbies: golfing  Stressors: Financial difficulties   Health problems    Strengths: Family  Barriers:  severe physical limitations that compromises activity   Legal History: Pending legal issue / charges: The patient has no significant history of legal issues. History of legal issue / charges:  N/A  Medical History/Surgical History: reviewed Past Medical History:  Diagnosis Date   Anxiety    Arthritis, rheumatoid (HCC)    dx 1988   CAD (coronary artery disease), native coronary artery    Mild LAD disease with calcification noted at cath 12//27/16    Cardiomyopathy (HCC)    Cardiomyopathy (HCC)    Chronic systolic heart failure (HCC) 05/01/2015   Depression    Deviated nasal septum 06/25/2011   Headache    High cholesterol    Hyperlipidemia    Impingement syndrome of left shoulder 12/22/2017   Macular degeneration    Nausea and  vomiting 08/28/2022   Rheumatoid aortitis    Sleep apnea    does not use cpap - UNABLE TO TOLERATE MASK   Sleep apnea in adult    deviated septum repaired, most recent sleep study was negative   Status post total right knee replacement 06/01/2015    Past Surgical History:  Procedure Laterality Date   ANKLE FUSION Right 05/05/2012   related to his arthritis   ANKLE FUSION Left 05/25/2013   related to his arthritis   ANKLE SURGERY Bilateral    APPLICATION OF CRANIAL  NAVIGATION N/A 04/11/2022   Procedure: APPLICATION OF CRANIAL NAVIGATION;  Surgeon: Augusto Blonder, MD;  Location: [facility] OR;  Service: Neurosurgery;  Laterality: N/A;   APPLICATION OF CRANIAL NAVIGATION N/A 08/08/2022   Procedure: APPLICATION OF CRANIAL NAVIGATION;  Surgeon: Augusto Blonder, MD;  Location: [facility] OR;  Service: Neurosurgery;  Laterality: N/A;   BIOPSY  06/09/2023   Procedure: BIOPSY;  Surgeon: Genell Ken, MD;  Location: [facility];  Service: Gastroenterology;;   CARDIAC CATHETERIZATION N/A 05/01/2015   Procedure: Left Heart Cath and Coronary Angiography;  Surgeon: Arty Binning, MD;  Location: [facility] INVASIVE CV LAB;  Service: Cardiovascular;  Laterality: N/A;   COLONOSCOPY WITH PROPOFOL N/A 06/09/2023   Procedure: COLONOSCOPY WITH PROPOFOL;  Surgeon: Genell Ken, MD;  Location: [facility];  Service: Gastroenterology;  Laterality: N/A;   CRANIOTOMY N/A 04/11/2022   Procedure: STEREOTACTIC SUBOCCIPITAL CRANIOTOMY FOR RESECTION OF ARTERIO-VENOUS MALFORMATION;  Surgeon: Augusto Blonder, MD;  Location: [facility];  Service: Neurosurgery;  Laterality: N/A;   ESOPHAGOGASTRODUODENOSCOPY (EGD) WITH PROPOFOL N/A 06/09/2023   Procedure: ESOPHAGOGASTRODUODENOSCOPY (EGD) WITH PROPOFOL;  Surgeon: Genell Ken, MD;  Location: [facility];  Service: Gastroenterology;  Laterality: N/A;   FRACTURE SURGERY     left femur fracture x 3   HEMOSTASIS CLIP PLACEMENT  06/09/2023   Procedure: HEMOSTASIS CLIP PLACEMENT;  Surgeon: Genell Ken, MD;  Location: [facility];  Service: Gastroenterology;;   IR ANGIO EXTERNAL CAROTID SEL EXT CAROTID BILAT MOD SED  01/28/2022   IR ANGIO INTRA EXTRACRAN SEL INTERNAL CAROTID BILAT MOD SED  01/28/2022   IR ANGIO INTRA EXTRACRAN SEL INTERNAL CAROTID BILAT MOD SED  04/16/2022   IR ANGIO VERTEBRAL SEL VERTEBRAL BILAT MOD SED  01/28/2022   IR ANGIO VERTEBRAL SEL VERTEBRAL BILAT MOD SED  04/16/2022   KNEE ARTHROSCOPY     right x 4   LAPAROSCOPIC REVISION VENTRICULAR-PERITONEAL  (V-P) SHUNT N/A 08/08/2022   Procedure: LAPAROSCOPIC VENTRICULAR-PERITONEAL (V-P) SHUNT;  Surgeon: Sim Dryer, MD;  Location: [facility];  Service: General;  Laterality: N/A;   LUMBAR LAMINECTOMY/DECOMPRESSION MICRODISCECTOMY N/A 07/18/2022   Procedure: Repair of Pseudomeningiocele posterior;  Surgeon: Augusto Blonder, MD;  Location: [facility];  Service: Neurosurgery;  Laterality: N/A;   NASAL SEPTOPLASTY W/ TURBINOPLASTY  06/25/2011   Procedure: NASAL SEPTOPLASTY WITH TURBINATE REDUCTION;  Surgeon: Ammon Bales, MD;  Location: [facility] OR;  Service: ENT;  Laterality: Bilateral;   PLACEMENT OF LUMBAR DRAIN N/A 07/18/2022   Procedure: PLACEMENT OF LUMBAR DRAIN;  Surgeon: Augusto Blonder, MD;  Location: [facility];  Service: Neurosurgery;  Laterality: N/A;   POLYPECTOMY  06/09/2023   Procedure: POLYPECTOMY;  Surgeon: Genell Ken, MD;  Location: [facility];  Service: Gastroenterology;;   TONSILLECTOMY     TOTAL KNEE ARTHROPLASTY Right 06/01/2015   Procedure: RIGHT TOTAL KNEE ARTHROPLASTY;  Surgeon: Arnie Lao, MD;  Location: [facility];  Service: Orthopedics;  Laterality: Right;  Block+general   VENTRICULOPERITONEAL SHUNT Right 08/08/2022   Procedure: LAP ASSITED  SHUNT INSERTION VENTRICULAR-PERITONEAL;  Surgeon: Lisbeth Renshaw, MD;  Location: [facility] OR;  Service: Neurosurgery;  Laterality: Right;    Medications: Current Outpatient Medications  Medication Sig Dispense Refill   acetaminophen (TYLENOL) 325 MG tablet Take 2 tablets (650 mg total) by mouth 3 (three) times daily.     alendronate (FOSAMAX) 10 MG tablet Take 10 mg by mouth daily before breakfast. Take with a full glass of water on an empty stomach.     artificial tears (LACRILUBE) OINT ophthalmic ointment Place into both eyes 2 (two) times daily.     aspirin EC 81 MG tablet Take 1 tablet (81 mg total) by mouth daily. Swallow whole. 30 tablet 12   calcium carbonate (TUMS - DOSED IN MG ELEMENTAL CALCIUM) 500 MG chewable tablet Chew 1 tablet  (200 mg of elemental calcium total) by mouth daily with supper.     Cholecalciferol (VITAMIN D3) 50 MCG (2000 UT) TABS Take 2,000 Units by mouth daily.     famotidine (PEPCID) 40 MG tablet 1 tablet Orally Once a day for 30 days     folic acid (FOLVITE) 1 MG tablet Take 1 mg by mouth daily.     methotrexate (RHEUMATREX) 2.5 MG tablet Take 20 mg by mouth once a week. Takes 8 tablets(20mg ) every Saturday.     Caution:Chemotherapy. Protect from light.     metoprolol succinate (TOPROL-XL) 25 MG 24 hr tablet TAKE 1 TABLET DAILY 90 tablet 3   Multiple Vitamins-Minerals (PRESERVISION AREDS 2 PO) Take 2 capsules by mouth daily.     predniSONE (STERAPRED UNI-PAK 21 TAB) 5 MG (21) TBPK tablet Take 5 mg by mouth every morning.     RITUXAN 500 MG/50ML injection See admin instructions. Every 6 months 2 dose infusion Ruxience     rosuvastatin (CRESTOR) 20 MG tablet TAKE 1 TABLET DAILY (DOSE INCREASE) 90 tablet 2   sertraline (ZOLOFT) 20 MG/ML concentrated solution Take 25 mg by mouth daily.     spironolactone (ALDACTONE) 25 MG tablet 12.5 mg. 1/2 tab daily     sulfaSALAzine (AZULFIDINE) 500 MG tablet Take 1,000 mg by mouth 2 (two) times daily.     traMADol (ULTRAM) 50 MG tablet Take 1 tablet (50 mg total) by mouth every 12 (twelve) hours as needed for moderate pain. (Patient taking differently: Take 50 mg by mouth as needed for moderate pain (pain score 4-6).) 60 tablet 2   traZODone (DESYREL) 50 MG tablet TAKE 1.5 TABLETS (75 MG TOTAL) BY MOUTH AT BEDTIME AS NEEDED. FOR SLEEP 135 tablet 5   White Petrolatum-Mineral Oil (GENTEAL TEARS NIGHT-TIME OP) Apply to eye at bedtime.     No current facility-administered medications for this visit.    Allergies  Allergen Reactions   Hydroxychloroquine     Other Reaction(s): cardiolmyopathy    Diagnoses:  Major depression  Plan of Care: Outpatient psychotherapy and medication management as needed. Initial Note: (With wife [name]). She says that she has hoped  Christopher Yu would get into therapy. He has Ataxic Tremor. He told Dr. Arbutus Leas that he would like medication to just sit and do nothing. He says he always had golf to rey on and now cannot play. In December, he went n for a surgery to remove an avm from brain. Was told it was a routine surgery and was told that he needs to get it out. Was told it would be a short recovery and he would be back to work. 1 1/2 hour surgery turned into 6 hour surgery and  he had complications. He had extra cerebral fluid and Dr. Arnetta Lank it would absorb and just wait. He kept declining and by March, they went in to try and drain the extra fluid. He was in hospital for a week. Wife noticed that during the hospital stay, his speech declined and he was clearly weak. Nurses were trying to discharge him and she protested, but they sent him home anyway. He declined, and could not sit, stand, talk or walk and she ended up calling ambulance. In ER, she ws told he had a stroke. He was in ER 24 hours before getting a room in Neuro area. Head of stroke team came in and said it was not a stroke, but complications from surgery. She was blamed by medical team for not "speaking up" before being initially discharged. He ended up in a rehab for 2 weeks and told them he needed another surgery. Was told he had hydrocephalus and need a shunt to drain liquid off his brain. She felt a lot of pressure from team that he needed surgery. She wanted a second opinion and had rouble getting it. Spoke to another Dr. and they decided to get the third surgery. He was in OT,PT and Speech Therapy. During therapy, he started having a tremor. They ended up being referred to Dr. Winferd Hatter, who has now told him that treating his tremor is tricky and will take time.  Christopher Yu is no longer in OT,PT speech therapy. He is supposed to be doing exercises on his own, but has not been emotionally able to ge himself to do it. He has persistent headaches as well. Just had a CAT Scan and awaiting the  results. His headaches are worse and they are concerned. He also has rhumatory arthritis and it is flaring up because he had to stop meds. He has cardiomyopathy, macular degeneration on top of all of these other conditions.  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 He has been approved for disability and are working to get social security disability. She works part time. They have 2 daughters, one with diabetes and arthritis. She is heading to [employer] this Fall and wants to work in Research scientist (physical sciences). Other daughter is 61 and is "our introvert with lots of anxiety". She is in therapy and has mental health issues that is being treated. She will be a Holiday representative at the middle college at [employer].  They are told that medicine will not control his condition, but they will have to try to retrain his brain. Suggested that they work to get back into OT and PT. She has a hard time motivating him. He admits that he keeps things "inside". She says he is not a good comunicator. He will not share things like if he is in pain or hurts himself.   Tx. Plan/Goals: Patient reports depressive symptoms secondary to the medical trauma he has suffered and the debilitating physical results. He is seeking therapy to resolve his feelings of helplessness and hopelessness. Also want assistance in improving his drive/motivation in daily activity. Will engage in therapy that utilizes behavioral approaches to mitigate symptoms along with insight oriented counseling. Will also incorporate some conjoint sessions with spouse as needed, given her integral involvement with his care. Patient agrees with plan for treatment. Goal Date is 12-25    Patient agrees to be seen in video Public affairs consultant) session and he is aware of platform limitations. He is at  home and provider in his office.  Session Note:  Patient talked about some of his frustrations with his dog and his efforts to manage. The  challenge is that his physical condition prevents him from being about to manage the dog due to the size of the dog and Christopher Yu's lack of balance. He says he is getting his quote tomorrow for his HVAC. Taking care of these types of household tasks helps him feel more relevant. He wishes he could speak better so he could better articulate whether he is pleased.  Christopher Yu says there are 2 types of people, pro active and reactive. Claims he is the former.                                                                Jola Nash, PhD 4:15p-5:00 45 minutes

## 2023-08-20 ENCOUNTER — Other Ambulatory Visit (HOSPITAL_COMMUNITY): Payer: Self-pay | Admitting: *Deleted

## 2023-08-20 DIAGNOSIS — R1310 Dysphagia, unspecified: Secondary | ICD-10-CM

## 2023-08-25 ENCOUNTER — Ambulatory Visit (INDEPENDENT_AMBULATORY_CARE_PROVIDER_SITE_OTHER): Admitting: Psychology

## 2023-08-25 DIAGNOSIS — F329 Major depressive disorder, single episode, unspecified: Secondary | ICD-10-CM

## 2023-08-25 DIAGNOSIS — F321 Major depressive disorder, single episode, moderate: Secondary | ICD-10-CM

## 2023-08-25 NOTE — Progress Notes (Signed)
 [facility] Counselor Initial Adult Exam  Name: Christopher Yu Date: 08/25/2023 MRN: [id number] DOB: [birth year] PCP: Tena Feeling, MD    Guardian/Payee:  N/A    Paperwork requested: No   Reason for Visit Christopher Yu Problem: Adjustment to serious medical illness  Mental Status Exam: Appearance:   Casual     Behavior:  Appropriate  Motor:  Shuffling Gait  Speech/Language:   Slurred  Affect:  Depressed  Mood:  depressed  Thought process:  normal  Thought content:    WNL  Sensory/Perceptual disturbances:    WNL  Orientation:  oriented to person, place, and situation  Attention:  Good  Concentration:  Good  Memory:  WNL  Fund of knowledge:   Good  Insight:    Good  Judgment:   unknown  Impulse Control:  unknown    Reported Symptoms:  Sadness, helplessness and hopelessness  Risk Assessment: Danger to Self:  No Self-injurious Behavior: No Danger to Others: No Duty to Warn:no Physical Aggression / Violence:No  Access to Firearms a concern:  Unknown Gang Involvement:No  Patient / guardian was educated about steps to take if suicide or homicide risk level increases between visits: n/a While future psychiatric events cannot be accurately predicted, the patient does not currently require acute inpatient psychiatric care and does not currently meet [place]  involuntary commitment criteria.  Substance Abuse History: Current substance abuse: No     Past Psychiatric History:   No previous psychological problems have been observed Outpatient Providers:N/A History of Psych Hospitalization: No  Psychological Testing:  none    Abuse History:  Victim of:   N/A   Report needed: No. Victim of Neglect:No. Perpetrator of  N/A   Witness / Exposure to Domestic Violence: No   Protective Services Involvement: No  Witness to [employer] Violence:  No   Family History:  Family History   Problem Relation Age of Onset   Other Father        MVA   Lupus Sister     Living situation: the patient lives with their family  Sexual Orientation: Straight  Relationship Status: married  Name of spouse Christopher Yu If a parent, number of children / ages:Two girls, ages 91 and 59  Support Systems: spouse  Surveyor, quantity Stress:  Yes   Income/Employment/Disability: Software engineer:  unknown  Educational History: Education:  unknown  Oncologist: unknown  Any cultural differences that may affect / interfere with treatment:  not applicable   Recreation/Hobbies: golfing  Stressors: Financial difficulties   Health problems    Strengths: Family  Barriers:  severe physical limitations that compromises activity   Legal History: Pending legal issue / charges: The patient has no significant history of legal issues. History of legal issue / charges:  N/A  Medical History/Surgical History: reviewed Past Medical History:  Diagnosis Date   Anxiety    Arthritis, rheumatoid (HCC)    dx 1988   CAD (coronary artery disease), native coronary artery    Mild LAD disease with calcification noted at cath 12//27/16    Cardiomyopathy (HCC)    Cardiomyopathy (HCC)    Chronic systolic heart failure (HCC) 05/01/2015   Depression    Deviated nasal septum 06/25/2011   Headache    High cholesterol    Hyperlipidemia  Impingement syndrome of left shoulder 12/22/2017   Macular degeneration    Nausea and vomiting 08/28/2022   Rheumatoid aortitis    Sleep apnea    does not use cpap - UNABLE TO TOLERATE MASK   Sleep apnea in adult    deviated septum repaired, most recent sleep study was negative   Status post total right knee replacement 06/01/2015    Past Surgical History:  Procedure Laterality Date   ANKLE FUSION Right 05/05/2012   related to his arthritis   ANKLE FUSION Left 05/25/2013   related to his arthritis   ANKLE SURGERY Bilateral     APPLICATION OF CRANIAL NAVIGATION N/A 04/11/2022   Procedure: APPLICATION OF CRANIAL NAVIGATION;  Surgeon: Augusto Blonder, MD;  Location: [facility];  Service: Neurosurgery;  Laterality: N/A;   APPLICATION OF CRANIAL NAVIGATION N/A 08/08/2022   Procedure: APPLICATION OF CRANIAL NAVIGATION;  Surgeon: Augusto Blonder, MD;  Location: [facility] OR;  Service: Neurosurgery;  Laterality: N/A;   BIOPSY  06/09/2023   Procedure: BIOPSY;  Surgeon: Genell Ken, MD;  Location: [facility];  Service: Gastroenterology;;   CARDIAC CATHETERIZATION N/A 05/01/2015   Procedure: Left Heart Cath and Coronary Angiography;  Surgeon: Arty Binning, MD;  Location: [facility] INVASIVE CV LAB;  Service: Cardiovascular;  Laterality: N/A;   COLONOSCOPY WITH PROPOFOL  N/A 06/09/2023   Procedure: COLONOSCOPY WITH PROPOFOL ;  Surgeon: Genell Ken, MD;  Location: [facility];  Service: Gastroenterology;  Laterality: N/A;   CRANIOTOMY N/A 04/11/2022   Procedure: STEREOTACTIC SUBOCCIPITAL CRANIOTOMY FOR RESECTION OF ARTERIO-VENOUS MALFORMATION;  Surgeon: Augusto Blonder, MD;  Location: [facility];  Service: Neurosurgery;  Laterality: N/A;   ESOPHAGOGASTRODUODENOSCOPY (EGD) WITH PROPOFOL  N/A 06/09/2023   Procedure: ESOPHAGOGASTRODUODENOSCOPY (EGD) WITH PROPOFOL ;  Surgeon: Genell Ken, MD;  Location: [facility];  Service: Gastroenterology;  Laterality: N/A;   FRACTURE SURGERY     left femur fracture x 3   HEMOSTASIS CLIP PLACEMENT  06/09/2023   Procedure: HEMOSTASIS CLIP PLACEMENT;  Surgeon: Genell Ken, MD;  Location: [facility];  Service: Gastroenterology;;   IR ANGIO EXTERNAL CAROTID SEL EXT CAROTID BILAT MOD SED  01/28/2022   IR ANGIO INTRA EXTRACRAN SEL INTERNAL CAROTID BILAT MOD SED  01/28/2022   IR ANGIO INTRA EXTRACRAN SEL INTERNAL CAROTID BILAT MOD SED  04/16/2022   IR ANGIO VERTEBRAL SEL VERTEBRAL BILAT MOD SED  01/28/2022   IR ANGIO VERTEBRAL SEL VERTEBRAL BILAT MOD SED  04/16/2022   KNEE ARTHROSCOPY     right x 4   LAPAROSCOPIC REVISION  VENTRICULAR-PERITONEAL (V-P) SHUNT N/A 08/08/2022   Procedure: LAPAROSCOPIC VENTRICULAR-PERITONEAL (V-P) SHUNT;  Surgeon: Sim Dryer, MD;  Location: [facility];  Service: General;  Laterality: N/A;   LUMBAR LAMINECTOMY/DECOMPRESSION MICRODISCECTOMY N/A 07/18/2022   Procedure: Repair of Pseudomeningiocele posterior;  Surgeon: Augusto Blonder, MD;  Location: [facility];  Service: Neurosurgery;  Laterality: N/A;   NASAL SEPTOPLASTY W/ TURBINOPLASTY  06/25/2011   Procedure: NASAL SEPTOPLASTY WITH TURBINATE REDUCTION;  Surgeon: Ammon Bales, MD;  Location: [facility] OR;  Service: ENT;  Laterality: Bilateral;   PLACEMENT OF LUMBAR DRAIN N/A 07/18/2022   Procedure: PLACEMENT OF LUMBAR DRAIN;  Surgeon: Augusto Blonder, MD;  [facility]: [facility];  Service: Neurosurgery;  Laterality: N/A;   POLYPECTOMY  06/09/2023   Procedure: POLYPECTOMY;  Surgeon: Genell Ken, MD;  Location: [facility];  Service: Gastroenterology;;   TONSILLECTOMY     TOTAL KNEE ARTHROPLASTY Right 06/01/2015   Procedure: RIGHT TOTAL KNEE ARTHROPLASTY;  Surgeon: Arnie Lao, MD;  Location: [facility];  Service: Orthopedics;  Laterality: Right;  Block+general   VENTRICULOPERITONEAL SHUNT Right 08/08/2022   Procedure: LAP ASSITED SHUNT INSERTION VENTRICULAR-PERITONEAL;  Surgeon: Augusto Blonder, MD;  Location: [facility];  Service: Neurosurgery;  Laterality: Right;    Medications: Current Outpatient Medications  Medication Sig Dispense Refill   acetaminophen  (TYLENOL ) 325 MG tablet Take 2 tablets (650 mg total) by mouth 3 (three) times daily.     alendronate (FOSAMAX) 10 MG tablet Take 10 mg by mouth daily before breakfast. Take with a full glass of water  on an empty stomach.     artificial tears (LACRILUBE) OINT ophthalmic ointment Place into both eyes 2 (two) times daily.     aspirin  EC 81 MG tablet Take 1 tablet (81 mg total) by mouth daily. Swallow whole. 30 tablet 12   calcium  carbonate (TUMS - DOSED IN MG ELEMENTAL CALCIUM ) 500 MG chewable  tablet Chew 1 tablet (200 mg of elemental calcium  total) by mouth daily with supper.     Cholecalciferol (VITAMIN D3) 50 MCG (2000 UT) TABS Take 2,000 Units by mouth daily.     famotidine (PEPCID) 40 MG tablet 1 tablet Orally Once a day for 30 days     folic acid  (FOLVITE ) 1 MG tablet Take 1 mg by mouth daily.     methotrexate  (RHEUMATREX) 2.5 MG tablet Take 20 mg by mouth once a week. Takes 8 tablets(20mg ) every Saturday.     Caution:Chemotherapy. Protect from light.     metoprolol  succinate (TOPROL -XL) 25 MG 24 hr tablet TAKE 1 TABLET DAILY 90 tablet 3   Multiple Vitamins-Minerals (PRESERVISION AREDS 2 PO) Take 2 capsules by mouth daily.     predniSONE  (STERAPRED UNI-PAK 21 TAB) 5 MG (21) TBPK tablet Take 5 mg by mouth every morning.     RITUXAN  500 MG/50ML injection See admin instructions. Every 6 months 2 dose infusion Ruxience      rosuvastatin  (CRESTOR ) 20 MG tablet TAKE 1 TABLET DAILY (DOSE INCREASE) 90 tablet 2   sertraline  (ZOLOFT ) 20 MG/ML concentrated solution Take 25 mg by mouth daily.     spironolactone  (ALDACTONE ) 25 MG tablet 12.5 mg. 1/2 tab daily     sulfaSALAzine  (AZULFIDINE ) 500 MG tablet Take 1,000 mg by mouth 2 (two) times daily.     traMADol  (ULTRAM ) 50 MG tablet Take 1 tablet (50 mg total) by mouth every 12 (twelve) hours as needed for moderate pain. (Patient taking differently: Take 50 mg by mouth as needed for moderate pain (pain score 4-6).) 60 tablet 2   traZODone  (DESYREL ) 50 MG tablet TAKE 1.5 TABLETS (75 MG TOTAL) BY MOUTH AT BEDTIME AS NEEDED. FOR SLEEP 135 tablet 5   White Petrolatum -Mineral Oil (GENTEAL TEARS NIGHT-TIME OP) Apply to eye at bedtime.     No current facility-administered medications for this visit.    Allergies  Allergen Reactions   Hydroxychloroquine      Other Reaction(s): cardiolmyopathy    Diagnoses:  Major depression  Plan of Care: Outpatient psychotherapy and medication management as needed. Initial Note: (With wife Christopher Yu). She says  that she has hoped Ulysses would get into therapy. He has Ataxic Tremor. He told Dr. Winferd Hatter that he would like medication to just sit and do nothing. He says he always had golf to rey on and now cannot play. In December, he went n for a surgery to remove an avm from brain. Was told it was a routine surgery and was told that he needs to get it out. Was told it would be a short recovery and he  would be back to work. 1 1/2 hour surgery turned into 6 hour surgery and he had complications. He had extra cerebral fluid and Dr. Arnetta Lank it would absorb and just wait. He kept declining and by March, they went in to try and drain the extra fluid. He was in hospital for a week. Wife noticed that during the hospital stay, his speech declined and he was clearly weak. Nurses were trying to discharge him and she protested, but they sent him home anyway. He declined, and could not sit, stand, talk or walk and she ended up calling ambulance. In ER, she ws told he had a stroke. He was in ER 24 hours before getting a room in Neuro area. Head of stroke team came in and said it was not a stroke, but complications from surgery. She was blamed by medical team for not "speaking up" before being initially discharged. He ended up in a rehab for 2 weeks and told them he needed another surgery. Was told he had hydrocephalus and need a shunt to drain liquid off his brain. She felt a lot of pressure from team that he needed surgery. She wanted a second opinion and had rouble getting it. Spoke to another Dr. and they decided to get the third surgery. He was in OT,PT and Speech Therapy. During therapy, he started having a tremor. They ended up being referred to Dr. Winferd Hatter, who has now told him that treating his tremor is tricky and will take time.  Jamin is no longer in OT,PT speech therapy. He is supposed to be doing exercises on his own, but has not been emotionally able to ge himself to do it. He has persistent headaches as well. Just had a CAT Scan and  awaiting the results. His headaches are worse and they are concerned. He also has rhumatory arthritis and it is flaring up because he had to stop meds. He has cardiomyopathy, macular degeneration on top of all of these other conditions.  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 He has been approved for disability and are working to get social security disability. She works part time. They have 2 daughters, one with diabetes and arthritis. She is heading to [employer] this Fall and wants to work in Research scientist (physical sciences). Other daughter is 65 and is "our introvert with lots of anxiety". She is in therapy and has mental health issues that is being treated. She will be a Holiday representative at the middle college at [employer].  They are told that medicine will not control his condition, but they will have to try to retrain his brain. Suggested that they work to get back into OT and PT. She has a hard time motivating him. He admits that he keeps things "inside". She says he is not a good comunicator. He will not share things like if he is in pain or hurts himself.   Tx. Plan/Goals: Patient reports depressive symptoms secondary to the medical trauma he has suffered and the debilitating physical results. He is seeking therapy to resolve his feelings of helplessness and hopelessness. Also want assistance in improving his drive/motivation in daily activity. Will engage in therapy that utilizes behavioral approaches to mitigate symptoms along with insight oriented counseling. Will also incorporate some conjoint sessions with spouse as needed, given her integral involvement with his care. Patient agrees with plan for treatment. Goal Date is 12-25    Patient agrees to be  seen in video (Caregility) session and he is aware of platform limitations. He is at home and provider in his office.  Session Note:  Patient says that he has had a good and productive week. He is thinking that  he will stay in their house "for a while". He does not want to move anytime soon. He talked about the very different spending styles between he and his wife. Discussed his tendency to be a "straight shooter" and has definite opinions. He is selective about whom he shares those opinions. He characterizes himself as an "all or nothing person". This works both for and against him at times.  Discussed how it feels to have people assume he lacks intelligence due to the way he speaks.                                                                 Jola Nash, PhD 2:10p-3:00 50 minutes

## 2023-08-26 ENCOUNTER — Ambulatory Visit: Payer: BC Managed Care – PPO | Admitting: Psychology

## 2023-08-31 DIAGNOSIS — M810 Age-related osteoporosis without current pathological fracture: Secondary | ICD-10-CM | POA: Diagnosis not present

## 2023-09-02 ENCOUNTER — Ambulatory Visit (INDEPENDENT_AMBULATORY_CARE_PROVIDER_SITE_OTHER): Payer: BC Managed Care – PPO | Admitting: Psychology

## 2023-09-02 DIAGNOSIS — F321 Major depressive disorder, single episode, moderate: Secondary | ICD-10-CM | POA: Diagnosis not present

## 2023-09-02 NOTE — Progress Notes (Signed)
 [facility] Counselor Initial Adult Exam  Name: Christopher Yu Date: 09/02/2023 MRN: [id number] DOB: 04/01/1961 PCP: Christopher Feeling, MD    Guardian/Payee:  N/A    Paperwork requested: No   Reason for Visit Christopher Yu Problem: Adjustment to serious medical illness  Mental Status Exam: Appearance:   Casual     Behavior:  Appropriate  Motor:  Shuffling Gait  Speech/Language:   Slurred  Affect:  Depressed  Mood:  depressed  Thought process:  normal  Thought content:    WNL  Sensory/Perceptual disturbances:    WNL  Orientation:  oriented to person, place, and situation  Attention:  Good  Concentration:  Good  Memory:  WNL  Fund of knowledge:   Good  Insight:    Good  Judgment:   unknown  Impulse Control:  unknown    Reported Symptoms:  Sadness, helplessness and hopelessness  Risk Assessment: Danger to Self:  No Self-injurious Behavior: No Danger to Others: No Duty to Warn:no Physical Aggression / Violence:No  Access to Firearms a concern:  Unknown Gang Involvement:No  Patient / guardian was educated about steps to take if suicide or homicide risk level increases between visits: n/a While future psychiatric events cannot be accurately predicted, the patient does not currently require acute inpatient psychiatric care and does not currently meet [place]  involuntary commitment criteria.  Substance Abuse History: Current substance abuse: No     Past Psychiatric History:   No previous psychological problems have been observed Outpatient Providers:N/A History of Psych Hospitalization: No  Psychological Testing:  none    Abuse History:  Victim of:   N/A   Report needed: No. Victim of Neglect:No. Perpetrator of  N/A   Witness / Exposure to Domestic Violence: No   Protective Services Involvement: No  Witness to [employer] Violence:  No    Family History:  Family History  Problem Relation Age of Onset   Other Father        MVA   Lupus Sister     Living situation: the patient lives with their family  Sexual Orientation: Straight  Relationship Status: married  Name of spouse Christopher Yu If a parent, number of children / ages:Two girls, ages 63 and 10  Support Systems: spouse  Surveyor, quantity Stress:  Yes   Income/Employment/Disability: Software engineer:  unknown  Educational History: Education:  unknown  Oncologist: unknown  Any cultural differences that may affect / interfere with treatment:  not applicable   Recreation/Hobbies: golfing  Stressors: Financial difficulties   Health problems    Strengths: Family  Barriers:  severe physical limitations that compromises activity   Legal History: Pending legal issue / charges: The patient has no significant history of legal issues. History of legal issue / charges:  N/A  Medical History/Surgical History: reviewed Past Medical History:  Diagnosis Date   Anxiety    Arthritis, rheumatoid (HCC)    dx 1988   CAD (coronary artery disease), native coronary artery    Mild LAD disease with calcification noted at cath 12//27/16    Cardiomyopathy (HCC)    Cardiomyopathy (HCC)    Chronic systolic heart failure (HCC) 05/01/2015   Depression    Deviated nasal septum 06/25/2011  Headache    High cholesterol    Hyperlipidemia    Impingement syndrome of left shoulder 12/22/2017   Macular degeneration    Nausea and vomiting 08/28/2022   Rheumatoid aortitis    Sleep apnea    does not use cpap - UNABLE TO TOLERATE MASK   Sleep apnea in adult    deviated septum repaired, most recent sleep study was negative   Status post total right knee replacement 06/01/2015    Past Surgical History:  Procedure Laterality Date   ANKLE FUSION Right 05/05/2012   related to his arthritis   ANKLE FUSION Left 05/25/2013   related to his  arthritis   ANKLE SURGERY Bilateral    APPLICATION OF CRANIAL NAVIGATION N/A 04/11/2022   Procedure: APPLICATION OF CRANIAL NAVIGATION;  Surgeon: Augusto Blonder, MD;  Location: [facility] OR;  Service: Neurosurgery;  Laterality: N/A;   APPLICATION OF CRANIAL NAVIGATION N/A 08/08/2022   Procedure: APPLICATION OF CRANIAL NAVIGATION;  Surgeon: Augusto Blonder, MD;  Location: [facility] OR;  Service: Neurosurgery;  Laterality: N/A;   BIOPSY  06/09/2023   Procedure: BIOPSY;  Surgeon: Genell Ken, MD;  Location: [facility];  Service: Gastroenterology;;   CARDIAC CATHETERIZATION N/A 05/01/2015   Procedure: Left Heart Cath and Coronary Angiography;  Surgeon: Arty Binning, MD;  Location: [facility] INVASIVE CV LAB;  Service: Cardiovascular;  Laterality: N/A;   COLONOSCOPY WITH PROPOFOL  N/A 06/09/2023   Procedure: COLONOSCOPY WITH PROPOFOL ;  Surgeon: Genell Ken, MD;  Location: [facility];  Service: Gastroenterology;  Laterality: N/A;   CRANIOTOMY N/A 04/11/2022   Procedure: STEREOTACTIC SUBOCCIPITAL CRANIOTOMY FOR RESECTION OF ARTERIO-VENOUS MALFORMATION;  Surgeon: Augusto Blonder, MD;  Location: [facility];  Service: Neurosurgery;  Laterality: N/A;   ESOPHAGOGASTRODUODENOSCOPY (EGD) WITH PROPOFOL  N/A 06/09/2023   Procedure: ESOPHAGOGASTRODUODENOSCOPY (EGD) WITH PROPOFOL ;  Surgeon: Genell Ken, MD;  Location: [facility];  Service: Gastroenterology;  Laterality: N/A;   FRACTURE SURGERY     left femur fracture x 3   HEMOSTASIS CLIP PLACEMENT  06/09/2023   Procedure: HEMOSTASIS CLIP PLACEMENT;  Surgeon: Genell Ken, MD;  Location: [facility];  Service: Gastroenterology;;   IR ANGIO EXTERNAL CAROTID SEL EXT CAROTID BILAT MOD SED  01/28/2022   IR ANGIO INTRA EXTRACRAN SEL INTERNAL CAROTID BILAT MOD SED  01/28/2022   IR ANGIO INTRA EXTRACRAN SEL INTERNAL CAROTID BILAT MOD SED  04/16/2022   IR ANGIO VERTEBRAL SEL VERTEBRAL BILAT MOD SED  01/28/2022   IR ANGIO VERTEBRAL SEL VERTEBRAL BILAT MOD SED  04/16/2022   KNEE ARTHROSCOPY      right x 4   LAPAROSCOPIC REVISION VENTRICULAR-PERITONEAL (V-P) SHUNT N/A 08/08/2022   Procedure: LAPAROSCOPIC VENTRICULAR-PERITONEAL (V-P) SHUNT;  Surgeon: Sim Dryer, MD;  Location: [facility];  Service: General;  Laterality: N/A;   LUMBAR LAMINECTOMY/DECOMPRESSION MICRODISCECTOMY N/A 07/18/2022   Procedure: Repair of Pseudomeningiocele posterior;  Surgeon: Augusto Blonder, MD;  Location: [facility];  Service: Neurosurgery;  Laterality: N/A;   NASAL SEPTOPLASTY W/ TURBINOPLASTY  06/25/2011   Procedure: NASAL SEPTOPLASTY WITH TURBINATE REDUCTION;  Surgeon: Ammon Bales, MD;  Location: [facility] OR;  Service: ENT;  Laterality: Bilateral;   PLACEMENT OF LUMBAR DRAIN N/A 07/18/2022   Procedure: PLACEMENT OF LUMBAR DRAIN;  Surgeon: Augusto Blonder, MD;  Location: [facility];  Service: Neurosurgery;  Laterality: N/A;   POLYPECTOMY  06/09/2023   Procedure: POLYPECTOMY;  Surgeon: Genell Ken, MD;  Location: [facility];  Service: Gastroenterology;;   TONSILLECTOMY     TOTAL KNEE ARTHROPLASTY Right 06/01/2015   Procedure: RIGHT TOTAL KNEE ARTHROPLASTY;  Surgeon: Arnie Lao, MD;  Location: [facility];  Service: Orthopedics;  Laterality: Right;  Block+general   VENTRICULOPERITONEAL SHUNT Right 08/08/2022   Procedure: LAP ASSITED SHUNT INSERTION VENTRICULAR-PERITONEAL;  Surgeon: Augusto Blonder, MD;  Location: [facility];  Service: Neurosurgery;  Laterality: Right;    Medications: Current Outpatient Medications  Medication Sig Dispense Refill   acetaminophen  (TYLENOL ) 325 MG tablet Take 2 tablets (650 mg total) by mouth 3 (three) times daily.     alendronate (FOSAMAX) 10 MG tablet Take 10 mg by mouth daily before breakfast. Take with a full glass of water  on an empty stomach.     artificial tears (LACRILUBE) OINT ophthalmic ointment Place into both eyes 2 (two) times daily.     aspirin  EC 81 MG tablet Take 1 tablet (81 mg total) by mouth daily. Swallow whole. 30 tablet 12   calcium  carbonate (TUMS - DOSED  IN MG ELEMENTAL CALCIUM ) 500 MG chewable tablet Chew 1 tablet (200 mg of elemental calcium  total) by mouth daily with supper.     Cholecalciferol (VITAMIN D3) 50 MCG (2000 UT) TABS Take 2,000 Units by mouth daily.     famotidine (PEPCID) 40 MG tablet 1 tablet Orally Once a day for 30 days     folic acid  (FOLVITE ) 1 MG tablet Take 1 mg by mouth daily.     methotrexate  (RHEUMATREX) 2.5 MG tablet Take 20 mg by mouth once a week. Takes 8 tablets(20mg ) every Saturday.     Caution:Chemotherapy. Protect from light.     metoprolol  succinate (TOPROL -XL) 25 MG 24 hr tablet TAKE 1 TABLET DAILY 90 tablet 3   Multiple Vitamins-Minerals (PRESERVISION AREDS 2 PO) Take 2 capsules by mouth daily.     predniSONE  (STERAPRED UNI-PAK 21 TAB) 5 MG (21) TBPK tablet Take 5 mg by mouth every morning.     RITUXAN  500 MG/50ML injection See admin instructions. Every 6 months 2 dose infusion Ruxience      rosuvastatin  (CRESTOR ) 20 MG tablet TAKE 1 TABLET DAILY (DOSE INCREASE) 90 tablet 2   sertraline  (ZOLOFT ) 20 MG/ML concentrated solution Take 25 mg by mouth daily.     spironolactone  (ALDACTONE ) 25 MG tablet 12.5 mg. 1/2 tab daily     sulfaSALAzine  (AZULFIDINE ) 500 MG tablet Take 1,000 mg by mouth 2 (two) times daily.     traMADol  (ULTRAM ) 50 MG tablet Take 1 tablet (50 mg total) by mouth every 12 (twelve) hours as needed for moderate pain. (Patient taking differently: Take 50 mg by mouth as needed for moderate pain (pain score 4-6).) 60 tablet 2   traZODone  (DESYREL ) 50 MG tablet TAKE 1.5 TABLETS (75 MG TOTAL) BY MOUTH AT BEDTIME AS NEEDED. FOR SLEEP 135 tablet 5   White Petrolatum -Mineral Oil (GENTEAL TEARS NIGHT-TIME OP) Apply to eye at bedtime.     No current facility-administered medications for this visit.    Allergies  Allergen Reactions   Hydroxychloroquine      Other Reaction(s): cardiolmyopathy    Diagnoses:  Major depression  Plan of Care: Outpatient psychotherapy and medication management as  needed. Initial Note: (With wife Christopher Yu). She says that she has hoped Timmy would get into therapy. He has Ataxic Tremor. He told Dr. Winferd Hatter that he would like medication to just sit and do nothing. He says he always had golf to rey on and now cannot play. In December, he went n for a surgery to remove an avm from brain. Was told it was a routine surgery and was told that he needs to  get it out. Was told it would be a short recovery and he would be back to work. 1 1/2 hour surgery turned into 6 hour surgery and he had complications. He had extra cerebral fluid and Dr. Arnetta Lank it would absorb and just wait. He kept declining and by March, they went in to try and drain the extra fluid. He was in hospital for a week. Wife noticed that during the hospital stay, his speech declined and he was clearly weak. Nurses were trying to discharge him and she protested, but they sent him home anyway. He declined, and could not sit, stand, talk or walk and she ended up calling ambulance. In ER, she ws told he had a stroke. He was in ER 24 hours before getting a room in Neuro area. Head of stroke team came in and said it was not a stroke, but complications from surgery. She was blamed by medical team for not "speaking up" before being initially discharged. He ended up in a rehab for 2 weeks and told them he needed another surgery. Was told he had hydrocephalus and need a shunt to drain liquid off his brain. She felt a lot of pressure from team that he needed surgery. She wanted a second opinion and had rouble getting it. Spoke to another Dr. and they decided to get the third surgery. He was in OT,PT and Speech Therapy. During therapy, he started having a tremor. They ended up being referred to Dr. Winferd Hatter, who has now told him that treating his tremor is tricky and will take time.  Fadil is no longer in OT,PT speech therapy. He is supposed to be doing exercises on his own, but has not been emotionally able to ge himself to do it. He has  persistent headaches as well. Just had a CAT Scan and awaiting the results. His headaches are worse and they are concerned. He also has rhumatory arthritis and it is flaring up because he had to stop meds. He has cardiomyopathy, macular degeneration on top of all of these other conditions.  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 He has been approved for disability and are working to get social security disability. She works part time. They have 2 daughters, one with diabetes and arthritis. She is heading to [employer] this Fall and wants to work in Research scientist (physical sciences). Other daughter is 56 and is "our introvert with lots of anxiety". She is in therapy and has mental health issues that is being treated. She will be a Holiday representative at the middle college at [employer].  They are told that medicine will not control his condition, but they will have to try to retrain his brain. Suggested that they work to get back into OT and PT. She has a hard time motivating him. He admits that he keeps things "inside". She says he is not a good comunicator. He will not share things like if he is in pain or hurts himself.   Tx. Plan/Goals: Patient reports depressive symptoms secondary to the medical trauma he has suffered and the debilitating physical results. He is seeking therapy to resolve his feelings of helplessness and hopelessness. Also want assistance in improving his drive/motivation in daily activity. Will engage in therapy that utilizes behavioral approaches to mitigate symptoms along with insight oriented counseling. Will also incorporate some conjoint sessions with spouse as needed, given her integral involvement with his care. Patient agrees with plan  for treatment. Goal Date is 12-25    Patient agrees to be seen in video Public affairs consultant) session and he is aware of platform limitations. He is at home and provider in his office.  Session Note:  Patient states that not  much was going on the past week. He got quotes for having a pool installed, but hasn't shared with wife. He said his friends are going to have him come along for golf and he will try playing a couple of holes. His oldest daughter is expecting first child in October and she is having a girl. They live outside of [place]. We discussed the transition to being a grandparent and expectations.                                                                      Jola Nash, PhD 4:10p-5:00 50 minutes

## 2023-09-04 ENCOUNTER — Encounter (HOSPITAL_COMMUNITY): Payer: Self-pay

## 2023-09-04 ENCOUNTER — Ambulatory Visit (HOSPITAL_COMMUNITY)
Admission: RE | Admit: 2023-09-04 | Discharge: 2023-09-04 | Disposition: A | Discharge status: Home / Self Care | Source: Ambulatory Visit | Attending: Internal Medicine | Admitting: Internal Medicine

## 2023-09-04 DIAGNOSIS — Z8673 Personal history of transient ischemic attack (TIA), and cerebral infarction without residual deficits: Secondary | ICD-10-CM | POA: Insufficient documentation

## 2023-09-04 DIAGNOSIS — R1312 Dysphagia, oropharyngeal phase: Secondary | ICD-10-CM | POA: Insufficient documentation

## 2023-09-04 DIAGNOSIS — R1310 Dysphagia, unspecified: Secondary | ICD-10-CM

## 2023-09-04 DIAGNOSIS — I639 Cerebral infarction, unspecified: Secondary | ICD-10-CM

## 2023-09-06 DIAGNOSIS — G4733 Obstructive sleep apnea (adult) (pediatric): Secondary | ICD-10-CM | POA: Diagnosis not present

## 2023-09-07 ENCOUNTER — Other Ambulatory Visit: Payer: Self-pay

## 2023-09-07 ENCOUNTER — Ambulatory Visit: Attending: Physical Medicine and Rehabilitation | Admitting: Speech Pathology

## 2023-09-07 ENCOUNTER — Encounter: Payer: Self-pay | Admitting: Speech Pathology

## 2023-09-07 DIAGNOSIS — R471 Dysarthria and anarthria: Secondary | ICD-10-CM | POA: Insufficient documentation

## 2023-09-07 DIAGNOSIS — I639 Cerebral infarction, unspecified: Secondary | ICD-10-CM | POA: Insufficient documentation

## 2023-09-07 DIAGNOSIS — R1310 Dysphagia, unspecified: Secondary | ICD-10-CM | POA: Diagnosis not present

## 2023-09-07 DIAGNOSIS — R1312 Dysphagia, oropharyngeal phase: Secondary | ICD-10-CM | POA: Diagnosis not present

## 2023-09-07 NOTE — Patient Instructions (Signed)
  Be mindful, be deliberate, focus on your chewing and swallowing - swallow with intent  Be extra careful when eating mixed consistencies (cereal and milk, soup with meat or veggies, fruit cocktail or juicy fruits) - these are hard to manage because there is a liquid and solid component  Limit distractions during meals and drinking  SWALLOWING EXERCISES Effortful Swallows - Squeeze hard with the muscles in your neck while you swallow your  saliva or a sip of water  - Repeat 20 times, 2-3 times a day, and use whenever you eat or drink  [place] Swallow - swallow with your tongue sticking out - Stick tongue out and gently bite tongue with your teeth - Swallow, while holding your tongue with your teeth - Repeat 20 times, 2-3 times a day   Shaker Exercise - head lift - Lie flat on your back in your bed or on a couch without pillows - Raise your head and look at your feet  - KEEP YOUR SHOULDERS DOWN - HOLD FOR 45 to 60 SECONDS, then lower your head back down - Repeat 3 times, 2-3 times a day - then raise and lower your head (nod) 25 times 2-3x a day   [employer] -  swallow as tight as you  for 5 seconds - Start to swallow, and keep your Adam's apple up by squeezing tight with the muscles of the throat - Hold the squeeze for 5-7 seconds and then relax - Repeat 20 times, 2-3 times a day   Tongue Press - Press your entire tongue as hard as you can against the roof of your mouth for 3-5 seconds - Repeat 20 times, 2-3 times a day        6. CTAR - Chin Tuck Against Resistance              - Place towel, ball or pool noodle under your chin             - Hold for 60 seconds 2-3x  a day             - Pulse up and down 20x 2-3x a day

## 2023-09-07 NOTE — Therapy (Signed)
 OUTPATIENT SPEECH LANGUAGE PATHOLOGY SWALLOW EVALUATION   Patient Name: Christopher Yu MRN: [id number] DOB:Jul 11, 1960, 63 y.o., male Today's Date: 09/07/2023  PCP: Tena Feeling, MD REFERRING PROVIDER: Bea Lime, DO  END OF SESSION:  End of Session - 09/07/23 1526     Visit Number 1    Number of Visits 17    Date for SLP Re-Evaluation 11/30/23   extened due to scheduling   Authorization Type [employer]    SLP Start Time 1445    SLP Stop Time  1530    SLP Time Calculation (min) 45 min    Activity Tolerance Patient tolerated treatment well             Past Medical History:  Diagnosis Date   Anxiety    Arthritis, rheumatoid (HCC)    dx 1988   CAD (coronary artery disease), native coronary artery    Mild LAD disease with calcification noted at cath 12//27/16    Cardiomyopathy (HCC)    Cardiomyopathy (HCC)    Chronic systolic heart failure (HCC) 05/01/2015   Depression    Deviated nasal septum 06/25/2011   Headache    High cholesterol    Hyperlipidemia    Impingement syndrome of left shoulder 12/22/2017   Macular degeneration    Nausea and vomiting 08/28/2022   Rheumatoid aortitis    Sleep apnea    does not use cpap - UNABLE TO TOLERATE MASK   Sleep apnea in adult    deviated septum repaired, most recent sleep study was negative   Status post total right knee replacement 06/01/2015   Past Surgical History:  Procedure Laterality Date   ANKLE FUSION Right 05/05/2012   related to his arthritis   ANKLE FUSION Left 05/25/2013   related to his arthritis   ANKLE SURGERY Bilateral    APPLICATION OF CRANIAL NAVIGATION N/A 04/11/2022   Procedure: APPLICATION OF CRANIAL NAVIGATION;  Surgeon: Augusto Blonder, MD;  Location: [facility];  Service: Neurosurgery;  Laterality: N/A;   APPLICATION OF CRANIAL NAVIGATION N/A 08/08/2022   Procedure: APPLICATION OF CRANIAL NAVIGATION;  Surgeon: Augusto Blonder, MD;  Location: [facility];  Service: Neurosurgery;  Laterality:  N/A;   BIOPSY  06/09/2023   Procedure: BIOPSY;  Surgeon: Genell Ken, MD;  Location: [facility];  Service: Gastroenterology;;   CARDIAC CATHETERIZATION N/A 05/01/2015   Procedure: Left Heart Cath and Coronary Angiography;  Surgeon: Arty Binning, MD;  Location: [facility] INVASIVE CV LAB;  Service: Cardiovascular;  Laterality: N/A;   COLONOSCOPY WITH PROPOFOL  N/A 06/09/2023   Procedure: COLONOSCOPY WITH PROPOFOL ;  Surgeon: Genell Ken, MD;  Location: [facility];  Service: Gastroenterology;  Laterality: N/A;   CRANIOTOMY N/A 04/11/2022   Procedure: STEREOTACTIC SUBOCCIPITAL CRANIOTOMY FOR RESECTION OF ARTERIO-VENOUS MALFORMATION;  Surgeon: Augusto Blonder, MD;  Location: [facility];  Service: Neurosurgery;  Laterality: N/A;   ESOPHAGOGASTRODUODENOSCOPY (EGD) WITH PROPOFOL  N/A 06/09/2023   Procedure: ESOPHAGOGASTRODUODENOSCOPY (EGD) WITH PROPOFOL ;  Surgeon: Genell Ken, MD;  Location: [facility];  Service: Gastroenterology;  Laterality: N/A;   FRACTURE SURGERY     left femur fracture x 3   HEMOSTASIS CLIP PLACEMENT  06/09/2023   Procedure: HEMOSTASIS CLIP PLACEMENT;  Surgeon: Genell Ken, MD;  Location: [facility];  Service: Gastroenterology;;   IR ANGIO EXTERNAL CAROTID SEL EXT CAROTID BILAT MOD SED  01/28/2022   IR ANGIO INTRA EXTRACRAN SEL INTERNAL CAROTID BILAT MOD SED  01/28/2022   IR ANGIO INTRA EXTRACRAN SEL INTERNAL CAROTID BILAT MOD SED  04/16/2022   IR ANGIO  VERTEBRAL SEL VERTEBRAL BILAT MOD SED  01/28/2022   IR ANGIO VERTEBRAL SEL VERTEBRAL BILAT MOD SED  04/16/2022   KNEE ARTHROSCOPY     right x 4   LAPAROSCOPIC REVISION VENTRICULAR-PERITONEAL (V-P) SHUNT N/A 08/08/2022   Procedure: LAPAROSCOPIC VENTRICULAR-PERITONEAL (V-P) SHUNT;  Surgeon: Sim Dryer, MD;  Location: [facility];  Service: General;  Laterality: N/A;   LUMBAR LAMINECTOMY/DECOMPRESSION MICRODISCECTOMY N/A 07/18/2022   Procedure: Repair of Pseudomeningiocele posterior;  Surgeon: Augusto Blonder, MD;  Location: [facility];  Service:  Neurosurgery;  Laterality: N/A;   NASAL SEPTOPLASTY W/ TURBINOPLASTY  06/25/2011   Procedure: NASAL SEPTOPLASTY WITH TURBINATE REDUCTION;  Surgeon: Ammon Bales, MD;  Location: [facility] OR;  Service: ENT;  Laterality: Bilateral;   PLACEMENT OF LUMBAR DRAIN N/A 07/18/2022   Procedure: PLACEMENT OF LUMBAR DRAIN;  Surgeon: Augusto Blonder, MD;  Location: [facility];  Service: Neurosurgery;  Laterality: N/A;   POLYPECTOMY  06/09/2023   Procedure: POLYPECTOMY;  Surgeon: Genell Ken, MD;  Location: [facility];  Service: Gastroenterology;;   TONSILLECTOMY     TOTAL KNEE ARTHROPLASTY Right 06/01/2015   Procedure: RIGHT TOTAL KNEE ARTHROPLASTY;  Surgeon: Arnie Lao, MD;  Location: [facility];  Service: Orthopedics;  Laterality: Right;  Block+general   VENTRICULOPERITONEAL SHUNT Right 08/08/2022   Procedure: LAP ASSITED SHUNT INSERTION VENTRICULAR-PERITONEAL;  Surgeon: Augusto Blonder, MD;  Location: [facility];  Service: Neurosurgery;  Laterality: Right;   Patient Active Problem List   Diagnosis Date Noted   Dysphagia 05/04/2023   At risk for central sleep apnea 03/05/2023   Chronic nonintractable headache 02/02/2023   Insomnia 10/20/2022   Anxiety and depression 10/20/2022   Nausea and vomiting 08/28/2022   Adjustment disorder 08/06/2022   Protein-calorie malnutrition, severe 07/31/2022   Cerebellar stroke (HCC) 07/29/2022   Ischemic cerebrovascular accident (CVA) (HCC) 07/28/2022   Prolonged QT interval 07/28/2022   Pseudomeningocele 07/18/2022   Pseudomeningocele due to surgical procedure 07/18/2022   Status post craniotomy 04/11/2022   AVM (arteriovenous malformation) brain 04/11/2022   Impingement syndrome of left shoulder 12/22/2017   Impingement syndrome of right shoulder 12/22/2017   Rheumatoid arthritis involving right knee (HCC) 06/01/2015   Status post total right knee replacement 06/01/2015   Cardiomyopathy (HCC) 05/02/2015   Hyperlipidemia    Sleep apnea in adult    Rheumatoid  arthritis (HCC)    Chronic systolic heart failure (HCC) 05/01/2015   CAD (coronary artery disease), native coronary artery     ONSET DATE: 08/13/2023 (referral date); CVA 07/28/22  REFERRING DIAG: R13.10 (ICD-10-CM) - Dysphagia, unspecified type I63.9 (ICD-10-CM) - Cerebellar stroke (HCC)  THERAPY DIAG:  Dysphagia, oropharyngeal phase  Dysarthria and anarthria  Rationale for Evaluation and Treatment: Rehabilitation  SUBJECTIVE:   SUBJECTIVE STATEMENT: "They mostly talked to [place]" re: results of MBSS Pt accompanied by: significant other [name]  PERTINENT HISTORY: Mr. Beazley is seen today referred for  MBSS and outpatient ST as he and his wife both reported increased coughing with solids and thin liquids, which prompted scheduling MBSS. Pt has a hx of dysarthria and dysphagia following stroke in March 2024. Prior MBSS completed 08/18/22 with recommendation of a regular diet, nectar-thick liquids, and L head turn strategy. He completed outpatient SLP tx in June 2024 and self-advanced diet to regular solids and thin liquids and he had elected to defer head turn left with swallow, therefore a follow up MBSS was forgone. Per Mr. Prudencio and wife, he has not had any concerns for pneumonia or pulmonary infection since advancing to thin liquids,  despite coughing occurences.  PAIN:  Are you having pain? No  FALLS: Has patient fallen in last 6 months?  No  LIVING ENVIRONMENT: Lives with: lives with their family Lives in: House/apartment  PLOF:  Level of assistance: Independent with ADLs, Needed assistance with IADLS Employment: On disability  PATIENT GOALS: "To improve my ability to communicate"  OBJECTIVE:  Note: Objective measures were completed at Evaluation unless otherwise noted. OBJECTIVE:   INSTRUMENTAL SWALLOW STUDY FINDINGS (MBSS)  Objective swallow impairments: There was frank aspiration with delayed cough response of thin liquids by cup with head in neutral position.  -No  penetration or aspiration of pudding or cracker trial. -L head turn significantly aided airway protection with thin liquids. While it did not completely eliminate aspiration risk, the amount of penetration/aspiration was significantly reduced. Continued to observe deep, stagnant penetration of thin liquids (in isolation) with use of L head turn.  -L Head turn plus Chin tuck resulted in trace silent aspiration of thin liquids. Chin tuck not recommended.  Objective recommended compensations:    -Regular diet  -Thin liquids with L Head Turn -PO meds whole in pudding or applesauce -Consider nectar-thick liquids if Mr. Percle has difficulty remembering L head turn strategy or if coughing with thin liquids becomes bothersome.  -Follow up with OP SLP    COGNITION: Overall cognitive status: Impaired Areas of impairment:  Attention: Impaired: Alternating, Divided Awareness: Impaired: Anticipatory Functional deficits:   SUBJECTIVE DYSPHAGIA REPORTS:  Date of onset: 07/28/22 Reported symptoms: coughing with liquids and choking with liquids  Current diet: regular and thin liquids  Co-morbid voice changes: No  FACTORS WHICH MAY INCREASE RISK OF ADVERSE EVENT IN PRESENCE OF ASPIRATION:  General health: poor general health  Risk factors: none evident     ORAL MOTOR EXAMINATION: Overall status: Impaired:   Labial: Bilateral (Coordination) Lingual: Bilateral (Coordination) Velum: Coordination Comments:   CLINICAL SWALLOW ASSESSMENT:   Dentition: adequate natural dentition Vocal quality at baseline: normal Patient directly observed with POs: Yes: thin liquids  Feeding: able to feed self Liquids provided by:  coke bottle [facility] Swallow Protocol:  n/a Oral phase signs and symptoms: prolonged bolus formation Pharyngeal phase signs and symptoms: immediate cough                                                                                                                                TREATMENT DATE:   09/07/23: Eval completed -   Swallow:Initiated training in HEP for dysphagia - pt required frequent mod A for effortful swallow, masako, CTAR - unable to perform Mendelson with max A. [place] states "I can't swallow 5x in a row." Reviewed swallowing precautions - with verbal cues Tiara demonstrated head turn left 5/5 sips and verbalized effortful swallow. Effortful swallow employed to support mindfulness and intent when swallowing. He states he forgets to turn his head left - we generated strategies of placing signs to remind him, placing TV to the left  when he eats (although I recommend eliminating distractions with meals)and eating at a table. He reports biting his cheeks and tongue during meals. Instructed in using strategy of chewing and swallowing with intent to improve awareness. Education provided on using head turn and swallowing with intent with mixed consistencies as he does eat cereal and soup regularly. Speech: initiated training in strategies to support intelligibility and expression including saying Liz's name and making sure he gets her attention before he starts speaking, as well as saying 1`-2 key words rather than pointing.    PATIENT EDUCATION: Education details: See Treatment above; HEP for dysphagia; swallow precautions; diet modifications; communication strategies Person educated: Patient and Spouse Education method: Explanation, Demonstration, Verbal cues, and Handouts Education comprehension: verbalized understanding, returned demonstration, verbal cues required, and needs further education   ASSESSMENT:  CLINICAL IMPRESSION: Patient is a 63 y.o. male who was seen today for mild to moderate oropharyngeal dysphagia; mild ataxic dysarthria; mild cognitive communication impairment. He is well known to us  from prior course of outpatient ST May and June of 2024. He has not carried over recommendation for head turn left and reports increased coughing with meals.  [name] worries about him choking when she is not home. He is eating only soft foods when she is at work. Tyberius reports he is having difficulty communicating quickly and [name] endorses he is not talking as much, but pointing, even to get her attention. She states that he just starts talking and she doesn't know who he is talking to and she has to request information be repeated. Overall poor anticipatory awareness in lack of carrying over head turn left, not getting attention before speaking, biting his cheeks and tongue. I recommend skilled ST to maximize safety of swallow, intelligibility, verbal expression for safety, independence and to reduce caregiver burden  OBJECTIVE IMPAIRMENTS: include executive functioning, expressive language, dysarthria, and dysphagia. These impairments are limiting patient from return to work, managing medications, managing finances, ADLs/IADLs, effectively communicating at home and in community, and safety when swallowing. Factors affecting potential to achieve goals and functional outcome are cooperation/participation level. Patient will benefit from skilled SLP services to address above impairments and improve overall function.  REHAB POTENTIAL: Fair due to time post onset, cognitive impairments   GOALS: Goals reviewed with patient? Yes  SHORT TERM GOALS: Target date: 11/12/23 (extended due to scheduling)  Pt will complete HEP for dysphagia with rare min A  Baseline: Goal status: INITIAL  2.  Pt will follow swallow precautions and diet modifications with rare min A Baseline:  Goal status: INITIAL  3.  Pt will verbally get person's attention and eye contact prior to speaking with rare min A Baseline:  Goal status: INITIAL  4.  Pt will report 50% reduced biting of his cheek and tongue while eating by chewing and swallowing with intent  Baseline:  Goal status: INITIAL    LONG TERM GOALS: Target date: 12/02/23  Pt will complete HEP at least 1x a day for 1  week Baseline:  Goal status: INITIAL  2.  Pt will carryover swallow precautions including eating with intent and head turn left with mod I Baseline:  Goal status: INITIAL  3.  Spouse will report 50% need to request repetition or clarification when Carlo is talking Baseline:  Goal status: INITIAL  4.  Pt and spouse will carryover 2 compensatory strategies including environmental modifications to support processing and understanding of conversations Baseline:  Goal status: INITIAL   PLAN:  SLP FREQUENCY: 2x/week  SLP DURATION: 6 weeks - POC dates extended due to scheduling  PLANNED INTERVENTIONS: Aspiration precaution training, Pharyngeal strengthening exercises, Diet toleration management , Language facilitation, Environmental controls, Trials of upgraded texture/liquids, Cueing hierachy, Cognitive reorganization, Internal/external aids, Functional tasks, Multimodal communication approach, SLP instruction and feedback, Compensatory strategies, Patient/family education, [phone] Treatment of speech (30 or 45 min) , and [id number] Treatment of swallowing function    Kristen Fromm, Dareen Ebbing, CCC-SLP 09/07/2023, 4:05 PM

## 2023-09-08 ENCOUNTER — Ambulatory Visit (INDEPENDENT_AMBULATORY_CARE_PROVIDER_SITE_OTHER): Admitting: Psychology

## 2023-09-08 DIAGNOSIS — F329 Major depressive disorder, single episode, unspecified: Secondary | ICD-10-CM

## 2023-09-08 DIAGNOSIS — F321 Major depressive disorder, single episode, moderate: Secondary | ICD-10-CM

## 2023-09-08 NOTE — Progress Notes (Signed)
 [facility] Counselor Initial Adult Exam  Name: Christopher Yu Date: 09/08/2023 MRN: [id number] DOB: 04/19/1961 PCP: Christopher Feeling, MD    Guardian/Payee:  N/A    Paperwork requested: No   Reason for Visit Christopher Yu Problem: Adjustment to serious medical illness  Mental Status Exam: Appearance:   Casual     Behavior:  Appropriate  Motor:  Shuffling Gait  Speech/Language:   Slurred  Affect:  Depressed  Mood:  depressed  Thought process:  normal  Thought content:    WNL  Sensory/Perceptual disturbances:    WNL  Orientation:  oriented to person, place, and situation  Attention:  Good  Concentration:  Good  Memory:  WNL  Fund of knowledge:   Good  Insight:    Good  Judgment:   unknown  Impulse Control:  unknown    Reported Symptoms:  Sadness, helplessness and hopelessness  Risk Assessment: Danger to Self:  No Self-injurious Behavior: No Danger to Others: No Duty to Warn:no Physical Aggression / Violence:No  Access to Firearms a concern:  Unknown Gang Involvement:No  Patient / guardian was educated about steps to take if suicide or homicide risk level increases between visits: n/a While future psychiatric events cannot be accurately predicted, the patient does not currently require acute inpatient psychiatric care and does not currently meet Christopher Yu  involuntary commitment criteria.  Substance Abuse History: Current substance abuse: No     Past Psychiatric History:   No previous psychological problems have been observed Outpatient Providers:N/A History of Psych Hospitalization: No  Psychological Testing:  none    Abuse History:  Victim of:   N/A   Report needed: No. Victim of Neglect:No. Perpetrator of  N/A   Witness / Exposure to Domestic Violence: No   Protective Services Involvement: No  Witness to  [employer] Violence:  No   Family History:  Family History  Problem Relation Age of Onset   Other Father        MVA   Lupus Sister     Living situation: the patient lives with their family  Sexual Orientation: Straight  Relationship Status: married  Name of spouse Christopher Yu If a parent, number of children / ages:Two girls, ages 37 and 39  Support Systems: spouse  Surveyor, quantity Stress:  Yes   Income/Employment/Disability: Software engineer:  unknown  Educational History: Education:  unknown  Oncologist: unknown  Any cultural differences that may affect / interfere with treatment:  not applicable   Recreation/Hobbies: golfing  Stressors: Financial difficulties   Health problems    Strengths: Family  Barriers:  severe physical limitations that compromises activity   Legal History: Pending legal issue / charges: The patient has no significant history of legal issues. History of legal issue / charges:  N/A  Medical History/Surgical History: reviewed Past Medical History:  Diagnosis Date   Anxiety    Arthritis, rheumatoid (HCC)    dx 1988   CAD (coronary artery disease), native coronary artery    Mild LAD disease with calcification noted at cath 12//27/16    Cardiomyopathy (HCC)    Cardiomyopathy (HCC)    Chronic  systolic heart failure (HCC) 05/01/2015   Depression    Deviated nasal septum 06/25/2011   Headache    High cholesterol    Hyperlipidemia    Impingement syndrome of left shoulder 12/22/2017   Macular degeneration    Nausea and vomiting 08/28/2022   Rheumatoid aortitis    Sleep apnea    does not use cpap - UNABLE TO TOLERATE MASK   Sleep apnea in adult    deviated septum repaired, most recent sleep study was negative   Status post total right knee replacement 06/01/2015    Past Surgical History:  Procedure Laterality Date   ANKLE FUSION Right 05/05/2012   related to his arthritis   ANKLE FUSION Left  05/25/2013   related to his arthritis   ANKLE SURGERY Bilateral    APPLICATION OF CRANIAL NAVIGATION N/A 04/11/2022   Procedure: APPLICATION OF CRANIAL NAVIGATION;  Surgeon: Christopher Blonder, MD;  Location: [facility];  Service: Neurosurgery;  Laterality: N/A;   APPLICATION OF CRANIAL NAVIGATION N/A 08/08/2022   Procedure: APPLICATION OF CRANIAL NAVIGATION;  Surgeon: Christopher Blonder, MD;  Location: [facility];  Service: Neurosurgery;  Laterality: N/A;   BIOPSY  06/09/2023   Procedure: BIOPSY;  Surgeon: Genell Ken, MD;  Location: [facility];  Service: Gastroenterology;;   CARDIAC CATHETERIZATION N/A 05/01/2015   Procedure: Left Heart Cath and Coronary Angiography;  Surgeon: Arty Binning, MD;  Location: [facility];  Service: Cardiovascular;  Laterality: N/A;   COLONOSCOPY WITH PROPOFOL  N/A 06/09/2023   Procedure: COLONOSCOPY WITH PROPOFOL ;  Surgeon: Genell Ken, MD;  Location: [facility];  Service: Gastroenterology;  Laterality: N/A;   CRANIOTOMY N/A 04/11/2022   Procedure: STEREOTACTIC SUBOCCIPITAL CRANIOTOMY FOR RESECTION OF ARTERIO-VENOUS MALFORMATION;  Surgeon: Christopher Blonder, MD;  Location: [facility];  Service: Neurosurgery;  Laterality: N/A;   ESOPHAGOGASTRODUODENOSCOPY (EGD) WITH PROPOFOL  N/A 06/09/2023   Procedure: ESOPHAGOGASTRODUODENOSCOPY (EGD) WITH PROPOFOL ;  Surgeon: Genell Ken, MD;  Location: [facility];  Service: Gastroenterology;  Laterality: N/A;   FRACTURE SURGERY     left femur fracture x 3   HEMOSTASIS CLIP PLACEMENT  06/09/2023   Procedure: HEMOSTASIS CLIP PLACEMENT;  Surgeon: Genell Ken, MD;  Location: [facility];  Service: Gastroenterology;;   IR ANGIO EXTERNAL CAROTID SEL EXT CAROTID BILAT MOD SED  01/28/2022   IR ANGIO INTRA EXTRACRAN SEL INTERNAL CAROTID BILAT MOD SED  01/28/2022   IR ANGIO INTRA EXTRACRAN SEL INTERNAL CAROTID BILAT MOD SED  04/16/2022   IR ANGIO VERTEBRAL SEL VERTEBRAL BILAT MOD SED  01/28/2022   IR ANGIO VERTEBRAL SEL VERTEBRAL BILAT MOD SED   04/16/2022   KNEE ARTHROSCOPY     right x 4   LAPAROSCOPIC REVISION VENTRICULAR-PERITONEAL (V-P) SHUNT N/A 08/08/2022   Procedure: LAPAROSCOPIC VENTRICULAR-PERITONEAL (V-P) SHUNT;  Surgeon: Sim Dryer, MD;  Location: [facility];  Service: General;  Laterality: N/A;   LUMBAR LAMINECTOMY/DECOMPRESSION MICRODISCECTOMY N/A 07/18/2022   Procedure: Repair of Pseudomeningiocele posterior;  Surgeon: Christopher Blonder, MD;  Location: [facility];  Service: Neurosurgery;  Laterality: N/A;   NASAL SEPTOPLASTY W/ TURBINOPLASTY  06/25/2011   Procedure: NASAL SEPTOPLASTY WITH TURBINATE REDUCTION;  Surgeon: Ammon Bales, MD;  Location: [facility] OR;  Service: ENT;  Laterality: Bilateral;   PLACEMENT OF LUMBAR DRAIN N/A 07/18/2022   Procedure: PLACEMENT OF LUMBAR DRAIN;  Surgeon: Christopher Blonder, MD;  Location: [facility];  Service: Neurosurgery;  Laterality: N/A;   POLYPECTOMY  06/09/2023   Procedure: POLYPECTOMY;  Surgeon: Genell Ken, MD;  Location: [facility];  Service: Gastroenterology;;   TONSILLECTOMY  TOTAL KNEE ARTHROPLASTY Right 06/01/2015   Procedure: RIGHT TOTAL KNEE ARTHROPLASTY;  Surgeon: Arnie Lao, MD;  Location: [facility];  Service: Orthopedics;  Laterality: Right;  Block+general   VENTRICULOPERITONEAL SHUNT Right 08/08/2022   Procedure: LAP ASSITED SHUNT INSERTION VENTRICULAR-PERITONEAL;  Surgeon: Christopher Blonder, MD;  Location: [facility];  Service: Neurosurgery;  Laterality: Right;    Medications: Current Outpatient Medications  Medication Sig Dispense Refill   acetaminophen  (TYLENOL ) 325 MG tablet Take 2 tablets (650 mg total) by mouth 3 (three) times daily.     alendronate (FOSAMAX) 10 MG tablet Take 10 mg by mouth daily before breakfast. Take with a full glass of water  on an empty stomach.     artificial tears (LACRILUBE) OINT ophthalmic ointment Place into both eyes 2 (two) times daily.     aspirin  EC 81 MG tablet Take 1 tablet (81 mg total) by mouth daily. Swallow whole. 30 tablet 12    calcium  carbonate (TUMS - DOSED IN MG ELEMENTAL CALCIUM ) 500 MG chewable tablet Chew 1 tablet (200 mg of elemental calcium  total) by mouth daily with supper.     Cholecalciferol (VITAMIN D3) 50 MCG (2000 UT) TABS Take 2,000 Units by mouth daily.     famotidine (PEPCID) 40 MG tablet 1 tablet Orally Once a day for 30 days     folic acid  (FOLVITE ) 1 MG tablet Take 1 mg by mouth daily.     methotrexate  (RHEUMATREX) 2.5 MG tablet Take 20 mg by mouth once a week. Takes 8 tablets(20mg ) every Saturday.     Caution:Chemotherapy. Protect from light.     metoprolol  succinate (TOPROL -XL) 25 MG 24 hr tablet TAKE 1 TABLET DAILY 90 tablet 3   Multiple Vitamins-Minerals (PRESERVISION AREDS 2 PO) Take 2 capsules by mouth daily.     predniSONE  (STERAPRED UNI-PAK 21 TAB) 5 MG (21) TBPK tablet Take 5 mg by mouth every morning.     RITUXAN  500 MG/50ML injection See admin instructions. Every 6 months 2 dose infusion Ruxience      rosuvastatin  (CRESTOR ) 20 MG tablet TAKE 1 TABLET DAILY (DOSE INCREASE) 90 tablet 2   sertraline  (ZOLOFT ) 20 MG/ML concentrated solution Take 25 mg by mouth daily.     spironolactone  (ALDACTONE ) 25 MG tablet 12.5 mg. 1/2 tab daily     sulfaSALAzine  (AZULFIDINE ) 500 MG tablet Take 1,000 mg by mouth 2 (two) times daily.     traMADol  (ULTRAM ) 50 MG tablet Take 1 tablet (50 mg total) by mouth every 12 (twelve) hours as needed for moderate pain. (Patient taking differently: Take 50 mg by mouth as needed for moderate pain (pain score 4-6).) 60 tablet 2   traZODone  (DESYREL ) 50 MG tablet TAKE 1.5 TABLETS (75 MG TOTAL) BY MOUTH AT BEDTIME AS NEEDED. FOR SLEEP 135 tablet 5   White Petrolatum -Mineral Oil (GENTEAL TEARS NIGHT-TIME OP) Apply to eye at bedtime.     No current facility-administered medications for this visit.    Allergies  Allergen Reactions   Hydroxychloroquine      Other Reaction(s): cardiolmyopathy    Diagnoses:  Major depression  Plan of Care: Outpatient psychotherapy and  medication management as needed. Initial Note: (With wife Christopher Yu). She says that she has hoped Jayne would get into therapy. He has Ataxic Tremor. He told Dr. Winferd Hatter that he would like medication to just sit and do nothing. He says he always had golf to rey on and now cannot play. In December, he went n for a surgery to remove an avm from brain. Was  told it was a routine surgery and was told that he needs to get it out. Was told it would be a short recovery and he would be back to work. 1 1/2 hour surgery turned into 6 hour surgery and he had complications. He had extra cerebral fluid and Dr. Arnetta Lank it would absorb and just wait. He kept declining and by March, they went in to try and drain the extra fluid. He was in hospital for a week. Wife noticed that during the hospital stay, his speech declined and he was clearly weak. Nurses were trying to discharge him and she protested, but they sent him home anyway. He declined, and could not sit, stand, talk or walk and she ended up calling ambulance. In ER, she ws told he had a stroke. He was in ER 24 hours before getting a room in Neuro area. Head of stroke team came in and said it was not a stroke, but complications from surgery. She was blamed by medical team for not "speaking up" before being initially discharged. He ended up in a rehab for 2 weeks and told them he needed another surgery. Was told he had hydrocephalus and need a shunt to drain liquid off his brain. She felt a lot of pressure from team that he needed surgery. She wanted a second opinion and had rouble getting it. Spoke to another Dr. and they decided to get the third surgery. He was in OT,PT and Speech Therapy. During therapy, he started having a tremor. They ended up being referred to Dr. Winferd Hatter, who has now told him that treating his tremor is tricky and will take time.  Tredarius is no longer in OT,PT speech therapy. He is supposed to be doing exercises on his own, but has not been emotionally able to ge  himself to do it. He has persistent headaches as well. Just had a CAT Scan and awaiting the results. His headaches are worse and they are concerned. He also has rhumatory arthritis and it is flaring up because he had to stop meds. He has cardiomyopathy, macular degeneration on top of all of these other conditions.  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 He has been approved for disability and are working to get social security disability. She works part time. They have 2 daughters, one with diabetes and arthritis. She is heading to [employer] this Fall and wants to work in Research scientist (physical sciences). Other daughter is 27 and is "our introvert with lots of anxiety". She is in therapy and has mental health issues that is being treated. She will be a Holiday representative at the middle college at [employer].  They are told that medicine will not control his condition, but they will have to try to retrain his brain. Suggested that they work to get back into OT and PT. She has a hard time motivating him. He admits that he keeps things "inside". She says he is not a good comunicator. He will not share things like if he is in pain or hurts himself.   Tx. Plan/Goals: Patient reports depressive symptoms secondary to the medical trauma he has suffered and the debilitating physical results. He is seeking therapy to resolve his feelings of helplessness and hopelessness. Also want assistance in improving his drive/motivation in daily activity. Will engage in therapy that utilizes behavioral approaches to mitigate symptoms along with insight oriented counseling. Will also incorporate some conjoint sessions with spouse  as needed, given her integral involvement with his care. Patient agrees with plan for treatment. Goal Date is 12-25    Patient agrees to be seen in video Public affairs consultant) session and he is aware of platform limitations. He is at home and provider in his office.  Session Note:   Patient states he takes daily walks and tries to push himself further each day. He is now practicing walking without his cane. He had a swallow test Friday and speech yesterday. He has to make some accommodations, which he is confident he can do. He will start speech therapy 2x/week. His wife and girls are going to bury his father in law in June. He will be home alone with his dog for 2 weeks. He talked about learning to take a golf wing to practice before calling and getting together with friends. With his speech, he says he has to think about what he wants to say ahead of time. Otherwise people cannot understand him and try to guess what he is saying. He is learning to use his words to express himself.                                                                         Jola Nash, PhD 2:10p-3:00 50 minutes

## 2023-09-09 ENCOUNTER — Ambulatory Visit: Payer: BC Managed Care – PPO | Admitting: Psychology

## 2023-09-16 ENCOUNTER — Ambulatory Visit (INDEPENDENT_AMBULATORY_CARE_PROVIDER_SITE_OTHER): Payer: BC Managed Care – PPO | Admitting: Psychology

## 2023-09-16 DIAGNOSIS — F321 Major depressive disorder, single episode, moderate: Secondary | ICD-10-CM | POA: Diagnosis not present

## 2023-09-16 NOTE — Progress Notes (Signed)
 [facility] Counselor Initial Adult Exam  Name: Christopher Yu Date: 09/16/2023 MRN: [id number] DOB: [birth year] PCP: Tena Feeling, MD    Guardian/Payee:  N/A    Paperwork requested: No   Reason for Visit Christopher Yu Problem: Adjustment to serious medical illness  Mental Status Exam: Appearance:   Casual     Behavior:  Appropriate  Motor:  Shuffling Gait  Speech/Language:   Slurred  Affect:  Depressed  Mood:  depressed  Thought process:  normal  Thought content:    WNL  Sensory/Perceptual disturbances:    WNL  Orientation:  oriented to person, place, and situation  Attention:  Good  Concentration:  Good  Memory:  WNL  Fund of knowledge:   Good  Insight:    Good  Judgment:   unknown  Impulse Control:  unknown    Reported Symptoms:  Sadness, helplessness and hopelessness  Risk Assessment: Danger to Self:  No Self-injurious Behavior: No Danger to Others: No Duty to Warn:no Physical Aggression / Violence:No  Access to Firearms a concern: Unknown Gang Involvement:No  Patient / guardian was educated about steps to take if suicide or homicide risk level increases between visits: n/a While future psychiatric events cannot be accurately predicted, the patient does not currently require acute inpatient psychiatric care and does not currently meet [place]  involuntary commitment criteria.  Substance Abuse History: Current substance abuse: No     Past Psychiatric History:   No previous psychological problems have been observed Outpatient Providers:N/A History of Psych Hospitalization: No  Psychological Testing: none   Abuse History:  Victim of:  N/A  Report needed: No. Victim of Neglect:No. Perpetrator of N/A  Witness / Exposure to Domestic Violence: No   Protective Services  Involvement: No  Witness to [employer] Violence:  No   Family History:  Family History  Problem Relation Age of Onset   Other Father        MVA   Lupus Sister     Living situation: the patient lives with their family  Sexual Orientation: Straight  Relationship Status: married  Name of spouse Christopher Yu If a parent, number of children / ages:Two girls, ages 72 and 73  Support Systems: spouse  Surveyor, quantity Stress:  Yes   Income/Employment/Disability: Software engineer: unknown  Educational History: Education: unknown  Oncologist: unknown  Any cultural differences that may affect / interfere with treatment:  not applicable   Recreation/Hobbies: golfing  Stressors: Financial difficulties   Health problems    Strengths: Family  Barriers:  severe physical limitations that compromises activity   Legal History: Pending legal issue / charges: The patient has no significant history of legal issues. History of legal issue / charges: N/A  Medical History/Surgical History: reviewed Past Medical History:  Diagnosis Date   Anxiety    Arthritis, rheumatoid (HCC)    dx 1988   CAD (coronary artery disease), native coronary artery    Mild LAD disease with calcification noted at cath 12//27/16    Cardiomyopathy [facility])    Cardiomyopathy (  HCC)    Chronic systolic heart failure (HCC) 05/01/2015   Depression    Deviated nasal septum 06/25/2011   Headache    High cholesterol    Hyperlipidemia    Impingement syndrome of left shoulder 12/22/2017   Macular degeneration    Nausea and vomiting 08/28/2022   Rheumatoid aortitis    Sleep apnea    does not use cpap - UNABLE TO TOLERATE MASK   Sleep apnea in adult    deviated septum repaired, most recent sleep study was negative   Status post total right knee replacement 06/01/2015    Past Surgical History:  Procedure Laterality Date   ANKLE FUSION Right 05/05/2012   related to his arthritis    ANKLE FUSION Left 05/25/2013   related to his arthritis   ANKLE SURGERY Bilateral    APPLICATION OF CRANIAL NAVIGATION N/A 04/11/2022   Procedure: APPLICATION OF CRANIAL NAVIGATION;  Surgeon: Augusto Blonder, MD;  Location: [facility] OR;  Service: Neurosurgery;  Laterality: N/A;   APPLICATION OF CRANIAL NAVIGATION N/A 08/08/2022   Procedure: APPLICATION OF CRANIAL NAVIGATION;  Surgeon: Augusto Blonder, MD;  Location: [facility] OR;  Service: Neurosurgery;  Laterality: N/A;   BIOPSY  06/09/2023   Procedure: BIOPSY;  Surgeon: Genell Ken, MD;  Location: [facility];  Service: Gastroenterology;;   CARDIAC CATHETERIZATION N/A 05/01/2015   Procedure: Left Heart Cath and Coronary Angiography;  Surgeon: Arty Binning, MD;  Location: [facility] INVASIVE CV LAB;  Service: Cardiovascular;  Laterality: N/A;   COLONOSCOPY WITH PROPOFOL  N/A 06/09/2023   Procedure: COLONOSCOPY WITH PROPOFOL ;  Surgeon: Genell Ken, MD;  Location: [facility];  Service: Gastroenterology;  Laterality: N/A;   CRANIOTOMY N/A 04/11/2022   Procedure: STEREOTACTIC SUBOCCIPITAL CRANIOTOMY FOR RESECTION OF ARTERIO-VENOUS MALFORMATION;  Surgeon: Augusto Blonder, MD;  Location: [facility];  Service: Neurosurgery;  Laterality: N/A;   ESOPHAGOGASTRODUODENOSCOPY (EGD) WITH PROPOFOL  N/A 06/09/2023   Procedure: ESOPHAGOGASTRODUODENOSCOPY (EGD) WITH PROPOFOL ;  Surgeon: Genell Ken, MD;  Location: [facility];  Service: Gastroenterology;  Laterality: N/A;   FRACTURE SURGERY     left femur fracture x 3   HEMOSTASIS CLIP PLACEMENT  06/09/2023   Procedure: HEMOSTASIS CLIP PLACEMENT;  Surgeon: Genell Ken, MD;  Location: [facility];  Service: Gastroenterology;;   IR ANGIO EXTERNAL CAROTID SEL EXT CAROTID BILAT MOD SED  01/28/2022   IR ANGIO INTRA EXTRACRAN SEL INTERNAL CAROTID BILAT MOD SED  01/28/2022   IR ANGIO INTRA EXTRACRAN SEL INTERNAL CAROTID BILAT MOD SED  04/16/2022   IR ANGIO VERTEBRAL SEL VERTEBRAL BILAT MOD SED  01/28/2022   IR ANGIO VERTEBRAL SEL  VERTEBRAL BILAT MOD SED  04/16/2022   KNEE ARTHROSCOPY     right x 4   LAPAROSCOPIC REVISION VENTRICULAR-PERITONEAL (V-P) SHUNT N/A 08/08/2022   Procedure: LAPAROSCOPIC VENTRICULAR-PERITONEAL (V-P) SHUNT;  Surgeon: Sim Dryer, MD;  Location: [facility];  Service: General;  Laterality: N/A;   LUMBAR LAMINECTOMY/DECOMPRESSION MICRODISCECTOMY N/A 07/18/2022   Procedure: Repair of Pseudomeningiocele posterior;  Surgeon: Augusto Blonder, MD;  Location: [facility];  Service: Neurosurgery;  Laterality: N/A;   NASAL SEPTOPLASTY W/ TURBINOPLASTY  06/25/2011   Procedure: NASAL SEPTOPLASTY WITH TURBINATE REDUCTION;  Surgeon: Ammon Bales, MD;  Location: [facility] OR;  Service: ENT;  Laterality: Bilateral;   PLACEMENT OF LUMBAR DRAIN N/A 07/18/2022   Procedure: PLACEMENT OF LUMBAR DRAIN;  Surgeon: Augusto Blonder, MD;  [facility];  Service: Neurosurgery;  Laterality: N/A;   POLYPECTOMY  06/09/2023   Procedure: POLYPECTOMY;  Surgeon: Genell Ken, MD;  [facility];  Service: Gastroenterology;;   TONSILLECTOMY     TOTAL KNEE ARTHROPLASTY Right 06/01/2015   Procedure: RIGHT TOTAL KNEE ARTHROPLASTY;  Surgeon: Arnie Lao, MD;  Location: [facility];  Service: Orthopedics;  Laterality: Right;  Block+general   VENTRICULOPERITONEAL SHUNT Right 08/08/2022   Procedure: LAP ASSITED SHUNT INSERTION VENTRICULAR-PERITONEAL;  Surgeon: Augusto Blonder, MD;  Location: [facility];  Service: Neurosurgery;  Laterality: Right;    Medications: Current Outpatient Medications  Medication Sig Dispense Refill   acetaminophen  (TYLENOL ) 325 MG tablet Take 2 tablets (650 mg total) by mouth 3 (three) times daily.     alendronate (FOSAMAX) 10 MG tablet Take 10 mg by mouth daily before breakfast. Take with a full glass of water  on an empty stomach.     artificial tears (LACRILUBE) OINT ophthalmic ointment Place into both eyes 2 (two) times daily.     aspirin  EC 81 MG tablet Take 1 tablet (81 mg total) by mouth daily. Swallow  whole. 30 tablet 12   calcium  carbonate (TUMS - DOSED IN MG ELEMENTAL CALCIUM ) 500 MG chewable tablet Chew 1 tablet (200 mg of elemental calcium  total) by mouth daily with supper.     Cholecalciferol (VITAMIN D3) 50 MCG (2000 UT) TABS Take 2,000 Units by mouth daily.     famotidine (PEPCID) 40 MG tablet 1 tablet Orally Once a day for 30 days     folic acid  (FOLVITE ) 1 MG tablet Take 1 mg by mouth daily.     methotrexate  (RHEUMATREX) 2.5 MG tablet Take 20 mg by mouth once a week. Takes 8 tablets(20mg ) every Saturday.     Caution:Chemotherapy. Protect from light.     metoprolol  succinate (TOPROL -XL) 25 MG 24 hr tablet TAKE 1 TABLET DAILY 90 tablet 3   Multiple Vitamins-Minerals (PRESERVISION AREDS 2 PO) Take 2 capsules by mouth daily.     predniSONE  (STERAPRED UNI-PAK 21 TAB) 5 MG (21) TBPK tablet Take 5 mg by mouth every morning.     RITUXAN  500 MG/50ML injection See admin instructions. Every 6 months 2 dose infusion Ruxience      rosuvastatin  (CRESTOR ) 20 MG tablet TAKE 1 TABLET DAILY (DOSE INCREASE) 90 tablet 2   sertraline  (ZOLOFT ) 20 MG/ML concentrated solution Take 25 mg by mouth daily.     spironolactone  (ALDACTONE ) 25 MG tablet 12.5 mg. 1/2 tab daily     sulfaSALAzine  (AZULFIDINE ) 500 MG tablet Take 1,000 mg by mouth 2 (two) times daily.     traMADol  (ULTRAM ) 50 MG tablet Take 1 tablet (50 mg total) by mouth every 12 (twelve) hours as needed for moderate pain. (Patient taking differently: Take 50 mg by mouth as needed for moderate pain (pain score 4-6).) 60 tablet 2   traZODone  (DESYREL ) 50 MG tablet TAKE 1.5 TABLETS (75 MG TOTAL) BY MOUTH AT BEDTIME AS NEEDED. FOR SLEEP 135 tablet 5   White Petrolatum -Mineral Oil (GENTEAL TEARS NIGHT-TIME OP) Apply to eye at bedtime.     No current facility-administered medications for this visit.    Allergies  Allergen Reactions   Hydroxychloroquine      Other Reaction(s): cardiolmyopathy    Diagnoses:  Major depression  Plan of Care: Outpatient  psychotherapy and medication management as needed. Initial Note: (With wife Christopher Yu). She says that she has hoped Charlston would get into therapy. He has Ataxic Tremor. He told Dr. Winferd Hatter that he would like medication to just sit and do nothing. He says he always had golf to rey on and now cannot play. In December, he went n for  a surgery to remove an avm from brain. Was told it was a routine surgery and was told that he needs to get it out. Was told it would be a short recovery and he would be back to work. 1 1/2 hour surgery turned into 6 hour surgery and he had complications. He had extra cerebral fluid and Dr. Arnetta Lank it would absorb and just wait. He kept declining and by March, they went in to try and drain the extra fluid. He was in hospital for a week. Wife noticed that during the hospital stay, his speech declined and he was clearly weak. Nurses were trying to discharge him and she protested, but they sent him home anyway. He declined, and could not sit, stand, talk or walk and she ended up calling ambulance. In ER, she ws told he had a stroke. He was in ER 24 hours before getting a room in Neuro area. Head of stroke team came in and said it was not a stroke, but complications from surgery. She was blamed by medical team for not "speaking up" before being initially discharged. He ended up in a rehab for 2 weeks and told them he needed another surgery. Was told he had hydrocephalus and need a shunt to drain liquid off his brain. She felt a lot of pressure from team that he needed surgery. She wanted a second opinion and had rouble getting it. Spoke to another Dr. and they decided to get the third surgery. He was in OT,PT and Speech Therapy. During therapy, he started having a tremor. They ended up being referred to Dr. Winferd Hatter, who has now told him that treating his tremor is tricky and will take time.  Lawernce is no longer in OT,PT speech therapy. He is supposed to be doing exercises on his own, but has not been  emotionally able to ge himself to do it. He has persistent headaches as well. Just had a CAT Scan and awaiting the results. His headaches are worse and they are concerned. He also has rhumatory arthritis and it is flaring up because he had to stop meds. He has cardiomyopathy, macular degeneration on top of all of these other conditions.  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 He has been approved for disability and are working to get social security disability. She works part time. They have 2 daughters, one with diabetes and arthritis. She is heading to [employer] this Fall and wants to work in Research scientist (physical sciences). Other daughter is 50 and is "our introvert with lots of anxiety". She is in therapy and has mental health issues that is being treated. She will be a Holiday representative at the middle college at [employer].  They are told that medicine will not control his condition, but they will have to try to retrain his brain. Suggested that they work to get back into OT and PT. She has a hard time motivating him. He admits that he keeps things "inside". She says he is not a good comunicator. He will not share things like if he is in pain or hurts himself.   Tx. Plan/Goals: Patient reports depressive symptoms secondary to the medical trauma he has suffered and the debilitating physical results. He is seeking therapy to resolve his feelings of helplessness and hopelessness. Also want assistance in improving his drive/motivation in daily activity. Will engage in therapy that utilizes behavioral approaches to mitigate symptoms along with insight oriented  counseling. Will also incorporate some conjoint sessions with spouse as needed, given her integral involvement with his care. Patient agrees with plan for treatment. Goal Date is 12-25    Patient agrees to be seen in therapist's office.  Session Note:  Patient states he is getting work done around house and trying to  get estimates on work to be done. He is hoping that his wife will support his desire to build a pool at home. He wants to know more about anxiety. He talked about his experiences with both anxiety and depression. He hopes to reduce his list of medications. His greatest his anxiety is when he goes out of his "comfort zone". He prefers to stay in his zone, which involves fewer people. He holds back now in ways that he did not used to hold back. Will talk about behavioral strategies/alternatives to manage his anxiety.                                                                               Jola Nash, PhD 4:10p-5:00 50 minutes

## 2023-09-22 ENCOUNTER — Encounter: Payer: Self-pay | Admitting: Physical Medicine and Rehabilitation

## 2023-09-22 ENCOUNTER — Ambulatory Visit (INDEPENDENT_AMBULATORY_CARE_PROVIDER_SITE_OTHER): Admitting: Psychology

## 2023-09-22 DIAGNOSIS — F329 Major depressive disorder, single episode, unspecified: Secondary | ICD-10-CM | POA: Diagnosis not present

## 2023-09-22 DIAGNOSIS — F321 Major depressive disorder, single episode, moderate: Secondary | ICD-10-CM

## 2023-09-22 NOTE — Progress Notes (Signed)
 [facility] Counselor Initial Adult Exam  Name: Christopher Yu Date: 09/22/2023 MRN: [id number] DOB: 1960-11-12 PCP: Tena Feeling, MD    Guardian/Payee:  N/A    Paperwork requested: No   Reason for Visit [name] Problem: Adjustment to serious medical illness  Mental Status Exam: Appearance:   Casual     Behavior:  Appropriate  Motor:  Shuffling Gait  Speech/Language:   Slurred  Affect:  Depressed  Mood:  depressed  Thought process:  normal  Thought content:    WNL  Sensory/Perceptual disturbances:    WNL  Orientation:  oriented to person, place, and situation  Attention:  Good  Concentration:  Good  Memory:  WNL  Fund of knowledge:   Good  Insight:    Good  Judgment:   unknown  Impulse Control:  unknown    Reported Symptoms:  Sadness, helplessness and hopelessness  Risk Assessment: Danger to Self:  No Self-injurious Behavior: No Danger to Others: No Duty to Warn:no Physical Aggression / Violence:No  Access to Firearms a concern: Unknown Gang Involvement:No  Patient / guardian was educated about steps to take if suicide or homicide risk level increases between visits: n/a While future psychiatric events cannot be accurately predicted, the patient does not currently require acute inpatient psychiatric care and does not currently meet [facility]  involuntary commitment criteria.  Substance Abuse History: Current substance abuse: No     Past Psychiatric History:   No previous psychological problems have been observed Outpatient Providers:N/A History of Psych Hospitalization: No  Psychological Testing: none   Abuse History:  Victim of:  N/A  Report needed: No. Victim of Neglect:No. Perpetrator of N/A  Witness / Exposure to Domestic Violence: No    Protective Services Involvement: No  Witness to [employer] Violence:  No   Family History:  Family History  Problem Relation Age of Onset   Other Father        MVA   Lupus Sister     Living situation: the patient lives with their family  Sexual Orientation: Straight  Relationship Status: married  Name of spouse [name] If a parent, number of children / ages:Two girls, ages 75 and 33  Support Systems: spouse  Surveyor, quantity Stress:  Yes   Income/Employment/Disability: Software engineer: unknown  Educational History: Education: unknown  Oncologist: unknown  Any cultural differences that may affect / interfere with treatment:  not applicable   Recreation/Hobbies: golfing  Stressors: Financial difficulties   Health problems    Strengths: Family  Barriers:  severe physical limitations that compromises activity   Legal History: Pending legal issue / charges: The patient has no significant history of legal issues. History of legal issue / charges: N/A  Medical History/Surgical History: reviewed Past Medical History:  Diagnosis Date   Anxiety    Arthritis, rheumatoid (HCC)    dx 1988   CAD (coronary artery disease), native coronary artery    Mild LAD disease  with calcification noted at cath 12//27/16    Cardiomyopathy (HCC)    Cardiomyopathy (HCC)    Chronic systolic heart failure (HCC) 05/01/2015   Depression    Deviated nasal septum 06/25/2011   Headache    High cholesterol    Hyperlipidemia    Impingement syndrome of left shoulder 12/22/2017   Macular degeneration    Nausea and vomiting 08/28/2022   Rheumatoid aortitis    Sleep apnea    does not use cpap - UNABLE TO TOLERATE MASK   Sleep apnea in adult    deviated septum repaired, most recent sleep study was negative   Status post total right knee replacement 06/01/2015    Past Surgical History:  Procedure Laterality Date   ANKLE FUSION Right 05/05/2012    related to his arthritis   ANKLE FUSION Left 05/25/2013   related to his arthritis   ANKLE SURGERY Bilateral    APPLICATION OF CRANIAL NAVIGATION N/A 04/11/2022   Procedure: APPLICATION OF CRANIAL NAVIGATION;  Surgeon: Augusto Blonder, MD;  Location: [facility] OR;  Service: Neurosurgery;  Laterality: N/A;   APPLICATION OF CRANIAL NAVIGATION N/A 08/08/2022   Procedure: APPLICATION OF CRANIAL NAVIGATION;  Surgeon: Augusto Blonder, MD;  Location: [facility] OR;  Service: Neurosurgery;  Laterality: N/A;   BIOPSY  06/09/2023   Procedure: BIOPSY;  Surgeon: Genell Ken, MD;  Location: [facility];  Service: Gastroenterology;;   CARDIAC CATHETERIZATION N/A 05/01/2015   Procedure: Left Heart Cath and Coronary Angiography;  Surgeon: Arty Binning, MD;  Location: [facility] INVASIVE CV LAB;  Service: Cardiovascular;  Laterality: N/A;   COLONOSCOPY WITH PROPOFOL  N/A 06/09/2023   Procedure: COLONOSCOPY WITH PROPOFOL ;  Surgeon: Genell Ken, MD;  Location: [facility];  Service: Gastroenterology;  Laterality: N/A;   CRANIOTOMY N/A 04/11/2022   Procedure: STEREOTACTIC SUBOCCIPITAL CRANIOTOMY FOR RESECTION OF ARTERIO-VENOUS MALFORMATION;  Surgeon: Augusto Blonder, MD;  Location: [facility];  Service: Neurosurgery;  Laterality: N/A;   ESOPHAGOGASTRODUODENOSCOPY (EGD) WITH PROPOFOL  N/A 06/09/2023   Procedure: ESOPHAGOGASTRODUODENOSCOPY (EGD) WITH PROPOFOL ;  Surgeon: Genell Ken, MD;  Location: [facility];  Service: Gastroenterology;  Laterality: N/A;   FRACTURE SURGERY     left femur fracture x 3   HEMOSTASIS CLIP PLACEMENT  06/09/2023   Procedure: HEMOSTASIS CLIP PLACEMENT;  Surgeon: Genell Ken, MD;  Location: [facility];  Service: Gastroenterology;;   IR ANGIO EXTERNAL CAROTID SEL EXT CAROTID BILAT MOD SED  01/28/2022   IR ANGIO INTRA EXTRACRAN SEL INTERNAL CAROTID BILAT MOD SED  01/28/2022   IR ANGIO INTRA EXTRACRAN SEL INTERNAL CAROTID BILAT MOD SED  04/16/2022   IR ANGIO VERTEBRAL SEL VERTEBRAL BILAT MOD SED  01/28/2022   IR  ANGIO VERTEBRAL SEL VERTEBRAL BILAT MOD SED  04/16/2022   KNEE ARTHROSCOPY     right x 4   LAPAROSCOPIC REVISION VENTRICULAR-PERITONEAL (V-P) SHUNT N/A 08/08/2022   Procedure: LAPAROSCOPIC VENTRICULAR-PERITONEAL (V-P) SHUNT;  Surgeon: Sim Dryer, MD;  Location: [facility];  Service: General;  Laterality: N/A;   LUMBAR LAMINECTOMY/DECOMPRESSION MICRODISCECTOMY N/A 07/18/2022   Procedure: Repair of Pseudomeningiocele posterior;  Surgeon: Augusto Blonder, MD;  Location: [facility];  Service: Neurosurgery;  Laterality: N/A;   NASAL SEPTOPLASTY W/ TURBINOPLASTY  06/25/2011   Procedure: NASAL SEPTOPLASTY WITH TURBINATE REDUCTION;  Surgeon: Ammon Bales, MD;  Location: [facility] OR;  Service: ENT;  Laterality: Bilateral;   PLACEMENT OF LUMBAR DRAIN N/A 07/18/2022   Procedure: PLACEMENT OF LUMBAR DRAIN;  Surgeon: Augusto Blonder, MD;  Location: [facility];  Service: Neurosurgery;  Laterality: N/A;   POLYPECTOMY  06/09/2023   Procedure: POLYPECTOMY;  Surgeon: Genell Ken, MD;  Location: [facility];  Service: Gastroenterology;;   TONSILLECTOMY     TOTAL KNEE ARTHROPLASTY Right 06/01/2015   Procedure: RIGHT TOTAL KNEE ARTHROPLASTY;  Surgeon: Arnie Lao, MD;  Location: [facility];  Service: Orthopedics;  Laterality: Right;  Block+general   VENTRICULOPERITONEAL SHUNT Right 08/08/2022   Procedure: LAP ASSITED SHUNT INSERTION VENTRICULAR-PERITONEAL;  Surgeon: Augusto Blonder, MD;  Location: [facility];  Service: Neurosurgery;  Laterality: Right;    Medications: Current Outpatient Medications  Medication Sig Dispense Refill   acetaminophen  (TYLENOL ) 325 MG tablet Take 2 tablets (650 mg total) by mouth 3 (three) times daily.     alendronate (FOSAMAX) 10 MG tablet Take 10 mg by mouth daily before breakfast. Take with a full glass of water  on an empty stomach.     artificial tears (LACRILUBE) OINT ophthalmic ointment Place into both eyes 2 (two) times daily.     aspirin  EC 81 MG tablet Take 1 tablet (81 mg total) by  mouth daily. Swallow whole. 30 tablet 12   calcium  carbonate (TUMS - DOSED IN MG ELEMENTAL CALCIUM ) 500 MG chewable tablet Chew 1 tablet (200 mg of elemental calcium  total) by mouth daily with supper.     Cholecalciferol (VITAMIN D3) 50 MCG (2000 UT) TABS Take 2,000 Units by mouth daily.     famotidine (PEPCID) 40 MG tablet 1 tablet Orally Once a day for 30 days     folic acid  (FOLVITE ) 1 MG tablet Take 1 mg by mouth daily.     methotrexate  (RHEUMATREX) 2.5 MG tablet Take 20 mg by mouth once a week. Takes 8 tablets(20mg ) every Saturday.     Caution:Chemotherapy. Protect from light.     metoprolol  succinate (TOPROL -XL) 25 MG 24 hr tablet TAKE 1 TABLET DAILY 90 tablet 3   Multiple Vitamins-Minerals (PRESERVISION AREDS 2 PO) Take 2 capsules by mouth daily.     predniSONE  (STERAPRED UNI-PAK 21 TAB) 5 MG (21) TBPK tablet Take 5 mg by mouth every morning.     RITUXAN  500 MG/50ML injection See admin instructions. Every 6 months 2 dose infusion Ruxience      rosuvastatin  (CRESTOR ) 20 MG tablet TAKE 1 TABLET DAILY (DOSE INCREASE) 90 tablet 2   sertraline  (ZOLOFT ) 20 MG/ML concentrated solution Take 25 mg by mouth daily.     spironolactone  (ALDACTONE ) 25 MG tablet 12.5 mg. 1/2 tab daily     sulfaSALAzine  (AZULFIDINE ) 500 MG tablet Take 1,000 mg by mouth 2 (two) times daily.     traMADol  (ULTRAM ) 50 MG tablet Take 1 tablet (50 mg total) by mouth every 12 (twelve) hours as needed for moderate pain. (Patient taking differently: Take 50 mg by mouth as needed for moderate pain (pain score 4-6).) 60 tablet 2   traZODone  (DESYREL ) 50 MG tablet TAKE 1.5 TABLETS (75 MG TOTAL) BY MOUTH AT BEDTIME AS NEEDED. FOR SLEEP 135 tablet 5   White Petrolatum -Mineral Oil (GENTEAL TEARS NIGHT-TIME OP) Apply to eye at bedtime.     No current facility-administered medications for this visit.    Allergies  Allergen Reactions   Hydroxychloroquine      Other Reaction(s): cardiolmyopathy    Diagnoses:  Major depression  Plan  of Care: Outpatient psychotherapy and medication management as needed. Initial Note: (With wife [name]). She says that she has hoped Breydan would get into therapy. He has Ataxic Tremor. He told Dr. Winferd Hatter that he would like medication to just sit and do nothing. He says he always  had golf to rey on and now cannot play. In December, he went n for a surgery to remove an avm from brain. Was told it was a routine surgery and was told that he needs to get it out. Was told it would be a short recovery and he would be back to work. 1 1/2 hour surgery turned into 6 hour surgery and he had complications. He had extra cerebral fluid and Dr. Arnetta Lank it would absorb and just wait. He kept declining and by March, they went in to try and drain the extra fluid. He was in hospital for a week. Wife noticed that during the hospital stay, his speech declined and he was clearly weak. Nurses were trying to discharge him and she protested, but they sent him home anyway. He declined, and could not sit, stand, talk or walk and she ended up calling ambulance. In ER, she ws told he had a stroke. He was in ER 24 hours before getting a room in Neuro area. Head of stroke team came in and said it was not a stroke, but complications from surgery. She was blamed by medical team for not "speaking up" before being initially discharged. He ended up in a rehab for 2 weeks and told them he needed another surgery. Was told he had hydrocephalus and need a shunt to drain liquid off his brain. She felt a lot of pressure from team that he needed surgery. She wanted a second opinion and had rouble getting it. Spoke to another Dr. and they decided to get the third surgery. He was in OT,PT and Speech Therapy. During therapy, he started having a tremor. They ended up being referred to Dr. Winferd Hatter, who has now told him that treating his tremor is tricky and will take time.  Galo is no longer in OT,PT speech therapy. He is supposed to be doing exercises on his own, but  has not been emotionally able to ge himself to do it. He has persistent headaches as well. Just had a CAT Scan and awaiting the results. His headaches are worse and they are concerned. He also has rhumatory arthritis and it is flaring up because he had to stop meds. He has cardiomyopathy, macular degeneration on top of all of these other conditions.  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 He has been approved for disability and are working to get social security disability. She works part time. They have 2 daughters, one with diabetes and arthritis. She is heading to [employer] this Fall and wants to work in Research scientist (physical sciences). Other daughter is 40 and is "our introvert with lots of anxiety". She is in therapy and has mental health issues that is being treated. She will be a Holiday representative at the middle college at [employer].  They are told that medicine will not control his condition, but they will have to try to retrain his brain. Suggested that they work to get back into OT and PT. She has a hard time motivating him. He admits that he keeps things "inside". She says he is not a good comunicator. He will not share things like if he is in pain or hurts himself.   Tx. Plan/Goals: Patient reports depressive symptoms secondary to the medical trauma he has suffered and the debilitating physical results. He is seeking therapy to resolve his feelings of helplessness and hopelessness. Also want assistance in improving his drive/motivation in daily activity.  Will engage in therapy that utilizes behavioral approaches to mitigate symptoms along with insight oriented counseling. Will also incorporate some conjoint sessions with spouse as needed, given her integral involvement with his care. Patient agrees with plan for treatment. Goal Date is 12-25    Patient agrees to be seen in therapist's office.  Session Note:  Patient says he has been fighting with social  security all week. Was told that the tramadol  isn't working and he asked if he should consider a brace. He says the tremors in his left arm are causing an issue of instability. Waiting to hear back from the neurologist.                                                                                   Jola Nash, PhD 2:10p-3:00 50 minutes

## 2023-09-23 ENCOUNTER — Ambulatory Visit: Payer: BC Managed Care – PPO | Admitting: Psychology

## 2023-09-24 NOTE — Telephone Encounter (Signed)
 Please call to set up follow up appointment. Thank you!

## 2023-09-30 ENCOUNTER — Ambulatory Visit (INDEPENDENT_AMBULATORY_CARE_PROVIDER_SITE_OTHER): Payer: BC Managed Care – PPO | Admitting: Psychology

## 2023-09-30 DIAGNOSIS — F321 Major depressive disorder, single episode, moderate: Secondary | ICD-10-CM

## 2023-09-30 DIAGNOSIS — F329 Major depressive disorder, single episode, unspecified: Secondary | ICD-10-CM

## 2023-09-30 NOTE — Progress Notes (Signed)
 [facility] Counselor Initial Adult Exam  Name: Christopher Yu Date: 09/30/2023 MRN: [id number] DOB: 1960/07/05 PCP: Tena Feeling, MD    Guardian/Payee:  N/A    Paperwork requested: No   Reason for Visit [name] Problem: Adjustment to serious medical illness  Mental Status Exam: Appearance:   Casual     Behavior:  Appropriate  Motor:  Shuffling Gait  Speech/Language:   Slurred  Affect:  Depressed  Mood:  depressed  Thought process:  normal  Thought content:    WNL  Sensory/Perceptual disturbances:    WNL  Orientation:  oriented to person, place, and situation  Attention:  Good  Concentration:  Good  Memory:  WNL  Fund of knowledge:   Good  Insight:    Good  Judgment:   unknown  Impulse Control:  unknown    Reported Symptoms:  Sadness, helplessness and hopelessness  Risk Assessment: Danger to Self:  No Self-injurious Behavior: No Danger to Others: No Duty to Warn:no Physical Aggression / Violence:No  Access to Firearms a concern: Unknown Gang Involvement:No  Patient / guardian was educated about steps to take if suicide or homicide risk level increases between visits: n/a While future psychiatric events cannot be accurately predicted, the patient does not currently require acute inpatient psychiatric care and does not currently meet [place]  involuntary commitment criteria.  Substance Abuse History: Current substance abuse: No     Past Psychiatric History:   No previous psychological problems have been observed Outpatient Providers:N/A History of Psych Hospitalization: No  Psychological Testing: none   Abuse History:  Victim of:  N/A  Report needed: No. Victim of Neglect:No. Perpetrator of N/A  Witness /  Exposure to Domestic Violence: No   Protective Services Involvement: No  Witness to [employer] Violence:  No   Family History:  Family History  Problem Relation Age of Onset   Other Father        MVA   Lupus Sister     Living situation: the patient lives with their family  Sexual Orientation: Straight  Relationship Status: married  Name of spouse [name] If a parent, number of children / ages:Two girls, ages 43 and 15  Support Systems: spouse  Surveyor, quantity Stress:  Yes   Income/Employment/Disability: Software engineer: unknown  Educational History: Education: unknown  Oncologist: unknown  Any cultural differences that may affect / interfere with treatment:  not applicable   Recreation/Hobbies: golfing  Stressors: Financial difficulties   Health problems    Strengths: Family  Barriers:  severe physical limitations that compromises activity   Legal History: Pending legal issue / charges: The patient has no significant history of legal issues. History of legal issue / charges: N/A  Medical History/Surgical History: reviewed Past Medical History:  Diagnosis Date   Anxiety    Arthritis, rheumatoid (HCC)    dx 1988  CAD (coronary artery disease), native coronary artery    Mild LAD disease with calcification noted at cath 12//27/16    Cardiomyopathy (HCC)    Cardiomyopathy (HCC)    Chronic systolic heart failure (HCC) 05/01/2015   Depression    Deviated nasal septum 06/25/2011   Headache    High cholesterol    Hyperlipidemia    Impingement syndrome of left shoulder 12/22/2017   Macular degeneration    Nausea and vomiting 08/28/2022   Rheumatoid aortitis    Sleep apnea    does not use cpap - UNABLE TO TOLERATE MASK   Sleep apnea in adult    deviated septum repaired, most recent sleep study was negative   Status post total right knee replacement 06/01/2015    Past Surgical History:  Procedure Laterality Date    ANKLE FUSION Right 05/05/2012   related to his arthritis   ANKLE FUSION Left 05/25/2013   related to his arthritis   ANKLE SURGERY Bilateral    APPLICATION OF CRANIAL NAVIGATION N/A 04/11/2022   Procedure: APPLICATION OF CRANIAL NAVIGATION;  Surgeon: Augusto Blonder, MD;  Location: [facility] OR;  Service: Neurosurgery;  Laterality: N/A;   APPLICATION OF CRANIAL NAVIGATION N/A 08/08/2022   Procedure: APPLICATION OF CRANIAL NAVIGATION;  Surgeon: Augusto Blonder, MD;  Location: [facility] OR;  Service: Neurosurgery;  Laterality: N/A;   BIOPSY  06/09/2023   Procedure: BIOPSY;  Surgeon: Genell Ken, MD;  Location: [facility];  Service: Gastroenterology;;   CARDIAC CATHETERIZATION N/A 05/01/2015   Procedure: Left Heart Cath and Coronary Angiography;  Surgeon: Arty Binning, MD;  Location: [facility] INVASIVE CV LAB;  Service: Cardiovascular;  Laterality: N/A;   COLONOSCOPY WITH PROPOFOL  N/A 06/09/2023   Procedure: COLONOSCOPY WITH PROPOFOL ;  Surgeon: Genell Ken, MD;  Location: [facility];  Service: Gastroenterology;  Laterality: N/A;   CRANIOTOMY N/A 04/11/2022   Procedure: STEREOTACTIC SUBOCCIPITAL CRANIOTOMY FOR RESECTION OF ARTERIO-VENOUS MALFORMATION;  Surgeon: Augusto Blonder, MD;  Location: [facility];  Service: Neurosurgery;  Laterality: N/A;   ESOPHAGOGASTRODUODENOSCOPY (EGD) WITH PROPOFOL  N/A 06/09/2023   Procedure: ESOPHAGOGASTRODUODENOSCOPY (EGD) WITH PROPOFOL ;  Surgeon: Genell Ken, MD;  Location: [facility];  Service: Gastroenterology;  Laterality: N/A;   FRACTURE SURGERY     left femur fracture x 3   HEMOSTASIS CLIP PLACEMENT  06/09/2023   Procedure: HEMOSTASIS CLIP PLACEMENT;  Surgeon: Genell Ken, MD;  Location: [facility];  Service: Gastroenterology;;   IR ANGIO EXTERNAL CAROTID SEL EXT CAROTID BILAT MOD SED  01/28/2022   IR ANGIO INTRA EXTRACRAN SEL INTERNAL CAROTID BILAT MOD SED  01/28/2022   IR ANGIO INTRA EXTRACRAN SEL INTERNAL CAROTID BILAT MOD SED  04/16/2022   IR ANGIO VERTEBRAL SEL VERTEBRAL  BILAT MOD SED  01/28/2022   IR ANGIO VERTEBRAL SEL VERTEBRAL BILAT MOD SED  04/16/2022   KNEE ARTHROSCOPY     right x 4   LAPAROSCOPIC REVISION VENTRICULAR-PERITONEAL (V-P) SHUNT N/A 08/08/2022   Procedure: LAPAROSCOPIC VENTRICULAR-PERITONEAL (V-P) SHUNT;  Surgeon: Sim Dryer, MD;  Location: [facility];  Service: General;  Laterality: N/A;   LUMBAR LAMINECTOMY/DECOMPRESSION MICRODISCECTOMY N/A 07/18/2022   Procedure: Repair of Pseudomeningiocele posterior;  Surgeon: Augusto Blonder, MD;  Location: [facility];  Service: Neurosurgery;  Laterality: N/A;   NASAL SEPTOPLASTY W/ TURBINOPLASTY  06/25/2011   Procedure: NASAL SEPTOPLASTY WITH TURBINATE REDUCTION;  Surgeon: Ammon Bales, MD;  Location: [facility] OR;  Service: ENT;  Laterality: Bilateral;   PLACEMENT OF LUMBAR DRAIN N/A 07/18/2022   Procedure: PLACEMENT OF LUMBAR DRAIN;  Surgeon: Augusto Blonder, MD;  Location: [facility];  Service: Neurosurgery;  Laterality: N/A;   POLYPECTOMY  06/09/2023   Procedure: POLYPECTOMY;  Surgeon: Genell Ken, MD;  Location: [facility];  Service: Gastroenterology;;   TONSILLECTOMY     TOTAL KNEE ARTHROPLASTY Right 06/01/2015   Procedure: RIGHT TOTAL KNEE ARTHROPLASTY;  Surgeon: Arnie Lao, MD;  Location: [facility];  Service: Orthopedics;  Laterality: Right;  Block+general   VENTRICULOPERITONEAL SHUNT Right 08/08/2022   Procedure: LAP ASSITED SHUNT INSERTION VENTRICULAR-PERITONEAL;  Surgeon: Augusto Blonder, MD;  Location: [facility];  Service: Neurosurgery;  Laterality: Right;    Medications: Current Outpatient Medications  Medication Sig Dispense Refill   acetaminophen  (TYLENOL ) 325 MG tablet Take 2 tablets (650 mg total) by mouth 3 (three) times daily.     alendronate (FOSAMAX) 10 MG tablet Take 10 mg by mouth daily before breakfast. Take with a full glass of water  on an empty stomach.     artificial tears (LACRILUBE) OINT ophthalmic ointment Place into both eyes 2 (two) times daily.     aspirin  EC 81 MG tablet  Take 1 tablet (81 mg total) by mouth daily. Swallow whole. 30 tablet 12   calcium  carbonate (TUMS - DOSED IN MG ELEMENTAL CALCIUM ) 500 MG chewable tablet Chew 1 tablet (200 mg of elemental calcium  total) by mouth daily with supper.     Cholecalciferol (VITAMIN D3) 50 MCG (2000 UT) TABS Take 2,000 Units by mouth daily.     famotidine (PEPCID) 40 MG tablet 1 tablet Orally Once a day for 30 days     folic acid  (FOLVITE ) 1 MG tablet Take 1 mg by mouth daily.     methotrexate  (RHEUMATREX) 2.5 MG tablet Take 20 mg by mouth once a week. Takes 8 tablets(20mg ) every Saturday.     Caution:Chemotherapy. Protect from light.     metoprolol  succinate (TOPROL -XL) 25 MG 24 hr tablet TAKE 1 TABLET DAILY 90 tablet 3   Multiple Vitamins-Minerals (PRESERVISION AREDS 2 PO) Take 2 capsules by mouth daily.     predniSONE  (STERAPRED UNI-PAK 21 TAB) 5 MG (21) TBPK tablet Take 5 mg by mouth every morning.     RITUXAN  500 MG/50ML injection See admin instructions. Every 6 months 2 dose infusion Ruxience      rosuvastatin  (CRESTOR ) 20 MG tablet TAKE 1 TABLET DAILY (DOSE INCREASE) 90 tablet 2   sertraline  (ZOLOFT ) 20 MG/ML concentrated solution Take 25 mg by mouth daily.     spironolactone  (ALDACTONE ) 25 MG tablet 12.5 mg. 1/2 tab daily     sulfaSALAzine  (AZULFIDINE ) 500 MG tablet Take 1,000 mg by mouth 2 (two) times daily.     traMADol  (ULTRAM ) 50 MG tablet Take 1 tablet (50 mg total) by mouth every 12 (twelve) hours as needed for moderate pain. (Patient taking differently: Take 50 mg by mouth as needed for moderate pain (pain score 4-6).) 60 tablet 2   traZODone  (DESYREL ) 50 MG tablet TAKE 1.5 TABLETS (75 MG TOTAL) BY MOUTH AT BEDTIME AS NEEDED. FOR SLEEP 135 tablet 5   White Petrolatum -Mineral Oil (GENTEAL TEARS NIGHT-TIME OP) Apply to eye at bedtime.     No current facility-administered medications for this visit.    Allergies  Allergen Reactions   Hydroxychloroquine      Other Reaction(s): cardiolmyopathy     Diagnoses:  Major depression  Plan of Care: Outpatient psychotherapy and medication management as needed. Initial Note: (With wife [name]). She says that she has hoped Kaynan would get into therapy. He has Ataxic Tremor. He told Dr. Winferd Hatter that he  would like medication to just sit and do nothing. He says he always had golf to rey on and now cannot play. In December, he went n for a surgery to remove an avm from brain. Was told it was a routine surgery and was told that he needs to get it out. Was told it would be a short recovery and he would be back to work. 1 1/2 hour surgery turned into 6 hour surgery and he had complications. He had extra cerebral fluid and Dr. Arnetta Lank it would absorb and just wait. He kept declining and by March, they went in to try and drain the extra fluid. He was in hospital for a week. Wife noticed that during the hospital stay, his speech declined and he was clearly weak. Nurses were trying to discharge him and she protested, but they sent him home anyway. He declined, and could not sit, stand, talk or walk and she ended up calling ambulance. In ER, she ws told he had a stroke. He was in ER 24 hours before getting a room in Neuro area. Head of stroke team came in and said it was not a stroke, but complications from surgery. She was blamed by medical team for not "speaking up" before being initially discharged. He ended up in a rehab for 2 weeks and told them he needed another surgery. Was told he had hydrocephalus and need a shunt to drain liquid off his brain. She felt a lot of pressure from team that he needed surgery. She wanted a second opinion and had rouble getting it. Spoke to another Dr. and they decided to get the third surgery. He was in OT,PT and Speech Therapy. During therapy, he started having a tremor. They ended up being referred to Dr. Winferd Hatter, who has now told him that treating his tremor is tricky and will take time.  Orrie is no longer in OT,PT speech therapy. He is  supposed to be doing exercises on his own, but has not been emotionally able to ge himself to do it. He has persistent headaches as well. Just had a CAT Scan and awaiting the results. His headaches are worse and they are concerned. He also has rhumatory arthritis and it is flaring up because he had to stop meds. He has cardiomyopathy, macular degeneration on top of all of these other conditions.  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 He has been approved for disability and are working to get social security disability. She works part time. They have 2 daughters, one with diabetes and arthritis. She is heading to [employer] this Fall and wants to work in Research scientist (physical sciences). Other daughter is 3 and is "our introvert with lots of anxiety". She is in therapy and has mental health issues that is being treated. She will be a Holiday representative at the middle college at [employer].  They are told that medicine will not control his condition, but they will have to try to retrain his brain. Suggested that they work to get back into OT and PT. She has a hard time motivating him. He admits that he keeps things "inside". She says he is not a good comunicator. He will not share things like if he is in pain or hurts himself.   Tx. Plan/Goals: Patient reports depressive symptoms secondary to the medical trauma he has suffered and the debilitating physical results. He is seeking therapy to resolve his feelings of  helplessness and hopelessness. Also want assistance in improving his drive/motivation in daily activity. Will engage in therapy that utilizes behavioral approaches to mitigate symptoms along with insight oriented counseling. Will also incorporate some conjoint sessions with spouse as needed, given her integral involvement with his care. Patient agrees with plan for treatment. Goal Date is 12-25    Patient agrees to be seen in therapist's office.  Session Note:   Patient states that he was told that the tremor is treated by meds and not a brace. He did, however, order a brace for golf. He was taken off of Lexapro and changed him to Wellbutrin. This is because Garnell felt it was not working. Tapers off Lexapro for 2 weeks and started the Wellbutrin today. 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 He and wife going to beach for the weekend.                                                                                         Jola Nash, PhD 4:10p-5:00 50 minutes

## 2023-10-06 ENCOUNTER — Ambulatory Visit (INDEPENDENT_AMBULATORY_CARE_PROVIDER_SITE_OTHER): Admitting: Psychology

## 2023-10-06 DIAGNOSIS — F321 Major depressive disorder, single episode, moderate: Secondary | ICD-10-CM

## 2023-10-06 NOTE — Progress Notes (Signed)
 [facility] Counselor Initial Adult Exam  Name: Christopher Yu Date: 10/06/2023 MRN: [id number] DOB: 11-19-1960 PCP: Tena Feeling, MD    Guardian/Payee:  N/A    Paperwork requested: No   Reason for Visit Christopher Yu Problem: Adjustment to serious medical illness  Mental Status Exam: Appearance:   Casual     Behavior:  Appropriate  Motor:  Shuffling Gait  Speech/Language:   Slurred  Affect:  Depressed  Mood:  depressed  Thought process:  normal  Thought content:    WNL  Sensory/Perceptual disturbances:    WNL  Orientation:  oriented to person, place, and situation  Attention:  Good  Concentration:  Good  Memory:  WNL  Fund of knowledge:   Good  Insight:    Good  Judgment:   unknown  Impulse Control:  unknown    Reported Symptoms:  Sadness, helplessness and hopelessness  Risk Assessment: Danger to Self:  No Self-injurious Behavior: No Danger to Others: No Duty to Warn:no Physical Aggression / Violence:No  Access to Firearms a concern: Unknown Gang Involvement:No  Patient / guardian was educated about steps to take if suicide or homicide risk level increases between visits: n/a While future psychiatric events cannot be accurately predicted, the patient does not currently require acute inpatient psychiatric care and does not currently meet [place]  involuntary commitment criteria.  Substance Abuse History: Current substance abuse: No     Past Psychiatric History:   No previous psychological problems have been observed Outpatient Providers:N/A History of Psych Hospitalization: No  Psychological Testing: none   Abuse History:  Victim of:  N/A  Report needed: No. Victim of  Neglect:No. Perpetrator of N/A  Witness / Exposure to Domestic Violence: No   Protective Services Involvement: No  Witness to [employer] Violence:  No   Family History:  Family History  Problem Relation Age of Onset   Other Father        MVA   Lupus Sister     Living situation: the patient lives with their family  Sexual Orientation: Straight  Relationship Status: married  Name of spouse [name] If a parent, number of children / ages:Two girls, ages 18 and 34  Support Systems: spouse  Surveyor, quantity Stress:  Yes   Income/Employment/Disability: Software engineer: unknown  Educational History: Education: unknown  Oncologist: unknown  Any cultural differences that may affect / interfere with treatment:  not applicable   Recreation/Hobbies: golfing  Stressors: Financial difficulties   Health problems    Strengths: Family  Barriers:  severe physical limitations that compromises activity   Legal History: Pending legal issue / charges: The patient has no significant history of legal issues. History of legal issue / charges: N/A  Medical History/Surgical History: reviewed Past Medical History:  Diagnosis  Date   Anxiety    Arthritis, rheumatoid (HCC)    dx 1988   CAD (coronary artery disease), native coronary artery    Mild LAD disease with calcification noted at cath 12//27/16    Cardiomyopathy (HCC)    Cardiomyopathy (HCC)    Chronic systolic heart failure (HCC) 05/01/2015   Depression    Deviated nasal septum 06/25/2011   Headache    High cholesterol    Hyperlipidemia    Impingement syndrome of left shoulder 12/22/2017   Macular degeneration    Nausea and vomiting 08/28/2022   Rheumatoid aortitis    Sleep apnea    does not use cpap - UNABLE TO TOLERATE MASK   Sleep apnea in adult    deviated septum repaired, most recent sleep study was negative   Status post total right knee replacement 06/01/2015    Past Surgical  History:  Procedure Laterality Date   ANKLE FUSION Right 05/05/2012   related to his arthritis   ANKLE FUSION Left 05/25/2013   related to his arthritis   ANKLE SURGERY Bilateral    APPLICATION OF CRANIAL NAVIGATION N/A 04/11/2022   Procedure: APPLICATION OF CRANIAL NAVIGATION;  Surgeon: Augusto Blonder, MD;  Location: [facility] OR;  Service: Neurosurgery;  Laterality: N/A;   APPLICATION OF CRANIAL NAVIGATION N/A 08/08/2022   Procedure: APPLICATION OF CRANIAL NAVIGATION;  Surgeon: Augusto Blonder, MD;  Location: [facility] OR;  Service: Neurosurgery;  Laterality: N/A;   BIOPSY  06/09/2023   Procedure: BIOPSY;  Surgeon: Genell Ken, MD;  Location: [facility];  Service: Gastroenterology;;   CARDIAC CATHETERIZATION N/A 05/01/2015   Procedure: Left Heart Cath and Coronary Angiography;  Surgeon: Arty Binning, MD;  Location: [facility] INVASIVE CV LAB;  Service: Cardiovascular;  Laterality: N/A;   COLONOSCOPY WITH PROPOFOL  N/A 06/09/2023   Procedure: COLONOSCOPY WITH PROPOFOL ;  Surgeon: Genell Ken, MD;  Location: [facility];  Service: Gastroenterology;  Laterality: N/A;   CRANIOTOMY N/A 04/11/2022   Procedure: STEREOTACTIC SUBOCCIPITAL CRANIOTOMY FOR RESECTION OF ARTERIO-VENOUS MALFORMATION;  Surgeon: Augusto Blonder, MD;  Location: [facility] OR;  Service: Neurosurgery;  Laterality: N/A;   ESOPHAGOGASTRODUODENOSCOPY (EGD) WITH PROPOFOL  N/A 06/09/2023   Procedure: ESOPHAGOGASTRODUODENOSCOPY (EGD) WITH PROPOFOL ;  Surgeon: Genell Ken, MD;  Location: [facility];  Service: Gastroenterology;  Laterality: N/A;   FRACTURE SURGERY     left femur fracture x 3   HEMOSTASIS CLIP PLACEMENT  06/09/2023   Procedure: HEMOSTASIS CLIP PLACEMENT;  Surgeon: Genell Ken, MD;  Location: [facility];  Service: Gastroenterology;;   IR ANGIO EXTERNAL CAROTID SEL EXT CAROTID BILAT MOD SED  01/28/2022   IR ANGIO INTRA EXTRACRAN SEL INTERNAL CAROTID BILAT MOD SED  01/28/2022   IR ANGIO INTRA EXTRACRAN SEL INTERNAL CAROTID BILAT MOD SED   04/16/2022   IR ANGIO VERTEBRAL SEL VERTEBRAL BILAT MOD SED  01/28/2022   IR ANGIO VERTEBRAL SEL VERTEBRAL BILAT MOD SED  04/16/2022   KNEE ARTHROSCOPY     right x 4   LAPAROSCOPIC REVISION VENTRICULAR-PERITONEAL (V-P) SHUNT N/A 08/08/2022   Procedure: LAPAROSCOPIC VENTRICULAR-PERITONEAL (V-P) SHUNT;  Surgeon: Sim Dryer, MD;  Location: [facility];  Service: General;  Laterality: N/A;   LUMBAR LAMINECTOMY/DECOMPRESSION MICRODISCECTOMY N/A 07/18/2022   Procedure: Repair of Pseudomeningiocele posterior;  Surgeon: Augusto Blonder, MD;  Location: [facility];  Service: Neurosurgery;  Laterality: N/A;   NASAL SEPTOPLASTY W/ TURBINOPLASTY  06/25/2011   Procedure: NASAL SEPTOPLASTY WITH TURBINATE REDUCTION;  Surgeon: Ammon Bales, MD;  Location: [facility];  Service: ENT;  Laterality: Bilateral;   PLACEMENT OF  LUMBAR DRAIN N/A 07/18/2022   Procedure: PLACEMENT OF LUMBAR DRAIN;  Surgeon: Augusto Blonder, MD;  Location: [facility];  Service: Neurosurgery;  Laterality: N/A;   POLYPECTOMY  06/09/2023   Procedure: POLYPECTOMY;  Surgeon: Genell Ken, MD;  Location: [facility];  Service: Gastroenterology;;   TONSILLECTOMY     TOTAL KNEE ARTHROPLASTY Right 06/01/2015   Procedure: RIGHT TOTAL KNEE ARTHROPLASTY;  Surgeon: Arnie Lao, MD;  Location: [facility];  Service: Orthopedics;  Laterality: Right;  Block+general   VENTRICULOPERITONEAL SHUNT Right 08/08/2022   Procedure: LAP ASSITED SHUNT INSERTION VENTRICULAR-PERITONEAL;  Surgeon: Augusto Blonder, MD;  Location: [facility];  Service: Neurosurgery;  Laterality: Right;    Medications: Current Outpatient Medications  Medication Sig Dispense Refill   acetaminophen  (TYLENOL ) 325 MG tablet Take 2 tablets (650 mg total) by mouth 3 (three) times daily.     alendronate (FOSAMAX) 10 MG tablet Take 10 mg by mouth daily before breakfast. Take with a full glass of water  on an empty stomach.     artificial tears (LACRILUBE) OINT ophthalmic ointment Place into both eyes 2  (two) times daily.     aspirin  EC 81 MG tablet Take 1 tablet (81 mg total) by mouth daily. Swallow whole. 30 tablet 12   calcium  carbonate (TUMS - DOSED IN MG ELEMENTAL CALCIUM ) 500 MG chewable tablet Chew 1 tablet (200 mg of elemental calcium  total) by mouth daily with supper.     Cholecalciferol (VITAMIN D3) 50 MCG (2000 UT) TABS Take 2,000 Units by mouth daily.     famotidine (PEPCID) 40 MG tablet 1 tablet Orally Once a day for 30 days     folic acid  (FOLVITE ) 1 MG tablet Take 1 mg by mouth daily.     methotrexate  (RHEUMATREX) 2.5 MG tablet Take 20 mg by mouth once a week. Takes 8 tablets(20mg ) every Saturday.     Caution:Chemotherapy. Protect from light.     metoprolol  succinate (TOPROL -XL) 25 MG 24 hr tablet TAKE 1 TABLET DAILY 90 tablet 3   Multiple Vitamins-Minerals (PRESERVISION AREDS 2 PO) Take 2 capsules by mouth daily.     predniSONE  (STERAPRED UNI-PAK 21 TAB) 5 MG (21) TBPK tablet Take 5 mg by mouth every morning.     RITUXAN  500 MG/50ML injection See admin instructions. Every 6 months 2 dose infusion Ruxience      rosuvastatin  (CRESTOR ) 20 MG tablet TAKE 1 TABLET DAILY (DOSE INCREASE) 90 tablet 2   sertraline  (ZOLOFT ) 20 MG/ML concentrated solution Take 25 mg by mouth daily.     spironolactone  (ALDACTONE ) 25 MG tablet 12.5 mg. 1/2 tab daily     sulfaSALAzine  (AZULFIDINE ) 500 MG tablet Take 1,000 mg by mouth 2 (two) times daily.     traMADol  (ULTRAM ) 50 MG tablet Take 1 tablet (50 mg total) by mouth every 12 (twelve) hours as needed for moderate pain. (Patient taking differently: Take 50 mg by mouth as needed for moderate pain (pain score 4-6).) 60 tablet 2   traZODone  (DESYREL ) 50 MG tablet TAKE 1.5 TABLETS (75 MG TOTAL) BY MOUTH AT BEDTIME AS NEEDED. FOR SLEEP 135 tablet 5   White Petrolatum -Mineral Oil (GENTEAL TEARS NIGHT-TIME OP) Apply to eye at bedtime.     No current facility-administered medications for this visit.    Allergies  Allergen Reactions   Hydroxychloroquine       Other Reaction(s): cardiolmyopathy    Diagnoses:  Major depression  Plan of Care: Outpatient psychotherapy and medication management as needed. Initial Note: (With wife [name]). She says that she  has hoped Christopher Yu would get into therapy. He has Ataxic Tremor. He told Dr. Winferd Hatter that he would like medication to just sit and do nothing. He says he always had golf to rey on and now cannot play. In December, he went n for a surgery to remove an avm from brain. Was told it was a routine surgery and was told that he needs to get it out. Was told it would be a short recovery and he would be back to work. 1 1/2 hour surgery turned into 6 hour surgery and he had complications. He had extra cerebral fluid and Dr. Arnetta Lank it would absorb and just wait. He kept declining and by March, they went in to try and drain the extra fluid. He was in hospital for a week. Wife noticed that during the hospital stay, his speech declined and he was clearly weak. Nurses were trying to discharge him and she protested, but they sent him home anyway. He declined, and could not sit, stand, talk or walk and she ended up calling ambulance. In ER, she ws told he had a stroke. He was in ER 24 hours before getting a room in Neuro area. Head of stroke team came in and said it was not a stroke, but complications from surgery. She was blamed by medical team for not "speaking up" before being initially discharged. He ended up in a rehab for 2 weeks and told them he needed another surgery. Was told he had hydrocephalus and need a shunt to drain liquid off his brain. She felt a lot of pressure from team that he needed surgery. She wanted a second opinion and had rouble getting it. Spoke to another Dr. and they decided to get the third surgery. He was in OT,PT and Speech Therapy. During therapy, he started having a tremor. They ended up being referred to Dr. Winferd Hatter, who has now told him that treating his tremor is tricky and will take time.  Christopher Yu is no longer  in OT,PT speech therapy. He is supposed to be doing exercises on his own, but has not been emotionally able to ge himself to do it. He has persistent headaches as well. Just had a CAT Scan and awaiting the results. His headaches are worse and they are concerned. He also has rhumatory arthritis and it is flaring up because he had to stop meds. He has cardiomyopathy, macular degeneration on top of all of these other conditions.  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 He has been approved for disability and are working to get social security disability. She works part time. They have 2 daughters, one with diabetes and arthritis. She is heading to [employer] this Fall and wants to work in Research scientist (physical sciences). Other daughter is 18 and is "our introvert with lots of anxiety". She is in therapy and has mental health issues that is being treated. She will be a Holiday representative at the middle college at [employer].  They are told that medicine will not control his condition, but they will have to try to retrain his brain. Suggested that they work to get back into OT and PT. She has a hard time motivating him. He admits that he keeps things "inside". She says he is not a good comunicator. He will not share things like if he is in pain or hurts himself.   Tx. Plan/Goals: Patient reports depressive symptoms secondary to the medical trauma  he has suffered and the debilitating physical results. He is seeking therapy to resolve his feelings of helplessness and hopelessness. Also want assistance in improving his drive/motivation in daily activity. Will engage in therapy that utilizes behavioral approaches to mitigate symptoms along with insight oriented counseling. Will also incorporate some conjoint sessions with spouse as needed, given her integral involvement with his care. Patient agrees with plan for treatment. Goal Date is 12-25    Patient agrees to be seen in video  (caregility) session and is aware of the limitations of this platform. He is at home and provider in home office. Session Note:  Patient states that he is doing well and has been watching a lot of golf. He says he has discovered that his balance issue is one of his biggest problems in everyday life. He and wife went to beach for a mini vacation and it was difficult to navigate the sand on the beach. He has been wearing the brace and thinks it is helpful. Is looking forward to trying it with his golf. Feels it has promise. He feels that he has the potential to embarrass whomever he is with, including his wife and his kids.                                                                                             Jola Nash, PhD 2:15p-3:00 45 minutes

## 2023-10-07 DIAGNOSIS — G4733 Obstructive sleep apnea (adult) (pediatric): Secondary | ICD-10-CM | POA: Diagnosis not present

## 2023-10-14 ENCOUNTER — Ambulatory Visit: Admitting: Psychology

## 2023-10-20 ENCOUNTER — Ambulatory Visit (INDEPENDENT_AMBULATORY_CARE_PROVIDER_SITE_OTHER): Admitting: Psychology

## 2023-10-20 DIAGNOSIS — F329 Major depressive disorder, single episode, unspecified: Secondary | ICD-10-CM

## 2023-10-20 DIAGNOSIS — F321 Major depressive disorder, single episode, moderate: Secondary | ICD-10-CM

## 2023-10-20 NOTE — Progress Notes (Signed)
 [facility] Counselor Initial Adult Exam  Name: Christopher Yu Date: 10/20/2023 MRN: [id number] DOB: 22-Sep-1960 PCP: Tena Feeling, MD    Guardian/Payee:  N/A    Paperwork requested: No   Reason for Visit [name] Problem: Adjustment to serious medical illness  Mental Status Exam: Appearance:   Casual     Behavior:  Appropriate  Motor:  Shuffling Gait  Speech/Language:   Slurred  Affect:  Depressed  Mood:  depressed  Thought process:  normal  Thought content:    WNL  Sensory/Perceptual disturbances:    WNL  Orientation:  oriented to person, place, and situation  Attention:  Good  Concentration:  Good  Memory:  WNL  Fund of knowledge:   Good  Insight:    Good  Judgment:   unknown  Impulse Control:  unknown    Reported Symptoms:  Sadness, helplessness and hopelessness  Risk Assessment: Danger to Self:  No Self-injurious Behavior: No Danger to Others: No Duty to Warn:no Physical Aggression / Violence:No  Access to Firearms a concern: Unknown Gang Involvement:No  Patient / guardian was educated about steps to take if suicide or homicide risk level increases between visits: n/a While future psychiatric events cannot be accurately predicted, the patient does not currently require acute inpatient psychiatric care and does not currently meet [place]  involuntary commitment criteria.  Substance Abuse History: Current substance abuse: No     Past Psychiatric History:   No previous psychological problems have been observed Outpatient Providers:N/A History of Psych Hospitalization: No  Psychological Testing: none   Abuse History:  Victim of:  N/A  Report needed:  No. Victim of Neglect:No. Perpetrator of N/A  Witness / Exposure to Domestic Violence: No   Protective Services Involvement: No  Witness to [employer] Violence:  No   Family History:  Family History  Problem Relation Age of Onset   Other Father        MVA   Lupus Sister     Living situation: the patient lives with their family  Sexual Orientation: Straight  Relationship Status: married  Name of spouse Christopher Yu If a parent, number of children / ages:Two girls, ages 76 and 59  Support Systems: spouse  Surveyor, quantity Stress:  Yes   Income/Employment/Disability: Software engineer: unknown  Educational History: Education: unknown  Oncologist: unknown  Any cultural differences that may affect / interfere with treatment:  not applicable   Recreation/Hobbies: golfing  Stressors: Financial difficulties   Health problems    Strengths: Family  Barriers:  severe physical limitations that compromises activity   Legal History: Pending legal issue / charges: The patient has no significant history of legal issues. History of  legal issue / charges: N/A  Medical History/Surgical History: reviewed Past Medical History:  Diagnosis Date   Anxiety    Arthritis, rheumatoid (HCC)    dx 1988   CAD (coronary artery disease), native coronary artery    Mild LAD disease with calcification noted at cath 12//27/16    Cardiomyopathy (HCC)    Cardiomyopathy (HCC)    Chronic systolic heart failure (HCC) 05/01/2015   Depression    Deviated nasal septum 06/25/2011   Headache    High cholesterol    Hyperlipidemia    Impingement syndrome of left shoulder 12/22/2017   Macular degeneration    Nausea and vomiting 08/28/2022   Rheumatoid aortitis    Sleep apnea    does not use cpap - UNABLE TO TOLERATE MASK   Sleep apnea in adult    deviated septum repaired, most recent sleep study was negative   Status post total right knee replacement 06/01/2015     Past Surgical History:  Procedure Laterality Date   ANKLE FUSION Right 05/05/2012   related to his arthritis   ANKLE FUSION Left 05/25/2013   related to his arthritis   ANKLE SURGERY Bilateral    APPLICATION OF CRANIAL NAVIGATION N/A 04/11/2022   Procedure: APPLICATION OF CRANIAL NAVIGATION;  Surgeon: Augusto Blonder, MD;  Location: [facility] OR;  Service: Neurosurgery;  Laterality: N/A;   APPLICATION OF CRANIAL NAVIGATION N/A 08/08/2022   Procedure: APPLICATION OF CRANIAL NAVIGATION;  Surgeon: Augusto Blonder, MD;  Location: [facility] OR;  Service: Neurosurgery;  Laterality: N/A;   BIOPSY  06/09/2023   Procedure: BIOPSY;  Surgeon: Genell Ken, MD;  Location: [facility];  Service: Gastroenterology;;   CARDIAC CATHETERIZATION N/A 05/01/2015   Procedure: Left Heart Cath and Coronary Angiography;  Surgeon: Arty Binning, MD;  Location: [facility] INVASIVE CV LAB;  Service: Cardiovascular;  Laterality: N/A;   COLONOSCOPY WITH PROPOFOL  N/A 06/09/2023   Procedure: COLONOSCOPY WITH PROPOFOL ;  Surgeon: Genell Ken, MD;  Location: [facility];  Service: Gastroenterology;  Laterality: N/A;   CRANIOTOMY N/A 04/11/2022   Procedure: STEREOTACTIC SUBOCCIPITAL CRANIOTOMY FOR RESECTION OF ARTERIO-VENOUS MALFORMATION;  Surgeon: Augusto Blonder, MD;  Location: [facility];  Service: Neurosurgery;  Laterality: N/A;   ESOPHAGOGASTRODUODENOSCOPY (EGD) WITH PROPOFOL  N/A 06/09/2023   Procedure: ESOPHAGOGASTRODUODENOSCOPY (EGD) WITH PROPOFOL ;  Surgeon: Genell Ken, MD;  Location: [facility];  Service: Gastroenterology;  Laterality: N/A;   FRACTURE SURGERY     left femur fracture x 3   HEMOSTASIS CLIP PLACEMENT  06/09/2023   Procedure: HEMOSTASIS CLIP PLACEMENT;  Surgeon: Genell Ken, MD;  Location: [facility];  Service: Gastroenterology;;   IR ANGIO EXTERNAL CAROTID SEL EXT CAROTID BILAT MOD SED  01/28/2022   IR ANGIO INTRA EXTRACRAN SEL INTERNAL CAROTID BILAT MOD SED  01/28/2022   IR ANGIO INTRA EXTRACRAN SEL INTERNAL CAROTID  BILAT MOD SED  04/16/2022   IR ANGIO VERTEBRAL SEL VERTEBRAL BILAT MOD SED  01/28/2022   IR ANGIO VERTEBRAL SEL VERTEBRAL BILAT MOD SED  04/16/2022   KNEE ARTHROSCOPY     right x 4   LAPAROSCOPIC REVISION VENTRICULAR-PERITONEAL (V-P) SHUNT N/A 08/08/2022   Procedure: LAPAROSCOPIC VENTRICULAR-PERITONEAL (V-P) SHUNT;  Surgeon: Sim Dryer, MD;  Location: [facility];  Service: General;  Laterality: N/A;   LUMBAR LAMINECTOMY/DECOMPRESSION MICRODISCECTOMY N/A 07/18/2022   Procedure: Repair of Pseudomeningiocele posterior;  Surgeon: Augusto Blonder, MD;  Location: [facility];  Service: Neurosurgery;  Laterality: N/A;   NASAL SEPTOPLASTY W/ TURBINOPLASTY  06/25/2011   Procedure: NASAL SEPTOPLASTY WITH TURBINATE REDUCTION;  Surgeon: Ammon Bales,  MD;  Location: [facility];  Service: ENT;  Laterality: Bilateral;   PLACEMENT OF LUMBAR DRAIN N/A 07/18/2022   Procedure: PLACEMENT OF LUMBAR DRAIN;  Surgeon: Augusto Blonder, MD;  Location: [facility];  Service: Neurosurgery;  Laterality: N/A;   POLYPECTOMY  06/09/2023   Procedure: POLYPECTOMY;  Surgeon: Genell Ken, MD;  Location: [facility];  Service: Gastroenterology;;   TONSILLECTOMY     TOTAL KNEE ARTHROPLASTY Right 06/01/2015   Procedure: RIGHT TOTAL KNEE ARTHROPLASTY;  Surgeon: Arnie Lao, MD;  Location: [facility];  Service: Orthopedics;  Laterality: Right;  Block+general   VENTRICULOPERITONEAL SHUNT Right 08/08/2022   Procedure: LAP ASSITED SHUNT INSERTION VENTRICULAR-PERITONEAL;  Surgeon: Augusto Blonder, MD;  Location: [facility];  Service: Neurosurgery;  Laterality: Right;    Medications: Current Outpatient Medications  Medication Sig Dispense Refill   acetaminophen  (TYLENOL ) 325 MG tablet Take 2 tablets (650 mg total) by mouth 3 (three) times daily.     alendronate (FOSAMAX) 10 MG tablet Take 10 mg by mouth daily before breakfast. Take with a full glass of water  on an empty stomach.     artificial tears (LACRILUBE) OINT ophthalmic ointment Place  into both eyes 2 (two) times daily.     aspirin  EC 81 MG tablet Take 1 tablet (81 mg total) by mouth daily. Swallow whole. 30 tablet 12   calcium  carbonate (TUMS - DOSED IN MG ELEMENTAL CALCIUM ) 500 MG chewable tablet Chew 1 tablet (200 mg of elemental calcium  total) by mouth daily with supper.     Cholecalciferol (VITAMIN D3) 50 MCG (2000 UT) TABS Take 2,000 Units by mouth daily.     famotidine (PEPCID) 40 MG tablet 1 tablet Orally Once a day for 30 days     folic acid  (FOLVITE ) 1 MG tablet Take 1 mg by mouth daily.     methotrexate  (RHEUMATREX) 2.5 MG tablet Take 20 mg by mouth once a week. Takes 8 tablets(20mg ) every Saturday.     Caution:Chemotherapy. Protect from light.     metoprolol  succinate (TOPROL -XL) 25 MG 24 hr tablet TAKE 1 TABLET DAILY 90 tablet 3   Multiple Vitamins-Minerals (PRESERVISION AREDS 2 PO) Take 2 capsules by mouth daily.     predniSONE  (STERAPRED UNI-PAK 21 TAB) 5 MG (21) TBPK tablet Take 5 mg by mouth every morning.     RITUXAN  500 MG/50ML injection See admin instructions. Every 6 months 2 dose infusion Ruxience      rosuvastatin  (CRESTOR ) 20 MG tablet TAKE 1 TABLET DAILY (DOSE INCREASE) 90 tablet 2   sertraline  (ZOLOFT ) 20 MG/ML concentrated solution Take 25 mg by mouth daily.     spironolactone  (ALDACTONE ) 25 MG tablet 12.5 mg. 1/2 tab daily     sulfaSALAzine  (AZULFIDINE ) 500 MG tablet Take 1,000 mg by mouth 2 (two) times daily.     traMADol  (ULTRAM ) 50 MG tablet Take 1 tablet (50 mg total) by mouth every 12 (twelve) hours as needed for moderate pain. (Patient taking differently: Take 50 mg by mouth as needed for moderate pain (pain score 4-6).) 60 tablet 2   traZODone  (DESYREL ) 50 MG tablet TAKE 1.5 TABLETS (75 MG TOTAL) BY MOUTH AT BEDTIME AS NEEDED. FOR SLEEP 135 tablet 5   White Petrolatum -Mineral Oil (GENTEAL TEARS NIGHT-TIME OP) Apply to eye at bedtime.     No current facility-administered medications for this visit.    Allergies  Allergen Reactions    Hydroxychloroquine      Other Reaction(s): cardiolmyopathy    Diagnoses:  Major depression  Plan of Care: Outpatient  psychotherapy and medication management as needed. Initial Note: (With wife Christopher Yu). She says that she has hoped Everhett would get into therapy. He has Ataxic Tremor. He told Dr. Winferd Hatter that he would like medication to just sit and do nothing. He says he always had golf to rey on and now cannot play. In December, he went n for a surgery to remove an avm from brain. Was told it was a routine surgery and was told that he needs to get it out. Was told it would be a short recovery and he would be back to work. 1 1/2 hour surgery turned into 6 hour surgery and he had complications. He had extra cerebral fluid and Dr. Arnetta Lank it would absorb and just wait. He kept declining and by March, they went in to try and drain the extra fluid. He was in hospital for a week. Wife noticed that during the hospital stay, his speech declined and he was clearly weak. Nurses were trying to discharge him and she protested, but they sent him home anyway. He declined, and could not sit, stand, talk or walk and she ended up calling ambulance. In ER, she ws told he had a stroke. He was in ER 24 hours before getting a room in Neuro area. Head of stroke team came in and said it was not a stroke, but complications from surgery. She was blamed by medical team for not speaking up before being initially discharged. He ended up in a rehab for 2 weeks and told them he needed another surgery. Was told he had hydrocephalus and need a shunt to drain liquid off his brain. She felt a lot of pressure from team that he needed surgery. She wanted a second opinion and had rouble getting it. Spoke to another Dr. and they decided to get the third surgery. He was in OT,PT and Speech Therapy. During therapy, he started having a tremor. They ended up being referred to Dr. Winferd Hatter, who has now told him that treating his tremor is tricky and will take  time.  Ricard is no longer in OT,PT speech therapy. He is supposed to be doing exercises on his own, but has not been emotionally able to ge himself to do it. He has persistent headaches as well. Just had a CAT Scan and awaiting the results. His headaches are worse and they are concerned. He also has rhumatory arthritis and it is flaring up because he had to stop meds. He has cardiomyopathy, macular degeneration on top of all of these other conditions.  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 He has been approved for disability and are working to get social security disability. She works part time. They have 2 daughters, one with diabetes and arthritis. She is heading to [employer] this Fall and wants to work in Research scientist (physical sciences). Other daughter is 55 and is our introvert with lots of anxiety. She is in therapy and has mental health issues that is being treated. She will be a Holiday representative at the middle college at [employer].  They are told that medicine will not control his condition, but they will have to try to retrain his brain. Suggested that they work to get back into OT and PT. She has a hard time motivating him. He admits that he keeps things inside. She says he is not a good comunicator. He will not share things like if he is in pain or  hurts himself.   Tx. Plan/Goals: Patient reports depressive symptoms secondary to the medical trauma he has suffered and the debilitating physical results. He is seeking therapy to resolve his feelings of helplessness and hopelessness. Also want assistance in improving his drive/motivation in daily activity. Will engage in therapy that utilizes behavioral approaches to mitigate symptoms along with insight oriented counseling. Will also incorporate some conjoint sessions with spouse as needed, given her integral involvement with his care. Patient agrees with plan for treatment. Goal Date is 12-25    Patient agrees  to be seen in video (caregility) session and is aware of the limitations of this platform. He is at home and provider in home office. Session Note:  Patient states that not much has changed and he is feeling pretty good. He reports that he was sick for about a week with no appetite. He thinks it may have been related to his medication. He started Wellbutrin XR three weeks ago, but thinks his body has adjusted. He has not yet noticed any difference in moods. He claims he has good days and not good days. Not very interested in talking this afternoon. Had very little to discuss and did not identify any particular reason.                                                                                               Jola Nash, PhD 2:10p-3:00 50 minutes

## 2023-10-28 ENCOUNTER — Ambulatory Visit (INDEPENDENT_AMBULATORY_CARE_PROVIDER_SITE_OTHER): Admitting: Psychology

## 2023-10-28 ENCOUNTER — Other Ambulatory Visit: Payer: Self-pay | Admitting: Interventional Cardiology

## 2023-10-28 DIAGNOSIS — F329 Major depressive disorder, single episode, unspecified: Secondary | ICD-10-CM | POA: Diagnosis not present

## 2023-10-28 DIAGNOSIS — F321 Major depressive disorder, single episode, moderate: Secondary | ICD-10-CM

## 2023-10-28 NOTE — Progress Notes (Signed)
 [facility] Counselor Initial Adult Exam  Name: Christopher Yu Date: 10/28/2023 MRN: [id number] DOB: [birth year] PCP: Dwight Trula SQUIBB, MD    Guardian/Payee:  N/A    Paperwork requested: No   Reason for Visit [name] Problem: Adjustment to serious medical illness  Mental Status Exam: Appearance:   Casual     Behavior:  Appropriate  Motor:  Shuffling Gait  Speech/Language:   Slurred  Affect:  Depressed  Mood:  depressed  Thought process:  normal  Thought content:    WNL  Sensory/Perceptual disturbances:    WNL  Orientation:  oriented to person, place, and situation  Attention:  Good  Concentration:  Good  Memory:  WNL  Fund of knowledge:   Good  Insight:    Good  Judgment:   unknown  Impulse Control:  unknown    Reported Symptoms:  Sadness, helplessness and hopelessness  Risk Assessment: Danger to Self:  No Self-injurious Behavior: No Danger to Others: No Duty to Warn:no Physical Aggression / Violence:No  Access to Firearms a concern: Unknown Gang Involvement:No  Patient / guardian was educated about steps to take if suicide or homicide risk level increases between visits: n/a While future psychiatric events cannot be accurately predicted, the patient does not currently require acute inpatient psychiatric care and does not currently meet [facility]  involuntary commitment criteria.  Substance Abuse History: Current substance abuse: No     Past Psychiatric History:   No previous psychological problems have been observed Outpatient Providers:N/A History of Psych Hospitalization: No  Psychological Testing: none   Abuse History:  Victim  of:  N/A  Report needed: No. Victim of Neglect:No. Perpetrator of N/A  Witness / Exposure to Domestic Violence: No   Protective Services Involvement: No  Witness to [employer] Violence:  No   Family History:  Family History  Problem Relation Age of Onset   Other Father        MVA   Lupus Sister     Living situation: the patient lives with their family  Sexual Orientation: Straight  Relationship Status: married  Name of spouse Christopher Yu If a parent, number of children / ages:Two girls, ages 87 and 81  Support Systems: spouse  Surveyor, quantity Stress:  Yes   Income/Employment/Disability: Software engineer: unknown  Educational History: Education: unknown  Oncologist: unknown  Any cultural differences that may affect / interfere with treatment:  not applicable   Recreation/Hobbies: golfing  Stressors: Financial difficulties   Health problems    Strengths: Family  Barriers:  severe physical limitations that compromises activity   Legal History: Pending  legal issue / charges: The patient has no significant history of legal issues. History of legal issue / charges: N/A  Medical History/Surgical History: reviewed Past Medical History:  Diagnosis Date   Anxiety    Arthritis, rheumatoid (HCC)    dx 1988   CAD (coronary artery disease), native coronary artery    Mild LAD disease with calcification noted at cath 12//27/16    Cardiomyopathy (HCC)    Cardiomyopathy (HCC)    Chronic systolic heart failure (HCC) 05/01/2015   Depression    Deviated nasal septum 06/25/2011   Headache    High cholesterol    Hyperlipidemia    Impingement syndrome of left shoulder 12/22/2017   Macular degeneration    Nausea and vomiting 08/28/2022   Rheumatoid aortitis    Sleep apnea    does not use cpap - UNABLE TO TOLERATE MASK   Sleep apnea in adult    deviated septum repaired, most recent sleep study was negative   Status post total right knee  replacement 06/01/2015    Past Surgical History:  Procedure Laterality Date   ANKLE FUSION Right 05/05/2012   related to his arthritis   ANKLE FUSION Left 05/25/2013   related to his arthritis   ANKLE SURGERY Bilateral    APPLICATION OF CRANIAL NAVIGATION N/A 04/11/2022   Procedure: APPLICATION OF CRANIAL NAVIGATION;  Surgeon: Lanis Pupa, MD;  Location: [facility] OR;  Service: Neurosurgery;  Laterality: N/A;   APPLICATION OF CRANIAL NAVIGATION N/A 08/08/2022   Procedure: APPLICATION OF CRANIAL NAVIGATION;  Surgeon: Lanis Pupa, MD;  Location: [facility] OR;  Service: Neurosurgery;  Laterality: N/A;   BIOPSY  06/09/2023   Procedure: BIOPSY;  Surgeon: Saintclair Jasper, MD;  Location: [facility];  Service: Gastroenterology;;   CARDIAC CATHETERIZATION N/A 05/01/2015   Procedure: Left Heart Cath and Coronary Angiography;  Surgeon: Victory LELON Sharps, MD;  Location: [facility] INVASIVE CV LAB;  Service: Cardiovascular;  Laterality: N/A;   COLONOSCOPY WITH PROPOFOL  N/A 06/09/2023   Procedure: COLONOSCOPY WITH PROPOFOL ;  Surgeon: Saintclair Jasper, MD;  Location: [facility];  Service: Gastroenterology;  Laterality: N/A;   CRANIOTOMY N/A 04/11/2022   Procedure: STEREOTACTIC SUBOCCIPITAL CRANIOTOMY FOR RESECTION OF ARTERIO-VENOUS MALFORMATION;  Surgeon: Lanis Pupa, MD;  Location: [facility];  Service: Neurosurgery;  Laterality: N/A;   ESOPHAGOGASTRODUODENOSCOPY (EGD) WITH PROPOFOL  N/A 06/09/2023   Procedure: ESOPHAGOGASTRODUODENOSCOPY (EGD) WITH PROPOFOL ;  Surgeon: Saintclair Jasper, MD;  Location: [facility];  Service: Gastroenterology;  Laterality: N/A;   FRACTURE SURGERY     left femur fracture x 3   HEMOSTASIS CLIP PLACEMENT  06/09/2023   Procedure: HEMOSTASIS CLIP PLACEMENT;  Surgeon: Saintclair Jasper, MD;  Location: [facility];  Service: Gastroenterology;;   IR ANGIO EXTERNAL CAROTID SEL EXT CAROTID BILAT MOD SED  01/28/2022   IR ANGIO INTRA EXTRACRAN SEL INTERNAL CAROTID BILAT MOD SED  01/28/2022   IR ANGIO INTRA EXTRACRAN  SEL INTERNAL CAROTID BILAT MOD SED  04/16/2022   IR ANGIO VERTEBRAL SEL VERTEBRAL BILAT MOD SED  01/28/2022   IR ANGIO VERTEBRAL SEL VERTEBRAL BILAT MOD SED  04/16/2022   KNEE ARTHROSCOPY     right x 4   LAPAROSCOPIC REVISION VENTRICULAR-PERITONEAL (V-P) SHUNT N/A 08/08/2022   Procedure: LAPAROSCOPIC VENTRICULAR-PERITONEAL (V-P) SHUNT;  Surgeon: Vanderbilt Ned, MD;  Location: [facility];  Service: General;  Laterality: N/A;   LUMBAR LAMINECTOMY/DECOMPRESSION MICRODISCECTOMY N/A 07/18/2022   Procedure: Repair of Pseudomeningiocele posterior;  Surgeon: Lanis Pupa, MD;  Location: [facility];  Service: Neurosurgery;  Laterality: N/A;   NASAL SEPTOPLASTY W/  TURBINOPLASTY  06/25/2011   Procedure: NASAL SEPTOPLASTY WITH TURBINATE REDUCTION;  Surgeon: Alm Bouche, MD;  Location: [facility] OR;  Service: ENT;  Laterality: Bilateral;   PLACEMENT OF LUMBAR DRAIN N/A 07/18/2022   Procedure: PLACEMENT OF LUMBAR DRAIN;  Surgeon: Lanis Pupa, MD;  Location: [facility];  Service: Neurosurgery;  Laterality: N/A;   POLYPECTOMY  06/09/2023   Procedure: POLYPECTOMY;  Surgeon: Saintclair Jasper, MD;  Location: [facility];  Service: Gastroenterology;;   TONSILLECTOMY     TOTAL KNEE ARTHROPLASTY Right 06/01/2015   Procedure: RIGHT TOTAL KNEE ARTHROPLASTY;  Surgeon: Lonni CINDERELLA Poli, MD;  Location: [facility];  Service: Orthopedics;  Laterality: Right;  Block+general   VENTRICULOPERITONEAL SHUNT Right 08/08/2022   Procedure: LAP ASSITED SHUNT INSERTION VENTRICULAR-PERITONEAL;  Surgeon: Lanis Pupa, MD;  Location: [facility];  Service: Neurosurgery;  Laterality: Right;    Medications: Current Outpatient Medications  Medication Sig Dispense Refill   acetaminophen  (TYLENOL ) 325 MG tablet Take 2 tablets (650 mg total) by mouth 3 (three) times daily.     alendronate (FOSAMAX) 10 MG tablet Take 10 mg by mouth daily before breakfast. Take with a full glass of water  on an empty stomach.     artificial tears (LACRILUBE) OINT  ophthalmic ointment Place into both eyes 2 (two) times daily.     aspirin  EC 81 MG tablet Take 1 tablet (81 mg total) by mouth daily. Swallow whole. 30 tablet 12   calcium  carbonate (TUMS - DOSED IN MG ELEMENTAL CALCIUM ) 500 MG chewable tablet Chew 1 tablet (200 mg of elemental calcium  total) by mouth daily with supper.     Cholecalciferol (VITAMIN D3) 50 MCG (2000 UT) TABS Take 2,000 Units by mouth daily.     famotidine (PEPCID) 40 MG tablet 1 tablet Orally Once a day for 30 days     folic acid  (FOLVITE ) 1 MG tablet Take 1 mg by mouth daily.     methotrexate  (RHEUMATREX) 2.5 MG tablet Take 20 mg by mouth once a week. Takes 8 tablets(20mg ) every Saturday.     Caution:Chemotherapy. Protect from light.     metoprolol  succinate (TOPROL -XL) 25 MG 24 hr tablet TAKE 1 TABLET DAILY 90 tablet 3   Multiple Vitamins-Minerals (PRESERVISION AREDS 2 PO) Take 2 capsules by mouth daily.     predniSONE  (STERAPRED UNI-PAK 21 TAB) 5 MG (21) TBPK tablet Take 5 mg by mouth every morning.     RITUXAN  500 MG/50ML injection See admin instructions. Every 6 months 2 dose infusion Ruxience      rosuvastatin  (CRESTOR ) 20 MG tablet TAKE 1 TABLET DAILY (DOSE INCREASE) 90 tablet 2   sertraline  (ZOLOFT ) 20 MG/ML concentrated solution Take 25 mg by mouth daily.     spironolactone  (ALDACTONE ) 25 MG tablet 12.5 mg. 1/2 tab daily     sulfaSALAzine  (AZULFIDINE ) 500 MG tablet Take 1,000 mg by mouth 2 (two) times daily.     traMADol  (ULTRAM ) 50 MG tablet Take 1 tablet (50 mg total) by mouth every 12 (twelve) hours as needed for moderate pain. (Patient taking differently: Take 50 mg by mouth as needed for moderate pain (pain score 4-6).) 60 tablet 2   traZODone  (DESYREL ) 50 MG tablet TAKE 1.5 TABLETS (75 MG TOTAL) BY MOUTH AT BEDTIME AS NEEDED. FOR SLEEP 135 tablet 5   White Petrolatum -Mineral Oil (GENTEAL TEARS NIGHT-TIME OP) Apply to eye at bedtime.     No current facility-administered medications for this visit.    Allergies   Allergen Reactions   Hydroxychloroquine   Other Reaction(s): cardiolmyopathy    Diagnoses:  Major depression  Plan of Care: Outpatient psychotherapy and medication management as needed. Initial Note: (With wife Christopher Yu). She says that she has hoped Nathanal would get into therapy. He has Ataxic Tremor. He told Dr. Evonnie that he would like medication to just sit and do nothing. He says he always had golf to rey on and now cannot play. In December, he went n for a surgery to remove an avm from brain. Was told it was a routine surgery and was told that he needs to get it out. Was told it would be a short recovery and he would be back to work. 1 1/2 hour surgery turned into 6 hour surgery and he had complications. He had extra cerebral fluid and Dr. Glenwood it would absorb and just wait. He kept declining and by March, they went in to try and drain the extra fluid. He was in hospital for a week. Wife noticed that during the hospital stay, his speech declined and he was clearly weak. Nurses were trying to discharge him and she protested, but they sent him home anyway. He declined, and could not sit, stand, talk or walk and she ended up calling ambulance. In ER, she ws told he had a stroke. He was in ER 24 hours before getting a room in Neuro area. Head of stroke team came in and said it was not a stroke, but complications from surgery. She was blamed by medical team for not speaking up before being initially discharged. He ended up in a rehab for 2 weeks and told them he needed another surgery. Was told he had hydrocephalus and need a shunt to drain liquid off his brain. She felt a lot of pressure from team that he needed surgery. She wanted a second opinion and had rouble getting it. Spoke to another Dr. and they decided to get the third surgery. He was in OT,PT and Speech Therapy. During therapy, he started having a tremor. They ended up being referred to Dr. Evonnie, who has now told him that treating his tremor is  tricky and will take time.  Osiah is no longer in OT,PT speech therapy. He is supposed to be doing exercises on his own, but has not been emotionally able to ge himself to do it. He has persistent headaches as well. Just had a CAT Scan and awaiting the results. His headaches are worse and they are concerned. He also has rhumatory arthritis and it is flaring up because he had to stop meds. He has cardiomyopathy, macular degeneration on top of all of these other conditions.  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 He has been approved for disability and are working to get social security disability. She works part time. They have 2 daughters, one with diabetes and arthritis. She is heading to [employer] this Fall and wants to work in Research scientist (physical sciences). Other daughter is 71 and is our introvert with lots of anxiety. She is in therapy and has mental health issues that is being treated. She will be a Holiday representative at the middle college at [employer].  They are told that medicine will not control his condition, but they will have to try to retrain his brain. Suggested that they work to get back into OT and PT. She has a hard time motivating him. He admits that he keeps things inside. She says he is not  a good comunicator. He will not share things like if he is in pain or hurts himself.   Tx. Plan/Goals: Patient reports depressive symptoms secondary to the medical trauma he has suffered and the debilitating physical results. He is seeking therapy to resolve his feelings of helplessness and hopelessness. Also want assistance in improving his drive/motivation in daily activity. Will engage in therapy that utilizes behavioral approaches to mitigate symptoms along with insight oriented counseling. Will also incorporate some conjoint sessions with spouse as needed, given her integral involvement with his care. Patient agrees with plan for treatment. Goal Date is  12-25    Patient agrees to be seen in video (caregility) session and is aware of the limitations of this platform. He is at home and provider in home office. Session Note:  Patient states he is feeling good lately. Not much has changed and he welcomes the quiet. He accepts that there are things in life he cannot control and that there is no point to worry. He was alone with the dog for the past 10 days as his family was out of town to attend wife's father's funeral near [place]. There had been a ceremony here as well. He hates his wife's sister and brother and chose not to attend either service for father in law to avoid seeing them.  He says his youngest daughter is doing well. She is glad to be home and babies the dog. Older daughter is hoping to be a physician. She is a straight A Consulting civil engineer at [employer]. Younger daughter not as driven, but very smart as well. Daughters have no plans for the summer. Talked about the changes in household while both daughters are home.                                                                                                   CONI ALM KERNS, PhD 4:10p-5:00 50 minutes

## 2023-10-29 ENCOUNTER — Ambulatory Visit: Admitting: Speech Pathology

## 2023-11-03 ENCOUNTER — Encounter: Admitting: Speech Pathology

## 2023-11-03 DIAGNOSIS — M79643 Pain in unspecified hand: Secondary | ICD-10-CM | POA: Diagnosis not present

## 2023-11-03 DIAGNOSIS — M0579 Rheumatoid arthritis with rheumatoid factor of multiple sites without organ or systems involvement: Secondary | ICD-10-CM | POA: Diagnosis not present

## 2023-11-03 DIAGNOSIS — M25512 Pain in left shoulder: Secondary | ICD-10-CM | POA: Diagnosis not present

## 2023-11-03 DIAGNOSIS — M2550 Pain in unspecified joint: Secondary | ICD-10-CM | POA: Diagnosis not present

## 2023-11-03 DIAGNOSIS — Z79899 Other long term (current) drug therapy: Secondary | ICD-10-CM | POA: Diagnosis not present

## 2023-11-05 ENCOUNTER — Ambulatory Visit: Attending: Physical Medicine and Rehabilitation | Admitting: Speech Pathology

## 2023-11-05 DIAGNOSIS — R471 Dysarthria and anarthria: Secondary | ICD-10-CM | POA: Diagnosis not present

## 2023-11-05 DIAGNOSIS — R1312 Dysphagia, oropharyngeal phase: Secondary | ICD-10-CM | POA: Diagnosis not present

## 2023-11-05 NOTE — Therapy (Signed)
 OUTPATIENT SPEECH LANGUAGE PATHOLOGY TREATMENT NOTE   Patient Name: Christopher Yu MRN: [id number] DOB:08-May-1960, 63 y.o., male Today's Date: 11/05/2023  PCP: Dwight Trula SQUIBB, MD REFERRING PROVIDER: Emeline Joesph BROCKS, DO  END OF SESSION:  End of Session - 11/05/23 1525     Visit Number 2    Number of Visits 17    Date for SLP Re-Evaluation 11/30/23   extened due to scheduling   Authorization Type [employer]    SLP Start Time 1525    SLP Stop Time  1558    SLP Time Calculation (min) 33 min    Activity Tolerance Patient tolerated treatment well          Past Medical History:  Diagnosis Date   Anxiety    Arthritis, rheumatoid (HCC)    dx 1988   CAD (coronary artery disease), native coronary artery    Mild LAD disease with calcification noted at cath 12//27/16    Cardiomyopathy (HCC)    Cardiomyopathy (HCC)    Chronic systolic heart failure (HCC) 05/01/2015   Depression    Deviated nasal septum 06/25/2011   Headache    High cholesterol    Hyperlipidemia    Impingement syndrome of left shoulder 12/22/2017   Macular degeneration    Nausea and vomiting 08/28/2022   Rheumatoid aortitis    Sleep apnea    does not use cpap - UNABLE TO TOLERATE MASK   Sleep apnea in adult    deviated septum repaired, most recent sleep study was negative   Status post total right knee replacement 06/01/2015   Past Surgical History:  Procedure Laterality Date   ANKLE FUSION Right 05/05/2012   related to his arthritis   ANKLE FUSION Left 05/25/2013   related to his arthritis   ANKLE SURGERY Bilateral    APPLICATION OF CRANIAL NAVIGATION N/A 04/11/2022   Procedure: APPLICATION OF CRANIAL NAVIGATION;  Surgeon: Lanis Pupa, MD;  Location: [facility];  Service: Neurosurgery;  Laterality: N/A;   APPLICATION OF CRANIAL NAVIGATION N/A 08/08/2022   Procedure: APPLICATION OF CRANIAL NAVIGATION;  Surgeon: Lanis Pupa, MD;  Location: [facility];  Service: Neurosurgery;  Laterality: N/A;    BIOPSY  06/09/2023   Procedure: BIOPSY;  Surgeon: Saintclair Jasper, MD;  Location: [facility];  Service: Gastroenterology;;   CARDIAC CATHETERIZATION N/A 05/01/2015   Procedure: Left Heart Cath and Coronary Angiography;  Surgeon: Victory LELON Sharps, MD;  Location: [facility] INVASIVE CV LAB;  Service: Cardiovascular;  Laterality: N/A;   COLONOSCOPY WITH PROPOFOL  N/A 06/09/2023   Procedure: COLONOSCOPY WITH PROPOFOL ;  Surgeon: Saintclair Jasper, MD;  Location: [facility];  Service: Gastroenterology;  Laterality: N/A;   CRANIOTOMY N/A 04/11/2022   Procedure: STEREOTACTIC SUBOCCIPITAL CRANIOTOMY FOR RESECTION OF ARTERIO-VENOUS MALFORMATION;  Surgeon: Lanis Pupa, MD;  Location: [facility];  Service: Neurosurgery;  Laterality: N/A;   ESOPHAGOGASTRODUODENOSCOPY (EGD) WITH PROPOFOL  N/A 06/09/2023   Procedure: ESOPHAGOGASTRODUODENOSCOPY (EGD) WITH PROPOFOL ;  Surgeon: Saintclair Jasper, MD;  Location: [facility];  Service: Gastroenterology;  Laterality: N/A;   FRACTURE SURGERY     left femur fracture x 3   HEMOSTASIS CLIP PLACEMENT  06/09/2023   Procedure: HEMOSTASIS CLIP PLACEMENT;  Surgeon: Saintclair Jasper, MD;  Location: [facility];  Service: Gastroenterology;;   IR ANGIO EXTERNAL CAROTID SEL EXT CAROTID BILAT MOD SED  01/28/2022   IR ANGIO INTRA EXTRACRAN SEL INTERNAL CAROTID BILAT MOD SED  01/28/2022   IR ANGIO INTRA EXTRACRAN SEL INTERNAL CAROTID BILAT MOD SED  04/16/2022   IR ANGIO VERTEBRAL SEL VERTEBRAL  BILAT MOD SED  01/28/2022   IR ANGIO VERTEBRAL SEL VERTEBRAL BILAT MOD SED  04/16/2022   KNEE ARTHROSCOPY     right x 4   LAPAROSCOPIC REVISION VENTRICULAR-PERITONEAL (V-P) SHUNT N/A 08/08/2022   Procedure: LAPAROSCOPIC VENTRICULAR-PERITONEAL (V-P) SHUNT;  Surgeon: Vanderbilt Ned, MD;  Location: [facility];  Service: General;  Laterality: N/A;   LUMBAR LAMINECTOMY/DECOMPRESSION MICRODISCECTOMY N/A 07/18/2022   Procedure: Repair of Pseudomeningiocele posterior;  Surgeon: Lanis Pupa, MD;  Location: [facility];  Service:  Neurosurgery;  Laterality: N/A;   NASAL SEPTOPLASTY W/ TURBINOPLASTY  06/25/2011   Procedure: NASAL SEPTOPLASTY WITH TURBINATE REDUCTION;  Surgeon: Alm Bouche, MD;  Location: [facility] OR;  Service: ENT;  Laterality: Bilateral;   PLACEMENT OF LUMBAR DRAIN N/A 07/18/2022   Procedure: PLACEMENT OF LUMBAR DRAIN;  Surgeon: Lanis Pupa, MD;  Location: [facility];  Service: Neurosurgery;  Laterality: N/A;   POLYPECTOMY  06/09/2023   Procedure: POLYPECTOMY;  Surgeon: Saintclair Jasper, MD;  Location: [facility];  Service: Gastroenterology;;   TONSILLECTOMY     TOTAL KNEE ARTHROPLASTY Right 06/01/2015   Procedure: RIGHT TOTAL KNEE ARTHROPLASTY;  Surgeon: Lonni CINDERELLA Poli, MD;  Location: [facility];  Service: Orthopedics;  Laterality: Right;  Block+general   VENTRICULOPERITONEAL SHUNT Right 08/08/2022   Procedure: LAP ASSITED SHUNT INSERTION VENTRICULAR-PERITONEAL;  Surgeon: Lanis Pupa, MD;  Location: [facility];  Service: Neurosurgery;  Laterality: Right;   Patient Active Problem List   Diagnosis Date Noted   Dysphagia 05/04/2023   At risk for central sleep apnea 03/05/2023   Chronic nonintractable headache 02/02/2023   Insomnia 10/20/2022   Anxiety and depression 10/20/2022   Nausea and vomiting 08/28/2022   Adjustment disorder 08/06/2022   Protein-calorie malnutrition, severe 07/31/2022   Cerebellar stroke (HCC) 07/29/2022   Ischemic cerebrovascular accident (CVA) (HCC) 07/28/2022   Prolonged QT interval 07/28/2022   Pseudomeningocele 07/18/2022   Pseudomeningocele due to surgical procedure 07/18/2022   Status post craniotomy 04/11/2022   AVM (arteriovenous malformation) brain 04/11/2022   Impingement syndrome of left shoulder 12/22/2017   Impingement syndrome of right shoulder 12/22/2017   Rheumatoid arthritis involving right knee (HCC) 06/01/2015   Status post total right knee replacement 06/01/2015   Cardiomyopathy (HCC) 05/02/2015   Hyperlipidemia    Sleep apnea in adult    Rheumatoid  arthritis (HCC)    Chronic systolic heart failure (HCC) 05/01/2015   CAD (coronary artery disease), native coronary artery     ONSET DATE: 08/13/2023 (referral date); CVA 07/28/22  REFERRING DIAG: R13.10 (ICD-10-CM) - Dysphagia, unspecified type I63.9 (ICD-10-CM) - Cerebellar stroke (HCC)  THERAPY DIAG:  Dysphagia, oropharyngeal phase  Rationale for Evaluation and Treatment: Rehabilitation  SUBJECTIVE:   SUBJECTIVE STATEMENT: better re: swallowing  Pt accompanied by: significant other [name]  PERTINENT HISTORY: Mr. Bollig is seen today referred for  MBSS and outpatient ST as he and his wife both reported increased coughing with solids and thin liquids, which prompted scheduling MBSS. Pt has a hx of dysarthria and dysphagia following stroke in March 2024. Prior MBSS completed 08/18/22 with recommendation of a regular diet, nectar-thick liquids, and L head turn strategy. He completed outpatient SLP tx in June 2024 and self-advanced diet to regular solids and thin liquids and he had elected to defer head turn left with swallow, therefore a follow up MBSS was forgone. Per Mr. Mckenzie and wife, he has not had any concerns for pneumonia or pulmonary infection since advancing to thin liquids, despite coughing occurences.  PAIN:  Are you having pain? No  FALLS: Has patient fallen in last 6 months?  No  LIVING ENVIRONMENT: Lives with: lives with their family Lives in: House/apartment  PLOF:  Level of assistance: Independent with ADLs, Needed assistance with IADLS Employment: On disability  PATIENT GOALS: To improve my ability to communicate  OBJECTIVE:  Note: Objective measures were completed at Evaluation unless otherwise noted. OBJECTIVE:   INSTRUMENTAL SWALLOW STUDY FINDINGS (MBSS)  Objective swallow impairments: There was frank aspiration with delayed cough response of thin liquids by cup with head in neutral position.  -No penetration or aspiration of pudding or cracker  trial. -L head turn significantly aided airway protection with thin liquids. While it did not completely eliminate aspiration risk, the amount of penetration/aspiration was significantly reduced. Continued to observe deep, stagnant penetration of thin liquids (in isolation) with use of L head turn.  -L Head turn plus Chin tuck resulted in trace silent aspiration of thin liquids. Chin tuck not recommended.  Objective recommended compensations:    -Regular diet  -Thin liquids with L Head Turn -PO meds whole in pudding or applesauce -Consider nectar-thick liquids if Mr. Stohr has difficulty remembering L head turn strategy or if coughing with thin liquids becomes bothersome.  -Follow up with OP SLP    COGNITION: Overall cognitive status: Impaired Areas of impairment:  Attention: Impaired: Alternating, Divided Awareness: Impaired: Anticipatory Functional deficits:   SUBJECTIVE DYSPHAGIA REPORTS:  Date of onset: 07/28/22 Reported symptoms: coughing with liquids and choking with liquids  Current diet: regular and thin liquids  Co-morbid voice changes: No  FACTORS WHICH MAY INCREASE RISK OF ADVERSE EVENT IN PRESENCE OF ASPIRATION:  General health: poor general health  Risk factors: none evident     ORAL MOTOR EXAMINATION: Overall status: Impaired:   Labial: Bilateral (Coordination) Lingual: Bilateral (Coordination) Velum: Coordination Comments:   CLINICAL SWALLOW ASSESSMENT:   Dentition: adequate natural dentition Vocal quality at baseline: normal Patient directly observed with POs: Yes: thin liquids  Feeding: able to feed self Liquids provided by: coke bottle [facility] Swallow Protocol: n/a Oral phase signs and symptoms: prolonged bolus formation Pharyngeal phase signs and symptoms: immediate cough                                                                                                                          TREATMENT DATE:   11/05/23: Swallow: SLP reviewed all  dysphagia exercises and led pt through iteration of the same to address physiologic impairments. Pt completed x20 effortful swallow with intermittent coughing exhibited given thin liquid bolus; x1 successful Masako, multiple unsuccessful attempts; x6 Mendelsohns with 3 second hold; x5 superior lingual press with 3 second hold; :60 CTAR with fist using active resistance.   Speech: reviewed rec to get attention prior to speaking to ensure listener is paying attention. Introduced clear speech, as pt report hard time carrying over dysarthria strategies. Will plan to utilize in reading and phrase generation next session.   09/07/23: Eval completed -   Swallow:Initiated training in HEP  for dysphagia - pt required frequent mod A for effortful swallow, masako, CTAR - unable to perform Mendelson with max A. Millard states I can't swallow 5x in a row. Reviewed swallowing precautions - with verbal cues Jshaun demonstrated head turn left 5/5 sips and verbalized effortful swallow. Effortful swallow employed to support mindfulness and intent when swallowing. He states he forgets to turn his head left - we generated strategies of placing signs to remind him, placing TV to the left when he eats (although I recommend eliminating distractions with meals)and eating at a table. He reports biting his cheeks and tongue during meals. Instructed in using strategy of chewing and swallowing with intent to improve awareness. Education provided on using head turn and swallowing with intent with mixed consistencies as he does eat cereal and soup regularly.  Speech: initiated training in strategies to support intelligibility and expression including saying Liz's name and making sure he gets her attention before he starts speaking, as well as saying 1`-2 key words rather than pointing.    PATIENT EDUCATION: Education details: See Treatment above; HEP for dysphagia; swallow precautions; diet modifications; communication  strategies Person educated: Patient and Spouse Education method: Explanation, Demonstration, Verbal cues, and Handouts Education comprehension: verbalized understanding, returned demonstration, verbal cues required, and needs further education   ASSESSMENT:  CLINICAL IMPRESSION: Patient is a 63 y.o. male who was seen today for mild to moderate oropharyngeal dysphagia; mild ataxic dysarthria; mild cognitive communication impairment. He is well known to us  from prior course of outpatient ST May and June of 2024. He has not carried over recommendation for head turn left and reports increased coughing with meals. [name] worries about him choking when she is not home. He is eating only soft foods when she is at work. Teon reports he is having difficulty communicating quickly and [name] endorses he is not talking as much, but pointing, even to get her attention. She states that he just starts talking and she doesn't know who he is talking to and she has to request information be repeated. Overall poor anticipatory awareness in lack of carrying over head turn left, not getting attention before speaking, biting his cheeks and tongue. I recommend skilled ST to maximize safety of swallow, intelligibility, verbal expression for safety, independence and to reduce caregiver burden  OBJECTIVE IMPAIRMENTS: include executive functioning, expressive language, dysarthria, and dysphagia. These impairments are limiting patient from return to work, managing medications, managing finances, ADLs/IADLs, effectively communicating at home and in community, and safety when swallowing. Factors affecting potential to achieve goals and functional outcome are cooperation/participation level. Patient will benefit from skilled SLP services to address above impairments and improve overall function.  REHAB POTENTIAL: Fair due to time post onset, cognitive impairments   GOALS: Goals reviewed with patient? Yes  SHORT TERM GOALS: Target  date: 11/12/23 (extended due to scheduling)  Pt will complete HEP for dysphagia with rare min A  Baseline: Goal status: INITIAL  2.  Pt will follow swallow precautions and diet modifications with rare min A Baseline:  Goal status: INITIAL  3.  Pt will verbally get person's attention and eye contact prior to speaking with rare min A Baseline:  Goal status: INITIAL  4.  Pt will report 50% reduced biting of his cheek and tongue while eating by chewing and swallowing with intent  Baseline:  Goal status: INITIAL    LONG TERM GOALS: Target date: 12/02/23  Pt will complete HEP at least 1x a day for 1 week Baseline:  Goal status: INITIAL  2.  Pt will carryover swallow precautions including eating with intent and head turn left with mod I Baseline:  Goal status: INITIAL  3.  Spouse will report 50% need to request repetition or clarification when Joshue is talking Baseline:  Goal status: INITIAL  4.  Pt and spouse will carryover 2 compensatory strategies including environmental modifications to support processing and understanding of conversations Baseline:  Goal status: INITIAL   PLAN:  SLP FREQUENCY: 2x/week  SLP DURATION: 6 weeks - POC dates extended due to scheduling  PLANNED INTERVENTIONS: Aspiration precaution training, Pharyngeal strengthening exercises, Diet toleration management , Language facilitation, Environmental controls, Trials of upgraded texture/liquids, Cueing hierachy, Cognitive reorganization, Internal/external aids, Functional tasks, Multimodal communication approach, SLP instruction and feedback, Compensatory strategies, Patient/family education, [phone] Treatment of speech (30 or 45 min) , and [id number] Treatment of swallowing function    Harlene LITTIE Ned, CCC-SLP 11/05/2023, 3:26 PM

## 2023-11-06 DIAGNOSIS — G4733 Obstructive sleep apnea (adult) (pediatric): Secondary | ICD-10-CM | POA: Diagnosis not present

## 2023-11-10 ENCOUNTER — Ambulatory Visit: Admitting: Speech Pathology

## 2023-11-10 ENCOUNTER — Ambulatory Visit (INDEPENDENT_AMBULATORY_CARE_PROVIDER_SITE_OTHER): Admitting: Psychology

## 2023-11-10 DIAGNOSIS — R471 Dysarthria and anarthria: Secondary | ICD-10-CM

## 2023-11-10 DIAGNOSIS — F321 Major depressive disorder, single episode, moderate: Secondary | ICD-10-CM

## 2023-11-10 DIAGNOSIS — R1312 Dysphagia, oropharyngeal phase: Secondary | ICD-10-CM

## 2023-11-10 NOTE — Progress Notes (Signed)
 [facility] Counselor Initial Adult Exam  Name: Christopher Yu Date: 11/10/2023 MRN: [id number] DOB: Apr 07, 1961 PCP: Dwight Trula SQUIBB, MD    Guardian/Payee:  N/A    Paperwork requested: No   Reason for Visit [name] Problem: Adjustment to serious medical illness  Mental Status Exam: Appearance:   Casual     Behavior:  Appropriate  Motor:  Shuffling Gait  Speech/Language:   Slurred  Affect:  Depressed  Mood:  depressed  Thought process:  normal  Thought content:    WNL  Sensory/Perceptual disturbances:    WNL  Orientation:  oriented to person, place, and situation  Attention:  Good  Concentration:  Good  Memory:  WNL  Fund of knowledge:   Good  Insight:    Good  Judgment:   unknown  Impulse Control:  unknown    Reported Symptoms:  Sadness, helplessness and hopelessness  Risk Assessment: Danger to Self:  No Self-injurious Behavior: No Danger to Others: No Duty to Warn:no Physical Aggression / Violence:No  Access to Firearms a concern: Unknown Gang Involvement:No  Patient / guardian was educated about steps to take if suicide or homicide risk level increases between visits: n/a While future psychiatric events cannot be accurately predicted, the patient does not currently require acute inpatient psychiatric care and does not currently meet [place]  involuntary commitment criteria.  Substance Abuse History: Current substance abuse: No     Past Psychiatric History:   No previous psychological problems have been observed Outpatient Providers:N/A History of Psych Hospitalization: No  Psychological Testing:  none   Abuse History:  Victim of:  N/A  Report needed: No. Victim of Neglect:No. Perpetrator of N/A  Witness / Exposure to Domestic Violence: No   Protective Services Involvement: No  Witness to [employer] Violence:  No   Family History:  Family History  Problem Relation Age of Onset   Other Father        MVA   Lupus Sister     Living situation: the patient lives with their family  Sexual Orientation: Straight  Relationship Status: married  Name of spouse [name] If a parent, number of children / ages:Two girls, ages 54 and 78  Support Systems: spouse  Surveyor, quantity Stress:  Yes   Income/Employment/Disability: Software engineer: unknown  Educational History: Education: unknown  Oncologist: unknown  Any cultural differences that may affect / interfere with treatment:  not applicable   Recreation/Hobbies: golfing  Stressors: Financial difficulties   Health problems    Strengths:  Family  Barriers:  severe physical limitations that compromises activity   Legal History: Pending legal issue / charges: The patient has no significant history of legal issues. History of legal issue / charges: N/A  Medical History/Surgical History: reviewed Past Medical History:  Diagnosis Date   Anxiety    Arthritis, rheumatoid (HCC)    dx 1988   CAD (coronary artery disease), native coronary artery    Mild LAD disease with calcification noted at cath 12//27/16    Cardiomyopathy (HCC)    Cardiomyopathy (HCC)    Chronic systolic heart failure (HCC) 05/01/2015   Depression    Deviated nasal septum 06/25/2011   Headache    High cholesterol    Hyperlipidemia    Impingement syndrome of left shoulder 12/22/2017   Macular degeneration    Nausea and vomiting 08/28/2022   Rheumatoid aortitis    Sleep apnea    does not use cpap - UNABLE TO TOLERATE MASK   Sleep apnea in adult    deviated septum repaired, most recent sleep study was negative    Status post total right knee replacement 06/01/2015    Past Surgical History:  Procedure Laterality Date   ANKLE FUSION Right 05/05/2012   related to his arthritis   ANKLE FUSION Left 05/25/2013   related to his arthritis   ANKLE SURGERY Bilateral    APPLICATION OF CRANIAL NAVIGATION N/A 04/11/2022   Procedure: APPLICATION OF CRANIAL NAVIGATION;  Surgeon: Lanis Pupa, MD;  Location: [facility] OR;  Service: Neurosurgery;  Laterality: N/A;   APPLICATION OF CRANIAL NAVIGATION N/A 08/08/2022   Procedure: APPLICATION OF CRANIAL NAVIGATION;  Surgeon: Lanis Pupa, MD;  Location: [facility] OR;  Service: Neurosurgery;  Laterality: N/A;   BIOPSY  06/09/2023   Procedure: BIOPSY;  Surgeon: Saintclair Jasper, MD;  Location: [facility];  Service: Gastroenterology;;   CARDIAC CATHETERIZATION N/A 05/01/2015   Procedure: Left Heart Cath and Coronary Angiography;  Surgeon: Victory LELON Sharps, MD;  Location: [facility] INVASIVE CV LAB;  Service: Cardiovascular;  Laterality: N/A;   COLONOSCOPY WITH PROPOFOL  N/A 06/09/2023   Procedure: COLONOSCOPY WITH PROPOFOL ;  Surgeon: Saintclair Jasper, MD;  Location: [facility];  Service: Gastroenterology;  Laterality: N/A;   CRANIOTOMY N/A 04/11/2022   Procedure: STEREOTACTIC SUBOCCIPITAL CRANIOTOMY FOR RESECTION OF ARTERIO-VENOUS MALFORMATION;  Surgeon: Lanis Pupa, MD;  Location: [facility];  Service: Neurosurgery;  Laterality: N/A;   ESOPHAGOGASTRODUODENOSCOPY (EGD) WITH PROPOFOL  N/A 06/09/2023   Procedure: ESOPHAGOGASTRODUODENOSCOPY (EGD) WITH PROPOFOL ;  Surgeon: Saintclair Jasper, MD;  Location: [facility];  Service: Gastroenterology;  Laterality: N/A;   FRACTURE SURGERY     left femur fracture x 3   HEMOSTASIS CLIP PLACEMENT  06/09/2023   Procedure: HEMOSTASIS CLIP PLACEMENT;  Surgeon: Saintclair Jasper, MD;  Location: [facility];  Service: Gastroenterology;;   IR ANGIO EXTERNAL CAROTID SEL EXT CAROTID BILAT MOD SED  01/28/2022   IR ANGIO INTRA EXTRACRAN SEL INTERNAL CAROTID BILAT MOD SED   01/28/2022   IR ANGIO INTRA EXTRACRAN SEL INTERNAL CAROTID BILAT MOD SED  04/16/2022   IR ANGIO VERTEBRAL SEL VERTEBRAL BILAT MOD SED  01/28/2022   IR ANGIO VERTEBRAL SEL VERTEBRAL BILAT MOD SED  04/16/2022   KNEE ARTHROSCOPY     right x 4   LAPAROSCOPIC REVISION VENTRICULAR-PERITONEAL (V-P) SHUNT N/A 08/08/2022   Procedure: LAPAROSCOPIC VENTRICULAR-PERITONEAL (V-P) SHUNT;  Surgeon: Vanderbilt Ned, MD;  Location: [facility];  Service: General;  Laterality: N/A;   LUMBAR LAMINECTOMY/DECOMPRESSION MICRODISCECTOMY N/A 07/18/2022   Procedure: Repair of Pseudomeningiocele posterior;  Surgeon: Lanis Pupa, MD;  Location: [facility];  Service: Neurosurgery;  Laterality: N/A;   NASAL SEPTOPLASTY W/ TURBINOPLASTY  06/25/2011   Procedure: NASAL SEPTOPLASTY WITH TURBINATE REDUCTION;  Surgeon: Alm Bouche, MD;  Location[facility];  Service: ENT;  Laterality: Bilateral;   PLACEMENT OF LUMBAR DRAIN N/A 07/18/2022   Procedure: PLACEMENT OF LUMBAR DRAIN;  Surgeon: Lanis Pupa, MD;  Location: [facility];  Service: Neurosurgery;  Laterality: N/A;   POLYPECTOMY  06/09/2023   Procedure: POLYPECTOMY;  Surgeon: Saintclair Jasper, MD;  Location: [facility];  Service: Gastroenterology;;   TONSILLECTOMY     TOTAL KNEE ARTHROPLASTY Right 06/01/2015   Procedure: RIGHT TOTAL KNEE ARTHROPLASTY;  Surgeon: Lonni CINDERELLA Poli, MD;  Location: [facility];  Service: Orthopedics;  Laterality: Right;  Block+general   VENTRICULOPERITONEAL SHUNT Right 08/08/2022   Procedure: LAP ASSITED SHUNT INSERTION VENTRICULAR-PERITONEAL;  Surgeon: Lanis Pupa, MD;  Location: [facility];  Service: Neurosurgery;  Laterality: Right;    Medications: Current Outpatient Medications  Medication Sig Dispense Refill   acetaminophen  (TYLENOL ) 325 MG tablet Take 2 tablets (650 mg total) by mouth 3 (three) times daily.     alendronate (FOSAMAX) 10 MG tablet Take 10 mg by mouth daily before breakfast. Take with a full glass of water  on an empty stomach.      artificial tears (LACRILUBE) OINT ophthalmic ointment Place into both eyes 2 (two) times daily.     aspirin  EC 81 MG tablet Take 1 tablet (81 mg total) by mouth daily. Swallow whole. 30 tablet 12   calcium  carbonate (TUMS - DOSED IN MG ELEMENTAL CALCIUM ) 500 MG chewable tablet Chew 1 tablet (200 mg of elemental calcium  total) by mouth daily with supper.     Cholecalciferol (VITAMIN D3) 50 MCG (2000 UT) TABS Take 2,000 Units by mouth daily.     famotidine (PEPCID) 40 MG tablet 1 tablet Orally Once a day for 30 days     folic acid  (FOLVITE ) 1 MG tablet Take 1 mg by mouth daily.     methotrexate  (RHEUMATREX) 2.5 MG tablet Take 20 mg by mouth once a week. Takes 8 tablets(20mg ) every Saturday.     Caution:Chemotherapy. Protect from light.     metoprolol  succinate (TOPROL -XL) 25 MG 24 hr tablet TAKE 1 TABLET DAILY 90 tablet 3   Multiple Vitamins-Minerals (PRESERVISION AREDS 2 PO) Take 2 capsules by mouth daily.     predniSONE  (STERAPRED UNI-PAK 21 TAB) 5 MG (21) TBPK tablet Take 5 mg by mouth every morning.     RITUXAN  500 MG/50ML injection See admin instructions. Every 6 months 2 dose infusion Ruxience      rosuvastatin  (CRESTOR ) 20 MG tablet Take 1 tablet (20 mg total) by mouth daily. Patient must schedule annual appointment for further refills first attempt 30 tablet 0   sertraline  (ZOLOFT ) 20 MG/ML concentrated solution Take 25 mg by mouth daily.     spironolactone  (ALDACTONE ) 25 MG tablet 12.5 mg. 1/2 tab daily     sulfaSALAzine  (AZULFIDINE ) 500 MG tablet Take 1,000 mg by mouth 2 (two) times daily.     traMADol  (ULTRAM ) 50 MG tablet Take 1 tablet (50 mg total) by mouth every 12 (twelve) hours as needed for moderate pain. (Patient taking differently: Take 50 mg by mouth as needed for moderate pain (pain score 4-6).) 60 tablet 2   traZODone  (DESYREL ) 50 MG tablet TAKE 1.5 TABLETS (75 MG TOTAL) BY MOUTH AT BEDTIME AS NEEDED. FOR SLEEP 135 tablet 5   White Petrolatum -Mineral Oil (GENTEAL TEARS NIGHT-TIME  OP) Apply to eye  at bedtime.     No current facility-administered medications for this visit.    Allergies  Allergen Reactions   Hydroxychloroquine      Other Reaction(s): cardiolmyopathy    Diagnoses:  Major depression  Plan of Care: Outpatient psychotherapy and medication management as needed. Initial Note: (With wife [name]). She says that she has hoped Christopher Yu would get into therapy. He has Ataxic Tremor. He told Dr. Evonnie that he would like medication to just sit and do nothing. He says he always had golf to rely on and now cannot play. In December, he went n for a surgery to remove an avm from brain. Was told it was a routine surgery and was told that he needs to get it out. Was told it would be a short recovery and he would be back to work. 1 1/2 hour surgery turned into 6 hour surgery and he had complications. He had extra cerebral fluid and Dr. said it would absorb and just wait. He kept declining and by March, they went in to try and drain the extra fluid. He was in hospital for a week. Wife noticed that during the hospital stay, his speech declined and he was clearly weak. Nurses were trying to discharge him and she protested, but they sent him home anyway. He declined, and could not sit, stand, talk or walk and she ended up calling ambulance. In ER, she ws told he had a stroke. He was in ER 24 hours before getting a room in Neuro area. Head of stroke team came in and said it was not a stroke, but complications from surgery. She was blamed by medical team for not speaking up before being initially discharged. He ended up in a rehab for 2 weeks and told them he needed another surgery. Was told he had hydrocephalus and need a shunt to drain liquid off his brain. She felt a lot of pressure from team that he needed surgery. She wanted a second opinion and had rouble getting it. Spoke to another Dr. and they decided to get the third surgery. He was in OT,PT and Speech Therapy. During therapy, he  started having a tremor. They ended up being referred to Dr. Evonnie, who has now told him that treating his tremor is tricky and will take time.  Zamir is no longer in OT,PT speech therapy. He is supposed to be doing exercises on his own, but has not been emotionally able to ge himself to do it. He has persistent headaches as well. Just had a CAT Scan and awaiting the results. His headaches are worse and they are concerned. He also has rhumatory arthritis and it is flaring up because he had to stop meds. He has cardiomyopathy, macular degeneration on top of all of these other conditions.  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 He has been approved for disability and are working to get social security disability. She works part time. They have 2 daughters, one with diabetes and arthritis. She is heading to [employer] this Fall and wants to work in Research scientist (physical sciences). Other daughter is 22 and is our introvert with lots of anxiety. She is in therapy and has mental health issues that is being treated. She will be a Holiday representative at the middle college at [employer].  They are told that medicine will not control his condition, but they will have to try to retrain his brain. Suggested that they  work to get back into OT and PT. She has a hard time motivating him. He admits that he keeps things inside. She says he is not a good comunicator. He will not share things like if he is in pain or hurts himself.   Tx. Plan/Goals: Patient reports depressive symptoms secondary to the medical trauma he has suffered and the debilitating physical results. He is seeking therapy to resolve his feelings of helplessness and hopelessness. Also want assistance in improving his drive/motivation in daily activity. Will engage in therapy that utilizes behavioral approaches to mitigate symptoms along with insight oriented counseling. Will also incorporate some conjoint sessions with spouse  as needed, given her integral involvement with his care. Patient agrees with plan for treatment. Goal Date is 12-25    Patient agrees to be seen in video (caregility) session and is aware of the limitations of this platform. He is at home and provider in home office. Session Note:  Patient states he is doing the regular things around the house. He claims that he does not mind being at home most of the time. Wants to start reading more to be occupied. We talked about his desire to fix his balance so he can resume golf. Plan is to address this by finding the appropriate physical therapist.  Will call his cell at [phone].                                                                                                    CONI ALM KERNS, PhD 5:10p-6:00 50 minutes

## 2023-11-10 NOTE — Patient Instructions (Addendum)
 Clear Speech: Speaking like someone is hard of hearing or is having a hard time understanding you.   Functional Phrases: Are you ready to eat?  I can do it on own.  Oops I spilt something again.  Where are you going?  Did you feed the dog?  Did you give the dog his medicine?  I want to kick the dog.  I'm going to check the mail. I mowed the yard today 10. I watered your garden today  Complete 2-3 times per day EMST: complete 5 sets of 5 repetitions Effortful swallow: complete 30 hard swallows (sip, swallow, swallow, repeat)  Half-swallow (hold at the top, tongue pressed hard to roof of mouth): 15 repetitions

## 2023-11-10 NOTE — Therapy (Unsigned)
 OUTPATIENT SPEECH LANGUAGE PATHOLOGY TREATMENT NOTE   Patient Name: Christopher Yu MRN: [id number] DOB:November 30, 1960, 63 y.o., male Today's Date: 11/10/2023  PCP: Dwight Trula SQUIBB, MD REFERRING PROVIDER: Emeline Joesph BROCKS, DO  END OF SESSION:  End of Session - 11/10/23 1529     Visit Number 3    Number of Visits 17    Date for SLP Re-Evaluation 11/30/23   extened due to scheduling   Authorization Type [employer]    SLP Start Time 1529    SLP Stop Time  1610    SLP Time Calculation (min) 41 min    Activity Tolerance Patient tolerated treatment well          Past Medical History:  Diagnosis Date   Anxiety    Arthritis, rheumatoid (HCC)    dx 1988   CAD (coronary artery disease), native coronary artery    Mild LAD disease with calcification noted at cath 12//27/16    Cardiomyopathy (HCC)    Cardiomyopathy (HCC)    Chronic systolic heart failure (HCC) 05/01/2015   Depression    Deviated nasal septum 06/25/2011   Headache    High cholesterol    Hyperlipidemia    Impingement syndrome of left shoulder 12/22/2017   Macular degeneration    Nausea and vomiting 08/28/2022   Rheumatoid aortitis    Sleep apnea    does not use cpap - UNABLE TO TOLERATE MASK   Sleep apnea in adult    deviated septum repaired, most recent sleep study was negative   Status post total right knee replacement 06/01/2015   Past Surgical History:  Procedure Laterality Date   ANKLE FUSION Right 05/05/2012   related to his arthritis   ANKLE FUSION Left 05/25/2013   related to his arthritis   ANKLE SURGERY Bilateral    APPLICATION OF CRANIAL NAVIGATION N/A 04/11/2022   Procedure: APPLICATION OF CRANIAL NAVIGATION;  Surgeon: Lanis Pupa, MD;  Location: [facility];  Service: Neurosurgery;  Laterality: N/A;   APPLICATION OF CRANIAL NAVIGATION N/A 08/08/2022   Procedure: APPLICATION OF CRANIAL NAVIGATION;  Surgeon: Lanis Pupa, MD;  Location: [facility];  Service: Neurosurgery;  Laterality: N/A;    BIOPSY  06/09/2023   Procedure: BIOPSY;  Surgeon: Saintclair Jasper, MD;  Location: [facility];  Service: Gastroenterology;;   CARDIAC CATHETERIZATION N/A 05/01/2015   Procedure: Left Heart Cath and Coronary Angiography;  Surgeon: Victory LELON Sharps, MD;  Location: [facility] INVASIVE CV LAB;  Service: Cardiovascular;  Laterality: N/A;   COLONOSCOPY WITH PROPOFOL  N/A 06/09/2023   Procedure: COLONOSCOPY WITH PROPOFOL ;  Surgeon: Saintclair Jasper, MD;  Location: [facility];  Service: Gastroenterology;  Laterality: N/A;   CRANIOTOMY N/A 04/11/2022   Procedure: STEREOTACTIC SUBOCCIPITAL CRANIOTOMY FOR RESECTION OF ARTERIO-VENOUS MALFORMATION;  Surgeon: Lanis Pupa, MD;  Location: [facility];  Service: Neurosurgery;  Laterality: N/A;   ESOPHAGOGASTRODUODENOSCOPY (EGD) WITH PROPOFOL  N/A 06/09/2023   Procedure: ESOPHAGOGASTRODUODENOSCOPY (EGD) WITH PROPOFOL ;  Surgeon: Saintclair Jasper, MD;  Location: [facility];  Service: Gastroenterology;  Laterality: N/A;   FRACTURE SURGERY     left femur fracture x 3   HEMOSTASIS CLIP PLACEMENT  06/09/2023   Procedure: HEMOSTASIS CLIP PLACEMENT;  Surgeon: Saintclair Jasper, MD;  Location: [facility];  Service: Gastroenterology;;   IR ANGIO EXTERNAL CAROTID SEL EXT CAROTID BILAT MOD SED  01/28/2022   IR ANGIO INTRA EXTRACRAN SEL INTERNAL CAROTID BILAT MOD SED  01/28/2022   IR ANGIO INTRA EXTRACRAN SEL INTERNAL CAROTID BILAT MOD SED  04/16/2022   IR ANGIO VERTEBRAL SEL VERTEBRAL  BILAT MOD SED  01/28/2022   IR ANGIO VERTEBRAL SEL VERTEBRAL BILAT MOD SED  04/16/2022   KNEE ARTHROSCOPY     right x 4   LAPAROSCOPIC REVISION VENTRICULAR-PERITONEAL (V-P) SHUNT N/A 08/08/2022   Procedure: LAPAROSCOPIC VENTRICULAR-PERITONEAL (V-P) SHUNT;  Surgeon: Vanderbilt Ned, MD;  Location: [facility];  Service: General;  Laterality: N/A;   LUMBAR LAMINECTOMY/DECOMPRESSION MICRODISCECTOMY N/A 07/18/2022   Procedure: Repair of Pseudomeningiocele posterior;  Surgeon: Lanis Pupa, MD;  Location: [facility];  Service:  Neurosurgery;  Laterality: N/A;   NASAL SEPTOPLASTY W/ TURBINOPLASTY  06/25/2011   Procedure: NASAL SEPTOPLASTY WITH TURBINATE REDUCTION;  Surgeon: Alm Bouche, MD;  Location[facility];  Service: ENT;  Laterality: Bilateral;   PLACEMENT OF LUMBAR DRAIN N/A 07/18/2022   Procedure: PLACEMENT OF LUMBAR DRAIN;  Surgeon: Lanis Pupa, MD;  Location: [facility];  Service: Neurosurgery;  Laterality: N/A;   POLYPECTOMY  06/09/2023   Procedure: POLYPECTOMY;  Surgeon: Saintclair Jasper, MD;  Location: [facility];  Service: Gastroenterology;;   TONSILLECTOMY     TOTAL KNEE ARTHROPLASTY Right 06/01/2015   Procedure: RIGHT TOTAL KNEE ARTHROPLASTY;  Surgeon: Lonni CINDERELLA Poli, MD;  Location: [facility];  Service: Orthopedics;  Laterality: Right;  Block+general   VENTRICULOPERITONEAL SHUNT Right 08/08/2022   Procedure: LAP ASSITED SHUNT INSERTION VENTRICULAR-PERITONEAL;  Surgeon: Lanis Pupa, MD;  Location: [facility];  Service: Neurosurgery;  Laterality: Right;   Patient Active Problem List   Diagnosis Date Noted   Dysphagia 05/04/2023   At risk for central sleep apnea 03/05/2023   Chronic nonintractable headache 02/02/2023   Insomnia 10/20/2022   Anxiety and depression 10/20/2022   Nausea and vomiting 08/28/2022   Adjustment disorder 08/06/2022   Protein-calorie malnutrition, severe 07/31/2022   Cerebellar stroke (HCC) 07/29/2022   Ischemic cerebrovascular accident (CVA) (HCC) 07/28/2022   Prolonged QT interval 07/28/2022   Pseudomeningocele 07/18/2022   Pseudomeningocele due to surgical procedure 07/18/2022   Status post craniotomy 04/11/2022   AVM (arteriovenous malformation) brain 04/11/2022   Impingement syndrome of left shoulder 12/22/2017   Impingement syndrome of right shoulder 12/22/2017   Rheumatoid arthritis involving right knee (HCC) 06/01/2015   Status post total right knee replacement 06/01/2015   Cardiomyopathy (HCC) 05/02/2015   Hyperlipidemia    Sleep apnea in adult    Rheumatoid  arthritis (HCC)    Chronic systolic heart failure (HCC) 05/01/2015   CAD (coronary artery disease), native coronary artery     ONSET DATE: 08/13/2023 (referral date); CVA 07/28/22  REFERRING DIAG: R13.10 (ICD-10-CM) - Dysphagia, unspecified type I63.9 (ICD-10-CM) - Cerebellar stroke (HCC)  THERAPY DIAG:  Dysphagia, oropharyngeal phase  Dysarthria and anarthria  Rationale for Evaluation and Treatment: Rehabilitation  SUBJECTIVE:   SUBJECTIVE STATEMENT: pt appears in pleasant mood.   Pt accompanied by: self  PERTINENT HISTORY: Mr. Niemczyk is seen today referred for  MBSS and outpatient ST as he and his wife both reported increased coughing with solids and thin liquids, which prompted scheduling MBSS. Pt has a hx of dysarthria and dysphagia following stroke in March 2024. Prior MBSS completed 08/18/22 with recommendation of a regular diet, nectar-thick liquids, and L head turn strategy. He completed outpatient SLP tx in June 2024 and self-advanced diet to regular solids and thin liquids and he had elected to defer head turn left with swallow, therefore a follow up MBSS was forgone. Per Mr. Salemi and wife, he has not had any concerns for pneumonia or pulmonary infection since advancing to thin liquids, despite coughing occurences.  PAIN:  Are  you having pain? No  FALLS: Has patient fallen in last 6 months?  No  LIVING ENVIRONMENT: Lives with: lives with their family Lives in: House/apartment  PLOF:  Level of assistance: Independent with ADLs, Needed assistance with IADLS Employment: On disability  PATIENT GOALS: To improve my ability to communicate  OBJECTIVE:  Note: Objective measures were completed at Evaluation unless otherwise noted. OBJECTIVE:   INSTRUMENTAL SWALLOW STUDY FINDINGS (MBSS)  Objective swallow impairments: There was frank aspiration with delayed cough response of thin liquids by cup with head in neutral position.  -No penetration or aspiration of  pudding or cracker trial. -L head turn significantly aided airway protection with thin liquids. While it did not completely eliminate aspiration risk, the amount of penetration/aspiration was significantly reduced. Continued to observe deep, stagnant penetration of thin liquids (in isolation) with use of L head turn.  -L Head turn plus Chin tuck resulted in trace silent aspiration of thin liquids. Chin tuck not recommended.  Objective recommended compensations:    -Regular diet  -Thin liquids with L Head Turn -PO meds whole in pudding or applesauce -Consider nectar-thick liquids if Mr. Brar has difficulty remembering L head turn strategy or if coughing with thin liquids becomes bothersome.  -Follow up with OP SLP    COGNITION: Overall cognitive status: Impaired Areas of impairment:  Attention: Impaired: Alternating, Divided Awareness: Impaired: Anticipatory Functional deficits:   SUBJECTIVE DYSPHAGIA REPORTS:  Date of onset: 07/28/22 Reported symptoms: coughing with liquids and choking with liquids  Current diet: regular and thin liquids  Co-morbid voice changes: No  FACTORS WHICH MAY INCREASE RISK OF ADVERSE EVENT IN PRESENCE OF ASPIRATION:  General health: poor general health  Risk factors: none evident     ORAL MOTOR EXAMINATION: Overall status: Impaired:   Labial: Bilateral (Coordination) Lingual: Bilateral (Coordination) Velum: Coordination Comments:   CLINICAL SWALLOW ASSESSMENT:   Dentition: adequate natural dentition Vocal quality at baseline: normal Patient directly observed with POs: Yes: thin liquids  Feeding: able to feed self Liquids provided by: coke bottle [employer] Protocol: n/a Oral phase signs and symptoms: prolonged bolus formation Pharyngeal phase signs and symptoms: immediate cough                                                                                                                          TREATMENT DATE:   11/10/23: (swallow) SLP  led pt through dysphagia exercises targeting pharyngeal strengthening and coordination of swallow. Using thin liquid bolus every third swallow, pt completes x30 effortful swallow with rare overt s/sx aspiration (strong cough). SLP conducted assessment of MEP for EMST. Pt's maximum expiratory pressure today was 120 cm H2O. EMST 150 device was started at 75% MEP at 90 cm H2O, however pt unable to complete with good form. Setting modified to 75 cm H2O. SLP provided demonstration and verbal instruction to utilize EMST device. Pt completed 5 repetitions over 5 sets during our session. HEP initiated for 5 sets of 5 reps for 5  days/week. (Speech) introduced clear speech and generated x10 functional phrases. Pt completed with rare min-A for use of clear speech and intent for each word to enhance clarity. He did each phrase x4 times. Provided paragraph reading with decreased carryover of clear speech, benefits from SLP feedback. Will continue to address.    11/05/23: Swallow: SLP reviewed all dysphagia exercises and led pt through iteration of the same to address physiologic impairments. Pt completed x20 effortful swallow with intermittent coughing exhibited given thin liquid bolus; x1 successful Masako, multiple unsuccessful attempts; x6 Mendelsohns with 3 second hold; x5 superior lingual press with 3 second hold; :60 CTAR with fist using active resistance.   Speech: reviewed rec to get attention prior to speaking to ensure listener is paying attention. Introduced clear speech, as pt report hard time carrying over dysarthria strategies. Will plan to utilize in reading and phrase generation next session.   09/07/23: Eval completed -   Swallow:Initiated training in HEP for dysphagia - pt required frequent mod A for effortful swallow, masako, CTAR - unable to perform Mendelson with max A. Millard states I can't swallow 5x in a row. Reviewed swallowing precautions - with verbal cues Kord demonstrated head turn left 5/5  sips and verbalized effortful swallow. Effortful swallow employed to support mindfulness and intent when swallowing. He states he forgets to turn his head left - we generated strategies of placing signs to remind him, placing TV to the left when he eats (although I recommend eliminating distractions with meals)and eating at a table. He reports biting his cheeks and tongue during meals. Instructed in using strategy of chewing and swallowing with intent to improve awareness. Education provided on using head turn and swallowing with intent with mixed consistencies as he does eat cereal and soup regularly.  Speech: initiated training in strategies to support intelligibility and expression including saying Liz's name and making sure he gets her attention before he starts speaking, as well as saying 1`-2 key words rather than pointing.    PATIENT EDUCATION: Education details: See Treatment above; HEP for dysphagia; swallow precautions; diet modifications; communication strategies Person educated: Patient and Spouse Education method: Explanation, Demonstration, Verbal cues, and Handouts Education comprehension: verbalized understanding, returned demonstration, verbal cues required, and needs further education   ASSESSMENT:  CLINICAL IMPRESSION: Patient is a 63 y.o. male who was seen today for mild to moderate oropharyngeal dysphagia; mild ataxic dysarthria; mild cognitive communication impairment. He is well known to us  from prior course of outpatient ST May and June of 2024. He has not carried over recommendation for head turn left and reports increased coughing with meals. [name] worries about him choking when she is not home. He is eating only soft foods when she is at work. Joaopedro reports he is having difficulty communicating quickly and [name] endorses he is not talking as much, but pointing, even to get her attention. She states that he just starts talking and she doesn't know who he is talking to and she  has to request information be repeated. Overall poor anticipatory awareness in lack of carrying over head turn left, not getting attention before speaking, biting his cheeks and tongue. I recommend skilled ST to maximize safety of swallow, intelligibility, verbal expression for safety, independence and to reduce caregiver burden  OBJECTIVE IMPAIRMENTS: include executive functioning, expressive language, dysarthria, and dysphagia. These impairments are limiting patient from return to work, managing medications, managing finances, ADLs/IADLs, effectively communicating at home and in community, and safety when swallowing. Factors affecting potential to  achieve goals and functional outcome are cooperation/participation level. Patient will benefit from skilled SLP services to address above impairments and improve overall function.  REHAB POTENTIAL: Fair due to time post onset, cognitive impairments   GOALS: Goals reviewed with patient? Yes  SHORT TERM GOALS: Target date: 11/12/23 (extended due to scheduling)  Pt will complete HEP for dysphagia with rare min A  Baseline: Goal status: INITIAL  2.  Pt will follow swallow precautions and diet modifications with rare min A Baseline:  Goal status: INITIAL  3.  Pt will verbally get person's attention and eye contact prior to speaking with rare min A Baseline:  Goal status: INITIAL  4.  Pt will report 50% reduced biting of his cheek and tongue while eating by chewing and swallowing with intent  Baseline:  Goal status: INITIAL    LONG TERM GOALS: Target date: 12/02/23  Pt will complete HEP at least 1x a day for 1 week Baseline:  Goal status: INITIAL  2.  Pt will carryover swallow precautions including eating with intent and head turn left with mod I Baseline:  Goal status: INITIAL  3.  Spouse will report 50% need to request repetition or clarification when Lavar is talking Baseline:  Goal status: INITIAL  4.  Pt and spouse will  carryover 2 compensatory strategies including environmental modifications to support processing and understanding of conversations Baseline:  Goal status: INITIAL   PLAN:  SLP FREQUENCY: 2x/week  SLP DURATION: 6 weeks - POC dates extended due to scheduling  PLANNED INTERVENTIONS: Aspiration precaution training, Pharyngeal strengthening exercises, Diet toleration management , Language facilitation, Environmental controls, Trials of upgraded texture/liquids, Cueing hierachy, Cognitive reorganization, Internal/external aids, Functional tasks, Multimodal communication approach, SLP instruction and feedback, Compensatory strategies, Patient/family education, [phone] Treatment of speech (30 or 45 min) , and [id number] Treatment of swallowing function    Harlene LITTIE Ned, CCC-SLP 11/10/2023, 3:29 PM

## 2023-11-11 ENCOUNTER — Ambulatory Visit: Admitting: Psychology

## 2023-11-11 ENCOUNTER — Encounter: Payer: Self-pay | Admitting: Physical Medicine and Rehabilitation

## 2023-11-11 ENCOUNTER — Encounter: Payer: Self-pay | Admitting: Neurology

## 2023-11-11 NOTE — Telephone Encounter (Signed)
 Patient was seen last year and was PRN. Would you want to schedule this patient for a follow up with you or should I direct him to his PCP

## 2023-11-12 ENCOUNTER — Ambulatory Visit: Admitting: Speech Pathology

## 2023-11-12 DIAGNOSIS — R471 Dysarthria and anarthria: Secondary | ICD-10-CM

## 2023-11-12 DIAGNOSIS — R1312 Dysphagia, oropharyngeal phase: Secondary | ICD-10-CM | POA: Diagnosis not present

## 2023-11-12 NOTE — Therapy (Signed)
 OUTPATIENT SPEECH LANGUAGE PATHOLOGY TREATMENT NOTE   Patient Name: Christopher Yu MRN: [id number] DOB:03-Feb-1961, 63 y.o., male Today's Date: 11/12/2023  PCP: Dwight Trula SQUIBB, MD REFERRING PROVIDER: Emeline Joesph BROCKS, DO  END OF SESSION:  End of Session - 11/12/23 1529     Visit Number 4    Number of Visits 17    Date for SLP Re-Evaluation 11/30/23   extened due to scheduling   Authorization Type [employer]    SLP Start Time 1525    SLP Stop Time  1610    SLP Time Calculation (min) 45 min    Activity Tolerance Patient tolerated treatment well          Past Medical History:  Diagnosis Date   Anxiety    Arthritis, rheumatoid (HCC)    dx 1988   CAD (coronary artery disease), native coronary artery    Mild LAD disease with calcification noted at cath 12//27/16    Cardiomyopathy (HCC)    Cardiomyopathy (HCC)    Chronic systolic heart failure (HCC) 05/01/2015   Depression    Deviated nasal septum 06/25/2011   Headache    High cholesterol    Hyperlipidemia    Impingement syndrome of left shoulder 12/22/2017   Macular degeneration    Nausea and vomiting 08/28/2022   Rheumatoid aortitis    Sleep apnea    does not use cpap - UNABLE TO TOLERATE MASK   Sleep apnea in adult    deviated septum repaired, most recent sleep study was negative   Status post total right knee replacement 06/01/2015   Past Surgical History:  Procedure Laterality Date   ANKLE FUSION Right 05/05/2012   related to his arthritis   ANKLE FUSION Left 05/25/2013   related to his arthritis   ANKLE SURGERY Bilateral    APPLICATION OF CRANIAL NAVIGATION N/A 04/11/2022   Procedure: APPLICATION OF CRANIAL NAVIGATION;  Surgeon: Lanis Pupa, MD;  Location: [facility];  Service: Neurosurgery;  Laterality: N/A;   APPLICATION OF CRANIAL NAVIGATION N/A 08/08/2022   Procedure: APPLICATION OF CRANIAL NAVIGATION;  Surgeon: Lanis Pupa, MD;  Location: [facility];  Service: Neurosurgery;  Laterality: N/A;    BIOPSY  06/09/2023   Procedure: BIOPSY;  Surgeon: Saintclair Jasper, MD;  Location: [facility];  Service: Gastroenterology;;   CARDIAC CATHETERIZATION N/A 05/01/2015   Procedure: Left Heart Cath and Coronary Angiography;  Surgeon: Victory LELON Sharps, MD;  Location: [facility];  Service: Cardiovascular;  Laterality: N/A;   COLONOSCOPY WITH PROPOFOL  N/A 06/09/2023   Procedure: COLONOSCOPY WITH PROPOFOL ;  Surgeon: Saintclair Jasper, MD;  Location: [facility];  Service: Gastroenterology;  Laterality: N/A;   CRANIOTOMY N/A 04/11/2022   Procedure: STEREOTACTIC SUBOCCIPITAL CRANIOTOMY FOR RESECTION OF ARTERIO-VENOUS MALFORMATION;  Surgeon: Lanis Pupa, MD;  Location: [facility];  Service: Neurosurgery;  Laterality: N/A;   ESOPHAGOGASTRODUODENOSCOPY (EGD) WITH PROPOFOL  N/A 06/09/2023   Procedure: ESOPHAGOGASTRODUODENOSCOPY (EGD) WITH PROPOFOL ;  Surgeon: Saintclair Jasper, MD;  Location: [facility];  Service: Gastroenterology;  Laterality: N/A;   FRACTURE SURGERY     left femur fracture x 3   HEMOSTASIS CLIP PLACEMENT  06/09/2023   Procedure: HEMOSTASIS CLIP PLACEMENT;  Surgeon: Saintclair Jasper, MD;  Location: [facility];  Service: Gastroenterology;;   IR ANGIO EXTERNAL CAROTID SEL EXT CAROTID BILAT MOD SED  01/28/2022   IR ANGIO INTRA EXTRACRAN SEL INTERNAL CAROTID BILAT MOD SED  01/28/2022   IR ANGIO INTRA EXTRACRAN SEL INTERNAL CAROTID BILAT MOD SED  04/16/2022   IR ANGIO VERTEBRAL SEL VERTEBRAL  BILAT MOD SED  01/28/2022   IR ANGIO VERTEBRAL SEL VERTEBRAL BILAT MOD SED  04/16/2022   KNEE ARTHROSCOPY     right x 4   LAPAROSCOPIC REVISION VENTRICULAR-PERITONEAL (V-P) SHUNT N/A 08/08/2022   Procedure: LAPAROSCOPIC VENTRICULAR-PERITONEAL (V-P) SHUNT;  Surgeon: Vanderbilt Ned, MD;  Location: [facility];  Service: General;  Laterality: N/A;   LUMBAR LAMINECTOMY/DECOMPRESSION MICRODISCECTOMY N/A 07/18/2022   Procedure: Repair of Pseudomeningiocele posterior;  Surgeon: Lanis Pupa, MD;  Location: [facility];  Service:  Neurosurgery;  Laterality: N/A;   NASAL SEPTOPLASTY W/ TURBINOPLASTY  06/25/2011   Procedure: NASAL SEPTOPLASTY WITH TURBINATE REDUCTION;  Surgeon: Alm Bouche, MD;  Location[facility];  Service: ENT;  Laterality: Bilateral;   PLACEMENT OF LUMBAR DRAIN N/A 07/18/2022   Procedure: PLACEMENT OF LUMBAR DRAIN;  Surgeon: Lanis Pupa, MD;  Location: [facility];  Service: Neurosurgery;  Laterality: N/A;   POLYPECTOMY  06/09/2023   Procedure: POLYPECTOMY;  Surgeon: Saintclair Jasper, MD;  Location: [facility];  Service: Gastroenterology;;   TONSILLECTOMY     TOTAL KNEE ARTHROPLASTY Right 06/01/2015   Procedure: RIGHT TOTAL KNEE ARTHROPLASTY;  Surgeon: Lonni CINDERELLA Poli, MD;  Location: [facility];  Service: Orthopedics;  Laterality: Right;  Block+general   VENTRICULOPERITONEAL SHUNT Right 08/08/2022   Procedure: LAP ASSITED SHUNT INSERTION VENTRICULAR-PERITONEAL;  Surgeon: Lanis Pupa, MD;  Location: [facility];  Service: Neurosurgery;  Laterality: Right;   Patient Active Problem List   Diagnosis Date Noted   Dysphagia 05/04/2023   At risk for central sleep apnea 03/05/2023   Chronic nonintractable headache 02/02/2023   Insomnia 10/20/2022   Anxiety and depression 10/20/2022   Nausea and vomiting 08/28/2022   Adjustment disorder 08/06/2022   Protein-calorie malnutrition, severe 07/31/2022   Cerebellar stroke (HCC) 07/29/2022   Ischemic cerebrovascular accident (CVA) (HCC) 07/28/2022   Prolonged QT interval 07/28/2022   Pseudomeningocele 07/18/2022   Pseudomeningocele due to surgical procedure 07/18/2022   Status post craniotomy 04/11/2022   AVM (arteriovenous malformation) brain 04/11/2022   Impingement syndrome of left shoulder 12/22/2017   Impingement syndrome of right shoulder 12/22/2017   Rheumatoid arthritis involving right knee (HCC) 06/01/2015   Status post total right knee replacement 06/01/2015   Cardiomyopathy (HCC) 05/02/2015   Hyperlipidemia    Sleep apnea in adult    Rheumatoid  arthritis (HCC)    Chronic systolic heart failure (HCC) 05/01/2015   CAD (coronary artery disease), native coronary artery     ONSET DATE: 08/13/2023 (referral date); CVA 07/28/22  REFERRING DIAG: R13.10 (ICD-10-CM) - Dysphagia, unspecified type I63.9 (ICD-10-CM) - Cerebellar stroke (HCC)  THERAPY DIAG:  Dysphagia, oropharyngeal phase  Dysarthria and anarthria  Rationale for Evaluation and Treatment: Rehabilitation  SUBJECTIVE:   SUBJECTIVE STATEMENT: pt appears in pleasant mood.   Pt accompanied by: self  PERTINENT HISTORY: Mr. Kissoon is seen today referred for  MBSS and outpatient ST as he and his wife both reported increased coughing with solids and thin liquids, which prompted scheduling MBSS. Pt has a hx of dysarthria and dysphagia following stroke in March 2024. Prior MBSS completed 08/18/22 with recommendation of a regular diet, nectar-thick liquids, and L head turn strategy. He completed outpatient SLP tx in June 2024 and self-advanced diet to regular solids and thin liquids and he had elected to defer head turn left with swallow, therefore a follow up MBSS was forgone. Per Mr. Zuercher and wife, he has not had any concerns for pneumonia or pulmonary infection since advancing to thin liquids, despite coughing occurences.  PAIN:  Are  you having pain? No  FALLS: Has patient fallen in last 6 months?  No  LIVING ENVIRONMENT: Lives with: lives with their family Lives in: House/apartment  PLOF:  Level of assistance: Independent with ADLs, Needed assistance with IADLS Employment: On disability  PATIENT GOALS: To improve my ability to communicate  OBJECTIVE:  Note: Objective measures were completed at Evaluation unless otherwise noted. OBJECTIVE:   INSTRUMENTAL SWALLOW STUDY FINDINGS (MBSS)  Objective swallow impairments: There was frank aspiration with delayed cough response of thin liquids by cup with head in neutral position.  -No penetration or aspiration of  pudding or cracker trial. -L head turn significantly aided airway protection with thin liquids. While it did not completely eliminate aspiration risk, the amount of penetration/aspiration was significantly reduced. Continued to observe deep, stagnant penetration of thin liquids (in isolation) with use of L head turn.  -L Head turn plus Chin tuck resulted in trace silent aspiration of thin liquids. Chin tuck not recommended.  Objective recommended compensations:    -Regular diet  -Thin liquids with L Head Turn -PO meds whole in pudding or applesauce -Consider nectar-thick liquids if Mr. Mchaffie has difficulty remembering L head turn strategy or if coughing with thin liquids becomes bothersome.  -Follow up with OP SLP    COGNITION: Overall cognitive status: Impaired Areas of impairment:  Attention: Impaired: Alternating, Divided Awareness: Impaired: Anticipatory Functional deficits:   SUBJECTIVE DYSPHAGIA REPORTS:  Date of onset: 07/28/22 Reported symptoms: coughing with liquids and choking with liquids  Current diet: regular and thin liquids  Co-morbid voice changes: No  FACTORS WHICH MAY INCREASE RISK OF ADVERSE EVENT IN PRESENCE OF ASPIRATION:  General health: poor general health  Risk factors: none evident     ORAL MOTOR EXAMINATION: Overall status: Impaired:   Labial: Bilateral (Coordination) Lingual: Bilateral (Coordination) Velum: Coordination Comments:   CLINICAL SWALLOW ASSESSMENT:   Dentition: adequate natural dentition Vocal quality at baseline: normal Patient directly observed with POs: Yes: thin liquids  Feeding: able to feed self Liquids provided by: coke bottle [facility] Swallow Protocol: n/a Oral phase signs and symptoms: prolonged bolus formation Pharyngeal phase signs and symptoms: immediate cough                                                                                                                          TREATMENT DATE:   11/11/23: (swallow) Pt  completed 25 repetitions of EMST, demonstrating good effort and accurate exercise completion. Pt completed x30 effortful swallow with thin liquid boluses every second swallow.    (Speech): pt spoke to x3 secretaries via the phone this past week, used clear speech and felt he was understood.  Addressed use of dysarthria strategies in conversation with pt evidencing good carryover of targeted skilled, no requests for repetition needed.   11/10/23: (swallow) SLP led pt through dysphagia exercises targeting pharyngeal strengthening and coordination of swallow. Using thin liquid bolus every third swallow, pt completes x30 effortful swallow with rare overt s/sx aspiration (strong  cough). SLP conducted assessment of MEP for EMST. Pt's maximum expiratory pressure today was 120 cm H2O. EMST 150 device was started at 75% MEP at 90 cm H2O, however pt unable to complete with good form. Setting modified to 75 cm H2O. SLP provided demonstration and verbal instruction to utilize EMST device. Pt completed 5 repetitions over 5 sets during our session. HEP initiated for 5 sets of 5 reps for 5 days/week. (Speech) introduced clear speech and generated x10 functional phrases. Pt completed with rare min-A for use of clear speech and intent for each word to enhance clarity. He did each phrase x4 times. Provided paragraph reading with decreased carryover of clear speech, benefits from SLP feedback. Will continue to address.    11/05/23: Swallow: SLP reviewed all dysphagia exercises and led pt through iteration of the same to address physiologic impairments. Pt completed x20 effortful swallow with intermittent coughing exhibited given thin liquid bolus; x1 successful Masako, multiple unsuccessful attempts; x6 Mendelsohns with 3 second hold; x5 superior lingual press with 3 second hold; :60 CTAR with fist using active resistance.   Speech: reviewed rec to get attention prior to speaking to ensure listener is paying attention.  Introduced clear speech, as pt report hard time carrying over dysarthria strategies. Will plan to utilize in reading and phrase generation next session.   09/07/23: Eval completed -   Swallow:Initiated training in HEP for dysphagia - pt required frequent mod A for effortful swallow, masako, CTAR - unable to perform Mendelson with max A. Millard states I can't swallow 5x in a row. Reviewed swallowing precautions - with verbal cues Finbar demonstrated head turn left 5/5 sips and verbalized effortful swallow. Effortful swallow employed to support mindfulness and intent when swallowing. He states he forgets to turn his head left - we generated strategies of placing signs to remind him, placing TV to the left when he eats (although I recommend eliminating distractions with meals)and eating at a table. He reports biting his cheeks and tongue during meals. Instructed in using strategy of chewing and swallowing with intent to improve awareness. Education provided on using head turn and swallowing with intent with mixed consistencies as he does eat cereal and soup regularly.  Speech: initiated training in strategies to support intelligibility and expression including saying Liz's name and making sure he gets her attention before he starts speaking, as well as saying 1`-2 key words rather than pointing.    PATIENT EDUCATION: Education details: See Treatment above; HEP for dysphagia; swallow precautions; diet modifications; communication strategies Person educated: Patient and Spouse Education method: Explanation, Demonstration, Verbal cues, and Handouts Education comprehension: verbalized understanding, returned demonstration, verbal cues required, and needs further education   ASSESSMENT:  CLINICAL IMPRESSION: Patient is a 63 y.o. male who was seen today for mild to moderate oropharyngeal dysphagia; mild ataxic dysarthria; mild cognitive communication impairment. He is well known to us  from prior course of  outpatient ST May and June of 2024. He has not carried over recommendation for head turn left and reports increased coughing with meals. [name] worries about him choking when she is not home. He is eating only soft foods when she is at work. Kiven reports he is having difficulty communicating quickly and [name] endorses he is not talking as much, but pointing, even to get her attention. She states that he just starts talking and she doesn't know who he is talking to and she has to request information be repeated. Overall poor anticipatory awareness in lack of carrying over  head turn left, not getting attention before speaking, biting his cheeks and tongue. I recommend skilled ST to maximize safety of swallow, intelligibility, verbal expression for safety, independence and to reduce caregiver burden  OBJECTIVE IMPAIRMENTS: include executive functioning, expressive language, dysarthria, and dysphagia. These impairments are limiting patient from return to work, managing medications, managing finances, ADLs/IADLs, effectively communicating at home and in community, and safety when swallowing. Factors affecting potential to achieve goals and functional outcome are cooperation/participation level. Patient will benefit from skilled SLP services to address above impairments and improve overall function.  REHAB POTENTIAL: Fair due to time post onset, cognitive impairments   GOALS: Goals reviewed with patient? Yes  SHORT TERM GOALS: Target date: 11/12/23 (extended due to scheduling)  Pt will complete HEP for dysphagia with rare min A  Baseline: Goal status: MET  2.  Pt will follow swallow precautions and diet modifications with rare min A Baseline:  Goal status: INITIAL  3.  Pt will verbally get person's attention and eye contact prior to speaking with rare min A Baseline:  Goal status: PARTIALLY MET  4.  Pt will report 50% reduced biting of his cheek and tongue while eating by chewing and swallowing with  intent  Baseline:  Goal status: MET    LONG TERM GOALS: Target date: 12/02/23  Pt will complete HEP at least 1x a day for 1 week Baseline:  Goal status: INITIAL  2.  Pt will carryover swallow precautions including eating with intent and head turn left with mod I Baseline:  Goal status: INITIAL  3.  Spouse will report 50% need to request repetition or clarification when Ziair is talking Baseline:  Goal status: INITIAL  4.  Pt and spouse will carryover 2 compensatory strategies including environmental modifications to support processing and understanding of conversations Baseline:  Goal status: INITIAL   PLAN:  SLP FREQUENCY: 2x/week  SLP DURATION: 6 weeks - POC dates extended due to scheduling  PLANNED INTERVENTIONS: Aspiration precaution training, Pharyngeal strengthening exercises, Diet toleration management , Language facilitation, Environmental controls, Trials of upgraded texture/liquids, Cueing hierachy, Cognitive reorganization, Internal/external aids, Functional tasks, Multimodal communication approach, SLP instruction and feedback, Compensatory strategies, Patient/family education, [phone] Treatment of speech (30 or 45 min) , and [id number] Treatment of swallowing function    Harlene LITTIE Ned, CCC-SLP 11/12/2023, 3:30 PM

## 2023-11-13 NOTE — Telephone Encounter (Signed)
 Called patient left message

## 2023-11-17 ENCOUNTER — Ambulatory Visit (INDEPENDENT_AMBULATORY_CARE_PROVIDER_SITE_OTHER): Admitting: Psychology

## 2023-11-17 ENCOUNTER — Ambulatory Visit: Admitting: Speech Pathology

## 2023-11-17 DIAGNOSIS — R1312 Dysphagia, oropharyngeal phase: Secondary | ICD-10-CM | POA: Diagnosis not present

## 2023-11-17 DIAGNOSIS — F321 Major depressive disorder, single episode, moderate: Secondary | ICD-10-CM

## 2023-11-17 DIAGNOSIS — R471 Dysarthria and anarthria: Secondary | ICD-10-CM

## 2023-11-17 DIAGNOSIS — F329 Major depressive disorder, single episode, unspecified: Secondary | ICD-10-CM

## 2023-11-17 NOTE — Therapy (Unsigned)
 OUTPATIENT SPEECH LANGUAGE PATHOLOGY TREATMENT NOTE   Patient Name: Christopher Yu MRN: [id number] DOB:[birth year], 63 y.o., male Today's Date: 11/17/2023  PCP: Dwight Trula SQUIBB, MD REFERRING PROVIDER: Emeline Joesph BROCKS, DO  END OF SESSION:  End of Session - 11/17/23 1535     Visit Number 5    Number of Visits 17    Date for SLP Re-Evaluation 11/30/23   extened due to scheduling   Authorization Type [employer]    SLP Start Time 1535    SLP Stop Time  1615    SLP Time Calculation (min) 40 min    Activity Tolerance Patient tolerated treatment well          Past Medical History:  Diagnosis Date   Anxiety    Arthritis, rheumatoid (HCC)    dx 1988   CAD (coronary artery disease), native coronary artery    Mild LAD disease with calcification noted at cath 12//27/16    Cardiomyopathy (HCC)    Cardiomyopathy (HCC)    Chronic systolic heart failure (HCC) 05/01/2015   Depression    Deviated nasal septum 06/25/2011   Headache    High cholesterol    Hyperlipidemia    Impingement syndrome of left shoulder 12/22/2017   Macular degeneration    Nausea and vomiting 08/28/2022   Rheumatoid aortitis    Sleep apnea    does not use cpap - UNABLE TO TOLERATE MASK   Sleep apnea in adult    deviated septum repaired, most recent sleep study was negative   Status post total right knee replacement 06/01/2015   Past Surgical History:  Procedure Laterality Date   ANKLE FUSION Right 05/05/2012   related to his arthritis   ANKLE FUSION Left 05/25/2013   related to his arthritis   ANKLE SURGERY Bilateral    APPLICATION OF CRANIAL NAVIGATION N/A 04/11/2022   Procedure: APPLICATION OF CRANIAL NAVIGATION;  Surgeon: Lanis Pupa, MD;  Location: [facility];  Service: Neurosurgery;  Laterality: N/A;   APPLICATION OF CRANIAL NAVIGATION N/A 08/08/2022   Procedure: APPLICATION OF CRANIAL NAVIGATION;  Surgeon: Lanis Pupa, MD;  Location: [facility];  Service: Neurosurgery;  Laterality: N/A;    BIOPSY  06/09/2023   Procedure: BIOPSY;  Surgeon: Saintclair Jasper, MD;  Location: [facility];  Service: Gastroenterology;;   CARDIAC CATHETERIZATION N/A 05/01/2015   Procedure: Left Heart Cath and Coronary Angiography;  Surgeon: Victory LELON Sharps, MD;  Location: [facility] INVASIVE CV LAB;  Service: Cardiovascular;  Laterality: N/A;   COLONOSCOPY WITH PROPOFOL  N/A 06/09/2023   Procedure: COLONOSCOPY WITH PROPOFOL ;  Surgeon: Saintclair Jasper, MD;  Location: [facility];  Service: Gastroenterology;  Laterality: N/A;   CRANIOTOMY N/A 04/11/2022   Procedure: STEREOTACTIC SUBOCCIPITAL CRANIOTOMY FOR RESECTION OF ARTERIO-VENOUS MALFORMATION;  Surgeon: Lanis Pupa, MD;  Location: [facility];  Service: Neurosurgery;  Laterality: N/A;   ESOPHAGOGASTRODUODENOSCOPY (EGD) WITH PROPOFOL  N/A 06/09/2023   Procedure: ESOPHAGOGASTRODUODENOSCOPY (EGD) WITH PROPOFOL ;  Surgeon: Saintclair Jasper, MD;  Location: [facility];  Service: Gastroenterology;  Laterality: N/A;   FRACTURE SURGERY     left femur fracture x 3   HEMOSTASIS CLIP PLACEMENT  06/09/2023   Procedure: HEMOSTASIS CLIP PLACEMENT;  Surgeon: Saintclair Jasper, MD;  Location: [facility];  Service: Gastroenterology;;   IR ANGIO EXTERNAL CAROTID SEL EXT CAROTID BILAT MOD SED  01/28/2022   IR ANGIO INTRA EXTRACRAN SEL INTERNAL CAROTID BILAT MOD SED  01/28/2022   IR ANGIO INTRA EXTRACRAN SEL INTERNAL CAROTID BILAT MOD SED  04/16/2022   IR ANGIO VERTEBRAL SEL VERTEBRAL  BILAT MOD SED  01/28/2022   IR ANGIO VERTEBRAL SEL VERTEBRAL BILAT MOD SED  04/16/2022   KNEE ARTHROSCOPY     right x 4   LAPAROSCOPIC REVISION VENTRICULAR-PERITONEAL (V-P) SHUNT N/A 08/08/2022   Procedure: LAPAROSCOPIC VENTRICULAR-PERITONEAL (V-P) SHUNT;  Surgeon: Vanderbilt Ned, MD;  Location: [facility];  Service: General;  Laterality: N/A;   LUMBAR LAMINECTOMY/DECOMPRESSION MICRODISCECTOMY N/A 07/18/2022   Procedure: Repair of Pseudomeningiocele posterior;  Surgeon: Lanis Pupa, MD;  Location: [facility];  Service:  Neurosurgery;  Laterality: N/A;   NASAL SEPTOPLASTY W/ TURBINOPLASTY  06/25/2011   Procedure: NASAL SEPTOPLASTY WITH TURBINATE REDUCTION;  Surgeon: Alm Bouche, MD;  Location: [facility] OR;  Service: ENT;  Laterality: Bilateral;   PLACEMENT OF LUMBAR DRAIN N/A 07/18/2022   Procedure: PLACEMENT OF LUMBAR DRAIN;  Surgeon: Lanis Pupa, MD;  Location: [facility];  Service: Neurosurgery;  Laterality: N/A;   POLYPECTOMY  06/09/2023   Procedure: POLYPECTOMY;  Surgeon: Saintclair Jasper, MD;  Location: [facility];  Service: Gastroenterology;;   TONSILLECTOMY     TOTAL KNEE ARTHROPLASTY Right 06/01/2015   Procedure: RIGHT TOTAL KNEE ARTHROPLASTY;  Surgeon: Lonni CINDERELLA Poli, MD;  Location: [facility];  Service: Orthopedics;  Laterality: Right;  Block+general   VENTRICULOPERITONEAL SHUNT Right 08/08/2022   Procedure: LAP ASSITED SHUNT INSERTION VENTRICULAR-PERITONEAL;  Surgeon: Lanis Pupa, MD;  Location: [facility];  Service: Neurosurgery;  Laterality: Right;   Patient Active Problem List   Diagnosis Date Noted   Dysphagia 05/04/2023   At risk for central sleep apnea 03/05/2023   Chronic nonintractable headache 02/02/2023   Insomnia 10/20/2022   Anxiety and depression 10/20/2022   Nausea and vomiting 08/28/2022   Adjustment disorder 08/06/2022   Protein-calorie malnutrition, severe 07/31/2022   Cerebellar stroke (HCC) 07/29/2022   Ischemic cerebrovascular accident (CVA) (HCC) 07/28/2022   Prolonged QT interval 07/28/2022   Pseudomeningocele 07/18/2022   Pseudomeningocele due to surgical procedure 07/18/2022   Status post craniotomy 04/11/2022   AVM (arteriovenous malformation) brain 04/11/2022   Impingement syndrome of left shoulder 12/22/2017   Impingement syndrome of right shoulder 12/22/2017   Rheumatoid arthritis involving right knee (HCC) 06/01/2015   Status post total right knee replacement 06/01/2015   Cardiomyopathy (HCC) 05/02/2015   Hyperlipidemia    Sleep apnea in adult    Rheumatoid  arthritis (HCC)    Chronic systolic heart failure (HCC) 05/01/2015   CAD (coronary artery disease), native coronary artery     ONSET DATE: 08/13/2023 (referral date); CVA 07/28/22  REFERRING DIAG: R13.10 (ICD-10-CM) - Dysphagia, unspecified type I63.9 (ICD-10-CM) - Cerebellar stroke (HCC)  THERAPY DIAG:  Dysphagia, oropharyngeal phase  Dysarthria and anarthria  Rationale for Evaluation and Treatment: Rehabilitation  SUBJECTIVE:   SUBJECTIVE STATEMENT: pt appears in pleasant mood.   Pt accompanied by: self  PERTINENT HISTORY: Mr. Pipkins is seen today referred for  MBSS and outpatient ST as he and his wife both reported increased coughing with solids and thin liquids, which prompted scheduling MBSS. Pt has a hx of dysarthria and dysphagia following stroke in March 2024. Prior MBSS completed 08/18/22 with recommendation of a regular diet, nectar-thick liquids, and L head turn strategy. He completed outpatient SLP tx in June 2024 and self-advanced diet to regular solids and thin liquids and he had elected to defer head turn left with swallow, therefore a follow up MBSS was forgone. Per Mr. Livesey and wife, he has not had any concerns for pneumonia or pulmonary infection since advancing to thin liquids, despite coughing occurences.  PAIN:  Are  you having pain? No  FALLS: Has patient fallen in last 6 months?  No  LIVING ENVIRONMENT: Lives with: lives with their family Lives in: House/apartment  PLOF:  Level of assistance: Independent with ADLs, Needed assistance with IADLS Employment: On disability  PATIENT GOALS: To improve my ability to communicate  OBJECTIVE:  Note: Objective measures were completed at Evaluation unless otherwise noted. OBJECTIVE:   INSTRUMENTAL SWALLOW STUDY FINDINGS (MBSS)  Objective swallow impairments: There was frank aspiration with delayed cough response of thin liquids by cup with head in neutral position.  -No penetration or aspiration of  pudding or cracker trial. -L head turn significantly aided airway protection with thin liquids. While it did not completely eliminate aspiration risk, the amount of penetration/aspiration was significantly reduced. Continued to observe deep, stagnant penetration of thin liquids (in isolation) with use of L head turn.  -L Head turn plus Chin tuck resulted in trace silent aspiration of thin liquids. Chin tuck not recommended.  Objective recommended compensations:    -Regular diet  -Thin liquids with L Head Turn -PO meds whole in pudding or applesauce -Consider nectar-thick liquids if Mr. Boschert has difficulty remembering L head turn strategy or if coughing with thin liquids becomes bothersome.  -Follow up with OP SLP    COGNITION: Overall cognitive status: Impaired Areas of impairment:  Attention: Impaired: Alternating, Divided Awareness: Impaired: Anticipatory Functional deficits:   SUBJECTIVE DYSPHAGIA REPORTS:  Date of onset: 07/28/22 Reported symptoms: coughing with liquids and choking with liquids  Current diet: regular and thin liquids  Co-morbid voice changes: No  FACTORS WHICH MAY INCREASE RISK OF ADVERSE EVENT IN PRESENCE OF ASPIRATION:  General health: poor general health  Risk factors: none evident     ORAL MOTOR EXAMINATION: Overall status: Impaired:   Labial: Bilateral (Coordination) Lingual: Bilateral (Coordination) Velum: Coordination Comments:   CLINICAL SWALLOW ASSESSMENT:   Dentition: adequate natural dentition Vocal quality at baseline: normal Patient directly observed with POs: Yes: thin liquids  Feeding: able to feed self Liquids provided by: coke bottle [facility] Swallow Protocol: n/a Oral phase signs and symptoms: prolonged bolus formation Pharyngeal phase signs and symptoms: immediate cough                                                                                                                          TREATMENT DATE:  11/17/23: Pt with mod-I  completion of effortful swallows, completing x30 in sets of 10. Completed x10 half swallows with min-A verbal coaching. Ongoing encouragement needed for participation. Pt continues to endorse HEP but is not tracking and gives limited details.   (Speech): Pt with teach back of speech strategies and reports successful use in x2 communication opportunities outside of tx. Target use of clear speech in reading with pt self-ID words (primarily multisyllabic words) which are challenging. Use of syllabification and subsequent production results in improved production in 100% of opportunities.   11/11/23: (swallow) Pt completed 25 repetitions of EMST, demonstrating good effort and accurate  exercise completion. Pt completed x30 effortful swallow with thin liquid boluses every second swallow.    (Speech): pt spoke to x3 secretaries via the phone this past week, used clear speech and felt he was understood.  Addressed use of dysarthria strategies in conversation with pt evidencing good carryover of targeted skilled, no requests for repetition needed.   11/10/23: (swallow) SLP led pt through dysphagia exercises targeting pharyngeal strengthening and coordination of swallow. Using thin liquid bolus every third swallow, pt completes x30 effortful swallow with rare overt s/sx aspiration (strong cough). SLP conducted assessment of MEP for EMST. Pt's maximum expiratory pressure today was 120 cm H2O. EMST 150 device was started at 75% MEP at 90 cm H2O, however pt unable to complete with good form. Setting modified to 75 cm H2O. SLP provided demonstration and verbal instruction to utilize EMST device. Pt completed 5 repetitions over 5 sets during our session. HEP initiated for 5 sets of 5 reps for 5 days/week. (Speech) introduced clear speech and generated x10 functional phrases. Pt completed with rare min-A for use of clear speech and intent for each word to enhance clarity. He did each phrase x4 times. Provided paragraph reading  with decreased carryover of clear speech, benefits from SLP feedback. Will continue to address.    11/05/23: Swallow: SLP reviewed all dysphagia exercises and led pt through iteration of the same to address physiologic impairments. Pt completed x20 effortful swallow with intermittent coughing exhibited given thin liquid bolus; x1 successful Masako, multiple unsuccessful attempts; x6 Mendelsohns with 3 second hold; x5 superior lingual press with 3 second hold; :60 CTAR with fist using active resistance.   Speech: reviewed rec to get attention prior to speaking to ensure listener is paying attention. Introduced clear speech, as pt report hard time carrying over dysarthria strategies. Will plan to utilize in reading and phrase generation next session.   09/07/23: Eval completed -   Swallow:Initiated training in HEP for dysphagia - pt required frequent mod A for effortful swallow, masako, CTAR - unable to perform Mendelson with max A. Millard states I can't swallow 5x in a row. Reviewed swallowing precautions - with verbal cues Burt demonstrated head turn left 5/5 sips and verbalized effortful swallow. Effortful swallow employed to support mindfulness and intent when swallowing. He states he forgets to turn his head left - we generated strategies of placing signs to remind him, placing TV to the left when he eats (although I recommend eliminating distractions with meals)and eating at a table. He reports biting his cheeks and tongue during meals. Instructed in using strategy of chewing and swallowing with intent to improve awareness. Education provided on using head turn and swallowing with intent with mixed consistencies as he does eat cereal and soup regularly.  Speech: initiated training in strategies to support intelligibility and expression including saying Liz's name and making sure he gets her attention before he starts speaking, as well as saying 1`-2 key words rather than pointing.    PATIENT  EDUCATION: Education details: See Treatment above; HEP for dysphagia; swallow precautions; diet modifications; communication strategies Person educated: Patient and Spouse Education method: Explanation, Demonstration, Verbal cues, and Handouts Education comprehension: verbalized understanding, returned demonstration, verbal cues required, and needs further education   ASSESSMENT:  CLINICAL IMPRESSION: Patient is a 63 y.o. male who was seen today for mild to moderate oropharyngeal dysphagia; mild ataxic dysarthria; mild cognitive communication impairment. He is well known to us  from prior course of outpatient ST May and June of 2024. He  has not carried over recommendation for head turn left and reports increased coughing with meals. [name] worries about him choking when she is not home. He is eating only soft foods when she is at work. Harjit reports he is having difficulty communicating quickly and [name] endorses he is not talking as much, but pointing, even to get her attention. She states that he just starts talking and she doesn't know who he is talking to and she has to request information be repeated. Overall poor anticipatory awareness in lack of carrying over head turn left, not getting attention before speaking, biting his cheeks and tongue. I recommend skilled ST to maximize safety of swallow, intelligibility, verbal expression for safety, independence and to reduce caregiver burden  OBJECTIVE IMPAIRMENTS: include executive functioning, expressive language, dysarthria, and dysphagia. These impairments are limiting patient from return to work, managing medications, managing finances, ADLs/IADLs, effectively communicating at home and in community, and safety when swallowing. Factors affecting potential to achieve goals and functional outcome are cooperation/participation level. Patient will benefit from skilled SLP services to address above impairments and improve overall function.  REHAB POTENTIAL:  Fair due to time post onset, cognitive impairments   GOALS: Goals reviewed with patient? Yes  SHORT TERM GOALS: Target date: 11/12/23 (extended due to scheduling)  Pt will complete HEP for dysphagia with rare min A  Baseline: Goal status: MET  2.  Pt will follow swallow precautions and diet modifications with rare min A Baseline:  Goal status: INITIAL  3.  Pt will verbally get person's attention and eye contact prior to speaking with rare min A Baseline:  Goal status: PARTIALLY MET  4.  Pt will report 50% reduced biting of his cheek and tongue while eating by chewing and swallowing with intent  Baseline:  Goal status: MET    LONG TERM GOALS: Target date: 12/02/23  Pt will complete HEP at least 1x a day for 1 week Baseline:  Goal status: INITIAL  2.  Pt will carryover swallow precautions including eating with intent and head turn left with mod I Baseline:  Goal status: INITIAL  3.  Spouse will report 50% need to request repetition or clarification when Rocco is talking Baseline:  Goal status: INITIAL  4.  Pt and spouse will carryover 2 compensatory strategies including environmental modifications to support processing and understanding of conversations Baseline:  Goal status: INITIAL   PLAN:  SLP FREQUENCY: 2x/week  SLP DURATION: 6 weeks - POC dates extended due to scheduling  PLANNED INTERVENTIONS: Aspiration precaution training, Pharyngeal strengthening exercises, Diet toleration management , Language facilitation, Environmental controls, Trials of upgraded texture/liquids, Cueing hierachy, Cognitive reorganization, Internal/external aids, Functional tasks, Multimodal communication approach, SLP instruction and feedback, Compensatory strategies, Patient/family education, [phone] Treatment of speech (30 or 45 min) , and [id number] Treatment of swallowing function    Harlene LITTIE Ned, CCC-SLP 11/17/2023, 3:35 PM

## 2023-11-17 NOTE — Progress Notes (Signed)
 [facility] Counselor Initial Adult Exam  Name: Christopher Yu Date: 11/17/2023 MRN: [id number] DOB: 1960/07/30 PCP: Dwight Trula SQUIBB, MD    Guardian/Payee:  N/A    Paperwork requested: No   Reason for Visit [name] Problem: Adjustment to serious medical illness  Mental Status Exam: Appearance:   Casual     Behavior:  Appropriate  Motor:  Shuffling Gait  Speech/Language:   Slurred  Affect:  Depressed  Mood:  depressed  Thought process:  normal  Thought content:    WNL  Sensory/Perceptual disturbances:    WNL  Orientation:  oriented to person, place, and situation  Attention:  Good  Concentration:  Good  Memory:  WNL  Fund of knowledge:   Good  Insight:    Good  Judgment:   unknown  Impulse Control:  unknown    Reported Symptoms:  Sadness, helplessness and hopelessness  Risk Assessment: Danger to Self:  No Self-injurious Behavior: No Danger to Others: No Duty to Warn:no Physical Aggression / Violence:No  Access to Firearms a concern: Unknown Gang Involvement:No  Patient / guardian was educated about steps to take if suicide or homicide risk level increases between visits: n/a While future psychiatric events cannot be accurately predicted, the patient does not currently require acute inpatient psychiatric care and does not currently meet [facility]  involuntary commitment criteria.  Substance Abuse History: Current substance abuse: No     Past Psychiatric History:   No previous psychological problems have been observed Outpatient Providers:N/A History of Psych Hospitalization:  No  Psychological Testing: none   Abuse History:  Victim of:  N/A  Report needed: No. Victim of Neglect:No. Perpetrator of N/A  Witness / Exposure to Domestic Violence: No   Protective Services Involvement: No  Witness to [employer] Violence:  No   Family History:  Family History  Problem Relation Age of Onset   Other Father        MVA   Lupus Sister     Living situation: the patient lives with their family  Sexual Orientation: Straight  Relationship Status: married  Name of spouse Christopher Yu If a parent, number of children / ages:Two girls, ages 22 and 16  Support Systems: spouse  Surveyor, quantity Stress:  Yes   Income/Employment/Disability: Software engineer: unknown  Educational History: Education: unknown  Religion/Sprituality/World View: unknown  Any cultural differences that may affect / interfere with treatment:  not applicable  Recreation/Hobbies: golfing  Stressors: Financial difficulties   Health problems    Strengths: Family  Barriers:  severe physical limitations that compromises activity   Legal History: Pending legal issue / charges: The patient has no significant history of legal issues. History of legal issue / charges: N/A  Medical History/Surgical History: reviewed Past Medical History:  Diagnosis Date   Anxiety    Arthritis, rheumatoid (HCC)    dx 1988   CAD (coronary artery disease), native coronary artery    Mild LAD disease with calcification noted at cath 12//27/16    Cardiomyopathy (HCC)    Cardiomyopathy (HCC)    Chronic systolic heart failure (HCC) 05/01/2015   Depression    Deviated nasal septum 06/25/2011   Headache    High cholesterol    Hyperlipidemia    Impingement syndrome of left shoulder 12/22/2017   Macular degeneration    Nausea and vomiting 08/28/2022   Rheumatoid aortitis    Sleep apnea    does not use cpap - UNABLE TO TOLERATE MASK   Sleep apnea in adult    deviated septum repaired, most  recent sleep study was negative   Status post total right knee replacement 06/01/2015    Past Surgical History:  Procedure Laterality Date   ANKLE FUSION Right 05/05/2012   related to his arthritis   ANKLE FUSION Left 05/25/2013   related to his arthritis   ANKLE SURGERY Bilateral    APPLICATION OF CRANIAL NAVIGATION N/A 04/11/2022   Procedure: APPLICATION OF CRANIAL NAVIGATION;  Surgeon: Lanis Pupa, MD;  Location: [facility];  Service: Neurosurgery;  Laterality: N/A;   APPLICATION OF CRANIAL NAVIGATION N/A 08/08/2022   Procedure: APPLICATION OF CRANIAL NAVIGATION;  Surgeon: Lanis Pupa, MD;  Location: [facility] OR;  Service: Neurosurgery;  Laterality: N/A;   BIOPSY  06/09/2023   Procedure: BIOPSY;  Surgeon: Saintclair Jasper, MD;  Location: [facility];  Service: Gastroenterology;;   CARDIAC CATHETERIZATION N/A 05/01/2015   Procedure: Left Heart Cath and Coronary Angiography;  Surgeon: Victory LELON Sharps, MD;  Location: [facility];  Service: Cardiovascular;  Laterality: N/A;   COLONOSCOPY WITH PROPOFOL  N/A 06/09/2023   Procedure: COLONOSCOPY WITH PROPOFOL ;  Surgeon: Saintclair Jasper, MD;  Location: [facility];  Service: Gastroenterology;  Laterality: N/A;   CRANIOTOMY N/A 04/11/2022   Procedure: STEREOTACTIC SUBOCCIPITAL CRANIOTOMY FOR RESECTION OF ARTERIO-VENOUS MALFORMATION;  Surgeon: Lanis Pupa, MD;  Location: [facility];  Service: Neurosurgery;  Laterality: N/A;   ESOPHAGOGASTRODUODENOSCOPY (EGD) WITH PROPOFOL  N/A 06/09/2023   Procedure: ESOPHAGOGASTRODUODENOSCOPY (EGD) WITH PROPOFOL ;  Surgeon: Saintclair Jasper, MD;  Location: [facility];  Service: Gastroenterology;  Laterality: N/A;   FRACTURE SURGERY     left femur fracture x 3   HEMOSTASIS CLIP PLACEMENT  06/09/2023   Procedure: HEMOSTASIS CLIP PLACEMENT;  Surgeon: Saintclair Jasper, MD;  Location: [facility];  Service: Gastroenterology;;   IR ANGIO EXTERNAL CAROTID SEL EXT CAROTID BILAT MOD SED  01/28/2022   IR ANGIO INTRA EXTRACRAN SEL  INTERNAL CAROTID BILAT MOD SED  01/28/2022   IR ANGIO INTRA EXTRACRAN SEL INTERNAL CAROTID BILAT MOD SED  04/16/2022   IR ANGIO VERTEBRAL SEL VERTEBRAL BILAT MOD SED  01/28/2022   IR ANGIO VERTEBRAL SEL VERTEBRAL BILAT MOD SED  04/16/2022   KNEE ARTHROSCOPY     right x 4   LAPAROSCOPIC REVISION VENTRICULAR-PERITONEAL (V-P) SHUNT N/A 08/08/2022   Procedure: LAPAROSCOPIC VENTRICULAR-PERITONEAL (V-P) SHUNT;  Surgeon: Vanderbilt Ned, MD;  Location: [facility];  Service: General;  Laterality: N/A;   LUMBAR LAMINECTOMY/DECOMPRESSION MICRODISCECTOMY  N/A 07/18/2022   Procedure: Repair of Pseudomeningiocele posterior;  Surgeon: Lanis Pupa, MD;  Location: [facility] OR;  Service: Neurosurgery;  Laterality: N/A;   NASAL SEPTOPLASTY W/ TURBINOPLASTY  06/25/2011   Procedure: NASAL SEPTOPLASTY WITH TURBINATE REDUCTION;  Surgeon: Alm Bouche, MD;  Location: [facility] OR;  Service: ENT;  Laterality: Bilateral;   PLACEMENT OF LUMBAR DRAIN N/A 07/18/2022   Procedure: PLACEMENT OF LUMBAR DRAIN;  Surgeon: Lanis Pupa, MD;  Location: [facility];  Service: Neurosurgery;  Laterality: N/A;   POLYPECTOMY  06/09/2023   Procedure: POLYPECTOMY;  Surgeon: Saintclair Jasper, MD;  Location: [facility];  Service: Gastroenterology;;   TONSILLECTOMY     TOTAL KNEE ARTHROPLASTY Right 06/01/2015   Procedure: RIGHT TOTAL KNEE ARTHROPLASTY;  Surgeon: Lonni CINDERELLA Poli, MD;  Location: [facility];  Service: Orthopedics;  Laterality: Right;  Block+general   VENTRICULOPERITONEAL SHUNT Right 08/08/2022   Procedure: LAP ASSITED SHUNT INSERTION VENTRICULAR-PERITONEAL;  Surgeon: Lanis Pupa, MD;  Location: [facility];  Service: Neurosurgery;  Laterality: Right;    Medications: Current Outpatient Medications  Medication Sig Dispense Refill   acetaminophen  (TYLENOL ) 325 MG tablet Take 2 tablets (650 mg total) by mouth 3 (three) times daily.     alendronate (FOSAMAX) 10 MG tablet Take 10 mg by mouth daily before breakfast. Take with a full glass of  water  on an empty stomach.     artificial tears (LACRILUBE) OINT ophthalmic ointment Place into both eyes 2 (two) times daily.     aspirin  EC 81 MG tablet Take 1 tablet (81 mg total) by mouth daily. Swallow whole. 30 tablet 12   calcium  carbonate (TUMS - DOSED IN MG ELEMENTAL CALCIUM ) 500 MG chewable tablet Chew 1 tablet (200 mg of elemental calcium  total) by mouth daily with supper.     Cholecalciferol (VITAMIN D3) 50 MCG (2000 UT) TABS Take 2,000 Units by mouth daily.     famotidine (PEPCID) 40 MG tablet 1 tablet Orally Once a day for 30 days     folic acid  (FOLVITE ) 1 MG tablet Take 1 mg by mouth daily.     methotrexate  (RHEUMATREX) 2.5 MG tablet Take 20 mg by mouth once a week. Takes 8 tablets(20mg ) every Saturday.     Caution:Chemotherapy. Protect from light.     metoprolol  succinate (TOPROL -XL) 25 MG 24 hr tablet TAKE 1 TABLET DAILY 90 tablet 3   Multiple Vitamins-Minerals (PRESERVISION AREDS 2 PO) Take 2 capsules by mouth daily.     predniSONE  (STERAPRED UNI-PAK 21 TAB) 5 MG (21) TBPK tablet Take 5 mg by mouth every morning.     RITUXAN  500 MG/50ML injection See admin instructions. Every 6 months 2 dose infusion Ruxience      rosuvastatin  (CRESTOR ) 20 MG tablet Take 1 tablet (20 mg total) by mouth daily. Patient must schedule annual appointment for further refills first attempt 30 tablet 0   sertraline  (ZOLOFT ) 20 MG/ML concentrated solution Take 25 mg by mouth daily.     spironolactone  (ALDACTONE ) 25 MG tablet 12.5 mg. 1/2 tab daily     sulfaSALAzine  (AZULFIDINE ) 500 MG tablet Take 1,000 mg by mouth 2 (two) times daily.     traMADol  (ULTRAM ) 50 MG tablet Take 1 tablet (50 mg total) by mouth every 12 (twelve) hours as needed for moderate pain. (Patient taking differently: Take 50 mg by mouth as needed for moderate pain (pain score 4-6).) 60 tablet 2   traZODone  (DESYREL ) 50 MG tablet TAKE 1.5 TABLETS (75 MG TOTAL) BY MOUTH AT BEDTIME AS NEEDED. FOR SLEEP 135  tablet 5   White  Petrolatum -Mineral Oil (GENTEAL TEARS NIGHT-TIME OP) Apply to eye at bedtime.     No current facility-administered medications for this visit.    Allergies  Allergen Reactions   Hydroxychloroquine      Other Reaction(s): cardiolmyopathy    Diagnoses:  Major depression  Plan of Care: Outpatient psychotherapy and medication management as needed. Initial Note: (With wife Christopher Yu). She says that she has hoped Inman would get into therapy. He has Ataxic Tremor. He told Dr. Evonnie that he would like medication to just sit and do nothing. He says he always had golf to rely on and now cannot play. In December, he went n for a surgery to remove an avm from brain. Was told it was a routine surgery and was told that he needs to get it out. Was told it would be a short recovery and he would be back to work. 1 1/2 hour surgery turned into 6 hour surgery and he had complications. He had extra cerebral fluid and Dr. said it would absorb and just wait. He kept declining and by March, they went in to try and drain the extra fluid. He was in hospital for a week. Wife noticed that during the hospital stay, his speech declined and he was clearly weak. Nurses were trying to discharge him and she protested, but they sent him home anyway. He declined, and could not sit, stand, talk or walk and she ended up calling ambulance. In ER, she ws told he had a stroke. He was in ER 24 hours before getting a room in Neuro area. Head of stroke team came in and said it was not a stroke, but complications from surgery. She was blamed by medical team for not speaking up before being initially discharged. He ended up in a rehab for 2 weeks and told them he needed another surgery. Was told he had hydrocephalus and need a shunt to drain liquid off his brain. She felt a lot of pressure from team that he needed surgery. She wanted a second opinion and had rouble getting it. Spoke to another Dr. and they decided to get the third surgery. He was in  OT,PT and Speech Therapy. During therapy, he started having a tremor. They ended up being referred to Dr. Evonnie, who has now told him that treating his tremor is tricky and will take time.  Jerard is no longer in OT,PT speech therapy. He is supposed to be doing exercises on his own, but has not been emotionally able to ge himself to do it. He has persistent headaches as well. Just had a CAT Scan and awaiting the results. His headaches are worse and they are concerned. He also has rhumatory arthritis and it is flaring up because he had to stop meds. He has cardiomyopathy, macular degeneration on top of all of these other conditions.  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 He has been approved for disability and are working to get social security disability. She works part time. They have 2 daughters, one with diabetes and arthritis. She is heading to [employer] this Fall and wants to work in Research scientist (physical sciences). Other daughter is 64 and is our introvert with lots of anxiety. She is in therapy and has mental health issues that is being treated. She will be a Holiday representative at the middle college at [employer].  They are told that medicine will not control  his condition, but they will have to try to retrain his brain. Suggested that they work to get back into OT and PT. She has a hard time motivating him. He admits that he keeps things inside. She says he is not a good comunicator. He will not share things like if he is in pain or hurts himself.   Tx. Plan/Goals: Patient reports depressive symptoms secondary to the medical trauma he has suffered and the debilitating physical results. He is seeking therapy to resolve his feelings of helplessness and hopelessness. Also want assistance in improving his drive/motivation in daily activity. Will engage in therapy that utilizes behavioral approaches to mitigate symptoms along with insight oriented counseling. Will also  incorporate some conjoint sessions with spouse as needed, given her integral involvement with his care. Patient agrees with plan for treatment. Goal Date is 12-25    Patient agrees to be seen in video (caregility) session and is aware of the limitations of this platform. He is at home and provider in home office. Session Note:  Patient states he is going to a speech therapist and it is going well. He tried to swing a golf club and found that he had balance problems. Has contacted Dr. Evonnie to ask about balance interventions. Has not yet heard back from her. He says he is willing to engage in therapy for improved balance, with hopes of returning to the golf course. This gives him greater drive and motivation.                                                                                                      CONI ALM KERNS, PhD 2:15p-3:00 50 minutes

## 2023-11-19 ENCOUNTER — Ambulatory Visit: Admitting: Speech Pathology

## 2023-11-19 DIAGNOSIS — R1312 Dysphagia, oropharyngeal phase: Secondary | ICD-10-CM | POA: Diagnosis not present

## 2023-11-19 DIAGNOSIS — R471 Dysarthria and anarthria: Secondary | ICD-10-CM

## 2023-11-19 NOTE — Telephone Encounter (Signed)
 I spoke with patients wife and she let me know that patients has a lot of difficulty speaking and prefers mychart and he can take his time to respond. Patient very depressed due to his inability to do sports like golf and wanted to know as well about a PT that is more sports oriented for him. He also responded back with a couple of other questions after I sent him a my chart message when I was unable to reach him via phone

## 2023-11-19 NOTE — Therapy (Unsigned)
 OUTPATIENT SPEECH LANGUAGE PATHOLOGY TREATMENT NOTE   Patient Name: Christopher Yu MRN: [id number] DOB:1961-01-22, 63 y.o., male Today's Date: 11/19/2023  PCP: Dwight Trula SQUIBB, MD REFERRING PROVIDER: Emeline Joesph BROCKS, DO  END OF SESSION:  End of Session - 11/19/23 1540     Visit Number 6    Number of Visits 17    Date for SLP Re-Evaluation 11/30/23   extened due to scheduling   Authorization Type [employer]    SLP Start Time 1530    SLP Stop Time  1615    SLP Time Calculation (min) 45 min    Activity Tolerance Patient tolerated treatment well          Past Medical History:  Diagnosis Date   Anxiety    Arthritis, rheumatoid (HCC)    dx 1988   CAD (coronary artery disease), native coronary artery    Mild LAD disease with calcification noted at cath 12//27/16    Cardiomyopathy (HCC)    Cardiomyopathy (HCC)    Chronic systolic heart failure (HCC) 05/01/2015   Depression    Deviated nasal septum 06/25/2011   Headache    High cholesterol    Hyperlipidemia    Impingement syndrome of left shoulder 12/22/2017   Macular degeneration    Nausea and vomiting 08/28/2022   Rheumatoid aortitis    Sleep apnea    does not use cpap - UNABLE TO TOLERATE MASK   Sleep apnea in adult    deviated septum repaired, most recent sleep study was negative   Status post total right knee replacement 06/01/2015   Past Surgical History:  Procedure Laterality Date   ANKLE FUSION Right 05/05/2012   related to his arthritis   ANKLE FUSION Left 05/25/2013   related to his arthritis   ANKLE SURGERY Bilateral    APPLICATION OF CRANIAL NAVIGATION N/A 04/11/2022   Procedure: APPLICATION OF CRANIAL NAVIGATION;  Surgeon: Lanis Pupa, MD;  Location: [facility];  Service: Neurosurgery;  Laterality: N/A;   APPLICATION OF CRANIAL NAVIGATION N/A 08/08/2022   Procedure: APPLICATION OF CRANIAL NAVIGATION;  Surgeon: Lanis Pupa, MD;  Location: [facility];  Service: Neurosurgery;  Laterality: N/A;    BIOPSY  06/09/2023   Procedure: BIOPSY;  Surgeon: Saintclair Jasper, MD;  Location: [facility];  Service: Gastroenterology;;   CARDIAC CATHETERIZATION N/A 05/01/2015   Procedure: Left Heart Cath and Coronary Angiography;  Surgeon: Victory LELON Sharps, MD;  Location: [facility] INVASIVE CV LAB;  Service: Cardiovascular;  Laterality: N/A;   COLONOSCOPY WITH PROPOFOL  N/A 06/09/2023   Procedure: COLONOSCOPY WITH PROPOFOL ;  Surgeon: Saintclair Jasper, MD;  Location: [facility];  Service: Gastroenterology;  Laterality: N/A;   CRANIOTOMY N/A 04/11/2022   Procedure: STEREOTACTIC SUBOCCIPITAL CRANIOTOMY FOR RESECTION OF ARTERIO-VENOUS MALFORMATION;  Surgeon: Lanis Pupa, MD;  Location: [facility];  Service: Neurosurgery;  Laterality: N/A;   ESOPHAGOGASTRODUODENOSCOPY (EGD) WITH PROPOFOL  N/A 06/09/2023   Procedure: ESOPHAGOGASTRODUODENOSCOPY (EGD) WITH PROPOFOL ;  Surgeon: Saintclair Jasper, MD;  Location: [facility];  Service: Gastroenterology;  Laterality: N/A;   FRACTURE SURGERY     left femur fracture x 3   HEMOSTASIS CLIP PLACEMENT  06/09/2023   Procedure: HEMOSTASIS CLIP PLACEMENT;  Surgeon: Saintclair Jasper, MD;  Location: [facility];  Service: Gastroenterology;;   IR ANGIO EXTERNAL CAROTID SEL EXT CAROTID BILAT MOD SED  01/28/2022   IR ANGIO INTRA EXTRACRAN SEL INTERNAL CAROTID BILAT MOD SED  01/28/2022   IR ANGIO INTRA EXTRACRAN SEL INTERNAL CAROTID BILAT MOD SED  04/16/2022   IR ANGIO VERTEBRAL SEL VERTEBRAL  BILAT MOD SED  01/28/2022   IR ANGIO VERTEBRAL SEL VERTEBRAL BILAT MOD SED  04/16/2022   KNEE ARTHROSCOPY     right x 4   LAPAROSCOPIC REVISION VENTRICULAR-PERITONEAL (V-P) SHUNT N/A 08/08/2022   Procedure: LAPAROSCOPIC VENTRICULAR-PERITONEAL (V-P) SHUNT;  Surgeon: Vanderbilt Ned, MD;  Location: [facility];  Service: General;  Laterality: N/A;   LUMBAR LAMINECTOMY/DECOMPRESSION MICRODISCECTOMY N/A 07/18/2022   Procedure: Repair of Pseudomeningiocele posterior;  Surgeon: Lanis Pupa, MD;  Location: [facility];  Service:  Neurosurgery;  Laterality: N/A;   NASAL SEPTOPLASTY W/ TURBINOPLASTY  06/25/2011   Procedure: NASAL SEPTOPLASTY WITH TURBINATE REDUCTION;  Surgeon: Alm Bouche, MD;  Location: [facility] OR;  Service: ENT;  Laterality: Bilateral;   PLACEMENT OF LUMBAR DRAIN N/A 07/18/2022   Procedure: PLACEMENT OF LUMBAR DRAIN;  Surgeon: Lanis Pupa, MD;  Location: [facility];  Service: Neurosurgery;  Laterality: N/A;   POLYPECTOMY  06/09/2023   Procedure: POLYPECTOMY;  Surgeon: Saintclair Jasper, MD;  Location: [facility];  Service: Gastroenterology;;   TONSILLECTOMY     TOTAL KNEE ARTHROPLASTY Right 06/01/2015   Procedure: RIGHT TOTAL KNEE ARTHROPLASTY;  Surgeon: Lonni CINDERELLA Poli, MD;  Location: [facility];  Service: Orthopedics;  Laterality: Right;  Block+general   VENTRICULOPERITONEAL SHUNT Right 08/08/2022   Procedure: LAP ASSITED SHUNT INSERTION VENTRICULAR-PERITONEAL;  Surgeon: Lanis Pupa, MD;  Location: [facility];  Service: Neurosurgery;  Laterality: Right;   Patient Active Problem List   Diagnosis Date Noted   Dysphagia 05/04/2023   At risk for central sleep apnea 03/05/2023   Chronic nonintractable headache 02/02/2023   Insomnia 10/20/2022   Anxiety and depression 10/20/2022   Nausea and vomiting 08/28/2022   Adjustment disorder 08/06/2022   Protein-calorie malnutrition, severe 07/31/2022   Cerebellar stroke (HCC) 07/29/2022   Ischemic cerebrovascular accident (CVA) (HCC) 07/28/2022   Prolonged QT interval 07/28/2022   Pseudomeningocele 07/18/2022   Pseudomeningocele due to surgical procedure 07/18/2022   Status post craniotomy 04/11/2022   AVM (arteriovenous malformation) brain 04/11/2022   Impingement syndrome of left shoulder 12/22/2017   Impingement syndrome of right shoulder 12/22/2017   Rheumatoid arthritis involving right knee (HCC) 06/01/2015   Status post total right knee replacement 06/01/2015   Cardiomyopathy (HCC) 05/02/2015   Hyperlipidemia    Sleep apnea in adult    Rheumatoid  arthritis (HCC)    Chronic systolic heart failure (HCC) 05/01/2015   CAD (coronary artery disease), native coronary artery     ONSET DATE: 08/13/2023 (referral date); CVA 07/28/22  REFERRING DIAG: R13.10 (ICD-10-CM) - Dysphagia, unspecified type I63.9 (ICD-10-CM) - Cerebellar stroke (HCC)  THERAPY DIAG:  Dysphagia, oropharyngeal phase  Dysarthria and anarthria  Rationale for Evaluation and Treatment: Rehabilitation  SUBJECTIVE:   SUBJECTIVE STATEMENT: pt engaged throughout session. Spouse present for second half.    PERTINENT HISTORY: Mr. Pursley is seen today referred for  MBSS and outpatient ST as he and his wife both reported increased coughing with solids and thin liquids, which prompted scheduling MBSS. Pt has a hx of dysarthria and dysphagia following stroke in March 2024. Prior MBSS completed 08/18/22 with recommendation of a regular diet, nectar-thick liquids, and L head turn strategy. He completed outpatient SLP tx in June 2024 and self-advanced diet to regular solids and thin liquids and he had elected to defer head turn left with swallow, therefore a follow up MBSS was forgone. Per Mr. Holsomback and wife, he has not had any concerns for pneumonia or pulmonary infection since advancing to thin liquids, despite coughing occurences.  PAIN:  Are  you having pain? No  FALLS: Has patient fallen in last 6 months?  No  LIVING ENVIRONMENT: Lives with: lives with their family Lives in: House/apartment  PLOF:  Level of assistance: Independent with ADLs, Needed assistance with IADLS Employment: On disability  PATIENT GOALS: To improve my ability to communicate  OBJECTIVE:  Note: Objective measures were completed at Evaluation unless otherwise noted. OBJECTIVE:   INSTRUMENTAL SWALLOW STUDY FINDINGS (MBSS)  Objective swallow impairments: There was frank aspiration with delayed cough response of thin liquids by cup with head in neutral position.  -No penetration or aspiration  of pudding or cracker trial. -L head turn significantly aided airway protection with thin liquids. While it did not completely eliminate aspiration risk, the amount of penetration/aspiration was significantly reduced. Continued to observe deep, stagnant penetration of thin liquids (in isolation) with use of L head turn.  -L Head turn plus Chin tuck resulted in trace silent aspiration of thin liquids. Chin tuck not recommended.  Objective recommended compensations:    -Regular diet  -Thin liquids with L Head Turn -PO meds whole in pudding or applesauce -Consider nectar-thick liquids if Mr. Schreier has difficulty remembering L head turn strategy or if coughing with thin liquids becomes bothersome.  -Follow up with OP SLP    COGNITION: Overall cognitive status: Impaired Areas of impairment:  Attention: Impaired: Alternating, Divided Awareness: Impaired: Anticipatory Functional deficits:   SUBJECTIVE DYSPHAGIA REPORTS:  Date of onset: 07/28/22 Reported symptoms: coughing with liquids and choking with liquids  Current diet: regular and thin liquids  Co-morbid voice changes: No  FACTORS WHICH MAY INCREASE RISK OF ADVERSE EVENT IN PRESENCE OF ASPIRATION:  General health: poor general health  Risk factors: none evident     ORAL MOTOR EXAMINATION: Overall status: Impaired:   Labial: Bilateral (Coordination) Lingual: Bilateral (Coordination) Velum: Coordination Comments:   CLINICAL SWALLOW ASSESSMENT:   Dentition: adequate natural dentition Vocal quality at baseline: normal Patient directly observed with POs: Yes: thin liquids  Feeding: able to feed self Liquids provided by: coke bottle [facility] Swallow Protocol: n/a Oral phase signs and symptoms: prolonged bolus formation Pharyngeal phase signs and symptoms: immediate cough                                                                                                                          TREATMENT DATE:  11/19/23: (swallow)  Increased EMST to 70 cm H2O, pt completed 5 sets of 5 repetitions for total of 25 reps. SLP modeled pacing for exercise completion to increased accuracy of exercise. Pt rates as 8/10 on perceived effort scale. Pt with teach back of dysphagia HEP, reports compliance with SLP recommendations. Unable to provide details. Demonstrates effortful swallow with mod-I over 10 reps. Challenges with hold for mendelsohn, achieves in 3/8 trials.   (Speech) Pt with clear speech in conversation over 10 minute conversation, x3 requests for repetition needed, typically d/t reduced breath support resulting in end of utterance aphonia for telescoping of multisyllabic words. Target  production of 3-4 syllable words in isolation then generative sentences with pt demonstrating 90% accuracy with rare min-A.  11/17/23: Pt with mod-I completion of effortful swallows, completing x30 in sets of 10. Completed x10 half swallows with min-A verbal coaching. Ongoing encouragement needed for participation. Pt continues to endorse HEP but is not tracking and gives limited details.   (Speech): Pt with teach back of speech strategies and reports successful use in x2 communication opportunities outside of tx. Target use of clear speech in reading with pt self-ID words (primarily multisyllabic words) which are challenging. Use of syllabification and subsequent production results in improved production in 100% of opportunities.   11/11/23: (swallow) Pt completed 25 repetitions of EMST, demonstrating good effort and accurate exercise completion. Pt completed x30 effortful swallow with thin liquid boluses every second swallow.    (Speech): pt spoke to x3 secretaries via the phone this past week, used clear speech and felt he was understood.  Addressed use of dysarthria strategies in conversation with pt evidencing good carryover of targeted skilled, no requests for repetition needed.   11/10/23: (swallow) SLP led pt through dysphagia exercises  targeting pharyngeal strengthening and coordination of swallow. Using thin liquid bolus every third swallow, pt completes x30 effortful swallow with rare overt s/sx aspiration (strong cough). SLP conducted assessment of MEP for EMST. Pt's maximum expiratory pressure today was 120 cm H2O. EMST 150 device was started at 75% MEP at 90 cm H2O, however pt unable to complete with good form. Setting modified to 75 cm H2O. SLP provided demonstration and verbal instruction to utilize EMST device. Pt completed 5 repetitions over 5 sets during our session. HEP initiated for 5 sets of 5 reps for 5 days/week. (Speech) introduced clear speech and generated x10 functional phrases. Pt completed with rare min-A for use of clear speech and intent for each word to enhance clarity. He did each phrase x4 times. Provided paragraph reading with decreased carryover of clear speech, benefits from SLP feedback. Will continue to address.    11/05/23: Swallow: SLP reviewed all dysphagia exercises and led pt through iteration of the same to address physiologic impairments. Pt completed x20 effortful swallow with intermittent coughing exhibited given thin liquid bolus; x1 successful Masako, multiple unsuccessful attempts; x6 Mendelsohns with 3 second hold; x5 superior lingual press with 3 second hold; :60 CTAR with fist using active resistance.   Speech: reviewed rec to get attention prior to speaking to ensure listener is paying attention. Introduced clear speech, as pt report hard time carrying over dysarthria strategies. Will plan to utilize in reading and phrase generation next session.   09/07/23: Eval completed -   Swallow:Initiated training in HEP for dysphagia - pt required frequent mod A for effortful swallow, masako, CTAR - unable to perform Mendelson with max A. Millard states I can't swallow 5x in a row. Reviewed swallowing precautions - with verbal cues Chucky demonstrated head turn left 5/5 sips and verbalized effortful  swallow. Effortful swallow employed to support mindfulness and intent when swallowing. He states he forgets to turn his head left - we generated strategies of placing signs to remind him, placing TV to the left when he eats (although I recommend eliminating distractions with meals)and eating at a table. He reports biting his cheeks and tongue during meals. Instructed in using strategy of chewing and swallowing with intent to improve awareness. Education provided on using head turn and swallowing with intent with mixed consistencies as he does eat cereal and soup regularly.  Speech:  initiated training in strategies to support intelligibility and expression including saying Liz's name and making sure he gets her attention before he starts speaking, as well as saying 1`-2 key words rather than pointing.    PATIENT EDUCATION: Education details: See Treatment above; HEP for dysphagia; swallow precautions; diet modifications; communication strategies Person educated: Patient and Spouse Education method: Explanation, Demonstration, Verbal cues, and Handouts Education comprehension: verbalized understanding, returned demonstration, verbal cues required, and needs further education   ASSESSMENT:  CLINICAL IMPRESSION: Patient is a 63 y.o. male who was seen today for mild to moderate oropharyngeal dysphagia; mild ataxic dysarthria; mild cognitive communication impairment. He is well known to us  from prior course of outpatient ST May and June of 2024. He has not carried over recommendation for head turn left and reports increased coughing with meals. [name] worries about him choking when she is not home. He is eating only soft foods when she is at work. Torrell reports he is having difficulty communicating quickly and [name] endorses he is not talking as much, but pointing, even to get her attention. She states that he just starts talking and she doesn't know who he is talking to and she has to request information be  repeated. Overall poor anticipatory awareness in lack of carrying over head turn left, not getting attention before speaking, biting his cheeks and tongue. Skilled ST thus far focused on dysphagia exercise program and safety training, dysarthria strategies and compensations. Use of clear speech as rehabilitative technique to increase awareness and communication clarity. I recommend ongoing ST.   OBJECTIVE IMPAIRMENTS: include executive functioning, expressive language, dysarthria, and dysphagia. These impairments are limiting patient from return to work, managing medications, managing finances, ADLs/IADLs, effectively communicating at home and in community, and safety when swallowing. Factors affecting potential to achieve goals and functional outcome are cooperation/participation level. Patient will benefit from skilled SLP services to address above impairments and improve overall function.  REHAB POTENTIAL: Fair due to time post onset, cognitive impairments   GOALS: Goals reviewed with patient? Yes  SHORT TERM GOALS: Target date: 11/12/23 (extended due to scheduling)  Pt will complete HEP for dysphagia with rare min A  Baseline: Goal status: MET  2.  Pt will follow swallow precautions and diet modifications with rare min A Baseline:  Goal status: INITIAL  3.  Pt will verbally get person's attention and eye contact prior to speaking with rare min A Baseline:  Goal status: PARTIALLY MET  4.  Pt will report 50% reduced biting of his cheek and tongue while eating by chewing and swallowing with intent  Baseline:  Goal status: MET  LONG TERM GOALS: Target date: 01/15/2024 (for recertification)   Pt will complete HEP at least 1x a day for 1 week Baseline:  Goal status: INITIAL  2.  Pt will carryover swallow precautions including eating with intent and head turn left with mod I Baseline:  Goal status: INITIAL  3.  Spouse will report 50% need to request repetition or clarification  when Korbin is talking Baseline:  Goal status: INITIAL  4.  Pt and spouse will carryover 2 compensatory strategies including environmental modifications to support processing and understanding of conversations Baseline:  Goal status: INITIAL   PLAN:  SLP FREQUENCY: 2x/week  SLP DURATION: 6 weeks - POC dates extended due to scheduling  PLANNED INTERVENTIONS: Aspiration precaution training, Pharyngeal strengthening exercises, Diet toleration management , Language facilitation, Environmental controls, Trials of upgraded texture/liquids, Cueing hierachy, Cognitive reorganization, Internal/external aids, Functional tasks, Multimodal communication  approach, SLP instruction and feedback, Compensatory strategies, Patient/family education, [phone] Treatment of speech (30 or 45 min) , and [id number] Treatment of swallowing function    Harlene LITTIE Ned, CCC-SLP 11/19/2023, 3:40 PM

## 2023-11-20 DIAGNOSIS — M810 Age-related osteoporosis without current pathological fracture: Secondary | ICD-10-CM | POA: Diagnosis not present

## 2023-11-20 DIAGNOSIS — F339 Major depressive disorder, recurrent, unspecified: Secondary | ICD-10-CM | POA: Diagnosis not present

## 2023-11-20 DIAGNOSIS — Z8673 Personal history of transient ischemic attack (TIA), and cerebral infarction without residual deficits: Secondary | ICD-10-CM | POA: Diagnosis not present

## 2023-11-20 DIAGNOSIS — Z125 Encounter for screening for malignant neoplasm of prostate: Secondary | ICD-10-CM | POA: Diagnosis not present

## 2023-11-20 DIAGNOSIS — G8929 Other chronic pain: Secondary | ICD-10-CM | POA: Diagnosis not present

## 2023-11-20 DIAGNOSIS — M069 Rheumatoid arthritis, unspecified: Secondary | ICD-10-CM | POA: Diagnosis not present

## 2023-11-20 NOTE — Addendum Note (Signed)
 Addended by: DEBBY HARLENE CROME on: 11/20/2023 07:58 AM   Modules accepted: Orders

## 2023-11-24 ENCOUNTER — Ambulatory Visit: Admitting: Speech Pathology

## 2023-11-25 ENCOUNTER — Ambulatory Visit (INDEPENDENT_AMBULATORY_CARE_PROVIDER_SITE_OTHER): Admitting: Psychology

## 2023-11-25 DIAGNOSIS — F329 Major depressive disorder, single episode, unspecified: Secondary | ICD-10-CM

## 2023-11-25 DIAGNOSIS — F321 Major depressive disorder, single episode, moderate: Secondary | ICD-10-CM

## 2023-11-25 NOTE — Progress Notes (Signed)
 [facility] Counselor Initial Adult Exam  Name: Christopher Yu Date: 11/25/2023 MRN: [id number] DOB: December 02, 1960 PCP: Dwight Trula SQUIBB, MD    Guardian/Payee:  N/A    Paperwork requested: No   Reason for Visit Christopher Yu  Mental Status Exam: Appearance:   Casual     Behavior:  Appropriate  Motor:  Shuffling Gait  Speech/Language:   Slurred  Affect:  Depressed  Mood:  depressed  Thought process:  normal  Thought content:    WNL  Sensory/Perceptual disturbances:    WNL  Orientation:  oriented to person, place, and situation  Attention:  Good  Concentration:  Good  Memory:  WNL  Fund of knowledge:   Good  Insight:    Good  Judgment:   unknown  Impulse Control:  unknown    Reported Symptoms:  Sadness, helplessness and hopelessness  Risk Assessment: Danger to Self:  No Self-injurious Behavior: No Danger to Others: No Duty to Warn:no Physical Aggression / Violence:No  Access to Firearms a concern: Unknown Gang Involvement:No  Patient / guardian was educated about steps to take if suicide or homicide risk level increases between visits: n/a While future psychiatric events cannot be accurately predicted, the patient does not currently require acute inpatient psychiatric care and does not currently meet [place]  involuntary commitment criteria.  Substance Abuse History: Current substance abuse: No     Past Psychiatric History:   No previous psychological problems have been observed Outpatient  Providers:N/A History of Psych Hospitalization: No  Psychological Testing: none   Abuse History:  Victim of:  N/A  Report needed: No. Victim of Neglect:No. Perpetrator of N/A  Witness / Exposure to Domestic Violence: No   Protective Services Involvement: No  Witness to [employer] Violence:  No   Family History:  Family History  Problem Relation Age of Onset   Other Father        MVA   Lupus Sister     Living situation: the patient lives with their family  Sexual Orientation: Straight  Relationship Status: married  Name of spouse Christopher Yu If a parent, number of children / ages:Two girls, ages 71 and 57  Support Systems: spouse  Surveyor, quantity Stress:  Yes   Income/Employment/Disability: Software engineer: unknown  Educational History: Education: unknown  Oncologist: unknown  Any cultural differences that may affect / interfere with treatment:  not applicable   Recreation/Hobbies: golfing  Stressors: Financial difficulties   Health problems    Strengths: Family  Barriers:  severe physical limitations that compromises activity   Legal History: Pending legal issue / charges: The patient has no significant history of legal issues. History of legal issue / charges: N/A  Medical History/Surgical History: reviewed Past Medical History:  Diagnosis Date   Anxiety    Arthritis, rheumatoid (HCC)    dx 1988   CAD (coronary artery disease), native coronary artery    Mild LAD disease with calcification noted at cath 12//27/16    Cardiomyopathy (HCC)    Cardiomyopathy (HCC)    Chronic systolic heart failure (HCC) 05/01/2015   Depression    Deviated nasal septum 06/25/2011   Headache    High cholesterol    Hyperlipidemia    Impingement syndrome of left shoulder 12/22/2017   Macular degeneration    Nausea and vomiting 08/28/2022   Rheumatoid aortitis    Sleep apnea    does not use cpap - UNABLE TO TOLERATE MASK   Sleep apnea  in adult    deviated septum repaired, most recent sleep study was negative   Status post total right knee replacement 06/01/2015    Past Surgical History:  Procedure Laterality Date   ANKLE FUSION Right 05/05/2012   related to his arthritis   ANKLE FUSION Left 05/25/2013   related to his arthritis   ANKLE SURGERY Bilateral    APPLICATION OF CRANIAL NAVIGATION N/A 04/11/2022   Procedure: APPLICATION OF CRANIAL NAVIGATION;  Surgeon: Lanis Pupa, MD;  Location: [facility] OR;  Service: Neurosurgery;  Laterality: N/A;   APPLICATION OF CRANIAL NAVIGATION N/A 08/08/2022   Procedure: APPLICATION OF CRANIAL NAVIGATION;  Surgeon: Lanis Pupa, MD;  Location: [facility] OR;  Service: Neurosurgery;  Laterality: N/A;   BIOPSY  06/09/2023   Procedure: BIOPSY;  Surgeon: Saintclair Jasper, MD;  Location: [facility];  Service: Gastroenterology;;   CARDIAC CATHETERIZATION N/A 05/01/2015   Procedure: Left Heart Cath and Coronary Angiography;  Surgeon: Victory LELON Sharps, MD;  Location: [facility] INVASIVE CV LAB;  Service: Cardiovascular;  Laterality: N/A;   COLONOSCOPY WITH PROPOFOL  N/A 06/09/2023   Procedure: COLONOSCOPY WITH PROPOFOL ;  Surgeon: Saintclair Jasper, MD;  Location: [facility];  Service: Gastroenterology;  Laterality: N/A;   CRANIOTOMY N/A 04/11/2022   Procedure: STEREOTACTIC SUBOCCIPITAL CRANIOTOMY FOR RESECTION OF ARTERIO-VENOUS MALFORMATION;  Surgeon: Lanis Pupa, MD;  Location: [facility];  Service: Neurosurgery;  Laterality: N/A;   ESOPHAGOGASTRODUODENOSCOPY (EGD) WITH PROPOFOL  N/A 06/09/2023   Procedure: ESOPHAGOGASTRODUODENOSCOPY (EGD) WITH PROPOFOL ;  Surgeon: Saintclair Jasper, MD;  Location: [facility];  Service: Gastroenterology;  Laterality: N/A;   FRACTURE SURGERY     left femur fracture x 3   HEMOSTASIS CLIP PLACEMENT  06/09/2023   Procedure: HEMOSTASIS CLIP PLACEMENT;  Surgeon: Saintclair Jasper, MD;  Location: [facility];  Service: Gastroenterology;;   IR ANGIO EXTERNAL CAROTID SEL EXT CAROTID BILAT MOD SED   01/28/2022   IR ANGIO INTRA EXTRACRAN SEL INTERNAL CAROTID BILAT MOD SED  01/28/2022   IR ANGIO INTRA EXTRACRAN SEL INTERNAL CAROTID BILAT MOD SED  04/16/2022   IR ANGIO VERTEBRAL SEL VERTEBRAL BILAT MOD SED  01/28/2022   IR ANGIO VERTEBRAL SEL VERTEBRAL BILAT MOD SED  04/16/2022   KNEE ARTHROSCOPY     right x 4   LAPAROSCOPIC REVISION VENTRICULAR-PERITONEAL (V-P) SHUNT N/A 08/08/2022   Procedure: LAPAROSCOPIC VENTRICULAR-PERITONEAL (V-P) SHUNT;  Surgeon: Vanderbilt Ned, MD;  Location: [facility];  Service: General;  Laterality: N/A;   LUMBAR LAMINECTOMY/DECOMPRESSION MICRODISCECTOMY N/A 07/18/2022   Procedure: Repair of Pseudomeningiocele posterior;  Surgeon: Lanis Pupa, MD;  Location: [facility];  Service: Neurosurgery;  Laterality: N/A;   NASAL SEPTOPLASTY W/ TURBINOPLASTY  06/25/2011   Procedure: NASAL SEPTOPLASTY WITH TURBINATE REDUCTION;  Surgeon: Alm Bouche, MD;  Location: [facility] OR;  Service: ENT;  Laterality: Bilateral;   PLACEMENT OF LUMBAR DRAIN N/A 07/18/2022   Procedure: PLACEMENT OF LUMBAR DRAIN;  Surgeon: Lanis Pupa, MD;  Location: [facility];  Service: Neurosurgery;  Laterality: N/A;   POLYPECTOMY  06/09/2023   Procedure: POLYPECTOMY;  Surgeon: Saintclair Jasper, MD;  Location: [facility];  Service: Gastroenterology;;   TONSILLECTOMY     TOTAL KNEE ARTHROPLASTY Right 06/01/2015   Procedure: RIGHT TOTAL KNEE ARTHROPLASTY;  Surgeon: Lonni CINDERELLA Poli, MD;  Location: [facility];  Service: Orthopedics;  Laterality: Right;  Block+general   VENTRICULOPERITONEAL SHUNT Right 08/08/2022   Procedure: LAP ASSITED SHUNT INSERTION VENTRICULAR-PERITONEAL;  Surgeon: Lanis Pupa, MD;  Location: [facility];  Service: Neurosurgery;  Laterality: Right;    Medications: Current Outpatient Medications  Medication Sig Dispense Refill   acetaminophen  (TYLENOL ) 325 MG tablet Take 2 tablets (650 mg total) by mouth 3 (three) times daily.     alendronate (FOSAMAX) 10 MG tablet Take 10 mg by mouth daily  before breakfast. Take with a full glass of water  on an empty stomach.     artificial tears (LACRILUBE) OINT ophthalmic ointment Place into both eyes 2 (two) times daily.     aspirin  EC 81 MG tablet Take 1 tablet (81 mg total) by mouth daily. Swallow whole. 30 tablet 12   calcium  carbonate (TUMS - DOSED IN MG ELEMENTAL CALCIUM ) 500 MG chewable tablet Chew 1 tablet (200 mg of elemental calcium  total) by mouth daily with supper.     Cholecalciferol (VITAMIN D3) 50 MCG (2000 UT) TABS Take 2,000 Units by mouth daily.     famotidine (PEPCID) 40 MG tablet 1 tablet Orally Once a day for 30 days     folic acid  (FOLVITE ) 1 MG tablet Take 1 mg by mouth daily.     methotrexate  (RHEUMATREX) 2.5 MG tablet Take 20 mg by mouth once a week. Takes 8 tablets(20mg ) every Saturday.     Caution:Chemotherapy. Protect from light.     metoprolol  succinate (TOPROL -XL) 25 MG 24 hr tablet TAKE 1 TABLET DAILY 90 tablet 3   Multiple Vitamins-Minerals (PRESERVISION AREDS 2 PO) Take 2 capsules by mouth daily.     predniSONE  (STERAPRED UNI-PAK 21 TAB) 5 MG (21) TBPK tablet Take 5 mg by mouth every morning.     RITUXAN  500 MG/50ML injection See admin instructions. Every 6 months 2 dose infusion Ruxience      rosuvastatin  (CRESTOR ) 20 MG tablet Take 1 tablet (20 mg total) by mouth daily. Patient must schedule annual appointment for further refills first attempt 30 tablet 0   sertraline  (ZOLOFT ) 20 MG/ML concentrated solution Take 25 mg by mouth daily.     spironolactone  (ALDACTONE ) 25 MG tablet 12.5 mg. 1/2 tab daily     sulfaSALAzine  (AZULFIDINE ) 500 MG tablet Take 1,000 mg by mouth 2 (two) times daily.     traMADol  (ULTRAM ) 50 MG tablet Take 1 tablet (50 mg total) by mouth every 12 (twelve) hours as needed for moderate pain. (Patient taking differently: Take 50 mg by mouth as needed for moderate pain (pain score 4-6).) 60 tablet 2   traZODone  (DESYREL ) 50 MG tablet TAKE  1.5 TABLETS (75 MG TOTAL) BY MOUTH AT BEDTIME AS NEEDED. FOR  SLEEP 135 tablet 5   White Petrolatum -Mineral Oil (GENTEAL TEARS NIGHT-TIME OP) Apply to eye at bedtime.     No current facility-administered medications for this visit.    Allergies  Allergen Reactions   Hydroxychloroquine      Other Reaction(s): cardiolmyopathy    Diagnoses:  Major depression  Plan of Care: Outpatient psychotherapy and medication management as needed. Initial Note: (With wife Christopher Yu). She says that she has hoped Christopher Yu would get into therapy. He has Ataxic Tremor. He told Dr. Evonnie that he would like medication to just sit and do nothing. He says he always had golf to rely on and now cannot play. In December, he went n for a surgery to remove an avm from brain. Was told it was a routine surgery and was told that he needs to get it out. Was told it would be a short recovery and he would be back to work. 1 1/2 hour surgery turned into 6 hour surgery and he had complications. He had extra cerebral fluid and Dr. said it would absorb and just wait. He kept declining and by March, they went in to try and drain the extra fluid. He was in hospital for a week. Wife noticed that during the hospital stay, his speech declined and he was clearly weak. Nurses were trying to discharge him and she protested, but they sent him home anyway. He declined, and could not sit, stand, talk or walk and she ended up calling ambulance. In ER, she ws told he had a stroke. He was in ER 24 hours before getting a room in Neuro area. Head of stroke team came in and said it was not a stroke, but complications from surgery. She was blamed by medical team for not speaking up before being initially discharged. He ended up in a rehab for 2 weeks and told them he needed another surgery. Was told he had hydrocephalus and need a shunt to drain liquid off his brain. She felt a lot of pressure from team that he needed surgery. She wanted a second opinion and had rouble getting it. Spoke to another Dr. and they decided to get  the third surgery. He was in OT,PT and Speech Therapy. During therapy, he started having a tremor. They ended up being referred to Dr. Evonnie, who has now told him that treating his tremor is tricky and will take time.  Christopher Yu is no longer in OT,PT speech therapy. He is supposed to be doing exercises on his own, but has not been emotionally able to ge himself to do it. He has persistent headaches as well. Just had a CAT Scan and awaiting the results. His headaches are worse and they are concerned. He also has rhumatory arthritis and it is flaring up because he had to stop meds. He has cardiomyopathy, macular degeneration on top of all of these other conditions.  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 He has been approved for disability and are working to get social security disability. She works part time. They have 2 daughters, one with diabetes and arthritis. She is heading to [employer] this Fall and wants to work in Research scientist (physical sciences). Other daughter is 90 and is our introvert with lots of anxiety. She is in therapy and has mental health issues that is being treated. She will be a Holiday representative at  the middle college at [employer].  They are told that medicine will not control his condition, but they will have to try to retrain his brain. Suggested that they work to get back into OT and PT. She has a hard time motivating him. He admits that he keeps things inside. She says he is not a good comunicator. He will not share things like if he is in pain or hurts himself.   Tx. Plan/Goals: Patient reports depressive symptoms secondary to the medical trauma he has suffered and the debilitating physical results. He is seeking therapy to resolve his feelings of helplessness and hopelessness. Also want assistance in improving his drive/motivation in daily activity. Will engage in therapy that utilizes behavioral approaches to mitigate symptoms along with insight  oriented counseling. Will also incorporate some conjoint sessions with spouse as needed, given her integral involvement with his care. Patient agrees with plan for treatment. Goal Date is 12-25    Patient agrees to be seen in video (caregility) session and is aware of the limitations of this platform. He is at home and provider in home office. Session Note:  Patient states that he has had a good week. Talked about having his girls home for the summer. He says they are always home and spend a lot of time hanging out. He says that he has a PT referred to him to help with balance. Provided him the name/number of Christopher Yu. We talked about his degree of motivation to work back to playing golf again.  He will contact both.                                                                                                             CONI ALM KERNS, PhD 4:10p-5:00 50 minutes

## 2023-11-26 ENCOUNTER — Ambulatory Visit: Admitting: Speech Pathology

## 2023-11-26 DIAGNOSIS — R1312 Dysphagia, oropharyngeal phase: Secondary | ICD-10-CM | POA: Diagnosis not present

## 2023-11-26 DIAGNOSIS — R471 Dysarthria and anarthria: Secondary | ICD-10-CM | POA: Diagnosis not present

## 2023-11-26 NOTE — Therapy (Signed)
 OUTPATIENT SPEECH LANGUAGE PATHOLOGY TREATMENT NOTE   Patient Name: Christopher Yu MRN: [id number] DOB:July 21, 1960, 63 y.o., male Today's Date: 11/26/2023  PCP: Dwight Trula SQUIBB, MD REFERRING PROVIDER: Emeline Joesph BROCKS, DO  END OF SESSION:  End of Session - 11/26/23 1528     Visit Number 7    Number of Visits 17    Date for SLP Re-Evaluation 01/15/24   extedned for additional visits   Authorization Type [employer]    SLP Start Time 1533    SLP Stop Time  1615    SLP Time Calculation (min) 42 min    Activity Tolerance Patient tolerated treatment well          Past Medical History:  Diagnosis Date   Anxiety    Arthritis, rheumatoid (HCC)    dx 1988   CAD (coronary artery disease), native coronary artery    Mild LAD disease with calcification noted at cath 12//27/16    Cardiomyopathy (HCC)    Cardiomyopathy (HCC)    Chronic systolic heart failure (HCC) 05/01/2015   Depression    Deviated nasal septum 06/25/2011   Headache    High cholesterol    Hyperlipidemia    Impingement syndrome of left shoulder 12/22/2017   Macular degeneration    Nausea and vomiting 08/28/2022   Rheumatoid aortitis    Sleep apnea    does not use cpap - UNABLE TO TOLERATE MASK   Sleep apnea in adult    deviated septum repaired, most recent sleep study was negative   Status post total right knee replacement 06/01/2015   Past Surgical History:  Procedure Laterality Date   ANKLE FUSION Right 05/05/2012   related to his arthritis   ANKLE FUSION Left 05/25/2013   related to his arthritis   ANKLE SURGERY Bilateral    APPLICATION OF CRANIAL NAVIGATION N/A 04/11/2022   Procedure: APPLICATION OF CRANIAL NAVIGATION;  Surgeon: Lanis Pupa, MD;  Location: [facility];  Service: Neurosurgery;  Laterality: N/A;   APPLICATION OF CRANIAL NAVIGATION N/A 08/08/2022   Procedure: APPLICATION OF CRANIAL NAVIGATION;  Surgeon: Lanis Pupa, MD;  Location: [facility];  Service: Neurosurgery;  Laterality: N/A;    BIOPSY  06/09/2023   Procedure: BIOPSY;  Surgeon: Saintclair Jasper, MD;  Location: [facility];  Service: Gastroenterology;;   CARDIAC CATHETERIZATION N/A 05/01/2015   Procedure: Left Heart Cath and Coronary Angiography;  Surgeon: Victory LELON Sharps, MD;  Location: [facility] INVASIVE CV LAB;  Service: Cardiovascular;  Laterality: N/A;   COLONOSCOPY WITH PROPOFOL  N/A 06/09/2023   Procedure: COLONOSCOPY WITH PROPOFOL ;  Surgeon: Saintclair Jasper, MD;  Location: [facility];  Service: Gastroenterology;  Laterality: N/A;   CRANIOTOMY N/A 04/11/2022   Procedure: STEREOTACTIC SUBOCCIPITAL CRANIOTOMY FOR RESECTION OF ARTERIO-VENOUS MALFORMATION;  Surgeon: Lanis Pupa, MD;  Location: [facility];  Service: Neurosurgery;  Laterality: N/A;   ESOPHAGOGASTRODUODENOSCOPY (EGD) WITH PROPOFOL  N/A 06/09/2023   Procedure: ESOPHAGOGASTRODUODENOSCOPY (EGD) WITH PROPOFOL ;  Surgeon: Saintclair Jasper, MD;  Location: [facility];  Service: Gastroenterology;  Laterality: N/A;   FRACTURE SURGERY     left femur fracture x 3   HEMOSTASIS CLIP PLACEMENT  06/09/2023   Procedure: HEMOSTASIS CLIP PLACEMENT;  Surgeon: Saintclair Jasper, MD;  Location: [facility];  Service: Gastroenterology;;   IR ANGIO EXTERNAL CAROTID SEL EXT CAROTID BILAT MOD SED  01/28/2022   IR ANGIO INTRA EXTRACRAN SEL INTERNAL CAROTID BILAT MOD SED  01/28/2022   IR ANGIO INTRA EXTRACRAN SEL INTERNAL CAROTID BILAT MOD SED  04/16/2022   IR ANGIO VERTEBRAL SEL VERTEBRAL  BILAT MOD SED  01/28/2022   IR ANGIO VERTEBRAL SEL VERTEBRAL BILAT MOD SED  04/16/2022   KNEE ARTHROSCOPY     right x 4   LAPAROSCOPIC REVISION VENTRICULAR-PERITONEAL (V-P) SHUNT N/A 08/08/2022   Procedure: LAPAROSCOPIC VENTRICULAR-PERITONEAL (V-P) SHUNT;  Surgeon: Vanderbilt Ned, MD;  Location: [facility];  Service: General;  Laterality: N/A;   LUMBAR LAMINECTOMY/DECOMPRESSION MICRODISCECTOMY N/A 07/18/2022   Procedure: Repair of Pseudomeningiocele posterior;  Surgeon: Lanis Pupa, MD;  Location: [facility];  Service:  Neurosurgery;  Laterality: N/A;   NASAL SEPTOPLASTY W/ TURBINOPLASTY  06/25/2011   Procedure: NASAL SEPTOPLASTY WITH TURBINATE REDUCTION;  Surgeon: Alm Bouche, MD;  Location: [facility] OR;  Service: ENT;  Laterality: Bilateral;   PLACEMENT OF LUMBAR DRAIN N/A 07/18/2022   Procedure: PLACEMENT OF LUMBAR DRAIN;  Surgeon: Lanis Pupa, MD;  Location: [facility];  Service: Neurosurgery;  Laterality: N/A;   POLYPECTOMY  06/09/2023   Procedure: POLYPECTOMY;  Surgeon: Saintclair Jasper, MD;  Location: [facility];  Service: Gastroenterology;;   TONSILLECTOMY     TOTAL KNEE ARTHROPLASTY Right 06/01/2015   Procedure: RIGHT TOTAL KNEE ARTHROPLASTY;  Surgeon: Lonni CINDERELLA Poli, MD;  Location: [facility];  Service: Orthopedics;  Laterality: Right;  Block+general   VENTRICULOPERITONEAL SHUNT Right 08/08/2022   Procedure: LAP ASSITED SHUNT INSERTION VENTRICULAR-PERITONEAL;  Surgeon: Lanis Pupa, MD;  Location: [facility];  Service: Neurosurgery;  Laterality: Right;   Patient Active Problem List   Diagnosis Date Noted   Dysphagia 05/04/2023   At risk for central sleep apnea 03/05/2023   Chronic nonintractable headache 02/02/2023   Insomnia 10/20/2022   Anxiety and depression 10/20/2022   Nausea and vomiting 08/28/2022   Adjustment disorder 08/06/2022   Protein-calorie malnutrition, severe 07/31/2022   Cerebellar stroke (HCC) 07/29/2022   Ischemic cerebrovascular accident (CVA) (HCC) 07/28/2022   Prolonged QT interval 07/28/2022   Pseudomeningocele 07/18/2022   Pseudomeningocele due to surgical procedure 07/18/2022   Status post craniotomy 04/11/2022   AVM (arteriovenous malformation) brain 04/11/2022   Impingement syndrome of left shoulder 12/22/2017   Impingement syndrome of right shoulder 12/22/2017   Rheumatoid arthritis involving right knee (HCC) 06/01/2015   Status post total right knee replacement 06/01/2015   Cardiomyopathy (HCC) 05/02/2015   Hyperlipidemia    Sleep apnea in adult    Rheumatoid  arthritis (HCC)    Chronic systolic heart failure (HCC) 05/01/2015   CAD (coronary artery disease), native coronary artery     ONSET DATE: 08/13/2023 (referral date); CVA 07/28/22  REFERRING DIAG: R13.10 (ICD-10-CM) - Dysphagia, unspecified type I63.9 (ICD-10-CM) - Cerebellar stroke (HCC)  THERAPY DIAG:  Dysphagia, oropharyngeal phase  Dysarthria and anarthria  Rationale for Evaluation and Treatment: Rehabilitation  SUBJECTIVE:   SUBJECTIVE STATEMENT: pt reports practicing with sentences, EMST, swallow exercises   PERTINENT HISTORY: Mr. Rusher is seen today referred for  MBSS and outpatient ST as he and his wife both reported increased coughing with solids and thin liquids, which prompted scheduling MBSS. Pt has a hx of dysarthria and dysphagia following stroke in March 2024. Prior MBSS completed 08/18/22 with recommendation of a regular diet, nectar-thick liquids, and L head turn strategy. He completed outpatient SLP tx in June 2024 and self-advanced diet to regular solids and thin liquids and he had elected to defer head turn left with swallow, therefore a follow up MBSS was forgone. Per Mr. Habib and wife, he has not had any concerns for pneumonia or pulmonary infection since advancing to thin liquids, despite coughing occurences.  PAIN:  Are you having  pain? No  FALLS: Has patient fallen in last 6 months?  No  LIVING ENVIRONMENT: Lives with: lives with their family Lives in: House/apartment  PLOF:  Level of assistance: Independent with ADLs, Needed assistance with IADLS Employment: On disability  PATIENT GOALS: To improve my ability to communicate  OBJECTIVE:  Note: Objective measures were completed at Evaluation unless otherwise noted. OBJECTIVE:   INSTRUMENTAL SWALLOW STUDY FINDINGS (MBSS)  Objective swallow impairments: There was frank aspiration with delayed cough response of thin liquids by cup with head in neutral position.  -No penetration or aspiration of  pudding or cracker trial. -L head turn significantly aided airway protection with thin liquids. While it did not completely eliminate aspiration risk, the amount of penetration/aspiration was significantly reduced. Continued to observe deep, stagnant penetration of thin liquids (in isolation) with use of L head turn.  -L Head turn plus Chin tuck resulted in trace silent aspiration of thin liquids. Chin tuck not recommended.  Objective recommended compensations:    -Regular diet  -Thin liquids with L Head Turn -PO meds whole in pudding or applesauce -Consider nectar-thick liquids if Mr. Nick has difficulty remembering L head turn strategy or if coughing with thin liquids becomes bothersome.  -Follow up with OP SLP    COGNITION: Overall cognitive status: Impaired Areas of impairment:  Attention: Impaired: Alternating, Divided Awareness: Impaired: Anticipatory Functional deficits:   SUBJECTIVE DYSPHAGIA REPORTS:  Date of onset: 07/28/22 Reported symptoms: coughing with liquids and choking with liquids  Current diet: regular and thin liquids  Co-morbid voice changes: No  FACTORS WHICH MAY INCREASE RISK OF ADVERSE EVENT IN PRESENCE OF ASPIRATION:  General health: poor general health  Risk factors: none evident     ORAL MOTOR EXAMINATION: Overall status: Impaired:   Labial: Bilateral (Coordination) Lingual: Bilateral (Coordination) Velum: Coordination Comments:   CLINICAL SWALLOW ASSESSMENT:   Dentition: adequate natural dentition Vocal quality at baseline: normal Patient directly observed with POs: Yes: thin liquids  Feeding: able to feed self Liquids provided by: coke bottle [facility] Swallow Protocol: n/a Oral phase signs and symptoms: prolonged bolus formation Pharyngeal phase signs and symptoms: immediate cough                                                                                                                          TREATMENT DATE:  11/26/23: Swallow reviewed  HEP recommendations, pt is not completing per the recommended frequency. He is only doing 10, QID. SLP led pt through x40 effortful swallows, using stopwatch for feedback of time commitment required. Pt completed 5 sets of 5 reps of EMST at current setting, rating as challenging. Did not adjust device today. Rare min-A for accuracy completion.   Speech reviewed clear speech strategies. Targeted carryover at increasing levels of difficulty. Word level, pt 100% accuracy, generative sentences 80% accuracy, evidencing telescoping d/t decreased coordination of breath and speaking. In conversation, pt with degradation of quality over extended utterances with SLP providing feedback to enhance understanding of  factors impacting intelligibility.   Swallow:Initiated training in HEP for dysphagia - pt required frequent mod A for effortful swallow, masako, CTAR - unable to perform Mendelson with max A. Millard states I can't swallow 5x in a row. Reviewed swallowing precautions - with verbal cues Jawuan demonstrated head turn left 5/5 sips and verbalized effortful swallow. Effortful swallow employed to support mindfulness and intent when swallowing. He states he forgets to turn his head left - we generated strategies of placing signs to remind him, placing TV to the left when he eats (although I recommend eliminating distractions with meals)and eating at a table. He reports biting his cheeks and tongue during meals. Instructed in using strategy of chewing and swallowing with intent to improve awareness. Education provided on using head turn and swallowing with intent with mixed consistencies as he does eat cereal and soup regularly.  Speech: initiated training in strategies to support intelligibility and expression including saying Liz's name and making sure he gets her attention before he starts speaking, as well as saying 1`-2 key words rather than pointing.    PATIENT EDUCATION: Education details: See Treatment  above; HEP for dysphagia; swallow precautions; diet modifications; communication strategies Person educated: Patient and Spouse Education method: Explanation, Demonstration, Verbal cues, and Handouts Education comprehension: verbalized understanding, returned demonstration, verbal cues required, and needs further education   ASSESSMENT:  CLINICAL IMPRESSION: Patient is a 63 y.o. male who was seen today for mild to moderate oropharyngeal dysphagia; mild ataxic dysarthria; mild cognitive communication impairment. He is well known to us  from prior course of outpatient ST May and June of 2024. He has not carried over recommendation for head turn left and reports increased coughing with meals. [name] worries about him choking when she is not home. He is eating only soft foods when she is at work. Gregori reports he is having difficulty communicating quickly and [name] endorses he is not talking as much, but pointing, even to get her attention. She states that he just starts talking and she doesn't know who he is talking to and she has to request information be repeated. Overall poor anticipatory awareness in lack of carrying over head turn left, not getting attention before speaking, biting his cheeks and tongue. Skilled ST thus far focused on dysphagia exercise program and safety training, dysarthria strategies and compensations. Use of clear speech as rehabilitative technique to increase awareness and communication clarity. I recommend ongoing ST.   OBJECTIVE IMPAIRMENTS: include executive functioning, expressive language, dysarthria, and dysphagia. These impairments are limiting patient from return to work, managing medications, managing finances, ADLs/IADLs, effectively communicating at home and in community, and safety when swallowing. Factors affecting potential to achieve goals and functional outcome are cooperation/participation level. Patient will benefit from skilled SLP services to address above  impairments and improve overall function.  REHAB POTENTIAL: Fair due to time post onset, cognitive impairments   GOALS: Goals reviewed with patient? Yes  SHORT TERM GOALS: Target date: 11/12/23 (extended due to scheduling)  Pt will complete HEP for dysphagia with rare min A  Baseline: Goal status: MET  2.  Pt will follow swallow precautions and diet modifications with rare min A Baseline:  Goal status: INITIAL  3.  Pt will verbally get person's attention and eye contact prior to speaking with rare min A Baseline:  Goal status: PARTIALLY MET  4.  Pt will report 50% reduced biting of his cheek and tongue while eating by chewing and swallowing with intent  Baseline:  Goal status: MET  LONG TERM GOALS: Target date: 01/15/2024 (for recertification)   Pt will complete HEP at least 1x a day for 1 week Baseline:  Goal status: INITIAL  2.  Pt will carryover swallow precautions including eating with intent and head turn left with mod I Baseline:  Goal status: INITIAL  3.  Spouse will report 50% need to request repetition or clarification when Nhia is talking Baseline:  Goal status: INITIAL  4.  Pt and spouse will carryover 2 compensatory strategies including environmental modifications to support processing and understanding of conversations Baseline:  Goal status: INITIAL   PLAN:  SLP FREQUENCY: 2x/week  SLP DURATION: 6 weeks - POC dates extended due to scheduling  PLANNED INTERVENTIONS: Aspiration precaution training, Pharyngeal strengthening exercises, Diet toleration management , Language facilitation, Environmental controls, Trials of upgraded texture/liquids, Cueing hierachy, Cognitive reorganization, Internal/external aids, Functional tasks, Multimodal communication approach, SLP instruction and feedback, Compensatory strategies, Patient/family education, [phone] Treatment of speech (30 or 45 min) , and [id number] Treatment of swallowing function    Harlene LITTIE Ned,  CCC-SLP 11/26/2023, 3:36 PM

## 2023-12-01 ENCOUNTER — Ambulatory Visit (INDEPENDENT_AMBULATORY_CARE_PROVIDER_SITE_OTHER): Admitting: Psychology

## 2023-12-01 DIAGNOSIS — F329 Major depressive disorder, single episode, unspecified: Secondary | ICD-10-CM

## 2023-12-01 DIAGNOSIS — F321 Major depressive disorder, single episode, moderate: Secondary | ICD-10-CM

## 2023-12-01 NOTE — Progress Notes (Signed)
 [facility] Counselor Initial Adult Exam  Name: Christopher Yu Date: 12/01/2023 MRN: [id number] DOB: 02/01/1961 PCP: Dwight Trula SQUIBB, MD    Guardian/Payee:  N/A    Paperwork requested: No   Reason for Visit [name] Problem: Adjustment to serious medical illness  Mental Status Exam: Appearance:   Casual     Behavior:  Appropriate  Motor:  Shuffling Gait  Speech/Language:   Slurred  Affect:  Depressed  Mood:  depressed  Thought process:  normal  Thought content:    WNL  Sensory/Perceptual disturbances:    WNL  Orientation:  oriented to person, place, and situation  Attention:  Good  Concentration:  Good  Memory:  WNL  Fund of knowledge:   Good  Insight:    Good  Judgment:   unknown  Impulse Control:  unknown    Reported Symptoms:  Sadness, helplessness and hopelessness  Risk Assessment: Danger to Self:  No Self-injurious Behavior: No Danger to Others: No Duty to Warn:no Physical Aggression / Violence:No  Access to Firearms a concern: Unknown Gang Involvement:No  Patient / guardian was educated about steps to take if suicide or homicide risk level increases between visits: n/a While future psychiatric events cannot be accurately predicted, the patient does not currently require acute inpatient psychiatric care and does not currently meet [place]  involuntary commitment criteria.  Substance Abuse History: Current substance abuse: No     Past Psychiatric History:   No previous psychological problems have been  observed Outpatient Providers:N/A History of Psych Hospitalization: No  Psychological Testing: none   Abuse History:  Victim of:  N/A  Report needed: No. Victim of Neglect:No. Perpetrator of N/A  Witness / Exposure to Domestic Violence: No   Protective Services Involvement: No  Witness to [employer] Violence:  No   Family History:  Family History  Problem Relation Age of Onset   Other Father        MVA   Lupus Sister     Living situation: the patient lives with their family  Sexual Orientation: Straight  Relationship Status: married  Name of spouse Christopher Yu If a parent, number of children / ages:Two girls, ages 3 and 72  Support Systems: spouse  Financial Stress:  Yes   Income/Employment/Disability:  Long-Term Art gallery manager Service: unknown  Educational History: Education: unknown  Religion/Sprituality/World View: unknown  Any cultural differences that may affect / interfere with treatment:  not applicable   Recreation/Hobbies: golfing  Stressors: Financial difficulties   Health problems    Strengths: Family  Barriers:  severe physical limitations that compromises activity   Legal History: Pending legal issue / charges: The patient has no significant history of legal issues. History of legal issue / charges: N/A  Medical History/Surgical History: reviewed Past Medical History:  Diagnosis Date   Anxiety    Arthritis, rheumatoid (HCC)    dx 1988   CAD (coronary artery disease), native coronary artery    Mild LAD disease with calcification noted at cath 12//27/16    Cardiomyopathy (HCC)    Cardiomyopathy (HCC)    Chronic systolic heart failure (HCC) 05/01/2015   Depression    Deviated nasal septum 06/25/2011   Headache    High cholesterol    Hyperlipidemia    Impingement syndrome of left shoulder 12/22/2017   Macular degeneration    Nausea and vomiting 08/28/2022   Rheumatoid aortitis    Sleep apnea    does not use cpap - UNABLE TO TOLERATE  MASK   Sleep apnea in adult    deviated septum repaired, most recent sleep study was negative   Status post total right knee replacement 06/01/2015    Past Surgical History:  Procedure Laterality Date   ANKLE FUSION Right 05/05/2012   related to his arthritis   ANKLE FUSION Left 05/25/2013   related to his arthritis   ANKLE SURGERY Bilateral    APPLICATION OF CRANIAL NAVIGATION N/A 04/11/2022   Procedure: APPLICATION OF CRANIAL NAVIGATION;  Surgeon: Lanis Pupa, MD;  Location: [facility];  Service: Neurosurgery;  Laterality: N/A;   APPLICATION OF CRANIAL NAVIGATION N/A 08/08/2022   Procedure: APPLICATION OF CRANIAL NAVIGATION;  Surgeon: Lanis Pupa, MD;  Location: [facility] OR;  Service: Neurosurgery;  Laterality: N/A;   BIOPSY  06/09/2023   Procedure: BIOPSY;  Surgeon: Saintclair Jasper, MD;  Location: [facility];  Service: Gastroenterology;;   CARDIAC CATHETERIZATION N/A 05/01/2015   Procedure: Left Heart Cath and Coronary Angiography;  Surgeon: Victory LELON Sharps, MD;  Location: [facility] INVASIVE CV LAB;  Service: Cardiovascular;  Laterality: N/A;   COLONOSCOPY WITH PROPOFOL  N/A 06/09/2023   Procedure: COLONOSCOPY WITH PROPOFOL ;  Surgeon: Saintclair Jasper, MD;  Location: [facility];  Service: Gastroenterology;  Laterality: N/A;   CRANIOTOMY N/A 04/11/2022   Procedure: STEREOTACTIC SUBOCCIPITAL CRANIOTOMY FOR RESECTION OF ARTERIO-VENOUS MALFORMATION;  Surgeon: Lanis Pupa, MD;  Location: [facility];  Service: Neurosurgery;  Laterality: N/A;   ESOPHAGOGASTRODUODENOSCOPY (EGD) WITH PROPOFOL  N/A 06/09/2023   Procedure: ESOPHAGOGASTRODUODENOSCOPY (EGD) WITH PROPOFOL ;  Surgeon: Saintclair Jasper, MD;  Location: [facility];  Service: Gastroenterology;  Laterality: N/A;   FRACTURE SURGERY     left femur fracture x 3   HEMOSTASIS CLIP PLACEMENT  06/09/2023   Procedure: HEMOSTASIS CLIP PLACEMENT;  Surgeon: Saintclair Jasper, MD;  Location: [facility];  Service: Gastroenterology;;   IR ANGIO EXTERNAL CAROTID SEL EXT CAROTID  BILAT MOD SED  01/28/2022   IR ANGIO INTRA EXTRACRAN SEL INTERNAL CAROTID BILAT MOD SED  01/28/2022   IR ANGIO INTRA EXTRACRAN SEL INTERNAL CAROTID BILAT MOD SED  04/16/2022   IR ANGIO VERTEBRAL SEL VERTEBRAL BILAT MOD SED  01/28/2022   IR ANGIO VERTEBRAL SEL VERTEBRAL BILAT MOD SED  04/16/2022   KNEE ARTHROSCOPY     right x 4   LAPAROSCOPIC REVISION VENTRICULAR-PERITONEAL (  V-P) SHUNT N/A 08/08/2022   Procedure: LAPAROSCOPIC VENTRICULAR-PERITONEAL (V-P) SHUNT;  Surgeon: Vanderbilt Ned, MD;  Location: [facility];  Service: General;  Laterality: N/A;   LUMBAR LAMINECTOMY/DECOMPRESSION MICRODISCECTOMY N/A 07/18/2022   Procedure: Repair of Pseudomeningiocele posterior;  Surgeon: Lanis Pupa, MD;  Location: [facility];  Service: Neurosurgery;  Laterality: N/A;   NASAL SEPTOPLASTY W/ TURBINOPLASTY  06/25/2011   Procedure: NASAL SEPTOPLASTY WITH TURBINATE REDUCTION;  Surgeon: Alm Bouche, MD;  Location: [facility] OR;  Service: ENT;  Laterality: Bilateral;   PLACEMENT OF LUMBAR DRAIN N/A 07/18/2022   Procedure: PLACEMENT OF LUMBAR DRAIN;  Surgeon: Lanis Pupa, MD;  Location: [facility];  Service: Neurosurgery;  Laterality: N/A;   POLYPECTOMY  06/09/2023   Procedure: POLYPECTOMY;  Surgeon: Saintclair Jasper, MD;  Location: [facility];  Service: Gastroenterology;;   TONSILLECTOMY     TOTAL KNEE ARTHROPLASTY Right 06/01/2015   Procedure: RIGHT TOTAL KNEE ARTHROPLASTY;  Surgeon: Lonni CINDERELLA Poli, MD;  Location: [facility];  Service: Orthopedics;  Laterality: Right;  Block+general   VENTRICULOPERITONEAL SHUNT Right 08/08/2022   Procedure: LAP ASSITED SHUNT INSERTION VENTRICULAR-PERITONEAL;  Surgeon: Lanis Pupa, MD;  Location: [facility];  Service: Neurosurgery;  Laterality: Right;    Medications: Current Outpatient Medications  Medication Sig Dispense Refill   acetaminophen  (TYLENOL ) 325 MG tablet Take 2 tablets (650 mg total) by mouth 3 (three) times daily.     alendronate (FOSAMAX) 10 MG tablet Take 10 mg by  mouth daily before breakfast. Take with a full glass of water  on an empty stomach.     artificial tears (LACRILUBE) OINT ophthalmic ointment Place into both eyes 2 (two) times daily.     aspirin  EC 81 MG tablet Take 1 tablet (81 mg total) by mouth daily. Swallow whole. 30 tablet 12   calcium  carbonate (TUMS - DOSED IN MG ELEMENTAL CALCIUM ) 500 MG chewable tablet Chew 1 tablet (200 mg of elemental calcium  total) by mouth daily with supper.     Cholecalciferol (VITAMIN D3) 50 MCG (2000 UT) TABS Take 2,000 Units by mouth daily.     famotidine (PEPCID) 40 MG tablet 1 tablet Orally Once a day for 30 days     folic acid  (FOLVITE ) 1 MG tablet Take 1 mg by mouth daily.     methotrexate  (RHEUMATREX) 2.5 MG tablet Take 20 mg by mouth once a week. Takes 8 tablets(20mg ) every Saturday.     Caution:Chemotherapy. Protect from light.     metoprolol  succinate (TOPROL -XL) 25 MG 24 hr tablet TAKE 1 TABLET DAILY 90 tablet 3   Multiple Vitamins-Minerals (PRESERVISION AREDS 2 PO) Take 2 capsules by mouth daily.     predniSONE  (STERAPRED UNI-PAK 21 TAB) 5 MG (21) TBPK tablet Take 5 mg by mouth every morning.     RITUXAN  500 MG/50ML injection See admin instructions. Every 6 months 2 dose infusion Ruxience      rosuvastatin  (CRESTOR ) 20 MG tablet Take 1 tablet (20 mg total) by mouth daily. Patient must schedule annual appointment for further refills first attempt 30 tablet 0   sertraline  (ZOLOFT ) 20 MG/ML concentrated solution Take 25 mg by mouth daily.     spironolactone  (ALDACTONE ) 25 MG tablet 12.5 mg. 1/2 tab daily     sulfaSALAzine  (AZULFIDINE ) 500 MG tablet Take 1,000 mg by mouth 2 (two) times daily.     traMADol  (ULTRAM ) 50 MG tablet Take 1 tablet (50 mg total) by mouth every 12 (twelve) hours as needed for moderate pain. (Patient taking differently: Take 50 mg by mouth as needed  for moderate pain (pain score 4-6).) 60 tablet 2   traZODone  (DESYREL ) 50 MG tablet TAKE 1.5 TABLETS (75 MG TOTAL) BY MOUTH AT BEDTIME AS  NEEDED. FOR SLEEP 135 tablet 5   White Petrolatum -Mineral Oil (GENTEAL TEARS NIGHT-TIME OP) Apply to eye at bedtime.     No current facility-administered medications for this visit.    Allergies  Allergen Reactions   Hydroxychloroquine      Other Reaction(s): cardiolmyopathy    Diagnoses:  Major depression  Plan of Care: Outpatient psychotherapy and medication management as needed. Initial Note: (With wife Christopher Yu). She says that she has hoped Kerman would get into therapy. He has Ataxic Tremor. He told Dr. Evonnie that he would like medication to just sit and do nothing. He says he always had golf to rely on and now cannot play. In December, he went n for a surgery to remove an avm from brain. Was told it was a routine surgery and was told that he needs to get it out. Was told it would be a short recovery and he would be back to work. 1 1/2 hour surgery turned into 6 hour surgery and he had complications. He had extra cerebral fluid and Dr. said it would absorb and just wait. He kept declining and by March, they went in to try and drain the extra fluid. He was in hospital for a week. Wife noticed that during the hospital stay, his speech declined and he was clearly weak. Nurses were trying to discharge him and she protested, but they sent him home anyway. He declined, and could not sit, stand, talk or walk and she ended up calling ambulance. In ER, she ws told he had a stroke. He was in ER 24 hours before getting a room in Neuro area. Head of stroke team came in and said it was not a stroke, but complications from surgery. She was blamed by medical team for not speaking up before being initially discharged. He ended up in a rehab for 2 weeks and told them he needed another surgery. Was told he had hydrocephalus and need a shunt to drain liquid off his brain. She felt a lot of pressure from team that he needed surgery. She wanted a second opinion and had rouble getting it. Spoke to another Dr. and they  decided to get the third surgery. He was in OT,PT and Speech Therapy. During therapy, he started having a tremor. They ended up being referred to Dr. Evonnie, who has now told him that treating his tremor is tricky and will take time.  Christopher Yu is no longer in OT,PT speech therapy. He is supposed to be doing exercises on his own, but has not been emotionally able to ge himself to do it. He has persistent headaches as well. Just had a CAT Scan and awaiting the results. His headaches are worse and they are concerned. He also has rhumatory arthritis and it is flaring up because he had to stop meds. He has cardiomyopathy, macular degeneration on top of all of these other conditions.  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 He has been approved for disability and are working to get social security disability. She works part time. They have 2 daughters, one with diabetes and arthritis. She is heading to [employer] this Fall and wants to work in Research scientist (physical sciences). Other daughter is 37 and is our introvert with lots of anxiety. She is  in therapy and has mental health issues that is being treated. She will be a Holiday representative at the middle college at [employer].  They are told that medicine will not control his condition, but they will have to try to retrain his brain. Suggested that they work to get back into OT and PT. She has a hard time motivating him. He admits that he keeps things inside. She says he is not a good comunicator. He will not share things like if he is in pain or hurts himself.   Tx. Plan/Goals: Patient reports depressive symptoms secondary to the medical trauma he has suffered and the debilitating physical results. He is seeking therapy to resolve his feelings of helplessness and hopelessness. Also want assistance in improving his drive/motivation in daily activity. Will engage in therapy that utilizes behavioral approaches to mitigate symptoms along  with insight oriented counseling. Will also incorporate some conjoint sessions with spouse as needed, given her integral involvement with his care. Patient agrees with plan for treatment. Goal Date is 12-25    Patient agrees to be seen in video (caregility) session and is aware of the limitations of this platform. He is at home and provider in home office. Session Note:  Patient states that he is doing well. Has been going to speech therapy and finds that it is helpful in some ways. Isn't in any other therapies. Says he gets tired of sitting in front of TV every day except for checking mail and mowing lawn weekly. He says that he writes in a journal every day. We talked about the importance of understanding that which we can versus cannot control. He feels he has been able to mostly do this with his medical circumstances. Claims he doesn't stress over those things he cannot control.                                                                                                                 CONI ALM KERNS, PhD 2:10p-3:00 50 minutes

## 2023-12-07 DIAGNOSIS — G4733 Obstructive sleep apnea (adult) (pediatric): Secondary | ICD-10-CM | POA: Diagnosis not present

## 2023-12-08 ENCOUNTER — Ambulatory Visit (INDEPENDENT_AMBULATORY_CARE_PROVIDER_SITE_OTHER): Admitting: Psychology

## 2023-12-08 DIAGNOSIS — F329 Major depressive disorder, single episode, unspecified: Secondary | ICD-10-CM | POA: Diagnosis not present

## 2023-12-08 DIAGNOSIS — F321 Major depressive disorder, single episode, moderate: Secondary | ICD-10-CM

## 2023-12-08 NOTE — Progress Notes (Signed)
 [facility] Counselor Initial Adult Exam  Name: Christopher Yu Date: 12/08/2023 MRN: [id number] DOB: Mar 30, 1961 PCP: Dwight Trula SQUIBB, MD    Guardian/Payee:  N/A    Paperwork requested: No   Reason for Visit Christopher Yu Problem: Adjustment to serious medical illness  Mental Status Exam: Appearance:   Casual     Behavior:  Appropriate  Motor:  Shuffling Gait  Speech/Language:   Slurred  Affect:  Depressed  Mood:  depressed  Thought process:  normal  Thought content:    WNL  Sensory/Perceptual disturbances:    WNL  Orientation:  oriented to person, place, and situation  Attention:  Good  Concentration:  Good  Memory:  WNL  Fund of knowledge:   Good  Insight:    Good  Judgment:   unknown  Impulse Control:  unknown    Reported Symptoms:  Sadness, helplessness and hopelessness  Risk Assessment: Danger to Self:  No Self-injurious Behavior: No Danger to Others: No Duty to Warn:no Physical Aggression / Violence:No  Access to Firearms a concern: Unknown Gang Involvement:No  Patient / guardian was educated about steps to take if suicide or homicide risk level increases between visits: n/a While future psychiatric events cannot be accurately predicted, the patient does not currently require acute inpatient psychiatric care and does not currently meet [place]  involuntary commitment criteria.  Substance Abuse History: Current substance abuse: No     Past Psychiatric History:   No previous psychological  problems have been observed Outpatient Providers:N/A History of Psych Hospitalization: No  Psychological Testing: none   Abuse History:  Victim of:  N/A  Report needed: No. Victim of Neglect:No. Perpetrator of N/A  Witness / Exposure to Domestic Violence: No   Protective Services Involvement: No  Witness to [employer] Violence:  No   Family History:  Family History  Problem Relation Age of Onset   Other Father        MVA   Lupus Sister     Living situation: the patient lives with their family  Sexual Orientation: Straight  Relationship Status: married  Name of spouse Christopher Yu If a parent, number of children / ages:Two girls, ages  18 and 16  Support Systems: spouse  Surveyor, quantity Stress:  Yes   Income/Employment/Disability: Software engineer: unknown  Educational History: Education: unknown  Religion/Sprituality/World View: unknown  Any cultural differences that may affect / interfere with treatment:  not applicable   Recreation/Hobbies: golfing  Stressors: Financial difficulties   Health problems    Strengths: Family  Barriers:  severe physical limitations that compromises activity   Legal History: Pending legal issue / charges: The patient has no significant history of legal issues. History of legal issue / charges: N/A  Medical History/Surgical History: reviewed Past Medical History:  Diagnosis Date   Anxiety    Arthritis, rheumatoid (HCC)    dx 1988   CAD (coronary artery disease), native coronary artery    Mild LAD disease with calcification noted at cath 12//27/16    Cardiomyopathy (HCC)    Cardiomyopathy (HCC)    Chronic systolic heart failure (HCC) 05/01/2015   Depression    Deviated nasal septum 06/25/2011   Headache    High cholesterol    Hyperlipidemia    Impingement syndrome of left shoulder 12/22/2017   Macular degeneration    Nausea and vomiting 08/28/2022   Rheumatoid aortitis    Sleep apnea    does not use cpap -  UNABLE TO TOLERATE MASK   Sleep apnea in adult    deviated septum repaired, most recent sleep study was negative   Status post total right knee replacement 06/01/2015    Past Surgical History:  Procedure Laterality Date   ANKLE FUSION Right 05/05/2012   related to his arthritis   ANKLE FUSION Left 05/25/2013   related to his arthritis   ANKLE SURGERY Bilateral    APPLICATION OF CRANIAL NAVIGATION N/A 04/11/2022   Procedure: APPLICATION OF CRANIAL NAVIGATION;  Surgeon: Lanis Pupa, MD;  Location: [facility] OR;  Service: Neurosurgery;  Laterality: N/A;   APPLICATION OF CRANIAL NAVIGATION N/A 08/08/2022   Procedure: APPLICATION OF CRANIAL NAVIGATION;  Surgeon: Lanis Pupa, MD;  Location: [facility] OR;  Service: Neurosurgery;  Laterality: N/A;   BIOPSY  06/09/2023   Procedure: BIOPSY;  Surgeon: Saintclair Jasper, MD;  Location: [facility];  Service: Gastroenterology;;   CARDIAC CATHETERIZATION N/A 05/01/2015   Procedure: Left Heart Cath and Coronary Angiography;  Surgeon: Victory LELON Sharps, MD;  Location: [facility] INVASIVE CV LAB;  Service: Cardiovascular;  Laterality: N/A;   COLONOSCOPY WITH PROPOFOL  N/A 06/09/2023   Procedure: COLONOSCOPY WITH PROPOFOL ;  Surgeon: Saintclair Jasper, MD;  Location: [facility];  Service: Gastroenterology;  Laterality: N/A;   CRANIOTOMY N/A 04/11/2022   Procedure: STEREOTACTIC SUBOCCIPITAL CRANIOTOMY FOR RESECTION OF ARTERIO-VENOUS MALFORMATION;  Surgeon: Lanis Pupa, MD;  Location: [facility];  Service: Neurosurgery;  Laterality: N/A;   ESOPHAGOGASTRODUODENOSCOPY (EGD) WITH PROPOFOL  N/A 06/09/2023   Procedure: ESOPHAGOGASTRODUODENOSCOPY (EGD) WITH PROPOFOL ;  Surgeon: Saintclair Jasper, MD;  Location: [facility];  Service: Gastroenterology;  Laterality: N/A;   FRACTURE SURGERY     left femur fracture x 3   HEMOSTASIS CLIP PLACEMENT  06/09/2023   Procedure: HEMOSTASIS CLIP PLACEMENT;  Surgeon: Saintclair Jasper, MD;  Location: [facility];  Service: Gastroenterology;;   IR ANGIO EXTERNAL  CAROTID SEL EXT CAROTID BILAT MOD SED  01/28/2022   IR ANGIO INTRA EXTRACRAN SEL INTERNAL CAROTID BILAT MOD SED  01/28/2022   IR ANGIO INTRA EXTRACRAN SEL INTERNAL CAROTID BILAT MOD SED  04/16/2022   IR ANGIO VERTEBRAL SEL VERTEBRAL BILAT MOD SED  01/28/2022   IR ANGIO VERTEBRAL SEL VERTEBRAL BILAT MOD SED  04/16/2022  KNEE ARTHROSCOPY     right x 4   LAPAROSCOPIC REVISION VENTRICULAR-PERITONEAL (V-P) SHUNT N/A 08/08/2022   Procedure: LAPAROSCOPIC VENTRICULAR-PERITONEAL (V-P) SHUNT;  Surgeon: Vanderbilt Ned, MD;  Location: [facility];  Service: General;  Laterality: N/A;   LUMBAR LAMINECTOMY/DECOMPRESSION MICRODISCECTOMY N/A 07/18/2022   Procedure: Repair of Pseudomeningiocele posterior;  Surgeon: Lanis Pupa, MD;  Location: [facility];  Service: Neurosurgery;  Laterality: N/A;   NASAL SEPTOPLASTY W/ TURBINOPLASTY  06/25/2011   Procedure: NASAL SEPTOPLASTY WITH TURBINATE REDUCTION;  Surgeon: Alm Bouche, MD;  Location: [facility] OR;  Service: ENT;  Laterality: Bilateral;   PLACEMENT OF LUMBAR DRAIN N/A 07/18/2022   Procedure: PLACEMENT OF LUMBAR DRAIN;  Surgeon: Lanis Pupa, MD;  Location: [facility];  Service: Neurosurgery;  Laterality: N/A;   POLYPECTOMY  06/09/2023   Procedure: POLYPECTOMY;  Surgeon: Saintclair Jasper, MD;  Location: [facility];  Service: Gastroenterology;;   TONSILLECTOMY     TOTAL KNEE ARTHROPLASTY Right 06/01/2015   Procedure: RIGHT TOTAL KNEE ARTHROPLASTY;  Surgeon: Lonni CINDERELLA Poli, MD;  Location: [facility];  Service: Orthopedics;  Laterality: Right;  Block+general   VENTRICULOPERITONEAL SHUNT Right 08/08/2022   Procedure: LAP ASSITED SHUNT INSERTION VENTRICULAR-PERITONEAL;  Surgeon: Lanis Pupa, MD;  Location: [facility];  Service: Neurosurgery;  Laterality: Right;    Medications: Current Outpatient Medications  Medication Sig Dispense Refill   acetaminophen  (TYLENOL ) 325 MG tablet Take 2 tablets (650 mg total) by mouth 3 (three) times daily.     alendronate (FOSAMAX) 10  MG tablet Take 10 mg by mouth daily before breakfast. Take with a full glass of water  on an empty stomach.     artificial tears (LACRILUBE) OINT ophthalmic ointment Place into both eyes 2 (two) times daily.     aspirin  EC 81 MG tablet Take 1 tablet (81 mg total) by mouth daily. Swallow whole. 30 tablet 12   calcium  carbonate (TUMS - DOSED IN MG ELEMENTAL CALCIUM ) 500 MG chewable tablet Chew 1 tablet (200 mg of elemental calcium  total) by mouth daily with supper.     Cholecalciferol (VITAMIN D3) 50 MCG (2000 UT) TABS Take 2,000 Units by mouth daily.     famotidine (PEPCID) 40 MG tablet 1 tablet Orally Once a day for 30 days     folic acid  (FOLVITE ) 1 MG tablet Take 1 mg by mouth daily.     methotrexate  (RHEUMATREX) 2.5 MG tablet Take 20 mg by mouth once a week. Takes 8 tablets(20mg ) every Saturday.     Caution:Chemotherapy. Protect from light.     metoprolol  succinate (TOPROL -XL) 25 MG 24 hr tablet TAKE 1 TABLET DAILY 90 tablet 3   Multiple Vitamins-Minerals (PRESERVISION AREDS 2 PO) Take 2 capsules by mouth daily.     predniSONE  (STERAPRED UNI-PAK 21 TAB) 5 MG (21) TBPK tablet Take 5 mg by mouth every morning.     RITUXAN  500 MG/50ML injection See admin instructions. Every 6 months 2 dose infusion Ruxience      rosuvastatin  (CRESTOR ) 20 MG tablet Take 1 tablet (20 mg total) by mouth daily. Patient must schedule annual appointment for further refills first attempt 30 tablet 0   sertraline  (ZOLOFT ) 20 MG/ML concentrated solution Take 25 mg by mouth daily.     spironolactone  (ALDACTONE ) 25 MG tablet 12.5 mg. 1/2 tab daily     sulfaSALAzine  (AZULFIDINE ) 500 MG tablet Take 1,000 mg by mouth 2 (two) times daily.     traMADol  (ULTRAM ) 50 MG tablet Take 1 tablet (50 mg total) by mouth every 12 (twelve) hours as  needed for moderate pain. (Patient taking differently: Take 50 mg by mouth as needed for moderate pain (pain score 4-6).) 60 tablet 2   traZODone  (DESYREL ) 50 MG tablet TAKE 1.5 TABLETS (75 MG TOTAL)  BY MOUTH AT BEDTIME AS NEEDED. FOR SLEEP 135 tablet 5   White Petrolatum -Mineral Oil (GENTEAL TEARS NIGHT-TIME OP) Apply to eye at bedtime.     No current facility-administered medications for this visit.    Allergies  Allergen Reactions   Hydroxychloroquine      Other Reaction(s): cardiolmyopathy    Diagnoses:  Major depression  Plan of Care: Outpatient psychotherapy and medication management as needed. Initial Note: (With wife Christopher Yu). She says that she has hoped Maisen would get into therapy. He has Ataxic Tremor. He told Dr. Evonnie that he would like medication to just sit and do nothing. He says he always had golf to rely on and now cannot play. In December, he went n for a surgery to remove an avm from brain. Was told it was a routine surgery and was told that he needs to get it out. Was told it would be a short recovery and he would be back to work. 1 1/2 hour surgery turned into 6 hour surgery and he had complications. He had extra cerebral fluid and Dr. said it would absorb and just wait. He kept declining and by March, they went in to try and drain the extra fluid. He was in hospital for a week. Wife noticed that during the hospital stay, his speech declined and he was clearly weak. Nurses were trying to discharge him and she protested, but they sent him home anyway. He declined, and could not sit, stand, talk or walk and she ended up calling ambulance. In ER, she ws told he had a stroke. He was in ER 24 hours before getting a room in Neuro area. Head of stroke team came in and said it was not a stroke, but complications from surgery. She was blamed by medical team for not speaking up before being initially discharged. He ended up in a rehab for 2 weeks and told them he needed another surgery. Was told he had hydrocephalus and need a shunt to drain liquid off his brain. She felt a lot of pressure from team that he needed surgery. She wanted a second opinion and had rouble getting it. Spoke to  another Dr. and they decided to get the third surgery. He was in OT,PT and Speech Therapy. During therapy, he started having a tremor. They ended up being referred to Dr. Evonnie, who has now told him that treating his tremor is tricky and will take time.  Karder is no longer in OT,PT speech therapy. He is supposed to be doing exercises on his own, but has not been emotionally able to ge himself to do it. He has persistent headaches as well. Just had a CAT Scan and awaiting the results. His headaches are worse and they are concerned. He also has rhumatory arthritis and it is flaring up because he had to stop meds. He has cardiomyopathy, macular degeneration on top of all of these other conditions.  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 He has been approved for disability and are working to get social security disability. She works part time. They have 2 daughters, one with diabetes and arthritis. She is heading to [employer] this Fall and wants to work in Research scientist (physical sciences).  Other daughter is 60 and is our introvert with lots of anxiety. She is in therapy and has mental health issues that is being treated. She will be a Holiday representative at the middle college at [employer].  They are told that medicine will not control his condition, but they will have to try to retrain his brain. Suggested that they work to get back into OT and PT. She has a hard time motivating him. He admits that he keeps things inside. She says he is not a good comunicator. He will not share things like if he is in pain or hurts himself.   Tx. Plan/Goals: Patient reports depressive symptoms secondary to the medical trauma he has suffered and the debilitating physical results. He is seeking therapy to resolve his feelings of helplessness and hopelessness. Also want assistance in improving his drive/motivation in daily activity. Will engage in therapy that utilizes behavioral approaches to  mitigate symptoms along with insight oriented counseling. Will also incorporate some conjoint sessions with spouse as needed, given her integral involvement with his care. Patient agrees with plan for treatment. Goal Date is 12-25    Patient agrees to be seen in video (caregility) session and is aware of the limitations of this platform. He is at home and provider in home office. Session Note:  Patient reports that he had a good weekend. We talked about his inability to drive, which he claims is because his wife is not comfortable. Says the doctors are okay with him driving. He says he will not subject himself to a driving test to prove he can drive. He says I am not a dog and pony show and says he refuses to do things at their whim. He has very strong negative feelings about DMV. Says he failed his first try to get license as a kid and they failed everyone at their first try. Then says his 66 year old grandmother had to pass a driving test every year, but he would not get into a car with her. Therefore, he doesn't trust their evaluations. He thinks that all the people who would work at [employer] at [facility]  and he doesn't want  to take their word for it or trust them. Will consider having a talk with his wife about driving with him to help her feel more confident with his driving.                                                                                                                    CONI ALM KERNS, PhD 12:20p-1:10p 50 minutes

## 2023-12-15 ENCOUNTER — Ambulatory Visit (INDEPENDENT_AMBULATORY_CARE_PROVIDER_SITE_OTHER): Admitting: Psychology

## 2023-12-15 DIAGNOSIS — F329 Major depressive disorder, single episode, unspecified: Secondary | ICD-10-CM

## 2023-12-15 DIAGNOSIS — F321 Major depressive disorder, single episode, moderate: Secondary | ICD-10-CM

## 2023-12-15 NOTE — Progress Notes (Signed)
 [facility] Counselor Initial Adult Exam  Name: Christopher Yu Date: 12/15/2023 MRN: [id number] DOB: 06/05/1960 PCP: Dwight Trula SQUIBB, MD    Guardian/Payee:  N/A    Paperwork requested: No   Reason for Visit [name] Problem: Adjustment to serious medical illness  Mental Status Exam: Appearance:   Casual     Behavior:  Appropriate  Motor:  Shuffling Gait  Speech/Language:   Slurred  Affect:  Depressed  Mood:  depressed  Thought process:  normal  Thought content:    WNL  Sensory/Perceptual disturbances:    WNL  Orientation:  oriented to person, place, and situation  Attention:  Good  Concentration:  Good  Memory:  WNL  Fund of knowledge:   Good  Insight:    Good  Judgment:   unknown  Impulse Control:  unknown    Reported Symptoms:  Sadness, helplessness and hopelessness  Risk Assessment: Danger to Self:  No Self-injurious Behavior: No Danger to Others: No Duty to Warn:no Physical Aggression / Violence:No  Access to Firearms a concern: Unknown Gang Involvement:No  Patient / guardian was educated about steps to take if suicide or homicide risk level increases between visits: n/a While future psychiatric events cannot be accurately predicted, the patient does not currently require acute inpatient psychiatric care and does not currently meet [place]  involuntary commitment criteria.  Substance Abuse History: Current substance abuse: No     Past Psychiatric  History:   No previous psychological problems have been observed Outpatient Providers:N/A History of Psych Hospitalization: No  Psychological Testing: none   Abuse History:  Victim of:  N/A  Report needed: No. Victim of Neglect:No. Perpetrator of N/A  Witness / Exposure to Domestic Violence: No   Protective Services Involvement: No  Witness to [employer] Violence:  No   Family History:  Family History  Problem Relation Age of Onset   Other Father        MVA   Lupus Sister     Living situation: the patient lives with their family  Sexual Orientation: Straight  Relationship Status: married  Name of spouse Christopher Yu If a parent, number of children / ages:Two girls, ages 42 and 34  Support Systems: spouse  Surveyor, quantity Stress:  Yes   Income/Employment/Disability: Software engineer: unknown  Educational History: Education: unknown  Oncologist: unknown  Any cultural differences that may affect / interfere with treatment:  not applicable   Recreation/Hobbies: golfing  Stressors: Financial difficulties   Health problems    Strengths: Family  Barriers:  severe physical limitations that compromises activity   Legal History: Pending legal issue / charges: The patient has no significant history of legal issues. History of legal issue / charges: N/A  Medical History/Surgical History: reviewed Past Medical History:  Diagnosis Date   Anxiety    Arthritis, rheumatoid (HCC)    dx 1988   CAD (coronary artery disease), native coronary artery    Mild LAD disease with calcification noted at cath 12//27/16    Cardiomyopathy (HCC)    Cardiomyopathy (HCC)    Chronic systolic heart failure (HCC) 05/01/2015   Depression    Deviated nasal septum 06/25/2011   Headache    High cholesterol    Hyperlipidemia    Impingement syndrome of left shoulder 12/22/2017   Macular degeneration    Nausea and vomiting 08/28/2022   Rheumatoid aortitis     Sleep apnea    does not use cpap - UNABLE TO TOLERATE MASK   Sleep apnea in adult    deviated septum repaired, most recent sleep study was negative   Status post total right knee replacement 06/01/2015    Past Surgical History:  Procedure Laterality Date   ANKLE FUSION Right 05/05/2012   related to his arthritis   ANKLE FUSION Left 05/25/2013   related to his arthritis   ANKLE SURGERY Bilateral    APPLICATION OF CRANIAL NAVIGATION N/A 04/11/2022   Procedure: APPLICATION OF CRANIAL NAVIGATION;  Surgeon: Lanis Pupa, MD;  Location: [facility];  Service: Neurosurgery;  Laterality: N/A;   APPLICATION OF CRANIAL NAVIGATION N/A 08/08/2022   Procedure: APPLICATION OF CRANIAL NAVIGATION;  Surgeon: Lanis Pupa, MD;  Location: [facility] OR;  Service: Neurosurgery;  Laterality: N/A;   BIOPSY  06/09/2023   Procedure: BIOPSY;  Surgeon: Saintclair Jasper, MD;  Location: [facility];  Service: Gastroenterology;;   CARDIAC CATHETERIZATION N/A 05/01/2015   Procedure: Left Heart Cath and Coronary Angiography;  Surgeon: Victory LELON Sharps, MD;  Location: [facility] INVASIVE CV LAB;  Service: Cardiovascular;  Laterality: N/A;   COLONOSCOPY WITH PROPOFOL  N/A 06/09/2023   Procedure: COLONOSCOPY WITH PROPOFOL ;  Surgeon: Saintclair Jasper, MD;  Location: [facility];  Service: Gastroenterology;  Laterality: N/A;   CRANIOTOMY N/A 04/11/2022   Procedure: STEREOTACTIC SUBOCCIPITAL CRANIOTOMY FOR RESECTION OF ARTERIO-VENOUS MALFORMATION;  Surgeon: Lanis Pupa, MD;  Location: [facility];  Service: Neurosurgery;  Laterality: N/A;   ESOPHAGOGASTRODUODENOSCOPY (EGD) WITH PROPOFOL  N/A 06/09/2023   Procedure: ESOPHAGOGASTRODUODENOSCOPY (EGD) WITH PROPOFOL ;  Surgeon: Saintclair Jasper, MD;  Location: [facility];  Service: Gastroenterology;  Laterality: N/A;   FRACTURE SURGERY     left femur fracture x 3   HEMOSTASIS CLIP PLACEMENT  06/09/2023   Procedure: HEMOSTASIS CLIP PLACEMENT;  Surgeon: Saintclair Jasper, MD;  Location: [facility];  Service:  Gastroenterology;;   IR ANGIO EXTERNAL CAROTID SEL EXT CAROTID BILAT MOD SED  01/28/2022   IR ANGIO INTRA EXTRACRAN SEL INTERNAL CAROTID BILAT MOD SED  01/28/2022   IR ANGIO INTRA EXTRACRAN SEL INTERNAL CAROTID BILAT MOD SED  04/16/2022   IR ANGIO VERTEBRAL SEL VERTEBRAL BILAT MOD SED  01/28/2022   IR ANGIO VERTEBRAL SEL VERTEBRAL BILAT MOD SED  04/16/2022   KNEE ARTHROSCOPY     right x 4   LAPAROSCOPIC REVISION VENTRICULAR-PERITONEAL (V-P) SHUNT N/A 08/08/2022   Procedure: LAPAROSCOPIC VENTRICULAR-PERITONEAL (V-P) SHUNT;  Surgeon: Vanderbilt Ned, MD;  [facility]: [facility];  Service: General;  Laterality: N/A;   LUMBAR LAMINECTOMY/DECOMPRESSION MICRODISCECTOMY N/A 07/18/2022   Procedure: Repair of Pseudomeningiocele posterior;  Surgeon: Lanis Pupa, MD;  [facility]: [facility];  Service: Neurosurgery;  Laterality: N/A;   NASAL SEPTOPLASTY W/ TURBINOPLASTY  06/25/2011   Procedure: NASAL SEPTOPLASTY WITH TURBINATE REDUCTION;  Surgeon: Alm Bouche, MD;  Location: [facility] OR;  Service: ENT;  Laterality: Bilateral;   PLACEMENT OF LUMBAR DRAIN N/A 07/18/2022   Procedure: PLACEMENT OF LUMBAR DRAIN;  Surgeon: Lanis Pupa, MD;  [facility]: [facility];  Service: Neurosurgery;  Laterality: N/A;   POLYPECTOMY  06/09/2023   Procedure: POLYPECTOMY;  Surgeon: Saintclair Jasper, MD;  Location: [facility];  Service: Gastroenterology;;   TONSILLECTOMY     TOTAL KNEE ARTHROPLASTY Right 06/01/2015   Procedure: RIGHT TOTAL KNEE ARTHROPLASTY;  Surgeon: Lonni CINDERELLA Poli, MD;  Location: [facility];  Service: Orthopedics;  Laterality: Right;  Block+general   VENTRICULOPERITONEAL SHUNT Right 08/08/2022   Procedure: LAP ASSITED SHUNT INSERTION VENTRICULAR-PERITONEAL;  Surgeon: Lanis Pupa, MD;  Location: [facility];  Service: Neurosurgery;  Laterality: Right;    Medications: Current Outpatient Medications  Medication Sig Dispense Refill   acetaminophen  (TYLENOL ) 325 MG tablet Take 2 tablets (650 mg total) by mouth 3 (three)  times daily.     alendronate (FOSAMAX) 10 MG tablet Take 10 mg by mouth daily before breakfast. Take with a full glass of water  on an empty stomach.     artificial tears (LACRILUBE) OINT ophthalmic ointment Place into both eyes 2 (two) times daily.     aspirin  EC 81 MG tablet Take 1 tablet (81 mg total) by mouth daily. Swallow whole. 30 tablet 12   calcium  carbonate (TUMS - DOSED IN MG ELEMENTAL CALCIUM ) 500 MG chewable tablet Chew 1 tablet (200 mg of elemental calcium  total) by mouth daily with supper.     Cholecalciferol (VITAMIN D3) 50 MCG (2000 UT) TABS Take 2,000 Units by mouth daily.     famotidine (PEPCID) 40 MG tablet 1 tablet Orally Once a day for 30 days     folic acid  (FOLVITE ) 1 MG tablet Take 1 mg by mouth daily.     methotrexate  (RHEUMATREX) 2.5 MG tablet Take 20 mg by mouth once a week. Takes 8 tablets(20mg ) every Saturday.     Caution:Chemotherapy. Protect from light.     metoprolol  succinate (TOPROL -XL) 25 MG 24 hr tablet TAKE 1 TABLET DAILY 90 tablet 3   Multiple Vitamins-Minerals (PRESERVISION AREDS 2 PO) Take 2 capsules by mouth daily.     predniSONE  (STERAPRED UNI-PAK 21 TAB) 5 MG (21) TBPK tablet Take 5 mg by mouth every morning.     RITUXAN  500 MG/50ML injection See admin instructions. Every 6 months 2 dose infusion Ruxience      rosuvastatin  (CRESTOR ) 20 MG tablet Take 1 tablet (20 mg total) by mouth daily. Patient must schedule annual appointment for further refills first attempt 30 tablet 0   sertraline  (ZOLOFT ) 20 MG/ML concentrated solution Take 25 mg by mouth daily.     spironolactone  (ALDACTONE ) 25 MG tablet 12.5 mg. 1/2 tab daily     sulfaSALAzine  (AZULFIDINE ) 500 MG tablet Take 1,000 mg by mouth 2 (two) times daily.     traMADol  (ULTRAM ) 50  MG tablet Take 1 tablet (50 mg total) by mouth every 12 (twelve) hours as needed for moderate pain. (Patient taking differently: Take 50 mg by mouth as needed for moderate pain (pain score 4-6).) 60 tablet 2   traZODone  (DESYREL )  50 MG tablet TAKE 1.5 TABLETS (75 MG TOTAL) BY MOUTH AT BEDTIME AS NEEDED. FOR SLEEP 135 tablet 5   White Petrolatum -Mineral Oil (GENTEAL TEARS NIGHT-TIME OP) Apply to eye at bedtime.     No current facility-administered medications for this visit.    Allergies  Allergen Reactions   Hydroxychloroquine      Other Reaction(s): cardiolmyopathy    Diagnoses:  Major depression  Plan of Care: Outpatient psychotherapy and medication management as needed. Initial Note: (With wife Christopher Yu). She says that she has hoped Tres would get into therapy. He has Ataxic Tremor. He told Dr. Evonnie that he would like medication to just sit and do nothing. He says he always had golf to rely on and now cannot play. In December, he went n for a surgery to remove an avm from brain. Was told it was a routine surgery and was told that he needs to get it out. Was told it would be a short recovery and he would be back to work. 1 1/2 hour surgery turned into 6 hour surgery and he had complications. He had extra cerebral fluid and Dr. said it would absorb and just wait. He kept declining and by March, they went in to try and drain the extra fluid. He was in hospital for a week. Wife noticed that during the hospital stay, his speech declined and he was clearly weak. Nurses were trying to discharge him and she protested, but they sent him home anyway. He declined, and could not sit, stand, talk or walk and she ended up calling ambulance. In ER, she ws told he had a stroke. He was in ER 24 hours before getting a room in Neuro area. Head of stroke team came in and said it was not a stroke, but complications from surgery. She was blamed by medical team for not speaking up before being initially discharged. He ended up in a rehab for 2 weeks and told them he needed another surgery. Was told he had hydrocephalus and need a shunt to drain liquid off his brain. She felt a lot of pressure from team that he needed surgery. She wanted a second  opinion and had rouble getting it. Spoke to another Dr. and they decided to get the third surgery. He was in OT,PT and Speech Therapy. During therapy, he started having a tremor. They ended up being referred to Dr. Evonnie, who has now told him that treating his tremor is tricky and will take time.  Mourad is no longer in OT,PT speech therapy. He is supposed to be doing exercises on his own, but has not been emotionally able to ge himself to do it. He has persistent headaches as well. Just had a CAT Scan and awaiting the results. His headaches are worse and they are concerned. He also has rhumatory arthritis and it is flaring up because he had to stop meds. He has cardiomyopathy, macular degeneration on top of all of these other conditions.  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 He has been approved for disability and are working to get social security disability. She works part time. They have 2 daughters, one with diabetes  and arthritis. She is heading to [employer] this Fall and wants to work in Research scientist (physical sciences). Other daughter is 52 and is our introvert with lots of anxiety. She is in therapy and has mental health issues that is being treated. She will be a Holiday representative at the middle college at [employer].  They are told that medicine will not control his condition, but they will have to try to retrain his brain. Suggested that they work to get back into OT and PT. She has a hard time motivating him. He admits that he keeps things inside. She says he is not a good comunicator. He will not share things like if he is in pain or hurts himself.   Tx. Plan/Goals: Patient reports depressive symptoms secondary to the medical trauma he has suffered and the debilitating physical results. He is seeking therapy to resolve his feelings of helplessness and hopelessness. Also want assistance in improving his drive/motivation in daily activity. Will engage in therapy  that utilizes behavioral approaches to mitigate symptoms along with insight oriented counseling. Will also incorporate some conjoint sessions with spouse as needed, given her integral involvement with his care. Patient agrees with plan for treatment. Goal Date is 12-25    Patient agrees to be seen in video (caregility) session and is aware of the limitations of this platform. He is at home and provider in home office. Session Note:  Patient reports he has called the PT office to see if they can can help him with balance and are available when he can make it. He is also trying to get his HVAC fixed and is awaiting a written estimate. He also reached out to Dr. Evonnie to find out if she wants to have a follow-up with him. He says that he heard through third party from Dr. Evonnie that medication does not work well with his condition. Part of him would like to hear directly from Dr. Evonnie about the potential use of medication. He did not talk with wife about driving, but says I should. He is talking to wife about visiting daughter at [facility]. She has been reluctant because of the difficulty traveling for [place]. She now is saying she will be willing to travel with him.                                                                                                                      CONI ALM KERNS, PhD 2:15p-3:00p 45 minutes

## 2023-12-17 DIAGNOSIS — G4733 Obstructive sleep apnea (adult) (pediatric): Secondary | ICD-10-CM | POA: Diagnosis not present

## 2023-12-21 ENCOUNTER — Encounter: Payer: Self-pay | Admitting: Speech Pathology

## 2023-12-21 DIAGNOSIS — M0579 Rheumatoid arthritis with rheumatoid factor of multiple sites without organ or systems involvement: Secondary | ICD-10-CM | POA: Diagnosis not present

## 2023-12-22 ENCOUNTER — Ambulatory Visit: Payer: Self-pay | Admitting: Speech Pathology

## 2023-12-23 ENCOUNTER — Ambulatory Visit (INDEPENDENT_AMBULATORY_CARE_PROVIDER_SITE_OTHER): Admitting: Psychology

## 2023-12-23 DIAGNOSIS — F321 Major depressive disorder, single episode, moderate: Secondary | ICD-10-CM

## 2023-12-23 DIAGNOSIS — F329 Major depressive disorder, single episode, unspecified: Secondary | ICD-10-CM | POA: Diagnosis not present

## 2023-12-23 NOTE — Progress Notes (Signed)
 Christopher Yu Initial Adult Exam  Name: Christopher Yu Date: 12/23/2023 MRN: [id number] DOB: 04/04/1961 PCP: Dwight Trula SQUIBB, MD    Guardian/Payee:  N/A    Paperwork requested: No   Reason for Visit [name] Problem: Adjustment to serious medical illness  Mental Status Exam: Appearance:   Casual     Behavior:  Appropriate  Motor:  Shuffling Gait  Speech/Language:   Slurred  Affect:  Depressed  Mood:  depressed  Thought process:  normal  Thought content:    WNL  Sensory/Perceptual disturbances:    WNL  Orientation:  oriented to person, place, and situation  Attention:  Good  Concentration:  Good  Memory:  WNL  Fund of knowledge:   Good  Insight:    Good  Judgment:   unknown  Impulse Control:  unknown    Reported Symptoms:  Sadness, helplessness and hopelessness  Risk Assessment: Danger to Self:  No Self-injurious Behavior: No Danger to Others: No Duty to Warn:no Physical Aggression / Violence:No  Access to Firearms a concern: Unknown Gang Involvement:No  Patient / guardian was educated about steps to take if suicide or homicide risk level increases between visits: n/a While future psychiatric events cannot be accurately predicted, the patient does not currently require acute inpatient psychiatric care and does not currently meet [place]  involuntary commitment criteria.  Substance Abuse History: Current substance abuse: No      Past Psychiatric History:   No previous psychological problems have been observed Outpatient Providers:N/A History of Psych Hospitalization: No  Psychological Testing: none   Abuse History:  Victim of:  N/A  Report needed: No. Victim of Neglect:No. Perpetrator of N/A  Witness / Exposure to Domestic Violence: No   Protective Services Involvement: No  Witness to [employer] Violence:  No   Family History:  Family History  Problem Relation Age of Onset   Other Father        MVA   Lupus Sister     Living  situation: the patient lives with their family  Sexual Orientation: Straight  Relationship Status: married  Name of spouse Christopher Yu If a parent, number of children / ages:Two girls, ages 42 and 95  Support Systems: spouse  Surveyor, quantity Stress:  Yes   Income/Employment/Disability: Software engineer: unknown  Educational History: Education: unknown  Oncologist: unknown  Any cultural differences that may affect / interfere with treatment:  not applicable   Recreation/Hobbies: golfing  Stressors: Financial difficulties   Health problems    Strengths: Family  Barriers:  severe physical limitations that compromises activity   Legal History: Pending legal issue / charges: The patient has no significant history of legal issues. History of legal issue / charges: N/A  Medical History/Surgical History: reviewed Past Medical History:  Diagnosis Date   Anxiety    Arthritis, rheumatoid (HCC)    dx 1988   CAD (coronary artery disease), native coronary artery    Mild LAD disease with calcification noted at cath 12//27/16    Cardiomyopathy (HCC)    Cardiomyopathy (HCC)    Chronic systolic heart failure (HCC) 05/01/2015   Depression    Deviated nasal septum 06/25/2011   Headache    High cholesterol    Hyperlipidemia    Impingement syndrome of left shoulder 12/22/2017   Macular degeneration    Nausea and vomiting 08/28/2022    Rheumatoid aortitis    Sleep apnea    does not use cpap - UNABLE TO TOLERATE MASK   Sleep apnea in adult    deviated septum repaired, most recent sleep study was negative   Status post total right knee replacement 06/01/2015    Past Surgical History:  Procedure Laterality Date   ANKLE FUSION Right 05/05/2012   related to his arthritis   ANKLE FUSION Left 05/25/2013   related to his arthritis   ANKLE SURGERY Bilateral    APPLICATION OF CRANIAL NAVIGATION N/A 04/11/2022   Procedure: APPLICATION OF CRANIAL NAVIGATION;  Surgeon: Lanis Pupa, MD;  Location: [facility] OR;  Service: Neurosurgery;  Laterality: N/A;   APPLICATION OF CRANIAL NAVIGATION N/A 08/08/2022   Procedure: APPLICATION OF CRANIAL NAVIGATION;  Surgeon: Lanis Pupa, MD;  Location: [facility] OR;  Service: Neurosurgery;  Laterality: N/A;   BIOPSY  06/09/2023   Procedure: BIOPSY;  Surgeon: Saintclair Jasper, MD;  Location: [facility];  Service: Gastroenterology;;   CARDIAC CATHETERIZATION N/A 05/01/2015   Procedure: Left Heart Cath and Coronary Angiography;  Surgeon: Victory LELON Sharps, MD;  Location: [facility] INVASIVE CV LAB;  Service: Cardiovascular;  Laterality: N/A;   COLONOSCOPY WITH PROPOFOL  N/A 06/09/2023   Procedure: COLONOSCOPY WITH PROPOFOL ;  Surgeon: Saintclair Jasper, MD;  Location: [facility];  Service: Gastroenterology;  Laterality: N/A;   CRANIOTOMY N/A 04/11/2022   Procedure: STEREOTACTIC SUBOCCIPITAL CRANIOTOMY FOR RESECTION OF ARTERIO-VENOUS MALFORMATION;  Surgeon: Lanis Pupa, MD;  Location: [facility];  Service: Neurosurgery;  Laterality: N/A;   ESOPHAGOGASTRODUODENOSCOPY (EGD) WITH PROPOFOL  N/A 06/09/2023   Procedure: ESOPHAGOGASTRODUODENOSCOPY (EGD) WITH PROPOFOL ;  Surgeon: Saintclair Jasper, MD;  Location: [facility];  Service: Gastroenterology;  Laterality: N/A;   FRACTURE SURGERY     left femur fracture x 3   HEMOSTASIS CLIP PLACEMENT  06/09/2023   Procedure: HEMOSTASIS CLIP PLACEMENT;  Surgeon: Saintclair Jasper, MD;  Location: [facility];  Service: Gastroenterology;;   IR ANGIO EXTERNAL CAROTID SEL EXT CAROTID BILAT MOD SED  01/28/2022   IR ANGIO INTRA EXTRACRAN SEL INTERNAL CAROTID BILAT MOD SED  01/28/2022   IR ANGIO INTRA EXTRACRAN SEL INTERNAL CAROTID  BILAT MOD SED  04/16/2022   IR ANGIO VERTEBRAL SEL VERTEBRAL BILAT MOD SED  01/28/2022   IR ANGIO VERTEBRAL SEL VERTEBRAL BILAT MOD SED  04/16/2022   KNEE ARTHROSCOPY     right x 4   LAPAROSCOPIC REVISION VENTRICULAR-PERITONEAL (V-P) SHUNT N/A 08/08/2022   Procedure: LAPAROSCOPIC VENTRICULAR-PERITONEAL (V-P) SHUNT;  Surgeon: Vanderbilt Ned, MD;  Location: [facility];  Service: General;  Laterality: N/A;   LUMBAR LAMINECTOMY/DECOMPRESSION MICRODISCECTOMY N/A 07/18/2022   Procedure: Repair of Pseudomeningiocele posterior;  Surgeon: Lanis Pupa, MD;  Location: [facility];  Service: Neurosurgery;  Laterality: N/A;   NASAL SEPTOPLASTY W/ TURBINOPLASTY  06/25/2011   Procedure: NASAL SEPTOPLASTY WITH TURBINATE REDUCTION;  Surgeon: Alm Bouche, MD;  [facility];  Service: ENT;  Laterality: Bilateral;   PLACEMENT OF LUMBAR DRAIN N/A 07/18/2022   Procedure: PLACEMENT OF LUMBAR DRAIN;  Surgeon: Lanis Pupa, MD;  Location: [facility];  Service: Neurosurgery;  Laterality: N/A;   POLYPECTOMY  06/09/2023   Procedure: POLYPECTOMY;  Surgeon: Saintclair Jasper, MD;  Location: [facility];  Service: Gastroenterology;;   TONSILLECTOMY     TOTAL KNEE ARTHROPLASTY Right 06/01/2015   Procedure: RIGHT TOTAL KNEE ARTHROPLASTY;  Surgeon: Lonni CINDERELLA Poli, MD;  Location: [facility];  Service: Orthopedics;  Laterality: Right;  Block+general   VENTRICULOPERITONEAL SHUNT Right 08/08/2022   Procedure: LAP ASSITED SHUNT INSERTION VENTRICULAR-PERITONEAL;  Surgeon: Lanis Pupa, MD;  Location: [facility];  Service: Neurosurgery;  Laterality: Right;    Medications: Current Outpatient Medications  Medication Sig Dispense Refill   acetaminophen  (TYLENOL ) 325 MG tablet Take 2 tablets (650 mg total)  by mouth 3 (three) times daily.     alendronate (FOSAMAX) 10 MG tablet Take 10 mg by mouth daily before breakfast. Take with a full glass of water  on an empty stomach.     artificial tears (LACRILUBE) OINT ophthalmic ointment Place into both eyes 2 (two) times daily.     aspirin  EC 81 MG tablet Take 1 tablet (81 mg total) by mouth daily. Swallow whole. 30 tablet 12   calcium  carbonate (TUMS - DOSED IN MG ELEMENTAL CALCIUM ) 500 MG chewable tablet Chew 1 tablet (200 mg of elemental calcium  total) by mouth daily with supper.     Cholecalciferol (VITAMIN D3) 50 MCG (2000 UT) TABS Take 2,000 Units by mouth daily.     famotidine (PEPCID) 40 MG tablet 1 tablet Orally Once a day for 30 days     folic acid  (FOLVITE ) 1 MG tablet Take 1 mg by mouth daily.     methotrexate  (RHEUMATREX) 2.5 MG tablet Take 20 mg by mouth once a week. Takes 8 tablets(20mg ) every Saturday.     Caution:Chemotherapy. Protect from light.     metoprolol  succinate (TOPROL -XL) 25 MG 24 hr tablet TAKE 1 TABLET DAILY 90 tablet 3   Multiple Vitamins-Minerals (PRESERVISION AREDS 2 PO) Take 2 capsules by mouth daily.     predniSONE  (STERAPRED UNI-PAK 21 TAB) 5 MG (21) TBPK tablet Take 5 mg by mouth every morning.     RITUXAN  500 MG/50ML injection See admin instructions. Every 6 months 2 dose infusion Ruxience      rosuvastatin  (CRESTOR ) 20 MG tablet Take 1 tablet (20 mg total) by mouth daily. Patient must schedule annual appointment for further refills first attempt 30 tablet 0   sertraline  (ZOLOFT ) 20 MG/ML concentrated solution Take 25 mg by mouth daily.     spironolactone  (ALDACTONE ) 25 MG tablet 12.5 mg. 1/2 tab daily     sulfaSALAzine  (AZULFIDINE ) 500 MG tablet  Take 1,000 mg by mouth 2 (two) times daily.     traMADol  (ULTRAM ) 50 MG tablet Take 1 tablet (50 mg total) by mouth every 12 (twelve) hours as needed for moderate pain. (Patient taking differently: Take 50 mg by mouth as needed for moderate pain (pain score 4-6).) 60 tablet 2    traZODone  (DESYREL ) 50 MG tablet TAKE 1.5 TABLETS (75 MG TOTAL) BY MOUTH AT BEDTIME AS NEEDED. FOR SLEEP 135 tablet 5   White Petrolatum -Mineral Oil (GENTEAL TEARS NIGHT-TIME OP) Apply to eye at bedtime.     No current facility-administered medications for this visit.    Allergies  Allergen Reactions   Hydroxychloroquine      Other Reaction(s): cardiolmyopathy    Diagnoses:  Major depression  Plan of Care: Outpatient psychotherapy and medication management as needed. Initial Note: (With wife Christopher Yu). She says that she has hoped Majesty would get into therapy. He has Ataxic Tremor. He told Dr. Evonnie that he would like medication to just sit and do nothing. He says he always had golf to rely on and now cannot play. In December, he went n for a surgery to remove an avm from brain. Was told it was a routine surgery and was told that he needs to get it out. Was told it would be a short recovery and he would be back to work. 1 1/2 hour surgery turned into 6 hour surgery and he had complications. He had extra cerebral fluid and Dr. said it would absorb and just wait. He kept declining and by March, they went in to try and drain the extra fluid. He was in hospital for a week. Wife noticed that during the hospital stay, his speech declined and he was clearly weak. Nurses were trying to discharge him and she protested, but they sent him home anyway. He declined, and could not sit, stand, talk or walk and she ended up calling ambulance. In ER, she ws told he had a stroke. He was in ER 24 hours before getting a room in Neuro area. Head of stroke team came in and said it was not a stroke, but complications from surgery. She was blamed by medical team for not speaking up before being initially discharged. He ended up in a rehab for 2 weeks and told them he needed another surgery. Was told he had hydrocephalus and need a shunt to drain liquid off his brain. She felt a lot of pressure from team that he needed surgery.  She wanted a second opinion and had rouble getting it. Spoke to another Dr. and they decided to get the third surgery. He was in OT,PT and Speech Therapy. During therapy, he started having a tremor. They ended up being referred to Dr. Evonnie, who has now told him that treating his tremor is tricky and will take time.  Xavier is no longer in OT,PT speech therapy. He is supposed to be doing exercises on his own, but has not been emotionally able to ge himself to do it. He has persistent headaches as well. Just had a CAT Scan and awaiting the results. His headaches are worse and they are concerned. He also has rhumatory arthritis and it is flaring up because he had to stop meds. He has cardiomyopathy, macular degeneration on top of all of these other conditions.  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 He has been approved for disability and are working  to get social security disability. She works part time. They have 2 daughters, one with diabetes and arthritis. She is heading to [employer] this Fall and wants to work in Research scientist (physical sciences). Other daughter is 23 and is our introvert with lots of anxiety. She is in therapy and has mental health issues that is being treated. She will be a Holiday representative at the middle college at [employer].  They are told that medicine will not control his condition, but they will have to try to retrain his brain. Suggested that they work to get back into OT and PT. She has a hard time motivating him. He admits that he keeps things inside. She says he is not a good comunicator. He will not share things like if he is in pain or hurts himself.   Tx. Plan/Goals: Patient reports depressive symptoms secondary to the medical trauma he has suffered and the debilitating physical results. He is seeking therapy to resolve his feelings of helplessness and hopelessness. Also want assistance in improving his drive/motivation in daily activity.  Will engage in therapy that utilizes behavioral approaches to mitigate symptoms along with insight oriented counseling. Will also incorporate some conjoint sessions with spouse as needed, given her integral involvement with his care. Patient agrees with plan for treatment. Goal Date is 12-25    Patient agrees to be seen in video (caregility) session and is aware of the limitations of this platform. He is at home and provider in home office. Session Note:  Patient states that he had a good week. He is feeling pretty good and says he is pleased about how he is managing time over summer. He states that he has not tried to golf because of an inability to overcome his balance issues. He is scheduled to see a PT on Sept. 8th for an assessment. He is hopeful that once balance issue address, he can get back on course. Feels he can overcome everything else, but the balance and turning prohibits him from being able to play.  Has last infusion on the third. He had his first one on Monday. Got home and slept.                                                                                                                           CONI ALM KERNS, PhD 4:10p-5:00p 50 minutes

## 2023-12-29 ENCOUNTER — Encounter: Payer: Self-pay | Admitting: Speech Pathology

## 2023-12-29 ENCOUNTER — Ambulatory Visit (INDEPENDENT_AMBULATORY_CARE_PROVIDER_SITE_OTHER): Admitting: Psychology

## 2023-12-29 DIAGNOSIS — F329 Major depressive disorder, single episode, unspecified: Secondary | ICD-10-CM

## 2023-12-29 DIAGNOSIS — F321 Major depressive disorder, single episode, moderate: Secondary | ICD-10-CM

## 2023-12-29 NOTE — Progress Notes (Signed)
 [facility] Counselor Initial Adult Exam  Name: Christopher Yu Date: 12/29/2023 MRN: [id number] DOB: Feb 26, 1961 PCP: Dwight Trula SQUIBB, MD    Guardian/Payee:  N/A    Paperwork requested: No   Reason for Visit [name] Problem: Adjustment to serious medical illness  Mental Status Exam: Appearance:   Casual     Behavior:  Appropriate  Motor:  Shuffling Gait  Speech/Language:   Slurred  Affect:  Depressed  Mood:  depressed  Thought process:  normal  Thought content:    WNL  Sensory/Perceptual disturbances:    WNL  Orientation:  oriented to person, place, and situation  Attention:  Good  Concentration:  Good  Memory:  WNL  Fund of knowledge:   Good  Insight:    Good  Judgment:   unknown  Impulse Control:  unknown    Reported Symptoms:  Sadness, helplessness and hopelessness  Risk Assessment: Danger to Self:  No Self-injurious Behavior: No Danger to Others: No Duty to Warn:no Physical Aggression / Violence:No  Access to Firearms a concern: Unknown Gang Involvement:No  Patient / guardian was educated about steps to take if suicide or homicide risk level increases between visits: n/a While future psychiatric events cannot be accurately predicted, the patient does not currently require acute inpatient psychiatric care and does not currently meet [facility]  involuntary commitment criteria.  Substance Abuse History: Current substance abuse: No      Past Psychiatric History:   No previous psychological problems have been observed Outpatient Providers:N/A History of Psych Hospitalization: No  Psychological Testing: none   Abuse History:  Victim of:  N/A  Report needed: No. Victim of Neglect:No. Perpetrator of N/A  Witness / Exposure to Domestic Violence: No   Protective Services Involvement: No  Witness to [employer] Violence:  No   Family History:  Family History  Problem Relation Age of Onset   Other Father        MVA   Lupus Sister     Living  situation: the patient lives with their family  Sexual Orientation: Straight  Relationship Status: married  Name of spouse Christopher Yu If a parent, number of children / ages:Two girls, ages 40 and 78  Support Systems: spouse  Surveyor, quantity Stress:  Yes   Income/Employment/Disability: Software engineer: unknown  Educational History: Education: unknown  Oncologist: unknown  Any cultural differences that may affect / interfere with treatment:  not applicable   Recreation/Hobbies: golfing  Stressors: Financial difficulties   Health problems    Strengths: Family  Barriers:  severe physical limitations that compromises activity   Legal History: Pending legal issue / charges: The patient has no significant history of legal issues. History of legal issue / charges: N/A  Medical History/Surgical History: reviewed Past Medical History:  Diagnosis Date   Anxiety    Arthritis, rheumatoid (HCC)    dx 1988   CAD (coronary artery disease), native coronary artery    Mild LAD disease with calcification noted at cath 12//27/16    Cardiomyopathy (HCC)    Cardiomyopathy (HCC)    Chronic systolic heart failure (HCC) 05/01/2015   Depression    Deviated nasal septum 06/25/2011   Headache    High cholesterol    Hyperlipidemia    Impingement syndrome of left shoulder 12/22/2017   Macular degeneration    Nausea and vomiting 08/28/2022    Rheumatoid aortitis    Sleep apnea    does not use cpap - UNABLE TO TOLERATE MASK   Sleep apnea in adult    deviated septum repaired, most recent sleep study was negative   Status post total right knee replacement 06/01/2015    Past Surgical History:  Procedure Laterality Date   ANKLE FUSION Right 05/05/2012   related to his arthritis   ANKLE FUSION Left 05/25/2013   related to his arthritis   ANKLE SURGERY Bilateral    APPLICATION OF CRANIAL NAVIGATION N/A 04/11/2022   Procedure: APPLICATION OF CRANIAL NAVIGATION;  Surgeon: Lanis Pupa, MD;  Location: [facility] OR;  Service: Neurosurgery;  Laterality: N/A;   APPLICATION OF CRANIAL NAVIGATION N/A 08/08/2022   Procedure: APPLICATION OF CRANIAL NAVIGATION;  Surgeon: Lanis Pupa, MD;  Location: [facility] OR;  Service: Neurosurgery;  Laterality: N/A;   BIOPSY  06/09/2023   Procedure: BIOPSY;  Surgeon: Saintclair Jasper, MD;  Location: [facility];  Service: Gastroenterology;;   CARDIAC CATHETERIZATION N/A 05/01/2015   Procedure: Left Heart Cath and Coronary Angiography;  Surgeon: Victory LELON Sharps, MD;  Location: [facility] INVASIVE CV LAB;  Service: Cardiovascular;  Laterality: N/A;   COLONOSCOPY WITH PROPOFOL  N/A 06/09/2023   Procedure: COLONOSCOPY WITH PROPOFOL ;  Surgeon: Saintclair Jasper, MD;  Location: [facility];  Service: Gastroenterology;  Laterality: N/A;   CRANIOTOMY N/A 04/11/2022   Procedure: STEREOTACTIC SUBOCCIPITAL CRANIOTOMY FOR RESECTION OF ARTERIO-VENOUS MALFORMATION;  Surgeon: Lanis Pupa, MD;  Location: [facility];  Service: Neurosurgery;  Laterality: N/A;   ESOPHAGOGASTRODUODENOSCOPY (EGD) WITH PROPOFOL  N/A 06/09/2023   Procedure: ESOPHAGOGASTRODUODENOSCOPY (EGD) WITH PROPOFOL ;  Surgeon: Saintclair Jasper, MD;  Location: [facility];  Service: Gastroenterology;  Laterality: N/A;   FRACTURE SURGERY     left femur fracture x 3   HEMOSTASIS CLIP PLACEMENT  06/09/2023   Procedure: HEMOSTASIS CLIP PLACEMENT;  Surgeon: Saintclair Jasper, MD;  Location: [facility];  Service: Gastroenterology;;   IR ANGIO EXTERNAL CAROTID SEL EXT CAROTID BILAT MOD SED  01/28/2022   IR ANGIO INTRA EXTRACRAN SEL INTERNAL CAROTID BILAT MOD SED  01/28/2022   IR ANGIO INTRA EXTRACRAN SEL INTERNAL CAROTID  BILAT MOD SED  04/16/2022   IR ANGIO VERTEBRAL SEL VERTEBRAL BILAT MOD SED  01/28/2022   IR ANGIO VERTEBRAL SEL VERTEBRAL BILAT MOD SED  04/16/2022   KNEE ARTHROSCOPY     right x 4   LAPAROSCOPIC REVISION VENTRICULAR-PERITONEAL (V-P) SHUNT N/A 08/08/2022   Procedure: LAPAROSCOPIC VENTRICULAR-PERITONEAL (V-P) SHUNT;  Surgeon: Vanderbilt Ned, MD;  Location: [facility];  Service: General;  Laterality: N/A;   LUMBAR LAMINECTOMY/DECOMPRESSION MICRODISCECTOMY N/A 07/18/2022   Procedure: Repair of Pseudomeningiocele posterior;  Surgeon: Lanis Pupa, MD;  Location: [facility];  Service: Neurosurgery;  Laterality: N/A;   NASAL SEPTOPLASTY W/ TURBINOPLASTY  06/25/2011   Procedure: NASAL SEPTOPLASTY WITH TURBINATE REDUCTION;  Surgeon: Alm Bouche, MD;  Location: [facility] OR;  Service: ENT;  Laterality: Bilateral;   PLACEMENT OF LUMBAR DRAIN N/A 07/18/2022   Procedure: PLACEMENT OF LUMBAR DRAIN;  Surgeon: Lanis Pupa, MD;  Location: [facility];  Service: Neurosurgery;  Laterality: N/A;   POLYPECTOMY  06/09/2023   Procedure: POLYPECTOMY;  Surgeon: Saintclair Jasper, MD;  Location: [facility];  Service: Gastroenterology;;   TONSILLECTOMY     TOTAL KNEE ARTHROPLASTY Right 06/01/2015   Procedure: RIGHT TOTAL KNEE ARTHROPLASTY;  Surgeon: Lonni CINDERELLA Poli, MD;  Location: [facility];  Service: Orthopedics;  Laterality: Right;  Block+general   VENTRICULOPERITONEAL SHUNT Right 08/08/2022   Procedure: LAP ASSITED SHUNT INSERTION VENTRICULAR-PERITONEAL;  Surgeon: Lanis Pupa, MD;  Location: [facility];  Service: Neurosurgery;  Laterality: Right;    Medications: Current Outpatient Medications  Medication Sig Dispense Refill   acetaminophen  (TYLENOL ) 325 MG tablet Take 2 tablets (650 mg total)  by mouth 3 (three) times daily.     alendronate (FOSAMAX) 10 MG tablet Take 10 mg by mouth daily before breakfast. Take with a full glass of water  on an empty stomach.     artificial tears (LACRILUBE) OINT ophthalmic ointment Place into both eyes 2 (two) times daily.     aspirin  EC 81 MG tablet Take 1 tablet (81 mg total) by mouth daily. Swallow whole. 30 tablet 12   calcium  carbonate (TUMS - DOSED IN MG ELEMENTAL CALCIUM ) 500 MG chewable tablet Chew 1 tablet (200 mg of elemental calcium  total) by mouth daily with supper.     Cholecalciferol (VITAMIN D3) 50 MCG (2000 UT) TABS Take 2,000 Units by mouth daily.     famotidine (PEPCID) 40 MG tablet 1 tablet Orally Once a day for 30 days     folic acid  (FOLVITE ) 1 MG tablet Take 1 mg by mouth daily.     methotrexate  (RHEUMATREX) 2.5 MG tablet Take 20 mg by mouth once a week. Takes 8 tablets(20mg ) every Saturday.     Caution:Chemotherapy. Protect from light.     metoprolol  succinate (TOPROL -XL) 25 MG 24 hr tablet TAKE 1 TABLET DAILY 90 tablet 3   Multiple Vitamins-Minerals (PRESERVISION AREDS 2 PO) Take 2 capsules by mouth daily.     predniSONE  (STERAPRED UNI-PAK 21 TAB) 5 MG (21) TBPK tablet Take 5 mg by mouth every morning.     RITUXAN  500 MG/50ML injection See admin instructions. Every 6 months 2 dose infusion Ruxience      rosuvastatin  (CRESTOR ) 20 MG tablet Take 1 tablet (20 mg total) by mouth daily. Patient must schedule annual appointment for further refills first attempt 30 tablet 0   sertraline  (ZOLOFT ) 20 MG/ML concentrated solution Take 25 mg by mouth daily.     spironolactone  (ALDACTONE ) 25 MG tablet 12.5 mg. 1/2 tab daily     sulfaSALAzine  (AZULFIDINE ) 500 MG tablet  Take 1,000 mg by mouth 2 (two) times daily.     traMADol  (ULTRAM ) 50 MG tablet Take 1 tablet (50 mg total) by mouth every 12 (twelve) hours as needed for moderate pain. (Patient taking differently: Take 50 mg by mouth as needed for moderate pain (pain score 4-6).) 60 tablet 2    traZODone  (DESYREL ) 50 MG tablet TAKE 1.5 TABLETS (75 MG TOTAL) BY MOUTH AT BEDTIME AS NEEDED. FOR SLEEP 135 tablet 5   White Petrolatum -Mineral Oil (GENTEAL TEARS NIGHT-TIME OP) Apply to eye at bedtime.     No current facility-administered medications for this visit.    Allergies  Allergen Reactions   Hydroxychloroquine      Other Reaction(s): cardiolmyopathy    Diagnoses:  Major depression  Plan of Care: Outpatient psychotherapy and medication management as needed. Initial Note: (With wife Christopher Yu). She says that she has hoped Zerek would get into therapy. He has Ataxic Tremor. He told Dr. Evonnie that he would like medication to just sit and do nothing. He says he always had golf to rely on and now cannot play. In December, he went n for a surgery to remove an avm from brain. Was told it was a routine surgery and was told that he needs to get it out. Was told it would be a short recovery and he would be back to work. 1 1/2 hour surgery turned into 6 hour surgery and he had complications. He had extra cerebral fluid and Dr. said it would absorb and just wait. He kept declining and by March, they went in to try and drain the extra fluid. He was in hospital for a week. Wife noticed that during the hospital stay, his speech declined and he was clearly weak. Nurses were trying to discharge him and she protested, but they sent him home anyway. He declined, and could not sit, stand, talk or walk and she ended up calling ambulance. In ER, she ws told he had a stroke. He was in ER 24 hours before getting a room in Neuro area. Head of stroke team came in and said it was not a stroke, but complications from surgery. She was blamed by medical team for not speaking up before being initially discharged. He ended up in a rehab for 2 weeks and told them he needed another surgery. Was told he had hydrocephalus and need a shunt to drain liquid off his brain. She felt a lot of pressure from team that he needed surgery.  She wanted a second opinion and had rouble getting it. Spoke to another Dr. and they decided to get the third surgery. He was in OT,PT and Speech Therapy. During therapy, he started having a tremor. They ended up being referred to Dr. Evonnie, who has now told him that treating his tremor is tricky and will take time.  Xaine is no longer in OT,PT speech therapy. He is supposed to be doing exercises on his own, but has not been emotionally able to ge himself to do it. He has persistent headaches as well. Just had a CAT Scan and awaiting the results. His headaches are worse and they are concerned. He also has rhumatory arthritis and it is flaring up because he had to stop meds. He has cardiomyopathy, macular degeneration on top of all of these other conditions.  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 He has been approved for disability and are working  to get social security disability. She works part time. They have 2 daughters, one with diabetes and arthritis. She is heading to [employer] this Fall and wants to work in Research scientist (physical sciences). Other daughter is 75 and is our introvert with lots of anxiety. She is in therapy and has mental health issues that is being treated. She will be a Holiday representative at the middle college at [employer].  They are told that medicine will not control his condition, but they will have to try to retrain his brain. Suggested that they work to get back into OT and PT. She has a hard time motivating him. He admits that he keeps things inside. She says he is not a good comunicator. He will not share things like if he is in pain or hurts himself.   Tx. Plan/Goals: Patient reports depressive symptoms secondary to the medical trauma he has suffered and the debilitating physical results. He is seeking therapy to resolve his feelings of helplessness and hopelessness. Also want assistance in improving his drive/motivation in daily activity.  Will engage in therapy that utilizes behavioral approaches to mitigate symptoms along with insight oriented counseling. Will also incorporate some conjoint sessions with spouse as needed, given her integral involvement with his care. Patient agrees with plan for treatment. Goal Date is 12-25    Patient agrees to be seen in video (caregility) session and is aware of the limitations of this platform. He is at home and provider in home office. Session Note:  Patient states that he and wife went to friends pool over weekend and it was enjoyable. He talked about being bored that not much is going on in his life. He claims that moods are steady with no significant changes, No Particular plans moving forward. Will focus upcoming sessions on establishing a nurturing and more positive outlook toward himself                                                                                                                               CONI ALM KERNS, PhD 2:10p-3:00p 50 minutes                                    CONI ALM KERNS, PhD

## 2023-12-31 ENCOUNTER — Ambulatory Visit: Payer: Self-pay | Attending: Physical Medicine and Rehabilitation | Admitting: Speech Pathology

## 2023-12-31 DIAGNOSIS — R1312 Dysphagia, oropharyngeal phase: Secondary | ICD-10-CM | POA: Insufficient documentation

## 2023-12-31 DIAGNOSIS — R471 Dysarthria and anarthria: Secondary | ICD-10-CM | POA: Insufficient documentation

## 2023-12-31 NOTE — Therapy (Signed)
 OUTPATIENT SPEECH LANGUAGE PATHOLOGY TREATMENT NOTE   Patient Name: Christopher Yu MRN: [id number] DOB:01/11/1961, 63 y.o., male Today's Date: 12/31/2023  PCP: Dwight Trula SQUIBB, MD REFERRING PROVIDER: Emeline Joesph BROCKS, DO  END OF SESSION:  End of Session - 12/31/23 1532     Visit Number 8    Number of Visits 17    Date for SLP Re-Evaluation 01/15/24   extedned for additional visits   Authorization Type [employer]    SLP Start Time 1532    SLP Stop Time  1615    SLP Time Calculation (min) 43 min    Activity Tolerance Patient tolerated treatment well          Past Medical History:  Diagnosis Date   Anxiety    Arthritis, rheumatoid (HCC)    dx 1988   CAD (coronary artery disease), native coronary artery    Mild LAD disease with calcification noted at cath 12//27/16    Cardiomyopathy (HCC)    Cardiomyopathy (HCC)    Chronic systolic heart failure (HCC) 05/01/2015   Depression    Deviated nasal septum 06/25/2011   Headache    High cholesterol    Hyperlipidemia    Impingement syndrome of left shoulder 12/22/2017   Macular degeneration    Nausea and vomiting 08/28/2022   Rheumatoid aortitis    Sleep apnea    does not use cpap - UNABLE TO TOLERATE MASK   Sleep apnea in adult    deviated septum repaired, most recent sleep study was negative   Status post total right knee replacement 06/01/2015   Past Surgical History:  Procedure Laterality Date   ANKLE FUSION Right 05/05/2012   related to his arthritis   ANKLE FUSION Left 05/25/2013   related to his arthritis   ANKLE SURGERY Bilateral    APPLICATION OF CRANIAL NAVIGATION N/A 04/11/2022   Procedure: APPLICATION OF CRANIAL NAVIGATION;  Surgeon: Lanis Pupa, MD;  Location: [facility];  Service: Neurosurgery;  Laterality: N/A;   APPLICATION OF CRANIAL NAVIGATION N/A 08/08/2022   Procedure: APPLICATION OF CRANIAL NAVIGATION;  Surgeon: Lanis Pupa, MD;  Location: [facility];  Service: Neurosurgery;  Laterality: N/A;    BIOPSY  06/09/2023   Procedure: BIOPSY;  Surgeon: Saintclair Jasper, MD;  Location: [facility];  Service: Gastroenterology;;   CARDIAC CATHETERIZATION N/A 05/01/2015   Procedure: Left Heart Cath and Coronary Angiography;  Surgeon: Victory LELON Sharps, MD;  Location: [facility] IN[facility]IVE [facility];  Service: Cardiovascular;  Laterality: N/A;   COLONOSCOPY WITH PROPOFOL  N/A 06/09/2023   Procedure: COLONOSCOPY WITH PROPOFOL ;  Surgeon: Saintclair Jasper, MD;  Location: [facility];  Service: Gastroenterology;  Laterality: N/A;   CRANIOTOMY N/A 04/11/2022   Procedure: STEREOTACTIC SUBOCCIPITAL CRANIOTOMY FOR RESECTION OF ARTERIO-VENOUS MALFORMATION;  Surgeon: Lanis Pupa, MD;  Location: [facility];  Service: Neurosurgery;  Laterality: N/A;   ESOPHAGOGASTRODUODENOSCOPY (EGD) WITH PROPOFOL  N/A 06/09/2023   Procedure: ESOPHAGOGASTRODUODENOSCOPY (EGD) WITH PROPOFOL ;  Surgeon: Saintclair Jasper, MD;  Location: [facility];  Service: Gastroenterology;  Laterality: N/A;   FRACTURE SURGERY     left femur fracture x 3   HEMOSTASIS CLIP PLACEMENT  06/09/2023   Procedure: HEMOSTASIS CLIP PLACEMENT;  Surgeon: Saintclair Jasper, MD;  Location: [facility];  Service: Gastroenterology;;   IR ANGIO EXTERNAL CAROTID SEL EXT CAROTID BILAT MOD SED  01/28/2022   IR ANGIO INTRA EXTRACRAN SEL INTERNAL CAROTID BILAT MOD SED  01/28/2022   IR ANGIO INTRA EXTRACRAN SEL INTERNAL CAROTID BILAT MOD SED  04/16/2022   IR ANGIO VERTEBRAL SEL VERTEBRAL  BILAT MOD SED  01/28/2022   IR ANGIO VERTEBRAL SEL VERTEBRAL BILAT MOD SED  04/16/2022   KNEE ARTHROSCOPY     right x 4   LAPAROSCOPIC REVISION VENTRICULAR-PERITONEAL (V-P) SHUNT N/A 08/08/2022   Procedure: LAPAROSCOPIC VENTRICULAR-PERITONEAL (V-P) SHUNT;  Surgeon: Vanderbilt Ned, MD;  Location: [facility];  Service: General;  Laterality: N/A;   LUMBAR LAMINECTOMY/DECOMPRESSION MICRODISCECTOMY N/A 07/18/2022   Procedure: Repair of Pseudomeningiocele posterior;  Surgeon: Lanis Pupa, MD;  Location: [facility];  Service:  Neurosurgery;  Laterality: N/A;   NASAL SEPTOPLASTY W/ TURBINOPLASTY  06/25/2011   Procedure: NASAL SEPTOPLASTY WITH TURBINATE REDUCTION;  Surgeon: Alm Bouche, MD;  Location: [facility] OR;  Service: ENT;  Laterality: Bilateral;   PLACEMENT OF LUMBAR DRAIN N/A 07/18/2022   Procedure: PLACEMENT OF LUMBAR DRAIN;  Surgeon: Lanis Pupa, MD;  Location: [facility];  Service: Neurosurgery;  Laterality: N/A;   POLYPECTOMY  06/09/2023   Procedure: POLYPECTOMY;  Surgeon: Saintclair Jasper, MD;  Location: [facility];  Service: Gastroenterology;;   TONSILLECTOMY     TOTAL KNEE ARTHROPLASTY Right 06/01/2015   Procedure: RIGHT TOTAL KNEE ARTHROPLASTY;  Surgeon: Lonni CINDERELLA Poli, MD;  Location: [facility];  Service: Orthopedics;  Laterality: Right;  Block+general   VENTRICULOPERITONEAL SHUNT Right 08/08/2022   Procedure: LAP ASSITED SHUNT INSERTION VENTRICULAR-PERITONEAL;  Surgeon: Lanis Pupa, MD;  Location: [facility];  Service: Neurosurgery;  Laterality: Right;   Patient Active Problem List   Diagnosis Date Noted   Dysphagia 05/04/2023   At risk for central sleep apnea 03/05/2023   Chronic nonintractable headache 02/02/2023   Insomnia 10/20/2022   Anxiety and depression 10/20/2022   Nausea and vomiting 08/28/2022   Adjustment disorder 08/06/2022   Protein-calorie malnutrition, severe 07/31/2022   Cerebellar stroke (HCC) 07/29/2022   Ischemic cerebrovascular accident (CVA) (HCC) 07/28/2022   Prolonged QT interval 07/28/2022   Pseudomeningocele 07/18/2022   Pseudomeningocele due to surgical procedure 07/18/2022   Status post craniotomy 04/11/2022   AVM (arteriovenous malformation) brain 04/11/2022   Impingement syndrome of left shoulder 12/22/2017   Impingement syndrome of right shoulder 12/22/2017   Rheumatoid arthritis involving right knee (HCC) 06/01/2015   Status post total right knee replacement 06/01/2015   Cardiomyopathy (HCC) 05/02/2015   Hyperlipidemia    Sleep apnea in adult    Rheumatoid  arthritis (HCC)    Chronic systolic heart failure (HCC) 05/01/2015   CAD (coronary artery disease), native coronary artery     ONSET DATE: 08/13/2023 (referral date); CVA 07/28/22  REFERRING DIAG: R13.10 (ICD-10-CM) - Dysphagia, unspecified type I63.9 (ICD-10-CM) - Cerebellar stroke (HCC)  THERAPY DIAG:  Dysphagia, oropharyngeal phase  Dysarthria and anarthria  Rationale for Evaluation and Treatment: Rehabilitation  SUBJECTIVE:   SUBJECTIVE STATEMENT: rarely regarding HEP completion    PERTINENT HISTORY: Christopher Yu is seen today referred for  MBSS and outpatient ST as he and his wife both reported increased coughing with solids and thin liquids, which prompted scheduling MBSS. Pt has a hx of dysarthria and dysphagia following stroke in March 2024. Prior MBSS completed 08/18/22 with recommendation of a regular diet, nectar-thick liquids, and L head turn strategy. He completed outpatient SLP tx in June 2024 and self-advanced diet to regular solids and thin liquids and he had elected to defer head turn left with swallow, therefore a follow up MBSS was forgone. Per Christopher Yu and wife, he has not had any concerns for pneumonia or pulmonary infection since advancing to thin liquids, despite coughing occurences.  PAIN:  Are you having pain? No  FALLS: Has patient fallen in last 6 months?  No  LIVING ENVIRONMENT: Lives with: lives with their family Lives in: House/apartment  PLOF:  Level of assistance: Independent with ADLs, Needed assistance with IADLS Employment: On disability  PATIENT GOALS: To improve my ability to communicate  OBJECTIVE:  Note: Objective measures were completed at Evaluation unless otherwise noted. OBJECTIVE:   INSTRUMENTAL SWALLOW STUDY FINDINGS (MBSS)  Objective swallow impairments: There was frank aspiration with delayed cough response of thin liquids by cup with head in neutral position.  -No penetration or aspiration of pudding or cracker  trial. -L head turn significantly aided airway protection with thin liquids. While it did not completely eliminate aspiration risk, the amount of penetration/aspiration was significantly reduced. Continued to observe deep, stagnant penetration of thin liquids (in isolation) with use of L head turn.  -L Head turn plus Chin tuck resulted in trace silent aspiration of thin liquids. Chin tuck not recommended.  Objective recommended compensations:    -Regular diet  -Thin liquids with L Head Turn -PO meds whole in pudding or applesauce -Consider nectar-thick liquids if Christopher Yu has difficulty remembering L head turn strategy or if coughing with thin liquids becomes bothersome.  -Follow up with OP SLP    COGNITION: Overall cognitive status: Impaired Areas of impairment:  Attention: Impaired: Alternating, Divided Awareness: Impaired: Anticipatory Functional deficits:   SUBJECTIVE DYSPHAGIA REPORTS:  Date of onset: 07/28/22 Reported symptoms: coughing with liquids and choking with liquids  Current diet: regular and thin liquids  Co-morbid voice changes: No  FACTORS WHICH MAY INCREASE RISK OF ADVERSE EVENT IN PRESENCE OF ASPIRATION:  General health: poor general health  Risk factors: none evident     ORAL MOTOR EXAMINATION: Overall status: Impaired:   Labial: Bilateral (Coordination) Lingual: Bilateral (Coordination) Velum: Coordination Comments:   CLINICAL SWALLOW ASSESSMENT:   Dentition: adequate natural dentition Vocal quality at baseline: normal Patient directly observed with POs: Yes: thin liquids  Feeding: able to feed self Liquids provided by: coke bottle [facility] Swallow Protocol: n/a Oral phase signs and symptoms: prolonged bolus formation Pharyngeal phase signs and symptoms: immediate cough                                                                                                                          TREATMENT DATE:  12/31/23: Swallow addressed swallow via  review of swallow strategies and compensations to aid in safe/effective swallow of least restrictive diet. Pt evidenced understanding via teach back with 100% returns. Demos head turn with thin liquids, no cough response noted. Reviewed exercises and rec for ongoing HEP completion.   Speech Addressed speech via structured practice of multi-syllabic words and optimizing breath support for generative sentences with pt demonstrating success 95% of opportunities. Despite rare error, intelligibility intact. Target carryover of dysarthria strategies to conversational speech, pt 98% intelligible across 20 minute conversation. Pt with HEP routine to continue addressing motor speech deficits.   11/26/23: Swallow reviewed HEP recommendations, pt is not  completing per the recommended frequency. He is only doing 10, QID. SLP led pt through x40 effortful swallows, using stopwatch for feedback of time commitment required. Pt completed 5 sets of 5 reps of EMST at current setting, rating as challenging. Did not adjust device today. Rare min-A for accuracy completion.   Speech reviewed clear speech strategies. Targeted carryover at increasing levels of difficulty. Word level, pt 100% accuracy, generative sentences 80% accuracy, evidencing telescoping d/t decreased coordination of breath and speaking. In conversation, pt with degradation of quality over extended utterances with SLP providing feedback to enhance understanding of factors impacting intelligibility.   Swallow:Initiated training in HEP for dysphagia - pt required frequent mod A for effortful swallow, masako, CTAR - unable to perform Mendelson with max A. Millard states I can't swallow 5x in a row. Reviewed swallowing precautions - with verbal cues Christopher Yu demonstrated head turn left 5/5 sips and verbalized effortful swallow. Effortful swallow employed to support mindfulness and intent when swallowing. He states he forgets to turn his head left - we generated  strategies of placing signs to remind him, placing TV to the left when he eats (although I recommend eliminating distractions with meals)and eating at a table. He reports biting his cheeks and tongue during meals. Instructed in using strategy of chewing and swallowing with intent to improve awareness. Education provided on using head turn and swallowing with intent with mixed consistencies as he does eat cereal and soup regularly.  Speech: initiated training in strategies to support intelligibility and expression including saying Christopher Yu's name and making sure he gets her attention before he starts speaking, as well as saying 1`-2 key words rather than pointing.    PATIENT EDUCATION: Education details: See Treatment above; HEP for dysphagia; swallow precautions; diet modifications; communication strategies Person educated: Patient and Spouse Education method: Explanation, Demonstration, Verbal cues, and Handouts Education comprehension: verbalized understanding, returned demonstration, verbal cues required, and needs further education   ASSESSMENT:  CLINICAL IMPRESSION: Patient is a 63 y.o. male who was seen today for mild to moderate oropharyngeal dysphagia; mild ataxic dysarthria; mild cognitive communication impairment. He is well known to us  from prior course of outpatient ST May and June of 2024. He has not carried over recommendation for head turn left and reports increased coughing with meals. Christopher Yu worries about him choking when she is not home. He is eating only soft foods when she is at work. Christopher Yu reports he is having difficulty communicating quickly and Christopher Yu endorses he is not talking as much, but pointing, even to get her attention. She states that he just starts talking and she doesn't know who he is talking to and she has to request information be repeated. Overall poor anticipatory awareness in lack of carrying over head turn left, not getting attention before speaking, biting his cheeks and  tongue. Skilled ST thus far focused on dysphagia exercise program and safety training, dysarthria strategies and compensations. Use of clear speech as rehabilitative technique to increase awareness and communication clarity. I recommend ongoing ST.   OBJECTIVE IMPAIRMENTS: include executive functioning, expressive language, dysarthria, and dysphagia. These impairments are limiting patient from return to work, managing medications, managing finances, ADLs/IADLs, effectively communicating at home and in community, and safety when swallowing. Factors affecting potential to achieve goals and functional outcome are cooperation/participation level. Patient will benefit from skilled SLP services to address above impairments and improve overall function.  REHAB POTENTIAL: Fair due to time post onset, cognitive impairments   GOALS: Goals reviewed with patient?  Yes  SHORT TERM GOALS: Target date: 11/12/23 (extended due to scheduling)  Pt will complete HEP for dysphagia with rare min A  Baseline: Goal status: MET  2.  Pt will follow swallow precautions and diet modifications with rare min A Baseline:  Goal status: MET  3.  Pt will verbally get person's attention and eye contact prior to speaking with rare min A Baseline:  Goal status: PARTIALLY MET  4.  Pt will report 50% reduced biting of his cheek and tongue while eating by chewing and swallowing with intent  Baseline:  Goal status: MET  LONG TERM GOALS: Target date: 01/15/2024 (for recertification)   Pt will complete HEP at least 1x a day for 1 week Baseline:  Goal status: NOT MET  2.  Pt will carryover swallow precautions including eating with intent and head turn left with mod I Baseline:  Goal status: MET  3.  Spouse will report 50% need to request repetition or clarification when Christopher Yu is talking Baseline:  Goal status: MET  4.  Pt and spouse will carryover 2 compensatory strategies including environmental modifications to  support processing and understanding of conversations Baseline:  Goal status: INITIAL   PLAN:  SLP FREQUENCY: 2x/week  SLP DURATION: 6 weeks - POC dates extended due to scheduling  PLANNED INTERVENTIONS: Aspiration precaution training, Pharyngeal strengthening exercises, Diet toleration management , Language facilitation, Environmental controls, Trials of upgraded texture/liquids, Cueing hierachy, Cognitive reorganization, Internal/external aids, Functional tasks, Multimodal communication approach, SLP instruction and feedback, Compensatory strategies, Patient/family education, [phone] Treatment of speech (30 or 45 min) , and [id number] Treatment of swallowing function  SPEECH THERAPY DISCHARGE SUMMARY  Visits from Start of Care: 8  Current functional level related to goals / functional outcomes: Reports mod-I implementation of strategies for improved swallow and speech. Endorses perceived improvement.    Remaining deficits: Dysphagia, dysarthria    Education / Equipment: multimodal communication techniques and implementation, dysarthria strategies and compensations, safe swallow strategies, dysphagia exercises, aspiration precautions, HEP   Patient agrees to discharge. Patient goals were met. Patient is being discharged due to meeting the stated rehab goals.  Christopher Yu, CCC-SLP 12/31/2023, 3:32 PM

## 2024-01-05 ENCOUNTER — Encounter: Payer: Self-pay | Admitting: Speech Pathology

## 2024-01-06 ENCOUNTER — Ambulatory Visit: Admitting: Psychology

## 2024-01-06 DIAGNOSIS — M0579 Rheumatoid arthritis with rheumatoid factor of multiple sites without organ or systems involvement: Secondary | ICD-10-CM | POA: Diagnosis not present

## 2024-01-07 ENCOUNTER — Encounter: Payer: Self-pay | Admitting: Speech Pathology

## 2024-01-07 DIAGNOSIS — G4733 Obstructive sleep apnea (adult) (pediatric): Secondary | ICD-10-CM | POA: Diagnosis not present

## 2024-01-11 DIAGNOSIS — R2689 Other abnormalities of gait and mobility: Secondary | ICD-10-CM | POA: Diagnosis not present

## 2024-01-12 ENCOUNTER — Encounter: Payer: Self-pay | Admitting: Speech Pathology

## 2024-01-12 ENCOUNTER — Ambulatory Visit (INDEPENDENT_AMBULATORY_CARE_PROVIDER_SITE_OTHER): Admitting: Psychology

## 2024-01-12 DIAGNOSIS — F329 Major depressive disorder, single episode, unspecified: Secondary | ICD-10-CM

## 2024-01-12 DIAGNOSIS — F321 Major depressive disorder, single episode, moderate: Secondary | ICD-10-CM

## 2024-01-12 NOTE — Progress Notes (Signed)
 [facility] Counselor Initial Adult Exam  Name: Christopher Yu Date: 01/12/2024 MRN: [id number] DOB: 1961-04-12 PCP: Dwight Trula SQUIBB, MD    Guardian/Payee:  N/A    Paperwork requested: No   Reason for Visit [name] Problem: Adjustment to serious medical illness  Mental Status Exam: Appearance:   Casual     Behavior:  Appropriate  Motor:  Shuffling Gait  Speech/Language:   Slurred  Affect:  Depressed  Mood:  depressed  Thought process:  normal  Thought content:    WNL  Sensory/Perceptual disturbances:    WNL  Orientation:  oriented to person, place, and situation  Attention:  Good  Concentration:  Good  Memory:  WNL  Fund of knowledge:   Good  Insight:    Good  Judgment:   unknown  Impulse Control:  unknown    Reported Symptoms:  Sadness, helplessness and hopelessness  Risk Assessment: Danger to Self:  No Self-injurious Behavior: No Danger to Others: No Duty to Warn:no Physical Aggression / Violence:No  Access to Firearms a concern: Unknown Gang Involvement:No  Patient / guardian was educated about steps to take if suicide or homicide risk level increases between visits: n/a While future psychiatric events cannot be accurately predicted, the patient does not currently require acute inpatient psychiatric care and does not currently meet [place]  involuntary commitment criteria.  Substance Abuse  History: Current substance abuse: No     Past Psychiatric History:   No previous psychological problems have been observed Outpatient Providers:N/A History of Psych Hospitalization: No  Psychological Testing: none   Abuse History:  Victim of:  N/A  Report needed: No. Victim of Neglect:No. Perpetrator of N/A  Witness / Exposure to Domestic Violence: No   Protective Services Involvement: No  Witness to [employer] Violence:  No   Family History:  Family History  Problem Relation Age of Onset   Other Father  MVA   Lupus Sister     Living situation: the patient lives with their family  Sexual Orientation: Straight  Relationship Status: married  Name of spouse [name] If a parent, number of children / ages:Two girls, ages 52 and 41  Support Systems: spouse  Surveyor, quantity Stress:  Yes   Income/Employment/Disability: Software engineer: unknown  Educational History: Education: unknown  Oncologist: unknown  Any cultural differences that may affect / interfere with treatment:  not applicable   Recreation/Hobbies: golfing  Stressors: Financial difficulties   Health problems    Strengths: Family  Barriers:  severe physical limitations that compromises activity   Legal History: Pending legal issue / charges: The patient has no significant history of legal issues. History of legal issue / charges: N/A  Medical History/Surgical History: reviewed Past Medical History:  Diagnosis Date   Anxiety    Arthritis, rheumatoid (HCC)    dx 1988   CAD (coronary artery disease), native coronary artery    Mild LAD disease with calcification noted at cath 12//27/16    Cardiomyopathy (HCC)    Cardiomyopathy (HCC)    Chronic systolic heart failure (HCC) 05/01/2015   Depression    Deviated nasal septum 06/25/2011   Headache    High cholesterol    Hyperlipidemia    Impingement syndrome of left shoulder 12/22/2017   Macular  degeneration    Nausea and vomiting 08/28/2022   Rheumatoid aortitis    Sleep apnea    does not use cpap - UNABLE TO TOLERATE MASK   Sleep apnea in adult    deviated septum repaired, most recent sleep study was negative   Status post total right knee replacement 06/01/2015    Past Surgical History:  Procedure Laterality Date   ANKLE FUSION Right 05/05/2012   related to his arthritis   ANKLE FUSION Left 05/25/2013   related to his arthritis   ANKLE SURGERY Bilateral    APPLICATION OF CRANIAL NAVIGATION N/A 04/11/2022   Procedure: APPLICATION OF CRANIAL NAVIGATION;  Surgeon: Lanis Pupa, MD;  [facility];  Service: Neurosurgery;  Laterality: N/A;   APPLICATION OF CRANIAL NAVIGATION N/A 08/08/2022   Procedure: APPLICATION OF CRANIAL NAVIGATION;  Surgeon: Lanis Pupa, MD;  [facility];  Service: Neurosurgery;  Laterality: N/A;   BIOPSY  06/09/2023   Procedure: BIOPSY;  Surgeon: Saintclair Jasper, MD;  [facility]: [facility];  Service: Gastroenterology;;   CARDIAC CATHETERIZATION N/A 05/01/2015   Procedure: Left Heart Cath and Coronary Angiography;  Surgeon: Victory LELON Sharps, MD;  Location: [facility] INVASIVE CV LAB;  Service: Cardiovascular;  Laterality: N/A;   COLONOSCOPY WITH PROPOFOL  N/A 06/09/2023   Procedure: COLONOSCOPY WITH PROPOFOL ;  Surgeon: Saintclair Jasper, MD;  [facility];  Service: Gastroenterology;  Laterality: N/A;   CRANIOTOMY N/A 04/11/2022   Procedure: STEREOTACTIC SUBOCCIPITAL CRANIOTOMY FOR RESECTION OF ARTERIO-VENOUS MALFORMATION;  Surgeon: Lanis Pupa, MD;  [facility];  Service: Neurosurgery;  Laterality: N/A;   ESOPHAGOGASTRODUODENOSCOPY (EGD) WITH PROPOFOL  N/A 06/09/2023   Procedure: ESOPHAGOGASTRODUODENOSCOPY (EGD) WITH PROPOFOL ;  Surgeon: Saintclair Jasper, MD;  [facility];  Service: Gastroenterology;  Laterality: N/A;   FRACTURE SURGERY     left femur fracture x 3   HEMOSTASIS CLIP PLACEMENT  06/09/2023   Procedure: HEMOSTASIS CLIP  PLACEMENT;  Surgeon: Saintclair Jasper, MD;  [facility];  Service: Gastroenterology;;   IR ANGIO EXTERNAL CAROTID SEL EXT CAROTID BILAT MOD SED  01/28/2022   IR ANGIO INTRA EXTRACRAN SEL INTERNAL CAROTID BILAT MOD SED  01/28/2022   IR ANGIO INTRA EXTRACRAN SEL INTERNAL CAROTID BILAT MOD SED  04/16/2022   IR ANGIO VERTEBRAL SEL VERTEBRAL BILAT MOD SED  01/28/2022   IR ANGIO VERTEBRAL SEL VERTEBRAL BILAT MOD SED  04/16/2022   KNEE ARTHROSCOPY     right x 4   LAPAROSCOPIC REVISION VENTRICULAR-PERITONEAL (V-P) SHUNT N/A 08/08/2022   Procedure: LAPAROSCOPIC VENTRICULAR-PERITONEAL (V-P) SHUNT;  Surgeon: Vanderbilt Ned, MD;  [facility];  Service: General;  Laterality: N/A;   LUMBAR LAMINECTOMY/DECOMPRESSION MICRODISCECTOMY N/A 07/18/2022   Procedure: Repair of Pseudomeningiocele posterior;  Surgeon: Lanis Pupa, MD;  [facility];  Service: Neurosurgery;  Laterality: N/A;   NASAL SEPTOPLASTY W/ TURBINOPLASTY  06/25/2011   Procedure: NASAL SEPTOPLASTY WITH TURBINATE REDUCTION;  Surgeon: Alm Bouche, MD;  Location: [facility] OR;  Service: ENT;  Laterality: Bilateral;   PLACEMENT OF LUMBAR DRAIN N/A 07/18/2022   Procedure: PLACEMENT OF LUMBAR DRAIN;  Surgeon: Lanis Pupa, MD;  [facility];  Service: Neurosurgery;  Laterality: N/A;   POLYPECTOMY  06/09/2023   Procedure: POLYPECTOMY;  Surgeon: Saintclair Jasper, MD;  Location: [facility];  Service: Gastroenterology;;   TONSILLECTOMY     TOTAL KNEE ARTHROPLASTY Right 06/01/2015   Procedure: RIGHT TOTAL KNEE ARTHROPLASTY;  Surgeon: Lonni CINDERELLA Poli, MD;  [facility]: [facility];  Service: Orthopedics;  Laterality: Right;  Block+general   VENTRICULOPERITONEAL SHUNT Right 08/08/2022   Procedure: LAP ASSITED SHUNT INSERTION VENTRICULAR-PERITONEAL;  Surgeon: Lanis Pupa, MD;  [facility];  Service: Neurosurgery;  Laterality: Right;    Medications: Current Outpatient Medications  Medication Sig Dispense Refill   acetaminophen   (TYLENOL ) 325 MG tablet Take 2 tablets (650 mg total) by mouth 3 (three) times daily.     alendronate (FOSAMAX) 10 MG tablet Take 10 mg by mouth daily before breakfast. Take with a full glass of water  on an empty stomach.     artificial tears (LACRILUBE) OINT ophthalmic ointment Place into both eyes 2 (two) times daily.     aspirin  EC 81 MG tablet Take 1 tablet (81 mg total) by mouth daily. Swallow whole. 30 tablet 12   calcium  carbonate (TUMS - DOSED IN MG ELEMENTAL CALCIUM ) 500 MG chewable tablet Chew 1 tablet (200 mg of elemental calcium  total) by mouth daily with supper.     Cholecalciferol (VITAMIN D3) 50 MCG (2000 UT) TABS Take 2,000 Units by mouth daily.     famotidine (PEPCID) 40 MG tablet 1 tablet Orally Once a day for 30 days     folic acid  (FOLVITE ) 1 MG tablet Take 1 mg by mouth daily.     methotrexate  (RHEUMATREX) 2.5 MG tablet Take 20 mg by mouth once a week. Takes 8 tablets(20mg ) every Saturday.     Caution:Chemotherapy. Protect from light.     metoprolol  succinate (TOPROL -XL) 25 MG 24 hr tablet TAKE 1 TABLET DAILY 90 tablet 3   Multiple Vitamins-Minerals (PRESERVISION AREDS 2 PO) Take 2 capsules by mouth daily.     predniSONE  (STERAPRED UNI-PAK 21 TAB) 5 MG (21) TBPK tablet Take 5 mg by mouth every morning.     RITUXAN  500 MG/50ML injection See admin instructions. Every 6 months 2 dose infusion Ruxience      rosuvastatin  (CRESTOR ) 20 MG tablet Take 1 tablet (20 mg total) by mouth daily. Patient must schedule annual appointment for further refills first attempt 30 tablet 0   sertraline  (ZOLOFT ) 20 MG/ML concentrated solution Take 25 mg by mouth daily.     spironolactone  (ALDACTONE ) 25 MG tablet 12.5 mg. 1/2 tab  daily     sulfaSALAzine  (AZULFIDINE ) 500 MG tablet Take 1,000 mg by mouth 2 (two) times daily.     traMADol  (ULTRAM ) 50 MG tablet Take 1 tablet (50 mg total) by mouth every 12 (twelve) hours as needed for moderate pain. (Patient taking differently: Take 50 mg by mouth as needed  for moderate pain (pain score 4-6).) 60 tablet 2   traZODone  (DESYREL ) 50 MG tablet TAKE 1.5 TABLETS (75 MG TOTAL) BY MOUTH AT BEDTIME AS NEEDED. FOR SLEEP 135 tablet 5   White Petrolatum -Mineral Oil (GENTEAL TEARS NIGHT-TIME OP) Apply to eye at bedtime.     No current facility-administered medications for this visit.    Allergies  Allergen Reactions   Hydroxychloroquine      Other Reaction(s): cardiolmyopathy    Diagnoses:  Major depression  Plan of Care: Outpatient psychotherapy and medication management as needed. Initial Note: (With wife [name]). She says that she has hoped Qunicy would get into therapy. He has Ataxic Tremor. He told Dr. Evonnie that he would like medication to just sit and do nothing. He says he always had golf to rely on and now cannot play. In December, he went n for a surgery to remove an avm from brain. Was told it was a routine surgery and was told that he needs to get it out. Was told it would be a short recovery and he would be back to work. 1 1/2 hour surgery turned into 6 hour surgery and he had complications. He had extra cerebral fluid and Dr. said it would absorb and just wait. He kept declining and by March, they went in to try and drain the extra fluid. He was in hospital for a week. Wife noticed that during the hospital stay, his speech declined and he was clearly weak. Nurses were trying to discharge him and she protested, but they sent him home anyway. He declined, and could not sit, stand, talk or walk and she ended up calling ambulance. In ER, she ws told he had a stroke. He was in ER 24 hours before getting a room in Neuro area. Head of stroke team came in and said it was not a stroke, but complications from surgery. She was blamed by medical team for not speaking up before being initially discharged. He ended up in a rehab for 2 weeks and told them he needed another surgery. Was told he had hydrocephalus and need a shunt to drain liquid off his brain. She felt a  lot of pressure from team that he needed surgery. She wanted a second opinion and had rouble getting it. Spoke to another Dr. and they decided to get the third surgery. He was in OT,PT and Speech Therapy. During therapy, he started having a tremor. They ended up being referred to Dr. Evonnie, who has now told him that treating his tremor is tricky and will take time.  Shandon is no longer in OT,PT speech therapy. He is supposed to be doing exercises on his own, but has not been emotionally able to ge himself to do it. He has persistent headaches as well. Just had a CAT Scan and awaiting the results. His headaches are worse and they are concerned. He also has rhumatory arthritis and it is flaring up because he had to stop meds. He has cardiomyopathy, macular degeneration on top of all of these other conditions.  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 He is still employed with insurance, but they fear he may be let  go. He has been approved for disability and are working to get social security disability. She works part time. They have 2 daughters, one with diabetes and arthritis. She is heading to [employer] this Fall and wants to work in Research scientist (physical sciences). Other daughter is 29 and is our introvert with lots of anxiety. She is in therapy and has mental health issues that is being treated. She will be a Holiday representative at the middle college at [employer].  They are told that medicine will not control his condition, but they will have to try to retrain his brain. Suggested that they work to get back into OT and PT. She has a hard time motivating him. He admits that he keeps things inside. She says he is not a good comunicator. He will not share things like if he is in pain or hurts himself.   Tx. Plan/Goals: Patient reports depressive symptoms secondary to the medical trauma he has suffered and the debilitating physical results. He is seeking therapy to resolve his feelings of helplessness and hopelessness. Also want assistance in  improving his drive/motivation in daily activity. Will engage in therapy that utilizes behavioral approaches to mitigate symptoms along with insight oriented counseling. Will also incorporate some conjoint sessions with spouse as needed, given her integral involvement with his care. Patient agrees with plan for treatment. Goal Date is 12-25    Patient agrees to be seen in video (caregility) session and is aware of the limitations of this platform. He is at home and provider in home office. Session Note:  Patient states that he and wife went to friends pool over weekend and it was enjoyable. He talked about being bored that not much is going on in his life. He claims that moods are steady with no significant changes, No Particular plans moving forward. Will focus upcoming sessions on establishing a nurturing and more positive outlook toward himself                                                                                                                               CONI ALM KERNS, PhD 2:10p-3:00p 50 minutes  [facility] Counselor Initial Adult Exam  Name: Christopher Yu Date: 01/12/2024 MRN: [id number] DOB: Sep 08, 1960 PCP: Dwight Trula SQUIBB, MD    Guardian/Payee:  N/A    Paperwork requested: No   Reason for Visit [name] Problem: Adjustment to serious medical illness  Mental Status  Exam: Appearance:   Casual     Behavior:  Appropriate  Motor:  Shuffling Gait  Speech/Language:   Slurred  Affect:  Depressed  Mood:  depressed  Thought process:  normal  Thought content:    WNL  Sensory/Perceptual disturbances:    WNL  Orientation:  oriented to person, place, and situation  Attention:  Good  Concentration:  Good  Memory:  WNL  Fund of knowledge:   Good  Insight:    Good  Judgment:   unknown  Impulse Control:  unknown    Reported Symptoms:  Sadness, helplessness and hopelessness  Risk Assessment: Danger to Self:  No Self-injurious Behavior: No Danger to Others: No Duty to Warn:no Physical Aggression / Violence:No  Access to Firearms a concern: Unknown Gang Involvement:No  Patient / guardian was educated about steps to take if suicide or homicide risk level increases between visits: n/a While future psychiatric events cannot be accurately predicted, the patient does not currently require acute inpatient psychiatric care and does not currently meet [place]  involuntary commitment criteria.  Substance Abuse History: Current substance abuse: No     Past Psychiatric History:   No previous psychological problems have been observed Outpatient Providers:N/A History of Psych Hospitalization: No  Psychological Testing: none   Abuse History:  Victim of:  N/A  Report needed: No. Victim of Neglect:No. Perpetrator of N/A  Witness / Exposure to Domestic Violence: No   Protective Services Involvement: No  Witness to [employer] Violence:  No   Family History:  Family History  Problem Relation Age of Onset   Other Father        MVA   Lupus Sister     Living situation: the patient lives with their family  Sexual Orientation: Straight  Relationship Status: married  Name of spouse [name] If a parent, number of children / ages:Two girls, ages 63 and 58  Support Systems: spouse  Surveyor, quantity Stress:  Yes   Income/Employment/Disability: Theme park manager: unknown  Educational History: Education: unknown  Oncologist: unknown  Any cultural differences that may affect / interfere with treatment:  not applicable   Recreation/Hobbies: golfing  Stressors: Financial difficulties   Health problems    Strengths: Family  Barriers:  severe physical limitations that compromises activity   Legal History: Pending legal issue / charges: The patient has no significant history of legal issues. History of legal issue / charges: N/A  Medical History/Surgical History: reviewed Past Medical History:  Diagnosis Date   Anxiety    Arthritis, rheumatoid (HCC)    dx 1988   CAD (coronary artery disease), native coronary artery    Mild LAD disease with calcification noted at cath 12//27/16    Cardiomyopathy (HCC)    Cardiomyopathy (HCC)    Chronic systolic heart failure (HCC) 05/01/2015   Depression    Deviated nasal septum 06/25/2011   Headache    High cholesterol    Hyperlipidemia    Impingement syndrome of left shoulder 12/22/2017   Macular degeneration    Nausea and vomiting 08/28/2022   Rheumatoid aortitis    Sleep apnea    does not use cpap - UNABLE TO TOLERATE MASK   Sleep apnea  in adult    deviated septum repaired, most recent sleep study was negative   Status post total right knee replacement 06/01/2015    Past Surgical History:  Procedure Laterality Date   ANKLE FUSION Right 05/05/2012   related to his arthritis   ANKLE FUSION Left 05/25/2013   related to his arthritis   ANKLE SURGERY Bilateral    APPLICATION OF CRANIAL NAVIGATION N/A 04/11/2022   Procedure: APPLICATION OF CRANIAL NAVIGATION;  Surgeon: Lanis Pupa, MD;  [facility];  Service: Neurosurgery;  Laterality: N/A;   APPLICATION OF CRANIAL NAVIGATION N/A 08/08/2022   Procedure: APPLICATION OF CRANIAL NAVIGATION;  Surgeon: Lanis Pupa, MD;  [facility];  Service: Neurosurgery;  Laterality: N/A;    BIOPSY  06/09/2023   Procedure: BIOPSY;  Surgeon: Saintclair Jasper, MD;  [facility];  Service: Gastroenterology;;   CARDIAC CATHETERIZATION N/A 05/01/2015   Procedure: Left Heart Cath and Coronary Angiography;  Surgeon: Victory LELON Sharps, MD;  Location: [facility] INVASIVE CV LAB;  Service: Cardiovascular;  Laterality: N/A;   COLONOSCOPY WITH PROPOFOL  N/A 06/09/2023   Procedure: COLONOSCOPY WITH PROPOFOL ;  Surgeon: Saintclair Jasper, MD;  [facility];  Service: Gastroenterology;  Laterality: N/A;   CRANIOTOMY N/A 04/11/2022   Procedure: STEREOTACTIC SUBOCCIPITAL CRANIOTOMY FOR RESECTION OF ARTERIO-VENOUS MALFORMATION;  Surgeon: Lanis Pupa, MD;  [facility];  Service: Neurosurgery;  Laterality: N/A;   ESOPHAGOGASTRODUODENOSCOPY (EGD) WITH PROPOFOL  N/A 06/09/2023   Procedure: ESOPHAGOGASTRODUODENOSCOPY (EGD) WITH PROPOFOL ;  Surgeon: Saintclair Jasper, MD;  [facility];  Service: Gastroenterology;  Laterality: N/A;   FRACTURE SURGERY     left femur fracture x 3   HEMOSTASIS CLIP PLACEMENT  06/09/2023   Procedure: HEMOSTASIS CLIP PLACEMENT;  Surgeon: Saintclair Jasper, MD;  [facility];  Service: Gastroenterology;;   IR ANGIO EXTERNAL CAROTID SEL EXT CAROTID BILAT MOD SED  01/28/2022   IR ANGIO INTRA EXTRACRAN SEL INTERNAL CAROTID BILAT MOD SED  01/28/2022   IR ANGIO INTRA EXTRACRAN SEL INTERNAL CAROTID BILAT MOD SED  04/16/2022   IR ANGIO VERTEBRAL SEL VERTEBRAL BILAT MOD SED  01/28/2022   IR ANGIO VERTEBRAL SEL VERTEBRAL BILAT MOD SED  04/16/2022   KNEE ARTHROSCOPY     right x 4   LAPAROSCOPIC REVISION VENTRICULAR-PERITONEAL (V-P) SHUNT N/A 08/08/2022   Procedure: LAPAROSCOPIC VENTRICULAR-PERITONEAL (V-P) SHUNT;  Surgeon: Vanderbilt Ned, MD;  [facility];  Service: General;  Laterality: N/A;   LUMBAR LAMINECTOMY/DECOMPRESSION MICRODISCECTOMY N/A 07/18/2022   Procedure: Repair of Pseudomeningiocele posterior;  Surgeon: Lanis Pupa, MD;  [facility];  Service:  Neurosurgery;  Laterality: N/A;   NASAL SEPTOPLASTY W/ TURBINOPLASTY  06/25/2011   Procedure: NASAL SEPTOPLASTY WITH TURBINATE REDUCTION;  Surgeon: Alm Bouche, MD;  [facility];  Service: ENT;  Laterality: Bilateral;   PLACEMENT OF LUMBAR DRAIN N/A 07/18/2022   Procedure: PLACEMENT OF LUMBAR DRAIN;  Surgeon: Lanis Pupa, MD;  [facility];  Service: Neurosurgery;  Laterality: N/A;   POLYPECTOMY  06/09/2023   Procedure: POLYPECTOMY;  Surgeon: Saintclair Jasper, MD;  Location: [facility];  Service: Gastroenterology;;   TONSILLECTOMY     TOTAL KNEE ARTHROPLASTY Right 06/01/2015   Procedure: RIGHT TOTAL KNEE ARTHROPLASTY;  Surgeon: Lonni CINDERELLA Poli, MD;  [facility];  Service: Orthopedics;  Laterality: Right;  Block+general   VENTRICULOPERITONEAL SHUNT Right 08/08/2022   Procedure: LAP ASSITED SHUNT INSERTION VENTRICULAR-PERITONEAL;  Surgeon: Lanis Pupa, MD;  [facility];  Service: Neurosurgery;  Laterality: Right;    Medications: Current Outpatient Medications  Medication  Sig Dispense Refill   acetaminophen  (TYLENOL ) 325 MG tablet Take 2 tablets (650 mg total) by mouth 3 (three) times daily.     alendronate (FOSAMAX) 10 MG tablet Take 10 mg by mouth daily before breakfast. Take with a full glass of water  on an empty stomach.     artificial tears (LACRILUBE) OINT ophthalmic ointment Place into both eyes 2 (two) times daily.     aspirin  EC 81 MG tablet Take 1 tablet (81 mg total) by mouth daily. Swallow whole. 30 tablet 12   calcium  carbonate (TUMS - DOSED IN MG ELEMENTAL CALCIUM ) 500 MG chewable tablet Chew 1 tablet (200 mg of elemental calcium  total) by mouth daily with supper.     Cholecalciferol (VITAMIN D3) 50 MCG (2000 UT) TABS Take 2,000 Units by mouth daily.     famotidine (PEPCID) 40 MG tablet 1 tablet Orally Once a day for 30 days     folic acid  (FOLVITE ) 1 MG tablet Take 1 mg by mouth daily.     methotrexate  (RHEUMATREX) 2.5 MG tablet Take 20 mg by  mouth once a week. Takes 8 tablets(20mg ) every Saturday.     Caution:Chemotherapy. Protect from light.     metoprolol  succinate (TOPROL -XL) 25 MG 24 hr tablet TAKE 1 TABLET DAILY 90 tablet 3   Multiple Vitamins-Minerals (PRESERVISION AREDS 2 PO) Take 2 capsules by mouth daily.     predniSONE  (STERAPRED UNI-PAK 21 TAB) 5 MG (21) TBPK tablet Take 5 mg by mouth every morning.     RITUXAN  500 MG/50ML injection See admin instructions. Every 6 months 2 dose infusion Ruxience      rosuvastatin  (CRESTOR ) 20 MG tablet Take 1 tablet (20 mg total) by mouth daily. Patient must schedule annual appointment for further refills first attempt 30 tablet 0   sertraline  (ZOLOFT ) 20 MG/ML concentrated solution Take 25 mg by mouth daily.     spironolactone  (ALDACTONE ) 25 MG tablet 12.5 mg. 1/2 tab daily     sulfaSALAzine  (AZULFIDINE ) 500 MG tablet Take 1,000 mg by mouth 2 (two) times daily.     traMADol  (ULTRAM ) 50 MG tablet Take 1 tablet (50 mg total) by mouth every 12 (twelve) hours as needed for moderate pain. (Patient taking differently: Take 50 mg by mouth as needed for moderate pain (pain score 4-6).) 60 tablet 2   traZODone  (DESYREL ) 50 MG tablet TAKE 1.5 TABLETS (75 MG TOTAL) BY MOUTH AT BEDTIME AS NEEDED. FOR SLEEP 135 tablet 5   White Petrolatum -Mineral Oil (GENTEAL TEARS NIGHT-TIME OP) Apply to eye at bedtime.     No current facility-administered medications for this visit.    Allergies  Allergen Reactions   Hydroxychloroquine      Other Reaction(s): cardiolmyopathy    Diagnoses:  Major depression  Plan of Care: Outpatient psychotherapy and medication management as needed. Initial Note: (With wife [name]). She says that she has hoped Christopher Yu would get into therapy. He has Ataxic Tremor. He told Dr. Evonnie that he would like medication to just sit and do nothing. He says he always had golf to rely on and now cannot play. In December, he went n for a surgery to remove an avm from brain. Was told it was a  routine surgery and was told that he needs to get it out. Was told it would be a short recovery and he would be back to work. 1 1/2 hour surgery turned into 6 hour surgery and he had complications. He had extra cerebral fluid and Dr. said it would absorb  and just wait. He kept declining and by March, they went in to try and drain the extra fluid. He was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

## 2024-01-14 ENCOUNTER — Encounter: Payer: Self-pay | Admitting: Speech Pathology

## 2024-01-20 ENCOUNTER — Ambulatory Visit: Admitting: Psychology

## 2024-01-20 DIAGNOSIS — F321 Major depressive disorder, single episode, moderate: Secondary | ICD-10-CM

## 2024-01-20 DIAGNOSIS — F329 Major depressive disorder, single episode, unspecified: Secondary | ICD-10-CM | POA: Diagnosis not present

## 2024-01-20 NOTE — Progress Notes (Signed)
 [facility] Counselor Initial Adult Exam  Name: Christopher Yu Date: 01/20/2024 MRN: [id number] DOB: 1960-05-16 PCP: Dwight Trula SQUIBB, MD    Guardian/Payee:  N/A    Paperwork requested: No   Reason for Visit [name] Problem: Adjustment to serious medical illness  Mental Status Exam: Appearance:   Casual     Behavior:  Appropriate  Motor:  Shuffling Gait  Speech/Language:   Slurred  Affect:  Depressed  Mood:  depressed  Thought process:  normal  Thought content:    WNL  Sensory/Perceptual disturbances:    WNL  Orientation:  oriented to person, place, and situation  Attention:  Good  Concentration:  Good  Memory:  WNL  Fund of knowledge:   Good  Insight:    Good  Judgment:   unknown  Impulse Control:  unknown    Reported Symptoms:  Sadness, helplessness and hopelessness  Risk Assessment: Danger to Self:  No Self-injurious Behavior: No Danger to Others: No Duty to Warn:no Physical Aggression / Violence:No  Access to Firearms a concern: Unknown Gang Involvement:No  Patient / guardian was educated about steps to take if suicide or homicide risk level increases between visits: n/a While future psychiatric events cannot be accurately predicted, the patient does not currently require acute inpatient psychiatric care and does not currently meet [place]  involuntary commitment  criteria.  Substance Abuse History: Current substance abuse: No     Past Psychiatric History:   No previous psychological problems have been observed Outpatient Providers:N/A History of Psych Hospitalization: No  Psychological Testing: none   Abuse History:  Victim of:  N/A  Report needed: No. Victim of Neglect:No. Perpetrator of N/A  Witness / Exposure to Domestic Violence: No   Protective Services Involvement: No  Witness to [employer] Violence:  No   Family History:  Family History  Problem Relation Age of Onset   Other Father        MVA   Lupus Sister     Living situation: the patient lives with their family  Sexual Orientation: Straight  Relationship Status: married  Name of spouse [name] If a parent, number of children / ages:Two girls, ages 88 and 14  Support Systems: spouse  Surveyor, quantity Stress:  Yes   Income/Employment/Disability: Software engineer: unknown  Educational History: Education: unknown  Oncologist: unknown  Any cultural differences that may affect / interfere with treatment:  not applicable   Recreation/Hobbies: golfing  Stressors: Financial difficulties   Health problems    Strengths: Family  Barriers:  severe physical limitations that compromises activity   Legal History: Pending legal issue / charges: The patient has no significant history of legal issues. History of legal issue / charges: N/A  Medical History/Surgical History: reviewed Past Medical History:  Diagnosis Date   Anxiety    Arthritis, rheumatoid (HCC)    dx 1988   CAD (coronary artery disease), native coronary artery    Mild LAD disease with calcification noted at cath 12//27/16    Cardiomyopathy (HCC)    Cardiomyopathy (HCC)    Chronic systolic heart failure (HCC) 05/01/2015   Depression    Deviated nasal septum 06/25/2011   Headache    High cholesterol    Hyperlipidemia    Impingement syndrome of left shoulder  12/22/2017   Macular degeneration    Nausea and vomiting 08/28/2022   Rheumatoid aortitis    Sleep apnea    does not use cpap - UNABLE TO TOLERATE MASK   Sleep apnea in adult    deviated septum repaired, most recent sleep study was negative   Status post total right knee replacement 06/01/2015    Past Surgical History:  Procedure Laterality Date   ANKLE FUSION Right 05/05/2012   related to his arthritis   ANKLE FUSION Left 05/25/2013   related to his arthritis   ANKLE SURGERY Bilateral    APPLICATION OF CRANIAL NAVIGATION N/A 04/11/2022   Procedure: APPLICATION OF CRANIAL NAVIGATION;  Surgeon: Lanis Pupa, MD;  [facility];  Service: Neurosurgery;  Laterality: N/A;   APPLICATION OF CRANIAL NAVIGATION N/A 08/08/2022   Procedure: APPLICATION OF CRANIAL NAVIGATION;  Surgeon: Lanis Pupa, MD;  [facility];  Service: Neurosurgery;  Laterality: N/A;   BIOPSY  06/09/2023   Procedure: BIOPSY;  Surgeon: Saintclair Jasper, MD;  [facility];  Service: Gastroenterology;;   CARDIAC CATHETERIZATION N/A 05/01/2015   Procedure: Left Heart Cath and Coronary Angiography;  Surgeon: Victory LELON Sharps, MD;  Location: [facility] INVASIVE CV LAB;  Service: Cardiovascular;  Laterality: N/A;   COLONOSCOPY WITH PROPOFOL  N/A 06/09/2023   Procedure: COLONOSCOPY WITH PROPOFOL ;  Surgeon: Saintclair Jasper, MD;  [facility];  Service: Gastroenterology;  Laterality: N/A;   CRANIOTOMY N/A 04/11/2022   Procedure: STEREOTACTIC SUBOCCIPITAL CRANIOTOMY FOR RESECTION OF ARTERIO-VENOUS MALFORMATION;  Surgeon: Lanis Pupa, MD;  [facility];  Service: Neurosurgery;  Laterality: N/A;   ESOPHAGOGASTRODUODENOSCOPY (EGD) WITH PROPOFOL  N/A 06/09/2023   Procedure: ESOPHAGOGASTRODUODENOSCOPY (EGD) WITH PROPOFOL ;  Surgeon: Saintclair Jasper, MD;  [facility];  Service: Gastroenterology;  Laterality: N/A;   FRACTURE SURGERY     left femur fracture x 3   HEMOSTASIS CLIP PLACEMENT  06/09/2023   Procedure:  HEMOSTASIS CLIP PLACEMENT;  Surgeon: Saintclair Jasper, MD;  [facility];  Service: Gastroenterology;;   IR ANGIO EXTERNAL CAROTID SEL EXT  CAROTID BILAT MOD SED  01/28/2022   IR ANGIO INTRA EXTRACRAN SEL INTERNAL CAROTID BILAT MOD SED  01/28/2022   IR ANGIO INTRA EXTRACRAN SEL INTERNAL CAROTID BILAT MOD SED  04/16/2022   IR ANGIO VERTEBRAL SEL VERTEBRAL BILAT MOD SED  01/28/2022   IR ANGIO VERTEBRAL SEL VERTEBRAL BILAT MOD SED  04/16/2022   KNEE ARTHROSCOPY     right x 4   LAPAROSCOPIC REVISION VENTRICULAR-PERITONEAL (V-P) SHUNT N/A 08/08/2022   Procedure: LAPAROSCOPIC VENTRICULAR-PERITONEAL (V-P) SHUNT;  Surgeon: Vanderbilt Ned, MD;  [facility];  Service: General;  Laterality: N/A;   LUMBAR LAMINECTOMY/DECOMPRESSION MICRODISCECTOMY N/A 07/18/2022   Procedure: Repair of Pseudomeningiocele posterior;  Surgeon: Lanis Pupa, MD;  [facility];  Service: Neurosurgery;  Laterality: N/A;   NASAL SEPTOPLASTY W/ TURBINOPLASTY  06/25/2011   Procedure: NASAL SEPTOPLASTY WITH TURBINATE REDUCTION;  Surgeon: Alm Bouche, MD;  Location: [facility] OR;  Service: ENT;  Laterality: Bilateral;   PLACEMENT OF LUMBAR DRAIN N/A 07/18/2022   Procedure: PLACEMENT OF LUMBAR DRAIN;  Surgeon: Lanis Pupa, MD;  [facility];  Service: Neurosurgery;  Laterality: N/A;   POLYPECTOMY  06/09/2023   Procedure: POLYPECTOMY;  Surgeon: Saintclair Jasper, MD;  Location: [facility];  Service: Gastroenterology;;   TONSILLECTOMY     TOTAL KNEE ARTHROPLASTY Right 06/01/2015   Procedure: RIGHT TOTAL KNEE ARTHROPLASTY;  Surgeon: Lonni CINDERELLA Poli, MD;  [facility];  Service: Orthopedics;  Laterality: Right;  Block+general   VENTRICULOPERITONEAL SHUNT Right 08/08/2022   Procedure: LAP ASSITED SHUNT INSERTION VENTRICULAR-PERITONEAL;  Surgeon: Lanis Pupa, MD;  [facility];  Service: Neurosurgery;  Laterality: Right;    Medications: Current Outpatient Medications  Medication Sig Dispense Refill    acetaminophen  (TYLENOL ) 325 MG tablet Take 2 tablets (650 mg total) by mouth 3 (three) times daily.     alendronate (FOSAMAX) 10 MG tablet Take 10 mg by mouth daily before breakfast. Take with a full glass of water  on an empty stomach.     artificial tears (LACRILUBE) OINT ophthalmic ointment Place into both eyes 2 (two) times daily.     aspirin  EC 81 MG tablet Take 1 tablet (81 mg total) by mouth daily. Swallow whole. 30 tablet 12   calcium  carbonate (TUMS - DOSED IN MG ELEMENTAL CALCIUM ) 500 MG chewable tablet Chew 1 tablet (200 mg of elemental calcium  total) by mouth daily with supper.     Cholecalciferol (VITAMIN D3) 50 MCG (2000 UT) TABS Take 2,000 Units by mouth daily.     famotidine (PEPCID) 40 MG tablet 1 tablet Orally Once a day for 30 days     folic acid  (FOLVITE ) 1 MG tablet Take 1 mg by mouth daily.     methotrexate  (RHEUMATREX) 2.5 MG tablet Take 20 mg by mouth once a week. Takes 8 tablets(20mg ) every Saturday.     Caution:Chemotherapy. Protect from light.     metoprolol  succinate (TOPROL -XL) 25 MG 24 hr tablet TAKE 1 TABLET DAILY 90 tablet 3   Multiple Vitamins-Minerals (PRESERVISION AREDS 2 PO) Take 2 capsules by mouth daily.     predniSONE  (STERAPRED UNI-PAK 21 TAB) 5 MG (21) TBPK tablet Take 5 mg by mouth every morning.     RITUXAN  500 MG/50ML injection See admin instructions. Every 6 months 2 dose infusion Ruxience      rosuvastatin  (CRESTOR ) 20 MG tablet Take 1 tablet (20 mg total) by mouth daily. Patient must schedule annual appointment for further refills first attempt 30 tablet 0   sertraline  (ZOLOFT ) 20 MG/ML concentrated solution  Take 25 mg by mouth daily.     spironolactone  (ALDACTONE ) 25 MG tablet 12.5 mg. 1/2 tab daily     sulfaSALAzine  (AZULFIDINE ) 500 MG tablet Take 1,000 mg by mouth 2 (two) times daily.     traMADol  (ULTRAM ) 50 MG tablet Take 1 tablet (50 mg total) by mouth every 12 (twelve) hours as needed for moderate pain. (Patient taking differently: Take 50 mg by  mouth as needed for moderate pain (pain score 4-6).) 60 tablet 2   traZODone  (DESYREL ) 50 MG tablet TAKE 1.5 TABLETS (75 MG TOTAL) BY MOUTH AT BEDTIME AS NEEDED. FOR SLEEP 135 tablet 5   White Petrolatum -Mineral Oil (GENTEAL TEARS NIGHT-TIME OP) Apply to eye at bedtime.     No current facility-administered medications for this visit.    Allergies  Allergen Reactions   Hydroxychloroquine      Other Reaction(s): cardiolmyopathy    Diagnoses:  Major depression  Plan of Care: Outpatient psychotherapy and medication management as needed. Initial Note: (With wife [name]). She says that she has hoped Tomislav would get into therapy. He has Ataxic Tremor. He told Dr. Evonnie that he would like medication to just sit and do nothing. He says he always had golf to rely on and now cannot play. In December, he went n for a surgery to remove an avm from brain. Was told it was a routine surgery and was told that he needs to get it out. Was told it would be a short recovery and he would be back to work. 1 1/2 hour surgery turned into 6 hour surgery and he had complications. He had extra cerebral fluid and Dr. said it would absorb and just wait. He kept declining and by March, they went in to try and drain the extra fluid. He was in hospital for a week. Wife noticed that during the hospital stay, his speech declined and he was clearly weak. Nurses were trying to discharge him and she protested, but they sent him home anyway. He declined, and could not sit, stand, talk or walk and she ended up calling ambulance. In ER, she ws told he had a stroke. He was in ER 24 hours before getting a room in Neuro area. Head of stroke team came in and said it was not a stroke, but complications from surgery. She was blamed by medical team for not speaking up before being initially discharged. He ended up in a rehab for 2 weeks and told them he needed another surgery. Was told he had hydrocephalus and need a shunt to drain liquid off his  brain. She felt a lot of pressure from team that he needed surgery. She wanted a second opinion and had rouble getting it. Spoke to another Dr. and they decided to get the third surgery. He was in OT,PT and Speech Therapy. During therapy, he started having a tremor. They ended up being referred to Dr. Evonnie, who has now told him that treating his tremor is tricky and will take time.  Myrtle is no longer in OT,PT speech therapy. He is supposed to be doing exercises on his own, but has not been emotionally able to ge himself to do it. He has persistent headaches as well. Just had a CAT Scan and awaiting the results. His headaches are worse and they are concerned. He also has rhumatory arthritis and it is flaring up because he had to stop meds. He has cardiomyopathy, macular degeneration on top of all of these other conditions.  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 He is still employed with insurance, but they fear he may be let go. He has been approved for disability and are working to get social security disability. She works part time. They have 2 daughters, one with diabetes and arthritis. She is heading to [employer] this Fall and wants to work in Research scientist (physical sciences). Other daughter is 102 and is our introvert with lots of anxiety. She is in therapy and has mental health issues that is being treated. She will be a Holiday representative at the middle college at [employer].  They are told that medicine will not control his condition, but they will have to try to retrain his brain. Suggested that they work to get back into OT and PT. She has a hard time motivating him. He admits that he keeps things inside. She says he is not a good comunicator. He will not share things like if he is in pain or hurts himself.   Tx. Plan/Goals: Patient reports depressive symptoms secondary to the medical trauma he has suffered and the debilitating physical results. He is seeking therapy to resolve his feelings of helplessness and hopelessness. Also  want assistance in improving his drive/motivation in daily activity. Will engage in therapy that utilizes behavioral approaches to mitigate symptoms along with insight oriented counseling. Will also incorporate some conjoint sessions with spouse as needed, given her integral involvement with his care. Patient agrees with plan for treatment. Goal Date is 12-25    Patient agrees to be seen in video (caregility) session and is aware of the limitations of this platform. He is at home and provider in home office. Session Note:  Patient states that he and wife went to friends pool over weekend and it was enjoyable. He talked about being bored that not much is going on in his life. He claims that moods are steady with no significant changes, No Particular plans moving forward. Will focus upcoming sessions on establishing a nurturing and more positive outlook toward himself                                                                                                                               CONI ALM KERNS, PhD 2:10p-3:00p 50 minutes  [facility] Counselor Initial Adult Exam  Name: Christopher Yu Date: 01/20/2024 MRN: [id number] DOB: Oct 14, 1960 PCP: Dwight Trula SQUIBB, MD    Guardian/Payee:  N/A    Paperwork requested: No   Reason for Visit [name] Problem: Adjustment to serious medical  illness  Mental Status Exam: Appearance:   Casual     Behavior:  Appropriate  Motor:  Shuffling Gait  Speech/Language:   Slurred  Affect:  Depressed  Mood:  depressed  Thought process:  normal  Thought content:    WNL  Sensory/Perceptual disturbances:    WNL  Orientation:  oriented to person, place, and situation  Attention:  Good  Concentration:  Good  Memory:  WNL  Fund of knowledge:   Good  Insight:    Good  Judgment:   unknown  Impulse Control:  unknown    Reported Symptoms:  Sadness, helplessness and hopelessness  Risk Assessment: Danger to Self:  No Self-injurious Behavior: No Danger to Others: No Duty to Warn:no Physical Aggression / Violence:No  Access to Firearms a concern: Unknown Gang Involvement:No  Patient / guardian was educated about steps to take if suicide or homicide risk level increases between visits: n/a While future psychiatric events cannot be accurately predicted, the patient does not currently require acute inpatient psychiatric care and does not currently meet [place]  involuntary commitment criteria.  Substance Abuse History: Current substance abuse: No     Past Psychiatric History:   No previous psychological problems have been observed Outpatient Providers:N/A History of Psych Hospitalization: No  Psychological Testing: none   Abuse History:  Victim of:  N/A  Report needed: No. Victim of Neglect:No. Perpetrator of N/A  Witness / Exposure to Domestic Violence: No   Protective Services Involvement: No  Witness to [employer] Violence:  No   Family History:  Family History  Problem Relation Age of Onset   Other Father        MVA   Lupus Sister     Living situation: the patient lives with their family  Sexual Orientation: Straight  Relationship Status: married  Name of spouse [name] If a parent, number of children / ages:Two girls, ages 54 and 64  Support Systems: spouse  Surveyor, quantity Stress:  Yes    Income/Employment/Disability: Software engineer: unknown  Educational History: Education: unknown  Oncologist: unknown  Any cultural differences that may affect / interfere with treatment:  not applicable   Recreation/Hobbies: golfing  Stressors: Financial difficulties   Health problems    Strengths: Family  Barriers:  severe physical limitations that compromises activity   Legal History: Pending legal issue / charges: The patient has no significant history of legal issues. History of legal issue / charges: N/A  Medical History/Surgical History: reviewed Past Medical History:  Diagnosis Date   Anxiety    Arthritis, rheumatoid (HCC)    dx 1988   CAD (coronary artery disease), native coronary artery    Mild LAD disease with calcification noted at cath 12//27/16    Cardiomyopathy (HCC)    Cardiomyopathy (HCC)    Chronic systolic heart failure (HCC) 05/01/2015   Depression    Deviated nasal septum 06/25/2011   Headache    High cholesterol    Hyperlipidemia    Impingement syndrome of left shoulder 12/22/2017   Macular degeneration    Nausea and vomiting 08/28/2022   Rheumatoid aortitis    Sleep apnea    does not use cpap - UNABLE TO TOLERATE MASK   Sleep apnea  in adult    deviated septum repaired, most recent sleep study was negative   Status post total right knee replacement 06/01/2015    Past Surgical History:  Procedure Laterality Date   ANKLE FUSION Right 05/05/2012   related to his arthritis   ANKLE FUSION Left 05/25/2013   related to his arthritis   ANKLE SURGERY Bilateral    APPLICATION OF CRANIAL NAVIGATION N/A 04/11/2022   Procedure: APPLICATION OF CRANIAL NAVIGATION;  Surgeon: Lanis Pupa, MD;  [facility];  Service: Neurosurgery;  Laterality: N/A;   APPLICATION OF CRANIAL NAVIGATION N/A 08/08/2022   Procedure: APPLICATION OF CRANIAL NAVIGATION;  Surgeon: Lanis Pupa, MD;  [facility];   Service: Neurosurgery;  Laterality: N/A;   BIOPSY  06/09/2023   Procedure: BIOPSY;  Surgeon: Saintclair Jasper, MD;  [facility];  Service: Gastroenterology;;   CARDIAC CATHETERIZATION N/A 05/01/2015   Procedure: Left Heart Cath and Coronary Angiography;  Surgeon: Victory LELON Sharps, MD;  Location: [facility] INVASIVE CV LAB;  Service: Cardiovascular;  Laterality: N/A;   COLONOSCOPY WITH PROPOFOL  N/A 06/09/2023   Procedure: COLONOSCOPY WITH PROPOFOL ;  Surgeon: Saintclair Jasper, MD;  [facility];  Service: Gastroenterology;  Laterality: N/A;   CRANIOTOMY N/A 04/11/2022   Procedure: STEREOTACTIC SUBOCCIPITAL CRANIOTOMY FOR RESECTION OF ARTERIO-VENOUS MALFORMATION;  Surgeon: Lanis Pupa, MD;  [facility];  Service: Neurosurgery;  Laterality: N/A;   ESOPHAGOGASTRODUODENOSCOPY (EGD) WITH PROPOFOL  N/A 06/09/2023   Procedure: ESOPHAGOGASTRODUODENOSCOPY (EGD) WITH PROPOFOL ;  Surgeon: Saintclair Jasper, MD;  [facility];  Service: Gastroenterology;  Laterality: N/A;   FRACTURE SURGERY     left femur fracture x 3   HEMOSTASIS CLIP PLACEMENT  06/09/2023   Procedure: HEMOSTASIS CLIP PLACEMENT;  Surgeon: Saintclair Jasper, MD;  [facility];  Service: Gastroenterology;;   IR ANGIO EXTERNAL CAROTID SEL EXT CAROTID BILAT MOD SED  01/28/2022   IR ANGIO INTRA EXTRACRAN SEL INTERNAL CAROTID BILAT MOD SED  01/28/2022   IR ANGIO INTRA EXTRACRAN SEL INTERNAL CAROTID BILAT MOD SED  04/16/2022   IR ANGIO VERTEBRAL SEL VERTEBRAL BILAT MOD SED  01/28/2022   IR ANGIO VERTEBRAL SEL VERTEBRAL BILAT MOD SED  04/16/2022   KNEE ARTHROSCOPY     right x 4   LAPAROSCOPIC REVISION VENTRICULAR-PERITONEAL (V-P) SHUNT N/A 08/08/2022   Procedure: LAPAROSCOPIC VENTRICULAR-PERITONEAL (V-P) SHUNT;  Surgeon: Vanderbilt Ned, MD;  [facility];  Service: General;  Laterality: N/A;   LUMBAR LAMINECTOMY/DECOMPRESSION MICRODISCECTOMY N/A 07/18/2022   Procedure: Repair of Pseudomeningiocele posterior;  Surgeon: Lanis Pupa, MD;  [facility];  Service: Neurosurgery;  Laterality: N/A;   NASAL SEPTOPLASTY W/ TURBINOPLASTY  06/25/2011   Procedure: NASAL SEPTOPLASTY WITH TURBINATE REDUCTION;  Surgeon: Alm Bouche, MD;  Location: [facility] OR;  Service: ENT;  Laterality: Bilateral;   PLACEMENT OF LUMBAR DRAIN N/A 07/18/2022   Procedure: PLACEMENT OF LUMBAR DRAIN;  Surgeon: Lanis Pupa, MD;  [facility];  Service: Neurosurgery;  Laterality: N/A;   POLYPECTOMY  06/09/2023   Procedure: POLYPECTOMY;  Surgeon: Saintclair Jasper, MD;  Location: [facility];  Service: Gastroenterology;;   TONSILLECTOMY     TOTAL KNEE ARTHROPLASTY Right 06/01/2015   Procedure: RIGHT TOTAL KNEE ARTHROPLASTY;  Surgeon: Lonni CINDERELLA Poli, MD;  [facility];  Service: Orthopedics;  Laterality: Right;  Block+general   VENTRICULOPERITONEAL SHUNT Right 08/08/2022   Procedure: LAP ASSITED SHUNT INSERTION VENTRICULAR-PERITONEAL;  Surgeon: Lanis Pupa, MD;  [facility];  Service: Neurosurgery;  Laterality: Right;    Medications: Current Outpatient Medications  Medication  Sig Dispense Refill   acetaminophen  (TYLENOL ) 325 MG tablet Take 2 tablets (650 mg total) by mouth 3 (three) times daily.     alendronate (FOSAMAX) 10 MG tablet Take 10 mg by mouth daily before breakfast. Take with a full glass of water  on an empty stomach.     artificial tears (LACRILUBE) OINT ophthalmic ointment Place into both eyes 2 (two) times daily.     aspirin  EC 81 MG tablet Take 1 tablet (81 mg total) by mouth daily. Swallow whole. 30 tablet 12   calcium  carbonate (TUMS - DOSED IN MG ELEMENTAL CALCIUM ) 500 MG chewable tablet Chew 1 tablet (200 mg of elemental calcium  total) by mouth daily with supper.     Cholecalciferol (VITAMIN D3) 50 MCG (2000 UT) TABS Take 2,000 Units by mouth daily.     famotidine (PEPCID) 40 MG tablet 1 tablet Orally Once a day for 30 days     folic acid  (FOLVITE ) 1 MG tablet Take 1 mg by mouth daily.     methotrexate   (RHEUMATREX) 2.5 MG tablet Take 20 mg by mouth once a week. Takes 8 tablets(20mg ) every Saturday.     Caution:Chemotherapy. Protect from light.     metoprolol  succinate (TOPROL -XL) 25 MG 24 hr tablet TAKE 1 TABLET DAILY 90 tablet 3   Multiple Vitamins-Minerals (PRESERVISION AREDS 2 PO) Take 2 capsules by mouth daily.     predniSONE  (STERAPRED UNI-PAK 21 TAB) 5 MG (21) TBPK tablet Take 5 mg by mouth every morning.     RITUXAN  500 MG/50ML injection See admin instructions. Every 6 months 2 dose infusion Ruxience      rosuvastatin  (CRESTOR ) 20 MG tablet Take 1 tablet (20 mg total) by mouth daily. Patient must schedule annual appointment for further refills first attempt 30 tablet 0   sertraline  (ZOLOFT ) 20 MG/ML concentrated solution Take 25 mg by mouth daily.     spironolactone  (ALDACTONE ) 25 MG tablet 12.5 mg. 1/2 tab daily     sulfaSALAzine  (AZULFIDINE ) 500 MG tablet Take 1,000 mg by mouth 2 (two) times daily.     traMADol  (ULTRAM ) 50 MG tablet Take 1 tablet (50 mg total) by mouth every 12 (twelve) hours as needed for moderate pain. (Patient taking differently: Take 50 mg by mouth as needed for moderate pain (pain score 4-6).) 60 tablet 2   traZODone  (DESYREL ) 50 MG tablet TAKE 1.5 TABLETS (75 MG TOTAL) BY MOUTH AT BEDTIME AS NEEDED. FOR SLEEP 135 tablet 5   White Petrolatum -Mineral Oil (GENTEAL TEARS NIGHT-TIME OP) Apply to eye at bedtime.     No current facility-administered medications for this visit.    Allergies  Allergen Reactions   Hydroxychloroquine      Other Reaction(s): cardiolmyopathy    Diagnoses:  Major depression  Plan of Care: Outpatient psychotherapy and medication management as needed. Initial Note: (With wife [name]). She says that she has hoped Maciej would get into therapy. He has Ataxic Tremor. He told Dr. Evonnie that he would like medication to just sit and do nothing. He says he always had golf to rely on and now cannot play. In December, he went n for a surgery to remove  an avm from brain. Was told it was a routine surgery and was told that he needs to get it out. Was told it would be a short recovery and he would be back to work. 1 1/2 hour surgery turned into 6 hour surgery and he had complications. He had extra cerebral fluid and Dr. said it would absorb  and just wait. He kept declining and by March, they went in to try and drain the extra fluid. He was in hospital for a week. Wife noticed that during the hospital stay, his speech declined and he was clearly weak. Nurses were trying to discharge him and she protested, but they sent him home anyway. He declined, and could not sit, stand, talk or walk and she ended up calling ambulance. In ER, she ws told he had a stroke. He was in ER 24 hours before getting a room in Neuro area. Head of stroke team came in and said it was not a stroke, but complications from surgery. She was blamed by medical team for not speaking up before being initially discharged. He ended up in a rehab for 2 weeks and told them he needed another surgery. Was told he had hydrocephalus and need a shunt to drain liquid off his brain. She felt a lot of pressure from team that he needed surgery. She wanted a second opinion and had rouble getting it. Spoke to another Dr. and they decided to get the third surgery. He was in OT,PT and Speech Therapy. During therapy, he started having a tremor. They ended up being referred to Dr. Evonnie, who has now told him that treating his tremor is tricky and will take time.  Carlito is no longer in OT,PT speech therapy. He is supposed to be doing exercises on his own, but has not been emotionally able to ge himself to do it. He has persistent headaches as well. Just had a CAT Scan and awaiting the results. His headaches are worse and they are concerned. He also has rhumatory arthritis and it is flaring up because he had to stop meds. He has cardiomyopathy, macular degeneration on top of all of these other conditions.  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 He has been approved for disability and are working to get social security disability. She works part time. They have 2 daughters, one with diabetes and arthritis. She is heading to [employer] this Fall and wants to work in Research scientist (physical sciences). Other daughter is 44 and is our introvert with lots of anxiety. She is in therapy and has mental health issues that is being treated. She will be a Holiday representative at the middle college at [employer].  They are told that medicine will not control his condition, but they will have to try to retrain his brain. Suggested that they work to get back into OT and PT. She has a hard time motivating him. He admits that he keeps things inside. She says he is not a good comunicator. He will not share things like if he is in pain or hurts himself.   Tx. Plan/Goals: Patient reports depressive symptoms secondary to the medical trauma he has suffered and the debilitating physical results. He is seeking therapy to resolve his feelings of helplessness and hopelessness. Also want assistance in improving his drive/motivation in daily activity. Will engage in therapy that utilizes behavioral approaches to mitigate symptoms along with insight oriented counseling. Will also incorporate some conjoint sessions with spouse as needed, given her integral involvement with his care. Patient agrees with plan for treatment. Goal Date is 12-25    Patient agrees to be seen in video (caregility) session and is aware of the limitations of this platform. He is at home and provider in home office. Session Note:  Patient states that  he completed on of the house projects (heating) and now has another project on the way. He talked about whether or not he wants to stay in his house. He claims that it is his sole decision, given he pays for the house. He does say that he usually agrees to do what his wife wants to keep her  happy. He claims that his wife does not want him or their daughter to drive. She finds excuses to not drive with [employer]imal (for him to practice) or with their daughters, but complains about having to drive everyone everywhere. He does not push her to drive with him because he does not want an argument.                                                                                                                                       CONI ALM KERNS, PhD 4:15p-5:00p 45 minutes

## 2024-01-23 ENCOUNTER — Encounter: Payer: Self-pay | Admitting: Physical Medicine and Rehabilitation

## 2024-01-25 DIAGNOSIS — R2689 Other abnormalities of gait and mobility: Secondary | ICD-10-CM | POA: Diagnosis not present

## 2024-01-27 ENCOUNTER — Ambulatory Visit (INDEPENDENT_AMBULATORY_CARE_PROVIDER_SITE_OTHER): Admitting: Psychology

## 2024-01-27 DIAGNOSIS — F329 Major depressive disorder, single episode, unspecified: Secondary | ICD-10-CM | POA: Diagnosis not present

## 2024-01-27 DIAGNOSIS — F321 Major depressive disorder, single episode, moderate: Secondary | ICD-10-CM

## 2024-01-27 NOTE — Progress Notes (Signed)
 [facility] Counselor Initial Adult Exam  Name: Christopher Yu Date: 01/27/2024 MRN: [id number] DOB: 1960-08-16 PCP: Christopher Trula SQUIBB, MD    Guardian/Payee:  N/A    Paperwork requested: No   Reason for Visit Christopher Yu Problem: Adjustment to serious medical illness  Mental Status Exam: Appearance:   Casual     Behavior:  Appropriate  Motor:  Shuffling Gait  Speech/Language:   Slurred  Affect:  Depressed  Mood:  depressed  Thought process:  normal  Thought content:    WNL  Sensory/Perceptual disturbances:    WNL  Orientation:  oriented to person, place, and situation  Attention:  Good  Concentration:  Good  Memory:  WNL  Fund of knowledge:   Good  Insight:    Good  Judgment:   unknown  Impulse Control:  unknown    Reported Symptoms:  Sadness, helplessness and hopelessness  Risk Assessment: Danger to Self:  No Self-injurious Behavior: No Danger to Others: No Duty to Warn:no Physical Aggression / Violence:No  Access to Firearms a concern: Unknown Gang Involvement:No  Patient / guardian was educated about steps to take if suicide or homicide risk level increases between visits: n/a While future psychiatric events cannot be accurately predicted, the patient does not currently require acute inpatient psychiatric care and does not currently meet [place]   involuntary commitment criteria.  Substance Abuse History: Current substance abuse: No     Past Psychiatric History:   No previous psychological problems have been observed Outpatient Providers:N/A History of Psych Hospitalization: No  Psychological Testing: none   Abuse History:  Victim of:  N/A  Report needed: No. Victim of Neglect:No. Perpetrator of N/A  Witness / Exposure to Domestic Violence: No  Protective Services Involvement: No  Witness to Community Violence:  No   Family History:  Family History  Problem Relation Age of Onset   Other Father        MVA   Lupus Sister     Living situation: the patient lives with their family  Sexual Orientation: Straight  Relationship Status: married  Name of spouse Christopher Yu If a parent, number of children / ages:Two girls, ages 85 and 40  Support Systems: spouse  Surveyor, quantity Stress:  Yes   Income/Employment/Disability: Software engineer: unknown  Educational History: Education: unknown  Oncologist: unknown  Any cultural differences that may affect / interfere with treatment:  not applicable   Recreation/Hobbies: golfing  Stressors: Financial difficulties   Health problems    Strengths: Family  Barriers:  severe physical limitations that compromises activity   Legal History: Pending legal issue / charges: The patient has no significant history of legal issues. History of legal issue / charges: N/A  Medical History/Surgical History: reviewed Past Medical History:  Diagnosis Date   Anxiety    Arthritis, rheumatoid (HCC)    dx 1988   CAD (coronary artery disease), native coronary artery    Mild LAD disease with calcification noted at cath 12//27/16    Cardiomyopathy (HCC)    Cardiomyopathy (HCC)    Chronic systolic heart failure (HCC) 05/01/2015   Depression    Deviated nasal septum 06/25/2011   Headache    High cholesterol    Hyperlipidemia    Impingement  syndrome of left shoulder 12/22/2017   Macular degeneration    Nausea and vomiting 08/28/2022   Rheumatoid aortitis    Sleep apnea    does not use cpap - UNABLE TO TOLERATE MASK   Sleep apnea in adult    deviated septum repaired, most recent sleep study was negative   Status post total right knee replacement 06/01/2015    Past Surgical History:  Procedure Laterality Date   ANKLE FUSION Right 05/05/2012   related to his arthritis   ANKLE FUSION Left 05/25/2013   related to his arthritis   ANKLE SURGERY Bilateral    APPLICATION OF CRANIAL NAVIGATION N/A 04/11/2022   Procedure: APPLICATION OF CRANIAL NAVIGATION;  Surgeon: Lanis Pupa, MD;  [facility];  Service: Neurosurgery;  Laterality: N/A;   APPLICATION OF CRANIAL NAVIGATION N/A 08/08/2022   Procedure: APPLICATION OF CRANIAL NAVIGATION;  Surgeon: Lanis Pupa, MD;  [facility];  Service: Neurosurgery;  Laterality: N/A;   BIOPSY  06/09/2023   Procedure: BIOPSY;  Surgeon: Saintclair Jasper, MD;  [facility];  Service: Gastroenterology;;   CARDIAC CATHETERIZATION N/A 05/01/2015   Procedure: Left Heart Cath and Coronary Angiography;  Surgeon: Victory LELON Sharps, MD;  Location: [facility] INVASIVE CV LAB;  Service: Cardiovascular;  Laterality: N/A;   COLONOSCOPY WITH PROPOFOL  N/A 06/09/2023   Procedure: COLONOSCOPY WITH PROPOFOL ;  Surgeon: Saintclair Jasper, MD;  [facility];  Service: Gastroenterology;  Laterality: N/A;   CRANIOTOMY N/A 04/11/2022   Procedure: STEREOTACTIC SUBOCCIPITAL CRANIOTOMY FOR RESECTION OF ARTERIO-VENOUS MALFORMATION;  Surgeon: Lanis Pupa, MD;  [facility];  Service: Neurosurgery;  Laterality: N/A;   ESOPHAGOGASTRODUODENOSCOPY (EGD) WITH PROPOFOL  N/A 06/09/2023   Procedure: ESOPHAGOGASTRODUODENOSCOPY (EGD) WITH PROPOFOL ;  Surgeon: Saintclair Jasper, MD;  [facility];  Service: Gastroenterology;  Laterality: N/A;   FRACTURE SURGERY     left femur fracture x 3   HEMOSTASIS CLIP PLACEMENT   06/09/2023   Procedure: HEMOSTASIS CLIP PLACEMENT;  Surgeon: Karki, Arya,  MD;  [facility];  Service: Gastroenterology;;   IR ANGIO EXTERNAL CAROTID SEL EXT CAROTID BILAT MOD SED  01/28/2022   IR ANGIO INTRA EXTRACRAN SEL INTERNAL CAROTID BILAT MOD SED  01/28/2022   IR ANGIO INTRA EXTRACRAN SEL INTERNAL CAROTID BILAT MOD SED  04/16/2022   IR ANGIO VERTEBRAL SEL VERTEBRAL BILAT MOD SED  01/28/2022   IR ANGIO VERTEBRAL SEL VERTEBRAL BILAT MOD SED  04/16/2022   KNEE ARTHROSCOPY     right x 4   LAPAROSCOPIC REVISION VENTRICULAR-PERITONEAL (V-P) SHUNT N/A 08/08/2022   Procedure: LAPAROSCOPIC VENTRICULAR-PERITONEAL (V-P) SHUNT;  Surgeon: Vanderbilt Ned, MD;  [facility];  Service: General;  Laterality: N/A;   LUMBAR LAMINECTOMY/DECOMPRESSION MICRODISCECTOMY N/A 07/18/2022   Procedure: Repair of Pseudomeningiocele posterior;  Surgeon: Lanis Pupa, MD;  [facility];  Service: Neurosurgery;  Laterality: N/A;   NASAL SEPTOPLASTY W/ TURBINOPLASTY  06/25/2011   Procedure: NASAL SEPTOPLASTY WITH TURBINATE REDUCTION;  Surgeon: Alm Bouche, MD;  Location: [facility] OR;  Service: ENT;  Laterality: Bilateral;   PLACEMENT OF LUMBAR DRAIN N/A 07/18/2022   Procedure: PLACEMENT OF LUMBAR DRAIN;  Surgeon: Lanis Pupa, MD;  [facility];  Service: Neurosurgery;  Laterality: N/A;   POLYPECTOMY  06/09/2023   Procedure: POLYPECTOMY;  Surgeon: Saintclair Jasper, MD;  Location: [facility];  Service: Gastroenterology;;   TONSILLECTOMY     TOTAL KNEE ARTHROPLASTY Right 06/01/2015   Procedure: RIGHT TOTAL KNEE ARTHROPLASTY;  Surgeon: Lonni CINDERELLA Poli, MD;  [facility];  Service: Orthopedics;  Laterality: Right;  Block+general   VENTRICULOPERITONEAL SHUNT Right 08/08/2022   Procedure: LAP ASSITED SHUNT INSERTION VENTRICULAR-PERITONEAL;  Surgeon: Lanis Pupa, MD;  [facility];  Service: Neurosurgery;  Laterality: Right;    Medications: Current Outpatient Medications  Medication  Sig Dispense Refill   acetaminophen  (TYLENOL ) 325 MG tablet Take 2 tablets (650 mg total) by mouth 3 (three) times daily.     alendronate (FOSAMAX) 10 MG tablet Take 10 mg by mouth daily before breakfast. Take with a full glass of water  on an empty stomach.     artificial tears (LACRILUBE) OINT ophthalmic ointment Place into both eyes 2 (two) times daily.     aspirin  EC 81 MG tablet Take 1 tablet (81 mg total) by mouth daily. Swallow whole. 30 tablet 12   calcium  carbonate (TUMS - DOSED IN MG ELEMENTAL CALCIUM ) 500 MG chewable tablet Chew 1 tablet (200 mg of elemental calcium  total) by mouth daily with supper.     Cholecalciferol (VITAMIN D3) 50 MCG (2000 UT) TABS Take 2,000 Units by mouth daily.     famotidine (PEPCID) 40 MG tablet 1 tablet Orally Once a day for 30 days     folic acid  (FOLVITE ) 1 MG tablet Take 1 mg by mouth daily.     methotrexate  (RHEUMATREX) 2.5 MG tablet Take 20 mg by mouth once a week. Takes 8 tablets(20mg ) every Saturday.     Caution:Chemotherapy. Protect from light.     metoprolol  succinate (TOPROL -XL) 25 MG 24 hr tablet TAKE 1 TABLET DAILY 90 tablet 3   Multiple Vitamins-Minerals (PRESERVISION AREDS 2 PO) Take 2 capsules by mouth daily.     predniSONE  (STERAPRED UNI-PAK 21 TAB) 5 MG (21) TBPK tablet Take 5 mg by mouth every morning.     RITUXAN  500 MG/50ML injection See admin instructions. Every 6 months 2 dose infusion Ruxience      rosuvastatin  (CRESTOR ) 20 MG tablet Take 1 tablet (20 mg total) by mouth daily. Patient must schedule annual appointment  for further refills first attempt 30 tablet 0   sertraline  (ZOLOFT ) 20 MG/ML concentrated solution Take 25 mg by mouth daily.     spironolactone  (ALDACTONE ) 25 MG tablet 12.5 mg. 1/2 tab daily     sulfaSALAzine  (AZULFIDINE ) 500 MG tablet Take 1,000 mg by mouth 2 (two) times daily.     traMADol  (ULTRAM ) 50 MG tablet Take 1 tablet (50 mg total) by mouth every 12 (twelve) hours as needed for moderate pain. (Patient taking  differently: Take 50 mg by mouth as needed for moderate pain (pain score 4-6).) 60 tablet 2   traZODone  (DESYREL ) 50 MG tablet TAKE 1.5 TABLETS (75 MG TOTAL) BY MOUTH AT BEDTIME AS NEEDED. FOR SLEEP 135 tablet 5   White Petrolatum -Mineral Oil (GENTEAL TEARS NIGHT-TIME OP) Apply to eye at bedtime.     No current facility-administered medications for this visit.    Allergies  Allergen Reactions   Hydroxychloroquine      Other Reaction(s): cardiolmyopathy    Diagnoses:  Major depression  Plan of Care: Outpatient psychotherapy and medication management as needed. Initial Note: (With wife Christopher Yu). She says that she has hoped Catrell would get into therapy. He has Ataxic Tremor. He told Dr. Evonnie that he would like medication to just sit and do nothing. He says he always had golf to rely on and now cannot play. In December, he went n for a surgery to remove an avm from brain. Was told it was a routine surgery and was told that he needs to get it out. Was told it would be a short recovery and he would be back to work. 1 1/2 hour surgery turned into 6 hour surgery and he had complications. He had extra cerebral fluid and Dr. said it would absorb and just wait. He kept declining and by March, they went in to try and drain the extra fluid. He was in hospital for a week. Wife noticed that during the hospital stay, his speech declined and he was clearly weak. Nurses were trying to discharge him and she protested, but they sent him home anyway. He declined, and could not sit, stand, talk or walk and she ended up calling ambulance. In ER, she ws told he had a stroke. He was in ER 24 hours before getting a room in Neuro area. Head of stroke team came in and said it was not a stroke, but complications from surgery. She was blamed by medical team for not speaking up before being initially discharged. He ended up in a rehab for 2 weeks and told them he needed another surgery. Was told he had hydrocephalus and need a  shunt to drain liquid off his brain. She felt a lot of pressure from team that he needed surgery. She wanted a second opinion and had rouble getting it. Spoke to another Dr. and they decided to get the third surgery. He was in OT,PT and Speech Therapy. During therapy, he started having a tremor. They ended up being referred to Dr. Evonnie, who has now told him that treating his tremor is tricky and will take time.  Christopher Yu is no longer in OT,PT speech therapy. He is supposed to be doing exercises on his own, but has not been emotionally able to ge himself to do it. He has persistent headaches as well. Just had a CAT Scan and awaiting the results. His headaches are worse and they are concerned. He also has rhumatory arthritis and it is flaring up because he had to stop meds.  He has cardiomyopathy, macular degeneration on top of all of these other conditions.  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 He has been approved for disability and are working to get social security disability. She works part time. They have 2 daughters, one with diabetes and arthritis. She is heading to [employer] this Fall and wants to work in Research scientist (physical sciences). Other daughter is 83 and is our introvert with lots of anxiety. She is in therapy and has mental health issues that is being treated. She will be a Holiday representative at the middle college at [employer].  They are told that medicine will not control his condition, but they will have to try to retrain his brain. Suggested that they work to get back into OT and PT. She has a hard time motivating him. He admits that he keeps things inside. She says he is not a good comunicator. He will not share things like if he is in pain or hurts himself.   Tx. Plan/Goals: Patient reports depressive symptoms secondary to the medical trauma he has suffered and the debilitating physical results. He is seeking therapy to resolve his feelings of  helplessness and hopelessness. Also want assistance in improving his drive/motivation in daily activity. Will engage in therapy that utilizes behavioral approaches to mitigate symptoms along with insight oriented counseling. Will also incorporate some conjoint sessions with spouse as needed, given her integral involvement with his care. Patient agrees with plan for treatment. Goal Date is 12-25    Patient agrees to be seen in video (caregility) session and is aware of the limitations of this platform. He is at home and provider in home office. Session Note:  Patient states that he and wife went to friends pool over weekend and it was enjoyable. He talked about being bored that not much is going on in his life. He claims that moods are steady with no significant changes, No Particular plans moving forward. Will focus upcoming sessions on establishing a nurturing and more positive outlook toward himself                                                                                                                               CONI ALM KERNS, PhD 2:10p-3:00p 50 minutes  [facility] Counselor Initial Adult Exam  Name: Christopher Yu Date: 01/27/2024 MRN: [id number] DOB: February 02, 1961 PCP: Christopher Trula SQUIBB, MD    Guardian/Payee:  N/A    Paperwork requested: No   Reason for Visit Christopher Yu Problem:  Adjustment to serious medical illness  Mental Status Exam: Appearance:   Casual     Behavior:  Appropriate  Motor:  Shuffling Gait  Speech/Language:   Slurred  Affect:  Depressed  Mood:  depressed  Thought process:  normal  Thought content:    WNL  Sensory/Perceptual disturbances:    WNL  Orientation:  oriented to person, place, and situation  Attention:  Good  Concentration:  Good  Memory:  WNL  Fund of knowledge:   Good  Insight:    Good  Judgment:   unknown  Impulse Control:  unknown    Reported Symptoms:  Sadness, helplessness and hopelessness  Risk Assessment: Danger to Self:  No Self-injurious Behavior: No Danger to Others: No Duty to Warn:no Physical Aggression / Violence:No  Access to Firearms a concern: Unknown Gang Involvement:No  Patient / guardian was educated about steps to take if suicide or homicide risk level increases between visits: n/a While future psychiatric events cannot be accurately predicted, the patient does not currently require acute inpatient psychiatric care and does not currently meet [place]  involuntary commitment criteria.  Substance Abuse History: Current substance abuse: No     Past Psychiatric History:   No previous psychological problems have been observed Outpatient Providers:N/A History of Psych Hospitalization: No  Psychological Testing: none   Abuse History:  Victim of:  N/A  Report needed: No. Victim of Neglect:No. Perpetrator of N/A  Witness / Exposure to Domestic Violence: No   Protective Services Involvement: No  Witness to [employer] Violence:  No   Family History:  Family History  Problem Relation Age of Onset   Other Father        MVA   Lupus Sister     Living situation: the patient lives with their family  Sexual Orientation: Straight  Relationship Status: married  Name of spouse Christopher Yu If a parent, number of children / ages:Two girls, ages 40 and 76  Support Systems: spouse  Surveyor, quantity Stress:   Yes   Income/Employment/Disability: Software engineer: unknown  Educational History: Education: unknown  Oncologist: unknown  Any cultural differences that may affect / interfere with treatment:  not applicable   Recreation/Hobbies: golfing  Stressors: Financial difficulties   Health problems    Strengths: Family  Barriers:  severe physical limitations that compromises activity   Legal History: Pending legal issue / charges: The patient has no significant history of legal issues. History of legal issue / charges: N/A  Medical History/Surgical History: reviewed Past Medical History:  Diagnosis Date   Anxiety    Arthritis, rheumatoid (HCC)    dx 1988   CAD (coronary artery disease), native coronary artery    Mild LAD disease with calcification noted at cath 12//27/16    Cardiomyopathy (HCC)    Cardiomyopathy (HCC)    Chronic systolic heart failure (HCC) 05/01/2015   Depression    Deviated nasal septum 06/25/2011   Headache    High cholesterol    Hyperlipidemia    Impingement syndrome of left shoulder 12/22/2017   Macular degeneration    Nausea and vomiting 08/28/2022   Rheumatoid aortitis    Sleep apnea    does not use cpap - UNABLE TO TOLERATE MASK   Sleep apnea  in adult    deviated septum repaired, most recent sleep study was negative   Status post total right knee replacement 06/01/2015    Past Surgical History:  Procedure Laterality Date   ANKLE FUSION Right 05/05/2012   related to his arthritis   ANKLE FUSION Left 05/25/2013   related to his arthritis   ANKLE SURGERY Bilateral    APPLICATION OF CRANIAL NAVIGATION N/A 04/11/2022   Procedure: APPLICATION OF CRANIAL NAVIGATION;  Surgeon: Lanis Pupa, MD;  [facility];  Service: Neurosurgery;  Laterality: N/A;   APPLICATION OF CRANIAL NAVIGATION N/A 08/08/2022   Procedure: APPLICATION OF CRANIAL NAVIGATION;  Surgeon: Lanis Pupa, MD;  [facility];  Service: Neurosurgery;  Laterality: N/A;   BIOPSY  06/09/2023   Procedure: BIOPSY;  Surgeon: Saintclair Jasper, MD;  [facility];  Service: Gastroenterology;;   CARDIAC CATHETERIZATION N/A 05/01/2015   Procedure: Left Heart Cath and Coronary Angiography;  Surgeon: Victory LELON Sharps, MD;  Location: [facility] INVASIVE CV LAB;  Service: Cardiovascular;  Laterality: N/A;   COLONOSCOPY WITH PROPOFOL  N/A 06/09/2023   Procedure: COLONOSCOPY WITH PROPOFOL ;  Surgeon: Saintclair Jasper, MD;  [facility];  Service: Gastroenterology;  Laterality: N/A;   CRANIOTOMY N/A 04/11/2022   Procedure: STEREOTACTIC SUBOCCIPITAL CRANIOTOMY FOR RESECTION OF ARTERIO-VENOUS MALFORMATION;  Surgeon: Lanis Pupa, MD;  [facility];  Service: Neurosurgery;  Laterality: N/A;   ESOPHAGOGASTRODUODENOSCOPY (EGD) WITH PROPOFOL  N/A 06/09/2023   Procedure: ESOPHAGOGASTRODUODENOSCOPY (EGD) WITH PROPOFOL ;  Surgeon: Saintclair Jasper, MD;  [facility];  Service: Gastroenterology;  Laterality: N/A;   FRACTURE SURGERY     left femur fracture x 3   HEMOSTASIS CLIP PLACEMENT  06/09/2023   Procedure: HEMOSTASIS CLIP PLACEMENT;  Surgeon: Saintclair Jasper, MD;  [facility];  Service: Gastroenterology;;   IR ANGIO EXTERNAL CAROTID SEL EXT CAROTID BILAT MOD SED  01/28/2022   IR ANGIO INTRA EXTRACRAN SEL INTERNAL CAROTID BILAT MOD SED  01/28/2022   IR ANGIO INTRA EXTRACRAN SEL INTERNAL CAROTID BILAT MOD SED  04/16/2022   IR ANGIO VERTEBRAL SEL VERTEBRAL BILAT MOD SED  01/28/2022   IR ANGIO VERTEBRAL SEL VERTEBRAL BILAT MOD SED  04/16/2022   KNEE ARTHROSCOPY     right x 4   LAPAROSCOPIC REVISION VENTRICULAR-PERITONEAL (V-P) SHUNT N/A 08/08/2022   Procedure: LAPAROSCOPIC VENTRICULAR-PERITONEAL (V-P) SHUNT;  Surgeon: Vanderbilt Ned, MD;  [facility];  Service: General;  Laterality: N/A;   LUMBAR LAMINECTOMY/DECOMPRESSION MICRODISCECTOMY N/A 07/18/2022   Procedure: Repair of Pseudomeningiocele posterior;  Surgeon: Lanis Pupa, MD;  [facility];  Service: Neurosurgery;  Laterality: N/A;   NASAL SEPTOPLASTY W/ TURBINOPLASTY  06/25/2011   Procedure: NASAL SEPTOPLASTY WITH TURBINATE REDUCTION;  Surgeon: Alm Bouche, MD;  Location: [facility] OR;  Service: ENT;  Laterality: Bilateral;   PLACEMENT OF LUMBAR DRAIN N/A 07/18/2022   Procedure: PLACEMENT OF LUMBAR DRAIN;  Surgeon: Lanis Pupa, MD;  [facility];  Service: Neurosurgery;  Laterality: N/A;   POLYPECTOMY  06/09/2023   Procedure: POLYPECTOMY;  Surgeon: Saintclair Jasper, MD;  Location: [facility];  Service: Gastroenterology;;   TONSILLECTOMY     TOTAL KNEE ARTHROPLASTY Right 06/01/2015   Procedure: RIGHT TOTAL KNEE ARTHROPLASTY;  Surgeon: Lonni CINDERELLA Poli, MD;  [facility];  Service: Orthopedics;  Laterality: Right;  Block+general   VENTRICULOPERITONEAL SHUNT Right 08/08/2022   Procedure: LAP ASSITED SHUNT INSERTION VENTRICULAR-PERITONEAL;  Surgeon: Lanis Pupa, MD;  [facility];  Service: Neurosurgery;  Laterality: Right;    Medications: Current Outpatient Medications  Medication  Sig Dispense Refill   acetaminophen  (TYLENOL ) 325 MG tablet Take 2 tablets (650 mg total) by mouth 3 (three) times daily.     alendronate (FOSAMAX) 10 MG tablet Take 10 mg by mouth daily before breakfast. Take with a full glass of water  on an empty stomach.     artificial tears (LACRILUBE) OINT ophthalmic ointment Place into both eyes 2 (two) times daily.     aspirin  EC 81 MG tablet Take 1 tablet (81 mg total) by mouth daily. Swallow whole. 30 tablet 12   calcium  carbonate (TUMS - DOSED IN MG ELEMENTAL CALCIUM ) 500 MG chewable tablet Chew 1 tablet (200 mg of elemental calcium  total) by mouth daily with supper.     Cholecalciferol (VITAMIN D3) 50 MCG (2000 UT) TABS Take 2,000 Units by mouth daily.     famotidine (PEPCID) 40 MG tablet 1 tablet Orally Once a day for 30 days     folic acid  (FOLVITE ) 1 MG tablet Take 1 mg by mouth daily.     methotrexate   (RHEUMATREX) 2.5 MG tablet Take 20 mg by mouth once a week. Takes 8 tablets(20mg ) every Saturday.     Caution:Chemotherapy. Protect from light.     metoprolol  succinate (TOPROL -XL) 25 MG 24 hr tablet TAKE 1 TABLET DAILY 90 tablet 3   Multiple Vitamins-Minerals (PRESERVISION AREDS 2 PO) Take 2 capsules by mouth daily.     predniSONE  (STERAPRED UNI-PAK 21 TAB) 5 MG (21) TBPK tablet Take 5 mg by mouth every morning.     RITUXAN  500 MG/50ML injection See admin instructions. Every 6 months 2 dose infusion Ruxience      rosuvastatin  (CRESTOR ) 20 MG tablet Take 1 tablet (20 mg total) by mouth daily. Patient must schedule annual appointment for further refills first attempt 30 tablet 0   sertraline  (ZOLOFT ) 20 MG/ML concentrated solution Take 25 mg by mouth daily.     spironolactone  (ALDACTONE ) 25 MG tablet 12.5 mg. 1/2 tab daily     sulfaSALAzine  (AZULFIDINE ) 500 MG tablet Take 1,000 mg by mouth 2 (two) times daily.     traMADol  (ULTRAM ) 50 MG tablet Take 1 tablet (50 mg total) by mouth every 12 (twelve) hours as needed for moderate pain. (Patient taking differently: Take 50 mg by mouth as needed for moderate pain (pain score 4-6).) 60 tablet 2   traZODone  (DESYREL ) 50 MG tablet TAKE 1.5 TABLETS (75 MG TOTAL) BY MOUTH AT BEDTIME AS NEEDED. FOR SLEEP 135 tablet 5   White Petrolatum -Mineral Oil (GENTEAL TEARS NIGHT-TIME OP) Apply to eye at bedtime.     No current facility-administered medications for this visit.    Allergies  Allergen Reactions   Hydroxychloroquine      Other Reaction(s): cardiolmyopathy    Diagnoses:  Major depression  Plan of Care: Outpatient psychotherapy and medication management as needed. Initial Note: (With wife Christopher Yu). She says that she has hoped Duaine would get into therapy. He has Ataxic Tremor. He told Dr. Evonnie that he would like medication to just sit and do nothing. He says he always had golf to rely on and now cannot play. In December, he went n for a surgery to remove  an avm from brain. Was told it was a routine surgery and was told that he needs to get it out. Was told it would be a short recovery and he would be back to work. 1 1/2 hour surgery turned into 6 hour surgery and he had complications. He had extra cerebral fluid and Dr. said it would absorb  and just wait. He kept declining and by March, they went in to try and drain the extra fluid. He was in hospital for a week. Wife noticed that during the hospital stay, his speech declined and he was clearly weak. Nurses were trying to discharge him and she protested, but they sent him home anyway. He declined, and could not sit, stand, talk or walk and she ended up calling ambulance. In ER, she ws told he had a stroke. He was in ER 24 hours before getting a room in Neuro area. Head of stroke team came in and said it was not a stroke, but complications from surgery. She was blamed by medical team for not speaking up before being initially discharged. He ended up in a rehab for 2 weeks and told them he needed another surgery. Was told he had hydrocephalus and need a shunt to drain liquid off his brain. She felt a lot of pressure from team that he needed surgery. She wanted a second opinion and had rouble getting it. Spoke to another Dr. and they decided to get the third surgery. He was in OT,PT and Speech Therapy. During therapy, he started having a tremor. They ended up being referred to Dr. Evonnie, who has now told him that treating his tremor is tricky and will take time.  Nyaire is no longer in OT,PT speech therapy. He is supposed to be doing exercises on his own, but has not been emotionally able to ge himself to do it. He has persistent headaches as well. Just had a CAT Scan and awaiting the results. His headaches are worse and they are concerned. He also has rhumatory arthritis and it is flaring up because he had to stop meds. He has cardiomyopathy, macular degeneration on top of all of these other conditions.  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 He has been approved for disability and are working to get social security disability. She works part time. They have 2 daughters, one with diabetes and arthritis. She is heading to [employer] this Fall and wants to work in Research scientist (physical sciences). Other daughter is 52 and is our introvert with lots of anxiety. She is in therapy and has mental health issues that is being treated. She will be a Holiday representative at the middle college at [employer].  They are told that medicine will not control his condition, but they will have to try to retrain his brain. Suggested that they work to get back into OT and PT. She has a hard time motivating him. He admits that he keeps things inside. She says he is not a good comunicator. He will not share things like if he is in pain or hurts himself.   Tx. Plan/Goals: Patient reports depressive symptoms secondary to the medical trauma he has suffered and the debilitating physical results. He is seeking therapy to resolve his feelings of helplessness and hopelessness. Also want assistance in improving his drive/motivation in daily activity. Will engage in therapy that utilizes behavioral approaches to mitigate symptoms along with insight oriented counseling. Will also incorporate some conjoint sessions with spouse as needed, given her integral involvement with his care. Patient agrees with plan for treatment. Goal Date is 12-25    Patient agrees to be seen in video (caregility) session and is aware of the limitations of this platform. He is at home and provider in home office. Session Note:  Patient states that  he went for walk at park and it was challenging, but a good time. He has also been riding recumbent bike 10 minutes a day. He says that his moods have been good and relatively stable. Has not been feeling depressed and is cautiously optimistic about his future. Is encouraged that PT may help him  regain balance. We talked about how he wants to use sessions moving forward. He will give it some thought before the next session.                                                                                                                                          CONI ALM KERNS, PhD 4:15p-5:00p 45 minutes

## 2024-02-01 DIAGNOSIS — R2689 Other abnormalities of gait and mobility: Secondary | ICD-10-CM | POA: Diagnosis not present

## 2024-02-03 ENCOUNTER — Ambulatory Visit: Admitting: Psychology

## 2024-02-04 DIAGNOSIS — M0579 Rheumatoid arthritis with rheumatoid factor of multiple sites without organ or systems involvement: Secondary | ICD-10-CM | POA: Diagnosis not present

## 2024-02-04 DIAGNOSIS — M79643 Pain in unspecified hand: Secondary | ICD-10-CM | POA: Diagnosis not present

## 2024-02-04 DIAGNOSIS — Z79899 Other long term (current) drug therapy: Secondary | ICD-10-CM | POA: Diagnosis not present

## 2024-02-04 DIAGNOSIS — I429 Cardiomyopathy, unspecified: Secondary | ICD-10-CM | POA: Diagnosis not present

## 2024-02-08 DIAGNOSIS — R2689 Other abnormalities of gait and mobility: Secondary | ICD-10-CM | POA: Diagnosis not present

## 2024-02-10 ENCOUNTER — Ambulatory Visit: Admitting: Psychology

## 2024-02-10 DIAGNOSIS — F329 Major depressive disorder, single episode, unspecified: Secondary | ICD-10-CM

## 2024-02-10 DIAGNOSIS — F321 Major depressive disorder, single episode, moderate: Secondary | ICD-10-CM

## 2024-02-10 NOTE — Progress Notes (Signed)
 [facility] Counselor Initial Adult Exam  Name: Christopher Yu Date: 02/10/2024 MRN: [id number] DOB: [birth year] PCP: Dwight Trula SQUIBB, MD    Guardian/Payee:  N/A    Paperwork requested: No   Reason for Visit [name] Problem: Adjustment to serious medical illness  Mental Status Exam: Appearance:   Casual     Behavior:  Appropriate  Motor:  Shuffling Gait  Speech/Language:   Slurred  Affect:  Depressed  Mood:  depressed  Thought process:  normal  Thought content:    WNL  Sensory/Perceptual disturbances:    WNL  Orientation:  oriented to person, place, and situation  Attention:  Good  Concentration:  Good  Memory:  WNL  Fund of knowledge:   Good  Insight:    Good  Judgment:   unknown  Impulse Control:  unknown    Reported Symptoms:  Sadness, helplessness and hopelessness  Risk Assessment: Danger to Self:  No Self-injurious Behavior: No Danger to Others: No Duty to Warn:no Physical Aggression / Violence:No  Access to Firearms a concern: Unknown Gang Involvement:No  Patient / guardian was educated about steps to take if suicide or homicide risk level increases between visits: n/a While future psychiatric events cannot be accurately predicted, the patient does not currently require acute inpatient psychiatric care and does not  currently meet [place]  involuntary commitment criteria.  Substance Abuse History: Current substance abuse: No     Past Psychiatric History:   No previous psychological problems have been observed Outpatient Providers:N/A History of Psych Hospitalization: No  Psychological Testing: none   Abuse History:  Victim of:  N/A  Report needed: No. Victim  of Neglect:No. Perpetrator of N/A  Witness / Exposure to Domestic Violence: No   Protective Services Involvement: No  Witness to [employer] Violence:  No   Family History:  Family History  Problem Relation Age of Onset   Other Father        MVA   Lupus Sister     Living situation: the patient lives with their family  Sexual Orientation: Straight  Relationship Status: married  Name of spouse Christopher Yu If a parent, number of children / ages:Two girls, ages 69 and 25  Support Systems: spouse  Surveyor, quantity Stress:  Yes   Income/Employment/Disability: Software engineer: unknown  Educational History: Education: unknown  Oncologist: unknown  Any cultural differences that may affect / interfere with treatment:  not applicable   Recreation/Hobbies: golfing  Stressors: Financial difficulties   Health problems    Strengths: Family  Barriers:  severe physical limitations that compromises activity   Legal History: Pending legal issue / charges: The patient has no significant history of legal issues. History of legal issue / charges: N/A  Medical History/Surgical History: reviewed Past Medical History:  Diagnosis Date   Anxiety    Arthritis, rheumatoid (HCC)    dx 1988   CAD (coronary artery disease), native coronary artery    Mild LAD disease with calcification noted at cath 12//27/16    Cardiomyopathy (HCC)    Cardiomyopathy (HCC)    Chronic systolic heart failure (HCC) 05/01/2015   Depression    Deviated nasal septum 06/25/2011   Headache    High cholesterol     Hyperlipidemia    Impingement syndrome of left shoulder 12/22/2017   Macular degeneration    Nausea and vomiting 08/28/2022   Rheumatoid aortitis    Sleep apnea    does not use cpap - UNABLE TO TOLERATE MASK   Sleep apnea in adult    deviated septum repaired, most recent sleep study was negative   Status post total right knee replacement 06/01/2015    Past Surgical History:  Procedure Laterality Date   ANKLE FUSION Right 05/05/2012   related to his arthritis   ANKLE FUSION Left 05/25/2013   related to his arthritis   ANKLE SURGERY Bilateral    APPLICATION OF CRANIAL NAVIGATION N/A 04/11/2022   Procedure: APPLICATION OF CRANIAL NAVIGATION;  Surgeon: Lanis Pupa, MD;  Location: [facility] OR;  Service: Neurosurgery;  Laterality: N/A;   APPLICATION OF CRANIAL NAVIGATION N/A 08/08/2022   Procedure: APPLICATION OF CRANIAL NAVIGATION;  Surgeon: Lanis Pupa, MD;  Location: [facility] OR;  Service: Neurosurgery;  Laterality: N/A;   BIOPSY  06/09/2023   Procedure: BIOPSY;  Surgeon: Saintclair Jasper, MD;  Location: [facility];  Service: Gastroenterology;;   CARDIAC CATHETERIZATION N/A 05/01/2015   Procedure: Left Heart Cath and Coronary Angiography;  Surgeon: Victory LELON Sharps, MD;  Location: [facility] INVASIVE CV LAB;  Service: Cardiovascular;  Laterality: N/A;   COLONOSCOPY WITH PROPOFOL  N/A 06/09/2023   Procedure: COLONOSCOPY WITH PROPOFOL ;  Surgeon: Saintclair Jasper, MD;  Location: [facility];  Service: Gastroenterology;  Laterality: N/A;   CRANIOTOMY N/A 04/11/2022   Procedure: STEREOTACTIC SUBOCCIPITAL CRANIOTOMY FOR RESECTION OF ARTERIO-VENOUS MALFORMATION;  Surgeon: Lanis Pupa, MD;  Location: [facility];  Service: Neurosurgery;  Laterality: N/A;   ESOPHAGOGASTRODUODENOSCOPY (EGD) WITH PROPOFOL  N/A 06/09/2023   Procedure: ESOPHAGOGASTRODUODENOSCOPY (EGD) WITH PROPOFOL ;  Surgeon: Saintclair Jasper, MD;  Location: [facility];  Service: Gastroenterology;  Laterality: N/A;   FRACTURE SURGERY     left femur fracture x  3  HEMOSTASIS CLIP PLACEMENT  06/09/2023   Procedure: HEMOSTASIS CLIP PLACEMENT;  Surgeon: Saintclair Jasper, MD;  Location: [facility];  Service: Gastroenterology;;   IR ANGIO EXTERNAL CAROTID SEL EXT CAROTID BILAT MOD SED  01/28/2022   IR ANGIO INTRA EXTRACRAN SEL INTERNAL CAROTID BILAT MOD SED  01/28/2022   IR ANGIO INTRA EXTRACRAN SEL INTERNAL CAROTID BILAT MOD SED  04/16/2022   IR ANGIO VERTEBRAL SEL VERTEBRAL BILAT MOD SED  01/28/2022   IR ANGIO VERTEBRAL SEL VERTEBRAL BILAT MOD SED  04/16/2022   KNEE ARTHROSCOPY     right x 4   LAPAROSCOPIC REVISION VENTRICULAR-PERITONEAL (V-P) SHUNT N/A 08/08/2022   Procedure: LAPAROSCOPIC VENTRICULAR-PERITONEAL (V-P) SHUNT;  Surgeon: Vanderbilt Ned, MD;  Location: [facility];  Service: General;  Laterality: N/A;   LUMBAR LAMINECTOMY/DECOMPRESSION MICRODISCECTOMY N/A 07/18/2022   Procedure: Repair of Pseudomeningiocele posterior;  Surgeon: Lanis Pupa, MD;  Location: [facility];  Service: Neurosurgery;  Laterality: N/A;   NASAL SEPTOPLASTY W/ TURBINOPLASTY  06/25/2011   Procedure: NASAL SEPTOPLASTY WITH TURBINATE REDUCTION;  Surgeon: Alm Bouche, MD;  Location: [facility];  Service: ENT;  Laterality: Bilateral;   PLACEMENT OF LUMBAR DRAIN N/A 07/18/2022   Procedure: PLACEMENT OF LUMBAR DRAIN;  Surgeon: Lanis Pupa, MD;  Location: [facility];  Service: Neurosurgery;  Laterality: N/A;   POLYPECTOMY  06/09/2023   Procedure: POLYPECTOMY;  Surgeon: Saintclair Jasper, MD;  Location: [facility];  Service: Gastroenterology;;   TONSILLECTOMY     TOTAL KNEE ARTHROPLASTY Right 06/01/2015   Procedure: RIGHT TOTAL KNEE ARTHROPLASTY;  Surgeon: Lonni CINDERELLA Poli, MD;  Location: [facility];  Service: Orthopedics;  Laterality: Right;  Block+general   VENTRICULOPERITONEAL SHUNT Right 08/08/2022   Procedure: LAP ASSITED SHUNT INSERTION VENTRICULAR-PERITONEAL;  Surgeon: Lanis Pupa, MD;  Location: [facility];  Service: Neurosurgery;  Laterality: Right;    Medications: Current  Outpatient Medications  Medication Sig Dispense Refill   acetaminophen  (TYLENOL ) 325 MG tablet Take 2 tablets (650 mg total) by mouth 3 (three) times daily.     alendronate (FOSAMAX) 10 MG tablet Take 10 mg by mouth daily before breakfast. Take with a full glass of water  on an empty stomach.     artificial tears (LACRILUBE) OINT ophthalmic ointment Place into both eyes 2 (two) times daily.     aspirin  EC 81 MG tablet Take 1 tablet (81 mg total) by mouth daily. Swallow whole. 30 tablet 12   calcium  carbonate (TUMS - DOSED IN MG ELEMENTAL CALCIUM ) 500 MG chewable tablet Chew 1 tablet (200 mg of elemental calcium  total) by mouth daily with supper.     Cholecalciferol (VITAMIN D3) 50 MCG (2000 UT) TABS Take 2,000 Units by mouth daily.     famotidine (PEPCID) 40 MG tablet 1 tablet Orally Once a day for 30 days     folic acid  (FOLVITE ) 1 MG tablet Take 1 mg by mouth daily.     methotrexate  (RHEUMATREX) 2.5 MG tablet Take 20 mg by mouth once a week. Takes 8 tablets(20mg ) every Saturday.     Caution:Chemotherapy. Protect from light.     metoprolol  succinate (TOPROL -XL) 25 MG 24 hr tablet TAKE 1 TABLET DAILY 90 tablet 3   Multiple Vitamins-Minerals (PRESERVISION AREDS 2 PO) Take 2 capsules by mouth daily.     predniSONE  (STERAPRED UNI-PAK 21 TAB) 5 MG (21) TBPK tablet Take 5 mg by mouth every morning.     RITUXAN  500 MG/50ML injection See admin instructions. Every 6 months 2 dose infusion Ruxience      rosuvastatin  (CRESTOR ) 20 MG  tablet Take 1 tablet (20 mg total) by mouth daily. Patient must schedule annual appointment for further refills first attempt 30 tablet 0   sertraline  (ZOLOFT ) 20 MG/ML concentrated solution Take 25 mg by mouth daily.     spironolactone  (ALDACTONE ) 25 MG tablet 12.5 mg. 1/2 tab daily     sulfaSALAzine  (AZULFIDINE ) 500 MG tablet Take 1,000 mg by mouth 2 (two) times daily.     traMADol  (ULTRAM ) 50 MG tablet Take 1 tablet (50 mg total) by mouth every 12 (twelve) hours as needed for  moderate pain. (Patient taking differently: Take 50 mg by mouth as needed for moderate pain (pain score 4-6).) 60 tablet 2   traZODone  (DESYREL ) 50 MG tablet TAKE 1.5 TABLETS (75 MG TOTAL) BY MOUTH AT BEDTIME AS NEEDED. FOR SLEEP 135 tablet 5   White Petrolatum -Mineral Oil (GENTEAL TEARS NIGHT-TIME OP) Apply to eye at bedtime.     No current facility-administered medications for this visit.    Allergies  Allergen Reactions   Hydroxychloroquine      Other Reaction(s): cardiolmyopathy    Diagnoses:  Major depression  Plan of Care: Outpatient psychotherapy and medication management as needed. Initial Note: (With wife Christopher Yu). She says that she has hoped Khoen would get into therapy. He has Ataxic Tremor. He told Dr. Evonnie that he would like medication to just sit and do nothing. He says he always had golf to rely on and now cannot play. In December, he went n for a surgery to remove an avm from brain. Was told it was a routine surgery and was told that he needs to get it out. Was told it would be a short recovery and he would be back to work. 1 1/2 hour surgery turned into 6 hour surgery and he had complications. He had extra cerebral fluid and Dr. said it would absorb and just wait. He kept declining and by March, they went in to try and drain the extra fluid. He was in hospital for a week. Wife noticed that during the hospital stay, his speech declined and he was clearly weak. Nurses were trying to discharge him and she protested, but they sent him home anyway. He declined, and could not sit, stand, talk or walk and she ended up calling ambulance. In ER, she ws told he had a stroke. He was in ER 24 hours before getting a room in Neuro area. Head of stroke team came in and said it was not a stroke, but complications from surgery. She was blamed by medical team for not speaking up before being initially discharged. He ended up in a rehab for 2 weeks and told them he needed another surgery. Was told he  had hydrocephalus and need a shunt to drain liquid off his brain. She felt a lot of pressure from team that he needed surgery. She wanted a second opinion and had rouble getting it. Spoke to another Dr. and they decided to get the third surgery. He was in OT,PT and Speech Therapy. During therapy, he started having a tremor. They ended up being referred to Dr. Evonnie, who has now told him that treating his tremor is tricky and will take time.  Ari is no longer in OT,PT speech therapy. He is supposed to be doing exercises on his own, but has not been emotionally able to ge himself to do it. He has persistent headaches as well. Just had a CAT Scan and awaiting the results. His headaches are worse and they are concerned. He  also has rhumatory arthritis and it is flaring up because he had to stop meds. He has cardiomyopathy, macular degeneration on top of all of these other conditions.  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 He has been approved for disability and are working to get social security disability. She works part time. They have 2 daughters, one with diabetes and arthritis. She is heading to [employer] this Fall and wants to work in Research scientist (physical sciences). Other daughter is 41 and is our introvert with lots of anxiety. She is in therapy and has mental health issues that is being treated. She will be a Holiday representative at the middle college at [employer].  They are told that medicine will not control his condition, but they will have to try to retrain his brain. Suggested that they work to get back into OT and PT. She has a hard time motivating him. He admits that he keeps things inside. She says he is not a good comunicator. He will not share things like if he is in pain or hurts himself.   Tx. Plan/Goals: Patient reports depressive symptoms secondary to the medical trauma he has suffered and the debilitating physical results. He is seeking therapy to  resolve his feelings of helplessness and hopelessness. Also want assistance in improving his drive/motivation in daily activity. Will engage in therapy that utilizes behavioral approaches to mitigate symptoms along with insight oriented counseling. Will also incorporate some conjoint sessions with spouse as needed, given her integral involvement with his care. Patient agrees with plan for treatment. Goal Date is 12-25    Patient agrees to be seen in provider's office  Session Note:  Patient states he has not been doing much lately. He has been cooking a lot as a past time, and is having to make many different dishes to accommodate the different dietary needs. We talked about when he will be ready to demonstrate his driving ability. Would like to do it sooner than later, but wife is always.SABRA                                                                                                                                   CONI ALM KERNS, PhD 4:10p-5:00p 50 minutes

## 2024-02-16 DIAGNOSIS — R2689 Other abnormalities of gait and mobility: Secondary | ICD-10-CM | POA: Diagnosis not present

## 2024-02-17 ENCOUNTER — Ambulatory Visit (INDEPENDENT_AMBULATORY_CARE_PROVIDER_SITE_OTHER): Admitting: Psychology

## 2024-02-17 DIAGNOSIS — F329 Major depressive disorder, single episode, unspecified: Secondary | ICD-10-CM | POA: Diagnosis not present

## 2024-02-17 DIAGNOSIS — F321 Major depressive disorder, single episode, moderate: Secondary | ICD-10-CM

## 2024-02-17 NOTE — Progress Notes (Signed)
 [facility] Counselor Initial Adult Exam  Name: Christopher Yu Date: 02/17/2024 MRN: [id number] DOB: 1960/12/22 PCP: Dwight Trula SQUIBB, MD    Guardian/Payee:  N/A    Paperwork requested: No   Reason for Visit [name] Problem: Adjustment to serious medical illness  Mental Status Exam: Appearance:   Casual     Behavior:  Appropriate  Motor:  Shuffling Gait  Speech/Language:   Slurred  Affect:  Depressed  Mood:  depressed  Thought process:  normal  Thought content:    WNL  Sensory/Perceptual disturbances:    WNL  Orientation:  oriented to person, place, and situation  Attention:  Good  Concentration:  Good  Memory:  WNL  Fund of knowledge:   Good  Insight:    Good  Judgment:   unknown  Impulse Control:  unknown    Reported Symptoms:  Sadness, helplessness and hopelessness  Risk Assessment: Danger to Self:  No Self-injurious Behavior: No Danger to Others: No Duty to Warn:no Physical Aggression / Violence:No  Access to Firearms a concern: Unknown Gang Involvement:No  Patient / guardian was educated about steps to take if suicide or homicide risk level increases between visits: n/a While future psychiatric events cannot be accurately predicted, the patient does not currently require acute inpatient  psychiatric care and does not currently meet [place]  involuntary commitment criteria.  Substance Abuse History: Current substance abuse: No     Past Psychiatric History:   No previous psychological problems have been observed Outpatient Providers:N/A History of Psych Hospitalization: No  Psychological Testing:  none   Abuse History:  Victim of:  N/A  Report needed: No. Victim of Neglect:No. Perpetrator of N/A  Witness / Exposure to Domestic Violence: No   Protective Services Involvement: No  Witness to [employer] Violence:  No   Family History:  Family History  Problem Relation Age of Onset   Other Father        MVA   Lupus Sister     Living situation: the patient lives with their family  Sexual Orientation: Straight  Relationship Status: married  Name of spouse [name] If a parent, number of children / ages:Two girls, ages 74 and 25  Support Systems: spouse  Surveyor, quantity Stress:  Yes   Income/Employment/Disability: Software engineer: unknown  Educational History: Education: unknown  Oncologist: unknown  Any cultural differences that may affect / interfere with treatment:  not applicable   Recreation/Hobbies: golfing  Stressors: Financial difficulties   Health problems    Strengths: Family  Barriers:  severe physical limitations that compromises activity   Legal History: Pending legal issue / charges: The patient has no significant history of legal issues. History of legal issue / charges: N/A  Medical History/Surgical History: reviewed Past Medical History:  Diagnosis Date   Anxiety    Arthritis, rheumatoid (HCC)    dx 1988   CAD (coronary artery disease), native coronary artery    Mild LAD disease with calcification noted at cath 12//27/16    Cardiomyopathy (HCC)    Cardiomyopathy (HCC)    Chronic systolic heart failure (HCC) 05/01/2015   Depression    Deviated nasal septum 06/25/2011   Headache     High cholesterol    Hyperlipidemia    Impingement syndrome of left shoulder 12/22/2017   Macular degeneration    Nausea and vomiting 08/28/2022   Rheumatoid aortitis    Sleep apnea    does not use cpap - UNABLE TO TOLERATE MASK   Sleep apnea in adult    deviated septum repaired, most recent sleep study was negative   Status post total right knee replacement 06/01/2015    Past Surgical History:  Procedure Laterality Date   ANKLE FUSION Right 05/05/2012   related to his arthritis   ANKLE FUSION Left 05/25/2013   related to his arthritis   ANKLE SURGERY Bilateral    APPLICATION OF CRANIAL NAVIGATION N/A 04/11/2022   Procedure: APPLICATION OF CRANIAL NAVIGATION;  Surgeon: Lanis Pupa, MD;  Location: [facility] OR;  Service: Neurosurgery;  Laterality: N/A;   APPLICATION OF CRANIAL NAVIGATION N/A 08/08/2022   Procedure: APPLICATION OF CRANIAL NAVIGATION;  Surgeon: Lanis Pupa, MD;  Location: [facility] OR;  Service: Neurosurgery;  Laterality: N/A;   BIOPSY  06/09/2023   Procedure: BIOPSY;  Surgeon: Saintclair Jasper, MD;  Location: [facility];  Service: Gastroenterology;;   CARDIAC CATHETERIZATION N/A 05/01/2015   Procedure: Left Heart Cath and Coronary Angiography;  Surgeon: Victory LELON Sharps, MD;  Location: [facility] INVASIVE CV LAB;  Service: Cardiovascular;  Laterality: N/A;   COLONOSCOPY WITH PROPOFOL  N/A 06/09/2023   Procedure: COLONOSCOPY WITH PROPOFOL ;  Surgeon: Saintclair Jasper, MD;  Location: [facility];  Service: Gastroenterology;  Laterality: N/A;   CRANIOTOMY N/A 04/11/2022   Procedure: STEREOTACTIC SUBOCCIPITAL CRANIOTOMY FOR RESECTION OF ARTERIO-VENOUS MALFORMATION;  Surgeon: Lanis Pupa, MD;  Location: [facility];  Service: Neurosurgery;  Laterality: N/A;   ESOPHAGOGASTRODUODENOSCOPY (EGD) WITH PROPOFOL  N/A 06/09/2023   Procedure: ESOPHAGOGASTRODUODENOSCOPY (EGD) WITH PROPOFOL ;  Surgeon: Saintclair Jasper, MD;  Location: [facility];  Service: Gastroenterology;  Laterality: N/A;  FRACTURE SURGERY      left femur fracture x 3   HEMOSTASIS CLIP PLACEMENT  06/09/2023   Procedure: HEMOSTASIS CLIP PLACEMENT;  Surgeon: Saintclair Jasper, MD;  Location: [facility];  Service: Gastroenterology;;   IR ANGIO EXTERNAL CAROTID SEL EXT CAROTID BILAT MOD SED  01/28/2022   IR ANGIO INTRA EXTRACRAN SEL INTERNAL CAROTID BILAT MOD SED  01/28/2022   IR ANGIO INTRA EXTRACRAN SEL INTERNAL CAROTID BILAT MOD SED  04/16/2022   IR ANGIO VERTEBRAL SEL VERTEBRAL BILAT MOD SED  01/28/2022   IR ANGIO VERTEBRAL SEL VERTEBRAL BILAT MOD SED  04/16/2022   KNEE ARTHROSCOPY     right x 4   LAPAROSCOPIC REVISION VENTRICULAR-PERITONEAL (V-P) SHUNT N/A 08/08/2022   Procedure: LAPAROSCOPIC VENTRICULAR-PERITONEAL (V-P) SHUNT;  Surgeon: Vanderbilt Ned, MD;  Location: [facility];  Service: General;  Laterality: N/A;   LUMBAR LAMINECTOMY/DECOMPRESSION MICRODISCECTOMY N/A 07/18/2022   Procedure: Repair of Pseudomeningiocele posterior;  Surgeon: Lanis Pupa, MD;  Location: [facility];  Service: Neurosurgery;  Laterality: N/A;   NASAL SEPTOPLASTY W/ TURBINOPLASTY  06/25/2011   Procedure: NASAL SEPTOPLASTY WITH TURBINATE REDUCTION;  Surgeon: Alm Bouche, MD;  Location: [facility] OR;  Service: ENT;  Laterality: Bilateral;   PLACEMENT OF LUMBAR DRAIN N/A 07/18/2022   Procedure: PLACEMENT OF LUMBAR DRAIN;  Surgeon: Lanis Pupa, MD;  Location: [facility];  Service: Neurosurgery;  Laterality: N/A;   POLYPECTOMY  06/09/2023   Procedure: POLYPECTOMY;  Surgeon: Saintclair Jasper, MD;  Location: [facility];  Service: Gastroenterology;;   TONSILLECTOMY     TOTAL KNEE ARTHROPLASTY Right 06/01/2015   Procedure: RIGHT TOTAL KNEE ARTHROPLASTY;  Surgeon: Lonni CINDERELLA Poli, MD;  Location: [facility];  Service: Orthopedics;  Laterality: Right;  Block+general   VENTRICULOPERITONEAL SHUNT Right 08/08/2022   Procedure: LAP ASSITED SHUNT INSERTION VENTRICULAR-PERITONEAL;  Surgeon: Lanis Pupa, MD;  Location: [facility];  Service: Neurosurgery;  Laterality: Right;     Medications: Current Outpatient Medications  Medication Sig Dispense Refill   acetaminophen  (TYLENOL ) 325 MG tablet Take 2 tablets (650 mg total) by mouth 3 (three) times daily.     alendronate (FOSAMAX) 10 MG tablet Take 10 mg by mouth daily before breakfast. Take with a full glass of water  on an empty stomach.     artificial tears (LACRILUBE) OINT ophthalmic ointment Place into both eyes 2 (two) times daily.     aspirin  EC 81 MG tablet Take 1 tablet (81 mg total) by mouth daily. Swallow whole. 30 tablet 12   calcium  carbonate (TUMS - DOSED IN MG ELEMENTAL CALCIUM ) 500 MG chewable tablet Chew 1 tablet (200 mg of elemental calcium  total) by mouth daily with supper.     Cholecalciferol (VITAMIN D3) 50 MCG (2000 UT) TABS Take 2,000 Units by mouth daily.     famotidine (PEPCID) 40 MG tablet 1 tablet Orally Once a day for 30 days     folic acid  (FOLVITE ) 1 MG tablet Take 1 mg by mouth daily.     methotrexate  (RHEUMATREX) 2.5 MG tablet Take 20 mg by mouth once a week. Takes 8 tablets(20mg ) every Saturday.     Caution:Chemotherapy. Protect from light.     metoprolol  succinate (TOPROL -XL) 25 MG 24 hr tablet TAKE 1 TABLET DAILY 90 tablet 3   Multiple Vitamins-Minerals (PRESERVISION AREDS 2 PO) Take 2 capsules by mouth daily.     predniSONE  (STERAPRED UNI-PAK 21 TAB) 5 MG (21) TBPK tablet Take 5 mg by mouth every morning.     RITUXAN  500 MG/50ML injection See admin instructions. Every  6 months 2 dose infusion Ruxience      rosuvastatin  (CRESTOR ) 20 MG tablet Take 1 tablet (20 mg total) by mouth daily. Patient must schedule annual appointment for further refills first attempt 30 tablet 0   sertraline  (ZOLOFT ) 20 MG/ML concentrated solution Take 25 mg by mouth daily.     spironolactone  (ALDACTONE ) 25 MG tablet 12.5 mg. 1/2 tab daily     sulfaSALAzine  (AZULFIDINE ) 500 MG tablet Take 1,000 mg by mouth 2 (two) times daily.     traMADol  (ULTRAM ) 50 MG tablet Take 1 tablet (50 mg total) by mouth every 12  (twelve) hours as needed for moderate pain. (Patient taking differently: Take 50 mg by mouth as needed for moderate pain (pain score 4-6).) 60 tablet 2   traZODone  (DESYREL ) 50 MG tablet TAKE 1.5 TABLETS (75 MG TOTAL) BY MOUTH AT BEDTIME AS NEEDED. FOR SLEEP 135 tablet 5   White Petrolatum -Mineral Oil (GENTEAL TEARS NIGHT-TIME OP) Apply to eye at bedtime.     No current facility-administered medications for this visit.    Allergies  Allergen Reactions   Hydroxychloroquine      Other Reaction(s): cardiolmyopathy    Diagnoses:  Major depression  Plan of Care: Outpatient psychotherapy and medication management as needed. Initial Note: (With wife [name]). She says that she has hoped Christopher Yu would get into therapy. Christopher Yu has Ataxic Tremor. Christopher Yu told Dr. Evonnie that Christopher Yu would like medication to just sit and do nothing. Christopher Yu says Christopher Yu always had golf to rely on and now cannot play. In December, Christopher Yu went n for a surgery to remove an avm from brain. Was told it was a routine surgery and was told that Christopher Yu needs to get it out. Was told it would be a short recovery and Christopher Yu would be back to work. 1 1/2 hour surgery turned into 6 hour surgery and Christopher Yu had complications. Christopher Yu had extra cerebral fluid and Dr. said it would absorb and just wait. Christopher Yu kept declining and by March, they went in to try and drain the extra fluid. Christopher Yu was in hospital for a week. Wife noticed that during the hospital stay, his speech declined and Christopher Yu was clearly weak. Nurses were trying to discharge him and she protested, but they sent him home anyway. Christopher Yu declined, and could not sit, stand, talk or walk and she ended up calling ambulance. In ER, she ws told Christopher Yu had a stroke. Christopher Yu was in ER 24 hours before getting a room in Neuro area. Head of stroke team came in and said it was not a stroke, but complications from surgery. She was blamed by medical team for not speaking up before being initially discharged. Christopher Yu ended up in a rehab for 2 weeks and told them Christopher Yu needed  another surgery. Was told Christopher Yu had hydrocephalus and need a shunt to drain liquid off his brain. She felt a lot of pressure from team that Christopher Yu needed surgery. She wanted a second opinion and had rouble getting it. Spoke to another Dr. and they decided to get the third surgery. Christopher Yu was in OT,PT and Speech Therapy. During therapy, Christopher Yu started having a tremor. They ended up being referred to Dr. Evonnie, who has now told him that treating his tremor is tricky and will take time.  Christopher Yu is no longer in OT,PT speech therapy. Christopher Yu is supposed to be doing exercises on his own, but has not been emotionally able to ge himself to do it. Christopher Yu has persistent headaches as well. Just had a CAT  Scan and awaiting the results. His headaches are worse and they are concerned. Christopher Yu also has rhumatory arthritis and it is flaring up because Christopher Yu had to stop meds. Christopher Yu has cardiomyopathy, macular degeneration on top of all of these other conditions.  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 Christopher Yu is still employed with insurance, but they fear Christopher Yu may be let go. Christopher Yu has been approved for disability and are working to get social security disability. She works part time. They have 2 daughters, one with diabetes and arthritis. She is heading to [employer] this Fall and wants to work in Research scientist (physical sciences). Other daughter is 53 and is our introvert with lots of anxiety. She is in therapy and has mental health issues that is being treated. She will be a Holiday representative at the middle college at [employer].  They are told that medicine will not control his condition, but they will have to try to retrain his brain. Suggested that they work to get back into OT and PT. She has a hard time motivating him. Christopher Yu admits that Christopher Yu keeps things inside. She says Christopher Yu is not a good comunicator. Christopher Yu will not share things like if Christopher Yu is in pain or hurts himself.   Tx. Plan/Goals: Patient reports depressive symptoms secondary to the medical trauma Christopher Yu has suffered and the debilitating physical results.  Christopher Yu is seeking therapy to resolve his feelings of helplessness and hopelessness. Also want assistance in improving his drive/motivation in daily activity. Will engage in therapy that utilizes behavioral approaches to mitigate symptoms along with insight oriented counseling. Will also incorporate some conjoint sessions with spouse as needed, given her integral involvement with his care. Patient agrees with plan for treatment. Goal Date is 12-25    Patient agrees to a Forensic psychologist Public affairs consultant) session. Christopher Yu understands limitations of platform. Christopher Yu is at home and provider in his office.   Session Note:  Patient states  Christopher Yu is doing well. Christopher Yu talked about the ways that Christopher Yu is keeping himself busy. Physical therapy and exercise routines help keep him busy. Talked about his hope to get back on the golf course once Christopher Yu makes more improvement in PT. Christopher Yu talked about being busy at home with tasks that help keeping him from getting bored. His moods have been reasonably good and does not report any significant depressive episodes.                                                                                                                                        CONI ALM KERNS, PhD 4:10p-5:00p 50 minutes

## 2024-02-18 ENCOUNTER — Ambulatory Visit: Admitting: Student in an Organized Health Care Education/Training Program

## 2024-02-24 ENCOUNTER — Ambulatory Visit: Admitting: Psychology

## 2024-02-24 ENCOUNTER — Telehealth: Payer: Self-pay | Admitting: Neurology

## 2024-02-24 DIAGNOSIS — F321 Major depressive disorder, single episode, moderate: Secondary | ICD-10-CM

## 2024-02-24 DIAGNOSIS — F329 Major depressive disorder, single episode, unspecified: Secondary | ICD-10-CM

## 2024-02-24 NOTE — Telephone Encounter (Signed)
 Call patient and set up NP appt with me please.  Tell him would like to chat more about his tremor a bit.  Not sure that we can do much more but have some ideas and would like to talk

## 2024-02-24 NOTE — Progress Notes (Signed)
 [facility] Counselor Initial Adult Exam  Name: Quay Simkin Date: 02/24/2024 MRN: [id number] DOB: 07/27/1960 PCP: Dwight Trula SQUIBB, MD    Guardian/Payee:  N/A    Paperwork requested: No   Reason for Visit [name] Problem: Adjustment to serious medical illness  Mental Status Exam: Appearance:   Casual     Behavior:  Appropriate  Motor:  Shuffling Gait  Speech/Language:   Slurred  Affect:  Depressed  Mood:  depressed  Thought process:  normal  Thought content:    WNL  Sensory/Perceptual disturbances:    WNL  Orientation:  oriented to person, place, and situation  Attention:  Good  Concentration:  Good  Memory:  WNL  Fund of knowledge:   Good  Insight:    Good  Judgment:   unknown  Impulse Control:  unknown    Reported Symptoms:  Sadness, helplessness and hopelessness  Risk Assessment: Danger to Self:  No Self-injurious Behavior: No Danger to Others: No Duty to Warn:no Physical Aggression / Violence:No  Access to Firearms a concern: Unknown Gang Involvement:No  Patient / guardian was educated about steps to take if suicide or homicide risk level increases between visits: n/a While future psychiatric events cannot be accurately predicted, the patient does not  currently require acute inpatient psychiatric care and does not currently meet [place]  involuntary commitment criteria.  Substance Abuse History: Current substance abuse: No     Past Psychiatric History:   No previous  psychological problems have been observed Outpatient Providers:N/A History of Psych Hospitalization: No  Psychological Testing: none   Abuse History:  Victim of:  N/A  Report needed: No. Victim of Neglect:No. Perpetrator of N/A  Witness / Exposure to Domestic Violence: No   Protective Services Involvement: No  Witness to [employer] Violence:  No   Family History:  Family History  Problem Relation Age of Onset   Other Father        MVA   Lupus Sister     Living situation: the patient lives with their family  Sexual Orientation: Straight  Relationship Status: married  Name of spouse [name] If a parent, number of children / ages:Two girls, ages 19 and 78  Support Systems: spouse  Surveyor, quantity Stress:  Yes   Income/Employment/Disability: Software engineer: unknown  Educational History: Education: unknown  Oncologist: unknown  Any cultural differences that may affect / interfere with treatment:  not applicable   Recreation/Hobbies: golfing  Stressors: Financial difficulties   Health problems    Strengths: Family  Barriers:  severe physical limitations that compromises activity   Legal History: Pending legal issue / charges: The patient has no significant history of legal issues. History of legal issue / charges: N/A  Medical History/Surgical History: reviewed Past Medical History:  Diagnosis Date   Anxiety    Arthritis, rheumatoid (HCC)    dx 1988   CAD (coronary artery disease), native coronary artery    Mild LAD disease with calcification noted at cath 12//27/16    Cardiomyopathy (HCC)    Cardiomyopathy (HCC)    Chronic systolic heart failure (HCC) 05/01/2015   Depression    Deviated  nasal septum 06/25/2011   Headache    High cholesterol    Hyperlipidemia    Impingement syndrome of left shoulder 12/22/2017   Macular degeneration    Nausea and vomiting 08/28/2022   Rheumatoid aortitis    Sleep apnea    does not use cpap - UNABLE TO TOLERATE MASK   Sleep apnea in adult    deviated septum repaired, most recent sleep study was negative   Status post total right knee replacement 06/01/2015    Past Surgical History:  Procedure Laterality Date   ANKLE FUSION Right 05/05/2012   related to his arthritis   ANKLE FUSION Left 05/25/2013   related to his arthritis   ANKLE SURGERY Bilateral    APPLICATION OF CRANIAL NAVIGATION N/A 04/11/2022   Procedure: APPLICATION OF CRANIAL NAVIGATION;  Surgeon: Lanis Pupa, MD;  Location: [facility] OR;  Service: Neurosurgery;  Laterality: N/A;   APPLICATION OF CRANIAL NAVIGATION N/A 08/08/2022   Procedure: APPLICATION OF CRANIAL NAVIGATION;  Surgeon: Lanis Pupa, MD;  Location: [facility] OR;  Service: Neurosurgery;  Laterality: N/A;   BIOPSY  06/09/2023   Procedure: BIOPSY;  Surgeon: Saintclair Jasper, MD;  Location: [facility];  Service: Gastroenterology;;   CARDIAC CATHETERIZATION N/A 05/01/2015   Procedure: Left Heart Cath and Coronary Angiography;  Surgeon: Victory LELON Sharps, MD;  Location: [facility] INVASIVE CV LAB;  Service: Cardiovascular;  Laterality: N/A;   COLONOSCOPY WITH PROPOFOL  N/A 06/09/2023   Procedure: COLONOSCOPY WITH PROPOFOL ;  Surgeon: Saintclair Jasper, MD;  Location: [facility];  Service: Gastroenterology;  Laterality: N/A;   CRANIOTOMY N/A 04/11/2022   Procedure: STEREOTACTIC SUBOCCIPITAL CRANIOTOMY FOR RESECTION OF ARTERIO-VENOUS MALFORMATION;  Surgeon: Lanis Pupa, MD;  Location: [facility];  Service: Neurosurgery;  Laterality: N/A;   ESOPHAGOGASTRODUODENOSCOPY (EGD) WITH PROPOFOL  N/A 06/09/2023   Procedure: ESOPHAGOGASTRODUODENOSCOPY (EGD) WITH PROPOFOL ;  Surgeon: Saintclair Jasper, MD;  Location: [facility];  Service: Gastroenterology;   Laterality: N/A;   FRACTURE SURGERY     left femur fracture x 3   HEMOSTASIS CLIP PLACEMENT  06/09/2023   Procedure: HEMOSTASIS CLIP PLACEMENT;  Surgeon: Saintclair Jasper, MD;  Location: [facility];  Service: Gastroenterology;;   IR ANGIO EXTERNAL CAROTID SEL EXT CAROTID BILAT MOD SED  01/28/2022   IR ANGIO INTRA EXTRACRAN SEL INTERNAL CAROTID BILAT MOD SED  01/28/2022   IR ANGIO INTRA EXTRACRAN SEL INTERNAL CAROTID BILAT MOD SED  04/16/2022   IR ANGIO VERTEBRAL SEL VERTEBRAL BILAT MOD SED  01/28/2022   IR ANGIO VERTEBRAL SEL VERTEBRAL BILAT MOD SED  04/16/2022   KNEE ARTHROSCOPY     right x 4   LAPAROSCOPIC REVISION VENTRICULAR-PERITONEAL (V-P) SHUNT N/A 08/08/2022   Procedure: LAPAROSCOPIC VENTRICULAR-PERITONEAL (V-P) SHUNT;  Surgeon: Vanderbilt Ned, MD;  Location: [facility];  Service: General;  Laterality: N/A;   LUMBAR LAMINECTOMY/DECOMPRESSION MICRODISCECTOMY N/A 07/18/2022   Procedure: Repair of Pseudomeningiocele posterior;  Surgeon: Lanis Pupa, MD;  Location: [facility];  Service: Neurosurgery;  Laterality: N/A;   NASAL SEPTOPLASTY W/ TURBINOPLASTY  06/25/2011   Procedure: NASAL SEPTOPLASTY WITH TURBINATE REDUCTION;  Surgeon: Alm Bouche, MD;  Location: [facility] OR;  Service: ENT;  Laterality: Bilateral;   PLACEMENT OF LUMBAR DRAIN N/A 07/18/2022   Procedure: PLACEMENT OF LUMBAR DRAIN;  Surgeon: Lanis Pupa, MD;  Location: [facility];  Service: Neurosurgery;  Laterality: N/A;   POLYPECTOMY  06/09/2023   Procedure: POLYPECTOMY;  Surgeon: Saintclair Jasper, MD;  Location: [facility];  Service: Gastroenterology;;   TONSILLECTOMY     TOTAL KNEE ARTHROPLASTY Right 06/01/2015   Procedure: RIGHT TOTAL KNEE ARTHROPLASTY;  Surgeon: Lonni CINDERELLA Poli, MD;  Location: [facility];  Service: Orthopedics;  Laterality: Right;  Block+general   VENTRICULOPERITONEAL SHUNT Right 08/08/2022   Procedure: LAP ASSITED SHUNT INSERTION VENTRICULAR-PERITONEAL;  Surgeon: Lanis Pupa, MD;  Location: [facility];  Service:  Neurosurgery;  Laterality: Right;    Medications: Current Outpatient Medications  Medication Sig Dispense Refill   acetaminophen  (TYLENOL ) 325 MG tablet Take 2 tablets (650 mg total) by mouth 3 (three) times daily.     alendronate (FOSAMAX) 10 MG tablet Take 10 mg by mouth daily before breakfast. Take with a full glass of water  on an empty stomach.     artificial tears (LACRILUBE) OINT ophthalmic ointment Place into both eyes 2 (two) times daily.     aspirin  EC 81 MG tablet Take 1 tablet (81 mg total) by mouth daily. Swallow whole. 30 tablet 12   calcium  carbonate (TUMS - DOSED IN MG ELEMENTAL CALCIUM ) 500 MG chewable tablet Chew 1 tablet (200 mg of elemental calcium  total) by mouth daily with supper.     Cholecalciferol (VITAMIN D3) 50 MCG (2000 UT) TABS Take 2,000 Units by mouth daily.     famotidine (PEPCID) 40 MG tablet 1 tablet Orally Once a day for 30 days     folic acid  (FOLVITE ) 1 MG tablet Take 1 mg by mouth daily.     methotrexate  (RHEUMATREX) 2.5 MG tablet Take 20 mg by mouth once a week. Takes 8 tablets(20mg ) every Saturday.     Caution:Chemotherapy. Protect from light.     metoprolol  succinate (TOPROL -XL) 25 MG 24 hr tablet TAKE 1 TABLET DAILY 90 tablet 3   Multiple Vitamins-Minerals (PRESERVISION AREDS 2 PO) Take 2 capsules by mouth daily.     predniSONE  (STERAPRED UNI-PAK 21 TAB) 5 MG (21) TBPK tablet Take 5 mg  by mouth every morning.     RITUXAN  500 MG/50ML injection See admin instructions. Every 6 months 2 dose infusion Ruxience      rosuvastatin  (CRESTOR ) 20 MG tablet Take 1 tablet (20 mg total) by mouth daily. Patient must schedule annual appointment for further refills first attempt 30 tablet 0   sertraline  (ZOLOFT ) 20 MG/ML concentrated solution Take 25 mg by mouth daily.     spironolactone  (ALDACTONE ) 25 MG tablet 12.5 mg. 1/2 tab daily     sulfaSALAzine  (AZULFIDINE ) 500 MG tablet Take 1,000 mg by mouth 2 (two) times daily.     traMADol  (ULTRAM ) 50 MG tablet Take 1 tablet  (50 mg total) by mouth every 12 (twelve) hours as needed for moderate pain. (Patient taking differently: Take 50 mg by mouth as needed for moderate pain (pain score 4-6).) 60 tablet 2   traZODone  (DESYREL ) 50 MG tablet TAKE 1.5 TABLETS (75 MG TOTAL) BY MOUTH AT BEDTIME AS NEEDED. FOR SLEEP 135 tablet 5   White Petrolatum -Mineral Oil (GENTEAL TEARS NIGHT-TIME OP) Apply to eye at bedtime.     No current facility-administered medications for this visit.    Allergies  Allergen Reactions   Hydroxychloroquine      Other Reaction(s): cardiolmyopathy    Diagnoses:  Major depression  Plan of Care: Outpatient psychotherapy and medication management as needed. Initial Note: (With wife [name]). She says that she has hoped Ivin would get into therapy. He has Ataxic Tremor. He told Dr. Evonnie that he would like medication to just sit and do nothing. He says he always had golf to rely on and now cannot play. In December, he went n for a surgery to remove an avm from brain. Was told it was a routine surgery and was told that he needs to get it out. Was told it would be a short recovery and he would be back to work. 1 1/2 hour surgery turned into 6 hour surgery and he had complications. He had extra cerebral fluid and Dr. said it would absorb and just wait. He kept declining and by March, they went in to try and drain the extra fluid. He was in hospital for a week. Wife noticed that during the hospital stay, his speech declined and he was clearly weak. Nurses were trying to discharge him and she protested, but they sent him home anyway. He declined, and could not sit, stand, talk or walk and she ended up calling ambulance. In ER, she ws told he had a stroke. He was in ER 24 hours before getting a room in Neuro area. Head of stroke team came in and said it was not a stroke, but complications from surgery. She was blamed by medical team for not speaking up before being initially discharged. He ended up in a rehab for 2  weeks and told them he needed another surgery. Was told he had hydrocephalus and need a shunt to drain liquid off his brain. She felt a lot of pressure from team that he needed surgery. She wanted a second opinion and had rouble getting it. Spoke to another Dr. and they decided to get the third surgery. He was in OT,PT and Speech Therapy. During therapy, he started having a tremor. They ended up being referred to Dr. Evonnie, who has now told him that treating his tremor is tricky and will take time.  Shawon is no longer in OT,PT speech therapy. He is supposed to be doing exercises on his own, but has not been emotionally able  to ge himself to do it. He has persistent headaches as well. Just had a CAT Scan and awaiting the results. His headaches are worse and they are concerned. He also has rhumatory arthritis and it is flaring up because he had to stop meds. He has cardiomyopathy, macular degeneration on top of all of these other conditions.  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 He has been approved for disability and are working to get social security disability. She works part time. They have 2 daughters, one with diabetes and arthritis. She is heading to [employer] this Fall and wants to work in Research scientist (physical sciences). Other daughter is 44 and is our introvert with lots of anxiety. She is in therapy and has mental health issues that is being treated. She will be a Holiday representative at the middle college at [employer].  They are told that medicine will not control his condition, but they will have to try to retrain his brain. Suggested that they work to get back into OT and PT. She has a hard time motivating him. He admits that he keeps things inside. She says he is not a good comunicator. He will not share things like if he is in pain or hurts himself.   Tx. Plan/Goals: Patient reports depressive symptoms secondary to the medical trauma he has suffered and the  debilitating physical results. He is seeking therapy to resolve his feelings of helplessness and hopelessness. Also want assistance in improving his drive/motivation in daily activity. Will engage in therapy that utilizes behavioral approaches to mitigate symptoms along with insight oriented counseling. Will also incorporate some conjoint sessions with spouse as needed, given her integral involvement with his care. Patient agrees with plan for treatment. Goal Date is 12-25    Patient agrees to a Forensic psychologist Public affairs consultant) session. He understands limitations of platform. He is at home and provider in his office.   Session Note:  I shared with [place] that I talked with Dr. Evonnie and she is going to reach out to share information about a treatment she thinks might be helpful for his symptoms. He is open to anything. He hs been attending physical therapy and both his therapists are pleased with his progress. He talked about wanting another dog for companionship.                                                                                                                                           CONI ALM KERNS, PhD 4:10p-5:00p 50 minutes

## 2024-03-02 ENCOUNTER — Ambulatory Visit: Admitting: Psychology

## 2024-03-02 DIAGNOSIS — F321 Major depressive disorder, single episode, moderate: Secondary | ICD-10-CM | POA: Diagnosis not present

## 2024-03-02 NOTE — Progress Notes (Signed)
 [facility] Counselor Initial Adult Exam  Name: Christopher Yu Date: 03/02/2024 MRN: [id number] DOB: 1961/03/25 PCP: Dwight Trula SQUIBB, MD    Guardian/Payee:  N/A    Paperwork requested: No   Reason for Visit [name] Problem: Adjustment to serious medical illness  Mental Status Exam: Appearance:   Casual     Behavior:  Appropriate  Motor:  Shuffling Gait  Speech/Language:   Slurred  Affect:  Depressed  Mood:  depressed  Thought process:  normal  Thought content:    WNL  Sensory/Perceptual disturbances:    WNL  Orientation:  oriented to person, place, and situation  Attention:  Good  Concentration:  Good  Memory:  WNL  Fund of knowledge:   Good  Insight:    Good  Judgment:   unknown  Impulse Control:  unknown    Reported Symptoms:  Sadness, helplessness and hopelessness  Risk Assessment: Danger to Self:  No Self-injurious Behavior: No Danger to Others: No Duty to Warn:no Physical Aggression / Violence:No  Access to Firearms a concern: Unknown Gang Involvement:No  Patient / guardian was educated about steps to take if suicide or homicide risk level increases between visits: n/a While future psychiatric events cannot be accurately predicted, the patient does not currently require acute inpatient psychiatric care and does not currently meet [place]  involuntary commitment criteria.  Substance Abuse History: Current substance abuse: No     Past Psychiatric History:   No previous psychological problems have been observed Outpatient Providers:N/A History of Psych Hospitalization: No  Psychological Testing: none   Abuse History:  Victim of:  N/A  Report needed: No. Victim of Neglect:No. Perpetrator of N/A  Witness / Exposure to Domestic Violence: No   Protective Services Involvement: No  Witness to [employer] Violence:  No   Family History:  Family History  Problem Relation Age of Onset   Other Father        MVA    Lupus Sister     Living situation: the patient lives with their family  Sexual Orientation: Straight  Relationship Status: married  Name of spouse [name] If a parent, number of children / ages:Two girls, ages 74 and 61  Support Systems: spouse  Surveyor, Quantity Stress:  Yes   Income/Employment/Disability: Software Engineer: unknown  Educational History: Education: unknown  Oncologist: unknown  Any cultural differences that may affect / interfere with treatment:  not applicable   Recreation/Hobbies: golfing  Stressors: Financial difficulties   Health problems    Strengths: Family  Barriers:  severe physical limitations that compromises activity   Legal History: Pending legal issue / charges: The patient has no significant history of legal issues. History of legal issue / charges: N/A  Medical History/Surgical History: reviewed Past Medical History:  Diagnosis Date   Anxiety    Arthritis, rheumatoid (HCC)    dx 1988   CAD (coronary artery disease), native coronary artery    Mild LAD disease with calcification noted at cath 12//27/16    Cardiomyopathy (HCC)    Cardiomyopathy (HCC)    Chronic systolic heart failure (HCC) 05/01/2015   Depression    Deviated nasal septum 06/25/2011   Headache    High cholesterol    Hyperlipidemia    Impingement syndrome of left shoulder 12/22/2017   Macular degeneration    Nausea and vomiting 08/28/2022   Rheumatoid aortitis    Sleep apnea    does not use cpap - UNABLE TO TOLERATE MASK  Sleep apnea in adult    deviated septum repaired, most recent sleep study was negative   Status post total right knee replacement 06/01/2015    Past Surgical History:  Procedure Laterality Date   ANKLE FUSION Right 05/05/2012   related to his arthritis   ANKLE FUSION Left 05/25/2013   related to his arthritis   ANKLE SURGERY Bilateral    APPLICATION OF CRANIAL NAVIGATION N/A 04/11/2022   Procedure:  APPLICATION OF CRANIAL NAVIGATION;  Surgeon: Lanis Pupa, MD;  Location: [facility];  Service: Neurosurgery;  Laterality: N/A;   APPLICATION OF CRANIAL NAVIGATION N/A 08/08/2022   Procedure: APPLICATION OF CRANIAL NAVIGATION;  Surgeon: Lanis Pupa, MD;  Location: [facility] OR;  Service: Neurosurgery;  Laterality: N/A;   BIOPSY  06/09/2023   Procedure: BIOPSY;  Surgeon: Saintclair Jasper, MD;  Location: [facility];  Service: Gastroenterology;;   CARDIAC CATHETERIZATION N/A 05/01/2015   Procedure: Left Heart Cath and Coronary Angiography;  Surgeon: Victory LELON Sharps, MD;  Location: [facility] INVASIVE CV LAB;  Service: Cardiovascular;  Laterality: N/A;   COLONOSCOPY WITH PROPOFOL  N/A 06/09/2023   Procedure: COLONOSCOPY WITH PROPOFOL ;  Surgeon: Saintclair Jasper, MD;  Location: [facility];  Service: Gastroenterology;  Laterality: N/A;   CRANIOTOMY N/A 04/11/2022   Procedure: STEREOTACTIC SUBOCCIPITAL CRANIOTOMY FOR RESECTION OF ARTERIO-VENOUS MALFORMATION;  Surgeon: Lanis Pupa, MD;  Location: [facility];  Service: Neurosurgery;  Laterality: N/A;   ESOPHAGOGASTRODUODENOSCOPY (EGD) WITH PROPOFOL  N/A 06/09/2023   Procedure: ESOPHAGOGASTRODUODENOSCOPY (EGD) WITH PROPOFOL ;  Surgeon: Saintclair Jasper, MD;  Location: [facility];  Service: Gastroenterology;  Laterality: N/A;   FRACTURE SURGERY     left femur fracture x 3   HEMOSTASIS CLIP PLACEMENT  06/09/2023   Procedure: HEMOSTASIS CLIP PLACEMENT;  Surgeon: Saintclair Jasper, MD;  Location: [facility];  Service: Gastroenterology;;   IR ANGIO EXTERNAL CAROTID SEL EXT CAROTID BILAT MOD SED  01/28/2022   IR ANGIO INTRA EXTRACRAN SEL INTERNAL CAROTID BILAT MOD SED  01/28/2022   IR ANGIO INTRA EXTRACRAN SEL INTERNAL CAROTID BILAT MOD SED  04/16/2022   IR ANGIO VERTEBRAL SEL VERTEBRAL BILAT MOD SED  01/28/2022   IR ANGIO VERTEBRAL SEL VERTEBRAL BILAT MOD SED  04/16/2022   KNEE ARTHROSCOPY     right x 4   LAPAROSCOPIC REVISION VENTRICULAR-PERITONEAL (V-P) SHUNT N/A 08/08/2022   Procedure:  LAPAROSCOPIC VENTRICULAR-PERITONEAL (V-P) SHUNT;  Surgeon: Vanderbilt Ned, MD;  Location: [facility];  Service: General;  Laterality: N/A;   LUMBAR LAMINECTOMY/DECOMPRESSION MICRODISCECTOMY N/A 07/18/2022   Procedure: Repair of Pseudomeningiocele posterior;  Surgeon: Lanis Pupa, MD;  Location: [facility];  Service: Neurosurgery;  Laterality: N/A;   NASAL SEPTOPLASTY W/ TURBINOPLASTY  06/25/2011   Procedure: NASAL SEPTOPLASTY WITH TURBINATE REDUCTION;  Surgeon: Alm Bouche, MD;  [facility];  Service: ENT;  Laterality: Bilateral;   PLACEMENT OF LUMBAR DRAIN N/A 07/18/2022   Procedure: PLACEMENT OF LUMBAR DRAIN;  Surgeon: Lanis Pupa, MD;  Location: [facility];  Service: Neurosurgery;  Laterality: N/A;   POLYPECTOMY  06/09/2023   Procedure: POLYPECTOMY;  Surgeon: Saintclair Jasper, MD;  Location: [facility];  Service: Gastroenterology;;   TONSILLECTOMY     TOTAL KNEE ARTHROPLASTY Right 06/01/2015   Procedure: RIGHT TOTAL KNEE ARTHROPLASTY;  Surgeon: Lonni CINDERELLA Poli, MD;  Location: [facility];  Service: Orthopedics;  Laterality: Right;  Block+general   VENTRICULOPERITONEAL SHUNT Right 08/08/2022   Procedure: LAP ASSITED SHUNT INSERTION VENTRICULAR-PERITONEAL;  Surgeon: Lanis Pupa, MD;  Location: [facility];  Service: Neurosurgery;  Laterality: Right;    Medications: Current Outpatient Medications  Medication Sig Dispense Refill   acetaminophen  (TYLENOL ) 325 MG tablet Take 2 tablets (650 mg total) by mouth 3 (three) times daily.     alendronate (FOSAMAX) 10 MG tablet Take 10 mg by mouth daily before breakfast. Take with a full glass of water  on an empty stomach.     artificial tears (LACRILUBE) OINT ophthalmic ointment Place into both eyes 2 (two) times daily.     aspirin  EC 81 MG tablet Take 1 tablet (81 mg total) by mouth daily. Swallow whole. 30 tablet 12   calcium  carbonate (TUMS - DOSED IN MG ELEMENTAL CALCIUM ) 500 MG chewable tablet Chew 1 tablet (200 mg of elemental calcium  total) by  mouth daily with supper.     Cholecalciferol (VITAMIN D3) 50 MCG (2000 UT) TABS Take 2,000 Units by mouth daily.     famotidine (PEPCID) 40 MG tablet 1 tablet Orally Once a day for 30 days     folic acid  (FOLVITE ) 1 MG tablet Take 1 mg by mouth daily.     methotrexate  (RHEUMATREX) 2.5 MG tablet Take 20 mg by mouth once a week. Takes 8 tablets(20mg ) every Saturday.     Caution:Chemotherapy. Protect from light.     metoprolol  succinate (TOPROL -XL) 25 MG 24 hr tablet TAKE 1 TABLET DAILY 90 tablet 3   Multiple Vitamins-Minerals (PRESERVISION AREDS 2 PO) Take 2 capsules by mouth daily.     predniSONE  (STERAPRED UNI-PAK 21 TAB) 5 MG (21) TBPK tablet Take 5 mg by mouth every morning.     RITUXAN  500 MG/50ML injection See admin instructions. Every 6 months 2 dose infusion Ruxience      rosuvastatin  (CRESTOR ) 20 MG tablet Take 1 tablet (20 mg total) by mouth daily. Patient must schedule annual appointment for further refills first attempt 30 tablet 0   sertraline  (ZOLOFT ) 20 MG/ML concentrated solution Take 25 mg by mouth daily.     spironolactone  (ALDACTONE ) 25 MG tablet 12.5 mg. 1/2 tab daily     sulfaSALAzine  (AZULFIDINE ) 500 MG tablet Take 1,000 mg by mouth 2 (two) times daily.     traMADol  (ULTRAM ) 50 MG tablet Take 1 tablet (50 mg total) by mouth every 12 (twelve) hours as needed for moderate pain. (Patient taking differently: Take 50 mg by mouth as needed for moderate pain (pain score 4-6).) 60 tablet 2   traZODone  (DESYREL ) 50 MG tablet TAKE 1.5 TABLETS (75 MG TOTAL) BY MOUTH AT BEDTIME AS NEEDED. FOR SLEEP 135 tablet 5   White Petrolatum -Mineral Oil (GENTEAL TEARS NIGHT-TIME OP) Apply to eye at bedtime.     No current facility-administered medications for this visit.    Allergies  Allergen Reactions   Hydroxychloroquine      Other Reaction(s): cardiolmyopathy    Diagnoses:  Major depression  Plan of Care: Outpatient psychotherapy and medication management as needed. Initial Note: (With  wife [name]). She says that she has hoped Christopher Yu would get into therapy. He has Ataxic Tremor. He told Dr. Evonnie that he would like medication to just sit and do nothing. He says he always had golf to rely on and now cannot play. In December, he went n for a surgery to remove an avm from brain. Was told it was a routine surgery and was told that he needs to get it out. Was told it would be a short recovery and he would be back to work. 1 1/2 hour surgery turned into 6 hour surgery and he had complications. He had extra cerebral fluid and Dr. said it would  absorb and just wait. He kept declining and by March, they went in to try and drain the extra fluid. He was in hospital for a week. Wife noticed that during the hospital stay, his speech declined and he was clearly weak. Nurses were trying to discharge him and she protested, but they sent him home anyway. He declined, and could not sit, stand, talk or walk and she ended up calling ambulance. In ER, she ws told he had a stroke. He was in ER 24 hours before getting a room in Neuro area. Head of stroke team came in and said it was not a stroke, but complications from surgery. She was blamed by medical team for not speaking up before being initially discharged. He ended up in a rehab for 2 weeks and told them he needed another surgery. Was told he had hydrocephalus and need a shunt to drain liquid off his brain. She felt a lot of pressure from team that he needed surgery. She wanted a second opinion and had rouble getting it. Spoke to another Dr. and they decided to get the third surgery. He was in OT,PT and Speech Therapy. During therapy, he started having a tremor. They ended up being referred to Dr. Evonnie, who has now told him that treating his tremor is tricky and will take time.  [place] is no longer in OT,PT speech therapy. He is supposed to be doing exercises on his own, but has not been emotionally able to ge himself to do it. He has persistent headaches as well.  Just had a CAT Scan and awaiting the results. His headaches are worse and they are concerned. He also has rhumatory arthritis and it is flaring up because he had to stop meds. He has cardiomyopathy, macular degeneration on top of all of these other conditions.  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 He is still employed with insurance, but they fear he may be let go. He has been approved for disability and are working to get social security disability. She works part time. They have 2 daughters, one with diabetes and arthritis. She is heading to [employer] this Fall and wants to work in research scientist (physical sciences). Other daughter is 43 and is our introvert with lots of anxiety. She is in therapy and has mental health issues that is being treated. She will be a holiday representative at the middle college at [employer].  They are told that medicine will not control his condition, but they will have to try to retrain his brain. Suggested that they work to get back into OT and PT. She has a hard time motivating him. He admits that he keeps things inside. She says he is not a good comunicator. He will not share things like if he is in pain or hurts himself.   Tx. Plan/Goals: Patient reports depressive symptoms secondary to the medical trauma he has suffered and the debilitating physical results. He is seeking therapy to resolve his feelings of helplessness and hopelessness. Also want assistance in improving his drive/motivation in daily activity. Will engage in therapy that utilizes behavioral approaches to mitigate symptoms along with insight oriented counseling. Will also incorporate some conjoint sessions with spouse as needed, given her integral involvement with his care. Patient agrees with plan for treatment. Goal Date is 12-25    Patient agrees to a forensic psychologist Public Affairs Consultant) session. He understands limitations of platform. He is at home and provider in his office.   Session Note: [place] states that not much has  changed and he is doing  good. He will meet with Dr. Evonnie on Dec. 3rd. He talked about the pain in his hands from arthritis and his prior sugeries to fix it. He claims he is not bored, but is doing very little. He has settled into a routine.Discussed his difficulty with speech today, and says that he has difficult days periodically. Talked about how he manages and feelings associated                                                                                                                                                 CONI ALM KERNS, PhD 4:15p-5:00p 45 minutes

## 2024-03-07 DIAGNOSIS — M810 Age-related osteoporosis without current pathological fracture: Secondary | ICD-10-CM | POA: Diagnosis not present

## 2024-03-08 DIAGNOSIS — R2689 Other abnormalities of gait and mobility: Secondary | ICD-10-CM | POA: Diagnosis not present

## 2024-03-21 DIAGNOSIS — I429 Cardiomyopathy, unspecified: Secondary | ICD-10-CM | POA: Diagnosis not present

## 2024-03-21 DIAGNOSIS — I69398 Other sequelae of cerebral infarction: Secondary | ICD-10-CM | POA: Diagnosis not present

## 2024-03-21 DIAGNOSIS — Q2381 Bicuspid aortic valve: Secondary | ICD-10-CM | POA: Diagnosis not present

## 2024-03-21 DIAGNOSIS — I77810 Thoracic aortic ectasia: Secondary | ICD-10-CM | POA: Diagnosis not present

## 2024-03-27 ENCOUNTER — Encounter (HOSPITAL_BASED_OUTPATIENT_CLINIC_OR_DEPARTMENT_OTHER): Payer: Self-pay | Admitting: Emergency Medicine

## 2024-03-27 ENCOUNTER — Emergency Department (HOSPITAL_BASED_OUTPATIENT_CLINIC_OR_DEPARTMENT_OTHER)
Admission: EM | Admit: 2024-03-27 | Discharge: 2024-03-27 | Disposition: A | Discharge status: Home / Self Care | Attending: Emergency Medicine | Admitting: Emergency Medicine

## 2024-03-27 ENCOUNTER — Other Ambulatory Visit: Payer: Self-pay

## 2024-03-27 DIAGNOSIS — X58XXXA Exposure to other specified factors, initial encounter: Secondary | ICD-10-CM | POA: Diagnosis not present

## 2024-03-27 DIAGNOSIS — S0501XA Injury of conjunctiva and corneal abrasion without foreign body, right eye, initial encounter: Secondary | ICD-10-CM | POA: Insufficient documentation

## 2024-03-27 DIAGNOSIS — Z7982 Long term (current) use of aspirin: Secondary | ICD-10-CM | POA: Diagnosis not present

## 2024-03-27 DIAGNOSIS — H5789 Other specified disorders of eye and adnexa: Secondary | ICD-10-CM | POA: Diagnosis not present

## 2024-03-27 DIAGNOSIS — H1131 Conjunctival hemorrhage, right eye: Secondary | ICD-10-CM | POA: Insufficient documentation

## 2024-03-27 MED ORDER — TETRACAINE HCL 0.5 % OP SOLN
2.0000 [drp] | Freq: Once | OPHTHALMIC | Status: DC
  Filled 2024-03-27: qty 4

## 2024-03-27 MED ORDER — FLUORESCEIN SODIUM 1 MG OP STRP
1.0000 | ORAL_STRIP | Freq: Once | OPHTHALMIC | Status: DC
  Filled 2024-03-27: qty 1

## 2024-03-27 MED ORDER — TOBRAMYCIN 0.3 % OP SOLN
2.0000 [drp] | Freq: Once | OPHTHALMIC | Status: AC
  Administered 2024-03-27: 2 [drp] via OPHTHALMIC
  Filled 2024-03-27: qty 5

## 2024-03-27 NOTE — Discharge Instructions (Signed)
 It was our pleasure to provide your ER care today - we hope that you feel better.  Use tobrex  eye drops 1-2 drops in right eye 4x/day for the next 5 days. Avoid rubbing eye.   Follow up with your doctor in one week if symptoms fail to improve/resolve.  Return to ER if worse, new symptoms, fevers, increased swelling, spreading redness, severe pain, change in vision, or other concern.

## 2024-03-27 NOTE — ED Provider Notes (Signed)
 [place] EMERGENCY DEPARTMENT AT [facility] Provider Note   CSN: [id number] Arrival date & time: 03/27/24  1247     Patient presents with: Eye Injury   Christopher Yu is a 63 y.o. male.   Pt c/o accidentally poking self in right eye yesterday. Has mild irritation to eye, and has subconjunctival hemorrhage. No anticoagulant use or abnormal bruising/bleeding. No acute change in vision. No severe eye pain.   The history is provided by the patient and a relative.  Eye Injury       Prior to Admission medications   Medication Sig Start Date End Date Taking? Authorizing Provider  acetaminophen  (TYLENOL ) 325 MG tablet Take 2 tablets (650 mg total) by mouth 3 (three) times daily. 08/08/22   Angiulli, Daniel J, PA-C  alendronate (FOSAMAX) 10 MG tablet Take 10 mg by mouth daily before breakfast. Take with a full glass of water  on an empty stomach.    [provider]  artificial tears (LACRILUBE) OINT ophthalmic ointment Place into both eyes 2 (two) times daily. 08/08/22   Angiulli, Toribio PARAS, PA-C  aspirin  EC 81 MG tablet Take 1 tablet (81 mg total) by mouth daily. Swallow whole. 08/25/22   Angiulli, Toribio PARAS, PA-C  calcium  carbonate (TUMS - DOSED IN MG ELEMENTAL CALCIUM ) 500 MG chewable tablet Chew 1 tablet (200 mg of elemental calcium  total) by mouth daily with supper. 08/08/22   Angiulli, Toribio PARAS, PA-C  Cholecalciferol (VITAMIN D3) 50 MCG (2000 UT) TABS Take 2,000 Units by mouth daily.    [provider]  famotidine (PEPCID) 40 MG tablet 1 tablet Orally Once a day for 30 days    [provider]  folic acid  (FOLVITE ) 1 MG tablet Take 1 mg by mouth daily.    [provider]  methotrexate  (RHEUMATREX) 2.5 MG tablet Take 20 mg by mouth once a week. Takes 8 tablets(20mg ) every Saturday.     Caution:Chemotherapy. Protect from light.    [provider]  metoprolol  succinate (TOPROL -XL) 25 MG 24 hr tablet TAKE 1 TABLET DAILY 03/30/23    Varanasi, Jayadeep S, MD  Multiple Vitamins-Minerals (PRESERVISION AREDS 2 PO) Take 2 capsules by mouth daily.    [provider]  predniSONE  (STERAPRED UNI-PAK 21 TAB) 5 MG (21) TBPK tablet Take 5 mg by mouth every morning. 04/29/22   [provider]  RITUXAN  500 MG/50ML injection See admin instructions. Every 6 months 2 dose infusion Ruxience  06/14/19   [provider]  rosuvastatin  (CRESTOR ) 20 MG tablet Take 1 tablet (20 mg total) by mouth daily. Patient must schedule annual appointment for further refills first attempt 10/30/23   Dann Candyce RAMAN, MD  sertraline  (ZOLOFT ) 20 MG/ML concentrated solution Take 25 mg by mouth daily. 08/26/22   [provider]  spironolactone  (ALDACTONE ) 25 MG tablet 12.5 mg. 1/2 tab daily 08/26/22   [provider]  sulfaSALAzine  (AZULFIDINE ) 500 MG tablet Take 1,000 mg by mouth 2 (two) times daily.    [provider]  traMADol  (ULTRAM ) 50 MG tablet Take 1 tablet (50 mg total) by mouth every 12 (twelve) hours as needed for moderate pain. Patient taking differently: Take 50 mg by mouth as needed for moderate pain (pain score 4-6). 02/04/23   Emeline Joesph BROCKS, DO  traZODone  (DESYREL ) 50 MG tablet TAKE 1.5 TABLETS (75 MG TOTAL) BY MOUTH AT BEDTIME AS NEEDED. FOR SLEEP 04/10/23   Emeline Joesph BROCKS, DO  White Petrolatum -Mineral Oil (GENTEAL TEARS NIGHT-TIME OP) Apply to eye at  bedtime.    [provider]    Allergies: Hydroxychloroquine     Review of Systems  Constitutional:  Negative for fever.  Eyes:  Positive for redness.    Updated Vital Signs BP (!) 135/96 (BP Location: Left Arm)   Pulse 72   Temp 98.5 F (36.9 C) (Oral)   Resp 16   Ht 1.88 m (6' 2)   Wt 86.2 kg   SpO2 96%   BMI 24.39 kg/m   Physical Exam Vitals and nursing note reviewed.  Constitutional:      Appearance: Normal appearance. He is well-developed.  HENT:     Nose: Nose normal.     Mouth/Throat:     Mouth: Mucous  membranes are moist.     Pharynx: Oropharynx is clear.  Eyes:     General: No scleral icterus.    Extraocular Movements: Extraocular movements intact.     Pupils: Pupils are equal, round, and reactive to light.     Comments: Right eye subconjunctival hemorrhage.   Neck:     Trachea: No tracheal deviation.  Cardiovascular:     Rate and Rhythm: Normal rate.  Pulmonary:     Effort: Pulmonary effort is normal. No accessory muscle usage or respiratory distress.  Musculoskeletal:     Cervical back: Neck supple.  Skin:    General: Skin is warm and dry.     Findings: No rash.  Neurological:     Mental Status: He is alert.     Comments: Alert, speech clear. Pt notes vision appears grossly normal/intact.   Psychiatric:        Mood and Affect: Mood normal.     (all labs ordered are listed, but only abnormal results are displayed) Labs Reviewed - No data to display  EKG: None  Radiology: No results found.   Procedures   Medications Ordered in the ED  tetracaine  (PONTOCAINE) 0.5 % ophthalmic solution 2 drop (has no administration in time range)  fluorescein  ophthalmic strip 1 strip (has no administration in time range)  tobramycin  (TOBREX ) 0.3 % ophthalmic solution 2 drop (has no administration in time range)                                    Medical Decision Making Problems Addressed: Abrasion of right cornea, initial encounter: acute illness or injury Subconjunctival hemorrhage of right eye: acute illness or injury  Amount and/or Complexity of Data Reviewed Independent Historian:     Details: Family member, hx  Risk Prescription drug management.   Tetracaine  drops. Fluorescein  applied. Small corneal abrasion in 6 oclock position.   Tobrex  gtts.   Pt appear stable for ED d/c.         Final diagnoses:  Subconjunctival hemorrhage of right eye  Abrasion of right cornea, initial encounter    ED Discharge Orders     None          Bernard Drivers,  MD 03/27/24 1321

## 2024-03-27 NOTE — ED Triage Notes (Signed)
 Pt Christopher Yu ambulatory reporting he accidentally poked himself in the R eye with his finger last night and family was concerned this morning because of how red the eye was. Pt denies any visual changes and denies pain at present.

## 2024-03-30 ENCOUNTER — Ambulatory Visit: Admitting: Psychology

## 2024-03-30 DIAGNOSIS — R2689 Other abnormalities of gait and mobility: Secondary | ICD-10-CM | POA: Diagnosis not present

## 2024-04-04 DIAGNOSIS — R2689 Other abnormalities of gait and mobility: Secondary | ICD-10-CM | POA: Diagnosis not present

## 2024-04-04 NOTE — Progress Notes (Unsigned)
 Assessment/Plan:   1.  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  He then came back with an acute infarct in the right cerebellar hemisphere.  He subsequently developed a large cystic lesion that created mass effect and subsequent hydrocephalus, requiring VP shunt.  He continues to have a large pseudomeningocele, last measured at 7.8 cm x 5.9 cm.   -Discussed with patient that ataxic tremor is notoriously difficult to treat and many times does not resolve with any medication.  We discussed today trialing levodopa to see if it would be of benefit at all.  - Discussed with patient that I have discussed his case with Dr. Luciana at [place].  I am not sure that Botox will help the patient at all, as these tremors can be so difficult, but with the patient's permission today I went ahead and did video  - Discussed that [facility] down in [place] is specifically doing a physical therapy program for golfing.  I know this is a ways away, but the patient may find this quite interesting and useful  2.  Depression, as a consequence of the above  - Doing counseling with Dr. Gutterman.   Subjective:   Christopher Yu was seen in follow-up today for [facility] tremor.   Allergies  Allergen Reactions   Hydroxychloroquine      Other Reaction(s): cardiolmyopathy    No outpatient medications have been marked as taking for the 04/06/24 encounter (Appointment) with Kimoni Pickerill, Asberry RAMAN, DO.      Objective:   VITALS:   There were no vitals filed for this visit.  Gen:  Appears stated age and in NAD.   Intermittently frustrated with situation and tearful HEENT:  Normocephalic, atraumatic. The mucous membranes are moist. The superficial temporal arteries are without ropiness or tenderness. Cardiovascular: Regular rate and rhythm. Lungs: Clear  to auscultation bilaterally. Neck: There are no carotid bruits noted bilaterally.  NEUROLOGICAL:  Orientation:  The patient is alert and oriented x 3.  Cranial nerves: There is good facial symmetry. Extraocular muscles are intact and visual fields are full to confrontational testing. He has scanning type of speech.  Soft palate rises symmetrically and there is no tongue deviation. Hearing is intact to conversational tone. Tone: Tone is good throughout. Sensation: Sensation is intact to light touch touch throughout (facial, trunk, extremities). Vibration is intact at the bilateral ankle. There is no extinction with double simultaneous stimulation. There is no sensory dermatomal level identified. Coordination:  The patient has no decremation with rapid alternating movements.  He is ataxic with finger-to-nose on the left. Motor: Strength is 5/5 in the bilateral upper and lower extremities.  Shoulder shrug is equal bilaterally.  There is no pronator drift.  There are no fasciculations noted. DTR's: Deep tendon reflexes are 2/4 at the bilateral biceps, triceps, brachioradialis right patella, 3/4 at the left patella. Plantar response is up on the left and down on the right Gait and Station: The patient pushes off the chair to arise.  He ambulates with his cane.  He is just slightly ataxic  MOVEMENT EXAM: Tremor:  There is no rest or postural tremor.  He is ataxic with finger-to-nose on the left.  He has trouble getting the pen to the paper with the left hand when asked to do Archimedes spirals.      I have reviewed and interpreted the following labs independently   Chemistry  Component Value Date/Time   NA 141 08/27/2022 1910   K 3.6 08/27/2022 1910   CL 106 08/27/2022 1910   CO2 24 08/27/2022 1910   BUN 14 08/27/2022 1910   CREATININE 0.78 08/27/2022 1910      Component Value Date/Time   CALCIUM  9.3 08/27/2022 1910   ALKPHOS 83 08/27/2022 1910   AST 21 08/27/2022 1910   ALT 27  08/27/2022 1910   BILITOT 0.7 08/27/2022 1910      Lab Results  Component Value Date   WBC 8.1 08/27/2022   HGB 10.5 (L) 08/27/2022   HCT 33.7 (L) 08/27/2022   MCV 75.2 (L) 08/27/2022   PLT 192 08/27/2022   No results found for: TSH    Total time spent on today's visit was *** minutes, including both face-to-face time and nonface-to-face time.  Time included that spent on review of records (prior notes available to me/labs/imaging if pertinent), discussing treatment and goals, answering patient's questions and coordinating care.  CC:  Dwight Trula SQUIBB, MD

## 2024-04-05 DIAGNOSIS — Q2381 Bicuspid aortic valve: Secondary | ICD-10-CM | POA: Diagnosis not present

## 2024-04-05 DIAGNOSIS — I77810 Thoracic aortic ectasia: Secondary | ICD-10-CM | POA: Diagnosis not present

## 2024-04-06 ENCOUNTER — Encounter: Payer: Self-pay | Admitting: Neurology

## 2024-04-06 ENCOUNTER — Ambulatory Visit: Admitting: Psychology

## 2024-04-06 ENCOUNTER — Ambulatory Visit (INDEPENDENT_AMBULATORY_CARE_PROVIDER_SITE_OTHER): Admitting: Neurology

## 2024-04-06 VITALS — BP 128/82 | HR 72 | Ht 74.0 in | Wt 196.0 lb

## 2024-04-06 DIAGNOSIS — F330 Major depressive disorder, recurrent, mild: Secondary | ICD-10-CM

## 2024-04-06 DIAGNOSIS — R270 Ataxia, unspecified: Secondary | ICD-10-CM

## 2024-04-06 DIAGNOSIS — G9782 Other postprocedural complications and disorders of nervous system: Secondary | ICD-10-CM

## 2024-04-06 DIAGNOSIS — R9402 Abnormal brain scan: Secondary | ICD-10-CM

## 2024-04-06 DIAGNOSIS — F321 Major depressive disorder, single episode, moderate: Secondary | ICD-10-CM | POA: Diagnosis not present

## 2024-04-06 NOTE — Patient Instructions (Addendum)
 [email or web address] And mychart.  VISIT SUMMARY: Today, we discussed your ongoing issues with Holmes tremor and the large cerebellar pseudomeningocele following your previous surgeries. We reviewed your current symptoms, including the impact of the tremor on your daily activities and sleep patterns. We also discussed potential treatment options and follow-up care.  YOUR PLAN: -HOLMES TREMOR: Holmes tremor is a type of tremor that occurs due to damage in the brain, often affecting coordination and movement. We have started you on a trial of carbidopa-levodopa to see if it helps manage your tremor. You will take half a tablet at 8 AM, noon, and 4 PM for the first week, then adjust the dosage as instructed. We have also sent a video of your tremor to a movement disorder expert for further evaluation and provided a referral for a physical therapy program to help with your golfing.  -LARGE CEREBELLAR PSEUDOMENINGOCELE: A cerebellar pseudomeningocele is a fluid-filled sac that can form after brain surgery. It is important to monitor this condition regularly. We recommend that you have an annual follow-up with your neurosurgeon to evaluate the status of your shunt and ensure there are no complications.  INSTRUCTIONS: Please follow the medication schedule for carbidopa-levodopa as discussed. Schedule an annual follow-up appointment with your neurosurgeon to check on your shunt. If you are interested in the physical therapy program for golfing, please contact [place] for more information.  Start Carbidopa Levodopa as follows: Take 1/2 tablet three times daily, at least 30 minutes before meals (approximately 7am/11am/4pm), for one week Then take 1/2 tablet in the morning, 1/2 tablet in the afternoon, 1 tablet in the evening, at least 30 minutes before meals, for one week Then take 1/2 tablet in the morning, 1 tablet in the afternoon, 1 tablet in the evening, at least 30 minutes before  meals, for one week Then take 1 tablet three times daily at 7am/11am/4pm, at least 30 minutes before meals   As a reminder, carbidopa/levodopa can be taken at the same time as a carbohydrate, but we like to have you take your pill either 30 minutes before a protein source or 1 hour after as protein can interfere with carbidopa/levodopa absorption.                       Contains text generated by [employer]bridge.                                 Contains text generated by Abridge.

## 2024-04-06 NOTE — Progress Notes (Signed)
 [facility] Counselor Initial Adult Exam  Name: Christopher Yu Date: 04/06/2024 MRN: [id number] DOB: November 01, 1960 PCP: Dwight Trula SQUIBB, MD    Guardian/Payee:  N/A    Paperwork requested: No   Reason for Visit Christopher Yu Problem: Adjustment to serious medical illness  Mental Status Exam: Appearance:   Casual     Behavior:  Appropriate  Motor:  Shuffling Gait  Speech/Language:   Slurred  Affect:  Depressed  Mood:  depressed  Thought process:  normal  Thought content:    WNL  Sensory/Perceptual disturbances:    WNL  Orientation:  oriented to person, place, and situation  Attention:  Good  Concentration:  Good  Memory:  WNL  Fund of knowledge:   Good  Insight:    Good  Judgment:   unknown  Impulse Control:  unknown    Reported Symptoms:  Sadness, helplessness and hopelessness  Risk Assessment: Danger to Self:  No Self-injurious Behavior: No Danger to Others: No Duty to Warn:no Physical Aggression / Violence:No  Access to Firearms a concern: Unknown Gang Involvement:No  Patient / guardian was educated about steps to take if suicide or homicide risk level increases between visits: n/a While future psychiatric events cannot be accurately predicted, the patient does not currently require acute inpatient psychiatric care and does not currently meet [place]  involuntary commitment criteria.  Substance Abuse History: Current substance abuse: No     Past Psychiatric History:   No previous psychological problems have been observed Outpatient Providers:N/A History of Psych Hospitalization: No  Psychological Testing: none   Abuse History:  Victim of:  N/A  Report needed: No. Victim of Neglect:No. Perpetrator of N/A  Witness / Exposure to Domestic Violence: No   Protective Services Involvement: No  Witness to [employer] Violence:  No   Family History:  Family History  Problem Relation Age of Onset    Other Father        MVA   Lupus Sister     Living situation: the patient lives with their family  Sexual Orientation: Straight  Relationship Status: married  Name of spouse Christopher Yu If a parent, number of children / ages:Two girls, ages 7 and 77  Support Systems: spouse  Surveyor, Quantity Stress:  Yes   Income/Employment/Disability: Software Engineer: unknown  Educational History: Education: unknown  Oncologist: unknown  Any cultural differences that may affect / interfere with treatment:  not applicable   Recreation/Hobbies: golfing  Stressors: Financial difficulties   Health problems    Strengths: Family  Barriers:  severe physical limitations that compromises activity   Legal History: Pending legal issue / charges: The patient has no significant history of legal issues. History of legal issue / charges: N/A  Medical History/Surgical History: reviewed Past Medical History:  Diagnosis Date   Anxiety    Arthritis, rheumatoid (HCC)    dx 1988   CAD (coronary artery disease), native coronary artery    Mild LAD disease with calcification noted at cath 12//27/16    Cardiomyopathy (HCC)    Cardiomyopathy (HCC)    Chronic systolic heart failure (HCC) 05/01/2015   CVA (cerebral vascular accident) (HCC)    03/24   Depression    Deviated nasal septum 06/25/2011   Headache    High cholesterol    Hyperlipidemia    Impingement syndrome of left shoulder 12/22/2017   Macular degeneration    Nausea  and vomiting 08/28/2022   Rheumatoid aortitis    Sleep apnea    does not use cpap - UNABLE TO TOLERATE MASK   Sleep apnea in adult    deviated septum repaired, most recent sleep study was negative   Status post total right knee replacement 06/01/2015    Past Surgical History:  Procedure Laterality Date   ANKLE FUSION Right 05/05/2012   related to his arthritis   ANKLE FUSION Left 05/25/2013   related to his arthritis   ANKLE  SURGERY Bilateral    APPLICATION OF CRANIAL NAVIGATION N/A 04/11/2022   Procedure: APPLICATION OF CRANIAL NAVIGATION;  Surgeon: Lanis Pupa, MD;  Location: [facility] OR;  Service: Neurosurgery;  Laterality: N/A;   APPLICATION OF CRANIAL NAVIGATION N/A 08/08/2022   Procedure: APPLICATION OF CRANIAL NAVIGATION;  Surgeon: Lanis Pupa, MD;  Location: [facility] OR;  Service: Neurosurgery;  Laterality: N/A;   BIOPSY  06/09/2023   Procedure: BIOPSY;  Surgeon: Saintclair Jasper, MD;  Location: [facility];  Service: Gastroenterology;;   CARDIAC CATHETERIZATION N/A 05/01/2015   Procedure: Left Heart Cath and Coronary Angiography;  Surgeon: Victory LELON Sharps, MD;  Location: [facility] INVASIVE CV LAB;  Service: Cardiovascular;  Laterality: N/A;   COLONOSCOPY WITH PROPOFOL  N/A 06/09/2023   Procedure: COLONOSCOPY WITH PROPOFOL ;  Surgeon: Saintclair Jasper, MD;  Location: [facility];  Service: Gastroenterology;  Laterality: N/A;   CRANIOTOMY N/A 04/11/2022   Procedure: STEREOTACTIC SUBOCCIPITAL CRANIOTOMY FOR RESECTION OF ARTERIO-VENOUS MALFORMATION;  Surgeon: Lanis Pupa, MD;  Location: [facility] OR;  Service: Neurosurgery;  Laterality: N/A;   ESOPHAGOGASTRODUODENOSCOPY (EGD) WITH PROPOFOL  N/A 06/09/2023   Procedure: ESOPHAGOGASTRODUODENOSCOPY (EGD) WITH PROPOFOL ;  Surgeon: Saintclair Jasper, MD;  Location: [facility];  Service: Gastroenterology;  Laterality: N/A;   FRACTURE SURGERY     left femur fracture x 3   HEMOSTASIS CLIP PLACEMENT  06/09/2023   Procedure: HEMOSTASIS CLIP PLACEMENT;  Surgeon: Saintclair Jasper, MD;  Location: [facility];  Service: Gastroenterology;;   IR ANGIO EXTERNAL CAROTID SEL EXT CAROTID BILAT MOD SED  01/28/2022   IR ANGIO INTRA EXTRACRAN SEL INTERNAL CAROTID BILAT MOD SED  01/28/2022   IR ANGIO INTRA EXTRACRAN SEL INTERNAL CAROTID BILAT MOD SED  04/16/2022   IR ANGIO VERTEBRAL SEL VERTEBRAL BILAT MOD SED  01/28/2022   IR ANGIO VERTEBRAL SEL VERTEBRAL BILAT MOD SED  04/16/2022   KNEE ARTHROSCOPY     right x 4    LAPAROSCOPIC REVISION VENTRICULAR-PERITONEAL (V-P) SHUNT N/A 08/08/2022   Procedure: LAPAROSCOPIC VENTRICULAR-PERITONEAL (V-P) SHUNT;  Surgeon: Vanderbilt Ned, MD;  Location: [facility] OR;  Service: General;  Laterality: N/A;   LUMBAR LAMINECTOMY/DECOMPRESSION MICRODISCECTOMY N/A 07/18/2022   Procedure: Repair of Pseudomeningiocele posterior;  Surgeon: Lanis Pupa, MD;  Location: [facility];  Service: Neurosurgery;  Laterality: N/A;   NASAL SEPTOPLASTY W/ TURBINOPLASTY  06/25/2011   Procedure: NASAL SEPTOPLASTY WITH TURBINATE REDUCTION;  Surgeon: Alm Bouche, MD;  Location: [facility] OR;  Service: ENT;  Laterality: Bilateral;   PLACEMENT OF LUMBAR DRAIN N/A 07/18/2022   Procedure: PLACEMENT OF LUMBAR DRAIN;  Surgeon: Lanis Pupa, MD;  Location: [facility] OR;  Service: Neurosurgery;  Laterality: N/A;   POLYPECTOMY  06/09/2023   Procedure: POLYPECTOMY;  Surgeon: Saintclair Jasper, MD;  Location: [facility];  Service: Gastroenterology;;   TONSILLECTOMY     TOTAL KNEE ARTHROPLASTY Right 06/01/2015   Procedure: RIGHT TOTAL KNEE ARTHROPLASTY;  Surgeon: Lonni CINDERELLA Poli, MD;  Location: [facility];  Service: Orthopedics;  Laterality: Right;  Block+general   VENTRICULOPERITONEAL SHUNT Right 08/08/2022   Procedure: LAP  ASSITED SHUNT INSERTION VENTRICULAR-PERITONEAL;  Surgeon: Lanis Pupa, MD;  Location[facility] OR;  Service: Neurosurgery;  Laterality: Right;    Medications: Current Outpatient Medications  Medication Sig Dispense Refill   acetaminophen  (TYLENOL ) 325 MG tablet Take 2 tablets (650 mg total) by mouth 3 (three) times daily.     alendronate (FOSAMAX) 10 MG tablet Take 10 mg by mouth daily before breakfast. Take with a full glass of water  on an empty stomach.     artificial tears (LACRILUBE) OINT ophthalmic ointment Place into both eyes 2 (two) times daily.     aspirin  EC 81 MG tablet Take 1 tablet (81 mg total) by mouth daily. Swallow whole. 30 tablet 12   buPROPion (WELLBUTRIN XL) 150 MG 24 hr tablet Take  150 mg by mouth.     Calcium  200 MG TABS Calcium      calcium  carbonate (TUMS - DOSED IN MG ELEMENTAL CALCIUM ) 500 MG chewable tablet Chew 1 tablet (200 mg of elemental calcium  total) by mouth daily with supper.     Cholecalciferol (VITAMIN D3) 50 MCG (2000 UT) TABS Take 2,000 Units by mouth daily.     Cyanocobalamin (VITAMIN B12) 1000 MCG TBCR 3 tablets Orally once a week     famotidine (PEPCID) 40 MG tablet 1 tablet Orally Once a day for 30 days     ferrous sulfate 325 (65 FE) MG tablet Take 325 mg by mouth every other day.     folic acid  (FOLVITE ) 1 MG tablet Take 1 mg by mouth daily.     gabapentin  (NEURONTIN ) 300 MG capsule Take 1 capsule by mouth 2 (two) times daily.     Iron-Vit C-Vit B12-Folic Acid  100-250-0.025-1 MG TABS 1 tablet Orally Once a day     methotrexate  (RHEUMATREX) 2.5 MG tablet Take 20 mg by mouth once a week. Takes 8 tablets(20mg ) every Saturday.     Caution:Chemotherapy. Protect from light.     metoprolol  succinate (TOPROL -XL) 25 MG 24 hr tablet TAKE 1 TABLET DAILY 90 tablet 3   metoprolol  tartrate (LOPRESSOR ) 25 MG tablet Take 25 mg by mouth.     Multiple Vitamins-Minerals (MULTIVITAMIN ADULTS 50+) TABS Multivitamin     Multiple Vitamins-Minerals (PRESERVISION AREDS 2 PO) Take 2 capsules by mouth daily.     predniSONE  (STERAPRED UNI-PAK 21 TAB) 5 MG (21) TBPK tablet Take 5 mg by mouth every morning.     ramelteon (ROZEREM) 8 MG tablet Take 8 mg by mouth at bedtime as needed.     RITUXAN  500 MG/50ML injection See admin instructions. Every 6 months 2 dose infusion Ruxience      rosuvastatin  (CRESTOR ) 20 MG tablet Take 1 tablet (20 mg total) by mouth daily. Patient must schedule annual appointment for further refills first attempt 30 tablet 0   spironolactone  (ALDACTONE ) 25 MG tablet 12.5 mg. 1/2 tab daily     sulfaSALAzine  (AZULFIDINE ) 500 MG tablet Take 1,000 mg by mouth 2 (two) times daily.     traMADol  (ULTRAM ) 50 MG tablet Take 1 tablet (50 mg total) by mouth every 12  (twelve) hours as needed for moderate pain. (Patient taking differently: Take 50 mg by mouth as needed for moderate pain (pain score 4-6).) 60 tablet 2   traZODone  (DESYREL ) 50 MG tablet TAKE 1.5 TABLETS (75 MG TOTAL) BY MOUTH AT BEDTIME AS NEEDED. FOR SLEEP 135 tablet 5   TYRVAYA 0.03 MG/ACT SOLN      Vitamin D , Ergocalciferol , (DRISDOL ) 1.25 MG (50000 UNIT) CAPS capsule Take 50,000 Units by mouth.  White Petrolatum -Mineral Oil (GENTEAL TEARS NIGHT-TIME OP) Apply to eye at bedtime.     No current facility-administered medications for this visit.    Allergies  Allergen Reactions   Hydroxychloroquine      Other Reaction(s): cardiolmyopathy    Diagnoses:  Major depression  Plan of Care: Outpatient psychotherapy and medication management as needed. Initial Note: (With wife Christopher Yu). She says that she has hoped Christopher Yu would get into therapy. He has Ataxic Tremor. He told Dr. Evonnie that he would like medication to just sit and do nothing. He says he always had golf to rely on and now cannot play. In December, he went n for a surgery to remove an avm from brain. Was told it was a routine surgery and was told that he needs to get it out. Was told it would be a short recovery and he would be back to work. 1 1/2 hour surgery turned into 6 hour surgery and he had complications. He had extra cerebral fluid and Dr. said it would absorb and just wait. He kept declining and by March, they went in to try and drain the extra fluid. He was in hospital for a week. Wife noticed that during the hospital stay, his speech declined and he was clearly weak. Nurses were trying to discharge him and she protested, but they sent him home anyway. He declined, and could not sit, stand, talk or walk and she ended up calling ambulance. In ER, she ws told he had a stroke. He was in ER 24 hours before getting a room in Neuro area. Head of stroke team came in and said it was not a stroke, but complications from surgery. She was blamed  by medical team for not speaking up before being initially discharged. He ended up in a rehab for 2 weeks and told them he needed another surgery. Was told he had hydrocephalus and need a shunt to drain liquid off his brain. She felt a lot of pressure from team that he needed surgery. She wanted a second opinion and had rouble getting it. Spoke to another Dr. and they decided to get the third surgery. He was in OT,PT and Speech Therapy. During therapy, he started having a tremor. They ended up being referred to Dr. Evonnie, who has now told him that treating his tremor is tricky and will take time.  Christopher Yu is no longer in OT,PT speech therapy. He is supposed to be doing exercises on his own, but has not been emotionally able to ge himself to do it. He has persistent headaches as well. Just had a CAT Scan and awaiting the results. His headaches are worse and they are concerned. He also has rhumatory arthritis and it is flaring up because he had to stop meds. He has cardiomyopathy, macular degeneration on top of all of these other conditions.  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 He is still employed with insurance, but they fear he may be let go. He has been approved for disability and are working to get social security disability. She works part time. They have 2 daughters, one with diabetes and arthritis. She is heading to [employer] this Fall and wants to work in research scientist (physical sciences). Other daughter is 76 and is our introvert with lots of anxiety. She is in therapy and has mental health issues that is being treated. She will be a holiday representative at the middle college at [employer].  They are told that medicine will not control his condition, but they will  have to try to retrain his brain. Suggested that they work to get back into OT and PT. She has a hard time motivating him. He admits that he keeps things inside. She says he is not a good comunicator. He will not share things like if he is in pain or hurts himself.   Tx.  Plan/Goals: Patient reports depressive symptoms secondary to the medical trauma he has suffered and the debilitating physical results. He is seeking therapy to resolve his feelings of helplessness and hopelessness. Also want assistance in improving his drive/motivation in daily activity. Will engage in therapy that utilizes behavioral approaches to mitigate symptoms along with insight oriented counseling. Will also incorporate some conjoint sessions with spouse as needed, given her integral involvement with his care. Patient agrees with plan for treatment. Goal Date is 12-26    Patient agrees to a forensic psychologist Public Affairs Consultant) session. He understands limitations of platform. He is at home and provider in his office.   Session Note: Christopher Yu states that the family is busy preparing for the holidays. States that his rehab. Is going well. He scratched cornea and had to get treatment. He met with Dr. Evonnie this morning. She wants him to make a video that she can send to a doctor in [place] . She saw this doctor present and feels he might be able to help Christopher Yu. The doctor works primarily with Parkinson's patients and found that botox works well for these patients.  Says that he is hopeful that his oldest daughter will come in for Christmas. He isn't wanting to travel.                                                                                                                                                   CONI ALM KERNS, PhD 4:15p-5:00p 45 minutes

## 2024-04-07 MED ORDER — CARBIDOPA-LEVODOPA 25-100 MG PO TABS: 1.0000 | ORAL_TABLET | Freq: Three times a day (TID) | ORAL | 1 refills | Status: AC

## 2024-04-13 ENCOUNTER — Ambulatory Visit: Admitting: Psychology

## 2024-04-13 DIAGNOSIS — F321 Major depressive disorder, single episode, moderate: Secondary | ICD-10-CM | POA: Diagnosis not present

## 2024-04-13 NOTE — Progress Notes (Signed)
 [facility] Counselor Initial Adult Exam  Name: Christopher Yu Date: 04/13/2024 MRN: [id number] DOB: [birth year] PCP: Dwight Trula SQUIBB, MD    Guardian/Payee:  N/A    Paperwork requested: No   Reason for Visit [name] Problem: Adjustment to serious medical illness  Mental Status Exam: Appearance:   Casual     Behavior:  Appropriate  Motor:  Shuffling Gait  Speech/Language:   Slurred  Affect:  Depressed  Mood:  depressed  Thought process:  normal  Thought content:    WNL  Sensory/Perceptual disturbances:    WNL  Orientation:  oriented to person, place, and situation  Attention:  Good  Concentration:  Good  Memory:  WNL  Fund of knowledge:   Good  Insight:    Good  Judgment:   unknown  Impulse Control:  unknown    Reported Symptoms:  Sadness, helplessness and hopelessness  Risk Assessment: Danger to Self:  No Self-injurious Behavior: No Danger to Others: No Duty to Warn:no Physical Aggression / Violence:No  Access to Firearms a concern: Unknown Gang Involvement:No  Patient / guardian was educated about steps to take if suicide or homicide risk level increases between visits: n/a While future psychiatric events cannot be accurately predicted, the patient does not currently require acute inpatient psychiatric care and does not currently meet [facility]  involuntary commitment criteria.  Substance Abuse History: Current substance abuse: No     Past Psychiatric History:   No previous psychological problems have been observed Outpatient Providers:N/A History of Psych Hospitalization: No  Psychological Testing: none   Abuse History:  Victim of:  N/A  Report needed: No. Victim of Neglect:No. Perpetrator of N/A  Witness / Exposure to Domestic Violence: No   Protective Services Involvement: No  Witness to [employer] Violence:  No   Family History:  Family History   Problem Relation Age of Onset   Other Father        MVA   Lupus Sister     Living situation: the patient lives with their family  Sexual Orientation: Straight  Relationship Status: married  Name of spouse [name] If a parent, number of children / ages:Two girls, ages 25 and 59  Support Systems: spouse  Surveyor, Quantity Stress:  Yes   Income/Employment/Disability: Software Engineer: unknown  Educational History: Education: unknown  Oncologist: unknown  Any cultural differences that may affect / interfere with treatment:  not applicable   Recreation/Hobbies: golfing  Stressors: Financial difficulties   Health problems    Strengths: Family  Barriers:  severe physical limitations that compromises activity   Legal History: Pending legal issue / charges: The patient has no significant history of legal issues. History of legal issue / charges: N/A  Medical History/Surgical History: reviewed Past Medical History:  Diagnosis Date   Anxiety    Arthritis, rheumatoid (HCC)    dx 1988   CAD (coronary artery disease), native coronary artery    Mild LAD disease with calcification noted at cath 12//27/16    Cardiomyopathy [facility])    Cardiomyopathy (HCC)    Chronic systolic heart failure (HCC) 05/01/2015   CVA (cerebral vascular accident) (HCC)    03/24   Depression    Deviated nasal septum 06/25/2011   Headache    High cholesterol    Hyperlipidemia  Impingement syndrome of left shoulder 12/22/2017   Macular degeneration    Nausea and vomiting 08/28/2022   Rheumatoid aortitis    Sleep apnea    does not use cpap - UNABLE TO TOLERATE MASK   Sleep apnea in adult    deviated septum repaired, most recent sleep study was negative   Status post total right knee replacement 06/01/2015    Past Surgical History:  Procedure Laterality Date   ANKLE FUSION Right 05/05/2012   related to his arthritis   ANKLE FUSION Left 05/25/2013    related to his arthritis   ANKLE SURGERY Bilateral    APPLICATION OF CRANIAL NAVIGATION N/A 04/11/2022   Procedure: APPLICATION OF CRANIAL NAVIGATION;  Surgeon: Lanis Pupa, MD;  Location: [facility] OR;  Service: Neurosurgery;  Laterality: N/A;   APPLICATION OF CRANIAL NAVIGATION N/A 08/08/2022   Procedure: APPLICATION OF CRANIAL NAVIGATION;  Surgeon: Lanis Pupa, MD;  Location: [facility] OR;  Service: Neurosurgery;  Laterality: N/A;   BIOPSY  06/09/2023   Procedure: BIOPSY;  Surgeon: Saintclair Jasper, MD;  Location: [facility];  Service: Gastroenterology;;   CARDIAC CATHETERIZATION N/A 05/01/2015   Procedure: Left Heart Cath and Coronary Angiography;  Surgeon: Victory LELON Sharps, MD;  Location: [facility] INVASIVE CV LAB;  Service: Cardiovascular;  Laterality: N/A;   COLONOSCOPY WITH PROPOFOL  N/A 06/09/2023   Procedure: COLONOSCOPY WITH PROPOFOL ;  Surgeon: Saintclair Jasper, MD;  Location: [facility];  Service: Gastroenterology;  Laterality: N/A;   CRANIOTOMY N/A 04/11/2022   Procedure: STEREOTACTIC SUBOCCIPITAL CRANIOTOMY FOR RESECTION OF ARTERIO-VENOUS MALFORMATION;  Surgeon: Lanis Pupa, MD;  Location: [facility] OR;  Service: Neurosurgery;  Laterality: N/A;   ESOPHAGOGASTRODUODENOSCOPY (EGD) WITH PROPOFOL  N/A 06/09/2023   Procedure: ESOPHAGOGASTRODUODENOSCOPY (EGD) WITH PROPOFOL ;  Surgeon: Saintclair Jasper, MD;  Location: [facility];  Service: Gastroenterology;  Laterality: N/A;   FRACTURE SURGERY     left femur fracture x 3   HEMOSTASIS CLIP PLACEMENT  06/09/2023   Procedure: HEMOSTASIS CLIP PLACEMENT;  Surgeon: Saintclair Jasper, MD;  Location: [facility];  Service: Gastroenterology;;   IR ANGIO EXTERNAL CAROTID SEL EXT CAROTID BILAT MOD SED  01/28/2022   IR ANGIO INTRA EXTRACRAN SEL INTERNAL CAROTID BILAT MOD SED  01/28/2022   IR ANGIO INTRA EXTRACRAN SEL INTERNAL CAROTID BILAT MOD SED  04/16/2022   IR ANGIO VERTEBRAL SEL VERTEBRAL BILAT MOD SED  01/28/2022   IR ANGIO VERTEBRAL SEL VERTEBRAL BILAT MOD SED  04/16/2022    KNEE ARTHROSCOPY     right x 4   LAPAROSCOPIC REVISION VENTRICULAR-PERITONEAL (V-P) SHUNT N/A 08/08/2022   Procedure: LAPAROSCOPIC VENTRICULAR-PERITONEAL (V-P) SHUNT;  Surgeon: Vanderbilt Ned, MD;  Location: [facility];  Service: General;  Laterality: N/A;   LUMBAR LAMINECTOMY/DECOMPRESSION MICRODISCECTOMY N/A 07/18/2022   Procedure: Repair of Pseudomeningiocele posterior;  Surgeon: Lanis Pupa, MD;  Location: [facility];  Service: Neurosurgery;  Laterality: N/A;   NASAL SEPTOPLASTY W/ TURBINOPLASTY  06/25/2011   Procedure: NASAL SEPTOPLASTY WITH TURBINATE REDUCTION;  Surgeon: Alm Bouche, MD;  [facility] OR;  Service: ENT;  Laterality: Bilateral;   PLACEMENT OF LUMBAR DRAIN N/A 07/18/2022   Procedure: PLACEMENT OF LUMBAR DRAIN;  Surgeon: Lanis Pupa, MD;  Location: [facility];  Service: Neurosurgery;  Laterality: N/A;   POLYPECTOMY  06/09/2023   Procedure: POLYPECTOMY;  Surgeon: Saintclair Jasper, MD;  Location: [facility];  Service: Gastroenterology;;   TONSILLECTOMY     TOTAL KNEE ARTHROPLASTY Right 06/01/2015   Procedure: RIGHT TOTAL KNEE ARTHROPLASTY;  Surgeon: Lonni CINDERELLA Poli, MD;  Location: [facility];  Service: Orthopedics;  Laterality: Right;  Block+general   VENTRICULOPERITONEAL SHUNT Right 08/08/2022   Procedure: LAP ASSITED SHUNT INSERTION VENTRICULAR-PERITONEAL;  Surgeon: Lanis Pupa, MD;  Location: [facility];  Service: Neurosurgery;  Laterality: Right;    Medications: Current Outpatient Medications  Medication Sig Dispense Refill   acetaminophen  (TYLENOL ) 325 MG tablet Take 2 tablets (650 mg total) by mouth 3 (three) times daily.     alendronate (FOSAMAX) 10 MG tablet Take 10 mg by mouth daily before breakfast. Take with a full glass of water  on an empty stomach.     artificial tears (LACRILUBE) OINT ophthalmic ointment Place into both eyes 2 (two) times daily.     aspirin  EC 81 MG tablet Take 1 tablet (81 mg total) by mouth daily. Swallow whole. 30 tablet 12   buPROPion  (WELLBUTRIN XL) 150 MG 24 hr tablet Take 150 mg by mouth.     Calcium  200 MG TABS Calcium      calcium  carbonate (TUMS - DOSED IN MG ELEMENTAL CALCIUM ) 500 MG chewable tablet Chew 1 tablet (200 mg of elemental calcium  total) by mouth daily with supper.     carbidopa -levodopa  (SINEMET  IR) 25-100 MG tablet Take 1 tablet by mouth 3 (three) times daily. 7am/11am/4pm 270 tablet 1   Cholecalciferol (VITAMIN D3) 50 MCG (2000 UT) TABS Take 2,000 Units by mouth daily.     Cyanocobalamin (VITAMIN B12) 1000 MCG TBCR 3 tablets Orally once a week     famotidine (PEPCID) 40 MG tablet 1 tablet Orally Once a day for 30 days     ferrous sulfate 325 (65 FE) MG tablet Take 325 mg by mouth every other day.     folic acid  (FOLVITE ) 1 MG tablet Take 1 mg by mouth daily.     gabapentin  (NEURONTIN ) 300 MG capsule Take 1 capsule by mouth 2 (two) times daily.     Iron-Vit C-Vit B12-Folic Acid  100-250-0.025-1 MG TABS 1 tablet Orally Once a day     methotrexate  (RHEUMATREX) 2.5 MG tablet Take 20 mg by mouth once a week. Takes 8 tablets(20mg ) every Saturday.     Caution:Chemotherapy. Protect from light.     metoprolol  succinate (TOPROL -XL) 25 MG 24 hr tablet TAKE 1 TABLET DAILY 90 tablet 3   metoprolol  tartrate (LOPRESSOR ) 25 MG tablet Take 25 mg by mouth.     Multiple Vitamins-Minerals (MULTIVITAMIN ADULTS 50+) TABS Multivitamin     Multiple Vitamins-Minerals (PRESERVISION AREDS 2 PO) Take 2 capsules by mouth daily.     predniSONE  (STERAPRED UNI-PAK 21 TAB) 5 MG (21) TBPK tablet Take 5 mg by mouth every morning.     ramelteon (ROZEREM) 8 MG tablet Take 8 mg by mouth at bedtime as needed.     RITUXAN  500 MG/50ML injection See admin instructions. Every 6 months 2 dose infusion Ruxience      rosuvastatin  (CRESTOR ) 20 MG tablet Take 1 tablet (20 mg total) by mouth daily. Patient must schedule annual appointment for further refills first attempt 30 tablet 0   spironolactone  (ALDACTONE ) 25 MG tablet 12.5 mg. 1/2 tab daily      sulfaSALAzine  (AZULFIDINE ) 500 MG tablet Take 1,000 mg by mouth 2 (two) times daily.     traMADol  (ULTRAM ) 50 MG tablet Take 1 tablet (50 mg total) by mouth every 12 (twelve) hours as needed for moderate pain. (Patient taking differently: Take 50 mg by mouth as needed for moderate pain (pain score 4-6).) 60 tablet 2   traZODone  (DESYREL ) 50 MG tablet TAKE 1.5 TABLETS (75 MG TOTAL) BY MOUTH  AT BEDTIME AS NEEDED. FOR SLEEP 135 tablet 5   TYRVAYA 0.03 MG/ACT SOLN      Vitamin D , Ergocalciferol , (DRISDOL ) 1.25 MG (50000 UNIT) CAPS capsule Take 50,000 Units by mouth.     White Petrolatum -Mineral Oil (GENTEAL TEARS NIGHT-TIME OP) Apply to eye at bedtime.     No current facility-administered medications for this visit.    Allergies  Allergen Reactions   Hydroxychloroquine      Other Reaction(s): cardiolmyopathy    Diagnoses:  Major depression  Plan of Care: Outpatient psychotherapy and medication management as needed. Initial Note: (With wife [name]). She says that she has hoped Talbert would get into therapy. He has Ataxic Tremor. He told Dr. Evonnie that he would like medication to just sit and do nothing. He says he always had golf to rely on and now cannot play. In December, he went n for a surgery to remove an avm from brain. Was told it was a routine surgery and was told that he needs to get it out. Was told it would be a short recovery and he would be back to work. 1 1/2 hour surgery turned into 6 hour surgery and he had complications. He had extra cerebral fluid and Dr. said it would absorb and just wait. He kept declining and by March, they went in to try and drain the extra fluid. He was in hospital for a week. Wife noticed that during the hospital stay, his speech declined and he was clearly weak. Nurses were trying to discharge him and she protested, but they sent him home anyway. He declined, and could not sit, stand, talk or walk and she ended up calling ambulance. In ER, she ws told he had a  stroke. He was in ER 24 hours before getting a room in Neuro area. Head of stroke team came in and said it was not a stroke, but complications from surgery. She was blamed by medical team for not speaking up before being initially discharged. He ended up in a rehab for 2 weeks and told them he needed another surgery. Was told he had hydrocephalus and need a shunt to drain liquid off his brain. She felt a lot of pressure from team that he needed surgery. She wanted a second opinion and had rouble getting it. Spoke to another Dr. and they decided to get the third surgery. He was in OT,PT and Speech Therapy. During therapy, he started having a tremor. They ended up being referred to Dr. Evonnie, who has now told him that treating his tremor is tricky and will take time.  Griselda is no longer in OT,PT speech therapy. He is supposed to be doing exercises on his own, but has not been emotionally able to ge himself to do it. He has persistent headaches as well. Just had a CAT Scan and awaiting the results. His headaches are worse and they are concerned. He also has rhumatory arthritis and it is flaring up because he had to stop meds. He has cardiomyopathy, macular degeneration on top of all of these other conditions.  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 He is still employed with insurance, but they fear he may be let go. He has been approved for disability and are working to get social security disability. She works part time. They have 2 daughters, one with diabetes and arthritis. She is heading to [employer] this Fall and wants to work in research scientist (physical sciences). Other daughter is 83 and is our introvert with lots of  anxiety. She is in therapy and has mental health issues that is being treated. She will be a holiday representative at the middle college at [employer].  They are told that medicine will not control his condition, but they will have to try to retrain his brain. Suggested that they work to get back into OT and PT. She has a hard time  motivating him. He admits that he keeps things inside. She says he is not a good comunicator. He will not share things like if he is in pain or hurts himself.   Tx. Plan/Goals: Patient reports depressive symptoms secondary to the medical trauma he has suffered and the debilitating physical results. He is seeking therapy to resolve his feelings of helplessness and hopelessness. Also want assistance in improving his drive/motivation in daily activity. Will engage in therapy that utilizes behavioral approaches to mitigate symptoms along with insight oriented counseling. Will also incorporate some conjoint sessions with spouse as needed, given her integral involvement with his care. Patient agrees with plan for treatment. Goal Date is 12-26    Patient agrees to be been in the provider's office.    Session Note: Sparsh states he is on a new medication to help with his tremors. He reports that he has had a good week and that he has been able to walk around the house without his cane and not use furniture as a crutch. He is doing this on his own, but will ask the Physical Therapist if he should be doing this. He is taking steps to get back to better physical functioning. His moods seem stable and is not exhibiting any obvious symptoms of depression.                                                                                                                                                        CONI ALM KERNS, PhD 4:15p-5:00p 45 minutes

## 2024-04-18 DIAGNOSIS — R2689 Other abnormalities of gait and mobility: Secondary | ICD-10-CM | POA: Diagnosis not present

## 2024-04-20 ENCOUNTER — Ambulatory Visit: Admitting: Psychology

## 2024-05-02 DIAGNOSIS — R2689 Other abnormalities of gait and mobility: Secondary | ICD-10-CM | POA: Diagnosis not present

## 2024-05-11 ENCOUNTER — Ambulatory Visit: Admitting: Psychology

## 2024-05-18 ENCOUNTER — Ambulatory Visit: Admitting: Psychology

## 2024-05-18 DIAGNOSIS — F321 Major depressive disorder, single episode, moderate: Secondary | ICD-10-CM | POA: Diagnosis not present

## 2024-05-18 NOTE — Progress Notes (Signed)
 [facility] Counselor Initial Adult Exam  Name: Christopher Yu Date: 05/18/2024 MRN: [id number] DOB: [birth year] PCP: Dwight Trula SQUIBB, MD    Guardian/Payee:  N/A    Paperwork requested: No   Reason for Visit [name] Problem: Adjustment to serious medical illness  Mental Status Exam: Appearance:   Casual     Behavior:  Appropriate  Motor:  Shuffling Gait  Speech/Language:   Slurred  Affect:  Depressed  Mood:  depressed  Thought process:  normal  Thought content:    WNL  Sensory/Perceptual disturbances:    WNL  Orientation:  oriented to person, place, and situation  Attention:  Good  Concentration:  Good  Memory:  WNL  Fund of knowledge:   Good  Insight:    Good  Judgment:   unknown  Impulse Control:  unknown    Reported Symptoms:  Sadness, helplessness and hopelessness  Risk Assessment: Danger to Self:  No Self-injurious Behavior: No Danger to Others: No Duty to Warn:no Physical Aggression / Violence:No  Access to Firearms a concern: Unknown Gang Involvement:No  Patient / guardian was educated about steps to take if suicide or homicide risk level increases between visits: n/a While future psychiatric events cannot be accurately predicted, the patient does not currently require acute inpatient psychiatric care and does not currently meet [place]  involuntary commitment criteria.  Substance Abuse History: Current substance abuse: No     Past Psychiatric History:   No previous psychological problems have been observed Outpatient Providers:N/A History of Psych Hospitalization: No  Psychological Testing: none   Abuse History:  Victim of:  N/A  Report needed: No. Victim of Neglect:No. Perpetrator of N/A  Witness / Exposure to Domestic Violence: No   Protective Services Involvement: No  Witness to [employer] Violence:  No   Family  History:  Family History  Problem Relation Age of Onset   Other Father        MVA   Lupus Sister     Living situation: the patient lives with their family  Sexual Orientation: Straight  Relationship Status: married  Name of spouse [name] If a parent, number of children / ages:Two girls, ages 29 and 14  Support Systems: spouse  Surveyor, Quantity Stress:  Yes   Income/Employment/Disability: Software Engineer: unknown  Educational History: Education: unknown  Oncologist: unknown  Any cultural differences that may affect / interfere with treatment:  not applicable   Recreation/Hobbies: golfing  Stressors: Financial difficulties   Health problems    Strengths: Family  Barriers:  severe physical limitations that compromises activity   Legal History: Pending legal issue / charges: The patient has no significant history of legal issues. History of legal issue / charges: N/A  Medical History/Surgical History: reviewed Past Medical History:  Diagnosis Date   Anxiety    Arthritis, rheumatoid (HCC)    dx 1988   CAD (coronary artery disease), native coronary artery    Mild LAD disease with calcification noted at cath 12//27/16    Cardiomyopathy [facility])    Cardiomyopathy (HCC)    Chronic systolic heart failure (HCC) 05/01/2015   CVA (cerebral vascular accident) (HCC)    03/24   Depression    Deviated nasal  septum 06/25/2011   Headache    High cholesterol    Hyperlipidemia    Impingement syndrome of left shoulder 12/22/2017   Macular degeneration    Nausea and vomiting 08/28/2022   Rheumatoid aortitis    Sleep apnea    does not use cpap - UNABLE TO TOLERATE MASK   Sleep apnea in adult    deviated septum repaired, most recent sleep study was negative   Status post total right knee replacement 06/01/2015    Past Surgical History:  Procedure Laterality Date   ANKLE FUSION Right 05/05/2012   related to his arthritis   ANKLE  FUSION Left 05/25/2013   related to his arthritis   ANKLE SURGERY Bilateral    APPLICATION OF CRANIAL NAVIGATION N/A 04/11/2022   Procedure: APPLICATION OF CRANIAL NAVIGATION;  Surgeon: Lanis Pupa, MD;  Location: [facility] OR;  Service: Neurosurgery;  Laterality: N/A;   APPLICATION OF CRANIAL NAVIGATION N/A 08/08/2022   Procedure: APPLICATION OF CRANIAL NAVIGATION;  Surgeon: Lanis Pupa, MD;  Location: [facility] OR;  Service: Neurosurgery;  Laterality: N/A;   BIOPSY  06/09/2023   Procedure: BIOPSY;  Surgeon: Saintclair Jasper, MD;  Location: [facility];  Service: Gastroenterology;;   CARDIAC CATHETERIZATION N/A 05/01/2015   Procedure: Left Heart Cath and Coronary Angiography;  Surgeon: Victory LELON Sharps, MD;  Location: [facility] INVASIVE CV LAB;  Service: Cardiovascular;  Laterality: N/A;   COLONOSCOPY WITH PROPOFOL  N/A 06/09/2023   Procedure: COLONOSCOPY WITH PROPOFOL ;  Surgeon: Saintclair Jasper, MD;  Location: [facility];  Service: Gastroenterology;  Laterality: N/A;   CRANIOTOMY N/A 04/11/2022   Procedure: STEREOTACTIC SUBOCCIPITAL CRANIOTOMY FOR RESECTION OF ARTERIO-VENOUS MALFORMATION;  Surgeon: Lanis Pupa, MD;  Location: [facility] OR;  Service: Neurosurgery;  Laterality: N/A;   ESOPHAGOGASTRODUODENOSCOPY (EGD) WITH PROPOFOL  N/A 06/09/2023   Procedure: ESOPHAGOGASTRODUODENOSCOPY (EGD) WITH PROPOFOL ;  Surgeon: Saintclair Jasper, MD;  Location: [facility];  Service: Gastroenterology;  Laterality: N/A;   FRACTURE SURGERY     left femur fracture x 3   HEMOSTASIS CLIP PLACEMENT  06/09/2023   Procedure: HEMOSTASIS CLIP PLACEMENT;  Surgeon: Saintclair Jasper, MD;  Location: [facility];  Service: Gastroenterology;;   IR ANGIO EXTERNAL CAROTID SEL EXT CAROTID BILAT MOD SED  01/28/2022   IR ANGIO INTRA EXTRACRAN SEL INTERNAL CAROTID BILAT MOD SED  01/28/2022   IR ANGIO INTRA EXTRACRAN SEL INTERNAL CAROTID BILAT MOD SED  04/16/2022   IR ANGIO VERTEBRAL SEL VERTEBRAL BILAT MOD SED  01/28/2022   IR ANGIO VERTEBRAL SEL VERTEBRAL  BILAT MOD SED  04/16/2022   KNEE ARTHROSCOPY     right x 4   LAPAROSCOPIC REVISION VENTRICULAR-PERITONEAL (V-P) SHUNT N/A 08/08/2022   Procedure: LAPAROSCOPIC VENTRICULAR-PERITONEAL (V-P) SHUNT;  Surgeon: Vanderbilt Ned, MD;  Location: [facility];  Service: General;  Laterality: N/A;   LUMBAR LAMINECTOMY/DECOMPRESSION MICRODISCECTOMY N/A 07/18/2022   Procedure: Repair of Pseudomeningiocele posterior;  Surgeon: Lanis Pupa, MD;  Location: [facility];  Service: Neurosurgery;  Laterality: N/A;   NASAL SEPTOPLASTY W/ TURBINOPLASTY  06/25/2011   Procedure: NASAL SEPTOPLASTY WITH TURBINATE REDUCTION;  Surgeon: Alm Bouche, MD;  Location: [facility] OR;  Service: ENT;  Laterality: Bilateral;   PLACEMENT OF LUMBAR DRAIN N/A 07/18/2022   Procedure: PLACEMENT OF LUMBAR DRAIN;  Surgeon: Lanis Pupa, MD;  Location: [facility];  Service: Neurosurgery;  Laterality: N/A;   POLYPECTOMY  06/09/2023   Procedure: POLYPECTOMY;  Surgeon: Saintclair Jasper, MD;  Location: [facility];  Service: Gastroenterology;;   TONSILLECTOMY     TOTAL KNEE ARTHROPLASTY Right 06/01/2015   Procedure: RIGHT  TOTAL KNEE ARTHROPLASTY;  Surgeon: Lonni CINDERELLA Poli, MD;  Location: [facility];  Service: Orthopedics;  Laterality: Right;  Block+general   VENTRICULOPERITONEAL SHUNT Right 08/08/2022   Procedure: LAP ASSITED SHUNT INSERTION VENTRICULAR-PERITONEAL;  Surgeon: Lanis Pupa, MD;  Location: [facility] OR;  Service: Neurosurgery;  Laterality: Right;    Medications: Current Outpatient Medications  Medication Sig Dispense Refill   acetaminophen  (TYLENOL ) 325 MG tablet Take 2 tablets (650 mg total) by mouth 3 (three) times daily.     alendronate (FOSAMAX) 10 MG tablet Take 10 mg by mouth daily before breakfast. Take with a full glass of water  on an empty stomach.     artificial tears (LACRILUBE) OINT ophthalmic ointment Place into both eyes 2 (two) times daily.     aspirin  EC 81 MG tablet Take 1 tablet (81 mg total) by mouth daily. Swallow whole. 30  tablet 12   buPROPion (WELLBUTRIN XL) 150 MG 24 hr tablet Take 150 mg by mouth.     Calcium  200 MG TABS Calcium      calcium  carbonate (TUMS - DOSED IN MG ELEMENTAL CALCIUM ) 500 MG chewable tablet Chew 1 tablet (200 mg of elemental calcium  total) by mouth daily with supper.     carbidopa -levodopa  (SINEMET  IR) 25-100 MG tablet Take 1 tablet by mouth 3 (three) times daily. 7am/11am/4pm 270 tablet 1   Cholecalciferol (VITAMIN D3) 50 MCG (2000 UT) TABS Take 2,000 Units by mouth daily.     Cyanocobalamin (VITAMIN B12) 1000 MCG TBCR 3 tablets Orally once a week     famotidine (PEPCID) 40 MG tablet 1 tablet Orally Once a day for 30 days     ferrous sulfate 325 (65 FE) MG tablet Take 325 mg by mouth every other day.     folic acid  (FOLVITE ) 1 MG tablet Take 1 mg by mouth daily.     gabapentin  (NEURONTIN ) 300 MG capsule Take 1 capsule by mouth 2 (two) times daily.     Iron-Vit C-Vit B12-Folic Acid  100-250-0.025-1 MG TABS 1 tablet Orally Once a day     methotrexate  (RHEUMATREX) 2.5 MG tablet Take 20 mg by mouth once a week. Takes 8 tablets(20mg ) every Saturday.     Caution:Chemotherapy. Protect from light.     metoprolol  succinate (TOPROL -XL) 25 MG 24 hr tablet TAKE 1 TABLET DAILY 90 tablet 3   metoprolol  tartrate (LOPRESSOR ) 25 MG tablet Take 25 mg by mouth.     Multiple Vitamins-Minerals (MULTIVITAMIN ADULTS 50+) TABS Multivitamin     Multiple Vitamins-Minerals (PRESERVISION AREDS 2 PO) Take 2 capsules by mouth daily.     predniSONE  (STERAPRED UNI-PAK 21 TAB) 5 MG (21) TBPK tablet Take 5 mg by mouth every morning.     ramelteon (ROZEREM) 8 MG tablet Take 8 mg by mouth at bedtime as needed.     RITUXAN  500 MG/50ML injection See admin instructions. Every 6 months 2 dose infusion Ruxience      rosuvastatin  (CRESTOR ) 20 MG tablet Take 1 tablet (20 mg total) by mouth daily. Patient must schedule annual appointment for further refills first attempt 30 tablet 0   spironolactone  (ALDACTONE ) 25 MG tablet 12.5 mg.  1/2 tab daily     sulfaSALAzine  (AZULFIDINE ) 500 MG tablet Take 1,000 mg by mouth 2 (two) times daily.     traMADol  (ULTRAM ) 50 MG tablet Take 1 tablet (50 mg total) by mouth every 12 (twelve) hours as needed for moderate pain. (Patient taking differently: Take 50 mg by mouth as needed for moderate pain (pain score 4-6).) 60  tablet 2   traZODone  (DESYREL ) 50 MG tablet TAKE 1.5 TABLETS (75 MG TOTAL) BY MOUTH AT BEDTIME AS NEEDED. FOR SLEEP 135 tablet 5   TYRVAYA 0.03 MG/ACT SOLN      Vitamin D , Ergocalciferol , (DRISDOL ) 1.25 MG (50000 UNIT) CAPS capsule Take 50,000 Units by mouth.     White Petrolatum -Mineral Oil (GENTEAL TEARS NIGHT-TIME OP) Apply to eye at bedtime.     No current facility-administered medications for this visit.    Allergies  Allergen Reactions   Hydroxychloroquine      Other Reaction(s): cardiolmyopathy    Diagnoses:  Major depression  Plan of Care: Outpatient psychotherapy and medication management as needed. Initial Note: (With wife [name]). She says that she has hoped Lenny would get into therapy. He has Ataxic Tremor. He told Dr. Evonnie that he would like medication to just sit and do nothing. He says he always had golf to rely on and now cannot play. In December, he went n for a surgery to remove an avm from brain. Was told it was a routine surgery and was told that he needs to get it out. Was told it would be a short recovery and he would be back to work. 1 1/2 hour surgery turned into 6 hour surgery and he had complications. He had extra cerebral fluid and Dr. said it would absorb and just wait. He kept declining and by March, they went in to try and drain the extra fluid. He was in hospital for a week. Wife noticed that during the hospital stay, his speech declined and he was clearly weak. Nurses were trying to discharge him and she protested, but they sent him home anyway. He declined, and could not sit, stand, talk or walk and she ended up calling ambulance. In ER, she ws  told he had a stroke. He was in ER 24 hours before getting a room in Neuro area. Head of stroke team came in and said it was not a stroke, but complications from surgery. She was blamed by medical team for not speaking up before being initially discharged. He ended up in a rehab for 2 weeks and told them he needed another surgery. Was told he had hydrocephalus and need a shunt to drain liquid off his brain. She felt a lot of pressure from team that he needed surgery. She wanted a second opinion and had rouble getting it. Spoke to another Dr. and they decided to get the third surgery. He was in OT,PT and Speech Therapy. During therapy, he started having a tremor. They ended up being referred to Dr. Evonnie, who has now told him that treating his tremor is tricky and will take time.  Daymian is no longer in OT,PT speech therapy. He is supposed to be doing exercises on his own, but has not been emotionally able to ge himself to do it. He has persistent headaches as well. Just had a CAT Scan and awaiting the results. His headaches are worse and they are concerned. He also has rhumatory arthritis and it is flaring up because he had to stop meds. He has cardiomyopathy, macular degeneration on top of all of these other conditions.  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 He is still employed with insurance, but they fear he may be let go. He has been approved for disability and are working to get social security disability. She works part time. They have 2 daughters, one with diabetes and arthritis. She is heading to [employer] this Fall  and wants to work in endochrinology. Other daughter is 72 and is our introvert with lots of anxiety. She is in therapy and has mental health issues that is being treated. She will be a holiday representative at the middle college at [employer].  They are told that medicine will not control his condition, but they will have to try to retrain his brain. Suggested that they work to get back into OT and PT. She  has a hard time motivating him. He admits that he keeps things inside. She says he is not a good comunicator. He will not share things like if he is in pain or hurts himself.   Tx. Plan/Goals: Patient reports depressive symptoms secondary to the medical trauma he has suffered and the debilitating physical results. He is seeking therapy to resolve his feelings of helplessness and hopelessness. Also want assistance in improving his drive/motivation in daily activity. Will engage in therapy that utilizes behavioral approaches to mitigate symptoms along with insight oriented counseling. Will also incorporate some conjoint sessions with spouse as needed, given her integral involvement with his care. Patient agrees with plan for treatment. Goal Date is 12-26    Patient agrees to be been in the provider's office.    Session Note: Shervin says his wife and daughter are allowing him to do more things. 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 They are relaxing his restrictions on using sharp knives and supporting him doing more tasks around the house. He claims that his family wants him to be more self-sufficient.He has been driving their car around the neighborhood without wife and she declines to drive with him.                                                                                                                                                            CONI ALM KERNS, PhD  4:15p-5:00p 45 minutes                                                                                                     "

## 2024-05-25 ENCOUNTER — Ambulatory Visit: Admitting: Psychology

## 2024-05-25 DIAGNOSIS — F321 Major depressive disorder, single episode, moderate: Secondary | ICD-10-CM | POA: Diagnosis not present

## 2024-05-25 NOTE — Progress Notes (Signed)
 [facility] Counselor Initial Adult Exam  Name: Christopher Yu Date: 05/25/2024 MRN: [id number] DOB: May 21, 1960 PCP: Dwight Trula SQUIBB, MD    Guardian/Payee:  N/A    Paperwork requested: No   Reason for Visit Christopher Yu Problem: Adjustment to serious medical illness  Mental Status Exam: Appearance:   Casual     Behavior:  Appropriate  Motor:  Shuffling Gait  Speech/Language:   Slurred  Affect:  Depressed  Mood:  depressed  Thought process:  normal  Thought content:    WNL  Sensory/Perceptual disturbances:    WNL  Orientation:  oriented to person, place, and situation  Attention:  Good  Concentration:  Good  Memory:  WNL  Fund of knowledge:   Good  Insight:    Good  Judgment:   unknown  Impulse Control:  unknown    Reported Symptoms:  Sadness, helplessness and hopelessness  Risk Assessment: Danger to Self:  No Self-injurious Behavior: No Danger to Others: No Duty to Warn:no Physical Aggression / Violence:No  Access to Firearms a concern: Unknown Gang Involvement:No  Patient / guardian was educated about steps to take if suicide or homicide risk level increases between visits: n/a While future psychiatric events cannot be accurately predicted, the patient does not currently require acute inpatient psychiatric care and does not currently meet [place]  involuntary commitment criteria.  Substance Abuse History: Current substance abuse: No     Past Psychiatric History:   No previous psychological problems have been observed Outpatient Providers:N/A History of Psych Hospitalization: No  Psychological Testing: none   Abuse History:  Victim of:  N/A  Report needed: No. Victim of Neglect:No. Perpetrator of N/A  Witness / Exposure to Domestic Violence: No   Protective Services Involvement: No  Witness to  [employer] Violence:  No   Family History:  Family History  Problem Relation Age of Onset   Other Father        MVA   Lupus Sister     Living situation: the patient lives with their family  Sexual Orientation: Straight  Relationship Status: married  Name of spouse Christopher Yu If a parent, number of children / ages:Two girls, ages 37 and 82  Support Systems: spouse  Surveyor, Quantity Stress:  Yes   Income/Employment/Disability: Software Engineer: unknown  Educational History: Education: unknown  Oncologist: unknown  Any cultural differences that may affect / interfere with treatment:  not applicable   Recreation/Hobbies: golfing  Stressors: Financial difficulties   Health problems    Strengths: Family  Barriers:  severe physical limitations that compromises activity   Legal History: Pending legal issue / charges: The patient has no significant history of legal issues. History of legal issue / charges: N/A  Medical History/Surgical History: reviewed Past Medical History:  Diagnosis Date   Anxiety    Arthritis, rheumatoid (HCC)    dx 1988   CAD (coronary artery disease), native coronary artery    Mild LAD disease with calcification noted at cath 12//27/16    Cardiomyopathy (HCC)    Cardiomyopathy (HCC)    Chronic systolic heart failure (HCC) 05/01/2015   CVA (cerebral  vascular accident) (HCC)    03/24   Depression    Deviated nasal septum 06/25/2011   Headache    High cholesterol    Hyperlipidemia    Impingement syndrome of left shoulder 12/22/2017   Macular degeneration    Nausea and vomiting 08/28/2022   Rheumatoid aortitis    Sleep apnea    does not use cpap - UNABLE TO TOLERATE MASK   Sleep apnea in adult    deviated septum repaired, most recent sleep study was negative   Status post total right knee replacement 06/01/2015    Past Surgical History:  Procedure Laterality Date   ANKLE FUSION Right 05/05/2012    related to his arthritis   ANKLE FUSION Left 05/25/2013   related to his arthritis   ANKLE SURGERY Bilateral    APPLICATION OF CRANIAL NAVIGATION N/A 04/11/2022   Procedure: APPLICATION OF CRANIAL NAVIGATION;  Surgeon: Lanis Pupa, MD;  Location: [facility] OR;  Service: Neurosurgery;  Laterality: N/A;   APPLICATION OF CRANIAL NAVIGATION N/A 08/08/2022   Procedure: APPLICATION OF CRANIAL NAVIGATION;  Surgeon: Lanis Pupa, MD;  Location: [facility] OR;  Service: Neurosurgery;  Laterality: N/A;   BIOPSY  06/09/2023   Procedure: BIOPSY;  Surgeon: Saintclair Jasper, MD;  Location: [facility];  Service: Gastroenterology;;   CARDIAC CATHETERIZATION N/A 05/01/2015   Procedure: Left Heart Cath and Coronary Angiography;  Surgeon: Victory LELON Sharps, MD;  Location: [facility] INVASIVE CV LAB;  Service: Cardiovascular;  Laterality: N/A;   COLONOSCOPY WITH PROPOFOL  N/A 06/09/2023   Procedure: COLONOSCOPY WITH PROPOFOL ;  Surgeon: Saintclair Jasper, MD;  Location: [facility];  Service: Gastroenterology;  Laterality: N/A;   CRANIOTOMY N/A 04/11/2022   Procedure: STEREOTACTIC SUBOCCIPITAL CRANIOTOMY FOR RESECTION OF ARTERIO-VENOUS MALFORMATION;  Surgeon: Lanis Pupa, MD;  Location: [facility];  Service: Neurosurgery;  Laterality: N/A;   ESOPHAGOGASTRODUODENOSCOPY (EGD) WITH PROPOFOL  N/A 06/09/2023   Procedure: ESOPHAGOGASTRODUODENOSCOPY (EGD) WITH PROPOFOL ;  Surgeon: Saintclair Jasper, MD;  Location: [facility];  Service: Gastroenterology;  Laterality: N/A;   FRACTURE SURGERY     left femur fracture x 3   HEMOSTASIS CLIP PLACEMENT  06/09/2023   Procedure: HEMOSTASIS CLIP PLACEMENT;  Surgeon: Saintclair Jasper, MD;  Location: [facility];  Service: Gastroenterology;;   IR ANGIO EXTERNAL CAROTID SEL EXT CAROTID BILAT MOD SED  01/28/2022   IR ANGIO INTRA EXTRACRAN SEL INTERNAL CAROTID BILAT MOD SED  01/28/2022   IR ANGIO INTRA EXTRACRAN SEL INTERNAL CAROTID BILAT MOD SED  04/16/2022   IR ANGIO VERTEBRAL SEL VERTEBRAL BILAT MOD SED  01/28/2022   IR  ANGIO VERTEBRAL SEL VERTEBRAL BILAT MOD SED  04/16/2022   KNEE ARTHROSCOPY     right x 4   LAPAROSCOPIC REVISION VENTRICULAR-PERITONEAL (V-P) SHUNT N/A 08/08/2022   Procedure: LAPAROSCOPIC VENTRICULAR-PERITONEAL (V-P) SHUNT;  Surgeon: Vanderbilt Ned, MD;  Location: [facility];  Service: General;  Laterality: N/A;   LUMBAR LAMINECTOMY/DECOMPRESSION MICRODISCECTOMY N/A 07/18/2022   Procedure: Repair of Pseudomeningiocele posterior;  Surgeon: Lanis Pupa, MD;  Location: [facility];  Service: Neurosurgery;  Laterality: N/A;   NASAL SEPTOPLASTY W/ TURBINOPLASTY  06/25/2011   Procedure: NASAL SEPTOPLASTY WITH TURBINATE REDUCTION;  Surgeon: Alm Bouche, MD;  Location: [facility];  Service: ENT;  Laterality: Bilateral;   PLACEMENT OF LUMBAR DRAIN N/A 07/18/2022   Procedure: PLACEMENT OF LUMBAR DRAIN;  Surgeon: Lanis Pupa, MD;  Location: [facility];  Service: Neurosurgery;  Laterality: N/A;   POLYPECTOMY  06/09/2023   Procedure: POLYPECTOMY;  Surgeon: Saintclair Jasper, MD;  Location: [facility];  Service: Gastroenterology;;  TONSILLECTOMY     TOTAL KNEE ARTHROPLASTY Right 06/01/2015   Procedure: RIGHT TOTAL KNEE ARTHROPLASTY;  Surgeon: Lonni CINDERELLA Poli, MD;  Location: [facility];  Service: Orthopedics;  Laterality: Right;  Block+general   VENTRICULOPERITONEAL SHUNT Right 08/08/2022   Procedure: LAP ASSITED SHUNT INSERTION VENTRICULAR-PERITONEAL;  Surgeon: Lanis Pupa, MD;  Location: [facility];  Service: Neurosurgery;  Laterality: Right;    Medications: Current Outpatient Medications  Medication Sig Dispense Refill   acetaminophen  (TYLENOL ) 325 MG tablet Take 2 tablets (650 mg total) by mouth 3 (three) times daily.     alendronate (FOSAMAX) 10 MG tablet Take 10 mg by mouth daily before breakfast. Take with a full glass of water  on an empty stomach.     artificial tears (LACRILUBE) OINT ophthalmic ointment Place into both eyes 2 (two) times daily.     aspirin  EC 81 MG tablet Take 1 tablet (81 mg total) by  mouth daily. Swallow whole. 30 tablet 12   buPROPion (WELLBUTRIN XL) 150 MG 24 hr tablet Take 150 mg by mouth.     Calcium  200 MG TABS Calcium      calcium  carbonate (TUMS - DOSED IN MG ELEMENTAL CALCIUM ) 500 MG chewable tablet Chew 1 tablet (200 mg of elemental calcium  total) by mouth daily with supper.     carbidopa -levodopa  (SINEMET  IR) 25-100 MG tablet Take 1 tablet by mouth 3 (three) times daily. 7am/11am/4pm 270 tablet 1   Cholecalciferol (VITAMIN D3) 50 MCG (2000 UT) TABS Take 2,000 Units by mouth daily.     Cyanocobalamin (VITAMIN B12) 1000 MCG TBCR 3 tablets Orally once a week     famotidine (PEPCID) 40 MG tablet 1 tablet Orally Once a day for 30 days     ferrous sulfate 325 (65 FE) MG tablet Take 325 mg by mouth every other day.     folic acid  (FOLVITE ) 1 MG tablet Take 1 mg by mouth daily.     gabapentin  (NEURONTIN ) 300 MG capsule Take 1 capsule by mouth 2 (two) times daily.     Iron-Vit C-Vit B12-Folic Acid  100-250-0.025-1 MG TABS 1 tablet Orally Once a day     methotrexate  (RHEUMATREX) 2.5 MG tablet Take 20 mg by mouth once a week. Takes 8 tablets(20mg ) every Saturday.     Caution:Chemotherapy. Protect from light.     metoprolol  succinate (TOPROL -XL) 25 MG 24 hr tablet TAKE 1 TABLET DAILY 90 tablet 3   metoprolol  tartrate (LOPRESSOR ) 25 MG tablet Take 25 mg by mouth.     Multiple Vitamins-Minerals (MULTIVITAMIN ADULTS 50+) TABS Multivitamin     Multiple Vitamins-Minerals (PRESERVISION AREDS 2 PO) Take 2 capsules by mouth daily.     predniSONE  (STERAPRED UNI-PAK 21 TAB) 5 MG (21) TBPK tablet Take 5 mg by mouth every morning.     ramelteon (ROZEREM) 8 MG tablet Take 8 mg by mouth at bedtime as needed.     RITUXAN  500 MG/50ML injection See admin instructions. Every 6 months 2 dose infusion Ruxience      rosuvastatin  (CRESTOR ) 20 MG tablet Take 1 tablet (20 mg total) by mouth daily. Patient must schedule annual appointment for further refills first attempt 30 tablet 0   spironolactone   (ALDACTONE ) 25 MG tablet 12.5 mg. 1/2 tab daily     sulfaSALAzine  (AZULFIDINE ) 500 MG tablet Take 1,000 mg by mouth 2 (two) times daily.     traMADol  (ULTRAM ) 50 MG tablet Take 1 tablet (50 mg total) by mouth every 12 (twelve) hours as needed for moderate pain. (Patient taking differently:  Take 50 mg by mouth as needed for moderate pain (pain score 4-6).) 60 tablet 2   traZODone  (DESYREL ) 50 MG tablet TAKE 1.5 TABLETS (75 MG TOTAL) BY MOUTH AT BEDTIME AS NEEDED. FOR SLEEP 135 tablet 5   TYRVAYA 0.03 MG/ACT SOLN      Vitamin D , Ergocalciferol , (DRISDOL ) 1.25 MG (50000 UNIT) CAPS capsule Take 50,000 Units by mouth.     White Petrolatum -Mineral Oil (GENTEAL TEARS NIGHT-TIME OP) Apply to eye at bedtime.     No current facility-administered medications for this visit.    Allergies  Allergen Reactions   Hydroxychloroquine      Other Reaction(s): cardiolmyopathy    Diagnoses:  Major depression  Plan of Care: Outpatient psychotherapy and medication management as needed. Initial Note: (With wife Christopher Yu). She says that she has hoped Christopher Yu would get into therapy. He has Ataxic Tremor. He told Dr. Evonnie that he would like medication to just sit and do nothing. He says he always had golf to rely on and now cannot play. In December, he went n for a surgery to remove an avm from brain. Was told it was a routine surgery and was told that he needs to get it out. Was told it would be a short recovery and he would be back to work. 1 1/2 hour surgery turned into 6 hour surgery and he had complications. He had extra cerebral fluid and Dr. said it would absorb and just wait. He kept declining and by March, they went in to try and drain the extra fluid. He was in hospital for a week. Wife noticed that during the hospital stay, his speech declined and he was clearly weak. Nurses were trying to discharge him and she protested, but they sent him home anyway. He declined, and could not sit, stand, talk or walk and she ended  up calling ambulance. In ER, she ws told he had a stroke. He was in ER 24 hours before getting a room in Neuro area. Head of stroke team came in and said it was not a stroke, but complications from surgery. She was blamed by medical team for not speaking up before being initially discharged. He ended up in a rehab for 2 weeks and told them he needed another surgery. Was told he had hydrocephalus and need a shunt to drain liquid off his brain. She felt a lot of pressure from team that he needed surgery. She wanted a second opinion and had rouble getting it. Spoke to another Dr. and they decided to get the third surgery. He was in OT,PT and Speech Therapy. During therapy, he started having a tremor. They ended up being referred to Dr. Evonnie, who has now told him that treating his tremor is tricky and will take time.  Christopher Yu is no longer in OT,PT speech therapy. He is supposed to be doing exercises on his own, but has not been emotionally able to ge himself to do it. He has persistent headaches as well. Just had a CAT Scan and awaiting the results. His headaches are worse and they are concerned. He also has rhumatory arthritis and it is flaring up because he had to stop meds. He has cardiomyopathy, macular degeneration on top of all of these other conditions.  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 He is still employed with insurance, but they fear he may be let go. He has been approved for disability and are working to get social security disability. She works part time. They have  2 daughters, one with diabetes and arthritis. She is heading to [employer] this Fall and wants to work in research scientist (physical sciences). Other daughter is 60 and is our introvert with lots of anxiety. She is in therapy and has mental health issues that is being treated. She will be a holiday representative at the middle college at [employer].  They are told that medicine will not control his condition, but they will have to try to retrain his brain. Suggested that they  work to get back into OT and PT. She has a hard time motivating him. He admits that he keeps things inside. She says he is not a good comunicator. He will not share things like if he is in pain or hurts himself.   Tx. Plan/Goals: Patient reports depressive symptoms secondary to the medical trauma he has suffered and the debilitating physical results. He is seeking therapy to resolve his feelings of helplessness and hopelessness. Also want assistance in improving his drive/motivation in daily activity. Will engage in therapy that utilizes behavioral approaches to mitigate symptoms along with insight oriented counseling. Will also incorporate some conjoint sessions with spouse as needed, given her integral involvement with his care. Patient agrees with plan for treatment. Goal Date is 12-26    Patient agrees to be seen for a video (Caregility) session and understands limits of the platform. He is at home and provider is at office.    Session Note: Christopher Yu says his daughter has been in hospital for the past few days due to pneumonia. He states that the doctor doesn't know how she got this and is unclear about the course of treatment. He is unsure whether he should visit her in hospital because he doesn't want to be a distraction. Unclear why he feels that he could potentially be a distraction. He talked about preparing for the upcoming storm. He is feeling ready and confused why locals struggle to manage in the ice/snow storm.                                                                                                                                                              CONI ALM KERNS, PhD  4:15p-5:00p 45 minutes                                                                                                     "

## 2024-06-01 ENCOUNTER — Ambulatory Visit: Admitting: Psychology

## 2024-06-01 DIAGNOSIS — F321 Major depressive disorder, single episode, moderate: Secondary | ICD-10-CM | POA: Diagnosis not present

## 2024-06-01 NOTE — Progress Notes (Signed)
 [facility] Counsel[facility] Initial Adult Exam  Name: Christopher Yu Date: 06/01/2024 MRN: [id number] DOB: 01-Mar-1961 PCP: Dwight Trula SQUIBB, MD    Guardian/Payee:  N/A    Paperwork requested: No   Reason for Visit Christopher Yu Problem: Adjustment to serious medical illness  Mental Status Exam: Appearance:   Casual     Behavior:  Appropriate  Motor:  Shuffling Gait  Speech/Language:   Slurred  Affect:  Depressed  Mood:  depressed  Thought process:  normal  Thought content:    WNL  Sensory/Perceptual disturbances:    WNL  Orientation:  oriented to person, place, and situation  Attention:  Good  Concentration:  Good  Memory:  WNL  Fund of knowledge:   Good  Insight:    Good  Judgment:   unknown  Impulse Control:  unknown    Reported Symptoms:  Sadness, helplessness and hopelessness  Risk Assessment: Danger to Self:  No Self-injurious Behavior: No Danger to Others: No Duty to Warn:no Physical Aggression / Violence:No  Access to Firearms a concern: Unknown Gang Involvement:No  Patient / guardian was educated about steps to take if suicide or homicide risk level increases between visits: n/a While future psychiatric events cannot be accurately predicted, the patient does not currently require acute inpatient psychiatric care and does not currently meet [place]  involuntary commitment criteria.  Substance Abuse History: Current substance abuse: No     Past Psychiatric History:   No previous psychological problems have been observed Outpatient Providers:N/A History of Psych Hospitalization: No  Psychological Testing: none   Abuse History:  Victim of:  N/A  Report needed: No. Victim of Neglect:No. Perpetrator of N/A  Witness / Exposure to Domestic Violence: No   Protective Services  Involvement: No  Witness to [employer] Violence:  No   Family History:  Family History  Problem Relation Age of Onset   Other Father        MVA   Lupus Sister     Living situation: the patient lives with their family  Sexual Orientation: Straight  Relationship Status: married  Name of spouse [name] If a parent, number of children / ages:Two girls, ages 61 and 49  Support Systems: spouse  Surveyor, Quantity Stress:  Yes   Income/Employment/Disability: Software Engineer: unknown  Educational History: Education: unknown  Oncologist: unknown  Any cultural differences that may affect / interfere with treatment:  not applicable   Recreation/Hobbies: golfing  Stressors: Financial difficulties   Health problems    Strengths: Family  Barriers:  severe physical limitations that compromises activity   Legal History: Pending legal issue / charges: The patient has no significant history of legal issues. History of legal issue / charges: N/A  Medical History/Surgical History: reviewed Past Medical History:  Diagnosis Date   Anxiety    Arthritis, rheumatoid (HCC)    dx 1988   CAD (coronary artery disease), native coronary artery    Mild LAD disease with calcification noted at cath 12//27/16    Cardiomyopathy (HCC)  Cardiomyopathy (HCC)    Chronic systolic heart failure (HCC) 05/01/2015   CVA (cerebral vascular accident) (HCC)    03/24   Depression    Deviated nasal septum 06/25/2011   Headache    High cholesterol    Hyperlipidemia    Impingement syndrome of left shoulder 12/22/2017   Macular degeneration    Nausea and vomiting 08/28/2022   Rheumatoid aortitis    Sleep apnea    does not use cpap - UNABLE TO TOLERATE MASK   Sleep apnea in adult    deviated septum repaired, most recent sleep study was negative   Status post total right knee replacement 06/01/2015    Past Surgical History:  Procedure Laterality Date   ANKLE  FUSION Right 05/05/2012   related to his arthritis   ANKLE FUSION Left 05/25/2013   related to his arthritis   ANKLE SURGERY Bilateral    APPLICATION OF CRANIAL NAVIGATION N/A 04/11/2022   Procedure: APPLICATION OF CRANIAL NAVIGATION;  Surgeon: Lanis Pupa, MD;  Location: [facility] OR;  Service: Neurosurgery;  Laterality: N/A;   APPLICATION OF CRANIAL NAVIGATION N/A 08/08/2022   Procedure: APPLICATION OF CRANIAL NAVIGATION;  Surgeon: Lanis Pupa, MD;  Location: [facility] OR;  Service: Neurosurgery;  Laterality: N/A;   BIOPSY  06/09/2023   Procedure: BIOPSY;  Surgeon: Saintclair Jasper, MD;  Location: [facility];  Service: Gastroenterology;;   CARDIAC CATHETERIZATION N/A 05/01/2015   Procedure: Left Heart Cath and Coronary Angiography;  Surgeon: Victory LELON Sharps, MD;  Location: [facility] INVASIVE CV LAB;  Service: Cardiovascular;  Laterality: N/A;   COLONOSCOPY WITH PROPOFOL  N/A 06/09/2023   Procedure: COLONOSCOPY WITH PROPOFOL ;  Surgeon: Saintclair Jasper, MD;  Location: [facility];  Service: Gastroenterology;  Laterality: N/A;   CRANIOTOMY N/A 04/11/2022   Procedure: STEREOTACTIC SUBOCCIPITAL CRANIOTOMY FOR RESECTION OF ARTERIO-VENOUS MALFORMATION;  Surgeon: Lanis Pupa, MD;  Location: [facility];  Service: Neurosurgery;  Laterality: N/A;   ESOPHAGOGASTRODUODENOSCOPY (EGD) WITH PROPOFOL  N/A 06/09/2023   Procedure: ESOPHAGOGASTRODUODENOSCOPY (EGD) WITH PROPOFOL ;  Surgeon: Saintclair Jasper, MD;  Location: [facility];  Service: Gastroenterology;  Laterality: N/A;   FRACTURE SURGERY     left femur fracture x 3   HEMOSTASIS CLIP PLACEMENT  06/09/2023   Procedure: HEMOSTASIS CLIP PLACEMENT;  Surgeon: Saintclair Jasper, MD;  Location: [facility];  Service: Gastroenterology;;   IR ANGIO EXTERNAL CAROTID SEL EXT CAROTID BILAT MOD SED  01/28/2022   IR ANGIO INTRA EXTRACRAN SEL INTERNAL CAROTID BILAT MOD SED  01/28/2022   IR ANGIO INTRA EXTRACRAN SEL INTERNAL CAROTID BILAT MOD SED  04/16/2022   IR ANGIO VERTEBRAL SEL VERTEBRAL BILAT  MOD SED  01/28/2022   IR ANGIO VERTEBRAL SEL VERTEBRAL BILAT MOD SED  04/16/2022   KNEE ARTHROSCOPY     right x 4   LAPAROSCOPIC REVISION VENTRICULAR-PERITONEAL (V-P) SHUNT N/A 08/08/2022   Procedure: LAPAROSCOPIC VENTRICULAR-PERITONEAL (V-P) SHUNT;  Surgeon: Vanderbilt Ned, MD;  Location: [facility];  Service: General;  Laterality: N/A;   LUMBAR LAMINECTOMY/DECOMPRESSION MICRODISCECTOMY N/A 07/18/2022   Procedure: Repair of Pseudomeningiocele posterior;  Surgeon: Lanis Pupa, MD;  Location: [facility];  Service: Neurosurgery;  Laterality: N/A;   NASAL SEPTOPLASTY W/ TURBINOPLASTY  06/25/2011   Procedure: NASAL SEPTOPLASTY WITH TURBINATE REDUCTION;  Surgeon: Alm Bouche, MD;  Location: [facility] OR;  Service: ENT;  Laterality: Bilateral;   PLACEMENT OF LUMBAR DRAIN N/A 07/18/2022   Procedure: PLACEMENT OF LUMBAR DRAIN;  Surgeon: Lanis Pupa, MD;  Location: [facility];  Service: Neurosurgery;  Laterality: N/A;   POLYPECTOMY  06/09/2023  Procedure: POLYPECTOMY;  Surgeon: Saintclair Jasper, MD;  Location: [facility];  Service: Gastroenterology;;   TONSILLECTOMY     TOTAL KNEE ARTHROPLASTY Right 06/01/2015   Procedure: RIGHT TOTAL KNEE ARTHROPLASTY;  Surgeon: Lonni CINDERELLA Poli, MD;  Location: [facility];  Service: Orthopedics;  Laterality: Right;  Block+general   VENTRICULOPERITONEAL SHUNT Right 08/08/2022   Procedure: LAP ASSITED SHUNT INSERTION VENTRICULAR-PERITONEAL;  Surgeon: Lanis Pupa, MD;  Location: [facility];  Service: Neurosurgery;  Laterality: Right;    Medications: Current Outpatient Medications  Medication Sig Dispense Refill   acetaminophen  (TYLENOL ) 325 MG tablet Take 2 tablets (650 mg total) by mouth 3 (three) times daily.     alendronate (FOSAMAX) 10 MG tablet Take 10 mg by mouth daily before breakfast. Take with a full glass of water  on an empty stomach.     artificial tears (LACRILUBE) OINT ophthalmic ointment Place into both eyes 2 (two) times daily.     aspirin  EC 81 MG tablet Take  1 tablet (81 mg total) by mouth daily. Swallow whole. 30 tablet 12   buPROPion (WELLBUTRIN XL) 150 MG 24 hr tablet Take 150 mg by mouth.     Calcium  200 MG TABS Calcium      calcium  carbonate (TUMS - DOSED IN MG ELEMENTAL CALCIUM ) 500 MG chewable tablet Chew 1 tablet (200 mg of elemental calcium  total) by mouth daily with supper.     carbidopa -levodopa  (SINEMET  IR) 25-100 MG tablet Take 1 tablet by mouth 3 (three) times daily. 7am/11am/4pm 270 tablet 1   Cholecalciferol (VITAMIN D3) 50 MCG (2000 UT) TABS Take 2,000 Units by mouth daily.     Cyanocobalamin (VITAMIN B12) 1000 MCG TBCR 3 tablets Orally once a week     famotidine (PEPCID) 40 MG tablet 1 tablet Orally Once a day for 30 days     ferrous sulfate 325 (65 FE) MG tablet Take 325 mg by mouth every other day.     folic acid  (FOLVITE ) 1 MG tablet Take 1 mg by mouth daily.     gabapentin  (NEURONTIN ) 300 MG capsule Take 1 capsule by mouth 2 (two) times daily.     Iron-Vit C-Vit B12-Folic Acid  100-250-0.025-1 MG TABS 1 tablet Orally Once a day     methotrexate  (RHEUMATREX) 2.5 MG tablet Take 20 mg by mouth once a week. Takes 8 tablets(20mg ) every Saturday.     Caution:Chemotherapy. Protect from light.     metoprolol  succinate (TOPROL -XL) 25 MG 24 hr tablet TAKE 1 TABLET DAILY 90 tablet 3   metoprolol  tartrate (LOPRESSOR ) 25 MG tablet Take 25 mg by mouth.     Multiple Vitamins-Minerals (MULTIVITAMIN ADULTS 50+) TABS Multivitamin     Multiple Vitamins-Minerals (PRESERVISION AREDS 2 PO) Take 2 capsules by mouth daily.     predniSONE  (STERAPRED UNI-PAK 21 TAB) 5 MG (21) TBPK tablet Take 5 mg by mouth every morning.     ramelteon (ROZEREM) 8 MG tablet Take 8 mg by mouth at bedtime as needed.     RITUXAN  500 MG/50ML injection See admin instructions. Every 6 months 2 dose infusion Ruxience      rosuvastatin  (CRESTOR ) 20 MG tablet Take 1 tablet (20 mg total) by mouth daily. Patient must schedule annual appointment for further refills first attempt 30  tablet 0   spironolactone  (ALDACTONE ) 25 MG tablet 12.5 mg. 1/2 tab daily     sulfaSALAzine  (AZULFIDINE ) 500 MG tablet Take 1,000 mg by mouth 2 (two) times daily.     traMADol  (ULTRAM ) 50 MG tablet Take 1 tablet (50  mg total) by mouth every 12 (twelve) hours as needed for moderate pain. (Patient taking differently: Take 50 mg by mouth as needed for moderate pain (pain score 4-6).) 60 tablet 2   traZODone  (DESYREL ) 50 MG tablet TAKE 1.5 TABLETS (75 MG TOTAL) BY MOUTH AT BEDTIME AS NEEDED. FOR SLEEP 135 tablet 5   TYRVAYA 0.03 MG/ACT SOLN      Vitamin D , Ergocalciferol , (DRISDOL ) 1.25 MG (50000 UNIT) CAPS capsule Take 50,000 Units by mouth.     White Petrolatum -Mineral Oil (GENTEAL TEARS NIGHT-TIME OP) Apply to eye at bedtime.     No current facility-administered medications for this visit.    Allergies  Allergen Reactions   Hydroxychloroquine      Other Reaction(s): cardiolmyopathy    Diagnoses:  Major depression  Plan of Care: Outpatient psychotherapy and medication management as needed. Initial Note: (With wife [name]). She says that she has hoped Christopher Yu would get into therapy. He has Ataxic Tremor. He told Dr. Evonnie that he would like medication to just sit and do nothing. He says he always had golf to rely on and now cannot play. In December, he went n for a surgery to remove an avm from brain. Was told it was a routine surgery and was told that he needs to get it out. Was told it would be a short recovery and he would be back to work. 1 1/2 hour surgery turned into 6 hour surgery and he had complications. He had extra cerebral fluid and Dr. said it would absorb and just wait. He kept declining and by March, they went in to try and drain the extra fluid. He was in hospital for a week. Wife noticed that during the hospital stay, his speech declined and he was clearly weak. Nurses were trying to discharge him and she protested, but they sent him home anyway. He declined, and could not sit, stand,  talk or walk and she ended up calling ambulance. In ER, she ws told he had a stroke. He was in ER 24 hours before getting a room in Neuro area. Head of stroke team came in and said it was not a stroke, but complications from surgery. She was blamed by medical team for not speaking up before being initially discharged. He ended up in a rehab for 2 weeks and told them he needed another surgery. Was told he had hydrocephalus and need a shunt to drain liquid off his brain. She felt a lot of pressure from team that he needed surgery. She wanted a second opinion and had rouble getting it. Spoke to another Dr. and they decided to get the third surgery. He was in OT,PT and Speech Therapy. During therapy, he started having a tremor. They ended up being referred to Dr. Evonnie, who has now told him that treating his tremor is tricky and will take time.  Christopher Yu is no longer in OT,PT speech therapy. He is supposed to be doing exercises on his own, but has not been emotionally able to ge himself to do it. He has persistent headaches as well. Just had a CAT Scan and awaiting the results. His headaches are worse and they are concerned. He also has rhumatory arthritis and it is flaring up because he had to stop meds. He has cardiomyopathy, macular degeneration on top of all of these other conditions.  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 He is still employed with insurance, but they fear he may be let go. He has been approved  for disability and are working to get social security disability. She works part time. They have 2 daughters, one with diabetes and arthritis. She is heading to [employer] this Fall and wants to work in research scientist (physical sciences). Other daughter is 82 and is our introvert with lots of anxiety. She is in therapy and has mental health issues that is being treated. She will be a holiday representative at the middle college at [employer].  They are told that medicine will not control his condition, but they will have to try to retrain his  brain. Suggested that they work to get back into OT and PT. She has a hard time motivating him. He admits that he keeps things inside. She says he is not a good comunicator. He will not share things like if he is in pain or hurts himself.   Tx. Plan/Goals: Patient reports depressive symptoms secondary to the medical trauma he has suffered and the debilitating physical results. He is seeking therapy to resolve his feelings of helplessness and hopelessness. Also want assistance in improving his drive/motivation in daily activity. Will engage in therapy that utilizes behavioral approaches to mitigate symptoms along with insight oriented counseling. Will also incorporate some conjoint sessions with spouse as needed, given her integral involvement with his care. Patient agrees with plan for treatment. Goal Date is 12-26    Patient agrees to be seen for a video (Caregility) session and understands limits of the platform. He is at home and provider is at office.    Session Note: He states that his daughter is doing better. He talked about his rehab and the desire to get to the point that he can again return to playing some golf. We talked about the video Dr. Evonnie asked him to make for the doctor that that might be able to help treat his symptoms. He says he has been reluctant to ask wife for help. It is more about him than her, but he struggles to reach out. Discussed the need for him to let go and be willing to depend on others. This has always been a problem for Christopher Yu. Agrees to consider, but not yet ready to jump in to asking for support.                                                                                                                                                                CONI ALM KERNS, PhD  4:15p-5:00p 45  minutes                                                                                                     "

## 2024-06-08 ENCOUNTER — Ambulatory Visit: Admitting: Psychology

## 2024-06-15 ENCOUNTER — Ambulatory Visit: Admitting: Psychology

## 2024-06-22 ENCOUNTER — Ambulatory Visit: Admitting: Psychology

## 2024-06-29 ENCOUNTER — Ambulatory Visit: Admitting: Psychology

## 2024-07-06 ENCOUNTER — Ambulatory Visit: Admitting: Psychology

## 2024-07-13 ENCOUNTER — Ambulatory Visit: Admitting: Psychology

## 2024-07-20 ENCOUNTER — Ambulatory Visit: Admitting: Psychology

## 2024-07-27 ENCOUNTER — Ambulatory Visit: Admitting: Psychology

## 2024-08-03 ENCOUNTER — Ambulatory Visit: Admitting: Psychology

## 2024-08-10 ENCOUNTER — Ambulatory Visit: Admitting: Psychology

## 2024-08-17 ENCOUNTER — Ambulatory Visit: Admitting: Psychology

## 2024-08-24 ENCOUNTER — Ambulatory Visit: Admitting: Psychology

## 2024-08-31 ENCOUNTER — Ambulatory Visit: Admitting: Psychology

## 2024-10-06 ENCOUNTER — Ambulatory Visit: Admitting: Neurology
